# Patient Record
Sex: Female | Born: 1970 | Race: White | Hispanic: No | Marital: Single | Smoking: Former smoker
Health system: Southern US, Community
[De-identification: ages and names within clinical notes are randomized; demographics above are authoritative.]

## PROBLEM LIST (undated history)

## (undated) ENCOUNTER — Ambulatory Visit: Admission: EM | Disposition: A | Discharge status: Home / Self Care | Payer: Medicare Other

## (undated) DIAGNOSIS — R600 Localized edema: Secondary | ICD-10-CM

## (undated) DIAGNOSIS — I499 Cardiac arrhythmia, unspecified: Secondary | ICD-10-CM

## (undated) DIAGNOSIS — F410 Panic disorder [episodic paroxysmal anxiety] without agoraphobia: Secondary | ICD-10-CM

## (undated) DIAGNOSIS — E785 Hyperlipidemia, unspecified: Secondary | ICD-10-CM

## (undated) DIAGNOSIS — M797 Fibromyalgia: Secondary | ICD-10-CM

## (undated) DIAGNOSIS — M791 Myalgia, unspecified site: Secondary | ICD-10-CM

## (undated) DIAGNOSIS — F319 Bipolar disorder, unspecified: Secondary | ICD-10-CM

## (undated) DIAGNOSIS — E039 Hypothyroidism, unspecified: Secondary | ICD-10-CM

## (undated) DIAGNOSIS — N1831 Chronic kidney disease, stage 3a: Secondary | ICD-10-CM

## (undated) DIAGNOSIS — E119 Type 2 diabetes mellitus without complications: Secondary | ICD-10-CM

## (undated) DIAGNOSIS — F4310 Post-traumatic stress disorder, unspecified: Secondary | ICD-10-CM

## (undated) DIAGNOSIS — M5136 Other intervertebral disc degeneration, lumbar region: Secondary | ICD-10-CM

## (undated) DIAGNOSIS — N289 Disorder of kidney and ureter, unspecified: Secondary | ICD-10-CM

## (undated) DIAGNOSIS — N281 Cyst of kidney, acquired: Secondary | ICD-10-CM

## (undated) DIAGNOSIS — M79605 Pain in left leg: Secondary | ICD-10-CM

## (undated) DIAGNOSIS — M810 Age-related osteoporosis without current pathological fracture: Secondary | ICD-10-CM

## (undated) DIAGNOSIS — R51 Headache: Secondary | ICD-10-CM

## (undated) DIAGNOSIS — K859 Acute pancreatitis without necrosis or infection, unspecified: Secondary | ICD-10-CM

## (undated) DIAGNOSIS — D649 Anemia, unspecified: Secondary | ICD-10-CM

## (undated) DIAGNOSIS — M1990 Unspecified osteoarthritis, unspecified site: Secondary | ICD-10-CM

## (undated) DIAGNOSIS — G2581 Restless legs syndrome: Secondary | ICD-10-CM

## (undated) DIAGNOSIS — I2510 Atherosclerotic heart disease of native coronary artery without angina pectoris: Secondary | ICD-10-CM

## (undated) DIAGNOSIS — N3281 Overactive bladder: Secondary | ICD-10-CM

## (undated) DIAGNOSIS — R519 Headache, unspecified: Secondary | ICD-10-CM

## (undated) DIAGNOSIS — I10 Essential (primary) hypertension: Secondary | ICD-10-CM

## (undated) DIAGNOSIS — G4730 Sleep apnea, unspecified: Secondary | ICD-10-CM

## (undated) DIAGNOSIS — G459 Transient cerebral ischemic attack, unspecified: Secondary | ICD-10-CM

## (undated) DIAGNOSIS — E079 Disorder of thyroid, unspecified: Secondary | ICD-10-CM

## (undated) DIAGNOSIS — Z8489 Family history of other specified conditions: Secondary | ICD-10-CM

## (undated) DIAGNOSIS — I639 Cerebral infarction, unspecified: Secondary | ICD-10-CM

## (undated) DIAGNOSIS — I219 Acute myocardial infarction, unspecified: Secondary | ICD-10-CM

## (undated) DIAGNOSIS — F329 Major depressive disorder, single episode, unspecified: Secondary | ICD-10-CM

## (undated) DIAGNOSIS — F419 Anxiety disorder, unspecified: Secondary | ICD-10-CM

## (undated) DIAGNOSIS — G9050 Complex regional pain syndrome I, unspecified: Secondary | ICD-10-CM

## (undated) DIAGNOSIS — G894 Chronic pain syndrome: Secondary | ICD-10-CM

## (undated) DIAGNOSIS — J449 Chronic obstructive pulmonary disease, unspecified: Secondary | ICD-10-CM

## (undated) DIAGNOSIS — I209 Angina pectoris, unspecified: Secondary | ICD-10-CM

## (undated) DIAGNOSIS — T1491XA Suicide attempt, initial encounter: Secondary | ICD-10-CM

## (undated) DIAGNOSIS — F32A Depression, unspecified: Secondary | ICD-10-CM

## (undated) DIAGNOSIS — J45909 Unspecified asthma, uncomplicated: Secondary | ICD-10-CM

## (undated) DIAGNOSIS — M51369 Other intervertebral disc degeneration, lumbar region without mention of lumbar back pain or lower extremity pain: Secondary | ICD-10-CM

## (undated) DIAGNOSIS — T50901A Poisoning by unspecified drugs, medicaments and biological substances, accidental (unintentional), initial encounter: Secondary | ICD-10-CM

## (undated) DIAGNOSIS — I509 Heart failure, unspecified: Secondary | ICD-10-CM

## (undated) DIAGNOSIS — K219 Gastro-esophageal reflux disease without esophagitis: Secondary | ICD-10-CM

## (undated) HISTORY — DX: Cerebral infarction, unspecified: I63.9

## (undated) HISTORY — PX: COLONOSCOPY: SHX174

## (undated) HISTORY — DX: Unspecified asthma, uncomplicated: J45.909

## (undated) HISTORY — DX: Bipolar disorder, unspecified: F31.9

## (undated) HISTORY — PX: CARDIAC CATHETERIZATION: SHX172

## (undated) HISTORY — PX: ESOPHAGOGASTRODUODENOSCOPY: SHX1529

## (undated) HISTORY — DX: Headache, unspecified: R51.9

## (undated) HISTORY — DX: Sleep apnea, unspecified: G47.30

## (undated) HISTORY — DX: Hyperlipidemia, unspecified: E78.5

## (undated) HISTORY — PX: ABDOMINAL HYSTERECTOMY: SHX81

## (undated) HISTORY — DX: Chronic obstructive pulmonary disease, unspecified: J44.9

## (undated) HISTORY — PX: OTHER SURGICAL HISTORY: SHX169

## (undated) HISTORY — DX: Heart failure, unspecified: I50.9

## (undated) HISTORY — DX: Poisoning by unspecified drugs, medicaments and biological substances, accidental (unintentional), initial encounter: T50.901A

## (undated) HISTORY — DX: Complex regional pain syndrome I, unspecified: G90.50

## (undated) HISTORY — DX: Anxiety disorder, unspecified: F41.9

## (undated) HISTORY — DX: Overactive bladder: N32.81

## (undated) HISTORY — PX: ELBOW FRACTURE SURGERY: SHX616

## (undated) HISTORY — DX: Age-related osteoporosis without current pathological fracture: M81.0

## (undated) HISTORY — DX: Gastro-esophageal reflux disease without esophagitis: K21.9

## (undated) HISTORY — DX: Disorder of kidney and ureter, unspecified: N28.9

## (undated) HISTORY — DX: Headache: R51

## (undated) HISTORY — DX: Type 2 diabetes mellitus without complications: E11.9

## (undated) HISTORY — PX: TONSILLECTOMY: SUR1361

## (undated) HISTORY — DX: Anemia, unspecified: D64.9

## (undated) HISTORY — DX: Pain in left leg: M79.605

## (undated) HISTORY — DX: Myalgia, unspecified site: M79.10

## (undated) NOTE — *Deleted (*Deleted)
CARDIOLOGY CONSULT NOTE               Patient ID: Marie Clarke MRN: [id number] DOB/AGE: 01-29-1971 60 y.o.  Admit date: 12/26/2019 Referring Physician *** Primary Physician *** Primary Cardiologist *** Reason for Consultation ***  HPI: ***  Review of systems complete and found to be negative unless listed above     Past Medical History:  Diagnosis Date  . Anemia   . Anginal pain (HCC)   . Anxiety   . Arthritis   . Asthma   . Bilateral lower extremity edema   . Bipolar disorder (HCC)   . CAD (coronary artery disease) unk  . CHF (congestive heart failure) (HCC)   . Chronic pain syndrome   . COPD (chronic obstructive pulmonary disease) (HCC)    emphysema  . DDD (degenerative disc disease), lumbar   . Depression unk  . Diabetes mellitus without complication (HCC)   . Diabetes mellitus, type II (HCC)   . Drug overdose 2015   after dad died  . Dysrhythmia    history of tachy arrythmias  . Fibromyalgia   . GERD (gastroesophageal reflux disease)   . Headache    chronic migraines  . Hyperlipidemia   . Hypertension   . Hypothyroidism   . Left leg pain 04/29/2014  . MI (myocardial infarction) (HCC) 2007  . Muscle ache 09/16/2014  . Osteoporosis   . Overactive bladder   . Pancreatitis unk  . Panic attacks   . PTSD (post-traumatic stress disorder)   . Reflex sympathetic dystrophy   . Renal cyst, left   . Renal insufficiency    ckd stage iii  . Restless legs syndrome   . Sleep apnea 06/2019   uses cpap  . Sleep apnea   . Stroke (HCC) 2010   TIA x 2. no residual deficits  . Suicide attempt [facility])    after father died.  . Thyroid disease    thyroid nodule  . TIA (transient ischemic attack) unk  . TIA (transient ischemic attack)     Past Surgical History:  Procedure Laterality Date  . ABDOMINAL HYSTERECTOMY    . CARDIAC CATHETERIZATION    . HERNIA REPAIR  07/15/2017   [facility]  . lumbar facet     several  . prolapse rectum surgery N/A July 2016  . THORACIC  LAMINECTOMY FOR SPINAL CORD STIMULATOR N/A 07/27/2019   Procedure: THORACIC SPINAL CORD STIMULATOR PLACEMENT WITH RIGHT FLANK PULSE GENERATOR;  Surgeon: Lucy Chris, MD;  Location: [facility] ORS;  Service: Neurosurgery;  Laterality: N/A;  . TONSILLECTOMY      Medications Prior to Admission  Medication Sig Dispense Refill Last Dose  . Albuterol Sulfate (PROAIR RESPICLICK IN) Inhale 1-2 puffs into the lungs every 6 (six) hours as needed (wheezing or shortness of breath).     Marland Kitchen atorvastatin (LIPITOR) 40 MG tablet Take 40 mg by mouth at bedtime.      . B Complex-C (B-COMPLEX WITH VITAMIN C) tablet Take 1 tablet by mouth daily.      . benztropine (COGENTIN) 0.5 MG tablet TAKE 1 TABLET BY MOUTH TWICE A DAY FOR ABNORMAL MOVEMENTS. 60 tablet 1   . busPIRone (BUSPAR) 15 MG tablet Take 1 tablet (15 mg total) by mouth 2 (two) times daily. 180 tablet 0   . Cholecalciferol (VITAMIN D3) 125 MCG (5000 UT) TABS Take 1 tablet by mouth daily.      Marland Kitchen darifenacin (ENABLEX) 7.5 MG 24 hr tablet Take 7.5 mg by mouth at bedtime.  Use as needed for OAB     . desvenlafaxine (PRISTIQ) 50 MG 24 hr tablet Take 1 tablet (50 mg total) by mouth at bedtime. 90 tablet 0   . dicyclomine (BENTYL) 10 MG capsule Take 10 mg by mouth 3 (three) times daily.      . famotidine (PEPCID) 20 MG tablet Take 40 mg by mouth 2 (two) times daily.      . fluticasone (FLONASE) 50 MCG/ACT nasal spray Place 2 sprays into both nostrils daily as needed.      . gabapentin (NEURONTIN) 600 MG tablet Take 1 tablet (600 mg total) by mouth 3 (three) times daily. 90 tablet 0   . gabapentin (NEURONTIN) 800 MG tablet Take 1 tablet (800 mg total) by mouth at bedtime. 40 tablet 0   . glipiZIDE (GLUCOTROL XL) 10 MG 24 hr tablet Take 10 mg by mouth daily with breakfast.      . hydrOXYzine (ATARAX/VISTARIL) 25 MG tablet TAKE 1 TABLET BY MOUTH EVERY 6 HOURS AS NEEDED (Patient taking differently: Take 25 mg by mouth every 6 (six) hours as needed. ) 360 tablet 0   .  levothyroxine (SYNTHROID) 175 MCG tablet Take 175 mcg by mouth daily before breakfast.     . metoprolol succinate (TOPROL-XL) 25 MG 24 hr tablet Take 25 mg by mouth daily. Takes in the evening     . montelukast (SINGULAIR) 10 MG tablet Take 10 mg by mouth daily. Takes in the afternoon     . nitroGLYCERIN (NITROSTAT) 0.4 MG SL tablet Place 0.4 mg under the tongue every 5 (five) minutes as needed for chest pain.      Marland Kitchen omeprazole (PRILOSEC) 40 MG capsule Take 40 mg by mouth daily.      . promethazine (PHENERGAN) 25 MG tablet Take 25 mg by mouth every 6 (six) hours as needed.      Marland Kitchen QUEtiapine (SEROQUEL) 100 MG tablet Take 1 tablet (100 mg total) by mouth at bedtime. To be combined with 25 mg at bedtime 90 tablet 0   . QUEtiapine (SEROQUEL) 25 MG tablet Take 1 tablet (25 mg total) by mouth as directed. Start taking 1 tablet  with 100 mg at bedtime and 1 tablet daily as needed for agitation only 180 tablet 0   . rizatriptan (MAXALT) 10 MG tablet Take 1 tablet (10 mg total) by mouth as needed. 10 tablet 5   . sucralfate (CARAFATE) 1 g tablet Take 1 g by mouth 4 (four) times daily.     Marland Kitchen torsemide (DEMADEX) 20 MG tablet Take 1 tablet (20 mg total) by mouth 2 (two) times daily. 60 tablet 0   . TRELEGY ELLIPTA 100-62.5-25 MCG/INH AEPB Inhale 1 puff into the lungs daily. Takes at night (Patient not taking: Reported on 12/03/2019)     . TRULICITY 1.5 MG/0.5ML SOPN Inject 1.5 mg into the skin once a week. (Patient not taking: Reported on 12/03/2019)     . VENTOLIN HFA 108 (90 Base) MCG/ACT inhaler Inhale 1-2 puffs into the lungs every 6 (six) hours as needed for wheezing or shortness of breath.  (Patient not taking: Reported on 12/03/2019)     . vitamin E 400 UNIT capsule 400 Units daily.       Social History   Socioeconomic History  . Marital status: Single    Spouse name: Not on file  . Number of children: 0  . Years of education: Not on file  . Highest education level: High school graduate  Occupational History    Comment: not employed  Tobacco Use  . Smoking status: Former Smoker    Packs/day: 0.50    Types: Cigarettes    Quit date: 09/01/2018    Years since quitting: 1.3  . Smokeless tobacco: Never Used  . Tobacco comment: Patient quit smoking 09/01/2018  Vaping Use  . Vaping Use: Never used  Substance and Sexual Activity  . Alcohol use: No    Alcohol/week: 0.0 standard drinks  . Drug use: No  . Sexual activity: Not Currently  Other Topics Concern  . Not on file  Social History Narrative   Patient lives with aunt and uncle.   Social Determinants of Health   Financial Resource Strain:   . Difficulty of Paying Living Expenses: Not on file  Food Insecurity:   . Worried About Programme researcher, broadcasting/film/video in the Last Year: Not on file  . Ran Out of Food in the Last Year: Not on file  Transportation Needs:   . Lack of Transportation (Medical): Not on file  . Lack of Transportation (Non-Medical): Not on file  Physical Activity:   . Days of Exercise per Week: Not on file  . Minutes of Exercise per Session: Not on file  Stress:   . Feeling of Stress : Not on file  Social Connections:   . Frequency of Communication with Friends and Family: Not on file  . Frequency of Social Gatherings with Friends and Family: Not on file  . Attends Religious Services: Not on file  . Active Member of Clubs or Organizations: Not on file  . Attends Banker Meetings: Not on file  . Marital Status: Not on file  Intimate Partner Violence:   . Fear of Current or Ex-Partner: Not on file  . Emotionally Abused: Not on file  . Physically Abused: Not on file  . Sexually Abused: Not on file    Family History  Problem Relation Age of Onset  . Diabetes Mellitus II Mother   . CAD Mother   . Sleep apnea Mother   . Osteoarthritis Mother   . Osteoporosis Mother   . Anxiety disorder Mother   . Depression Mother   . Bipolar disorder Mother   . Bipolar disorder Father   . Hypertension  Father   . Depression Father   . Anxiety disorder Father   . Post-traumatic stress disorder Sister       Review of systems complete and found to be negative unless listed above      PHYSICAL EXAM  General: Well developed, well nourished, in no acute distress HEENT:  Normocephalic and atramatic Neck:  No JVD.  Lungs: Clear bilaterally to auscultation and percussion. Heart: HRRR . Normal S1 and S2 without gallops or murmurs.  Abdomen: Bowel sounds are positive, abdomen soft and non-tender  Msk:  Back normal, normal gait. Normal strength and tone for age. Extremities: No clubbing, cyanosis or edema.   Neuro: Alert and oriented X 3. Psych:  Good affect, responds appropriately  Labs:   Lab Results  Component Value Date   WBC 3.3 (L) 12/27/2019   HGB 12.1 12/27/2019   HCT 38.3 12/27/2019   MCV 91.6 12/27/2019   PLT 315 12/27/2019    Recent Labs  Lab 12/26/19 0856 12/26/19 0856 12/27/19 0700  NA 134*   < > 142  K 3.6   < > 3.9  CL 103   < > 107  CO2 19*   < > 22  BUN 12   < >  16  CREATININE 1.12*   < > 0.87  CALCIUM 9.0   < > 8.9  PROT 8.0  --   --   BILITOT 0.6  --   --   ALKPHOS 95  --   --   ALT 14  --   --   AST 13*  --   --   GLUCOSE 141*   < > 89   < > = values in this interval not displayed.   Lab Results  Component Value Date   TROPONINI <0.03 06/08/2018   No results found for: CHOL No results found for: HDL No results found for: LDLCALC No results found for: TRIG No results found for: CHOLHDL No results found for: LDLDIRECT    Radiology: DG Chest 2 View  Result Date: 12/02/2019 CLINICAL DATA:  Tachycardia EXAM: CHEST - 2 VIEW COMPARISON:  06/08/2018 FINDINGS: The lungs are symmetrically expanded. Minimal lingular atelectasis or infiltrate. Previously noted diffuse nodular pulmonary infiltrate has resolved. No pneumothorax or pleural effusion. Cardiac size within normal limits. Pulmonary vascularity is normal. Dorsal column stimulator leads extend  to the level of T7. No acute bone abnormality. IMPRESSION: Minimal lingular atelectasis or infiltrate. Electronically Signed   By: Helyn Numbers MD   On: 12/02/2019 09:24   DG Abd 1 View  Result Date: 12/06/2019 CLINICAL DATA:  Abdominal pain. EXAM: ABDOMEN - 1 VIEW COMPARISON:  December 05, 2019 FINDINGS: There is stable nasogastric tube and spinal stimulator wire positioning. Stable, mildly dilated small bowel loops are seen. No radio-opaque calculi or other significant radiographic abnormality are seen. IMPRESSION: Stable, mildly dilated small bowel loops consistent with a small bowel obstruction versus ileus. Electronically Signed   By: Aram Candela M.D.   On: 12/06/2019 18:33   DG Abd 1 View  Result Date: 12/05/2019 CLINICAL DATA:  Vomiting.  Multiple bowel movements. EXAM: ABDOMEN - 1 VIEW COMPARISON:  12/04/2019 FINDINGS: NG tube in stomach. No dilated large or small bowel. Stimulator device in the central canal of the spine. IMPRESSION: NG tube in stomach.  No bowel obstruction. Electronically Signed   By: Genevive Bi M.D.   On: 12/05/2019 15:43   DG Abd 1 View  Result Date: 12/04/2019 CLINICAL DATA:  Worsening pain and nausea EXAM: ABDOMEN - 1 VIEW COMPARISON:  12/04/2019, 12/02/2019, CT 12/02/2019 FINDINGS: Esophageal tube tip no longer visualized. Ascending thoracic stimulator leads. Persistent gaseous dilatation of small bowel measuring up to 4.7 cm, without significant radiographic change. Pelvic calcifications. IMPRESSION: Persistent gaseous dilatation of small bowel, not significantly changed radiograph performed earlier today. Previously noted esophageal tube is not identified. Electronically Signed   By: Jasmine Pang M.D.   On: 12/04/2019 23:41   DG Abd 1 View  Result Date: 12/04/2019 CLINICAL DATA:  Small bowel obstruction EXAM: ABDOMEN - 1 VIEW COMPARISON:  12/02/2019 FINDINGS: Focal gas-filled moderately distended small bowel in the left upper quadrant. Visualized  small bowel loops demonstrate evidence of wall thickening. Changes consistent with small bowel obstruction. Enteric tube is present in the left upper quadrant consistent with location in the stomach. Spinal stimulator device. Surgical clips in the pelvis. Degenerative changes in the spine and hips. IMPRESSION: Dilated gas-filled small bowel with evidence of wall thickening suggesting small bowel obstruction. Electronically Signed   By: Burman Nieves M.D.   On: 12/04/2019 06:58   CT Chest Wo Contrast  Result Date: 12/02/2019 CLINICAL DATA:  Tachycardia and fever, recent COVID booster shot EXAM: CT CHEST WITHOUT CONTRAST TECHNIQUE: Multidetector CT imaging  of the chest was performed following the standard protocol without IV contrast. COMPARISON:  Plain film from earlier in the same day, CT from 04/23/2015, 10/25/2019 FINDINGS: Cardiovascular: Somewhat limited due to lack of IV contrast. No significant atherosclerotic calcifications are noted. No aneurysmal dilatation is seen. No cardiac enlargement is noted. No coronary calcifications are seen. Mediastinum/Nodes: Thoracic inlet is within normal limits. No sizable hilar or mediastinal adenopathy is noted. Fluid is noted within the esophagus likely related to reflux given the degree of gastric distension. In the left axilla, there is some mild inflammatory change and minimal lymph nodes although not significant by size criteria. This likely represents reaction to the recent COVID booster shot. Correlate with the location of the recent booster. Lungs/Pleura: Lungs are well aerated bilaterally. Lingular scarring is noted stable from 2017. This corresponds to the finding seen on recent chest x-ray. Upper Abdomen: Visualized upper abdomen shows the visceral organs to be within normal limits with the exception of a small left adrenal nodule stable from the recent exam. The stomach is distended with significant amount of fluid and there is a single loop of dilated  small bowel identified centrally on the inferior aspect of the imaging plane. Comparison with recent CT examination from 10/25/2019 shows evidence of anterior abdominal wall hernia with small bowel within. This raises suspicion for possible incarceration. CT of the abdomen and pelvis without contrast is recommended. Musculoskeletal: No chest wall mass or suspicious bone lesions identified. Spinal stimulator is noted. IMPRESSION: Changes on recent chest x-ray are related to scarring unchanged from 2017. Increased inflammatory change in the region of the left axilla. This is likely related to the recent COVID booster shot given the patient's clinical history. Correlate with the injection side on the left. Distension of the stomach with fluid as well as a single loop of dilated small bowel suggestive of obstruction. Previous CT of the abdomen and pelvis demonstrates ventral hernia is and these changes are highly suspicious for small bowel incarceration. CT of the abdomen and pelvis without contrast is recommended for further evaluation. These results were called by telephone at the time of interpretation on 12/02/2019 at 4:14 pm to provider Jene Every , who verbally acknowledged these results. Electronically Signed   By: Alcide Clever M.D.   On: 12/02/2019 16:15   CT Abdomen Pelvis W Contrast  Result Date: 12/26/2019 CLINICAL DATA:  Vomiting and constipation for 24 hours. EXAM: CT ABDOMEN AND PELVIS WITH CONTRAST TECHNIQUE: Multidetector CT imaging of the abdomen and pelvis was performed using the standard protocol following bolus administration of intravenous contrast. CONTRAST:  OMNIPAQUE IOHEXOL 300 MG/ML  SOLN COMPARISON:  Abdominal radiograph 12/08/2019 and CT scan from 12/02/2019 FINDINGS: Body habitus reduces diagnostic sensitivity and specificity. Lower chest: Mild lingular scarring. Hepatobiliary: Unremarkable Pancreas: Unremarkable Spleen: Unremarkable Adrenals/Urinary Tract: 2.2 by 1.7 cm left  adrenal mass with relative washout of 24%, indeterminate. This mass previously measured 2.1 by 1.7 cm on 09/11/2016 and accordingly is roughly stable and likely benign. Mildly distended urinary bladder. 0.5 cm hypodense lesion in the left kidney lower pole on image 30 of series 4 is technically too small to characterize although statistically likely to be a small cyst or similar benign lesion. Stomach/Bowel: The stomach is moderately distended. Small bowel dilatation extending to a complex right paracentral anterior abdominal wall hernia. There 3 components of this hernia. The first is in upper component shown on image 74 of series 7 containing only adipose tissue, and thus not contributing to the  obstruction. Just below this, there is a component with a herniated loop of small bowel which appears mildly dilated. This dilated segment of small bowel extends back into the abdomen, tracks caudad, and extends out through a third right paracentral hernia. The distal portion of the loop then extends back into the abdomen where it assumes a normal caliber. Accordingly the transition appears to be in this bottom most component of the hernia. There is no extraluminal gas, pneumatosis, abscess, or evidence of perforation. Proximal small bowel loops measure up to 4.9 cm in diameter. The distal small bowel loops are of normal caliber. There is formed stool in the colon. Unremarkable appendix. Anastomotic staple line in the sigmoid colon. Vascular/Lymphatic: Unremarkable Reproductive: Uterus absent.  Adnexa unremarkable. Other: Scattered small mesenteric lymph nodes are likely reactive. Musculoskeletal: Bilateral degenerative hip arthropathy. Dorsal column stimulator noted. The leads are at the T7 through T10 levels. Pelvic floor laxity with low position of the anorectal junction. Hemangioma in the L2 vertebral body. IMPRESSION: 1. Small bowel obstruction due to a complex right paracentral anterior abdominal wall hernia  containing 3 components. The top component contains adipose tissue only, the middle component contains a loop of small bowel, and this same loop of small bowel extends back into the abdomen and into the lower hernia component. The transition in caliber appears to be in this bottom most component of the hernia. No extraluminal gas, pneumatosis, or abscess. 2. Pelvic floor laxity with low position of the anorectal junction. 3. Bilateral degenerative hip arthropathy. 4. 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 Electronically Signed   By: Gaylyn Rong M.D.   On: 12/26/2019 12:23   CT ABDOMEN PELVIS W CONTRAST  Result Date: 12/02/2019 CLINICAL DATA:  Nausea, tachycardia, fever, unspecified abdominal pain EXAM: CT ABDOMEN AND PELVIS WITH CONTRAST TECHNIQUE: Multidetector CT imaging of the abdomen and pelvis was performed using the standard protocol following bolus administration of intravenous contrast. CONTRAST:  OMNIPAQUE IOHEXOL 300 MG/ML  SOLN COMPARISON:  10/25/2019 FINDINGS: Lower chest: Discoid atelectasis at the left lung base. The visualized heart and pericardium are unremarkable. The distal esophagus appears fluid-filled suggesting changes of gastroesophageal reflux or esophageal dysmotility. Hepatobiliary: Moderate hepatic steatosis. No focal liver lesion. Gallbladder unremarkable. No intra or extrahepatic biliary ductal dilation. Pancreas: Unremarkable Spleen: Unremarkable Adrenals/Urinary Tract: Slight interval increase in left adrenal nodule, now measuring 22 mm, previously measuring 18 mm on remote prior examination of 04/26/2016. This is indeterminate and differential considerations include adrenal adenoma or pheochromocytoma. Right adrenal gland is unremarkable. Kidneys are unremarkable. Bladder is unremarkable. Stomach/Bowel: A distal small bowel obstruction is present with multiple fluid-filled  dilated loops of small bowel as well as fluid distension of the stomach. As noted previously, a tiny fat containing ventral hernia and slightly inferior parasagittal ventral hernia containing a single sidewall of small bowel are identified. The previously noted spigelian hernia, is no longer visualized suggesting interval repair. The bone transition of the small bowel is inferior to the expected location of the spigelian hernia, best seen on axial image # 61/2 and sagittal image # 55/6 suggesting a a adhesion in this location. All bowel demonstrates normal mural enhancement. No significant mesenteric edema identified. No free intraperitoneal gas or fluid. The ileum is decompressed. Surgical changes of sigmoid colectomy are identified. The large bowel is otherwise unremarkable. Appendix is unremarkable. Vascular/Lymphatic: No significant vascular findings are present. No enlarged abdominal or pelvic lymph nodes.  Reproductive: Status post hysterectomy. No adnexal masses. Other: Rectum unremarkable. Healed laparotomy incision noted. Dorsal column stimulator leads are seen extending to the level of T7 with the battery pack seen within the subcutaneous fat of the right gluteal region. Musculoskeletal: Degenerative changes are seen within the lumbar spine. No lytic or blastic bone lesions are seen. IMPRESSION: Small-bowel obstruction with point of transition in the region of the previously noted spigelian hernia suggesting an adhesion as an underlying cause. Fluid-filled proximal small bowel and stomach with fluid within the esophagus in keeping with gastroesophageal reflux. Electronically Signed   By: Helyn Numbers MD   On: 12/02/2019 17:45   DG Abd Portable 1V-Small Bowel Obstruction Protocol-initial, 8 hr delay  Result Date: 12/27/2019 CLINICAL DATA:  Small-bowel obstruction EXAM: PORTABLE ABDOMEN - 1 VIEW COMPARISON:  CT 12/26/2019 FINDINGS: Multiple gas-filled dilated loops of small bowel are seen within the  mid abdomen. There is gas noted within a nondistended colon. These findings are compatible with a CT examination performed earlier. Administered oral contrast remains largely within the stomach. No gross free intraperitoneal gas. Surgical clips and surgical anastomotic staple line noted within the pelvis. Dorsal column stimulator leads again noted. IMPRESSION: Stable findings of a small-bowel obstruction, similar to prior CT examination. Administered oral contrast remains largely within the stomach. Electronically Signed   By: Helyn Numbers MD   On: 12/27/2019 02:00   DG Abd Portable 1V-Small Bowel Obstruction Protocol-24 hr delay  Result Date: 12/08/2019 CLINICAL DATA:  Small bowel obstruction EXAM: PORTABLE ABDOMEN - 1 VIEW COMPARISON:  December 07, 2019 and December 06, 2019 radiograph; CT abdomen and pelvis December 02, 2019 FINDINGS: Nasogastric tube tip and side port are in the stomach. There is moderate stool in the colon. There is no bowel dilatation or air-fluid level to suggest bowel obstruction. No free air. There are surgical clips in left pelvis. A stimulator overlies the right abdomen with stimulator lead tips in the lower thoracic region. Lung bases clear. IMPRESSION: Nasogastric tube tip and side port in stomach. No findings indicative of bowel obstruction or free air. Thoracic stimulator leads in lower thoracic region. Surgical clips left pelvis. Electronically Signed   By: Bretta Bang III M.D.   On: 12/08/2019 14:08   DG Abd Portable 1V-Small Bowel Obstruction Protocol-initial, 8 hr delay  Result Date: 12/07/2019 CLINICAL DATA:  Delay for small-bowel obstruction EXAM: PORTABLE ABDOMEN - 1 VIEW COMPARISON:  None. FINDINGS: The bowel gas pattern is normal. Contrast is seen throughout the colon to the level of the sigmoid rectal junction. NG tube is seen within the mid body of the stomach. No radio-opaque calculi or other significant radiographic abnormality are seen. IMPRESSION:  Contrast to level of the sigmoid rectal junction. No dilated loops of bowel. Electronically Signed   By: Jonna Clark M.D.   On: 12/07/2019 20:47   DG Abd Portable 1 View  Result Date: 12/02/2019 CLINICAL DATA:  NG tube placement EXAM: PORTABLE ABDOMEN - 1 VIEW COMPARISON:  None. FINDINGS: NG tube tip is within the stomach. IMPRESSION: NG tube in the stomach. Electronically Signed   By: Charlett Nose M.D.   On: 12/02/2019 19:14    EKG: ***  ASSESSMENT AND PLAN:  ***  Signed: Alwyn Pea MD 12/27/2019, 11:44 AM

---

## 2005-02-12 DIAGNOSIS — I219 Acute myocardial infarction, unspecified: Secondary | ICD-10-CM

## 2005-02-12 HISTORY — DX: Acute myocardial infarction, unspecified: I21.9

## 2008-02-13 DIAGNOSIS — I639 Cerebral infarction, unspecified: Secondary | ICD-10-CM

## 2008-02-13 HISTORY — DX: Cerebral infarction, unspecified: I63.9

## 2013-02-12 DIAGNOSIS — T50901A Poisoning by unspecified drugs, medicaments and biological substances, accidental (unintentional), initial encounter: Secondary | ICD-10-CM

## 2013-02-12 HISTORY — DX: Poisoning by unspecified drugs, medicaments and biological substances, accidental (unintentional), initial encounter: T50.901A

## 2013-10-23 ENCOUNTER — Emergency Department: Payer: Self-pay | Admitting: Internal Medicine

## 2013-11-23 ENCOUNTER — Ambulatory Visit: Payer: Self-pay

## 2013-11-24 ENCOUNTER — Emergency Department: Payer: Self-pay | Admitting: Emergency Medicine

## 2013-11-25 ENCOUNTER — Emergency Department: Payer: Self-pay | Admitting: Emergency Medicine

## 2013-11-25 LAB — BASIC METABOLIC PANEL
ANION GAP: 9 (ref 7–16)
BUN: 20 mg/dL — ABNORMAL HIGH (ref 7–18)
CALCIUM: 8.6 mg/dL (ref 8.5–10.1)
CO2: 24 mmol/L (ref 21–32)
Chloride: 109 mmol/L — ABNORMAL HIGH (ref 98–107)
Creatinine: 0.96 mg/dL (ref 0.60–1.30)
EGFR (African American): >60
EGFR (Non-African Amer.): >60
GLUCOSE: 97 mg/dL (ref 65–99)
Osmolality: 286 (ref 275–301)
Potassium: 3.5 mmol/L (ref 3.5–5.1)
Sodium: 142 mmol/L (ref 136–145)

## 2013-11-25 LAB — CBC
HCT: 37.1 % (ref 35.0–47.0)
HGB: 11.7 g/dL — AB (ref 12.0–16.0)
MCH: 28.4 pg (ref 26.0–34.0)
MCHC: 31.4 g/dL — ABNORMAL LOW (ref 32.0–36.0)
MCV: 90 fL (ref 80–100)
PLATELETS: 210 10*3/uL (ref 150–440)
RBC: 4.11 10*6/uL (ref 3.80–5.20)
RDW: 15.4 % — AB (ref 11.5–14.5)
WBC: 6.7 10*3/uL (ref 3.6–11.0)

## 2013-11-25 LAB — TROPONIN I: Troponin-I: <0.02 ng/mL

## 2013-11-25 LAB — D-DIMER(ARMC): D-Dimer: 495 ng/ml

## 2013-12-03 DIAGNOSIS — E119 Type 2 diabetes mellitus without complications: Secondary | ICD-10-CM | POA: Insufficient documentation

## 2013-12-03 DIAGNOSIS — I10 Essential (primary) hypertension: Secondary | ICD-10-CM | POA: Insufficient documentation

## 2013-12-03 DIAGNOSIS — E039 Hypothyroidism, unspecified: Secondary | ICD-10-CM | POA: Insufficient documentation

## 2013-12-14 DIAGNOSIS — N183 Chronic kidney disease, stage 3 unspecified: Secondary | ICD-10-CM | POA: Insufficient documentation

## 2014-01-05 ENCOUNTER — Ambulatory Visit: Payer: Self-pay

## 2014-01-14 ENCOUNTER — Ambulatory Visit: Payer: Self-pay | Admitting: Pain Medicine

## 2014-02-17 ENCOUNTER — Emergency Department: Payer: Self-pay | Admitting: Student

## 2014-02-17 LAB — CBC
HCT: 38.3 % (ref 35.0–47.0)
HGB: 12.4 g/dL (ref 12.0–16.0)
MCH: 29.1 pg (ref 26.0–34.0)
MCHC: 32.4 g/dL (ref 32.0–36.0)
MCV: 90 fL (ref 80–100)
Platelet: 206 10*3/uL (ref 150–440)
RBC: 4.26 10*6/uL (ref 3.80–5.20)
RDW: 15.2 % — ABNORMAL HIGH (ref 11.5–14.5)
WBC: 9 10*3/uL (ref 3.6–11.0)

## 2014-02-17 LAB — BASIC METABOLIC PANEL
Anion Gap: 9 (ref 7–16)
BUN: 21 mg/dL — ABNORMAL HIGH (ref 7–18)
CALCIUM: 8.6 mg/dL (ref 8.5–10.1)
CHLORIDE: 112 mmol/L — AB (ref 98–107)
CO2: 24 mmol/L (ref 21–32)
CREATININE: 1.09 mg/dL (ref 0.60–1.30)
EGFR (African American): >60
EGFR (Non-African Amer.): 59 — ABNORMAL LOW
Glucose: 88 mg/dL (ref 65–99)
Osmolality: 291 (ref 275–301)
Potassium: 3.4 mmol/L — ABNORMAL LOW (ref 3.5–5.1)
SODIUM: 145 mmol/L (ref 136–145)

## 2014-02-17 LAB — TROPONIN I

## 2014-02-21 ENCOUNTER — Emergency Department: Payer: Self-pay | Admitting: Emergency Medicine

## 2014-02-22 ENCOUNTER — Ambulatory Visit: Payer: Self-pay | Admitting: Family Medicine

## 2014-02-22 LAB — BASIC METABOLIC PANEL
ANION GAP: 11 (ref 7–16)
BUN: 25 mg/dL — ABNORMAL HIGH (ref 7–18)
CALCIUM: 8.7 mg/dL (ref 8.5–10.1)
CHLORIDE: 111 mmol/L — AB (ref 98–107)
CREATININE: 1.13 mg/dL (ref 0.60–1.30)
Co2: 19 mmol/L — ABNORMAL LOW (ref 21–32)
EGFR (African American): >60
EGFR (Non-African Amer.): 56 — ABNORMAL LOW
Glucose: 101 mg/dL — ABNORMAL HIGH (ref 65–99)
Osmolality: 286 (ref 275–301)
Potassium: 2.9 mmol/L — ABNORMAL LOW (ref 3.5–5.1)
Sodium: 141 mmol/L (ref 136–145)

## 2014-02-22 LAB — CBC
HCT: 38.1 % (ref 35.0–47.0)
HGB: 12.3 g/dL (ref 12.0–16.0)
MCH: 29 pg (ref 26.0–34.0)
MCHC: 32.4 g/dL (ref 32.0–36.0)
MCV: 90 fL (ref 80–100)
Platelet: 223 10*3/uL (ref 150–440)
RBC: 4.26 10*6/uL (ref 3.80–5.20)
RDW: 14.9 % — AB (ref 11.5–14.5)
WBC: 13.2 10*3/uL — ABNORMAL HIGH (ref 3.6–11.0)

## 2014-02-22 LAB — TROPONIN I: Troponin-I: <0.02 ng/mL

## 2014-02-23 ENCOUNTER — Ambulatory Visit: Payer: Self-pay | Admitting: Family Medicine

## 2014-04-03 ENCOUNTER — Emergency Department: Payer: Self-pay | Admitting: Emergency Medicine

## 2014-04-13 DIAGNOSIS — M8900 Algoneurodystrophy, unspecified site: Secondary | ICD-10-CM | POA: Insufficient documentation

## 2014-04-29 DIAGNOSIS — Z79899 Other long term (current) drug therapy: Secondary | ICD-10-CM | POA: Insufficient documentation

## 2014-04-29 DIAGNOSIS — M79605 Pain in left leg: Secondary | ICD-10-CM

## 2014-04-29 HISTORY — DX: Pain in left leg: M79.605

## 2014-07-19 ENCOUNTER — Emergency Department
Admission: EM | Admit: 2014-07-19 | Discharge: 2014-07-19 | Disposition: A | Discharge status: Home / Self Care | Payer: Medicare Other | Attending: Emergency Medicine | Admitting: Emergency Medicine

## 2014-07-19 ENCOUNTER — Encounter: Payer: Self-pay | Admitting: Emergency Medicine

## 2014-07-19 DIAGNOSIS — E119 Type 2 diabetes mellitus without complications: Secondary | ICD-10-CM | POA: Diagnosis not present

## 2014-07-19 DIAGNOSIS — K623 Rectal prolapse: Secondary | ICD-10-CM

## 2014-07-19 DIAGNOSIS — I10 Essential (primary) hypertension: Secondary | ICD-10-CM | POA: Diagnosis not present

## 2014-07-19 DIAGNOSIS — K625 Hemorrhage of anus and rectum: Secondary | ICD-10-CM | POA: Diagnosis present

## 2014-07-19 DIAGNOSIS — K6289 Other specified diseases of anus and rectum: Secondary | ICD-10-CM

## 2014-07-19 HISTORY — DX: Depression, unspecified: F32.A

## 2014-07-19 HISTORY — DX: Acute pancreatitis without necrosis or infection, unspecified: K85.90

## 2014-07-19 HISTORY — DX: Type 2 diabetes mellitus without complications: E11.9

## 2014-07-19 HISTORY — DX: Atherosclerotic heart disease of native coronary artery without angina pectoris: I25.10

## 2014-07-19 HISTORY — DX: Essential (primary) hypertension: I10

## 2014-07-19 HISTORY — DX: Major depressive disorder, single episode, unspecified: F32.9

## 2014-07-19 HISTORY — DX: Disorder of thyroid, unspecified: E07.9

## 2014-07-19 HISTORY — DX: Transient cerebral ischemic attack, unspecified: G45.9

## 2014-07-19 LAB — URINALYSIS COMPLETE WITH MICROSCOPIC (ARMC ONLY)
BACTERIA UA: NONE SEEN
Bilirubin Urine: NEGATIVE
GLUCOSE, UA: NEGATIVE mg/dL
Hgb urine dipstick: NEGATIVE
KETONES UR: NEGATIVE mg/dL
Leukocytes, UA: NEGATIVE
Nitrite: NEGATIVE
PH: 5 (ref 5.0–8.0)
Protein, ur: NEGATIVE mg/dL
SPECIFIC GRAVITY, URINE: 1.009 (ref 1.005–1.030)

## 2014-07-19 LAB — CBC WITH DIFFERENTIAL/PLATELET
BASOS ABS: 0 10*3/uL (ref 0–0.1)
BASOS PCT: 0 %
Eosinophils Absolute: 0.1 10*3/uL (ref 0–0.7)
Eosinophils Relative: 1 %
HCT: 33.7 % — ABNORMAL LOW (ref 35.0–47.0)
HEMOGLOBIN: 11.1 g/dL — AB (ref 12.0–16.0)
LYMPHS PCT: 30 %
Lymphs Abs: 2.1 10*3/uL (ref 1.0–3.6)
MCH: 29.7 pg (ref 26.0–34.0)
MCHC: 33 g/dL (ref 32.0–36.0)
MCV: 90 fL (ref 80.0–100.0)
Monocytes Absolute: 0.4 10*3/uL (ref 0.2–0.9)
Monocytes Relative: 5 %
Neutro Abs: 4.3 10*3/uL (ref 1.4–6.5)
Neutrophils Relative %: 64 %
PLATELETS: 185 10*3/uL (ref 150–440)
RBC: 3.74 MIL/uL — AB (ref 3.80–5.20)
RDW: 14.4 % (ref 11.5–14.5)
WBC: 6.8 10*3/uL (ref 3.6–11.0)

## 2014-07-19 LAB — COMPREHENSIVE METABOLIC PANEL
ALK PHOS: 87 U/L (ref 38–126)
ALT: 15 U/L (ref 14–54)
ANION GAP: 8 (ref 5–15)
AST: 18 U/L (ref 15–41)
Albumin: 3.7 g/dL (ref 3.5–5.0)
BUN: 15 mg/dL (ref 6–20)
CHLORIDE: 108 mmol/L (ref 101–111)
CO2: 25 mmol/L (ref 22–32)
Calcium: 8.7 mg/dL — ABNORMAL LOW (ref 8.9–10.3)
Creatinine, Ser: 0.99 mg/dL (ref 0.44–1.00)
GFR calc Af Amer: >60 mL/min (ref >60)
Glucose, Bld: 126 mg/dL — ABNORMAL HIGH (ref 65–99)
Potassium: 3.4 mmol/L — ABNORMAL LOW (ref 3.5–5.1)
SODIUM: 141 mmol/L (ref 135–145)
Total Bilirubin: 0.3 mg/dL (ref 0.3–1.2)
Total Protein: 6.8 g/dL (ref 6.5–8.1)

## 2014-07-19 LAB — LIPASE, BLOOD: Lipase: 50 U/L (ref 22–51)

## 2014-07-19 MED ORDER — HYDROCODONE-ACETAMINOPHEN 5-325 MG PO TABS: 1.0000 | ORAL_TABLET | ORAL | Status: DC | PRN

## 2014-07-19 NOTE — ED Notes (Signed)
Pt states that she is having rectal bleeding and "has to push her bowels back up" when she has a bowel movement. States she had surgery for the sam 2 years ago and these symptoms are the same as before her surgery. Pt states she has blood in her stool "like a woman on her cycle." She denies any n/v but states she has had diarrhea for last 2 days as well. Denies abdominal pain but states she is having rectal pain. Pt alert & oriented with warm, dry skin.

## 2014-07-19 NOTE — ED Notes (Signed)
Pt was triaged by L.Cisco Kindt RN

## 2014-07-19 NOTE — Discharge Instructions (Signed)
We agree with you that he most likely have a reoccurrence of your rectal prolapse. Continue to reduce the prolapse as needed. If you are unable to push the rectum back up into its proper position return to the emergency department. Follow-up with Dr. Pat Patrick or his associates for further care. Return to the emergency department if you have increasing bleeding, uncontrolled pain, or other urgent concerns.

## 2014-07-19 NOTE — ED Notes (Signed)
States she noticed some blood in stool 2 days ago.  Noticed again today

## 2014-07-19 NOTE — ED Provider Notes (Signed)
[facility] Emergency Department Provider Note  ____________________________________________  Time seen: 2015  I have reviewed the triage vital signs and the nursing notes.   HISTORY  Chief Complaint Rectal Bleeding  rectal prolapse    HPI Marie Clarke is a 44 y.o. female with a history of prior rectal prolapse and surgical repair. She reports she thinks that the repair has now failed. She began having a bulge and rectal bleeding 2 days ago. This is continued and the area is sore and uncomfortable. She reports the rectum bulges back out after each bowel movement. She is unable to push the rectum back up to its normal position but is getting increasingly uncomfortable. She denies any abdominal pain. She denies any nausea or vomiting.  The patient's prior was done in [place] where she used to live. She moved to this area over one year ago.     Past Medical History  Diagnosis Date  . CAD (coronary artery disease) unk  . Diabetes mellitus without complication   . Hypertension   . Depression unk  . Pancreatitis unk  . Thyroid disease   . TIA (transient ischemic attack) unk    There are no active problems to display for this patient.   Past Surgical History  Procedure Laterality Date  . Prolapse rectum surgery N/A   . Abdominal hysterectomy      Current Outpatient Rx  Name  Route  Sig  Dispense  Refill  . HYDROcodone-acetaminophen (NORCO) 5-325 MG per tablet   Oral   Take 1 tablet by mouth every 4 (four) hours as needed for moderate pain.   10 tablet   0     Allergies Review of patient's allergies indicates not on file.  History reviewed. No pertinent family history.  Social History History  Substance Use Topics  . Smoking status: Former Research scientist (life sciences)  . Smokeless tobacco: Not on file  . Alcohol Use: No    Review of Systems  Constitutional: Negative for fever. ENT: Negative for sore throat. Cardiovascular: Negative for chest  pain. Respiratory: Negative for shortness of breath. Gastrointestinal: Negative for abdominal pain, vomiting and diarrhea. Positive for blood per rectum and with a bulge consistent with rectal prolapse. See history of present illness Genitourinary: Negative for dysuria. Musculoskeletal: Negative for back pain. Skin: Negative for rash. Neurological: Negative for headaches   10-point ROS otherwise negative.  ____________________________________________   PHYSICAL EXAM:  VITAL SIGNS: ED Triage Vitals  Enc Vitals Group     BP 07/19/14 1816 101/61 mmHg     Pulse Rate 07/19/14 1816 87     Resp 07/19/14 1816 18     Temp 07/19/14 1816 97.8 F (36.6 C)     Temp Source 07/19/14 1816 Oral     SpO2 07/19/14 1816 98 %     Weight 07/19/14 1816 225 lb (102.059 kg)     Height 07/19/14 1816 5\' 4"  (1.626 m)     Head Cir --      Peak Flow --      Pain Score 07/19/14 1809 8     Pain Loc --      Pain Edu? --      Excl. in [place]? --     Constitutional: Alert and oriented. Well appearing, nervous but in no distress. ENT   Head: Normocephalic and atraumatic.   Nose: No congestion/rhinnorhea.   Mouth/Throat: Mucous membranes are moist. Cardiovascular: Normal rate, regular rhythm. Respiratory: Normal respiratory effort without tachypnea. Breath sounds are clear and  equal bilaterally. No wheezes/rales/rhonchi. Gastrointestinal: Soft and nontender. No distention.  Rectal: At this time there is no prolapse present. The area is mildly tender and looks slightly red. She does have gross blood present. It is bright. There is not a great deal. There is no melena Musculoskeletal: Nontender with normal range of motion in all extremities.  No noted edema. Neurologic:  Normal speech and language. No gross focal neurologic deficits are appreciated.  Skin:  Skin is warm, dry. No rash noted. Psychiatric: Mood and affect are normal. Speech and behavior are normal.   ____________________________________________    LABS (pertinent positives/negatives)  White blood cell count 6.8, hemoglobin 35.4 Metabolic panel within normal limits except for a slightly elevated glucose at 126 Lipase 50, LFTs within normal limits Urinalysis is negative for blood or infection.  ____________________________________________ ____________________________________________   INITIAL IMPRESSION / ASSESSMENT AND PLAN / ED COURSE  Patient suspects that she has a reoccurrence of her rectal prolapse and the history she provides is consistent with that. She had a bowel movement for she came to the emergency department at that time had pushed the rectum back up into its proper spot. She has not prolapsed at this moment.   I will provide her with a referral for general surgery for follow-up.  With some discomfort that she is having I will prescribe Vicodin, 10 tabs, as requested by the patient.   ____________________________________________   FINAL CLINICAL IMPRESSION(S) / ED DIAGNOSES  Final diagnoses:  Rectal prolapse  Rectal pain      Ahmed Prima, MD 07/19/14 2044

## 2014-07-20 ENCOUNTER — Encounter: Payer: Self-pay | Admitting: *Deleted

## 2014-07-20 ENCOUNTER — Emergency Department
Admission: EM | Admit: 2014-07-20 | Discharge: 2014-07-20 | Disposition: A | Discharge status: Home / Self Care | Payer: Medicare Other | Attending: Emergency Medicine | Admitting: Emergency Medicine

## 2014-07-20 ENCOUNTER — Telehealth: Payer: Self-pay | Admitting: Surgery

## 2014-07-20 DIAGNOSIS — K623 Rectal prolapse: Secondary | ICD-10-CM | POA: Diagnosis not present

## 2014-07-20 DIAGNOSIS — E119 Type 2 diabetes mellitus without complications: Secondary | ICD-10-CM | POA: Diagnosis not present

## 2014-07-20 DIAGNOSIS — Z87891 Personal history of nicotine dependence: Secondary | ICD-10-CM | POA: Diagnosis not present

## 2014-07-20 DIAGNOSIS — F419 Anxiety disorder, unspecified: Secondary | ICD-10-CM | POA: Diagnosis not present

## 2014-07-20 DIAGNOSIS — I10 Essential (primary) hypertension: Secondary | ICD-10-CM | POA: Diagnosis not present

## 2014-07-20 DIAGNOSIS — K6289 Other specified diseases of anus and rectum: Secondary | ICD-10-CM | POA: Diagnosis present

## 2014-07-20 LAB — COMPREHENSIVE METABOLIC PANEL
ALK PHOS: 88 U/L (ref 38–126)
ALT: 15 U/L (ref 14–54)
ANION GAP: 6 (ref 5–15)
AST: 16 U/L (ref 15–41)
Albumin: 3.6 g/dL (ref 3.5–5.0)
BUN: 15 mg/dL (ref 6–20)
CHLORIDE: 114 mmol/L — AB (ref 101–111)
CO2: 23 mmol/L (ref 22–32)
CREATININE: 0.96 mg/dL (ref 0.44–1.00)
Calcium: 8.7 mg/dL — ABNORMAL LOW (ref 8.9–10.3)
GFR calc Af Amer: >60 mL/min (ref >60)
GFR calc non Af Amer: >60 mL/min (ref >60)
GLUCOSE: 114 mg/dL — AB (ref 65–99)
Potassium: 3.7 mmol/L (ref 3.5–5.1)
SODIUM: 143 mmol/L (ref 135–145)
TOTAL PROTEIN: 6.8 g/dL (ref 6.5–8.1)
Total Bilirubin: 0.2 mg/dL — ABNORMAL LOW (ref 0.3–1.2)

## 2014-07-20 LAB — CBC WITH DIFFERENTIAL/PLATELET
Basophils Absolute: 0 10*3/uL (ref 0–0.1)
Basophils Relative: 0 %
EOS ABS: 0.1 10*3/uL (ref 0–0.7)
EOS PCT: 1 %
HCT: 34.1 % — ABNORMAL LOW (ref 35.0–47.0)
Hemoglobin: 11.1 g/dL — ABNORMAL LOW (ref 12.0–16.0)
Lymphocytes Relative: 32 %
Lymphs Abs: 2.1 10*3/uL (ref 1.0–3.6)
MCH: 29.7 pg (ref 26.0–34.0)
MCHC: 32.7 g/dL (ref 32.0–36.0)
MCV: 90.8 fL (ref 80.0–100.0)
MONO ABS: 0.4 10*3/uL (ref 0.2–0.9)
Monocytes Relative: 6 %
NEUTROS PCT: 61 %
Neutro Abs: 3.9 10*3/uL (ref 1.4–6.5)
PLATELETS: 167 10*3/uL (ref 150–440)
RBC: 3.75 MIL/uL — ABNORMAL LOW (ref 3.80–5.20)
RDW: 14.2 % (ref 11.5–14.5)
WBC: 6.5 10*3/uL (ref 3.6–11.0)

## 2014-07-20 MED ORDER — OXYCODONE HCL 5 MG PO TABS: 5.0000 mg | ORAL_TABLET | Freq: Three times a day (TID) | ORAL | Status: DC | PRN

## 2014-07-20 MED ORDER — MORPHINE SULFATE 4 MG/ML IJ SOLN
INTRAMUSCULAR | Status: AC
  Administered 2014-07-20: 4 mg via INTRAVENOUS
  Filled 2014-07-20: qty 1

## 2014-07-20 MED ORDER — ONDANSETRON HCL 4 MG/2ML IJ SOLN
4.0000 mg | Freq: Once | INTRAMUSCULAR | Status: AC
  Administered 2014-07-20: 4 mg via INTRAVENOUS

## 2014-07-20 MED ORDER — POLYETHYLENE GLYCOL 3350 17 G PO PACK: 17.0000 g | PACK | Freq: Every day | ORAL | Status: DC

## 2014-07-20 MED ORDER — ONDANSETRON HCL 4 MG/2ML IJ SOLN
INTRAMUSCULAR | Status: AC
  Administered 2014-07-20: 4 mg via INTRAVENOUS
  Filled 2014-07-20: qty 2

## 2014-07-20 MED ORDER — MORPHINE SULFATE 4 MG/ML IJ SOLN
4.0000 mg | Freq: Once | INTRAMUSCULAR | Status: AC
Start: 2014-07-20 — End: 2014-07-20
  Administered 2014-07-20: 4 mg via INTRAVENOUS

## 2014-07-20 NOTE — Consult Note (Signed)
Patient underwent rectal prolapse repair of some type in [place] 2 years ago and was fine until 3 days ago. Since then she has been experiencing rectal prolapse with bowel movements. She presented to the emergency department but since then has pushed her prolapse back in manually. I explained to her that I did not feel that a non-colorectal surgeon would give her the same quality care as a dedicated colorectal surgeon would. I advised emergency department physician to consider placing her on MiraLAX so that she could continue to have bowel movements regularly until she could be seen by a colorectal surgeon, and he plans to arrange just such follow-up for her.

## 2014-07-20 NOTE — ED Notes (Signed)
Pt brought in via ems from home with a prolapsed bowel   Pt seen in [facility] er yesterday with similar sx.  Pt states the pain is worse with bleeding in rectum.

## 2014-07-20 NOTE — Telephone Encounter (Signed)
Pt scheduled for appt with Dr. Leanora Cover on 07/29/14.

## 2014-07-20 NOTE — ED Notes (Signed)
Dr Leanora Cover in with pt.

## 2014-07-20 NOTE — ED Notes (Signed)
AAOx3 skin warm and dry.  NAD.  Verbalizes understanding of discharge instructions.

## 2014-07-20 NOTE — ED Notes (Signed)
Pt brought in via ems from home with prolapse bowel.  Pt was seen in er yesterday with similar sx, return to today with increased pain and prolapse.  Pt has bleeding from rectum, small amount.  No abd pain.  No n/v.  md at bedside on arrival.

## 2014-07-20 NOTE — ED Provider Notes (Signed)
[facility] Emergency Department Provider Note  ____________________________________________  Time seen: 5:30 PM  I have reviewed the triage vital signs and the nursing notes.   HISTORY  Chief Complaint Rectal Pain    HPI Marie Clarke is a 44 y.o. female presents with rectal prolapse. She reports a history of rectal prolapse which was surgically repaired approximately one year ago but over the last 3 days she has been having prolapse with each bowel movement. She reports she is able to push it back inbut it is becoming increasingly painful. She was seen yesterday in the emergency department and had outpatient follow-up arranged. She reports it is too painful and she cannot wait. She denies fevers chills. No nausea no vomiting. She is unable to tell me the exact procedure she had. She notes the pain is severe and describes it as cramping.     Past Medical History  Diagnosis Date  . CAD (coronary artery disease) unk  . Diabetes mellitus without complication   . Hypertension   . Depression unk  . Pancreatitis unk  . Thyroid disease   . TIA (transient ischemic attack) unk    There are no active problems to display for this patient.   Past Surgical History  Procedure Laterality Date  . Prolapse rectum surgery N/A   . Abdominal hysterectomy      Current Outpatient Rx  Name  Route  Sig  Dispense  Refill  . HYDROcodone-acetaminophen (NORCO) 5-325 MG per tablet   Oral   Take 1 tablet by mouth every 4 (four) hours as needed for moderate pain.   10 tablet   0     Allergies Review of patient's allergies indicates not on file.  No family history on file.  Social History History  Substance Use Topics  . Smoking status: Former Research scientist (life sciences)  . Smokeless tobacco: Not on file  . Alcohol Use: No    Review of Systems  Constitutional: Negative for fever. Eyes: Negative for visual changes. ENT: Negative for sore throat Cardiovascular: Negative for chest  pain. Respiratory: Negative for shortness of breath. Gastrointestinal: Positive for rectal pain, negative for abdominal pain. Genitourinary: Negative for dysuria. Musculoskeletal: Negative for back pain. Skin: Negative for rash. Neurological: Negative for headaches or focal weakness Psychiatric: Anxious  10-point ROS otherwise negative.  ____________________________________________   PHYSICAL EXAM:  VITAL SIGNS: ED Triage Vitals  Enc Vitals Group     BP 07/20/14 1716 100/67 mmHg     Pulse Rate 07/20/14 1716 81     Resp 07/20/14 1716 20     Temp 07/20/14 1716 98.6 F (37 C)     Temp Source 07/20/14 1716 Oral     SpO2 07/20/14 1716 99 %     Weight 07/20/14 1716 225 lb (102.059 kg)     Height 07/20/14 1716 5\' 3"  (1.6 m)     Head Cir --      Peak Flow --      Pain Score 07/20/14 1718 10     Pain Loc --      Pain Edu? --      Excl. in [place]? --      Constitutional: Alert and oriented. Well appearing, but uncomfortable and anxious Eyes: Conjunctivae are normal. PERRL. ENT   Head: Normocephalic and atraumatic.   Nose: No rhinnorhea.   Mouth/Throat: Mucous membranes are moist. Cardiovascular: Normal rate, regular rhythm. Normal and symmetric distal pulses are present in all extremities. No murmurs, rubs, or gallops. Respiratory: Normal respiratory effort  without tachypnea nor retractions. Breath sounds are clear and equal bilaterally.  Gastrointestinal: Soft and non-tender in all quadrants. No distention. There is no CVA tenderness. Patient didn't have a bowel movement and appears to have full-thickness rectal prolapse which she was able to reduce herself Genitourinary: deferred Musculoskeletal: Nontender with normal range of motion in all extremities. No lower extremity tenderness nor edema. Neurologic:  Normal speech and language. No gross focal neurologic deficits are appreciated. Skin:  Skin is warm, dry and intact. No rash noted. Psychiatric: Mood and affect are  normal. Patient exhibits appropriate insight and judgment. The patient is anxious  ____________________________________________    LABS (pertinent positives/negatives)  Labs Reviewed  CBC WITH DIFFERENTIAL/PLATELET - Abnormal; Notable for the following:    RBC 3.75 (*)    Hemoglobin 11.1 (*)    HCT 34.1 (*)    All other components within normal limits  COMPREHENSIVE METABOLIC PANEL - Abnormal; Notable for the following:    Chloride 114 (*)    Glucose, Bld 114 (*)    Calcium 8.7 (*)    Total Bilirubin 0.2 (*)    All other components within normal limits   hemoglobin stable ____________________________________________   EKG  None  ____________________________________________    RADIOLOGY  None  ____________________________________________   PROCEDURES  Procedure(s) performed:none  Critical Care performed:none  ____________________________________________   INITIAL IMPRESSION / ASSESSMENT AND PLAN / ED COURSE  Pertinent labs & imaging results that were available during my care of the patient were reviewed by me and considered in my medical decision making (see chart for details).  Discussed case with Dr. Leanora Cover our general surgeon. He notes the patient will probably best be served a tertiary care center given her young age and history of a prior repair. He will see the patient in the ED   ____________________________________________ ----------------------------------------- 6:46 PM on 07/20/2014 -----------------------------------------  Patient seen by our general surgeon Dr. Leanora Cover feels patient does not require admission and will need close outpatient follow up with a colorectal surgeon as no one in his group performs these surgeries. The closest colorectal surgeon is in [facility], we have arranged for close outpatient follow-up. Have discussed this with patient and family. Our staff will also contact the surgeon tomorrow to be sure  patient has follow-up  FINAL CLINICAL IMPRESSION(S) / ED DIAGNOSES  Final diagnoses:  Rectal prolapse     Lavonia Drafts, MD 07/20/14 Lurline Hare

## 2014-07-20 NOTE — Discharge Instructions (Signed)
Mrs. Petitti please call Dr. Grandville Silos in the morning to arrange outpatient follow up.

## 2014-07-21 ENCOUNTER — Encounter: Payer: Self-pay | Admitting: *Deleted

## 2014-07-21 ENCOUNTER — Emergency Department
Admission: EM | Admit: 2014-07-21 | Discharge: 2014-07-21 | Disposition: A | Discharge status: Home / Self Care | Payer: Medicare Other | Attending: Emergency Medicine | Admitting: Emergency Medicine

## 2014-07-21 DIAGNOSIS — Z79899 Other long term (current) drug therapy: Secondary | ICD-10-CM | POA: Insufficient documentation

## 2014-07-21 DIAGNOSIS — K623 Rectal prolapse: Secondary | ICD-10-CM

## 2014-07-21 DIAGNOSIS — E119 Type 2 diabetes mellitus without complications: Secondary | ICD-10-CM | POA: Insufficient documentation

## 2014-07-21 DIAGNOSIS — I10 Essential (primary) hypertension: Secondary | ICD-10-CM | POA: Insufficient documentation

## 2014-07-21 DIAGNOSIS — K625 Hemorrhage of anus and rectum: Secondary | ICD-10-CM | POA: Diagnosis present

## 2014-07-21 DIAGNOSIS — Z87891 Personal history of nicotine dependence: Secondary | ICD-10-CM | POA: Insufficient documentation

## 2014-07-21 DIAGNOSIS — Z7902 Long term (current) use of antithrombotics/antiplatelets: Secondary | ICD-10-CM | POA: Insufficient documentation

## 2014-07-21 LAB — CBC WITH DIFFERENTIAL/PLATELET
BASOS ABS: 0 10*3/uL (ref 0–0.1)
Basophils Relative: 0 %
Eosinophils Absolute: 0.1 10*3/uL (ref 0–0.7)
Eosinophils Relative: 1 %
HEMATOCRIT: 34.3 % — AB (ref 35.0–47.0)
Hemoglobin: 10.9 g/dL — ABNORMAL LOW (ref 12.0–16.0)
LYMPHS ABS: 1.7 10*3/uL (ref 1.0–3.6)
LYMPHS PCT: 23 %
MCH: 29 pg (ref 26.0–34.0)
MCHC: 31.8 g/dL — ABNORMAL LOW (ref 32.0–36.0)
MCV: 91.1 fL (ref 80.0–100.0)
Monocytes Absolute: 0.4 10*3/uL (ref 0.2–0.9)
Monocytes Relative: 5 %
Neutro Abs: 5.2 10*3/uL (ref 1.4–6.5)
Neutrophils Relative %: 71 %
Platelets: 192 10*3/uL (ref 150–440)
RBC: 3.76 MIL/uL — AB (ref 3.80–5.20)
RDW: 14.5 % (ref 11.5–14.5)
WBC: 7.4 10*3/uL (ref 3.6–11.0)

## 2014-07-21 MED ORDER — TUCKS 50 % EX PADS
1.0000 | MEDICATED_PAD | Freq: Three times a day (TID) | CUTANEOUS | Status: DC
Start: 2014-07-21 — End: 2014-08-10

## 2014-07-21 NOTE — ED Provider Notes (Signed)
[facility] Emergency Department Provider Note  ____________________________________________  Time seen: 5:25 PM  I have reviewed the triage vital signs and the nursing notes.   HISTORY  Chief Complaint Rectal Bleeding    HPI Marie Clarke is a 44 y.o. female who complains of severe rectal pain. She was in the ED yesterday and diagnosed with a rectal prolapse. She follow up in surgery clinic this afternoon and was given Percocet for pain control.She was referred to further follow-up, and then when the patient went home she called EMS. A report from EMS, she was standing in her kitchen eating chicken wings complaining that something needed to be done right away because she doesn't want to bear the pain anymore.  In the treatment room, the patient is shouting and demanding that "something be done "today. She is verbal abusive and rude towards staff. She denies chest pain shortness of breath dizziness lightheadedness or syncope. She reports that she has lost "a lot" of blood but cannot quantify.     Past Medical History  Diagnosis Date  . CAD (coronary artery disease) unk  . Diabetes mellitus without complication   . Hypertension   . Depression unk  . Pancreatitis unk  . Thyroid disease   . TIA (transient ischemic attack) unk    There are no active problems to display for this patient.   Past Surgical History  Procedure Laterality Date  . Prolapse rectum surgery N/A   . Abdominal hysterectomy      Current Outpatient Rx  Name  Route  Sig  Dispense  Refill  . pregabalin (LYRICA) 100 MG capsule   Oral   Take 100 mg by mouth 3 (three) times daily.         Marland Kitchen atorvastatin (LIPITOR) 40 MG tablet   Oral   Take 40 mg by mouth daily.         . benzonatate (TESSALON) 100 MG capsule   Oral   Take 200 mg by mouth 3 (three) times daily.         . clopidogrel (PLAVIX) 75 MG tablet   Oral   Take 75 mg by mouth daily.         Marland Kitchen dicyclomine (BENTYL)  10 MG capsule   Oral   Take 10 mg by mouth 4 (four) times daily -  before meals and at bedtime.         Marland Kitchen glipiZIDE (GLUCOTROL XL) 10 MG 24 hr tablet   Oral   Take 10 mg by mouth daily with breakfast.         . levocetirizine (XYZAL) 5 MG tablet   Oral   Take 5 mg by mouth every evening.         Marland Kitchen levothyroxine (SYNTHROID, LEVOTHROID) 200 MCG tablet   Oral   Take 200 mcg by mouth daily before breakfast.         . metoprolol (LOPRESSOR) 50 MG tablet   Oral   Take 25 mg by mouth 2 (two) times daily.         . montelukast (SINGULAIR) 10 MG tablet   Oral   Take 10 mg by mouth at bedtime.         . Olopatadine HCl 0.2 % SOLN   Ophthalmic   Apply 1 drop to eye daily. Each eye         . ondansetron (ZOFRAN-ODT) 4 MG disintegrating tablet   Oral   Take 4 mg by mouth every 4 (four)  hours as needed for nausea or vomiting.         Marland Kitchen oxybutynin (DITROPAN-XL) 10 MG 24 hr tablet   Oral   Take 10 mg by mouth at bedtime.         Marland Kitchen oxyCODONE (ROXICODONE) 5 MG immediate release tablet   Oral   Take 1 tablet (5 mg total) by mouth every 8 (eight) hours as needed.   20 tablet   0   . Pancrelipase, Lip-Prot-Amyl, 24000 UNITS CPEP   Oral   Take 3 capsules by mouth 3 (three) times daily.         . polyethylene glycol (MIRALAX) packet   Oral   Take 17 g by mouth daily.   14 each   0   . ranitidine (ZANTAC) 300 MG tablet   Oral   Take 300 mg by mouth at bedtime.         . ranolazine (RANEXA) 500 MG 12 hr tablet   Oral   Take 500 mg by mouth 2 (two) times daily.         Marland Kitchen tiZANidine (ZANAFLEX) 4 MG tablet   Oral   Take 4 mg by mouth 3 (three) times daily.         Marland Kitchen topiramate (TOPAMAX) 100 MG tablet   Oral   Take 100 mg by mouth 3 (three) times daily.         Marland Kitchen torsemide (DEMADEX) 20 MG tablet   Oral   Take 20 mg by mouth daily.         Marland Kitchen venlafaxine XR (EFFEXOR-XR) 150 MG 24 hr capsule   Oral   Take 150 mg by mouth daily with breakfast.          . venlafaxine XR (EFFEXOR-XR) 37.5 MG 24 hr capsule   Oral   Take 37.5 mg by mouth daily with breakfast.         . Witch Hazel (TUCKS) 50 % PADS   Apply externally   Apply 1 application topically 3 (three) times daily.   40 each   0     Allergies Flagyl and Sulfa antibiotics  History reviewed. No pertinent family history.  Social History History  Substance Use Topics  . Smoking status: Former Research scientist (life sciences)  . Smokeless tobacco: Not on file  . Alcohol Use: No    Review of Systems  Constitutional: No fever or chills. No weight changes Eyes:No blurry vision or double vision.  ENT: No sore throat. Cardiovascular: No chest pain. Respiratory: No dyspnea or cough. Gastrointestinal: Negative for abdominal pain, vomiting and diarrhea.  No BRBPR or melena. Positive rectal pain and bleeding Genitourinary: Negative for dysuria, urinary retention, bloody urine, or difficulty urinating. Musculoskeletal: Negative for back pain. No joint swelling or pain. Skin: Negative for rash. Neurological: Negative for headaches, focal weakness or numbness. Psychiatric:No anxiety or depression.   Endocrine:No hot/cold intolerance, changes in energy, or sleep difficulty.  10-point ROS otherwise negative.  ____________________________________________   PHYSICAL EXAM:  VITAL SIGNS: ED Triage Vitals  Enc Vitals Group     BP 07/21/14 1736 111/52 mmHg     Pulse Rate 07/21/14 1736 77     Resp 07/21/14 1736 16     Temp 07/21/14 1736 97.9 F (36.6 C)     Temp Source 07/21/14 1736 Oral     SpO2 07/21/14 1736 98 %     Weight 07/21/14 1736 232 lb (105.235 kg)     Height 07/21/14 1736 5\' 3"  (1.6 m)  Head Cir --      Peak Flow --      Pain Score --      Pain Loc --      Pain Edu? --      Excl. in [place]? --      Constitutional: Alert and oriented. In moderate pain but not in distress. Eyes: No scleral icterus. No conjunctival pallor. PERRL. EOMI ENT   Head: Normocephalic and  atraumatic.   Nose: No congestion/rhinnorhea. No septal hematoma   Mouth/Throat: MMM, no pharyngeal erythema. No peritonsillar mass. No uvula shift.   Neck: No stridor. No SubQ emphysema. No meningismus. Hematological/Lymphatic/Immunilogical: No cervical lymphadenopathy. Cardiovascular: RRR. Normal and symmetric distal pulses are present in all extremities. No murmurs, rubs, or gallops. Respiratory: Normal respiratory effort without tachypnea nor retractions. Breath sounds are clear and equal bilaterally. No wheezes/rales/rhonchi. Gastrointestinal: Soft and nontender. No distention.   No rebound, rigidity, or guarding. Rectal exam reveals very slight rectal prolapse with an anal fissure at the 6:00 position. There is no active bleeding. Patient would not allow internal rectal exam. No obvious hemorrhoids. There is a very scant amount of red tinged, pinkish fluid on a panty liner in the patient's clothes. Genitourinary: deferred Musculoskeletal: Nontender with normal range of motion in all extremities. No joint effusions.  No lower extremity tenderness.  No edema. Neurologic:   Normal speech and language.  CN 2-10 normal. Motor grossly intact. No pronator drift.  Normal gait. No gross focal neurologic deficits are appreciated.  Skin:  Skin is warm, dry and intact. No rash noted.  No petechiae, purpura, or bullae. Psychiatric: Confrontational affect, alternating between threatening staff and being effusive and excessively nice to staff.  ____________________________________________    LABS (pertinent positives/negatives) (all labs ordered are listed, but only abnormal results are displayed) Labs Reviewed  CBC WITH DIFFERENTIAL/PLATELET - Abnormal; Notable for the following:    RBC 3.76 (*)    Hemoglobin 10.9 (*)    HCT 34.3 (*)    MCHC 31.8 (*)    All other components within normal limits    ____________________________________________   EKG    ____________________________________________    RADIOLOGY    ____________________________________________   PROCEDURES  ____________________________________________   INITIAL IMPRESSION / ASSESSMENT AND PLAN / ED COURSE  Pertinent labs & imaging results that were available during my care of the patient were reviewed by me and considered in my medical decision making (see chart for details).  Patient presents with rectal pain due to rectal prolapse and anal fissure. Her hemoglobin is stable and by exam and laboratory assessment, she is not having any significant GI bleeding. Is no evidence of ischemia at the rectal prolapse area. She has appropriate follow-up and opioid pain medicine. I'll also prescribe her Tucks as this may help. She is advised to follow up with surgery for ongoing management of her prolapse.  ____________________________________________   FINAL CLINICAL IMPRESSION(S) / ED DIAGNOSES  Final diagnoses:  Rectal prolapse      Carrie Mew, MD 07/21/14 [phone]

## 2014-07-21 NOTE — ED Notes (Signed)
Pt arrived via EMS from home reporting "something is hanging out" of pts rectum. Pt reports bleeding from rectum for the 5 days with increase in blood today. Pt reports having 3-4 bloody pads per hour. Pt reports MD reported previous possibility of prolapsed bowel. Pt reports being seen by MD today and being told to follow up with different MD in another week.

## 2014-07-29 ENCOUNTER — Ambulatory Visit: Payer: Self-pay | Admitting: Surgery

## 2014-08-05 ENCOUNTER — Encounter: Payer: Self-pay | Admitting: Surgery

## 2014-08-05 ENCOUNTER — Ambulatory Visit (INDEPENDENT_AMBULATORY_CARE_PROVIDER_SITE_OTHER): Payer: Medicare Other | Admitting: Surgery

## 2014-08-05 VITALS — BP 100/62 | HR 93 | Temp 98.2°F | Ht 64.0 in | Wt 228.0 lb

## 2014-08-05 DIAGNOSIS — N189 Chronic kidney disease, unspecified: Secondary | ICD-10-CM | POA: Insufficient documentation

## 2014-08-05 DIAGNOSIS — I2510 Atherosclerotic heart disease of native coronary artery without angina pectoris: Secondary | ICD-10-CM | POA: Insufficient documentation

## 2014-08-05 DIAGNOSIS — G939 Disorder of brain, unspecified: Secondary | ICD-10-CM | POA: Insufficient documentation

## 2014-08-05 DIAGNOSIS — K623 Rectal prolapse: Secondary | ICD-10-CM | POA: Diagnosis not present

## 2014-08-05 DIAGNOSIS — I6782 Cerebral ischemia: Secondary | ICD-10-CM | POA: Insufficient documentation

## 2014-08-05 DIAGNOSIS — I509 Heart failure, unspecified: Secondary | ICD-10-CM | POA: Insufficient documentation

## 2014-08-05 DIAGNOSIS — F319 Bipolar disorder, unspecified: Secondary | ICD-10-CM | POA: Insufficient documentation

## 2014-08-05 DIAGNOSIS — G4730 Sleep apnea, unspecified: Secondary | ICD-10-CM | POA: Insufficient documentation

## 2014-08-05 DIAGNOSIS — N3281 Overactive bladder: Secondary | ICD-10-CM | POA: Insufficient documentation

## 2014-08-05 NOTE — Progress Notes (Signed)
Surgical Consultation  08/05/2014  Marie Clarke is an 44 y.o. female.   Chief Complaint  Patient presents with  . Rectal Prolapse     HPI: She is referred from the emergency room for evaluation of rectal prolapse. She was seen approximately 2 weeks ago with a large segment of her rectum prolapsed without obvious incident. The bowel was easily reduced and she is referred to our office to consider further intervention.  Of note, is the fact that she's had a previous sigmoid and rectal excision in [place] 2 years ago for similar symptoms. I do not have the operative note from her description a removed a small segment of her bowel performed a pexy of her remaining distal colon. She's had a previous hysterectomy for fibroid disease.  She has long-standing issues with diarrhea primarily foul-smelling watery diarrhea. She does take medication for that and carries a diagnosis of pancreatitis and irritable bowel syndrome. He has not been treated locally for those problems. She has relocated from [place]. He denies a history of hepatitis yellow jaundice peptic ulcer disease or gallbladder disease.  She does have history of coronary artery disease after multiple catheterizations but without intervention. She has history of diabetes currently on medication and hypertension currently on medication.  Past Medical History  Diagnosis Date  . CAD (coronary artery disease) unk  . Diabetes mellitus without complication   . Hypertension   . Depression unk  . Pancreatitis unk  . Thyroid disease   . TIA (transient ischemic attack) unk    Past Surgical History  Procedure Laterality Date  . Prolapse rectum surgery N/A   . Abdominal hysterectomy      History reviewed. No pertinent family history.  Social History:  reports that she has quit smoking. She has never used smokeless tobacco. She reports that she does not drink alcohol or use illicit drugs.  Allergies:  Allergies  Allergen  Reactions  . Flagyl [Metronidazole] Hives  . Sulfa Antibiotics Hives  . Azithromycin Rash  . Ciprofloxacin Rash  . Levofloxacin Rash  . Vancomycin Rash    Medications reviewed.     Review of Systems  Constitutional: Positive for malaise/fatigue. Negative for fever and weight loss.  HENT: Negative for congestion.   Eyes: Negative for blurred vision and pain.  Respiratory: Negative for cough and shortness of breath.   Cardiovascular: Negative for chest pain, palpitations and orthopnea.  Gastrointestinal: Positive for heartburn, diarrhea and blood in stool. Negative for nausea, vomiting, abdominal pain, constipation and melena.  Genitourinary: Negative for dysuria, urgency and frequency.  Musculoskeletal: Positive for myalgias. Negative for back pain.  Skin: Negative for itching and rash.  Neurological: Negative for dizziness, focal weakness, seizures and headaches.  Endo/Heme/Allergies: Does not bruise/bleed easily.  Psychiatric/Behavioral: Positive for depression. Negative for substance abuse. The patient is not nervous/anxious.        BP 100/62 mmHg  Pulse 93  Temp(Src) 98.2 F (36.8 C) (Oral)  Ht 5\' 4"  (1.626 m)  Wt 228 lb (103.42 kg)  BMI 39.12 kg/m2  Physical Exam  Constitutional: She is oriented to person, place, and time and well-developed, well-nourished, and in no distress. No distress.  HENT:  Head: Normocephalic and atraumatic.  Eyes: Conjunctivae are normal. Pupils are equal, round, and reactive to light.  Neck: Normal range of motion. Neck supple.  Cardiovascular: Normal rate and regular rhythm.   Pulmonary/Chest: Effort normal and breath sounds normal.  Abdominal: Soft. Bowel sounds are normal.  Genitourinary: Rectal exam shows external  hemorrhoid. Rectal exam shows no laceration, no mass and no tenderness.  Musculoskeletal: Normal range of motion. She exhibits edema.  Neurological: She is alert and oriented to person, place, and time.  Skin: Skin is  warm and dry.  Psychiatric: Her mood appears not anxious. Her affect is blunt.      No results found for this or any previous visit (from the past 48 hour(s)). No results found.  Assessment/Plan: She does not have any obvious prolapse on examination even with straining. We did not do an endoluminal evaluation. With a recurrent rectal prolapse and history of multiple other medical problems I would recommend she consider a colorectal evaluation. I do not feel that we are comfortable doing this type of surgery were considering the various options for intervention. I discussed the plan with her in detail and she is in agreement. We'll [facility] She is in agreement.Dia Crawford III dermatitis

## 2014-08-06 ENCOUNTER — Telehealth: Payer: Self-pay | Admitting: Surgery

## 2014-08-06 NOTE — Telephone Encounter (Signed)
Pt called she was seen yesterday for prolapse rectum and yesterday it seemed better but today its bleeding and she has diarrhea today, pt mentioned Dr.Ely was going to send a referral to [facility] for her to be seen.

## 2014-08-06 NOTE — Telephone Encounter (Signed)
Spoke with patient at this time. Explained that she needs to avoid fatty foods and stick to a bland BRAT diet to avoid more diarrhea. Discouraged patient from using Imodium to avoid constipation.  She asked about referral and I explained that we were working on it and if she does not hear from the clinic with an appointment by 08/13/14, she needs to call our office so that we can check the status of the referral.

## 2014-08-09 ENCOUNTER — Encounter: Payer: Self-pay | Admitting: Emergency Medicine

## 2014-08-09 ENCOUNTER — Telehealth: Payer: Self-pay | Admitting: Surgery

## 2014-08-09 ENCOUNTER — Emergency Department: Payer: Medicare Other

## 2014-08-09 ENCOUNTER — Inpatient Hospital Stay
Admission: EM | Admit: 2014-08-09 | Discharge: 2014-08-11 | DRG: 395 | Disposition: A | Discharge status: Home / Self Care | Payer: Medicare Other | Attending: Internal Medicine | Admitting: Internal Medicine

## 2014-08-09 DIAGNOSIS — N183 Chronic kidney disease, stage 3 unspecified: Secondary | ICD-10-CM | POA: Diagnosis present

## 2014-08-09 DIAGNOSIS — E039 Hypothyroidism, unspecified: Secondary | ICD-10-CM | POA: Diagnosis present

## 2014-08-09 DIAGNOSIS — E119 Type 2 diabetes mellitus without complications: Secondary | ICD-10-CM | POA: Diagnosis present

## 2014-08-09 DIAGNOSIS — K922 Gastrointestinal hemorrhage, unspecified: Secondary | ICD-10-CM

## 2014-08-09 DIAGNOSIS — Z66 Do not resuscitate: Secondary | ICD-10-CM | POA: Diagnosis present

## 2014-08-09 DIAGNOSIS — K625 Hemorrhage of anus and rectum: Secondary | ICD-10-CM | POA: Diagnosis present

## 2014-08-09 DIAGNOSIS — F319 Bipolar disorder, unspecified: Secondary | ICD-10-CM | POA: Diagnosis present

## 2014-08-09 DIAGNOSIS — I129 Hypertensive chronic kidney disease with stage 1 through stage 4 chronic kidney disease, or unspecified chronic kidney disease: Secondary | ICD-10-CM | POA: Diagnosis present

## 2014-08-09 DIAGNOSIS — Z882 Allergy status to sulfonamides status: Secondary | ICD-10-CM

## 2014-08-09 DIAGNOSIS — Z7902 Long term (current) use of antithrombotics/antiplatelets: Secondary | ICD-10-CM

## 2014-08-09 DIAGNOSIS — K623 Rectal prolapse: Principal | ICD-10-CM | POA: Diagnosis present

## 2014-08-09 DIAGNOSIS — I2510 Atherosclerotic heart disease of native coronary artery without angina pectoris: Secondary | ICD-10-CM | POA: Diagnosis present

## 2014-08-09 DIAGNOSIS — Z87891 Personal history of nicotine dependence: Secondary | ICD-10-CM | POA: Diagnosis not present

## 2014-08-09 DIAGNOSIS — I10 Essential (primary) hypertension: Secondary | ICD-10-CM | POA: Diagnosis present

## 2014-08-09 LAB — CBC WITH DIFFERENTIAL/PLATELET
BASOS PCT: 0 %
Basophils Absolute: 0 10*3/uL (ref 0–0.1)
EOS ABS: 0.1 10*3/uL (ref 0–0.7)
Eosinophils Relative: 2 %
HCT: 32.2 % — ABNORMAL LOW (ref 35.0–47.0)
HEMOGLOBIN: 10.6 g/dL — AB (ref 12.0–16.0)
Lymphocytes Relative: 36 %
Lymphs Abs: 2.9 10*3/uL (ref 1.0–3.6)
MCH: 29.6 pg (ref 26.0–34.0)
MCHC: 33.1 g/dL (ref 32.0–36.0)
MCV: 89.5 fL (ref 80.0–100.0)
Monocytes Absolute: 0.5 10*3/uL (ref 0.2–0.9)
Monocytes Relative: 6 %
NEUTROS PCT: 56 %
Neutro Abs: 4.5 10*3/uL (ref 1.4–6.5)
Platelets: 210 10*3/uL (ref 150–440)
RBC: 3.6 MIL/uL — ABNORMAL LOW (ref 3.80–5.20)
RDW: 14.5 % (ref 11.5–14.5)
WBC: 8.1 10*3/uL (ref 3.6–11.0)

## 2014-08-09 LAB — COMPREHENSIVE METABOLIC PANEL
ALK PHOS: 85 U/L (ref 38–126)
ALT: 13 U/L — AB (ref 14–54)
ANION GAP: 9 (ref 5–15)
AST: 14 U/L — ABNORMAL LOW (ref 15–41)
Albumin: 3.7 g/dL (ref 3.5–5.0)
BILIRUBIN TOTAL: 0.3 mg/dL (ref 0.3–1.2)
BUN: 26 mg/dL — AB (ref 6–20)
CHLORIDE: 108 mmol/L (ref 101–111)
CO2: 23 mmol/L (ref 22–32)
Calcium: 8.9 mg/dL (ref 8.9–10.3)
Creatinine, Ser: 1.19 mg/dL — ABNORMAL HIGH (ref 0.44–1.00)
GFR calc non Af Amer: 55 mL/min — ABNORMAL LOW (ref >60)
GLUCOSE: 101 mg/dL — AB (ref 65–99)
POTASSIUM: 3.5 mmol/L (ref 3.5–5.1)
SODIUM: 140 mmol/L (ref 135–145)
TOTAL PROTEIN: 6.8 g/dL (ref 6.5–8.1)

## 2014-08-09 LAB — LIPASE, BLOOD: Lipase: 51 U/L (ref 22–51)

## 2014-08-09 LAB — TYPE AND SCREEN
ABO/RH(D): A NEG
Antibody Screen: NEGATIVE

## 2014-08-09 LAB — TROPONIN I

## 2014-08-09 MED ORDER — ONDANSETRON HCL 4 MG/2ML IJ SOLN
4.0000 mg | Freq: Once | INTRAMUSCULAR | Status: AC
  Administered 2014-08-09: 4 mg via INTRAVENOUS

## 2014-08-09 MED ORDER — ONDANSETRON HCL 4 MG/2ML IJ SOLN
INTRAMUSCULAR | Status: AC
  Administered 2014-08-09: 4 mg via INTRAVENOUS
  Filled 2014-08-09: qty 2

## 2014-08-09 MED ORDER — MORPHINE SULFATE 4 MG/ML IJ SOLN
INTRAMUSCULAR | Status: AC
  Administered 2014-08-09: 4 mg via INTRAVENOUS
  Filled 2014-08-09: qty 1

## 2014-08-09 MED ORDER — MORPHINE SULFATE 4 MG/ML IJ SOLN
4.0000 mg | Freq: Once | INTRAMUSCULAR | Status: AC
  Administered 2014-08-09: 4 mg via INTRAVENOUS

## 2014-08-09 MED ORDER — SODIUM CHLORIDE 0.9 % IV BOLUS (SEPSIS)
1000.0000 mL | Freq: Once | INTRAVENOUS | Status: AC
  Administered 2014-08-09: 1000 mL via INTRAVENOUS

## 2014-08-09 NOTE — H&P (Addendum)
[facility] NAME: Marie Clarke    MR#:  [id number]  DATE OF BIRTH:  1970/09/08  DATE OF ADMISSION:  08/09/2014  PRIMARY CARE PHYSICIAN: Sharyne Peach, MD   REQUESTING/REFERRING PHYSICIAN: Clearnce Hasten  CHIEF COMPLAINT:   Chief Complaint  Patient presents with  . Rectal Bleeding    HISTORY OF PRESENT ILLNESS:  Marie Clarke  is a 44 y.o. female who presents with rectal bleeding in the setting of rectal prolapse. Patient has a known history of rectal prolapse, and has had a surgical procedure for this in the past. She is more recently seen Dr. Pat Patrick as she's had some recurrence of this problem, and per her report she was waiting for coordination to get her to [facility] for the colorectal team to evaluate her. The past several weeks she's been having increased rectal bleeding and recurrence of rectal prolapse during bowel movements. This got acutely worse over the past couple days and more specifically today she had a large amount of bleeding from her rectum. She came in for evaluation the ED, and in the ED had a couple more episodes of rectal bleeding, and was noted to have borderline to mildly hypotensive blood pressure. Hemoglobin was not significantly decreased on evaluation, but with recent increased volume of blood loss, and her hypotension, hospitals were called for admission for GI bleed.  PAST MEDICAL HISTORY:   Past Medical History  Diagnosis Date  . CAD (coronary artery disease) unk  . Diabetes mellitus without complication   . Hypertension   . Depression unk  . Pancreatitis unk  . Thyroid disease   . TIA (transient ischemic attack) unk    PAST SURGICAL HISTORY:   Past Surgical History  Procedure Laterality Date  . Prolapse rectum surgery N/A   . Abdominal hysterectomy      SOCIAL HISTORY:   History  Substance Use Topics  . Smoking status: Former Research scientist (life sciences)  . Smokeless tobacco: Never Used  . Alcohol Use: No     FAMILY HISTORY:   Family History  Problem Relation Age of Onset  . Diabetes Mellitus II Mother   . CAD Mother   . Sleep apnea Mother   . Osteoarthritis Mother   . Osteoporosis Mother   . Bipolar disorder Father   . Hypertension Father     DRUG ALLERGIES:   Allergies  Allergen Reactions  . Flagyl [Metronidazole] Hives  . Sulfa Antibiotics Hives  . Amoxicillin Rash  . Azithromycin Rash  . Cephalexin Rash  . Ciprofloxacin Rash  . Levofloxacin Rash  . Vancomycin Rash    MEDICATIONS AT HOME:   Prior to Admission medications   Medication Sig Start Date End Date Taking? Authorizing Provider  albuterol (PROAIR HFA) 108 (90 BASE) MCG/ACT inhaler Inhale into the lungs.    Historical Provider, MD  atorvastatin (LIPITOR) 40 MG tablet Take 40 mg by mouth daily.    Historical Provider, MD  benzonatate (TESSALON) 100 MG capsule Take 200 mg by mouth 3 (three) times daily.    Historical Provider, MD  clopidogrel (PLAVIX) 75 MG tablet Take 75 mg by mouth daily.    Historical Provider, MD  dicyclomine (BENTYL) 10 MG capsule Take 10 mg by mouth 4 (four) times daily -  before meals and at bedtime.    Historical Provider, MD  fluticasone-salmeterol (ADVAIR HFA) 230-21 MCG/ACT inhaler Inhale into the lungs. 05/10/14 05/10/15  Historical Provider, MD  glipiZIDE (GLUCOTROL XL) 10 MG 24 hr tablet  Take 10 mg by mouth daily with breakfast.    Historical Provider, MD  levocetirizine (XYZAL) 5 MG tablet Take 5 mg by mouth every evening.    Historical Provider, MD  levothyroxine (SYNTHROID, LEVOTHROID) 200 MCG tablet Take 200 mcg by mouth daily before breakfast.    Historical Provider, MD  metoprolol (LOPRESSOR) 50 MG tablet Take 25 mg by mouth 2 (two) times daily.    Historical Provider, MD  montelukast (SINGULAIR) 10 MG tablet Take 10 mg by mouth at bedtime.    Historical Provider, MD  Olopatadine HCl 0.2 % SOLN Apply 1 drop to eye daily. Each eye    Historical Provider, MD  ondansetron  (ZOFRAN-ODT) 4 MG disintegrating tablet Take 4 mg by mouth every 4 (four) hours as needed for nausea or vomiting.    Historical Provider, MD  oxybutynin (DITROPAN-XL) 10 MG 24 hr tablet Take 10 mg by mouth at bedtime.    Historical Provider, MD  oxyCODONE (ROXICODONE) 5 MG immediate release tablet Take 1 tablet (5 mg total) by mouth every 8 (eight) hours as needed. Patient not taking: Reported on 08/05/2014 07/20/14 07/20/15  Lavonia Drafts, MD  Pancrelipase, Lip-Prot-Amyl, 24000 UNITS CPEP Take 3 capsules by mouth 3 (three) times daily.    Historical Provider, MD  polyethylene glycol (MIRALAX) packet Take 17 g by mouth daily. Patient not taking: Reported on 08/05/2014 07/20/14   Lavonia Drafts, MD  pregabalin (LYRICA) 100 MG capsule Take 100 mg by mouth 3 (three) times daily.    Historical Provider, MD  ranitidine (ZANTAC) 300 MG tablet Take 300 mg by mouth at bedtime.    Historical Provider, MD  ranolazine (RANEXA) 500 MG 12 hr tablet Take 500 mg by mouth 2 (two) times daily.    Historical Provider, MD  tiZANidine (ZANAFLEX) 4 MG tablet Take 4 mg by mouth 3 (three) times daily.    Historical Provider, MD  topiramate (TOPAMAX) 100 MG tablet Take 100 mg by mouth 3 (three) times daily.    Historical Provider, MD  torsemide (DEMADEX) 20 MG tablet Take 20 mg by mouth daily.    Historical Provider, MD  venlafaxine XR (EFFEXOR-XR) 150 MG 24 hr capsule Take 150 mg by mouth daily with breakfast.    Historical Provider, MD  venlafaxine XR (EFFEXOR-XR) 37.5 MG 24 hr capsule Take 37.5 mg by mouth daily with breakfast.    Historical Provider, MD  Witch Hazel (TUCKS) 50 % PADS Apply 1 application topically 3 (three) times daily. 07/21/14   Carrie Mew, MD    REVIEW OF SYSTEMS:  Review of Systems  Constitutional: Negative for fever, chills, weight loss and malaise/fatigue.  HENT: Negative for ear pain, hearing loss and tinnitus.   Eyes: Negative for blurred vision, double vision, pain and redness.  Respiratory:  Negative for cough, hemoptysis and shortness of breath.   Cardiovascular: Negative for chest pain, palpitations, orthopnea and leg swelling.  Gastrointestinal: Positive for blood in stool. Negative for nausea, vomiting, abdominal pain, diarrhea and constipation.       Rectal pain and prolapse  Genitourinary: Negative for dysuria, frequency and hematuria.  Musculoskeletal: Negative for back pain, joint pain and neck pain.  Skin:       No acne, rash, or lesions  Neurological: Negative for dizziness, tremors, focal weakness and weakness.  Endo/Heme/Allergies: Negative for polydipsia. Does not bruise/bleed easily.  Psychiatric/Behavioral: Negative for depression. The patient is not nervous/anxious and does not have insomnia.      VITAL SIGNS:   Filed Vitals:  08/09/14 2130 08/09/14 2200 08/09/14 2206 08/09/14 2213  BP: 77/42 74/33 106/65   Pulse:    65  Temp:      TempSrc:      Resp: 8 15 16 12   Height:      Weight:      SpO2:    98%   Wt Readings from Last 3 Encounters:  08/09/14 99.791 kg (220 lb)  08/05/14 103.42 kg (228 lb)  07/21/14 105.235 kg (232 lb)    PHYSICAL EXAMINATION:  Physical Exam  Constitutional: She is oriented to person, place, and time. She appears well-developed and well-nourished. No distress.  HENT:  Head: Normocephalic and atraumatic.  Mouth/Throat: Oropharynx is clear and moist.  Eyes: Conjunctivae and EOM are normal. Pupils are equal, round, and reactive to light. No scleral icterus.  Neck: Normal range of motion. Neck supple. No JVD present. No thyromegaly present.  Cardiovascular: Normal rate, regular rhythm and intact distal pulses.  Exam reveals no gallop and no friction rub.   No murmur heard. Respiratory: Effort normal and breath sounds normal. No respiratory distress. She has no wheezes. She has no rales.  GI: Soft. Bowel sounds are normal. She exhibits no distension. There is no tenderness.  Genitourinary:  No prolapse on exam   Musculoskeletal: Normal range of motion. She exhibits no edema.  No arthritis, no gout  Lymphadenopathy:    She has no cervical adenopathy.  Neurological: She is alert and oriented to person, place, and time. No cranial nerve deficit.  No dysarthria, no aphasia  Skin: Skin is warm and dry. No rash noted. No erythema.  Psychiatric: She has a normal mood and affect. Her behavior is normal. Judgment and thought content normal.    LABORATORY PANEL:   CBC  Recent Labs Lab 08/09/14 2014  WBC 8.1  HGB 10.6*  HCT 32.2*  PLT 210   ------------------------------------------------------------------------------------------------------------------  Chemistries   Recent Labs Lab 08/09/14 2014  NA 140  K 3.5  CL 108  CO2 23  GLUCOSE 101*  BUN 26*  CREATININE 1.19*  CALCIUM 8.9  AST 14*  ALT 13*  ALKPHOS 85  BILITOT 0.3   ------------------------------------------------------------------------------------------------------------------  Cardiac Enzymes  Recent Labs Lab 08/09/14 2014  TROPONINI <0.03   ------------------------------------------------------------------------------------------------------------------  RADIOLOGY:  Dg Chest 1 View  08/09/2014   CLINICAL DATA:  Abdominal pain and rectal bleeding.  EXAM: CHEST  1 VIEW  COMPARISON:  02/22/2014  FINDINGS: The cardiomediastinal contours are normal. Pulmonary vasculature is normal for technique. No consolidation, pleural effusion, or pneumothorax. No acute osseous abnormalities are seen.  IMPRESSION: No acute pulmonary process.   Electronically Signed   By: Jeb Levering M.D.   On: 08/09/2014 20:36    EKG:   Orders placed or performed in visit on 02/21/14  . EKG 12-Lead    IMPRESSION AND PLAN:  Principal Problem:   Rectal bleeding - most likely secondary to her recurrent prolapse. We will admit her due to her hypotension after report of large volume rectal bleeding. Trend her hemoglobin, aggressive fluids  for blood pressure support, consult surgery. Active Problems:   Rectal prolapse - see above   Controlled diabetes mellitus type II without complication - patient will be nothing by mouth for now pending question of some potential surgical intervention, she will need a heart healthy/carb modified diet afterwards, we'll keep her on sliding scale insulin and fingerstick glucose checks every 6 hours for now, to be switched to before meals at bedtime when she is eating again.  Essential (primary) hypertension - chronic problem, hold her home antihypertensives as her blood pressures actually borderline to low right now.   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   Chronic kidney disease, stage III (moderate) - monitor, avoid nephrotoxins   Adult hypothyroidism - continue home thyroid replacement  All the records are reviewed and case discussed with ED provider. Management plans discussed with the patient and/or family.  DVT PROPHYLAXIS: mechanical only  ADMISSION STATUS: Inpatient  CODE STATUS: DO NOT RESUSCITATE, though I discussed with the patient in fact that if some surgical intervention is decided on she will likely need to reverse this for the surgical procedure to occur.  TOTAL TIME TAKING CARE OF THIS PATIENT: 50 minutes.    Marie Clarke FIELDING 08/09/2014, 11:03 PM  [facility]  Office  [phone]  CC: Primary care physician; Sharyne Peach, MD

## 2014-08-09 NOTE — Telephone Encounter (Signed)
Angie, please call patient - she is wanting to speak with you about her referral to [facility]. She saw Dr Pat Patrick in office last week... is having problems with her prolapsed bowel. He wants to refer her to [facility]. She has called multiple times today because she is now having complications with her prolapsed bowel and wants to make sure the referral has been sent and to see if we can expedite getting her in to be seen. Please call and advise.

## 2014-08-09 NOTE — ED Notes (Signed)
Patient up to bathroom, rectal prolapse observed and small amount of blood noted on toilet paper.  States she is still having a lot of pain and difficulty urinating at times.  Dr. Clearnce Hasten also observed prolapse.

## 2014-08-09 NOTE — ED Provider Notes (Signed)
[facility] Emergency Department Provider Note  ____________________________________________  Time seen: Seen on arrival to the emergency department  I have reviewed the triage vital signs and the nursing notes.   HISTORY  Chief Complaint Rectal Bleeding    HPI Marie Clarke is a 44 y.o. female with a history of CAD and rectal prolapse presents today with rectal pain and bright red blood per rectum. The patient says that she has had pain and bleeding from her rectum for the past several weeks. However, today she says that she had a larger amount of bleeding than normal and her pain increased. She says that the pain is radiating from her rectum up through to her lower abdomen. She has been seen by Dr. Renda Rolls in the office who has suggested that she report to [facility] for colorectal consult for surgical repair. She has already had a surgical repair several years in the past in [place]. She is on Plavix but has not taken it for the past 5 days secondary to bleeding. She has been taking Tylenol at home for pain control without relief. She denies any chest pain, nausea vomiting. No lightheadedness or near syncopal episodes.   Past Medical History  Diagnosis Date  . CAD (coronary artery disease) unk  . Diabetes mellitus without complication   . Hypertension   . Depression unk  . Pancreatitis unk  . Thyroid disease   . TIA (transient ischemic attack) unk    Patient Active Problem List   Diagnosis Date Noted  . 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 Arteriosclerosis of coronary artery 08/05/2014  . CCF (congestive cardiac failure) 08/05/2014  . Chronic kidney disease 08/05/2014  . 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 Apnea, sleep 08/05/2014  . Temporary cerebral vascular dysfunction 08/05/2014  . Chronic LBP 04/29/2014  . Polypharmacy 04/29/2014  . Left leg pain 04/29/2014  . 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 Chronic kidney disease, stage III (moderate)  12/14/2013  . Controlled diabetes mellitus type II without complication 98/12/9145  . Essential (primary) hypertension 12/03/2013  . Adult hypothyroidism 12/03/2013    Past Surgical History  Procedure Laterality Date  . Prolapse rectum surgery N/A   . Abdominal hysterectomy      Current Outpatient Rx  Name  Route  Sig  Dispense  Refill  . albuterol (PROAIR HFA) 108 (90 BASE) MCG/ACT inhaler   Inhalation   Inhale into the lungs.         Marland Kitchen atorvastatin (LIPITOR) 40 MG tablet   Oral   Take 40 mg by mouth daily.         . benzonatate (TESSALON) 100 MG capsule   Oral   Take 200 mg by mouth 3 (three) times daily.         . clopidogrel (PLAVIX) 75 MG tablet   Oral   Take 75 mg by mouth daily.         Marland Kitchen dicyclomine (BENTYL) 10 MG capsule   Oral   Take 10 mg by mouth 4 (four) times daily -  before meals and at bedtime.         . fluticasone-salmeterol (ADVAIR HFA) 230-21 MCG/ACT inhaler   Inhalation   Inhale into the lungs.         Marland Kitchen glipiZIDE (GLUCOTROL XL) 10 MG 24 hr tablet   Oral   Take 10 mg by mouth daily with breakfast.         . levocetirizine (XYZAL) 5 MG tablet   Oral  Take 5 mg by mouth every evening.         Marland Kitchen levothyroxine (SYNTHROID, LEVOTHROID) 200 MCG tablet   Oral   Take 200 mcg by mouth daily before breakfast.         . metoprolol (LOPRESSOR) 50 MG tablet   Oral   Take 25 mg by mouth 2 (two) times daily.         . montelukast (SINGULAIR) 10 MG tablet   Oral   Take 10 mg by mouth at bedtime.         . Olopatadine HCl 0.2 % SOLN   Ophthalmic   Apply 1 drop to eye daily. Each eye         . ondansetron (ZOFRAN-ODT) 4 MG disintegrating tablet   Oral   Take 4 mg by mouth every 4 (four) hours as needed for nausea or vomiting.         Marland Kitchen oxybutynin (DITROPAN-XL) 10 MG 24 hr tablet   Oral   Take 10 mg by mouth at bedtime.         Marland Kitchen oxyCODONE (ROXICODONE) 5 MG immediate release tablet   Oral   Take 1 tablet (5 mg  total) by mouth every 8 (eight) hours as needed. Patient not taking: Reported on 08/05/2014   20 tablet   0   . Pancrelipase, Lip-Prot-Amyl, 24000 UNITS CPEP   Oral   Take 3 capsules by mouth 3 (three) times daily.         . polyethylene glycol (MIRALAX) packet   Oral   Take 17 g by mouth daily. Patient not taking: Reported on 08/05/2014   14 each   0   . pregabalin (LYRICA) 100 MG capsule   Oral   Take 100 mg by mouth 3 (three) times daily.         . ranitidine (ZANTAC) 300 MG tablet   Oral   Take 300 mg by mouth at bedtime.         . ranolazine (RANEXA) 500 MG 12 hr tablet   Oral   Take 500 mg by mouth 2 (two) times daily.         Marland Kitchen tiZANidine (ZANAFLEX) 4 MG tablet   Oral   Take 4 mg by mouth 3 (three) times daily.         Marland Kitchen topiramate (TOPAMAX) 100 MG tablet   Oral   Take 100 mg by mouth 3 (three) times daily.         Marland Kitchen torsemide (DEMADEX) 20 MG tablet   Oral   Take 20 mg by mouth daily.         Marland Kitchen venlafaxine XR (EFFEXOR-XR) 150 MG 24 hr capsule   Oral   Take 150 mg by mouth daily with breakfast.         . venlafaxine XR (EFFEXOR-XR) 37.5 MG 24 hr capsule   Oral   Take 37.5 mg by mouth daily with breakfast.         . Witch Hazel (TUCKS) 50 % PADS   Apply externally   Apply 1 application topically 3 (three) times daily.   40 each   0     Allergies Flagyl; Sulfa antibiotics; Azithromycin; Ciprofloxacin; Levofloxacin; and Vancomycin  History reviewed. No pertinent family history.  Social History History  Substance Use Topics  . Smoking status: Former Research scientist (life sciences)  . Smokeless tobacco: Never Used  . Alcohol Use: No    Review of Systems Constitutional: No fever/chills Eyes: No visual changes.  ENT: No sore throat. Cardiovascular: Denies chest pain. Respiratory: Denies shortness of breath. Gastrointestinal: Suprapubic pain radiating from rectum. No nausea, no vomiting.  No diarrhea.  No constipation. Genitourinary: Negative for  dysuria. Musculoskeletal: Negative for back pain. Skin: Negative for rash. Neurological: Negative for headaches, focal weakness or numbness.  10-point ROS otherwise negative.  ____________________________________________   PHYSICAL EXAM:  VITAL SIGNS: ED Triage Vitals  Enc Vitals Group     BP 08/09/14 2000 103/70 mmHg     Pulse Rate 08/09/14 2000 78     Resp 08/09/14 2000 22     Temp 08/09/14 2000 98.2 F (36.8 C)     Temp Source 08/09/14 2000 Oral     SpO2 08/09/14 2000 100 %     Weight 08/09/14 2000 220 lb (99.791 kg)     Height 08/09/14 2000 5\' 4"  (1.626 m)     Head Cir --      Peak Flow --      Pain Score 08/09/14 2001 10     Pain Loc --      Pain Edu? --      Excl. in [place]? --     Constitutional: Alert and oriented. Well appearing and in no acute distress. Eyes: Conjunctivae are normal. PERRL. EOMI. Head: Atraumatic. Nose: No congestion/rhinnorhea. Mouth/Throat: Mucous membranes are moist.  Oropharynx non-erythematous. Neck: No stridor.   Cardiovascular: Normal rate, regular rhythm. Grossly normal heart sounds.  Good peripheral circulation. Respiratory: Normal respiratory effort.  No retractions. Lungs CTAB. Gastrointestinal: Soft with mild tenderness to the suprapubic region of the abdomen. No distention. No abdominal bruits. No CVA tenderness. Rectal exam without prolapse upon initial presentation. Small amount of watery blood at the external sphincter. Musculoskeletal: No lower extremity tenderness nor edema.  No joint effusions. Neurologic:  Normal speech and language. No gross focal neurologic deficits are appreciated. Speech is normal. No gait instability. Skin:  Skin is warm, dry and intact. No rash noted. Psychiatric: Mood and affect are normal. Speech and behavior are normal.  ____________________________________________   LABS (all labs ordered are listed, but only abnormal results are displayed)  Labs Reviewed  CBC WITH DIFFERENTIAL/PLATELET -  Abnormal; Notable for the following:    RBC 3.60 (*)    Hemoglobin 10.6 (*)    HCT 32.2 (*)    All other components within normal limits  COMPREHENSIVE METABOLIC PANEL - Abnormal; Notable for the following:    Glucose, Bld 101 (*)    BUN 26 (*)    Creatinine, Ser 1.19 (*)    AST 14 (*)    ALT 13 (*)    GFR calc non Af Amer 55 (*)    All other components within normal limits  LIPASE, BLOOD  TROPONIN I  TYPE AND SCREEN  ABO/RH   ____________________________________________  EKG  ED ECG REPORT I, Doran Stabler, the attending physician, personally viewed and interpreted this ECG.   Date: 08/09/2014  EKG Time: 2002  Rate: 81  Rhythm: normal EKG, normal sinus rhythm  Axis: Normal axis  Intervals:none  ST&T Change: No ST elevations or depressions. No abnormal T-wave inversions.  ____________________________________________  RADIOLOGY  Chest x-ray without any acute pulmonary process. I personally reviewed the images. ____________________________________________   PROCEDURES    ____________________________________________   INITIAL IMPRESSION / ASSESSMENT AND PLAN / ED COURSE  Pertinent labs & imaging results that were available during my care of the patient were reviewed by me and considered in my medical decision making (see chart  for details).  ----------------------------------------- 10:22 PM on 08/09/2014 ----------------------------------------- Patient with reassuring hemoglobin but several episodes of hypotension. Says has had 2 additional episodes of a small amount of bloody bowel movement. We'll admit for GI bleed. Last blood pressure with systolic of 213. Stable at this time.  ____________________________________________   FINAL CLINICAL IMPRESSION(S) / ED DIAGNOSES  Acute on chronic rectal prolapse. Acute lower GI bleeding. Initial visit.    Orbie Pyo, MD 08/09/14 2224

## 2014-08-09 NOTE — Telephone Encounter (Signed)
-----   Message from Madelaine Bhat, RN sent at 08/06/2014  2:06 PM EDT ----- Regarding: Referral to [facility] Patient needs to be referred to [facility] colorectal surgery for prolapsed rectum. Call patient with information.  Thank you

## 2014-08-09 NOTE — Telephone Encounter (Signed)
Referral has been sent to [facility] Colorectal Surgery for her prolapsed rectum. Fax# [phone] Attn to [facility]. Pt has been advised that [facility] will call her with an appt date and time.

## 2014-08-09 NOTE — ED Notes (Signed)
Arrived via EMS from home. Patient states she has had rectal bleeding for the past 3 weeks. She was seen here before.  Has a history of rectal prolapse per Dr. Pat Patrick per patient's report.  She states that over the weekend she has gone through 10 pads and tonight from 658pm to 715pm she soaked through 2 pads losing about 100cc of blood. She was referred to Dr. Pat Patrick who was going to refer her to [facility] for further testing.  Patient c/o extreme pain and tenderness to rectum. Has been on Plavix but stopped taking 5 days ago.

## 2014-08-10 ENCOUNTER — Encounter: Payer: Self-pay | Admitting: *Deleted

## 2014-08-10 LAB — HEMOGLOBIN: Hemoglobin: 10.8 g/dL — ABNORMAL LOW (ref 12.0–16.0)

## 2014-08-10 LAB — CBC
HCT: 33.2 % — ABNORMAL LOW (ref 35.0–47.0)
Hemoglobin: 10.9 g/dL — ABNORMAL LOW (ref 12.0–16.0)
MCH: 29.5 pg (ref 26.0–34.0)
MCHC: 32.7 g/dL (ref 32.0–36.0)
MCV: 90.1 fL (ref 80.0–100.0)
PLATELETS: 198 10*3/uL (ref 150–440)
RBC: 3.69 MIL/uL — ABNORMAL LOW (ref 3.80–5.20)
RDW: 14.5 % (ref 11.5–14.5)
WBC: 8.9 10*3/uL (ref 3.6–11.0)

## 2014-08-10 LAB — BASIC METABOLIC PANEL
Anion gap: 9 (ref 5–15)
BUN: 22 mg/dL — AB (ref 6–20)
CHLORIDE: 110 mmol/L (ref 101–111)
CO2: 24 mmol/L (ref 22–32)
Calcium: 8.9 mg/dL (ref 8.9–10.3)
Creatinine, Ser: 1.02 mg/dL — ABNORMAL HIGH (ref 0.44–1.00)
GFR calc Af Amer: >60 mL/min (ref >60)
Glucose, Bld: 165 mg/dL — ABNORMAL HIGH (ref 65–99)
POTASSIUM: 3.6 mmol/L (ref 3.5–5.1)
SODIUM: 143 mmol/L (ref 135–145)

## 2014-08-10 LAB — GLUCOSE, CAPILLARY
GLUCOSE-CAPILLARY: 164 mg/dL — AB (ref 65–99)
Glucose-Capillary: 181 mg/dL — ABNORMAL HIGH (ref 65–99)
Glucose-Capillary: 104 mg/dL — ABNORMAL HIGH (ref 65–99)
Glucose-Capillary: 43 mg/dL — CL (ref 65–99)
Glucose-Capillary: 69 mg/dL (ref 65–99)
Glucose-Capillary: 99 mg/dL (ref 65–99)

## 2014-08-10 LAB — ABO/RH: ABO/RH(D): A NEG

## 2014-08-10 MED ORDER — OLOPATADINE HCL 0.1 % OP SOLN
1.0000 [drp] | Freq: Two times a day (BID) | OPHTHALMIC | Status: DC
  Administered 2014-08-10: 1 [drp] via OPHTHALMIC
  Filled 2014-08-10: qty 5

## 2014-08-10 MED ORDER — OXYBUTYNIN CHLORIDE ER 10 MG PO TB24
10.0000 mg | ORAL_TABLET | Freq: Every day | ORAL | Status: DC
  Administered 2014-08-10 (×2): 10 mg via ORAL
  Filled 2014-08-10 (×3): qty 1

## 2014-08-10 MED ORDER — LOPERAMIDE HCL 2 MG PO CAPS
2.0000 mg | ORAL_CAPSULE | Freq: Four times a day (QID) | ORAL | Status: DC | PRN
Start: 2014-08-10 — End: 2014-08-11
  Administered 2014-08-10 – 2014-08-11 (×3): 2 mg via ORAL
  Filled 2014-08-10 (×3): qty 1

## 2014-08-10 MED ORDER — DEXTROSE 50 % IV SOLN
1.0000 | Freq: Once | INTRAVENOUS | Status: DC
  Filled 2014-08-10: qty 50

## 2014-08-10 MED ORDER — SODIUM CHLORIDE 0.9 % IV BOLUS (SEPSIS)
1000.0000 mL | Freq: Once | INTRAVENOUS | Status: AC
  Administered 2014-08-10: 1000 mL via INTRAVENOUS

## 2014-08-10 MED ORDER — MORPHINE SULFATE 2 MG/ML IJ SOLN
2.0000 mg | INTRAMUSCULAR | Status: DC | PRN
  Administered 2014-08-10 – 2014-08-11 (×5): 2 mg via INTRAVENOUS
  Filled 2014-08-10 (×5): qty 1

## 2014-08-10 MED ORDER — VENLAFAXINE HCL ER 75 MG PO CP24
150.0000 mg | ORAL_CAPSULE | Freq: Every day | ORAL | Status: DC
  Administered 2014-08-10 – 2014-08-11 (×2): 150 mg via ORAL
  Filled 2014-08-10 (×2): qty 2

## 2014-08-10 MED ORDER — DOCUSATE SODIUM 100 MG PO CAPS
100.0000 mg | ORAL_CAPSULE | Freq: Two times a day (BID) | ORAL | Status: DC
  Filled 2014-08-10 (×2): qty 1

## 2014-08-10 MED ORDER — SODIUM CHLORIDE 0.9 % IJ SOLN
3.0000 mL | Freq: Two times a day (BID) | INTRAMUSCULAR | Status: DC
  Administered 2014-08-10 – 2014-08-11 (×3): 3 mL via INTRAVENOUS

## 2014-08-10 MED ORDER — INSULIN ASPART 100 UNIT/ML ~~LOC~~ SOLN
0.0000 [IU] | Freq: Four times a day (QID) | SUBCUTANEOUS | Status: DC
  Administered 2014-08-10 (×2): 2 [IU] via SUBCUTANEOUS
  Filled 2014-08-10 (×2): qty 2

## 2014-08-10 MED ORDER — TOPIRAMATE 100 MG PO TABS
100.0000 mg | ORAL_TABLET | Freq: Three times a day (TID) | ORAL | Status: DC
  Administered 2014-08-10 – 2014-08-11 (×4): 100 mg via ORAL
  Filled 2014-08-10 (×4): qty 1

## 2014-08-10 MED ORDER — MONTELUKAST SODIUM 10 MG PO TABS
10.0000 mg | ORAL_TABLET | Freq: Every day | ORAL | Status: DC
  Administered 2014-08-10: 10 mg via ORAL
  Filled 2014-08-10: qty 1

## 2014-08-10 MED ORDER — PANTOPRAZOLE SODIUM 40 MG PO TBEC
40.0000 mg | DELAYED_RELEASE_TABLET | Freq: Every day | ORAL | Status: DC
  Administered 2014-08-10 – 2014-08-11 (×2): 40 mg via ORAL
  Filled 2014-08-10 (×2): qty 1

## 2014-08-10 MED ORDER — LEVOTHYROXINE SODIUM 100 MCG PO TABS
200.0000 ug | ORAL_TABLET | Freq: Every day | ORAL | Status: DC
  Administered 2014-08-10 – 2014-08-11 (×2): 200 ug via ORAL
  Filled 2014-08-10 (×2): qty 2

## 2014-08-10 MED ORDER — PANCRELIPASE (LIP-PROT-AMYL) 12000-38000 UNITS PO CPEP
72000.0000 [IU] | ORAL_CAPSULE | Freq: Three times a day (TID) | ORAL | Status: DC
  Administered 2014-08-10 – 2014-08-11 (×4): 72000 [IU] via ORAL
  Filled 2014-08-10 (×4): qty 6

## 2014-08-10 MED ORDER — PREGABALIN 50 MG PO CAPS
100.0000 mg | ORAL_CAPSULE | Freq: Three times a day (TID) | ORAL | Status: DC
  Administered 2014-08-10 – 2014-08-11 (×4): 100 mg via ORAL
  Filled 2014-08-10 (×4): qty 2

## 2014-08-10 MED ORDER — DICYCLOMINE HCL 10 MG PO CAPS
10.0000 mg | ORAL_CAPSULE | Freq: Three times a day (TID) | ORAL | Status: DC
  Administered 2014-08-10 – 2014-08-11 (×3): 10 mg via ORAL
  Filled 2014-08-10 (×3): qty 1

## 2014-08-10 MED ORDER — MOMETASONE FURO-FORMOTEROL FUM 200-5 MCG/ACT IN AERO
2.0000 | INHALATION_SPRAY | Freq: Two times a day (BID) | RESPIRATORY_TRACT | Status: DC
  Administered 2014-08-10 – 2014-08-11 (×4): 2 via RESPIRATORY_TRACT
  Filled 2014-08-10: qty 8.8

## 2014-08-10 MED ORDER — RANOLAZINE ER 500 MG PO TB12
500.0000 mg | ORAL_TABLET | Freq: Two times a day (BID) | ORAL | Status: DC
  Administered 2014-08-10 – 2014-08-11 (×4): 500 mg via ORAL
  Filled 2014-08-10 (×4): qty 1

## 2014-08-10 MED ORDER — OXYCODONE HCL 5 MG PO TABS: 5.0000 mg | ORAL_TABLET | Freq: Three times a day (TID) | ORAL | Status: DC | PRN

## 2014-08-10 MED ORDER — ATORVASTATIN CALCIUM 20 MG PO TABS
40.0000 mg | ORAL_TABLET | Freq: Every day | ORAL | Status: DC
  Administered 2014-08-10 – 2014-08-11 (×2): 40 mg via ORAL
  Filled 2014-08-10 (×2): qty 2

## 2014-08-10 MED ORDER — SODIUM CHLORIDE 0.9 % IV SOLN
INTRAVENOUS | Status: AC
  Administered 2014-08-10: 02:00:00 via INTRAVENOUS

## 2014-08-10 MED ORDER — ACETAMINOPHEN 325 MG PO TABS: 650.0000 mg | ORAL_TABLET | Freq: Four times a day (QID) | ORAL | Status: DC | PRN

## 2014-08-10 MED ORDER — ALBUTEROL SULFATE (2.5 MG/3ML) 0.083% IN NEBU
3.0000 mL | INHALATION_SOLUTION | RESPIRATORY_TRACT | Status: DC | PRN
  Administered 2014-08-10: 3 mL via RESPIRATORY_TRACT
  Filled 2014-08-10: qty 3

## 2014-08-10 NOTE — Progress Notes (Signed)
Pt a&o, VSS, NSR on tele. Pt complained of abdominal and rectal pain in which morphine was given with positive affect. Blood smears still show on chucks under pt, to indicate active bleeding. Pt started on clear liquid diet.However Hgb remains stable. Pts cbg at noon was 164, 2units given and 2hrs later cbg 43. Orange juice given and cbg came up to 69. Amp of D50 ordered but pt insisted on oral intake to raise cbg. Cbg raised to 99 and amp not given. Pt currently awaiting bed at [facility] for GI surg.

## 2014-08-10 NOTE — Progress Notes (Signed)
Skin assessment verified by Luanna Salk. Conley Simmonds, RN

## 2014-08-10 NOTE — Discharge Summary (Addendum)
[place] at [place] NAME: Marie Clarke    MR#:  [id number]  DATE OF BIRTH:  [birth year]  DATE OF ADMISSION:  08/09/2014 ADMITTING PHYSICIAN: Lance Coon, MD  DATE OF DISCHARGE: 08/11/2014  PRIMARY CARE PHYSICIAN: Sharyne Peach, MD    ADMISSION DIAGNOSIS:  Rectal prolapse [K62.3] Lower GI bleeding [K92.2]  DISCHARGE DIAGNOSIS:  Rectal Prolapse with rectal bleed  SECONDARY DIAGNOSIS:   Past Medical History  Diagnosis Date  . CAD (coronary artery disease) unk  . Diabetes mellitus without complication   . Hypertension   . Depression unk  . Pancreatitis unk  . Thyroid disease   . TIA (transient ischemic attack) unk    HOSPITAL COURSE:  Marie Clarke is a 44 y.o. female who presents with rectal bleeding in the setting of rectal prolapse. Patient has a known history of rectal prolapse, and has had a surgical procedure for this in the past.  *Rectal bleeding with h/o Rectal prolapse   -H and h stable -BP stable -spoke with Dr Harlon Ditty and has accepted pt to his service at [facility] surgery) -pt aware and agreeable for transfer  *   diabetes mellitus type II without complication  -CLD -SSI and po glipizide  * Essential (primary) hypertension - chronic problem, hold her home antihypertensives as her blood pressures actually borderline to low right now.  * Affective bipolar disorder - continue home meds  * Arteriosclerosis of coronary artery -cont home meds except for holding her Plavix now due to her GI bleed.  *Chronic kidney disease, stage III (moderate) - monitor, avoid nephrotoxins  * hypothyroidism - continue home thyroid replacement  DISCHARGE CONDITIONS:   fair  CONSULTS OBTAINED:     Gen surgery -Dr Pat Patrick DRUG ALLERGIES:   Allergies  Allergen Reactions  . Flagyl [Metronidazole] Hives  . Sulfa Antibiotics Hives  . Amoxicillin Rash  . Azithromycin Rash  . Cephalexin Rash  . Ciprofloxacin Rash  .  Levofloxacin Rash  . Vancomycin Rash    DISCHARGE MEDICATIONS:   Current Discharge Medication List    CONTINUE these medications which have NOT CHANGED   Details  albuterol (PROAIR HFA) 108 (90 BASE) MCG/ACT inhaler Inhale into the lungs.    atorvastatin (LIPITOR) 40 MG tablet Take 40 mg by mouth daily.    benzonatate (TESSALON) 100 MG capsule Take 200 mg by mouth 3 (three) times daily.    clopidogrel (PLAVIX) 75 MG tablet Take 75 mg by mouth daily.    dicyclomine (BENTYL) 10 MG capsule Take 10 mg by mouth 4 (four) times daily -  before meals and at bedtime.    fluticasone-salmeterol (ADVAIR HFA) 230-21 MCG/ACT inhaler Inhale into the lungs.    glipiZIDE (GLUCOTROL XL) 10 MG 24 hr tablet Take 10 mg by mouth daily with breakfast.    levocetirizine (XYZAL) 5 MG tablet Take 5 mg by mouth every evening.    levothyroxine (SYNTHROID, LEVOTHROID) 200 MCG tablet Take 200 mcg by mouth daily before breakfast.    metoprolol (LOPRESSOR) 50 MG tablet Take 25 mg by mouth 2 (two) times daily.    montelukast (SINGULAIR) 10 MG tablet Take 10 mg by mouth at bedtime.    Olopatadine HCl 0.2 % SOLN Apply 1 drop to eye daily. Each eye    ondansetron (ZOFRAN-ODT) 4 MG disintegrating tablet Take 4 mg by mouth every 4 (four) hours as needed for nausea or vomiting.    oxybutynin (DITROPAN-XL) 10 MG 24 hr tablet  Take 10 mg by mouth at bedtime.    oxyCODONE (ROXICODONE) 5 MG immediate release tablet Take 1 tablet (5 mg total) by mouth every 8 (eight) hours as needed. Qty: 20 tablet, Refills: 0    Pancrelipase, Lip-Prot-Amyl, 24000 UNITS CPEP Take 3 capsules by mouth 3 (three) times daily.    polyethylene glycol (MIRALAX) packet Take 17 g by mouth daily. Qty: 14 each, Refills: 0    pregabalin (LYRICA) 100 MG capsule Take 100 mg by mouth 3 (three) times daily.    ranitidine (ZANTAC) 300 MG tablet Take 300 mg by mouth at bedtime.    ranolazine (RANEXA) 500 MG 12 hr tablet Take 500 mg by mouth 2  (two) times daily.    tiZANidine (ZANAFLEX) 4 MG tablet Take 4 mg by mouth 3 (three) times daily.    topiramate (TOPAMAX) 100 MG tablet Take 100 mg by mouth 3 (three) times daily.    torsemide (DEMADEX) 20 MG tablet Take 20 mg by mouth daily.    !! venlafaxine XR (EFFEXOR-XR) 150 MG 24 hr capsule Take 150 mg by mouth daily with breakfast.    !! venlafaxine XR (EFFEXOR-XR) 37.5 MG 24 hr capsule Take 37.5 mg by mouth daily with breakfast.    Witch Hazel (TUCKS) 50 % PADS Apply 1 application topically 3 (three) times daily. Qty: 40 each, Refills: 0     !! - Potential duplicate medications found. Please discuss with provider.     If you experience worsening of your admission symptoms, develop shortness of breath, life threatening emergency, suicidal or homicidal thoughts you must seek medical attention immediately by calling 911 or calling your MD immediately  if symptoms less severe.  You Must read complete instructions/literature along with all the possible adverse reactions/side effects for all the Medicines you take and that have been prescribed to you. Take any new Medicines after you have completely understood and accept all the possible adverse reactions/side effects.   Please note  You were cared for by a hospitalist during your hospital stay. If you have any questions about your discharge medications or the care you received while you were in the hospital after you are discharged, you can call the unit and asked to speak with the hospitalist on call if the hospitalist that took care of you is not available. Once you are discharged, your primary care physician will handle any further medical issues. Please note that NO REFILLS for any discharge medications will be authorized once you are discharged, as it is imperative that you return to your primary care physician (or establish a relationship with a primary care physician if you do not have one) for your aftercare needs so that they can  reassess your need for medications and monitor your lab values. Today   SUBJECTIVE   Has rectal bleed. Feeling hungry  VITAL SIGNS:  Blood pressure 98/50, pulse 74, temperature 97.7 F (36.5 C), temperature source Oral, resp. rate 17, height 5\' 4"  (1.626 m), weight 99.791 kg (220 lb), SpO2 97 %.  I/O:   Intake/Output Summary (Last 24 hours) at 08/10/14 0923 Last data filed at 08/10/14 0830  Gross per 24 hour  Intake 1288.42 ml  Output    125 ml  Net 1163.42 ml    PHYSICAL EXAMINATION:  GENERAL:  44 y.o.-year-old patient lying in the bed with no acute distress.  EYES: Pupils equal, round, reactive to light and accommodation. No scleral icterus. Extraocular muscles intact.  HEENT: Head atraumatic, normocephalic. Oropharynx and nasopharynx clear.  NECK:  Supple, no jugular venous distention. No thyroid enlargement, no tenderness.  LUNGS: Normal breath sounds bilaterally, no wheezing, rales,rhonchi or crepitation. No use of accessory muscles of respiration.  CARDIOVASCULAR: S1, S2 normal. No murmurs, rubs, or gallops.  ABDOMEN: Soft, non-tender, non-distended. Bowel sounds present. No organomegaly or mass.  EXTREMITIES: No pedal edema, cyanosis, or clubbing.  NEUROLOGIC: Cranial nerves II through XII are intact. Muscle strength 5/5 in all extremities. Sensation intact. Gait not checked.  PSYCHIATRIC: The patient is alert and oriented x 3.  SKIN: No obvious rash, lesion, or ulcer.   DATA REVIEW:   CBC   Recent Labs Lab 08/10/14 0129  WBC 8.9  HGB 10.9*  10.8*  HCT 33.2*  PLT 198    Chemistries   Recent Labs Lab 08/09/14 2014 08/10/14 0129  NA 140 143  K 3.5 3.6  CL 108 110  CO2 23 24  GLUCOSE 101* 165*  BUN 26* 22*  CREATININE 1.19* 1.02*  CALCIUM 8.9 8.9  AST 14*  --   ALT 13*  --   ALKPHOS 85  --   BILITOT 0.3  --     Microbiology Results   No results found for this or any previous visit (from the past 240 hour(s)).  RADIOLOGY:  Dg Chest 1  View  08/09/2014   CLINICAL DATA:  Abdominal pain and rectal bleeding.  EXAM: CHEST  1 VIEW  COMPARISON:  02/22/2014  FINDINGS: The cardiomediastinal contours are normal. Pulmonary vasculature is normal for technique. No consolidation, pleural effusion, or pneumothorax. No acute osseous abnormalities are seen.  IMPRESSION: No acute pulmonary process.   Electronically Signed   By: Jeb Levering M.D.   On: 08/09/2014 20:36     Management plans discussed with the patient, family and they are in agreement.  CODE STATUS:     Code Status Orders        Start     Ordered   08/10/14 0101  Do not attempt resuscitation (DNR)   Continuous    Question Answer Comment  In the event of cardiac or respiratory ARREST Do not call a "code blue"   In the event of cardiac or respiratory ARREST Do not perform Intubation, CPR, defibrillation or ACLS   In the event of cardiac or respiratory ARREST Use medication by any route, position, wound care, and other measures to relive pain and suffering. May use oxygen, suction and manual treatment of airway obstruction as needed for comfort.      08/10/14 0100      TOTAL TIME TAKING CARE OF THIS PATIENT: 40 minutes.    Eiden Bagot M.D on 08/11/2014   Between 7am to 6pm - Pager - [phone] After 6pm go to [email or web address] - password [email or web address] [place] [facility]  Office  [phone]  CC: Primary care physician; Sharyne Peach, MD

## 2014-08-10 NOTE — Care Management (Signed)
Informed patient is for transfer to [facility].  Patient it is anticipated that patient will travel by [facility].  Informed charge nurse that non emergent transfer form completed and in [facility] for printing

## 2014-08-11 LAB — HEMOGLOBIN: HEMOGLOBIN: 10.5 g/dL — AB (ref 12.0–16.0)

## 2014-08-11 LAB — GLUCOSE, CAPILLARY
GLUCOSE-CAPILLARY: 109 mg/dL — AB (ref 65–99)
Glucose-Capillary: 110 mg/dL — ABNORMAL HIGH (ref 65–99)

## 2014-08-11 MED ORDER — TIZANIDINE HCL 4 MG PO TABS
4.0000 mg | ORAL_TABLET | Freq: Three times a day (TID) | ORAL | Status: DC
  Administered 2014-08-11: 4 mg via ORAL
  Filled 2014-08-11: qty 1

## 2014-08-11 NOTE — Progress Notes (Signed)
MD paged, Dr. Verdell Carmine, pt requesting her home medication bentyl and also some imodium, pt states she's having diarrhea and it's making her prolapsed rectum worse. Verbal orders received, to continue home dose bentyl & add 2mg  imodium 4 times a day as needed. Will continue to monitor. Conley Simmonds, RN

## 2014-08-11 NOTE — Progress Notes (Addendum)
[facility] [place] NAME: Marie Clarke    MR#:  [id number]  DATE OF BIRTH:  08-31-1970  SUBJECTIVE:  Mild rectal bleed this am.   REVIEW OF SYSTEMS:   Review of Systems  Constitutional: Negative for fever, chills and weight loss.  HENT: Negative for ear discharge, ear pain and nosebleeds.   Eyes: Negative for blurred vision, pain and discharge.  Respiratory: Negative for sputum production, shortness of breath, wheezing and stridor.   Cardiovascular: Negative for chest pain, palpitations, orthopnea and PND.  Gastrointestinal: Positive for blood in stool. Negative for nausea, vomiting, abdominal pain and diarrhea.  Genitourinary: Negative for urgency and frequency.  Musculoskeletal: Negative for back pain and joint pain.  Neurological: Negative for sensory change, speech change, focal weakness and weakness.  Psychiatric/Behavioral: Negative for depression and hallucinations. The patient is not nervous/anxious.    Tolerating Diet:yes Tolerating PT: not needed  DRUG ALLERGIES:   Allergies  Allergen Reactions  . Azithromycin Hives  . Cephalexin Hives  . Ciprofloxacin Hives  . Flagyl [Metronidazole] Hives  . Levofloxacin Hives  . Sulfa Antibiotics Hives  . Vancomycin Hives    VITALS:  Blood pressure 122/67, pulse 84, temperature 98.3 F (36.8 C), temperature source Oral, resp. rate 20, height 5\' 4"  (1.626 m), weight 99.791 kg (220 lb), SpO2 100 %.  PHYSICAL EXAMINATION:   Physical Exam  GENERAL:  44 y.o.-year-old patient lying in the bed with no acute distress.  EYES: Pupils equal, round, reactive to light and accommodation. No scleral icterus. Extraocular muscles intact.  HEENT: Head atraumatic, normocephalic. Oropharynx and nasopharynx clear.  NECK:  Supple, no jugular venous distention. No thyroid enlargement, no tenderness.  LUNGS: Normal breath sounds bilaterally, no wheezing, rales, rhonchi. No use of accessory  muscles of respiration.  CARDIOVASCULAR: S1, S2 normal. No murmurs, rubs, or gallops.  ABDOMEN: Soft, nontender, nondistended. Bowel sounds present. No organomegaly or mass.  EXTREMITIES: No cyanosis, clubbing or edema b/l.    NEUROLOGIC: Cranial nerves II through XII are intact. No focal Motor or sensory deficits b/l.   PSYCHIATRIC: The patient is alert and oriented x 3.  SKIN: No obvious rash, lesion, or ulcer.    LABORATORY PANEL:   CBC  Recent Labs Lab 08/10/14 0129 08/11/14 0827  WBC 8.9  --   HGB 10.9*  10.8* 10.5*  HCT 33.2*  --   PLT 198  --     Chemistries   Recent Labs Lab 08/09/14 2014 08/10/14 0129  NA 140 143  K 3.5 3.6  CL 108 110  CO2 23 24  GLUCOSE 101* 165*  BUN 26* 22*  CREATININE 1.19* 1.02*  CALCIUM 8.9 8.9  AST 14*  --   ALT 13*  --   ALKPHOS 85  --   BILITOT 0.3  --     Cardiac Enzymes  Recent Labs Lab 08/09/14 2014  TROPONINI <0.03    RADIOLOGY:  Dg Chest 1 View  08/09/2014   CLINICAL DATA:  Abdominal pain and rectal bleeding.  EXAM: CHEST  1 VIEW  COMPARISON:  02/22/2014  FINDINGS: The cardiomediastinal contours are normal. Pulmonary vasculature is normal for technique. No consolidation, pleural effusion, or pneumothorax. No acute osseous abnormalities are seen.  IMPRESSION: No acute pulmonary process.   Electronically Signed   By: Jeb Levering M.D.   On: 08/09/2014 20:36    ASSESSMENT AND PLAN:   Marie Clarke is a 44 y.o. female who presents with rectal bleeding in  the setting of rectal prolapse. Patient has a known history of rectal prolapse, and has had a surgical procedure for this in the past.  *Rectal bleeding with h/o Rectal prolapse  -H and h stable remains 10.5 -small bleeds intermittently -BP stable -spoke with Dr Harlon Ditty and has accepted pt to his service at [facility] surgery) -pt aware and agreeable for transfer -no beds at [facility] yet. Spoke with Financial risk analyst and she has no idea if one will be available  today also. Medical problems stable.   * diabetes mellitus type II without complication  -FLD -SSI and po glipizide  * Essential (primary) hypertension - chronic problem, hold her home antihypertensives as her blood pressures actually borderline to low right now.  * Affective bipolar disorder - continue home meds  * Arteriosclerosis of coronary artery -cont home meds except for holding her Plavix now due to her GI bleed.  *Chronic kidney disease, stage III (moderate)  - avoid nephrotoxins  * hypothyroidism - continue home thyroid replacement  Case discussed with Care Management/Social Worker. Management plans discussed with the patient, family and they are in agreement.  CODE STATUS: full  DVT Prophylaxis: ambulatory, TED  TOTAL TIME TAKING CARE OF THIS PATIENT: 25 minutes.  >50% time spent on counselling and coordination of care   Tyeisha Dinan M.D on 08/10/2014 at 12:51 PM  Between 7am to 6pm - Pager - [phone]  After 6pm go to [email or web address] - password [email or web address] [facility]  Office  [phone]  CC: Primary care physician; Sharyne Peach, MD

## 2014-08-11 NOTE — Discharge Summary (Signed)
Spoke with Dr Launa Flight about Marie Clarke not having any beds for her. Since pt is hemodynamically stable Dr Onalee Hua see her July 1st at 8:30 am

## 2014-08-11 NOTE — Care Management (Signed)
Important Message  Patient Details  Name: Marie Clarke MRN: [id number] Date of Birth: October 01, 1970   Medicare Important Message Given:  Other (see comment) (Patient being tranferred to another hospital- IM is NA)    Katrina Stack, RN 08/11/2014, 8:40 AM

## 2014-08-11 NOTE — Discharge Instructions (Signed)
F/u Dr Launa Flight on Friday July 1st

## 2014-08-11 NOTE — Progress Notes (Signed)
MD consulted with surgeon from [facility] about follow up out patient. Pt to be seen on Friday and discharged to home today. IV and tele removed, discharge instructions given with verbal acknowledgment of understanding and no questions. Pt escorted off unit via wheelchair by nursing.

## 2014-08-13 DIAGNOSIS — E119 Type 2 diabetes mellitus without complications: Secondary | ICD-10-CM | POA: Insufficient documentation

## 2014-08-13 DIAGNOSIS — N3281 Overactive bladder: Secondary | ICD-10-CM | POA: Insufficient documentation

## 2014-08-13 DIAGNOSIS — F319 Bipolar disorder, unspecified: Secondary | ICD-10-CM | POA: Insufficient documentation

## 2014-08-13 DIAGNOSIS — E785 Hyperlipidemia, unspecified: Secondary | ICD-10-CM | POA: Insufficient documentation

## 2014-08-13 DIAGNOSIS — E1169 Type 2 diabetes mellitus with other specified complication: Secondary | ICD-10-CM | POA: Insufficient documentation

## 2014-08-13 HISTORY — PX: OTHER SURGICAL HISTORY: SHX169

## 2014-09-10 ENCOUNTER — Ambulatory Visit (INDEPENDENT_AMBULATORY_CARE_PROVIDER_SITE_OTHER): Payer: Medicare Other | Admitting: Psychiatry

## 2014-09-10 ENCOUNTER — Encounter: Payer: Self-pay | Admitting: Psychiatry

## 2014-09-10 VITALS — BP 112/68 | HR 120 | Temp 98.4°F | Ht 64.0 in | Wt 235.8 lb

## 2014-09-10 DIAGNOSIS — F3170 Bipolar disorder, currently in remission, most recent episode unspecified: Secondary | ICD-10-CM

## 2014-09-10 MED ORDER — ARIPIPRAZOLE 15 MG PO TABS: 15.0000 mg | ORAL_TABLET | ORAL | Status: DC
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
## 2014-09-14 ENCOUNTER — Telehealth: Payer: Self-pay | Admitting: Psychiatry

## 2014-09-14 MED ORDER — LAMOTRIGINE 25 MG PO TABS
ORAL_TABLET | ORAL | Status: DC
Start: 2014-09-14 — End: 2014-11-30

## 2014-09-14 MED ORDER — LAMOTRIGINE 25 MG PO TABS: 25.0000 mg | ORAL_TABLET | Freq: Two times a day (BID) | ORAL | Status: DC

## 2014-09-14 NOTE — Telephone Encounter (Signed)
Patient contacted the clinic stating she is having swelling in her ankles. She also states that within an hour after taking the Abilify she would develop a fever and then it would go away. She is interest in other medication. She has not taken Abilify today and does not have any fever at this time. I have discussed the risk and benefits of Lamictal and patient is able to consent. I sent a prescription in to her pharmacy. AW

## 2014-09-16 ENCOUNTER — Telehealth: Payer: Self-pay | Admitting: Psychiatry

## 2014-09-16 DIAGNOSIS — M791 Myalgia, unspecified site: Secondary | ICD-10-CM

## 2014-09-16 HISTORY — DX: Myalgia, unspecified site: M79.10

## 2014-09-16 NOTE — Telephone Encounter (Signed)
Patient indicates she was seen at pain management and they made a suggestion for her to take Cymbalta. I told patient that might be a reasonable option she states she does have some chronic pain issues. However I did discuss that her main complaint at her last appointment was mood swings and she was more interested and mood stabilizing patient. I stated that we currently have a plan for her to take Lamictal and to have follow-up with me to assess how she does on that medication. I explained that my preference is not to start 2 medications in close proximity. Patient was in agreement with this and stated that she really did not want to take Cymbalta. I stated this might be a reasonable thing for [place] to do but we can decide at her upcoming appointment which is already schedules for 10/14/2014. Patient was agreeable with this plan. AW

## 2014-09-20 DIAGNOSIS — L02211 Cutaneous abscess of abdominal wall: Secondary | ICD-10-CM | POA: Insufficient documentation

## 2014-10-14 ENCOUNTER — Other Ambulatory Visit: Payer: Self-pay | Admitting: Psychiatry

## 2014-10-14 ENCOUNTER — Ambulatory Visit (INDEPENDENT_AMBULATORY_CARE_PROVIDER_SITE_OTHER): Payer: Medicare Other | Admitting: Psychiatry

## 2014-10-14 ENCOUNTER — Encounter: Payer: Self-pay | Admitting: Psychiatry

## 2014-10-14 VITALS — BP 138/82 | HR 96 | Temp 97.9°F | Ht 64.0 in | Wt 224.2 lb

## 2014-10-14 DIAGNOSIS — F4310 Post-traumatic stress disorder, unspecified: Secondary | ICD-10-CM

## 2014-10-14 DIAGNOSIS — F331 Major depressive disorder, recurrent, moderate: Secondary | ICD-10-CM

## 2014-10-14 MED ORDER — ESCITALOPRAM OXALATE 10 MG PO TABS: ORAL_TABLET | ORAL | Status: DC

## 2014-10-14 MED ORDER — HYDROXYZINE PAMOATE 25 MG PO CAPS: 25.0000 mg | ORAL_CAPSULE | Freq: Three times a day (TID) | ORAL | Status: DC | PRN

## 2014-10-14 NOTE — Progress Notes (Signed)
BH MD/PA/NP OP Progress Note  10/14/2014 9:05 AM Marie Clarke  MRN:  [id number]  Subjective:  She returns for follow-up for major depressive disorder, with mixed features. She discontinued the Abilify per our telephone conversation earlier this month. She related she was getting fevers when she took that medicine. At that time we started Lamictal which she states is helping stabilize her mood. However she presents to this appointment stating she is having flashbacks about dead relatives and some sadness. She denies any suicidal ideation. She also requests a referral to an adult day program which I thought was a good idea. She declines engaging in individual therapy in this clinic.   Chief Complaint:  Chief Complaint    Follow-up; Medication Refill; Depression     Visit Diagnosis:  No diagnosis found.  Past Medical History:  Past Medical History  Diagnosis Date  . CAD (coronary artery disease) unk  . Diabetes mellitus without complication   . Hypertension   . Depression unk  . Pancreatitis unk  . Thyroid disease   . TIA (transient ischemic attack) unk  . Anxiety   . COPD (chronic obstructive pulmonary disease)   . Asthma   . Headache   . Diabetes mellitus, type II     Past Surgical History  Procedure Laterality Date  . Prolapse rectum surgery N/A   . Abdominal hysterectomy     Family History:  Family History  Problem Relation Age of Onset  . Diabetes Mellitus II Mother   . CAD Mother   . Sleep apnea Mother   . Osteoarthritis Mother   . Osteoporosis Mother   . Anxiety disorder Mother   . Depression Mother   . Bipolar disorder Mother   . Bipolar disorder Father   . Hypertension Father   . Depression Father   . Anxiety disorder Father   . Post-traumatic stress disorder Sister    Social History:  Social History   Social History  . Marital Status: Single    Spouse Name: N/A  . Number of Children: N/A  . Years of Education: N/A   Social History Main Topics  .  Smoking status: Former Smoker    Types: Cigarettes    Quit date: 09/09/2005  . Smokeless tobacco: Never Used  . Alcohol Use: No  . Drug Use: No  . Sexual Activity: Not Currently   Other Topics Concern  . None   Social History Narrative   Additional History:   Assessment:   Musculoskeletal: Strength & Muscle Tone: within normal limits Gait & Station: Slow and ambulates with a cane Patient leans: N/A  Psychiatric Specialty Exam: HPI  Review of Systems  Psychiatric/Behavioral: Positive for depression (Later thinking about her deceased relatives). Negative for suicidal ideas, hallucinations, memory loss and substance abuse. The patient is nervous/anxious. The patient does not have insomnia.     Blood pressure 138/82, pulse 96, temperature 97.9 F (36.6 C), temperature source Tympanic, height '5\' 4"'  (1.626 m), weight 224 lb 3.2 oz (101.696 kg), SpO2 92 %.Body mass index is 38.46 kg/(m^2).  General Appearance: Well Groomed  Eye Contact:  Good  Speech:  Normal Rate  Volume:  Normal  Mood:  Okay  Affect:  initially tearful when talking about her flashbacks but then became somewhat bright and happy with the plan to address her issues with an antidepressant and referral to adult day  Thought Process:  Linear  Orientation:  Full (Time, Place, and Person)  Thought Content:  Negative  Suicidal Thoughts:  No  Homicidal Thoughts:  No  Memory:  Immediate;   Good Recent;   Good Remote;   Good  Judgement:  Good  Insight:  Fair  Psychomotor Activity:  Negative  Concentration:  Good  Recall:  Good  Fund of Knowledge: Good  Language: Good  Akathisia:  Negative  Handed:  Right unknown   AIMS (if indicated):  N/A  Assets:  Desire for Improvement Social Support  ADL's:  Intact  Cognition: WNL  Sleep:  good   Is the patient at risk to self?  No. Has the patient been a risk to self in the past 6 months?  No. Has the patient been a risk to self within the distant past?  Yes.   Is  the patient a risk to others?  No. Has the patient been a risk to others in the past 6 months?  No. Has the patient been a risk to others within the distant past?  No.  Current Medications: Current Outpatient Prescriptions  Medication Sig Dispense Refill  . ACCU-CHEK AVIVA PLUS test strip TEST 4 TIMES  DAILY USE AS DIRECTED  1  . albuterol (PROVENTIL HFA;VENTOLIN HFA) 108 (90 BASE) MCG/ACT inhaler Inhale 2 puffs into the lungs every 4 (four) hours as needed for wheezing or shortness of breath.    . ARIPiprazole (ABILIFY) 15 MG tablet Take 1 tablet (15 mg total) by mouth every morning. 30 tablet 1  . aspirin EC 81 MG tablet Take by mouth.    Marland Kitchen atorvastatin (LIPITOR) 40 MG tablet Take 40 mg by mouth at bedtime.     Marland Kitchen azelastine (ASTELIN) 0.1 % nasal spray Place into the nose.    . Blood Glucose Monitoring Suppl (ACCU-CHEK AVIVA PLUS) W/DEVICE KIT USE AS DIRECTED DAILY BY THE PRESCRIBER  0  . cyclobenzaprine (FLEXERIL) 5 MG tablet take 1 tablet by mouth three times a day if needed for muscle spasm  0  . ferrous sulfate 325 (65 FE) MG tablet Take by mouth.    . fluticasone-salmeterol (ADVAIR HFA) 230-21 MCG/ACT inhaler Inhale 2 puffs into the lungs 2 (two) times daily.    Marland Kitchen glipiZIDE (GLUCOTROL XL) 10 MG 24 hr tablet Take 10 mg by mouth daily as needed (for sugar greater than or equal to 200.).     Marland Kitchen ibuprofen (ADVIL,MOTRIN) 800 MG tablet take 1 tablet by mouth every 8 hours with food if needed for pain  0  . lamoTRIgine (LAMICTAL) 25 MG tablet Take 1 tablet a day for 7 days and then increase to 2 tablets daily. 60 tablet 0  . levocetirizine (XYZAL) 5 MG tablet Take 5 mg by mouth at bedtime.     Marland Kitchen levothyroxine (SYNTHROID, LEVOTHROID) 200 MCG tablet Take 200 mcg by mouth daily.     . metoprolol (LOPRESSOR) 50 MG tablet Take 25 mg by mouth 2 (two) times daily.    . metoprolol succinate (TOPROL-XL) 25 MG 24 hr tablet Take 12.5 mg by mouth daily.  1  . montelukast (SINGULAIR) 10 MG tablet Take 10 mg  by mouth daily.     . Olopatadine HCl 0.2 % SOLN Apply 1 drop to eye daily as needed (for dry eyes).     . pantoprazole (PROTONIX) 40 MG tablet   0  . pregabalin (LYRICA) 100 MG capsule Take 100 mg by mouth 3 (three) times daily.    . promethazine (PHENERGAN) 25 MG tablet take 1 tablet by mouth every 6 hours if needed for nausea  0  . RA  COL-RITE 100 MG capsule Take 100 mg by mouth 2 (two) times daily.  0  . ranitidine (ZANTAC) 300 MG tablet Take 300 mg by mouth daily.     . ranolazine (RANEXA) 500 MG 12 hr tablet Take 500 mg by mouth 2 (two) times daily.    Marland Kitchen SPIRIVA HANDIHALER 18 MCG inhalation capsule     . topiramate (TOPAMAX) 100 MG tablet Take 100 mg by mouth 3 (three) times daily.    Marland Kitchen torsemide (DEMADEX) 20 MG tablet Take 20 mg by mouth daily.    Marland Kitchen venlafaxine XR (EFFEXOR-XR) 150 MG 24 hr capsule Take 150 mg by mouth every morning.     . venlafaxine XR (EFFEXOR-XR) 37.5 MG 24 hr capsule Take 37.5 mg by mouth daily at 12 noon.     . VESICARE 10 MG tablet Take 10 mg by mouth daily.  1  . escitalopram (LEXAPRO) 10 MG tablet Take one half a tablet in the morning for 7 days then increase to one whole tablet in the morning. 30 tablet 1  . hydrOXYzine (VISTARIL) 25 MG capsule Take 1 capsule (25 mg total) by mouth 3 (three) times daily as needed for anxiety. 90 capsule 1   No current facility-administered medications for this visit.    Medical Decision Making:  Established Problem, Stable/Improving (1), Review of Medication Regimen & Side Effects (2) and Review of New Medication or Change in Dosage (2)  Treatment Plan Summary:Medication management and Plan We'll continue the patient on her Lamictal 50 mg daily. We will start some Lexapro. She is instructed to take 5 mg in the morning for 7 days and then increase to 10 mg. Risk and benefits of been discussing patient's able to consent. We'll start some Vistaril 25 mg 3 times a day as needed for anxiety. Risk and benefits discussed in patient's  able to consent. She's been on this medication, Vistaril before and reports she's had good results. She'll follow up in 1 month. She's been encouraged call any questions or concerns prior to her next appointment.   Faith Rogue 10/14/2014, 9:05 AM

## 2014-10-24 ENCOUNTER — Emergency Department
Admission: EM | Admit: 2014-10-24 | Discharge: 2014-10-24 | Disposition: A | Discharge status: Home / Self Care | Payer: Medicare Other | Attending: Emergency Medicine | Admitting: Emergency Medicine

## 2014-10-24 DIAGNOSIS — T8131XA Disruption of external operation (surgical) wound, not elsewhere classified, initial encounter: Secondary | ICD-10-CM

## 2014-10-24 DIAGNOSIS — Z87891 Personal history of nicotine dependence: Secondary | ICD-10-CM | POA: Diagnosis not present

## 2014-10-24 DIAGNOSIS — E119 Type 2 diabetes mellitus without complications: Secondary | ICD-10-CM | POA: Diagnosis not present

## 2014-10-24 DIAGNOSIS — K9189 Other postprocedural complications and disorders of digestive system: Secondary | ICD-10-CM | POA: Diagnosis present

## 2014-10-24 DIAGNOSIS — Z7982 Long term (current) use of aspirin: Secondary | ICD-10-CM | POA: Insufficient documentation

## 2014-10-24 DIAGNOSIS — Y838 Other surgical procedures as the cause of abnormal reaction of the patient, or of later complication, without mention of misadventure at the time of the procedure: Secondary | ICD-10-CM | POA: Diagnosis not present

## 2014-10-24 DIAGNOSIS — N183 Chronic kidney disease, stage 3 (moderate): Secondary | ICD-10-CM | POA: Diagnosis not present

## 2014-10-24 DIAGNOSIS — I129 Hypertensive chronic kidney disease with stage 1 through stage 4 chronic kidney disease, or unspecified chronic kidney disease: Secondary | ICD-10-CM | POA: Diagnosis not present

## 2014-10-24 DIAGNOSIS — Z79899 Other long term (current) drug therapy: Secondary | ICD-10-CM | POA: Diagnosis not present

## 2014-10-24 DIAGNOSIS — Z7951 Long term (current) use of inhaled steroids: Secondary | ICD-10-CM | POA: Diagnosis not present

## 2014-10-24 LAB — URINALYSIS COMPLETE WITH MICROSCOPIC (ARMC ONLY)
BILIRUBIN URINE: NEGATIVE
Bacteria, UA: NONE SEEN
Glucose, UA: NEGATIVE mg/dL
Hgb urine dipstick: NEGATIVE
KETONES UR: NEGATIVE mg/dL
Leukocytes, UA: NEGATIVE
Nitrite: NEGATIVE
PROTEIN: NEGATIVE mg/dL
Specific Gravity, Urine: 1.02 (ref 1.005–1.030)
pH: 5 (ref 5.0–8.0)

## 2014-10-24 LAB — CBC
HCT: 31.9 % — ABNORMAL LOW (ref 35.0–47.0)
Hemoglobin: 10.4 g/dL — ABNORMAL LOW (ref 12.0–16.0)
MCH: 27.3 pg (ref 26.0–34.0)
MCHC: 32.6 g/dL (ref 32.0–36.0)
MCV: 83.7 fL (ref 80.0–100.0)
Platelets: 231 10*3/uL (ref 150–440)
RBC: 3.81 MIL/uL (ref 3.80–5.20)
RDW: 17.9 % — ABNORMAL HIGH (ref 11.5–14.5)
WBC: 7.3 10*3/uL (ref 3.6–11.0)

## 2014-10-24 LAB — COMPREHENSIVE METABOLIC PANEL
ALBUMIN: 3.9 g/dL (ref 3.5–5.0)
ALK PHOS: 81 U/L (ref 38–126)
ALT: 12 U/L — AB (ref 14–54)
AST: 14 U/L — AB (ref 15–41)
Anion gap: 8 (ref 5–15)
BUN: 25 mg/dL — AB (ref 6–20)
CALCIUM: 8.9 mg/dL (ref 8.9–10.3)
CO2: 25 mmol/L (ref 22–32)
Chloride: 107 mmol/L (ref 101–111)
Creatinine, Ser: 1.1 mg/dL — ABNORMAL HIGH (ref 0.44–1.00)
GFR calc Af Amer: >60 mL/min (ref >60)
GFR calc non Af Amer: >60 mL/min (ref >60)
Glucose, Bld: 131 mg/dL — ABNORMAL HIGH (ref 65–99)
Potassium: 3.4 mmol/L — ABNORMAL LOW (ref 3.5–5.1)
SODIUM: 140 mmol/L (ref 135–145)
Total Bilirubin: 0.3 mg/dL (ref 0.3–1.2)
Total Protein: 7.4 g/dL (ref 6.5–8.1)

## 2014-10-24 MED ORDER — IBUPROFEN 800 MG PO TABS
800.0000 mg | ORAL_TABLET | Freq: Once | ORAL | Status: AC
  Administered 2014-10-24: 800 mg via ORAL

## 2014-10-24 MED ORDER — IBUPROFEN 400 MG PO TABS
ORAL_TABLET | ORAL | Status: AC
  Administered 2014-10-24: 800 mg via ORAL
  Filled 2014-10-24: qty 2

## 2014-10-24 NOTE — ED Provider Notes (Signed)
[facility] [place] Emergency Department Provider Note   ____________________________________________  Time seen: 10 PM I have reviewed the triage vital signs and the triage nursing note.  HISTORY  Chief Complaint Post-op Problem   Historian Patient  HPI Marie Clarke is a 44 y.o. female who lives with her aunt and uncle, who had a surgery in July for prolapsed bowel, complicated by need for JP drain placement in August. She states her midline vertical incision has been taking a long time to heal, however there is been no increased pain or drainage or fever. Today as she was changing her Band-Aid and the incision opened up and a blood clot came out. No additional pain in abdomen now. She does have a JP drain near the site which has been decreasing in output, and she has a follow-up plan with her surgeon on Friday.    Past Medical History  Diagnosis Date  . CAD (coronary artery disease) unk  . Diabetes mellitus without complication   . Hypertension   . Depression unk  . Pancreatitis unk  . Thyroid disease   . TIA (transient ischemic attack) unk  . Anxiety   . COPD (chronic obstructive pulmonary disease)   . Asthma   . Headache   . Diabetes mellitus, type II     Patient Active Problem List   Diagnosis Date Noted  . Abdominal wall abscess 09/20/2014  . Muscle ache 09/16/2014  . 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 Diabetes mellitus, type 2 08/13/2014  . Rectal prolapse 08/09/2014  . Rectal bleeding 08/09/2014  . Rectal bleed 08/09/2014  . 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 Arteriosclerosis of coronary artery 08/05/2014  . CCF (congestive cardiac failure) 08/05/2014  . Chronic kidney disease 08/05/2014  . 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 Apnea, sleep 08/05/2014  . Temporary cerebral vascular dysfunction 08/05/2014  . Chronic LBP 04/29/2014  . Polypharmacy 04/29/2014  . Left leg pain 04/29/2014  . 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 Chronic  kidney disease, stage III (moderate) 12/14/2013  . Controlled diabetes mellitus type II without complication 97/41/6384  . Essential (primary) hypertension 12/03/2013  . Adult hypothyroidism 12/03/2013    Past Surgical History  Procedure Laterality Date  . Prolapse rectum surgery N/A   . Abdominal hysterectomy      Current Outpatient Rx  Name  Route  Sig  Dispense  Refill  . ACCU-CHEK AVIVA PLUS test strip      TEST 4 TIMES  DAILY USE AS DIRECTED      1     Dispense as written.   Marland Kitchen albuterol (PROVENTIL HFA;VENTOLIN HFA) 108 (90 BASE) MCG/ACT inhaler   Inhalation   Inhale 2 puffs into the lungs every 4 (four) hours as needed for wheezing or shortness of breath.         . ARIPiprazole (ABILIFY) 15 MG tablet   Oral   Take 1 tablet (15 mg total) by mouth every morning.   30 tablet   1   . aspirin EC 81 MG tablet   Oral   Take by mouth.         Marland Kitchen atorvastatin (LIPITOR) 40 MG tablet   Oral   Take 40 mg by mouth at bedtime.          Marland Kitchen azelastine (ASTELIN) 0.1 % nasal spray   Nasal   Place into the nose.         . Blood Glucose Monitoring Suppl (ACCU-CHEK AVIVA PLUS) W/DEVICE KIT  USE AS DIRECTED DAILY BY THE PRESCRIBER      0   . cyclobenzaprine (FLEXERIL) 5 MG tablet      take 1 tablet by mouth three times a day if needed for muscle spasm      0   . escitalopram (LEXAPRO) 10 MG tablet      Take one half a tablet in the morning for 7 days then increase to one whole tablet in the morning.   30 tablet   1   . ferrous sulfate 325 (65 FE) MG tablet   Oral   Take by mouth.         . fluticasone-salmeterol (ADVAIR HFA) 230-21 MCG/ACT inhaler   Inhalation   Inhale 2 puffs into the lungs 2 (two) times daily.         Marland Kitchen glipiZIDE (GLUCOTROL XL) 10 MG 24 hr tablet   Oral   Take 10 mg by mouth daily as needed (for sugar greater than or equal to 200.).          Marland Kitchen hydrOXYzine (VISTARIL) 25 MG capsule   Oral   Take 1 capsule (25 mg total) by  mouth 3 (three) times daily as needed for anxiety.   90 capsule   1   . ibuprofen (ADVIL,MOTRIN) 800 MG tablet      take 1 tablet by mouth every 8 hours with food if needed for pain      0   . lamoTRIgine (LAMICTAL) 25 MG tablet      Take 1 tablet a day for 7 days and then increase to 2 tablets daily.   60 tablet   0   . lamoTRIgine (LAMICTAL) 25 MG tablet   Oral   Take 2 tablets (50 mg total) by mouth daily.   60 tablet   1   . levocetirizine (XYZAL) 5 MG tablet   Oral   Take 5 mg by mouth at bedtime.          Marland Kitchen levothyroxine (SYNTHROID, LEVOTHROID) 200 MCG tablet   Oral   Take 200 mcg by mouth daily.          . metoprolol (LOPRESSOR) 50 MG tablet   Oral   Take 25 mg by mouth 2 (two) times daily.         . metoprolol succinate (TOPROL-XL) 25 MG 24 hr tablet   Oral   Take 12.5 mg by mouth daily.      1   . montelukast (SINGULAIR) 10 MG tablet   Oral   Take 10 mg by mouth daily.          . Olopatadine HCl 0.2 % SOLN   Ophthalmic   Apply 1 drop to eye daily as needed (for dry eyes).          . pantoprazole (PROTONIX) 40 MG tablet            0   . pregabalin (LYRICA) 100 MG capsule   Oral   Take 100 mg by mouth 3 (three) times daily.         . promethazine (PHENERGAN) 25 MG tablet      take 1 tablet by mouth every 6 hours if needed for nausea      0   . RA COL-RITE 100 MG capsule   Oral   Take 100 mg by mouth 2 (two) times daily.      0     Dispense as written.   . ranitidine (ZANTAC) 300  MG tablet   Oral   Take 300 mg by mouth daily.          . ranolazine (RANEXA) 500 MG 12 hr tablet   Oral   Take 500 mg by mouth 2 (two) times daily.         Marland Kitchen SPIRIVA HANDIHALER 18 MCG inhalation capsule                 Dispense as written.   . topiramate (TOPAMAX) 100 MG tablet   Oral   Take 100 mg by mouth 3 (three) times daily.         Marland Kitchen torsemide (DEMADEX) 20 MG tablet   Oral   Take 20 mg by mouth daily.         Marland Kitchen  venlafaxine XR (EFFEXOR-XR) 150 MG 24 hr capsule   Oral   Take 150 mg by mouth every morning.          . venlafaxine XR (EFFEXOR-XR) 37.5 MG 24 hr capsule   Oral   Take 37.5 mg by mouth daily at 12 noon.          . VESICARE 10 MG tablet   Oral   Take 10 mg by mouth daily.      1     Dispense as written.     Allergies Sulfa antibiotics; Azithromycin; Cephalexin; Ciprofloxacin; Flagyl; Levofloxacin; and Vancomycin  Family History  Problem Relation Age of Onset  . Diabetes Mellitus II Mother   . CAD Mother   . Sleep apnea Mother   . Osteoarthritis Mother   . Osteoporosis Mother   . Anxiety disorder Mother   . Depression Mother   . Bipolar disorder Mother   . Bipolar disorder Father   . Hypertension Father   . Depression Father   . Anxiety disorder Father   . Post-traumatic stress disorder Sister     Social History Social History  Substance Use Topics  . Smoking status: Former Smoker    Types: Cigarettes    Quit date: 09/09/2005  . Smokeless tobacco: Never Used  . Alcohol Use: No    Review of Systems  Constitutional: Negative for fever. Eyes: Negative for visual changes. ENT: Negative for sore throat. Cardiovascular: Negative for chest pain. Respiratory: Negative for shortness of breath. Gastrointestinal: Negative for abdominal pain, vomiting and diarrhea. Genitourinary: Negative for dysuria. Musculoskeletal: Negative for back pain. Skin: Negative for rash. Neurological: Negative for headache. 10 point Review of Systems otherwise negative ____________________________________________   PHYSICAL EXAM:  VITAL SIGNS: ED Triage Vitals  Enc Vitals Group     BP 10/24/14 1818 119/64 mmHg     Pulse Rate 10/24/14 1818 100     Resp 10/24/14 1818 16     Temp 10/24/14 1818 98 F (36.7 C)     Temp Source 10/24/14 1818 Oral     SpO2 10/24/14 1818 97 %     Weight 10/24/14 1818 225 lb (102.059 kg)     Height 10/24/14 1818 $RemoveBefor'5\' 4"'[id number]$  (1.626 m)     Head Cir --       Peak Flow --      Pain Score 10/24/14 1821 9     Pain Loc --      Pain Edu? --      Excl. in [place]? --      Constitutional: Alert and oriented. Well appearing and in no distress. Eyes: Conjunctivae are normal. PERRL. Normal extraocular movements. ENT   Head: Normocephalic and atraumatic.   Nose:  No congestion/rhinnorhea.   Mouth/Throat: Mucous membranes are moist.   Neck: No stridor. Cardiovascular/Chest: Normal rate, regular rhythm.  No murmurs, rubs, or gallops. Respiratory: Normal respiratory effort without tachypnea nor retractions. Breath sounds are clear and equal bilaterally. No wheezes/rales/rhonchi. Gastrointestinal: Soft. No distention, no guarding, no rebound. Nontender. JP drain at midline right abdomen with serosanguineous fluid in the drain. Midline vertical incision below the belly button is open and superficially deep without any evidence of fistula or communication with the underlying structures.  Genitourinary/rectal:Deferred Musculoskeletal: Nontender with normal range of motion in all extremities. No joint effusions.  No lower extremity tenderness.  No edema. Neurologic:  Normal speech and language. No gross or focal neurologic deficits are appreciated. Skin:  Skin is warm, dry and intact. No rash noted. Psychiatric: Mood and affect are normal. Speech and behavior are normal. Patient exhibits appropriate insight and judgment.  ____________________________________________   EKG I, Lisa Roca, MD, the attending physician have personally viewed and interpreted all ECGs.  No EKG performed ____________________________________________  LABS (pertinent positives/negatives)  White blood count 7.3, hemoglobin 10.4, platelet count 550 Complete metabolic panel significant for BUN 25 cranny 1.10 without other significant abnormality Urinalysis negative  ____________________________________________  RADIOLOGY All Xrays were viewed by me. Imaging  interpreted by Radiologist.  None __________________________________________  PROCEDURES  Procedure(s) performed: None  Critical Care performed: None  ____________________________________________   ED COURSE / ASSESSMENT AND PLAN  CONSULTATIONS: None  Pertinent labs & imaging results that were available during my care of the patient were reviewed by me and considered in my medical decision making (see chart for details).   The area of concern is a small wound dehiscence without any deep communication or concern for fistula. It sounds like there may have been a small seroma that escaped. At this point time healing by secondary intention with Vaseline I think is the best solution. She has follow up on Friday with her surgeon. [facility] also [facility] which is not a bad idea given that she's had a significantly prolonged course in wound healing already.  Patient / Family / Caregiver informed of clinical course, medical decision-making process, and agree with plan.   I discussed return precautions, follow-up instructions, and discharged instructions with patient and/or family.  ___________________________________________   FINAL CLINICAL IMPRESSION(S) / ED DIAGNOSES   Final diagnoses:  Wound dehiscence, initial encounter       Lisa Roca, MD 10/24/14 2243

## 2014-10-24 NOTE — Discharge Instructions (Signed)
You were evaluated after your abdominal wound opened up, however it does appear to be extremely superficial, and it will heal up in this condition. Use over-the-counter neomycin or Vaseline to keep the area moist. Follow up with your surgeon on Friday as scheduled, and I also included the number for the [place] [facility] for close following of this wound.  Return to the emergency department for any worsening condition including redness, fever, drainage from the wound, abdominal pain, or any other symptoms concerning to you.

## 2014-10-24 NOTE — ED Notes (Addendum)
Patient to ER for c/o post-op complications. Patient reports having "prolapsed bowel surgery" in July at [facility]. States she has been being followed and was down to having bandaid on incision with JP drain. Patient states today incision burst open, large blood clot from incision. Patient called [facility], was told to go to nearest hospital. Denies any intestines protruding from incision, but reports having large amount of blood at time of incident.

## 2014-10-24 NOTE — ED Notes (Signed)
CBC reviewed, awaiting rest of lab results. Also awaiting room for MD eval.

## 2014-11-12 ENCOUNTER — Telehealth: Payer: Self-pay

## 2014-11-12 NOTE — Telephone Encounter (Signed)
recieved fax that prior authoriation approved from 02-10-14 to 02-12-15 for hydroxyzine pamoate referral # [id number]

## 2014-11-15 ENCOUNTER — Ambulatory Visit (INDEPENDENT_AMBULATORY_CARE_PROVIDER_SITE_OTHER): Payer: Medicare Other | Admitting: Psychiatry

## 2014-11-15 ENCOUNTER — Encounter: Payer: Self-pay | Admitting: Psychiatry

## 2014-11-15 VITALS — BP 124/64 | HR 110 | Temp 97.8°F | Ht 64.0 in | Wt 228.0 lb

## 2014-11-15 DIAGNOSIS — F4310 Post-traumatic stress disorder, unspecified: Secondary | ICD-10-CM | POA: Diagnosis not present

## 2014-11-15 DIAGNOSIS — F331 Major depressive disorder, recurrent, moderate: Secondary | ICD-10-CM | POA: Diagnosis not present

## 2014-11-15 DIAGNOSIS — I509 Heart failure, unspecified: Secondary | ICD-10-CM | POA: Insufficient documentation

## 2014-11-15 MED ORDER — ESCITALOPRAM OXALATE 20 MG PO TABS: 20.0000 mg | ORAL_TABLET | ORAL | Status: DC

## 2014-11-15 NOTE — Progress Notes (Signed)
BH MD/PA/NP OP Progress Note  11/15/2014 2:32 PM Marie Clarke  MRN:  [id number]  Subjective:  She returns for follow-up for major depressive disorder, with mixed features and PTSD. She indicates that the Lexapro has helped her mood stay more even. She also states the Lamictal has done well in terms of keeping her mood stable. She relates her biggest issue right now continues to be flashbacks of her dead relatives. She states this is not resolved.  She indicates her appetite is good. She states she engages in activities such as coloring or going for a walk. She did make an attempt to get into an adult day program but's she states due to funding they are not able to accept her. She states she is on a waiting list for that. I did discuss with her she wondered engage in therapy and she stated she does not want to do that. He is sleeping well. She denies any side effects from medications. She denies any rash from her Lamictal. She indicates she does not use the Vistaril regularly and only uses it when she has severe anxiety.  Chief Complaint: Flashbacks Chief Complaint    Follow-up; Medication Refill; Other; Fatigue     Visit Diagnosis:     ICD-9-CM ICD-10-CM   1. PTSD (post-traumatic stress disorder) 309.81 F43.10   2. Major depressive disorder, recurrent episode, moderate (HCC) 296.32 F33.1     Past Medical History:  Past Medical History  Diagnosis Date  . CAD (coronary artery disease) unk  . Diabetes mellitus without complication (HCC)   . Hypertension   . Depression unk  . Pancreatitis unk  . Thyroid disease   . TIA (transient ischemic attack) unk  . Anxiety   . COPD (chronic obstructive pulmonary disease) (HCC)   . Asthma   . Headache   . Diabetes mellitus, type II [facility])     Past Surgical History  Procedure Laterality Date  . Prolapse rectum surgery N/A   . Abdominal hysterectomy     Family History:  Family History  Problem Relation Age of Onset  . Diabetes Mellitus II Mother    . CAD Mother   . Sleep apnea Mother   . Osteoarthritis Mother   . Osteoporosis Mother   . Anxiety disorder Mother   . Depression Mother   . Bipolar disorder Mother   . Bipolar disorder Father   . Hypertension Father   . Depression Father   . Anxiety disorder Father   . Post-traumatic stress disorder Sister    Social History:  Social History   Social History  . Marital Status: Single    Spouse Name: N/A  . Number of Children: N/A  . Years of Education: N/A   Social History Main Topics  . Smoking status: Former Smoker    Types: Cigarettes    Quit date: 09/09/2005  . Smokeless tobacco: Never Used  . Alcohol Use: No  . Drug Use: No  . Sexual Activity: Not Currently   Other Topics Concern  . None   Social History Narrative   Additional History:   Assessment:   Musculoskeletal: Strength & Muscle Tone: within normal limits Gait & Station: Slow and ambulates with a cane Patient leans: N/A  Psychiatric Specialty Exam: HPI  Review of Systems  Psychiatric/Behavioral: Negative for depression (Later thinking about her deceased relatives), suicidal ideas, hallucinations, memory loss and substance abuse. The patient is not nervous/anxious and does not have insomnia.   All other systems reviewed and are negative.  Blood pressure 124/64, pulse 110, temperature 97.8 F (36.6 C), temperature source Tympanic, height $RemoveBeforeDE'5\' 4"'[id number]$  (1.626 m), weight 228 lb (103.42 kg), SpO2 96 %.Body mass index is 39.12 kg/(m^2).  General Appearance: Well Groomed  Eye Contact:  Good  Speech:  Normal Rate  Volume:  Normal  Mood:  Good  Affect:  Brighter than at the last visit  Thought Process:  Linear  Orientation:  Full (Time, Place, and Person)  Thought Content:  Negative  Suicidal Thoughts:  No  Homicidal Thoughts:  No  Memory:  Immediate;   Good Recent;   Good Remote;   Good  Judgement:  Good  Insight:  Fair  Psychomotor Activity:  Negative  Concentration:  Good  Recall:  Good  Fund of  Knowledge: Good  Language: Good  Akathisia:  Negative  Handed:  Right unknown   AIMS (if indicated):  N/A  Assets:  Desire for Improvement Social Support  ADL's:  Intact  Cognition: WNL  Sleep:  good   Is the patient at risk to self?  No. Has the patient been a risk to self in the past 6 months?  No. Has the patient been a risk to self within the distant past?  Yes.   Is the patient a risk to others?  No. Has the patient been a risk to others in the past 6 months?  No. Has the patient been a risk to others within the distant past?  No.  Current Medications: Current Outpatient Prescriptions  Medication Sig Dispense Refill  . ACCU-CHEK AVIVA PLUS test strip TEST 4 TIMES  DAILY USE AS DIRECTED  1  . albuterol (PROVENTIL HFA;VENTOLIN HFA) 108 (90 BASE) MCG/ACT inhaler Inhale 2 puffs into the lungs every 4 (four) hours as needed for wheezing or shortness of breath.    Marland Kitchen aspirin EC 81 MG tablet Take by mouth.    Marland Kitchen atorvastatin (LIPITOR) 40 MG tablet Take 40 mg by mouth at bedtime.     Marland Kitchen azelastine (ASTELIN) 0.1 % nasal spray Place into the nose.    . Blood Glucose Monitoring Suppl (ACCU-CHEK AVIVA PLUS) W/DEVICE KIT USE AS DIRECTED DAILY BY THE PRESCRIBER  0  . cyclobenzaprine (FLEXERIL) 5 MG tablet take 1 tablet by mouth three times a day if needed for muscle spasm  0  . escitalopram (LEXAPRO) 20 MG tablet Take 1 tablet (20 mg total) by mouth every morning. 30 tablet 1  . ferrous sulfate 325 (65 FE) MG tablet Take by mouth.    . fluticasone-salmeterol (ADVAIR HFA) 230-21 MCG/ACT inhaler Inhale 2 puffs into the lungs 2 (two) times daily.    Marland Kitchen glipiZIDE (GLUCOTROL XL) 10 MG 24 hr tablet Take 10 mg by mouth daily as needed (for sugar greater than or equal to 200.).     Marland Kitchen hydrOXYzine (VISTARIL) 25 MG capsule Take 1 capsule (25 mg total) by mouth 3 (three) times daily as needed for anxiety. 90 capsule 1  . ibuprofen (ADVIL,MOTRIN) 800 MG tablet take 1 tablet by mouth every 8 hours with food if  needed for pain  0  . lamoTRIgine (LAMICTAL) 25 MG tablet Take 1 tablet a day for 7 days and then increase to 2 tablets daily. 60 tablet 0  . lamoTRIgine (LAMICTAL) 25 MG tablet Take 2 tablets (50 mg total) by mouth daily. 60 tablet 1  . levocetirizine (XYZAL) 5 MG tablet Take 5 mg by mouth at bedtime.     Marland Kitchen levothyroxine (SYNTHROID, LEVOTHROID) 200 MCG tablet Take 200  mcg by mouth daily.     . metoprolol succinate (TOPROL-XL) 25 MG 24 hr tablet Take 12.5 mg by mouth daily.  1  . montelukast (SINGULAIR) 10 MG tablet Take 10 mg by mouth daily.     . Olopatadine HCl 0.2 % SOLN Apply 1 drop to eye daily as needed (for dry eyes).     . pantoprazole (PROTONIX) 40 MG tablet   0  . pregabalin (LYRICA) 100 MG capsule Take 100 mg by mouth 3 (three) times daily.    . promethazine (PHENERGAN) 25 MG tablet take 1 tablet by mouth every 6 hours if needed for nausea  0  . ranitidine (ZANTAC) 300 MG tablet Take 300 mg by mouth daily.     . ranolazine (RANEXA) 500 MG 12 hr tablet Take 500 mg by mouth 2 (two) times daily.    Marland Kitchen SPIRIVA HANDIHALER 18 MCG inhalation capsule     . topiramate (TOPAMAX) 100 MG tablet Take 100 mg by mouth 3 (three) times daily.    Marland Kitchen torsemide (DEMADEX) 20 MG tablet Take 20 mg by mouth daily.    . VESICARE 10 MG tablet Take 10 mg by mouth daily.  1   No current facility-administered medications for this visit.    Medical Decision Making:  Established Problem, Stable/Improving (1), Review of Medication Regimen & Side Effects (2) and Review of New Medication or Change in Dosage (2)  Treatment Plan Summary:Medication management and Plan   Major depressive disorder-We'll continue the patient on her Lamictal 50 mg daily and  Lexapro discussed below.    Posttraumatic stress disorder-continue Vistaril 25 mg 3 times a day as needed for anxiety. We will increase her Lexapro from 10 mg a day to 20 mg a day to target her flashbacks. If this does not improve we will consider prazosin at the  next visit. However given her multiple medications we would like to avoid adding to polypharmacy.   Faith Rogue 11/15/2014, 2:32 PM

## 2014-11-30 ENCOUNTER — Emergency Department: Payer: Medicare Other

## 2014-11-30 ENCOUNTER — Encounter: Payer: Self-pay | Admitting: *Deleted

## 2014-11-30 ENCOUNTER — Emergency Department
Admission: EM | Admit: 2014-11-30 | Discharge: 2014-11-30 | Disposition: A | Discharge status: Home / Self Care | Payer: Medicare Other | Attending: Emergency Medicine | Admitting: Emergency Medicine

## 2014-11-30 DIAGNOSIS — Z79899 Other long term (current) drug therapy: Secondary | ICD-10-CM | POA: Diagnosis not present

## 2014-11-30 DIAGNOSIS — Z7951 Long term (current) use of inhaled steroids: Secondary | ICD-10-CM | POA: Diagnosis not present

## 2014-11-30 DIAGNOSIS — Y783 Surgical instruments, materials and radiological devices (including sutures) associated with adverse incidents: Secondary | ICD-10-CM | POA: Diagnosis not present

## 2014-11-30 DIAGNOSIS — Z7982 Long term (current) use of aspirin: Secondary | ICD-10-CM | POA: Insufficient documentation

## 2014-11-30 DIAGNOSIS — Y838 Other surgical procedures as the cause of abnormal reaction of the patient, or of later complication, without mention of misadventure at the time of the procedure: Secondary | ICD-10-CM | POA: Insufficient documentation

## 2014-11-30 DIAGNOSIS — K2970 Gastritis, unspecified, without bleeding: Secondary | ICD-10-CM | POA: Insufficient documentation

## 2014-11-30 DIAGNOSIS — T8189XD Other complications of procedures, not elsewhere classified, subsequent encounter: Secondary | ICD-10-CM

## 2014-11-30 DIAGNOSIS — E119 Type 2 diabetes mellitus without complications: Secondary | ICD-10-CM | POA: Insufficient documentation

## 2014-11-30 DIAGNOSIS — Z791 Long term (current) use of non-steroidal anti-inflammatories (NSAID): Secondary | ICD-10-CM | POA: Insufficient documentation

## 2014-11-30 DIAGNOSIS — I10 Essential (primary) hypertension: Secondary | ICD-10-CM | POA: Diagnosis not present

## 2014-11-30 DIAGNOSIS — Z87891 Personal history of nicotine dependence: Secondary | ICD-10-CM | POA: Insufficient documentation

## 2014-11-30 MED ORDER — SUCRALFATE 1 G PO TABS: 1.0000 g | ORAL_TABLET | Freq: Four times a day (QID) | ORAL | Status: DC

## 2014-11-30 MED ORDER — RANITIDINE HCL 300 MG PO TABS: 300.0000 mg | ORAL_TABLET | Freq: Two times a day (BID) | ORAL | Status: DC

## 2014-11-30 MED ORDER — GI COCKTAIL ~~LOC~~
30.0000 mL | ORAL | Status: AC
Start: 2014-11-30 — End: 2014-11-30
  Administered 2014-11-30: 30 mL via ORAL
  Filled 2014-11-30: qty 30

## 2014-11-30 NOTE — Discharge Instructions (Signed)
Your surgical wound is healing well and is not infected. Wear loose fitting clothing and avoid any elastic bands applying pressure to it. Your symptoms otherwise are suggestive of gastritis and acid reflux. Increase your Zantac to twice daily. Follow up with your doctor in 2 days as scheduled.   Gastritis, Adult Gastritis is soreness and swelling (inflammation) of the lining of the stomach. Gastritis can develop as a sudden onset (acute) or long-term (chronic) condition. If gastritis is not treated, it can lead to stomach bleeding and ulcers. CAUSES  Gastritis occurs when the stomach lining is weak or damaged. Digestive juices from the stomach then inflame the weakened stomach lining. The stomach lining may be weak or damaged due to viral or bacterial infections. One common bacterial infection is the Helicobacter pylori infection. Gastritis can also result from excessive alcohol consumption, taking certain medicines, or having too much acid in the stomach.  SYMPTOMS  In some cases, there are no symptoms. When symptoms are present, they may include:  Pain or a burning sensation in the upper abdomen.  Nausea.  Vomiting.  An uncomfortable feeling of fullness after eating. DIAGNOSIS  Your caregiver may suspect you have gastritis based on your symptoms and a physical exam. To determine the cause of your gastritis, your caregiver may perform the following:  Blood or stool tests to check for the H pylori bacterium.  Gastroscopy. A thin, flexible tube (endoscope) is passed down the esophagus and into the stomach. The endoscope has a light and camera on the end. Your caregiver uses the endoscope to view the inside of the stomach.  Taking a tissue sample (biopsy) from the stomach to examine under a microscope. TREATMENT  Depending on the cause of your gastritis, medicines may be prescribed. If you have a bacterial infection, such as an H pylori infection, antibiotics may be given. If your gastritis is  caused by too much acid in the stomach, H2 blockers or antacids may be given. Your caregiver may recommend that you stop taking aspirin, ibuprofen, or other nonsteroidal anti-inflammatory drugs (NSAIDs). HOME CARE INSTRUCTIONS  Only take over-the-counter or prescription medicines as directed by your caregiver.  If you were given antibiotic medicines, take them as directed. Finish them even if you start to feel better.  Drink enough fluids to keep your urine clear or pale yellow.  Avoid foods and drinks that make your symptoms worse, such as:  Caffeine or alcoholic drinks.  Chocolate.  Peppermint or mint flavorings.  Garlic and onions.  Spicy foods.  Citrus fruits, such as oranges, lemons, or limes.  Tomato-based foods such as sauce, chili, salsa, and pizza.  Fried and fatty foods.  Eat small, frequent meals instead of large meals. SEEK IMMEDIATE MEDICAL CARE IF:   You have black or dark red stools.  You vomit blood or material that looks like coffee grounds.  You are unable to keep fluids down.  Your abdominal pain gets worse.  You have a fever.  You do not feel better after 1 week.  You have any other questions or concerns. MAKE SURE YOU:  Understand these instructions.  Will watch your condition.  Will get help right away if you are not doing well or get worse.   This information is not intended to replace advice given to you by your health care provider. Make sure you discuss any questions you have with your health care provider.   Document Released: 01/23/2001 Document Revised: 07/31/2011 Document Reviewed: 03/14/2011 Elsevier Interactive Patient Education 2016 Elsevier  Inc. ° °

## 2014-11-30 NOTE — ED Notes (Signed)
Incision site cleansed with normal saline; 2x2 gauze placed.

## 2014-11-30 NOTE — ED Provider Notes (Signed)
[facility] Emergency Department Provider Note  ____________________________________________  Time seen: 3:35 PM  I have reviewed the triage vital signs and the nursing notes.   HISTORY  Chief Complaint Post-op Problem    HPI Marie Clarke is a 44 y.o. female who complains of a bitter taste in her mouth when she wakes up in the morning or last couple of days. She also complains of upper abdominal pain. She takes Protonix and Zantac once daily. She has been compliant with these medications. She denies cough or sore throat, no fever chills.  She reports that she feels like she can't get air in the bottom of the left lung but overall does not have chest pain or shortness of breath. Just an odd sensation of a partial blockage in the lung as she describes it..She also complains that she is having redness and drainage from her ventral abdominal incision where she had a proctopexy done 3 months ago. This has been evaluated multiple times by her surgery team and primary care doctor who have all noted normal healing.     Past Medical History  Diagnosis Date  . CAD (coronary artery disease) unk  . Diabetes mellitus without complication ([place])   . Hypertension   . Depression unk  . Pancreatitis unk  . Thyroid disease   . TIA (transient ischemic attack) unk  . Anxiety   . COPD (chronic obstructive pulmonary disease) ([place])   . Asthma   . Headache   . Diabetes mellitus, type II [facility])      Patient Active Problem List   Diagnosis Date Noted  . Congestive heart failure ([place]) 11/15/2014  . Abdominal wall abscess 09/20/2014  . Muscle ache 09/16/2014  . 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 Diabetes mellitus, type 2 ([place]) 08/13/2014  . Bipolar affective disorder ([place]) 08/13/2014  . Type 2 diabetes mellitus ([place]) 08/13/2014  . Rectal prolapse 08/09/2014  . Rectal bleeding 08/09/2014  . Rectal bleed 08/09/2014  . Affective bipolar disorder ([place]) 08/05/2014  .  Arteriosclerosis of coronary artery 08/05/2014  . CCF (congestive cardiac failure) ([place]) 08/05/2014  . Chronic kidney disease 08/05/2014  . 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 Apnea, sleep 08/05/2014  . Temporary cerebral vascular dysfunction 08/05/2014  . Chronic LBP 04/29/2014  . Polypharmacy 04/29/2014  . Left leg pain 04/29/2014  . Other long term (current) drug therapy 04/29/2014  . 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 Chronic kidney disease, stage III (moderate) 12/14/2013  . Controlled diabetes mellitus type II without complication ([place]) 62/69/4854  . Essential (primary) hypertension 12/03/2013  . Adult hypothyroidism 12/03/2013  . Controlled type 2 diabetes mellitus without complication ([place]) 62/70/3500     Past Surgical History  Procedure Laterality Date  . Prolapse rectum surgery N/A   . Abdominal hysterectomy       Current Outpatient Rx  Name  Route  Sig  Dispense  Refill  . albuterol (PROVENTIL HFA;VENTOLIN HFA) 108 (90 BASE) MCG/ACT inhaler   Inhalation   Inhale 2 puffs into the lungs every 4 (four) hours as needed for wheezing or shortness of breath.         Marland Kitchen aspirin EC 81 MG tablet   Oral   Take 81 mg by mouth daily at 12 noon.         Marland Kitchen atorvastatin (LIPITOR) 40 MG tablet   Oral   Take 40 mg by mouth at bedtime.          Marland Kitchen azelastine (ASTELIN) 0.1 % nasal spray  Each Nare   Place 1 spray into both nostrils daily.         . cyclobenzaprine (FLEXERIL) 5 MG tablet   Oral   Take 5 mg by mouth 3 (three) times daily as needed for muscle spasms.         Marland Kitchen escitalopram (LEXAPRO) 20 MG tablet   Oral   Take 1 tablet (20 mg total) by mouth every morning.   30 tablet   1     Hold until patient calls for refill.   . ferrous sulfate 325 (65 FE) MG tablet   Oral   Take 325 mg by mouth daily.         . fluticasone-salmeterol (ADVAIR HFA) 230-21 MCG/ACT inhaler   Inhalation   Inhale 2 puffs into the lungs 2 (two) times daily.          Marland Kitchen glipiZIDE (GLUCOTROL XL) 10 MG 24 hr tablet   Oral   Take 10 mg by mouth daily as needed (for sugar greater than or equal to 200.).          Marland Kitchen hydrOXYzine (VISTARIL) 25 MG capsule   Oral   Take 1 capsule (25 mg total) by mouth 3 (three) times daily as needed for anxiety.   90 capsule   1   . ibuprofen (ADVIL,MOTRIN) 800 MG tablet   Oral   Take 800 mg by mouth 3 (three) times daily.         Marland Kitchen lamoTRIgine (LAMICTAL) 25 MG tablet   Oral   Take 2 tablets (50 mg total) by mouth daily. Patient taking differently: Take 25 mg by mouth 2 (two) times daily.    60 tablet   1   . levocetirizine (XYZAL) 5 MG tablet   Oral   Take 5 mg by mouth at bedtime.          Marland Kitchen levothyroxine (SYNTHROID, LEVOTHROID) 200 MCG tablet   Oral   Take 200 mcg by mouth daily.          . metoprolol succinate (TOPROL-XL) 25 MG 24 hr tablet   Oral   Take 12.5 mg by mouth at bedtime.         . montelukast (SINGULAIR) 10 MG tablet   Oral   Take 10 mg by mouth daily.          . Multiple Vitamin (MULTIVITAMIN WITH MINERALS) TABS tablet   Oral   Take 1 tablet by mouth daily.         . Olopatadine HCl (PATADAY) 0.2 % SOLN   Ophthalmic   Apply 1 drop to eye as needed (for dry eyes).         . pantoprazole (PROTONIX) 40 MG tablet   Oral   Take 40 mg by mouth 2 (two) times daily.         . pregabalin (LYRICA) 100 MG capsule   Oral   Take 100 mg by mouth 4 (four) times daily.          . ranolazine (RANEXA) 500 MG 12 hr tablet   Oral   Take 500 mg by mouth 2 (two) times daily.         . solifenacin (VESICARE) 10 MG tablet   Oral   Take 10 mg by mouth at bedtime.         Marland Kitchen tiotropium (SPIRIVA) 18 MCG inhalation capsule   Inhalation   Place 18 mcg into inhaler and inhale at bedtime.         Marland Kitchen  topiramate (TOPAMAX) 100 MG tablet   Oral   Take 100 mg by mouth 3 (three) times daily.         Marland Kitchen torsemide (DEMADEX) 20 MG tablet   Oral   Take 20 mg by mouth daily.          . ranitidine (ZANTAC) 300 MG tablet   Oral   Take 1 tablet (300 mg total) by mouth 2 (two) times daily.   60 tablet   0   . sucralfate (CARAFATE) 1 G tablet   Oral   Take 1 tablet (1 g total) by mouth 4 (four) times daily.   120 tablet   1      Allergies Sulfa antibiotics; Azithromycin; Cephalexin; Ciprofloxacin; Flagyl; Levofloxacin; and Vancomycin   Family History  Problem Relation Age of Onset  . Diabetes Mellitus II Mother   . CAD Mother   . Sleep apnea Mother   . Osteoarthritis Mother   . Osteoporosis Mother   . Anxiety disorder Mother   . Depression Mother   . Bipolar disorder Mother   . Bipolar disorder Father   . Hypertension Father   . Depression Father   . Anxiety disorder Father   . Post-traumatic stress disorder Sister     Social History Social History  Substance Use Topics  . Smoking status: Former Smoker    Types: Cigarettes    Quit date: 09/09/2005  . Smokeless tobacco: Never Used  . Alcohol Use: No    Review of Systems  Constitutional:   No fever or chills. No weight changes Eyes:   No blurry vision or double vision.  ENT:   No sore throat. Cardiovascular:   No chest pain. Respiratory:   No dyspnea or cough. Gastrointestinal:   Generalized abdominal pain without vomiting and diarrhea.  No BRBPR or melena. Normal bowel movements Genitourinary:   Negative for dysuria, urinary retention, bloody urine, or difficulty urinating. Musculoskeletal:   Negative for back pain. No joint swelling or pain. Skin:   Negative for rash. Neurological:   Negative for headaches, focal weakness or numbness. Psychiatric:  No anxiety or depression.   Endocrine:  No hot/cold intolerance, changes in energy, or sleep difficulty.  10-point ROS otherwise negative.  ____________________________________________   PHYSICAL EXAM:  VITAL SIGNS: ED Triage Vitals  Enc Vitals Group     BP 11/30/14 1338 104/68 mmHg     Pulse Rate 11/30/14 1338 73     Resp  11/30/14 1338 20     Temp 11/30/14 1338 97.5 F (36.4 C)     Temp Source 11/30/14 1338 Oral     SpO2 11/30/14 1338 100 %     Weight 11/30/14 1338 224 lb (101.606 kg)     Height 11/30/14 1338 5\' 4"  (1.626 m)     Head Cir --      Peak Flow --      Pain Score 11/30/14 1343 9     Pain Loc --      Pain Edu? --      Excl. in [place]? --      Constitutional:   Alert and oriented. Well appearing and in no distress. Eyes:   No scleral icterus. No conjunctival pallor. PERRL. EOMI ENT   Head:   Normocephalic and atraumatic.   Nose:   No congestion/rhinnorhea. No septal hematoma   Mouth/Throat:   MMM, mild pharyngeal erythema. No peritonsillar mass. No uvula shift.   Neck:   No stridor. No SubQ emphysema. No meningismus. Hematological/Lymphatic/Immunilogical:  No cervical lymphadenopathy. Cardiovascular:   RRR. Normal and symmetric distal pulses are present in all extremities. No murmurs, rubs, or gallops. Respiratory:   Normal respiratory effort without tachypnea nor retractions. Breath sounds are clear and equal bilaterally. No wheezes/rales/rhonchi. Gastrointestinal:   Soft with left upper quadrant tenderness and mild discomfort elsewhere. Exam nonfocal. No distention. There is no CVA tenderness.  No rebound, rigidity, or guarding. The ventral abdominal wall does show an infraumbilical midline vertical incision that is well-healed. The dermis is approximated and healed. There is only a slight defect in the epidermis along the length of the incision and there is no drainage or inflammation. The patient is wearing underwear with the elastic band pressing directly on the area of the incision that has not yet healed. Genitourinary:   deferred Musculoskeletal:   Nontender with normal range of motion in all extremities. No joint effusions.  No lower extremity tenderness.  No edema. Neurologic:   Normal speech and language.  CN 2-10 normal. Motor grossly intact. No pronator drift.  Normal  gait. No gross focal neurologic deficits are appreciated.  Skin:    Skin is warm, dry and intact. No rash noted.  No petechiae, purpura, or bullae. Psychiatric:   Mood and affect are normal. Speech and behavior are normal. Patient exhibits appropriate insight and judgment.  ____________________________________________    LABS (pertinent positives/negatives) (all labs ordered are listed, but only abnormal results are displayed) Labs Reviewed - No data to display ____________________________________________   EKG  Interpreted by me Normal sinus rhythm rate of 76, axis and intervals. Normal QRS and ST segments and T waves.  ____________________________________________    RADIOLOGY  Chest x-ray unremarkable  ____________________________________________   PROCEDURES   ____________________________________________   INITIAL IMPRESSION / ASSESSMENT AND PLAN / ED COURSE  Pertinent labs & imaging results that were available during my care of the patient were reviewed by me and considered in my medical decision making (see chart for details).  Patient presents with several symptoms, the first of which appears to be due to gastritis and acid reflux. We'll start her on Carafate and have her increase her Zantac to twice daily. Additionally her concerns about the abdominal wall appeared to be inconsequential at this time. The wound appears to be healing well and is not inflamed or infected on my exam. She is scheduled to follow-up with her primary care doctor in 2 days which I encouraged her to keep that appointment. Cardiopulmonary exam is unremarkable and reassuring. Chest x-ray and EKG are unremarkable. Low suspicion for ACS PE TAD pneumothorax carditis mediastinitis pneumonia or sepsis. I suspect that this odd sensation she is experiencing is related to the gastritis. No evidence of bowel obstruction or perforation.   ----------------------------------------- 4:53 PM on  11/30/2014 -----------------------------------------  When the nurse went to discuss discharge instructions with the patient, she noted that the abdominal wound was now slightly bleeding a little bit. I suspect the patient's been picking at it and has caused the wound to start bleeding. We will apply a cold clean dry gauze and Band-Aid. It is not hemorrhaging.  ____________________________________________   FINAL CLINICAL IMPRESSION(S) / ED DIAGNOSES  Final diagnoses:  Delayed surgical wound healing, subsequent encounter  Gastritis      Carrie Mew, MD 11/30/14 [phone]

## 2014-11-30 NOTE — ED Notes (Signed)
Pt present today with multiple complaints. Pt reports had surgery in July for bowel prolapse. Reports blood taste in mouth. States swelling at incision site. Slight redness noted to incision site. Pt states drainage from it last week. Also states unable to get oxygen in left lower lung. NAD noted, speaking in complete sentences, O2 100%.

## 2014-12-15 ENCOUNTER — Ambulatory Visit: Payer: Medicare Other | Admitting: Psychiatry

## 2014-12-15 DIAGNOSIS — R269 Unspecified abnormalities of gait and mobility: Secondary | ICD-10-CM | POA: Insufficient documentation

## 2014-12-17 ENCOUNTER — Other Ambulatory Visit: Payer: Self-pay | Admitting: Psychiatry

## 2014-12-17 NOTE — Telephone Encounter (Signed)
pt states she is out today on her lamictal. pt states she has an appt on 12-20-14 but she will not have enought to last until she is seen.

## 2014-12-20 ENCOUNTER — Encounter: Payer: Self-pay | Admitting: Psychiatry

## 2014-12-20 ENCOUNTER — Ambulatory Visit (INDEPENDENT_AMBULATORY_CARE_PROVIDER_SITE_OTHER): Payer: Medicare Other | Admitting: Psychiatry

## 2014-12-20 VITALS — BP 122/78 | HR 106 | Temp 98.2°F | Ht 64.8 in | Wt 235.2 lb

## 2014-12-20 DIAGNOSIS — F3170 Bipolar disorder, currently in remission, most recent episode unspecified: Secondary | ICD-10-CM | POA: Diagnosis not present

## 2014-12-20 DIAGNOSIS — F331 Major depressive disorder, recurrent, moderate: Secondary | ICD-10-CM

## 2014-12-20 DIAGNOSIS — F4310 Post-traumatic stress disorder, unspecified: Secondary | ICD-10-CM | POA: Diagnosis not present

## 2014-12-20 MED ORDER — ESCITALOPRAM OXALATE 20 MG PO TABS: 20.0000 mg | ORAL_TABLET | ORAL | Status: DC

## 2014-12-20 MED ORDER — HYDROXYZINE PAMOATE 50 MG PO CAPS: 50.0000 mg | ORAL_CAPSULE | Freq: Every evening | ORAL | Status: DC | PRN

## 2014-12-20 MED ORDER — LAMOTRIGINE 25 MG PO TABS: 50.0000 mg | ORAL_TABLET | Freq: Every day | ORAL | Status: DC

## 2014-12-20 NOTE — Progress Notes (Signed)
BH MD/PA/NP OP Progress Note  12/20/2014 12:15 PM Marie Clarke  MRN:  [id number]  Subjective:  She returns for follow-up for major depressive disorder, with mixed features and PTSD. Patient states she feels like her mood is under good control. She states she is engaged in things and she is excited because she got accepted to do one today in a day treatment program, [facility]. He states that she's sleeping well and her appetite is good. He is that she does spend her time going on walks. She at the last visit she discussed having some flashbacks regarding deceased relatives. However she states since the last visit and the medication increases this has not in a problem.  Chief Complaint: doing well with my meds Chief Complaint    Follow-up; Medication Refill     Visit Diagnosis:     ICD-9-CM ICD-10-CM   1. PTSD (post-traumatic stress disorder) 309.81 F43.10   2. Major depressive disorder, recurrent episode, moderate (HCC) 296.32 F33.1   3. Bipolar disorder in full remission, most recent episode unspecified type ([place]) 296.80 F31.70     Past Medical History:  Past Medical History  Diagnosis Date  . CAD (coronary artery disease) unk  . Diabetes mellitus without complication ([place])   . Hypertension   . Depression unk  . Pancreatitis unk  . Thyroid disease   . TIA (transient ischemic attack) unk  . Anxiety   . COPD (chronic obstructive pulmonary disease) ([place])   . Asthma   . Headache   . Diabetes mellitus, type II [facility])     Past Surgical History  Procedure Laterality Date  . Prolapse rectum surgery N/A   . Abdominal hysterectomy     Family History:  Family History  Problem Relation Age of Onset  . Diabetes Mellitus II Mother   . CAD Mother   . Sleep apnea Mother   . Osteoarthritis Mother   . Osteoporosis Mother   . Anxiety disorder Mother   . Depression Mother   . Bipolar disorder Mother   . Bipolar disorder Father   . Hypertension Father   . Depression Father   .  Anxiety disorder Father   . Post-traumatic stress disorder Sister    Social History:  Social History   Social History  . Marital Status: Single    Spouse Name: N/A  . Number of Children: N/A  . Years of Education: N/A   Social History Main Topics  . Smoking status: Former Smoker    Types: Cigarettes    Quit date: 09/09/2005  . Smokeless tobacco: Never Used  . Alcohol Use: No  . Drug Use: No  . Sexual Activity: Not Currently   Other Topics Concern  . None   Social History Narrative   Additional History:   Assessment:   Musculoskeletal: Strength & Muscle Tone: within normal limits Gait & Station: Slow and ambulates with a cane Patient leans: N/A  Psychiatric Specialty Exam: HPI  Review of Systems  Psychiatric/Behavioral: Negative for depression (Later thinking about her deceased relatives), suicidal ideas, hallucinations, memory loss and substance abuse. The patient is not nervous/anxious and does not have insomnia.   All other systems reviewed and are negative.   Blood pressure 122/78, pulse 106, temperature 98.2 F (36.8 C), temperature source Tympanic, height 5' 4.8" (1.646 m), weight 235 lb 3.2 oz (106.686 kg), SpO2 94 %.Body mass index is 39.38 kg/(m^2).  General Appearance: Well Groomed  Eye Contact:  Good  Speech:  Normal Rate  Volume:  Normal  Mood:  Good  Affect:  Congruent  Thought Process:  Linear  Orientation:  Full (Time, Place, and Person)  Thought Content:  Negative  Suicidal Thoughts:  No  Homicidal Thoughts:  No  Memory:  Immediate;   Good Recent;   Good Remote;   Good  Judgement:  Good  Insight:  Fair  Psychomotor Activity:  Negative  Concentration:  Good  Recall:  Good  Fund of Knowledge: Good  Language: Good  Akathisia:  Negative  Handed:  Right unknown   AIMS (if indicated):  N/A  Assets:  Desire for Improvement Social Support  ADL's:  Intact  Cognition: WNL  Sleep:  good   Is the patient at risk to self?  No. Has the  patient been a risk to self in the past 6 months?  No. Has the patient been a risk to self within the distant past?  Yes.   Is the patient a risk to others?  No. Has the patient been a risk to others in the past 6 months?  No. Has the patient been a risk to others within the distant past?  No.  Current Medications: Current Outpatient Prescriptions  Medication Sig Dispense Refill  . albuterol (PROVENTIL HFA;VENTOLIN HFA) 108 (90 BASE) MCG/ACT inhaler Inhale 2 puffs into the lungs every 4 (four) hours as needed for wheezing or shortness of breath.    Marland Kitchen aspirin EC 81 MG tablet Take 81 mg by mouth daily at 12 noon.    Marland Kitchen atorvastatin (LIPITOR) 40 MG tablet Take 40 mg by mouth at bedtime.     Marland Kitchen azelastine (ASTELIN) 0.1 % nasal spray Place 1 spray into both nostrils daily.    . cyclobenzaprine (FLEXERIL) 5 MG tablet Take 5 mg by mouth 3 (three) times daily as needed for muscle spasms.    Marland Kitchen escitalopram (LEXAPRO) 20 MG tablet Take 1 tablet (20 mg total) by mouth every morning. 30 tablet 2  . fluticasone-salmeterol (ADVAIR HFA) 230-21 MCG/ACT inhaler Inhale 2 puffs into the lungs 2 (two) times daily.    Marland Kitchen glipiZIDE (GLUCOTROL XL) 10 MG 24 hr tablet Take 10 mg by mouth daily as needed (for sugar greater than or equal to 200.).     Marland Kitchen hydrOXYzine (VISTARIL) 50 MG capsule Take 1 capsule (50 mg total) by mouth at bedtime as needed for anxiety. 30 capsule 2  . lamoTRIgine (LAMICTAL) 25 MG tablet Take 2 tablets (50 mg total) by mouth daily. 60 tablet 2  . levocetirizine (XYZAL) 5 MG tablet Take 5 mg by mouth at bedtime.     Marland Kitchen levothyroxine (SYNTHROID, LEVOTHROID) 200 MCG tablet Take 200 mcg by mouth daily.     Marland Kitchen LYRICA 200 MG capsule     . metoprolol succinate (TOPROL-XL) 25 MG 24 hr tablet Take 12.5 mg by mouth at bedtime.    . montelukast (SINGULAIR) 10 MG tablet Take 10 mg by mouth daily.     . Multiple Vitamin (MULTIVITAMIN WITH MINERALS) TABS tablet Take 1 tablet by mouth daily.    . Olopatadine HCl  (PATADAY) 0.2 % SOLN Apply 1 drop to eye as needed (for dry eyes).    . pantoprazole (PROTONIX) 40 MG tablet Take 40 mg by mouth 2 (two) times daily.    . pregabalin (LYRICA) 200 MG capsule Take 200 mg by mouth 3 (three) times daily.    . ranitidine (ZANTAC) 300 MG tablet Take 1 tablet (300 mg total) by mouth 2 (two) times daily. 60 tablet 0  .  ranolazine (RANEXA) 500 MG 12 hr tablet Take 500 mg by mouth 2 (two) times daily.    . solifenacin (VESICARE) 10 MG tablet Take 10 mg by mouth at bedtime.    . sucralfate (CARAFATE) 1 G tablet Take 1 tablet (1 g total) by mouth 4 (four) times daily. 120 tablet 1  . tiotropium (SPIRIVA) 18 MCG inhalation capsule Place 18 mcg into inhaler and inhale at bedtime.    . topiramate (TOPAMAX) 100 MG tablet Take 100 mg by mouth 3 (three) times daily.    Marland Kitchen torsemide (DEMADEX) 20 MG tablet Take 20 mg by mouth daily.    . ferrous sulfate 325 (65 FE) MG tablet Take 325 mg by mouth daily.    Marland Kitchen ibuprofen (ADVIL,MOTRIN) 800 MG tablet Take 800 mg by mouth 3 (three) times daily.    . pregabalin (LYRICA) 100 MG capsule Take 100 mg by mouth 4 (four) times daily.      No current facility-administered medications for this visit.    Medical Decision Making:  Established Problem, Stable/Improving (1), Review of Medication Regimen & Side Effects (2) and Review of New Medication or Change in Dosage (2)  Treatment Plan Summary:Medication management and Plan   Major depressive disorder-We'll continue the patient on her Lamictal 50 mg daily and  Lexapro discussed below.    Posttraumatic stress disorder- Continue Lexapro 20 mg. patient states she is not using the Vistaril during the day but rather taking 50 mg at bedtime and she finds this is been helpful for sleep. She is aware she can take 2 of her 25 mg capsules however her new prescription will be for 50 mg capsules and she is aware she needs to just take one of those when she gets her new bottle.  Patient will follow up in  2 months. She's been encouraged call any questions or concerns prior to her next appointment.  Faith Rogue 12/20/2014, 12:15 PM

## 2015-01-13 ENCOUNTER — Telehealth: Payer: Self-pay

## 2015-01-13 DIAGNOSIS — F4310 Post-traumatic stress disorder, unspecified: Secondary | ICD-10-CM | POA: Insufficient documentation

## 2015-01-13 NOTE — Telephone Encounter (Signed)
pt called states that she wonder if she has bipolar and ptsd. pt states that people told her that she makes noises when she eats, and pt states that her mood swings are really high,  if she happy she really really happy and when she down she down.  pt states that she is going to take a hyrdroxyzine and see if that helps but that she is worried that her medications are not right.  Pt does not want to come back in because she has so many appt now because of her surgery.

## 2015-01-13 NOTE — Telephone Encounter (Signed)
called pt back. pt states that she really did not want to come in, that she has so many appt now she states that she will wait until her next appt.  pt states that if she gets worse she will call and come in early.

## 2015-02-08 NOTE — Progress Notes (Signed)
Pharmacy notified.

## 2015-02-16 ENCOUNTER — Ambulatory Visit (INDEPENDENT_AMBULATORY_CARE_PROVIDER_SITE_OTHER): Payer: Medicare HMO | Admitting: Psychiatry

## 2015-02-16 ENCOUNTER — Telehealth: Payer: Self-pay | Admitting: Psychiatry

## 2015-02-16 ENCOUNTER — Encounter: Payer: Self-pay | Admitting: Psychiatry

## 2015-02-16 VITALS — BP 128/78 | HR 122 | Temp 97.9°F | Ht 64.8 in | Wt 242.0 lb

## 2015-02-16 DIAGNOSIS — F331 Major depressive disorder, recurrent, moderate: Secondary | ICD-10-CM

## 2015-02-16 DIAGNOSIS — F4310 Post-traumatic stress disorder, unspecified: Secondary | ICD-10-CM

## 2015-02-16 MED ORDER — ESCITALOPRAM OXALATE 20 MG PO TABS: 20.0000 mg | ORAL_TABLET | ORAL | Status: DC

## 2015-02-16 MED ORDER — HYDROXYZINE PAMOATE 50 MG PO CAPS: 50.0000 mg | ORAL_CAPSULE | Freq: Three times a day (TID) | ORAL | Status: DC | PRN

## 2015-02-16 MED ORDER — LAMOTRIGINE 25 MG PO TABS: ORAL_TABLET | ORAL | Status: DC

## 2015-02-16 NOTE — Progress Notes (Signed)
BH MD/PA/NP OP Progress Note  02/16/2015 12:31 PM Marie Clarke  MRN:  [id number]  Subjective:  She returns for follow-up for major depressive disorder, with mixed features and PTSD. Patient indicates that she feels like she's had more mood swings since the holidays. She states that she feels like her mood can swing to being highlighted very low. She indicates that she is able to enjoy her day treatment program and is excited because it is been extended high days a week.  Chief Complaint: doing well with my meds Chief Complaint    Follow-up; Medication Refill     Visit Diagnosis:   No diagnosis found.  Past Medical History:  Past Medical History  Diagnosis Date  . CAD (coronary artery disease) unk  . Diabetes mellitus without complication ([place])   . Hypertension   . Depression unk  . Pancreatitis unk  . Thyroid disease   . TIA (transient ischemic attack) unk  . Anxiety   . COPD (chronic obstructive pulmonary disease) ([place])   . Asthma   . Headache   . Diabetes mellitus, type II [facility])     Past Surgical History  Procedure Laterality Date  . Prolapse rectum surgery N/A   . Abdominal hysterectomy     Family History:  Family History  Problem Relation Age of Onset  . Diabetes Mellitus II Mother   . CAD Mother   . Sleep apnea Mother   . Osteoarthritis Mother   . Osteoporosis Mother   . Anxiety disorder Mother   . Depression Mother   . Bipolar disorder Mother   . Bipolar disorder Father   . Hypertension Father   . Depression Father   . Anxiety disorder Father   . Post-traumatic stress disorder Sister    Social History:  Social History   Social History  . Marital Status: Single    Spouse Name: N/A  . Number of Children: N/A  . Years of Education: N/A   Social History Main Topics  . Smoking status: Former Smoker    Types: Cigarettes    Quit date: 09/09/2005  . Smokeless tobacco: Never Used  . Alcohol Use: No  . Drug Use: No  . Sexual Activity: Not Currently    Other Topics Concern  . None   Social History Narrative   Additional History:   Assessment:   Musculoskeletal: Strength & Muscle Tone: within normal limits Gait & Station: Slow and ambulates with a cane Patient leans: N/A  Psychiatric Specialty Exam: HPI  Review of Systems  Psychiatric/Behavioral: Positive for depression (she's reporting some day-to-day fluctuation in her mood from depressed to being upbeat). Negative for suicidal ideas, hallucinations, memory loss and substance abuse. The patient is not nervous/anxious and does not have insomnia.   All other systems reviewed and are negative.   Blood pressure 128/78, pulse 122, temperature 97.9 F (36.6 C), temperature source Tympanic, height 5' 4.8" (1.646 m), weight 242 lb (109.77 kg), SpO2 92 %.Body mass index is 40.52 kg/(m^2).  General Appearance: Well Groomed  Eye Contact:  Good  Speech:  Normal Rate  Volume:  Normal  Mood:  Good  Affect:  Congruent  Thought Process:  Linear  Orientation:  Full (Time, Place, and Person)  Thought Content:  Negative  Suicidal Thoughts:  No  Homicidal Thoughts:  No  Memory:  Immediate;   Good Recent;   Good Remote;   Good  Judgement:  Good  Insight:  Fair  Psychomotor Activity:  Negative  Concentration:  Good  Recall:  Good  Fund of Knowledge: Good  Language: Good  Akathisia:  Negative  Handed:  Right unknown   AIMS (if indicated):  N/A  Assets:  Desire for Improvement Social Support  ADL's:  Intact  Cognition: WNL  Sleep:  good   Is the patient at risk to self?  No. Has the patient been a risk to self in the past 6 months?  No. Has the patient been a risk to self within the distant past?  Yes.   Is the patient a risk to others?  No. Has the patient been a risk to others in the past 6 months?  No. Has the patient been a risk to others within the distant past?  No.  Current Medications: Current Outpatient Prescriptions  Medication Sig Dispense Refill  . albuterol  (PROVENTIL HFA;VENTOLIN HFA) 108 (90 BASE) MCG/ACT inhaler Inhale 2 puffs into the lungs every 4 (four) hours as needed for wheezing or shortness of breath.    Marland Kitchen amoxicillin (AMOXIL) 400 MG/5ML suspension give 6.3 milliliters by mouth twice a day for 10 days  0  . aspirin EC 81 MG tablet Take 81 mg by mouth daily at 12 noon.    Marland Kitchen atorvastatin (LIPITOR) 40 MG tablet Take 40 mg by mouth at bedtime.     Marland Kitchen azelastine (ASTELIN) 0.1 % nasal spray Place 1 spray into both nostrils daily.    . cyclobenzaprine (FLEXERIL) 5 MG tablet Take 5 mg by mouth 3 (three) times daily as needed for muscle spasms.    Marland Kitchen escitalopram (LEXAPRO) 20 MG tablet Take 1 tablet (20 mg total) by mouth every morning. 30 tablet 4  . fluticasone-salmeterol (ADVAIR HFA) 230-21 MCG/ACT inhaler Inhale 2 puffs into the lungs 2 (two) times daily.    Marland Kitchen glipiZIDE (GLUCOTROL XL) 10 MG 24 hr tablet Take 10 mg by mouth daily as needed (for sugar greater than or equal to 200.).     Marland Kitchen hydrOXYzine (VISTARIL) 50 MG capsule Take 1 capsule (50 mg total) by mouth 3 (three) times daily as needed for anxiety. 90 capsule 4  . ibuprofen (ADVIL,MOTRIN) 800 MG tablet Take 800 mg by mouth 3 (three) times daily.    Marland Kitchen lamoTRIgine (LAMICTAL) 25 MG tablet Take two tablets in the morning and tablet at bedtime for one week, the increase to two tablets twice daily. 120 tablet 4  . levocetirizine (XYZAL) 5 MG tablet Take 5 mg by mouth at bedtime.     Marland Kitchen levothyroxine (SYNTHROID, LEVOTHROID) 200 MCG tablet Take 200 mcg by mouth daily.     Marland Kitchen LYRICA 200 MG capsule     . metoprolol succinate (TOPROL-XL) 25 MG 24 hr tablet Take 12.5 mg by mouth at bedtime.    . montelukast (SINGULAIR) 10 MG tablet Take 10 mg by mouth daily.     . Multiple Vitamin (MULTIVITAMIN WITH MINERALS) TABS tablet Take 1 tablet by mouth daily.    . Olopatadine HCl (PATADAY) 0.2 % SOLN Apply 1 drop to eye as needed (for dry eyes).    . pantoprazole (PROTONIX) 40 MG tablet Take 40 mg by mouth 2 (two)  times daily.    . pregabalin (LYRICA) 200 MG capsule Take 200 mg by mouth 3 (three) times daily.    . ranitidine (ZANTAC) 300 MG tablet Take 1 tablet (300 mg total) by mouth 2 (two) times daily. 60 tablet 0  . ranolazine (RANEXA) 500 MG 12 hr tablet Take 500 mg by mouth 2 (two) times daily.    Marland Kitchen  solifenacin (VESICARE) 10 MG tablet Take 10 mg by mouth at bedtime.    . sucralfate (CARAFATE) 1 G tablet Take 1 tablet (1 g total) by mouth 4 (four) times daily. 120 tablet 1  . tiotropium (SPIRIVA) 18 MCG inhalation capsule Place 18 mcg into inhaler and inhale at bedtime.    . topiramate (TOPAMAX) 100 MG tablet Take 100 mg by mouth 3 (three) times daily.    Marland Kitchen torsemide (DEMADEX) 20 MG tablet Take 20 mg by mouth daily.     No current facility-administered medications for this visit.    Medical Decision Making:  Established Problem, Stable/Improving (1), Review of Medication Regimen & Side Effects (2) and Review of New Medication or Change in Dosage (2)  Treatment Plan Summary:Medication management and Plan   Major depressive disorder-Continue Lexapro 20 mg daily. We are going to increase her limit told to [place] depression as well as mood stability. Thus she'll go from 25 mg twice daily to 50 mg in the morning and 25 mg at bedtime for 1 week and then she will go to 50 mg twice daily   Posttraumatic stress disorder- Continue Lexapro 20 mg. he will increase her Vistaril to 50 mg 3 times a day as needed. Patient reports benefit from it and denies any sedation from the Vistaril .  Patient will follow up in 1 months. He is aware of my departure in February but will see me in one month follow-up prior to my departure. She has been aware she'll be followed with another provider within this clinic. She's been encouraged call any questions or concerns prior to her next appointment.  Faith Rogue 02/16/2015, 12:31 PM

## 2015-02-16 NOTE — Progress Notes (Signed)
Refilled - reordered

## 2015-02-17 NOTE — Telephone Encounter (Signed)
pt wants letter mailed out - letter put in mail

## 2015-02-24 ENCOUNTER — Emergency Department
Admission: EM | Admit: 2015-02-24 | Discharge: 2015-02-24 | Disposition: A | Discharge status: Home / Self Care | Payer: Medicare HMO | Attending: Emergency Medicine | Admitting: Emergency Medicine

## 2015-02-24 ENCOUNTER — Emergency Department: Payer: Medicare HMO

## 2015-02-24 ENCOUNTER — Encounter: Payer: Self-pay | Admitting: *Deleted

## 2015-02-24 DIAGNOSIS — Z87891 Personal history of nicotine dependence: Secondary | ICD-10-CM | POA: Insufficient documentation

## 2015-02-24 DIAGNOSIS — I129 Hypertensive chronic kidney disease with stage 1 through stage 4 chronic kidney disease, or unspecified chronic kidney disease: Secondary | ICD-10-CM | POA: Insufficient documentation

## 2015-02-24 DIAGNOSIS — Z7982 Long term (current) use of aspirin: Secondary | ICD-10-CM | POA: Diagnosis not present

## 2015-02-24 DIAGNOSIS — Z7951 Long term (current) use of inhaled steroids: Secondary | ICD-10-CM | POA: Diagnosis not present

## 2015-02-24 DIAGNOSIS — Z792 Long term (current) use of antibiotics: Secondary | ICD-10-CM | POA: Diagnosis not present

## 2015-02-24 DIAGNOSIS — R29810 Facial weakness: Secondary | ICD-10-CM | POA: Insufficient documentation

## 2015-02-24 DIAGNOSIS — N189 Chronic kidney disease, unspecified: Secondary | ICD-10-CM | POA: Diagnosis not present

## 2015-02-24 DIAGNOSIS — R040 Epistaxis: Secondary | ICD-10-CM

## 2015-02-24 DIAGNOSIS — E119 Type 2 diabetes mellitus without complications: Secondary | ICD-10-CM | POA: Insufficient documentation

## 2015-02-24 DIAGNOSIS — Z79899 Other long term (current) drug therapy: Secondary | ICD-10-CM | POA: Diagnosis not present

## 2015-02-24 DIAGNOSIS — R51 Headache: Secondary | ICD-10-CM | POA: Diagnosis not present

## 2015-02-24 HISTORY — DX: Acute myocardial infarction, unspecified: I21.9

## 2015-02-24 LAB — CBC
HEMATOCRIT: 34.9 % — AB (ref 35.0–47.0)
HEMOGLOBIN: 11.3 g/dL — AB (ref 12.0–16.0)
MCH: 26.8 pg (ref 26.0–34.0)
MCHC: 32.3 g/dL (ref 32.0–36.0)
MCV: 82.9 fL (ref 80.0–100.0)
Platelets: 232 10*3/uL (ref 150–440)
RBC: 4.21 MIL/uL (ref 3.80–5.20)
RDW: 17.1 % — AB (ref 11.5–14.5)
WBC: 6.6 10*3/uL (ref 3.6–11.0)

## 2015-02-24 MED ORDER — OXYMETAZOLINE HCL 0.05 % NA SOLN: 2.0000 | NASAL | Status: DC | PRN

## 2015-02-24 NOTE — ED Notes (Signed)
States her hands were jerking and a headache began and then nosebleed started, pt on daily ASA, clamp on nose during triage

## 2015-02-24 NOTE — ED Notes (Signed)
States went to bathroom and felt like she was jerking, had headache and then she blew her nose, symptoms went away and the her nosr started to bleed, nasal clamp removed, no further bleeding, states she has theses same symptoms when she had her stroke

## 2015-02-24 NOTE — ED Provider Notes (Signed)
[facility] Emergency Department Provider Note  ____________________________________________  Time seen: Approximately 2:05 PM  I have reviewed the triage vital signs and the nursing notes.   HISTORY  Chief Complaint Epistaxis    HPI Marie Clarke is a 45 y.o. female who presents to the emergency department complaining of a nosebleed. Per the patient she will had a "shaking episode" while she was working at her computer and she went to the bathroom and had a headache and a nosebleed started. Patient states that she has a history of TIA versus CVA in the past and states that the last time she had a CVA symptoms noted in this manner. Per the patient she has had significant epistaxis but has finally been able to control bleeding. Patient does endorse a headache. She denies any neck pain, chest pain, shortness breath, nausea or vomiting, diarrhea or constipation.   Past Medical History  Diagnosis Date  . CAD (coronary artery disease) unk  . Diabetes mellitus without complication ([place])   . Hypertension   . Depression unk  . Pancreatitis unk  . Thyroid disease   . TIA (transient ischemic attack) unk  . Anxiety   . COPD (chronic obstructive pulmonary disease) ([place])   . Asthma   . Headache   . Diabetes mellitus, type II ([place])   . MI (myocardial infarction) [facility])     Patient Active Problem List   Diagnosis Date Noted  . 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 Abnormal gait 12/15/2014  . Congestive heart failure ([place]) 11/15/2014  . Abdominal wall abscess 09/20/2014  . Muscle ache 09/16/2014  . 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 Diabetes mellitus, type 2 ([place]) 08/13/2014  . Bipolar affective disorder ([place]) 08/13/2014  . Type 2 diabetes mellitus ([place]) 08/13/2014  . Rectal prolapse 08/09/2014  . Rectal bleeding 08/09/2014  . Rectal bleed 08/09/2014  . Affective bipolar disorder ([place]) 08/05/2014  . Arteriosclerosis of coronary artery 08/05/2014  . [facility]  (congestive cardiac failure) ([place]) 08/05/2014  . Chronic kidney disease 08/05/2014  . 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 Apnea, sleep 08/05/2014  . Temporary cerebral vascular dysfunction 08/05/2014  . Chronic LBP 04/29/2014  . Polypharmacy 04/29/2014  . Left leg pain 04/29/2014  . Other long term (current) drug therapy 04/29/2014  . 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 Chronic kidney disease, stage III (moderate) 12/14/2013  . Controlled diabetes mellitus type II without complication ([place]) 99991111  . Essential (primary) hypertension 12/03/2013  . Adult hypothyroidism 12/03/2013  . Controlled type 2 diabetes mellitus without complication ([place]) 99991111    Past Surgical History  Procedure Laterality Date  . Prolapse rectum surgery N/A   . Abdominal hysterectomy      Current Outpatient Rx  Name  Route  Sig  Dispense  Refill  . albuterol (PROVENTIL HFA;VENTOLIN HFA) 108 (90 BASE) MCG/ACT inhaler   Inhalation   Inhale 2 puffs into the lungs every 4 (four) hours as needed for wheezing or shortness of breath.         Marland Kitchen amoxicillin (AMOXIL) 400 MG/5ML suspension      give 6.3 milliliters by mouth twice a day for 10 days      0   . aspirin EC 81 MG tablet   Oral   Take 81 mg by mouth daily at 12 noon.         Marland Kitchen atorvastatin (LIPITOR) 40 MG tablet   Oral   Take 40 mg by mouth at bedtime.          Marland Kitchen  azelastine (ASTELIN) 0.1 % nasal spray   Each Nare   Place 1 spray into both nostrils daily.         . cyclobenzaprine (FLEXERIL) 5 MG tablet   Oral   Take 5 mg by mouth 3 (three) times daily as needed for muscle spasms.         Marland Kitchen escitalopram (LEXAPRO) 20 MG tablet   Oral   Take 1 tablet (20 mg total) by mouth every morning.   30 tablet   4   . fluticasone-salmeterol (ADVAIR HFA) 230-21 MCG/ACT inhaler   Inhalation   Inhale 2 puffs into the lungs 2 (two) times daily.         Marland Kitchen glipiZIDE (GLUCOTROL XL) 10 MG 24 hr tablet   Oral   Take 10 mg by  mouth daily as needed (for sugar greater than or equal to 200.).          Marland Kitchen hydrOXYzine (VISTARIL) 50 MG capsule   Oral   Take 1 capsule (50 mg total) by mouth 3 (three) times daily as needed for anxiety.   90 capsule   4   . ibuprofen (ADVIL,MOTRIN) 800 MG tablet   Oral   Take 800 mg by mouth 3 (three) times daily.         Marland Kitchen lamoTRIgine (LAMICTAL) 25 MG tablet      Take two tablets in the morning and tablet at bedtime for one week, the increase to two tablets twice daily.   120 tablet   4   . levocetirizine (XYZAL) 5 MG tablet   Oral   Take 5 mg by mouth at bedtime.          Marland Kitchen levothyroxine (SYNTHROID, LEVOTHROID) 200 MCG tablet   Oral   Take 200 mcg by mouth daily.          Marland Kitchen LYRICA 200 MG capsule                 Dispense as written.   . metoprolol succinate (TOPROL-XL) 25 MG 24 hr tablet   Oral   Take 12.5 mg by mouth at bedtime.         . montelukast (SINGULAIR) 10 MG tablet   Oral   Take 10 mg by mouth daily.          . Multiple Vitamin (MULTIVITAMIN WITH MINERALS) TABS tablet   Oral   Take 1 tablet by mouth daily.         . Olopatadine HCl (PATADAY) 0.2 % SOLN   Ophthalmic   Apply 1 drop to eye as needed (for dry eyes).         Marland Kitchen oxymetazoline (AFRIN) 0.05 % nasal spray   Each Nare   Place 2 sprays into both nostrils as needed (Nosebleed).   15 mL   2   . pantoprazole (PROTONIX) 40 MG tablet   Oral   Take 40 mg by mouth 2 (two) times daily.         . pregabalin (LYRICA) 200 MG capsule   Oral   Take 200 mg by mouth 3 (three) times daily.         . ranitidine (ZANTAC) 300 MG tablet   Oral   Take 1 tablet (300 mg total) by mouth 2 (two) times daily.   60 tablet   0   . ranolazine (RANEXA) 500 MG 12 hr tablet   Oral   Take 500 mg by mouth 2 (two) times daily.         Marland Kitchen  solifenacin (VESICARE) 10 MG tablet   Oral   Take 10 mg by mouth at bedtime.         . sucralfate (CARAFATE) 1 G tablet   Oral   Take 1 tablet (1  g total) by mouth 4 (four) times daily.   120 tablet   1   . tiotropium (SPIRIVA) 18 MCG inhalation capsule   Inhalation   Place 18 mcg into inhaler and inhale at bedtime.         . topiramate (TOPAMAX) 100 MG tablet   Oral   Take 100 mg by mouth 3 (three) times daily.         Marland Kitchen torsemide (DEMADEX) 20 MG tablet   Oral   Take 20 mg by mouth daily.           Allergies Sulfa antibiotics; Azithromycin; Cephalexin; Ciprofloxacin; Flagyl; Levofloxacin; Vancomycin; and Pregabalin  Family History  Problem Relation Age of Onset  . Diabetes Mellitus II Mother   . CAD Mother   . Sleep apnea Mother   . Osteoarthritis Mother   . Osteoporosis Mother   . Anxiety disorder Mother   . Depression Mother   . Bipolar disorder Mother   . Bipolar disorder Father   . Hypertension Father   . Depression Father   . Anxiety disorder Father   . Post-traumatic stress disorder Sister     Social History Social History  Substance Use Topics  . Smoking status: Former Smoker    Types: Cigarettes    Quit date: 09/09/2005  . Smokeless tobacco: Never Used  . Alcohol Use: No     Review of Systems  Constitutional: No fever/chills Eyes: No visual changes. No discharge ENT: No sore throat. Positive for epistaxis. Cardiovascular: no chest pain. Respiratory: no cough. No SOB. Gastrointestinal: No abdominal pain.  No nausea, no vomiting.  No diarrhea.  No constipation. Genitourinary: Negative for dysuria. No hematuria Musculoskeletal: Negative for back pain. Skin: Negative for rash. Neurological: Positive for headaches, endorses right-sided weakness. 10-point ROS otherwise negative.  ____________________________________________   PHYSICAL EXAM:  VITAL SIGNS: ED Triage Vitals  Enc Vitals Group     BP 02/24/15 1140 114/68 mmHg     Pulse Rate 02/24/15 1140 97     Resp 02/24/15 1140 18     Temp 02/24/15 1140 97.7 F (36.5 C)     Temp Source 02/24/15 1140 Oral     SpO2 02/24/15 1140  95 %     Weight 02/24/15 1140 235 lb (106.595 kg)     Height 02/24/15 1140 5\' 4"  (1.626 m)     Head Cir --      Peak Flow --      Pain Score 02/24/15 1141 7     Pain Loc --      Pain Edu? --      Excl. in [place]? --      Constitutional: Alert and oriented. Well appearing and in no acute distress. Eyes: Conjunctivae are normal. PERRL. EOMI. Head: Atraumatic. ENT:      Ears: EACs and TMs are unremarkable bilaterally.      Nose: No congestion/rhinnorhea. Mild blood visualized distal portion nose. Scabbing of the Kiesselbach plexus.      Mouth/Throat: Mucous membranes are moist.  Neck: No stridor.   Hematological/Lymphatic/Immunilogical: No cervical lymphadenopathy. Cardiovascular: Normal rate, regular rhythm. Normal S1 and S2.  Good peripheral circulation. Respiratory: Normal respiratory effort without tachypnea or retractions. Lungs CTAB. Gastrointestinal: Soft and nontender. No distention. No CVA tenderness.  Musculoskeletal: No lower extremity tenderness nor edema.  No joint effusions. Neurologic:  Normal speech and language. Cranial nerves II through XII were intact. Some facial droop on right side is identified. Romberg is positive for left-sided drift. Grip strength decreased on right. Sensation slightly decreased on right versus left. Skin:  Skin is warm, dry and intact. No rash noted. Psychiatric: Mood and affect are normal. Speech and behavior are normal. Patient exhibits appropriate insight and judgement.   ____________________________________________   LABS (all labs ordered are listed, but only abnormal results are displayed)  Labs Reviewed  CBC - Abnormal; Notable for the following:    Hemoglobin 11.3 (*)    HCT 34.9 (*)    RDW 17.1 (*)    All other components within normal limits   ____________________________________________  EKG   ____________________________________________  RADIOLOGY Diamantina [facility] Genia Perin, personally viewed and evaluated these images  (plain radiographs) as part of my medical decision making, as well as reviewing the written report by the radiologist.  Ct Head Wo Contrast  02/24/2015  CLINICAL DATA:  Headache following seizure.  Facial droop EXAM: CT HEAD WITHOUT CONTRAST TECHNIQUE: Contiguous axial images were obtained from the base of the skull through the vertex without intravenous contrast. COMPARISON:  None. FINDINGS: The ventricles are normal in size and configuration. There is no intracranial mass, hemorrhage, extra-axial fluid collection, or midline shift. The gray-white compartments appear normal. No acute infarct is evident. The bony calvarium appears intact. The visualized mastoid air cells are clear. No intraorbital lesions are identified visualized orbital regions. IMPRESSION: No intracranial mass, hemorrhage, or focal gray -white compartment lesions/acute appearing infarct. Electronically Signed   By: Lowella Grip III M.D.   On: 02/24/2015 14:33    ____________________________________________    PROCEDURES  Procedure(s) performed:       Medications - No data to display   ____________________________________________   INITIAL IMPRESSION / ASSESSMENT AND PLAN / ED COURSE  Pertinent labs & imaging results that were available during my care of the patient were reviewed by me and considered in my medical decision making (see chart for details).  Patient's diagnosis is consistent with epistaxis. Patient presented with some neurological symptoms. He was unable to be determined whether these were new or chronic. Due to uncertainty CT head was performed with no acute abnormalities. Patient is informed of her diagnosis and her clear CT scan. Patient verbalizes understanding of her diagnosis and treatment plan.. Patient will be discharged home with prescriptions for Afrin nasal spray should epistaxis return. Patient is to follow up with primary care provider if symptoms persist past this treatment course.  Patient is given ED precautions to return to the ED for any worsening or new symptoms.     ____________________________________________  FINAL CLINICAL IMPRESSION(S) / ED DIAGNOSES  Final diagnoses:  Epistaxis      NEW MEDICATIONS STARTED DURING THIS VISIT:  Discharge Medication List as of 02/24/2015  4:05 PM    START taking these medications   Details  oxymetazoline (AFRIN) 0.05 % nasal spray Place 2 sprays into both nostrils as needed (Nosebleed)., Starting 02/24/2015, Until Discontinued, [place], PA-C 02/24/15 1912  Carrie Mew, MD 02/26/15 1325

## 2015-02-24 NOTE — Discharge Instructions (Signed)

## 2015-02-24 NOTE — ED Notes (Signed)
Per Dr. Cinda Quest no need for head CT

## 2015-03-03 ENCOUNTER — Emergency Department
Admission: EM | Admit: 2015-03-03 | Discharge: 2015-03-03 | Disposition: A | Discharge status: Home / Self Care | Payer: Medicare HMO | Attending: Emergency Medicine | Admitting: Emergency Medicine

## 2015-03-03 ENCOUNTER — Encounter: Payer: Self-pay | Admitting: Emergency Medicine

## 2015-03-03 ENCOUNTER — Emergency Department: Payer: Medicare HMO

## 2015-03-03 DIAGNOSIS — E119 Type 2 diabetes mellitus without complications: Secondary | ICD-10-CM | POA: Diagnosis not present

## 2015-03-03 DIAGNOSIS — G8929 Other chronic pain: Secondary | ICD-10-CM | POA: Diagnosis not present

## 2015-03-03 DIAGNOSIS — Z87891 Personal history of nicotine dependence: Secondary | ICD-10-CM | POA: Insufficient documentation

## 2015-03-03 DIAGNOSIS — Z7951 Long term (current) use of inhaled steroids: Secondary | ICD-10-CM | POA: Diagnosis not present

## 2015-03-03 DIAGNOSIS — M545 Low back pain: Secondary | ICD-10-CM

## 2015-03-03 DIAGNOSIS — N189 Chronic kidney disease, unspecified: Secondary | ICD-10-CM | POA: Diagnosis not present

## 2015-03-03 DIAGNOSIS — Z7982 Long term (current) use of aspirin: Secondary | ICD-10-CM | POA: Insufficient documentation

## 2015-03-03 DIAGNOSIS — Z791 Long term (current) use of non-steroidal anti-inflammatories (NSAID): Secondary | ICD-10-CM | POA: Insufficient documentation

## 2015-03-03 DIAGNOSIS — I129 Hypertensive chronic kidney disease with stage 1 through stage 4 chronic kidney disease, or unspecified chronic kidney disease: Secondary | ICD-10-CM | POA: Insufficient documentation

## 2015-03-03 DIAGNOSIS — Z79899 Other long term (current) drug therapy: Secondary | ICD-10-CM | POA: Diagnosis not present

## 2015-03-03 MED ORDER — KETOROLAC TROMETHAMINE 60 MG/2ML IM SOLN
60.0000 mg | Freq: Once | INTRAMUSCULAR | Status: AC
  Administered 2015-03-03: 60 mg via INTRAMUSCULAR
  Filled 2015-03-03: qty 2

## 2015-03-03 MED ORDER — DICLOFENAC SODIUM 75 MG PO TBEC: 75.0000 mg | DELAYED_RELEASE_TABLET | Freq: Two times a day (BID) | ORAL | Status: DC

## 2015-03-03 MED ORDER — BACLOFEN 10 MG PO TABS: 10.0000 mg | ORAL_TABLET | Freq: Three times a day (TID) | ORAL | Status: DC

## 2015-03-03 NOTE — ED Notes (Signed)
Pt via ems from home with chronic back pain; states that she has been hurting for several days but that it is worse today.

## 2015-03-03 NOTE — ED Notes (Signed)
Pt presents with chronic lower back pain; states she has not seen pain management clinic since September because they have not helped her. She states she has not had a ct or mri or any other diagnostics to determine what is wrong with her back. Pt states she takes lyrica without relief and that she has pain 10/10 "or higher." pt tearful during assessment. This nurse discussed chronic pain with pt and informed that we in the ED are not equipped to treat chronic pain and that she needs to see her pcp for management of this problem. Pt states "please help me."

## 2015-03-03 NOTE — Discharge Instructions (Signed)
Back Pain, Adult Back pain is very common in adults.The cause of back pain is rarely dangerous and the pain often gets better over time.The cause of your back pain may not be known. Some common causes of back pain include:  Strain of the muscles or ligaments supporting the spine.  Wear and tear (degeneration) of the spinal disks.  Arthritis.  Direct injury to the back. For many people, back pain may return. Since back pain is rarely dangerous, most people can learn to manage this condition on their own. HOME CARE INSTRUCTIONS Watch your back pain for any changes. The following actions may help to lessen any discomfort you are feeling:  Remain active. It is stressful on your back to sit or stand in one place for long periods of time. Do not sit, drive, or stand in one place for more than 30 minutes at a time. Take short walks on even surfaces as soon as you are able.Try to increase the length of time you walk each day.  Exercise regularly as directed by your health care provider. Exercise helps your back heal faster. It also helps avoid future injury by keeping your muscles strong and flexible.  Do not stay in bed.Resting more than 1-2 days can delay your recovery.  Pay attention to your body when you bend and lift. The most comfortable positions are those that put less stress on your recovering back. Always use proper lifting techniques, including:  Bending your knees.  Keeping the load close to your body.  Avoiding twisting.  Find a comfortable position to sleep. Use a firm mattress and lie on your side with your knees slightly bent. If you lie on your back, put a pillow under your knees.  Avoid feeling anxious or stressed.Stress increases muscle tension and can worsen back pain.It is important to recognize when you are anxious or stressed and learn ways to manage it, such as with exercise.  Take medicines only as directed by your health care provider. Over-the-counter  medicines to reduce pain and inflammation are often the most helpful.Your health care provider may prescribe muscle relaxant drugs.These medicines help dull your pain so you can more quickly return to your normal activities and healthy exercise.  Apply ice to the injured area:  Put ice in a plastic bag.  Place a towel between your skin and the bag.  Leave the ice on for 20 minutes, 2-3 times a day for the first 2-3 days. After that, ice and heat may be alternated to reduce pain and spasms.  Maintain a healthy weight. Excess weight puts extra stress on your back and makes it difficult to maintain good posture.  WEAR BACK BRACE AS NEEDED FOR COMFORT! SEEK MEDICAL CARE IF:  You have pain that is not relieved with rest or medicine.  You have increasing pain going down into the legs or buttocks.  You have pain that does not improve in one week.  You have night pain.  You lose weight.  You have a fever or chills. SEEK IMMEDIATE MEDICAL CARE IF:   You develop new bowel or bladder control problems.  You have unusual weakness or numbness in your arms or legs.  You develop nausea or vomiting.  You develop abdominal pain.  You feel faint.   This information is not intended to replace advice given to you by your health care provider. Make sure you discuss any questions you have with your health care provider.   Document Released: 01/29/2005 Document Revised: 02/19/2014 Document  Reviewed: 06/02/2013 Elsevier Interactive Patient Education [employer].

## 2015-03-03 NOTE — ED Provider Notes (Signed)
[facility] Emergency Department Provider Note  ____________________________________________  Time seen: Approximately 9:27 AM  I have reviewed the triage vital signs and the nursing notes.   HISTORY  Chief Complaint Back Pain    HPI Marie Clarke is a 45 y.o. female patient presents via EMS with a history of chronic low back pain, hypertension, depression, diabetes. Patient currently followed by pain management in the room for chronic neck pain. Patient states that she is unsatisfied with that doctor and wants to transfer to local pain management. He states that symptoms develop with no known trauma and unable to find relief with any prescription or over-the-counter medication. Reviewed the [place] controlled substance list shows that she currently takes Lyrica 90 tablets monthly. Rates pain as 10 over 10.   Past Medical History  Diagnosis Date  . CAD (coronary artery disease) unk  . Diabetes mellitus without complication ([place])   . Hypertension   . Depression unk  . Pancreatitis unk  . Thyroid disease   . TIA (transient ischemic attack) unk  . Anxiety   . COPD (chronic obstructive pulmonary disease) ([place])   . Asthma   . Headache   . Diabetes mellitus, type II ([place])   . MI (myocardial infarction) [facility])     Patient Active Problem List   Diagnosis Date Noted  . 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 Abnormal gait 12/15/2014  . Congestive heart failure ([place]) 11/15/2014  . Abdominal wall abscess 09/20/2014  . Muscle ache 09/16/2014  . 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 Diabetes mellitus, type 2 ([place]) 08/13/2014  . Bipolar affective disorder ([place]) 08/13/2014  . Type 2 diabetes mellitus ([place]) 08/13/2014  . Rectal prolapse 08/09/2014  . Rectal bleeding 08/09/2014  . Rectal bleed 08/09/2014  . Affective bipolar disorder ([place]) 08/05/2014  . Arteriosclerosis of coronary artery 08/05/2014  . CCF (congestive cardiac failure) ([place]) 08/05/2014  .  Chronic kidney disease 08/05/2014  . 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 Apnea, sleep 08/05/2014  . Temporary cerebral vascular dysfunction 08/05/2014  . Chronic LBP 04/29/2014  . Polypharmacy 04/29/2014  . Left leg pain 04/29/2014  . Other long term (current) drug therapy 04/29/2014  . 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 Chronic kidney disease, stage III (moderate) 12/14/2013  . Controlled diabetes mellitus type II without complication ([place]) 99991111  . Essential (primary) hypertension 12/03/2013  . Adult hypothyroidism 12/03/2013  . Controlled type 2 diabetes mellitus without complication ([place]) 99991111    Past Surgical History  Procedure Laterality Date  . Prolapse rectum surgery N/A   . Abdominal hysterectomy      Current Outpatient Rx  Name  Route  Sig  Dispense  Refill  . albuterol (PROVENTIL HFA;VENTOLIN HFA) 108 (90 BASE) MCG/ACT inhaler   Inhalation   Inhale 2 puffs into the lungs every 4 (four) hours as needed for wheezing or shortness of breath.         Marland Kitchen aspirin EC 81 MG tablet   Oral   Take 81 mg by mouth daily at 12 noon.         Marland Kitchen atorvastatin (LIPITOR) 40 MG tablet   Oral   Take 40 mg by mouth at bedtime.          Marland Kitchen azelastine (ASTELIN) 0.1 % nasal spray   Each Nare   Place 1 spray into both nostrils daily.         . baclofen (LIORESAL) 10 MG tablet   Oral   Take 1 tablet (10 mg total) by  mouth 3 (three) times daily.   90 tablet   1   . diclofenac (VOLTAREN) 75 MG EC tablet   Oral   Take 1 tablet (75 mg total) by mouth 2 (two) times daily.   60 tablet   0   . escitalopram (LEXAPRO) 20 MG tablet   Oral   Take 1 tablet (20 mg total) by mouth every morning.   30 tablet   4   . fluticasone-salmeterol (ADVAIR HFA) 230-21 MCG/ACT inhaler   Inhalation   Inhale 2 puffs into the lungs 2 (two) times daily.         Marland Kitchen glipiZIDE (GLUCOTROL XL) 10 MG 24 hr tablet   Oral   Take 10 mg by mouth daily as needed (for sugar greater than  or equal to 200.).          Marland Kitchen hydrOXYzine (VISTARIL) 50 MG capsule   Oral   Take 1 capsule (50 mg total) by mouth 3 (three) times daily as needed for anxiety.   90 capsule   4   . lamoTRIgine (LAMICTAL) 25 MG tablet      Take two tablets in the morning and tablet at bedtime for one week, the increase to two tablets twice daily.   120 tablet   4   . levocetirizine (XYZAL) 5 MG tablet   Oral   Take 5 mg by mouth at bedtime.          Marland Kitchen levothyroxine (SYNTHROID, LEVOTHROID) 200 MCG tablet   Oral   Take 200 mcg by mouth daily.          Marland Kitchen LYRICA 200 MG capsule                 Dispense as written.   . metoprolol succinate (TOPROL-XL) 25 MG 24 hr tablet   Oral   Take 12.5 mg by mouth at bedtime.         . montelukast (SINGULAIR) 10 MG tablet   Oral   Take 10 mg by mouth daily.          . Multiple Vitamin (MULTIVITAMIN WITH MINERALS) TABS tablet   Oral   Take 1 tablet by mouth daily.         . Olopatadine HCl (PATADAY) 0.2 % SOLN   Ophthalmic   Apply 1 drop to eye as needed (for dry eyes).         Marland Kitchen oxymetazoline (AFRIN) 0.05 % nasal spray   Each Nare   Place 2 sprays into both nostrils as needed (Nosebleed).   15 mL   2   . pantoprazole (PROTONIX) 40 MG tablet   Oral   Take 40 mg by mouth 2 (two) times daily.         . pregabalin (LYRICA) 200 MG capsule   Oral   Take 200 mg by mouth 3 (three) times daily.         . ranitidine (ZANTAC) 300 MG tablet   Oral   Take 1 tablet (300 mg total) by mouth 2 (two) times daily.   60 tablet   0   . ranolazine (RANEXA) 500 MG 12 hr tablet   Oral   Take 500 mg by mouth 2 (two) times daily.         . solifenacin (VESICARE) 10 MG tablet   Oral   Take 10 mg by mouth at bedtime.         . sucralfate (CARAFATE) 1 G tablet   Oral  Take 1 tablet (1 g total) by mouth 4 (four) times daily.   120 tablet   1   . tiotropium (SPIRIVA) 18 MCG inhalation capsule   Inhalation   Place 18 mcg into  inhaler and inhale at bedtime.         . topiramate (TOPAMAX) 100 MG tablet   Oral   Take 100 mg by mouth 3 (three) times daily.         Marland Kitchen torsemide (DEMADEX) 20 MG tablet   Oral   Take 20 mg by mouth daily.           Allergies Sulfa antibiotics; Azithromycin; Cephalexin; Ciprofloxacin; Flagyl; Levofloxacin; Vancomycin; and Pregabalin  Family History  Problem Relation Age of Onset  . Diabetes Mellitus II Mother   . CAD Mother   . Sleep apnea Mother   . Osteoarthritis Mother   . Osteoporosis Mother   . Anxiety disorder Mother   . Depression Mother   . Bipolar disorder Mother   . Bipolar disorder Father   . Hypertension Father   . Depression Father   . Anxiety disorder Father   . Post-traumatic stress disorder Sister     Social History Social History  Substance Use Topics  . Smoking status: Former Smoker    Types: Cigarettes    Quit date: 09/09/2005  . Smokeless tobacco: Never Used  . Alcohol Use: No    Review of Systems Constitutional: No fever/chills Eyes: No visual changes. ENT: No sore throat. Cardiovascular: Denies chest pain. Respiratory: Denies shortness of breath. Gastrointestinal: No abdominal pain.  No nausea, no vomiting.  No diarrhea.  No constipation. Genitourinary: Negative for dysuria. Musculoskeletal: Positive for severe low back pain. Skin: Negative for rash. Neurological: Negative for headaches, focal weakness or numbness.  10-point ROS otherwise negative.  ____________________________________________   PHYSICAL EXAM:  VITAL SIGNS: ED Triage Vitals  Enc Vitals Group     BP 03/03/15 0908 118/59 mmHg     Pulse Rate 03/03/15 0908 86     Resp 03/03/15 0908 18     Temp 03/03/15 0908 98.1 F (36.7 C)     Temp Source 03/03/15 0908 Oral     SpO2 03/03/15 0908 96 %     Weight 03/03/15 0912 243 lb (110.224 kg)     Height 03/03/15 0912 5\' 4"  (1.626 m)     Head Cir --      Peak Flow --      Pain Score 03/03/15 0913 10     Pain Loc  --      Pain Edu? --      Excl. in [place]? --     Constitutional: Alert and oriented. Well appearing and in moderately acute distress. Moans and groans with every movement. Eyes: Conjunctivae are normal. PERRL. EOMI. Head: Atraumatic. Nose: No congestion/rhinnorhea. Mouth/Throat: Mucous membranes are moist.  Oropharynx non-erythematous. Neck: No stridor.   Cardiovascular: Normal rate, regular rhythm. Grossly normal heart sounds.  Good peripheral circulation. Respiratory: Normal respiratory effort.  No retractions. Lungs CTAB. Gastrointestinal: Soft and nontender. No distention. No abdominal bruits. No CVA tenderness. Musculoskeletal: Point tenderness lumbar spine. Straight leg raise positive bilaterally at 10. Neurologic:  Normal speech and language. No gross focal neurologic deficits are appreciated. No gait instability. Skin:  Skin is warm, dry and intact. No rash noted. Psychiatric: Mood and affect are normal. Speech and behavior are normal.  ____________________________________________   LABS (all labs ordered are listed, but only abnormal results are displayed)  Labs Reviewed -  No data to display ____________________________________________  RADIOLOGY  FINDINGS: No malalignment is identified. There is no significant canal narrowing. No fractures are seen. Small posterior disc bulges are seen at L4-5 and L5-S1. Degenerative changes are seen in the L4-5 facets. No other significant degenerative changes are identified.  There is mild narrowing of the bilateral L5-S1 neural foramina.  IMPRESSION: 1. Lower lumbar facet degenerative changes primarily at L4. Disc bulges at L4-5 and L5-S1. Mild narrowing of the bilateral L5-S1 neural foramina. ____________________________________________   PROCEDURES  Procedure(s) performed: None  Critical Care performed: No  ____________________________________________   INITIAL IMPRESSION / ASSESSMENT AND PLAN / ED  COURSE  Pertinent labs & imaging results that were available during my care of the patient were reviewed by me and considered in my medical decision making (see chart for details).  Recurrent and ongoing low back pain. Rx given for Voltaren-XR 75 mg twice a day, baclofen 10 mg 3 times a day. Encourage patient to keep her pain management appointments to continue her Lyrica for pain and to reestablish PCP as needed. Referral was given to pain management locally. Patient discharged to home and voices no other emergency medical complaints at this time. ____________________________________________   FINAL CLINICAL IMPRESSION(S) / ED DIAGNOSES  Final diagnoses:  Low back pain without sciatica, unspecified back pain laterality      Arlyss Repress, PA-C 03/03/15 [place] Yao, MD 03/03/15 1521

## 2015-03-14 ENCOUNTER — Telehealth: Payer: Self-pay | Admitting: Psychiatry

## 2015-03-15 NOTE — Telephone Encounter (Signed)
pt was told that letter was reprinted and asked is fhe wanted it mailed to her or if she wanted to pick it up.  pt  states that she would like the letter mailed.

## 2015-03-18 ENCOUNTER — Ambulatory Visit: Payer: Medicare Other | Admitting: Psychiatry

## 2015-03-21 ENCOUNTER — Encounter: Payer: Self-pay | Admitting: Physical Therapy

## 2015-03-21 ENCOUNTER — Emergency Department
Admission: EM | Admit: 2015-03-21 | Discharge: 2015-03-21 | Disposition: A | Discharge status: Home / Self Care | Payer: Medicare HMO | Attending: Emergency Medicine | Admitting: Emergency Medicine

## 2015-03-21 ENCOUNTER — Ambulatory Visit: Payer: Medicare HMO | Attending: Gastroenterology | Admitting: Physical Therapy

## 2015-03-21 VITALS — BP 95/59

## 2015-03-21 DIAGNOSIS — K625 Hemorrhage of anus and rectum: Secondary | ICD-10-CM | POA: Diagnosis present

## 2015-03-21 DIAGNOSIS — Z79899 Other long term (current) drug therapy: Secondary | ICD-10-CM | POA: Diagnosis not present

## 2015-03-21 DIAGNOSIS — Z87891 Personal history of nicotine dependence: Secondary | ICD-10-CM | POA: Insufficient documentation

## 2015-03-21 DIAGNOSIS — K644 Residual hemorrhoidal skin tags: Secondary | ICD-10-CM | POA: Insufficient documentation

## 2015-03-21 DIAGNOSIS — E119 Type 2 diabetes mellitus without complications: Secondary | ICD-10-CM | POA: Insufficient documentation

## 2015-03-21 DIAGNOSIS — N183 Chronic kidney disease, stage 3 (moderate): Secondary | ICD-10-CM | POA: Diagnosis not present

## 2015-03-21 DIAGNOSIS — Z792 Long term (current) use of antibiotics: Secondary | ICD-10-CM | POA: Diagnosis not present

## 2015-03-21 DIAGNOSIS — Z7951 Long term (current) use of inhaled steroids: Secondary | ICD-10-CM | POA: Insufficient documentation

## 2015-03-21 DIAGNOSIS — K6289 Other specified diseases of anus and rectum: Secondary | ICD-10-CM

## 2015-03-21 DIAGNOSIS — N8184 Pelvic muscle wasting: Secondary | ICD-10-CM | POA: Insufficient documentation

## 2015-03-21 DIAGNOSIS — M6289 Other specified disorders of muscle: Secondary | ICD-10-CM

## 2015-03-21 DIAGNOSIS — R279 Unspecified lack of coordination: Secondary | ICD-10-CM | POA: Insufficient documentation

## 2015-03-21 DIAGNOSIS — Z7982 Long term (current) use of aspirin: Secondary | ICD-10-CM | POA: Insufficient documentation

## 2015-03-21 DIAGNOSIS — Z7984 Long term (current) use of oral hypoglycemic drugs: Secondary | ICD-10-CM | POA: Insufficient documentation

## 2015-03-21 DIAGNOSIS — I129 Hypertensive chronic kidney disease with stage 1 through stage 4 chronic kidney disease, or unspecified chronic kidney disease: Secondary | ICD-10-CM | POA: Diagnosis not present

## 2015-03-21 LAB — BASIC METABOLIC PANEL
ANION GAP: 7 (ref 5–15)
BUN: 19 mg/dL (ref 6–20)
CHLORIDE: 113 mmol/L — AB (ref 101–111)
CO2: 22 mmol/L (ref 22–32)
Calcium: 9 mg/dL (ref 8.9–10.3)
Creatinine, Ser: 0.98 mg/dL (ref 0.44–1.00)
GFR calc non Af Amer: >60 mL/min (ref >60)
GLUCOSE: 126 mg/dL — AB (ref 65–99)
POTASSIUM: 3.8 mmol/L (ref 3.5–5.1)
Sodium: 142 mmol/L (ref 135–145)

## 2015-03-21 LAB — CBC WITH DIFFERENTIAL/PLATELET
BASOS ABS: 0 10*3/uL (ref 0–0.1)
BASOS PCT: 0 %
Eosinophils Absolute: 0.3 10*3/uL (ref 0–0.7)
Eosinophils Relative: 5 %
HEMATOCRIT: 31.6 % — AB (ref 35.0–47.0)
HEMOGLOBIN: 10.3 g/dL — AB (ref 12.0–16.0)
LYMPHS PCT: 28 %
Lymphs Abs: 1.9 10*3/uL (ref 1.0–3.6)
MCH: 27.6 pg (ref 26.0–34.0)
MCHC: 32.5 g/dL (ref 32.0–36.0)
MCV: 84.9 fL (ref 80.0–100.0)
MONOS PCT: 5 %
Monocytes Absolute: 0.4 10*3/uL (ref 0.2–0.9)
NEUTROS ABS: 4.3 10*3/uL (ref 1.4–6.5)
NEUTROS PCT: 62 %
Platelets: 229 10*3/uL (ref 150–440)
RBC: 3.72 MIL/uL — ABNORMAL LOW (ref 3.80–5.20)
RDW: 17.5 % — ABNORMAL HIGH (ref 11.5–14.5)
WBC: 7 10*3/uL (ref 3.6–11.0)

## 2015-03-21 MED ORDER — PRAMOXINE HCL 1 % RE FOAM: 1.0000 "application " | Freq: Three times a day (TID) | RECTAL | Status: DC | PRN

## 2015-03-21 NOTE — ED Notes (Signed)
Pt was seen at PT today and sent to the ED for hypotension and rectal bleeding. Pt had a systolic in the [id number]. Pt normal BP one teens systolic. Pt reports rectal bleeding since colonoscopy on 1/20. Pt also reports weakness.

## 2015-03-21 NOTE — Discharge Instructions (Signed)
Gastrointestinal Bleeding Gastrointestinal (GI) bleeding means there is bleeding somewhere along the digestive tract, between the mouth and anus. CAUSES  There are many different problems that can cause GI bleeding. Possible causes include:  Esophagitis. This is inflammation, irritation, or swelling of the esophagus.  Hemorrhoids.These are veins that are full of blood (engorged) in the rectum. They cause pain, inflammation, and may bleed.  Anal fissures.These are areas of painful tearing which may bleed. They are often caused by passing hard stool.  Diverticulosis.These are pouches that form on the colon over time, with age, and may bleed significantly.  Diverticulitis.This is inflammation in areas with diverticulosis. It can cause pain, fever, and bloody stools, although bleeding is rare.  Polyps and cancer. Colon cancer often starts out as precancerous polyps.  Gastritis and ulcers.Bleeding from the upper gastrointestinal tract (near the stomach) may travel through the intestines and produce black, sometimes tarry, often bad smelling stools. In certain cases, if the bleeding is fast enough, the stools may not be black, but red. This condition may be life-threatening. SYMPTOMS   Vomiting bright red blood or material that looks like coffee grounds.  Bloody, black, or tarry stools. DIAGNOSIS  Your caregiver may diagnose your condition by taking your history and performing a physical exam. More tests may be needed, including:  X-rays and other imaging tests.  Esophagogastroduodenoscopy (EGD). This test uses a flexible, lighted tube to look at your esophagus, stomach, and small intestine.  Colonoscopy. This test uses a flexible, lighted tube to look at your colon. TREATMENT  Treatment depends on the cause of your bleeding.   For bleeding from the esophagus, stomach, small intestine, or colon, the caregiver doing your EGD or colonoscopy may be able to stop the bleeding as part of  the procedure.  Inflammation or infection of the colon can be treated with medicines.  Many rectal problems can be treated with creams, suppositories, or warm baths.  Surgery is sometimes needed.  Blood transfusions are sometimes needed if you have lost a lot of blood. If bleeding is slow, you may be allowed to go home. If there is a lot of bleeding, you will need to stay in the hospital for observation. HOME CARE INSTRUCTIONS   Take any medicines exactly as prescribed.  Keep your stools soft by eating foods that are high in fiber. These foods include whole grains, legumes, fruits, and vegetables. Prunes (1 to 3 a day) work well for many people.  Drink enough fluids to keep your urine clear or pale yellow. SEEK IMMEDIATE MEDICAL CARE IF:   Your bleeding increases.  You feel lightheaded, weak, or you faint.  You have severe cramps in your back or abdomen.  You pass large blood clots in your stool.  Your problems are getting worse. MAKE SURE YOU:   Understand these instructions.  Will watch your condition.  Will get help right away if you are not doing well or get worse.   This information is not intended to replace advice given to you by your health care provider. Make sure you discuss any questions you have with your health care provider.   Document Released: 01/27/2000 Document Revised: 01/16/2012 Document Reviewed: 07/19/2014 Elsevier Interactive Patient Education 2016 [employer].  Hemorrhoids Hemorrhoids are swollen veins around the rectum or anus. There are two types of hemorrhoids:   Internal hemorrhoids. These occur in the veins just inside the rectum. They may poke through to the outside and become irritated and painful.  External hemorrhoids. These occur  in the veins outside the anus and can be felt as a painful swelling or hard lump near the anus. CAUSES  Pregnancy.   Obesity.   Constipation or diarrhea.   Straining to have a bowel movement.    Sitting for long periods on the toilet.  Heavy lifting or other activity that caused you to strain.  Anal intercourse. SYMPTOMS   Pain.   Anal itching or irritation.   Rectal bleeding.   Fecal leakage.   Anal swelling.   One or more lumps around the anus.  DIAGNOSIS  Your caregiver may be able to diagnose hemorrhoids by visual examination. Other examinations or tests that may be performed include:   Examination of the rectal area with a gloved hand (digital rectal exam).   Examination of anal canal using a small tube (scope).   A blood test if you have lost a significant amount of blood.  A test to look inside the colon (sigmoidoscopy or colonoscopy). TREATMENT Most hemorrhoids can be treated at home. However, if symptoms do not seem to be getting better or if you have a lot of rectal bleeding, your caregiver may perform a procedure to help make the hemorrhoids get smaller or remove them completely. Possible treatments include:   Placing a rubber band at the base of the hemorrhoid to cut off the circulation (rubber band ligation).   Injecting a chemical to shrink the hemorrhoid (sclerotherapy).   Using a tool to burn the hemorrhoid (infrared light therapy).   Surgically removing the hemorrhoid (hemorrhoidectomy).   Stapling the hemorrhoid to block blood flow to the tissue (hemorrhoid stapling).  HOME CARE INSTRUCTIONS   Eat foods with fiber, such as whole grains, beans, nuts, fruits, and vegetables. Ask your doctor about taking products with added fiber in them (fibersupplements).  Increase fluid intake. Drink enough water and fluids to keep your urine clear or pale yellow.   Exercise regularly.   Go to the bathroom when you have the urge to have a bowel movement. Do not wait.   Avoid straining to have bowel movements.   Keep the anal area dry and clean. Use wet toilet paper or moist towelettes after a bowel movement.   Medicated creams  and suppositories may be used or applied as directed.   Only take over-the-counter or prescription medicines as directed by your caregiver.   Take warm sitz baths for 15-20 minutes, 3-4 times a day to ease pain and discomfort.   Place ice packs on the hemorrhoids if they are tender and swollen. Using ice packs between sitz baths may be helpful.   Put ice in a plastic bag.   Place a towel between your skin and the bag.   Leave the ice on for 15-20 minutes, 3-4 times a day.   Do not use a donut-shaped pillow or sit on the toilet for long periods. This increases blood pooling and pain.  SEEK MEDICAL CARE IF:  You have increasing pain and swelling that is not controlled by treatment or medicine.  You have uncontrolled bleeding.  You have difficulty or you are unable to have a bowel movement.  You have pain or inflammation outside the area of the hemorrhoids. MAKE SURE YOU:  Understand these instructions.  Will watch your condition.  Will get help right away if you are not doing well or get worse.   This information is not intended to replace advice given to you by your health care provider. Make sure you discuss any questions you  have with your health care provider.   Document Released: 01/27/2000 Document Revised: 01/16/2012 Document Reviewed: 12/04/2011 Elsevier Interactive Patient Education [employer].

## 2015-03-21 NOTE — ED Provider Notes (Signed)
[facility] Emergency Department Provider Note  ____________________________________________  Time seen: 1:55 PM  I have reviewed the triage vital signs and the nursing notes.   HISTORY  Chief Complaint Rectal Bleeding    HPI Marie Clarke is a 45 y.o. female is brought to the ED today complaining of rectal bleeding. There is a port from physical therapy of hypotension as well.  Patient denies chest pain shortness of breath dizziness syncope. She reports a history of rectal prolapse that was surgically repaired by general surgery at [facility]. She has follow-up with them this month and is scheduled for repeat procedure on February 22. The bleeding has been for the past 3 weeks that started after a colonoscopy, with bright red blood after bowel movements. Specifically there is red blood on the toilet paper when she wipes but not a lot of blood in the water itself.     Past Medical History  Diagnosis Date  . CAD (coronary artery disease) unk  . Diabetes mellitus without complication ([place])   . Hypertension   . Depression unk  . Pancreatitis unk  . Thyroid disease   . TIA (transient ischemic attack) unk  . Anxiety   . COPD (chronic obstructive pulmonary disease) ([place])   . Asthma   . Headache   . Diabetes mellitus, type II ([place])   . MI (myocardial infarction) ([place])   . Sleep apnea     pt reported on 2/6/7 she currently is not using CPAP     Patient Active Problem List   Diagnosis Date Noted  . 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 Abnormal gait 12/15/2014  . Congestive heart failure ([place]) 11/15/2014  . Abdominal wall abscess 09/20/2014  . Muscle ache 09/16/2014  . 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 Diabetes mellitus, type 2 ([place]) 08/13/2014  . Bipolar affective disorder ([place]) 08/13/2014  . Type 2 diabetes mellitus ([place]) 08/13/2014  . Rectal prolapse 08/09/2014  . Rectal bleeding 08/09/2014  . Rectal bleed 08/09/2014  . Affective bipolar disorder ([place])  08/05/2014  . Arteriosclerosis of coronary artery 08/05/2014  . CCF (congestive cardiac failure) ([place]) 08/05/2014  . Chronic kidney disease 08/05/2014  . 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 Apnea, sleep 08/05/2014  . Temporary cerebral vascular dysfunction 08/05/2014  . Chronic LBP 04/29/2014  . Polypharmacy 04/29/2014  . Left leg pain 04/29/2014  . Other long term (current) drug therapy 04/29/2014  . 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 Chronic kidney disease, stage III (moderate) 12/14/2013  . Controlled diabetes mellitus type II without complication ([place]) 99991111  . Essential (primary) hypertension 12/03/2013  . Adult hypothyroidism 12/03/2013  . Controlled type 2 diabetes mellitus without complication ([place]) 99991111     Past Surgical History  Procedure Laterality Date  . Prolapse rectum surgery N/A   . Abdominal hysterectomy       Current Outpatient Rx  Name  Route  Sig  Dispense  Refill  . albuterol (PROVENTIL HFA;VENTOLIN HFA) 108 (90 BASE) MCG/ACT inhaler   Inhalation   Inhale 2 puffs into the lungs every 4 (four) hours as needed for wheezing or shortness of breath.         Marland Kitchen aspirin EC 81 MG tablet   Oral   Take 81 mg by mouth daily at 12 noon.         Marland Kitchen atorvastatin (LIPITOR) 40 MG tablet   Oral   Take 40 mg by mouth at bedtime.          Marland Kitchen azelastine (ASTELIN) 0.1 %  nasal spray   Each Nare   Place 1 spray into both nostrils daily.         . baclofen (LIORESAL) 10 MG tablet   Oral   Take 1 tablet (10 mg total) by mouth 3 (three) times daily.   90 tablet   1   . diclofenac (VOLTAREN) 75 MG EC tablet   Oral   Take 1 tablet (75 mg total) by mouth 2 (two) times daily.   60 tablet   0   . escitalopram (LEXAPRO) 20 MG tablet   Oral   Take 1 tablet (20 mg total) by mouth every morning.   30 tablet   4   . fluticasone-salmeterol (ADVAIR HFA) 230-21 MCG/ACT inhaler   Inhalation   Inhale 2 puffs into the lungs 2 (two) times daily.          Marland Kitchen glipiZIDE (GLUCOTROL XL) 10 MG 24 hr tablet   Oral   Take 10 mg by mouth daily as needed (for sugar greater than or equal to 200.).          Marland Kitchen hydrOXYzine (VISTARIL) 50 MG capsule   Oral   Take 1 capsule (50 mg total) by mouth 3 (three) times daily as needed for anxiety.   90 capsule   4   . lamoTRIgine (LAMICTAL) 25 MG tablet      Take two tablets in the morning and tablet at bedtime for one week, the increase to two tablets twice daily.   120 tablet   4   . levocetirizine (XYZAL) 5 MG tablet   Oral   Take 5 mg by mouth at bedtime.          Marland Kitchen levothyroxine (SYNTHROID, LEVOTHROID) 200 MCG tablet   Oral   Take 200 mcg by mouth daily.          Marland Kitchen LYRICA 200 MG capsule                 Dispense as written.   . metoprolol succinate (TOPROL-XL) 25 MG 24 hr tablet   Oral   Take 12.5 mg by mouth at bedtime.         . montelukast (SINGULAIR) 10 MG tablet   Oral   Take 10 mg by mouth daily.          . Multiple Vitamin (MULTIVITAMIN WITH MINERALS) TABS tablet   Oral   Take 1 tablet by mouth daily.         . Olopatadine HCl (PATADAY) 0.2 % SOLN   Ophthalmic   Apply 1 drop to eye as needed (for dry eyes).         Marland Kitchen oxymetazoline (AFRIN) 0.05 % nasal spray   Each Nare   Place 2 sprays into both nostrils as needed (Nosebleed).   15 mL   2   . pantoprazole (PROTONIX) 40 MG tablet   Oral   Take 40 mg by mouth 2 (two) times daily.         . pramoxine (PROCTOFOAM) 1 % foam   Rectal   Place 1 application rectally 3 (three) times daily as needed for irritation or hemorrhoids.   15 g   0   . pregabalin (LYRICA) 200 MG capsule   Oral   Take 200 mg by mouth 3 (three) times daily.         . ranitidine (ZANTAC) 300 MG tablet   Oral   Take 1 tablet (300 mg total) by mouth 2 (two) times daily.  60 tablet   0   . ranolazine (RANEXA) 500 MG 12 hr tablet   Oral   Take 500 mg by mouth 2 (two) times daily.         . solifenacin (VESICARE) 10  MG tablet   Oral   Take 10 mg by mouth at bedtime.         . sucralfate (CARAFATE) 1 G tablet   Oral   Take 1 tablet (1 g total) by mouth 4 (four) times daily.   120 tablet   1   . tiotropium (SPIRIVA) 18 MCG inhalation capsule   Inhalation   Place 18 mcg into inhaler and inhale at bedtime.         . topiramate (TOPAMAX) 100 MG tablet   Oral   Take 100 mg by mouth 3 (three) times daily.         Marland Kitchen torsemide (DEMADEX) 20 MG tablet   Oral   Take 20 mg by mouth daily.            Allergies Sulfa antibiotics; Azithromycin; Cephalexin; Ciprofloxacin; Flagyl; Levofloxacin; Vancomycin; and Pregabalin   Family History  Problem Relation Age of Onset  . Diabetes Mellitus II Mother   . CAD Mother   . Sleep apnea Mother   . Osteoarthritis Mother   . Osteoporosis Mother   . Anxiety disorder Mother   . Depression Mother   . Bipolar disorder Mother   . Bipolar disorder Father   . Hypertension Father   . Depression Father   . Anxiety disorder Father   . Post-traumatic stress disorder Sister     Social History Social History  Substance Use Topics  . Smoking status: Former Smoker    Types: Cigarettes    Quit date: 09/09/2005  . Smokeless tobacco: Never Used  . Alcohol Use: No    Review of Systems  Constitutional:   No fever or chills. No weight changes Eyes:   No blurry vision or double vision.  ENT:   No sore throat. Cardiovascular:   No chest pain. Respiratory:   No dyspnea or cough. Gastrointestinal:   Negative for abdominal pain, vomiting and diarrhea.  Positive rectal bleeding, positive rectal pain. Genitourinary:   Negative for dysuria, urinary retention, bloody urine, or difficulty urinating. Musculoskeletal:   Negative for back pain. No joint swelling or pain. Skin:   Negative for rash. Neurological:   Negative for headaches, focal weakness or numbness. Psychiatric:  No anxiety or depression.   Endocrine:  No hot/cold intolerance, changes in energy,  or sleep difficulty.  10-point ROS otherwise negative.  ____________________________________________   PHYSICAL EXAM:  VITAL SIGNS: ED Triage Vitals  Enc Vitals Group     BP 03/21/15 1101 106/63 mmHg     Pulse Rate 03/21/15 1101 103     Resp 03/21/15 1101 18     Temp 03/21/15 1101 97.9 F (36.6 C)     Temp src --      SpO2 03/21/15 1101 95 %     Weight 03/21/15 1101 235 lb (106.595 kg)     Height 03/21/15 1101 5\' 4"  (1.626 m)     Head Cir --      Peak Flow --      Pain Score --      Pain Loc --      Pain Edu? --      Excl. in [place]? --     Vital signs reviewed, nursing assessments reviewed.   Constitutional:   Alert  and oriented. Well appearing and in no distress. Eyes:   No scleral icterus. No conjunctival pallor. PERRL. EOMI ENT   Head:   Normocephalic and atraumatic.   Nose:   No congestion/rhinnorhea. No septal hematoma   Mouth/Throat:   MMM, no pharyngeal erythema. No peritonsillar mass. No uvula shift.   Neck:   No stridor. No SubQ emphysema. No meningismus. Hematological/Lymphatic/Immunilogical:   No cervical lymphadenopathy. Cardiovascular:   RRR. Normal and symmetric distal pulses are present in all extremities. No murmurs, rubs, or gallops. Respiratory:   Normal respiratory effort without tachypnea nor retractions. Breath sounds are clear and equal bilaterally. No wheezes/rales/rhonchi. Gastrointestinal:   Soft and nontender. No distention. There is no CVA tenderness.  No rebound, rigidity, or guarding. Rectal exam performed with nurse Anda Kraft the bedside, shows small amount of external hemorrhoids on external exam. Internally there is some bogginess and external hemorrhoids. No melanotic stool, no gross red blood, secretions are faintly Hemoccult positive. Genitourinary:   deferred Musculoskeletal:   Nontender with normal range of motion in all extremities. No joint effusions.  No lower extremity tenderness.  No edema. Neurologic:   Normal speech and  language.  CN 2-10 normal. Motor grossly intact. No pronator drift.  Normal gait. No gross focal neurologic deficits are appreciated.  Skin:    Skin is warm, dry and intact. No rash noted.  No petechiae, purpura, or bullae. Psychiatric:   Mood and affect are normal. Speech and behavior are normal. Patient exhibits appropriate insight and judgment.  ____________________________________________    LABS (pertinent positives/negatives) (all labs ordered are listed, but only abnormal results are displayed) Labs Reviewed  CBC WITH DIFFERENTIAL/PLATELET - Abnormal; Notable for the following:    RBC 3.72 (*)    Hemoglobin 10.3 (*)    HCT 31.6 (*)    RDW 17.5 (*)    All other components within normal limits  BASIC METABOLIC PANEL - Abnormal; Notable for the following:    Chloride 113 (*)    Glucose, Bld 126 (*)    All other components within normal limits   ____________________________________________   EKG    ____________________________________________    RADIOLOGY    ____________________________________________   PROCEDURES   ____________________________________________   INITIAL IMPRESSION / ASSESSMENT AND PLAN / ED COURSE  Pertinent labs & imaging results that were available during my care of the patient were reviewed by me and considered in my medical decision making (see chart for details).  Patient presents with rectal pain and complained of rectal bleeding which is essentially chronic. She is closely being followed by GI and general surgery at [facility]. Lowest blood pressure here is 95/59 which is adequate, and subsequent blood pressures stable and normal. On my exam heart rate is in the 70s. Have low suspicion for hypovolemia or shock or significant volume loss or hemorrhage.Considering the patient's symptoms, medical history, and physical examination today, I have low suspicion for cholecystitis or biliary pathology, pancreatitis, perforation or bowel obstruction,  hernia, intra-abdominal abscess, AAA or dissection, volvulus or intussusception, or appendicitis.  She is very well-appearing, no acute distress, exam is reassuring as are her labs which are stable. Discharged to follow up closely with her doctors. she does not appear to warrant inpatient treatment at this time. She has witch hazel pads at home, will add Proctofoam for symptom relief.     ____________________________________________   FINAL CLINICAL IMPRESSION(S) / ED DIAGNOSES  Final diagnoses:  Rectal pain  External hemorrhoid      Carrie Mew, MD  03/21/15 1458 

## 2015-03-22 ENCOUNTER — Telehealth: Payer: Self-pay | Admitting: Physical Therapy

## 2015-03-22 NOTE — Telephone Encounter (Signed)
PT returned pt's call. Pt reported she was seen at the ED yesterday and ER MD told her she had hemorrhoids and she was discharged home. Pt reported she had a fall before due to her ankle "giving out" on her and feeling weak. Pt reported she fell on her face and her incision between her belly button and vaginal area "busted open". Currently, she has bandaged it and the bleeding as slowed down. PT explained she needed to seek medical attention in order avoid infections and notify her providers [place]o and [place]) re: wound and weakness.  Pt voiced understanding. PT called [facility] after hours line and waited 45 min until a physician on-call (Dr. Manuella Ghazi)  was available. MD was provided pt's number and update. MD reported they will  f/u w/ pt and will leave a msg w/ Dr. Erline Levine RN.

## 2015-03-22 NOTE — Therapy (Signed)
[place] MAIN [facility] SERVICES [address] [place], [place], [address] Phone: [phone]   Fax:  [phone]  Patient Details  Name: Marie Clarke MRN: [id number] Date of Birth: 1970-10-19 Referring Provider:  Kathreen Cosier, MD  Encounter Date: 03/21/2015   Jerl Mina 03/22/2015, 11:35 AM  [place] [address] [place], [place], [address] Phone: [phone]   Fax:  [phone]   Pt was referred to [facility] for fecal incontinence. Pt arrived with a need to use the bathroom before the evaluation. Pt notified PT re: the blood that occurred with her bowel movements. PT was able to see blood in the toilet as pt flushed.   Subjective was taken:  Pt reported she has been having increased bleeding with bowel movements since Jan 20th. Pt states she has been feeling weaker and weaker but denied blacking out nor falls. Pt reported nightsweats, and chills the past two days. Pt reported she will be undergoing rectal prolapse surgery on 04/07/15 which will be her 3rd time in the last two years. Pt  reports diarrhea for the past week. Pt states pain 10/10 at lower abdomen and rectum. Her usual pain is a 5-6/10. Pt states it is hurting her to sit right now.    Assessment: BP taken: 95/50mm Hg.   Plan: Due to pt's low BP and recent report of night sweats, chills and increased weakness,PT deferred evaluation today. Pt was escorted to ED by PT w/ hospital volunteer pushing pt in [facility]. Pt was left with ED staff around 10:45am 03/21/15.  PT called [facility] and left a message for referring MD re: pt's status and to withhold PT until pt has been  medically cleared.   Jerl Mina, ,PT, DPT, E-RYT [phone]

## 2015-03-28 ENCOUNTER — Encounter: Payer: Self-pay | Admitting: Pain Medicine

## 2015-03-28 ENCOUNTER — Other Ambulatory Visit: Payer: Self-pay | Admitting: Pain Medicine

## 2015-03-28 ENCOUNTER — Ambulatory Visit: Payer: Medicare HMO | Attending: Pain Medicine | Admitting: Pain Medicine

## 2015-03-28 VITALS — BP 113/76 | HR 77 | Temp 98.2°F | Resp 16 | Ht 64.0 in | Wt 230.0 lb

## 2015-03-28 DIAGNOSIS — Z9189 Other specified personal risk factors, not elsewhere classified: Secondary | ICD-10-CM

## 2015-03-28 DIAGNOSIS — F319 Bipolar disorder, unspecified: Secondary | ICD-10-CM | POA: Insufficient documentation

## 2015-03-28 DIAGNOSIS — R937 Abnormal findings on diagnostic imaging of other parts of musculoskeletal system: Secondary | ICD-10-CM

## 2015-03-28 DIAGNOSIS — I252 Old myocardial infarction: Secondary | ICD-10-CM | POA: Diagnosis not present

## 2015-03-28 DIAGNOSIS — K589 Irritable bowel syndrome without diarrhea: Secondary | ICD-10-CM | POA: Diagnosis not present

## 2015-03-28 DIAGNOSIS — E1122 Type 2 diabetes mellitus with diabetic chronic kidney disease: Secondary | ICD-10-CM | POA: Diagnosis not present

## 2015-03-28 DIAGNOSIS — M79605 Pain in left leg: Secondary | ICD-10-CM | POA: Diagnosis not present

## 2015-03-28 DIAGNOSIS — G90529 Complex regional pain syndrome I of unspecified lower limb: Secondary | ICD-10-CM | POA: Diagnosis not present

## 2015-03-28 DIAGNOSIS — G4730 Sleep apnea, unspecified: Secondary | ICD-10-CM | POA: Insufficient documentation

## 2015-03-28 DIAGNOSIS — T8130XA Disruption of wound, unspecified, initial encounter: Secondary | ICD-10-CM | POA: Diagnosis not present

## 2015-03-28 DIAGNOSIS — M545 Low back pain, unspecified: Secondary | ICD-10-CM

## 2015-03-28 DIAGNOSIS — E785 Hyperlipidemia, unspecified: Secondary | ICD-10-CM | POA: Diagnosis not present

## 2015-03-28 DIAGNOSIS — M25572 Pain in left ankle and joints of left foot: Secondary | ICD-10-CM

## 2015-03-28 DIAGNOSIS — I10 Essential (primary) hypertension: Secondary | ICD-10-CM | POA: Diagnosis not present

## 2015-03-28 DIAGNOSIS — J45909 Unspecified asthma, uncomplicated: Secondary | ICD-10-CM | POA: Insufficient documentation

## 2015-03-28 DIAGNOSIS — G90522 Complex regional pain syndrome I of left lower limb: Secondary | ICD-10-CM | POA: Diagnosis not present

## 2015-03-28 DIAGNOSIS — I509 Heart failure, unspecified: Secondary | ICD-10-CM | POA: Diagnosis not present

## 2015-03-28 DIAGNOSIS — Z915 Personal history of self-harm: Secondary | ICD-10-CM

## 2015-03-28 DIAGNOSIS — F17210 Nicotine dependence, cigarettes, uncomplicated: Secondary | ICD-10-CM | POA: Diagnosis not present

## 2015-03-28 DIAGNOSIS — I129 Hypertensive chronic kidney disease with stage 1 through stage 4 chronic kidney disease, or unspecified chronic kidney disease: Secondary | ICD-10-CM | POA: Insufficient documentation

## 2015-03-28 DIAGNOSIS — T81321A Disruption or dehiscence of closure of internal operation (surgical) wound of abdominal wall muscle or fascia, initial encounter: Secondary | ICD-10-CM | POA: Insufficient documentation

## 2015-03-28 DIAGNOSIS — I2510 Atherosclerotic heart disease of native coronary artery without angina pectoris: Secondary | ICD-10-CM | POA: Insufficient documentation

## 2015-03-28 DIAGNOSIS — M549 Dorsalgia, unspecified: Secondary | ICD-10-CM | POA: Diagnosis present

## 2015-03-28 DIAGNOSIS — Z0189 Encounter for other specified special examinations: Secondary | ICD-10-CM | POA: Insufficient documentation

## 2015-03-28 DIAGNOSIS — E039 Hypothyroidism, unspecified: Secondary | ICD-10-CM | POA: Insufficient documentation

## 2015-03-28 DIAGNOSIS — Z9151 Personal history of suicidal behavior: Secondary | ICD-10-CM

## 2015-03-28 DIAGNOSIS — Z79899 Other long term (current) drug therapy: Secondary | ICD-10-CM

## 2015-03-28 DIAGNOSIS — M791 Myalgia: Secondary | ICD-10-CM

## 2015-03-28 DIAGNOSIS — K219 Gastro-esophageal reflux disease without esophagitis: Secondary | ICD-10-CM | POA: Insufficient documentation

## 2015-03-28 DIAGNOSIS — X58XXXA Exposure to other specified factors, initial encounter: Secondary | ICD-10-CM | POA: Insufficient documentation

## 2015-03-28 DIAGNOSIS — M7918 Myalgia, other site: Secondary | ICD-10-CM

## 2015-03-28 DIAGNOSIS — M47896 Other spondylosis, lumbar region: Secondary | ICD-10-CM

## 2015-03-28 DIAGNOSIS — G8929 Other chronic pain: Secondary | ICD-10-CM | POA: Insufficient documentation

## 2015-03-28 DIAGNOSIS — M48061 Spinal stenosis, lumbar region without neurogenic claudication: Secondary | ICD-10-CM

## 2015-03-28 DIAGNOSIS — M792 Neuralgia and neuritis, unspecified: Secondary | ICD-10-CM

## 2015-03-28 DIAGNOSIS — M47816 Spondylosis without myelopathy or radiculopathy, lumbar region: Secondary | ICD-10-CM

## 2015-03-28 DIAGNOSIS — M4806 Spinal stenosis, lumbar region: Secondary | ICD-10-CM | POA: Diagnosis not present

## 2015-03-28 NOTE — Progress Notes (Signed)
Patient's Name: Marie Clarke MRN: [id number] DOB: 14-Jan-1971 DOS: 03/28/2015  Primary Reason(s) for Visit: Initial Patient Evaluation CC: Back Pain   HPI  Marie Clarke is a 45 y.o. year old, female patient, who comes today for an initial evaluation. She has Affective bipolar disorder (Marie Clarke); Arteriosclerosis of coronary artery; CCF (congestive cardiac failure) (Marie Clarke); Chronic kidney disease, stage III (moderate); Chronic kidney disease; Controlled diabetes mellitus type II without complication (Marie Clarke); Essential (primary) hypertension; Polypharmacy; Adult hypothyroidism; Detrusor muscle hypertonia; Algodystrophic syndrome; Apnea, sleep; Temporary cerebral vascular dysfunction; Rectal prolapse; Rectal bleeding; Rectal bleed; Detrusor dyssynergia; Diabetes mellitus, type 2 (Marie Clarke); Abdominal wall abscess; Bipolar affective disorder (Marie Clarke); Congestive heart failure (Marie Clarke); Controlled type 2 diabetes mellitus without complication (Marie Clarke); Other long term (current) drug therapy; Type 2 diabetes mellitus (Marie Clarke); Abnormal gait; Neurosis, posttraumatic; Chronic pain; Chronic low back pain (Location of Primary Source of Pain) (Bilateral) (L>R); Chronic lower extremity pain (Left); Abdominal wound dehiscence; Encounter for pain management planning; Morbid obesity (Marie Clarke); Abnormal CT scan, lumbar spine; Lumbar facet hypertrophy; Lumbar facet syndrome (Location of Primary Source of Pain) (Bilateral) (L>R); Lumbar foraminal stenosis (Bilateral) (L5-S1); Chronic ankle pain (Left); CRPS (complex regional pain syndrome) type I of lower limb (left ankle); Neurogenic pain; Neuropathic pain; Myofascial pain; and History of suicide attempt on her problem list.. Her primarily concern today is the Back Pain   The patient comes in today clinics today for the first time due to a primary pain in the lower back. This low back pain is bilateral with the left side being worst on the right. In addition, the patient has left lower extremity pain as her  secondary pain. This pain goes all the way down to her ankle and the ankle gives out frequently. The patient has a history of a CRPS of her left ankle diagnosed in [place] in 2005. She describes that the ankle symptoms as consisting of swelling, color changes words primarily bluish, and being cold to touch. She also refers having numbness of her foot. I asked her if she had a nerve conduction test done and she was not sure but indicated that if she did he had been done in [place] around 2005.  Of significance in her case is a long-standing psychiatric history which includes a suicide attempt using medications. She also has a history positive for morbid obesity, hypertension, non-insulin-dependent diabetes mellitus, and a recent surgery for a prolapsed bowel. The patient indicates recently having fallen and opening of her abdominal wound. Because of her history of diabetes, she is at high risk of infection and she says that she has not had it evaluated by anybody. We have instructed the patient to immediately go to her surgeon to have this taken care of. We have made all the necessary arrangements for her to be transported for this.   Today's Pain Score: 9 , clinically she looks like a 2-3/10. Reported level of pain is incompatible with clinical obrservations. This may be secondary to a possible lack of understanding on how the pain scale works. Pain Type: Chronic pain Pain Location: Back Pain Orientation: Mid Pain Descriptors / Indicators: Aching, Burning, Constant, Heaviness, Nagging, Pressure, Shooting, Stabbing, Throbbing, Tingling Pain Frequency: Constant  Onset and Duration: Sudden and Present longer than 3 months Cause of pain: Unknown Severity: Getting worse, NAS-11 at its worse: 8/10, NAS-11 now: 6/10 and NAS-11 on the average: 5/10 Timing: Morning, Afternoon, Night and During activity or exercise Aggravating Factors: Bending, Climbing, Kneeling, Lifiting, Motion, Prolonged  sitting, Prolonged standing,  Squatting, Stooping , Walking, Walking uphill, Walking downhill and Working Alleviating Factors: Medications, Resting and Using a brace Associated Problems: Depression, Nausea, Numbness, Spasms, Tingling, Weakness, Pain that wakes patient up and Pain that does not allow patient to sleep Quality of Pain: Aching, Agonizing, Annoying, Burning, Cruel, Deep, Distressing, Dreadful, Exhausting, Fearful, Feeling of weight, Getting longer, Heavy, Horrible, Nagging, Pressure-like, Pulsating, Shooting, Sickening, Stabbing, Superficial, Throbbing, Tingling, Tiring, Toothache-like and Uncomfortable Previous Examinations or Tests: CT scan Previous Treatments: Chiropractic manipulations, Epidural steroid injections, Narcotic medications, Physical Therapy and TENS  Pharmacotherapy Review  Previously Prescribed Opioids: No opioids.  Allergies  Marie Clarke is allergic to diazepam; sulfa antibiotics; azithromycin; cephalexin; ciprofloxacin; flagyl; levofloxacin; vancomycin; penicillins; and pregabalin.  Meds  The patient has a current medication list which includes the following prescription(s): albuterol, aspirin ec, atorvastatin, azelastine, buspirone, cyclobenzaprine, desipramine, escitalopram, fluticasone-salmeterol, glipizide, lamotrigine, levocetirizine, levothyroxine, lyrica, metoprolol succinate, montelukast, multivitamin with minerals, olopatadine hcl, pantoprazole, pregabalin, ranitidine, ranolazine, solifenacin, sucralfate, tiotropium, topiramate, and torsemide. Requested Prescriptions    No prescriptions requested or ordered in this encounter    ROS  Cardiovascular History: Heart trouble, Hypertension, Heart attack ( Date: 2015), Congestive heart failure and Heart catheterization Pulmonary or Respiratory History: Asthma, Bronchitis and Sleep apnea Neurological History: Scoliosis Psychological-Psychiatric History: Psychiatric disorder, Anxiety, Depression, Suicidal ideations  and Attempted suicide Gastrointestinal History: Reflux or heatburn, Irritable Bowel Syndrome (IBS) and Constipation Genitourinary History: Negative for nephrolithiasis, hematuria, renal failure or chronic kidney disease Hematological History: Anemia Endocrine History: Non-insulin-dependent diabetes mellitus and Hypothyroidism Rheumatologic History: Negative for lupus, osteoarthritis, rheumatoid arthritis, myositis, polymyositis or fibromyagia Musculoskeletal History: Negative for myasthenia gravis, muscular dystrophy, multiple sclerosis or malignant hyperthermia Work History: Legally disabled  [place]  Medical:  Ms. Grinde  has a past medical history of CAD (coronary artery disease) ([place]); Diabetes mellitus without complication ([place]); Hypertension; Depression ([place]); Pancreatitis ([place]); Thyroid disease; TIA (transient ischemic attack) ([place]); Anxiety; COPD (chronic obstructive pulmonary disease) ([place]); Asthma; Headache; Diabetes mellitus, type II ([place]); MI (myocardial infarction) ([place]); Sleep apnea; Drug overdose; Anemia; Bipolar disorder ([place]); CHF (congestive heart failure) ([place]); Renal insufficiency; GERD (gastroesophageal reflux disease); Hyperlipidemia; Osteoporosis; Overactive bladder; Reflex sympathetic dystrophy; Sleep apnea; Stroke [facility]); TIA (transient ischemic attack); Muscle ache (09/16/2014); and Left leg pain (04/29/2014). Family: family history includes Anxiety disorder in her father and mother; Bipolar disorder in her father and mother; CAD in her mother; Depression in her father and mother; Diabetes Mellitus II in her mother; Hypertension in her father; Osteoarthritis in her mother; Osteoporosis in her mother; Post-traumatic stress disorder in her sister; Sleep apnea in her mother. Surgical:  has past surgical history that includes prolapse rectum surgery (N/A, July 2016); Abdominal hysterectomy; and Tonsillectomy. Tobacco:  reports that she quit smoking about 9 years ago. Her smoking use  included Cigarettes. She has never used smokeless tobacco. Alcohol:  reports that she does not drink alcohol. Drug:  reports that she does not use illicit drugs.  Physical Exam  Vitals:  Today's Vitals   03/28/15 1118  BP: 113/76  Pulse: 77  Temp: 98.2 F (36.8 C)  TempSrc: Oral  Resp: 16  Height: 5\' 4"  (1.626 m)  Weight: 230 lb (104.327 kg)  SpO2: 100%  PainSc: 9   PainLoc: Back    Calculated BMI: Body mass index is 39.46 kg/(m^2).  General appearance: alert, cooperative, appears stated age, no distress and morbidly obese Eyes: PERLA Respiratory: No evidence respiratory distress, no audible rales or ronchi and no use of accessory muscles of respiration  Cervical  Spine Inspection: Normal anatomy Alignment: Symetrical ROM: Adequate  Upper Extremities Inspection: No gross anomalies detected ROM: Adequate Sensory: Normal Motor: Unremarkable  Thoracic Spine Inspection: No gross anomalies detected Alignment: Symetrical ROM: Adequate Palpation: WNL  Lumbar Spine Inspection: No gross anomalies detected Alignment: Symetrical ROM: Decreased Palpation: Tender Provocative Tests: Lumbar Hyperextension and rotation test: deferred Patrick's Maneuver: deferred Gait: WNL  Lower Extremities Inspection: No gross anomalies detected ROM: Adequate Sensory: Normal Motor: Unremarkable  Toe walk (S1): WNL  Heal walk (L5): WNL   Assessment  Primary Diagnosis & Pertinent Problem List: The primary encounter diagnosis was Chronic pain. Diagnoses of Chronic low back pain (Location of Primary Source of Pain) (Bilateral) (L>R), Chronic lower extremity pain (Left), Abdominal wound dehiscence, initial encounter, Other long term (current) drug therapy, Encounter for pain management planning, Morbid obesity due to excess calories ([place]), Abnormal CT scan, lumbar spine, Lumbar facet hypertrophy, Lumbar facet syndrome (Location of Primary Source of Pain) (Bilateral) (L>R), Lumbar foraminal  stenosis (Bilateral) (L5-S1), Chronic ankle pain (Left), CRPS (complex regional pain syndrome) type I of lower limb, left, Neurogenic pain, Neuropathic pain, Myofascial pain, and History of suicide attempt were also pertinent to this visit.  Visit Diagnosis: 1. Chronic pain   2. Chronic low back pain (Location of Primary Source of Pain) (Bilateral) (L>R)   3. Chronic lower extremity pain (Left)   4. Abdominal wound dehiscence, initial encounter   5. Other long term (current) drug therapy   6. Encounter for pain management planning   7. Morbid obesity due to excess calories ([place])   8. Abnormal CT scan, lumbar spine   9. Lumbar facet hypertrophy   10. Lumbar facet syndrome (Location of Primary Source of Pain) (Bilateral) (L>R)   11. Lumbar foraminal stenosis (Bilateral) (L5-S1)   12. Chronic ankle pain (Left)   13. CRPS (complex regional pain syndrome) type I of lower limb, left   14. Neurogenic pain   15. Neuropathic pain   16. Myofascial pain   17. History of suicide attempt     Assessment: No problem-specific assessment & plan notes found for this encounter.   Plan of Care  Note: As per protocol, today's visit has been an evaluation only. We have not taken over the patient's controlled substance management.  Pharmacotherapy (Medications Ordered): No orders of the defined types were placed in this encounter.    Lab-work & Procedure Ordered: Orders Placed This Encounter  Procedures  . Drugs of abuse screen w/o alc, rtn urine-sln    Volume: 30 ml(s). Minimum 3 ml of urine is needed. Document temperature of fresh sample. Indications: Long term (current) use of opiate analgesic (Z79.891) Test#: [id number] (Comprehensive Profile)  . Comprehensive metabolic panel    Order Specific Question:  Has the patient fasted?    Answer:  No  . C-reactive protein  . Magnesium  . Sedimentation rate  . Vitamin B12    Indication: Bone Pain (M89.9)  . Vitamin D pnl(25-hydrxy+1,25-dihy)-bld  .  Ambulatory referral to Psychology    Referral Priority:  Routine    Referral Type:  Psychiatric    Referral Reason:  Specialty Services Required    Referred to Provider:  Beckey Rutter, PHD    Requested Specialty:  Psychology    Number of Visits Requested:  1  . Nerve conduction test    Standing Status: Future     Number of Occurrences:      Standing Expiration Date: 03/27/2016    Scheduling Instructions:     Referral  to [facility] for testing.    Order Specific Question:  Where should this test be performed?    Answer:  Other    Imaging Ordered: AMB REFERRAL TO PSYCHOLOGY  Interventional Therapies: Scheduled: None at this time. PRN Procedures: Bilateral diagnostic lumbar facet block under fluoroscopic guidance and IV sedation.    Referral(s) or Consult(s): Medical psychology evaluation for substance use disorder. Referral back to her surgeon for wound dehisence.  Medications administered during this visit: Ms. Fizer had no medications administered during this visit.  Future Appointments Date Time Provider [place]  04/01/2015 10:00 AM Shin-Yiing Aurea Graff, PT [facility] None    Primary Care Physician: Sharyne Peach, MD Location: [facility] Note by: Kathlen Brunswick. Dossie Arbour, M.D, DABA, DABAPM, DABPM, DABIPP, FIPP

## 2015-03-28 NOTE — Progress Notes (Signed)
Per patient: seen by Dr. Primus Bravo 01/2014, recommended patient go to [facility] due to overdosing on medication (father passed away); was started on Lyrica. Transferring to this pain clinic because it is closer to home.

## 2015-03-28 NOTE — Patient Instructions (Addendum)
Call [facility] today and describe drainage and incision opening. If they feel the need to see you have a driver take you there today. Patient verbally understands and agrees with above plan.   Please get lab work done as soon as possible.  Once you have seen the psychologist, call this office to schedule your next appointment.

## 2015-04-01 ENCOUNTER — Ambulatory Visit: Payer: Medicare HMO | Admitting: Physical Therapy

## 2015-04-01 LAB — TOXASSURE SELECT 13 (MW), URINE: PDF: 0

## 2015-04-23 ENCOUNTER — Emergency Department
Admission: EM | Admit: 2015-04-23 | Discharge: 2015-04-24 | Disposition: A | Discharge status: Home / Self Care | Payer: Medicare HMO | Attending: Emergency Medicine | Admitting: Emergency Medicine

## 2015-04-23 ENCOUNTER — Encounter: Payer: Self-pay | Admitting: Radiology

## 2015-04-23 ENCOUNTER — Emergency Department: Payer: Medicare HMO

## 2015-04-23 DIAGNOSIS — E119 Type 2 diabetes mellitus without complications: Secondary | ICD-10-CM | POA: Insufficient documentation

## 2015-04-23 DIAGNOSIS — Z880 Allergy status to penicillin: Secondary | ICD-10-CM | POA: Insufficient documentation

## 2015-04-23 DIAGNOSIS — Z881 Allergy status to other antibiotic agents status: Secondary | ICD-10-CM | POA: Diagnosis not present

## 2015-04-23 DIAGNOSIS — Z90710 Acquired absence of both cervix and uterus: Secondary | ICD-10-CM | POA: Insufficient documentation

## 2015-04-23 DIAGNOSIS — Z7984 Long term (current) use of oral hypoglycemic drugs: Secondary | ICD-10-CM | POA: Diagnosis not present

## 2015-04-23 DIAGNOSIS — F329 Major depressive disorder, single episode, unspecified: Secondary | ICD-10-CM | POA: Insufficient documentation

## 2015-04-23 DIAGNOSIS — J45909 Unspecified asthma, uncomplicated: Secondary | ICD-10-CM | POA: Diagnosis not present

## 2015-04-23 DIAGNOSIS — Z79899 Other long term (current) drug therapy: Secondary | ICD-10-CM | POA: Diagnosis not present

## 2015-04-23 DIAGNOSIS — I10 Essential (primary) hypertension: Secondary | ICD-10-CM | POA: Diagnosis not present

## 2015-04-23 DIAGNOSIS — R079 Chest pain, unspecified: Secondary | ICD-10-CM

## 2015-04-23 DIAGNOSIS — J449 Chronic obstructive pulmonary disease, unspecified: Secondary | ICD-10-CM | POA: Insufficient documentation

## 2015-04-23 DIAGNOSIS — Z87891 Personal history of nicotine dependence: Secondary | ICD-10-CM | POA: Insufficient documentation

## 2015-04-23 DIAGNOSIS — E785 Hyperlipidemia, unspecified: Secondary | ICD-10-CM | POA: Insufficient documentation

## 2015-04-23 DIAGNOSIS — Z8673 Personal history of transient ischemic attack (TIA), and cerebral infarction without residual deficits: Secondary | ICD-10-CM | POA: Insufficient documentation

## 2015-04-23 DIAGNOSIS — I2510 Atherosclerotic heart disease of native coronary artery without angina pectoris: Secondary | ICD-10-CM | POA: Diagnosis not present

## 2015-04-23 DIAGNOSIS — I252 Old myocardial infarction: Secondary | ICD-10-CM | POA: Diagnosis not present

## 2015-04-23 DIAGNOSIS — Z888 Allergy status to other drugs, medicaments and biological substances status: Secondary | ICD-10-CM | POA: Insufficient documentation

## 2015-04-23 DIAGNOSIS — R0789 Other chest pain: Secondary | ICD-10-CM

## 2015-04-23 LAB — CBC
HCT: 31.2 % — ABNORMAL LOW (ref 35.0–47.0)
HEMOGLOBIN: 10.3 g/dL — AB (ref 12.0–16.0)
MCH: 27.7 pg (ref 26.0–34.0)
MCHC: 32.8 g/dL (ref 32.0–36.0)
MCV: 84.2 fL (ref 80.0–100.0)
PLATELETS: 305 10*3/uL (ref 150–440)
RBC: 3.71 MIL/uL — ABNORMAL LOW (ref 3.80–5.20)
RDW: 16.5 % — ABNORMAL HIGH (ref 11.5–14.5)
WBC: 7.3 10*3/uL (ref 3.6–11.0)

## 2015-04-23 LAB — BASIC METABOLIC PANEL
ANION GAP: 5 (ref 5–15)
BUN: 21 mg/dL — ABNORMAL HIGH (ref 6–20)
CALCIUM: 8.9 mg/dL (ref 8.9–10.3)
CO2: 22 mmol/L (ref 22–32)
CREATININE: 1.16 mg/dL — AB (ref 0.44–1.00)
Chloride: 108 mmol/L (ref 101–111)
GFR, EST NON AFRICAN AMERICAN: 57 mL/min — AB (ref >60)
GLUCOSE: 126 mg/dL — AB (ref 65–99)
Potassium: 3.7 mmol/L (ref 3.5–5.1)
Sodium: 135 mmol/L (ref 135–145)

## 2015-04-23 LAB — TROPONIN I

## 2015-04-23 LAB — BRAIN NATRIURETIC PEPTIDE: B Natriuretic Peptide: 26 pg/mL (ref 0.0–100.0)

## 2015-04-23 MED ORDER — ONDANSETRON 4 MG PO TBDP
4.0000 mg | ORAL_TABLET | Freq: Once | ORAL | Status: AC
  Administered 2015-04-23: 4 mg via ORAL
  Filled 2015-04-23: qty 1

## 2015-04-23 MED ORDER — IOHEXOL 350 MG/ML SOLN
75.0000 mL | Freq: Once | INTRAVENOUS | Status: AC | PRN
  Administered 2015-04-23: 75 mL via INTRAVENOUS

## 2015-04-23 MED ORDER — OXYCODONE-ACETAMINOPHEN 5-325 MG PO TABS
1.0000 | ORAL_TABLET | Freq: Once | ORAL | Status: AC
  Administered 2015-04-23: 1 via ORAL
  Filled 2015-04-23: qty 1

## 2015-04-23 MED ORDER — ASPIRIN 81 MG PO CHEW
324.0000 mg | CHEWABLE_TABLET | Freq: Once | ORAL | Status: AC
  Administered 2015-04-23: 324 mg via ORAL
  Filled 2015-04-23: qty 4

## 2015-04-23 NOTE — ED Notes (Signed)
Chest pain for past 2 days comes and goes reports nausea and SOB.

## 2015-04-23 NOTE — Discharge Instructions (Signed)
You have been seen in the Emergency Department (ED) today for chest pain.  As we have discussed todays test results do not indicate a "heart attack" presently, but you require further testing.  Please follow up with your doctor as instructed above in these documents regarding todays emergent visit and your recent symptoms to discuss further management.  Continue to take your regular medications. If you are not doing so already, please also take a daily baby aspirin (81 mg), at least until you follow up with your doctor.  Return to the Emergency Department (ED) if you experience any further chest pain/pressure/tightness, difficulty breathing, have a fever, abdominal pain, or sudden sweating, or other symptoms that concern you.   Chest Pain Observation It is often hard to give a specific diagnosis for the cause of chest pain. Among other possibilities your symptoms might be caused by inadequate oxygen delivery to your heart (angina). Angina that is not treated or evaluated can lead to a heart attack (myocardial infarction) or death. Blood tests, electrocardiograms, and X-rays may have been done to help determine a possible cause of your chest pain. After evaluation and observation, your health care provider has determined that it is unlikely your pain was caused by an unstable condition that requires hospitalization. However, a full evaluation of your pain may need to be completed, with additional diagnostic testing as directed. It is very important to keep your follow-up appointments. Not keeping your follow-up appointments could result in permanent heart damage, disability, or death. If there is any problem keeping your follow-up appointments, you must call your health care provider. HOME CARE INSTRUCTIONS  Due to the slight chance that your pain could be angina, it is important to follow your health care provider's treatment plan and also maintain a healthy lifestyle:  Maintain or work toward  achieving a healthy weight.  Stay physically active and exercise regularly.  Decrease your salt intake.  Eat a balanced, healthy diet. Talk to a dietitian to learn about heart-healthy foods.  Increase your fiber intake by including whole grains, vegetables, fruits, and nuts in your diet.  Avoid situations that cause stress, anger, or depression.  Take medicines as advised by your health care provider. Report any side effects to your health care provider. Do not stop medicines or adjust the dosages on your own.  Quit smoking. Do not use nicotine patches or gum until you check with your health care provider.  Keep your blood pressure, blood sugar, and cholesterol levels within normal limits.  Limit alcohol intake to no more than 1 drink per day for women who are not pregnant and 2 drinks per day for men.  Do not abuse drugs. SEEK IMMEDIATE MEDICAL CARE IF: You have severe chest pain or pressure which may include symptoms such as:  You feel pain or pressure in your arms, neck, jaw, or back.  You have severe back or abdominal pain, feel sick to your stomach (nauseous), or throw up (vomit).  You are sweating profusely.  You are having a fast or irregular heartbeat.  You feel short of breath while at rest.  You notice increasing shortness of breath during rest, sleep, or with activity.  You have chest pain that does not get better after rest or after taking your usual medicine.  You wake from sleep with chest pain.  You are unable to sleep because you cannot breathe.  You develop a frequent cough or you are coughing up blood.  You feel dizzy, faint, or experience extreme  fatigue.  You develop severe weakness, dizziness, fainting, or chills. Any of these symptoms may represent a serious problem that is an emergency. Do not wait to see if the symptoms will go away. Call your local emergency services (911 in the [place] Do not drive yourself to the hospital. MAKE SURE  YOU:  Understand these instructions.  Will watch your condition.  Will get help right away if you are not doing well or get worse.   This information is not intended to replace advice given to you by your health care provider. Make sure you discuss any questions you have with your health care provider.   Document Released: 03/03/2010 Document Revised: 02/03/2013 Document Reviewed: 07/31/2012 Elsevier Interactive Patient Education [employer].

## 2015-04-23 NOTE — ED Notes (Signed)
Lab called to notify of add on orders; spoke to yakana; they have a new green top for troponin; she says BNP can be run off purple top sent earlier

## 2015-04-23 NOTE — ED Provider Notes (Signed)
[facility] Emergency Department Provider Note ____________________________________________  Time seen: Approximately 10:22 PM  I have reviewed the triage vital signs and the nursing notes.   HISTORY  Chief Complaint Chest Pain; Leg Swelling; and Shortness of Breath    HPI Marie Clarke is a 45 y.o. female history of coronary disease, diabetes, hypertension, COPD.  Patient also has a history of medication noncompliance telling me that she is stop most all of her medicines except for Ranexa. Date she stop most of her medications about a year and a half ago.  The patient reports she's had some mild shortness of breath, nausea and left-sided chest discomfort for about the last 3-4 days. States it's a slight hard to describe sensation that does not worsen with deep inspiration, walking, or exertion. She states she's had some mild nausea but no vomiting. No abdominal pain.  She does have mild swelling in both legs however this is improved. She did have a recent surgery and has been recovering well from it, but did have a fairly large procedure done at [facility] about 3-4 weeks ago on her abdomen. Denies abdominal pain. No fevers or chills.   Past Medical History  Diagnosis Date  . CAD (coronary artery disease) unk  . Diabetes mellitus without complication ([place])   . Hypertension   . Depression unk  . Pancreatitis unk  . Thyroid disease   . TIA (transient ischemic attack) unk  . Anxiety   . COPD (chronic obstructive pulmonary disease) ([place])   . Asthma   . Headache   . Diabetes mellitus, type II ([place])   . MI (myocardial infarction) ([place])   . Sleep apnea     pt reported on 2/6/7 she currently is not using CPAP  . Drug overdose   . Anemia   . Bipolar disorder ([place])   . CHF (congestive heart failure) ([place])   . Renal insufficiency   . GERD (gastroesophageal reflux disease)   . Hyperlipidemia   . Osteoporosis   . Overactive bladder   . Reflex sympathetic dystrophy    . Sleep apnea   . Stroke ([place])   . TIA (transient ischemic attack)   . Muscle ache 09/16/2014  . Left leg pain 04/29/2014    Patient Active Problem List   Diagnosis Date Noted  . Chronic pain 03/28/2015  . Chronic low back pain (Location of Primary Source of Pain) (Bilateral) (L>R) 03/28/2015  . Chronic lower extremity pain (Left) 03/28/2015  . Abdominal wound dehiscence 03/28/2015  . Encounter for pain management planning 03/28/2015  . Morbid obesity ([place]) 03/28/2015  . Abnormal CT scan, lumbar spine 03/28/2015  . Lumbar facet hypertrophy 03/28/2015  . Lumbar facet syndrome (Location of Primary Source of Pain) (Bilateral) (L>R) 03/28/2015  . Lumbar foraminal stenosis (Bilateral) (L5-S1) 03/28/2015  . Chronic ankle pain (Left) 03/28/2015  . CRPS (complex regional pain syndrome) type I of lower limb (left ankle) 03/28/2015  . Neurogenic pain 03/28/2015  . Neuropathic pain 03/28/2015  . Myofascial pain 03/28/2015  . History of suicide attempt 03/28/2015  . 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 Abnormal gait 12/15/2014  . Congestive heart failure ([place]) 11/15/2014  . Abdominal wall abscess 09/20/2014  . 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 Diabetes mellitus, type 2 ([place]) 08/13/2014  . Bipolar affective disorder ([place]) 08/13/2014  . Type 2 diabetes mellitus ([place]) 08/13/2014  . Rectal prolapse 08/09/2014  . Rectal bleeding 08/09/2014  . Rectal bleed 08/09/2014  . Affective bipolar disorder ([place]) 08/05/2014  . Arteriosclerosis of  coronary artery 08/05/2014  . CCF (congestive cardiac failure) ([place]) 08/05/2014  . Chronic kidney disease 08/05/2014  . 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 Apnea, sleep 08/05/2014  . Temporary cerebral vascular dysfunction 08/05/2014  . Polypharmacy 04/29/2014  . Other long term (current) drug therapy 04/29/2014  . 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 Chronic kidney disease, stage III (moderate) 12/14/2013  . Controlled diabetes mellitus type II  without complication ([place]) 99991111  . Essential (primary) hypertension 12/03/2013  . Adult hypothyroidism 12/03/2013  . Controlled type 2 diabetes mellitus without complication ([place]) 99991111    Past Surgical History  Procedure Laterality Date  . Prolapse rectum surgery N/A July 2016  . Abdominal hysterectomy    . Tonsillectomy      Current Outpatient Rx  Name  Route  Sig  Dispense  Refill  . albuterol (PROVENTIL HFA;VENTOLIN HFA) 108 (90 BASE) MCG/ACT inhaler   Inhalation   Inhale 2 puffs into the lungs every 4 (four) hours as needed for wheezing or shortness of breath.         Marland Kitchen aspirin EC 81 MG tablet   Oral   Take 81 mg by mouth daily at 12 noon.         Marland Kitchen atorvastatin (LIPITOR) 40 MG tablet   Oral   Take 40 mg by mouth at bedtime.          Marland Kitchen azelastine (ASTELIN) 0.1 % nasal spray   Each Nare   Place 1 spray into both nostrils daily.         . busPIRone (BUSPAR) 5 MG tablet   Oral   Take 5 mg by mouth 3 (three) times daily.         . cyclobenzaprine (FLEXERIL) 5 MG tablet   Oral   Take 5 mg by mouth 3 (three) times daily as needed for muscle spasms.         Marland Kitchen desipramine (NORPRAMIN) 25 MG tablet   Oral   Take 25 mg by mouth at bedtime.         Marland Kitchen escitalopram (LEXAPRO) 20 MG tablet   Oral   Take 1 tablet (20 mg total) by mouth every morning.   30 tablet   4   . fluticasone-salmeterol (ADVAIR HFA) 230-21 MCG/ACT inhaler   Inhalation   Inhale 2 puffs into the lungs 2 (two) times daily.         Marland Kitchen glipiZIDE (GLUCOTROL XL) 10 MG 24 hr tablet   Oral   Take 10 mg by mouth daily as needed (for sugar greater than or equal to 200.).          Marland Kitchen lamoTRIgine (LAMICTAL) 150 MG tablet   Oral   Take 150 mg by mouth 2 (two) times daily.         Marland Kitchen levocetirizine (XYZAL) 5 MG tablet   Oral   Take 5 mg by mouth at bedtime.          Marland Kitchen levothyroxine (SYNTHROID, LEVOTHROID) 200 MCG tablet   Oral   Take 200 mcg by mouth daily.          Marland Kitchen  LYRICA 200 MG capsule      200 mg 2 (two) times daily.            Dispense as written.   . metoprolol succinate (TOPROL-XL) 25 MG 24 hr tablet   Oral   Take 12.5 mg by mouth at bedtime.         . montelukast (SINGULAIR) 10  MG tablet   Oral   Take 10 mg by mouth daily.          . Multiple Vitamin (MULTIVITAMIN WITH MINERALS) TABS tablet   Oral   Take 1 tablet by mouth daily.         . Olopatadine HCl (PATADAY) 0.2 % SOLN   Ophthalmic   Apply 1 drop to eye as needed (for dry eyes).         . pantoprazole (PROTONIX) 40 MG tablet   Oral   Take 40 mg by mouth 2 (two) times daily.         . pregabalin (LYRICA) 200 MG capsule   Oral   Take 200 mg by mouth 3 (three) times daily.         . ranitidine (ZANTAC) 300 MG tablet   Oral   Take 1 tablet (300 mg total) by mouth 2 (two) times daily. Patient taking differently: Take 300 mg by mouth daily.    60 tablet   0   . ranolazine (RANEXA) 500 MG 12 hr tablet   Oral   Take 500 mg by mouth 2 (two) times daily.         . solifenacin (VESICARE) 10 MG tablet   Oral   Take 10 mg by mouth at bedtime.         . sucralfate (CARAFATE) 1 G tablet   Oral   Take 1 tablet (1 g total) by mouth 4 (four) times daily.   120 tablet   1   . tiotropium (SPIRIVA) 18 MCG inhalation capsule   Inhalation   Place 18 mcg into inhaler and inhale at bedtime.         . topiramate (TOPAMAX) 100 MG tablet   Oral   Take 100 mg by mouth 3 (three) times daily.         Marland Kitchen torsemide (DEMADEX) 20 MG tablet   Oral   Take 20 mg by mouth daily.           Allergies Diazepam; Sulfa antibiotics; Azithromycin; Cephalexin; Ciprofloxacin; Flagyl; Levofloxacin; Vancomycin; Penicillins; and Pregabalin  Family History  Problem Relation Age of Onset  . Diabetes Mellitus II Mother   . CAD Mother   . Sleep apnea Mother   . Osteoarthritis Mother   . Osteoporosis Mother   . Anxiety disorder Mother   . Depression Mother   . Bipolar  disorder Mother   . Bipolar disorder Father   . Hypertension Father   . Depression Father   . Anxiety disorder Father   . Post-traumatic stress disorder Sister     Social History Social History  Substance Use Topics  . Smoking status: Former Smoker    Types: Cigarettes    Quit date: 09/09/2005  . Smokeless tobacco: Never Used  . Alcohol Use: No    Review of Systems Constitutional: No fever/chills Eyes: No visual changes. ENT: No sore throat. Cardiovascular: See history of present illness Respiratory: Minimal feeling of shortness of breath, denies wheezing. Currently reports feeling okay Gastrointestinal: No abdominal pain.  No nausea.  No diarrhea.  No constipation. Genitourinary: Negative for dysuria. Musculoskeletal: Negative for back pain. Skin: Negative for rash. Neurological: Negative for headaches, focal weakness or numbness.  10-point ROS otherwise negative.  ____________________________________________   PHYSICAL EXAM:  VITAL SIGNS: ED Triage Vitals  Enc Vitals Group     BP 04/23/15 1456 118/70 mmHg     Pulse Rate 04/23/15 1456 111     Resp 04/23/15  1456 20     Temp 04/23/15 1456 98 F (36.7 C)     Temp Source 04/23/15 1456 Oral     SpO2 04/23/15 1456 96 %     Weight 04/23/15 1456 235 lb (106.595 kg)     Height 04/23/15 1456 5\' 4"  (1.626 m)     Head Cir --      Peak Flow --      Pain Score 04/23/15 1507 9     Pain Loc --      Pain Edu? --      Excl. in [place]? --    Constitutional: Alert and oriented. Well appearing and in no acute distress. Eyes: Conjunctivae are normal. PERRL. EOMI. Head: Atraumatic. Nose: No congestion/rhinnorhea. Mouth/Throat: Mucous membranes are moist.  Oropharynx non-erythematous. Neck: No stridor.   Cardiovascular: Normal rate, regular rhythm. Grossly normal heart sounds.  Good peripheral circulation. Respiratory: Normal respiratory effort.  No retractions. Lungs CTAB.Speaks in full and clear sentences. Gastrointestinal:  Soft and nontender. Lower midline ventral incision clean dry intact. No distention. No abdominal bruits. No CVA tenderness. Musculoskeletal: No lower extremity tenderness with 1+ bilateral lower extremity edema.  No joint effusions. Neurologic:  Normal speech and language. No gross focal neurologic deficits are appreciated. Skin:  Skin is warm, dry and intact. No rash noted. Psychiatric: Mood and affect are normal. Speech and behavior are normal.  ____________________________________________   LABS (all labs ordered are listed, but only abnormal results are displayed)  Labs Reviewed  BASIC METABOLIC PANEL - Abnormal; Notable for the following:    Glucose, Bld 126 (*)    BUN 21 (*)    Creatinine, Ser 1.16 (*)    GFR calc non Af Amer 57 (*)    All other components within normal limits  CBC - Abnormal; Notable for the following:    RBC 3.71 (*)    Hemoglobin 10.3 (*)    HCT 31.2 (*)    RDW 16.5 (*)    All other components within normal limits  TROPONIN I  TROPONIN I  BRAIN NATRIURETIC PEPTIDE   ____________________________________________  EKG  ED ECG REPORT I, QUALE, MARK, the attending physician, personally viewed and interpreted this ECG.  Date: 04/23/2015 EKG Time: 1505 Rate: 70 Rhythm: normal sinus rhythm QRS Axis: normal Intervals: normal ST/T Wave abnormalities: normal Conduction Disturbances: none Narrative Interpretation: unremarkable though some slight artifact is noted  ____________________________________________  RADIOLOGY  CT Angio Chest PE W/Cm &/Or Wo Cm (Final result) Result time: 04/23/15 22:33:48   Final result by Rad Results In Interface (04/23/15 22:33:48)   Narrative:   CLINICAL DATA: Chest pain on the left for a few days.  EXAM: CT ANGIOGRAPHY CHEST WITH CONTRAST  TECHNIQUE: Multidetector CT imaging of the chest was performed using the standard protocol during bolus administration of intravenous contrast. Multiplanar CT image  reconstructions and MIPs were obtained to evaluate the vascular anatomy.  CONTRAST: 57mL OMNIPAQUE IOHEXOL 350 MG/ML SOLN  COMPARISON: None.  FINDINGS: THORACIC INLET/BODY WALL:  No acute abnormality.  MEDIASTINUM:  Normal heart size. No pericardial effusion. Suboptimal pulmonary artery contrast timing. The upper lobes are well opacified and there is no evidence of pulmonary embolism. In the basilar lungs there is poor opacification beyond the proximal segmental level, with no pulmonary embolism seen.  Negative thoracic aorta.  LUNG WINDOWS:  Patchy perihilar ground-glass opacity. Linear atelectasis or scarring in the lingula.  4 mm left lower lobe pulmonary nodule, which may be inflammatory  UPPER ABDOMEN:  Partly visible  upper pole the left kidney with probable sub cm cyst, partly visualized.  OSSEOUS:  No acute fracture. No suspicious lytic or blastic lesions.  These results were called by telephone at the time of interpretation on 04/23/2015 at 10:29 pm to Dr. Delman Kitten , who verbally acknowledged these results.  Review of the MIP images confirms the above findings.  IMPRESSION: 1. No evidence of pulmonary embolism, but limited beyond the proximal segmental level and at the bases due to contrast timing. 2. Patchy atelectasis or pneumonitis in the bilateral lungs. 3. 4 mm left lower lobe nodule. Given risk factors for bronchogenic carcinoma, follow-up chest CT at 1 year is recommended. This recommendation follows the consensus statement: Guidelines for Management of Small Pulmonary Nodules Detected on CT Scans: A Statement from the [employer] as published in Radiology 2005; 237:395-400.   Electronically Signed By: Monte Fantasia M.D. On: 04/23/2015 22:33          DG Chest 2 View (Final result) Result time: 04/23/15 17:23:50   Final result by Rad Results In Interface (04/23/15 17:23:50)   Narrative:   CLINICAL DATA: Chest pain  centrally for 2-3 days.  EXAM: CHEST 2 VIEW  COMPARISON: 11/30/2014  FINDINGS: The heart size and mediastinal contours are within normal limits. Both lungs are clear. The visualized skeletal structures are unremarkable.  IMPRESSION: No active cardiopulmonary disease.   Electronically Signed By: Rolm Baptise M.D. On: 04/23/2015 17:23       ____________________________________________   PROCEDURES  Procedure(s) performed: None  Critical Care performed: No  ____________________________________________   INITIAL IMPRESSION / ASSESSMENT AND PLAN / ED COURSE  Pertinent labs & imaging results that were available during my care of the patient were reviewed by me and considered in my medical decision making (see chart for details).  Patient presents for moderate discomfort in the left chest and some mild nausea for about the last 3-4 days. EKG reassuring, 2 troponins normal and her symptoms atypical with no significant exertional component, diaphoresis. She does report some mild shortness of breath that does not seem to be associated to her chest pain, and on further discussion with the patient she reports she has mild shortness of breath on a regular basis with lower leg swelling which is not unusual. She is not hypoxic here. Given her recent surgery I did pursue CT angiography to evaluate for pulmonary emboli for which none is seen. In addition as her was some slight limitation in her angiography I did obtain Dopplers of the lower extremities to rule out DVT. BNP is normal against acute volume overload and no pulmonary vascular congestion is seen on chest x-ray. Lungs are clear. No hypoxia.  Patient abdominal incision is healing well and she has no acute abdominal complaints or pain at this time. No infectious symptoms.  Overall the patient's symptoms seem very atypical, her exam very reassuring. There is probably atelectasis on CT as the patient denies cough, fevers, has  no leukocytosis and I find no evidence to clearly suggest pneumonia. She has clear lungs on exam. Discussed with the patient, and she  reports that she's feeling well at this time (11pm).  If Doppler is negative for DVT Will plan to discharge her to home to follow up with her surgeons as well as primary care doctor. Ongoing care and follow-up on Doppler assigned to Dr. Madaline Savage. ____________________________________________   FINAL CLINICAL IMPRESSION(S) / ED DIAGNOSES  Final diagnoses:  Chest pain  Atypical chest pain      Delman Kitten,  MD 04/23/15 2352

## 2015-04-23 NOTE — ED Notes (Signed)
Pt to ED tonight with c/o pain to upper left chest for a few days; pt was on medication for angina but pt quit taking on her own and threw them away 2 years ago; pt also c/o swelling to her lower extremities for 4 days; short of breath with exertion; chest tightness when she ambulates

## 2015-04-24 NOTE — ED Provider Notes (Signed)
-----------------------------------------   12:36 AM on 04/24/2015 -----------------------------------------  Care was assumed from Dr. Jacqualine Code approximately 11:45 PM pending the results of Doppler ultrasound. Venous Doppler ultrasound of bilateral lower extremity is negative for DVT. I discussed return precautions with the patient, need for close PCP follow-up and she is comfortable with the discharge plan and she reports that she feels well at this time. She appears well and is in no distress.   Doppler ultrasound IMPRESSION: No evidence of deep venous thrombosis in either lower extremity.  Joanne Gavel, MD 04/24/15 [phone]

## 2015-04-28 DIAGNOSIS — R911 Solitary pulmonary nodule: Secondary | ICD-10-CM | POA: Insufficient documentation

## 2015-06-28 ENCOUNTER — Encounter: Payer: Self-pay | Admitting: Psychiatry

## 2015-06-28 ENCOUNTER — Ambulatory Visit (INDEPENDENT_AMBULATORY_CARE_PROVIDER_SITE_OTHER): Payer: Medicare HMO | Admitting: Psychiatry

## 2015-06-28 DIAGNOSIS — F3160 Bipolar disorder, current episode mixed, unspecified: Secondary | ICD-10-CM | POA: Diagnosis not present

## 2015-06-28 MED ORDER — GABAPENTIN 300 MG PO CAPS: ORAL_CAPSULE | ORAL | Status: DC

## 2015-06-28 MED ORDER — BUSPIRONE HCL 15 MG PO TABS: 15.0000 mg | ORAL_TABLET | Freq: Three times a day (TID) | ORAL | Status: DC

## 2015-06-28 MED ORDER — ESCITALOPRAM OXALATE 5 MG PO TABS: 5.0000 mg | ORAL_TABLET | ORAL | Status: DC

## 2015-06-28 MED ORDER — QUETIAPINE FUMARATE 50 MG PO TABS: 50.0000 mg | ORAL_TABLET | Freq: Every day | ORAL | Status: DC

## 2015-06-28 MED ORDER — LAMOTRIGINE 200 MG PO TABS: 200.0000 mg | ORAL_TABLET | Freq: Every day | ORAL | Status: DC

## 2015-06-28 MED ORDER — TOPIRAMATE 100 MG PO TABS: 100.0000 mg | ORAL_TABLET | Freq: Two times a day (BID) | ORAL | Status: DC

## 2015-06-28 NOTE — Progress Notes (Signed)
BH MD/PA/NP OP Progress Note  06/28/2015 12:27 PM Marie Clarke  MRN:  [id number]  Subjective:  Patient is a 45 year old female with history of bipolar disorder who presented for follow-up appointment. She was following Dr. Jimmye Norman in the past. She reported that she has been having mood swings recently and her symptoms are getting worse. She went to see her primary care physician at [facility] and they have also suggested that her symptoms are not stable. She reported that she has been taking high doses of Topamax and lamotrigine. The medications are not helping. She reported that she is allergic to several medications in the past. She has gained a lot of weight in several medications in the past. She reported that she cannot tolerate the Abilify and Latuda. She is willing to have her medications adjusted. She reported that BuSpar is helping her and she wants to go higher on the dose of the medications. She currently denied having any suicidal ideations or plans. She wants to become her own payee at this time. She currently lives with her and that uncle. She was diagnosed as PTSD by Dr. Jimmye Norman due to the death of her parents. She appeared anxious and apprehensive during the interview.   Chief Complaint:   Visit Diagnosis:     ICD-9-CM ICD-10-CM   1. Bipolar I disorder, most recent episode mixed ([place]) 296.60 F31.60     Past Medical History:  Past Medical History  Diagnosis Date  . CAD (coronary artery disease) unk  . Diabetes mellitus without complication ([place])   . Hypertension   . Depression unk  . Pancreatitis unk  . Thyroid disease   . TIA (transient ischemic attack) unk  . Anxiety   . COPD (chronic obstructive pulmonary disease) ([place])   . Asthma   . Headache   . Diabetes mellitus, type II ([place])   . MI (myocardial infarction) ([place])   . Sleep apnea     pt reported on 2/6/7 she currently is not using CPAP  . Drug overdose   . Anemia   . Bipolar disorder ([place])   . CHF (congestive heart  failure) ([place])   . Renal insufficiency   . GERD (gastroesophageal reflux disease)   . Hyperlipidemia   . Osteoporosis   . Overactive bladder   . Reflex sympathetic dystrophy   . Sleep apnea   . Stroke ([place])   . TIA (transient ischemic attack)   . Muscle ache 09/16/2014  . Left leg pain 04/29/2014    Past Surgical History  Procedure Laterality Date  . Prolapse rectum surgery N/A July 2016  . Abdominal hysterectomy    . Tonsillectomy     Family History:  Family History  Problem Relation Age of Onset  . Diabetes Mellitus II Mother   . CAD Mother   . Sleep apnea Mother   . Osteoarthritis Mother   . Osteoporosis Mother   . Anxiety disorder Mother   . Depression Mother   . Bipolar disorder Mother   . Bipolar disorder Father   . Hypertension Father   . Depression Father   . Anxiety disorder Father   . Post-traumatic stress disorder Sister    Social History:  Social History   Social History  . Marital Status: Single    Spouse Name: N/A  . Number of Children: N/A  . Years of Education: N/A   Social History Main Topics  . Smoking status: Former Smoker    Types: Cigarettes    Quit date: 09/09/2005  .  Smokeless tobacco: Never Used  . Alcohol Use: No  . Drug Use: No  . Sexual Activity: Not Currently   Other Topics Concern  . Not on file   Social History Narrative   Additional History:   Assessment:   Musculoskeletal: Strength & Muscle Tone: within normal limits Gait & Station: Slow and ambulates with a cane Patient leans: N/A  Psychiatric Specialty Exam: HPI  Review of Systems  Psychiatric/Behavioral: Positive for depression (she's reporting some day-to-day fluctuation in her mood from depressed to being upbeat). Negative for suicidal ideas, hallucinations, memory loss and substance abuse. The patient is not nervous/anxious and does not have insomnia.   All other systems reviewed and are negative.   There were no vitals taken for this visit.There is no weight  on file to calculate BMI.  General Appearance: Well Groomed  Eye Contact:  Good  Speech:  Normal Rate  Volume:  Normal  Mood:  Good  Affect:  Congruent  Thought Process:  Circumstantial  Orientation:  Full (Time, Place, and Person)  Thought Content:  Negative  Suicidal Thoughts:  No  Homicidal Thoughts:  No  Memory:  Immediate;   Good Recent;   Good Remote;   Good  Judgement:  Good  Insight:  Fair  Psychomotor Activity:  Negative  Concentration:  Good  Recall:  Good  Fund of Knowledge: Good  Language: Good  Akathisia:  Negative  Handed:  Right unknown   AIMS (if indicated):  N/A  Assets:  Desire for Improvement Social Support  ADL's:  Intact  Cognition: WNL  Sleep:  good   Is the patient at risk to self?  No. Has the patient been a risk to self in the past 6 months?  No. Has the patient been a risk to self within the distant past?  Yes.   Is the patient a risk to others?  No. Has the patient been a risk to others in the past 6 months?  No. Has the patient been a risk to others within the distant past?  No.  Current Medications: Current Outpatient Prescriptions  Medication Sig Dispense Refill  . albuterol (PROVENTIL HFA;VENTOLIN HFA) 108 (90 BASE) MCG/ACT inhaler Inhale 2 puffs into the lungs every 4 (four) hours as needed for wheezing or shortness of breath.    Marland Kitchen aspirin EC 81 MG tablet Take 81 mg by mouth daily at 12 noon.    Marland Kitchen atorvastatin (LIPITOR) 40 MG tablet Take 40 mg by mouth at bedtime.     Marland Kitchen azelastine (ASTELIN) 0.1 % nasal spray Place 1 spray into both nostrils daily.    . busPIRone (BUSPAR) 5 MG tablet Take 5 mg by mouth 3 (three) times daily.    . cyclobenzaprine (FLEXERIL) 5 MG tablet Take 5 mg by mouth 3 (three) times daily as needed for muscle spasms.    Marland Kitchen desipramine (NORPRAMIN) 25 MG tablet Take 25 mg by mouth at bedtime.    Marland Kitchen escitalopram (LEXAPRO) 20 MG tablet Take 1 tablet (20 mg total) by mouth every morning. 30 tablet 4  . fluticasone-salmeterol  (ADVAIR HFA) 230-21 MCG/ACT inhaler Inhale 2 puffs into the lungs 2 (two) times daily.    Marland Kitchen glipiZIDE (GLUCOTROL XL) 10 MG 24 hr tablet Take 10 mg by mouth daily as needed (for sugar greater than or equal to 200.).     Marland Kitchen lamoTRIgine (LAMICTAL) 150 MG tablet Take 150 mg by mouth 2 (two) times daily.    Marland Kitchen levocetirizine (XYZAL) 5 MG tablet Take  5 mg by mouth at bedtime.     Marland Kitchen levothyroxine (SYNTHROID, LEVOTHROID) 200 MCG tablet Take 200 mcg by mouth daily.     Marland Kitchen LYRICA 200 MG capsule 200 mg 2 (two) times daily.     . metoprolol succinate (TOPROL-XL) 25 MG 24 hr tablet Take 12.5 mg by mouth at bedtime.    . montelukast (SINGULAIR) 10 MG tablet Take 10 mg by mouth daily.     . Multiple Vitamin (MULTIVITAMIN WITH MINERALS) TABS tablet Take 1 tablet by mouth daily.    . Olopatadine HCl (PATADAY) 0.2 % SOLN Apply 1 drop to eye as needed (for dry eyes).    . pantoprazole (PROTONIX) 40 MG tablet Take 40 mg by mouth 2 (two) times daily.    . pregabalin (LYRICA) 200 MG capsule Take 200 mg by mouth 3 (three) times daily.    . ranitidine (ZANTAC) 300 MG tablet Take 1 tablet (300 mg total) by mouth 2 (two) times daily. (Patient taking differently: Take 300 mg by mouth daily. ) 60 tablet 0  . ranolazine (RANEXA) 500 MG 12 hr tablet Take 500 mg by mouth 2 (two) times daily.    . solifenacin (VESICARE) 10 MG tablet Take 10 mg by mouth at bedtime.    . sucralfate (CARAFATE) 1 G tablet Take 1 tablet (1 g total) by mouth 4 (four) times daily. 120 tablet 1  . tiotropium (SPIRIVA) 18 MCG inhalation capsule Place 18 mcg into inhaler and inhale at bedtime.    . topiramate (TOPAMAX) 100 MG tablet Take 100 mg by mouth 3 (three) times daily.    Marland Kitchen torsemide (DEMADEX) 20 MG tablet Take 20 mg by mouth daily.     No current facility-administered medications for this visit.    Medical Decision Making:  Established Problem, Stable/Improving (1), Review of Medication Regimen & Side Effects (2) and Review of New Medication or  Change in Dosage (2)  Treatment Plan Summary:Medication management and Plan   Discussed with patient about her medications. I will adjust her medications as follows Lamotrigine 200 mg daily BuSpar 15 mg by mouth 3 times a day Lexapro 5 mg in the morning Seroquel 50 minute grams by mouth daily at bedtime Topamax 100 mg by mouth twice a day She will follow-up in one month or earlier depending on her symptoms She agreed with the plan about the medication adjustment and we discussed about the side effects in detail and she demonstrated understanding.   More than 50% of the time spent in psychoeducation, counseling and coordination of care.    This note was generated in part or whole with voice recognition software. Voice regonition is usually quite accurate but there are transcription errors that can and very often do occur. I apologize for any typographical errors that were not detected and corrected. Rainey Pines, MD  06/28/2015, 12:27 PM

## 2015-07-01 ENCOUNTER — Emergency Department: Payer: Medicare HMO

## 2015-07-01 ENCOUNTER — Emergency Department
Admission: EM | Admit: 2015-07-01 | Discharge: 2015-07-01 | Disposition: A | Discharge status: Home / Self Care | Payer: Medicare HMO | Attending: Emergency Medicine | Admitting: Emergency Medicine

## 2015-07-01 DIAGNOSIS — I130 Hypertensive heart and chronic kidney disease with heart failure and stage 1 through stage 4 chronic kidney disease, or unspecified chronic kidney disease: Secondary | ICD-10-CM | POA: Diagnosis not present

## 2015-07-01 DIAGNOSIS — Z7951 Long term (current) use of inhaled steroids: Secondary | ICD-10-CM | POA: Diagnosis not present

## 2015-07-01 DIAGNOSIS — J449 Chronic obstructive pulmonary disease, unspecified: Secondary | ICD-10-CM | POA: Insufficient documentation

## 2015-07-01 DIAGNOSIS — Z7984 Long term (current) use of oral hypoglycemic drugs: Secondary | ICD-10-CM | POA: Insufficient documentation

## 2015-07-01 DIAGNOSIS — I2510 Atherosclerotic heart disease of native coronary artery without angina pectoris: Secondary | ICD-10-CM | POA: Insufficient documentation

## 2015-07-01 DIAGNOSIS — J45909 Unspecified asthma, uncomplicated: Secondary | ICD-10-CM | POA: Diagnosis not present

## 2015-07-01 DIAGNOSIS — E1122 Type 2 diabetes mellitus with diabetic chronic kidney disease: Secondary | ICD-10-CM | POA: Diagnosis not present

## 2015-07-01 DIAGNOSIS — Z79899 Other long term (current) drug therapy: Secondary | ICD-10-CM | POA: Insufficient documentation

## 2015-07-01 DIAGNOSIS — N183 Chronic kidney disease, stage 3 (moderate): Secondary | ICD-10-CM | POA: Insufficient documentation

## 2015-07-01 DIAGNOSIS — Z87891 Personal history of nicotine dependence: Secondary | ICD-10-CM | POA: Diagnosis not present

## 2015-07-01 DIAGNOSIS — I509 Heart failure, unspecified: Secondary | ICD-10-CM | POA: Diagnosis not present

## 2015-07-01 DIAGNOSIS — Z8673 Personal history of transient ischemic attack (TIA), and cerebral infarction without residual deficits: Secondary | ICD-10-CM | POA: Insufficient documentation

## 2015-07-01 DIAGNOSIS — R079 Chest pain, unspecified: Secondary | ICD-10-CM

## 2015-07-01 DIAGNOSIS — E785 Hyperlipidemia, unspecified: Secondary | ICD-10-CM | POA: Insufficient documentation

## 2015-07-01 DIAGNOSIS — F329 Major depressive disorder, single episode, unspecified: Secondary | ICD-10-CM | POA: Diagnosis not present

## 2015-07-01 DIAGNOSIS — I252 Old myocardial infarction: Secondary | ICD-10-CM | POA: Insufficient documentation

## 2015-07-01 DIAGNOSIS — Z7982 Long term (current) use of aspirin: Secondary | ICD-10-CM | POA: Insufficient documentation

## 2015-07-01 DIAGNOSIS — M810 Age-related osteoporosis without current pathological fracture: Secondary | ICD-10-CM | POA: Diagnosis not present

## 2015-07-01 DIAGNOSIS — R0789 Other chest pain: Secondary | ICD-10-CM | POA: Diagnosis present

## 2015-07-01 LAB — COMPREHENSIVE METABOLIC PANEL
ALBUMIN: 3.9 g/dL (ref 3.5–5.0)
ALT: 13 U/L — AB (ref 14–54)
AST: 18 U/L (ref 15–41)
Alkaline Phosphatase: 93 U/L (ref 38–126)
Anion gap: 7 (ref 5–15)
BUN: 16 mg/dL (ref 6–20)
CHLORIDE: 112 mmol/L — AB (ref 101–111)
CO2: 23 mmol/L (ref 22–32)
CREATININE: 1.11 mg/dL — AB (ref 0.44–1.00)
Calcium: 9 mg/dL (ref 8.9–10.3)
GFR calc Af Amer: >60 mL/min (ref >60)
GFR, EST NON AFRICAN AMERICAN: 60 mL/min — AB (ref >60)
GLUCOSE: 106 mg/dL — AB (ref 65–99)
Potassium: 3.7 mmol/L (ref 3.5–5.1)
Sodium: 142 mmol/L (ref 135–145)
Total Bilirubin: 0.2 mg/dL — ABNORMAL LOW (ref 0.3–1.2)
Total Protein: 7.5 g/dL (ref 6.5–8.1)

## 2015-07-01 LAB — CBC
HEMATOCRIT: 33 % — AB (ref 35.0–47.0)
Hemoglobin: 10.6 g/dL — ABNORMAL LOW (ref 12.0–16.0)
MCH: 27.5 pg (ref 26.0–34.0)
MCHC: 32 g/dL (ref 32.0–36.0)
MCV: 85.9 fL (ref 80.0–100.0)
PLATELETS: 227 10*3/uL (ref 150–440)
RBC: 3.84 MIL/uL (ref 3.80–5.20)
RDW: 18.7 % — ABNORMAL HIGH (ref 11.5–14.5)
WBC: 10.6 10*3/uL (ref 3.6–11.0)

## 2015-07-01 LAB — TROPONIN I: Troponin I: <0.03 ng/mL (ref <0.031)

## 2015-07-01 MED ORDER — FUROSEMIDE 40 MG PO TABS
20.0000 mg | ORAL_TABLET | Freq: Once | ORAL | Status: AC
  Administered 2015-07-01: 20 mg via ORAL
  Filled 2015-07-01: qty 1

## 2015-07-01 MED ORDER — ALBUTEROL SULFATE (2.5 MG/3ML) 0.083% IN NEBU
5.0000 mg | INHALATION_SOLUTION | Freq: Once | RESPIRATORY_TRACT | Status: AC
  Administered 2015-07-01: 5 mg via RESPIRATORY_TRACT
  Filled 2015-07-01: qty 6

## 2015-07-01 MED ORDER — FUROSEMIDE 20 MG PO TABS: 20.0000 mg | ORAL_TABLET | Freq: Every day | ORAL | Status: DC

## 2015-07-01 NOTE — ED Notes (Signed)
Pt states for the past 3 days she has had to use her nitro pills three times, pt states that she used it today but cont to have left sided chest pain as well as shortness of breath, pt states that she feels like she is getting enough air

## 2015-07-01 NOTE — ED Notes (Signed)
Pt updated on turn around time for repeat troponin.

## 2015-07-01 NOTE — ED Notes (Signed)
Pt up to restroom without difficulty. Pt updated on wait time for repeat troponin results.

## 2015-07-01 NOTE — ED Notes (Signed)
Patient sleeping on this rn arrival to room. Pt awoken by this rn to introduce self to patient and assess resp status and pain. Pt states "i've been back here for an hour and i want to know why." pt informed will check back with her after this rn receives report to inform pt regarding wait time. Pt immediately calling out after this rn left room, states to ed tech beth, "i'm mad at that Chinmay Squier i want to talk to the charge nurse, i've been here too long." will notify charge rn.

## 2015-07-01 NOTE — ED Notes (Signed)
Pt updated on wait time and lab results. Pt states "i'm sorry i got you confused with the other nurse who just left me here in this room." pt updated on possible wait time for md evaluation. Pt verbalizes understanding. All bell at side. Pt repositioned in bed for comfort.

## 2015-07-01 NOTE — ED Notes (Signed)
Report from felicia, rn.  

## 2015-07-01 NOTE — ED Notes (Signed)
Dr. Clearnce Hasten in to speak with pt.

## 2015-07-01 NOTE — ED Provider Notes (Signed)
[facility] Emergency Department Provider Note   ____________________________________________  Time seen: Approximately 7:30 PM  I have reviewed the triage vital signs and the nursing notes.   HISTORY  Chief Complaint Shortness of Breath   HPI Marie Clarke is a 45 y.o. female with a history of heart failure as well as CAD who is presenting with shortness of breath and chest pain over the past 3 days. Says the pain is been gradually increasing and is constant. Says it is central to her chest and feels like a pressure pain which radiates through to the back. She says that when she has been in congestive heart failure in the past she has felt similarly. She says she's been taking nitroglycerin at home without relief. Says she is also out of her Lasix which she is supposed to be taking 20 mg per day. Says she ran out about one week ago.Sees Dr. Clayborn Bigness for cardiology at the [facility] clinic. Says that since being sat up in the emergency department here her breathing has eased and her pain is decreased onto a 6 out of 10. Shortness of breath worsens with exertion.   Past Medical History  Diagnosis Date  . CAD (coronary artery disease) unk  . Diabetes mellitus without complication ([place])   . Hypertension   . Depression unk  . Pancreatitis unk  . Thyroid disease   . TIA (transient ischemic attack) unk  . Anxiety   . COPD (chronic obstructive pulmonary disease) ([place])   . Asthma   . Headache   . Diabetes mellitus, type II ([place])   . MI (myocardial infarction) ([place])   . Sleep apnea     pt reported on 2/6/7 she currently is not using CPAP  . Drug overdose   . Anemia   . Bipolar disorder ([place])   . CHF (congestive heart failure) ([place])   . Renal insufficiency   . GERD (gastroesophageal reflux disease)   . Hyperlipidemia   . Osteoporosis   . Overactive bladder   . Reflex sympathetic dystrophy   . Sleep apnea   . Stroke ([place])   . TIA (transient ischemic attack)    . Muscle ache 09/16/2014  . Left leg pain 04/29/2014    Patient Active Problem List   Diagnosis Date Noted  . Chronic pain 03/28/2015  . Chronic low back pain (Location of Primary Source of Pain) (Bilateral) (L>R) 03/28/2015  . Chronic lower extremity pain (Left) 03/28/2015  . Abdominal wound dehiscence 03/28/2015  . Encounter for pain management planning 03/28/2015  . Morbid obesity ([place]) 03/28/2015  . Abnormal CT scan, lumbar spine 03/28/2015  . Lumbar facet hypertrophy 03/28/2015  . Lumbar facet syndrome (Location of Primary Source of Pain) (Bilateral) (L>R) 03/28/2015  . Lumbar foraminal stenosis (Bilateral) (L5-S1) 03/28/2015  . Chronic ankle pain (Left) 03/28/2015  . CRPS (complex regional pain syndrome) type I of lower limb (left ankle) 03/28/2015  . Neurogenic pain 03/28/2015  . Neuropathic pain 03/28/2015  . Myofascial pain 03/28/2015  . History of suicide attempt 03/28/2015  . 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 Abnormal gait 12/15/2014  . Congestive heart failure ([place]) 11/15/2014  . Abdominal wall abscess 09/20/2014  . 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 Diabetes mellitus, type 2 ([place]) 08/13/2014  . Bipolar affective disorder ([place]) 08/13/2014  . Type 2 diabetes mellitus ([place]) 08/13/2014  . Rectal prolapse 08/09/2014  . Rectal bleeding 08/09/2014  . Rectal bleed 08/09/2014  . Affective bipolar disorder ([place]) 08/05/2014  . Arteriosclerosis of coronary  artery 08/05/2014  . CCF (congestive cardiac failure) ([place]) 08/05/2014  . Chronic kidney disease 08/05/2014  . 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 Apnea, sleep 08/05/2014  . Temporary cerebral vascular dysfunction 08/05/2014  . Polypharmacy 04/29/2014  . Other long term (current) drug therapy 04/29/2014  . 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 Chronic kidney disease, stage III (moderate) 12/14/2013  . Controlled diabetes mellitus type II without complication ([place]) 99991111  . Essential (primary) hypertension  12/03/2013  . Adult hypothyroidism 12/03/2013  . Controlled type 2 diabetes mellitus without complication ([place]) 99991111    Past Surgical History  Procedure Laterality Date  . Prolapse rectum surgery N/A July 2016  . Abdominal hysterectomy    . Tonsillectomy      Current Outpatient Rx  Name  Route  Sig  Dispense  Refill  . albuterol (PROVENTIL HFA;VENTOLIN HFA) 108 (90 BASE) MCG/ACT inhaler   Inhalation   Inhale 2 puffs into the lungs every 4 (four) hours as needed for wheezing or shortness of breath.         Marland Kitchen aspirin EC 81 MG tablet   Oral   Take 81 mg by mouth daily at 12 noon.         Marland Kitchen atorvastatin (LIPITOR) 40 MG tablet   Oral   Take 40 mg by mouth at bedtime.          Marland Kitchen azelastine (ASTELIN) 0.1 % nasal spray   Each Nare   Place 1 spray into both nostrils daily.         . busPIRone (BUSPAR) 15 MG tablet   Oral   Take 1 tablet (15 mg total) by mouth 3 (three) times daily.   90 tablet   0   . cyclobenzaprine (FLEXERIL) 5 MG tablet   Oral   Take 5 mg by mouth 3 (three) times daily as needed for muscle spasms.         Marland Kitchen escitalopram (LEXAPRO) 5 MG tablet   Oral   Take 1 tablet (5 mg total) by mouth every morning.   30 tablet   0   . fluticasone-salmeterol (ADVAIR HFA) 230-21 MCG/ACT inhaler   Inhalation   Inhale 2 puffs into the lungs 2 (two) times daily.         Marland Kitchen gabapentin (NEURONTIN) 300 MG capsule      QID   120 capsule   0   . glipiZIDE (GLUCOTROL XL) 10 MG 24 hr tablet   Oral   Take 10 mg by mouth daily as needed (for sugar greater than or equal to 200.).          Marland Kitchen lamoTRIgine (LAMICTAL) 200 MG tablet   Oral   Take 1 tablet (200 mg total) by mouth daily.   30 tablet   0   . levocetirizine (XYZAL) 5 MG tablet   Oral   Take 5 mg by mouth at bedtime.          Marland Kitchen levothyroxine (SYNTHROID, LEVOTHROID) 200 MCG tablet   Oral   Take 200 mcg by mouth daily.          . metoprolol succinate (TOPROL-XL) 25 MG 24 hr tablet    Oral   Take 12.5 mg by mouth at bedtime.         . montelukast (SINGULAIR) 10 MG tablet   Oral   Take 10 mg by mouth daily.          . Multiple Vitamin (MULTIVITAMIN WITH MINERALS) TABS tablet   Oral  Take 1 tablet by mouth daily.         . Olopatadine HCl (PATADAY) 0.2 % SOLN   Ophthalmic   Apply 1 drop to eye as needed (for dry eyes).         . QUEtiapine (SEROQUEL) 50 MG tablet   Oral   Take 1 tablet (50 mg total) by mouth at bedtime.   30 tablet   0   . ranitidine (ZANTAC) 300 MG tablet   Oral   Take 1 tablet (300 mg total) by mouth 2 (two) times daily. Patient taking differently: Take 300 mg by mouth daily.    60 tablet   0   . ranolazine (RANEXA) 500 MG 12 hr tablet   Oral   Take 500 mg by mouth 2 (two) times daily.         . solifenacin (VESICARE) 10 MG tablet   Oral   Take 10 mg by mouth at bedtime.         Marland Kitchen tiotropium (SPIRIVA) 18 MCG inhalation capsule   Inhalation   Place 18 mcg into inhaler and inhale at bedtime.         . topiramate (TOPAMAX) 100 MG tablet   Oral   Take 1 tablet (100 mg total) by mouth 2 (two) times daily.   60 tablet   0   . torsemide (DEMADEX) 20 MG tablet   Oral   Take 20 mg by mouth daily.           Allergies Diazepam; Sulfa antibiotics; Cephalexin; Ciprofloxacin; Flagyl; Levofloxacin; Vancomycin; Penicillins; and Pregabalin  Family History  Problem Relation Age of Onset  . Diabetes Mellitus II Mother   . CAD Mother   . Sleep apnea Mother   . Osteoarthritis Mother   . Osteoporosis Mother   . Anxiety disorder Mother   . Depression Mother   . Bipolar disorder Mother   . Bipolar disorder Father   . Hypertension Father   . Depression Father   . Anxiety disorder Father   . Post-traumatic stress disorder Sister     Social History Social History  Substance Use Topics  . Smoking status: Former Smoker    Types: Cigarettes    Quit date: 09/09/2005  . Smokeless tobacco: Never Used  . Alcohol Use:  No    Review of Systems Constitutional: No fever/chills Eyes: No visual changes. ENT: No sore throat. Cardiovascular:As above Respiratory: As above Gastrointestinal: No abdominal pain.  No nausea, no vomiting.  No diarrhea.  No constipation. Genitourinary: Negative for dysuria. Musculoskeletal: Negative for back pain. Skin: Negative for rash. Neurological: Negative for headaches, focal weakness or numbness.  10-point ROS otherwise negative.  ____________________________________________   PHYSICAL EXAM:  VITAL SIGNS: ED Triage Vitals  Enc Vitals Group     BP 07/01/15 1706 96/63 mmHg     Pulse Rate 07/01/15 1706 99     Resp 07/01/15 1706 20     Temp 07/01/15 1706 98.4 F (36.9 C)     Temp Source 07/01/15 1706 Oral     SpO2 07/01/15 1706 97 %     Weight 07/01/15 1706 235 lb (106.595 kg)     Height 07/01/15 1706 5\' 4"  (1.626 m)     Head Cir --      Peak Flow --      Pain Score 07/01/15 1707 10     Pain Loc --      Pain Edu? --      Excl. in [place]? --  Constitutional: Alert and oriented. Well appearing and in no acute distress. Eyes: Conjunctivae are normal. PERRL. EOMI. Head: Atraumatic. Nose: No congestion/rhinnorhea. Mouth/Throat: Mucous membranes are moist.  Neck: No stridor.   Cardiovascular: Normal rate, regular rhythm. Grossly normal heart sounds. Respiratory: Normal respiratory effort.  No retractions. Lungs CTAB. Gastrointestinal: Soft and nontender. No distention.  Musculoskeletal: No lower extremity tenderness nor edema.  No joint effusions. Neurologic:  Normal speech and language. No gross focal neurologic deficits are appreciated.  Skin:  Skin is warm, dry and intact. No rash noted. Psychiatric: Mood and affect are normal. Speech and behavior are normal.  ____________________________________________   LABS (all labs ordered are listed, but only abnormal results are displayed)  Labs Reviewed  CBC - Abnormal; Notable for the following:     Hemoglobin 10.6 (*)    HCT 33.0 (*)    RDW 18.7 (*)    All other components within normal limits  COMPREHENSIVE METABOLIC PANEL - Abnormal; Notable for the following:    Chloride 112 (*)    Glucose, Bld 106 (*)    Creatinine, Ser 1.11 (*)    ALT 13 (*)    Total Bilirubin 0.2 (*)    GFR calc non Af Amer 60 (*)    All other components within normal limits  TROPONIN I  TROPONIN I   ____________________________________________  EKG  ED ECG REPORT I, Doran Stabler, the attending physician, personally viewed and interpreted this ECG.   Date: 07/01/2015  EKG Time: 1702  Rate: 96  Rhythm: Normal sinus  Axis: Normal axis  Intervals:none  ST&T Change: No ST segment elevation or depression. No abnormal T-wave inversion.  ____________________________________________  [id number]   DG Chest 2 View (Final result) Result time: 07/01/15 17:57:31   Final result by Rad Results In Interface (07/01/15 17:57:31)   Narrative:   CLINICAL DATA: Shortness of breath and chest pain  EXAM: CHEST 2 VIEW  COMPARISON: 04/23/2015 chest radiograph.  FINDINGS: Stable cardiomediastinal silhouette with normal heart size. No pneumothorax. No pleural effusion. Diffuse mild prominence of the central interstitial markings. No acute consolidative airspace disease.  IMPRESSION: Diffuse mild prominence of the central interstitial markings, suggesting mild pulmonary edema.   Electronically Signed By: Ilona Sorrel M.D. On: 07/01/2015 17:57    ____________________________________________   PROCEDURES   ____________________________________________   INITIAL IMPRESSION / ASSESSMENT AND PLAN / ED COURSE  Pertinent labs & imaging results that were available during my care of the patient were reviewed by me and considered in my medical decision making (see chart for details).  Patient noncompliant with her Lasix and likely an early congestive heart failure. Says this feels  similarly to her previous episodes of congestive heart failure. We will give Lasix here in the emergency department and discharged home with new Lasix prescription. We'll repeat troponin. Patient knows to follow Dr. Clayborn Bigness this coming Monday.  ----------------------------------------- 9:47 PM on 07/01/2015 -----------------------------------------  Patient with relief of chest pain and no longer short of breath after 20 of Lasix. Heart rate 99 in the room. Patient recently scanned for PE this past March for similar presentation and the CAT scan was negative for PE. Patient says this feels exactly like previous exacerbations of her CHF. We'll discharge with refill of Lasix prescription and she knows to follow up on Monday with her cardiologist. Will be discharged home. She understands the plan and is willing to comply. We also discussed the lab findings as well as diagnosis. ______ ______________________________________   FINAL CLINICAL  IMPRESSION(S) / ED DIAGNOSES  Chest pain. CHF.    NEW MEDICATIONS STARTED DURING THIS VISIT:  New Prescriptions   No medications on file     Note:  This document was prepared using Dragon voice recognition software and may include unintentional dictation errors.    Orbie Pyo, MD 07/01/15 [phone]

## 2015-07-14 ENCOUNTER — Encounter: Payer: Self-pay | Admitting: Podiatry

## 2015-07-14 ENCOUNTER — Ambulatory Visit (INDEPENDENT_AMBULATORY_CARE_PROVIDER_SITE_OTHER): Payer: Medicare HMO | Admitting: Podiatry

## 2015-07-14 ENCOUNTER — Ambulatory Visit (INDEPENDENT_AMBULATORY_CARE_PROVIDER_SITE_OTHER): Payer: Medicare HMO

## 2015-07-14 VITALS — BP 115/54 | HR 90 | Resp 18

## 2015-07-14 DIAGNOSIS — M25572 Pain in left ankle and joints of left foot: Secondary | ICD-10-CM

## 2015-07-14 DIAGNOSIS — M25372 Other instability, left ankle: Secondary | ICD-10-CM

## 2015-07-14 DIAGNOSIS — R52 Pain, unspecified: Secondary | ICD-10-CM | POA: Diagnosis not present

## 2015-07-14 NOTE — Progress Notes (Signed)
   Subjective:    Patient ID: Marie Clarke, female    DOB: 1970-10-12, 45 y.o.   MRN: [id number]  HPI  45 year old female presents the office today for concerns of left ankle pain which is been ongoing since 2005 after a fall. She states her ankle constantly gives out on her. She has not had any tensor brace recently. She does take ibuprofen if needed. She denies any recent injury. No redness or warmth to the area. Rheumatoid pain is that she does a lot of walking or standing. No other complaints.   Review of Systems  All other systems reviewed and are negative.      Objective:   Physical Exam General: AAO x3, NAD  Dermatological: Skin is warm, dry and supple bilateral. Nails x 10 are well manicured; remaining integument appears unremarkable at this time. There are no open sores, no preulcerative lesions, no rash or signs of infection present.  Vascular: Dorsalis Pedis artery and Posterior Tibial artery pedal pulses are 2/4 bilateral with immedate capillary fill time. Pedal hair growth present. No varicosities and no lower extremity edema present bilateral. There is no pain with calf compression, swelling, warmth, erythema.   Neruologic: Grossly intact via light touch bilateral. Vibratory intact via tuning fork bilateral. Protective threshold with Semmes Wienstein monofilament intact to all pedal sites bilateral. Patellar and Achilles deep tendon reflexes 2+ bilateral. No Babinski or clonus noted bilateral.   Musculoskeletal: There is mild swelling to the left ankle. There is mild discomfort on the course the ATFL and the CFL on the left side however there is no gross instability noted. There is no area pinpoint bony tenderness or pain the vibratory sensation. She states that her symptoms are mostly when she walks she feels that she rolls her ankle inwords. MMT 5/5.  Gait: Unassisted, Nonantalgic.      Assessment & Plan:  45 year old female with left ankle instability, chronic -Treatment  options discussed including all alternatives, risks, and complications -X-rays were obtained and reviewed with the patient. No evidence of acute fracture or stress fracture. -Prescribed compound cream to help with the pain. She pedis as well as pain management. -We have, stretching, stretching exercises discussed today. -I discussed conservative and surgical treatment options. I would recommend to conservative treatment. She'll likely benefit from a custom brace. I will have her follow up with [facility] for possible brace. -Follow-up me after brace.   Celesta Gentile, DPM

## 2015-07-19 ENCOUNTER — Emergency Department: Payer: Medicare HMO

## 2015-07-19 ENCOUNTER — Emergency Department
Admission: EM | Admit: 2015-07-19 | Discharge: 2015-07-19 | Disposition: A | Discharge status: Home / Self Care | Payer: Medicare HMO | Attending: Emergency Medicine | Admitting: Emergency Medicine

## 2015-07-19 ENCOUNTER — Encounter: Payer: Self-pay | Admitting: Emergency Medicine

## 2015-07-19 DIAGNOSIS — S99912A Unspecified injury of left ankle, initial encounter: Secondary | ICD-10-CM | POA: Diagnosis present

## 2015-07-19 DIAGNOSIS — Z87891 Personal history of nicotine dependence: Secondary | ICD-10-CM | POA: Diagnosis not present

## 2015-07-19 DIAGNOSIS — Y999 Unspecified external cause status: Secondary | ICD-10-CM | POA: Insufficient documentation

## 2015-07-19 DIAGNOSIS — S93402A Sprain of unspecified ligament of left ankle, initial encounter: Secondary | ICD-10-CM | POA: Insufficient documentation

## 2015-07-19 DIAGNOSIS — E039 Hypothyroidism, unspecified: Secondary | ICD-10-CM | POA: Diagnosis not present

## 2015-07-19 DIAGNOSIS — I130 Hypertensive heart and chronic kidney disease with heart failure and stage 1 through stage 4 chronic kidney disease, or unspecified chronic kidney disease: Secondary | ICD-10-CM | POA: Insufficient documentation

## 2015-07-19 DIAGNOSIS — M810 Age-related osteoporosis without current pathological fracture: Secondary | ICD-10-CM | POA: Diagnosis not present

## 2015-07-19 DIAGNOSIS — Z7982 Long term (current) use of aspirin: Secondary | ICD-10-CM | POA: Insufficient documentation

## 2015-07-19 DIAGNOSIS — I252 Old myocardial infarction: Secondary | ICD-10-CM | POA: Diagnosis not present

## 2015-07-19 DIAGNOSIS — S8002XA Contusion of left knee, initial encounter: Secondary | ICD-10-CM | POA: Diagnosis not present

## 2015-07-19 DIAGNOSIS — I509 Heart failure, unspecified: Secondary | ICD-10-CM | POA: Diagnosis not present

## 2015-07-19 DIAGNOSIS — F329 Major depressive disorder, single episode, unspecified: Secondary | ICD-10-CM | POA: Diagnosis not present

## 2015-07-19 DIAGNOSIS — Z7984 Long term (current) use of oral hypoglycemic drugs: Secondary | ICD-10-CM | POA: Diagnosis not present

## 2015-07-19 DIAGNOSIS — W108XXA Fall (on) (from) other stairs and steps, initial encounter: Secondary | ICD-10-CM | POA: Diagnosis not present

## 2015-07-19 DIAGNOSIS — E119 Type 2 diabetes mellitus without complications: Secondary | ICD-10-CM | POA: Diagnosis not present

## 2015-07-19 DIAGNOSIS — J449 Chronic obstructive pulmonary disease, unspecified: Secondary | ICD-10-CM | POA: Insufficient documentation

## 2015-07-19 DIAGNOSIS — Y9301 Activity, walking, marching and hiking: Secondary | ICD-10-CM | POA: Insufficient documentation

## 2015-07-19 DIAGNOSIS — Y929 Unspecified place or not applicable: Secondary | ICD-10-CM | POA: Insufficient documentation

## 2015-07-19 DIAGNOSIS — I2510 Atherosclerotic heart disease of native coronary artery without angina pectoris: Secondary | ICD-10-CM | POA: Insufficient documentation

## 2015-07-19 DIAGNOSIS — N183 Chronic kidney disease, stage 3 (moderate): Secondary | ICD-10-CM | POA: Insufficient documentation

## 2015-07-19 DIAGNOSIS — Z8673 Personal history of transient ischemic attack (TIA), and cerebral infarction without residual deficits: Secondary | ICD-10-CM | POA: Insufficient documentation

## 2015-07-19 DIAGNOSIS — Z7951 Long term (current) use of inhaled steroids: Secondary | ICD-10-CM | POA: Insufficient documentation

## 2015-07-19 DIAGNOSIS — Z79899 Other long term (current) drug therapy: Secondary | ICD-10-CM | POA: Diagnosis not present

## 2015-07-19 DIAGNOSIS — J45909 Unspecified asthma, uncomplicated: Secondary | ICD-10-CM | POA: Insufficient documentation

## 2015-07-19 DIAGNOSIS — E785 Hyperlipidemia, unspecified: Secondary | ICD-10-CM | POA: Insufficient documentation

## 2015-07-19 MED ORDER — OXYCODONE-ACETAMINOPHEN 5-325 MG PO TABS: 1.0000 | ORAL_TABLET | ORAL | Status: DC | PRN

## 2015-07-19 MED ORDER — OXYCODONE-ACETAMINOPHEN 5-325 MG PO TABS
1.0000 | ORAL_TABLET | Freq: Once | ORAL | Status: AC
  Administered 2015-07-19: 1 via ORAL
  Filled 2015-07-19: qty 1

## 2015-07-19 NOTE — Discharge Instructions (Signed)
Ankle Sprain An ankle sprain is an injury to the strong, fibrous tissues (ligaments) that hold your ankle bones together.  HOME CARE   Put ice on your ankle for 1-2 days or as told by your doctor.  Put ice in a plastic bag.  Place a towel between your skin and the bag.  Leave the ice on for 15-20 minutes at a time, every 2 hours while you are awake.  Only take medicine as told by your doctor.  Raise (elevate) your injured ankle above the level of your heart as much as possible for 2-3 days.  Use crutches if your doctor tells you to. Slowly put your own weight on the affected ankle. Use the crutches until you can walk without pain.  If you have a plaster splint:  Do not rest it on anything harder than a pillow for 24 hours.  Do not put weight on it.  Do not get it wet.  Take it off to shower or bathe.  If given, use an elastic wrap or support stocking for support. Take the wrap off if your toes lose feeling (numb), tingle, or turn cold or blue.  If you have an air splint:  Add or let out air to make it comfortable.  Take it off at night and to shower and bathe.  Wiggle your toes and move your ankle up and down often while you are wearing it. GET HELP IF:  You have rapidly increasing bruising or puffiness (swelling).  Your toes feel very cold.  You lose feeling in your foot.  Your medicine does not help your pain. GET HELP RIGHT AWAY IF:   Your toes lose feeling (numb) or turn blue.  You have severe pain that is increasing. MAKE SURE YOU:   Understand these instructions.  Will watch your condition.  Will get help right away if you are not doing well or get worse.   This information is not intended to replace advice given to you by your health care provider. Make sure you discuss any questions you have with your health care provider.   Document Released: 07/18/2007 Document Revised: 02/19/2014 Document Reviewed: 08/13/2011 Elsevier Interactive Patient  Education 2016 [place] A contusion is a deep bruise. Contusions happen when an injury causes bleeding under the skin. Symptoms of bruising include pain, swelling, and discolored skin. The skin may turn blue, purple, or yellow. HOME CARE   Rest the injured area.  If told, put ice on the injured area.  Put ice in a plastic bag.  Place a towel between your skin and the bag.  Leave the ice on for 20 minutes, 2-3 times per day.  If told, put light pressure (compression) on the injured area using an elastic bandage. Make sure the bandage is not too tight. Remove it and put it back on as told by your doctor.  If possible, raise (elevate) the injured area above the level of your heart while you are sitting or lying down.  Take over-the-counter and prescription medicines only as told by your doctor. GET HELP IF:  Your symptoms do not get better after several days of treatment.  Your symptoms get worse.  You have trouble moving the injured area. GET HELP RIGHT AWAY IF:   You have very bad pain.  You have a loss of feeling (numbness) in a hand or foot.  Your hand or foot turns pale or cold.   This information is not intended to replace advice given  to you by your health care provider. Make sure you discuss any questions you have with your health care provider.   Document Released: 07/18/2007 Document Revised: 10/20/2014 Document Reviewed: 06/16/2014 Elsevier Interactive Patient Education 2016 [place] and elevate as needed for swelling and pain. Wear knee immobilizer for extra support. Use your walker at home for added support. Keep your Appointment with Dr. Iona Beard. Percocet as needed for pain. Take only as directed. Follow-up with Dr. Mack Guise if any worsening of your knee pain.

## 2015-07-19 NOTE — ED Notes (Signed)
Pt to ed with c/o fall today,  States left ankle "gave away" and caused her to fall while walking on steps.  Pt reports fell onto left knee and since has had swelling and pain.

## 2015-07-19 NOTE — ED Provider Notes (Signed)
[facility] Emergency Department Provider Note  ___________________________________________  Time seen: Approximately 12:58 PM  I have reviewed the triage vital signs and the nursing notes.   HISTORY  Chief Complaint Knee Pain    HPI Marie Clarke is a 45 y.o. female is here after her left ankle gave way 2 hours ago causing her to fall onto the steps. Patient landed on her left knee which has become very painful and swollen. Currently she is walking with the assistance of a cane which she generally uses every day. She denies any head injury or loss of consciousness. 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 Patient has not taken any medication for pain prior to arrival in the emergency room. Patient states she does not drive and was brought to the emergency room by private vehicle. She rates her pain as a 10 over 10.   Past Medical History  Diagnosis Date  . CAD (coronary artery disease) unk  . Diabetes mellitus without complication ([place])   . Hypertension   . Depression unk  . Pancreatitis unk  . Thyroid disease   . TIA (transient ischemic attack) unk  . Anxiety   . COPD (chronic obstructive pulmonary disease) ([place])   . Asthma   . Headache   . Diabetes mellitus, type II ([place])   . MI (myocardial infarction) ([place])   . Sleep apnea     pt reported on 2/6/7 she currently is not using CPAP  . Drug overdose   . Anemia   . Bipolar disorder ([place])   . CHF (congestive heart failure) ([place])   . Renal insufficiency   . GERD (gastroesophageal reflux disease)   . Hyperlipidemia   . Osteoporosis   . Overactive bladder   . Reflex sympathetic dystrophy   . Sleep apnea   . Stroke ([place])   . TIA (transient ischemic attack)   . Muscle ache 09/16/2014  . Left leg pain 04/29/2014    Patient Active Problem List   Diagnosis Date Noted  . Chronic pain 03/28/2015  . Chronic low back pain (Location of  Primary Source of Pain) (Bilateral) (L>R) 03/28/2015  . Chronic lower extremity pain (Left) 03/28/2015  . Abdominal wound dehiscence 03/28/2015  . Encounter for pain management planning 03/28/2015  . Morbid obesity ([place]) 03/28/2015  . Abnormal CT scan, lumbar spine 03/28/2015  . Lumbar facet hypertrophy 03/28/2015  . Lumbar facet syndrome (Location of Primary Source of Pain) (Bilateral) (L>R) 03/28/2015  . Lumbar foraminal stenosis (Bilateral) (L5-S1) 03/28/2015  . Chronic ankle pain (Left) 03/28/2015  . CRPS (complex regional pain syndrome) type I of lower limb (left ankle) 03/28/2015  . Neurogenic pain 03/28/2015  . Neuropathic pain 03/28/2015  . Myofascial pain 03/28/2015  . History of suicide attempt 03/28/2015  . 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 Abnormal gait 12/15/2014  . Congestive heart failure ([place]) 11/15/2014  . Abdominal wall abscess 09/20/2014  . 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 Diabetes mellitus, type 2 ([place]) 08/13/2014  . Bipolar affective disorder ([place]) 08/13/2014  . Type 2 diabetes mellitus ([place]) 08/13/2014  . Rectal prolapse 08/09/2014  . Rectal bleeding 08/09/2014  . Rectal bleed 08/09/2014  . Affective bipolar disorder ([place]) 08/05/2014  . Arteriosclerosis of coronary artery 08/05/2014  . CCF (congestive cardiac failure) ([place]) 08/05/2014  . Chronic kidney disease 08/05/2014  . 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 Apnea, sleep 08/05/2014  . Temporary cerebral vascular dysfunction 08/05/2014  .  Polypharmacy 04/29/2014  . Other long term (current) drug therapy 04/29/2014  . 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 Chronic kidney disease, stage III (moderate) 12/14/2013  . Controlled diabetes mellitus type II without complication ([place]) 99991111  . Essential (primary) hypertension 12/03/2013  . Adult hypothyroidism 12/03/2013  . Controlled type 2 diabetes mellitus without complication ([place]) 99991111    Past Surgical History  Procedure Laterality  Date  . Prolapse rectum surgery N/A July 2016  . Abdominal hysterectomy    . Tonsillectomy      Current Outpatient Rx  Name  Route  Sig  Dispense  Refill  . albuterol (PROVENTIL HFA;VENTOLIN HFA) 108 (90 BASE) MCG/ACT inhaler   Inhalation   Inhale 2 puffs into the lungs every 4 (four) hours as needed for wheezing or shortness of breath.         Marland Kitchen aspirin EC 81 MG tablet   Oral   Take 81 mg by mouth daily at 12 noon.         Marland Kitchen atorvastatin (LIPITOR) 40 MG tablet   Oral   Take 40 mg by mouth at bedtime.          Marland Kitchen azelastine (ASTELIN) 0.1 % nasal spray   Each Nare   Place 1 spray into both nostrils daily.         . busPIRone (BUSPAR) 15 MG tablet   Oral   Take 1 tablet (15 mg total) by mouth 3 (three) times daily.   90 tablet   0   . cyclobenzaprine (FLEXERIL) 5 MG tablet   Oral   Take 5 mg by mouth 3 (three) times daily as needed for muscle spasms.         Marland Kitchen escitalopram (LEXAPRO) 5 MG tablet   Oral   Take 1 tablet (5 mg total) by mouth every morning.   30 tablet   0   . fluticasone-salmeterol (ADVAIR HFA) 230-21 MCG/ACT inhaler   Inhalation   Inhale 2 puffs into the lungs 2 (two) times daily.         . furosemide (LASIX) 20 MG tablet   Oral   Take 1 tablet (20 mg total) by mouth daily.   15 tablet   0   . gabapentin (NEURONTIN) 300 MG capsule      QID   120 capsule   0   . glipiZIDE (GLUCOTROL XL) 10 MG 24 hr tablet   Oral   Take 10 mg by mouth daily as needed (for sugar greater than or equal to 200.).          Marland Kitchen lamoTRIgine (LAMICTAL) 200 MG tablet   Oral   Take 1 tablet (200 mg total) by mouth daily.   30 tablet   0   . levocetirizine (XYZAL) 5 MG tablet   Oral   Take 5 mg by mouth at bedtime.          Marland Kitchen levothyroxine (SYNTHROID, LEVOTHROID) 200 MCG tablet   Oral   Take 200 mcg by mouth daily.          . metoprolol succinate (TOPROL-XL) 25 MG 24 hr tablet   Oral   Take 12.5 mg by mouth at bedtime.         .  montelukast (SINGULAIR) 10 MG tablet   Oral   Take 10 mg by mouth daily.          . Multiple Vitamin (MULTIVITAMIN WITH MINERALS) TABS tablet   Oral   Take 1 tablet by mouth  daily.         . Olopatadine HCl (PATADAY) 0.2 % SOLN   Ophthalmic   Apply 1 drop to eye as needed (for dry eyes).         Marland Kitchen oxyCODONE-acetaminophen (PERCOCET) 5-325 MG tablet   Oral   Take 1 tablet by mouth every 4 (four) hours as needed for severe pain.   20 tablet   0   . QUEtiapine (SEROQUEL) 50 MG tablet   Oral   Take 1 tablet (50 mg total) by mouth at bedtime.   30 tablet   0   . ranitidine (ZANTAC) 300 MG tablet   Oral   Take 1 tablet (300 mg total) by mouth 2 (two) times daily. Patient taking differently: Take 300 mg by mouth daily.    60 tablet   0   . ranolazine (RANEXA) 500 MG 12 hr tablet   Oral   Take 500 mg by mouth 2 (two) times daily.         . solifenacin (VESICARE) 10 MG tablet   Oral   Take 10 mg by mouth at bedtime.         Marland Kitchen tiotropium (SPIRIVA) 18 MCG inhalation capsule   Inhalation   Place 18 mcg into inhaler and inhale at bedtime.         . topiramate (TOPAMAX) 100 MG tablet   Oral   Take 1 tablet (100 mg total) by mouth 2 (two) times daily.   60 tablet   0   . torsemide (DEMADEX) 20 MG tablet   Oral   Take 20 mg by mouth daily.           Allergies Diazepam; Sulfa antibiotics; Cephalexin; Ciprofloxacin; Flagyl; Levofloxacin; Vancomycin; Penicillins; and Pregabalin  Family History  Problem Relation Age of Onset  . Diabetes Mellitus II Mother   . CAD Mother   . Sleep apnea Mother   . Osteoarthritis Mother   . Osteoporosis Mother   . Anxiety disorder Mother   . Depression Mother   . Bipolar disorder Mother   . Bipolar disorder Father   . Hypertension Father   . Depression Father   . Anxiety disorder Father   . Post-traumatic stress disorder Sister     Social History Social History  Substance Use Topics  . Smoking status: Former Smoker     Types: Cigarettes    Quit date: 09/09/2005  . Smokeless tobacco: Never Used  . Alcohol Use: No    Review of Systems Constitutional: No fever/chills Eyes: No visual changes. ENT: No sore throat. Cardiovascular: Denies chest pain.History of congestive heart failure. Respiratory: Denies shortness of breath. Gastrointestinal: No abdominal pain.  No nausea, no vomiting.  Musculoskeletal: Positive left ankle pain. Positive left knee pain. Skin: Negative for rash. Neurological: Negative for headaches, focal weakness or numbness.  10-point ROS otherwise negative.  ____________________________________________   PHYSICAL EXAM:  VITAL SIGNS: ED Triage Vitals  Enc Vitals Group     BP 07/19/15 1242 100/68 mmHg     Pulse Rate 07/19/15 1242 99     Resp 07/19/15 1242 18     Temp 07/19/15 1242 98.2 F (36.8 C)     Temp Source 07/19/15 1242 Oral     SpO2 07/19/15 1242 97 %     Weight 07/19/15 1242 235 lb (106.595 kg)     Height 07/19/15 1242 5\' 4"  (1.626 m)     Head Cir --      Peak Flow --  Pain Score 07/19/15 1243 10     Pain Loc --      Pain Edu? --      Excl. in [place]? --     Constitutional: Alert and oriented. Well appearing and in no acute distress. Eyes: Conjunctivae are normal. PERRL. EOMI. Head: Atraumatic. Nose: No congestion/rhinnorhea. Neck: No stridor.   Cardiovascular: Normal rate, regular rhythm. Grossly normal heart sounds.  Good peripheral circulation. Respiratory: Normal respiratory effort.  No retractions. Lungs CTAB. Gastrointestinal: Soft and nontender. No distention.  Musculoskeletal: Examination of the left ankle there is soft tissue edema which appears to be chronic rather than acute. There is some tenderness on palpation. Range of motion is slightly restricted secondary to discomfort. No gross deformity was noted. Examination of the left knee there is moderate tenderness on palpation of the anterior portion with soft tissue swelling noted. No effusion  is appreciated. Range of motion is grossly restricted secondary to patient's pain. Stability of the ligaments is unobtainable secondary to patient's tolerance for movement. Neurologic:  Normal speech and language. No gross focal neurologic deficits are appreciated. No gait instability. Skin:  Skin is warm, dry and intact. No rash noted. No abrasions, ecchymosis or erythema was noted Psychiatric: Mood and affect are normal. Speech and behavior are normal.  ____________________________________________   LABS (all labs ordered are listed, but only abnormal results are displayed)  Labs Reviewed - No data to display  RADIOLOGY  Left knee x-ray per radiologist is negative for fracture, dislocation or effusion. Left ankle x-ray per radiologist no fracture dislocation. I, [name], personally viewed and evaluated these images (plain radiographs) as part of my medical decision making, as well as reviewing the written report by the radiologist.   [facility] / ED COURSE  Pertinent labs & imaging results that were available during my care of the patient were reviewed by me and considered in my medical decision making (see chart for details).  Patient was made aware that she does not have a fracture. Knee immobilizer was placed and patient was encouraged to use her walker rather than using her cane that she has with her today. She is given a prescription for Percocet as she does not have any pain medication at home. She does have an appointment with her primary care doctor on Friday. She is encouraged to use ice and elevation for any swelling. She is also given the name of Dr. Mack Guise if any orthopedic appointment as needed. ____________________________________________   FINAL CLINICAL IMPRESSION(S) / ED DIAGNOSES  Final diagnoses:  Contusion, knee, left, initial encounter  Sprain of left ankle, initial encounter      NEW MEDICATIONS STARTED DURING THIS  VISIT:  New Prescriptions   OXYCODONE-ACETAMINOPHEN (PERCOCET) 5-325 MG TABLET    Take 1 tablet by mouth every 4 (four) hours as needed for severe pain.     Note:  This document was prepared using Dragon voice recognition software and may include unintentional dictation errors.  cedi     [name], PA-C 07/19/15 1451  Lisa Roca, MD 07/19/15 [phone]

## 2015-07-20 ENCOUNTER — Ambulatory Visit: Payer: Medicare HMO

## 2015-07-25 ENCOUNTER — Encounter: Payer: Self-pay | Admitting: Psychiatry

## 2015-07-25 ENCOUNTER — Ambulatory Visit (INDEPENDENT_AMBULATORY_CARE_PROVIDER_SITE_OTHER): Payer: Medicare HMO | Admitting: Psychiatry

## 2015-07-25 VITALS — BP 109/69 | HR 98 | Temp 97.2°F | Resp 106 | Ht 64.0 in | Wt 232.8 lb

## 2015-07-25 DIAGNOSIS — F4310 Post-traumatic stress disorder, unspecified: Secondary | ICD-10-CM

## 2015-07-25 DIAGNOSIS — F3160 Bipolar disorder, current episode mixed, unspecified: Secondary | ICD-10-CM

## 2015-07-25 MED ORDER — BUSPIRONE HCL 15 MG PO TABS: 15.0000 mg | ORAL_TABLET | Freq: Three times a day (TID) | ORAL | Status: DC

## 2015-07-25 MED ORDER — ESCITALOPRAM OXALATE 5 MG PO TABS: 5.0000 mg | ORAL_TABLET | ORAL | Status: DC

## 2015-07-25 MED ORDER — LAMOTRIGINE 200 MG PO TABS: 200.0000 mg | ORAL_TABLET | Freq: Every day | ORAL | Status: DC

## 2015-07-25 MED ORDER — QUETIAPINE FUMARATE 50 MG PO TABS: 50.0000 mg | ORAL_TABLET | Freq: Every day | ORAL | Status: DC

## 2015-07-25 MED ORDER — TOPIRAMATE 100 MG PO TABS: 100.0000 mg | ORAL_TABLET | Freq: Two times a day (BID) | ORAL | Status: DC
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
## 2015-08-02 ENCOUNTER — Emergency Department
Admission: EM | Admit: 2015-08-02 | Discharge: 2015-08-02 | Disposition: A | Discharge status: Home / Self Care | Payer: Medicare HMO | Attending: Emergency Medicine | Admitting: Emergency Medicine

## 2015-08-02 ENCOUNTER — Emergency Department: Payer: Medicare HMO

## 2015-08-02 DIAGNOSIS — S300XXA Contusion of lower back and pelvis, initial encounter: Secondary | ICD-10-CM | POA: Insufficient documentation

## 2015-08-02 DIAGNOSIS — F319 Bipolar disorder, unspecified: Secondary | ICD-10-CM | POA: Diagnosis not present

## 2015-08-02 DIAGNOSIS — M545 Low back pain: Secondary | ICD-10-CM | POA: Diagnosis present

## 2015-08-02 DIAGNOSIS — J45909 Unspecified asthma, uncomplicated: Secondary | ICD-10-CM | POA: Insufficient documentation

## 2015-08-02 DIAGNOSIS — I509 Heart failure, unspecified: Secondary | ICD-10-CM | POA: Insufficient documentation

## 2015-08-02 DIAGNOSIS — I130 Hypertensive heart and chronic kidney disease with heart failure and stage 1 through stage 4 chronic kidney disease, or unspecified chronic kidney disease: Secondary | ICD-10-CM | POA: Diagnosis not present

## 2015-08-02 DIAGNOSIS — N183 Chronic kidney disease, stage 3 (moderate): Secondary | ICD-10-CM | POA: Diagnosis not present

## 2015-08-02 DIAGNOSIS — W07XXXA Fall from chair, initial encounter: Secondary | ICD-10-CM | POA: Insufficient documentation

## 2015-08-02 DIAGNOSIS — Y9389 Activity, other specified: Secondary | ICD-10-CM | POA: Insufficient documentation

## 2015-08-02 DIAGNOSIS — Y929 Unspecified place or not applicable: Secondary | ICD-10-CM | POA: Insufficient documentation

## 2015-08-02 DIAGNOSIS — E1122 Type 2 diabetes mellitus with diabetic chronic kidney disease: Secondary | ICD-10-CM | POA: Insufficient documentation

## 2015-08-02 DIAGNOSIS — F17210 Nicotine dependence, cigarettes, uncomplicated: Secondary | ICD-10-CM | POA: Diagnosis not present

## 2015-08-02 DIAGNOSIS — Z79899 Other long term (current) drug therapy: Secondary | ICD-10-CM | POA: Insufficient documentation

## 2015-08-02 DIAGNOSIS — I2510 Atherosclerotic heart disease of native coronary artery without angina pectoris: Secondary | ICD-10-CM | POA: Insufficient documentation

## 2015-08-02 DIAGNOSIS — Z7984 Long term (current) use of oral hypoglycemic drugs: Secondary | ICD-10-CM | POA: Diagnosis not present

## 2015-08-02 DIAGNOSIS — Z8673 Personal history of transient ischemic attack (TIA), and cerebral infarction without residual deficits: Secondary | ICD-10-CM | POA: Insufficient documentation

## 2015-08-02 DIAGNOSIS — W19XXXA Unspecified fall, initial encounter: Secondary | ICD-10-CM

## 2015-08-02 DIAGNOSIS — Z7982 Long term (current) use of aspirin: Secondary | ICD-10-CM | POA: Diagnosis not present

## 2015-08-02 DIAGNOSIS — I252 Old myocardial infarction: Secondary | ICD-10-CM | POA: Insufficient documentation

## 2015-08-02 DIAGNOSIS — Y999 Unspecified external cause status: Secondary | ICD-10-CM | POA: Diagnosis not present

## 2015-08-02 DIAGNOSIS — J449 Chronic obstructive pulmonary disease, unspecified: Secondary | ICD-10-CM | POA: Diagnosis not present

## 2015-08-02 DIAGNOSIS — E039 Hypothyroidism, unspecified: Secondary | ICD-10-CM | POA: Insufficient documentation

## 2015-08-02 DIAGNOSIS — E785 Hyperlipidemia, unspecified: Secondary | ICD-10-CM | POA: Diagnosis not present

## 2015-08-02 DIAGNOSIS — S20229A Contusion of unspecified back wall of thorax, initial encounter: Secondary | ICD-10-CM

## 2015-08-02 MED ORDER — NAPROXEN 500 MG PO TABS: 500.0000 mg | ORAL_TABLET | Freq: Two times a day (BID) | ORAL | Status: DC

## 2015-08-02 MED ORDER — KETOROLAC TROMETHAMINE 30 MG/ML IJ SOLN
30.0000 mg | Freq: Once | INTRAMUSCULAR | Status: AC
  Administered 2015-08-02: 30 mg via INTRAMUSCULAR
  Filled 2015-08-02: qty 1

## 2015-08-02 MED ORDER — CYCLOBENZAPRINE HCL 5 MG PO TABS: 5.0000 mg | ORAL_TABLET | Freq: Three times a day (TID) | ORAL | Status: DC | PRN

## 2015-08-02 NOTE — ED Notes (Signed)
Pt fell out of chair injuring back.

## 2015-08-02 NOTE — Discharge Instructions (Signed)
Contusion °A contusion is a deep bruise. Contusions happen when an injury causes bleeding under the skin. Symptoms of bruising include pain, swelling, and discolored skin. The skin may turn blue, purple, or yellow. °HOME CARE  °· Rest the injured area. °· If told, put ice on the injured area. °· Put ice in a plastic bag. °· Place a towel between your skin and the bag. °· Leave the ice on for 20 minutes, 2-3 times per day. °· If told, put light pressure (compression) on the injured area using an elastic bandage. Make sure the bandage is not too tight. Remove it and put it back on as told by your doctor. °· If possible, raise (elevate) the injured area above the level of your heart while you are sitting or lying down. °· Take over-the-counter and prescription medicines only as told by your doctor. °GET HELP IF: °· Your symptoms do not get better after several days of treatment. °· Your symptoms get worse. °· You have trouble moving the injured area. °GET HELP RIGHT AWAY IF:  °· You have very bad pain. °· You have a loss of feeling (numbness) in a hand or foot. °· Your hand or foot turns pale or cold. °  °This information is not intended to replace advice given to you by your health care provider. Make sure you discuss any questions you have with your health care provider. °  °Document Released: 07/18/2007 Document Revised: 10/20/2014 Document Reviewed: 06/16/2014 °Elsevier Interactive Patient Education ©2016 [employer] ° °Cryotherapy °Cryotherapy is when you put ice on your injury. Ice helps lessen pain and puffiness (swelling) after an injury. Ice works the best when you start using it in the first 24 to 48 hours after an injury. °HOME CARE °· Put a dry or damp towel between the ice pack and your skin. °· You may press gently on the ice pack. °· Leave the ice on for no more than 10 to 20 minutes at a time. °· Check your skin after 5 minutes to make sure your skin is okay. °· Rest at least 20 minutes between ice  pack uses. °· Stop using ice when your skin loses feeling (numbness). °· Do not use ice on someone who cannot tell you when it hurts. This includes small children and people with memory problems (dementia). °GET HELP RIGHT AWAY IF: °· You have white spots on your skin. °· Your skin turns blue or pale. °· Your skin feels waxy or hard. °· Your puffiness gets worse. °MAKE SURE YOU:  °· Understand these instructions. °· Will watch your condition. °· Will get help right away if you are not doing well or get worse. °  °This information is not intended to replace advice given to you by your health care provider. Make sure you discuss any questions you have with your health care provider. °  °Document Released: 07/18/2007 Document Revised: 04/23/2011 Document Reviewed: 09/21/2010 °Elsevier Interactive Patient Education ©2016 [employer] ° °

## 2015-08-02 NOTE — ED Provider Notes (Signed)
[facility] Emergency Department Provider Note  ____________________________________________  Time seen: Approximately 1:13 PM  I have reviewed the triage vital signs and the nursing notes.   HISTORY  Chief Complaint Back Pain    HPI Marie Clarke is a 45 y.o. female , NAD, presents to the emergency department with diffuse back pain after a fall. States she was sitting in a chair on her porch and slipped when she stood up from sitting. States of the ports was wet and the fall was mechanical. States she fell flat to her back and has had pain from the upper back to the lower back and into the buttock since that time. Denies any numbness, weakness, tingling. Has not had any saddle paresthesias nor loss of bowel or bladder control. Has not noted any bleeding or open wounds. Denies head injury, LOC, dizziness, lightheadedness. Has not taken anything for her pain.    Past Medical History  Diagnosis Date  . CAD (coronary artery disease) unk  . Diabetes mellitus without complication ([place])   . Hypertension   . Depression unk  . Pancreatitis unk  . Thyroid disease   . TIA (transient ischemic attack) unk  . Anxiety   . COPD (chronic obstructive pulmonary disease) ([place])   . Asthma   . Headache   . Diabetes mellitus, type II ([place])   . MI (myocardial infarction) ([place])   . Sleep apnea     pt reported on 2/6/7 she currently is not using CPAP  . Drug overdose   . Anemia   . Bipolar disorder ([place])   . CHF (congestive heart failure) ([place])   . Renal insufficiency   . GERD (gastroesophageal reflux disease)   . Hyperlipidemia   . Osteoporosis   . Overactive bladder   . Reflex sympathetic dystrophy   . Sleep apnea   . Stroke ([place])   . TIA (transient ischemic attack)   . Muscle ache 09/16/2014  . Left leg pain 04/29/2014    Patient Active Problem List   Diagnosis Date Noted  . Ankle instability 07/20/2015  . Chronic pain 03/28/2015  . Chronic low back pain (Location  of Primary Source of Pain) (Bilateral) (L>R) 03/28/2015  . Chronic lower extremity pain (Left) 03/28/2015  . Abdominal wound dehiscence 03/28/2015  . Encounter for pain management planning 03/28/2015  . Morbid obesity ([place]) 03/28/2015  . Abnormal CT scan, lumbar spine 03/28/2015  . Lumbar facet hypertrophy 03/28/2015  . Lumbar facet syndrome (Location of Primary Source of Pain) (Bilateral) (L>R) 03/28/2015  . Lumbar foraminal stenosis (Bilateral) (L5-S1) 03/28/2015  . Chronic ankle pain (Left) 03/28/2015  . CRPS (complex regional pain syndrome) type I of lower limb (left ankle) 03/28/2015  . Neurogenic pain 03/28/2015  . Neuropathic pain 03/28/2015  . Myofascial pain 03/28/2015  . History of suicide attempt 03/28/2015  . 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 Abnormal gait 12/15/2014  . Congestive heart failure ([place]) 11/15/2014  . Abdominal wall abscess 09/20/2014  . 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 Diabetes mellitus, type 2 ([place]) 08/13/2014  . Bipolar affective disorder ([place]) 08/13/2014  . Type 2 diabetes mellitus ([place]) 08/13/2014  . Rectal prolapse 08/09/2014  . Rectal bleeding 08/09/2014  . Rectal bleed 08/09/2014  . Affective bipolar disorder ([place]) 08/05/2014  . Arteriosclerosis of coronary artery 08/05/2014  . CCF (congestive cardiac failure) ([place]) 08/05/2014  . Chronic kidney disease 08/05/2014  . 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 Apnea, sleep 08/05/2014  . Temporary cerebral vascular dysfunction 08/05/2014  . Polypharmacy  04/29/2014  . Other long term (current) drug therapy 04/29/2014  . 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 Chronic kidney disease, stage III (moderate) 12/14/2013  . Controlled diabetes mellitus type II without complication ([place]) 99991111  . Essential (primary) hypertension 12/03/2013  . Adult hypothyroidism 12/03/2013  . Controlled type 2 diabetes mellitus without complication ([place]) 99991111    Past Surgical History  Procedure Laterality  Date  . Prolapse rectum surgery N/A July 2016  . Abdominal hysterectomy    . Tonsillectomy      Current Outpatient Rx  Name  Route  Sig  Dispense  Refill  . albuterol (PROVENTIL HFA;VENTOLIN HFA) 108 (90 BASE) MCG/ACT inhaler   Inhalation   Inhale 2 puffs into the lungs every 4 (four) hours as needed for wheezing or shortness of breath.         Marland Kitchen aspirin EC 81 MG tablet   Oral   Take 81 mg by mouth daily at 12 noon.         Marland Kitchen atorvastatin (LIPITOR) 40 MG tablet   Oral   Take 40 mg by mouth at bedtime.          Marland Kitchen azelastine (ASTELIN) 0.1 % nasal spray   Each Nare   Place 1 spray into both nostrils daily.         . busPIRone (BUSPAR) 15 MG tablet   Oral   Take 1 tablet (15 mg total) by mouth 3 (three) times daily.   90 tablet   0   . cyclobenzaprine (FLEXERIL) 5 MG tablet   Oral   Take 1 tablet (5 mg total) by mouth every 8 (eight) hours as needed for muscle spasms.   21 tablet   0   . escitalopram (LEXAPRO) 5 MG tablet   Oral   Take 1 tablet (5 mg total) by mouth every morning.   30 tablet   0   . fluticasone-salmeterol (ADVAIR HFA) 230-21 MCG/ACT inhaler   Inhalation   Inhale 2 puffs into the lungs 2 (two) times daily.         . furosemide (LASIX) 20 MG tablet   Oral   Take 1 tablet (20 mg total) by mouth daily.   15 tablet   0   . gabapentin (NEURONTIN) 300 MG capsule      QID   120 capsule   0   . glipiZIDE (GLUCOTROL XL) 10 MG 24 hr tablet   Oral   Take 10 mg by mouth daily as needed (for sugar greater than or equal to 200.).          Marland Kitchen lamoTRIgine (LAMICTAL) 200 MG tablet   Oral   Take 1 tablet (200 mg total) by mouth daily.   30 tablet   0   . levocetirizine (XYZAL) 5 MG tablet   Oral   Take 5 mg by mouth at bedtime.          Marland Kitchen levothyroxine (SYNTHROID, LEVOTHROID) 200 MCG tablet   Oral   Take 200 mcg by mouth daily.          . metoprolol succinate (TOPROL-XL) 25 MG 24 hr tablet   Oral   Take 12.5 mg by mouth at  bedtime.         . montelukast (SINGULAIR) 10 MG tablet   Oral   Take 10 mg by mouth daily.          . Multiple Vitamin (MULTIVITAMIN WITH MINERALS) TABS tablet   Oral   Take 1  tablet by mouth daily.         . naproxen (NAPROSYN) 500 MG tablet   Oral   Take 1 tablet (500 mg total) by mouth 2 (two) times daily with a meal.   14 tablet   0   . Olopatadine HCl (PATADAY) 0.2 % SOLN   Ophthalmic   Apply 1 drop to eye as needed (for dry eyes).         Marland Kitchen oxyCODONE-acetaminophen (PERCOCET) 5-325 MG tablet   Oral   Take 1 tablet by mouth every 4 (four) hours as needed for severe pain.   20 tablet   0   . QUEtiapine (SEROQUEL) 50 MG tablet   Oral   Take 1 tablet (50 mg total) by mouth at bedtime.   30 tablet   0   . ranitidine (ZANTAC) 300 MG tablet   Oral   Take 1 tablet (300 mg total) by mouth 2 (two) times daily. Patient taking differently: Take 300 mg by mouth daily.    60 tablet   0   . ranolazine (RANEXA) 500 MG 12 hr tablet   Oral   Take 500 mg by mouth 2 (two) times daily.         . solifenacin (VESICARE) 10 MG tablet   Oral   Take 10 mg by mouth at bedtime.         Marland Kitchen tiotropium (SPIRIVA) 18 MCG inhalation capsule   Inhalation   Place 18 mcg into inhaler and inhale at bedtime.         . topiramate (TOPAMAX) 100 MG tablet   Oral   Take 1 tablet (100 mg total) by mouth 2 (two) times daily.   60 tablet   0   . torsemide (DEMADEX) 20 MG tablet   Oral   Take 20 mg by mouth daily.           Allergies Diazepam; Sulfa antibiotics; Cephalexin; Ciprofloxacin; Flagyl; Levofloxacin; Vancomycin; Penicillins; and Pregabalin  Family History  Problem Relation Age of Onset  . Diabetes Mellitus II Mother   . CAD Mother   . Sleep apnea Mother   . Osteoarthritis Mother   . Osteoporosis Mother   . Anxiety disorder Mother   . Depression Mother   . Bipolar disorder Mother   . Bipolar disorder Father   . Hypertension Father   . Depression Father    . Anxiety disorder Father   . Post-traumatic stress disorder Sister     Social History Social History  Substance Use Topics  . Smoking status: Current Every Day Smoker -- 0.50 packs/day    Types: Cigarettes    Last Attempt to Quit: 09/09/2005  . Smokeless tobacco: Never Used  . Alcohol Use: No     Review of Systems  Constitutional: No fever/chills Eyes: No visual changes.  Cardiovascular: No chest pain. Respiratory: No shortness of breath. No wheezing.  Gastrointestinal: No abdominal pain.  No nausea, vomiting.   Musculoskeletal: Positive for back pain.  Skin: Negative for rash, open wounds, lacerations, bruising. Neurological: Negative for headaches, focal weakness or numbness. No LOC, dizziness, saddle paresthesias, loss of bowel or bladder control 10-point ROS otherwise negative.  ____________________________________________   PHYSICAL EXAM:  VITAL SIGNS: ED Triage Vitals  Enc Vitals Group     BP 08/02/15 1221 111/89 mmHg     Pulse Rate 08/02/15 1221 86     Resp 08/02/15 1221 18     Temp 08/02/15 1221 97.6 F (36.4 C)  Temp Source 08/02/15 1221 Oral     SpO2 08/02/15 1221 96 %     Weight --      Height --      Head Cir --      Peak Flow --      Pain Score 08/02/15 1221 10     Pain Loc --      Pain Edu? --      Excl. in [place]? --      Constitutional: Alert and oriented. Well appearing and in no acute distress.  Patient was sitting upright, smiling and laughing having a conversation on her cell phone as I enter the room. Eyes: Conjunctivae are normal.  Head: Atraumatic. Neck: No cervical spine tenderness to palpation. Supple with full range of motion. Hematological/Lymphatic/Immunilogical: No cervical lymphadenopathy. Cardiovascular: Normal rate, regular rhythm. Normal S1 and S2.  Good peripheral circulation with 2+ pulses noted in the upper and lower extremities. Respiratory: Normal respiratory effort without tachypnea or retractions. Lungs CTAB with  breath sounds noted in all lung fields. Musculoskeletal: Full range of motion of the lower back with minimal pain. Patient reports tenderness to palpation about any portion of her back that I palpate. No redness, swelling, bruising, abrasions or lacerations are noted about any portion of her back or torso. Patient is wearing a brace about her right knee and ankle. Patient has full range of motion of her bilateral hips. Full range of motion of the left knee and ankle are noted without pain. No lower extremity tenderness nor edema.  No joint effusions. Neurologic:  Normal speech and language. No gross focal neurologic deficits are appreciated.  Skin:  Skin is warm, dry and intact. No rash noted. Psychiatric: Mood and affect are normal. Speech and behavior are normal. Patient exhibits appropriate insight and judgement.   ____________________________________________   LABS (all labs ordered are listed, but only abnormal results are displayed)  Labs Reviewed - No data to display ____________________________________________  EKG  None ____________________________________________  RADIOLOGY I have personally viewed and evaluated these images (plain radiographs) as part of my medical decision making, as well as reviewing the written report by the radiologist.  Dg Thoracic Spine 2 View  08/02/2015  CLINICAL DATA:  Status post fall from a porch this morning. Thoracic spine pain. Initial encounter. EXAM: THORACIC SPINE 2 VIEWS COMPARISON:  CT chest 04/23/2015.  PA and lateral chest 07/01/2015. FINDINGS: There is no evidence of thoracic spine fracture. Alignment is normal. No other significant bone abnormalities are identified. IMPRESSION: Negative exam. Electronically Signed   By: Inge Rise M.D.   On: 08/02/2015 14:01   Dg Lumbar Spine 2-3 Views  08/02/2015  CLINICAL DATA:  Status post fall off a porch this morning. The patient landed on her back. Pain. Initial encounter. EXAM: LUMBAR SPINE -  2-3 VIEW COMPARISON:  CT lumbar spine 03/03/2015. FINDINGS: There is no evidence of lumbar spine fracture. Alignment is normal. Intervertebral disc spaces are maintained. Surgical clips in the pelvis are incidentally noted IMPRESSION: Negative exam. Electronically Signed   By: Inge Rise M.D.   On: 08/02/2015 13:59   Dg Sacrum/coccyx  08/02/2015  CLINICAL DATA:  Status post fall from a horse this morning. Sacral pain. Initial encounter. EXAM: SACRUM AND COCCYX - 2+ VIEW COMPARISON:  None. FINDINGS: There is no evidence of fracture or other focal bone lesions. IMPRESSION: Negative exam. Electronically Signed   By: Inge Rise M.D.   On: 08/02/2015 14:01    ____________________________________________    PROCEDURES  Procedure(s) performed: None   Medications  ketorolac (TORADOL) 30 MG/ML injection 30 mg (30 mg Intramuscular Given 08/02/15 1322)     ____________________________________________   INITIAL IMPRESSION / ASSESSMENT AND PLAN / ED COURSE  Pertinent imaging results that were available during my care of the patient were reviewed by me and considered in my medical decision making (see chart for details).  Patient's diagnosis is consistent with Contusion of back after fall. Patient will be discharged home with prescriptions for naproxen and Flexeril to take as directed. Patient advised to only use Flexeril at night as the medication is sedating. Patient has an appointment with her orthopedic provider tomorrow for follow-up of her knee. She may follow up with them in regards to her back as needed or may call Dr. Nicholaus Bloom office to schedule an appointment. At the time of discharge patient was sitting in the bed eating peanut butter and crackers without any pain. She states her pain had decreased after the administration of Toradol.  Patient is given ED precautions to return to the ED for any worsening or new symptoms.    ____________________________________________  FINAL  CLINICAL IMPRESSION(S) / ED DIAGNOSES  Final diagnoses:  Contusion, back, unspecified laterality, initial encounter  Fall, initial encounter      NEW MEDICATIONS STARTED DURING THIS VISIT:  Discharge Medication List as of 08/02/2015  2:23 PM    START taking these medications   Details  naproxen (NAPROSYN) 500 MG tablet Take 1 tablet (500 mg total) by mouth 2 (two) times daily with a meal., Starting 08/02/2015, Until Discontinued, [place], PA-C 08/02/15 1454  Wandra Arthurs, MD 08/02/15 1714

## 2015-08-10 ENCOUNTER — Ambulatory Visit: Payer: Medicare HMO | Admitting: Licensed Clinical Social Worker

## 2015-08-24 ENCOUNTER — Ambulatory Visit (INDEPENDENT_AMBULATORY_CARE_PROVIDER_SITE_OTHER): Payer: Medicare HMO | Admitting: Psychiatry

## 2015-08-24 DIAGNOSIS — F4310 Post-traumatic stress disorder, unspecified: Secondary | ICD-10-CM

## 2015-08-24 DIAGNOSIS — F3160 Bipolar disorder, current episode mixed, unspecified: Secondary | ICD-10-CM | POA: Diagnosis not present

## 2015-08-24 MED ORDER — BUSPIRONE HCL 15 MG PO TABS: 15.0000 mg | ORAL_TABLET | Freq: Three times a day (TID) | ORAL | Status: DC

## 2015-08-24 MED ORDER — GABAPENTIN 300 MG PO CAPS: ORAL_CAPSULE | ORAL | Status: DC

## 2015-08-24 MED ORDER — LAMOTRIGINE 200 MG PO TABS: 200.0000 mg | ORAL_TABLET | Freq: Every day | ORAL | Status: DC

## 2015-08-24 MED ORDER — TOPIRAMATE 100 MG PO TABS: 100.0000 mg | ORAL_TABLET | Freq: Two times a day (BID) | ORAL | Status: DC

## 2015-08-24 MED ORDER — ESCITALOPRAM OXALATE 5 MG PO TABS: 5.0000 mg | ORAL_TABLET | ORAL | Status: DC

## 2015-08-24 NOTE — Progress Notes (Signed)
BH MD/PA/NP OP Progress Note  08/24/2015 2:36 PM Marie Clarke  MRN:  [id number]  Subjective:  Patient is a 45 year old female with history of bipolar disorder who presented for follow-up appointment.  She reported that she tripped and fell and injured her knee in June. She was wearing braces and walking with the help of the walker. Patient reported that she continues to feel depressed and anxious as she is living with her uncle and aunt and and has history of dementia. She reported that she becomes agitated with her and has been losing her temper quickly. Patient reported that she does not want to continue taking the Seroquel at night. She currently denied having any suicidal ideations or plans. She reported that she wants to have her medications adjusted. She reported that she has been feeling better on her medications. She stated that she feels anxious repeatedly and the BuSpar has been helping her. She also wants to have his check in her name and was focused on the same. She reported that the uncle has been losing temper with the aunt and she wants to have her check coming in the name and wants to  contact the Social Security about the same.  Patient currently denied having any suicidal ideations or plans.    Chief Complaint:   Visit Diagnosis:     ICD-9-CM ICD-10-CM   1. Bipolar I disorder, most recent episode mixed ([place]) 296.60 F31.60   2. PTSD (post-traumatic stress disorder) 309.81 F43.10     Past Medical History:  Past Medical History  Diagnosis Date  . CAD (coronary artery disease) unk  . Diabetes mellitus without complication ([place])   . Hypertension   . Depression unk  . Pancreatitis unk  . Thyroid disease   . TIA (transient ischemic attack) unk  . Anxiety   . COPD (chronic obstructive pulmonary disease) ([place])   . Asthma   . Headache   . Diabetes mellitus, type II ([place])   . MI (myocardial infarction) ([place])   . Sleep apnea     pt reported on 2/6/7 she currently is not using CPAP   . Drug overdose   . Anemia   . Bipolar disorder ([place])   . CHF (congestive heart failure) ([place])   . Renal insufficiency   . GERD (gastroesophageal reflux disease)   . Hyperlipidemia   . Osteoporosis   . Overactive bladder   . Reflex sympathetic dystrophy   . Sleep apnea   . Stroke ([place])   . TIA (transient ischemic attack)   . Muscle ache 09/16/2014  . Left leg pain 04/29/2014    Past Surgical History  Procedure Laterality Date  . Prolapse rectum surgery N/A July 2016  . Abdominal hysterectomy    . Tonsillectomy     Family History:  Family History  Problem Relation Age of Onset  . Diabetes Mellitus II Mother   . CAD Mother   . Sleep apnea Mother   . Osteoarthritis Mother   . Osteoporosis Mother   . Anxiety disorder Mother   . Depression Mother   . Bipolar disorder Mother   . Bipolar disorder Father   . Hypertension Father   . Depression Father   . Anxiety disorder Father   . Post-traumatic stress disorder Sister    Social History:  Social History   Social History  . Marital Status: Single    Spouse Name: N/A  . Number of Children: N/A  . Years of Education: N/A   Social History Main Topics  .  Smoking status: Current Every Day Smoker -- 0.50 packs/day    Types: Cigarettes    Last Attempt to Quit: 09/09/2005  . Smokeless tobacco: Never Used  . Alcohol Use: No  . Drug Use: No  . Sexual Activity: Not Currently   Other Topics Concern  . Not on file   Social History Narrative   Additional History:   Assessment:   Musculoskeletal: Strength & Muscle Tone: within normal limits Gait & Station: Slow and ambulates with a cane Patient leans: N/A  Psychiatric Specialty Exam: HPI  Review of Systems  Musculoskeletal: Positive for back pain and joint pain.  Psychiatric/Behavioral: Positive for depression (she's reporting some day-to-day fluctuation in her mood from depressed to being upbeat). Negative for suicidal ideas, hallucinations, memory loss and substance  abuse. The patient is nervous/anxious and has insomnia.   All other systems reviewed and are negative.   There were no vitals taken for this visit.There is no weight on file to calculate BMI.  General Appearance: Well Groomed  Eye Contact:  Good  Speech:  Slow  Volume:  Normal  Mood:  Anxious and Depressed  Affect:  Congruent  Thought Process:  Goal Directed  Orientation:  Full (Time, Place, and Person)  Thought Content:  Negative  Suicidal Thoughts:  No  Homicidal Thoughts:  No  Memory:  Immediate;   Good Recent;   Good Remote;   Good  Judgement:  Good  Insight:  Fair  Psychomotor Activity:  Normal  Concentration:  Good  Recall:  Good  Fund of Knowledge: Good  Language: Good  Akathisia:  Negative  Handed:  Right unknown   AIMS (if indicated):  N/A  Assets:  Desire for Improvement Social Support  ADL's:  Intact  Cognition: WNL  Sleep:  good   Is the patient at risk to self?  No. Has the patient been a risk to self in the past 6 months?  No. Has the patient been a risk to self within the distant past?  Yes.   Is the patient a risk to others?  No. Has the patient been a risk to others in the past 6 months?  No. Has the patient been a risk to others within the distant past?  No.  Current Medications: Current Outpatient Prescriptions  Medication Sig Dispense Refill  . albuterol (PROVENTIL HFA;VENTOLIN HFA) 108 (90 BASE) MCG/ACT inhaler Inhale 2 puffs into the lungs every 4 (four) hours as needed for wheezing or shortness of breath.    Marland Kitchen aspirin EC 81 MG tablet Take 81 mg by mouth daily at 12 noon.    Marland Kitchen atorvastatin (LIPITOR) 40 MG tablet Take 40 mg by mouth at bedtime.     Marland Kitchen azelastine (ASTELIN) 0.1 % nasal spray Place 1 spray into both nostrils daily.    . busPIRone (BUSPAR) 15 MG tablet Take 1 tablet (15 mg total) by mouth 3 (three) times daily. 90 tablet 0  . cyclobenzaprine (FLEXERIL) 5 MG tablet Take 1 tablet (5 mg total) by mouth every 8 (eight) hours as needed for  muscle spasms. 21 tablet 0  . escitalopram (LEXAPRO) 5 MG tablet Take 1 tablet (5 mg total) by mouth every morning. 30 tablet 0  . fluticasone-salmeterol (ADVAIR HFA) 230-21 MCG/ACT inhaler Inhale 2 puffs into the lungs 2 (two) times daily.    . furosemide (LASIX) 20 MG tablet Take 1 tablet (20 mg total) by mouth daily. 15 tablet 0  . gabapentin (NEURONTIN) 300 MG capsule QID 120 capsule 0  .  glipiZIDE (GLUCOTROL XL) 10 MG 24 hr tablet Take 10 mg by mouth daily as needed (for sugar greater than or equal to 200.).     Marland Kitchen lamoTRIgine (LAMICTAL) 200 MG tablet Take 1 tablet (200 mg total) by mouth daily. 30 tablet 0  . levocetirizine (XYZAL) 5 MG tablet Take 5 mg by mouth at bedtime.     Marland Kitchen levothyroxine (SYNTHROID, LEVOTHROID) 200 MCG tablet Take 200 mcg by mouth daily.     . metoprolol succinate (TOPROL-XL) 25 MG 24 hr tablet Take 12.5 mg by mouth at bedtime.    . montelukast (SINGULAIR) 10 MG tablet Take 10 mg by mouth daily.     . Multiple Vitamin (MULTIVITAMIN WITH MINERALS) TABS tablet Take 1 tablet by mouth daily.    . naproxen (NAPROSYN) 500 MG tablet Take 1 tablet (500 mg total) by mouth 2 (two) times daily with a meal. 14 tablet 0  . Olopatadine HCl (PATADAY) 0.2 % SOLN Apply 1 drop to eye as needed (for dry eyes).    Marland Kitchen oxyCODONE-acetaminophen (PERCOCET) 5-325 MG tablet Take 1 tablet by mouth every 4 (four) hours as needed for severe pain. 20 tablet 0  . QUEtiapine (SEROQUEL) 50 MG tablet Take 1 tablet (50 mg total) by mouth at bedtime. 30 tablet 0  . ranitidine (ZANTAC) 300 MG tablet Take 1 tablet (300 mg total) by mouth 2 (two) times daily. (Patient taking differently: Take 300 mg by mouth daily. ) 60 tablet 0  . ranolazine (RANEXA) 500 MG 12 hr tablet Take 500 mg by mouth 2 (two) times daily.    . solifenacin (VESICARE) 10 MG tablet Take 10 mg by mouth at bedtime.    Marland Kitchen tiotropium (SPIRIVA) 18 MCG inhalation capsule Place 18 mcg into inhaler and inhale at bedtime.    . topiramate (TOPAMAX)  100 MG tablet Take 1 tablet (100 mg total) by mouth 2 (two) times daily. 60 tablet 0  . torsemide (DEMADEX) 20 MG tablet Take 20 mg by mouth daily.     No current facility-administered medications for this visit.    Medical Decision Making:  Established Problem, Stable/Improving (1), Review of Medication Regimen & Side Effects (2) and Review of New Medication or Change in Dosage (2)  Treatment Plan Summary:Medication management and Plan   Discussed with patient about her medications. I will adjust her medications as follows Lamotrigine 200 mg daily BuSpar 15 mg by mouth 3 times a day Lexapro 5 mg in the morning D/c Seroquel  Topamax 100 mg by mouth twice a day Continue Neurontin 4 times daily as prescribed. She will follow-up in one month or earlier depending on her symptoms She agreed with the plan about the medication adjustment and we discussed about the side effects in detail and she demonstrated understanding.   More than 50% of the time spent in psychoeducation, counseling and coordination of care.    This note was generated in part or whole with voice recognition software. Voice regonition is usually quite accurate but there are transcription errors that can and very often do occur. I apologize for any typographical errors that were not detected and corrected. Rainey Pines, MD  08/24/2015, 2:36 PM

## 2015-09-13 ENCOUNTER — Other Ambulatory Visit: Payer: Self-pay | Admitting: Specialist

## 2015-09-13 DIAGNOSIS — M1712 Unilateral primary osteoarthritis, left knee: Secondary | ICD-10-CM

## 2015-09-21 ENCOUNTER — Encounter: Payer: Self-pay | Admitting: Psychiatry

## 2015-09-21 ENCOUNTER — Ambulatory Visit (INDEPENDENT_AMBULATORY_CARE_PROVIDER_SITE_OTHER): Payer: Medicare HMO | Admitting: Psychiatry

## 2015-09-21 VITALS — BP 116/81 | HR 105 | Temp 97.9°F | Ht 64.0 in | Wt 231.4 lb

## 2015-09-21 DIAGNOSIS — F3160 Bipolar disorder, current episode mixed, unspecified: Secondary | ICD-10-CM

## 2015-09-21 DIAGNOSIS — F4310 Post-traumatic stress disorder, unspecified: Secondary | ICD-10-CM | POA: Diagnosis not present

## 2015-09-21 MED ORDER — GABAPENTIN 300 MG PO CAPS: ORAL_CAPSULE | ORAL | 0 refills | Status: DC

## 2015-09-21 MED ORDER — TOPIRAMATE 100 MG PO TABS: 100.0000 mg | ORAL_TABLET | Freq: Two times a day (BID) | ORAL | 0 refills | Status: DC

## 2015-09-21 MED ORDER — LAMOTRIGINE 200 MG PO TABS: 200.0000 mg | ORAL_TABLET | Freq: Every day | ORAL | 1 refills | Status: DC

## 2015-09-21 NOTE — Progress Notes (Signed)
BH MD/PA/NP OP Progress Note  09/21/2015 2:37 PM Marie Clarke  MRN:  [id number]  Subjective:  Patient is a 45 year old female with history of bipolar disorder who presented for follow-up appointment.  She reported that she is still recuperating from her knee injury. She reported that she has appointment on Friday for an MRI. She stated that she is currently living with her uncle and aunt and she wants to become her own payee. She is managing her own finances. She is does not have any acute issues with the same. She stated that she is able to manage her finances and will be able to manage the money. She denied having any more swings anger anxiety or paranoia. She reported that her uncle and aunt are very supportive. She reported that she has been doing well on her current medications. She currently denied having any suicidal homicidal ideations or plans. She wants her medications to be adjusted at this time. We discussed about stopping the Lexapro and she is agreeable to the plan. She is not feeling anxious at this time.    Chief Complaint:  Chief Complaint    Follow-up; Medication Refill     Visit Diagnosis:     ICD-9-CM ICD-10-CM   1. Bipolar I disorder, most recent episode mixed ([place]) 296.60 F31.60   2. PTSD (post-traumatic stress disorder) 309.81 F43.10     Past Medical History:  Past Medical History:  Diagnosis Date  . Anemia   . Anxiety   . Asthma   . Bipolar disorder ([place])   . CAD (coronary artery disease) unk  . CHF (congestive heart failure) ([place])   . COPD (chronic obstructive pulmonary disease) ([place])   . Depression unk  . Diabetes mellitus without complication ([place])   . Diabetes mellitus, type II ([place])   . Drug overdose   . GERD (gastroesophageal reflux disease)   . Headache   . Hyperlipidemia   . Hypertension   . Left leg pain 04/29/2014  . MI (myocardial infarction) ([place])   . Muscle ache 09/16/2014  . Osteoporosis   . Overactive bladder   . Pancreatitis unk  . Reflex  sympathetic dystrophy   . Renal insufficiency   . Sleep apnea    pt reported on 2/6/7 she currently is not using CPAP  . Sleep apnea   . Stroke ([place])   . Thyroid disease   . TIA (transient ischemic attack) unk  . TIA (transient ischemic attack)     Past Surgical History:  Procedure Laterality Date  . ABDOMINAL HYSTERECTOMY    . prolapse rectum surgery N/A July 2016  . TONSILLECTOMY     Family History:  Family History  Problem Relation Age of Onset  . Diabetes Mellitus II Mother   . CAD Mother   . Sleep apnea Mother   . Osteoarthritis Mother   . Osteoporosis Mother   . Anxiety disorder Mother   . Depression Mother   . Bipolar disorder Mother   . Bipolar disorder Father   . Hypertension Father   . Depression Father   . Anxiety disorder Father   . Post-traumatic stress disorder Sister    Social History:  Social History   Social History  . Marital status: Single    Spouse name: N/A  . Number of children: N/A  . Years of education: N/A   Social History Main Topics  . Smoking status: Current Every Day Smoker    Packs/day: 0.50    Types: Cigarettes    Last attempt  to quit: 09/09/2005  . Smokeless tobacco: Never Used  . Alcohol use No  . Drug use: No  . Sexual activity: Not Currently   Other Topics Concern  . None   Social History Narrative  . None   Additional History:   Assessment:   Musculoskeletal: Strength & Muscle Tone: within normal limits Gait & Station: Slow and ambulates with a cane Patient leans: N/A  Psychiatric Specialty Exam: HPI  Review of Systems  Musculoskeletal: Positive for back pain and joint pain.  Psychiatric/Behavioral: Negative for hallucinations, memory loss, substance abuse and suicidal ideas. Depression: she's reporting some day-to-day fluctuation in her mood from depressed to being upbeat. The patient has insomnia.   All other systems reviewed and are negative.   Blood pressure 116/81, pulse (!) 105, temperature 97.9 F  (36.6 C), temperature source Oral, height 5\' 4"  (1.626 m), weight 231 lb 6.4 oz (105 kg).Body mass index is 39.72 kg/m.  General Appearance: Well Groomed  Eye Contact:  Good  Speech:  Slow  Volume:  Normal  Mood:  Anxious and Depressed  Affect:  Congruent  Thought Process:  Goal Directed  Orientation:  Full (Time, Place, and Person)  Thought Content:  Negative  Suicidal Thoughts:  No  Homicidal Thoughts:  No  Memory:  Immediate;   Good Recent;   Good Remote;   Good  Judgement:  Good  Insight:  Fair  Psychomotor Activity:  Normal  Concentration:  Good  Recall:  Good  Fund of Knowledge: Good  Language: Good  Akathisia:  Negative  Handed:  Right unknown   AIMS (if indicated):  N/A  Assets:  Desire for Improvement Social Support  ADL's:  Intact  Cognition: WNL  Sleep:  good   Is the patient at risk to self?  No. Has the patient been a risk to self in the past 6 months?  No. Has the patient been a risk to self within the distant past?  Yes.   Is the patient a risk to others?  No. Has the patient been a risk to others in the past 6 months?  No. Has the patient been a risk to others within the distant past?  No.  Current Medications: Current Outpatient Prescriptions  Medication Sig Dispense Refill  . albuterol (PROVENTIL HFA;VENTOLIN HFA) 108 (90 BASE) MCG/ACT inhaler Inhale 2 puffs into the lungs every 4 (four) hours as needed for wheezing or shortness of breath.    Marland Kitchen aspirin EC 81 MG tablet Take 81 mg by mouth daily at 12 noon.    Marland Kitchen atorvastatin (LIPITOR) 40 MG tablet Take 40 mg by mouth at bedtime.     Marland Kitchen azelastine (ASTELIN) 0.1 % nasal spray Place 1 spray into both nostrils daily.    . busPIRone (BUSPAR) 15 MG tablet Take 1 tablet (15 mg total) by mouth 3 (three) times daily. 90 tablet 0  . cyclobenzaprine (FLEXERIL) 5 MG tablet Take 1 tablet (5 mg total) by mouth every 8 (eight) hours as needed for muscle spasms. 21 tablet 0  . escitalopram (LEXAPRO) 5 MG tablet Take 1  tablet (5 mg total) by mouth every morning. 30 tablet 0  . fluticasone-salmeterol (ADVAIR HFA) 230-21 MCG/ACT inhaler Inhale 2 puffs into the lungs 2 (two) times daily.    . fluticasone-salmeterol (ADVAIR HFA) 230-21 MCG/ACT inhaler Inhale into the lungs.    . furosemide (LASIX) 20 MG tablet Take 1 tablet (20 mg total) by mouth daily. 15 tablet 0  . gabapentin (NEURONTIN) 300 MG  capsule QID 120 capsule 0  . gabapentin (NEURONTIN) 300 MG capsule QID 120 capsule 0  . glipiZIDE (GLUCOTROL XL) 10 MG 24 hr tablet Take 10 mg by mouth daily as needed (for sugar greater than or equal to 200.).     Marland Kitchen lamoTRIgine (LAMICTAL) 200 MG tablet Take 1 tablet (200 mg total) by mouth daily. 30 tablet 0  . levocetirizine (XYZAL) 5 MG tablet Take 5 mg by mouth at bedtime.     Marland Kitchen levothyroxine (SYNTHROID, LEVOTHROID) 200 MCG tablet Take 200 mcg by mouth daily.     . metoprolol succinate (TOPROL-XL) 25 MG 24 hr tablet Take 12.5 mg by mouth at bedtime.    . montelukast (SINGULAIR) 10 MG tablet Take 10 mg by mouth daily.     . Multiple Vitamin (MULTIVITAMIN WITH MINERALS) TABS tablet Take 1 tablet by mouth daily.    . naproxen (NAPROSYN) 500 MG tablet Take 1 tablet (500 mg total) by mouth 2 (two) times daily with a meal. 14 tablet 0  . Olopatadine HCl (PATADAY) 0.2 % SOLN Apply 1 drop to eye as needed (for dry eyes).    Marland Kitchen oxyCODONE-acetaminophen (PERCOCET) 5-325 MG tablet Take 1 tablet by mouth every 4 (four) hours as needed for severe pain. 20 tablet 0  . promethazine (PHENERGAN) 25 MG tablet Take 25 mg by mouth.    . ranitidine (ZANTAC) 300 MG tablet Take 1 tablet (300 mg total) by mouth 2 (two) times daily. (Patient taking differently: Take 300 mg by mouth daily. ) 60 tablet 0  . ranolazine (RANEXA) 500 MG 12 hr tablet Take 500 mg by mouth 2 (two) times daily.    . solifenacin (VESICARE) 10 MG tablet Take 10 mg by mouth at bedtime.    Marland Kitchen tiotropium (SPIRIVA) 18 MCG inhalation capsule Place 18 mcg into inhaler and inhale  at bedtime.    . topiramate (TOPAMAX) 100 MG tablet Take 1 tablet (100 mg total) by mouth 2 (two) times daily. 60 tablet 0  . torsemide (DEMADEX) 20 MG tablet Take 20 mg by mouth daily.     No current facility-administered medications for this visit.     Medical Decision Making:  Established Problem, Stable/Improving (1), Review of Medication Regimen & Side Effects (2) and Review of New Medication or Change in Dosage (2)  Treatment Plan Summary:Medication management and Plan   Discussed with patient about her medications. I will adjust her medications as follows Lamotrigine 200 mg daily BuSpar 15 mg by mouth 3 times a day Discontinue Lexapro Topamax 100 mg by mouth twice a day Continue Neurontin 4 times daily as prescribed. She will follow-up in one month or earlier depending on her symptoms She agreed with the plan about the medication adjustment and we discussed about the side effects in detail and she demonstrated understanding.   More than 50% of the time spent in psychoeducation, counseling and coordination of care.    This note was generated in part or whole with voice recognition software. Voice regonition is usually quite accurate but there are transcription errors that can and very often do occur. I apologize for any typographical errors that were not detected and corrected. Rainey Pines, MD  09/21/2015, 2:37 PM

## 2015-09-22 ENCOUNTER — Telehealth: Payer: Self-pay | Admitting: Psychiatry

## 2015-09-22 ENCOUNTER — Other Ambulatory Visit: Payer: Self-pay | Admitting: Psychiatry

## 2015-09-22 ENCOUNTER — Telehealth: Payer: Self-pay

## 2015-09-22 NOTE — Telephone Encounter (Signed)
the letter is ready for you to sign.  please check you basket.

## 2015-09-22 NOTE — Telephone Encounter (Signed)
Will send a letter. Thanks

## 2015-09-22 NOTE — Telephone Encounter (Signed)
pt called states she was seen yesterday and that dr. Gretel Acre forgot to send in the duspar.  asked if s 90 day supply could be sent in.

## 2015-09-22 NOTE — Telephone Encounter (Signed)
called in rx buspar 15mg  take 1 tablet 3 times daily #270 with no refills.

## 2015-09-23 ENCOUNTER — Ambulatory Visit
Admission: RE | Admit: 2015-09-23 | Discharge: 2015-09-23 | Disposition: A | Discharge status: Home / Self Care | Payer: Medicare HMO | Source: Ambulatory Visit | Attending: Specialist | Admitting: Specialist

## 2015-09-23 DIAGNOSIS — M1712 Unilateral primary osteoarthritis, left knee: Secondary | ICD-10-CM | POA: Diagnosis present

## 2015-09-23 DIAGNOSIS — R938 Abnormal findings on diagnostic imaging of other specified body structures: Secondary | ICD-10-CM | POA: Insufficient documentation

## 2015-09-23 DIAGNOSIS — M23301 Other meniscus derangements, unspecified lateral meniscus, left knee: Secondary | ICD-10-CM | POA: Insufficient documentation

## 2015-09-23 NOTE — Telephone Encounter (Signed)
spoke with patient , pt wants letter mailed. letter was put in the mail today

## 2015-09-26 ENCOUNTER — Other Ambulatory Visit: Payer: Self-pay

## 2015-09-26 NOTE — Telephone Encounter (Signed)
received a fax from [place] requesting a 90 day supply for patient medication. pt was seen on  09-21-15 next appt  11-02-15. Need 90 day supplies for buspirone , topiramate, lamotrigine and gabapentin.

## 2015-10-12 DIAGNOSIS — S86912A Strain of unspecified muscle(s) and tendon(s) at lower leg level, left leg, initial encounter: Secondary | ICD-10-CM | POA: Insufficient documentation

## 2015-10-12 DIAGNOSIS — S8002XA Contusion of left knee, initial encounter: Secondary | ICD-10-CM | POA: Insufficient documentation

## 2015-10-20 NOTE — Telephone Encounter (Signed)
called pt pharmacy the only medication that did not get done was the neurontin so I oked a 90 day supply with no refills

## 2015-11-02 ENCOUNTER — Ambulatory Visit (INDEPENDENT_AMBULATORY_CARE_PROVIDER_SITE_OTHER): Payer: Medicare HMO | Admitting: Psychiatry

## 2015-11-02 ENCOUNTER — Encounter: Payer: Self-pay | Admitting: Psychiatry

## 2015-11-02 VITALS — BP 112/77 | HR 109 | Temp 98.4°F | Ht 64.0 in | Wt 228.0 lb

## 2015-11-02 DIAGNOSIS — F4310 Post-traumatic stress disorder, unspecified: Secondary | ICD-10-CM

## 2015-11-02 DIAGNOSIS — F3160 Bipolar disorder, current episode mixed, unspecified: Secondary | ICD-10-CM

## 2015-11-02 MED ORDER — TOPIRAMATE 100 MG PO TABS: 100.0000 mg | ORAL_TABLET | Freq: Two times a day (BID) | ORAL | 1 refills | Status: DC

## 2015-11-02 MED ORDER — BUSPIRONE HCL 15 MG PO TABS: 15.0000 mg | ORAL_TABLET | Freq: Three times a day (TID) | ORAL | 0 refills | Status: DC

## 2015-11-02 MED ORDER — GABAPENTIN 300 MG PO CAPS: ORAL_CAPSULE | ORAL | 0 refills | Status: DC

## 2015-11-02 MED ORDER — LAMOTRIGINE 200 MG PO TABS: 200.0000 mg | ORAL_TABLET | Freq: Every day | ORAL | 1 refills | Status: DC

## 2015-11-02 NOTE — Progress Notes (Signed)
BH MD/PA/NP OP Progress Note  11/02/2015 2:02 PM Marie Clarke  MRN:  [id number]  Subjective:  Patient is a 45 year old female with history of bipolar disorder who presented for follow-up appointment.  She was walking with a walker as she is still recuperating from her knee injury. Patient reported that she has an appointment at [facility] on Monday. She reported that she is not taking any medications at this time except for the ibuprofen. It is not strong enough. She is very happy as she is become her own payee and is able to manage her finances. Patient reported that she is helping her uncle and aunt at this time. She appeared calm and collected during the interview. She was discussing about the medications for her neuropathy as she feels that the gabapentin is not helping. She appears alert during the interview. She denied having any mood symptoms anger anxiety or paranoia. She denied having any social cycle ideations or plans.       Chief Complaint:  Chief Complaint    Follow-up; Medication Refill     Visit Diagnosis:   No diagnosis found.  Past Medical History:  Past Medical History:  Diagnosis Date  . Anemia   . Anxiety   . Asthma   . Bipolar disorder ([place])   . CAD (coronary artery disease) unk  . CHF (congestive heart failure) ([place])   . COPD (chronic obstructive pulmonary disease) ([place])   . Depression unk  . Diabetes mellitus without complication ([place])   . Diabetes mellitus, type II ([place])   . Drug overdose   . GERD (gastroesophageal reflux disease)   . Headache   . Hyperlipidemia   . Hypertension   . Left leg pain 04/29/2014  . MI (myocardial infarction) ([place])   . Muscle ache 09/16/2014  . Osteoporosis   . Overactive bladder   . Pancreatitis unk  . Reflex sympathetic dystrophy   . Renal insufficiency   . Sleep apnea    pt reported on 2/6/7 she currently is not using CPAP  . Sleep apnea   . Stroke ([place])   . Thyroid disease   . TIA (transient ischemic attack) unk  .  TIA (transient ischemic attack)     Past Surgical History:  Procedure Laterality Date  . ABDOMINAL HYSTERECTOMY    . prolapse rectum surgery N/A July 2016  . TONSILLECTOMY     Family History:  Family History  Problem Relation Age of Onset  . Diabetes Mellitus II Mother   . CAD Mother   . Sleep apnea Mother   . Osteoarthritis Mother   . Osteoporosis Mother   . Anxiety disorder Mother   . Depression Mother   . Bipolar disorder Mother   . Bipolar disorder Father   . Hypertension Father   . Depression Father   . Anxiety disorder Father   . Post-traumatic stress disorder Sister    Social History:  Social History   Social History  . Marital status: Single    Spouse name: N/A  . Number of children: N/A  . Years of education: N/A   Social History Main Topics  . Smoking status: Current Every Day Smoker    Packs/day: 0.50    Types: Cigarettes    Last attempt to quit: 09/09/2005  . Smokeless tobacco: Never Used  . Alcohol use No  . Drug use: No  . Sexual activity: Not Currently   Other Topics Concern  . None   Social History Narrative  . None  Additional History:   Assessment:   Musculoskeletal: Strength & Muscle Tone: within normal limits Gait & Station: Slow and ambulates with a cane Patient leans: N/A  Psychiatric Specialty Exam: Medication Refill     Review of Systems  Musculoskeletal: Positive for back pain and joint pain.  Psychiatric/Behavioral: Negative for hallucinations, memory loss, substance abuse and suicidal ideas. Depression: she's reporting some day-to-day fluctuation in her mood from depressed to being upbeat. The patient has insomnia.   All other systems reviewed and are negative.   Blood pressure 112/77, pulse (!) 109, temperature 98.4 F (36.9 C), temperature source Oral, height 5\' 4"  (1.626 m), weight 228 lb (103.4 kg).Body mass index is 39.14 kg/m.  General Appearance: Well Groomed  Eye Contact:  Good  Speech:  Slow  Volume:  Normal   Mood:  Anxious and Depressed  Affect:  Congruent  Thought Process:  Goal Directed  Orientation:  Full (Time, Place, and Person)  Thought Content:  Negative  Suicidal Thoughts:  No  Homicidal Thoughts:  No  Memory:  Immediate;   Good Recent;   Good Remote;   Good  Judgement:  Good  Insight:  Fair  Psychomotor Activity:  Normal  Concentration:  Good  Recall:  Good  Fund of Knowledge: Good  Language: Good  Akathisia:  Negative  Handed:  Right unknown   AIMS (if indicated):  N/A  Assets:  Desire for Improvement Social Support  ADL's:  Intact  Cognition: WNL  Sleep:  good   Is the patient at risk to self?  No. Has the patient been a risk to self in the past 6 months?  No. Has the patient been a risk to self within the distant past?  Yes.   Is the patient a risk to others?  No. Has the patient been a risk to others in the past 6 months?  No. Has the patient been a risk to others within the distant past?  No.  Current Medications: Current Outpatient Prescriptions  Medication Sig Dispense Refill  . albuterol (PROVENTIL HFA;VENTOLIN HFA) 108 (90 BASE) MCG/ACT inhaler Inhale 2 puffs into the lungs every 4 (four) hours as needed for wheezing or shortness of breath.    Marland Kitchen aspirin EC 81 MG tablet Take 81 mg by mouth daily at 12 noon.    Marland Kitchen atorvastatin (LIPITOR) 40 MG tablet Take 40 mg by mouth at bedtime.     Marland Kitchen atorvastatin (LIPITOR) 40 MG tablet Take by mouth.    Marland Kitchen azelastine (ASTELIN) 0.1 % nasal spray Place 1 spray into both nostrils daily.    . busPIRone (BUSPAR) 15 MG tablet Take 1 tablet (15 mg total) by mouth 3 (three) times daily. 90 tablet 0  . cyclobenzaprine (FLEXERIL) 5 MG tablet Take 1 tablet (5 mg total) by mouth every 8 (eight) hours as needed for muscle spasms. 21 tablet 0  . cyclobenzaprine (FLEXERIL) 5 MG tablet Take by mouth.    . fluticasone-salmeterol (ADVAIR HFA) 230-21 MCG/ACT inhaler Inhale 2 puffs into the lungs 2 (two) times daily.    .  fluticasone-salmeterol (ADVAIR HFA) 230-21 MCG/ACT inhaler Inhale into the lungs.    . furosemide (LASIX) 40 MG tablet Take by mouth.    . gabapentin (NEURONTIN) 300 MG capsule QID 120 capsule 0  . glipiZIDE (GLUCOTROL XL) 5 MG 24 hr tablet Take by mouth.    Marland Kitchen KLOR-CON M20 20 MEQ tablet     . lamoTRIgine (LAMICTAL) 200 MG tablet Take 1 tablet (200 mg total) by  mouth daily. 90 tablet 1  . levocetirizine (XYZAL) 5 MG tablet Take 5 mg by mouth at bedtime.     Marland Kitchen levocetirizine (XYZAL) 5 MG tablet Take by mouth.    . levothyroxine (SYNTHROID, LEVOTHROID) 200 MCG tablet Take 200 mcg by mouth daily.     . meloxicam (MOBIC) 15 MG tablet     . metoprolol succinate (TOPROL-XL) 25 MG 24 hr tablet Take by mouth.    . montelukast (SINGULAIR) 10 MG tablet Take 10 mg by mouth daily.     . Multiple Vitamin (MULTIVITAMIN WITH MINERALS) TABS tablet Take 1 tablet by mouth daily.    . naproxen (NAPROSYN) 500 MG tablet Take 1 tablet (500 mg total) by mouth 2 (two) times daily with a meal. 14 tablet 0  . Olopatadine HCl (PATADAY) 0.2 % SOLN Apply 1 drop to eye as needed (for dry eyes).    Marland Kitchen oxyCODONE-acetaminophen (PERCOCET) 5-325 MG tablet Take 1 tablet by mouth every 4 (four) hours as needed for severe pain. 20 tablet 0  . promethazine (PHENERGAN) 25 MG tablet Take 25 mg by mouth.    . promethazine (PHENERGAN) 25 MG tablet Take by mouth.    . ranitidine (ZANTAC) 300 MG tablet Take 1 tablet (300 mg total) by mouth 2 (two) times daily. (Patient taking differently: Take 300 mg by mouth daily. ) 60 tablet 0  . ranolazine (RANEXA) 500 MG 12 hr tablet Take 500 mg by mouth 2 (two) times daily.    . solifenacin (VESICARE) 10 MG tablet Take 10 mg by mouth at bedtime.    Marland Kitchen tiotropium (SPIRIVA) 18 MCG inhalation capsule Place 18 mcg into inhaler and inhale at bedtime.    . topiramate (TOPAMAX) 100 MG tablet Take 1 tablet (100 mg total) by mouth 2 (two) times daily. 180 tablet 0  . torsemide (DEMADEX) 20 MG tablet Take 20 mg  by mouth daily.     No current facility-administered medications for this visit.     Medical Decision Making:  Established Problem, Stable/Improving (1), Review of Medication Regimen & Side Effects (2) and Review of New Medication or Change in Dosage (2)  Treatment Plan Summary:Medication management and Plan   Discussed with patient about her medications. I will adjust her medications as follows Lamotrigine 200 mg daily BuSpar 15 mg by mouth 3 times a day Topamax 100 mg by mouth twice a day Continue Neurontin 4 times daily as prescribed. Medications sent to the [facility]  home delivery pharmacy She will follow-up in two month or earlier depending on her symptoms She agreed with the plan about the medication adjustment and we discussed about the side effects in detail and she demonstrated understanding.   More than 50% of the time spent in psychoeducation, counseling and coordination of care.    This note was generated in part or whole with voice recognition software. Voice regonition is usually quite accurate but there are transcription errors that can and very often do occur. I apologize for any typographical errors that were not detected and corrected. Rainey Pines, MD  11/02/2015, 2:02 PM

## 2015-12-06 ENCOUNTER — Other Ambulatory Visit: Payer: Self-pay | Admitting: Family Medicine

## 2015-12-06 DIAGNOSIS — Z1231 Encounter for screening mammogram for malignant neoplasm of breast: Secondary | ICD-10-CM

## 2015-12-14 DIAGNOSIS — I2510 Atherosclerotic heart disease of native coronary artery without angina pectoris: Secondary | ICD-10-CM | POA: Diagnosis not present

## 2015-12-15 DIAGNOSIS — I2510 Atherosclerotic heart disease of native coronary artery without angina pectoris: Secondary | ICD-10-CM | POA: Diagnosis not present

## 2015-12-16 DIAGNOSIS — I2510 Atherosclerotic heart disease of native coronary artery without angina pectoris: Secondary | ICD-10-CM | POA: Diagnosis not present

## 2015-12-19 DIAGNOSIS — I2510 Atherosclerotic heart disease of native coronary artery without angina pectoris: Secondary | ICD-10-CM | POA: Diagnosis not present

## 2015-12-20 DIAGNOSIS — I2510 Atherosclerotic heart disease of native coronary artery without angina pectoris: Secondary | ICD-10-CM | POA: Diagnosis not present

## 2015-12-21 DIAGNOSIS — R05 Cough: Secondary | ICD-10-CM | POA: Diagnosis not present

## 2015-12-21 DIAGNOSIS — J441 Chronic obstructive pulmonary disease with (acute) exacerbation: Secondary | ICD-10-CM | POA: Diagnosis not present

## 2015-12-21 DIAGNOSIS — I2510 Atherosclerotic heart disease of native coronary artery without angina pectoris: Secondary | ICD-10-CM | POA: Diagnosis not present

## 2015-12-21 DIAGNOSIS — F319 Bipolar disorder, unspecified: Secondary | ICD-10-CM | POA: Diagnosis not present

## 2015-12-21 DIAGNOSIS — E1121 Type 2 diabetes mellitus with diabetic nephropathy: Secondary | ICD-10-CM | POA: Diagnosis not present

## 2015-12-22 DIAGNOSIS — I2510 Atherosclerotic heart disease of native coronary artery without angina pectoris: Secondary | ICD-10-CM | POA: Diagnosis not present

## 2015-12-23 DIAGNOSIS — I2510 Atherosclerotic heart disease of native coronary artery without angina pectoris: Secondary | ICD-10-CM | POA: Diagnosis not present

## 2015-12-23 DIAGNOSIS — R05 Cough: Secondary | ICD-10-CM | POA: Diagnosis not present

## 2015-12-24 DIAGNOSIS — I2510 Atherosclerotic heart disease of native coronary artery without angina pectoris: Secondary | ICD-10-CM | POA: Diagnosis not present

## 2015-12-25 DIAGNOSIS — I2510 Atherosclerotic heart disease of native coronary artery without angina pectoris: Secondary | ICD-10-CM | POA: Diagnosis not present

## 2015-12-26 DIAGNOSIS — I2510 Atherosclerotic heart disease of native coronary artery without angina pectoris: Secondary | ICD-10-CM | POA: Diagnosis not present

## 2015-12-27 DIAGNOSIS — I2510 Atherosclerotic heart disease of native coronary artery without angina pectoris: Secondary | ICD-10-CM | POA: Diagnosis not present

## 2015-12-28 ENCOUNTER — Telehealth: Payer: Self-pay

## 2015-12-28 ENCOUNTER — Ambulatory Visit: Payer: Medicare HMO | Admitting: Pain Medicine

## 2015-12-28 DIAGNOSIS — I2510 Atherosclerotic heart disease of native coronary artery without angina pectoris: Secondary | ICD-10-CM | POA: Diagnosis not present

## 2015-12-28 DIAGNOSIS — F3160 Bipolar disorder, current episode mixed, unspecified: Secondary | ICD-10-CM

## 2015-12-28 MED ORDER — LAMOTRIGINE 200 MG PO TABS: 200.0000 mg | ORAL_TABLET | Freq: Every day | ORAL | 0 refills | Status: DC

## 2015-12-28 MED ORDER — TOPIRAMATE 100 MG PO TABS: 100.0000 mg | ORAL_TABLET | Freq: Two times a day (BID) | ORAL | 0 refills | Status: DC

## 2015-12-28 MED ORDER — GABAPENTIN 300 MG PO CAPS: 300.0000 mg | ORAL_CAPSULE | Freq: Four times a day (QID) | ORAL | 0 refills | Status: DC

## 2015-12-28 MED ORDER — BUSPIRONE HCL 15 MG PO TABS: 15.0000 mg | ORAL_TABLET | Freq: Three times a day (TID) | ORAL | 0 refills | Status: DC

## 2015-12-28 NOTE — Telephone Encounter (Signed)
Medication management - Left patient a message with request she call our office back so could question if previous prescriptions sent to the correct mailing pharmacy as our office keeps receiving refill requests from [facility] and orders went to [facility].  Patient called back and stated she now gets her medication from [facility] and requests recent orders from Dr. Gretel Acre for Lamictal and Buspar from 11/02/15 be cancelled from [facility] and reordered with [facility].  Informed patient this nurse would request from Dr. Gretel Acre and will contact her back once corrected.

## 2015-12-28 NOTE — Telephone Encounter (Signed)
Okay. To send to [facility]

## 2015-12-28 NOTE — Telephone Encounter (Signed)
Medication management - Telephone call back with patient to question if all medication orders from Dr. Gretel Acre needed to be resent to her new [place] and patient stated yes.  Dr. Gretel Acre approved to send all these and to cancel from [facility].  New 90 day orders for patient's Gabapentin, Lamictal, Buspar and Topamax e-scribed to [facility] as requested by patient and contacted Lattie Haw, pharmacist at [facility] to cancel orders e-scribed 11/02/15 by Dr. Gretel Acre.  Pharmacist stated patient was no longer eligible to get medication from them, that the orders were not sent and canceled orders as requested.

## 2015-12-29 DIAGNOSIS — I2510 Atherosclerotic heart disease of native coronary artery without angina pectoris: Secondary | ICD-10-CM | POA: Diagnosis not present

## 2015-12-30 DIAGNOSIS — I2510 Atherosclerotic heart disease of native coronary artery without angina pectoris: Secondary | ICD-10-CM | POA: Diagnosis not present

## 2015-12-31 DIAGNOSIS — I2510 Atherosclerotic heart disease of native coronary artery without angina pectoris: Secondary | ICD-10-CM | POA: Diagnosis not present

## 2016-01-01 DIAGNOSIS — I2510 Atherosclerotic heart disease of native coronary artery without angina pectoris: Secondary | ICD-10-CM | POA: Diagnosis not present

## 2016-01-02 ENCOUNTER — Encounter: Payer: Self-pay | Admitting: Psychiatry

## 2016-01-02 ENCOUNTER — Ambulatory Visit (INDEPENDENT_AMBULATORY_CARE_PROVIDER_SITE_OTHER): Payer: Commercial Managed Care - HMO | Admitting: Psychiatry

## 2016-01-02 VITALS — BP 114/76 | HR 111 | Ht 64.0 in | Wt 224.6 lb

## 2016-01-02 DIAGNOSIS — F3160 Bipolar disorder, current episode mixed, unspecified: Secondary | ICD-10-CM | POA: Diagnosis not present

## 2016-01-02 DIAGNOSIS — I2510 Atherosclerotic heart disease of native coronary artery without angina pectoris: Secondary | ICD-10-CM | POA: Diagnosis not present

## 2016-01-02 DIAGNOSIS — F4310 Post-traumatic stress disorder, unspecified: Secondary | ICD-10-CM | POA: Diagnosis not present

## 2016-01-02 MED ORDER — TRAZODONE HCL 50 MG PO TABS: 50.0000 mg | ORAL_TABLET | Freq: Every day | ORAL | 0 refills | Status: DC

## 2016-01-02 NOTE — Progress Notes (Signed)
BH MD/PA/NP OP Progress Note  01/02/2016 1:46 PM Marie Clarke  MRN:  [id number]  Subjective:  Patient is a 45 year old female with history of bipolar disorder who presented for follow-up appointment.  She was walking with a walker as she is still recuperating from her knee injury. Patient reported that she owing for vocational rehabilitation and is trying to find a job. Patient reported that she also wants to get her license back which she has given up in 2005. Patient reported that she is planning to talk to her primary care physician. I advised her that she should also talked with a neurologist as she has history of a stroke in the past and she agreed with the plan. Patient reported that she has been compliant with her medication. She lives with her aunt and her disabled daughter. She is spending time with them. Patient reported that she has been helping her family members. She appeared calm and alert during the interview. She denied having any side effects of the medication. Patient denied having any perceptual disturbances.   She reported that she is trying to take care of her health and has been having  sleep issues. We discussed about starting her on trazodone and she agreed with the plan. She reported that she has taken it in the past. She will try half to 1 pill at bedtime.   Chief Complaint:  Chief Complaint    Follow-up     Visit Diagnosis:   No diagnosis found.  Past Medical History:  Past Medical History:  Diagnosis Date  . Anemia   . Anxiety   . Asthma   . Bipolar disorder ([place])   . CAD (coronary artery disease) unk  . CHF (congestive heart failure) ([place])   . COPD (chronic obstructive pulmonary disease) ([place])   . Depression unk  . Diabetes mellitus without complication ([place])   . Diabetes mellitus, type II ([place])   . Drug overdose   . GERD (gastroesophageal reflux disease)   . Headache   . Hyperlipidemia   . Hypertension   . Left leg pain 04/29/2014  . MI (myocardial  infarction)   . Muscle ache 09/16/2014  . Osteoporosis   . Overactive bladder   . Pancreatitis unk  . Reflex sympathetic dystrophy   . Renal insufficiency   . Sleep apnea    pt reported on 2/6/7 she currently is not using CPAP  . Sleep apnea   . Stroke ([place])   . Thyroid disease   . TIA (transient ischemic attack) unk  . TIA (transient ischemic attack)     Past Surgical History:  Procedure Laterality Date  . ABDOMINAL HYSTERECTOMY    . prolapse rectum surgery N/A July 2016  . TONSILLECTOMY     Family History:  Family History  Problem Relation Age of Onset  . Diabetes Mellitus II Mother   . CAD Mother   . Sleep apnea Mother   . Osteoarthritis Mother   . Osteoporosis Mother   . Anxiety disorder Mother   . Depression Mother   . Bipolar disorder Mother   . Bipolar disorder Father   . Hypertension Father   . Depression Father   . Anxiety disorder Father   . Post-traumatic stress disorder Sister    Social History:  Social History   Social History  . Marital status: Single    Spouse name: N/A  . Number of children: N/A  . Years of education: N/A   Social History Main Topics  . Smoking status:  Current Every Day Smoker    Packs/day: 1.00    Types: Cigarettes    Last attempt to quit: 09/09/2005  . Smokeless tobacco: Never Used  . Alcohol use No  . Drug use: No  . Sexual activity: Not Currently   Other Topics Concern  . None   Social History Narrative  . None   Additional History:   Assessment:   Musculoskeletal: Strength & Muscle Tone: within normal limits Gait & Station: Slow and ambulates with a cane Patient leans: N/A  Psychiatric Specialty Exam: Medication Refill     Review of Systems  Musculoskeletal: Positive for back pain and joint pain.  Psychiatric/Behavioral: Negative for hallucinations, memory loss, substance abuse and suicidal ideas. Depression: she's reporting some day-to-day fluctuation in her mood from depressed to being upbeat. The  patient has insomnia.   All other systems reviewed and are negative.   Blood pressure 114/76, pulse (!) 111, height 5\' 4"  (1.626 m), weight 224 lb 9.6 oz (101.9 kg).Body mass index is 38.55 kg/m.  General Appearance: Well Groomed  Eye Contact:  Good  Speech:  Slow  Volume:  Normal  Mood:  Anxious and Depressed  Affect:  Congruent  Thought Process:  Goal Directed  Orientation:  Full (Time, Place, and Person)  Thought Content:  Negative  Suicidal Thoughts:  No  Homicidal Thoughts:  No  Memory:  Immediate;   Good Recent;   Good Remote;   Good  Judgement:  Good  Insight:  Fair  Psychomotor Activity:  Normal  Concentration:  Good  Recall:  Good  Fund of Knowledge: Good  Language: Good  Akathisia:  Negative  Handed:  Right unknown   AIMS (if indicated):  N/A  Assets:  Desire for Improvement Social Support  ADL's:  Intact  Cognition: WNL  Sleep:  good   Is the patient at risk to self?  No. Has the patient been a risk to self in the past 6 months?  No. Has the patient been a risk to self within the distant past?  Yes.   Is the patient a risk to others?  No. Has the patient been a risk to others in the past 6 months?  No. Has the patient been a risk to others within the distant past?  No.  Current Medications: Current Outpatient Prescriptions  Medication Sig Dispense Refill  . albuterol (PROVENTIL HFA;VENTOLIN HFA) 108 (90 BASE) MCG/ACT inhaler Inhale 2 puffs into the lungs every 4 (four) hours as needed for wheezing or shortness of breath.    Marland Kitchen aspirin EC 81 MG tablet Take 81 mg by mouth daily at 12 noon.    Marland Kitchen atorvastatin (LIPITOR) 40 MG tablet Take 40 mg by mouth at bedtime.     Marland Kitchen atorvastatin (LIPITOR) 40 MG tablet Take by mouth.    Marland Kitchen azelastine (ASTELIN) 0.1 % nasal spray Place 1 spray into both nostrils daily.    . busPIRone (BUSPAR) 15 MG tablet Take 1 tablet (15 mg total) by mouth 3 (three) times daily. 270 tablet 0  . cyclobenzaprine (FLEXERIL) 5 MG tablet Take 1  tablet (5 mg total) by mouth every 8 (eight) hours as needed for muscle spasms. 21 tablet 0  . cyclobenzaprine (FLEXERIL) 5 MG tablet Take by mouth.    . fluticasone-salmeterol (ADVAIR HFA) 230-21 MCG/ACT inhaler Inhale 2 puffs into the lungs 2 (two) times daily.    . fluticasone-salmeterol (ADVAIR HFA) 230-21 MCG/ACT inhaler Inhale into the lungs.    . furosemide (LASIX) 40 MG  tablet Take by mouth.    . gabapentin (NEURONTIN) 300 MG capsule Take 1 capsule (300 mg total) by mouth 4 (four) times daily. 360 capsule 0  . glipiZIDE (GLUCOTROL XL) 5 MG 24 hr tablet Take by mouth.    Marland Kitchen KLOR-CON M20 20 MEQ tablet     . lamoTRIgine (LAMICTAL) 200 MG tablet Take 1 tablet (200 mg total) by mouth daily. 90 tablet 0  . levocetirizine (XYZAL) 5 MG tablet Take 5 mg by mouth at bedtime.     Marland Kitchen levocetirizine (XYZAL) 5 MG tablet Take by mouth.    . levothyroxine (SYNTHROID, LEVOTHROID) 200 MCG tablet Take 200 mcg by mouth daily.     . meloxicam (MOBIC) 15 MG tablet     . metoprolol succinate (TOPROL-XL) 25 MG 24 hr tablet Take by mouth.    . montelukast (SINGULAIR) 10 MG tablet Take 10 mg by mouth daily.     . Multiple Vitamin (MULTIVITAMIN WITH MINERALS) TABS tablet Take 1 tablet by mouth daily.    . naproxen (NAPROSYN) 500 MG tablet Take 1 tablet (500 mg total) by mouth 2 (two) times daily with a meal. 14 tablet 0  . Olopatadine HCl (PATADAY) 0.2 % SOLN Apply 1 drop to eye as needed (for dry eyes).    Marland Kitchen oxyCODONE-acetaminophen (PERCOCET) 5-325 MG tablet Take 1 tablet by mouth every 4 (four) hours as needed for severe pain. 20 tablet 0  . promethazine (PHENERGAN) 25 MG tablet Take 25 mg by mouth.    . promethazine (PHENERGAN) 25 MG tablet Take by mouth.    . ranitidine (ZANTAC) 300 MG tablet Take 1 tablet (300 mg total) by mouth 2 (two) times daily. (Patient taking differently: Take 300 mg by mouth daily. ) 60 tablet 0  . ranolazine (RANEXA) 500 MG 12 hr tablet Take 500 mg by mouth 2 (two) times daily.    .  solifenacin (VESICARE) 10 MG tablet Take 10 mg by mouth at bedtime.    Marland Kitchen tiotropium (SPIRIVA) 18 MCG inhalation capsule Place 18 mcg into inhaler and inhale at bedtime.    . topiramate (TOPAMAX) 100 MG tablet Take 1 tablet (100 mg total) by mouth 2 (two) times daily. 180 tablet 0  . torsemide (DEMADEX) 20 MG tablet Take 20 mg by mouth daily.     No current facility-administered medications for this visit.     Medical Decision Making:  Established Problem, Stable/Improving (1), Review of Medication Regimen & Side Effects (2) and Review of New Medication or Change in Dosage (2)  Treatment Plan Summary:Medication management and Plan   Discussed with patient about her medications. I will adjust her medications as follows Lamotrigine 200 mg daily BuSpar 15 mg by mouth 3 times a day Topamax 100 mg by mouth twice a day Continue Neurontin 3  times daily as prescribed. Medications sent to the mail order home- patient has supply at this time.  I will start her on trazodone 50 mg by mouth daily at bedtime when necessary for insomnia. It was sent to the [facility].  Patient will follow up in 1 month or earlier depending on her symptoms    More than 50% of the time spent in psychoeducation, counseling and coordination of care.    This note was generated in part or whole with voice recognition software. Voice regonition is usually quite accurate but there are transcription errors that can and very often do occur. I apologize for any typographical errors that were not detected and corrected. Marland Kitchen  Rainey Pines, MD  01/02/2016, 1:46 PM

## 2016-01-03 ENCOUNTER — Ambulatory Visit: Payer: Medicare HMO | Admitting: Pain Medicine

## 2016-01-03 DIAGNOSIS — I2510 Atherosclerotic heart disease of native coronary artery without angina pectoris: Secondary | ICD-10-CM | POA: Diagnosis not present

## 2016-01-04 ENCOUNTER — Ambulatory Visit: Payer: Medicare HMO

## 2016-01-04 DIAGNOSIS — J020 Streptococcal pharyngitis: Secondary | ICD-10-CM | POA: Diagnosis not present

## 2016-01-04 DIAGNOSIS — H6091 Unspecified otitis externa, right ear: Secondary | ICD-10-CM | POA: Diagnosis not present

## 2016-01-04 DIAGNOSIS — I2510 Atherosclerotic heart disease of native coronary artery without angina pectoris: Secondary | ICD-10-CM | POA: Diagnosis not present

## 2016-01-05 DIAGNOSIS — I2510 Atherosclerotic heart disease of native coronary artery without angina pectoris: Secondary | ICD-10-CM | POA: Diagnosis not present

## 2016-01-06 DIAGNOSIS — I2510 Atherosclerotic heart disease of native coronary artery without angina pectoris: Secondary | ICD-10-CM | POA: Diagnosis not present

## 2016-01-07 DIAGNOSIS — I2510 Atherosclerotic heart disease of native coronary artery without angina pectoris: Secondary | ICD-10-CM | POA: Diagnosis not present

## 2016-01-08 DIAGNOSIS — I2510 Atherosclerotic heart disease of native coronary artery without angina pectoris: Secondary | ICD-10-CM | POA: Diagnosis not present

## 2016-01-09 DIAGNOSIS — I2510 Atherosclerotic heart disease of native coronary artery without angina pectoris: Secondary | ICD-10-CM | POA: Diagnosis not present

## 2016-01-10 DIAGNOSIS — I2510 Atherosclerotic heart disease of native coronary artery without angina pectoris: Secondary | ICD-10-CM | POA: Diagnosis not present

## 2016-01-11 DIAGNOSIS — I2510 Atherosclerotic heart disease of native coronary artery without angina pectoris: Secondary | ICD-10-CM | POA: Diagnosis not present

## 2016-01-12 DIAGNOSIS — I2510 Atherosclerotic heart disease of native coronary artery without angina pectoris: Secondary | ICD-10-CM | POA: Diagnosis not present

## 2016-01-13 ENCOUNTER — Other Ambulatory Visit: Payer: Self-pay | Admitting: Psychiatry

## 2016-01-13 DIAGNOSIS — F3160 Bipolar disorder, current episode mixed, unspecified: Secondary | ICD-10-CM

## 2016-01-16 DIAGNOSIS — I2510 Atherosclerotic heart disease of native coronary artery without angina pectoris: Secondary | ICD-10-CM | POA: Diagnosis not present

## 2016-01-17 DIAGNOSIS — I2510 Atherosclerotic heart disease of native coronary artery without angina pectoris: Secondary | ICD-10-CM | POA: Diagnosis not present

## 2016-01-18 DIAGNOSIS — I2510 Atherosclerotic heart disease of native coronary artery without angina pectoris: Secondary | ICD-10-CM | POA: Diagnosis not present

## 2016-01-19 DIAGNOSIS — R000 Tachycardia, unspecified: Secondary | ICD-10-CM | POA: Diagnosis not present

## 2016-01-19 DIAGNOSIS — J040 Acute laryngitis: Secondary | ICD-10-CM | POA: Diagnosis not present

## 2016-01-19 DIAGNOSIS — R600 Localized edema: Secondary | ICD-10-CM | POA: Diagnosis not present

## 2016-01-19 DIAGNOSIS — I2510 Atherosclerotic heart disease of native coronary artery without angina pectoris: Secondary | ICD-10-CM | POA: Diagnosis not present

## 2016-01-20 DIAGNOSIS — I2510 Atherosclerotic heart disease of native coronary artery without angina pectoris: Secondary | ICD-10-CM | POA: Diagnosis not present

## 2016-01-23 ENCOUNTER — Other Ambulatory Visit: Payer: Self-pay

## 2016-01-23 DIAGNOSIS — I2510 Atherosclerotic heart disease of native coronary artery without angina pectoris: Secondary | ICD-10-CM | POA: Diagnosis not present

## 2016-01-23 MED ORDER — TRAZODONE HCL 50 MG PO TABS: 50.0000 mg | ORAL_TABLET | Freq: Every day | ORAL | 0 refills | Status: DC

## 2016-01-23 NOTE — Telephone Encounter (Signed)
received a fax requesting a 90 day supply of the trazodone 50mg 

## 2016-01-23 NOTE — Telephone Encounter (Signed)
pt was last seen on  11-20 next appt 02-29-16 pt wants a 90 day supply sent to [facility]

## 2016-01-23 NOTE — Telephone Encounter (Signed)
Med refilled.

## 2016-01-24 DIAGNOSIS — I2510 Atherosclerotic heart disease of native coronary artery without angina pectoris: Secondary | ICD-10-CM | POA: Diagnosis not present

## 2016-01-25 DIAGNOSIS — I2510 Atherosclerotic heart disease of native coronary artery without angina pectoris: Secondary | ICD-10-CM | POA: Diagnosis not present

## 2016-01-26 DIAGNOSIS — E039 Hypothyroidism, unspecified: Secondary | ICD-10-CM | POA: Diagnosis not present

## 2016-01-26 DIAGNOSIS — R32 Unspecified urinary incontinence: Secondary | ICD-10-CM | POA: Diagnosis not present

## 2016-01-26 DIAGNOSIS — I2510 Atherosclerotic heart disease of native coronary artery without angina pectoris: Secondary | ICD-10-CM | POA: Diagnosis not present

## 2016-01-27 DIAGNOSIS — I2510 Atherosclerotic heart disease of native coronary artery without angina pectoris: Secondary | ICD-10-CM | POA: Diagnosis not present

## 2016-01-28 DIAGNOSIS — I2510 Atherosclerotic heart disease of native coronary artery without angina pectoris: Secondary | ICD-10-CM | POA: Diagnosis not present

## 2016-01-29 DIAGNOSIS — I2510 Atherosclerotic heart disease of native coronary artery without angina pectoris: Secondary | ICD-10-CM | POA: Diagnosis not present

## 2016-01-30 DIAGNOSIS — I2510 Atherosclerotic heart disease of native coronary artery without angina pectoris: Secondary | ICD-10-CM | POA: Diagnosis not present

## 2016-01-31 DIAGNOSIS — I2510 Atherosclerotic heart disease of native coronary artery without angina pectoris: Secondary | ICD-10-CM | POA: Diagnosis not present

## 2016-02-01 DIAGNOSIS — I2510 Atherosclerotic heart disease of native coronary artery without angina pectoris: Secondary | ICD-10-CM | POA: Diagnosis not present

## 2016-02-02 DIAGNOSIS — I2510 Atherosclerotic heart disease of native coronary artery without angina pectoris: Secondary | ICD-10-CM | POA: Diagnosis not present

## 2016-02-03 DIAGNOSIS — I2510 Atherosclerotic heart disease of native coronary artery without angina pectoris: Secondary | ICD-10-CM | POA: Diagnosis not present

## 2016-02-04 DIAGNOSIS — I2510 Atherosclerotic heart disease of native coronary artery without angina pectoris: Secondary | ICD-10-CM | POA: Diagnosis not present

## 2016-02-05 DIAGNOSIS — I2510 Atherosclerotic heart disease of native coronary artery without angina pectoris: Secondary | ICD-10-CM | POA: Diagnosis not present

## 2016-02-07 DIAGNOSIS — I2510 Atherosclerotic heart disease of native coronary artery without angina pectoris: Secondary | ICD-10-CM | POA: Diagnosis not present

## 2016-02-08 DIAGNOSIS — I2510 Atherosclerotic heart disease of native coronary artery without angina pectoris: Secondary | ICD-10-CM | POA: Diagnosis not present

## 2016-02-09 DIAGNOSIS — B349 Viral infection, unspecified: Secondary | ICD-10-CM | POA: Diagnosis not present

## 2016-02-09 DIAGNOSIS — I2510 Atherosclerotic heart disease of native coronary artery without angina pectoris: Secondary | ICD-10-CM | POA: Diagnosis not present

## 2016-02-10 ENCOUNTER — Ambulatory Visit: Payer: Medicare HMO

## 2016-02-10 DIAGNOSIS — I2510 Atherosclerotic heart disease of native coronary artery without angina pectoris: Secondary | ICD-10-CM | POA: Diagnosis not present

## 2016-02-10 DIAGNOSIS — M1990 Unspecified osteoarthritis, unspecified site: Secondary | ICD-10-CM | POA: Diagnosis not present

## 2016-02-11 DIAGNOSIS — I2510 Atherosclerotic heart disease of native coronary artery without angina pectoris: Secondary | ICD-10-CM | POA: Diagnosis not present

## 2016-02-12 DIAGNOSIS — I2510 Atherosclerotic heart disease of native coronary artery without angina pectoris: Secondary | ICD-10-CM | POA: Diagnosis not present

## 2016-02-13 DIAGNOSIS — I2510 Atherosclerotic heart disease of native coronary artery without angina pectoris: Secondary | ICD-10-CM | POA: Diagnosis not present

## 2016-02-14 DIAGNOSIS — I2510 Atherosclerotic heart disease of native coronary artery without angina pectoris: Secondary | ICD-10-CM | POA: Diagnosis not present

## 2016-02-15 ENCOUNTER — Telehealth: Payer: Self-pay

## 2016-02-15 DIAGNOSIS — I2510 Atherosclerotic heart disease of native coronary artery without angina pectoris: Secondary | ICD-10-CM | POA: Diagnosis not present

## 2016-02-15 NOTE — Telephone Encounter (Signed)
pt called states that she has [place]. pt states that her insurance will not cover topamax anymore. pt states that something else needs to be given in it place.  pt states that they would not tell her what could be taken to replace it. states that she needed to contact her doctors' office.

## 2016-02-15 NOTE — Telephone Encounter (Signed)
Ask her to contact insurance and get list of medications in place of topamax/formulary.

## 2016-02-16 DIAGNOSIS — I2510 Atherosclerotic heart disease of native coronary artery without angina pectoris: Secondary | ICD-10-CM | POA: Diagnosis not present

## 2016-02-17 DIAGNOSIS — M21372 Foot drop, left foot: Secondary | ICD-10-CM | POA: Diagnosis not present

## 2016-02-17 DIAGNOSIS — I2510 Atherosclerotic heart disease of native coronary artery without angina pectoris: Secondary | ICD-10-CM | POA: Diagnosis not present

## 2016-02-18 DIAGNOSIS — I2510 Atherosclerotic heart disease of native coronary artery without angina pectoris: Secondary | ICD-10-CM | POA: Diagnosis not present

## 2016-02-19 DIAGNOSIS — I2510 Atherosclerotic heart disease of native coronary artery without angina pectoris: Secondary | ICD-10-CM | POA: Diagnosis not present

## 2016-02-20 DIAGNOSIS — I2510 Atherosclerotic heart disease of native coronary artery without angina pectoris: Secondary | ICD-10-CM | POA: Diagnosis not present

## 2016-02-21 DIAGNOSIS — I208 Other forms of angina pectoris: Secondary | ICD-10-CM | POA: Diagnosis not present

## 2016-02-21 DIAGNOSIS — E119 Type 2 diabetes mellitus without complications: Secondary | ICD-10-CM | POA: Diagnosis not present

## 2016-02-21 DIAGNOSIS — F17200 Nicotine dependence, unspecified, uncomplicated: Secondary | ICD-10-CM | POA: Diagnosis not present

## 2016-02-21 DIAGNOSIS — R011 Cardiac murmur, unspecified: Secondary | ICD-10-CM | POA: Diagnosis not present

## 2016-02-21 DIAGNOSIS — E669 Obesity, unspecified: Secondary | ICD-10-CM | POA: Diagnosis not present

## 2016-02-21 DIAGNOSIS — I2510 Atherosclerotic heart disease of native coronary artery without angina pectoris: Secondary | ICD-10-CM | POA: Diagnosis not present

## 2016-02-21 DIAGNOSIS — R0602 Shortness of breath: Secondary | ICD-10-CM | POA: Diagnosis not present

## 2016-02-21 DIAGNOSIS — R030 Elevated blood-pressure reading, without diagnosis of hypertension: Secondary | ICD-10-CM | POA: Diagnosis not present

## 2016-02-21 DIAGNOSIS — N182 Chronic kidney disease, stage 2 (mild): Secondary | ICD-10-CM | POA: Diagnosis not present

## 2016-02-22 DIAGNOSIS — I2510 Atherosclerotic heart disease of native coronary artery without angina pectoris: Secondary | ICD-10-CM | POA: Diagnosis not present

## 2016-02-23 DIAGNOSIS — I2510 Atherosclerotic heart disease of native coronary artery without angina pectoris: Secondary | ICD-10-CM | POA: Diagnosis not present

## 2016-02-24 DIAGNOSIS — I2510 Atherosclerotic heart disease of native coronary artery without angina pectoris: Secondary | ICD-10-CM | POA: Diagnosis not present

## 2016-02-25 ENCOUNTER — Emergency Department
Admission: EM | Admit: 2016-02-25 | Discharge: 2016-02-25 | Disposition: A | Discharge status: Home / Self Care | Payer: Commercial Managed Care - HMO | Attending: Emergency Medicine | Admitting: Emergency Medicine

## 2016-02-25 ENCOUNTER — Encounter: Payer: Self-pay | Admitting: Urgent Care

## 2016-02-25 DIAGNOSIS — R509 Fever, unspecified: Secondary | ICD-10-CM | POA: Insufficient documentation

## 2016-02-25 DIAGNOSIS — I509 Heart failure, unspecified: Secondary | ICD-10-CM | POA: Insufficient documentation

## 2016-02-25 DIAGNOSIS — I2510 Atherosclerotic heart disease of native coronary artery without angina pectoris: Secondary | ICD-10-CM | POA: Diagnosis not present

## 2016-02-25 DIAGNOSIS — J111 Influenza due to unidentified influenza virus with other respiratory manifestations: Secondary | ICD-10-CM

## 2016-02-25 DIAGNOSIS — E119 Type 2 diabetes mellitus without complications: Secondary | ICD-10-CM | POA: Diagnosis not present

## 2016-02-25 DIAGNOSIS — J45909 Unspecified asthma, uncomplicated: Secondary | ICD-10-CM | POA: Diagnosis not present

## 2016-02-25 DIAGNOSIS — Z7984 Long term (current) use of oral hypoglycemic drugs: Secondary | ICD-10-CM | POA: Insufficient documentation

## 2016-02-25 DIAGNOSIS — Z79899 Other long term (current) drug therapy: Secondary | ICD-10-CM | POA: Insufficient documentation

## 2016-02-25 DIAGNOSIS — Z7982 Long term (current) use of aspirin: Secondary | ICD-10-CM | POA: Insufficient documentation

## 2016-02-25 DIAGNOSIS — F17210 Nicotine dependence, cigarettes, uncomplicated: Secondary | ICD-10-CM | POA: Insufficient documentation

## 2016-02-25 DIAGNOSIS — J449 Chronic obstructive pulmonary disease, unspecified: Secondary | ICD-10-CM | POA: Insufficient documentation

## 2016-02-25 DIAGNOSIS — I110 Hypertensive heart disease with heart failure: Secondary | ICD-10-CM | POA: Diagnosis not present

## 2016-02-25 LAB — INFLUENZA PANEL BY PCR (TYPE A & B)
INFLAPCR: NEGATIVE
INFLBPCR: NEGATIVE

## 2016-02-25 MED ORDER — ONDANSETRON 4 MG PO TBDP
4.0000 mg | ORAL_TABLET | Freq: Once | ORAL | Status: AC
  Administered 2016-02-25: 4 mg via ORAL

## 2016-02-25 MED ORDER — ONDANSETRON 4 MG PO TBDP
4.0000 mg | ORAL_TABLET | Freq: Once | ORAL | Status: AC
  Administered 2016-02-25: 4 mg via ORAL
  Filled 2016-02-25: qty 1

## 2016-02-25 MED ORDER — HYDROCOD POLST-CPM POLST ER 10-8 MG/5ML PO SUER
5.0000 mL | Freq: Once | ORAL | Status: AC
  Administered 2016-02-25: 5 mL via ORAL
  Filled 2016-02-25: qty 5

## 2016-02-25 MED ORDER — ONDANSETRON 4 MG PO TBDP
ORAL_TABLET | ORAL | Status: AC
  Administered 2016-02-25: 4 mg via ORAL
  Filled 2016-02-25: qty 1

## 2016-02-25 MED ORDER — HYDROCOD POLST-CPM POLST ER 10-8 MG/5ML PO SUER: 5.0000 mL | Freq: Two times a day (BID) | ORAL | 0 refills | Status: DC

## 2016-02-25 MED ORDER — IBUPROFEN 800 MG PO TABS
800.0000 mg | ORAL_TABLET | Freq: Once | ORAL | Status: AC
  Administered 2016-02-25: 800 mg via ORAL
  Filled 2016-02-25: qty 1

## 2016-02-25 MED ORDER — ONDANSETRON 4 MG PO TBDP: 4.0000 mg | ORAL_TABLET | Freq: Three times a day (TID) | ORAL | 0 refills | Status: DC | PRN

## 2016-02-25 NOTE — ED Notes (Signed)
Pt has been experiencing body chills, body aches, ear ache, n/v/d, sore throat and coughing. Pt denying coughing up anything. Pt stating her last emesis "was a little bit ago." Pt stating that she vomited twice today. Pt is unsure of how high her fever was because she has not checked it.

## 2016-02-25 NOTE — ED Triage Notes (Addendum)
Patient presents with c/o N/V/D, chills, NP cough, and generalized myalgias that began yesterday. Patient reports that she has been unable to keep anything down except some fluids. Reported emesis episodes x 5; diarrhea episodes x 7 since yesterday.

## 2016-02-25 NOTE — ED Provider Notes (Signed)
[facility] Emergency Department Provider Note        Time seen: ----------------------------------------- 9:34 PM on 02/25/2016 -----------------------------------------    I have reviewed the triage vital signs and the nursing notes.   HISTORY  Chief Complaint Influenza; Nausea; Emesis; and Diarrhea    HPI Marie Clarke is a 46 y.o. female who presents to the ER with fevers, chills, aches, cough, nausea, vomiting and diarrhea. Symptom onset was yesterday. Patient reports she is unable to keep anything down other than some fluids. She reports vomiting 5 times having 7 diarrheal episodes. She denies any history of these complaints.   Past Medical History:  Diagnosis Date  . Anemia   . Anxiety   . Asthma   . Bipolar disorder ([place])   . CAD (coronary artery disease) unk  . CHF (congestive heart failure) ([place])   . COPD (chronic obstructive pulmonary disease) ([place])   . Depression unk  . Diabetes mellitus without complication ([place])   . Diabetes mellitus, type II ([place])   . Drug overdose   . GERD (gastroesophageal reflux disease)   . Headache   . Hyperlipidemia   . Hypertension   . Left leg pain 04/29/2014  . MI (myocardial infarction)   . Muscle ache 09/16/2014  . Osteoporosis   . Overactive bladder   . Pancreatitis unk  . Reflex sympathetic dystrophy   . Renal insufficiency   . Sleep apnea    pt reported on 2/6/7 she currently is not using CPAP  . Sleep apnea   . Stroke ([place])   . Thyroid disease   . TIA (transient ischemic attack) unk  . TIA (transient ischemic attack)     Patient Active Problem List   Diagnosis Date Noted  . Contusion of knee, left 10/12/2015  . Sprain of left ankle 10/12/2015  . Strain of left knee 10/12/2015  . Ankle instability 07/20/2015  . Incidental lung nodule 04/28/2015  . Chronic pain 03/28/2015  . Chronic low back pain (Location of Primary Source of Pain) (Bilateral) (L>R) 03/28/2015  . Chronic lower extremity  pain (Left) 03/28/2015  . Abdominal wound dehiscence 03/28/2015  . Encounter for pain management planning 03/28/2015  . Morbid obesity ([place]) 03/28/2015  . Abnormal CT scan, lumbar spine 03/28/2015  . Lumbar facet hypertrophy 03/28/2015  . Lumbar facet syndrome (Location of Primary Source of Pain) (Bilateral) (L>R) 03/28/2015  . Lumbar foraminal stenosis (Bilateral) (L5-S1) 03/28/2015  . Chronic ankle pain (Left) 03/28/2015  . CRPS (complex regional pain syndrome) type I of lower limb (left ankle) 03/28/2015  . Neurogenic pain 03/28/2015  . Neuropathic pain 03/28/2015  . Myofascial pain 03/28/2015  . History of suicide attempt 03/28/2015  . 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 Abnormal gait 12/15/2014  . Congestive heart failure ([place]) 11/15/2014  . Abdominal wall abscess 09/20/2014  . 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 Diabetes mellitus, type 2 ([place]) 08/13/2014  . Bipolar affective disorder ([place]) 08/13/2014  . Type 2 diabetes mellitus ([place]) 08/13/2014  . Rectal prolapse 08/09/2014  . Rectal bleeding 08/09/2014  . Rectal bleed 08/09/2014  . Affective bipolar disorder ([place]) 08/05/2014  . Arteriosclerosis of coronary artery 08/05/2014  . CCF (congestive cardiac failure) ([place]) 08/05/2014  . Chronic kidney disease 08/05/2014  . 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 Apnea, sleep 08/05/2014  . Temporary cerebral vascular dysfunction 08/05/2014  . Polypharmacy 04/29/2014  . Other long term (current) drug therapy 04/29/2014  . 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 Chronic kidney disease, stage III (moderate) 12/14/2013  .  Controlled diabetes mellitus type II without complication ([place]) 99991111  . Essential (primary) hypertension 12/03/2013  . Adult hypothyroidism 12/03/2013  . Controlled type 2 diabetes mellitus without complication ([place]) 99991111    Past Surgical History:  Procedure Laterality Date  . ABDOMINAL HYSTERECTOMY    . prolapse rectum surgery N/A July 2016  .  TONSILLECTOMY      Allergies Diazepam; Sulfa antibiotics; Cephalexin; Ciprofloxacin; Flagyl [metronidazole]; Levofloxacin; Vancomycin; Penicillins; and Pregabalin  Social History Social History  Substance Use Topics  . Smoking status: Current Every Day Smoker    Packs/day: 1.00    Types: Cigarettes    Last attempt to quit: 09/09/2005  . Smokeless tobacco: Never Used  . Alcohol use No    Review of Systems Constitutional: Positive fevers, chills, aches Cardiovascular: Negative for chest pain. Respiratory: Negative for shortness of breath. Positive for cough Gastrointestinal: Negative for abdominal pain, positive for vomiting and diarrhea Genitourinary: Negative for dysuria. Musculoskeletal: Negative for back pain. Skin: Negative for rash. Neurological: Negative for headaches, focal weakness or numbness.  10-point ROS otherwise negative.  ____________________________________________   PHYSICAL EXAM:  VITAL SIGNS: ED Triage Vitals  Enc Vitals Group     BP 02/25/16 2031 114/79     Pulse Rate 02/25/16 2031 98     Resp 02/25/16 2031 18     Temp 02/25/16 2031 97.7 F (36.5 C)     Temp Source 02/25/16 2031 Oral     SpO2 02/25/16 2031 98 %     Weight 02/25/16 2032 225 lb (102.1 kg)     Height 02/25/16 2032 5\' 4"  (1.626 m)     Head Circumference --      Peak Flow --      Pain Score 02/25/16 2032 0     Pain Loc --      Pain Edu? --      Excl. in [place]? --     Constitutional: Alert and oriented. Well appearing and in no distress. Eyes: Conjunctivae are normal. PERRL. Normal extraocular movements. ENT   Head: Normocephalic and atraumatic.   Nose: No congestion/rhinnorhea.   Mouth/Throat: Mucous membranes are moist.   Neck: No stridor. Cardiovascular: Normal rate, regular rhythm. No murmurs, rubs, or gallops. Respiratory: Normal respiratory effort without tachypnea nor retractions. Breath sounds are clear and equal bilaterally. No  wheezes/rales/rhonchi. Gastrointestinal: Soft and nontender. Normal bowel sounds Musculoskeletal: Nontender with normal range of motion in all extremities. No lower extremity tenderness nor edema. Neurologic:  Normal speech and language. No gross focal neurologic deficits are appreciated.  Skin:  Skin is warm, dry and intact. No rash noted. Psychiatric: Mood and affect are normal. Speech and behavior are normal.  ____________________________________________  ED COURSE:  Pertinent labs & imaging results that were available during my care of the patient were reviewed by me and considered in my medical decision making (see chart for details). Clinical Course   Patient is in no distress, likely with influenza, we will treat symptomatically for same.  Procedures ____________________________________________   LABS (pertinent positives/negatives)  Labs Reviewed  INFLUENZA PANEL BY PCR (TYPE A & B, H1N1)   ____________________________________________  FINAL ASSESSMENT AND PLAN  Influenza  Plan: Patient with influenza clinically, we will discharge with symptomatic treatment. She is stable for outpatient follow-up with her doctor.   Earleen Newport, MD   Note: This dictation was prepared with Dragon dictation. Any transcriptional errors that result from this process are unintentional    Earleen Newport, MD 02/25/16 2135

## 2016-02-26 DIAGNOSIS — I2510 Atherosclerotic heart disease of native coronary artery without angina pectoris: Secondary | ICD-10-CM | POA: Diagnosis not present

## 2016-02-27 DIAGNOSIS — I2510 Atherosclerotic heart disease of native coronary artery without angina pectoris: Secondary | ICD-10-CM | POA: Diagnosis not present

## 2016-02-28 DIAGNOSIS — I2510 Atherosclerotic heart disease of native coronary artery without angina pectoris: Secondary | ICD-10-CM | POA: Diagnosis not present

## 2016-02-29 ENCOUNTER — Ambulatory Visit: Payer: Commercial Managed Care - HMO | Admitting: Psychiatry

## 2016-03-03 DIAGNOSIS — I2510 Atherosclerotic heart disease of native coronary artery without angina pectoris: Secondary | ICD-10-CM | POA: Diagnosis not present

## 2016-03-04 DIAGNOSIS — I2510 Atherosclerotic heart disease of native coronary artery without angina pectoris: Secondary | ICD-10-CM | POA: Diagnosis not present

## 2016-03-05 DIAGNOSIS — I2510 Atherosclerotic heart disease of native coronary artery without angina pectoris: Secondary | ICD-10-CM | POA: Diagnosis not present

## 2016-03-06 DIAGNOSIS — I2510 Atherosclerotic heart disease of native coronary artery without angina pectoris: Secondary | ICD-10-CM | POA: Diagnosis not present

## 2016-03-07 DIAGNOSIS — I2510 Atherosclerotic heart disease of native coronary artery without angina pectoris: Secondary | ICD-10-CM | POA: Diagnosis not present

## 2016-03-08 DIAGNOSIS — I2510 Atherosclerotic heart disease of native coronary artery without angina pectoris: Secondary | ICD-10-CM | POA: Diagnosis not present

## 2016-03-09 DIAGNOSIS — I2510 Atherosclerotic heart disease of native coronary artery without angina pectoris: Secondary | ICD-10-CM | POA: Diagnosis not present

## 2016-03-10 DIAGNOSIS — I2510 Atherosclerotic heart disease of native coronary artery without angina pectoris: Secondary | ICD-10-CM | POA: Diagnosis not present

## 2016-03-11 DIAGNOSIS — I2510 Atherosclerotic heart disease of native coronary artery without angina pectoris: Secondary | ICD-10-CM | POA: Diagnosis not present

## 2016-03-12 DIAGNOSIS — I2510 Atherosclerotic heart disease of native coronary artery without angina pectoris: Secondary | ICD-10-CM | POA: Diagnosis not present

## 2016-03-13 DIAGNOSIS — I2510 Atherosclerotic heart disease of native coronary artery without angina pectoris: Secondary | ICD-10-CM | POA: Diagnosis not present

## 2016-03-14 DIAGNOSIS — I2510 Atherosclerotic heart disease of native coronary artery without angina pectoris: Secondary | ICD-10-CM | POA: Diagnosis not present

## 2016-03-20 ENCOUNTER — Other Ambulatory Visit: Payer: Self-pay | Admitting: Psychiatry

## 2016-03-22 ENCOUNTER — Telehealth: Payer: Self-pay

## 2016-03-22 NOTE — Telephone Encounter (Signed)
pt called needs refill on trazodone. [pt has appt on  05-09-16

## 2016-03-22 NOTE — Telephone Encounter (Signed)
called icalled in ok for refill for trazodone with no reftills. left message on doctor's linen ok for refill for trazodone with no reftills. left message on doctor's line

## 2016-03-22 NOTE — Telephone Encounter (Signed)
pt called about getting refill on trazodone

## 2016-03-26 NOTE — Telephone Encounter (Signed)
noted 

## 2016-03-29 ENCOUNTER — Other Ambulatory Visit: Payer: Self-pay | Admitting: Family Medicine

## 2016-03-29 DIAGNOSIS — Z1239 Encounter for other screening for malignant neoplasm of breast: Secondary | ICD-10-CM

## 2016-04-11 ENCOUNTER — Ambulatory Visit: Payer: Self-pay | Admitting: Psychiatry

## 2016-04-16 ENCOUNTER — Telehealth: Payer: Self-pay

## 2016-04-16 NOTE — Telephone Encounter (Signed)
pt wants to know if you can increase her lamictal  she has a part time job and she getting a little stressful  and she was just wanting to see if by increase if that would help.    Pt wanted to know by 12:00 today.

## 2016-04-26 ENCOUNTER — Encounter: Payer: Self-pay | Admitting: *Deleted

## 2016-04-26 ENCOUNTER — Emergency Department: Payer: Medicare HMO

## 2016-04-26 ENCOUNTER — Emergency Department
Admission: EM | Admit: 2016-04-26 | Discharge: 2016-04-26 | Disposition: A | Discharge status: Home / Self Care | Payer: Medicare HMO | Attending: Emergency Medicine | Admitting: Emergency Medicine

## 2016-04-26 DIAGNOSIS — J45909 Unspecified asthma, uncomplicated: Secondary | ICD-10-CM | POA: Insufficient documentation

## 2016-04-26 DIAGNOSIS — E119 Type 2 diabetes mellitus without complications: Secondary | ICD-10-CM | POA: Diagnosis not present

## 2016-04-26 DIAGNOSIS — I110 Hypertensive heart disease with heart failure: Secondary | ICD-10-CM | POA: Insufficient documentation

## 2016-04-26 DIAGNOSIS — Z7982 Long term (current) use of aspirin: Secondary | ICD-10-CM | POA: Insufficient documentation

## 2016-04-26 DIAGNOSIS — F17210 Nicotine dependence, cigarettes, uncomplicated: Secondary | ICD-10-CM | POA: Insufficient documentation

## 2016-04-26 DIAGNOSIS — I509 Heart failure, unspecified: Secondary | ICD-10-CM | POA: Diagnosis not present

## 2016-04-26 DIAGNOSIS — Z79899 Other long term (current) drug therapy: Secondary | ICD-10-CM | POA: Diagnosis not present

## 2016-04-26 DIAGNOSIS — R1031 Right lower quadrant pain: Secondary | ICD-10-CM | POA: Insufficient documentation

## 2016-04-26 DIAGNOSIS — J449 Chronic obstructive pulmonary disease, unspecified: Secondary | ICD-10-CM | POA: Diagnosis not present

## 2016-04-26 DIAGNOSIS — R1030 Lower abdominal pain, unspecified: Secondary | ICD-10-CM

## 2016-04-26 DIAGNOSIS — I2510 Atherosclerotic heart disease of native coronary artery without angina pectoris: Secondary | ICD-10-CM | POA: Diagnosis not present

## 2016-04-26 LAB — COMPREHENSIVE METABOLIC PANEL
ALBUMIN: 4.1 g/dL (ref 3.5–5.0)
ALT: 13 U/L — ABNORMAL LOW (ref 14–54)
ANION GAP: 8 (ref 5–15)
AST: 15 U/L (ref 15–41)
Alkaline Phosphatase: 91 U/L (ref 38–126)
BUN: 17 mg/dL (ref 6–20)
CHLORIDE: 105 mmol/L (ref 101–111)
CO2: 24 mmol/L (ref 22–32)
Calcium: 9.1 mg/dL (ref 8.9–10.3)
Creatinine, Ser: 0.69 mg/dL (ref 0.44–1.00)
GFR calc Af Amer: >60 mL/min (ref >60)
GFR calc non Af Amer: >60 mL/min (ref >60)
GLUCOSE: 100 mg/dL — AB (ref 65–99)
POTASSIUM: 3.5 mmol/L (ref 3.5–5.1)
SODIUM: 137 mmol/L (ref 135–145)
Total Bilirubin: 0.4 mg/dL (ref 0.3–1.2)
Total Protein: 7.8 g/dL (ref 6.5–8.1)

## 2016-04-26 LAB — LIPASE, BLOOD: LIPASE: 23 U/L (ref 11–51)

## 2016-04-26 LAB — URINALYSIS, COMPLETE (UACMP) WITH MICROSCOPIC
Bacteria, UA: NONE SEEN
Bilirubin Urine: NEGATIVE
GLUCOSE, UA: NEGATIVE mg/dL
Hgb urine dipstick: NEGATIVE
Ketones, ur: NEGATIVE mg/dL
Leukocytes, UA: NEGATIVE
Nitrite: NEGATIVE
PH: 5 (ref 5.0–8.0)
Protein, ur: NEGATIVE mg/dL
RBC / HPF: NONE SEEN RBC/hpf (ref 0–5)
SPECIFIC GRAVITY, URINE: 1.011 (ref 1.005–1.030)
WBC, UA: NONE SEEN WBC/hpf (ref 0–5)

## 2016-04-26 LAB — CBC
HEMATOCRIT: 37.6 % (ref 35.0–47.0)
HEMOGLOBIN: 13 g/dL (ref 12.0–16.0)
MCH: 33.3 pg (ref 26.0–34.0)
MCHC: 34.7 g/dL (ref 32.0–36.0)
MCV: 96.1 fL (ref 80.0–100.0)
PLATELETS: 235 10*3/uL (ref 150–440)
RBC: 3.91 MIL/uL (ref 3.80–5.20)
RDW: 13.7 % (ref 11.5–14.5)
WBC: 10.7 10*3/uL (ref 3.6–11.0)

## 2016-04-26 MED ORDER — ONDANSETRON HCL 4 MG/2ML IJ SOLN
4.0000 mg | Freq: Once | INTRAMUSCULAR | Status: AC
  Administered 2016-04-26: 4 mg via INTRAVENOUS
  Filled 2016-04-26: qty 2

## 2016-04-26 MED ORDER — MORPHINE SULFATE (PF) 4 MG/ML IV SOLN
4.0000 mg | Freq: Once | INTRAVENOUS | Status: AC
  Administered 2016-04-26: 4 mg via INTRAVENOUS
  Filled 2016-04-26: qty 1

## 2016-04-26 MED ORDER — TRAMADOL HCL 50 MG PO TABS: 50.0000 mg | ORAL_TABLET | Freq: Four times a day (QID) | ORAL | 0 refills | Status: DC | PRN

## 2016-04-26 MED ORDER — IOPAMIDOL (ISOVUE-300) INJECTION 61%
100.0000 mL | Freq: Once | INTRAVENOUS | Status: AC | PRN
  Administered 2016-04-26: 100 mL via INTRAVENOUS

## 2016-04-26 MED ORDER — IOPAMIDOL (ISOVUE-300) INJECTION 61%
30.0000 mL | Freq: Once | INTRAVENOUS | Status: AC | PRN
  Administered 2016-04-26: 30 mL via ORAL

## 2016-04-26 NOTE — ED Provider Notes (Signed)
[facility] Emergency Department Provider Note   ____________________________________________    I have reviewed the triage vital signs and the nursing notes.   HISTORY  Chief Complaint Abdominal Pain     HPI Marie Clarke is a 46 y.o. female who presents with complaints of abdominal pain. Patient reports one day of right lower quadrant discomfort and feels like "there is something in there". She denies nausea or vomiting. She does report a history of abdominal surgery in the past. No fevers or chills. She has never had this before. Pain is crampy in nature and mild to moderate. normal bowel movements   Past Medical History:  Diagnosis Date  . Anemia   . Anxiety   . Asthma   . Bipolar disorder ([place])   . CAD (coronary artery disease) unk  . CHF (congestive heart failure) ([place])   . COPD (chronic obstructive pulmonary disease) ([place])   . Depression unk  . Diabetes mellitus without complication ([place])   . Diabetes mellitus, type II ([place])   . Drug overdose   . GERD (gastroesophageal reflux disease)   . Headache   . Hyperlipidemia   . Hypertension   . Left leg pain 04/29/2014  . MI (myocardial infarction)   . Muscle ache 09/16/2014  . Osteoporosis   . Overactive bladder   . Pancreatitis unk  . Reflex sympathetic dystrophy   . Renal insufficiency   . Sleep apnea    pt reported on 2/6/7 she currently is not using CPAP  . Sleep apnea   . Stroke ([place])   . Thyroid disease   . TIA (transient ischemic attack) unk  . TIA (transient ischemic attack)     Patient Active Problem List   Diagnosis Date Noted  . Contusion of knee, left 10/12/2015  . Sprain of left ankle 10/12/2015  . Strain of left knee 10/12/2015  . Ankle instability 07/20/2015  . Incidental lung nodule 04/28/2015  . Chronic pain 03/28/2015  . Chronic low back pain (Location of Primary Source of Pain) (Bilateral) (L>R) 03/28/2015  . Chronic lower extremity pain (Left) 03/28/2015  .  Abdominal wound dehiscence 03/28/2015  . Encounter for pain management planning 03/28/2015  . Morbid obesity ([place]) 03/28/2015  . Abnormal CT scan, lumbar spine 03/28/2015  . Lumbar facet hypertrophy 03/28/2015  . Lumbar facet syndrome (Location of Primary Source of Pain) (Bilateral) (L>R) 03/28/2015  . Lumbar foraminal stenosis (Bilateral) (L5-S1) 03/28/2015  . Chronic ankle pain (Left) 03/28/2015  . CRPS (complex regional pain syndrome) type I of lower limb (left ankle) 03/28/2015  . Neurogenic pain 03/28/2015  . Neuropathic pain 03/28/2015  . Myofascial pain 03/28/2015  . History of suicide attempt 03/28/2015  . 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 Abnormal gait 12/15/2014  . Congestive heart failure ([place]) 11/15/2014  . Abdominal wall abscess 09/20/2014  . 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 Diabetes mellitus, type 2 ([place]) 08/13/2014  . Bipolar affective disorder ([place]) 08/13/2014  . Type 2 diabetes mellitus ([place]) 08/13/2014  . Rectal prolapse 08/09/2014  . Rectal bleeding 08/09/2014  . Rectal bleed 08/09/2014  . Affective bipolar disorder ([place]) 08/05/2014  . Arteriosclerosis of coronary artery 08/05/2014  . CCF (congestive cardiac failure) ([place]) 08/05/2014  . Chronic kidney disease 08/05/2014  . 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 Apnea, sleep 08/05/2014  . Temporary cerebral vascular dysfunction 08/05/2014  . Polypharmacy 04/29/2014  . Other long term (current) drug therapy 04/29/2014  . 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 Chronic kidney disease, stage III (  moderate) 12/14/2013  . Controlled diabetes mellitus type II without complication ([place]) 41/93/7902  . Essential (primary) hypertension 12/03/2013  . Adult hypothyroidism 12/03/2013  . Controlled type 2 diabetes mellitus without complication ([place]) 40/97/3532    Past Surgical History:  Procedure Laterality Date  . ABDOMINAL HYSTERECTOMY    . prolapse rectum surgery N/A July 2016  . TONSILLECTOMY      Prior  to Admission medications   Medication Sig Start Date End Date Taking? Authorizing Provider  albuterol (PROVENTIL HFA;VENTOLIN HFA) 108 (90 BASE) MCG/ACT inhaler Inhale 2 puffs into the lungs every 4 (four) hours as needed for wheezing or shortness of breath.    Historical Provider, MD  aspirin EC 81 MG tablet Take 81 mg by mouth daily at 12 noon.    Historical Provider, MD  atorvastatin (LIPITOR) 40 MG tablet Take 40 mg by mouth at bedtime.     Historical Provider, MD  atorvastatin (LIPITOR) 40 MG tablet Take by mouth. 09/27/15 08/04/16  Historical Provider, MD  azelastine (ASTELIN) 0.1 % nasal spray Place 1 spray into both nostrils daily.    Historical Provider, MD  busPIRone (BUSPAR) 15 MG tablet TAKE 1 TABLET THREE TIMES DAILY 01/19/16   Rainey Pines, MD  chlorpheniramine-HYDROcodone (TUSSIONEX PENNKINETIC ER) 10-8 MG/5ML SUER Take 5 mLs by mouth 2 (two) times daily. 02/25/16   Earleen Newport, MD  cyclobenzaprine (FLEXERIL) 5 MG tablet Take 1 tablet (5 mg total) by mouth every 8 (eight) hours as needed for muscle spasms. 08/02/15   Jami L Hagler, PA-C  cyclobenzaprine (FLEXERIL) 5 MG tablet Take by mouth. 09/27/15 08/04/16  Historical Provider, MD  fluticasone-salmeterol (ADVAIR HFA) 230-21 MCG/ACT inhaler Inhale 2 puffs into the lungs 2 (two) times daily.    Historical Provider, MD  fluticasone-salmeterol (ADVAIR HFA) 230-21 MCG/ACT inhaler Inhale into the lungs. 04/28/15 04/27/16  Historical Provider, MD  furosemide (LASIX) 40 MG tablet Take by mouth. 09/27/15 08/04/16  Historical Provider, MD  gabapentin (NEURONTIN) 300 MG capsule TAKE 1 CAPSULE FOUR TIMES DAILY 01/19/16   Rainey Pines, MD  glipiZIDE (GLUCOTROL XL) 5 MG 24 hr tablet Take by mouth. 09/27/15 08/04/16  Historical Provider, MD  KLOR-CON M20 20 MEQ tablet  10/13/15   Historical Provider, MD  lamoTRIgine (LAMICTAL) 200 MG tablet TAKE 1 TABLET EVERY DAY 01/19/16   Rainey Pines, MD  levocetirizine (XYZAL) 5 MG tablet Take 5 mg by mouth at bedtime.      Historical Provider, MD  levocetirizine Harlow Ohms) 5 MG tablet Take by mouth. 10/04/15 10/03/16  Historical Provider, MD  levothyroxine (SYNTHROID, LEVOTHROID) 200 MCG tablet Take 200 mcg by mouth daily.     Historical Provider, MD  meloxicam (MOBIC) 15 MG tablet  09/28/15   Historical Provider, MD  metoprolol succinate (TOPROL-XL) 25 MG 24 hr tablet Take by mouth. 09/27/15 08/04/16  Historical Provider, MD  montelukast (SINGULAIR) 10 MG tablet Take 10 mg by mouth daily.     Historical Provider, MD  Multiple Vitamin (MULTIVITAMIN WITH MINERALS) TABS tablet Take 1 tablet by mouth daily.    Historical Provider, MD  naproxen (NAPROSYN) 500 MG tablet Take 1 tablet (500 mg total) by mouth 2 (two) times daily with a meal. 08/02/15   Jami L Hagler, PA-C  Olopatadine HCl (PATADAY) 0.2 % SOLN Apply 1 drop to eye as needed (for dry eyes).    Historical Provider, MD  ondansetron (ZOFRAN ODT) 4 MG disintegrating tablet Take 1 tablet (4 mg total) by mouth every 8 (eight) hours as needed  for nausea or vomiting. 02/25/16   Earleen Newport, MD  oxyCODONE-acetaminophen (PERCOCET) 5-325 MG tablet Take 1 tablet by mouth every 4 (four) hours as needed for severe pain. 07/19/15   [name], PA-C  promethazine (PHENERGAN) 25 MG tablet Take 25 mg by mouth. 03/16/15   Historical Provider, MD  promethazine (PHENERGAN) 25 MG tablet Take by mouth. 09/27/15 08/04/16  Historical Provider, MD  ranitidine (ZANTAC) 300 MG tablet Take 1 tablet (300 mg total) by mouth 2 (two) times daily. Patient taking differently: Take 300 mg by mouth daily.  11/30/14   Carrie Mew, MD  ranolazine (RANEXA) 500 MG 12 hr tablet Take 500 mg by mouth 2 (two) times daily.    Historical Provider, MD  solifenacin (VESICARE) 10 MG tablet Take 10 mg by mouth at bedtime.    Historical Provider, MD  tiotropium (SPIRIVA) 18 MCG inhalation capsule Place 18 mcg into inhaler and inhale at bedtime.    Historical Provider, MD  topiramate (TOPAMAX) 100 MG  tablet TAKE 1 TABLET TWICE DAILY 01/19/16   Rainey Pines, MD  torsemide (DEMADEX) 20 MG tablet Take 20 mg by mouth daily.    Historical Provider, MD  traMADol (ULTRAM) 50 MG tablet Take 1 tablet (50 mg total) by mouth every 6 (six) hours as needed. 04/26/16 04/26/17  Lavonia Drafts, MD  traZODone (DESYREL) 50 MG tablet Take 1 tablet (50 mg total) by mouth at bedtime. 01/23/16   Rainey Pines, MD  traZODone (DESYREL) 50 MG tablet TAKE 1 TABLET BY MOUTH DAILY AT BEDTIME 03/22/16   Rainey Pines, MD     Allergies Diazepam; Sulfa antibiotics; Cephalexin; Ciprofloxacin; Flagyl [metronidazole]; Levofloxacin; Vancomycin; Penicillins; and Pregabalin  Family History  Problem Relation Age of Onset  . Diabetes Mellitus II Mother   . CAD Mother   . Sleep apnea Mother   . Osteoarthritis Mother   . Osteoporosis Mother   . Anxiety disorder Mother   . Depression Mother   . Bipolar disorder Mother   . Bipolar disorder Father   . Hypertension Father   . Depression Father   . Anxiety disorder Father   . Post-traumatic stress disorder Sister     Social History Social History  Substance Use Topics  . Smoking status: Current Every Day Smoker    Packs/day: 1.00    Types: Cigarettes    Last attempt to quit: 09/09/2005  . Smokeless tobacco: Never Used  . Alcohol use No    Review of Systems  Constitutional: No fever/chills Eyes: No visual changes.   Cardiovascular: Denies chest pain. Respiratory: Denies shortness of breath. Gastrointestinal: As above   Genitourinary: Negative for dysuria. Musculoskeletal: Negative for back pain. Skin: Negative for rash. Neurological: Negative for headaches  10-point ROS otherwise negative.  ____________________________________________   PHYSICAL EXAM:  VITAL SIGNS: ED Triage Vitals  Enc Vitals Group     BP 04/26/16 1749 114/70     Pulse Rate 04/26/16 1749 99     Resp 04/26/16 1749 20     Temp 04/26/16 1749 98.3 F (36.8 C)     Temp Source 04/26/16 1749  Oral     SpO2 04/26/16 1749 97 %     Weight 04/26/16 1751 223 lb (101.2 kg)     Height 04/26/16 1751 5\' 4"  (1.626 m)     Head Circumference --      Peak Flow --      Pain Score 04/26/16 1751 10     Pain Loc --  Pain Edu? --      Excl. in [place]? --     Constitutional: Alert and oriented. No acute distress. Pleasant and interactive Eyes: Conjunctivae are normal.  Head: Atraumatic. Nose: No congestion/rhinnorhea. Mouth/Throat: Mucous membranes are moist.    Cardiovascular: Normal rate, regular rhythm. Grossly normal heart sounds.  Good peripheral circulation. Respiratory: Normal respiratory effort.  No retractions. Lungs CTAB. Gastrointestinal:Mild to palpation right lower quadrant. No distention.  No CVA tenderness. Genitourinary: deferred Musculoskeletal: No lower extremity tenderness nor edema.  Warm and well perfused Neurologic:  Normal speech and language. No gross focal neurologic deficits are appreciated.  Skin:  Skin is warm, dry and intact. No rash noted. Psychiatric: Mood and affect are normal. Speech and behavior are normal.  ____________________________________________   LABS (all labs ordered are listed, but only abnormal results are displayed)  Labs Reviewed  COMPREHENSIVE METABOLIC PANEL - Abnormal; Notable for the following:       Result Value   Glucose, Bld 100 (*)    ALT 13 (*)    All other components within normal limits  URINALYSIS, COMPLETE (UACMP) WITH MICROSCOPIC - Abnormal; Notable for the following:    Color, Urine YELLOW (*)    APPearance CLEAR (*)    Squamous Epithelial / LPF 0-5 (*)    All other components within normal limits  LIPASE, BLOOD  CBC   ____________________________________________  EKG  None ____________________________________________  RADIOLOGY  CT abdomen and pelvis negative ____________________________________________   PROCEDURES  Procedure(s) performed: No    Critical Care performed:  No ____________________________________________   INITIAL IMPRESSION / ASSESSMENT AND PLAN / ED COURSE  Pertinent labs & imaging results that were available during my care of the patient were reviewed by me and considered in my medical decision making (see chart for details).  Patient well-appearing and in no acute distress. She does have mild tenderness in the right lower quadrant, CT scan unremarkable. Labs benign. Patient treated with IV analgesics and Zofran and reports feeling better. Recommend follow-up with her PCP. Return precautions discussed    ____________________________________________   FINAL CLINICAL IMPRESSION(S) / ED DIAGNOSES  Final diagnoses:  Lower abdominal pain      NEW MEDICATIONS STARTED DURING THIS VISIT:  New Prescriptions   TRAMADOL (ULTRAM) 50 MG TABLET    Take 1 tablet (50 mg total) by mouth every 6 (six) hours as needed.     Note:  This document was prepared using Dragon voice recognition software and may include unintentional dictation errors.    Lavonia Drafts, MD 04/26/16 2023

## 2016-04-26 NOTE — ED Triage Notes (Signed)
Pt has right lower abd pain.  Pt has nausea.  Diarrhea x 1.  No back pain.  Pt alert.

## 2016-04-27 ENCOUNTER — Ambulatory Visit: Payer: Commercial Managed Care - HMO

## 2016-05-09 ENCOUNTER — Ambulatory Visit (INDEPENDENT_AMBULATORY_CARE_PROVIDER_SITE_OTHER): Payer: Medicare HMO | Admitting: Psychiatry

## 2016-05-09 ENCOUNTER — Encounter: Payer: Self-pay | Admitting: Psychiatry

## 2016-05-09 VITALS — BP 135/88 | HR 130 | Temp 98.6°F | Wt 228.8 lb

## 2016-05-09 DIAGNOSIS — F3160 Bipolar disorder, current episode mixed, unspecified: Secondary | ICD-10-CM | POA: Diagnosis not present

## 2016-05-09 DIAGNOSIS — F4310 Post-traumatic stress disorder, unspecified: Secondary | ICD-10-CM

## 2016-05-09 MED ORDER — BUSPIRONE HCL 15 MG PO TABS: 15.0000 mg | ORAL_TABLET | Freq: Three times a day (TID) | ORAL | 0 refills | Status: DC

## 2016-05-09 MED ORDER — ESCITALOPRAM OXALATE 10 MG PO TABS: ORAL_TABLET | ORAL | 1 refills | Status: DC

## 2016-05-09 MED ORDER — LAMOTRIGINE 200 MG PO TABS: 200.0000 mg | ORAL_TABLET | Freq: Every day | ORAL | 0 refills | Status: DC

## 2016-05-09 MED ORDER — TOPIRAMATE 100 MG PO TABS
100.0000 mg | ORAL_TABLET | Freq: Two times a day (BID) | ORAL | 0 refills | Status: DC
Start: 2016-05-09 — End: 2016-07-04

## 2016-05-09 NOTE — Progress Notes (Signed)
[facility] MD/PA/NP OP Progress Note  05/09/2016 2:42 PM Marie Clarke  MRN:  [id number]  Subjective:  Patient is a 46 year old female with history of bipolar disorder who presented for follow-up appointment.  She reported that she has been becoming more agitated recently and wants her medications to be adjusted. We discussed about her medications. She reported that she does not want to take any mood stabilizer as she has taking them in the past and they were not helpful. She is currently on the lamotrigine 200 mg. She reported that she has been compliant with her medications. Patient reported that she feels angry quickly and has been feeling depressed. She is currently her own payee. She appeared calm and alert during the interview. She denied having any suicidal homicidal ideations or plans. Her pulse is high at this time. We will recheck her blood pressure before he leaves the clinic today.    Chief Complaint:  Chief Complaint    Follow-up; Medication Refill     Visit Diagnosis:     ICD-9-CM ICD-10-CM   1. Bipolar I disorder, most recent episode mixed ([place]) 296.60 F31.60   2. PTSD (post-traumatic stress disorder) 309.81 F43.10     Past Medical History:  Past Medical History:  Diagnosis Date  . Anemia   . Anxiety   . Asthma   . Bipolar disorder ([place])   . CAD (coronary artery disease) unk  . CHF (congestive heart failure) ([place])   . COPD (chronic obstructive pulmonary disease) ([place])   . Depression unk  . Diabetes mellitus without complication ([place])   . Diabetes mellitus, type II ([place])   . Drug overdose   . GERD (gastroesophageal reflux disease)   . Headache   . Hyperlipidemia   . Hypertension   . Left leg pain 04/29/2014  . MI (myocardial infarction)   . Muscle ache 09/16/2014  . Osteoporosis   . Overactive bladder   . Pancreatitis unk  . Reflex sympathetic dystrophy   . Renal insufficiency   . Sleep apnea    pt reported on 2/6/7 she currently is not using CPAP  . Sleep apnea   .  Stroke ([place])   . Thyroid disease   . TIA (transient ischemic attack) unk  . TIA (transient ischemic attack)     Past Surgical History:  Procedure Laterality Date  . ABDOMINAL HYSTERECTOMY    . prolapse rectum surgery N/A July 2016  . TONSILLECTOMY     Family History:  Family History  Problem Relation Age of Onset  . Diabetes Mellitus II Mother   . CAD Mother   . Sleep apnea Mother   . Osteoarthritis Mother   . Osteoporosis Mother   . Anxiety disorder Mother   . Depression Mother   . Bipolar disorder Mother   . Bipolar disorder Father   . Hypertension Father   . Depression Father   . Anxiety disorder Father   . Post-traumatic stress disorder Sister    Social History:  Social History   Social History  . Marital status: Single    Spouse name: N/A  . Number of children: N/A  . Years of education: N/A   Social History Main Topics  . Smoking status: Current Every Day Smoker    Packs/day: 1.00    Types: Cigarettes    Last attempt to quit: 09/09/2005  . Smokeless tobacco: Never Used  . Alcohol use No  . Drug use: No  . Sexual activity: Not Currently   Other Topics Concern  .  None   Social History Narrative  . None   Additional History:   Assessment:   Musculoskeletal: Strength & Muscle Tone: within normal limits Gait & Station: Slow and ambulates with a cane Patient leans: N/A  Psychiatric Specialty Exam: Medication Refill     Review of Systems  Musculoskeletal: Positive for back pain and joint pain.  Psychiatric/Behavioral: Negative for hallucinations, memory loss, substance abuse and suicidal ideas. Depression: she's reporting some day-to-day fluctuation in her mood from depressed to being upbeat. The patient has insomnia.   All other systems reviewed and are negative.   Blood pressure 138/81, pulse (!) 130, temperature 98.6 F (37 C), temperature source Oral, weight 228 lb 12.8 oz (103.8 kg).Body mass index is 39.27 kg/m.  General Appearance: Well  Groomed  Eye Contact:  Good  Speech:  Slow  Volume:  Normal  Mood:  Anxious and Depressed  Affect:  Congruent  Thought Process:  Goal Directed  Orientation:  Full (Time, Place, and Person)  Thought Content:  Negative  Suicidal Thoughts:  No  Homicidal Thoughts:  No  Memory:  Immediate;   Good Recent;   Good Remote;   Good  Judgement:  Good  Insight:  Fair  Psychomotor Activity:  Normal  Concentration:  Good  Recall:  Good  Fund of Knowledge: Good  Language: Good  Akathisia:  Negative  Handed:  Right unknown   AIMS (if indicated):  N/A  Assets:  Desire for Improvement Social Support  ADL's:  Intact  Cognition: WNL  Sleep:  good   Is the patient at risk to self?  No. Has the patient been a risk to self in the past 6 months?  No. Has the patient been a risk to self within the distant past?  Yes.   Is the patient a risk to others?  No. Has the patient been a risk to others in the past 6 months?  No. Has the patient been a risk to others within the distant past?  No.  Current Medications: Current Outpatient Prescriptions  Medication Sig Dispense Refill  . albuterol (PROVENTIL HFA;VENTOLIN HFA) 108 (90 BASE) MCG/ACT inhaler Inhale 2 puffs into the lungs every 4 (four) hours as needed for wheezing or shortness of breath.    Marland Kitchen aspirin EC 81 MG tablet Take 81 mg by mouth daily at 12 noon.    Marland Kitchen atorvastatin (LIPITOR) 40 MG tablet Take 40 mg by mouth at bedtime.     Marland Kitchen atorvastatin (LIPITOR) 40 MG tablet Take by mouth.    Marland Kitchen azelastine (ASTELIN) 0.1 % nasal spray Place 1 spray into both nostrils daily.    . busPIRone (BUSPAR) 15 MG tablet TAKE 1 TABLET THREE TIMES DAILY 270 tablet 0  . chlorpheniramine-HYDROcodone (TUSSIONEX PENNKINETIC ER) 10-8 MG/5ML SUER Take 5 mLs by mouth 2 (two) times daily. 140 mL 0  . cyclobenzaprine (FLEXERIL) 5 MG tablet Take 1 tablet (5 mg total) by mouth every 8 (eight) hours as needed for muscle spasms. 21 tablet 0  . cyclobenzaprine (FLEXERIL) 5 MG  tablet Take by mouth.    . fluticasone-salmeterol (ADVAIR HFA) 230-21 MCG/ACT inhaler Inhale 2 puffs into the lungs 2 (two) times daily.    . furosemide (LASIX) 40 MG tablet Take by mouth.    . gabapentin (NEURONTIN) 300 MG capsule TAKE 1 CAPSULE FOUR TIMES DAILY 360 capsule 0  . glipiZIDE (GLUCOTROL XL) 5 MG 24 hr tablet Take by mouth.    Marland Kitchen KLOR-CON M20 20 MEQ tablet     .  lamoTRIgine (LAMICTAL) 200 MG tablet TAKE 1 TABLET EVERY DAY 90 tablet 0  . levocetirizine (XYZAL) 5 MG tablet Take 5 mg by mouth at bedtime.     Marland Kitchen levocetirizine (XYZAL) 5 MG tablet Take by mouth.    . levothyroxine (SYNTHROID, LEVOTHROID) 200 MCG tablet Take 200 mcg by mouth daily.     . meloxicam (MOBIC) 15 MG tablet     . metoprolol succinate (TOPROL-XL) 25 MG 24 hr tablet Take by mouth.    . montelukast (SINGULAIR) 10 MG tablet Take 10 mg by mouth daily.     . Multiple Vitamin (MULTIVITAMIN WITH MINERALS) TABS tablet Take 1 tablet by mouth daily.    . naproxen (NAPROSYN) 500 MG tablet Take 1 tablet (500 mg total) by mouth 2 (two) times daily with a meal. 14 tablet 0  . Olopatadine HCl (PATADAY) 0.2 % SOLN Apply 1 drop to eye as needed (for dry eyes).    . ondansetron (ZOFRAN ODT) 4 MG disintegrating tablet Take 1 tablet (4 mg total) by mouth every 8 (eight) hours as needed for nausea or vomiting. 20 tablet 0  . oxyCODONE-acetaminophen (PERCOCET) 5-325 MG tablet Take 1 tablet by mouth every 4 (four) hours as needed for severe pain. 20 tablet 0  . promethazine (PHENERGAN) 25 MG tablet Take 25 mg by mouth.    . promethazine (PHENERGAN) 25 MG tablet Take by mouth.    . ranitidine (ZANTAC) 300 MG tablet Take 1 tablet (300 mg total) by mouth 2 (two) times daily. (Patient taking differently: Take 300 mg by mouth daily. ) 60 tablet 0  . ranolazine (RANEXA) 500 MG 12 hr tablet Take 500 mg by mouth 2 (two) times daily.    . solifenacin (VESICARE) 10 MG tablet Take 10 mg by mouth at bedtime.    Marland Kitchen tiotropium (SPIRIVA) 18 MCG  inhalation capsule Place 18 mcg into inhaler and inhale at bedtime.    . topiramate (TOPAMAX) 100 MG tablet TAKE 1 TABLET TWICE DAILY 180 tablet 0  . torsemide (DEMADEX) 20 MG tablet Take 20 mg by mouth daily.    . traMADol (ULTRAM) 50 MG tablet Take 1 tablet (50 mg total) by mouth every 6 (six) hours as needed. 20 tablet 0  . traZODone (DESYREL) 50 MG tablet Take 1 tablet (50 mg total) by mouth at bedtime. 90 tablet 0  . traZODone (DESYREL) 50 MG tablet TAKE 1 TABLET BY MOUTH DAILY AT BEDTIME 30 tablet 0  . fluticasone-salmeterol (ADVAIR HFA) 230-21 MCG/ACT inhaler Inhale into the lungs.     No current facility-administered medications for this visit.     Medical Decision Making:  Established Problem, Stable/Improving (1), Review of Medication Regimen & Side Effects (2) and Review of New Medication or Change in Dosage (2)  Treatment Plan Summary:Medication management and Plan   Discussed with patient about her medications. I will adjust her medications as follows Lamotrigine 200 mg daily I will start her on Lexapro 5 mg daily and then she will titrate the dose to 10 mg after 2 weeks. BuSpar 15 mg by mouth 3 times a day Topamax 100 mg by mouth twice a day Neurontin dose has been adjusted by her pain management and will be prescribed by them.   Patient will follow up in 2  month or earlier depending on her symptoms    More than 50% of the time spent in psychoeducation, counseling and coordination of care.    This note was generated in part or whole with  voice recognition software. Voice regonition is usually quite accurate but there are transcription errors that can and very often do occur. I apologize for any typographical errors that were not detected and corrected. Rainey Pines, MD  05/09/2016, 2:42 PM

## 2016-06-20 ENCOUNTER — Telehealth: Payer: Self-pay

## 2016-06-20 NOTE — Telephone Encounter (Signed)
received a fax requesting a 90 day supply of the escitalopram 10mg  .  pt was last seen 05-09-16 next appt  07-04-16

## 2016-06-24 ENCOUNTER — Emergency Department
Admission: EM | Admit: 2016-06-24 | Discharge: 2016-06-24 | Disposition: A | Discharge status: Home / Self Care | Payer: Medicare HMO | Source: Home / Self Care | Attending: Emergency Medicine | Admitting: Emergency Medicine

## 2016-06-24 ENCOUNTER — Encounter: Payer: Self-pay | Admitting: Intensive Care

## 2016-06-24 DIAGNOSIS — N183 Chronic kidney disease, stage 3 (moderate): Secondary | ICD-10-CM | POA: Insufficient documentation

## 2016-06-24 DIAGNOSIS — I130 Hypertensive heart and chronic kidney disease with heart failure and stage 1 through stage 4 chronic kidney disease, or unspecified chronic kidney disease: Secondary | ICD-10-CM

## 2016-06-24 DIAGNOSIS — J449 Chronic obstructive pulmonary disease, unspecified: Secondary | ICD-10-CM

## 2016-06-24 DIAGNOSIS — R531 Weakness: Secondary | ICD-10-CM | POA: Diagnosis not present

## 2016-06-24 DIAGNOSIS — E039 Hypothyroidism, unspecified: Secondary | ICD-10-CM

## 2016-06-24 DIAGNOSIS — I2510 Atherosclerotic heart disease of native coronary artery without angina pectoris: Secondary | ICD-10-CM | POA: Insufficient documentation

## 2016-06-24 DIAGNOSIS — R42 Dizziness and giddiness: Secondary | ICD-10-CM

## 2016-06-24 DIAGNOSIS — I509 Heart failure, unspecified: Secondary | ICD-10-CM | POA: Insufficient documentation

## 2016-06-24 DIAGNOSIS — E1165 Type 2 diabetes mellitus with hyperglycemia: Secondary | ICD-10-CM | POA: Insufficient documentation

## 2016-06-24 DIAGNOSIS — R51 Headache: Secondary | ICD-10-CM | POA: Insufficient documentation

## 2016-06-24 DIAGNOSIS — Z7984 Long term (current) use of oral hypoglycemic drugs: Secondary | ICD-10-CM | POA: Insufficient documentation

## 2016-06-24 DIAGNOSIS — E1122 Type 2 diabetes mellitus with diabetic chronic kidney disease: Secondary | ICD-10-CM | POA: Insufficient documentation

## 2016-06-24 DIAGNOSIS — Z8673 Personal history of transient ischemic attack (TIA), and cerebral infarction without residual deficits: Secondary | ICD-10-CM | POA: Insufficient documentation

## 2016-06-24 DIAGNOSIS — N189 Chronic kidney disease, unspecified: Secondary | ICD-10-CM | POA: Diagnosis not present

## 2016-06-24 DIAGNOSIS — J45909 Unspecified asthma, uncomplicated: Secondary | ICD-10-CM | POA: Diagnosis not present

## 2016-06-24 DIAGNOSIS — Z7982 Long term (current) use of aspirin: Secondary | ICD-10-CM | POA: Insufficient documentation

## 2016-06-24 DIAGNOSIS — R739 Hyperglycemia, unspecified: Secondary | ICD-10-CM

## 2016-06-24 DIAGNOSIS — Z79899 Other long term (current) drug therapy: Secondary | ICD-10-CM

## 2016-06-24 DIAGNOSIS — R079 Chest pain, unspecified: Secondary | ICD-10-CM | POA: Insufficient documentation

## 2016-06-24 DIAGNOSIS — I252 Old myocardial infarction: Secondary | ICD-10-CM | POA: Insufficient documentation

## 2016-06-24 DIAGNOSIS — Z87891 Personal history of nicotine dependence: Secondary | ICD-10-CM

## 2016-06-24 LAB — BASIC METABOLIC PANEL
ANION GAP: 11 (ref 5–15)
BUN: 12 mg/dL (ref 6–20)
CALCIUM: 9 mg/dL (ref 8.9–10.3)
CHLORIDE: 102 mmol/L (ref 101–111)
CO2: 26 mmol/L (ref 22–32)
CREATININE: 1.04 mg/dL — AB (ref 0.44–1.00)
GFR calc non Af Amer: >60 mL/min (ref >60)
Glucose, Bld: 208 mg/dL — ABNORMAL HIGH (ref 65–99)
Potassium: 3 mmol/L — ABNORMAL LOW (ref 3.5–5.1)
SODIUM: 139 mmol/L (ref 135–145)

## 2016-06-24 LAB — CBC WITH DIFFERENTIAL/PLATELET
BASOS ABS: 0 10*3/uL (ref 0–0.1)
BASOS PCT: 0 %
EOS ABS: 0.2 10*3/uL (ref 0–0.7)
Eosinophils Relative: 2 %
HEMATOCRIT: 36 % (ref 35.0–47.0)
HEMOGLOBIN: 12.5 g/dL (ref 12.0–16.0)
Lymphocytes Relative: 28 %
Lymphs Abs: 2.5 10*3/uL (ref 1.0–3.6)
MCH: 32.7 pg (ref 26.0–34.0)
MCHC: 34.8 g/dL (ref 32.0–36.0)
MCV: 93.8 fL (ref 80.0–100.0)
MONO ABS: 0.5 10*3/uL (ref 0.2–0.9)
MONOS PCT: 6 %
NEUTROS ABS: 5.5 10*3/uL (ref 1.4–6.5)
NEUTROS PCT: 64 %
Platelets: 207 10*3/uL (ref 150–440)
RBC: 3.84 MIL/uL (ref 3.80–5.20)
RDW: 14.3 % (ref 11.5–14.5)
WBC: 8.7 10*3/uL (ref 3.6–11.0)

## 2016-06-24 LAB — GLUCOSE, CAPILLARY
GLUCOSE-CAPILLARY: 176 mg/dL — AB (ref 65–99)
GLUCOSE-CAPILLARY: 202 mg/dL — AB (ref 65–99)
Glucose-Capillary: 196 mg/dL — ABNORMAL HIGH (ref 65–99)

## 2016-06-24 LAB — URINALYSIS, COMPLETE (UACMP) WITH MICROSCOPIC
BACTERIA UA: NONE SEEN
BILIRUBIN URINE: NEGATIVE
Bacteria, UA: NONE SEEN
Bilirubin Urine: NEGATIVE
GLUCOSE, UA: NEGATIVE mg/dL
Glucose, UA: NEGATIVE mg/dL
HGB URINE DIPSTICK: NEGATIVE
HGB URINE DIPSTICK: NEGATIVE
KETONES UR: NEGATIVE mg/dL
KETONES UR: NEGATIVE mg/dL
LEUKOCYTES UA: NEGATIVE
LEUKOCYTES UA: NEGATIVE
NITRITE: NEGATIVE
Nitrite: NEGATIVE
PH: 6 (ref 5.0–8.0)
PROTEIN: NEGATIVE mg/dL
Protein, ur: NEGATIVE mg/dL
RBC / HPF: NONE SEEN RBC/hpf (ref 0–5)
RBC / HPF: NONE SEEN RBC/hpf (ref 0–5)
SPECIFIC GRAVITY, URINE: 1.003 — AB (ref 1.005–1.030)
Specific Gravity, Urine: 1.006 (ref 1.005–1.030)
WBC, UA: NONE SEEN WBC/hpf (ref 0–5)
pH: 5 (ref 5.0–8.0)

## 2016-06-24 LAB — CBC
HCT: 36.7 % (ref 35.0–47.0)
Hemoglobin: 12.4 g/dL (ref 12.0–16.0)
MCH: 31.5 pg (ref 26.0–34.0)
MCHC: 33.8 g/dL (ref 32.0–36.0)
MCV: 93.1 fL (ref 80.0–100.0)
PLATELETS: 219 10*3/uL (ref 150–440)
RBC: 3.94 MIL/uL (ref 3.80–5.20)
RDW: 14.3 % (ref 11.5–14.5)
WBC: 7.7 10*3/uL (ref 3.6–11.0)

## 2016-06-24 MED ORDER — SODIUM CHLORIDE 0.9 % IV BOLUS (SEPSIS)
1000.0000 mL | Freq: Once | INTRAVENOUS | Status: AC
  Administered 2016-06-24: 1000 mL via INTRAVENOUS

## 2016-06-24 MED ORDER — PROCHLORPERAZINE EDISYLATE 5 MG/ML IJ SOLN
10.0000 mg | Freq: Once | INTRAMUSCULAR | Status: AC
  Administered 2016-06-24: 10 mg via INTRAVENOUS
  Filled 2016-06-24 (×2): qty 2

## 2016-06-24 NOTE — ED Triage Notes (Addendum)
Pt reports arrival via ems, pt very sleepy, slurring her speech during triage and falling asleep during triage and in the middle of answering my questions, pt does state that she took her night time medications that do maker her sleepy. pt states that she was seen earlier for the same symptoms and diagnosed with dehydration pt reports dizziness, weakness, and lower back pain.

## 2016-06-24 NOTE — ED Notes (Signed)
Pt presents with dizziness x 3 weeks; states she has had migraines for years and takes topamax; they also have been worse x 3 weeks. Pt states her blood glucose has been fluctuating. She states it was 54 2 weeks ago so she ate 2 reese cups. She states she has never been told what she should or should not ear. She states her diet today has consisted of a cup of pudding, some chicken, okra, a roll. Pt states that her heart is "fluttering", which has been happening for 3 weeks as well. Pt alert & oriented with NAD noted.

## 2016-06-24 NOTE — ED Triage Notes (Signed)
Patient presents to ER today with dizziness that has been going on X3 weeks that has gradually gotten worse. Patient also c/o headaches recently and blurred vision on and off and fluctuating blood sugar. Patient states she fell and hit her head a couple months ago but never told anyone and was not checked out. A&O X4 in triage. No distress noted. Blood Sugar 202 in triage

## 2016-06-24 NOTE — ED Provider Notes (Signed)
[facility] Emergency Department Provider Note   ____________________________________________   I have reviewed the triage vital signs and the nursing notes.   HISTORY  Chief Complaint Dizziness   History limited by: Not Limited   HPI Marie Clarke is a 46 y.o. female who presents to the emergency department today because of concern for dizziness. The patient states this is been going on for the past 3 weeks. She thinks it might be related to her blood sugars which she said have been running both high and low. She states it got a size of the 300s. The dizziness is accompanied by headache. The patient does have some concerns that she might be dehydrated.   Past Medical History:  Diagnosis Date  . Anemia   . Anxiety   . Asthma   . Bipolar disorder ([place])   . CAD (coronary artery disease) unk  . CHF (congestive heart failure) ([place])   . COPD (chronic obstructive pulmonary disease) ([place])   . Depression unk  . Diabetes mellitus without complication ([place])   . Diabetes mellitus, type II ([place])   . Drug overdose   . GERD (gastroesophageal reflux disease)   . Headache   . Hyperlipidemia   . Hypertension   . Left leg pain 04/29/2014  . MI (myocardial infarction) ([place])   . Muscle ache 09/16/2014  . Osteoporosis   . Overactive bladder   . Pancreatitis unk  . Reflex sympathetic dystrophy   . Renal insufficiency   . Sleep apnea    pt reported on 2/6/7 she currently is not using CPAP  . Sleep apnea   . Stroke ([place])   . Thyroid disease   . TIA (transient ischemic attack) unk  . TIA (transient ischemic attack)     Patient Active Problem List   Diagnosis Date Noted  . Contusion of knee, left 10/12/2015  . Sprain of left ankle 10/12/2015  . Strain of left knee 10/12/2015  . Ankle instability 07/20/2015  . Incidental lung nodule 04/28/2015  . Chronic pain 03/28/2015  . Chronic low back pain (Location of Primary Source of Pain) (Bilateral) (L>R) 03/28/2015  .  Chronic lower extremity pain (Left) 03/28/2015  . Abdominal wound dehiscence 03/28/2015  . Encounter for pain management planning 03/28/2015  . Morbid obesity ([place]) 03/28/2015  . Abnormal CT scan, lumbar spine 03/28/2015  . Lumbar facet hypertrophy 03/28/2015  . Lumbar facet syndrome (Location of Primary Source of Pain) (Bilateral) (L>R) 03/28/2015  . Lumbar foraminal stenosis (Bilateral) (L5-S1) 03/28/2015  . Chronic ankle pain (Left) 03/28/2015  . CRPS (complex regional pain syndrome) type I of lower limb (left ankle) 03/28/2015  . Neurogenic pain 03/28/2015  . Neuropathic pain 03/28/2015  . Myofascial pain 03/28/2015  . History of suicide attempt 03/28/2015  . 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 Abnormal gait 12/15/2014  . Congestive heart failure ([place]) 11/15/2014  . Abdominal wall abscess 09/20/2014  . 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 Diabetes mellitus, type 2 ([place]) 08/13/2014  . Bipolar affective disorder ([place]) 08/13/2014  . Type 2 diabetes mellitus ([place]) 08/13/2014  . Rectal prolapse 08/09/2014  . Rectal bleeding 08/09/2014  . Rectal bleed 08/09/2014  . Affective bipolar disorder ([place]) 08/05/2014  . Arteriosclerosis of coronary artery 08/05/2014  . CCF (congestive cardiac failure) ([place]) 08/05/2014  . Chronic kidney disease 08/05/2014  . 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 Apnea, sleep 08/05/2014  . Temporary cerebral vascular dysfunction 08/05/2014  . Polypharmacy 04/29/2014  . Other long term (current) drug therapy 04/29/2014  .  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 Chronic kidney disease, stage III (moderate) 12/14/2013  . Controlled diabetes mellitus type II without complication ([place]) 05/03/2246  . Essential (primary) hypertension 12/03/2013  . Adult hypothyroidism 12/03/2013  . Controlled type 2 diabetes mellitus without complication ([place]) 25/00/3704    Past Surgical History:  Procedure Laterality Date  . ABDOMINAL HYSTERECTOMY    . prolapse rectum  surgery N/A July 2016  . TONSILLECTOMY      Prior to Admission medications   Medication Sig Start Date End Date Taking? Authorizing Provider  albuterol (PROVENTIL HFA;VENTOLIN HFA) 108 (90 BASE) MCG/ACT inhaler Inhale 2 puffs into the lungs every 4 (four) hours as needed for wheezing or shortness of breath.    [provider]  aspirin EC 81 MG tablet Take 81 mg by mouth daily at 12 noon.    [provider]  atorvastatin (LIPITOR) 40 MG tablet Take 40 mg by mouth at bedtime.     [provider]  atorvastatin (LIPITOR) 40 MG tablet Take by mouth. 09/27/15 08/04/16  [provider]  azelastine (ASTELIN) 0.1 % nasal spray Place 1 spray into both nostrils daily.    [provider]  busPIRone (BUSPAR) 15 MG tablet Take 1 tablet (15 mg total) by mouth 3 (three) times daily. 05/09/16   Rainey Pines, MD  chlorpheniramine-HYDROcodone (TUSSIONEX PENNKINETIC ER) 10-8 MG/5ML SUER Take 5 mLs by mouth 2 (two) times daily. 02/25/16   Earleen Newport, MD  cyclobenzaprine (FLEXERIL) 5 MG tablet Take 1 tablet (5 mg total) by mouth every 8 (eight) hours as needed for muscle spasms. 08/02/15   Hagler, Jami L, PA-C  cyclobenzaprine (FLEXERIL) 5 MG tablet Take by mouth. 09/27/15 08/04/16  [provider]  escitalopram (LEXAPRO) 10 MG tablet 1/2 pill daily x 2 weeks then 1 pill daily 05/09/16   Rainey Pines, MD  fluticasone-salmeterol (ADVAIR HFA) 230-21 MCG/ACT inhaler Inhale 2 puffs into the lungs 2 (two) times daily.    [provider]  fluticasone-salmeterol (ADVAIR HFA) 230-21 MCG/ACT inhaler Inhale into the lungs. 04/28/15 04/27/16  [provider]  furosemide (LASIX) 40 MG tablet Take by mouth. 09/27/15 08/04/16  [provider]  gabapentin (NEURONTIN) 600 MG tablet Take by mouth. 04/19/16   [provider]  glipiZIDE (GLUCOTROL XL) 5 MG 24 hr tablet Take by mouth. 09/27/15 08/04/16  [provider]  glipiZIDE (GLUCOTROL XL)  5 MG 24 hr tablet Take by mouth. 03/20/16 01/26/17  [provider]  KLOR-CON M20 20 MEQ tablet  10/13/15   [provider]  lamoTRIgine (LAMICTAL) 200 MG tablet Take 1 tablet (200 mg total) by mouth daily. 05/09/16   Rainey Pines, MD  levocetirizine (XYZAL) 5 MG tablet Take 5 mg by mouth at bedtime.     [provider]  levocetirizine Harlow Ohms) 5 MG tablet Take by mouth. 10/04/15 10/03/16  [provider]  levothyroxine (SYNTHROID, LEVOTHROID) 200 MCG tablet Take 200 mcg by mouth daily.     [provider]  meloxicam (MOBIC) 15 MG tablet  09/28/15   [provider]  metoprolol succinate (TOPROL-XL) 25 MG 24 hr tablet Take by mouth. 09/27/15 08/04/16  [provider]  montelukast (SINGULAIR) 10 MG tablet Take 10 mg by mouth daily.     [provider]  Multiple Vitamin (MULTIVITAMIN WITH MINERALS) TABS tablet Take 1 tablet by mouth daily.    [provider]  naproxen (NAPROSYN) 500 MG tablet Take 1 tablet (500 mg total) by mouth 2 (two)  times daily with a meal. 08/02/15   Hagler, Jami L, PA-C  Olopatadine HCl (PATADAY) 0.2 % SOLN Apply 1 drop to eye as needed (for dry eyes).    [provider]  ondansetron (ZOFRAN ODT) 4 MG disintegrating tablet Take 1 tablet (4 mg total) by mouth every 8 (eight) hours as needed for nausea or vomiting. 02/25/16   Earleen Newport, MD  oxyCODONE-acetaminophen (PERCOCET) 5-325 MG tablet Take 1 tablet by mouth every 4 (four) hours as needed for severe pain. 07/19/15   [name], PA-C  promethazine (PHENERGAN) 25 MG tablet Take 25 mg by mouth. 03/16/15   [provider]  promethazine (PHENERGAN) 25 MG tablet Take by mouth. 09/27/15 08/04/16  [provider]  ranitidine (ZANTAC) 300 MG tablet Take 1 tablet (300 mg total) by mouth 2 (two) times daily. Patient taking differently: Take 300 mg by mouth daily.  11/30/14   Carrie Mew, MD  ranolazine (RANEXA) 500 MG 12 hr  tablet Take 500 mg by mouth 2 (two) times daily.    [provider]  tiotropium (SPIRIVA) 18 MCG inhalation capsule Place 18 mcg into inhaler and inhale at bedtime.    [provider]  topiramate (TOPAMAX) 100 MG tablet Take 1 tablet (100 mg total) by mouth 2 (two) times daily. 05/09/16   Rainey Pines, MD  torsemide (DEMADEX) 20 MG tablet Take 20 mg by mouth daily.    [provider]    Allergies Diazepam; Sulfa antibiotics; Cephalexin; Ciprofloxacin; Flagyl [metronidazole]; Levofloxacin; Vancomycin; Penicillins; and Pregabalin  Family History  Problem Relation Age of Onset  . Diabetes Mellitus II Mother   . CAD Mother   . Sleep apnea Mother   . Osteoarthritis Mother   . Osteoporosis Mother   . Anxiety disorder Mother   . Depression Mother   . Bipolar disorder Mother   . Bipolar disorder Father   . Hypertension Father   . Depression Father   . Anxiety disorder Father   . Post-traumatic stress disorder Sister     Social History Social History  Substance Use Topics  . Smoking status: Former Smoker    Packs/day: 1.00    Types: Cigarettes    Quit date: 06/12/2016  . Smokeless tobacco: Never Used  . Alcohol use No    Review of Systems Constitutional: No fever/chills Eyes: Positive for blurry vision. ENT: Positive for ear ringing. Cardiovascular: Denies chest pain. Respiratory: Denies shortness of breath. Gastrointestinal: Positive for abdominal pain. Genitourinary: Negative for dysuria. Musculoskeletal: Negative for back pain. Skin: Negative for rash. Neurological: Positive for headache.   ____________________________________________   PHYSICAL EXAM:  VITAL SIGNS: ED Triage Vitals  Enc Vitals Group     BP 06/24/16 1349 (!) 116/56     Pulse Rate 06/24/16 1349 (!) 105     Resp 06/24/16 1349 16     Temp 06/24/16 1349 98.8 F (37.1 C)     Temp Source 06/24/16 1349 Oral     SpO2 06/24/16 1349 94 %     Weight 06/24/16 1350 225 lb (102.1 kg)      Height 06/24/16 1350 5\' 4"  (1.626 m)     Head Circumference --      Peak Flow --      Pain Score 06/24/16 1401 9     Pain Loc --      Pain Edu? --      Excl. in [place]? --      Constitutional: Alert and oriented. Well appearing and in  no distress. Eyes: Conjunctivae are normal. Normal extraocular movements. ENT   Head: Normocephalic and atraumatic.   Nose: No congestion/rhinnorhea.   Mouth/Throat: Mucous membranes are moist.   Neck: No stridor. Hematological/Lymphatic/Immunilogical: No cervical lymphadenopathy. Cardiovascular: Normal rate, regular rhythm.  No murmurs, rubs, or gallops.  Respiratory: Normal respiratory effort without tachypnea nor retractions. Breath sounds are clear and equal bilaterally. No wheezes/rales/rhonchi. Gastrointestinal: Soft and non tender. No rebound. No guarding.  Genitourinary: Deferred Musculoskeletal: Normal range of motion in all extremities. Trace bilateral lower extremity edema.  Neurologic:  Normal speech and language. No gross focal neurologic deficits are appreciated.  Skin:  Skin is warm, dry and intact. No rash noted. Psychiatric: Mood and affect are normal. Speech and behavior are normal. Patient exhibits appropriate insight and judgment.  ____________________________________________    LABS (pertinent positives/negatives)  Labs Reviewed  BASIC METABOLIC PANEL - Abnormal; Notable for the following:       Result Value   Potassium 3.0 (*)    Glucose, Bld 208 (*)    Creatinine, Ser 1.04 (*)    All other components within normal limits  URINALYSIS, COMPLETE (UACMP) WITH MICROSCOPIC - Abnormal; Notable for the following:    Color, Urine STRAW (*)    APPearance CLEAR (*)    Squamous Epithelial / LPF 0-5 (*)    All other components within normal limits  GLUCOSE, CAPILLARY - Abnormal; Notable for the following:    Glucose-Capillary 202 (*)    All other components within normal limits  GLUCOSE, CAPILLARY - Abnormal; Notable  for the following:    Glucose-Capillary 196 (*)    All other components within normal limits  CBC  CBG MONITORING, ED     ____________________________________________   EKG  I, Nance Pear, attending physician, personally viewed and interpreted this EKG  EKG Time: 1343 Rate: 101 Rhythm: sinus tachycardia Axis: normal Intervals: qtc 451 QRS: narrow ST changes: no st elevation Impression: sinus tachycardia, otherwise normal  ____________________________________________    RADIOLOGY  None  ____________________________________________   PROCEDURES  Procedures  ____________________________________________   INITIAL IMPRESSION / ASSESSMENT AND PLAN / ED COURSE  Pertinent labs & imaging results that were available during my care of the patient were reviewed by me and considered in my medical decision making (see chart for details).  Patient's blood work was concerning for some mild dehydration. Patient was given IV fluids as well as Compazine. Patient stated that it did improve her headache. She did feel improved. This point think patient is safe for discharge. Will outpatient follow-up with primary care.  ____________________________________________   FINAL CLINICAL IMPRESSION(S) / ED DIAGNOSES  Final diagnoses:  Dizziness  Hyperglycemia     Note: This dictation was prepared with Dragon dictation. Any transcriptional errors that result from this process are unintentional     Nance Pear, MD 06/24/16 [phone]

## 2016-06-24 NOTE — Discharge Instructions (Signed)
Please seek medical attention for any high fevers, chest pain, shortness of breath, change in behavior, persistent vomiting, bloody stool or any other new or concerning symptoms.  

## 2016-06-24 NOTE — ED Notes (Signed)
Patient was given a specimen cup but said that she did not have to use the restroom at this time.

## 2016-06-25 ENCOUNTER — Emergency Department: Payer: Medicare HMO

## 2016-06-25 ENCOUNTER — Encounter: Payer: Self-pay | Admitting: Radiology

## 2016-06-25 ENCOUNTER — Emergency Department
Admission: EM | Admit: 2016-06-25 | Discharge: 2016-06-25 | Disposition: A | Discharge status: Home / Self Care | Payer: Medicare HMO | Attending: Emergency Medicine | Admitting: Emergency Medicine

## 2016-06-25 DIAGNOSIS — R42 Dizziness and giddiness: Secondary | ICD-10-CM

## 2016-06-25 DIAGNOSIS — R079 Chest pain, unspecified: Secondary | ICD-10-CM

## 2016-06-25 DIAGNOSIS — R531 Weakness: Secondary | ICD-10-CM

## 2016-06-25 LAB — COMPREHENSIVE METABOLIC PANEL
ALBUMIN: 3.9 g/dL (ref 3.5–5.0)
ALT: 17 U/L (ref 14–54)
ANION GAP: 8 (ref 5–15)
AST: 18 U/L (ref 15–41)
Alkaline Phosphatase: 82 U/L (ref 38–126)
BILIRUBIN TOTAL: 0.3 mg/dL (ref 0.3–1.2)
BUN: 10 mg/dL (ref 6–20)
CO2: 26 mmol/L (ref 22–32)
Calcium: 9 mg/dL (ref 8.9–10.3)
Chloride: 104 mmol/L (ref 101–111)
Creatinine, Ser: 0.9 mg/dL (ref 0.44–1.00)
GFR calc non Af Amer: >60 mL/min (ref >60)
GLUCOSE: 203 mg/dL — AB (ref 65–99)
POTASSIUM: 3.1 mmol/L — AB (ref 3.5–5.1)
Sodium: 138 mmol/L (ref 135–145)
TOTAL PROTEIN: 7.3 g/dL (ref 6.5–8.1)

## 2016-06-25 LAB — TROPONIN I

## 2016-06-25 LAB — URINE DRUG SCREEN, QUALITATIVE (ARMC ONLY)
Amphetamines, Ur Screen: NOT DETECTED
BARBITURATES, UR SCREEN: NOT DETECTED
Benzodiazepine, Ur Scrn: NOT DETECTED
CANNABINOID 50 NG, UR ~~LOC~~: NOT DETECTED
COCAINE METABOLITE, UR ~~LOC~~: NOT DETECTED
MDMA (Ecstasy)Ur Screen: NOT DETECTED
Methadone Scn, Ur: NOT DETECTED
Opiate, Ur Screen: NOT DETECTED
Phencyclidine (PCP) Ur S: NOT DETECTED
TRICYCLIC, UR SCREEN: NOT DETECTED

## 2016-06-25 LAB — ETHANOL

## 2016-06-25 MED ORDER — IOPAMIDOL (ISOVUE-370) INJECTION 76%
75.0000 mL | Freq: Once | INTRAVENOUS | Status: AC | PRN
  Administered 2016-06-25: 75 mL via INTRAVENOUS

## 2016-06-25 NOTE — ED Notes (Signed)
MD in room to give update on results and plan of care at this time.  Will continue to monitor.

## 2016-06-25 NOTE — Discharge Instructions (Signed)
Please follow up with your primary care physician as discussed.

## 2016-06-25 NOTE — ED Provider Notes (Signed)
[facility] Emergency Department Provider Note   ____________________________________________   First MD Initiated Contact with Patient 06/25/16 [phone]     (approximate)  I have reviewed the triage vital signs and the nursing notes.   HISTORY  Chief Complaint Dizziness    HPI Marie Clarke is a 46 y.o. female who comes into the hospital today with some chest pain, lightheadedness and dizziness. The patient reports that her sugars have been running high and low. She reports that she feels weakness ever had this before. She has left lateral chest pain that she rates a 10 out of 10 in intensity. The patient was here earlier this afternoon and was told that she was dehydrated. She was given fluids and sent home but the symptoms started again. The patient states that she drank some fluids at home. She has not been a steady on her feet. She reports she's had some shortness of breath but denies any nausea or vomiting. She has a funny taste in her mouth. The patient took some Compazine at home. She denies any numbness or tingling but reports that she feels weak on the right side especially since she's had a mini stroke in the past. The patient decided to come back into the hospital for further evaluation.   Past Medical History:  Diagnosis Date  . Anemia   . Anxiety   . Asthma   . Bipolar disorder ([place])   . CAD (coronary artery disease) unk  . CHF (congestive heart failure) ([place])   . COPD (chronic obstructive pulmonary disease) ([place])   . Depression unk  . Diabetes mellitus without complication ([place])   . Diabetes mellitus, type II ([place])   . Drug overdose   . GERD (gastroesophageal reflux disease)   . Headache   . Hyperlipidemia   . Hypertension   . Left leg pain 04/29/2014  . MI (myocardial infarction) ([place])   . Muscle ache 09/16/2014  . Osteoporosis   . Overactive bladder   . Pancreatitis unk  . Reflex sympathetic dystrophy   . Renal insufficiency   . Sleep  apnea    pt reported on 2/6/7 she currently is not using CPAP  . Sleep apnea   . Stroke ([place])   . Thyroid disease   . TIA (transient ischemic attack) unk  . TIA (transient ischemic attack)     Patient Active Problem List   Diagnosis Date Noted  . Contusion of knee, left 10/12/2015  . Sprain of left ankle 10/12/2015  . Strain of left knee 10/12/2015  . Ankle instability 07/20/2015  . Incidental lung nodule 04/28/2015  . Chronic pain 03/28/2015  . Chronic low back pain (Location of Primary Source of Pain) (Bilateral) (L>R) 03/28/2015  . Chronic lower extremity pain (Left) 03/28/2015  . Abdominal wound dehiscence 03/28/2015  . Encounter for pain management planning 03/28/2015  . Morbid obesity ([place]) 03/28/2015  . Abnormal CT scan, lumbar spine 03/28/2015  . Lumbar facet hypertrophy 03/28/2015  . Lumbar facet syndrome (Location of Primary Source of Pain) (Bilateral) (L>R) 03/28/2015  . Lumbar foraminal stenosis (Bilateral) (L5-S1) 03/28/2015  . Chronic ankle pain (Left) 03/28/2015  . CRPS (complex regional pain syndrome) type I of lower limb (left ankle) 03/28/2015  . Neurogenic pain 03/28/2015  . Neuropathic pain 03/28/2015  . Myofascial pain 03/28/2015  . History of suicide attempt 03/28/2015  . 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 Abnormal gait 12/15/2014  . Congestive heart failure ([place]) 11/15/2014  . Abdominal wall abscess 09/20/2014  .  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 Diabetes mellitus, type 2 ([place]) 08/13/2014  . Bipolar affective disorder ([place]) 08/13/2014  . Type 2 diabetes mellitus ([place]) 08/13/2014  . Rectal prolapse 08/09/2014  . Rectal bleeding 08/09/2014  . Rectal bleed 08/09/2014  . Affective bipolar disorder ([place]) 08/05/2014  . Arteriosclerosis of coronary artery 08/05/2014  . CCF (congestive cardiac failure) ([place]) 08/05/2014  . Chronic kidney disease 08/05/2014  . 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 Apnea, sleep 08/05/2014  . Temporary cerebral  vascular dysfunction 08/05/2014  . Polypharmacy 04/29/2014  . Other long term (current) drug therapy 04/29/2014  . 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 Chronic kidney disease, stage III (moderate) 12/14/2013  . Controlled diabetes mellitus type II without complication ([place]) 37/90/2409  . Essential (primary) hypertension 12/03/2013  . Adult hypothyroidism 12/03/2013  . Controlled type 2 diabetes mellitus without complication ([place]) 73/53/2992    Past Surgical History:  Procedure Laterality Date  . ABDOMINAL HYSTERECTOMY    . prolapse rectum surgery N/A July 2016  . TONSILLECTOMY      Prior to Admission medications   Medication Sig Start Date End Date Taking? Authorizing Provider  albuterol (PROVENTIL HFA;VENTOLIN HFA) 108 (90 BASE) MCG/ACT inhaler Inhale 2 puffs into the lungs every 4 (four) hours as needed for wheezing or shortness of breath.    [provider]  aspirin EC 81 MG tablet Take 81 mg by mouth daily at 12 noon.    [provider]  atorvastatin (LIPITOR) 40 MG tablet Take 40 mg by mouth at bedtime.     [provider]  atorvastatin (LIPITOR) 40 MG tablet Take by mouth. 09/27/15 08/04/16  [provider]  azelastine (ASTELIN) 0.1 % nasal spray Place 1 spray into both nostrils daily.    [provider]  busPIRone (BUSPAR) 15 MG tablet Take 1 tablet (15 mg total) by mouth 3 (three) times daily. 05/09/16   Rainey Pines, MD  chlorpheniramine-HYDROcodone (TUSSIONEX PENNKINETIC ER) 10-8 MG/5ML SUER Take 5 mLs by mouth 2 (two) times daily. 02/25/16   Earleen Newport, MD  cyclobenzaprine (FLEXERIL) 5 MG tablet Take 1 tablet (5 mg total) by mouth every 8 (eight) hours as needed for muscle spasms. 08/02/15   Hagler, Jami L, PA-C  cyclobenzaprine (FLEXERIL) 5 MG tablet Take by mouth. 09/27/15 08/04/16  [provider]  escitalopram (LEXAPRO) 10 MG tablet 1/2 pill daily x 2 weeks then 1 pill daily 05/09/16   Rainey Pines, MD    fluticasone-salmeterol (ADVAIR HFA) 230-21 MCG/ACT inhaler Inhale 2 puffs into the lungs 2 (two) times daily.    [provider]  fluticasone-salmeterol (ADVAIR HFA) 230-21 MCG/ACT inhaler Inhale into the lungs. 04/28/15 04/27/16  [provider]  furosemide (LASIX) 40 MG tablet Take by mouth. 09/27/15 08/04/16  [provider]  gabapentin (NEURONTIN) 600 MG tablet Take by mouth. 04/19/16   [provider]  glipiZIDE (GLUCOTROL XL) 5 MG 24 hr tablet Take by mouth. 09/27/15 08/04/16  [provider]  glipiZIDE (GLUCOTROL XL) 5 MG 24 hr tablet Take by mouth. 03/20/16 01/26/17  [provider]  KLOR-CON M20 20 MEQ tablet  10/13/15   [provider]  lamoTRIgine (LAMICTAL) 200 MG tablet Take 1 tablet (200 mg total) by mouth daily. 05/09/16   Rainey Pines, MD  levocetirizine (XYZAL) 5 MG tablet Take 5 mg by mouth at bedtime.     [provider]  levocetirizine Harlow Ohms) 5 MG tablet Take by mouth. 10/04/15 10/03/16  [provider]  levothyroxine (SYNTHROID,  LEVOTHROID) 200 MCG tablet Take 200 mcg by mouth daily.     [provider]  meloxicam (MOBIC) 15 MG tablet  09/28/15   [provider]  metoprolol succinate (TOPROL-XL) 25 MG 24 hr tablet Take by mouth. 09/27/15 08/04/16  [provider]  montelukast (SINGULAIR) 10 MG tablet Take 10 mg by mouth daily.     [provider]  Multiple Vitamin (MULTIVITAMIN WITH MINERALS) TABS tablet Take 1 tablet by mouth daily.    [provider]  naproxen (NAPROSYN) 500 MG tablet Take 1 tablet (500 mg total) by mouth 2 (two) times daily with a meal. 08/02/15   Hagler, Jami L, PA-C  Olopatadine HCl (PATADAY) 0.2 % SOLN Apply 1 drop to eye as needed (for dry eyes).    [provider]  ondansetron (ZOFRAN ODT) 4 MG disintegrating tablet Take 1 tablet (4 mg total) by mouth every 8 (eight) hours as needed for nausea or vomiting. 02/25/16   Earleen Newport, MD  oxyCODONE-acetaminophen (PERCOCET) 5-325 MG tablet Take 1 tablet by mouth every 4 (four) hours as needed for severe pain. 07/19/15   [name], PA-C  promethazine (PHENERGAN) 25 MG tablet Take 25 mg by mouth. 03/16/15   [provider]  promethazine (PHENERGAN) 25 MG tablet Take by mouth. 09/27/15 08/04/16  [provider]  ranitidine (ZANTAC) 300 MG tablet Take 1 tablet (300 mg total) by mouth 2 (two) times daily. Patient taking differently: Take 300 mg by mouth daily.  11/30/14   Carrie Mew, MD  ranolazine (RANEXA) 500 MG 12 hr tablet Take 500 mg by mouth 2 (two) times daily.    [provider]  tiotropium (SPIRIVA) 18 MCG inhalation capsule Place 18 mcg into inhaler and inhale at bedtime.    [provider]  topiramate (TOPAMAX) 100 MG tablet Take 1 tablet (100 mg total) by mouth 2 (two) times daily. 05/09/16   Rainey Pines, MD  torsemide (DEMADEX) 20 MG tablet Take 20 mg by mouth daily.    [provider]    Allergies Diazepam; Sulfa antibiotics; Cephalexin; Ciprofloxacin; Flagyl [metronidazole]; Levofloxacin; Vancomycin; Penicillins; and Pregabalin  Family History  Problem Relation Age of Onset  . Diabetes Mellitus II Mother   . CAD Mother   . Sleep apnea Mother   . Osteoarthritis Mother   . Osteoporosis Mother   . Anxiety disorder Mother   . Depression Mother   . Bipolar disorder Mother   . Bipolar disorder Father   . Hypertension Father   . Depression Father   . Anxiety disorder Father   . Post-traumatic stress disorder Sister     Social History Social History  Substance Use Topics  . Smoking status: Former Smoker    Packs/day: 1.00    Types: Cigarettes    Quit date: 06/12/2016  . Smokeless tobacco: Never Used  . Alcohol use No    Review of Systems  Constitutional: No fever/chills Eyes: No visual changes. ENT: No sore throat. Cardiovascular: Denies chest pain. Respiratory: Denies shortness of  breath. Gastrointestinal: No abdominal pain.  No nausea, no vomiting.  No diarrhea.  No constipation. Genitourinary: Negative for dysuria. Musculoskeletal: Negative for back pain. Skin: Negative for rash. Neurological: Dizziness and right-sided weakness.   ____________________________________________   PHYSICAL EXAM:  VITAL SIGNS: ED Triage Vitals [06/24/16 2246]  Enc Vitals Group     BP (!) 98/55     Pulse Rate 92     Resp 18     Temp  98.8 F (37.1 C)     Temp Source Oral     SpO2 96 %     Weight 225 lb (102.1 kg)     Height 5\' 4"  (1.626 m)     Head Circumference      Peak Flow      Pain Score 10     Pain Loc      Pain Edu?      Excl. in [place]?     Constitutional: Alert and oriented. Well appearing and in Moderate distress. Eyes: Conjunctivae are normal. PERRL. EOMI. Head: Atraumatic. Nose: No congestion/rhinnorhea. Mouth/Throat: Mucous membranes are moist.  Oropharynx non-erythematous. Cardiovascular: Normal rate, regular rhythm. Grossly normal heart sounds.  Good peripheral circulation. Respiratory: Normal respiratory effort.  No retractions. Lungs CTAB. Gastrointestinal: Soft and nontender. No distention. Positive bowel sounds Musculoskeletal: No lower extremity tenderness nor edema.   Neurologic:  Normal speech and language. Cranial nerves II through XII are grossly intact with no focal motor or neuro deficits Skin:  Skin is warm, dry and intact. Marland Kitchen Psychiatric: Mood and affect are normal.   ____________________________________________   LABS (all labs ordered are listed, but only abnormal results are displayed)  Labs Reviewed  URINALYSIS, COMPLETE (UACMP) WITH MICROSCOPIC - Abnormal; Notable for the following:       Result Value   Color, Urine STRAW (*)    APPearance CLEAR (*)    Specific Gravity, Urine 1.003 (*)    Squamous Epithelial / LPF 0-5 (*)    All other components within normal limits  COMPREHENSIVE METABOLIC PANEL - Abnormal; Notable for the  following:    Potassium 3.1 (*)    Glucose, Bld 203 (*)    All other components within normal limits  GLUCOSE, CAPILLARY - Abnormal; Notable for the following:    Glucose-Capillary 176 (*)    All other components within normal limits  CBC WITH DIFFERENTIAL/PLATELET  TROPONIN I  URINE DRUG SCREEN, QUALITATIVE ([facility] ONLY)  ETHANOL  TROPONIN I  CBG MONITORING, ED   ____________________________________________  EKG  none ____________________________________________  RADIOLOGY  CT angio head and neck ____________________________________________   PROCEDURES  Procedure(s) performed: None  Procedures  Critical Care performed: No  ____________________________________________   INITIAL IMPRESSION / ASSESSMENT AND PLAN / ED COURSE  Pertinent labs & imaging results that were available during my care of the patient were reviewed by me and considered in my medical decision making (see chart for details).  This is a 46 year old female who comes into the hospital today with some chest pain and lightheadedness and dizziness. I will send the patient for a CT angiogram of her head and neck. I will also perform a troponin on the patient. She is sleeping at this time and did have a hard time staying awake. She told triage that she had taken some sleeping medicine before coming into the hospital. I will then reassess the patient.  Clinical Course as of Jun 25 724  Mon Jun 25, 2016  7425 Improved interstitial thickening from prior exam, likely improved edema. No new abnormality is seen.   DG Chest Portable 1 View [AW]  9563 1. No acute intracranial abnormality identified. No abnormal enhancement of the brain. 2. Patent carotid and vertebral arteries of the neck. No large vessel occlusion, aneurysm, or significant stenosis is identified. 3. Patent circle of Willis. No large vessel occlusion, aneurysm, or significant stenosis is identified. 4. Multinodular thyroid with nodule in the  left lobe measuring up to 22 mm. Further characterization with  thyroid ultrasound is recommended on a nonemergent basis.   CT Angio Head W or Wo Contrast [AW]    Clinical Course User Index [AW] Dahlia Client Eduard Roux, MD   The patient slept throughout her time in the emergency department she reports that it was past her bedtime. She then woke up complaining that she hadn't seen an nurse or a physician. I did go into the room and discussed the patient's results. Her CT angios was unremarkable with patent vessels. I feel that the patient may have some dehydration contributing to her dizziness. The patient does have a history of CHF and does take Lasix 40 mg twice a day. I instructed the patient that she does need to be cautious of her fluid status especially given the high temperatures that has been around. The patient reports that she has an appointment with her primary care physician tomorrow. I discussed contacting her cardiologist Dr. Clayborn Bigness and having him discuss the amount of fluid the patient should be taking and each day. Otherwise the patient has no further chest pain and her dizziness is improved. She'll be discharged home.  ____________________________________________   FINAL CLINICAL IMPRESSION(S) / ED DIAGNOSES  Final diagnoses:  Dizziness  Weakness  Chest pain, unspecified type      NEW MEDICATIONS STARTED DURING THIS VISIT:  New Prescriptions   No medications on file     Note:  This document was prepared using Dragon voice recognition software and may include unintentional dictation errors.    Loney Hering, MD 06/25/16 [phone]

## 2016-07-03 ENCOUNTER — Emergency Department
Admission: EM | Admit: 2016-07-03 | Discharge: 2016-07-04 | Disposition: A | Discharge status: Home / Self Care | Payer: Medicare HMO | Attending: Emergency Medicine | Admitting: Emergency Medicine

## 2016-07-03 DIAGNOSIS — N183 Chronic kidney disease, stage 3 (moderate): Secondary | ICD-10-CM | POA: Diagnosis not present

## 2016-07-03 DIAGNOSIS — I509 Heart failure, unspecified: Secondary | ICD-10-CM | POA: Insufficient documentation

## 2016-07-03 DIAGNOSIS — I130 Hypertensive heart and chronic kidney disease with heart failure and stage 1 through stage 4 chronic kidney disease, or unspecified chronic kidney disease: Secondary | ICD-10-CM | POA: Diagnosis not present

## 2016-07-03 DIAGNOSIS — Z87891 Personal history of nicotine dependence: Secondary | ICD-10-CM | POA: Insufficient documentation

## 2016-07-03 DIAGNOSIS — J45909 Unspecified asthma, uncomplicated: Secondary | ICD-10-CM | POA: Insufficient documentation

## 2016-07-03 DIAGNOSIS — R0789 Other chest pain: Secondary | ICD-10-CM | POA: Diagnosis not present

## 2016-07-03 DIAGNOSIS — Z7984 Long term (current) use of oral hypoglycemic drugs: Secondary | ICD-10-CM | POA: Diagnosis not present

## 2016-07-03 DIAGNOSIS — R0602 Shortness of breath: Secondary | ICD-10-CM | POA: Insufficient documentation

## 2016-07-03 DIAGNOSIS — E1122 Type 2 diabetes mellitus with diabetic chronic kidney disease: Secondary | ICD-10-CM | POA: Insufficient documentation

## 2016-07-03 DIAGNOSIS — I2510 Atherosclerotic heart disease of native coronary artery without angina pectoris: Secondary | ICD-10-CM | POA: Insufficient documentation

## 2016-07-03 DIAGNOSIS — J449 Chronic obstructive pulmonary disease, unspecified: Secondary | ICD-10-CM | POA: Diagnosis not present

## 2016-07-03 DIAGNOSIS — Z7982 Long term (current) use of aspirin: Secondary | ICD-10-CM | POA: Insufficient documentation

## 2016-07-03 DIAGNOSIS — R600 Localized edema: Secondary | ICD-10-CM | POA: Insufficient documentation

## 2016-07-03 DIAGNOSIS — Z79899 Other long term (current) drug therapy: Secondary | ICD-10-CM | POA: Insufficient documentation

## 2016-07-03 DIAGNOSIS — R0600 Dyspnea, unspecified: Secondary | ICD-10-CM

## 2016-07-04 ENCOUNTER — Other Ambulatory Visit: Payer: Self-pay | Admitting: Family Medicine

## 2016-07-04 ENCOUNTER — Emergency Department: Payer: Medicare HMO

## 2016-07-04 ENCOUNTER — Ambulatory Visit (INDEPENDENT_AMBULATORY_CARE_PROVIDER_SITE_OTHER): Payer: Medicare HMO | Admitting: Psychiatry

## 2016-07-04 ENCOUNTER — Encounter: Payer: Self-pay | Admitting: Emergency Medicine

## 2016-07-04 ENCOUNTER — Encounter: Payer: Self-pay | Admitting: Psychiatry

## 2016-07-04 VITALS — BP 114/77 | HR 106 | Temp 98.6°F | Wt 230.6 lb

## 2016-07-04 DIAGNOSIS — F4310 Post-traumatic stress disorder, unspecified: Secondary | ICD-10-CM | POA: Diagnosis not present

## 2016-07-04 DIAGNOSIS — F3160 Bipolar disorder, current episode mixed, unspecified: Secondary | ICD-10-CM

## 2016-07-04 DIAGNOSIS — R0602 Shortness of breath: Secondary | ICD-10-CM | POA: Diagnosis not present

## 2016-07-04 DIAGNOSIS — E041 Nontoxic single thyroid nodule: Secondary | ICD-10-CM

## 2016-07-04 LAB — COMPREHENSIVE METABOLIC PANEL
ALBUMIN: 3.7 g/dL (ref 3.5–5.0)
ALK PHOS: 82 U/L (ref 38–126)
ALT: 14 U/L (ref 14–54)
AST: 22 U/L (ref 15–41)
Anion gap: 7 (ref 5–15)
BILIRUBIN TOTAL: 0.3 mg/dL (ref 0.3–1.2)
BUN: 10 mg/dL (ref 6–20)
CALCIUM: 9.1 mg/dL (ref 8.9–10.3)
CO2: 23 mmol/L (ref 22–32)
Chloride: 112 mmol/L — ABNORMAL HIGH (ref 101–111)
Creatinine, Ser: 0.83 mg/dL (ref 0.44–1.00)
GFR calc Af Amer: >60 mL/min (ref >60)
GFR calc non Af Amer: >60 mL/min (ref >60)
GLUCOSE: 173 mg/dL — AB (ref 65–99)
Potassium: 4 mmol/L (ref 3.5–5.1)
Sodium: 142 mmol/L (ref 135–145)
TOTAL PROTEIN: 6.6 g/dL (ref 6.5–8.1)

## 2016-07-04 LAB — CBC
HEMATOCRIT: 35.8 % (ref 35.0–47.0)
HEMOGLOBIN: 12.1 g/dL (ref 12.0–16.0)
MCH: 32 pg (ref 26.0–34.0)
MCHC: 33.9 g/dL (ref 32.0–36.0)
MCV: 94.5 fL (ref 80.0–100.0)
Platelets: 244 10*3/uL (ref 150–440)
RBC: 3.79 MIL/uL — AB (ref 3.80–5.20)
RDW: 15.1 % — ABNORMAL HIGH (ref 11.5–14.5)
WBC: 8.1 10*3/uL (ref 3.6–11.0)

## 2016-07-04 LAB — TROPONIN I: Troponin I: <0.03 ng/mL (ref <0.03)

## 2016-07-04 LAB — BRAIN NATRIURETIC PEPTIDE: B Natriuretic Peptide: 24 pg/mL (ref 0.0–100.0)

## 2016-07-04 MED ORDER — BUSPIRONE HCL 15 MG PO TABS: 15.0000 mg | ORAL_TABLET | Freq: Three times a day (TID) | ORAL | 0 refills | Status: DC

## 2016-07-04 MED ORDER — FUROSEMIDE 10 MG/ML PO SOLN
40.0000 mg | Freq: Once | ORAL | Status: AC
  Administered 2016-07-04: 40 mg via ORAL
  Filled 2016-07-04: qty 4

## 2016-07-04 MED ORDER — TOPIRAMATE 100 MG PO TABS: 100.0000 mg | ORAL_TABLET | Freq: Two times a day (BID) | ORAL | 0 refills | Status: DC

## 2016-07-04 MED ORDER — LAMOTRIGINE 200 MG PO TABS: 200.0000 mg | ORAL_TABLET | Freq: Every day | ORAL | 0 refills | Status: DC

## 2016-07-04 MED ORDER — FUROSEMIDE 8 MG/ML PO SOLN
40.0000 mg | Freq: Once | ORAL | Status: DC
  Filled 2016-07-04 (×2): qty 5

## 2016-07-04 NOTE — Telephone Encounter (Signed)
Pt was seen on  04-11-16 and again on  05-09-16

## 2016-07-04 NOTE — ED Notes (Signed)
Pt returned from xray

## 2016-07-04 NOTE — Progress Notes (Signed)
BH MD/PA/NP OP Progress Note  07/04/2016 3:05 PM Marie Clarke  MRN:  [id number]  Subjective:  Patient is a 46 year old female with history of bipolar disorder who presented for follow-up appointment.  She reported that she has recently got her primary glasses after 15 years and has started driving. She was very happy about the same. She was also discussing about her medications in detail. Patient reported that she wants to stop taking the Topamax as she is taking  too much medications. We discussed about her medications. She reported that she feels that the current medications are helping her. She feels calm and alert. She currently denied having any suicidal homicidal ideations or plans. She reported that she has been helping her aunt  take her to the work as she  has lost her driver's license due to DUI. Patient is sleeping well with the current combination the medications. She denied having any perceptual disturbances. She denied having any suicidal homicidal ideations or plans.     Chief Complaint:  Chief Complaint    Follow-up; Medication Refill     Visit Diagnosis:     ICD-9-CM ICD-10-CM   1. Bipolar I disorder, most recent episode mixed (HCC) 296.60 F31.60 busPIRone (BUSPAR) 15 MG tablet     lamoTRIgine (LAMICTAL) 200 MG tablet     topiramate (TOPAMAX) 100 MG tablet  2. PTSD (post-traumatic stress disorder) 309.81 F43.10     Past Medical History:  Past Medical History:  Diagnosis Date  . Anemia   . Anxiety   . Asthma   . Bipolar disorder ([place])   . CAD (coronary artery disease) unk  . CHF (congestive heart failure) ([place])   . COPD (chronic obstructive pulmonary disease) ([place])   . Depression unk  . Diabetes mellitus without complication ([place])   . Diabetes mellitus, type II ([place])   . Drug overdose   . GERD (gastroesophageal reflux disease)   . Headache   . Hyperlipidemia   . Hypertension   . Left leg pain 04/29/2014  . MI (myocardial infarction) ([place])   . Muscle ache 09/16/2014   . Osteoporosis   . Overactive bladder   . Pancreatitis unk  . Reflex sympathetic dystrophy   . Renal insufficiency   . Sleep apnea    pt reported on 2/6/7 she currently is not using CPAP  . Sleep apnea   . Stroke ([place])   . Thyroid disease   . TIA (transient ischemic attack) unk  . TIA (transient ischemic attack)     Past Surgical History:  Procedure Laterality Date  . ABDOMINAL HYSTERECTOMY    . prolapse rectum surgery N/A July 2016  . TONSILLECTOMY     Family History:  Family History  Problem Relation Age of Onset  . Diabetes Mellitus II Mother   . CAD Mother   . Sleep apnea Mother   . Osteoarthritis Mother   . Osteoporosis Mother   . Anxiety disorder Mother   . Depression Mother   . Bipolar disorder Mother   . Bipolar disorder Father   . Hypertension Father   . Depression Father   . Anxiety disorder Father   . Post-traumatic stress disorder Sister    Social History:  Social History   Social History  . Marital status: Single    Spouse name: N/A  . Number of children: N/A  . Years of education: N/A   Social History Main Topics  . Smoking status: Former Smoker    Packs/day: 1.00    Types: Cigarettes  Quit date: 06/12/2016  . Smokeless tobacco: Never Used  . Alcohol use No  . Drug use: No  . Sexual activity: Not Currently   Other Topics Concern  . None   Social History Narrative  . None   Additional History:   Assessment:   Musculoskeletal: Strength & Muscle Tone: within normal limits Gait & Station: Slow and ambulates with a cane Patient leans: N/A  Psychiatric Specialty Exam: Medication Refill     Review of Systems  Musculoskeletal: Positive for back pain and joint pain.  Psychiatric/Behavioral: Negative for hallucinations, memory loss, substance abuse and suicidal ideas. Depression: she's reporting some day-to-day fluctuation in her mood from depressed to being upbeat. The patient has insomnia.   All other systems reviewed and are  negative.   Blood pressure 114/77, pulse (!) 106, temperature 98.6 F (37 C), temperature source Oral, weight 230 lb 9.6 oz (104.6 kg).Body mass index is 39.58 kg/m.  General Appearance: Well Groomed  Eye Contact:  Good  Speech:  Slow  Volume:  Normal  Mood:  Anxious and Depressed  Affect:  Congruent  Thought Process:  Goal Directed  Orientation:  Full (Time, Place, and Person)  Thought Content:  Negative  Suicidal Thoughts:  No  Homicidal Thoughts:  No  Memory:  Immediate;   Good Recent;   Good Remote;   Good  Judgement:  Good  Insight:  Fair  Psychomotor Activity:  Normal  Concentration:  Good  Recall:  Good  Fund of Knowledge: Good  Language: Good  Akathisia:  Negative  Handed:  Right unknown   AIMS (if indicated):  N/A  Assets:  Desire for Improvement Social Support  ADL's:  Intact  Cognition: WNL  Sleep:  good   Is the patient at risk to self?  No. Has the patient been a risk to self in the past 6 months?  No. Has the patient been a risk to self within the distant past?  Yes.   Is the patient a risk to others?  No. Has the patient been a risk to others in the past 6 months?  No. Has the patient been a risk to others within the distant past?  No.  Current Medications: Current Outpatient Prescriptions  Medication Sig Dispense Refill  . albuterol (PROVENTIL HFA;VENTOLIN HFA) 108 (90 BASE) MCG/ACT inhaler Inhale 2 puffs into the lungs every 4 (four) hours as needed for wheezing or shortness of breath.    Marland Kitchen aspirin EC 81 MG tablet Take 81 mg by mouth daily at 12 noon.    Marland Kitchen atorvastatin (LIPITOR) 40 MG tablet Take 40 mg by mouth at bedtime.     Marland Kitchen atorvastatin (LIPITOR) 40 MG tablet Take by mouth.    Marland Kitchen azelastine (ASTELIN) 0.1 % nasal spray Place 1 spray into both nostrils daily.    . busPIRone (BUSPAR) 15 MG tablet Take 1 tablet (15 mg total) by mouth 3 (three) times daily. 270 tablet 0  . chlorpheniramine-HYDROcodone (TUSSIONEX PENNKINETIC ER) 10-8 MG/5ML SUER Take  5 mLs by mouth 2 (two) times daily. 140 mL 0  . cyclobenzaprine (FLEXERIL) 5 MG tablet Take 1 tablet (5 mg total) by mouth every 8 (eight) hours as needed for muscle spasms. 21 tablet 0  . cyclobenzaprine (FLEXERIL) 5 MG tablet Take by mouth.    . fluticasone-salmeterol (ADVAIR HFA) 230-21 MCG/ACT inhaler Inhale 2 puffs into the lungs 2 (two) times daily.    . furosemide (LASIX) 40 MG tablet Take by mouth.    . gabapentin (  NEURONTIN) 600 MG tablet Take by mouth.    Marland Kitchen glipiZIDE (GLUCOTROL XL) 5 MG 24 hr tablet Take by mouth.    Marland Kitchen glipiZIDE (GLUCOTROL XL) 5 MG 24 hr tablet Take by mouth.    Marland Kitchen KLOR-CON M20 20 MEQ tablet     . lamoTRIgine (LAMICTAL) 200 MG tablet Take 1 tablet (200 mg total) by mouth daily. 90 tablet 0  . levocetirizine (XYZAL) 5 MG tablet Take 5 mg by mouth at bedtime.     Marland Kitchen levocetirizine (XYZAL) 5 MG tablet Take by mouth.    . levothyroxine (SYNTHROID, LEVOTHROID) 200 MCG tablet Take 200 mcg by mouth daily.     . meloxicam (MOBIC) 15 MG tablet     . metoprolol succinate (TOPROL-XL) 25 MG 24 hr tablet Take by mouth.    . montelukast (SINGULAIR) 10 MG tablet Take 10 mg by mouth daily.     . Multiple Vitamin (MULTIVITAMIN WITH MINERALS) TABS tablet Take 1 tablet by mouth daily.    . naproxen (NAPROSYN) 500 MG tablet Take 1 tablet (500 mg total) by mouth 2 (two) times daily with a meal. 14 tablet 0  . Olopatadine HCl (PATADAY) 0.2 % SOLN Apply 1 drop to eye as needed (for dry eyes).    . ondansetron (ZOFRAN ODT) 4 MG disintegrating tablet Take 1 tablet (4 mg total) by mouth every 8 (eight) hours as needed for nausea or vomiting. 20 tablet 0  . oxyCODONE-acetaminophen (PERCOCET) 5-325 MG tablet Take 1 tablet by mouth every 4 (four) hours as needed for severe pain. 20 tablet 0  . promethazine (PHENERGAN) 25 MG tablet Take 25 mg by mouth.    . promethazine (PHENERGAN) 25 MG tablet Take by mouth.    . ranitidine (ZANTAC) 300 MG tablet Take 1 tablet (300 mg total) by mouth 2 (two) times  daily. (Patient taking differently: Take 300 mg by mouth daily. ) 60 tablet 0  . ranolazine (RANEXA) 500 MG 12 hr tablet Take 500 mg by mouth 2 (two) times daily.    Marland Kitchen tiotropium (SPIRIVA) 18 MCG inhalation capsule Place 18 mcg into inhaler and inhale at bedtime.    . topiramate (TOPAMAX) 100 MG tablet Take 1 tablet (100 mg total) by mouth 2 (two) times daily. 180 tablet 0  . torsemide (DEMADEX) 20 MG tablet Take 20 mg by mouth daily.    . fluticasone-salmeterol (ADVAIR HFA) 230-21 MCG/ACT inhaler Inhale into the lungs.     No current facility-administered medications for this visit.     Medical Decision Making:  Established Problem, Stable/Improving (1), Review of Medication Regimen & Side Effects (2) and Review of New Medication or Change in Dosage (2)  Treatment Plan Summary:Medication management and Plan   Discussed with patient about her medications. I will adjust her medications as follows Lamotrigine 200 mg daily BuSpar 15 mg by mouth 3 times a day Topamax 100 mg by mouth twice a day Neurontin dose has been adjusted by her pain management and will be prescribed by them.   Patient will follow up in 2  month or earlier depending on her symptoms    More than 50% of the time spent in psychoeducation, counseling and coordination of care.    This note was generated in part or whole with voice recognition software. Voice regonition is usually quite accurate but there are transcription errors that can and very often do occur. I apologize for any typographical errors that were not detected and corrected. Rainey Pines, MD  07/04/2016, 3:05 PM

## 2016-07-04 NOTE — ED Triage Notes (Signed)
Pt arrived via EMS with c/o sob. Pt has hx of sleep apnea and woke up feeling like she was drowning. Pt states she is supposed to wear a c-pap machine but does not have access to one anymore. Pt currently has no increased work of breathing noted at this time.

## 2016-07-04 NOTE — ED Provider Notes (Signed)
[facility] Emergency Department Provider Note  Time seen: 12:05 AM  I have reviewed the triage vital signs and the nursing notes.   HISTORY  Chief Complaint No chief complaint on file.    HPI Marie Clarke is a 46 y.o. female with a past medical history of anxiety, anemia, bipolar, COPD, CHF, diabetes, hypertension, hyperlipidemia, MI, CVA, presents to the emergency department for shortness of breath and increased edema. According to the patient the past for 5 days she has noticed significant increase in peripheral edema. She states over the same time. She is also noted some shortness of breath and chest discomfort which she describes as tightness in the left chest. Denies any nausea, diaphoresis. Denies any fever, cough or congestion. Otherwise largely negative review of systems.  Past Medical History:  Diagnosis Date  . Anemia   . Anxiety   . Asthma   . Bipolar disorder ([place])   . CAD (coronary artery disease) unk  . CHF (congestive heart failure) ([place])   . COPD (chronic obstructive pulmonary disease) ([place])   . Depression unk  . Diabetes mellitus without complication ([place])   . Diabetes mellitus, type II ([place])   . Drug overdose   . GERD (gastroesophageal reflux disease)   . Headache   . Hyperlipidemia   . Hypertension   . Left leg pain 04/29/2014  . MI (myocardial infarction) ([place])   . Muscle ache 09/16/2014  . Osteoporosis   . Overactive bladder   . Pancreatitis unk  . Reflex sympathetic dystrophy   . Renal insufficiency   . Sleep apnea    pt reported on 2/6/7 she currently is not using CPAP  . Sleep apnea   . Stroke ([place])   . Thyroid disease   . TIA (transient ischemic attack) unk  . TIA (transient ischemic attack)     Patient Active Problem List   Diagnosis Date Noted  . Contusion of knee, left 10/12/2015  . Sprain of left ankle 10/12/2015  . Strain of left knee 10/12/2015  . Ankle instability 07/20/2015  . Incidental lung nodule 04/28/2015   . Chronic pain 03/28/2015  . Chronic low back pain (Location of Primary Source of Pain) (Bilateral) (L>R) 03/28/2015  . Chronic lower extremity pain (Left) 03/28/2015  . Abdominal wound dehiscence 03/28/2015  . Encounter for pain management planning 03/28/2015  . Morbid obesity ([place]) 03/28/2015  . Abnormal CT scan, lumbar spine 03/28/2015  . Lumbar facet hypertrophy 03/28/2015  . Lumbar facet syndrome (Location of Primary Source of Pain) (Bilateral) (L>R) 03/28/2015  . Lumbar foraminal stenosis (Bilateral) (L5-S1) 03/28/2015  . Chronic ankle pain (Left) 03/28/2015  . CRPS (complex regional pain syndrome) type I of lower limb (left ankle) 03/28/2015  . Neurogenic pain 03/28/2015  . Neuropathic pain 03/28/2015  . Myofascial pain 03/28/2015  . History of suicide attempt 03/28/2015  . 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 Abnormal gait 12/15/2014  . Congestive heart failure ([place]) 11/15/2014  . Abdominal wall abscess 09/20/2014  . 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 Diabetes mellitus, type 2 ([place]) 08/13/2014  . Bipolar affective disorder ([place]) 08/13/2014  . Type 2 diabetes mellitus ([place]) 08/13/2014  . Rectal prolapse 08/09/2014  . Rectal bleeding 08/09/2014  . Rectal bleed 08/09/2014  . Affective bipolar disorder ([place]) 08/05/2014  . Arteriosclerosis of coronary artery 08/05/2014  . CCF (congestive cardiac failure) ([place]) 08/05/2014  . Chronic kidney disease 08/05/2014  . 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 Apnea, sleep 08/05/2014  . Temporary cerebral vascular dysfunction 08/05/2014  .  Polypharmacy 04/29/2014  . Other long term (current) drug therapy 04/29/2014  . 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 Chronic kidney disease, stage III (moderate) 12/14/2013  . Controlled diabetes mellitus type II without complication ([place]) 72/53/6644  . Essential (primary) hypertension 12/03/2013  . Adult hypothyroidism 12/03/2013  . Controlled type 2 diabetes mellitus without complication  ([place]) 03/47/4259    Past Surgical History:  Procedure Laterality Date  . ABDOMINAL HYSTERECTOMY    . prolapse rectum surgery N/A July 2016  . TONSILLECTOMY      Prior to Admission medications   Medication Sig Start Date End Date Taking? Authorizing Provider  albuterol (PROVENTIL HFA;VENTOLIN HFA) 108 (90 BASE) MCG/ACT inhaler Inhale 2 puffs into the lungs every 4 (four) hours as needed for wheezing or shortness of breath.    [provider]  aspirin EC 81 MG tablet Take 81 mg by mouth daily at 12 noon.    [provider]  atorvastatin (LIPITOR) 40 MG tablet Take 40 mg by mouth at bedtime.     [provider]  atorvastatin (LIPITOR) 40 MG tablet Take by mouth. 09/27/15 08/04/16  [provider]  azelastine (ASTELIN) 0.1 % nasal spray Place 1 spray into both nostrils daily.    [provider]  busPIRone (BUSPAR) 15 MG tablet Take 1 tablet (15 mg total) by mouth 3 (three) times daily. 05/09/16   Rainey Pines, MD  chlorpheniramine-HYDROcodone (TUSSIONEX PENNKINETIC ER) 10-8 MG/5ML SUER Take 5 mLs by mouth 2 (two) times daily. 02/25/16   Earleen Newport, MD  cyclobenzaprine (FLEXERIL) 5 MG tablet Take 1 tablet (5 mg total) by mouth every 8 (eight) hours as needed for muscle spasms. 08/02/15   Hagler, Jami L, PA-C  cyclobenzaprine (FLEXERIL) 5 MG tablet Take by mouth. 09/27/15 08/04/16  [provider]  escitalopram (LEXAPRO) 10 MG tablet 1/2 pill daily x 2 weeks then 1 pill daily 05/09/16   Rainey Pines, MD  fluticasone-salmeterol (ADVAIR HFA) 230-21 MCG/ACT inhaler Inhale 2 puffs into the lungs 2 (two) times daily.    [provider]  fluticasone-salmeterol (ADVAIR HFA) 230-21 MCG/ACT inhaler Inhale into the lungs. 04/28/15 04/27/16  [provider]  furosemide (LASIX) 40 MG tablet Take by mouth. 09/27/15 08/04/16  [provider]  gabapentin (NEURONTIN) 600 MG tablet Take by mouth. 04/19/16   [provider]   glipiZIDE (GLUCOTROL XL) 5 MG 24 hr tablet Take by mouth. 09/27/15 08/04/16  [provider]  glipiZIDE (GLUCOTROL XL) 5 MG 24 hr tablet Take by mouth. 03/20/16 01/26/17  [provider]  KLOR-CON M20 20 MEQ tablet  10/13/15   [provider]  lamoTRIgine (LAMICTAL) 200 MG tablet Take 1 tablet (200 mg total) by mouth daily. 05/09/16   Rainey Pines, MD  levocetirizine (XYZAL) 5 MG tablet Take 5 mg by mouth at bedtime.     [provider]  levocetirizine Harlow Ohms) 5 MG tablet Take by mouth. 10/04/15 10/03/16  [provider]  levothyroxine (SYNTHROID, LEVOTHROID) 200 MCG tablet Take 200 mcg by mouth daily.     [provider]  meloxicam (MOBIC) 15 MG tablet  09/28/15   [provider]  metoprolol succinate (TOPROL-XL) 25 MG 24 hr tablet Take by mouth. 09/27/15 08/04/16  [provider]  montelukast (SINGULAIR) 10 MG tablet Take 10 mg by mouth daily.     [provider]  Multiple Vitamin (MULTIVITAMIN WITH MINERALS) TABS tablet Take 1 tablet by mouth daily.    [provider]  naproxen (NAPROSYN)  500 MG tablet Take 1 tablet (500 mg total) by mouth 2 (two) times daily with a meal. 08/02/15   Hagler, Jami L, PA-C  Olopatadine HCl (PATADAY) 0.2 % SOLN Apply 1 drop to eye as needed (for dry eyes).    [provider]  ondansetron (ZOFRAN ODT) 4 MG disintegrating tablet Take 1 tablet (4 mg total) by mouth every 8 (eight) hours as needed for nausea or vomiting. 02/25/16   Earleen Newport, MD  oxyCODONE-acetaminophen (PERCOCET) 5-325 MG tablet Take 1 tablet by mouth every 4 (four) hours as needed for severe pain. 07/19/15   [name], PA-C  promethazine (PHENERGAN) 25 MG tablet Take 25 mg by mouth. 03/16/15   [provider]  promethazine (PHENERGAN) 25 MG tablet Take by mouth. 09/27/15 08/04/16  [provider]  ranitidine (ZANTAC) 300 MG tablet Take 1 tablet (300 mg total) by mouth 2 (two) times  daily. Patient taking differently: Take 300 mg by mouth daily.  11/30/14   Carrie Mew, MD  ranolazine (RANEXA) 500 MG 12 hr tablet Take 500 mg by mouth 2 (two) times daily.    [provider]  tiotropium (SPIRIVA) 18 MCG inhalation capsule Place 18 mcg into inhaler and inhale at bedtime.    [provider]  topiramate (TOPAMAX) 100 MG tablet Take 1 tablet (100 mg total) by mouth 2 (two) times daily. 05/09/16   Rainey Pines, MD  torsemide (DEMADEX) 20 MG tablet Take 20 mg by mouth daily.    [provider]    Allergies  Allergen Reactions  . Diazepam Hives and Nausea And Vomiting  . Sulfa Antibiotics Hives  . Cephalexin Hives  . Ciprofloxacin Hives  . Flagyl [Metronidazole] Hives  . Levofloxacin Hives  . Vancomycin Hives  . Penicillins Nausea And Vomiting  . Pregabalin Hives and Rash    Family History  Problem Relation Age of Onset  . Diabetes Mellitus II Mother   . CAD Mother   . Sleep apnea Mother   . Osteoarthritis Mother   . Osteoporosis Mother   . Anxiety disorder Mother   . Depression Mother   . Bipolar disorder Mother   . Bipolar disorder Father   . Hypertension Father   . Depression Father   . Anxiety disorder Father   . Post-traumatic stress disorder Sister     Social History Social History  Substance Use Topics  . Smoking status: Former Smoker    Packs/day: 1.00    Types: Cigarettes    Quit date: 06/12/2016  . Smokeless tobacco: Never Used  . Alcohol use No    Review of Systems Constitutional: Negative for fever. Eyes: Negative for visual changes. ENT: Negative for congestion Cardiovascular: Mild left-sided chest discomfort which she describes as a tightness sensation for the past for 5 days Respiratory: Positive shortness of breath Gastrointestinal: Negative for abdominal pain Musculoskeletal: States hand and foot swelling Skin: Negative for rash. Neurological: Negative for headache All other ROS  negative  ____________________________________________   PHYSICAL EXAM:  Constitutional: Alert and oriented. Well appearing and in no distress. Eyes: Normal exam ENT   Head: Normocephalic and atraumatic.   Mouth/Throat: Mucous membranes are moist. Cardiovascular: Normal rate, regular rhythm. No murmur Respiratory: Normal respiratory effort without tachypnea nor retractions. Breath sounds are clear. No wheezes rales or rhonchi on exam. Gastrointestinal: Soft and nontender. No distention.   Musculoskeletal: Nontender with normal range of motion in all extremities. Mild to moderate lower extremity edema, equal bilaterally but does not appear  to be pitting. Neurologic:  Normal speech and language. No gross focal neurologic deficits  Skin:  Skin is warm, dry and intact.  Psychiatric: Mood and affect are normal.  ____________________________________________    EKG  EKG reviewed and interpreted by myself shows normal sinus rhythm at 87 bpm, narrow QRS, normal axis, normal intervals, no concerning ST changes.  ____________________________________________    RADIOLOGY  Chest x-ray is most consistent with bronchitis.  ____________________________________________   INITIAL IMPRESSION / ASSESSMENT AND PLAN / ED COURSE  Pertinent labs & imaging results that were available during my care of the patient were reviewed by me and considered in my medical decision making (see chart for details).  Patient presents to the emergency department for shortness of breath and increased peripheral edema over the past for 5 days. Overall the patient appears well exam, no acute distress. Patient has clear breath sounds. We will check labs, chest x-ray, and closely monitor. We will dose IV Lasix in the emergency department while awaiting results.  Patient's workup is largely within normal limits. Troponin negative. BNP normal. Chest x-ray shows chronic bronchitis and no edema or consolidation.  Overall the patient appears well, normal vitals 99% room air saturation. I discussed results with the patient and the need to follow up with her primary care doctor. Patient is agreeable to plan.  ____________________________________________   FINAL CLINICAL IMPRESSION(S) / ED DIAGNOSES  Shortness of breath Peripheral edema    Harvest Dark, MD 07/04/16 0145

## 2016-07-12 ENCOUNTER — Ambulatory Visit: Payer: Medicare HMO

## 2016-07-19 ENCOUNTER — Ambulatory Visit
Admission: RE | Admit: 2016-07-19 | Discharge: 2016-07-19 | Disposition: A | Discharge status: Home / Self Care | Payer: Medicare HMO | Source: Ambulatory Visit | Attending: Family Medicine | Admitting: Family Medicine

## 2016-07-19 DIAGNOSIS — E041 Nontoxic single thyroid nodule: Secondary | ICD-10-CM | POA: Diagnosis present

## 2016-07-27 ENCOUNTER — Ambulatory Visit: Payer: Medicare HMO | Admitting: *Deleted

## 2016-08-12 ENCOUNTER — Emergency Department
Admission: EM | Admit: 2016-08-12 | Discharge: 2016-08-12 | Disposition: A | Discharge status: Home / Self Care | Payer: Medicare HMO | Attending: Emergency Medicine | Admitting: Emergency Medicine

## 2016-08-12 ENCOUNTER — Encounter: Payer: Self-pay | Admitting: Emergency Medicine

## 2016-08-12 ENCOUNTER — Emergency Department: Payer: Medicare HMO

## 2016-08-12 DIAGNOSIS — Y939 Activity, unspecified: Secondary | ICD-10-CM | POA: Diagnosis not present

## 2016-08-12 DIAGNOSIS — Z87891 Personal history of nicotine dependence: Secondary | ICD-10-CM | POA: Insufficient documentation

## 2016-08-12 DIAGNOSIS — W1849XA Other slipping, tripping and stumbling without falling, initial encounter: Secondary | ICD-10-CM | POA: Diagnosis not present

## 2016-08-12 DIAGNOSIS — N183 Chronic kidney disease, stage 3 (moderate): Secondary | ICD-10-CM | POA: Insufficient documentation

## 2016-08-12 DIAGNOSIS — J45909 Unspecified asthma, uncomplicated: Secondary | ICD-10-CM | POA: Insufficient documentation

## 2016-08-12 DIAGNOSIS — E1122 Type 2 diabetes mellitus with diabetic chronic kidney disease: Secondary | ICD-10-CM | POA: Diagnosis not present

## 2016-08-12 DIAGNOSIS — Y929 Unspecified place or not applicable: Secondary | ICD-10-CM | POA: Diagnosis not present

## 2016-08-12 DIAGNOSIS — Y998 Other external cause status: Secondary | ICD-10-CM | POA: Insufficient documentation

## 2016-08-12 DIAGNOSIS — J449 Chronic obstructive pulmonary disease, unspecified: Secondary | ICD-10-CM | POA: Insufficient documentation

## 2016-08-12 DIAGNOSIS — I509 Heart failure, unspecified: Secondary | ICD-10-CM | POA: Diagnosis not present

## 2016-08-12 DIAGNOSIS — S93492A Sprain of other ligament of left ankle, initial encounter: Secondary | ICD-10-CM

## 2016-08-12 DIAGNOSIS — I252 Old myocardial infarction: Secondary | ICD-10-CM | POA: Diagnosis not present

## 2016-08-12 DIAGNOSIS — I129 Hypertensive chronic kidney disease with stage 1 through stage 4 chronic kidney disease, or unspecified chronic kidney disease: Secondary | ICD-10-CM | POA: Diagnosis not present

## 2016-08-12 DIAGNOSIS — S99912A Unspecified injury of left ankle, initial encounter: Secondary | ICD-10-CM | POA: Diagnosis present

## 2016-08-12 MED ORDER — TRAMADOL HCL 50 MG PO TABS: 50.0000 mg | ORAL_TABLET | Freq: Four times a day (QID) | ORAL | 0 refills | Status: DC | PRN

## 2016-08-12 NOTE — ED Notes (Signed)
Pt reports having problems with left ankle giving out, states it gave out 3 days ago and states the pain is gradually worsening.  Pt states it is painful to bear weight on left ankle. Swelling noted.

## 2016-08-12 NOTE — Discharge Instructions (Signed)
Follow-up with the podiatrist for symptoms that are not improving over the week. Return to the emergency department for symptoms that change or worsen if you're unable to schedule an appointment with her primary care provider or the specialist.

## 2016-08-12 NOTE — ED Provider Notes (Signed)
[facility] Emergency Department Provider Note ____________________________________________  Time seen: Approximately 5:19 PM  I have reviewed the triage vital signs and the nursing notes.   HISTORY  Chief Complaint Ankle Pain    HPI Marie Clarke is a 46 y.o. female who presents to the emergency department for evaluation of left ankle pain. She states that she twisted the ankle approximately 3 days ago and the pain is gradually worsening. Pain increases with attempts to bear full weight. She has taken naproxen without relief.  Past Medical History:  Diagnosis Date  . Anemia   . Anxiety   . Asthma   . Bipolar disorder ([place])   . CAD (coronary artery disease) unk  . CHF (congestive heart failure) ([place])   . COPD (chronic obstructive pulmonary disease) ([place])   . Depression unk  . Diabetes mellitus without complication ([place])   . Diabetes mellitus, type II ([place])   . Drug overdose   . GERD (gastroesophageal reflux disease)   . Headache   . Hyperlipidemia   . Hypertension   . Left leg pain 04/29/2014  . MI (myocardial infarction) ([place])   . Muscle ache 09/16/2014  . Osteoporosis   . Overactive bladder   . Pancreatitis unk  . Reflex sympathetic dystrophy   . Renal insufficiency   . Sleep apnea    pt reported on 2/6/7 she currently is not using CPAP  . Sleep apnea   . Stroke ([place])   . Thyroid disease   . TIA (transient ischemic attack) unk  . TIA (transient ischemic attack)     Patient Active Problem List   Diagnosis Date Noted  . Contusion of knee, left 10/12/2015  . Sprain of left ankle 10/12/2015  . Strain of left knee 10/12/2015  . Ankle instability 07/20/2015  . Incidental lung nodule 04/28/2015  . Chronic pain 03/28/2015  . Chronic low back pain (Location of Primary Source of Pain) (Bilateral) (L>R) 03/28/2015  . Chronic lower extremity pain (Left) 03/28/2015  . Abdominal wound dehiscence 03/28/2015  . Encounter for pain management planning  03/28/2015  . Morbid obesity ([place]) 03/28/2015  . Abnormal CT scan, lumbar spine 03/28/2015  . Lumbar facet hypertrophy 03/28/2015  . Lumbar facet syndrome (Location of Primary Source of Pain) (Bilateral) (L>R) 03/28/2015  . Lumbar foraminal stenosis (Bilateral) (L5-S1) 03/28/2015  . Chronic ankle pain (Left) 03/28/2015  . CRPS (complex regional pain syndrome) type I of lower limb (left ankle) 03/28/2015  . Neurogenic pain 03/28/2015  . Neuropathic pain 03/28/2015  . Myofascial pain 03/28/2015  . History of suicide attempt 03/28/2015  . 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 Abnormal gait 12/15/2014  . Congestive heart failure ([place]) 11/15/2014  . Abdominal wall abscess 09/20/2014  . 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 Diabetes mellitus, type 2 ([place]) 08/13/2014  . Bipolar affective disorder ([place]) 08/13/2014  . Type 2 diabetes mellitus ([place]) 08/13/2014  . Rectal prolapse 08/09/2014  . Rectal bleeding 08/09/2014  . Rectal bleed 08/09/2014  . Affective bipolar disorder ([place]) 08/05/2014  . Arteriosclerosis of coronary artery 08/05/2014  . CCF (congestive cardiac failure) ([place]) 08/05/2014  . Chronic kidney disease 08/05/2014  . 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 Apnea, sleep 08/05/2014  . Temporary cerebral vascular dysfunction 08/05/2014  . Polypharmacy 04/29/2014  . Other long term (current) drug therapy 04/29/2014  . 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 Chronic kidney disease, stage III (moderate) 12/14/2013  . Controlled diabetes mellitus type II without complication ([place]) 28/31/5176  . Essential (primary) hypertension 12/03/2013  .  Adult hypothyroidism 12/03/2013  . Controlled type 2 diabetes mellitus without complication ([place]) 88/41/6606    Past Surgical History:  Procedure Laterality Date  . ABDOMINAL HYSTERECTOMY    . prolapse rectum surgery N/A July 2016  . TONSILLECTOMY      Prior to Admission medications   Medication Sig Start Date End Date Taking? Authorizing  Provider  albuterol (PROVENTIL HFA;VENTOLIN HFA) 108 (90 BASE) MCG/ACT inhaler Inhale 2 puffs into the lungs every 4 (four) hours as needed for wheezing or shortness of breath.    [provider]  aspirin EC 81 MG tablet Take 81 mg by mouth daily at 12 noon.    [provider]  atorvastatin (LIPITOR) 40 MG tablet Take 40 mg by mouth at bedtime.     [provider]  atorvastatin (LIPITOR) 40 MG tablet Take by mouth. 09/27/15 08/04/16  [provider]  azelastine (ASTELIN) 0.1 % nasal spray Place 1 spray into both nostrils daily.    [provider]  busPIRone (BUSPAR) 15 MG tablet Take 1 tablet (15 mg total) by mouth 3 (three) times daily. 07/04/16   Rainey Pines, MD  chlorpheniramine-HYDROcodone (TUSSIONEX PENNKINETIC ER) 10-8 MG/5ML SUER Take 5 mLs by mouth 2 (two) times daily. 02/25/16   Earleen Newport, MD  cyclobenzaprine (FLEXERIL) 5 MG tablet Take 1 tablet (5 mg total) by mouth every 8 (eight) hours as needed for muscle spasms. 08/02/15   Hagler, Jami L, PA-C  fluticasone-salmeterol (ADVAIR HFA) 230-21 MCG/ACT inhaler Inhale 2 puffs into the lungs 2 (two) times daily.    [provider]  fluticasone-salmeterol (ADVAIR HFA) 230-21 MCG/ACT inhaler Inhale into the lungs. 04/28/15 04/27/16  [provider]  furosemide (LASIX) 40 MG tablet Take by mouth. 09/27/15 08/04/16  [provider]  gabapentin (NEURONTIN) 600 MG tablet Take by mouth. 04/19/16   [provider]  glipiZIDE (GLUCOTROL XL) 5 MG 24 hr tablet Take by mouth. 09/27/15 08/04/16  [provider]  glipiZIDE (GLUCOTROL XL) 5 MG 24 hr tablet Take by mouth. 03/20/16 01/26/17  [provider]  KLOR-CON M20 20 MEQ tablet  10/13/15   [provider]  lamoTRIgine (LAMICTAL) 200 MG tablet Take 1 tablet (200 mg total) by mouth daily. 07/04/16   Rainey Pines, MD  levocetirizine (XYZAL) 5 MG tablet Take 5 mg by mouth at bedtime.     [provider]  levocetirizine Harlow Ohms) 5 MG tablet Take by mouth. 10/04/15 10/03/16  [provider]  levothyroxine (SYNTHROID, LEVOTHROID) 200 MCG tablet Take 200 mcg by mouth daily.     [provider]  meloxicam (MOBIC) 15 MG tablet  09/28/15   [provider]  montelukast (SINGULAIR) 10 MG tablet Take 10 mg by mouth daily.     [provider]  Multiple Vitamin (MULTIVITAMIN WITH MINERALS) TABS tablet Take 1 tablet by mouth daily.    [provider]  naproxen (NAPROSYN) 500 MG tablet Take 1 tablet (500 mg total) by mouth 2 (two) times daily with a meal. 08/02/15   Hagler, Jami L, PA-C  Olopatadine HCl (PATADAY) 0.2 % SOLN Apply 1 drop to eye as needed (for dry eyes).    [provider]  ondansetron (ZOFRAN ODT) 4 MG disintegrating tablet Take 1 tablet (4 mg total) by mouth every 8 (eight) hours as needed for nausea or vomiting. 02/25/16   Earleen Newport, MD  oxyCODONE-acetaminophen (PERCOCET) 5-325 MG tablet Take 1 tablet by mouth every 4 (four) hours as needed for  severe pain. 07/19/15   [name], PA-C  promethazine (PHENERGAN) 25 MG tablet Take 25 mg by mouth. 03/16/15   [provider]  ranitidine (ZANTAC) 300 MG tablet Take 1 tablet (300 mg total) by mouth 2 (two) times daily. Patient taking differently: Take 300 mg by mouth daily.  11/30/14   Carrie Mew, MD  ranolazine (RANEXA) 500 MG 12 hr tablet Take 500 mg by mouth 2 (two) times daily.    [provider]  tiotropium (SPIRIVA) 18 MCG inhalation capsule Place 18 mcg into inhaler and inhale at bedtime.    [provider]  topiramate (TOPAMAX) 100 MG tablet Take 1 tablet (100 mg total) by mouth 2 (two) times daily. 07/04/16   Rainey Pines, MD  torsemide (DEMADEX) 20 MG tablet Take 20 mg by mouth daily.    [provider]  traMADol (ULTRAM) 50 MG tablet Take 1 tablet (50 mg total) by mouth every 6 (six) hours as needed. 08/12/16   Librado Guandique, Johnette Abraham  B, FNP    Allergies Diazepam; Sulfa antibiotics; Cephalexin; Ciprofloxacin; Flagyl [metronidazole]; Levofloxacin; Vancomycin; Penicillins; and Pregabalin  Family History  Problem Relation Age of Onset  . Diabetes Mellitus II Mother   . CAD Mother   . Sleep apnea Mother   . Osteoarthritis Mother   . Osteoporosis Mother   . Anxiety disorder Mother   . Depression Mother   . Bipolar disorder Mother   . Bipolar disorder Father   . Hypertension Father   . Depression Father   . Anxiety disorder Father   . Post-traumatic stress disorder Sister     Social History Social History  Substance Use Topics  . Smoking status: Former Smoker    Packs/day: 1.00    Types: Cigarettes    Quit date: 06/12/2016  . Smokeless tobacco: Never Used  . Alcohol use No    Review of Systems Constitutional: Negative for recent illness. Cardiovascular: Negative for chest pain. Negative for active bleeding. Respiratory: Negative for cough or shortness of breath. Musculoskeletal: Positive for left ankle pain and swelling. Skin: Positive for swelling of the left ankle. Negative for lesion or wound.  Neurological: Negative for paresthesias or radicular symptoms.  ____________________________________________   PHYSICAL EXAM:  VITAL SIGNS: ED Triage Vitals [08/12/16 1706]  Enc Vitals Group     BP (!) 105/54     Pulse Rate 80     Resp      Temp 98.4 F (36.9 C)     Temp Source Oral     SpO2 95 %     Weight 217 lb (98.4 kg)     Height 5\' 4"  (1.626 m)     Head Circumference      Peak Flow      Pain Score 10     Pain Loc      Pain Edu?      Excl. in [place]?     Constitutional: Alert and oriented. Well appearing and in no acute distress. Eyes: Conjunctivae are normal without erythema or drainage.  Head: Atraumatic. Neck: Active, full range of motion throughout Respiratory: Respirations are even and unlabored. Musculoskeletal: Left ankle tenderness demonstrated over the ATFL pattern. No tenderness  over the proximal tibia or fibula. Neurologic: Normal motor and sensation throughout.  Skin: Mild swelling over the left ankle without ecchymosis or erythema.  Psychiatric: Behavior appropriate.  ____________________________________________   LABS (all labs ordered are listed, but only abnormal results are displayed)  Labs Reviewed - No data to display ____________________________________________  RADIOLOGY  Left ankle negative for acute bony abnormality per radiology. ____________________________________________   PROCEDURES  Procedure(s) performed: Ankle stirrup splint applied to the left ankle by ER tech. Patient neurovascularly intact post-application.  ____________________________________________   INITIAL IMPRESSION / ASSESSMENT AND PLAN / ED COURSE  Marie Clarke is a 46 y.o. female who presents to the emergency department for evaluation of left ankle pain after a twisting injury 3 days ago. X-ray and exam consistent with sprain. She will was placed in an ankle stirrup splint and given a prescription for tramadol. She was advised to follow-up with the podiatrist for symptoms that are not improving over the week. She was advised to rest, ice, and elevate the extremity. She was encouraged to return to the emergency department for symptoms that change or worsen if she is unable schedule an appointment with her primary care provider or the specialist.  Pertinent labs & imaging results that were available during my care of the patient were reviewed by me and considered in my medical decision making (see chart for details).  _________________________________________   FINAL CLINICAL IMPRESSION(S) / ED DIAGNOSES  Final diagnoses:  Sprain of anterior talofibular ligament of left ankle, initial encounter    New Prescriptions   TRAMADOL (ULTRAM) 50 MG TABLET    Take 1 tablet (50 mg total) by mouth every 6 (six) hours as needed.    If controlled substance prescribed during this  visit, 12 month history viewed on the [facility] prior to issuing an initial prescription for Schedule II or III opiod.    Victorino Dike, FNP 08/12/16 1801    Earleen Newport, MD 08/12/16 [phone]

## 2016-08-12 NOTE — ED Triage Notes (Signed)
C/O twisting left ankle 3-4 days ago.  C/O left ankle pain and swelling.

## 2016-09-11 ENCOUNTER — Other Ambulatory Visit: Payer: Self-pay | Admitting: Family Medicine

## 2016-09-11 ENCOUNTER — Ambulatory Visit
Admission: RE | Admit: 2016-09-11 | Discharge: 2016-09-11 | Disposition: A | Discharge status: Home / Self Care | Payer: Medicare HMO | Source: Ambulatory Visit | Attending: Family Medicine | Admitting: Family Medicine

## 2016-09-11 DIAGNOSIS — R1031 Right lower quadrant pain: Secondary | ICD-10-CM | POA: Diagnosis present

## 2016-09-11 DIAGNOSIS — K436 Other and unspecified ventral hernia with obstruction, without gangrene: Secondary | ICD-10-CM | POA: Diagnosis not present

## 2016-09-11 DIAGNOSIS — K5792 Diverticulitis of intestine, part unspecified, without perforation or abscess without bleeding: Secondary | ICD-10-CM

## 2016-09-11 DIAGNOSIS — R1084 Generalized abdominal pain: Secondary | ICD-10-CM

## 2016-09-11 LAB — POCT I-STAT CREATININE: CREATININE: 0.9 mg/dL (ref 0.44–1.00)

## 2016-09-11 MED ORDER — IOPAMIDOL (ISOVUE-300) INJECTION 61%
125.0000 mL | Freq: Once | INTRAVENOUS | Status: AC | PRN
  Administered 2016-09-11: 100 mL via INTRAVENOUS

## 2016-09-17 ENCOUNTER — Emergency Department: Payer: Medicare Other

## 2016-09-17 ENCOUNTER — Observation Stay
Admission: EM | Admit: 2016-09-17 | Discharge: 2016-09-18 | Disposition: A | Discharge status: Home / Self Care | Payer: Medicare Other | Attending: Internal Medicine | Admitting: Internal Medicine

## 2016-09-17 ENCOUNTER — Encounter: Payer: Self-pay | Admitting: Emergency Medicine

## 2016-09-17 DIAGNOSIS — F3160 Bipolar disorder, current episode mixed, unspecified: Secondary | ICD-10-CM | POA: Diagnosis not present

## 2016-09-17 DIAGNOSIS — Z79899 Other long term (current) drug therapy: Secondary | ICD-10-CM | POA: Diagnosis not present

## 2016-09-17 DIAGNOSIS — E785 Hyperlipidemia, unspecified: Secondary | ICD-10-CM | POA: Diagnosis not present

## 2016-09-17 DIAGNOSIS — Z7982 Long term (current) use of aspirin: Secondary | ICD-10-CM | POA: Diagnosis not present

## 2016-09-17 DIAGNOSIS — J449 Chronic obstructive pulmonary disease, unspecified: Secondary | ICD-10-CM | POA: Insufficient documentation

## 2016-09-17 DIAGNOSIS — R197 Diarrhea, unspecified: Secondary | ICD-10-CM

## 2016-09-17 DIAGNOSIS — K219 Gastro-esophageal reflux disease without esophagitis: Secondary | ICD-10-CM | POA: Insufficient documentation

## 2016-09-17 DIAGNOSIS — N3281 Overactive bladder: Secondary | ICD-10-CM | POA: Insufficient documentation

## 2016-09-17 DIAGNOSIS — I2510 Atherosclerotic heart disease of native coronary artery without angina pectoris: Secondary | ICD-10-CM | POA: Insufficient documentation

## 2016-09-17 DIAGNOSIS — Z881 Allergy status to other antibiotic agents status: Secondary | ICD-10-CM | POA: Diagnosis not present

## 2016-09-17 DIAGNOSIS — Z880 Allergy status to penicillin: Secondary | ICD-10-CM | POA: Insufficient documentation

## 2016-09-17 DIAGNOSIS — I509 Heart failure, unspecified: Secondary | ICD-10-CM | POA: Diagnosis not present

## 2016-09-17 DIAGNOSIS — K529 Noninfective gastroenteritis and colitis, unspecified: Secondary | ICD-10-CM | POA: Diagnosis not present

## 2016-09-17 DIAGNOSIS — I110 Hypertensive heart disease with heart failure: Secondary | ICD-10-CM | POA: Diagnosis not present

## 2016-09-17 DIAGNOSIS — F17210 Nicotine dependence, cigarettes, uncomplicated: Secondary | ICD-10-CM | POA: Insufficient documentation

## 2016-09-17 DIAGNOSIS — Z7984 Long term (current) use of oral hypoglycemic drugs: Secondary | ICD-10-CM | POA: Diagnosis not present

## 2016-09-17 DIAGNOSIS — G4730 Sleep apnea, unspecified: Secondary | ICD-10-CM | POA: Insufficient documentation

## 2016-09-17 DIAGNOSIS — F419 Anxiety disorder, unspecified: Secondary | ICD-10-CM | POA: Diagnosis not present

## 2016-09-17 DIAGNOSIS — K623 Rectal prolapse: Secondary | ICD-10-CM | POA: Diagnosis not present

## 2016-09-17 DIAGNOSIS — I959 Hypotension, unspecified: Secondary | ICD-10-CM | POA: Diagnosis not present

## 2016-09-17 DIAGNOSIS — Z8673 Personal history of transient ischemic attack (TIA), and cerebral infarction without residual deficits: Secondary | ICD-10-CM | POA: Insufficient documentation

## 2016-09-17 DIAGNOSIS — E039 Hypothyroidism, unspecified: Secondary | ICD-10-CM | POA: Insufficient documentation

## 2016-09-17 DIAGNOSIS — I130 Hypertensive heart and chronic kidney disease with heart failure and stage 1 through stage 4 chronic kidney disease, or unspecified chronic kidney disease: Secondary | ICD-10-CM | POA: Insufficient documentation

## 2016-09-17 DIAGNOSIS — E1122 Type 2 diabetes mellitus with diabetic chronic kidney disease: Secondary | ICD-10-CM | POA: Diagnosis not present

## 2016-09-17 LAB — URINALYSIS, COMPLETE (UACMP) WITH MICROSCOPIC
BACTERIA UA: NONE SEEN
Bilirubin Urine: NEGATIVE
Glucose, UA: NEGATIVE mg/dL
Hgb urine dipstick: NEGATIVE
KETONES UR: NEGATIVE mg/dL
LEUKOCYTES UA: NEGATIVE
Nitrite: NEGATIVE
PROTEIN: NEGATIVE mg/dL
SQUAMOUS EPITHELIAL / LPF: NONE SEEN
Specific Gravity, Urine: 1.011 (ref 1.005–1.030)
pH: 7 (ref 5.0–8.0)

## 2016-09-17 LAB — CBC WITH DIFFERENTIAL/PLATELET
BASOS PCT: 0 %
Basophils Absolute: 0 10*3/uL (ref 0–0.1)
EOS ABS: 0.1 10*3/uL (ref 0–0.7)
EOS PCT: 1 %
HCT: 33.6 % — ABNORMAL LOW (ref 35.0–47.0)
Hemoglobin: 11.2 g/dL — ABNORMAL LOW (ref 12.0–16.0)
LYMPHS ABS: 1.9 10*3/uL (ref 1.0–3.6)
Lymphocytes Relative: 26 %
MCH: 31.7 pg (ref 26.0–34.0)
MCHC: 33.4 g/dL (ref 32.0–36.0)
MCV: 94.9 fL (ref 80.0–100.0)
MONOS PCT: 6 %
Monocytes Absolute: 0.4 10*3/uL (ref 0.2–0.9)
Neutro Abs: 4.9 10*3/uL (ref 1.4–6.5)
Neutrophils Relative %: 67 %
PLATELETS: 209 10*3/uL (ref 150–440)
RBC: 3.54 MIL/uL — ABNORMAL LOW (ref 3.80–5.20)
RDW: 15.4 % — ABNORMAL HIGH (ref 11.5–14.5)
WBC: 7.4 10*3/uL (ref 3.6–11.0)

## 2016-09-17 LAB — COMPREHENSIVE METABOLIC PANEL
ALK PHOS: 83 U/L (ref 38–126)
ALT: 10 U/L — AB (ref 14–54)
AST: 14 U/L — AB (ref 15–41)
Albumin: 3.7 g/dL (ref 3.5–5.0)
Anion gap: 8 (ref 5–15)
BUN: 16 mg/dL (ref 6–20)
CALCIUM: 9.1 mg/dL (ref 8.9–10.3)
CHLORIDE: 108 mmol/L (ref 101–111)
CO2: 25 mmol/L (ref 22–32)
CREATININE: 0.91 mg/dL (ref 0.44–1.00)
Glucose, Bld: 95 mg/dL (ref 65–99)
Potassium: 3.7 mmol/L (ref 3.5–5.1)
SODIUM: 141 mmol/L (ref 135–145)
Total Bilirubin: 0.6 mg/dL (ref 0.3–1.2)
Total Protein: 6.7 g/dL (ref 6.5–8.1)

## 2016-09-17 LAB — URINE DRUG SCREEN, QUALITATIVE (ARMC ONLY)
AMPHETAMINES, UR SCREEN: NOT DETECTED
BARBITURATES, UR SCREEN: NOT DETECTED
Benzodiazepine, Ur Scrn: NOT DETECTED
COCAINE METABOLITE, UR ~~LOC~~: NOT DETECTED
Cannabinoid 50 Ng, Ur ~~LOC~~: NOT DETECTED
MDMA (Ecstasy)Ur Screen: NOT DETECTED
METHADONE SCREEN, URINE: NOT DETECTED
OPIATE, UR SCREEN: NOT DETECTED
PHENCYCLIDINE (PCP) UR S: NOT DETECTED
Tricyclic, Ur Screen: NOT DETECTED

## 2016-09-17 LAB — GASTROINTESTINAL PANEL BY PCR, STOOL (REPLACES STOOL CULTURE)

## 2016-09-17 LAB — LIPASE, BLOOD: Lipase: 38 U/L (ref 11–51)

## 2016-09-17 LAB — C DIFFICILE QUICK SCREEN W PCR REFLEX
C DIFFICILE (CDIFF) INTERP: NOT DETECTED
C DIFFICILE (CDIFF) TOXIN: NEGATIVE
C DIFFICLE (CDIFF) ANTIGEN: NEGATIVE

## 2016-09-17 LAB — MAGNESIUM: Magnesium: 1.9 mg/dL (ref 1.7–2.4)

## 2016-09-17 LAB — GLUCOSE, CAPILLARY: GLUCOSE-CAPILLARY: 141 mg/dL — AB (ref 65–99)

## 2016-09-17 MED ORDER — HYOSCYAMINE SULFATE ER 0.375 MG PO TB12
0.3750 mg | ORAL_TABLET | Freq: Two times a day (BID) | ORAL | Status: DC | PRN
  Administered 2016-09-17: 0.375 mg via ORAL
  Filled 2016-09-17 (×2): qty 1

## 2016-09-17 MED ORDER — ATORVASTATIN CALCIUM 20 MG PO TABS
40.0000 mg | ORAL_TABLET | Freq: Every day | ORAL | Status: DC
  Administered 2016-09-18: 40 mg via ORAL
  Filled 2016-09-17: qty 2

## 2016-09-17 MED ORDER — LAMOTRIGINE 25 MG PO TABS
200.0000 mg | ORAL_TABLET | Freq: Every day | ORAL | Status: DC
  Administered 2016-09-18: 200 mg via ORAL
  Filled 2016-09-17: qty 8

## 2016-09-17 MED ORDER — IOPAMIDOL (ISOVUE-300) INJECTION 61%
30.0000 mL | Freq: Once | INTRAVENOUS | Status: AC | PRN
  Administered 2016-09-17: 30 mL via ORAL

## 2016-09-17 MED ORDER — BUSPIRONE HCL 5 MG PO TABS
15.0000 mg | ORAL_TABLET | Freq: Three times a day (TID) | ORAL | Status: DC
  Administered 2016-09-17 – 2016-09-18 (×2): 15 mg via ORAL
  Filled 2016-09-17 (×4): qty 3

## 2016-09-17 MED ORDER — SODIUM CHLORIDE 0.9 % IV BOLUS (SEPSIS)
1000.0000 mL | Freq: Once | INTRAVENOUS | Status: AC
  Administered 2016-09-17: 1000 mL via INTRAVENOUS

## 2016-09-17 MED ORDER — LOPERAMIDE HCL 2 MG PO CAPS
2.0000 mg | ORAL_CAPSULE | ORAL | Status: DC | PRN
  Administered 2016-09-17 – 2016-09-18 (×2): 2 mg via ORAL
  Filled 2016-09-17 (×2): qty 1

## 2016-09-17 MED ORDER — TOPIRAMATE 100 MG PO TABS
100.0000 mg | ORAL_TABLET | Freq: Two times a day (BID) | ORAL | Status: DC
  Administered 2016-09-17 – 2016-09-18 (×2): 100 mg via ORAL
  Filled 2016-09-17: qty 4
  Filled 2016-09-17 (×2): qty 1

## 2016-09-17 MED ORDER — FAMOTIDINE 20 MG PO TABS
10.0000 mg | ORAL_TABLET | Freq: Every day | ORAL | Status: DC
  Administered 2016-09-18: 10 mg via ORAL
  Filled 2016-09-17: qty 1

## 2016-09-17 MED ORDER — GABAPENTIN 400 MG PO CAPS
800.0000 mg | ORAL_CAPSULE | Freq: Three times a day (TID) | ORAL | Status: DC
  Administered 2016-09-17 – 2016-09-18 (×2): 800 mg via ORAL
  Filled 2016-09-17 (×2): qty 2

## 2016-09-17 MED ORDER — ENOXAPARIN SODIUM 40 MG/0.4ML ~~LOC~~ SOLN
40.0000 mg | SUBCUTANEOUS | Status: DC
  Administered 2016-09-17: 40 mg via SUBCUTANEOUS
  Filled 2016-09-17: qty 0.4

## 2016-09-17 MED ORDER — ACETAMINOPHEN 325 MG PO TABS: 650.0000 mg | ORAL_TABLET | Freq: Four times a day (QID) | ORAL | Status: DC | PRN

## 2016-09-17 MED ORDER — ONDANSETRON 4 MG PO TBDP: 4.0000 mg | ORAL_TABLET | Freq: Three times a day (TID) | ORAL | Status: DC | PRN

## 2016-09-17 MED ORDER — ONDANSETRON HCL 4 MG PO TABS: 4.0000 mg | ORAL_TABLET | Freq: Four times a day (QID) | ORAL | Status: DC | PRN

## 2016-09-17 MED ORDER — MONTELUKAST SODIUM 10 MG PO TABS
10.0000 mg | ORAL_TABLET | Freq: Every day | ORAL | Status: DC
  Filled 2016-09-17: qty 1

## 2016-09-17 MED ORDER — ALBUTEROL SULFATE (2.5 MG/3ML) 0.083% IN NEBU: 3.0000 mL | INHALATION_SOLUTION | RESPIRATORY_TRACT | Status: DC | PRN

## 2016-09-17 MED ORDER — TIOTROPIUM BROMIDE MONOHYDRATE 18 MCG IN CAPS
18.0000 ug | ORAL_CAPSULE | Freq: Every day | RESPIRATORY_TRACT | Status: DC
  Filled 2016-09-17: qty 5

## 2016-09-17 MED ORDER — AZELASTINE HCL 0.1 % NA SOLN
1.0000 | Freq: Every day | NASAL | Status: DC | PRN
  Filled 2016-09-17: qty 30

## 2016-09-17 MED ORDER — SODIUM CHLORIDE 0.9 % IV SOLN
INTRAVENOUS | Status: DC
  Administered 2016-09-17: 19:00:00 via INTRAVENOUS
  Administered 2016-09-18: 100 mL/h via INTRAVENOUS

## 2016-09-17 MED ORDER — IOPAMIDOL (ISOVUE-300) INJECTION 61%
100.0000 mL | Freq: Once | INTRAVENOUS | Status: AC | PRN
  Administered 2016-09-17: 100 mL via INTRAVENOUS

## 2016-09-17 MED ORDER — LEVOTHYROXINE SODIUM 100 MCG PO TABS
200.0000 ug | ORAL_TABLET | Freq: Every day | ORAL | Status: DC
  Administered 2016-09-18: 200 ug via ORAL
  Filled 2016-09-17: qty 2

## 2016-09-17 MED ORDER — LEVOTHYROXINE SODIUM 25 MCG PO TABS
25.0000 ug | ORAL_TABLET | Freq: Every day | ORAL | Status: DC
  Administered 2016-09-18: 25 ug via ORAL
  Filled 2016-09-17: qty 1

## 2016-09-17 MED ORDER — ONDANSETRON HCL 4 MG/2ML IJ SOLN
4.0000 mg | Freq: Four times a day (QID) | INTRAMUSCULAR | Status: DC | PRN
  Administered 2016-09-18: 4 mg via INTRAVENOUS
  Filled 2016-09-17: qty 2

## 2016-09-17 MED ORDER — HYDROCODONE-ACETAMINOPHEN 5-325 MG PO TABS
1.0000 | ORAL_TABLET | ORAL | Status: DC | PRN
  Administered 2016-09-17 – 2016-09-18 (×2): 1 via ORAL
  Filled 2016-09-17 (×2): qty 1

## 2016-09-17 MED ORDER — ASPIRIN EC 81 MG PO TBEC
81.0000 mg | DELAYED_RELEASE_TABLET | Freq: Every day | ORAL | Status: DC
  Administered 2016-09-18: 81 mg via ORAL
  Filled 2016-09-17: qty 1

## 2016-09-17 MED ORDER — RANOLAZINE ER 500 MG PO TB12
500.0000 mg | ORAL_TABLET | Freq: Two times a day (BID) | ORAL | Status: DC
  Administered 2016-09-17: 500 mg via ORAL
  Filled 2016-09-17 (×3): qty 1

## 2016-09-17 MED ORDER — ACETAMINOPHEN 650 MG RE SUPP: 650.0000 mg | Freq: Four times a day (QID) | RECTAL | Status: DC | PRN

## 2016-09-17 MED ORDER — INSULIN ASPART 100 UNIT/ML ~~LOC~~ SOLN
0.0000 [IU] | Freq: Three times a day (TID) | SUBCUTANEOUS | Status: DC
  Administered 2016-09-18 (×2): 2 [IU] via SUBCUTANEOUS
  Filled 2016-09-17 (×2): qty 1

## 2016-09-17 NOTE — H&P (Signed)
Marie Clarke    MR#:  [id number]  DATE OF BIRTH:  01/21/1971  DATE OF ADMISSION:  09/17/2016  PRIMARY CARE PHYSICIAN: Sharyne Peach, MD   REQUESTING/REFERRING PHYSICIAN: Dr. Burlene Arnt  CHIEF COMPLAINT: Nausea, vomiting, diarrhea going on for 3 days.    Chief Complaint  Patient presents with  . Diarrhea    HISTORY OF PRESENT ILLNESS:  Marie Clarke  is a 46 y.o. female with a known history of Hypertension, diabetes mellitus type 2, sleep apnea, obesity comes in with nausea, vomiting, diarrhea for the past 3-4 days.  also had low-grade temperature at home. Complains of right-sided abdominal pain. She denies any problems with her appetite/she states that after she she had to go to bathroom.for  BM. Did not have any stool for C. difficile workup in the emergency room. Patient received a liter of fluid and ER physician asked me to admit patient  because of persistent hypotension. She says has multiple episodes of  Foul smelling loose stools  PAST MEDICAL HISTORY:   Past Medical History:  Diagnosis Date  . Anemia   . Anxiety   . Asthma   . Bipolar disorder ([place])   . CAD (coronary artery disease) unk  . CHF (congestive heart failure) ([place])   . COPD (chronic obstructive pulmonary disease) ([place])   . Depression unk  . Diabetes mellitus without complication ([place])   . Diabetes mellitus, type II ([place])   . Drug overdose   . GERD (gastroesophageal reflux disease)   . Headache   . Hyperlipidemia   . Hypertension   . Left leg pain 04/29/2014  . MI (myocardial infarction) ([place])   . Muscle ache 09/16/2014  . Osteoporosis   . Overactive bladder   . Pancreatitis unk  . Reflex sympathetic dystrophy   . Renal insufficiency   . Sleep apnea    pt reported on 2/6/7 she currently is not using CPAP  . Sleep apnea   . Stroke ([place])   . Thyroid disease   . TIA (transient ischemic attack) unk  . TIA (transient ischemic attack)      PAST SURGICAL HISTOIRY:   Past Surgical History:  Procedure Laterality Date  . ABDOMINAL HYSTERECTOMY    . prolapse rectum surgery N/A July 2016  . TONSILLECTOMY      SOCIAL HISTORY:   Social History  Substance Use Topics  . Smoking status: Former Smoker    Packs/day: 1.00    Types: Cigarettes    Quit date: 06/12/2016  . Smokeless tobacco: Never Used  . Alcohol use No    FAMILY HISTORY:   Family History  Problem Relation Age of Onset  . Diabetes Mellitus II Mother   . CAD Mother   . Sleep apnea Mother   . Osteoarthritis Mother   . Osteoporosis Mother   . Anxiety disorder Mother   . Depression Mother   . Bipolar disorder Mother   . Bipolar disorder Father   . Hypertension Father   . Depression Father   . Anxiety disorder Father   . Post-traumatic stress disorder Sister     DRUG ALLERGIES:   Allergies  Allergen Reactions  . Diazepam Hives and Nausea And Vomiting  . Sulfa Antibiotics Hives  . Cephalexin Hives  . Ciprofloxacin Hives  . Flagyl [Metronidazole] Hives  . Levofloxacin Hives  . Vancomycin Hives  . Penicillins Nausea And Vomiting    Has patient had a PCN reaction  causing immediate rash, facial/tongue/throat swelling, SOB or lightheadedness with hypotension: No Has patient had a PCN reaction causing severe rash involving mucus membranes or skin necrosis: No Has patient had a PCN reaction that required hospitalization: No Has patient had a PCN reaction occurring within the last 10 years: No If all of the above answers are "NO", then may proceed with Cephalosporin use.   . Pregabalin Hives and Rash    REVIEW OF SYSTEMS:  CONSTITUTIONAL: No fever, fatigue or weakness.  EYES: No blurred or double vision.  EARS, NOSE, AND THROAT: No tinnitus or ear pain.  RESPIRATORY: No cough, shortness of breath, wheezing or hemoptysis.  CARDIOVASCULAR: No chest pain, orthopnea, edema.  GASTROINTESTINAL:nausea, vomiting, diarrhea.  GENITOURINARY: No dysuria,  hematuria.  ENDOCRINE: No polyuria, nocturia,  HEMATOLOGY: No anemia, easy bruising or bleeding SKIN: No rash or lesion. MUSCULOSKELETAL: No joint pain or arthritis.   NEUROLOGIC: No tingling, numbness, weakness.  PSYCHIATRY: No anxiety or depression.   MEDICATIONS AT HOME:   Prior to Admission medications   Medication Sig Start Date End Date Taking? Authorizing Provider  aspirin EC 81 MG tablet Take 81 mg by mouth daily at 12 noon.   Yes [provider]  atorvastatin (LIPITOR) 40 MG tablet Take 40 mg by mouth at bedtime.    Yes [provider]  bisoprolol (ZEBETA) 10 MG tablet Take by mouth. 09/04/16 09/04/17 Yes [provider]  bumetanide (BUMEX) 2 MG tablet Take by mouth. 09/04/16 09/04/17 Yes [provider]  busPIRone (BUSPAR) 15 MG tablet Take 1 tablet (15 mg total) by mouth 3 (three) times daily. 07/04/16  Yes Rainey Pines, MD  gabapentin (NEURONTIN) 400 MG capsule Take 800 mg by mouth 3 (three) times daily.  04/19/16  Yes [provider]  glipiZIDE (GLUCOTROL XL) 5 MG 24 hr tablet Take by mouth. 03/20/16 01/26/17 Yes [provider]  KLOR-CON M20 20 MEQ tablet Take 20 mEq by mouth once.  10/13/15  Yes [provider]  lamoTRIgine (LAMICTAL) 200 MG tablet Take 1 tablet (200 mg total) by mouth daily. 07/04/16  Yes Rainey Pines, MD  levothyroxine (SYNTHROID, LEVOTHROID) 200 MCG tablet Take 200 mcg by mouth daily.    Yes [provider]  levothyroxine (SYNTHROID, LEVOTHROID) 25 MCG tablet Take by mouth. 06/13/16 05/23/17 Yes [provider]  montelukast (SINGULAIR) 10 MG tablet Take 10 mg by mouth daily.    Yes [provider]  naproxen (NAPROSYN) 500 MG tablet Take 1 tablet (500 mg total) by mouth 2 (two) times daily with a meal. 08/02/15  Yes Hagler, Jami L, PA-C  ranitidine (ZANTAC) 300 MG tablet Take 1 tablet (300 mg total) by mouth 2 (two) times daily. Patient taking differently: Take 300 mg by mouth daily.   11/30/14  Yes Carrie Mew, MD  tiotropium (SPIRIVA) 18 MCG inhalation capsule Place 18 mcg into inhaler and inhale at bedtime.   Yes [provider]  topiramate (TOPAMAX) 100 MG tablet Take 1 tablet (100 mg total) by mouth 2 (two) times daily. 07/04/16  Yes Rainey Pines, MD  albuterol (PROVENTIL HFA;VENTOLIN HFA) 108 (90 BASE) MCG/ACT inhaler Inhale 2 puffs into the lungs every 4 (four) hours as needed for wheezing or shortness of breath.    [provider]  atorvastatin (LIPITOR) 40 MG tablet Take by mouth. 09/27/15 08/04/16  [provider]  azelastine (ASTELIN) 0.1 % nasal spray Place 1 spray into both nostrils daily.    [provider]  chlorpheniramine-HYDROcodone [facility])  10-8 MG/5ML SUER Take 5 mLs by mouth 2 (two) times daily. Patient not taking: Reported on 09/17/2016 02/25/16   Earleen Newport, MD  cyclobenzaprine (FLEXERIL) 5 MG tablet Take 1 tablet (5 mg total) by mouth every 8 (eight) hours as needed for muscle spasms. Patient not taking: Reported on 09/17/2016 08/02/15   Hagler, Jami L, PA-C  glipiZIDE (GLUCOTROL XL) 5 MG 24 hr tablet Take by mouth. 09/27/15 08/04/16  [provider]  ondansetron (ZOFRAN ODT) 4 MG disintegrating tablet Take 1 tablet (4 mg total) by mouth every 8 (eight) hours as needed for nausea or vomiting. 02/25/16   Earleen Newport, MD  oxyCODONE-acetaminophen (PERCOCET) 5-325 MG tablet Take 1 tablet by mouth every 4 (four) hours as needed for severe pain. Patient not taking: Reported on 09/17/2016 07/19/15   [name], PA-C  ranolazine (RANEXA) 500 MG 12 hr tablet Take 500 mg by mouth 2 (two) times daily.    [provider]  traMADol (ULTRAM) 50 MG tablet Take 1 tablet (50 mg total) by mouth every 6 (six) hours as needed. Patient not taking: Reported on 09/17/2016 08/12/16   Sherrie George B, FNP      VITAL SIGNS:  Blood pressure (!) 90/56, pulse 76, temperature 98.4 F (36.9 C),  temperature source Oral, resp. rate 18, height 5\' 4"  (1.626 m), weight 97.1 kg (214 lb), SpO2 94 %.  PHYSICAL EXAMINATION:  GENERAL:  46 y.o.-year-old patient lying in the bed with no acute distress.  EYES: Pupils equal, round, reactive to light and accommodation. No scleral icterus. Extraocular muscles intact.  HEENT: Head atraumatic, normocephalic. Oropharynx and nasopharynx clear.  NECK:  Supple, no jugular venous distention. No thyroid enlargement, no tenderness.  LUNGS: Normal breath sounds bilaterally, no wheezing, rales,rhonchi or crepitation. No use of accessory muscles of respiration.  CARDIOVASCULAR: S1, S2 normal. No murmurs, rubs, or gallops.  ABDOMEN: Slight right-sided tenderness mainly in the upper and lower quadrants  No rebound tenderness. Bowel sounds present. No organomegaly or mass.  EXTREMITIES: No pedal edema, cyanosis, or clubbing.  NEUROLOGIC: Cranial nerves II through XII are intact. Muscle strength 5/5 in all extremities. Sensation intact. Gait not checked.  PSYCHIATRIC: The patient is alert and oriented x 3.  SKIN: No obvious rash, lesion, or ulcer.   LABORATORY PANEL:   CBC  Recent Labs Lab 09/17/16 1218  WBC 7.4  HGB 11.2*  HCT 33.6*  PLT 209   ------------------------------------------------------------------------------------------------------------------  Chemistries   Recent Labs Lab 09/17/16 1218  NA 141  K 3.7  CL 108  CO2 25  GLUCOSE 95  BUN 16  CREATININE 0.91  CALCIUM 9.1  MG 1.9  AST 14*  ALT 10*  ALKPHOS 83  BILITOT 0.6   ------------------------------------------------------------------------------------------------------------------  Cardiac Enzymes No results for input(s): TROPONINI in the last 168 hours. ------------------------------------------------------------------------------------------------------------------  RADIOLOGY:  Ct Abdomen Pelvis W Contrast  Addendum Date: 09/17/2016   ADDENDUM REPORT: 09/17/2016  15:57 ADDENDUM: Patient was sent back to the CT scanner for additional imaging through the lower rectum and anus. While the prior images raise the question of circumferential wall thickening in the distal rectum and anus, these repeat images do not confirm that finding with normal appearance of the distal rectum and anus. Now that the ischiorectal and ischial anal tissues are visualized, there is no finding to suggest the presence of a perirectal abscess or fistula. Of note, CT can be insensitive for tiny perirectal abscess or fistula. Electronically Signed   By: Misty Stanley  M.D.   On: 09/17/2016 15:57   Result Date: 09/17/2016 CLINICAL DATA:  Abdominal pain with diarrhea and rectal pain. EXAM: CT ABDOMEN AND PELVIS WITH CONTRAST TECHNIQUE: Multidetector CT imaging of the abdomen and pelvis was performed using the standard protocol following bolus administration of intravenous contrast. CONTRAST:  155mL ISOVUE-300 IOPAMIDOL (ISOVUE-300) INJECTION 61% COMPARISON:  09/11/2016 FINDINGS: Lower chest:  Compressive atelectasis noted lower lobes. Hepatobiliary: Small area of low attenuation in the anterior liver, adjacent to the falciform ligament, is in a characteristic location for focal fatty deposition. Gallbladder decompressed. No intrahepatic or extrahepatic biliary dilation. Pancreas: No focal mass lesion. No dilatation of the main duct. No intraparenchymal cyst. No peripancreatic edema. Spleen: No splenomegaly. No focal mass lesion. Adrenals/Urinary Tract: Right adrenal gland normal. 18 mm left adrenal nodule is stable comparing back to lumbar spine CT from January of 2017, suggesting benign adrenal adenoma. Right kidney unremarkable. 12 mm exophytic low-density lesion upper pole left kidney (image 27 series 2) has increased in size from 9 mm on the study of 04/26/2016. No hydronephrosis. No evidence for hydroureter. The urinary bladder appears normal for the degree of distention. Stomach/Bowel: Stomach is  nondistended. No gastric wall thickening. No evidence of outlet obstruction. Duodenum is normally positioned as is the ligament of Treitz. No small bowel wall thickening. No small bowel dilatation. The terminal ileum is normal. The appendix is normal.Suture line noted mid to distal sigmoid colon. Prominent perirectal fat is similar to prior. Question mild circumferential wall thickening in the distal rectum/anus. CT imaging did not include the entire anal region on this study. Vascular/Lymphatic: No abdominal aortic aneurysm. No abdominal aortic atherosclerotic calcification. There is no gastrohepatic or hepatoduodenal ligament lymphadenopathy. No intraperitoneal or retroperitoneal lymphadenopathy. No pelvic sidewall lymphadenopathy. Reproductive: Uterus surgically absent.  There is no adnexal mass. Other: No intraperitoneal free fluid. Musculoskeletal: Bone windows reveal no worrisome lytic or sclerotic osseous lesions. IMPRESSION: 1. No acute findings in the abdomen or pelvis. 2. Prominent perirectal fat as before with question circumferential wall thickening distal rectum/ anus. Of note, CT imaging did not include the entire anus and perineal region. 3. Interval progression of 12 mm exophytic low-density lesion upper pole left kidney in the 5 month interval since prior study. While likely an enlarging cysts, follow-up recommended. Consider MRI without and with contrast and 3-6 months to re-evaluate. 4. 18 mm left adrenal nodule stable since January 2017, likely adenoma. Electronically Signed: By: Misty Stanley M.D. On: 09/17/2016 14:44    EKG:   Orders placed or performed during the hospital encounter of 07/03/16  . ED EKG  . ED EKG    IMPRESSION AND PLAN:  #46 year old female patient with nausea, vomiting, diarrhea going on for the past 3-4 days and now has hypotension despite IV hydration in the emergency room. Admitted to hospitalist service under observation, continue IV hydration, check stool for  C. difficile. If bp is stable ,discharged home am.  And patient can have follow-up with GI as an outpatient for malabsorption syndrome workup. Abdomen  CAT scan is unremarkable. #2./ hypotension due to diarrhea;hold  blood pressure medications including diuretics.continue iv hydration.  #3. COPD, tobacco abuse: Patient still smokes half pack of cigarettes per day: Advised to quit smoking, offered assistance, counseled for about 57minutes. 4. CPAP name: Continue CPAP at night #5 history of hypothyroidism: Continue Synthroid at 225 mcgBy mouth daily. #6 history of rectal prolapse: Patient is in the process of seeing a surgeon as an outpatient.  All the records are  reviewed and case discussed with ED provider. Management plans discussed with the patient, family and they are in agreement.  CODE STATUS:full  TOTAL TIME TAKING CARE OF THIS PATIENT:55 minutes.    Epifanio Lesches M.D on 09/17/2016 at 4:28 PM  Between 7am to 6pm - Pager - [phone]  After 6pm go to [email or web address] - password [email or web address] [facility]  Office  [phone]  CC: Primary care physician; Sharyne Peach, MD  Note: This dictation was prepared with Dragon dictation along with smaller phrase technology. Any transcriptional errors that result from this process are unintentional.

## 2016-09-17 NOTE — ED Triage Notes (Addendum)
Pt to ed with c/o abd pain and diarrhea and rectal pain.  Pt reports hx of rectal/intestinal prolapse.  Pt states she has had surgery for same problem x 2.  Pt reports increased pain with BMs and reports she feels a "bulge" at rectum after having diarrhea.

## 2016-09-17 NOTE — Progress Notes (Signed)
Pt. c/o abdominal cramping after having a bowel movement. PRN Tylenol PO offered but pt refused and stated "It will not do anything." On call Dr. Ether Griffins paged and ordered for Hyoscyamine 0.375mg  PO every 12hrs PRN. Will administer as ordered.

## 2016-09-17 NOTE — ED Provider Notes (Addendum)
[facility] Emergency Department Provider Note  ____________________________________________   I have reviewed the triage vital signs and the nursing notes.   HISTORY  Chief Complaint Diarrhea    HPI Marie Clarke is a 46 y.o. female who states that she has had multiple rectal surgeries for prolapse presents today with diarrhea and vomiting and feeling generally weak. She states her rectum hurts when she has diarrhea. Patient just had a CT scan of her abdomen pelvis one week ago for abdominal pain. She is actually had multiple different negative CT scans in the last few months. This includes CT angiogram of the head and neck, another CT of the abdomen and pelvis in March, the patient states that she has been having nonstop vomiting and diarrhea for 3 days. This feels different from the pain that she had a CT scan for last week. She denies fever. 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 Patient has an extensive medical history, see below the subsequent chronic pain multiple different prescriptions for narcotic pain medication last year in the last year, etc..   Past Medical History:  Diagnosis Date  . Anemia   . Anxiety   . Asthma   . Bipolar disorder ([place])   . CAD (coronary artery disease) unk  . CHF (congestive heart failure) ([place])   . COPD (chronic obstructive pulmonary disease) ([place])   . Depression unk  . Diabetes mellitus without complication ([place])   . Diabetes mellitus, type II ([place])   . Drug overdose   . GERD (gastroesophageal reflux disease)   . Headache   . Hyperlipidemia   . Hypertension   . Left leg pain 04/29/2014  . MI (myocardial infarction) ([place])   . Muscle ache 09/16/2014  . Osteoporosis   . Overactive bladder   . Pancreatitis unk  . Reflex sympathetic dystrophy   . Renal insufficiency   . Sleep apnea    pt reported on 2/6/7 she currently is not using CPAP  . Sleep apnea   . Stroke ([place])   .  Thyroid disease   . TIA (transient ischemic attack) unk  . TIA (transient ischemic attack)     Patient Active Problem List   Diagnosis Date Noted  . Contusion of knee, left 10/12/2015  . Sprain of left ankle 10/12/2015  . Strain of left knee 10/12/2015  . Ankle instability 07/20/2015  . Incidental lung nodule 04/28/2015  . Chronic pain 03/28/2015  . Chronic low back pain (Location of Primary Source of Pain) (Bilateral) (L>R) 03/28/2015  . Chronic lower extremity pain (Left) 03/28/2015  . Abdominal wound dehiscence 03/28/2015  . Encounter for pain management planning 03/28/2015  . Morbid obesity ([place]) 03/28/2015  . Abnormal CT scan, lumbar spine 03/28/2015  . Lumbar facet hypertrophy 03/28/2015  . Lumbar facet syndrome (Location of Primary Source of Pain) (Bilateral) (L>R) 03/28/2015  . Lumbar foraminal stenosis (Bilateral) (L5-S1) 03/28/2015  . Chronic ankle pain (Left) 03/28/2015  . CRPS (complex regional pain syndrome) type I of lower limb (left ankle) 03/28/2015  . Neurogenic pain 03/28/2015  . Neuropathic pain 03/28/2015  . Myofascial pain 03/28/2015  . History of suicide attempt 03/28/2015  . 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 Abnormal gait 12/15/2014  . Congestive heart failure ([place]) 11/15/2014  . Abdominal wall abscess 09/20/2014  . 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 Diabetes mellitus, type 2 ([place]) 08/13/2014  . Bipolar affective disorder ([place]) 08/13/2014  . Type 2 diabetes mellitus ([place]) 08/13/2014  .  Rectal prolapse 08/09/2014  . Rectal bleeding 08/09/2014  . Rectal bleed 08/09/2014  . Affective bipolar disorder ([place]) 08/05/2014  . Arteriosclerosis of coronary artery 08/05/2014  . CCF (congestive cardiac failure) ([place]) 08/05/2014  . Chronic kidney disease 08/05/2014  . 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 Apnea, sleep 08/05/2014  . Temporary cerebral vascular dysfunction 08/05/2014  . Polypharmacy 04/29/2014  . Other long term (current) drug therapy  04/29/2014  . 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 Chronic kidney disease, stage III (moderate) 12/14/2013  . Controlled diabetes mellitus type II without complication ([place]) 75/11/2583  . Essential (primary) hypertension 12/03/2013  . Adult hypothyroidism 12/03/2013  . Controlled type 2 diabetes mellitus without complication ([place]) 27/78/2423    Past Surgical History:  Procedure Laterality Date  . ABDOMINAL HYSTERECTOMY    . prolapse rectum surgery N/A July 2016  . TONSILLECTOMY      Prior to Admission medications   Medication Sig Start Date End Date Taking? Authorizing Provider  aspirin EC 81 MG tablet Take 81 mg by mouth daily at 12 noon.   Yes [provider]  atorvastatin (LIPITOR) 40 MG tablet Take 40 mg by mouth at bedtime.    Yes [provider]  bisoprolol (ZEBETA) 10 MG tablet Take by mouth. 09/04/16 09/04/17 Yes [provider]  bumetanide (BUMEX) 2 MG tablet Take by mouth. 09/04/16 09/04/17 Yes [provider]  busPIRone (BUSPAR) 15 MG tablet Take 1 tablet (15 mg total) by mouth 3 (three) times daily. 07/04/16  Yes Rainey Pines, MD  gabapentin (NEURONTIN) 400 MG capsule Take 800 mg by mouth 3 (three) times daily.  04/19/16  Yes [provider]  glipiZIDE (GLUCOTROL XL) 5 MG 24 hr tablet Take by mouth. 03/20/16 01/26/17 Yes [provider]  KLOR-CON M20 20 MEQ tablet Take 20 mEq by mouth once.  10/13/15  Yes [provider]  lamoTRIgine (LAMICTAL) 200 MG tablet Take 1 tablet (200 mg total) by mouth daily. 07/04/16  Yes Rainey Pines, MD  levothyroxine (SYNTHROID, LEVOTHROID) 200 MCG tablet Take 200 mcg by mouth daily.    Yes [provider]  levothyroxine (SYNTHROID, LEVOTHROID) 25 MCG tablet Take by mouth. 06/13/16 05/23/17 Yes [provider]  montelukast (SINGULAIR) 10 MG tablet Take 10 mg by mouth daily.    Yes [provider]  naproxen (NAPROSYN) 500 MG tablet Take 1 tablet (500 mg total) by mouth  2 (two) times daily with a meal. 08/02/15  Yes Hagler, Jami L, PA-C  ranitidine (ZANTAC) 300 MG tablet Take 1 tablet (300 mg total) by mouth 2 (two) times daily. Patient taking differently: Take 300 mg by mouth daily.  11/30/14  Yes Carrie Mew, MD  tiotropium (SPIRIVA) 18 MCG inhalation capsule Place 18 mcg into inhaler and inhale at bedtime.   Yes [provider]  topiramate (TOPAMAX) 100 MG tablet Take 1 tablet (100 mg total) by mouth 2 (two) times daily. 07/04/16  Yes Rainey Pines, MD  albuterol (PROVENTIL HFA;VENTOLIN HFA) 108 (90 BASE) MCG/ACT inhaler Inhale 2 puffs into the lungs every 4 (four) hours as needed for wheezing or shortness of breath.    [provider]  atorvastatin (LIPITOR) 40 MG tablet Take by mouth. 09/27/15 08/04/16  [provider]  azelastine (ASTELIN) 0.1 % nasal spray Place 1 spray into both nostrils daily.    [provider]  chlorpheniramine-HYDROcodone (TUSSIONEX PENNKINETIC ER) 10-8 MG/5ML SUER Take 5 mLs by mouth 2 (two) times daily. Patient not taking: Reported on 09/17/2016 02/25/16  Earleen Newport, MD  cyclobenzaprine (FLEXERIL) 5 MG tablet Take 1 tablet (5 mg total) by mouth every 8 (eight) hours as needed for muscle spasms. Patient not taking: Reported on 09/17/2016 08/02/15   Hagler, Jami L, PA-C  glipiZIDE (GLUCOTROL XL) 5 MG 24 hr tablet Take by mouth. 09/27/15 08/04/16  [provider]  ondansetron (ZOFRAN ODT) 4 MG disintegrating tablet Take 1 tablet (4 mg total) by mouth every 8 (eight) hours as needed for nausea or vomiting. 02/25/16   Earleen Newport, MD  oxyCODONE-acetaminophen (PERCOCET) 5-325 MG tablet Take 1 tablet by mouth every 4 (four) hours as needed for severe pain. Patient not taking: Reported on 09/17/2016 07/19/15   [name], PA-C  ranolazine (RANEXA) 500 MG 12 hr tablet Take 500 mg by mouth 2 (two) times daily.    [provider]  traMADol (ULTRAM) 50 MG tablet Take 1 tablet  (50 mg total) by mouth every 6 (six) hours as needed. Patient not taking: Reported on 09/17/2016 08/12/16   Sherrie George B, FNP    Allergies Diazepam; Sulfa antibiotics; Cephalexin; Ciprofloxacin; Flagyl [metronidazole]; Levofloxacin; Vancomycin; Penicillins; and Pregabalin  Family History  Problem Relation Age of Onset  . Diabetes Mellitus II Mother   . CAD Mother   . Sleep apnea Mother   . Osteoarthritis Mother   . Osteoporosis Mother   . Anxiety disorder Mother   . Depression Mother   . Bipolar disorder Mother   . Bipolar disorder Father   . Hypertension Father   . Depression Father   . Anxiety disorder Father   . Post-traumatic stress disorder Sister     Social History Social History  Substance Use Topics  . Smoking status: Former Smoker    Packs/day: 1.00    Types: Cigarettes    Quit date: 06/12/2016  . Smokeless tobacco: Never Used  . Alcohol use No    Review of Systems Constitutional: No fever/chills Eyes: No visual changes. ENT: No sore throat. No stiff neck no neck pain Cardiovascular: Denies chest pain. Respiratory: Denies shortness of breath. Gastrointestinal:   no vomiting.  No diarrhea.  No constipation. Genitourinary: Negative for dysuria. Musculoskeletal: Negative lower extremity swelling Skin: Negative for rash. Neurological: Negative for severe headaches, focal weakness or numbness.   ____________________________________________   PHYSICAL EXAM:  VITAL SIGNS: ED Triage Vitals  Enc Vitals Group     BP 09/17/16 1230 (!) 84/51     Pulse Rate 09/17/16 1156 72     Resp 09/17/16 1156 18     Temp 09/17/16 1156 98.4 F (36.9 C)     Temp Source 09/17/16 1156 Oral     SpO2 09/17/16 1156 96 %     Weight 09/17/16 1157 214 lb (97.1 kg)     Height 09/17/16 1157 5\' 4"  (1.626 m)     Head Circumference --      Peak Flow --      Pain Score 09/17/16 1156 10     Pain Loc --      Pain Edu? --      Excl. in [place]? --     Constitutional: Patient sleeping  when I enter the room wakes up easily to verbal stimuli, Eyes: Conjunctivae are normal Head: Atraumatic HEENT: No congestion/rhinnorhea. Mucous membranes are moist.  Oropharynx non-erythematous Neck:   Nontender with no meningismus, no masses, no stridor Cardiovascular: Normal rate, regular rhythm. Grossly normal heart sounds.  Good peripheral circulation. Respiratory: Normal respiratory effort.  No retractions. Lungs CTAB.  Abdominal: Soft and nontender. No distention. No guarding no rebound Back:  There is no focal tenderness or step off.  there is no midline tenderness there are no lesions noted. there is no CVA tenderness Rectal exam: Female chaperone present, no prolapse noted, mild tenderness. No active bleeding. Declines internal exam Musculoskeletal: No lower extremity tenderness, no upper extremity tenderness. No joint effusions, no DVT signs strong distal pulses no edema Neurologic:  Normal speech and language. No gross focal neurologic deficits are appreciated.  Skin:  Skin is warm, dry and intact. No rash noted. Psychiatric: Mood and affect are normal. Speech and behavior are normal.  ____________________________________________   LABS (all labs ordered are listed, but only abnormal results are displayed)  Labs Reviewed  COMPREHENSIVE METABOLIC PANEL - Abnormal; Notable for the following:       Result Value   AST 14 (*)    ALT 10 (*)    All other components within normal limits  CBC WITH DIFFERENTIAL/PLATELET - Abnormal; Notable for the following:    RBC 3.54 (*)    Hemoglobin 11.2 (*)    HCT 33.6 (*)    RDW 15.4 (*)    All other components within normal limits  URINALYSIS, COMPLETE (UACMP) WITH MICROSCOPIC - Abnormal; Notable for the following:    Color, Urine COLORLESS (*)    APPearance CLEAR (*)    All other components within normal limits  LIPASE, BLOOD  MAGNESIUM  URINE DRUG SCREEN, QUALITATIVE ([facility] ONLY)    ____________________________________________  EKG  I personally interpreted any EKGs ordered by me or triage  ____________________________________________  RADIOLOGY  I reviewed any imaging ordered by me or triage that were performed during my shift and, if possible, patient and/or family made aware of any abnormal findings. ____________________________________________   PROCEDURES  Procedure(s) performed: None  Procedures  Critical Care performed: None  ____________________________________________   INITIAL IMPRESSION / ASSESSMENT AND PLAN / ED COURSE  Pertinent labs & imaging results that were available during my care of the patient were reviewed by me and considered in my medical decision making (see chart for details).  Patient here with proctalgia which is chronic for her as well as vomiting and diarrhea. No evidence of rectal prolapse. Patient had a recent CT scan of her abdomen and pelvis for abdominal pain and a week ago which did not show any significant pathology. However, she states this is different and worse. Her blood pressure is low, she does run low in the low 100s but this is even lower than that. We are giving her IV fluid, her abdomen is benign. With some reluctance I did do a CT scan which showed no acute pathology however, they were unable to fully visualize the rectum, we have sent her back for repeat imaging. Patient does not have a white count she doesn't have any evidence of renal issues, there is no ketosis, very reassuring blood work and exam however her pressures have remained somewhat low. Urine drug screen is negative. Could be a medication reaction of some variety, very low suspicion for significant abscess or abdominal pathology but we are waiting CT findings.    ____________________________________________   FINAL CLINICAL IMPRESSION(S) / ED DIAGNOSES  Final diagnoses:  None      This chart was dictated using voice recognition software.   Despite best efforts to proofread,  errors can occur which can change meaning.      Schuyler Amor, MD 09/17/16 1547    Schuyler Amor, MD 09/17/16  1603  

## 2016-09-17 NOTE — ED Notes (Signed)
Pt c/o having diarrhea for the past 2 days and today having vomiting with it. Pt states she has a hx of rectal prolapse and feels like it is happening again since having the diarrhea.. Pt c/o a lot of rectal pain.

## 2016-09-17 NOTE — ED Notes (Signed)
Pt is requesting her results. EDP is aware. Pt informed we need a stool specimen but states she just had a BM Prior to orders.

## 2016-09-17 NOTE — ED Notes (Signed)
Pt up to the toilet without any difficulty.

## 2016-09-18 DIAGNOSIS — I959 Hypotension, unspecified: Secondary | ICD-10-CM | POA: Diagnosis not present

## 2016-09-18 LAB — CBC
HEMATOCRIT: 31.8 % — AB (ref 35.0–47.0)
HEMOGLOBIN: 10.5 g/dL — AB (ref 12.0–16.0)
MCH: 31.8 pg (ref 26.0–34.0)
MCHC: 33.1 g/dL (ref 32.0–36.0)
MCV: 96.1 fL (ref 80.0–100.0)
Platelets: 175 10*3/uL (ref 150–440)
RBC: 3.31 MIL/uL — AB (ref 3.80–5.20)
RDW: 15.1 % — ABNORMAL HIGH (ref 11.5–14.5)
WBC: 5.7 10*3/uL (ref 3.6–11.0)

## 2016-09-18 LAB — GLUCOSE, CAPILLARY
Glucose-Capillary: 142 mg/dL — ABNORMAL HIGH (ref 65–99)
Glucose-Capillary: 134 mg/dL — ABNORMAL HIGH (ref 65–99)

## 2016-09-18 LAB — BASIC METABOLIC PANEL
ANION GAP: 4 — AB (ref 5–15)
BUN: 13 mg/dL (ref 6–20)
CHLORIDE: 119 mmol/L — AB (ref 101–111)
CO2: 22 mmol/L (ref 22–32)
Calcium: 8.5 mg/dL — ABNORMAL LOW (ref 8.9–10.3)
Creatinine, Ser: 0.76 mg/dL (ref 0.44–1.00)
GFR calc non Af Amer: >60 mL/min (ref >60)
Glucose, Bld: 132 mg/dL — ABNORMAL HIGH (ref 65–99)
POTASSIUM: 4.2 mmol/L (ref 3.5–5.1)
Sodium: 145 mmol/L (ref 135–145)

## 2016-09-18 MED ORDER — ACETAMINOPHEN 325 MG PO TABS: 325.0000 mg | ORAL_TABLET | Freq: Four times a day (QID) | ORAL | Status: DC | PRN

## 2016-09-18 MED ORDER — BISOPROLOL FUMARATE 10 MG PO TABS: 10.0000 mg | ORAL_TABLET | Freq: Two times a day (BID) | ORAL | 11 refills | Status: DC

## 2016-09-18 MED ORDER — HYDROCODONE-ACETAMINOPHEN 5-325 MG PO TABS: 1.0000 | ORAL_TABLET | Freq: Three times a day (TID) | ORAL | 0 refills | Status: DC | PRN

## 2016-09-18 MED ORDER — BUMETANIDE 2 MG PO TABS: 2.0000 mg | ORAL_TABLET | Freq: Two times a day (BID) | ORAL | 11 refills | Status: DC

## 2016-09-18 MED ORDER — RANOLAZINE ER 500 MG PO TB12: 500.0000 mg | ORAL_TABLET | Freq: Two times a day (BID) | ORAL | 0 refills | Status: DC

## 2016-09-18 NOTE — Discharge Summary (Signed)
[facility] NAME: Marie Clarke    MR#:  [id number]  DATE OF BIRTH:  08/25/1970  DATE OF ADMISSION:  09/17/2016 ADMITTING PHYSICIAN: Epifanio Lesches, MD  DATE OF DISCHARGE:  09/18/16  PRIMARY CARE PHYSICIAN: Sharyne Peach, MD    ADMISSION DIAGNOSIS:  Hypotension, unspecified hypotension type [I95.9] Diarrhea, unspecified type [R19.7]  DISCHARGE DIAGNOSIS:  Active Problems:   Hypotension  Acute gastroenteritis-viral Tobacco abuse disorder SECONDARY DIAGNOSIS:   Past Medical History:  Diagnosis Date  . Anemia   . Anxiety   . Asthma   . Bipolar disorder ([place])   . CAD (coronary artery disease) unk  . CHF (congestive heart failure) ([place])   . COPD (chronic obstructive pulmonary disease) ([place])   . Depression unk  . Diabetes mellitus without complication ([place])   . Diabetes mellitus, type II ([place])   . Drug overdose   . GERD (gastroesophageal reflux disease)   . Headache   . Hyperlipidemia   . Hypertension   . Left leg pain 04/29/2014  . MI (myocardial infarction) ([place])   . Muscle ache 09/16/2014  . Osteoporosis   . Overactive bladder   . Pancreatitis unk  . Reflex sympathetic dystrophy   . Renal insufficiency   . Sleep apnea    pt reported on 2/6/7 she currently is not using CPAP  . Sleep apnea   . Stroke ([place])   . Thyroid disease   . TIA (transient ischemic attack) unk  . TIA (transient ischemic attack)     HOSPITAL COURSE:  HPI Donovan Gatchel  is a 46 y.o. female with a known history of Hypertension, diabetes mellitus type 2, sleep apnea, obesity comes in with nausea, vomiting, diarrhea for the past 3-4 days.  also had low-grade temperature at home. Complains of right-sided abdominal pain. She denies any problems with her appetite/she states that after she she had to go to bathroom.for  BM. Did not have any stool for C. difficile workup in the emergency room. Patient received a liter of fluid and ER physician  asked me to admit patient  because of persistent hypotension. She says has multiple episodes of  Foul smelling loose stools  #1 acute gastroenteritis -viral    clinically improved with supportive treatment  Hypotension resolved with IV fluids  Tolerating diet  Nausea vomiting and diarrhea improved. Last bowel movement last night  Stool for C. difficile toxin and GI panel are negative   CAT scan is unremarkable. Discharge patient home WITH Percocet 5/325 5 tablets with no refills  #2./ hypotension due to diarrhea;hold  blood pressure medications including diuretics. Better with IV fluids  #3. COPD, tobacco abuse: Patient still smokes half pack of cigarettes per day: Advised to quit smoking, offered assistance, counseled for about 86minutes. Patient is leaving the floor to smoke    4. CPAP name: Continue CPAP at night  #5 history of hypothyroidism: Continue Synthroid at 225 mcgBy mouth daily. #6 history of rectal prolapse: Patient is in the process of seeing a surgeon as an outpatient.  Discharge patient home, clinically stable and  DISCHARGE CONDITIONS:   FAIR  CONSULTS OBTAINED:     PROCEDURES   NONE  DRUG ALLERGIES:   Allergies  Allergen Reactions  . Diazepam Hives and Nausea And Vomiting  . Sulfa Antibiotics Hives  . Cephalexin Hives  . Ciprofloxacin Hives  . Flagyl [Metronidazole] Hives  . Levofloxacin Hives  . Vancomycin Hives  . Penicillins Nausea And Vomiting  Has patient had a PCN reaction causing immediate rash, facial/tongue/throat swelling, SOB or lightheadedness with hypotension: No Has patient had a PCN reaction causing severe rash involving mucus membranes or skin necrosis: No Has patient had a PCN reaction that required hospitalization: No Has patient had a PCN reaction occurring within the last 10 years: No If all of the above answers are "NO", then may proceed with Cephalosporin use.   . Pregabalin Hives and Rash    DISCHARGE MEDICATIONS:    Current Discharge Medication List    START taking these medications   Details  acetaminophen (TYLENOL) 325 MG tablet Take 1 tablet (325 mg total) by mouth every 6 (six) hours as needed for mild pain (or Fever >/= 101).    HYDROcodone-acetaminophen (NORCO/VICODIN) 5-325 MG tablet Take 1 tablet by mouth every 8 (eight) hours as needed for moderate pain or severe pain (abdominal pain and cramping). Qty: 5 tablet, Refills: 0      CONTINUE these medications which have CHANGED   Details  bisoprolol (ZEBETA) 10 MG tablet Take 1 tablet (10 mg total) by mouth 2 (two) times daily. Qty: 60 tablet, Refills: 11    bumetanide (BUMEX) 2 MG tablet Take 1 tablet (2 mg total) by mouth 2 (two) times daily. Qty: 60 tablet, Refills: 11    ranolazine (RANEXA) 500 MG 12 hr tablet Take 1 tablet (500 mg total) by mouth 2 (two) times daily. Qty: 60 tablet, Refills: 0      CONTINUE these medications which have NOT CHANGED   Details  aspirin EC 81 MG tablet Take 81 mg by mouth daily at 12 noon.    atorvastatin (LIPITOR) 40 MG tablet Take 40 mg by mouth at bedtime.     busPIRone (BUSPAR) 15 MG tablet Take 1 tablet (15 mg total) by mouth 3 (three) times daily. Qty: 270 tablet, Refills: 0   Associated Diagnoses: Bipolar I disorder, most recent episode mixed (HCC)    gabapentin (NEURONTIN) 400 MG capsule Take 800 mg by mouth 3 (three) times daily.     glipiZIDE (GLUCOTROL XL) 10 MG 24 hr tablet Take 10 mg by mouth.     KLOR-CON M20 20 MEQ tablet Take 20 mEq by mouth once.     lamoTRIgine (LAMICTAL) 200 MG tablet Take 1 tablet (200 mg total) by mouth daily. Qty: 90 tablet, Refills: 0   Associated Diagnoses: Bipolar I disorder, most recent episode mixed ([place])    !! levothyroxine (SYNTHROID, LEVOTHROID) 200 MCG tablet Take 200 mcg by mouth daily.     !! levothyroxine (SYNTHROID, LEVOTHROID) 25 MCG tablet Take 25 mcg by mouth daily before breakfast.     montelukast (SINGULAIR) 10 MG tablet Take 10 mg  by mouth daily.     ranitidine (ZANTAC) 300 MG tablet Take 1 tablet (300 mg total) by mouth 2 (two) times daily. Qty: 60 tablet, Refills: 0    tiotropium (SPIRIVA) 18 MCG inhalation capsule Place 18 mcg into inhaler and inhale at bedtime.    topiramate (TOPAMAX) 100 MG tablet Take 1 tablet (100 mg total) by mouth 2 (two) times daily. Qty: 180 tablet, Refills: 0   Associated Diagnoses: Bipolar I disorder, most recent episode mixed (HCC)    albuterol (PROVENTIL HFA;VENTOLIN HFA) 108 (90 BASE) MCG/ACT inhaler Inhale 2 puffs into the lungs every 4 (four) hours as needed for wheezing or shortness of breath.    azelastine (ASTELIN) 0.1 % nasal spray Place 1 spray into both nostrils daily.    chlorpheniramine-HYDROcodone [facility]  ER) 10-8 MG/5ML SUER Take 5 mLs by mouth 2 (two) times daily. Qty: 140 mL, Refills: 0    ondansetron (ZOFRAN ODT) 4 MG disintegrating tablet Take 1 tablet (4 mg total) by mouth every 8 (eight) hours as needed for nausea or vomiting. Qty: 20 tablet, Refills: 0     !! - Potential duplicate medications found. Please discuss with provider.    STOP taking these medications     naproxen (NAPROSYN) 500 MG tablet      cyclobenzaprine (FLEXERIL) 5 MG tablet      oxyCODONE-acetaminophen (PERCOCET) 5-325 MG tablet      traMADol (ULTRAM) 50 MG tablet          DISCHARGE INSTRUCTIONS:   Follow-up with primary care physician in a week Stop smoking   DIET:  Cardiac diet and Diabetic diet  DISCHARGE CONDITION:  STABLE  ACTIVITY:  Activity as tolerated  OXYGEN:  Home Oxygen: No.   Oxygen Delivery: room air  DISCHARGE LOCATION:  home   If you experience worsening of your admission symptoms, develop shortness of breath, life threatening emergency, suicidal or homicidal thoughts you must seek medical attention immediately by calling 911 or calling your MD immediately  if symptoms less severe.  You Must read complete instructions/literature  along with all the possible adverse reactions/side effects for all the Medicines you take and that have been prescribed to you. Take any new Medicines after you have completely understood and accpet all the possible adverse reactions/side effects.   Please note  You were cared for by a hospitalist during your hospital stay. If you have any questions about your discharge medications or the care you received while you were in the hospital after you are discharged, you can call the unit and asked to speak with the hospitalist on call if the hospitalist that took care of you is not available. Once you are discharged, your primary care physician will handle any further medical issues. Please note that NO REFILLS for any discharge medications will be authorized once you are discharged, as it is imperative that you return to your primary care physician (or establish a relationship with a primary care physician if you do not have one) for your aftercare needs so that they can reassess your need for medications and monitor your lab values.     Today  Chief Complaint  Patient presents with  . Diarrhea   Nausea vomiting and diarrhea resolved, tolerating regular diet, wants to go home Leaving the floor to smoke  ROS:  CONSTITUTIONAL: Denies fevers, chills. Denies any fatigue, weakness.  EYES: Denies blurry vision, double vision, eye pain. EARS, NOSE, THROAT: Denies tinnitus, ear pain, hearing loss. RESPIRATORY: Denies cough, wheeze, shortness of breath.  CARDIOVASCULAR: Denies chest pain, palpitations, edema.  GASTROINTESTINAL: Denies nausea, vomiting, diarrhea, abdominal pain. Denies bright red blood per rectum. GENITOURINARY: Denies dysuria, hematuria. ENDOCRINE: Denies nocturia or thyroid problems. HEMATOLOGIC AND LYMPHATIC: Denies easy bruising or bleeding. SKIN: Denies rash or lesion. MUSCULOSKELETAL: Denies pain in neck, back, shoulder, knees, hips or arthritic symptoms.  NEUROLOGIC: Denies  paralysis, paresthesias.  PSYCHIATRIC: Denies anxiety or depressive symptoms.   VITAL SIGNS:  Blood pressure 107/65, pulse (!) 59, temperature 98.1 F (36.7 C), temperature source Oral, resp. rate 18, height 5\' 4"  (1.626 m), weight 97.9 kg (215 lb 12.8 oz), SpO2 97 %.  I/O:    Intake/Output Summary (Last 24 hours) at 09/18/16 1428 Last data filed at 09/18/16 1300  Gross per 24 hour  Intake  2711.67 ml  Output                0 ml  Net          2711.67 ml    PHYSICAL EXAMINATION:  GENERAL:  46 y.o.-year-old patient lying in the bed with no acute distress.  EYES: Pupils equal, round, reactive to light and accommodation. No scleral icterus. Extraocular muscles intact.  HEENT: Head atraumatic, normocephalic. Oropharynx and nasopharynx clear.  NECK:  Supple, no jugular venous distention. No thyroid enlargement, no tenderness.  LUNGS: Normal breath sounds bilaterally, no wheezing, rales,rhonchi or crepitation. No use of accessory muscles of respiration.  CARDIOVASCULAR: S1, S2 normal. No murmurs, rubs, or gallops.  ABDOMEN: Soft, non-tender, non-distended. Bowel sounds present. No organomegaly or mass.  EXTREMITIES: No pedal edema, cyanosis, or clubbing.  NEUROLOGIC: Cranial nerves II through XII are intact. Muscle strength 5/5 in all extremities. Sensation intact. Gait not checked.  PSYCHIATRIC: The patient is alert and oriented x 3.  SKIN: No obvious rash, lesion, or ulcer.   DATA REVIEW:   CBC  Recent Labs Lab 09/18/16 0519  WBC 5.7  HGB 10.5*  HCT 31.8*  PLT 175    Chemistries   Recent Labs Lab 09/17/16 1218 09/18/16 0519  NA 141 145  K 3.7 4.2  CL 108 119*  CO2 25 22  GLUCOSE 95 132*  BUN 16 13  CREATININE 0.91 0.76  CALCIUM 9.1 8.5*  MG 1.9  --   AST 14*  --   ALT 10*  --   ALKPHOS 83  --   BILITOT 0.6  --     Cardiac Enzymes No results for input(s): TROPONINI in the last 168 hours.  Microbiology Results  Results for orders placed or  performed during the hospital encounter of 09/17/16  C difficile quick scan w PCR reflex     Status: None   Collection Time: 09/17/16  5:12 PM  Result Value Ref Range Status   C Diff antigen NEGATIVE NEGATIVE Final   C Diff toxin NEGATIVE NEGATIVE Final   C Diff interpretation No C. difficile detected.  Final  Gastrointestinal Panel by PCR , Stool     Status: None   Collection Time: 09/17/16  5:12 PM  Result Value Ref Range Status   Campylobacter species NOT DETECTED NOT DETECTED Final   Plesimonas shigelloides NOT DETECTED NOT DETECTED Final   Salmonella species NOT DETECTED NOT DETECTED Final   Yersinia enterocolitica NOT DETECTED NOT DETECTED Final   Vibrio species NOT DETECTED NOT DETECTED Final   Vibrio cholerae NOT DETECTED NOT DETECTED Final   Enteroaggregative E coli (EAEC) NOT DETECTED NOT DETECTED Final   Enteropathogenic E coli (EPEC) NOT DETECTED NOT DETECTED Final   Enterotoxigenic E coli (ETEC) NOT DETECTED NOT DETECTED Final   Shiga like toxin producing E coli (STEC) NOT DETECTED NOT DETECTED Final   Shigella/Enteroinvasive E coli (EIEC) NOT DETECTED NOT DETECTED Final   Cryptosporidium NOT DETECTED NOT DETECTED Final   Cyclospora cayetanensis NOT DETECTED NOT DETECTED Final   Entamoeba histolytica NOT DETECTED NOT DETECTED Final   Giardia lamblia NOT DETECTED NOT DETECTED Final   Adenovirus F40/41 NOT DETECTED NOT DETECTED Final   Astrovirus NOT DETECTED NOT DETECTED Final   Norovirus GI/GII NOT DETECTED NOT DETECTED Final   Rotavirus A NOT DETECTED NOT DETECTED Final   Sapovirus (I, II, IV, and V) NOT DETECTED NOT DETECTED Final    RADIOLOGY:  Ct Abdomen Pelvis W Contrast  Addendum Date: 09/17/2016  ADDENDUM REPORT: 09/17/2016 15:57 ADDENDUM: Patient was sent back to the CT scanner for additional imaging through the lower rectum and anus. While the prior images raise the question of circumferential wall thickening in the distal rectum and anus, these repeat images  do not confirm that finding with normal appearance of the distal rectum and anus. Now that the ischiorectal and ischial anal tissues are visualized, there is no finding to suggest the presence of a perirectal abscess or fistula. Of note, CT can be insensitive for tiny perirectal abscess or fistula. Electronically Signed   By: Misty Stanley M.D.   On: 09/17/2016 15:57   Result Date: 09/17/2016 CLINICAL DATA:  Abdominal pain with diarrhea and rectal pain. EXAM: CT ABDOMEN AND PELVIS WITH CONTRAST TECHNIQUE: Multidetector CT imaging of the abdomen and pelvis was performed using the standard protocol following bolus administration of intravenous contrast. CONTRAST:  125mL ISOVUE-300 IOPAMIDOL (ISOVUE-300) INJECTION 61% COMPARISON:  09/11/2016 FINDINGS: Lower chest:  Compressive atelectasis noted lower lobes. Hepatobiliary: Small area of low attenuation in the anterior liver, adjacent to the falciform ligament, is in a characteristic location for focal fatty deposition. Gallbladder decompressed. No intrahepatic or extrahepatic biliary dilation. Pancreas: No focal mass lesion. No dilatation of the main duct. No intraparenchymal cyst. No peripancreatic edema. Spleen: No splenomegaly. No focal mass lesion. Adrenals/Urinary Tract: Right adrenal gland normal. 18 mm left adrenal nodule is stable comparing back to lumbar spine CT from January of 2017, suggesting benign adrenal adenoma. Right kidney unremarkable. 12 mm exophytic low-density lesion upper pole left kidney (image 27 series 2) has increased in size from 9 mm on the study of 04/26/2016. No hydronephrosis. No evidence for hydroureter. The urinary bladder appears normal for the degree of distention. Stomach/Bowel: Stomach is nondistended. No gastric wall thickening. No evidence of outlet obstruction. Duodenum is normally positioned as is the ligament of Treitz. No small bowel wall thickening. No small bowel dilatation. The terminal ileum is normal. The appendix is  normal.Suture line noted mid to distal sigmoid colon. Prominent perirectal fat is similar to prior. Question mild circumferential wall thickening in the distal rectum/anus. CT imaging did not include the entire anal region on this study. Vascular/Lymphatic: No abdominal aortic aneurysm. No abdominal aortic atherosclerotic calcification. There is no gastrohepatic or hepatoduodenal ligament lymphadenopathy. No intraperitoneal or retroperitoneal lymphadenopathy. No pelvic sidewall lymphadenopathy. Reproductive: Uterus surgically absent.  There is no adnexal mass. Other: No intraperitoneal free fluid. Musculoskeletal: Bone windows reveal no worrisome lytic or sclerotic osseous lesions. IMPRESSION: 1. No acute findings in the abdomen or pelvis. 2. Prominent perirectal fat as before with question circumferential wall thickening distal rectum/ anus. Of note, CT imaging did not include the entire anus and perineal region. 3. Interval progression of 12 mm exophytic low-density lesion upper pole left kidney in the 5 month interval since prior study. While likely an enlarging cysts, follow-up recommended. Consider MRI without and with contrast and 3-6 months to re-evaluate. 4. 18 mm left adrenal nodule stable since January 2017, likely adenoma. Electronically Signed: By: Misty Stanley M.D. On: 09/17/2016 14:44    EKG:   Orders placed or performed during the hospital encounter of 07/03/16  . ED EKG  . ED EKG      Management plans discussed with the patient, family and they are in agreement.  CODE STATUS:     Code Status Orders        Start     Ordered   09/17/16 1614  Full code  Continuous  09/17/16 1615    Code Status History    Date Active Date Inactive Code Status Order ID Comments User Context   08/10/2014  1:00 AM 08/11/2014  5:07 PM DNR [id number]  Lance Coon, MD Inpatient    Advance Directive Documentation     Most Recent Value  Type of Advance Directive  Healthcare Power of Attorney   Pre-existing out of facility DNR order (yellow form or pink MOST form)  -  "MOST" Form in Place?  -      TOTAL TIME TAKING CARE OF THIS PATIENT: 45 minutes.   Note: This dictation was prepared with Dragon dictation along with smaller phrase technology. Any transcriptional errors that result from this process are unintentional.   @[facility] @  on 09/18/2016 at 2:28 PM  Between 7am to 6pm - Pager - [phone]  After 6pm go to [email or web address] - password [email or web address] [facility]  Office  [phone]  CC: Primary care physician; Sharyne Peach, MD

## 2016-09-18 NOTE — Discharge Instructions (Signed)
Follow-up with primary care physician in a week Stop smoking

## 2016-09-18 NOTE — Care Management Obs Status (Signed)
[place] NOTIFICATION   Patient Details  Name: Marie Clarke MRN: [id number] Date of Birth: [birth year]   Medicare Observation Status Notification Given:  Yes    Jolly Mango, RN 09/18/2016, 8:39 AM

## 2016-09-18 NOTE — Progress Notes (Signed)
Pt had a bowel movement, mushy to watery stools noted and complaining of an intermittent cramping abdominal pain. Pt has been given PRN Hyoscyamine as ordered by Dr. Ether Griffins with no relief. Paged Dr. Ether Griffins again and ordered for Norco 1 tablet every 4hrs PRN and Immodium 2mg  every 4hrs PRN. Order placed. Will administer and continue to monitor.

## 2016-09-18 NOTE — Progress Notes (Signed)
Patient discharged home. Walked to visitors entrance. Instructions and prescriptions given to patient. Verbalized understanding.

## 2016-09-18 NOTE — Progress Notes (Signed)
Patient demanding papers from doctor to be discharged. I explained to patient that the doctor is working on it. Patient states that when the two volunteers rounded on her, that I sent them in there to take up time. I explained to patient that I personally did not send volunteers in her room to take up time. Patient then states she wants to walk around. I educated patient that if she were to walk off the unit, that she would need to sign out. Patient agreed.

## 2016-09-19 LAB — HIV ANTIBODY (ROUTINE TESTING W REFLEX): HIV SCREEN 4TH GENERATION: NONREACTIVE

## 2016-09-20 ENCOUNTER — Emergency Department: Payer: Medicare Other

## 2016-09-20 ENCOUNTER — Emergency Department
Admission: EM | Admit: 2016-09-20 | Discharge: 2016-09-20 | Disposition: A | Discharge status: Home / Self Care | Payer: Medicare Other | Attending: Emergency Medicine | Admitting: Emergency Medicine

## 2016-09-20 ENCOUNTER — Other Ambulatory Visit: Payer: Self-pay

## 2016-09-20 DIAGNOSIS — R202 Paresthesia of skin: Secondary | ICD-10-CM | POA: Diagnosis not present

## 2016-09-20 DIAGNOSIS — Z79899 Other long term (current) drug therapy: Secondary | ICD-10-CM | POA: Insufficient documentation

## 2016-09-20 DIAGNOSIS — Z7902 Long term (current) use of antithrombotics/antiplatelets: Secondary | ICD-10-CM | POA: Diagnosis not present

## 2016-09-20 DIAGNOSIS — E119 Type 2 diabetes mellitus without complications: Secondary | ICD-10-CM | POA: Insufficient documentation

## 2016-09-20 DIAGNOSIS — R200 Anesthesia of skin: Secondary | ICD-10-CM | POA: Insufficient documentation

## 2016-09-20 DIAGNOSIS — I509 Heart failure, unspecified: Secondary | ICD-10-CM | POA: Diagnosis not present

## 2016-09-20 DIAGNOSIS — Z7982 Long term (current) use of aspirin: Secondary | ICD-10-CM | POA: Diagnosis not present

## 2016-09-20 DIAGNOSIS — I129 Hypertensive chronic kidney disease with stage 1 through stage 4 chronic kidney disease, or unspecified chronic kidney disease: Secondary | ICD-10-CM | POA: Diagnosis not present

## 2016-09-20 DIAGNOSIS — J45909 Unspecified asthma, uncomplicated: Secondary | ICD-10-CM | POA: Insufficient documentation

## 2016-09-20 DIAGNOSIS — E039 Hypothyroidism, unspecified: Secondary | ICD-10-CM | POA: Diagnosis not present

## 2016-09-20 DIAGNOSIS — I2510 Atherosclerotic heart disease of native coronary artery without angina pectoris: Secondary | ICD-10-CM | POA: Insufficient documentation

## 2016-09-20 DIAGNOSIS — F319 Bipolar disorder, unspecified: Secondary | ICD-10-CM | POA: Diagnosis not present

## 2016-09-20 DIAGNOSIS — Z87891 Personal history of nicotine dependence: Secondary | ICD-10-CM | POA: Insufficient documentation

## 2016-09-20 DIAGNOSIS — N183 Chronic kidney disease, stage 3 (moderate): Secondary | ICD-10-CM | POA: Diagnosis not present

## 2016-09-20 DIAGNOSIS — R29810 Facial weakness: Secondary | ICD-10-CM | POA: Diagnosis present

## 2016-09-20 DIAGNOSIS — Z7984 Long term (current) use of oral hypoglycemic drugs: Secondary | ICD-10-CM | POA: Insufficient documentation

## 2016-09-20 LAB — COMPREHENSIVE METABOLIC PANEL
ALK PHOS: 83 U/L (ref 38–126)
ALT: 12 U/L — AB (ref 14–54)
AST: 18 U/L (ref 15–41)
Albumin: 3.9 g/dL (ref 3.5–5.0)
Anion gap: 9 (ref 5–15)
BILIRUBIN TOTAL: 0.3 mg/dL (ref 0.3–1.2)
BUN: 15 mg/dL (ref 6–20)
CO2: 21 mmol/L — ABNORMAL LOW (ref 22–32)
Calcium: 9.1 mg/dL (ref 8.9–10.3)
Chloride: 111 mmol/L (ref 101–111)
Creatinine, Ser: 0.98 mg/dL (ref 0.44–1.00)
GFR calc Af Amer: >60 mL/min (ref >60)
GFR calc non Af Amer: >60 mL/min (ref >60)
GLUCOSE: 119 mg/dL — AB (ref 65–99)
POTASSIUM: 3.8 mmol/L (ref 3.5–5.1)
Sodium: 141 mmol/L (ref 135–145)
TOTAL PROTEIN: 7.3 g/dL (ref 6.5–8.1)

## 2016-09-20 LAB — DIFFERENTIAL
BASOS ABS: 0 10*3/uL (ref 0–0.1)
Basophils Relative: 0 %
Eosinophils Absolute: 0.1 10*3/uL (ref 0–0.7)
Eosinophils Relative: 1 %
LYMPHS ABS: 2 10*3/uL (ref 1.0–3.6)
Lymphocytes Relative: 22 %
MONOS PCT: 5 %
Monocytes Absolute: 0.5 10*3/uL (ref 0.2–0.9)
NEUTROS ABS: 6.6 10*3/uL — AB (ref 1.4–6.5)
Neutrophils Relative %: 72 %

## 2016-09-20 LAB — CBC
HEMATOCRIT: 35 % (ref 35.0–47.0)
HEMOGLOBIN: 11.8 g/dL — AB (ref 12.0–16.0)
MCH: 32.2 pg (ref 26.0–34.0)
MCHC: 33.8 g/dL (ref 32.0–36.0)
MCV: 95.4 fL (ref 80.0–100.0)
Platelets: 209 10*3/uL (ref 150–440)
RBC: 3.67 MIL/uL — ABNORMAL LOW (ref 3.80–5.20)
RDW: 15.6 % — ABNORMAL HIGH (ref 11.5–14.5)
WBC: 9.2 10*3/uL (ref 3.6–11.0)

## 2016-09-20 LAB — PROTIME-INR
INR: 0.92
Prothrombin Time: 12.3 seconds (ref 11.4–15.2)

## 2016-09-20 LAB — TROPONIN I: Troponin I: <0.03 ng/mL (ref <0.03)

## 2016-09-20 LAB — GLUCOSE, CAPILLARY
Glucose-Capillary: 126 mg/dL — ABNORMAL HIGH (ref 65–99)
Glucose-Capillary: 153 mg/dL — ABNORMAL HIGH (ref 65–99)

## 2016-09-20 LAB — APTT

## 2016-09-20 MED ORDER — CLOPIDOGREL BISULFATE 75 MG PO TABS: 75.0000 mg | ORAL_TABLET | Freq: Every day | ORAL | 0 refills | Status: DC

## 2016-09-20 NOTE — Progress Notes (Signed)
[facility] responded to a code stroke page for pt in ED03. When [facility] arrived, the medical team was evaluating the pt. Pt was alert and oriented. Pt stated that a family member was on the way to hospital. Upon requested, [place] offered prayers for pt and for the medical team. [place] to follow up with pt to provided pastoral care and support as needed.    09/20/16 1600  Clinical Encounter Type  Visited With Patient;Health care provider  Visit Type Initial;Other (Comment)  Referral From Nurse  Consult/Referral To Chaplain  Spiritual Encounters  Spiritual Needs Prayer;Emotional;Other (Comment)

## 2016-09-20 NOTE — Discharge Instructions (Signed)
Your tests today were unremarkable.  It does not appear that you have had an actual stroke.  You should take plavix every day in addition to your aspirin to protect you from strokes or heart attacks due to your high risk for these problems. Follow up with your primary care doctor and cardiologist as soon as possible.  Results for orders placed or performed during the hospital encounter of 09/20/16  Protime-INR  Result Value Ref Range   Prothrombin Time 12.3 11.4 - 15.2 seconds   INR 0.92   APTT  Result Value Ref Range   aPTT <24 (L) 24 - 36 seconds  CBC  Result Value Ref Range   WBC 9.2 3.6 - 11.0 K/uL   RBC 3.67 (L) 3.80 - 5.20 MIL/uL   Hemoglobin 11.8 (L) 12.0 - 16.0 g/dL   HCT 35.0 35.0 - 47.0 %   MCV 95.4 80.0 - 100.0 fL   MCH 32.2 26.0 - 34.0 pg   MCHC 33.8 32.0 - 36.0 g/dL   RDW 15.6 (H) 11.5 - 14.5 %   Platelets 209 150 - 440 K/uL  Differential  Result Value Ref Range   Neutrophils Relative % 72 %   Neutro Abs 6.6 (H) 1.4 - 6.5 K/uL   Lymphocytes Relative 22 %   Lymphs Abs 2.0 1.0 - 3.6 K/uL   Monocytes Relative 5 %   Monocytes Absolute 0.5 0.2 - 0.9 K/uL   Eosinophils Relative 1 %   Eosinophils Absolute 0.1 0 - 0.7 K/uL   Basophils Relative 0 %   Basophils Absolute 0.0 0 - 0.1 K/uL  Comprehensive metabolic panel  Result Value Ref Range   Sodium 141 135 - 145 mmol/L   Potassium 3.8 3.5 - 5.1 mmol/L   Chloride 111 101 - 111 mmol/L   CO2 21 (L) 22 - 32 mmol/L   Glucose, Bld 119 (H) 65 - 99 mg/dL   BUN 15 6 - 20 mg/dL   Creatinine, Ser 0.98 0.44 - 1.00 mg/dL   Calcium 9.1 8.9 - 10.3 mg/dL   Total Protein 7.3 6.5 - 8.1 g/dL   Albumin 3.9 3.5 - 5.0 g/dL   AST 18 15 - 41 U/L   ALT 12 (L) 14 - 54 U/L   Alkaline Phosphatase 83 38 - 126 U/L   Total Bilirubin 0.3 0.3 - 1.2 mg/dL   GFR calc non Af Amer >60 >60 mL/min   GFR calc Af Amer >60 >60 mL/min   Anion gap 9 5 - 15  Troponin I  Result Value Ref Range   Troponin I <0.03 <0.03 ng/mL  Glucose, capillary  Result  Value Ref Range   Glucose-Capillary 153 (H) 65 - 99 mg/dL  Glucose, capillary  Result Value Ref Range   Glucose-Capillary 126 (H) 65 - 99 mg/dL   Comment 1 Notify RN    Mr Brain Wo Contrast  Result Date: 09/20/2016 CLINICAL DATA:  Initial evaluation for acute right-sided facial droop. EXAM: MRI HEAD WITHOUT CONTRAST TECHNIQUE: Multiplanar, multiecho pulse sequences of the brain and surrounding structures were obtained without intravenous contrast. COMPARISON:  Prior CT from earlier the same day. FINDINGS: Brain: Study degraded by motion artifact. Cerebral volume within normal limits for age. No significant cerebral white matter disease for age. No abnormal foci of restricted diffusion to suggest acute or subacute ischemia. Gray-white matter differentiation maintained. No encephalomalacia to suggest chronic infarction. No evidence for acute or chronic intracranial hemorrhage. No mass lesion, midline shift or mass effect. Ventricles normal in  size without evidence for hydrocephalus. No extra-axial fluid collection. Major dural sinuses are grossly patent. Pituitary gland suprasellar region within normal limits. Vascular: Major intracranial vascular flow voids are maintained. 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 Craniocervical junction otherwise normal. Visualized upper cervical spine within normal limits. Bone marrow signal intensity diffusely decreased on T1 weighted imaging, most commonly related to anemia, smoking, or obesity. No discrete osseous lesion. No scalp soft tissue abnormality. Sinuses/Orbits: Globes and orbital soft tissues within normal limits. Paranasal sinuses are clear. No mastoid effusion. Inner ear structures normal. IMPRESSION: Normal MRI of the brain.  No acute intracranial process identified. Electronically Signed   By: Jeannine Boga M.D.   On: 09/20/2016 17:15   Ct Abdomen Pelvis W  Contrast  Addendum Date: 09/17/2016   ADDENDUM REPORT: 09/17/2016 15:57 ADDENDUM: Patient was sent back to the CT scanner for additional imaging through the lower rectum and anus. While the prior images raise the question of circumferential wall thickening in the distal rectum and anus, these repeat images do not confirm that finding with normal appearance of the distal rectum and anus. Now that the ischiorectal and ischial anal tissues are visualized, there is no finding to suggest the presence of a perirectal abscess or fistula. Of note, CT can be insensitive for tiny perirectal abscess or fistula. Electronically Signed   By: Misty Stanley M.D.   On: 09/17/2016 15:57   Result Date: 09/17/2016 CLINICAL DATA:  Abdominal pain with diarrhea and rectal pain. EXAM: CT ABDOMEN AND PELVIS WITH CONTRAST TECHNIQUE: Multidetector CT imaging of the abdomen and pelvis was performed using the standard protocol following bolus administration of intravenous contrast. CONTRAST:  111mL ISOVUE-300 IOPAMIDOL (ISOVUE-300) INJECTION 61% COMPARISON:  09/11/2016 FINDINGS: Lower chest:  Compressive atelectasis noted lower lobes. Hepatobiliary: Small area of low attenuation in the anterior liver, adjacent to the falciform ligament, is in a characteristic location for focal fatty deposition. Gallbladder decompressed. No intrahepatic or extrahepatic biliary dilation. Pancreas: No focal mass lesion. No dilatation of the main duct. No intraparenchymal cyst. No peripancreatic edema. Spleen: No splenomegaly. No focal mass lesion. Adrenals/Urinary Tract: Right adrenal gland normal. 18 mm left adrenal nodule is stable comparing back to lumbar spine CT from January of 2017, suggesting benign adrenal adenoma. Right kidney unremarkable. 12 mm exophytic low-density lesion upper pole left kidney (image 27 series 2) has increased in size from 9 mm on the study of 04/26/2016. No hydronephrosis. No evidence for hydroureter. The urinary bladder appears  normal for the degree of distention. Stomach/Bowel: Stomach is nondistended. No gastric wall thickening. No evidence of outlet obstruction. Duodenum is normally positioned as is the ligament of Treitz. No small bowel wall thickening. No small bowel dilatation. The terminal ileum is normal. The appendix is normal.Suture line noted mid to distal sigmoid colon. Prominent perirectal fat is similar to prior. Question mild circumferential wall thickening in the distal rectum/anus. CT imaging did not include the entire anal region on this study. Vascular/Lymphatic: No abdominal aortic aneurysm. No abdominal aortic atherosclerotic calcification. There is no gastrohepatic or hepatoduodenal ligament lymphadenopathy. No intraperitoneal or retroperitoneal lymphadenopathy. No pelvic sidewall lymphadenopathy. Reproductive: Uterus surgically absent.  There is no adnexal mass. Other: No intraperitoneal free fluid. Musculoskeletal: Bone windows reveal no worrisome lytic or sclerotic osseous lesions. IMPRESSION: 1. No acute findings in the abdomen or pelvis. 2. Prominent perirectal fat as before with question circumferential wall thickening distal rectum/ anus. Of note, CT imaging did not  include the entire anus and perineal region. 3. Interval progression of 12 mm exophytic low-density lesion upper pole left kidney in the 5 month interval since prior study. While likely an enlarging cysts, follow-up recommended. Consider MRI without and with contrast and 3-6 months to re-evaluate. 4. 18 mm left adrenal nodule stable since January 2017, likely adenoma. Electronically Signed: By: Misty Stanley M.D. On: 09/17/2016 14:44   Ct Abdomen Pelvis W Contrast  Result Date: 09/11/2016 CLINICAL DATA:  46 year old female with right lower quadrant abdominal pain for several days. Prior surgery, ventral abdominal hernias. EXAM: CT ABDOMEN AND PELVIS WITH CONTRAST TECHNIQUE: Multidetector CT imaging of the abdomen and pelvis was performed using  the standard protocol following bolus administration of intravenous contrast. CONTRAST:  160mL ISOVUE-300 IOPAMIDOL (ISOVUE-300) INJECTION 61% COMPARISON:  CT Abdomen and Pelvis 04/26/2016 and earlier. FINDINGS: Lower chest: Stable mosaic attenuation at both lung bases. No pericardial or pleural effusion. Cardiac size remains within normal limits. Hepatobiliary: Negative liver and gallbladder. Pancreas: Negative. Spleen: Negative. Adrenals/Urinary Tract: Probable benign left adrenal adenoma appears stable since a lumbar spine CT on 03/03/2015. Normal right adrenal gland. Bilateral renal enhancement and contrast excretion is symmetric and within normal limits. No perinephric stranding. No hydroureter. No urologic calculus identified. Negative course of both ureters. Unremarkable urinary bladder. Stomach/Bowel: Perirectal lipomatosis, otherwise negative rectum. Anastomosis re- identified in the sigmoid colon with no adverse features (series 2, image 68). Surrounding pelvic surgical clips are stable. Negative left colon aside from low-density retained stool which is also present in the transverse and right colon. Oral contrast has just reached the cecum. Negative appendix. Negative terminal ileum. No dilated small bowel loops, but the chronic right paraumbilical ventral abdominal hernia now marginally involves small bowel as seen on series 2, image 54 and sagittal image 47 today. The affected loop is non incarcerated. Stable nearby multifocal mesenteric fat containing hernia (series 2, images 40-44) which do not appear incarcerated. No abdominal free fluid.  Negative stomach and proximal small bowel. Vascular/Lymphatic: Major arterial structures in the abdomen and pelvis are patent and appear normal. Portal venous system is patent. No lymphadenopathy. Reproductive: Surgically absent. Other: Similar appearance of pelvic lipomatosis. No pelvic free fluid. Postoperative changes to the ventral abdominal wall.  Musculoskeletal: No acute osseous abnormality identified. IMPRESSION: 1. Chronic right paramedian ventral abdominal hernias, now partially containing a small bowel loop, but with no evidence of incarceration or bowel obstruction. 2. Otherwise stable CT appearance of the abdomen and pelvis since March. Normal appendix. Sigmoid colon surgical anastomosis with no adverse features. Electronically Signed   By: Genevie Ann M.D.   On: 09/11/2016 18:56   Ct Head Code Stroke Wo Contrast  Addendum Date: 09/20/2016   ADDENDUM REPORT: 09/20/2016 15:31 ADDENDUM: Study discussed by telephone with Dr. Joni Fears in the ED on 09/20/2016 at 1519 hours. Electronically Signed   By: Genevie Ann M.D.   On: 09/20/2016 15:31   Result Date: 09/20/2016 CLINICAL DATA:  Code stroke. 46 year old female with right facial droop since 1440 hours. EXAM: CT HEAD WITHOUT CONTRAST TECHNIQUE: Contiguous axial images were obtained from the base of the skull through the vertex without intravenous contrast. COMPARISON:  CTA head and neck 06/25/2016. FINDINGS: Brain: Normal cerebral volume. No midline shift, ventriculomegaly, mass effect, evidence of mass lesion, intracranial hemorrhage or evidence of cortically based acute infarction. Gray-white matter differentiation is within normal limits throughout the brain. Vascular: No suspicious intracranial vascular hyperdensity. Mild Calcified atherosclerosis at the skull base. Skull: Stable and negative. Sinuses/Orbits:  Visualized paranasal sinuses and mastoids are stable and well pneumatized. Other: Visualized orbits and scalp soft tissues are within normal limits. ASPECTS ([place] Stroke Program Early CT Score) - Ganglionic level infarction (caudate, lentiform nuclei, internal capsule, insula, M1-M3 cortex): 7 - Supraganglionic infarction (M4-M6 cortex): 3 Total score (0-10 with 10 being normal): 10 IMPRESSION: 1. Stable and normal noncontrast CT appearance of the brain. 2. ASPECTS is 10. Electronically Signed: By:  Genevie Ann M.D. On: 09/20/2016 15:17

## 2016-09-20 NOTE — ED Notes (Signed)
Patient transported to MRI 

## 2016-09-20 NOTE — ED Triage Notes (Signed)
Pt from Dr Marianna Payment office with reports that pt was there for routine follow up and at 1440 pt began having rt sided facial droop with rt sided weakness and numbness.

## 2016-09-20 NOTE — ED Provider Notes (Signed)
[facility] Emergency Department Provider Note  ____________________________________________  Time seen: Approximately 3:32 PM  I have reviewed the triage vital signs and the nursing notes.   HISTORY  Chief Complaint Numbness and Facial Droop    HPI Marie Clarke is a 46 y.o. female who reports she was in her usual state of health when she went to her cardiologist clinic for a routine outpatient visit today, and while she was there getting checked in the nurse noticed that the patient seemed to be having difficulty speaking and facial palsy. This was at 2:40 PM. Since then the patient reports onset of right sided numbness and tingling and weakness. No recent head trauma. No vision changes or headache. She rates the symptoms as moderate and without aggravating or alleviating factors.  The patient used to be on aspirin and Plavix but she discontinue the Plavix on her own a few months ago.   Past Medical History:  Diagnosis Date  . Anemia   . Anxiety   . Asthma   . Bipolar disorder ([place])   . CAD (coronary artery disease) unk  . CHF (congestive heart failure) ([place])   . COPD (chronic obstructive pulmonary disease) ([place])   . Depression unk  . Diabetes mellitus without complication ([place])   . Diabetes mellitus, type II ([place])   . Drug overdose   . GERD (gastroesophageal reflux disease)   . Headache   . Hyperlipidemia   . Hypertension   . Left leg pain 04/29/2014  . MI (myocardial infarction) ([place])   . Muscle ache 09/16/2014  . Osteoporosis   . Overactive bladder   . Pancreatitis unk  . Reflex sympathetic dystrophy   . Renal insufficiency   . Sleep apnea    pt reported on 2/6/7 she currently is not using CPAP  . Sleep apnea   . Stroke ([place])   . Thyroid disease   . TIA (transient ischemic attack) unk  . TIA (transient ischemic attack)      Patient Active Problem List   Diagnosis Date Noted  . Hypotension 09/17/2016  . Contusion of knee, left 10/12/2015   . Sprain of left ankle 10/12/2015  . Strain of left knee 10/12/2015  . Ankle instability 07/20/2015  . Incidental lung nodule 04/28/2015  . Chronic pain 03/28/2015  . Chronic low back pain (Location of Primary Source of Pain) (Bilateral) (L>R) 03/28/2015  . Chronic lower extremity pain (Left) 03/28/2015  . Abdominal wound dehiscence 03/28/2015  . Encounter for pain management planning 03/28/2015  . Morbid obesity ([place]) 03/28/2015  . Abnormal CT scan, lumbar spine 03/28/2015  . Lumbar facet hypertrophy 03/28/2015  . Lumbar facet syndrome (Location of Primary Source of Pain) (Bilateral) (L>R) 03/28/2015  . Lumbar foraminal stenosis (Bilateral) (L5-S1) 03/28/2015  . Chronic ankle pain (Left) 03/28/2015  . CRPS (complex regional pain syndrome) type I of lower limb (left ankle) 03/28/2015  . Neurogenic pain 03/28/2015  . Neuropathic pain 03/28/2015  . Myofascial pain 03/28/2015  . History of suicide attempt 03/28/2015  . 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 Abnormal gait 12/15/2014  . Congestive heart failure ([place]) 11/15/2014  . Abdominal wall abscess 09/20/2014  . 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 Diabetes mellitus, type 2 ([place]) 08/13/2014  . Bipolar affective disorder ([place]) 08/13/2014  . Type 2 diabetes mellitus ([place]) 08/13/2014  . Rectal prolapse 08/09/2014  . Rectal bleeding 08/09/2014  . Rectal bleed 08/09/2014  . Affective bipolar disorder ([place]) 08/05/2014  . Arteriosclerosis of coronary artery 08/05/2014  . CCF (  congestive cardiac failure) ([place]) 08/05/2014  . Chronic kidney disease 08/05/2014  . 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 Apnea, sleep 08/05/2014  . Temporary cerebral vascular dysfunction 08/05/2014  . Polypharmacy 04/29/2014  . Other long term (current) drug therapy 04/29/2014  . 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 Chronic kidney disease, stage III (moderate) 12/14/2013  . Controlled diabetes mellitus type II without complication ([place]) 16/02/930  .  Essential (primary) hypertension 12/03/2013  . Adult hypothyroidism 12/03/2013  . Controlled type 2 diabetes mellitus without complication ([place]) 35/57/3220     Past Surgical History:  Procedure Laterality Date  . ABDOMINAL HYSTERECTOMY    . prolapse rectum surgery N/A July 2016  . TONSILLECTOMY       Prior to Admission medications   Medication Sig Start Date End Date Taking? Authorizing Provider  acetaminophen (TYLENOL) 325 MG tablet Take 1 tablet (325 mg total) by mouth every 6 (six) hours as needed for mild pain (or Fever >/= 101). 09/18/16  Yes Gouru, Aruna, MD  albuterol (PROVENTIL HFA;VENTOLIN HFA) 108 (90 BASE) MCG/ACT inhaler Inhale 2 puffs into the lungs every 4 (four) hours as needed for wheezing or shortness of breath.   Yes [provider]  aspirin EC 81 MG tablet Take 81 mg by mouth daily at 12 noon.   Yes [provider]  atorvastatin (LIPITOR) 40 MG tablet Take 40 mg by mouth at bedtime.    Yes [provider]  azelastine (ASTELIN) 0.1 % nasal spray Place 1 spray into both nostrils daily.   Yes [provider]  bisoprolol (ZEBETA) 10 MG tablet Take 1 tablet (10 mg total) by mouth 2 (two) times daily. 09/19/16 09/19/17 Yes Gouru, Aruna, MD  bumetanide (BUMEX) 2 MG tablet Take 1 tablet (2 mg total) by mouth 2 (two) times daily. 09/20/16 09/20/17 Yes Gouru, Aruna, MD  busPIRone (BUSPAR) 15 MG tablet Take 1 tablet (15 mg total) by mouth 3 (three) times daily. 07/04/16  Yes Rainey Pines, MD  chlorpheniramine-HYDROcodone (TUSSIONEX PENNKINETIC ER) 10-8 MG/5ML SUER Take 5 mLs by mouth 2 (two) times daily. 02/25/16  Yes Earleen Newport, MD  gabapentin (NEURONTIN) 400 MG capsule Take 800 mg by mouth 3 (three) times daily.  04/19/16  Yes [provider]  glipiZIDE (GLUCOTROL XL) 10 MG 24 hr tablet Take 10 mg by mouth.  03/20/16 01/26/17 Yes [provider]  HYDROcodone-acetaminophen (NORCO/VICODIN) 5-325 MG tablet Take 1 tablet by mouth every  8 (eight) hours as needed for moderate pain or severe pain (abdominal pain and cramping). 09/18/16  Yes Gouru, Aruna, MD  KLOR-CON M20 20 MEQ tablet Take 20 mEq by mouth once.  10/13/15  Yes [provider]  lamoTRIgine (LAMICTAL) 200 MG tablet Take 1 tablet (200 mg total) by mouth daily. 07/04/16  Yes Rainey Pines, MD  levothyroxine (SYNTHROID, LEVOTHROID) 200 MCG tablet Take 200 mcg by mouth daily.    Yes [provider]  levothyroxine (SYNTHROID, LEVOTHROID) 25 MCG tablet Take 25 mcg by mouth daily before breakfast.  06/13/16 05/23/17 Yes [provider]  montelukast (SINGULAIR) 10 MG tablet Take 10 mg by mouth daily.    Yes [provider]  ondansetron (ZOFRAN ODT) 4 MG disintegrating tablet Take 1 tablet (4 mg total) by mouth every 8 (eight) hours as needed for nausea or vomiting. 02/25/16  Yes Earleen Newport, MD  ranitidine (ZANTAC) 300 MG tablet Take 1 tablet (300 mg total) by mouth 2 (two) times daily. Patient taking differently: Take 300 mg by mouth  daily.  11/30/14  Yes Carrie Mew, MD  ranolazine (RANEXA) 500 MG 12 hr tablet Take 1 tablet (500 mg total) by mouth 2 (two) times daily. 09/18/16  Yes Gouru, Illene Silver, MD  tiotropium (SPIRIVA) 18 MCG inhalation capsule Place 18 mcg into inhaler and inhale at bedtime.   Yes [provider]  topiramate (TOPAMAX) 100 MG tablet Take 1 tablet (100 mg total) by mouth 2 (two) times daily. 07/04/16  Yes Rainey Pines, MD  clopidogrel (PLAVIX) 75 MG tablet Take 1 tablet (75 mg total) by mouth daily. 09/20/16 09/20/17  Carrie Mew, MD     Allergies Diazepam; Sulfa antibiotics; Cephalexin; Ciprofloxacin; Flagyl [metronidazole]; Levofloxacin; Vancomycin; Penicillins; and Pregabalin   Family History  Problem Relation Age of Onset  . Diabetes Mellitus II Mother   . CAD Mother   . Sleep apnea Mother   . Osteoarthritis Mother   . Osteoporosis Mother   . Anxiety disorder Mother   . Depression Mother   .  Bipolar disorder Mother   . Bipolar disorder Father   . Hypertension Father   . Depression Father   . Anxiety disorder Father   . Post-traumatic stress disorder Sister     Social History Social History  Substance Use Topics  . Smoking status: Former Smoker    Packs/day: 1.00    Types: Cigarettes    Quit date: 06/12/2016  . Smokeless tobacco: Never Used  . Alcohol use No    Review of Systems  Constitutional:   No fever or chills.  ENT:   No sore throat. No rhinorrhea. Cardiovascular:   No chest pain or syncope. Respiratory:   No dyspnea or cough. Gastrointestinal:   Negative for abdominal pain, vomiting and diarrhea.  Musculoskeletal:   Negative for focal pain or swelling All other systems reviewed and are negative except as documented above in ROS and HPI.  ____________________________________________   PHYSICAL EXAM:  VITAL SIGNS: ED Triage Vitals [09/20/16 1502]  Enc Vitals Group     BP (!) 110/55     Pulse Rate 65     Resp 18     Temp 98.1 F (36.7 C)     Temp Source Oral     SpO2 96 %     Weight 215 lb (97.5 kg)     Height 5\' 4"  (1.626 m)     Head Circumference      Peak Flow      Pain Score 0     Pain Loc      Pain Edu?      Excl. in [place]?     Vital signs reviewed, nursing assessments reviewed.   Constitutional:   Alert and oriented. Not in distress. Eyes:   No scleral icterus.  EOMI. No nystagmus. No conjunctival pallor. PERRL. ENT   Head:   Normocephalic and atraumatic.   Nose:   No congestion/rhinnorhea.    Mouth/Throat:   MMM, no pharyngeal erythema. No peritonsillar mass.    Neck:   No meningismus. Full ROM Hematological/Lymphatic/Immunilogical:   No cervical lymphadenopathy. Cardiovascular:   RRR. Symmetric bilateral radial and DP pulses.  No murmurs.  Respiratory:   Normal respiratory effort without tachypnea/retractions. Breath sounds are clear and equal bilaterally. No wheezes/rales/rhonchi. Gastrointestinal:   Soft and  nontender. Non distended. There is no CVA tenderness.  No rebound, rigidity, or guarding. Genitourinary:   deferred Musculoskeletal:   Normal range of motion in all extremities. No joint effusions.  No lower extremity tenderness.  No edema. Neurologic:  Normal speech. Normal language. 5 out of 5 strength in all extremities.  Effort limited on right, but full strength with encouragement. Intact sensation to sharp stimulus No pronator drift, nl finger-nose CN 2-12 intact NIH stroke scale 0 No gross focal neurologic deficits are appreciated.  Skin:    Skin is warm, dry and intact. No rash noted.  No petechiae, purpura, or bullae.  ____________________________________________    LABS (pertinent positives/negatives) (all labs ordered are listed, but only abnormal results are displayed) Labs Reviewed  APTT - Abnormal; Notable for the following:       Result Value   aPTT <24 (*)    All other components within normal limits  CBC - Abnormal; Notable for the following:    RBC 3.67 (*)    Hemoglobin 11.8 (*)    RDW 15.6 (*)    All other components within normal limits  DIFFERENTIAL - Abnormal; Notable for the following:    Neutro Abs 6.6 (*)    All other components within normal limits  COMPREHENSIVE METABOLIC PANEL - Abnormal; Notable for the following:    CO2 21 (*)    Glucose, Bld 119 (*)    ALT 12 (*)    All other components within normal limits  GLUCOSE, CAPILLARY - Abnormal; Notable for the following:    Glucose-Capillary 153 (*)    All other components within normal limits  GLUCOSE, CAPILLARY - Abnormal; Notable for the following:    Glucose-Capillary 126 (*)    All other components within normal limits  PROTIME-INR  TROPONIN I  CBG MONITORING, ED   ____________________________________________   EKG  Interpreted by me Sinus rhythm rate of 71, normal axis intervals QRS ST segments and T waves  ____________________________________________    RADIOLOGY  Mr Brain  Wo Contrast  Result Date: 09/20/2016 CLINICAL DATA:  Initial evaluation for acute right-sided facial droop. EXAM: MRI HEAD WITHOUT CONTRAST TECHNIQUE: Multiplanar, multiecho pulse sequences of the brain and surrounding structures were obtained without intravenous contrast. COMPARISON:  Prior CT from earlier the same day. FINDINGS: Brain: Study degraded by motion artifact. Cerebral volume within normal limits for age. No significant cerebral white matter disease for age. No abnormal foci of restricted diffusion to suggest acute or subacute ischemia. Gray-white matter differentiation maintained. No encephalomalacia to suggest chronic infarction. No evidence for acute or chronic intracranial hemorrhage. No mass lesion, midline shift or mass effect. Ventricles normal in size without evidence for hydrocephalus. No extra-axial fluid collection. Major dural sinuses are grossly patent. Pituitary gland suprasellar region within normal limits. Vascular: Major intracranial vascular flow voids are maintained. 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 Craniocervical junction otherwise normal. Visualized upper cervical spine within normal limits. Bone marrow signal intensity diffusely decreased on T1 weighted imaging, most commonly related to anemia, smoking, or obesity. No discrete osseous lesion. No scalp soft tissue abnormality. Sinuses/Orbits: Globes and orbital soft tissues within normal limits. Paranasal sinuses are clear. No mastoid effusion. Inner ear structures normal. IMPRESSION: Normal MRI of the brain.  No acute intracranial process identified. Electronically Signed   By: Jeannine Boga M.D.   On: 09/20/2016 17:15   Ct Head Code Stroke Wo Contrast  Addendum Date: 09/20/2016   ADDENDUM REPORT: 09/20/2016 15:31 ADDENDUM: Study discussed by telephone with Dr. Joni Fears in the ED on 09/20/2016 at 1519 hours. Electronically Signed   By: Genevie Ann M.D.   On: 09/20/2016 15:31   Result Date: 09/20/2016 CLINICAL DATA:  Code stroke. 46 year old female with right facial droop since 1440 hours. EXAM: CT HEAD WITHOUT CONTRAST TECHNIQUE: Contiguous axial images were obtained from the base of the skull through the vertex without intravenous contrast. COMPARISON:  CTA head and neck 06/25/2016. FINDINGS: Brain: Normal cerebral volume. No midline shift, ventriculomegaly, mass effect, evidence of mass lesion, intracranial hemorrhage or evidence of cortically based acute infarction. Gray-white matter differentiation is within normal limits throughout the brain. Vascular: No suspicious intracranial vascular hyperdensity. Mild Calcified atherosclerosis at the skull base. Skull: Stable and negative. Sinuses/Orbits: Visualized paranasal sinuses and mastoids are stable and well pneumatized. Other: Visualized orbits and scalp soft tissues are within normal limits. ASPECTS ([place] Stroke Program Early CT Score) - Ganglionic level infarction (caudate, lentiform nuclei, internal capsule, insula, M1-M3 cortex): 7 - Supraganglionic infarction (M4-M6 cortex): 3 Total score (0-10 with 10 being normal): 10 IMPRESSION: 1. Stable and normal noncontrast CT appearance of the brain. 2. ASPECTS is 10. Electronically Signed: By: Genevie Ann M.D. On: 09/20/2016 15:17    ____________________________________________   PROCEDURES Procedures  ____________________________________________   INITIAL IMPRESSION / ASSESSMENT AND PLAN / ED COURSE  Pertinent labs & imaging results that were available during my care of the patient were reviewed by me and considered in my medical decision making (see chart for details).    Clinical Course as of Sep 21 1735  Thu Sep 20, 2016  1527 Not in distress, currently speaking with [facility]. Head CT d/w rads, neg.   [PS]  1541 Discussed with neurologist Dr. Nicole Kindred who reports that he feels that all of these reported symptoms are functional. He  notes that the purported deficits are anatomically inconsistent in that she has right-sided weakness and facial deviation to the right which would imply left-sided weakness. She also notes that the deficits are inconsistent and vanish with distraction. Cranial nerves intact on testing. NIH stroke scale is 0. Does not think she needs any further neurologic workup, except that with her extensive risk factors for stroke and the fact that she self discontinued Plavix, he would recommend an MRI brain today. If negative patient is suitable for outpatient follow-up. I agree and have ordered the MRI. She is otherwise well-appearing without acute complaints. This is also consistent with today's events in that the patient had not noticed anything wrong until she was told by the clinic nurse that they were concerned.  [PS]  3267 Mri neg. F/u pcp/cards. Resume plavix.   [PS]    Clinical Course User Index [PS] Carrie Mew, MD     ____________________________________________   FINAL CLINICAL IMPRESSION(S) / ED DIAGNOSES  Final diagnoses:  Paresthesia      New Prescriptions   CLOPIDOGREL (PLAVIX) 75 MG TABLET    Take 1 tablet (75 mg total) by mouth daily.     Portions of this note were generated with dragon dictation software. Dictation errors may occur despite best attempts at proofreading.    Carrie Mew, MD 09/20/16 [phone]

## 2016-10-01 ENCOUNTER — Ambulatory Visit (INDEPENDENT_AMBULATORY_CARE_PROVIDER_SITE_OTHER): Payer: 59 | Admitting: Psychiatry

## 2016-10-01 DIAGNOSIS — F3160 Bipolar disorder, current episode mixed, unspecified: Secondary | ICD-10-CM

## 2016-10-01 DIAGNOSIS — F4310 Post-traumatic stress disorder, unspecified: Secondary | ICD-10-CM

## 2016-10-01 MED ORDER — LAMOTRIGINE 200 MG PO TABS: 200.0000 mg | ORAL_TABLET | Freq: Every day | ORAL | 0 refills | Status: DC

## 2016-10-01 MED ORDER — TOPIRAMATE 100 MG PO TABS: 100.0000 mg | ORAL_TABLET | Freq: Two times a day (BID) | ORAL | 0 refills | Status: DC

## 2016-10-01 MED ORDER — BUSPIRONE HCL 15 MG PO TABS: 15.0000 mg | ORAL_TABLET | Freq: Three times a day (TID) | ORAL | 0 refills | Status: DC

## 2016-10-01 NOTE — Progress Notes (Signed)
BH MD/PA/NP OP Progress Note  10/01/2016 2:29 PM Marie Clarke  MRN:  [id number]  Subjective:  Patient is a 46 year old female with history of bipolar disorder who presented for follow-up appointment.  She reported that she has felt recently and injured her leg. She stated that she was not wearing her brace. Patient reported now she is going to checked out due to her diabetes as she is concerned about the infection. Reported that she is trying to start smoking cessation as she has been smoking a pack of cigarettes per day. We discussed about different means and I provided her information about her weight now. Patient reported that she has been compliant with her medications. She stated that she has been driving around in the [place] the area. She has not gone too far including [facility]. She stated that the medications has been helpful. She is compliant with them.   She sleeps well at night. She denied having any suicidal homicidal ideations or plans.      Chief Complaint:   Visit Diagnosis:     ICD-10-CM   1. Bipolar I disorder, most recent episode mixed (HCC) F31.60 busPIRone (BUSPAR) 15 MG tablet    lamoTRIgine (LAMICTAL) 200 MG tablet    lamoTRIgine (LAMICTAL) 200 MG tablet    topiramate (TOPAMAX) 100 MG tablet    DISCONTINUED: topiramate (TOPAMAX) 100 MG tablet  2. PTSD (post-traumatic stress disorder) F43.10     Past Medical History:  Past Medical History:  Diagnosis Date  . Anemia   . Anxiety   . Asthma   . Bipolar disorder ([place])   . CAD (coronary artery disease) unk  . CHF (congestive heart failure) ([place])   . COPD (chronic obstructive pulmonary disease) ([place])   . Depression unk  . Diabetes mellitus without complication ([place])   . Diabetes mellitus, type II ([place])   . Drug overdose   . GERD (gastroesophageal reflux disease)   . Headache   . Hyperlipidemia   . Hypertension   . Left leg pain 04/29/2014  . MI (myocardial infarction) ([place])   . Muscle ache  09/16/2014  . Osteoporosis   . Overactive bladder   . Pancreatitis unk  . Reflex sympathetic dystrophy   . Renal insufficiency   . Sleep apnea    pt reported on 2/6/7 she currently is not using CPAP  . Sleep apnea   . Stroke ([place])   . Thyroid disease   . TIA (transient ischemic attack) unk  . TIA (transient ischemic attack)     Past Surgical History:  Procedure Laterality Date  . ABDOMINAL HYSTERECTOMY    . prolapse rectum surgery N/A July 2016  . TONSILLECTOMY     Family History:  Family History  Problem Relation Age of Onset  . Diabetes Mellitus II Mother   . CAD Mother   . Sleep apnea Mother   . Osteoarthritis Mother   . Osteoporosis Mother   . Anxiety disorder Mother   . Depression Mother   . Bipolar disorder Mother   . Bipolar disorder Father   . Hypertension Father   . Depression Father   . Anxiety disorder Father   . Post-traumatic stress disorder Sister    Social History:  Social History   Social History  . Marital status: Single    Spouse name: N/A  . Number of children: N/A  . Years of education: N/A   Social History Main Topics  . Smoking status: Former Smoker    Packs/day: 1.00  Types: Cigarettes    Quit date: 06/12/2016  . Smokeless tobacco: Never Used  . Alcohol use No  . Drug use: No  . Sexual activity: Not Currently   Other Topics Concern  . Not on file   Social History Narrative  . No narrative on file   Additional History:   Assessment:   Musculoskeletal: Strength & Muscle Tone: within normal limits Gait & Station: Slow and ambulates with a cane Patient leans: N/A  Psychiatric Specialty Exam: Medication Refill     Review of Systems  Musculoskeletal: Positive for back pain and joint pain.  Psychiatric/Behavioral: Negative for hallucinations, memory loss, substance abuse and suicidal ideas. Depression: she's reporting some day-to-day fluctuation in her mood from depressed to being upbeat. The patient has insomnia.   All other  systems reviewed and are negative.   There were no vitals taken for this visit.There is no height or weight on file to calculate BMI.  General Appearance: Well Groomed  Eye Contact:  Good  Speech:  Slow  Volume:  Normal  Mood:  Anxious and Depressed  Affect:  Congruent  Thought Process:  Goal Directed  Orientation:  Full (Time, Place, and Person)  Thought Content:  Negative  Suicidal Thoughts:  No  Homicidal Thoughts:  No  Memory:  Immediate;   Good Recent;   Good Remote;   Good  Judgement:  Good  Insight:  Fair  Psychomotor Activity:  Normal  Concentration:  Good  Recall:  Good  Fund of Knowledge: Good  Language: Good  Akathisia:  Negative  Handed:  Right unknown   AIMS (if indicated):  N/A  Assets:  Desire for Improvement Social Support  ADL's:  Intact  Cognition: WNL  Sleep:  good   Is the patient at risk to self?  No. Has the patient been a risk to self in the past 6 months?  No. Has the patient been a risk to self within the distant past?  Yes.   Is the patient a risk to others?  No. Has the patient been a risk to others in the past 6 months?  No. Has the patient been a risk to others within the distant past?  No.  Current Medications: Current Outpatient Prescriptions  Medication Sig Dispense Refill  . acetaminophen (TYLENOL) 325 MG tablet Take 1 tablet (325 mg total) by mouth every 6 (six) hours as needed for mild pain (or Fever >/= 101).    Marland Kitchen albuterol (PROVENTIL HFA;VENTOLIN HFA) 108 (90 BASE) MCG/ACT inhaler Inhale 2 puffs into the lungs every 4 (four) hours as needed for wheezing or shortness of breath.    Marland Kitchen aspirin EC 81 MG tablet Take 81 mg by mouth daily at 12 noon.    Marland Kitchen atorvastatin (LIPITOR) 40 MG tablet Take 40 mg by mouth at bedtime.     Marland Kitchen azelastine (ASTELIN) 0.1 % nasal spray Place 1 spray into both nostrils daily.    . bisoprolol (ZEBETA) 10 MG tablet Take 1 tablet (10 mg total) by mouth 2 (two) times daily. 60 tablet 11  . bumetanide (BUMEX) 2 MG  tablet Take 1 tablet (2 mg total) by mouth 2 (two) times daily. 60 tablet 11  . busPIRone (BUSPAR) 15 MG tablet Take 1 tablet (15 mg total) by mouth 3 (three) times daily. 270 tablet 0  . chlorpheniramine-HYDROcodone (TUSSIONEX PENNKINETIC ER) 10-8 MG/5ML SUER Take 5 mLs by mouth 2 (two) times daily. 140 mL 0  . clopidogrel (PLAVIX) 75 MG tablet Take 1 tablet (  75 mg total) by mouth daily. 30 tablet 0  . gabapentin (NEURONTIN) 400 MG capsule Take 800 mg by mouth 3 (three) times daily.     Marland Kitchen glipiZIDE (GLUCOTROL XL) 10 MG 24 hr tablet Take 10 mg by mouth.     Marland Kitchen HYDROcodone-acetaminophen (NORCO/VICODIN) 5-325 MG tablet Take 1 tablet by mouth every 8 (eight) hours as needed for moderate pain or severe pain (abdominal pain and cramping). 5 tablet 0  . KLOR-CON M20 20 MEQ tablet Take 20 mEq by mouth once.     . lamoTRIgine (LAMICTAL) 200 MG tablet Take 1 tablet (200 mg total) by mouth daily. 90 tablet 0  . lamoTRIgine (LAMICTAL) 200 MG tablet Take 1 tablet (200 mg total) by mouth daily. 90 tablet 0  . levothyroxine (SYNTHROID, LEVOTHROID) 200 MCG tablet Take 200 mcg by mouth daily.     Marland Kitchen levothyroxine (SYNTHROID, LEVOTHROID) 25 MCG tablet Take 25 mcg by mouth daily before breakfast.     . montelukast (SINGULAIR) 10 MG tablet Take 10 mg by mouth daily.     . ondansetron (ZOFRAN ODT) 4 MG disintegrating tablet Take 1 tablet (4 mg total) by mouth every 8 (eight) hours as needed for nausea or vomiting. 20 tablet 0  . ranitidine (ZANTAC) 300 MG tablet Take 1 tablet (300 mg total) by mouth 2 (two) times daily. (Patient taking differently: Take 300 mg by mouth daily. ) 60 tablet 0  . ranolazine (RANEXA) 500 MG 12 hr tablet Take 1 tablet (500 mg total) by mouth 2 (two) times daily. 60 tablet 0  . tiotropium (SPIRIVA) 18 MCG inhalation capsule Place 18 mcg into inhaler and inhale at bedtime.    . topiramate (TOPAMAX) 100 MG tablet Take 1 tablet (100 mg total) by mouth 2 (two) times daily. 180 tablet 0   No  current facility-administered medications for this visit.     Medical Decision Making:  Established Problem, Stable/Improving (1), Review of Medication Regimen & Side Effects (2) and Review of New Medication or Change in Dosage (2)  Treatment Plan Summary:Medication management and Plan   Discussed with patient about her medications. I will adjust her medications as follows Lamotrigine 200 mg daily BuSpar 15 mg by mouth 3 times a day Topamax 100 mg by mouth twice a day Neurontin dose has been adjusted by her pain management and will be prescribed by them.   Patient will follow up in 2  month or earlier depending on her symptoms    More than 50% of the time spent in psychoeducation, counseling and coordination of care.    This note was generated in part or whole with voice recognition software. Voice regonition is usually quite accurate but there are transcription errors that can and very often do occur. I apologize for any typographical errors that were not detected and corrected. Rainey Pines, MD  10/01/2016, 2:29 PM

## 2016-11-26 ENCOUNTER — Ambulatory Visit: Payer: Medicare Other | Admitting: Psychiatry

## 2016-11-26 ENCOUNTER — Emergency Department
Admission: EM | Admit: 2016-11-26 | Discharge: 2016-11-26 | Disposition: A | Discharge status: Home / Self Care | Payer: 59 | Attending: Emergency Medicine | Admitting: Emergency Medicine

## 2016-11-26 ENCOUNTER — Emergency Department: Payer: 59

## 2016-11-26 ENCOUNTER — Encounter: Payer: Self-pay | Admitting: Emergency Medicine

## 2016-11-26 DIAGNOSIS — E039 Hypothyroidism, unspecified: Secondary | ICD-10-CM | POA: Insufficient documentation

## 2016-11-26 DIAGNOSIS — Z87891 Personal history of nicotine dependence: Secondary | ICD-10-CM | POA: Diagnosis not present

## 2016-11-26 DIAGNOSIS — Z7982 Long term (current) use of aspirin: Secondary | ICD-10-CM | POA: Diagnosis not present

## 2016-11-26 DIAGNOSIS — J45909 Unspecified asthma, uncomplicated: Secondary | ICD-10-CM | POA: Insufficient documentation

## 2016-11-26 DIAGNOSIS — I252 Old myocardial infarction: Secondary | ICD-10-CM | POA: Insufficient documentation

## 2016-11-26 DIAGNOSIS — I2510 Atherosclerotic heart disease of native coronary artery without angina pectoris: Secondary | ICD-10-CM | POA: Insufficient documentation

## 2016-11-26 DIAGNOSIS — Z8673 Personal history of transient ischemic attack (TIA), and cerebral infarction without residual deficits: Secondary | ICD-10-CM | POA: Insufficient documentation

## 2016-11-26 DIAGNOSIS — J449 Chronic obstructive pulmonary disease, unspecified: Secondary | ICD-10-CM | POA: Insufficient documentation

## 2016-11-26 DIAGNOSIS — Z7902 Long term (current) use of antithrombotics/antiplatelets: Secondary | ICD-10-CM | POA: Diagnosis not present

## 2016-11-26 DIAGNOSIS — M25562 Pain in left knee: Secondary | ICD-10-CM | POA: Insufficient documentation

## 2016-11-26 DIAGNOSIS — I110 Hypertensive heart disease with heart failure: Secondary | ICD-10-CM | POA: Insufficient documentation

## 2016-11-26 DIAGNOSIS — E119 Type 2 diabetes mellitus without complications: Secondary | ICD-10-CM | POA: Insufficient documentation

## 2016-11-26 DIAGNOSIS — I509 Heart failure, unspecified: Secondary | ICD-10-CM | POA: Diagnosis not present

## 2016-11-26 DIAGNOSIS — Z7984 Long term (current) use of oral hypoglycemic drugs: Secondary | ICD-10-CM | POA: Insufficient documentation

## 2016-11-26 MED ORDER — NAPROXEN 500 MG PO TABS: 500.0000 mg | ORAL_TABLET | Freq: Two times a day (BID) | ORAL | 0 refills | Status: DC | PRN

## 2016-11-26 NOTE — ED Notes (Signed)
Patient transported to US 

## 2016-11-26 NOTE — ED Provider Notes (Signed)
[facility] Emergency Department Provider Note  ____________________________________________   First MD Initiated Contact with Patient 11/26/16 2211     (approximate)  I have reviewed the triage vital signs and the nursing notes.   HISTORY  Chief Complaint Leg Pain   HPI Marie Clarke is a 46 y.o. female with a history of bipolar disorder as well as complex regional pain syndrome who is presenting emergency Department left knee and lower extremity pain. Said the 20th weeks ago she does spasm in her left lower extremity and fell. She has had left knee pain ever since but is been able to ambulate. However, she says the pain is been persistent and she now notes swelling to the left lower extremity. She says the pain is over the anterior the knee but is extending over the posterior of the knee and down her left leg.   Past Medical History:  Diagnosis Date  . Anemia   . Anxiety   . Asthma   . Bipolar disorder ([place])   . CAD (coronary artery disease) unk  . CHF (congestive heart failure) ([place])   . COPD (chronic obstructive pulmonary disease) ([place])   . Depression unk  . Diabetes mellitus without complication ([place])   . Diabetes mellitus, type II ([place])   . Drug overdose   . GERD (gastroesophageal reflux disease)   . Headache   . Hyperlipidemia   . Hypertension   . Left leg pain 04/29/2014  . MI (myocardial infarction) ([place])   . Muscle ache 09/16/2014  . Osteoporosis   . Overactive bladder   . Pancreatitis unk  . Reflex sympathetic dystrophy   . Renal insufficiency   . Sleep apnea    pt reported on 2/6/7 she currently is not using CPAP  . Sleep apnea   . Stroke ([place])   . Thyroid disease   . TIA (transient ischemic attack) unk  . TIA (transient ischemic attack)     Patient Active Problem List   Diagnosis Date Noted  . Hypotension 09/17/2016  . Contusion of knee, left 10/12/2015  . Sprain of left ankle 10/12/2015  . Strain of left knee 10/12/2015  .  Ankle instability 07/20/2015  . Incidental lung nodule 04/28/2015  . Chronic pain 03/28/2015  . Chronic low back pain (Location of Primary Source of Pain) (Bilateral) (L>R) 03/28/2015  . Chronic lower extremity pain (Left) 03/28/2015  . Abdominal wound dehiscence 03/28/2015  . Encounter for pain management planning 03/28/2015  . Morbid obesity ([place]) 03/28/2015  . Abnormal CT scan, lumbar spine 03/28/2015  . Lumbar facet hypertrophy 03/28/2015  . Lumbar facet syndrome (Location of Primary Source of Pain) (Bilateral) (L>R) 03/28/2015  . Lumbar foraminal stenosis (Bilateral) (L5-S1) 03/28/2015  . Chronic ankle pain (Left) 03/28/2015  . CRPS (complex regional pain syndrome) type I of lower limb (left ankle) 03/28/2015  . Neurogenic pain 03/28/2015  . Neuropathic pain 03/28/2015  . Myofascial pain 03/28/2015  . History of suicide attempt 03/28/2015  . 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 Abnormal gait 12/15/2014  . Congestive heart failure ([place]) 11/15/2014  . Abdominal wall abscess 09/20/2014  . 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 Diabetes mellitus, type 2 ([place]) 08/13/2014  . Bipolar affective disorder ([place]) 08/13/2014  . Type 2 diabetes mellitus ([place]) 08/13/2014  . Rectal prolapse 08/09/2014  . Rectal bleeding 08/09/2014  . Rectal bleed 08/09/2014  . Affective bipolar disorder ([place]) 08/05/2014  . Arteriosclerosis of coronary artery 08/05/2014  . CCF (congestive cardiac failure) ([place]) 08/05/2014  .  Chronic kidney disease 08/05/2014  . 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 Apnea, sleep 08/05/2014  . Temporary cerebral vascular dysfunction 08/05/2014  . Polypharmacy 04/29/2014  . Other long term (current) drug therapy 04/29/2014  . 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 Chronic kidney disease, stage III (moderate) ([place]) 12/14/2013  . Controlled diabetes mellitus type II without complication ([place]) 62/83/1517  . Essential (primary) hypertension 12/03/2013  . Adult hypothyroidism  12/03/2013  . Controlled type 2 diabetes mellitus without complication ([place]) 61/60/7371    Past Surgical History:  Procedure Laterality Date  . ABDOMINAL HYSTERECTOMY    . prolapse rectum surgery N/A July 2016  . TONSILLECTOMY      Prior to Admission medications   Medication Sig Start Date End Date Taking? Authorizing Provider  acetaminophen (TYLENOL) 325 MG tablet Take 1 tablet (325 mg total) by mouth every 6 (six) hours as needed for mild pain (or Fever >/= 101). 09/18/16   Gouru, Aruna, MD  albuterol (PROVENTIL HFA;VENTOLIN HFA) 108 (90 BASE) MCG/ACT inhaler Inhale 2 puffs into the lungs every 4 (four) hours as needed for wheezing or shortness of breath.    [provider]  aspirin EC 81 MG tablet Take 81 mg by mouth daily at 12 noon.    [provider]  atorvastatin (LIPITOR) 40 MG tablet Take 40 mg by mouth at bedtime.     [provider]  azelastine (ASTELIN) 0.1 % nasal spray Place 1 spray into both nostrils daily.    [provider]  bisoprolol (ZEBETA) 10 MG tablet Take 1 tablet (10 mg total) by mouth 2 (two) times daily. 09/19/16 09/19/17  Nicholes Mango, MD  bumetanide (BUMEX) 2 MG tablet Take 1 tablet (2 mg total) by mouth 2 (two) times daily. 09/20/16 09/20/17  Nicholes Mango, MD  busPIRone (BUSPAR) 15 MG tablet Take 1 tablet (15 mg total) by mouth 3 (three) times daily. 10/01/16   Rainey Pines, MD  chlorpheniramine-HYDROcodone (TUSSIONEX PENNKINETIC ER) 10-8 MG/5ML SUER Take 5 mLs by mouth 2 (two) times daily. 02/25/16   Earleen Newport, MD  clopidogrel (PLAVIX) 75 MG tablet Take 1 tablet (75 mg total) by mouth daily. 09/20/16 09/20/17  Carrie Mew, MD  gabapentin (NEURONTIN) 400 MG capsule Take 800 mg by mouth 3 (three) times daily.  04/19/16   [provider]  glipiZIDE (GLUCOTROL XL) 10 MG 24 hr tablet Take 10 mg by mouth.  03/20/16 01/26/17  [provider]  HYDROcodone-acetaminophen (NORCO/VICODIN) 5-325 MG tablet Take 1 tablet by  mouth every 8 (eight) hours as needed for moderate pain or severe pain (abdominal pain and cramping). 09/18/16   Gouru, Illene Silver, MD  KLOR-CON M20 20 MEQ tablet Take 20 mEq by mouth once.  10/13/15   [provider]  lamoTRIgine (LAMICTAL) 200 MG tablet Take 1 tablet (200 mg total) by mouth daily. 10/01/16   Rainey Pines, MD  lamoTRIgine (LAMICTAL) 200 MG tablet Take 1 tablet (200 mg total) by mouth daily. 10/01/16   Rainey Pines, MD  levothyroxine (SYNTHROID, LEVOTHROID) 200 MCG tablet Take 200 mcg by mouth daily.     [provider]  levothyroxine (SYNTHROID, LEVOTHROID) 25 MCG tablet Take 25 mcg by mouth daily before breakfast.  06/13/16 05/23/17  [provider]  montelukast (SINGULAIR) 10 MG tablet Take 10 mg by mouth daily.     [provider]  ondansetron (ZOFRAN ODT) 4 MG disintegrating tablet Take 1 tablet (4 mg total) by mouth every 8 (eight) hours as needed for nausea or  vomiting. 02/25/16   Earleen Newport, MD  ranitidine (ZANTAC) 300 MG tablet Take 1 tablet (300 mg total) by mouth 2 (two) times daily. Patient taking differently: Take 300 mg by mouth daily.  11/30/14   Carrie Mew, MD  ranolazine (RANEXA) 500 MG 12 hr tablet Take 1 tablet (500 mg total) by mouth 2 (two) times daily. 09/18/16   Nicholes Mango, MD  tiotropium (SPIRIVA) 18 MCG inhalation capsule Place 18 mcg into inhaler and inhale at bedtime.    [provider]  topiramate (TOPAMAX) 100 MG tablet Take 1 tablet (100 mg total) by mouth 2 (two) times daily. 10/01/16   Rainey Pines, MD    Allergies Diazepam; Sulfa antibiotics; Cephalexin; Ciprofloxacin; Flagyl [metronidazole]; Levofloxacin; Vancomycin; Penicillins; and Pregabalin  Family History  Problem Relation Age of Onset  . Diabetes Mellitus II Mother   . CAD Mother   . Sleep apnea Mother   . Osteoarthritis Mother   . Osteoporosis Mother   . Anxiety disorder Mother   . Depression Mother   . Bipolar disorder Mother   .  Bipolar disorder Father   . Hypertension Father   . Depression Father   . Anxiety disorder Father   . Post-traumatic stress disorder Sister     Social History Social History  Substance Use Topics  . Smoking status: Former Smoker    Packs/day: 1.00    Types: Cigarettes    Quit date: 06/12/2016  . Smokeless tobacco: Never Used  . Alcohol use No    Review of Systems  Constitutional: No fever/chills Eyes: No visual changes. ENT: No sore throat. Cardiovascular: Denies chest pain. Respiratory: Denies shortness of breath. Gastrointestinal: No abdominal pain.  No nausea, no vomiting.  No diarrhea.  No constipation. Genitourinary: Negative for dysuria. Musculoskeletal: Negative for back pain. Skin: Negative for rash. Neurological: Negative for headaches, focal weakness or numbness.   ____________________________________________   PHYSICAL EXAM:  VITAL SIGNS: ED Triage Vitals  Enc Vitals Group     BP --      Pulse Rate 11/26/16 2140 92     Resp 11/26/16 2140 18     Temp 11/26/16 2140 98.6 F (37 C)     Temp Source 11/26/16 2140 Oral     SpO2 11/26/16 2140 97 %     Weight 11/26/16 2141 235 lb (106.6 kg)     Height 11/26/16 2141 5\' 4"  (1.626 m)     Head Circumference --      Peak Flow --      Pain Score 11/26/16 2140 10     Pain Loc --      Pain Edu? --      Excl. in [place]? --    Constitutional: Alert and oriented. Well appearing and in no acute distress. Eyes: Conjunctivae are normal.  Head: Atraumatic. Nose: No congestion/rhinnorhea. Mouth/Throat: Mucous membranes are moist.  Neck: No stridor.   Cardiovascular: Normal rate, regular rhythm. Grossly normal heart sounds.  Good peripheral circulation. Respiratory: Normal respiratory effort.  No retractions. Lungs CTAB. Gastrointestinal: Soft and nontender. No distention. No CVA tenderness. Musculoskeletal: left lower extremity edema extending from the left knee down to the left foot. There is a dorsalis pedis pulse which  is present to the left foot. Mild tenderness to palpation over the left medial popliteal region without any ropelike structure palpated. There is no erythema or warmth. Neurologic:  Normal speech and language. No gross focal neurologic deficits are appreciated. Skin:  Skin is warm, dry and intact. No  rash noted. Psychiatric: Mood and affect are normal. Speech and behavior are normal.  ____________________________________________   LABS (all labs ordered are listed, but only abnormal results are displayed)  Labs Reviewed - No data to display ____________________________________________  EKG   ____________________________________________  RADIOLOGY  negative knee x-ray.  ultrasound without evidence of DVT. ____________________________________________   PROCEDURES  Procedure(s) performed:   Procedures  Critical Care performed:   ____________________________________________   INITIAL IMPRESSION / ASSESSMENT AND PLAN / ED COURSE  Pertinent labs & imaging results that were available during my care of the patient were reviewed by me and considered in my medical decision making (see chart for details).  DDX: Cartilaginous injury such as to meniscus, ligamentous injury, contusion, DVT, peripheral edema.  As part of my medical decision making, I reviewed the following data within the electronic MEDICAL RECORD NUMBER Notes from prior ED visits  Patient pending ultrasound venous study of the lower extremity.     ----------------------------------------- 11:37 PM on 11/26/2016 -----------------------------------------  Patient with negative x-ray and ultrasound. Plan to Ace wrap the knee and refer the patient to orthopedics for further evaluation and likely MRI. Patient will be given several days of Naprosyn 500 mg. Discussed with the patient elevating the knee as well as rest. She is understandable and to comply. ____________________________________________   FINAL CLINICAL  IMPRESSION(S) / ED DIAGNOSES  knee pain.    NEW MEDICATIONS STARTED DURING THIS VISIT:  New Prescriptions   No medications on file     Note:  This document was prepared using Dragon voice recognition software and may include unintentional dictation errors.     Orbie Pyo, MD 11/26/16 2337

## 2016-11-26 NOTE — ED Triage Notes (Signed)
Pt arrived to the ED for complaints of left knee pain secondary to falling on the left knee. Pt states that she has been taking over the counter medication for it and it has not helped. No swelling noticed in triage, normal range of motion sensation and pulses are intact. Pt is AOx4 in no apparent distress.

## 2016-12-31 ENCOUNTER — Encounter: Payer: Self-pay | Admitting: Psychiatry

## 2016-12-31 ENCOUNTER — Other Ambulatory Visit: Payer: Self-pay

## 2016-12-31 ENCOUNTER — Ambulatory Visit (INDEPENDENT_AMBULATORY_CARE_PROVIDER_SITE_OTHER): Payer: Medicaid Other | Admitting: Psychiatry

## 2016-12-31 VITALS — BP 117/75 | HR 111 | Temp 98.8°F

## 2016-12-31 DIAGNOSIS — F3160 Bipolar disorder, current episode mixed, unspecified: Secondary | ICD-10-CM | POA: Diagnosis not present

## 2016-12-31 MED ORDER — BUSPIRONE HCL 15 MG PO TABS: 15.0000 mg | ORAL_TABLET | Freq: Three times a day (TID) | ORAL | 1 refills | Status: DC

## 2016-12-31 MED ORDER — QUETIAPINE FUMARATE 25 MG PO TABS: 25.0000 mg | ORAL_TABLET | Freq: Two times a day (BID) | ORAL | 2 refills | Status: DC

## 2016-12-31 MED ORDER — LAMOTRIGINE 200 MG PO TABS: 200.0000 mg | ORAL_TABLET | Freq: Every day | ORAL | 1 refills | Status: DC

## 2016-12-31 MED ORDER — TOPIRAMATE 100 MG PO TABS: 100.0000 mg | ORAL_TABLET | Freq: Two times a day (BID) | ORAL | 1 refills | Status: DC

## 2016-12-31 NOTE — Progress Notes (Signed)
BH MD/PA/NP OP Progress Note  12/31/2016 3:09 PM Marie Clarke  MRN:  [id number]  Subjective:  Patient is a 46 year old female with history of bipolar disorder who presented for follow-up appointment.  She reported that she has noticed worsening of her mood symptoms and she has becoming more angry and agitated. Patient reported that she wants to restart the Seroquel which she has taken in the past. She is not sleeping well at night. Patient reported that she is also having problems in her leg and she has an appointment with Dr. Melrose Nakayama tomorrow as they think that the pain medications are not helping her. She is planning to follow-up with a neurologist due to neuropathy and numbness in her legs. Patient stated that she has been compliant with her medications and has also been taking gabapentin on a regular basis. She currently denied having any suicidal homicidal ideations or plans. She appeared calm and alert during the interview. She does not use any drugs or alcohol at this time.        Chief Complaint:  Chief Complaint    Follow-up; Medication Refill     Visit Diagnosis:     ICD-10-CM   1. Bipolar I disorder, most recent episode mixed (HCC) F31.60 topiramate (TOPAMAX) 100 MG tablet    lamoTRIgine (LAMICTAL) 200 MG tablet    busPIRone (BUSPAR) 15 MG tablet    Past Medical History:  Past Medical History:  Diagnosis Date  . Anemia   . Anxiety   . Asthma   . Bipolar disorder ([place])   . CAD (coronary artery disease) unk  . CHF (congestive heart failure) ([place])   . COPD (chronic obstructive pulmonary disease) ([place])   . Depression unk  . Diabetes mellitus without complication ([place])   . Diabetes mellitus, type II ([place])   . Drug overdose   . GERD (gastroesophageal reflux disease)   . Headache   . Hyperlipidemia   . Hypertension   . Left leg pain 04/29/2014  . MI (myocardial infarction) ([place])   . Muscle ache 09/16/2014  . Osteoporosis   . Overactive bladder   . Pancreatitis unk  .  Reflex sympathetic dystrophy   . Renal insufficiency   . Sleep apnea    pt reported on 2/6/7 she currently is not using CPAP  . Sleep apnea   . Stroke ([place])   . Thyroid disease   . TIA (transient ischemic attack) unk  . TIA (transient ischemic attack)     Past Surgical History:  Procedure Laterality Date  . ABDOMINAL HYSTERECTOMY    . prolapse rectum surgery N/A July 2016  . TONSILLECTOMY     Family History:  Family History  Problem Relation Age of Onset  . Diabetes Mellitus II Mother   . CAD Mother   . Sleep apnea Mother   . Osteoarthritis Mother   . Osteoporosis Mother   . Anxiety disorder Mother   . Depression Mother   . Bipolar disorder Mother   . Bipolar disorder Father   . Hypertension Father   . Depression Father   . Anxiety disorder Father   . Post-traumatic stress disorder Sister    Social History:  Social History   Socioeconomic History  . Marital status: Single    Spouse name: None  . Number of children: 0  . Years of education: None  . Highest education level: High school graduate  Social Needs  . Financial resource strain: Not hard at all  . Food insecurity - worry: Never true  .  Food insecurity - inability: Never true  . Transportation needs - medical: Yes  . Transportation needs - non-medical: Yes  Occupational History    Comment: not employed  Tobacco Use  . Smoking status: Former Smoker    Packs/day: 1.00    Types: Cigarettes    Last attempt to quit: 06/12/2016    Years since quitting: 0.5  . Smokeless tobacco: Never Used  Substance and Sexual Activity  . Alcohol use: No    Alcohol/week: 0.0 oz  . Drug use: No  . Sexual activity: Not Currently  Other Topics Concern  . None  Social History Narrative  . None   Additional History:   Assessment:   Musculoskeletal: Strength & Muscle Tone: within normal limits Gait & Station: Slow and ambulates with a cane Patient leans: N/A  Psychiatric Specialty Exam: Medication Refill      Review of Systems  Musculoskeletal: Positive for back pain and joint pain.  Psychiatric/Behavioral: Negative for hallucinations, memory loss, substance abuse and suicidal ideas. Depression: she's reporting some day-to-day fluctuation in her mood from depressed to being upbeat. The patient has insomnia.   All other systems reviewed and are negative.   Blood pressure 117/75, pulse (!) 111, temperature 98.8 F (37.1 C), temperature source Oral.There is no height or weight on file to calculate BMI.  General Appearance: Well Groomed  Eye Contact:  Good  Speech:  Slow  Volume:  Normal  Mood:  Anxious and Depressed  Affect:  Congruent  Thought Process:  Goal Directed  Orientation:  Full (Time, Place, and Person)  Thought Content:  Negative  Suicidal Thoughts:  No  Homicidal Thoughts:  No  Memory:  Immediate;   Good Recent;   Good Remote;   Good  Judgement:  Good  Insight:  Fair  Psychomotor Activity:  Normal  Concentration:  Good  Recall:  Good  Fund of Knowledge: Good  Language: Good  Akathisia:  Negative  Handed:  Right unknown   AIMS (if indicated):  N/A  Assets:  Desire for Improvement Social Support  ADL's:  Intact  Cognition: WNL  Sleep:  good   Is the patient at risk to self?  No. Has the patient been a risk to self in the past 6 months?  No. Has the patient been a risk to self within the distant past?  Yes.   Is the patient a risk to others?  No. Has the patient been a risk to others in the past 6 months?  No. Has the patient been a risk to others within the distant past?  No.  Current Medications: Current Outpatient Medications  Medication Sig Dispense Refill  . acetaminophen (TYLENOL) 325 MG tablet Take 1 tablet (325 mg total) by mouth every 6 (six) hours as needed for mild pain (or Fever >/= 101).    Marland Kitchen albuterol (PROVENTIL HFA;VENTOLIN HFA) 108 (90 BASE) MCG/ACT inhaler Inhale 2 puffs into the lungs every 4 (four) hours as needed for wheezing or shortness of  breath.    Marland Kitchen aspirin EC 81 MG tablet Take 81 mg by mouth daily at 12 noon.    Marland Kitchen atorvastatin (LIPITOR) 40 MG tablet Take 40 mg by mouth at bedtime.     Marland Kitchen azelastine (ASTELIN) 0.1 % nasal spray Place 1 spray into both nostrils daily.    . bisoprolol (ZEBETA) 10 MG tablet Take 1 tablet (10 mg total) by mouth 2 (two) times daily. 60 tablet 11  . chlorpheniramine-HYDROcodone (TUSSIONEX PENNKINETIC ER) 10-8 MG/5ML SUER Take  5 mLs by mouth 2 (two) times daily. 140 mL 0  . clopidogrel (PLAVIX) 75 MG tablet Take 1 tablet (75 mg total) by mouth daily. 30 tablet 0  . gabapentin (NEURONTIN) 400 MG capsule Take 800 mg by mouth 3 (three) times daily.     Marland Kitchen glipiZIDE (GLUCOTROL XL) 10 MG 24 hr tablet Take 10 mg by mouth.     Marland Kitchen KLOR-CON M20 20 MEQ tablet Take 20 mEq by mouth once.     . lamoTRIgine (LAMICTAL) 200 MG tablet Take 1 tablet (200 mg total) daily by mouth. 90 tablet 1  . levothyroxine (SYNTHROID, LEVOTHROID) 200 MCG tablet Take 200 mcg by mouth daily.     Marland Kitchen levothyroxine (SYNTHROID, LEVOTHROID) 25 MCG tablet Take 25 mcg by mouth daily before breakfast.     . montelukast (SINGULAIR) 10 MG tablet Take 10 mg by mouth daily.     . naproxen (NAPROSYN) 500 MG tablet Take 1 tablet (500 mg total) by mouth 2 (two) times daily as needed for mild pain or moderate pain. 10 tablet 0  . ondansetron (ZOFRAN ODT) 4 MG disintegrating tablet Take 1 tablet (4 mg total) by mouth every 8 (eight) hours as needed for nausea or vomiting. 20 tablet 0  . QUEtiapine (SEROQUEL) 25 MG tablet Take 1 tablet (25 mg total) 2 (two) times daily by mouth. 60 tablet 2  . ranitidine (ZANTAC) 300 MG tablet Take 1 tablet (300 mg total) by mouth 2 (two) times daily. (Patient taking differently: Take 300 mg by mouth daily. ) 60 tablet 0  . ranolazine (RANEXA) 500 MG 12 hr tablet Take 1 tablet (500 mg total) by mouth 2 (two) times daily. 60 tablet 0  . tiotropium (SPIRIVA) 18 MCG inhalation capsule Place 18 mcg into inhaler and inhale at  bedtime.    . topiramate (TOPAMAX) 100 MG tablet Take 1 tablet (100 mg total) 2 (two) times daily by mouth. 180 tablet 1  . bumetanide (BUMEX) 2 MG tablet Take 1 tablet (2 mg total) by mouth 2 (two) times daily. 60 tablet 11  . busPIRone (BUSPAR) 15 MG tablet Take 1 tablet (15 mg total) 3 (three) times daily by mouth. 270 tablet 1   No current facility-administered medications for this visit.     Medical Decision Making:  Established Problem, Stable/Improving (1), Review of Medication Regimen & Side Effects (2) and Review of New Medication or Change in Dosage (2)  Treatment Plan Summary:Medication management and Plan   Discussed with patient about her medications. Continue medications as follows Lamotrigine 200 mg daily BuSpar 15 mg by mouth 3 times a day Topamax 100 mg by mouth twice a day I will start her on Seroquel 25 mg by mouth twice a day. Advised patient to take melatonin when necessary to help her with sleep and she agreed with the plan    Patient will follow up in 2  month or earlier depending on her symptoms    More than 50% of the time spent in psychoeducation, counseling and coordination of care.    This note was generated in part or whole with voice recognition software. Voice regonition is usually quite accurate but there are transcription errors that can and very often do occur. I apologize for any typographical errors that were not detected and corrected. Rainey Pines, MD  12/31/2016, 3:09 PM

## 2017-01-10 ENCOUNTER — Other Ambulatory Visit: Payer: Self-pay | Admitting: Neurology

## 2017-01-10 DIAGNOSIS — M5416 Radiculopathy, lumbar region: Secondary | ICD-10-CM

## 2017-01-18 ENCOUNTER — Ambulatory Visit
Admission: RE | Admit: 2017-01-18 | Discharge: 2017-01-18 | Disposition: A | Discharge status: Home / Self Care | Payer: 59 | Source: Ambulatory Visit | Attending: Neurology | Admitting: Neurology

## 2017-01-18 ENCOUNTER — Ambulatory Visit: Payer: 59

## 2017-01-18 DIAGNOSIS — M4807 Spinal stenosis, lumbosacral region: Secondary | ICD-10-CM | POA: Diagnosis not present

## 2017-01-18 DIAGNOSIS — M4726 Other spondylosis with radiculopathy, lumbar region: Secondary | ICD-10-CM | POA: Diagnosis not present

## 2017-01-18 DIAGNOSIS — M5410 Radiculopathy, site unspecified: Secondary | ICD-10-CM | POA: Diagnosis present

## 2017-01-18 DIAGNOSIS — M5416 Radiculopathy, lumbar region: Secondary | ICD-10-CM

## 2017-01-18 DIAGNOSIS — M5127 Other intervertebral disc displacement, lumbosacral region: Secondary | ICD-10-CM | POA: Diagnosis not present

## 2017-02-12 DIAGNOSIS — G4733 Obstructive sleep apnea (adult) (pediatric): Secondary | ICD-10-CM | POA: Diagnosis not present

## 2017-02-12 DIAGNOSIS — I2510 Atherosclerotic heart disease of native coronary artery without angina pectoris: Secondary | ICD-10-CM | POA: Diagnosis not present

## 2017-02-13 DIAGNOSIS — I2510 Atherosclerotic heart disease of native coronary artery without angina pectoris: Secondary | ICD-10-CM | POA: Diagnosis not present

## 2017-02-14 DIAGNOSIS — I2510 Atherosclerotic heart disease of native coronary artery without angina pectoris: Secondary | ICD-10-CM | POA: Diagnosis not present

## 2017-02-15 DIAGNOSIS — I2510 Atherosclerotic heart disease of native coronary artery without angina pectoris: Secondary | ICD-10-CM | POA: Diagnosis not present

## 2017-02-16 DIAGNOSIS — I2510 Atherosclerotic heart disease of native coronary artery without angina pectoris: Secondary | ICD-10-CM | POA: Diagnosis not present

## 2017-02-17 DIAGNOSIS — I2510 Atherosclerotic heart disease of native coronary artery without angina pectoris: Secondary | ICD-10-CM | POA: Diagnosis not present

## 2017-02-18 DIAGNOSIS — I2510 Atherosclerotic heart disease of native coronary artery without angina pectoris: Secondary | ICD-10-CM | POA: Diagnosis not present

## 2017-02-19 DIAGNOSIS — Z23 Encounter for immunization: Secondary | ICD-10-CM | POA: Diagnosis not present

## 2017-02-19 DIAGNOSIS — E1122 Type 2 diabetes mellitus with diabetic chronic kidney disease: Secondary | ICD-10-CM | POA: Diagnosis not present

## 2017-02-19 DIAGNOSIS — Z1389 Encounter for screening for other disorder: Secondary | ICD-10-CM | POA: Diagnosis not present

## 2017-02-19 DIAGNOSIS — E039 Hypothyroidism, unspecified: Secondary | ICD-10-CM | POA: Diagnosis not present

## 2017-02-19 DIAGNOSIS — I2510 Atherosclerotic heart disease of native coronary artery without angina pectoris: Secondary | ICD-10-CM | POA: Diagnosis not present

## 2017-02-19 DIAGNOSIS — E785 Hyperlipidemia, unspecified: Secondary | ICD-10-CM | POA: Diagnosis not present

## 2017-02-19 DIAGNOSIS — Z1329 Encounter for screening for other suspected endocrine disorder: Secondary | ICD-10-CM | POA: Diagnosis not present

## 2017-02-20 DIAGNOSIS — I2510 Atherosclerotic heart disease of native coronary artery without angina pectoris: Secondary | ICD-10-CM | POA: Diagnosis not present

## 2017-02-21 DIAGNOSIS — I2510 Atherosclerotic heart disease of native coronary artery without angina pectoris: Secondary | ICD-10-CM | POA: Diagnosis not present

## 2017-02-22 DIAGNOSIS — I2510 Atherosclerotic heart disease of native coronary artery without angina pectoris: Secondary | ICD-10-CM | POA: Diagnosis not present

## 2017-02-23 DIAGNOSIS — I2510 Atherosclerotic heart disease of native coronary artery without angina pectoris: Secondary | ICD-10-CM | POA: Diagnosis not present

## 2017-02-24 DIAGNOSIS — I2510 Atherosclerotic heart disease of native coronary artery without angina pectoris: Secondary | ICD-10-CM | POA: Diagnosis not present

## 2017-02-25 DIAGNOSIS — I2510 Atherosclerotic heart disease of native coronary artery without angina pectoris: Secondary | ICD-10-CM | POA: Diagnosis not present

## 2017-02-26 ENCOUNTER — Other Ambulatory Visit: Payer: Self-pay

## 2017-02-26 ENCOUNTER — Emergency Department: Payer: Medicare HMO

## 2017-02-26 DIAGNOSIS — J449 Chronic obstructive pulmonary disease, unspecified: Secondary | ICD-10-CM | POA: Insufficient documentation

## 2017-02-26 DIAGNOSIS — E1122 Type 2 diabetes mellitus with diabetic chronic kidney disease: Secondary | ICD-10-CM | POA: Insufficient documentation

## 2017-02-26 DIAGNOSIS — Z7984 Long term (current) use of oral hypoglycemic drugs: Secondary | ICD-10-CM | POA: Diagnosis not present

## 2017-02-26 DIAGNOSIS — J45909 Unspecified asthma, uncomplicated: Secondary | ICD-10-CM | POA: Insufficient documentation

## 2017-02-26 DIAGNOSIS — Z7902 Long term (current) use of antithrombotics/antiplatelets: Secondary | ICD-10-CM | POA: Diagnosis not present

## 2017-02-26 DIAGNOSIS — R109 Unspecified abdominal pain: Secondary | ICD-10-CM | POA: Diagnosis not present

## 2017-02-26 DIAGNOSIS — Z8673 Personal history of transient ischemic attack (TIA), and cerebral infarction without residual deficits: Secondary | ICD-10-CM | POA: Diagnosis not present

## 2017-02-26 DIAGNOSIS — Z7982 Long term (current) use of aspirin: Secondary | ICD-10-CM | POA: Diagnosis not present

## 2017-02-26 DIAGNOSIS — Z87891 Personal history of nicotine dependence: Secondary | ICD-10-CM | POA: Diagnosis not present

## 2017-02-26 DIAGNOSIS — N183 Chronic kidney disease, stage 3 (moderate): Secondary | ICD-10-CM | POA: Insufficient documentation

## 2017-02-26 DIAGNOSIS — R1031 Right lower quadrant pain: Secondary | ICD-10-CM | POA: Diagnosis not present

## 2017-02-26 DIAGNOSIS — K5900 Constipation, unspecified: Secondary | ICD-10-CM | POA: Diagnosis not present

## 2017-02-26 DIAGNOSIS — R0602 Shortness of breath: Secondary | ICD-10-CM | POA: Diagnosis not present

## 2017-02-26 DIAGNOSIS — I130 Hypertensive heart and chronic kidney disease with heart failure and stage 1 through stage 4 chronic kidney disease, or unspecified chronic kidney disease: Secondary | ICD-10-CM | POA: Diagnosis not present

## 2017-02-26 DIAGNOSIS — R079 Chest pain, unspecified: Secondary | ICD-10-CM | POA: Diagnosis not present

## 2017-02-26 DIAGNOSIS — R1033 Periumbilical pain: Secondary | ICD-10-CM | POA: Diagnosis present

## 2017-02-26 DIAGNOSIS — R0600 Dyspnea, unspecified: Secondary | ICD-10-CM | POA: Diagnosis not present

## 2017-02-26 DIAGNOSIS — I2510 Atherosclerotic heart disease of native coronary artery without angina pectoris: Secondary | ICD-10-CM | POA: Diagnosis not present

## 2017-02-26 DIAGNOSIS — I509 Heart failure, unspecified: Secondary | ICD-10-CM | POA: Insufficient documentation

## 2017-02-26 LAB — CBC
HEMATOCRIT: 38.2 % (ref 35.0–47.0)
HEMOGLOBIN: 12.7 g/dL (ref 12.0–16.0)
MCH: 30.5 pg (ref 26.0–34.0)
MCHC: 33.3 g/dL (ref 32.0–36.0)
MCV: 91.5 fL (ref 80.0–100.0)
Platelets: 241 10*3/uL (ref 150–440)
RBC: 4.17 MIL/uL (ref 3.80–5.20)
RDW: 14.5 % (ref 11.5–14.5)
WBC: 9.3 10*3/uL (ref 3.6–11.0)

## 2017-02-26 LAB — COMPREHENSIVE METABOLIC PANEL
ALK PHOS: 105 U/L (ref 38–126)
ALT: 15 U/L (ref 14–54)
AST: 20 U/L (ref 15–41)
Albumin: 4.1 g/dL (ref 3.5–5.0)
Anion gap: 12 (ref 5–15)
BILIRUBIN TOTAL: 0.7 mg/dL (ref 0.3–1.2)
BUN: 23 mg/dL — AB (ref 6–20)
CO2: 25 mmol/L (ref 22–32)
Calcium: 9.2 mg/dL (ref 8.9–10.3)
Chloride: 102 mmol/L (ref 101–111)
Creatinine, Ser: 1.14 mg/dL — ABNORMAL HIGH (ref 0.44–1.00)
GFR calc Af Amer: >60 mL/min (ref >60)
GFR, EST NON AFRICAN AMERICAN: 57 mL/min — AB (ref >60)
Glucose, Bld: 189 mg/dL — ABNORMAL HIGH (ref 65–99)
Potassium: 3.5 mmol/L (ref 3.5–5.1)
Sodium: 139 mmol/L (ref 135–145)
TOTAL PROTEIN: 7.7 g/dL (ref 6.5–8.1)

## 2017-02-26 LAB — TROPONIN I

## 2017-02-26 LAB — LIPASE, BLOOD: Lipase: 57 U/L — ABNORMAL HIGH (ref 11–51)

## 2017-02-26 NOTE — ED Triage Notes (Signed)
Pt in with co mid abd pain and left sided chest pain that started 1 hr pta. Pt states does feel some shob, pt has hx of chf and cad. Pt states pain is worse on inspiration and movement. Pt doing shallow breathing, no injury noted.

## 2017-02-27 ENCOUNTER — Emergency Department
Admission: EM | Admit: 2017-02-27 | Discharge: 2017-02-27 | Disposition: A | Discharge status: Home / Self Care | Payer: Medicare HMO | Attending: Emergency Medicine | Admitting: Emergency Medicine

## 2017-02-27 ENCOUNTER — Emergency Department: Payer: Medicare HMO

## 2017-02-27 DIAGNOSIS — K5900 Constipation, unspecified: Secondary | ICD-10-CM | POA: Diagnosis not present

## 2017-02-27 DIAGNOSIS — I2510 Atherosclerotic heart disease of native coronary artery without angina pectoris: Secondary | ICD-10-CM | POA: Diagnosis not present

## 2017-02-27 DIAGNOSIS — R109 Unspecified abdominal pain: Secondary | ICD-10-CM | POA: Diagnosis not present

## 2017-02-27 MED ORDER — KETOROLAC TROMETHAMINE 30 MG/ML IJ SOLN
30.0000 mg | Freq: Once | INTRAMUSCULAR | Status: AC
  Administered 2017-02-27: 30 mg via INTRAVENOUS
  Filled 2017-02-27: qty 1

## 2017-02-27 MED ORDER — ONDANSETRON HCL 4 MG/2ML IJ SOLN
4.0000 mg | Freq: Once | INTRAMUSCULAR | Status: AC
  Administered 2017-02-27: 4 mg via INTRAVENOUS
  Filled 2017-02-27: qty 2

## 2017-02-27 MED ORDER — POLYETHYLENE GLYCOL 3350 17 G PO PACK: 17.0000 g | PACK | Freq: Every day | ORAL | 0 refills | Status: DC | PRN

## 2017-02-27 MED ORDER — IOPAMIDOL (ISOVUE-300) INJECTION 61%
75.0000 mL | Freq: Once | INTRAVENOUS | Status: AC | PRN
  Administered 2017-02-27: 75 mL via INTRAVENOUS

## 2017-02-27 NOTE — ED Notes (Signed)
Pt states she keeps loose stool due to rectal prolapse surgery

## 2017-02-27 NOTE — ED Provider Notes (Signed)
[facility] Emergency Department Provider Note   First MD Initiated Contact with Patient 02/27/17 0131     (approximate)  I have reviewed the triage vital signs and the nursing notes.   HISTORY  Chief Complaint Chest Pain   HPI Marie Clarke is a 47 y.o. female with below list of chronic medical conditions presents to the emergency department with 10 out of 10 periumbilical abdominal pain which patient states has been occurring for the past hour.  Patient describes the pain is aching and sharp.  Patient denies any chest pain while she did state that in triage in addition patient denies any dyspnea.  Patient denies any lower extremity pain or swelling.  Patient denies any nausea or vomiting.  Patient denies any diarrhea or fever   Past Medical History:  Diagnosis Date  . Anemia   . Anxiety   . Asthma   . Bipolar disorder ([place])   . CAD (coronary artery disease) unk  . CHF (congestive heart failure) ([place])   . COPD (chronic obstructive pulmonary disease) ([place])   . Depression unk  . Diabetes mellitus without complication ([place])   . Diabetes mellitus, type II ([place])   . Drug overdose   . GERD (gastroesophageal reflux disease)   . Headache   . Hyperlipidemia   . Hypertension   . Left leg pain 04/29/2014  . MI (myocardial infarction) ([place])   . Muscle ache 09/16/2014  . Osteoporosis   . Overactive bladder   . Pancreatitis unk  . Reflex sympathetic dystrophy   . Renal insufficiency   . Sleep apnea    pt reported on 2/6/7 she currently is not using CPAP  . Sleep apnea   . Stroke ([place])   . Thyroid disease   . TIA (transient ischemic attack) unk  . TIA (transient ischemic attack)     Patient Active Problem List   Diagnosis Date Noted  . Hypotension 09/17/2016  . Contusion of knee, left 10/12/2015  . Sprain of left ankle 10/12/2015  . Strain of left knee 10/12/2015  . Ankle instability 07/20/2015  . Incidental lung nodule 04/28/2015  . Chronic pain  03/28/2015  . Chronic low back pain (Location of Primary Source of Pain) (Bilateral) (L>R) 03/28/2015  . Chronic lower extremity pain (Left) 03/28/2015  . Abdominal wound dehiscence 03/28/2015  . Encounter for pain management planning 03/28/2015  . Morbid obesity ([place]) 03/28/2015  . Abnormal CT scan, lumbar spine 03/28/2015  . Lumbar facet hypertrophy 03/28/2015  . Lumbar facet syndrome (Location of Primary Source of Pain) (Bilateral) (L>R) 03/28/2015  . Lumbar foraminal stenosis (Bilateral) (L5-S1) 03/28/2015  . Chronic ankle pain (Left) 03/28/2015  . CRPS (complex regional pain syndrome) type I of lower limb (left ankle) 03/28/2015  . Neurogenic pain 03/28/2015  . Neuropathic pain 03/28/2015  . Myofascial pain 03/28/2015  . History of suicide attempt 03/28/2015  . 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 Abnormal gait 12/15/2014  . Congestive heart failure ([place]) 11/15/2014  . Abdominal wall abscess 09/20/2014  . 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 Diabetes mellitus, type 2 ([place]) 08/13/2014  . Bipolar affective disorder ([place]) 08/13/2014  . Type 2 diabetes mellitus ([place]) 08/13/2014  . Rectal prolapse 08/09/2014  . Rectal bleeding 08/09/2014  . Rectal bleed 08/09/2014  . Affective bipolar disorder ([place]) 08/05/2014  . Arteriosclerosis of coronary artery 08/05/2014  . CCF (congestive cardiac failure) ([place]) 08/05/2014  . Chronic kidney disease 08/05/2014  . 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 Apnea, sleep 08/05/2014  .  Temporary cerebral vascular dysfunction 08/05/2014  . Polypharmacy 04/29/2014  . Other long term (current) drug therapy 04/29/2014  . 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 Chronic kidney disease, stage III (moderate) ([place]) 12/14/2013  . Controlled diabetes mellitus type II without complication ([place]) 33/29/5188  . Essential (primary) hypertension 12/03/2013  . Adult hypothyroidism 12/03/2013  . Controlled type 2 diabetes mellitus without complication ([place])  41/66/0630    Past Surgical History:  Procedure Laterality Date  . ABDOMINAL HYSTERECTOMY    . prolapse rectum surgery N/A July 2016  . TONSILLECTOMY      Prior to Admission medications   Medication Sig Start Date End Date Taking? Authorizing Provider  acetaminophen (TYLENOL) 325 MG tablet Take 1 tablet (325 mg total) by mouth every 6 (six) hours as needed for mild pain (or Fever >/= 101). 09/18/16   Gouru, Aruna, MD  albuterol (PROVENTIL HFA;VENTOLIN HFA) 108 (90 BASE) MCG/ACT inhaler Inhale 2 puffs into the lungs every 4 (four) hours as needed for wheezing or shortness of breath.    [provider]  aspirin EC 81 MG tablet Take 81 mg by mouth daily at 12 noon.    [provider]  atorvastatin (LIPITOR) 40 MG tablet Take 40 mg by mouth at bedtime.     [provider]  azelastine (ASTELIN) 0.1 % nasal spray Place 1 spray into both nostrils daily.    [provider]  bisoprolol (ZEBETA) 10 MG tablet Take 1 tablet (10 mg total) by mouth 2 (two) times daily. 09/19/16 09/19/17  Nicholes Mango, MD  bumetanide (BUMEX) 2 MG tablet Take 1 tablet (2 mg total) by mouth 2 (two) times daily. 09/20/16 09/20/17  Nicholes Mango, MD  busPIRone (BUSPAR) 15 MG tablet Take 1 tablet (15 mg total) 3 (three) times daily by mouth. 12/31/16   Rainey Pines, MD  chlorpheniramine-HYDROcodone (TUSSIONEX PENNKINETIC ER) 10-8 MG/5ML SUER Take 5 mLs by mouth 2 (two) times daily. 02/25/16   Earleen Newport, MD  clopidogrel (PLAVIX) 75 MG tablet Take 1 tablet (75 mg total) by mouth daily. 09/20/16 09/20/17  Carrie Mew, MD  gabapentin (NEURONTIN) 400 MG capsule Take 800 mg by mouth 3 (three) times daily.  04/19/16   [provider]  glipiZIDE (GLUCOTROL XL) 10 MG 24 hr tablet Take 10 mg by mouth.  03/20/16 01/26/17  [provider]  KLOR-CON M20 20 MEQ tablet Take 20 mEq by mouth once.  10/13/15   [provider]  lamoTRIgine (LAMICTAL) 200 MG tablet Take 1 tablet (200 mg  total) daily by mouth. 12/31/16   Rainey Pines, MD  levothyroxine (SYNTHROID, LEVOTHROID) 200 MCG tablet Take 200 mcg by mouth daily.     [provider]  levothyroxine (SYNTHROID, LEVOTHROID) 25 MCG tablet Take 25 mcg by mouth daily before breakfast.  06/13/16 05/23/17  [provider]  montelukast (SINGULAIR) 10 MG tablet Take 10 mg by mouth daily.     [provider]  naproxen (NAPROSYN) 500 MG tablet Take 1 tablet (500 mg total) by mouth 2 (two) times daily as needed for mild pain or moderate pain. 11/26/16 11/26/17  Orbie Pyo, MD  ondansetron (ZOFRAN ODT) 4 MG disintegrating tablet Take 1 tablet (4 mg total) by mouth every 8 (eight) hours as needed for nausea or vomiting. 02/25/16   Earleen Newport, MD  polyethylene glycol [facility]) packet Take 17 g by mouth daily as needed. 02/27/17   Gregor Hams, MD  QUEtiapine (SEROQUEL) 25 MG tablet Take 1 tablet (25  mg total) 2 (two) times daily by mouth. 12/31/16   Rainey Pines, MD  ranitidine (ZANTAC) 300 MG tablet Take 1 tablet (300 mg total) by mouth 2 (two) times daily. Patient taking differently: Take 300 mg by mouth daily.  11/30/14   Carrie Mew, MD  ranolazine (RANEXA) 500 MG 12 hr tablet Take 1 tablet (500 mg total) by mouth 2 (two) times daily. 09/18/16   Nicholes Mango, MD  tiotropium (SPIRIVA) 18 MCG inhalation capsule Place 18 mcg into inhaler and inhale at bedtime.    [provider]  topiramate (TOPAMAX) 100 MG tablet Take 1 tablet (100 mg total) 2 (two) times daily by mouth. 12/31/16   Rainey Pines, MD    Allergies Diazepam; Sulfa antibiotics; Cephalexin; Ciprofloxacin; Flagyl [metronidazole]; Levofloxacin; Vancomycin; Penicillins; and Pregabalin  Family History  Problem Relation Age of Onset  . Diabetes Mellitus II Mother   . CAD Mother   . Sleep apnea Mother   . Osteoarthritis Mother   . Osteoporosis Mother   . Anxiety disorder Mother   . Depression Mother   . Bipolar  disorder Mother   . Bipolar disorder Father   . Hypertension Father   . Depression Father   . Anxiety disorder Father   . Post-traumatic stress disorder Sister     Social History Social History   Tobacco Use  . Smoking status: Former Smoker    Packs/day: 1.00    Types: Cigarettes    Last attempt to quit: 06/12/2016    Years since quitting: 0.7  . Smokeless tobacco: Never Used  Substance Use Topics  . Alcohol use: No    Alcohol/week: 0.0 oz  . Drug use: No    Review of Systems Constitutional: No fever/chills Eyes: No visual changes. ENT: No sore throat. Cardiovascular: Denies chest pain. Respiratory: Denies shortness of breath. Gastrointestinal: Positive for abdominal pain.  No nausea, no vomiting.  No diarrhea.  No constipation. Genitourinary: Negative for dysuria. Musculoskeletal: Negative for neck pain.  Negative for back pain. Integumentary: Negative for rash. Neurological: Negative for headaches, focal weakness or numbness.   ____________________________________________   PHYSICAL EXAM:  VITAL SIGNS: ED Triage Vitals  Enc Vitals Group     BP 02/26/17 2111 96/84     Pulse Rate 02/26/17 2111 84     Resp 02/26/17 2111 20     Temp 02/26/17 2111 98.2 F (36.8 C)     Temp Source 02/26/17 2111 Oral     SpO2 02/26/17 2111 97 %     Weight 02/26/17 2109 93 kg (205 lb)     Height 02/26/17 2109 1.626 m (5\' 4" )     Head Circumference --      Peak Flow --      Pain Score 02/26/17 2109 10     Pain Loc --      Pain Edu? --      Excl. in [place]? --     Constitutional: Alert and oriented. Well appearing and in no acute distress. Eyes: Conjunctivae are normal. Head: Atraumatic. Mouth/Throat: Mucous membranes are moist. Oropharynx non-erythematous. Neck: No stridor.   Cardiovascular: Normal rate, regular rhythm. Good peripheral circulation. Grossly normal heart sounds. Respiratory: Normal respiratory effort.  No retractions. Lungs CTAB. Gastrointestinal: Right lower  quadrant/paraumbilical discomfort. No distention.  Musculoskeletal: No lower extremity tenderness nor edema. No gross deformities of extremities. Neurologic:  Normal speech and language. No gross focal neurologic deficits are appreciated.  Skin:  Skin is warm, dry and intact. No rash noted. Psychiatric: Mood  and affect are normal. Speech and behavior are normal.  ____________________________________________   LABS (all labs ordered are listed, but only abnormal results are displayed)  Labs Reviewed  LIPASE, BLOOD - Abnormal; Notable for the following components:      Result Value   Lipase 57 (*)    All other components within normal limits  COMPREHENSIVE METABOLIC PANEL - Abnormal; Notable for the following components:   Glucose, Bld 189 (*)    BUN 23 (*)    Creatinine, Ser 1.14 (*)    GFR calc non Af Amer 57 (*)    All other components within normal limits  CBC  TROPONIN I  URINALYSIS, COMPLETE (UACMP) WITH MICROSCOPIC   ____________________________________________  EKG  ED ECG REPORT I, Harlan N Layan Zalenski, the attending physician, personally viewed and interpreted this ECG.   Date: 02/27/2017  EKG Time: 9:14 PM  Rate: 109  Rhythm: Sinus tachycardia  Axis: Normal  Intervals: Normal  ST&T Change: None  ____________________________________________  RADIOLOGY I, [place] N Ronav Furney, personally viewed and evaluated these images (plain radiographs) as part of my medical decision making, as well as reviewing the written report by the radiologist.  Dg Chest 2 View  Result Date: 02/26/2017 CLINICAL DATA:  47 year old female with shortness of breath. EXAM: CHEST  2 VIEW COMPARISON:  Chest radiograph dated 07/04/2016 FINDINGS: Mild diffuse interstitial coarsening and chronic bronchitic changes. No focal consolidation, pleural effusion, or pneumothorax. The cardiac silhouette is within normal limits. No acute osseous pathology. IMPRESSION: 1. No acute cardiopulmonary process. 2.  Chronic bronchitic changes. Electronically Signed   By: Anner Crete M.D.   On: 02/26/2017 21:34   Ct Abdomen Pelvis W Contrast  Result Date: 02/27/2017 CLINICAL DATA:  47 year old female with abdominal pain. EXAM: CT ABDOMEN AND PELVIS WITH CONTRAST TECHNIQUE: Multidetector CT imaging of the abdomen and pelvis was performed using the standard protocol following bolus administration of intravenous contrast. CONTRAST:  58mL ISOVUE-300 IOPAMIDOL (ISOVUE-300) INJECTION 61% COMPARISON:  CT of the abdomen pelvis dated 09/17/2016 FINDINGS: Lower chest: Mild bibasilar interstitial prominence and diffuse hazy airspace densities may represent atelectatic changes or mild edema. Pneumonia is not excluded. Clinical correlation is recommended. No intra-abdominal free air or free fluid. Hepatobiliary: Apparent mild fatty infiltration of the liver. No intrahepatic biliary ductal dilatation. The gallbladder is unremarkable. Pancreas: Unremarkable. No pancreatic ductal dilatation or surrounding inflammatory changes. Spleen: Normal in size without focal abnormality. Adrenals/Urinary Tract: There is a 2 cm hypodense lesion in the lateral limb of the left adrenal gland which is not well characterized on the CT but similar to prior studies and likely an adenoma. The right adrenal gland is unremarkable. A 12 mm exophytic left renal upper pole hypodense lesion again noted which is similar to prior CT most likely a cyst. Further evaluation as previously recommended. There is no hydronephrosis on either side. There is symmetric enhancement and excretion of contrast by both kidneys. The visualized ureters and urinary bladder appear unremarkable. Stomach/Bowel: Mild circumferential thickening of the rectum as seen previously. The rectum is only partially included in this study. No bowel obstruction or active inflammation. Moderate colonic stool burden. There is postsurgical changes of partial sigmoid resection with anastomotic suture.  Normal caliber fecalized loops of small bowel may represent increased transit time or small intestine bacterial overgrowth. Normal appendix. Vascular/Lymphatic: No significant vascular findings are present. No enlarged abdominal or pelvic lymph nodes. Reproductive: Hysterectomy.  No pelvic mass. Other: Midline vertical anterior pelvic wall incisional scar. Small fat containing ventral hernia  to the right of the midline. No fluid collection or inflammatory changes. Musculoskeletal: Osteopenia.  No acute osseous pathology. IMPRESSION: 1. No acute intra-abdominal or pelvic pathology. Mild partially visualized cecum facial thickening of the rectal wall as seen on the prior CT. Moderate colonic stool burden. No bowel obstruction or active inflammation. 2. Left adrenal nodule and a small exophytic low attenuating lesion from the superior pole of the left kidney similar to prior CT. 3. Mild interstitial prominence and airspace hazy densities of the lung bases may represent atelectatic changes or mild edema. Clinical correlation is recommended Electronically Signed   By: Anner Crete M.D.   On: 02/27/2017 03:14     Procedures   ____________________________________________   INITIAL IMPRESSION / ASSESSMENT AND PLAN / ED COURSE  As part of my medical decision making, I reviewed the following data within the electronic MEDICAL RECORD [id number] year old female presented with above-stated history and physical exam of abdominal pain.  Given previous abdominal surgery and periumbilical location also history of hernia CT scan of the abdomen performed to evaluate for possible bowel obstruction versus incarcerated hernia versus other intra-abdominal pathology.  CT scan revealed no acute intra-abdominal pathology moderate stool burden noted on CT.  Regarding stated chest pain in triage however denied back in treatment room troponin negative EKG unremarkable ____________________________________________  FINAL CLINICAL  IMPRESSION(S) / ED DIAGNOSES  Final diagnoses:  Constipation, unspecified constipation type     MEDICATIONS GIVEN DURING THIS VISIT:  Medications  ketorolac (TORADOL) 30 MG/ML injection 30 mg (30 mg Intravenous Given 02/27/17 0225)  ondansetron (ZOFRAN) injection 4 mg (4 mg Intravenous Given 02/27/17 0224)  iopamidol (ISOVUE-300) 61 % injection 75 mL (75 mLs Intravenous Contrast Given 02/27/17 0248)     ED Discharge Orders        Ordered    polyethylene glycol (MIRALAX) packet  Daily PRN     02/27/17 0458       Note:  This document was prepared using Dragon voice recognition software and may include unintentional dictation errors.    Gregor Hams, MD 02/27/17 [phone]

## 2017-02-27 NOTE — ED Notes (Signed)
Patient transported to CT 

## 2017-02-28 DIAGNOSIS — I2510 Atherosclerotic heart disease of native coronary artery without angina pectoris: Secondary | ICD-10-CM | POA: Diagnosis not present

## 2017-03-01 DIAGNOSIS — I2510 Atherosclerotic heart disease of native coronary artery without angina pectoris: Secondary | ICD-10-CM | POA: Diagnosis not present

## 2017-03-02 DIAGNOSIS — I2510 Atherosclerotic heart disease of native coronary artery without angina pectoris: Secondary | ICD-10-CM | POA: Diagnosis not present

## 2017-03-03 DIAGNOSIS — I2510 Atherosclerotic heart disease of native coronary artery without angina pectoris: Secondary | ICD-10-CM | POA: Diagnosis not present

## 2017-03-04 DIAGNOSIS — I2510 Atherosclerotic heart disease of native coronary artery without angina pectoris: Secondary | ICD-10-CM | POA: Diagnosis not present

## 2017-03-05 DIAGNOSIS — I2510 Atherosclerotic heart disease of native coronary artery without angina pectoris: Secondary | ICD-10-CM | POA: Diagnosis not present

## 2017-03-05 DIAGNOSIS — E039 Hypothyroidism, unspecified: Secondary | ICD-10-CM | POA: Diagnosis not present

## 2017-03-05 DIAGNOSIS — E785 Hyperlipidemia, unspecified: Secondary | ICD-10-CM | POA: Diagnosis not present

## 2017-03-05 DIAGNOSIS — J069 Acute upper respiratory infection, unspecified: Secondary | ICD-10-CM | POA: Diagnosis not present

## 2017-03-05 DIAGNOSIS — E1122 Type 2 diabetes mellitus with diabetic chronic kidney disease: Secondary | ICD-10-CM | POA: Diagnosis not present

## 2017-03-06 ENCOUNTER — Encounter: Payer: Self-pay | Admitting: Psychiatry

## 2017-03-06 ENCOUNTER — Ambulatory Visit (INDEPENDENT_AMBULATORY_CARE_PROVIDER_SITE_OTHER): Payer: Medicare HMO | Admitting: Psychiatry

## 2017-03-06 ENCOUNTER — Other Ambulatory Visit: Payer: Self-pay

## 2017-03-06 VITALS — BP 123/87 | HR 106 | Temp 98.4°F | Wt 222.0 lb

## 2017-03-06 DIAGNOSIS — F3160 Bipolar disorder, current episode mixed, unspecified: Secondary | ICD-10-CM

## 2017-03-06 DIAGNOSIS — F17200 Nicotine dependence, unspecified, uncomplicated: Secondary | ICD-10-CM | POA: Diagnosis not present

## 2017-03-06 DIAGNOSIS — G4733 Obstructive sleep apnea (adult) (pediatric): Secondary | ICD-10-CM | POA: Diagnosis not present

## 2017-03-06 DIAGNOSIS — I2510 Atherosclerotic heart disease of native coronary artery without angina pectoris: Secondary | ICD-10-CM | POA: Diagnosis not present

## 2017-03-06 MED ORDER — QUETIAPINE FUMARATE 25 MG PO TABS: 25.0000 mg | ORAL_TABLET | Freq: Two times a day (BID) | ORAL | 0 refills | Status: DC

## 2017-03-06 MED ORDER — LAMOTRIGINE 200 MG PO TABS: 200.0000 mg | ORAL_TABLET | Freq: Every day | ORAL | 0 refills | Status: DC

## 2017-03-06 MED ORDER — BUSPIRONE HCL 15 MG PO TABS: 15.0000 mg | ORAL_TABLET | Freq: Three times a day (TID) | ORAL | 0 refills | Status: DC

## 2017-03-06 NOTE — Progress Notes (Signed)
[facility] Progress Note  03/06/2017 4:07 PM Marie Clarke  MRN:  [id number]  Chief Complaint: ' I am sick ." Chief Complaint    Follow-up; Medication Refill     HPI: Marie Clarke is a 47 year old Caucasian female, single, on SSD, lives in [place], has a history of bipolar disorder, COPD, obstructive sleep apnea on CPAP, coronary artery disease, hyperlipidemia, hypertension , hypothyroidism, chronic back pain, presented to the clinic today for a follow-up visit.  Marie Clarke used to follow up with Dr. Gretel Acre in the past.  This is her first visit with Probation officer.  Marie Clarke reports she feels sick today, she presented wearing mask over her mouth and nose.  She reports she has flu like symptoms.  She also visited the emergency department recently.  She does have COPD.  She reports she is taking symptomatic treatment to manage her symptoms at this time.  She otherwise reports her mood is okay.  She reports she is compliant on her medications as prescribed.  She continues to take Lamictal, BuSpar, Seroquel.  She reports she was taken off of the Topamax by her primary medical doctor.  She is currently on nortriptyline for neurological pain.  She has OSA, she is currently not compliant with her CPAP.  She reports she plans to take her CPAP back to get it fixed soon.  She understands the consequences of not using CPAP properly.  She continues to have some psychosocial stressors of health problems, relational struggles with her siblings.  She reports she was assigned the sole beneficiary of her parents property when the passed.  She reports her siblings have not spoken to her since then.  She reports she has overgrown any anxiety due to the relational issues.  She however continues to think about it sometimes.  She lives with her aunt and uncle.  She reports they are supportive.  She also has an aide who comes to her house daily because of her chronic back pain issues and other medical problems.  She continues to smoke  cigarettes.  She reports she is trying to quit.  She is using nicotine replacement.  Visit Diagnosis:    ICD-10-CM   1. Bipolar I disorder, most recent episode mixed (HCC) F31.60 lamoTRIgine (LAMICTAL) 200 MG tablet    busPIRone (BUSPAR) 15 MG tablet    QUEtiapine (SEROQUEL) 25 MG tablet  2. OSA (obstructive sleep apnea) G47.33   3. Tobacco use disorder F17.200     Past Psychiatric History: History of bipolar disorder.  She reports she had one suicide attempt in 2015.  This happened during the time her father passed away and her siblings stopped talking to her.  She reports 3 inpatient mental health admissions to a hospital in [place].  Past Medical History:  Past Medical History:  Diagnosis Date  . Anemia   . Anxiety   . Asthma   . Bipolar disorder ([place])   . CAD (coronary artery disease) unk  . CHF (congestive heart failure) ([place])   . COPD (chronic obstructive pulmonary disease) ([place])   . Depression unk  . Diabetes mellitus without complication ([place])   . Diabetes mellitus, type II ([place])   . Drug overdose   . GERD (gastroesophageal reflux disease)   . Headache   . Hyperlipidemia   . Hypertension   . Left leg pain 04/29/2014  . MI (myocardial infarction) ([place])   . Muscle ache 09/16/2014  . Osteoporosis   . Overactive bladder   . Pancreatitis unk  . Reflex  sympathetic dystrophy   . Renal insufficiency   . Sleep apnea    pt reported on 2/6/7 she currently is not using CPAP  . Sleep apnea   . Stroke ([place])   . Thyroid disease   . TIA (transient ischemic attack) unk  . TIA (transient ischemic attack)     Past Surgical History:  Procedure Laterality Date  . ABDOMINAL HYSTERECTOMY    . prolapse rectum surgery N/A July 2016  . TONSILLECTOMY      Family Psychiatric History: Mother-bipolar disorder, father-bipolar disorder, anxiety disorder, sister - PTSD  Family History:  Family History  Problem Relation Age of Onset  . Diabetes Mellitus II Mother   . CAD Mother    . Sleep apnea Mother   . Osteoarthritis Mother   . Osteoporosis Mother   . Anxiety disorder Mother   . Depression Mother   . Bipolar disorder Mother   . Bipolar disorder Father   . Hypertension Father   . Depression Father   . Anxiety disorder Father   . Post-traumatic stress disorder Sister     Social History: She is single.  She lives in [place] with her aunt and uncle.  She also has an aide coming to help her on a regular basis due to her multiple medical issues.  She denies having children.  She is on SSD.  She does have siblings but does not have a good relationship with them. Social History   Socioeconomic History  . Marital status: Single    Spouse name: None  . Number of children: 0  . Years of education: None  . Highest education level: High school graduate  Social Needs  . Financial resource strain: Not hard at all  . Food insecurity - worry: Never true  . Food insecurity - inability: Never true  . Transportation needs - medical: Yes  . Transportation needs - non-medical: Yes  Occupational History    Comment: not employed  Tobacco Use  . Smoking status: Former Smoker    Packs/day: 1.00    Types: Cigarettes    Last attempt to quit: 06/12/2016    Years since quitting: 0.7  . Smokeless tobacco: Never Used  Substance and Sexual Activity  . Alcohol use: No    Alcohol/week: 0.0 oz  . Drug use: No  . Sexual activity: Not Currently  Other Topics Concern  . None  Social History Narrative  . None    Allergies:  Allergies  Allergen Reactions  . Diazepam Hives and Nausea And Vomiting  . Sulfa Antibiotics Hives  . Cephalexin Hives  . Ciprofloxacin Hives  . Flagyl [Metronidazole] Hives  . Geodon [Ziprasidone Hcl]     CONFUSED  . Levofloxacin Hives  . Vancomycin Hives  . Penicillins Nausea And Vomiting    Has patient had a PCN reaction causing immediate rash, facial/tongue/throat swelling, SOB or lightheadedness with hypotension: No Has patient had a PCN  reaction causing severe rash involving mucus membranes or skin necrosis: No Has patient had a PCN reaction that required hospitalization: No Has patient had a PCN reaction occurring within the last 10 years: No If all of the above answers are "NO", then may proceed with Cephalosporin use.   . Pregabalin Hives and Rash    Metabolic Disorder Labs: No results found for: HGBA1C, MPG No results found for: PROLACTIN No results found for: CHOL, TRIG, HDL, CHOLHDL, VLDL, LDLCALC No results found for: TSH  Therapeutic Level Labs: No results found for: LITHIUM No results found  for: VALPROATE No components found for:  CBMZ  Current Medications: Current Outpatient Medications  Medication Sig Dispense Refill  . acetaminophen (TYLENOL) 325 MG tablet Take 1 tablet (325 mg total) by mouth every 6 (six) hours as needed for mild pain (or Fever >/= 101).    Marie Clarke Kitchen albuterol (PROVENTIL HFA;VENTOLIN HFA) 108 (90 BASE) MCG/ACT inhaler Inhale 2 puffs into the lungs every 4 (four) hours as needed for wheezing or shortness of breath.    Marie Clarke Kitchen aspirin EC 81 MG tablet Take 81 mg by mouth daily at 12 noon.    Marie Clarke Kitchen atorvastatin (LIPITOR) 40 MG tablet Take 40 mg by mouth at bedtime.     Marie Clarke Kitchen azelastine (ASTELIN) 0.1 % nasal spray Place 1 spray into both nostrils daily.    . bisoprolol (ZEBETA) 10 MG tablet Take 1 tablet (10 mg total) by mouth 2 (two) times daily. 60 tablet 11  . bumetanide (BUMEX) 2 MG tablet Take 1 tablet (2 mg total) by mouth 2 (two) times daily. 60 tablet 11  . busPIRone (BUSPAR) 15 MG tablet Take 1 tablet (15 mg total) by mouth 3 (three) times daily. 270 tablet 0  . chlorpheniramine-HYDROcodone (TUSSIONEX PENNKINETIC ER) 10-8 MG/5ML SUER Take 5 mLs by mouth 2 (two) times daily. 140 mL 0  . clopidogrel (PLAVIX) 75 MG tablet Take 1 tablet (75 mg total) by mouth daily. 30 tablet 0  . gabapentin (NEURONTIN) 400 MG capsule Take 800 mg by mouth 3 (three) times daily.     Marie Clarke Kitchen KLOR-CON M20 20 MEQ tablet Take 20 mEq  by mouth once.     . lamoTRIgine (LAMICTAL) 200 MG tablet Take 1 tablet (200 mg total) by mouth daily. 90 tablet 0  . levothyroxine (SYNTHROID, LEVOTHROID) 200 MCG tablet Take 200 mcg by mouth daily.     Marie Clarke Kitchen levothyroxine (SYNTHROID, LEVOTHROID) 25 MCG tablet Take 25 mcg by mouth daily before breakfast.     . montelukast (SINGULAIR) 10 MG tablet Take 10 mg by mouth daily.     . naproxen (NAPROSYN) 500 MG tablet Take 1 tablet (500 mg total) by mouth 2 (two) times daily as needed for mild pain or moderate pain. 10 tablet 0  . ondansetron (ZOFRAN ODT) 4 MG disintegrating tablet Take 1 tablet (4 mg total) by mouth every 8 (eight) hours as needed for nausea or vomiting. 20 tablet 0  . polyethylene glycol (MIRALAX) packet Take 17 g by mouth daily as needed. 14 each 0  . QUEtiapine (SEROQUEL) 25 MG tablet Take 1 tablet (25 mg total) by mouth 2 (two) times daily. 180 tablet 0  . ranitidine (ZANTAC) 300 MG tablet Take 1 tablet (300 mg total) by mouth 2 (two) times daily. (Patient taking differently: Take 300 mg by mouth daily. ) 60 tablet 0  . ranolazine (RANEXA) 500 MG 12 hr tablet Take 1 tablet (500 mg total) by mouth 2 (two) times daily. 60 tablet 0  . tiotropium (SPIRIVA) 18 MCG inhalation capsule Place 18 mcg into inhaler and inhale at bedtime.    Marie Clarke Kitchen glipiZIDE (GLUCOTROL XL) 10 MG 24 hr tablet Take 10 mg by mouth.      No current facility-administered medications for this visit.      Musculoskeletal: Strength & Muscle Tone: within normal limits Gait & Station: normal Patient leans: N/A  Psychiatric Specialty Exam: Review of Systems  HENT: Positive for congestion.   Respiratory: Positive for cough and shortness of breath.   Psychiatric/Behavioral: The patient is nervous/anxious.   All other systems  reviewed and are negative.   Blood pressure 123/87, pulse (!) 106, temperature 98.4 F (36.9 C), temperature source Oral, weight 222 lb (100.7 kg).Body mass index is 38.11 kg/m.  General  Appearance: Casual  Eye Contact:  Fair  Speech:  Clear and Coherent  Volume:  Normal  Mood:  Anxious  Affect:  Congruent  Thought Process:  Goal Directed and Descriptions of Associations: Intact  Orientation:  Full (Time, Place, and Person)  Thought Content: Logical   Suicidal Thoughts:  No  Homicidal Thoughts:  No  Memory:  Immediate;   Fair Recent;   Fair Remote;   Fair  Judgement:  Fair  Insight:  Fair  Psychomotor Activity:  Normal  Concentration:  Concentration: Fair and Attention Span: Fair  Recall:  [employer] of Knowledge: Fair  Language: Fair  Akathisia:  No  Handed:  Right  AIMS (if indicated): Denies tremors, rigidity, stiffness  Assets:  Communication Skills Desire for Improvement Housing Social Support  ADL's:  Intact  Cognition: WNL  Sleep:  Fair   Screenings: PHQ2-9     Office Visit from 03/28/2015 in [facility]  PHQ-2 Total Score  0       Assessment and Plan: Cedrica is a 47 year old Caucasian female who has a history of bipolar disorder as well as multiple medical problems, presented to the clinic today for a follow-up visit.  Evony currently has some respiratory tract infection, possibly an exacerbation of her COPD.  She reports otherwise doing well.  She wants to stay on the current medication as noted below.  Plan as noted below.  Plan  For bipolar disorder Continue lamotrigine 200 mg p.o. daily Continue BuSpar 15 mg p.o. 3 times daily Continue Seroquel 25 mg p.o. BID [place] - 0  Tobacco use disorder Provided smoking cessation counseling She is using nicotine replacement.  For OSA, noncompliant with CPAP She reports she is going to take her CPAP to get it fixed.  Provided education  Primary medical doctor is managing  her respiratory infection.  Follow-up in 3 months or sooner if needed.  More than 50 % of the time was spent for psychoeducation and supportive psychotherapy and care  coordination.  This note was generated in part or whole with voice recognition software. Voice recognition is usually quite accurate but there are transcription errors that can and very often do occur. I apologize for any typographical errors that were not detected and corrected.       Ursula Alert, MD 03/06/2017, 4:07 PM

## 2017-03-07 DIAGNOSIS — I2510 Atherosclerotic heart disease of native coronary artery without angina pectoris: Secondary | ICD-10-CM | POA: Diagnosis not present

## 2017-03-08 DIAGNOSIS — I2510 Atherosclerotic heart disease of native coronary artery without angina pectoris: Secondary | ICD-10-CM | POA: Diagnosis not present

## 2017-03-09 DIAGNOSIS — I2510 Atherosclerotic heart disease of native coronary artery without angina pectoris: Secondary | ICD-10-CM | POA: Diagnosis not present

## 2017-03-10 DIAGNOSIS — I2510 Atherosclerotic heart disease of native coronary artery without angina pectoris: Secondary | ICD-10-CM | POA: Diagnosis not present

## 2017-03-11 ENCOUNTER — Ambulatory Visit: Payer: Medicaid Other | Admitting: Psychiatry

## 2017-03-11 DIAGNOSIS — G4733 Obstructive sleep apnea (adult) (pediatric): Secondary | ICD-10-CM | POA: Diagnosis not present

## 2017-03-11 DIAGNOSIS — I2510 Atherosclerotic heart disease of native coronary artery without angina pectoris: Secondary | ICD-10-CM | POA: Diagnosis not present

## 2017-03-12 DIAGNOSIS — J40 Bronchitis, not specified as acute or chronic: Secondary | ICD-10-CM | POA: Diagnosis not present

## 2017-03-12 DIAGNOSIS — I2510 Atherosclerotic heart disease of native coronary artery without angina pectoris: Secondary | ICD-10-CM | POA: Diagnosis not present

## 2017-03-13 DIAGNOSIS — R490 Dysphonia: Secondary | ICD-10-CM | POA: Diagnosis not present

## 2017-03-13 DIAGNOSIS — E669 Obesity, unspecified: Secondary | ICD-10-CM | POA: Diagnosis not present

## 2017-03-13 DIAGNOSIS — I2510 Atherosclerotic heart disease of native coronary artery without angina pectoris: Secondary | ICD-10-CM | POA: Diagnosis not present

## 2017-03-13 DIAGNOSIS — G4733 Obstructive sleep apnea (adult) (pediatric): Secondary | ICD-10-CM | POA: Diagnosis not present

## 2017-03-14 DIAGNOSIS — I2510 Atherosclerotic heart disease of native coronary artery without angina pectoris: Secondary | ICD-10-CM | POA: Diagnosis not present

## 2017-03-15 DIAGNOSIS — I2510 Atherosclerotic heart disease of native coronary artery without angina pectoris: Secondary | ICD-10-CM | POA: Diagnosis not present

## 2017-03-15 DIAGNOSIS — G4733 Obstructive sleep apnea (adult) (pediatric): Secondary | ICD-10-CM | POA: Diagnosis not present

## 2017-03-16 DIAGNOSIS — I2510 Atherosclerotic heart disease of native coronary artery without angina pectoris: Secondary | ICD-10-CM | POA: Diagnosis not present

## 2017-03-17 DIAGNOSIS — I2510 Atherosclerotic heart disease of native coronary artery without angina pectoris: Secondary | ICD-10-CM | POA: Diagnosis not present

## 2017-03-18 DIAGNOSIS — I2510 Atherosclerotic heart disease of native coronary artery without angina pectoris: Secondary | ICD-10-CM | POA: Diagnosis not present

## 2017-03-19 ENCOUNTER — Telehealth: Payer: Self-pay | Admitting: Psychiatry

## 2017-03-19 ENCOUNTER — Other Ambulatory Visit: Payer: Self-pay | Admitting: Psychiatry

## 2017-03-19 DIAGNOSIS — I2510 Atherosclerotic heart disease of native coronary artery without angina pectoris: Secondary | ICD-10-CM | POA: Diagnosis not present

## 2017-03-19 DIAGNOSIS — F17200 Nicotine dependence, unspecified, uncomplicated: Secondary | ICD-10-CM

## 2017-03-19 DIAGNOSIS — F319 Bipolar disorder, unspecified: Secondary | ICD-10-CM

## 2017-03-19 MED ORDER — VARENICLINE TARTRATE 0.5 MG PO TABS: 0.5000 mg | ORAL_TABLET | Freq: Two times a day (BID) | ORAL | 1 refills | Status: DC

## 2017-03-19 NOTE — Telephone Encounter (Signed)
Attempted to call patient , not available .

## 2017-03-19 NOTE — Telephone Encounter (Signed)
Patient returned writer's call.  Discussed starting Chantix with patient.  Patient aware of risk, interaction of medication as well as cardiovascular effect given her history of coronary artery disease.  Patient will keep a close monitor of her symptoms and will stop the medication if she has side effects.  Chantix prescription sent to her [facility] as discussed.

## 2017-03-20 DIAGNOSIS — I2510 Atherosclerotic heart disease of native coronary artery without angina pectoris: Secondary | ICD-10-CM | POA: Diagnosis not present

## 2017-03-21 DIAGNOSIS — I2510 Atherosclerotic heart disease of native coronary artery without angina pectoris: Secondary | ICD-10-CM | POA: Diagnosis not present

## 2017-03-22 DIAGNOSIS — I2510 Atherosclerotic heart disease of native coronary artery without angina pectoris: Secondary | ICD-10-CM | POA: Diagnosis not present

## 2017-03-23 DIAGNOSIS — I2510 Atherosclerotic heart disease of native coronary artery without angina pectoris: Secondary | ICD-10-CM | POA: Diagnosis not present

## 2017-03-24 DIAGNOSIS — I2510 Atherosclerotic heart disease of native coronary artery without angina pectoris: Secondary | ICD-10-CM | POA: Diagnosis not present

## 2017-03-25 DIAGNOSIS — I2510 Atherosclerotic heart disease of native coronary artery without angina pectoris: Secondary | ICD-10-CM | POA: Diagnosis not present

## 2017-03-26 DIAGNOSIS — Z760 Encounter for issue of repeat prescription: Secondary | ICD-10-CM | POA: Diagnosis not present

## 2017-03-26 DIAGNOSIS — E039 Hypothyroidism, unspecified: Secondary | ICD-10-CM | POA: Diagnosis not present

## 2017-03-26 DIAGNOSIS — I10 Essential (primary) hypertension: Secondary | ICD-10-CM | POA: Diagnosis not present

## 2017-03-26 DIAGNOSIS — I2510 Atherosclerotic heart disease of native coronary artery without angina pectoris: Secondary | ICD-10-CM | POA: Diagnosis not present

## 2017-03-26 DIAGNOSIS — E1121 Type 2 diabetes mellitus with diabetic nephropathy: Secondary | ICD-10-CM | POA: Diagnosis not present

## 2017-03-26 DIAGNOSIS — E785 Hyperlipidemia, unspecified: Secondary | ICD-10-CM | POA: Diagnosis not present

## 2017-03-26 DIAGNOSIS — F319 Bipolar disorder, unspecified: Secondary | ICD-10-CM | POA: Diagnosis not present

## 2017-03-26 DIAGNOSIS — G894 Chronic pain syndrome: Secondary | ICD-10-CM | POA: Diagnosis not present

## 2017-03-27 DIAGNOSIS — I2510 Atherosclerotic heart disease of native coronary artery without angina pectoris: Secondary | ICD-10-CM | POA: Diagnosis not present

## 2017-03-28 DIAGNOSIS — I2510 Atherosclerotic heart disease of native coronary artery without angina pectoris: Secondary | ICD-10-CM | POA: Diagnosis not present

## 2017-03-29 DIAGNOSIS — I2510 Atherosclerotic heart disease of native coronary artery without angina pectoris: Secondary | ICD-10-CM | POA: Diagnosis not present

## 2017-03-30 DIAGNOSIS — I2510 Atherosclerotic heart disease of native coronary artery without angina pectoris: Secondary | ICD-10-CM | POA: Diagnosis not present

## 2017-03-31 DIAGNOSIS — I2510 Atherosclerotic heart disease of native coronary artery without angina pectoris: Secondary | ICD-10-CM | POA: Diagnosis not present

## 2017-04-01 DIAGNOSIS — I2510 Atherosclerotic heart disease of native coronary artery without angina pectoris: Secondary | ICD-10-CM | POA: Diagnosis not present

## 2017-04-02 DIAGNOSIS — I2510 Atherosclerotic heart disease of native coronary artery without angina pectoris: Secondary | ICD-10-CM | POA: Diagnosis not present

## 2017-04-03 DIAGNOSIS — Z0100 Encounter for examination of eyes and vision without abnormal findings: Secondary | ICD-10-CM | POA: Diagnosis not present

## 2017-04-03 DIAGNOSIS — H5213 Myopia, bilateral: Secondary | ICD-10-CM | POA: Diagnosis not present

## 2017-04-03 DIAGNOSIS — M8580 Other specified disorders of bone density and structure, unspecified site: Secondary | ICD-10-CM | POA: Insufficient documentation

## 2017-04-03 DIAGNOSIS — E119 Type 2 diabetes mellitus without complications: Secondary | ICD-10-CM | POA: Diagnosis not present

## 2017-04-03 DIAGNOSIS — I2510 Atherosclerotic heart disease of native coronary artery without angina pectoris: Secondary | ICD-10-CM | POA: Diagnosis not present

## 2017-04-04 DIAGNOSIS — I2510 Atherosclerotic heart disease of native coronary artery without angina pectoris: Secondary | ICD-10-CM | POA: Diagnosis not present

## 2017-04-05 DIAGNOSIS — I2510 Atherosclerotic heart disease of native coronary artery without angina pectoris: Secondary | ICD-10-CM | POA: Diagnosis not present

## 2017-04-06 DIAGNOSIS — I2510 Atherosclerotic heart disease of native coronary artery without angina pectoris: Secondary | ICD-10-CM | POA: Diagnosis not present

## 2017-04-07 DIAGNOSIS — I2510 Atherosclerotic heart disease of native coronary artery without angina pectoris: Secondary | ICD-10-CM | POA: Diagnosis not present

## 2017-04-08 DIAGNOSIS — I2510 Atherosclerotic heart disease of native coronary artery without angina pectoris: Secondary | ICD-10-CM | POA: Diagnosis not present

## 2017-04-09 DIAGNOSIS — N3941 Urge incontinence: Secondary | ICD-10-CM | POA: Diagnosis not present

## 2017-04-09 DIAGNOSIS — I2510 Atherosclerotic heart disease of native coronary artery without angina pectoris: Secondary | ICD-10-CM | POA: Diagnosis not present

## 2017-04-10 DIAGNOSIS — I2510 Atherosclerotic heart disease of native coronary artery without angina pectoris: Secondary | ICD-10-CM | POA: Diagnosis not present

## 2017-04-11 DIAGNOSIS — I2510 Atherosclerotic heart disease of native coronary artery without angina pectoris: Secondary | ICD-10-CM | POA: Diagnosis not present

## 2017-04-12 DIAGNOSIS — I2510 Atherosclerotic heart disease of native coronary artery without angina pectoris: Secondary | ICD-10-CM | POA: Diagnosis not present

## 2017-04-12 DIAGNOSIS — G4733 Obstructive sleep apnea (adult) (pediatric): Secondary | ICD-10-CM | POA: Diagnosis not present

## 2017-04-13 DIAGNOSIS — I2510 Atherosclerotic heart disease of native coronary artery without angina pectoris: Secondary | ICD-10-CM | POA: Diagnosis not present

## 2017-04-14 ENCOUNTER — Other Ambulatory Visit: Payer: Self-pay

## 2017-04-14 ENCOUNTER — Inpatient Hospital Stay
Admission: EM | Admit: 2017-04-14 | Discharge: 2017-04-16 | DRG: 871 | Disposition: A | Discharge status: Home / Self Care | Payer: Medicare HMO | Attending: Internal Medicine | Admitting: Internal Medicine

## 2017-04-14 ENCOUNTER — Emergency Department: Payer: Medicare HMO

## 2017-04-14 DIAGNOSIS — A419 Sepsis, unspecified organism: Principal | ICD-10-CM | POA: Diagnosis present

## 2017-04-14 DIAGNOSIS — R0602 Shortness of breath: Secondary | ICD-10-CM | POA: Diagnosis not present

## 2017-04-14 DIAGNOSIS — J440 Chronic obstructive pulmonary disease with acute lower respiratory infection: Secondary | ICD-10-CM | POA: Diagnosis not present

## 2017-04-14 DIAGNOSIS — Z7902 Long term (current) use of antithrombotics/antiplatelets: Secondary | ICD-10-CM

## 2017-04-14 DIAGNOSIS — J101 Influenza due to other identified influenza virus with other respiratory manifestations: Secondary | ICD-10-CM | POA: Diagnosis not present

## 2017-04-14 DIAGNOSIS — Z7984 Long term (current) use of oral hypoglycemic drugs: Secondary | ICD-10-CM

## 2017-04-14 DIAGNOSIS — F419 Anxiety disorder, unspecified: Secondary | ICD-10-CM | POA: Diagnosis present

## 2017-04-14 DIAGNOSIS — Z79899 Other long term (current) drug therapy: Secondary | ICD-10-CM

## 2017-04-14 DIAGNOSIS — R05 Cough: Secondary | ICD-10-CM | POA: Diagnosis not present

## 2017-04-14 DIAGNOSIS — M810 Age-related osteoporosis without current pathological fracture: Secondary | ICD-10-CM | POA: Diagnosis present

## 2017-04-14 DIAGNOSIS — I2510 Atherosclerotic heart disease of native coronary artery without angina pectoris: Secondary | ICD-10-CM | POA: Diagnosis not present

## 2017-04-14 DIAGNOSIS — K219 Gastro-esophageal reflux disease without esophagitis: Secondary | ICD-10-CM | POA: Diagnosis present

## 2017-04-14 DIAGNOSIS — Z7982 Long term (current) use of aspirin: Secondary | ICD-10-CM | POA: Diagnosis not present

## 2017-04-14 DIAGNOSIS — G9050 Complex regional pain syndrome I, unspecified: Secondary | ICD-10-CM | POA: Diagnosis present

## 2017-04-14 DIAGNOSIS — I110 Hypertensive heart disease with heart failure: Secondary | ICD-10-CM | POA: Diagnosis not present

## 2017-04-14 DIAGNOSIS — J1000 Influenza due to other identified influenza virus with unspecified type of pneumonia: Secondary | ICD-10-CM | POA: Diagnosis present

## 2017-04-14 DIAGNOSIS — E785 Hyperlipidemia, unspecified: Secondary | ICD-10-CM | POA: Diagnosis present

## 2017-04-14 DIAGNOSIS — J9601 Acute respiratory failure with hypoxia: Secondary | ICD-10-CM | POA: Diagnosis not present

## 2017-04-14 DIAGNOSIS — Z8673 Personal history of transient ischemic attack (TIA), and cerebral infarction without residual deficits: Secondary | ICD-10-CM

## 2017-04-14 DIAGNOSIS — I509 Heart failure, unspecified: Secondary | ICD-10-CM | POA: Diagnosis not present

## 2017-04-14 DIAGNOSIS — E119 Type 2 diabetes mellitus without complications: Secondary | ICD-10-CM | POA: Diagnosis present

## 2017-04-14 DIAGNOSIS — Z87891 Personal history of nicotine dependence: Secondary | ICD-10-CM

## 2017-04-14 DIAGNOSIS — F319 Bipolar disorder, unspecified: Secondary | ICD-10-CM | POA: Diagnosis not present

## 2017-04-14 DIAGNOSIS — I252 Old myocardial infarction: Secondary | ICD-10-CM | POA: Diagnosis not present

## 2017-04-14 DIAGNOSIS — G4730 Sleep apnea, unspecified: Secondary | ICD-10-CM | POA: Diagnosis present

## 2017-04-14 DIAGNOSIS — Z66 Do not resuscitate: Secondary | ICD-10-CM | POA: Diagnosis present

## 2017-04-14 DIAGNOSIS — R509 Fever, unspecified: Secondary | ICD-10-CM | POA: Diagnosis not present

## 2017-04-14 DIAGNOSIS — G4733 Obstructive sleep apnea (adult) (pediatric): Secondary | ICD-10-CM | POA: Diagnosis not present

## 2017-04-14 DIAGNOSIS — F17200 Nicotine dependence, unspecified, uncomplicated: Secondary | ICD-10-CM | POA: Diagnosis not present

## 2017-04-14 DIAGNOSIS — J189 Pneumonia, unspecified organism: Secondary | ICD-10-CM | POA: Diagnosis not present

## 2017-04-14 LAB — CBC WITH DIFFERENTIAL/PLATELET
BASOS ABS: 0 10*3/uL (ref 0–0.1)
BASOS PCT: 0 %
EOS ABS: 0 10*3/uL (ref 0–0.7)
Eosinophils Relative: 0 %
HCT: 33 % — ABNORMAL LOW (ref 35.0–47.0)
HEMOGLOBIN: 11 g/dL — AB (ref 12.0–16.0)
Lymphocytes Relative: 8 %
Lymphs Abs: 0.6 10*3/uL — ABNORMAL LOW (ref 1.0–3.6)
MCH: 30.9 pg (ref 26.0–34.0)
MCHC: 33.3 g/dL (ref 32.0–36.0)
MCV: 92.6 fL (ref 80.0–100.0)
MONO ABS: 0.3 10*3/uL (ref 0.2–0.9)
Monocytes Relative: 4 %
NEUTROS PCT: 88 %
Neutro Abs: 6.5 10*3/uL (ref 1.4–6.5)
Platelets: 199 10*3/uL (ref 150–440)
RBC: 3.56 MIL/uL — ABNORMAL LOW (ref 3.80–5.20)
RDW: 16.4 % — AB (ref 11.5–14.5)
WBC: 7.4 10*3/uL (ref 3.6–11.0)

## 2017-04-14 LAB — COMPREHENSIVE METABOLIC PANEL
ALK PHOS: 97 U/L (ref 38–126)
ALT: 42 U/L (ref 14–54)
ANION GAP: 8 (ref 5–15)
AST: 45 U/L — ABNORMAL HIGH (ref 15–41)
Albumin: 3.7 g/dL (ref 3.5–5.0)
BILIRUBIN TOTAL: 0.7 mg/dL (ref 0.3–1.2)
BUN: 18 mg/dL (ref 6–20)
CALCIUM: 8.2 mg/dL — AB (ref 8.9–10.3)
CO2: 21 mmol/L — ABNORMAL LOW (ref 22–32)
CREATININE: 0.92 mg/dL (ref 0.44–1.00)
Chloride: 109 mmol/L (ref 101–111)
GFR calc non Af Amer: >60 mL/min (ref >60)
Glucose, Bld: 190 mg/dL — ABNORMAL HIGH (ref 65–99)
Potassium: 4.2 mmol/L (ref 3.5–5.1)
SODIUM: 138 mmol/L (ref 135–145)
TOTAL PROTEIN: 7 g/dL (ref 6.5–8.1)

## 2017-04-14 LAB — INFLUENZA PANEL BY PCR (TYPE A & B)
INFLAPCR: POSITIVE — AB
INFLBPCR: NEGATIVE

## 2017-04-14 LAB — LACTIC ACID, PLASMA: Lactic Acid, Venous: 1.3 mmol/L (ref 0.5–1.9)

## 2017-04-14 MED ORDER — SODIUM CHLORIDE 0.9 % IV SOLN
500.0000 mg | Freq: Once | INTRAVENOUS | Status: AC
  Administered 2017-04-14: 500 mg via INTRAVENOUS
  Filled 2017-04-14: qty 500

## 2017-04-14 MED ORDER — SODIUM CHLORIDE 0.9 % IV SOLN
1.0000 g | Freq: Once | INTRAVENOUS | Status: AC
  Administered 2017-04-14: 1 g via INTRAVENOUS
  Filled 2017-04-14: qty 10

## 2017-04-14 MED ORDER — OSELTAMIVIR PHOSPHATE 75 MG PO CAPS
75.0000 mg | ORAL_CAPSULE | Freq: Two times a day (BID) | ORAL | Status: DC
Start: 2017-04-14 — End: 2017-04-16
  Administered 2017-04-14 – 2017-04-16 (×4): 75 mg via ORAL
  Filled 2017-04-14 (×7): qty 1

## 2017-04-14 MED ORDER — SODIUM CHLORIDE 0.9 % IV SOLN
1000.0000 mL | Freq: Once | INTRAVENOUS | Status: AC
  Administered 2017-04-14: 1000 mL via INTRAVENOUS

## 2017-04-14 MED ORDER — IBUPROFEN 800 MG PO TABS
800.0000 mg | ORAL_TABLET | Freq: Once | ORAL | Status: AC
  Administered 2017-04-14: 800 mg via ORAL
  Filled 2017-04-14: qty 1

## 2017-04-14 NOTE — ED Notes (Signed)
IV attempt to right AC unsuccessful; able to draw blood but was unable to thread completely and flush;

## 2017-04-14 NOTE — ED Provider Notes (Signed)
[facility] Emergency Department Provider Note   ____________________________________________    I have reviewed the triage vital signs and the nursing notes.   HISTORY  Chief Complaint Cough; Fever; and Generalized Body Aches     HPI Marie Clarke is a 47 y.o. female who presents with complaints of cough fever and generalized body aches, patient reports symptoms started yesterday evening and this morning.  She complains of diffuse cramping with productive cough.  Positive fever.  No sick contacts reported.  Mild nausea no vomiting, no diarrhea no abdominal pain.  No recent travel.  Has not taken anything for this.  Complains of mild shortness of breath.  No pleurisy  Past Medical History:  Diagnosis Date  . Anemia   . Anxiety   . Asthma   . Bipolar disorder ([place])   . CAD (coronary artery disease) unk  . CHF (congestive heart failure) ([place])   . COPD (chronic obstructive pulmonary disease) ([place])   . Depression unk  . Diabetes mellitus without complication ([place])   . Diabetes mellitus, type II ([place])   . Drug overdose   . GERD (gastroesophageal reflux disease)   . Headache   . Hyperlipidemia   . Hypertension   . Left leg pain 04/29/2014  . MI (myocardial infarction) ([place])   . Muscle ache 09/16/2014  . Osteoporosis   . Overactive bladder   . Pancreatitis unk  . Reflex sympathetic dystrophy   . Renal insufficiency   . Sleep apnea    pt reported on 2/6/7 she currently is not using CPAP  . Sleep apnea   . Stroke ([place])   . Thyroid disease   . TIA (transient ischemic attack) unk  . TIA (transient ischemic attack)     Patient Active Problem List   Diagnosis Date Noted  . Hypotension 09/17/2016  . Contusion of knee, left 10/12/2015  . Sprain of left ankle 10/12/2015  . Strain of left knee 10/12/2015  . Ankle instability 07/20/2015  . Incidental lung nodule 04/28/2015  . Chronic pain 03/28/2015  . Chronic low back pain (Location of Primary  Source of Pain) (Bilateral) (L>R) 03/28/2015  . Chronic lower extremity pain (Left) 03/28/2015  . Abdominal wound dehiscence 03/28/2015  . Encounter for pain management planning 03/28/2015  . Morbid obesity ([place]) 03/28/2015  . Abnormal CT scan, lumbar spine 03/28/2015  . Lumbar facet hypertrophy 03/28/2015  . Lumbar facet syndrome (Location of Primary Source of Pain) (Bilateral) (L>R) 03/28/2015  . Lumbar foraminal stenosis (Bilateral) (L5-S1) 03/28/2015  . Chronic ankle pain (Left) 03/28/2015  . CRPS (complex regional pain syndrome) type I of lower limb (left ankle) 03/28/2015  . Neurogenic pain 03/28/2015  . Neuropathic pain 03/28/2015  . Myofascial pain 03/28/2015  . History of suicide attempt 03/28/2015  . 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 Abnormal gait 12/15/2014  . Congestive heart failure ([place]) 11/15/2014  . Abdominal wall abscess 09/20/2014  . 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 Diabetes mellitus, type 2 ([place]) 08/13/2014  . Bipolar affective disorder ([place]) 08/13/2014  . Type 2 diabetes mellitus ([place]) 08/13/2014  . Rectal prolapse 08/09/2014  . Rectal bleeding 08/09/2014  . Rectal bleed 08/09/2014  . Affective bipolar disorder ([place]) 08/05/2014  . Arteriosclerosis of coronary artery 08/05/2014  . CCF (congestive cardiac failure) ([place]) 08/05/2014  . Chronic kidney disease 08/05/2014  . 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 Apnea, sleep 08/05/2014  . Temporary cerebral vascular dysfunction 08/05/2014  . Polypharmacy 04/29/2014  . Other long term (current) drug  therapy 04/29/2014  . 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 Chronic kidney disease, stage III (moderate) ([place]) 12/14/2013  . Controlled diabetes mellitus type II without complication ([place]) 02/72/5366  . Essential (primary) hypertension 12/03/2013  . Adult hypothyroidism 12/03/2013  . Controlled type 2 diabetes mellitus without complication ([place]) 44/04/4740    Past Surgical History:  Procedure Laterality  Date  . ABDOMINAL HYSTERECTOMY    . prolapse rectum surgery N/A July 2016  . TONSILLECTOMY      Prior to Admission medications   Medication Sig Start Date End Date Taking? Authorizing Provider  acetaminophen (TYLENOL) 325 MG tablet Take 1 tablet (325 mg total) by mouth every 6 (six) hours as needed for mild pain (or Fever >/= 101). 09/18/16  Yes Gouru, Aruna, MD  albuterol (PROVENTIL HFA;VENTOLIN HFA) 108 (90 BASE) MCG/ACT inhaler Inhale 2 puffs into the lungs every 4 (four) hours as needed for wheezing or shortness of breath.   Yes [provider]  aspirin EC 81 MG tablet Take 81 mg by mouth daily at 12 noon.   Yes [provider]  atorvastatin (LIPITOR) 40 MG tablet Take 40 mg by mouth at bedtime.    Yes [provider]  bumetanide (BUMEX) 2 MG tablet Take 1 tablet (2 mg total) by mouth 2 (two) times daily. 09/20/16 09/20/17 Yes Gouru, Aruna, MD  busPIRone (BUSPAR) 15 MG tablet Take 1 tablet (15 mg total) by mouth 3 (three) times daily. 03/06/17  Yes Ursula Alert, MD  gabapentin (NEURONTIN) 600 MG tablet Take 1,200 mg by mouth 3 (three) times daily.  04/19/16  Yes [provider]  ibuprofen (ADVIL,MOTRIN) 800 MG tablet Take 800 mg by mouth every 8 (eight) hours as needed.   Yes [provider]  KLOR-CON M20 20 MEQ tablet Take 20 mEq by mouth daily.  10/13/15  Yes [provider]  lamoTRIgine (LAMICTAL) 200 MG tablet Take 1 tablet (200 mg total) by mouth daily. 03/06/17  Yes Eappen, Ria Clock, MD  levothyroxine (SYNTHROID, LEVOTHROID) 200 MCG tablet Take 200 mcg by mouth daily.    Yes [provider]  levothyroxine (SYNTHROID, LEVOTHROID) 25 MCG tablet Take 25 mcg by mouth daily before breakfast.  06/13/16 05/23/17 Yes [provider]  montelukast (SINGULAIR) 10 MG tablet Take 10 mg by mouth daily.    Yes [provider]  ondansetron (ZOFRAN ODT) 4 MG disintegrating tablet Take 1 tablet (4 mg total) by mouth every 8 (eight)  hours as needed for nausea or vomiting. 02/25/16  Yes Earleen Newport, MD  QUEtiapine (SEROQUEL) 25 MG tablet Take 1 tablet (25 mg total) by mouth 2 (two) times daily. 03/06/17  Yes Ursula Alert, MD  ranitidine (ZANTAC) 300 MG tablet Take 1 tablet (300 mg total) by mouth 2 (two) times daily. 11/30/14  Yes Carrie Mew, MD  tiotropium (SPIRIVA) 18 MCG inhalation capsule Place 18 mcg into inhaler and inhale at bedtime.   Yes [provider]  azelastine (ASTELIN) 0.1 % nasal spray Place 1 spray into both nostrils daily.    [provider]  bisoprolol (ZEBETA) 10 MG tablet Take 1 tablet (10 mg total) by mouth 2 (two) times daily. Patient not taking: Reported on 04/14/2017 09/19/16 09/19/17  Nicholes Mango, MD  chlorpheniramine-HYDROcodone [facility] PENNKINETIC ER) 10-8 MG/5ML SUER Take 5 mLs by mouth 2 (two) times daily. Patient not taking: Reported on 04/14/2017 02/25/16   Earleen Newport, MD  clopidogrel (PLAVIX) 75 MG tablet Take 1 tablet (75 mg total) by mouth daily. Patient  not taking: Reported on 04/14/2017 09/20/16 09/20/17  Carrie Mew, MD  glipiZIDE (GLUCOTROL XL) 10 MG 24 hr tablet Take 10 mg by mouth.  03/20/16 01/26/17  [provider]  naproxen (NAPROSYN) 500 MG tablet Take 1 tablet (500 mg total) by mouth 2 (two) times daily as needed for mild pain or moderate pain. Patient not taking: Reported on 04/14/2017 11/26/16 11/26/17  Orbie Pyo, MD  polyethylene glycol [facility]) packet Take 17 g by mouth daily as needed. Patient not taking: Reported on 04/14/2017 02/27/17   Gregor Hams, MD  ranolazine (RANEXA) 500 MG 12 hr tablet Take 1 tablet (500 mg total) by mouth 2 (two) times daily. Patient not taking: Reported on 04/14/2017 09/18/16   Nicholes Mango, MD  varenicline (CHANTIX) 0.5 MG tablet Take 1 tablet (0.5 mg total) by mouth 2 (two) times daily. Patient not taking: Reported on 04/14/2017 03/19/17   Ursula Alert, MD     Allergies Diazepam; Sulfa  antibiotics; Cephalexin; Ciprofloxacin; Flagyl [metronidazole]; Geodon [ziprasidone hcl]; Levofloxacin; Vancomycin; Penicillins; and Pregabalin  Family History  Problem Relation Age of Onset  . Diabetes Mellitus II Mother   . CAD Mother   . Sleep apnea Mother   . Osteoarthritis Mother   . Osteoporosis Mother   . Anxiety disorder Mother   . Depression Mother   . Bipolar disorder Mother   . Bipolar disorder Father   . Hypertension Father   . Depression Father   . Anxiety disorder Father   . Post-traumatic stress disorder Sister     Social History Social History   Tobacco Use  . Smoking status: Former Smoker    Packs/day: 1.00    Types: Cigarettes    Last attempt to quit: 06/12/2016    Years since quitting: 0.8  . Smokeless tobacco: Never Used  Substance Use Topics  . Alcohol use: No    Alcohol/week: 0.0 oz  . Drug use: No    Review of Systems  Constitutional: Positive fever Eyes: No visual changes.  ENT: No sore throat. Cardiovascular: Denies chest pain. Respiratory: Cough and shortness of breath as above Gastrointestinal: No abdominal pain.  No nausea, no vomiting.   Genitourinary: Negative for dysuria. Musculoskeletal: Myalgias Skin: Negative for rash. Neurological: Negative for headaches   ____________________________________________   PHYSICAL EXAM:  VITAL SIGNS: ED Triage Vitals  Enc Vitals Group     BP 04/14/17 2113 134/71     Pulse Rate 04/14/17 2113 (!) 143     Resp 04/14/17 2113 (!) 24     Temp 04/14/17 2113 (!) 103 F (39.4 C)     Temp Source 04/14/17 2113 Oral     SpO2 04/14/17 2113 91 %     Weight --      Height 04/14/17 2118 1.626 m (5\' 4" )     Head Circumference --      Peak Flow --      Pain Score 04/14/17 2118 9     Pain Loc --      Pain Edu? --      Excl. in [place]? --     Constitutional: Alert and oriented. Eyes: Conjunctivae are normal.   Nose: No congestion/rhinnorhea. Mouth/Throat: Mucous membranes are moist.     Cardiovascular: Tachycardia, regular rhythm. Grossly normal heart sounds.  Good peripheral circulation. Respiratory: Normal respiratory effort.  No retractions.  Bibasilar Rales Gastrointestinal: Soft and nontender. No distention.  No CVA tenderness. Genitourinary: deferred Musculoskeletal: Warm and well perfused Neurologic:  Normal speech and language. No  gross focal neurologic deficits are appreciated.  Skin:  Skin is warm, dry and intact. No rash noted. Psychiatric: Mood and affect are normal. Speech and behavior are normal.  ____________________________________________   LABS (all labs ordered are listed, but only abnormal results are displayed)  Labs Reviewed  COMPREHENSIVE METABOLIC PANEL - Abnormal; Notable for the following components:      Result Value   CO2 21 (*)    Glucose, Bld 190 (*)    Calcium 8.2 (*)    AST 45 (*)    All other components within normal limits  CBC WITH DIFFERENTIAL/PLATELET - Abnormal; Notable for the following components:   RBC 3.56 (*)    Hemoglobin 11.0 (*)    HCT 33.0 (*)    RDW 16.4 (*)    Lymphs Abs 0.6 (*)    All other components within normal limits  INFLUENZA PANEL BY PCR (TYPE A & B) - Abnormal; Notable for the following components:   Influenza A By PCR POSITIVE (*)    All other components within normal limits  CULTURE, BLOOD (ROUTINE X 2)  CULTURE, BLOOD (ROUTINE X 2)  URINE CULTURE  LACTIC ACID, PLASMA  LACTIC ACID, PLASMA   ____________________________________________  EKG  None ____________________________________________  RADIOLOGY  Chest x-ray consistent with multifocal pneumonia ____________________________________________   PROCEDURES  Procedure(s) performed: No  Procedures   Critical Care performed: No ____________________________________________   INITIAL IMPRESSION / ASSESSMENT AND PLAN / ED COURSE  Pertinent labs & imaging results that were available during my care of the patient were reviewed by  me and considered in my medical decision making (see chart for details).  Patient presents with fever, heart rate of 138, 92% on room air with complaints of cough.  Chest x-ray is consistent with pneumonia, influenza A positive as well.  Patient with multiple allergies reports that she can tolerate Rocephin so ordered IV Rocephin and azithromycin.  Admitted to the hospitalist service for further management    ____________________________________________   FINAL CLINICAL IMPRESSION(S) / ED DIAGNOSES  Final diagnoses:  Community acquired pneumonia, unspecified laterality        Note:  This document was prepared using Dragon voice recognition software and may include unintentional dictation errors.    Lavonia Drafts, MD 04/14/17 [id number]

## 2017-04-14 NOTE — ED Notes (Signed)
Patient placed on 2L [place] due to sats in low 90's. Patient sleeps with CPAP and patient is falling asleep. Patient is now 96% on 2L on [place]

## 2017-04-14 NOTE — ED Triage Notes (Signed)
Pt reports not feeling well last night; today after church she started with cough, fever and general body aches; sats 92% room air in triage; temp 103; last tylenol was this am; ibuprofen was around noon;

## 2017-04-14 NOTE — H&P (Signed)
[facility] NAME: Marie Clarke    MR#:  [id number]  DATE OF BIRTH:  1971/01/01  DATE OF ADMISSION:  04/14/2017  PRIMARY CARE PHYSICIAN: Sharyne Peach, MD   REQUESTING/REFERRING PHYSICIAN:   CHIEF COMPLAINT:   Chief Complaint  Patient presents with  . Cough  . Fever  . Generalized Body Aches    HISTORY OF PRESENT ILLNESS: Marie Clarke  is a 47 y.o. female with a known history of bipolar disorder, coronary artery disease, CHF, COPD, diabetes type 2 and other many comorbidities. Patient presents to emergency room for acute onset of productive cough, fever, and severe generalized body aches going on for the past 24 hours.  Mild nausea is associated with her symptoms but no vomiting or diarrhea, no abdominal pain.  Patient could not identify any factors that would improve or worsen her symptoms.  She does not recall any sick contacts; denies any travel. At the arrival to the emergency room, patient was febrile with temperature up to 103 and tachycardic with heart rate in 140s.  Respiratory rate was 34 and oxygen saturation 91% on room air. Blood work done in the emergency room is notable for lactic acid level at 1.3, glucose level of 190 and WBC 7.4.  Influenza a test is positive.  Chest x-ray, reviewed by myself, shows patchy opacities in the left lung base and right suprahilar region. And is admitted for further evaluation and treatment.  PAST MEDICAL HISTORY:   Past Medical History:  Diagnosis Date  . Anemia   . Anxiety   . Asthma   . Bipolar disorder ([place])   . CAD (coronary artery disease) unk  . CHF (congestive heart failure) ([place])   . COPD (chronic obstructive pulmonary disease) ([place])   . Depression unk  . Diabetes mellitus without complication ([place])   . Diabetes mellitus, type II ([place])   . Drug overdose   . GERD (gastroesophageal reflux disease)   . Headache   . Hyperlipidemia   . Hypertension   . Left leg pain  04/29/2014  . MI (myocardial infarction) ([place])   . Muscle ache 09/16/2014  . Osteoporosis   . Overactive bladder   . Pancreatitis unk  . Reflex sympathetic dystrophy   . Renal insufficiency   . Sleep apnea    pt reported on 2/6/7 she currently is not using CPAP  . Sleep apnea   . Stroke ([place])   . Thyroid disease   . TIA (transient ischemic attack) unk  . TIA (transient ischemic attack)     PAST SURGICAL HISTORY:  Past Surgical History:  Procedure Laterality Date  . ABDOMINAL HYSTERECTOMY    . prolapse rectum surgery N/A July 2016  . TONSILLECTOMY      SOCIAL HISTORY:  Social History   Tobacco Use  . Smoking status: Former Smoker    Packs/day: 1.00    Types: Cigarettes    Last attempt to quit: 06/12/2016    Years since quitting: 0.8  . Smokeless tobacco: Never Used  Substance Use Topics  . Alcohol use: No    Alcohol/week: 0.0 oz    FAMILY HISTORY:  Family History  Problem Relation Age of Onset  . Diabetes Mellitus II Mother   . CAD Mother   . Sleep apnea Mother   . Osteoarthritis Mother   . Osteoporosis Mother   . Anxiety disorder Mother   . Depression Mother   . Bipolar disorder Mother   .  Bipolar disorder Father   . Hypertension Father   . Depression Father   . Anxiety disorder Father   . Post-traumatic stress disorder Sister     DRUG ALLERGIES:  Allergies  Allergen Reactions  . Diazepam Hives and Nausea And Vomiting  . Sulfa Antibiotics Hives  . Cephalexin Hives  . Ciprofloxacin Hives  . Flagyl [Metronidazole] Hives  . Geodon [Ziprasidone Hcl]     CONFUSED  . Levofloxacin Hives  . Vancomycin Hives  . Penicillins Nausea And Vomiting    Has patient had a PCN reaction causing immediate rash, facial/tongue/throat swelling, SOB or lightheadedness with hypotension: No Has patient had a PCN reaction causing severe rash involving mucus membranes or skin necrosis: No Has patient had a PCN reaction that required hospitalization: No Has patient had a PCN  reaction occurring within the last 10 years: No If all of the above answers are "NO", then may proceed with Cephalosporin use.   . Pregabalin Hives and Rash    REVIEW OF SYSTEMS:   CONSTITUTIONAL: Positive for fever, fatigue and severe generalized weakness.  EYES: No blurred or double vision.  EARS, NOSE, AND THROAT: No tinnitus or ear pain.  RESPIRATORY: Sensitive for productive cough and shortness of breath with exertion.  No wheezing or hemoptysis.  CARDIOVASCULAR: No chest pain, orthopnea, edema.  GASTROINTESTINAL: Positive for nausea, but no vomiting, diarrhea or abdominal pain.  GENITOURINARY: No dysuria, hematuria.  ENDOCRINE: No polyuria, nocturia,  HEMATOLOGY: No bleeding SKIN: No rash or lesion. MUSCULOSKELETAL: Positive for severe generalized body aches.   NEUROLOGIC: No focal weakness.  PSYCHIATRY: Positive history of bipolar disorder.   MEDICATIONS AT HOME:  Prior to Admission medications   Medication Sig Start Date End Date Taking? Authorizing Provider  acetaminophen (TYLENOL) 325 MG tablet Take 1 tablet (325 mg total) by mouth every 6 (six) hours as needed for mild pain (or Fever >/= 101). 09/18/16  Yes Gouru, Aruna, MD  albuterol (PROVENTIL HFA;VENTOLIN HFA) 108 (90 BASE) MCG/ACT inhaler Inhale 2 puffs into the lungs every 4 (four) hours as needed for wheezing or shortness of breath.   Yes [provider]  aspirin EC 81 MG tablet Take 81 mg by mouth daily at 12 noon.   Yes [provider]  atorvastatin (LIPITOR) 40 MG tablet Take 40 mg by mouth at bedtime.    Yes [provider]  bumetanide (BUMEX) 2 MG tablet Take 1 tablet (2 mg total) by mouth 2 (two) times daily. 09/20/16 09/20/17 Yes Gouru, Aruna, MD  busPIRone (BUSPAR) 15 MG tablet Take 1 tablet (15 mg total) by mouth 3 (three) times daily. 03/06/17  Yes Ursula Alert, MD  gabapentin (NEURONTIN) 600 MG tablet Take 1,200 mg by mouth 3 (three) times daily.  04/19/16  Yes [provider]   ibuprofen (ADVIL,MOTRIN) 800 MG tablet Take 800 mg by mouth every 8 (eight) hours as needed.   Yes [provider]  KLOR-CON M20 20 MEQ tablet Take 20 mEq by mouth daily.  10/13/15  Yes [provider]  lamoTRIgine (LAMICTAL) 200 MG tablet Take 1 tablet (200 mg total) by mouth daily. 03/06/17  Yes Eappen, Ria Clock, MD  levothyroxine (SYNTHROID, LEVOTHROID) 200 MCG tablet Take 200 mcg by mouth daily.    Yes [provider]  levothyroxine (SYNTHROID, LEVOTHROID) 25 MCG tablet Take 25 mcg by mouth daily before breakfast.  06/13/16 05/23/17 Yes [provider]  montelukast (SINGULAIR) 10 MG tablet Take 10 mg by mouth daily.    Yes [provider]  ondansetron (ZOFRAN ODT) 4 MG disintegrating tablet Take 1 tablet (4 mg total) by mouth every 8 (eight) hours as needed for nausea or vomiting. 02/25/16  Yes Earleen Newport, MD  QUEtiapine (SEROQUEL) 25 MG tablet Take 1 tablet (25 mg total) by mouth 2 (two) times daily. 03/06/17  Yes Ursula Alert, MD  ranitidine (ZANTAC) 300 MG tablet Take 1 tablet (300 mg total) by mouth 2 (two) times daily. 11/30/14  Yes Carrie Mew, MD  tiotropium (SPIRIVA) 18 MCG inhalation capsule Place 18 mcg into inhaler and inhale at bedtime.   Yes [provider]  azelastine (ASTELIN) 0.1 % nasal spray Place 1 spray into both nostrils daily.    [provider]  bisoprolol (ZEBETA) 10 MG tablet Take 1 tablet (10 mg total) by mouth 2 (two) times daily. Patient not taking: Reported on 04/14/2017 09/19/16 09/19/17  Nicholes Mango, MD  chlorpheniramine-HYDROcodone [facility] PENNKINETIC ER) 10-8 MG/5ML SUER Take 5 mLs by mouth 2 (two) times daily. Patient not taking: Reported on 04/14/2017 02/25/16   Earleen Newport, MD  clopidogrel (PLAVIX) 75 MG tablet Take 1 tablet (75 mg total) by mouth daily. Patient not taking: Reported on 04/14/2017 09/20/16 09/20/17  Carrie Mew, MD  glipiZIDE (GLUCOTROL XL) 10 MG 24 hr tablet Take  10 mg by mouth.  03/20/16 01/26/17  [provider]  naproxen (NAPROSYN) 500 MG tablet Take 1 tablet (500 mg total) by mouth 2 (two) times daily as needed for mild pain or moderate pain. Patient not taking: Reported on 04/14/2017 11/26/16 11/26/17  Orbie Pyo, MD  polyethylene glycol [facility]) packet Take 17 g by mouth daily as needed. Patient not taking: Reported on 04/14/2017 02/27/17   Gregor Hams, MD  ranolazine (RANEXA) 500 MG 12 hr tablet Take 1 tablet (500 mg total) by mouth 2 (two) times daily. Patient not taking: Reported on 04/14/2017 09/18/16   Nicholes Mango, MD  varenicline (CHANTIX) 0.5 MG tablet Take 1 tablet (0.5 mg total) by mouth 2 (two) times daily. Patient not taking: Reported on 04/14/2017 03/19/17   Ursula Alert, MD      PHYSICAL EXAMINATION:   VITAL SIGNS: Blood pressure 110/85, pulse (!) 138, temperature (!) 103 F (39.4 C), temperature source Oral, resp. rate (!) 26, height 5\' 4"  (1.626 m), SpO2 92 %.  GENERAL:  47 y.o.-year-old patient lying in the bed.  She looks acutely ill, lethargic. EYES: Pupils equal, round, reactive to light and accommodation. No scleral icterus. Extraocular muscles intact.  HEENT: Head atraumatic, normocephalic. Oropharynx and nasopharynx clear.  NECK:  Supple, no jugular venous distention. No thyroid enlargement, no tenderness.  LUNGS: Reduced breath sounds bilaterally, no wheezing.  Scattered rhonchi are heard bilaterally. No use of accessory muscles of respiration.  CARDIOVASCULAR: S1, S2 normal. No S3/S4.  ABDOMEN: Obese, soft, nontender, nondistended. Bowel sounds present.   EXTREMITIES: No pedal edema, cyanosis, or clubbing.  NEUROLOGIC: No focal weakness.  Gait not checked, as patient is too weak to ambulate.  PSYCHIATRIC: The patient is alert and oriented x 3, although although she is lethargic.  SKIN: No obvious rash, lesion, or ulcer.   LABORATORY PANEL:   CBC Recent Labs  Lab 04/14/17 2129  WBC 7.4  HGB  11.0*  HCT 33.0*  PLT 199  MCV 92.6  MCH 30.9  MCHC 33.3  RDW 16.4*  LYMPHSABS 0.6*  MONOABS 0.3  EOSABS 0.0  BASOSABS 0.0   ------------------------------------------------------------------------------------------------------------------  Chemistries  Recent Labs  Lab 04/14/17 2129  NA  138  K 4.2  CL 109  CO2 21*  GLUCOSE 190*  BUN 18  CREATININE 0.92  CALCIUM 8.2*  AST 45*  ALT 42  ALKPHOS 97  BILITOT 0.7   ------------------------------------------------------------------------------------------------------------------ CrCl cannot be calculated (Unknown ideal weight.). ------------------------------------------------------------------------------------------------------------------ No results for input(s): TSH, T4TOTAL, T3FREE, THYROIDAB in the last 72 hours.  Invalid input(s): FREET3   Coagulation profile No results for input(s): INR, PROTIME in the last 168 hours. ------------------------------------------------------------------------------------------------------------------- No results for input(s): DDIMER in the last 72 hours. -------------------------------------------------------------------------------------------------------------------  Cardiac Enzymes No results for input(s): CKMB, TROPONINI, MYOGLOBIN in the last 168 hours.  Invalid input(s): CK ------------------------------------------------------------------------------------------------------------------ Invalid input(s): POCBNP  ---------------------------------------------------------------------------------------------------------------  Urinalysis    Component Value Date/Time   COLORURINE COLORLESS (A) 09/17/2016 1502   APPEARANCEUR CLEAR (A) 09/17/2016 1502   LABSPEC 1.011 09/17/2016 1502   PHURINE 7.0 09/17/2016 1502   GLUCOSEU NEGATIVE 09/17/2016 1502   HGBUR NEGATIVE 09/17/2016 1502   BILIRUBINUR NEGATIVE 09/17/2016 1502   KETONESUR NEGATIVE 09/17/2016 1502   PROTEINUR  NEGATIVE 09/17/2016 1502   NITRITE NEGATIVE 09/17/2016 1502   LEUKOCYTESUR NEGATIVE 09/17/2016 1502     RADIOLOGY: Dg Chest 2 View  Result Date: 04/14/2017 CLINICAL DATA:  Nonproductive cough.  Fever.  Low oxygen saturation. EXAM: CHEST  2 VIEW COMPARISON:  Radiographs 02/26/2017 FINDINGS: Mild patchy left lung base and right suprahilar opacities are new from prior exam. Unchanged chronic bronchial thickening. Normal heart size and mediastinal contours. No pleural fluid or pneumothorax. No acute osseous abnormalities. IMPRESSION: Patchy opacities in the left lung base and right suprahilar region suspicious for pneumonia in the setting of cough and fever. Electronically Signed   By: Jeb Levering M.D.   On: 04/14/2017 22:21    EKG: Orders placed or performed during the hospital encounter of 02/27/17  . ED EKG  . ED EKG  . EKG 12-Lead  . EKG 12-Lead  . EKG    IMPRESSION AND PLAN:  1.  Acute hypoxemic respiratory failure, secondary to pneumonia and flu.  We will start oxygen therapy, duo nebs, antibiotics IV, Tamiflu and fluid resuscitation.  Continue to monitor clinically closely. 2.  Sepsis, secondary to pneumonia and flu.  Management as above under #1.  Will follow lactic acid level. 3.  Multifocal, bilateral community-acquired pneumonia.  We will start patient on IV ceftriaxone and Zithromax.  Continue oxygen therapy and duo nebs as needed. 4.  Influenza A, will start Tamiflu and continue supportive measures. 5.  Diabetes type 2, stable we will treat with insulin.  During the hospital stay And monitor blood sugars before meals and at bedtime.  All the records are reviewed and case discussed with ED provider. Management plans discussed with the patient and she is in agreement.  CODE STATUS: Code Status History    Date Active Date Inactive Code Status Order ID Comments User Context   09/17/2016 16:15 09/18/2016 19:40 Full Code [id number]  Epifanio Lesches, MD ED   08/10/2014 01:00  08/11/2014 17:07 DNR [id number]  Lance Coon, MD Inpatient       TOTAL TIME TAKING CARE OF THIS PATIENT: 45 minutes.    Amelia Jo M.D on 04/14/2017 at 11:40 PM  Between 7am to 6pm - Pager - [phone]  After 6pm go to [email or web address] - password [email or web address] [facility]  Office  [phone]  CC: Primary care physician; Sharyne Peach, MD

## 2017-04-15 ENCOUNTER — Other Ambulatory Visit: Payer: Self-pay

## 2017-04-15 LAB — GLUCOSE, CAPILLARY
GLUCOSE-CAPILLARY: 150 mg/dL — AB (ref 65–99)
GLUCOSE-CAPILLARY: 103 mg/dL — AB (ref 65–99)
Glucose-Capillary: 93 mg/dL (ref 65–99)
Glucose-Capillary: 133 mg/dL — ABNORMAL HIGH (ref 65–99)

## 2017-04-15 LAB — BASIC METABOLIC PANEL
Anion gap: 7 (ref 5–15)
BUN: 18 mg/dL (ref 6–20)
CHLORIDE: 113 mmol/L — AB (ref 101–111)
CO2: 21 mmol/L — ABNORMAL LOW (ref 22–32)
Calcium: 7.9 mg/dL — ABNORMAL LOW (ref 8.9–10.3)
Creatinine, Ser: 0.79 mg/dL (ref 0.44–1.00)
GFR calc Af Amer: >60 mL/min (ref >60)
GFR calc non Af Amer: >60 mL/min (ref >60)
Glucose, Bld: 104 mg/dL — ABNORMAL HIGH (ref 65–99)
POTASSIUM: 3.8 mmol/L (ref 3.5–5.1)
SODIUM: 141 mmol/L (ref 135–145)

## 2017-04-15 LAB — CBC
HEMATOCRIT: 30.5 % — AB (ref 35.0–47.0)
Hemoglobin: 10.2 g/dL — ABNORMAL LOW (ref 12.0–16.0)
MCH: 31 pg (ref 26.0–34.0)
MCHC: 33.6 g/dL (ref 32.0–36.0)
MCV: 92.3 fL (ref 80.0–100.0)
Platelets: 138 10*3/uL — ABNORMAL LOW (ref 150–440)
RBC: 3.3 MIL/uL — ABNORMAL LOW (ref 3.80–5.20)
RDW: 16.3 % — AB (ref 11.5–14.5)
WBC: 5.6 10*3/uL (ref 3.6–11.0)

## 2017-04-15 LAB — URINALYSIS, COMPLETE (UACMP) WITH MICROSCOPIC
Bacteria, UA: NONE SEEN
Bilirubin Urine: NEGATIVE
Glucose, UA: NEGATIVE mg/dL
Hgb urine dipstick: NEGATIVE
Ketones, ur: NEGATIVE mg/dL
Leukocytes, UA: NEGATIVE
Nitrite: NEGATIVE
Protein, ur: NEGATIVE mg/dL
Specific Gravity, Urine: 1.021 (ref 1.005–1.030)
Squamous Epithelial / LPF: NONE SEEN
pH: 6 (ref 5.0–8.0)

## 2017-04-15 LAB — LACTIC ACID, PLASMA
LACTIC ACID, VENOUS: 0.7 mmol/L (ref 0.5–1.9)
Lactic Acid, Venous: 1.9 mmol/L (ref 0.5–1.9)

## 2017-04-15 LAB — PROCALCITONIN: Procalcitonin: 0.26 ng/mL

## 2017-04-15 MED ORDER — QUETIAPINE FUMARATE 25 MG PO TABS
25.0000 mg | ORAL_TABLET | Freq: Two times a day (BID) | ORAL | Status: DC
  Administered 2017-04-15 – 2017-04-16 (×3): 25 mg via ORAL
  Filled 2017-04-15 (×3): qty 1

## 2017-04-15 MED ORDER — INSULIN ASPART 100 UNIT/ML ~~LOC~~ SOLN
0.0000 [IU] | Freq: Three times a day (TID) | SUBCUTANEOUS | Status: DC
  Administered 2017-04-15: 13:00:00 1 [IU] via SUBCUTANEOUS
  Filled 2017-04-15: qty 1

## 2017-04-15 MED ORDER — ASPIRIN EC 81 MG PO TBEC
81.0000 mg | DELAYED_RELEASE_TABLET | Freq: Every day | ORAL | Status: DC
  Administered 2017-04-15 – 2017-04-16 (×2): 81 mg via ORAL
  Filled 2017-04-15 (×2): qty 1

## 2017-04-15 MED ORDER — MONTELUKAST SODIUM 10 MG PO TABS
10.0000 mg | ORAL_TABLET | Freq: Every day | ORAL | Status: DC
  Administered 2017-04-15 – 2017-04-16 (×2): 10 mg via ORAL
  Filled 2017-04-15 (×2): qty 1

## 2017-04-15 MED ORDER — DOCUSATE SODIUM 100 MG PO CAPS
100.0000 mg | ORAL_CAPSULE | Freq: Two times a day (BID) | ORAL | Status: DC
  Filled 2017-04-15 (×2): qty 1

## 2017-04-15 MED ORDER — TRAZODONE HCL 50 MG PO TABS: 25.0000 mg | ORAL_TABLET | Freq: Every evening | ORAL | Status: DC | PRN

## 2017-04-15 MED ORDER — ACETAMINOPHEN 650 MG RE SUPP: 650.0000 mg | Freq: Four times a day (QID) | RECTAL | Status: DC | PRN

## 2017-04-15 MED ORDER — HYDROCODONE-ACETAMINOPHEN 5-325 MG PO TABS
1.0000 | ORAL_TABLET | ORAL | Status: DC | PRN
  Administered 2017-04-16 (×2): 1 via ORAL
  Filled 2017-04-15 (×2): qty 1

## 2017-04-15 MED ORDER — BISOPROLOL FUMARATE 10 MG PO TABS
10.0000 mg | ORAL_TABLET | Freq: Two times a day (BID) | ORAL | Status: DC
  Filled 2017-04-15 (×2): qty 1

## 2017-04-15 MED ORDER — LAMOTRIGINE 100 MG PO TABS
200.0000 mg | ORAL_TABLET | Freq: Every day | ORAL | Status: DC
  Administered 2017-04-15 – 2017-04-16 (×2): 200 mg via ORAL
  Filled 2017-04-15 (×2): qty 2

## 2017-04-15 MED ORDER — IPRATROPIUM-ALBUTEROL 0.5-2.5 (3) MG/3ML IN SOLN
3.0000 mL | Freq: Four times a day (QID) | RESPIRATORY_TRACT | Status: DC
  Administered 2017-04-15 – 2017-04-16 (×6): 3 mL via RESPIRATORY_TRACT
  Filled 2017-04-15 (×7): qty 3

## 2017-04-15 MED ORDER — INSULIN ASPART 100 UNIT/ML ~~LOC~~ SOLN: 0.0000 [IU] | Freq: Every day | SUBCUTANEOUS | Status: DC

## 2017-04-15 MED ORDER — AZELASTINE HCL 0.1 % NA SOLN
1.0000 | Freq: Every day | NASAL | Status: DC
  Administered 2017-04-15 – 2017-04-16 (×2): 1 via NASAL
  Filled 2017-04-15: qty 30

## 2017-04-15 MED ORDER — CLOPIDOGREL BISULFATE 75 MG PO TABS
75.0000 mg | ORAL_TABLET | Freq: Every day | ORAL | Status: DC
  Administered 2017-04-15 – 2017-04-16 (×2): 75 mg via ORAL
  Filled 2017-04-15 (×2): qty 1

## 2017-04-15 MED ORDER — SODIUM CHLORIDE 0.9 % IV SOLN
INTRAVENOUS | Status: DC
  Administered 2017-04-15: 02:00:00 via INTRAVENOUS

## 2017-04-15 MED ORDER — GUAIFENESIN 100 MG/5ML PO SOLN
10.0000 mL | Freq: Four times a day (QID) | ORAL | Status: DC | PRN
Start: 2017-04-15 — End: 2017-04-16
  Administered 2017-04-15 – 2017-04-16 (×2): 200 mg via ORAL
  Filled 2017-04-15 (×3): qty 10

## 2017-04-15 MED ORDER — ONDANSETRON HCL 4 MG PO TABS
4.0000 mg | ORAL_TABLET | Freq: Four times a day (QID) | ORAL | Status: DC | PRN
  Administered 2017-04-15 – 2017-04-16 (×2): 4 mg via ORAL
  Filled 2017-04-15 (×2): qty 1

## 2017-04-15 MED ORDER — SODIUM CHLORIDE 0.9 % IV SOLN
1.0000 g | INTRAVENOUS | Status: DC
  Administered 2017-04-15 – 2017-04-16 (×2): 1 g via INTRAVENOUS
  Filled 2017-04-15 (×3): qty 10

## 2017-04-15 MED ORDER — FAMOTIDINE 20 MG PO TABS
10.0000 mg | ORAL_TABLET | Freq: Every day | ORAL | Status: DC
  Administered 2017-04-15 – 2017-04-16 (×2): 10 mg via ORAL
  Filled 2017-04-15 (×2): qty 1

## 2017-04-15 MED ORDER — HYDROCOD POLST-CPM POLST ER 10-8 MG/5ML PO SUER
5.0000 mL | Freq: Two times a day (BID) | ORAL | Status: DC
  Administered 2017-04-15 – 2017-04-16 (×4): 5 mL via ORAL
  Filled 2017-04-15 (×4): qty 5

## 2017-04-15 MED ORDER — GABAPENTIN 600 MG PO TABS
1200.0000 mg | ORAL_TABLET | Freq: Three times a day (TID) | ORAL | Status: DC
  Administered 2017-04-15 – 2017-04-16 (×4): 1200 mg via ORAL
  Filled 2017-04-15 (×4): qty 2

## 2017-04-15 MED ORDER — HEPARIN SODIUM (PORCINE) 5000 UNIT/ML IJ SOLN
5000.0000 [IU] | Freq: Three times a day (TID) | INTRAMUSCULAR | Status: DC
  Administered 2017-04-15 (×2): 5000 [IU] via SUBCUTANEOUS
  Filled 2017-04-15 (×2): qty 1

## 2017-04-15 MED ORDER — SODIUM CHLORIDE 0.9 % IV SOLN
500.0000 mg | INTRAVENOUS | Status: DC
  Administered 2017-04-15: 21:00:00 500 mg via INTRAVENOUS
  Filled 2017-04-15 (×2): qty 500

## 2017-04-15 MED ORDER — BISACODYL 5 MG PO TBEC: 5.0000 mg | DELAYED_RELEASE_TABLET | Freq: Every day | ORAL | Status: DC | PRN

## 2017-04-15 MED ORDER — ATORVASTATIN CALCIUM 20 MG PO TABS
40.0000 mg | ORAL_TABLET | Freq: Every day | ORAL | Status: DC
  Administered 2017-04-15 (×2): 40 mg via ORAL
  Filled 2017-04-15 (×2): qty 2

## 2017-04-15 MED ORDER — LEVOTHYROXINE SODIUM 50 MCG PO TABS
25.0000 ug | ORAL_TABLET | Freq: Every day | ORAL | Status: DC
  Administered 2017-04-15: 08:00:00 25 ug via ORAL
  Filled 2017-04-15: qty 1

## 2017-04-15 MED ORDER — LEVOTHYROXINE SODIUM 50 MCG PO TABS
200.0000 ug | ORAL_TABLET | Freq: Every day | ORAL | Status: DC
  Administered 2017-04-15: 08:00:00 200 ug via ORAL
  Filled 2017-04-15: qty 4

## 2017-04-15 MED ORDER — ONDANSETRON HCL 4 MG/2ML IJ SOLN: 4.0000 mg | Freq: Four times a day (QID) | INTRAMUSCULAR | Status: DC | PRN

## 2017-04-15 MED ORDER — BUSPIRONE HCL 5 MG PO TABS
15.0000 mg | ORAL_TABLET | Freq: Three times a day (TID) | ORAL | Status: DC
  Administered 2017-04-15 – 2017-04-16 (×4): 15 mg via ORAL
  Filled 2017-04-15 (×6): qty 3

## 2017-04-15 MED ORDER — ENOXAPARIN SODIUM 40 MG/0.4ML ~~LOC~~ SOLN
40.0000 mg | SUBCUTANEOUS | Status: DC
  Administered 2017-04-15: 40 mg via SUBCUTANEOUS
  Filled 2017-04-15: qty 0.4

## 2017-04-15 MED ORDER — TIOTROPIUM BROMIDE MONOHYDRATE 18 MCG IN CAPS
18.0000 ug | ORAL_CAPSULE | Freq: Every day | RESPIRATORY_TRACT | Status: DC
  Administered 2017-04-15: 18 ug via RESPIRATORY_TRACT
  Filled 2017-04-15: qty 5

## 2017-04-15 MED ORDER — LOPERAMIDE HCL 2 MG PO CAPS: 4.0000 mg | ORAL_CAPSULE | Freq: Four times a day (QID) | ORAL | Status: DC | PRN

## 2017-04-15 MED ORDER — BISOPROLOL FUMARATE 10 MG PO TABS: 10.0000 mg | ORAL_TABLET | Freq: Two times a day (BID) | ORAL | Status: DC

## 2017-04-15 MED ORDER — LOPERAMIDE HCL 2 MG PO CAPS: 4.0000 mg | ORAL_CAPSULE | ORAL | Status: DC | PRN

## 2017-04-15 MED ORDER — ACETAMINOPHEN 325 MG PO TABS
650.0000 mg | ORAL_TABLET | Freq: Four times a day (QID) | ORAL | Status: DC | PRN
  Administered 2017-04-15: 650 mg via ORAL
  Filled 2017-04-15: qty 2

## 2017-04-15 NOTE — Progress Notes (Signed)
[facility] [place] NAME: Marie Clarke    MR#:  [id number]  DATE OF BIRTH:  Oct 31, 1970  SUBJECTIVE:  CHIEF COMPLAINT:   Chief Complaint  Patient presents with  . Cough  . Fever  . Generalized Body Aches    Came with fever, generalized bodyaches, shortness of breath and cough.   Noted to have pneumonia and flu.  REVIEW OF SYSTEMS:  CONSTITUTIONAL: Positive for fever, fatigue or weakness.  EYES: No blurred or double vision.  EARS, NOSE, AND THROAT: No tinnitus or ear pain.  RESPIRATORY: She does have cough, shortness of breath, wheezing , no hemoptysis.  CARDIOVASCULAR: No chest pain, orthopnea, edema.  GASTROINTESTINAL: No nausea, vomiting, diarrhea or abdominal pain.  GENITOURINARY: No dysuria, hematuria.  ENDOCRINE: No polyuria, nocturia,  HEMATOLOGY: No anemia, easy bruising or bleeding SKIN: No rash or lesion. MUSCULOSKELETAL: No joint pain or arthritis.   NEUROLOGIC: No tingling, numbness, weakness.  PSYCHIATRY: No anxiety or depression.   ROS  DRUG ALLERGIES:   Allergies  Allergen Reactions  . Diazepam Hives and Nausea And Vomiting  . Sulfa Antibiotics Hives  . Cephalexin Hives  . Ciprofloxacin Hives  . Flagyl [Metronidazole] Hives  . Geodon [Ziprasidone Hcl]     CONFUSED  . Levofloxacin Hives  . Vancomycin Hives  . Penicillins Nausea And Vomiting    Has patient had a PCN reaction causing immediate rash, facial/tongue/throat swelling, SOB or lightheadedness with hypotension: No Has patient had a PCN reaction causing severe rash involving mucus membranes or skin necrosis: No Has patient had a PCN reaction that required hospitalization: No Has patient had a PCN reaction occurring within the last 10 years: No If all of the above answers are "NO", then may proceed with Cephalosporin use.   . Pregabalin Hives and Rash    VITALS:  Blood pressure 128/89, pulse 99, temperature 97.8 F (36.6 C), temperature source Oral,  resp. rate 18, height 5\' 4"  (1.626 m), weight 102.1 kg (225 lb), SpO2 98 %.  PHYSICAL EXAMINATION:  GENERAL:  47 y.o.-year-old patient lying in the bed with no acute distress.  EYES: Pupils equal, round, reactive to light and accommodation. No scleral icterus. Extraocular muscles intact.  HEENT: Head atraumatic, normocephalic. Oropharynx and nasopharynx clear.  NECK:  Supple, no jugular venous distention. No thyroid enlargement, no tenderness.  LUNGS: Normal breath sounds bilaterally, no wheezing, bilateral crepitation. No use of accessory muscles of respiration.  CARDIOVASCULAR: S1, S2 normal. No murmurs, rubs, or gallops.  ABDOMEN: Soft, nontender, nondistended. Bowel sounds present. No organomegaly or mass.  EXTREMITIES: No pedal edema, cyanosis, or clubbing.  NEUROLOGIC: Cranial nerves II through XII are intact. Muscle strength 5/5 in all extremities. Sensation intact. Gait not checked.  PSYCHIATRIC: The patient is alert and oriented x 3.  SKIN: No obvious rash, lesion, or ulcer.   Physical Exam LABORATORY PANEL:   CBC Recent Labs  Lab 04/15/17 0515  WBC 5.6  HGB 10.2*  HCT 30.5*  PLT 138*   ------------------------------------------------------------------------------------------------------------------  Chemistries  Recent Labs  Lab 04/14/17 2129 04/15/17 0515  NA 138 141  K 4.2 3.8  CL 109 113*  CO2 21* 21*  GLUCOSE 190* 104*  BUN 18 18  CREATININE 0.92 0.79  CALCIUM 8.2* 7.9*  AST 45*  --   ALT 42  --   ALKPHOS 97  --   BILITOT 0.7  --    ------------------------------------------------------------------------------------------------------------------  Cardiac Enzymes No results for input(s): TROPONINI in the last 168  hours. ------------------------------------------------------------------------------------------------------------------  RADIOLOGY:  Dg Chest 2 View  Result Date: 04/14/2017 CLINICAL DATA:  Nonproductive cough.  Fever.  Low oxygen  saturation. EXAM: CHEST  2 VIEW COMPARISON:  Radiographs 02/26/2017 FINDINGS: Mild patchy left lung base and right suprahilar opacities are new from prior exam. Unchanged chronic bronchial thickening. Normal heart size and mediastinal contours. No pleural fluid or pneumothorax. No acute osseous abnormalities. IMPRESSION: Patchy opacities in the left lung base and right suprahilar region suspicious for pneumonia in the setting of cough and fever. Electronically Signed   By: Jeb Levering M.D.   On: 04/14/2017 22:21    ASSESSMENT AND PLAN:   Active Problems:   Sepsis ([place])   1.  Acute hypoxemic respiratory failure, secondary to pneumonia and flu.    oxygen therapy, duo nebs, antibiotics IV, Tamiflu and fluid resuscitation.  Continue to monitor clinically closely. 2.  Sepsis, secondary to pneumonia and flu.  Management as above under #1.   3.  Multifocal, bilateral community-acquired pneumonia.  We will start patient on IV ceftriaxone and Zithromax.  Continue oxygen therapy and duo nebs as needed.  procalcitonin is high. 4.  Influenza A,  Tamiflu and continue supportive measures. 5.  Diabetes type 2, stable    monitor blood sugars before meals and at bedtime. 6. Diarrhea- check for c diff and if negative, give imodium.   All the records are reviewed and case discussed with Care Management/Social Workerr. Management plans discussed with the patient, family and they are in agreement.  CODE STATUS: DNR.  TOTAL TIME TAKING CARE OF THIS PATIENT: 35 minutes.    POSSIBLE D/C IN 1-2 DAYS, DEPENDING ON CLINICAL CONDITION.   Vaughan Basta M.D on 04/15/2017   Between 7am to 6pm - Pager - [phone]  After 6pm go to [email or web address] - password [email or web address] [facility]  Office  [phone]  CC: Primary care physician; Sharyne Peach, MD  Note: This dictation was prepared with Dragon dictation along with smaller phrase technology. Any transcriptional errors that  result from this process are unintentional.

## 2017-04-15 NOTE — Plan of Care (Signed)
  RD identified need for nutrition education regarding diabetes during initial assessment.  No results found for: HGBA1C  RD provided "Ready, Set, Start Counting" handout from the Academy of [employer] and Dietetics. Discussed different food groups and their effects on blood sugar, emphasizing carbohydrate-containing foods. Provided list of carbohydrates and recommended serving sizes of common foods.  Discussed importance of controlled and consistent carbohydrate intake throughout the day. Provided examples of ways to balance meals/snacks and encouraged intake of high-fiber, whole grain complex carbohydrates. Teach back method used.  Expect good compliance.  Body mass index is 38.62 kg/m. Pt meets criteria for Obesity Class II based on current BMI.  Current diet order is Carbohydrate Modified, patient is consuming approximately 100% of meals at this time per her report. Labs and medications reviewed. No further nutrition interventions warranted at this time. RD contact information provided. If additional nutrition issues arise, please re-consult RD.  Willey Blade, MS, [place], LDN Office: [phone] Pager: [phone] After Hours/Weekend Pager: [phone]

## 2017-04-15 NOTE — Progress Notes (Signed)
Nutrition Brief Note  Patient identified on the Malnutrition Screening Tool (MST) Report  Wt Readings from Last 15 Encounters:  04/15/17 225 lb (102.1 kg)  02/26/17 205 lb (93 kg)  11/26/16 235 lb (106.6 kg)  09/20/16 215 lb (97.5 kg)  09/18/16 215 lb 12.8 oz (97.9 kg)  08/12/16 217 lb (98.4 kg)  07/04/16 236 lb (107 kg)  06/24/16 225 lb (102.1 kg)  06/24/16 225 lb (102.1 kg)  04/26/16 223 lb (101.2 kg)  02/25/16 225 lb (102.1 kg)  07/19/15 235 lb (106.6 kg)  07/01/15 235 lb (106.6 kg)  04/23/15 235 lb (106.6 kg)  03/28/15 230 lb (104.3 kg)   Met with patient at bedside. She reports her appetite is good now and was also good PTA. She eats 100% of at least 3 meals daily. She reports she has had slow weight loss over the past 3 years but it was intentional weight loss. Both of her parents passed away in 04-19-2013 and they were both obese, so she is trying to make lifestyle changes to get to a healthier weight. She has been cutting back on sweets and watching her portion sizes. She reports she had been 330 lbs about 3 years ago and has lost 100 lbs since then. During assessment discussed patient's diagnosis of DM. She reports she has never learned about it and would like a handout.  No wasting of subcutaneous fat or muscle on Nutrition-Focused Physical Exam.  Patient does not meet criteria for malnutrition.  Body mass index is 38.62 kg/m. Patient meets criteria for Obesity Class II based on current BMI.   Current diet order is Carbohydrate Modified, patient is consuming approximately 100% of meals at this time per her report. Labs and medications reviewed.   Educated patient on carbohydrate counting (note to follow). No further nutrition interventions warranted at this time. If nutrition issues arise, please consult RD.   Willey Blade, MS, [place], LDN Office: [phone] Pager: [phone] After Hours/Weekend Pager: [phone]

## 2017-04-15 NOTE — Progress Notes (Signed)
The patient is admitted to room 108 with the diagnosis of sepsis. Alert and oriented x 4 but drowsy. Denies any acute pain. Admission profile  Completed. Will continue to monitor.

## 2017-04-16 LAB — URINE CULTURE: Culture: <10000 — AB

## 2017-04-16 LAB — GLUCOSE, CAPILLARY
GLUCOSE-CAPILLARY: 116 mg/dL — AB (ref 65–99)
Glucose-Capillary: 98 mg/dL (ref 65–99)

## 2017-04-16 LAB — PROCALCITONIN: Procalcitonin: 0.17 ng/mL

## 2017-04-16 MED ORDER — NICOTINE 10 MG IN INHA: 1.0000 | RESPIRATORY_TRACT | 0 refills | Status: DC | PRN

## 2017-04-16 MED ORDER — HYDROCODONE-ACETAMINOPHEN 5-325 MG PO TABS: 1.0000 | ORAL_TABLET | ORAL | 0 refills | Status: DC | PRN

## 2017-04-16 MED ORDER — CEPHALEXIN 250 MG PO CAPS: 250.0000 mg | ORAL_CAPSULE | Freq: Two times a day (BID) | ORAL | 0 refills | Status: AC

## 2017-04-16 MED ORDER — AZITHROMYCIN 500 MG PO TABS: 500.0000 mg | ORAL_TABLET | Freq: Every day | ORAL | 0 refills | Status: AC

## 2017-04-16 MED ORDER — OSELTAMIVIR PHOSPHATE 75 MG PO CAPS: 75.0000 mg | ORAL_CAPSULE | Freq: Two times a day (BID) | ORAL | 0 refills | Status: AC

## 2017-04-16 MED ORDER — AZITHROMYCIN 500 MG PO TABS: 500.0000 mg | ORAL_TABLET | Freq: Every day | ORAL | Status: DC

## 2017-04-16 NOTE — Discharge Summary (Signed)
[facility] NAME: Marie Clarke    MR#:  [id number]  DATE OF BIRTH:  Jan 28, 1971  DATE OF ADMISSION:  04/14/2017 ADMITTING PHYSICIAN: Amelia Jo, MD  DATE OF DISCHARGE: 04/16/2017   PRIMARY CARE PHYSICIAN: Sharyne Peach, MD    ADMISSION DIAGNOSIS:  Community acquired pneumonia, unspecified laterality [J18.9]  DISCHARGE DIAGNOSIS:  Active Problems:   Sepsis ([place])    Pneumonia,   Influenza  SECONDARY DIAGNOSIS:   Past Medical History:  Diagnosis Date  . Anemia   . Anxiety   . Asthma   . Bipolar disorder ([place])   . CAD (coronary artery disease) unk  . CHF (congestive heart failure) ([place])   . COPD (chronic obstructive pulmonary disease) ([place])   . Depression unk  . Diabetes mellitus without complication ([place])   . Diabetes mellitus, type II ([place])   . Drug overdose   . GERD (gastroesophageal reflux disease)   . Headache   . Hyperlipidemia   . Hypertension   . Left leg pain 04/29/2014  . MI (myocardial infarction) ([place])   . Muscle ache 09/16/2014  . Osteoporosis   . Overactive bladder   . Pancreatitis unk  . Reflex sympathetic dystrophy   . Renal insufficiency   . Sleep apnea    pt reported on 2/6/7 she currently is not using CPAP  . Sleep apnea   . Stroke ([place])   . Thyroid disease   . TIA (transient ischemic attack) unk  . TIA (transient ischemic attack)     HOSPITAL COURSE:   1.Acute hypoxemic respiratory failure,secondary to pneumonia and flu.  oxygen therapy, duo nebs, antibiotics IV,Tamiflu and fluid resuscitation.Continue to monitor clinically closely.  Improved, able to walk in hallway without any hypoxia on room air. 2.Sepsis,secondary to pneumonia and flu.Management as above under #1. 3.Multifocal, bilateral community-acquired pneumonia.We will start patient on IV ceftriaxone and Zithromax.Continue oxygen therapy and duo nebs as needed.  procalcitonin is high. 4.Influenza  A, Tamiflu and continue supportive measures. 5.Diabetes type 2, stable    monitor blood sugars before meals and at bedtime. 6. Diarrhea- check for c diff and if negative, give imodium.    Pt have regular stool today.  DISCHARGE CONDITIONS:   Stable.  CONSULTS OBTAINED:    DRUG ALLERGIES:   Allergies  Allergen Reactions  . Diazepam Hives and Nausea And Vomiting  . Sulfa Antibiotics Hives  . Cephalexin Hives  . Ciprofloxacin Hives  . Flagyl [Metronidazole] Hives  . Geodon [Ziprasidone Hcl]     CONFUSED  . Levofloxacin Hives  . Vancomycin Hives  . Penicillins Nausea And Vomiting    Has patient had a PCN reaction causing immediate rash, facial/tongue/throat swelling, SOB or lightheadedness with hypotension: No Has patient had a PCN reaction causing severe rash involving mucus membranes or skin necrosis: No Has patient had a PCN reaction that required hospitalization: No Has patient had a PCN reaction occurring within the last 10 years: No If all of the above answers are "NO", then may proceed with Cephalosporin use.   . Pregabalin Hives and Rash    DISCHARGE MEDICATIONS:   Allergies as of 04/16/2017      Reactions   Diazepam Hives, Nausea And Vomiting   Sulfa Antibiotics Hives   Cephalexin Hives   Ciprofloxacin Hives   Flagyl [metronidazole] Hives   Geodon [ziprasidone Hcl]    CONFUSED   Levofloxacin Hives   Vancomycin Hives   Penicillins Nausea And Vomiting   Has  patient had a PCN reaction causing immediate rash, facial/tongue/throat swelling, SOB or lightheadedness with hypotension: No Has patient had a PCN reaction causing severe rash involving mucus membranes or skin necrosis: No Has patient had a PCN reaction that required hospitalization: No Has patient had a PCN reaction occurring within the last 10 years: No If all of the above answers are "NO", then may proceed with Cephalosporin use.   Pregabalin Hives, Rash      Medication List    TAKE these  medications   acetaminophen 325 MG tablet Commonly known as:  TYLENOL Take 1 tablet (325 mg total) by mouth every 6 (six) hours as needed for mild pain (or Fever >/= 101).   albuterol 108 (90 Base) MCG/ACT inhaler Commonly known as:  PROVENTIL HFA;VENTOLIN HFA Inhale 2 puffs into the lungs every 4 (four) hours as needed for wheezing or shortness of breath.   aspirin EC 81 MG tablet Take 81 mg by mouth daily at 12 noon.   atorvastatin 40 MG tablet Commonly known as:  LIPITOR Take 40 mg by mouth at bedtime.   azelastine 0.1 % nasal spray Commonly known as:  ASTELIN Place 1 spray into both nostrils daily.   azithromycin 500 MG tablet Commonly known as:  ZITHROMAX Take 1 tablet (500 mg total) by mouth daily at 6 PM for 3 days.   bisoprolol 10 MG tablet Commonly known as:  ZEBETA Take 1 tablet (10 mg total) by mouth 2 (two) times daily.   bumetanide 2 MG tablet Commonly known as:  BUMEX Take 1 tablet (2 mg total) by mouth 2 (two) times daily.   busPIRone 15 MG tablet Commonly known as:  BUSPAR Take 1 tablet (15 mg total) by mouth 3 (three) times daily.   cephALEXin 250 MG capsule Commonly known as:  KEFLEX Take 1 capsule (250 mg total) by mouth 2 (two) times daily for 4 days.   chlorpheniramine-HYDROcodone 10-8 MG/5ML Suer Commonly known as:  TUSSIONEX PENNKINETIC ER Take 5 mLs by mouth 2 (two) times daily.   clopidogrel 75 MG tablet Commonly known as:  PLAVIX Take 1 tablet (75 mg total) by mouth daily.   gabapentin 600 MG tablet Commonly known as:  NEURONTIN Take 1,200 mg by mouth 3 (three) times daily.   glipiZIDE 10 MG 24 hr tablet Commonly known as:  GLUCOTROL XL Take 10 mg by mouth.   HYDROcodone-acetaminophen 5-325 MG tablet Commonly known as:  NORCO/VICODIN Take 1-2 tablets by mouth every 4 (four) hours as needed for moderate pain.   ibuprofen 800 MG tablet Commonly known as:  ADVIL,MOTRIN Take 800 mg by mouth every 8 (eight) hours as needed.    KLOR-CON M20 20 MEQ tablet Generic drug:  potassium chloride SA Take 20 mEq by mouth daily.   lamoTRIgine 200 MG tablet Commonly known as:  LAMICTAL Take 1 tablet (200 mg total) by mouth daily.   levothyroxine 200 MCG tablet Commonly known as:  SYNTHROID, LEVOTHROID Take 200 mcg by mouth daily.   levothyroxine 25 MCG tablet Commonly known as:  SYNTHROID, LEVOTHROID Take 25 mcg by mouth daily before breakfast.   montelukast 10 MG tablet Commonly known as:  SINGULAIR Take 10 mg by mouth daily.   naproxen 500 MG tablet Commonly known as:  NAPROSYN Take 1 tablet (500 mg total) by mouth 2 (two) times daily as needed for mild pain or moderate pain.   nicotine 10 MG inhaler Commonly known as:  NICOTROL Inhale 1 Cartridge (1 continuous puffing total) into  the lungs as needed for smoking cessation.   ondansetron 4 MG disintegrating tablet Commonly known as:  ZOFRAN ODT Take 1 tablet (4 mg total) by mouth every 8 (eight) hours as needed for nausea or vomiting.   oseltamivir 75 MG capsule Commonly known as:  TAMIFLU Take 1 capsule (75 mg total) by mouth 2 (two) times daily for 3 days.   polyethylene glycol packet Commonly known as:  MIRALAX Take 17 g by mouth daily as needed.   QUEtiapine 25 MG tablet Commonly known as:  SEROQUEL Take 1 tablet (25 mg total) by mouth 2 (two) times daily.   ranitidine 300 MG tablet Commonly known as:  ZANTAC Take 1 tablet (300 mg total) by mouth 2 (two) times daily.   ranolazine 500 MG 12 hr tablet Commonly known as:  RANEXA Take 1 tablet (500 mg total) by mouth 2 (two) times daily.   tiotropium 18 MCG inhalation capsule Commonly known as:  SPIRIVA Place 18 mcg into inhaler and inhale at bedtime.   varenicline 0.5 MG tablet Commonly known as:  CHANTIX Take 1 tablet (0.5 mg total) by mouth 2 (two) times daily.        DISCHARGE INSTRUCTIONS:    Follow with PMD in 1-2 weeks.  If you experience worsening of your admission  symptoms, develop shortness of breath, life threatening emergency, suicidal or homicidal thoughts you must seek medical attention immediately by calling 911 or calling your MD immediately  if symptoms less severe.  You Must read complete instructions/literature along with all the possible adverse reactions/side effects for all the Medicines you take and that have been prescribed to you. Take any new Medicines after you have completely understood and accept all the possible adverse reactions/side effects.   Please note  You were cared for by a hospitalist during your hospital stay. If you have any questions about your discharge medications or the care you received while you were in the hospital after you are discharged, you can call the unit and asked to speak with the hospitalist on call if the hospitalist that took care of you is not available. Once you are discharged, your primary care physician will handle any further medical issues. Please note that NO REFILLS for any discharge medications will be authorized once you are discharged, as it is imperative that you return to your primary care physician (or establish a relationship with a primary care physician if you do not have one) for your aftercare needs so that they can reassess your need for medications and monitor your lab values.   Today   CHIEF COMPLAINT:   Chief Complaint  Patient presents with  . Cough  . Fever  . Generalized Body Aches    HISTORY OF PRESENT ILLNESS:  Marie Clarke  is a 47 y.o. female with a known history of bipolar disorder, coronary artery disease, CHF, COPD, diabetes type 2 and other many comorbidities. Patient presents to emergency room for acute onset of productive cough, fever, and severe generalized body aches going on for the past 24 hours.  Mild nausea is associated with her symptoms but no vomiting or diarrhea, no abdominal pain.  Patient could not identify any factors that would improve or worsen her  symptoms.  She does not recall any sick contacts; denies any travel. At the arrival to the emergency room, patient was febrile with temperature up to 103 and tachycardic with heart rate in 140s.  Respiratory rate was 34 and oxygen saturation 91% on room air.  Blood work done in the emergency room is notable for lactic acid level at 1.3, glucose level of 190 and WBC 7.4.  Influenza a test is positive.  Chest x-ray, reviewed by myself, shows patchy opacities in the left lung base and right suprahilar region. And is admitted for further evaluation and treatment.    VITAL SIGNS:  Blood pressure 105/73, pulse 86, temperature 97.7 F (36.5 C), temperature source Oral, resp. rate 17, height 5\' 4"  (1.626 m), weight 103.5 kg (228 lb 3.2 oz), SpO2 93 %.  I/O:    Intake/Output Summary (Last 24 hours) at 04/16/2017 1231 Last data filed at 04/16/2017 1011 Gross per 24 hour  Intake 930 ml  Output 3 ml  Net 927 ml    PHYSICAL EXAMINATION:  GENERAL:  47 y.o.-year-old patient lying in the bed with no acute distress.  EYES: Pupils equal, round, reactive to light and accommodation. No scleral icterus. Extraocular muscles intact.  HEENT: Head atraumatic, normocephalic. Oropharynx and nasopharynx clear.  NECK:  Supple, no jugular venous distention. No thyroid enlargement, no tenderness.  LUNGS: Normal breath sounds bilaterally, no wheezing, rales,rhonchi or crepitation. No use of accessory muscles of respiration.  CARDIOVASCULAR: S1, S2 normal. No murmurs, rubs, or gallops.  ABDOMEN: Soft, non-tender, non-distended. Bowel sounds present. No organomegaly or mass.  EXTREMITIES: No pedal edema, cyanosis, or clubbing.  NEUROLOGIC: Cranial nerves II through XII are intact. Muscle strength 5/5 in all extremities. Sensation intact. Gait not checked.  PSYCHIATRIC: The patient is alert and oriented x 3.  SKIN: No obvious rash, lesion, or ulcer.   DATA REVIEW:   CBC Recent Labs  Lab 04/15/17 0515  WBC 5.6   HGB 10.2*  HCT 30.5*  PLT 138*    Chemistries  Recent Labs  Lab 04/14/17 2129 04/15/17 0515  NA 138 141  K 4.2 3.8  CL 109 113*  CO2 21* 21*  GLUCOSE 190* 104*  BUN 18 18  CREATININE 0.92 0.79  CALCIUM 8.2* 7.9*  AST 45*  --   ALT 42  --   ALKPHOS 97  --   BILITOT 0.7  --     Cardiac Enzymes No results for input(s): TROPONINI in the last 168 hours.  Microbiology Results  Results for orders placed or performed during the hospital encounter of 04/14/17  Blood Culture (routine x 2)     Status: None (Preliminary result)   Collection Time: 04/14/17 10:02 PM  Result Value Ref Range Status   Specimen Description BLOOD LEFT ANTECUBITAL  Final   Special Requests   Final    BOTTLES DRAWN AEROBIC AND ANAEROBIC Blood Culture results may not be optimal due to an excessive volume of blood received in culture bottles   Culture   Final    NO GROWTH 2 DAYS Performed at [facility], [address], [place], [place] [address]    Report Status PENDING  Incomplete  Blood Culture (routine x 2)     Status: None (Preliminary result)   Collection Time: 04/14/17 10:02 PM  Result Value Ref Range Status   Specimen Description BLOOD RIGHT ANTECUBITAL  Final   Special Requests   Final    BOTTLES DRAWN AEROBIC AND ANAEROBIC Blood Culture adequate volume   Culture   Final    NO GROWTH 2 DAYS Performed at [facility], [address] [place], [place] [address]    Report Status PENDING  Incomplete  Urine culture     Status: Abnormal   Collection Time: 04/14/17 10:23  PM  Result Value Ref Range Status   Specimen Description   Final    URINE, RANDOM Performed at [facility], [address] [place], [place] [address]    Special Requests   Final    NONE Performed at [facility], [address] [place], [place] [address]    Culture (A)  Final    <10,000 COLONIES/mL INSIGNIFICANT GROWTH Performed at [facility] [address]  [place],  [place] [address]    Report Status 04/16/2017 FINAL  Final    RADIOLOGY:  Dg Chest 2 View  Result Date: 04/14/2017 CLINICAL DATA:  Nonproductive cough.  Fever.  Low oxygen saturation. EXAM: CHEST  2 VIEW COMPARISON:  Radiographs 02/26/2017 FINDINGS: Mild patchy left lung base and right suprahilar opacities are new from prior exam. Unchanged chronic bronchial thickening. Normal heart size and mediastinal contours. No pleural fluid or pneumothorax. No acute osseous abnormalities. IMPRESSION: Patchy opacities in the left lung base and right suprahilar region suspicious for pneumonia in the setting of cough and fever. Electronically Signed   By: Jeb Levering M.D.   On: 04/14/2017 22:21    EKG:   Orders placed or performed during the hospital encounter of 02/27/17  . ED EKG  . ED EKG  . EKG 12-Lead  . EKG 12-Lead  . EKG      Management plans discussed with the patient, family and they are in agreement.  CODE STATUS:     Code Status Orders  (From admission, onward)        Start     Ordered   04/15/17 0058  Do not attempt resuscitation (DNR)  Continuous    Question Answer Comment  In the event of cardiac or respiratory ARREST Do not call a "code blue"   In the event of cardiac or respiratory ARREST Do not perform Intubation, CPR, defibrillation or ACLS   In the event of cardiac or respiratory ARREST Use medication by any route, position, wound care, and other measures to relive pain and suffering. May use oxygen, suction and manual treatment of airway obstruction as needed for comfort.      04/15/17 0057    Code Status History    Date Active Date Inactive Code Status Order ID Comments User Context   09/17/2016 16:15 09/18/2016 19:40 Full Code [id number]  Epifanio Lesches, MD ED   08/10/2014 01:00 08/11/2014 17:07 DNR [id number]  Lance Coon, MD Inpatient      TOTAL TIME TAKING CARE OF THIS PATIENT: 35 minutes.    Vaughan Basta M.D on 04/16/2017 at 12:31  PM  Between 7am to 6pm - Pager - [phone]  After 6pm go to [email or web address] - password [email or web address] [facility]  Office  [phone]  CC: Primary care physician; Sharyne Peach, MD   Note: This dictation was prepared with Dragon dictation along with smaller phrase technology. Any transcriptional errors that result from this process are unintentional.

## 2017-04-16 NOTE — Progress Notes (Signed)
Pt has had moderate formed stool.  Spoke with Dr. Anselm Jungling, orders received to discontinue stool samples and Enteric precautions. Pt updated on this

## 2017-04-16 NOTE — Plan of Care (Signed)
  Education: Knowledge of General Education information will improve 04/16/2017 0528 - Progressing by Jeffie Pollock, RN   Health Behavior/Discharge Planning: Ability to manage health-related needs will improve 04/16/2017 0528 - Progressing by Jeffie Pollock, RN   Clinical Measurements: Ability to maintain clinical measurements within normal limits will improve 04/16/2017 0528 - Progressing by Jeffie Pollock, RN Will remain free from infection 04/16/2017 0528 - Progressing by Jeffie Pollock, RN Diagnostic test results will improve 04/16/2017 0528 - Progressing by Jeffie Pollock, RN Respiratory complications will improve 04/16/2017 0528 - Progressing by Jeffie Pollock, RN Cardiovascular complication will be avoided 04/16/2017 0528 - Progressing by Jeffie Pollock, RN   Activity: Risk for activity intolerance will decrease 04/16/2017 0528 - Progressing by Jeffie Pollock, RN   Coping: Level of anxiety will decrease 04/16/2017 0528 - Progressing by Jeffie Pollock, RN   Elimination: Will not experience complications related to bowel motility 04/16/2017 0528 - Progressing by Jeffie Pollock, RN Will not experience complications related to urinary retention 04/16/2017 0528 - Progressing by Jeffie Pollock, RN   Pain Managment: General experience of comfort will improve 04/16/2017 0528 - Progressing by Jeffie Pollock, RN   Safety: Ability to remain free from injury will improve 04/16/2017 0528 - Progressing by Jeffie Pollock, RN   Skin Integrity: Risk for impaired skin integrity will decrease 04/16/2017 0528 - Progressing by Jeffie Pollock, RN   Spiritual Needs Ability to function at adequate level 04/16/2017 0528 - Progressing by Jeffie Pollock, RN   Fluid Volume: Hemodynamic stability will improve 04/16/2017 0528 - Progressing by Jeffie Pollock, RN   Clinical Measurements: Diagnostic test results will improve 04/16/2017 0528  - Progressing by Jeffie Pollock, RN Signs and symptoms of infection will decrease 04/16/2017 0528 - Progressing by Jeffie Pollock, RN   Respiratory: Ability to maintain adequate ventilation will improve 04/16/2017 0528 - Progressing by Jeffie Pollock, RN

## 2017-04-16 NOTE — Progress Notes (Signed)
Pt discharged to home via wc.  Instructions and rx given to pt.  Questions answered.  No distress.  

## 2017-04-16 NOTE — Care Management Important Message (Signed)
Important Message  Patient Details  Name: Marie Clarke MRN: [id number] Date of Birth: [birth year]   Medicare Important Message Given:  Yes    Shelbie Ammons, RN 04/16/2017, 6:41 AM

## 2017-04-16 NOTE — Plan of Care (Signed)
  Progressing Clinical Measurements: Signs and symptoms of infection will decrease 04/16/2017 0924 - Progressing by Etheleen Nicks, RN Note Remains afebrile Clinical Measurements: Ability to maintain a body temperature in the normal range will improve 04/16/2017 0924 - Progressing by Etheleen Nicks, RN Note Remains afebrile    Not Progressing Clinical Measurements: Will remain free from infection 04/16/2017 3128 - Not Progressing by Etheleen Nicks, RN Note Remains on Tamiflu and IV antibiotics Diagnostic test results will improve 04/16/2017 0924 - Not Progressing by Etheleen Nicks, RN Note Chloride 113 K 3.8, HGB 10.2/30.5  Respiratory complications will improve 04/16/2017 1188 - Not Progressing by Etheleen Nicks, RN Note Lungs diminished with wheezes bil

## 2017-04-16 NOTE — Progress Notes (Signed)
Multiple complaints this am, complaining of cough (medication not due at this time), complaining of her head hurting still after 1 norco, 1 more norco given per md orders.  Pt wants her isolation precautions discontinued for her stools, stating she is not having diarrhea anymore.  Explained to pt the need for a stool sample so that we can determine if precautions are needed or not.  Pt upset because she said she told RN yesterday she was not having diarrhea and she said RN was to get precautions discontinued.  Education given to pt re stool protocol.  Will continue to monitor.

## 2017-04-17 DIAGNOSIS — I2510 Atherosclerotic heart disease of native coronary artery without angina pectoris: Secondary | ICD-10-CM | POA: Diagnosis not present

## 2017-04-18 ENCOUNTER — Other Ambulatory Visit: Payer: Self-pay

## 2017-04-18 DIAGNOSIS — I2510 Atherosclerotic heart disease of native coronary artery without angina pectoris: Secondary | ICD-10-CM | POA: Diagnosis not present

## 2017-04-18 NOTE — Patient Outreach (Signed)
[facility] Care Management  04/18/2017  Marie Clarke 12-12-1970 [id number]   Referral Date: 04/18/17 Referral Source: Human Report Date of Admission: 04/14/17 Diagnosis: Pneumonia and Flu Date of Discharge: 04/16/17 Facility: [facility]: [facility] attempt # 1 Spoke with patient.  She is able to verify HIPAA.  Patient reports she is doing ok.  She still has some congestion.  She reports that she was in the hospital for flu and pneumonia. She reports that she is still on antibiotics.  Discussed with patient signs of worsening flu/pneumonia.  She verbalized understanding.    Social: Patient lives with aunt and uncle.  Patient reports that she is independent with all aspects of care and utilizes [facility] transportation for appointments.   Conditions: Patient recently hospitalized for flu and pneumonia.  She states that she is getting better. Patient adds she has DM, HF, Bipolar and GERD.  Patient denies any problems with her sugars or heart failure.  Medications: Patient able to review medications and no discrepancies.    Appointments: Patient has appointment with primary care physician on Tuesday and will be using [facility] transport.    Consent: RN CM reviewed [facility] services with patient. Patient declined services at this time.   Plan: RN CM will send letter and brochure for future reference. RN CM will close case and notify care management assistant of case status   Jone Baseman, RN, MSN [facility] Care Management Coordinator Direct Line [phone] Toll Free: [phone]  Fax: [phone]

## 2017-04-19 DIAGNOSIS — I2510 Atherosclerotic heart disease of native coronary artery without angina pectoris: Secondary | ICD-10-CM | POA: Diagnosis not present

## 2017-04-19 LAB — CULTURE, BLOOD (ROUTINE X 2)
CULTURE: NO GROWTH
Culture: NO GROWTH
SPECIAL REQUESTS: ADEQUATE

## 2017-04-20 DIAGNOSIS — I2510 Atherosclerotic heart disease of native coronary artery without angina pectoris: Secondary | ICD-10-CM | POA: Diagnosis not present

## 2017-04-21 DIAGNOSIS — I2510 Atherosclerotic heart disease of native coronary artery without angina pectoris: Secondary | ICD-10-CM | POA: Diagnosis not present

## 2017-04-22 DIAGNOSIS — I2510 Atherosclerotic heart disease of native coronary artery without angina pectoris: Secondary | ICD-10-CM | POA: Diagnosis not present

## 2017-04-23 DIAGNOSIS — I2510 Atherosclerotic heart disease of native coronary artery without angina pectoris: Secondary | ICD-10-CM | POA: Diagnosis not present

## 2017-04-23 DIAGNOSIS — I509 Heart failure, unspecified: Secondary | ICD-10-CM | POA: Diagnosis not present

## 2017-04-23 DIAGNOSIS — J101 Influenza due to other identified influenza virus with other respiratory manifestations: Secondary | ICD-10-CM | POA: Diagnosis not present

## 2017-04-23 DIAGNOSIS — J189 Pneumonia, unspecified organism: Secondary | ICD-10-CM | POA: Diagnosis not present

## 2017-04-23 DIAGNOSIS — F17200 Nicotine dependence, unspecified, uncomplicated: Secondary | ICD-10-CM | POA: Diagnosis not present

## 2017-04-24 DIAGNOSIS — I2510 Atherosclerotic heart disease of native coronary artery without angina pectoris: Secondary | ICD-10-CM | POA: Diagnosis not present

## 2017-04-25 DIAGNOSIS — I2510 Atherosclerotic heart disease of native coronary artery without angina pectoris: Secondary | ICD-10-CM | POA: Diagnosis not present

## 2017-04-26 DIAGNOSIS — I2510 Atherosclerotic heart disease of native coronary artery without angina pectoris: Secondary | ICD-10-CM | POA: Diagnosis not present

## 2017-04-27 DIAGNOSIS — I2510 Atherosclerotic heart disease of native coronary artery without angina pectoris: Secondary | ICD-10-CM | POA: Diagnosis not present

## 2017-04-28 DIAGNOSIS — I2510 Atherosclerotic heart disease of native coronary artery without angina pectoris: Secondary | ICD-10-CM | POA: Diagnosis not present

## 2017-04-29 DIAGNOSIS — I2510 Atherosclerotic heart disease of native coronary artery without angina pectoris: Secondary | ICD-10-CM | POA: Diagnosis not present

## 2017-04-30 DIAGNOSIS — I2510 Atherosclerotic heart disease of native coronary artery without angina pectoris: Secondary | ICD-10-CM | POA: Diagnosis not present

## 2017-05-01 DIAGNOSIS — I2510 Atherosclerotic heart disease of native coronary artery without angina pectoris: Secondary | ICD-10-CM | POA: Diagnosis not present

## 2017-05-02 DIAGNOSIS — I2510 Atherosclerotic heart disease of native coronary artery without angina pectoris: Secondary | ICD-10-CM | POA: Diagnosis not present

## 2017-05-03 DIAGNOSIS — I2510 Atherosclerotic heart disease of native coronary artery without angina pectoris: Secondary | ICD-10-CM | POA: Diagnosis not present

## 2017-05-04 DIAGNOSIS — I2510 Atherosclerotic heart disease of native coronary artery without angina pectoris: Secondary | ICD-10-CM | POA: Diagnosis not present

## 2017-05-05 DIAGNOSIS — I2510 Atherosclerotic heart disease of native coronary artery without angina pectoris: Secondary | ICD-10-CM | POA: Diagnosis not present

## 2017-05-06 DIAGNOSIS — I2510 Atherosclerotic heart disease of native coronary artery without angina pectoris: Secondary | ICD-10-CM | POA: Diagnosis not present

## 2017-05-07 DIAGNOSIS — S63501A Unspecified sprain of right wrist, initial encounter: Secondary | ICD-10-CM | POA: Diagnosis not present

## 2017-05-07 DIAGNOSIS — I2510 Atherosclerotic heart disease of native coronary artery without angina pectoris: Secondary | ICD-10-CM | POA: Diagnosis not present

## 2017-05-07 DIAGNOSIS — M797 Fibromyalgia: Secondary | ICD-10-CM | POA: Diagnosis not present

## 2017-05-07 DIAGNOSIS — R262 Difficulty in walking, not elsewhere classified: Secondary | ICD-10-CM | POA: Diagnosis not present

## 2017-05-08 DIAGNOSIS — I2510 Atherosclerotic heart disease of native coronary artery without angina pectoris: Secondary | ICD-10-CM | POA: Diagnosis not present

## 2017-05-09 DIAGNOSIS — I2510 Atherosclerotic heart disease of native coronary artery without angina pectoris: Secondary | ICD-10-CM | POA: Diagnosis not present

## 2017-05-09 DIAGNOSIS — G4733 Obstructive sleep apnea (adult) (pediatric): Secondary | ICD-10-CM | POA: Diagnosis not present

## 2017-05-10 DIAGNOSIS — I2510 Atherosclerotic heart disease of native coronary artery without angina pectoris: Secondary | ICD-10-CM | POA: Diagnosis not present

## 2017-05-11 DIAGNOSIS — I2510 Atherosclerotic heart disease of native coronary artery without angina pectoris: Secondary | ICD-10-CM | POA: Diagnosis not present

## 2017-05-12 DIAGNOSIS — I2510 Atherosclerotic heart disease of native coronary artery without angina pectoris: Secondary | ICD-10-CM | POA: Diagnosis not present

## 2017-05-13 DIAGNOSIS — G4733 Obstructive sleep apnea (adult) (pediatric): Secondary | ICD-10-CM | POA: Diagnosis not present

## 2017-05-13 DIAGNOSIS — I2510 Atherosclerotic heart disease of native coronary artery without angina pectoris: Secondary | ICD-10-CM | POA: Diagnosis not present

## 2017-05-14 DIAGNOSIS — I2510 Atherosclerotic heart disease of native coronary artery without angina pectoris: Secondary | ICD-10-CM | POA: Diagnosis not present

## 2017-05-14 DIAGNOSIS — I10 Essential (primary) hypertension: Secondary | ICD-10-CM | POA: Diagnosis not present

## 2017-05-14 DIAGNOSIS — S63501A Unspecified sprain of right wrist, initial encounter: Secondary | ICD-10-CM | POA: Diagnosis not present

## 2017-05-14 DIAGNOSIS — N3946 Mixed incontinence: Secondary | ICD-10-CM | POA: Diagnosis not present

## 2017-05-14 DIAGNOSIS — R6889 Other general symptoms and signs: Secondary | ICD-10-CM | POA: Diagnosis not present

## 2017-05-15 DIAGNOSIS — I2510 Atherosclerotic heart disease of native coronary artery without angina pectoris: Secondary | ICD-10-CM | POA: Diagnosis not present

## 2017-05-15 DIAGNOSIS — G4733 Obstructive sleep apnea (adult) (pediatric): Secondary | ICD-10-CM | POA: Diagnosis not present

## 2017-05-16 DIAGNOSIS — R6889 Other general symptoms and signs: Secondary | ICD-10-CM | POA: Diagnosis not present

## 2017-05-16 DIAGNOSIS — S66311A Strain of extensor muscle, fascia and tendon of left index finger at wrist and hand level, initial encounter: Secondary | ICD-10-CM | POA: Insufficient documentation

## 2017-05-16 DIAGNOSIS — S66912A Strain of unspecified muscle, fascia and tendon at wrist and hand level, left hand, initial encounter: Secondary | ICD-10-CM | POA: Diagnosis not present

## 2017-05-16 DIAGNOSIS — I2510 Atherosclerotic heart disease of native coronary artery without angina pectoris: Secondary | ICD-10-CM | POA: Diagnosis not present

## 2017-05-17 DIAGNOSIS — I2510 Atherosclerotic heart disease of native coronary artery without angina pectoris: Secondary | ICD-10-CM | POA: Diagnosis not present

## 2017-05-18 DIAGNOSIS — I2510 Atherosclerotic heart disease of native coronary artery without angina pectoris: Secondary | ICD-10-CM | POA: Diagnosis not present

## 2017-05-19 DIAGNOSIS — I2510 Atherosclerotic heart disease of native coronary artery without angina pectoris: Secondary | ICD-10-CM | POA: Diagnosis not present

## 2017-05-20 DIAGNOSIS — I2510 Atherosclerotic heart disease of native coronary artery without angina pectoris: Secondary | ICD-10-CM | POA: Diagnosis not present

## 2017-05-21 DIAGNOSIS — I2510 Atherosclerotic heart disease of native coronary artery without angina pectoris: Secondary | ICD-10-CM | POA: Diagnosis not present

## 2017-05-22 DIAGNOSIS — I2510 Atherosclerotic heart disease of native coronary artery without angina pectoris: Secondary | ICD-10-CM | POA: Diagnosis not present

## 2017-05-23 DIAGNOSIS — I2510 Atherosclerotic heart disease of native coronary artery without angina pectoris: Secondary | ICD-10-CM | POA: Diagnosis not present

## 2017-05-24 DIAGNOSIS — I2510 Atherosclerotic heart disease of native coronary artery without angina pectoris: Secondary | ICD-10-CM | POA: Diagnosis not present

## 2017-05-25 DIAGNOSIS — I2510 Atherosclerotic heart disease of native coronary artery without angina pectoris: Secondary | ICD-10-CM | POA: Diagnosis not present

## 2017-05-26 DIAGNOSIS — I2510 Atherosclerotic heart disease of native coronary artery without angina pectoris: Secondary | ICD-10-CM | POA: Diagnosis not present

## 2017-05-27 DIAGNOSIS — R6889 Other general symptoms and signs: Secondary | ICD-10-CM | POA: Diagnosis not present

## 2017-05-27 DIAGNOSIS — S66912A Strain of unspecified muscle, fascia and tendon at wrist and hand level, left hand, initial encounter: Secondary | ICD-10-CM | POA: Diagnosis not present

## 2017-05-27 DIAGNOSIS — I2510 Atherosclerotic heart disease of native coronary artery without angina pectoris: Secondary | ICD-10-CM | POA: Diagnosis not present

## 2017-05-28 DIAGNOSIS — I2510 Atherosclerotic heart disease of native coronary artery without angina pectoris: Secondary | ICD-10-CM | POA: Diagnosis not present

## 2017-05-29 DIAGNOSIS — I2510 Atherosclerotic heart disease of native coronary artery without angina pectoris: Secondary | ICD-10-CM | POA: Diagnosis not present

## 2017-05-30 DIAGNOSIS — I2510 Atherosclerotic heart disease of native coronary artery without angina pectoris: Secondary | ICD-10-CM | POA: Diagnosis not present

## 2017-05-31 DIAGNOSIS — I2510 Atherosclerotic heart disease of native coronary artery without angina pectoris: Secondary | ICD-10-CM | POA: Diagnosis not present

## 2017-06-01 DIAGNOSIS — I2510 Atherosclerotic heart disease of native coronary artery without angina pectoris: Secondary | ICD-10-CM | POA: Diagnosis not present

## 2017-06-02 DIAGNOSIS — I2510 Atherosclerotic heart disease of native coronary artery without angina pectoris: Secondary | ICD-10-CM | POA: Diagnosis not present

## 2017-06-03 DIAGNOSIS — I2510 Atherosclerotic heart disease of native coronary artery without angina pectoris: Secondary | ICD-10-CM | POA: Diagnosis not present

## 2017-06-04 ENCOUNTER — Ambulatory Visit: Payer: Medicare HMO | Admitting: Psychiatry

## 2017-06-04 DIAGNOSIS — I2510 Atherosclerotic heart disease of native coronary artery without angina pectoris: Secondary | ICD-10-CM | POA: Diagnosis not present

## 2017-06-04 DIAGNOSIS — N3941 Urge incontinence: Secondary | ICD-10-CM | POA: Diagnosis not present

## 2017-06-05 DIAGNOSIS — I2510 Atherosclerotic heart disease of native coronary artery without angina pectoris: Secondary | ICD-10-CM | POA: Diagnosis not present

## 2017-06-06 DIAGNOSIS — I2510 Atherosclerotic heart disease of native coronary artery without angina pectoris: Secondary | ICD-10-CM | POA: Diagnosis not present

## 2017-06-07 DIAGNOSIS — I2510 Atherosclerotic heart disease of native coronary artery without angina pectoris: Secondary | ICD-10-CM | POA: Diagnosis not present

## 2017-06-08 DIAGNOSIS — I2510 Atherosclerotic heart disease of native coronary artery without angina pectoris: Secondary | ICD-10-CM | POA: Diagnosis not present

## 2017-06-09 DIAGNOSIS — I2510 Atherosclerotic heart disease of native coronary artery without angina pectoris: Secondary | ICD-10-CM | POA: Diagnosis not present

## 2017-06-10 DIAGNOSIS — I2510 Atherosclerotic heart disease of native coronary artery without angina pectoris: Secondary | ICD-10-CM | POA: Diagnosis not present

## 2017-06-11 DIAGNOSIS — I2510 Atherosclerotic heart disease of native coronary artery without angina pectoris: Secondary | ICD-10-CM | POA: Diagnosis not present

## 2017-06-12 DIAGNOSIS — G4733 Obstructive sleep apnea (adult) (pediatric): Secondary | ICD-10-CM | POA: Diagnosis not present

## 2017-06-12 DIAGNOSIS — I2510 Atherosclerotic heart disease of native coronary artery without angina pectoris: Secondary | ICD-10-CM | POA: Diagnosis not present

## 2017-06-13 DIAGNOSIS — I2510 Atherosclerotic heart disease of native coronary artery without angina pectoris: Secondary | ICD-10-CM | POA: Diagnosis not present

## 2017-06-14 DIAGNOSIS — I2510 Atherosclerotic heart disease of native coronary artery without angina pectoris: Secondary | ICD-10-CM | POA: Diagnosis not present

## 2017-06-15 DIAGNOSIS — I2510 Atherosclerotic heart disease of native coronary artery without angina pectoris: Secondary | ICD-10-CM | POA: Diagnosis not present

## 2017-06-16 DIAGNOSIS — I2510 Atherosclerotic heart disease of native coronary artery without angina pectoris: Secondary | ICD-10-CM | POA: Diagnosis not present

## 2017-06-17 DIAGNOSIS — I2510 Atherosclerotic heart disease of native coronary artery without angina pectoris: Secondary | ICD-10-CM | POA: Diagnosis not present

## 2017-06-18 DIAGNOSIS — I2510 Atherosclerotic heart disease of native coronary artery without angina pectoris: Secondary | ICD-10-CM | POA: Diagnosis not present

## 2017-06-19 DIAGNOSIS — I2510 Atherosclerotic heart disease of native coronary artery without angina pectoris: Secondary | ICD-10-CM | POA: Diagnosis not present

## 2017-06-19 DIAGNOSIS — E042 Nontoxic multinodular goiter: Secondary | ICD-10-CM | POA: Diagnosis not present

## 2017-06-19 DIAGNOSIS — R6889 Other general symptoms and signs: Secondary | ICD-10-CM | POA: Diagnosis not present

## 2017-06-19 DIAGNOSIS — E279 Disorder of adrenal gland, unspecified: Secondary | ICD-10-CM | POA: Diagnosis not present

## 2017-06-19 DIAGNOSIS — E2740 Unspecified adrenocortical insufficiency: Secondary | ICD-10-CM | POA: Diagnosis not present

## 2017-06-20 DIAGNOSIS — I2510 Atherosclerotic heart disease of native coronary artery without angina pectoris: Secondary | ICD-10-CM | POA: Diagnosis not present

## 2017-06-21 DIAGNOSIS — I2510 Atherosclerotic heart disease of native coronary artery without angina pectoris: Secondary | ICD-10-CM | POA: Diagnosis not present

## 2017-06-22 DIAGNOSIS — I2510 Atherosclerotic heart disease of native coronary artery without angina pectoris: Secondary | ICD-10-CM | POA: Diagnosis not present

## 2017-06-23 DIAGNOSIS — I2510 Atherosclerotic heart disease of native coronary artery without angina pectoris: Secondary | ICD-10-CM | POA: Diagnosis not present

## 2017-06-24 DIAGNOSIS — I2510 Atherosclerotic heart disease of native coronary artery without angina pectoris: Secondary | ICD-10-CM | POA: Diagnosis not present

## 2017-06-25 DIAGNOSIS — I10 Essential (primary) hypertension: Secondary | ICD-10-CM | POA: Diagnosis not present

## 2017-06-25 DIAGNOSIS — E039 Hypothyroidism, unspecified: Secondary | ICD-10-CM | POA: Diagnosis not present

## 2017-06-25 DIAGNOSIS — Z79899 Other long term (current) drug therapy: Secondary | ICD-10-CM | POA: Diagnosis not present

## 2017-06-25 DIAGNOSIS — N183 Chronic kidney disease, stage 3 (moderate): Secondary | ICD-10-CM | POA: Diagnosis not present

## 2017-06-25 DIAGNOSIS — K439 Ventral hernia without obstruction or gangrene: Secondary | ICD-10-CM | POA: Diagnosis not present

## 2017-06-25 DIAGNOSIS — Z7984 Long term (current) use of oral hypoglycemic drugs: Secondary | ICD-10-CM | POA: Diagnosis not present

## 2017-06-25 DIAGNOSIS — Z882 Allergy status to sulfonamides status: Secondary | ICD-10-CM | POA: Diagnosis not present

## 2017-06-25 DIAGNOSIS — Z7951 Long term (current) use of inhaled steroids: Secondary | ICD-10-CM | POA: Diagnosis not present

## 2017-06-25 DIAGNOSIS — F319 Bipolar disorder, unspecified: Secondary | ICD-10-CM | POA: Diagnosis not present

## 2017-06-25 DIAGNOSIS — Z881 Allergy status to other antibiotic agents status: Secondary | ICD-10-CM | POA: Diagnosis not present

## 2017-06-25 DIAGNOSIS — I2510 Atherosclerotic heart disease of native coronary artery without angina pectoris: Secondary | ICD-10-CM | POA: Diagnosis not present

## 2017-06-25 DIAGNOSIS — Z888 Allergy status to other drugs, medicaments and biological substances status: Secondary | ICD-10-CM | POA: Diagnosis not present

## 2017-06-25 DIAGNOSIS — J449 Chronic obstructive pulmonary disease, unspecified: Secondary | ICD-10-CM | POA: Diagnosis not present

## 2017-06-25 DIAGNOSIS — K219 Gastro-esophageal reflux disease without esophagitis: Secondary | ICD-10-CM | POA: Diagnosis not present

## 2017-06-25 DIAGNOSIS — E118 Type 2 diabetes mellitus with unspecified complications: Secondary | ICD-10-CM | POA: Diagnosis not present

## 2017-06-25 DIAGNOSIS — K623 Rectal prolapse: Secondary | ICD-10-CM | POA: Diagnosis not present

## 2017-06-25 DIAGNOSIS — G4730 Sleep apnea, unspecified: Secondary | ICD-10-CM | POA: Diagnosis not present

## 2017-06-25 DIAGNOSIS — I509 Heart failure, unspecified: Secondary | ICD-10-CM | POA: Diagnosis not present

## 2017-06-25 DIAGNOSIS — Z880 Allergy status to penicillin: Secondary | ICD-10-CM | POA: Diagnosis not present

## 2017-06-26 DIAGNOSIS — R000 Tachycardia, unspecified: Secondary | ICD-10-CM | POA: Diagnosis not present

## 2017-06-26 DIAGNOSIS — I509 Heart failure, unspecified: Secondary | ICD-10-CM | POA: Diagnosis not present

## 2017-06-26 DIAGNOSIS — R011 Cardiac murmur, unspecified: Secondary | ICD-10-CM | POA: Diagnosis not present

## 2017-06-26 DIAGNOSIS — N182 Chronic kidney disease, stage 2 (mild): Secondary | ICD-10-CM | POA: Diagnosis not present

## 2017-06-26 DIAGNOSIS — F17200 Nicotine dependence, unspecified, uncomplicated: Secondary | ICD-10-CM | POA: Diagnosis not present

## 2017-06-26 DIAGNOSIS — I208 Other forms of angina pectoris: Secondary | ICD-10-CM | POA: Diagnosis not present

## 2017-06-26 DIAGNOSIS — R0602 Shortness of breath: Secondary | ICD-10-CM | POA: Diagnosis not present

## 2017-06-26 DIAGNOSIS — R6889 Other general symptoms and signs: Secondary | ICD-10-CM | POA: Diagnosis not present

## 2017-06-26 DIAGNOSIS — I2510 Atherosclerotic heart disease of native coronary artery without angina pectoris: Secondary | ICD-10-CM | POA: Diagnosis not present

## 2017-06-26 DIAGNOSIS — G4733 Obstructive sleep apnea (adult) (pediatric): Secondary | ICD-10-CM | POA: Diagnosis not present

## 2017-06-26 DIAGNOSIS — R600 Localized edema: Secondary | ICD-10-CM | POA: Diagnosis not present

## 2017-06-27 DIAGNOSIS — I2510 Atherosclerotic heart disease of native coronary artery without angina pectoris: Secondary | ICD-10-CM | POA: Diagnosis not present

## 2017-06-28 DIAGNOSIS — I2510 Atherosclerotic heart disease of native coronary artery without angina pectoris: Secondary | ICD-10-CM | POA: Diagnosis not present

## 2017-06-29 DIAGNOSIS — I2510 Atherosclerotic heart disease of native coronary artery without angina pectoris: Secondary | ICD-10-CM | POA: Diagnosis not present

## 2017-06-30 DIAGNOSIS — I2510 Atherosclerotic heart disease of native coronary artery without angina pectoris: Secondary | ICD-10-CM | POA: Diagnosis not present

## 2017-07-01 ENCOUNTER — Ambulatory Visit: Payer: Medicare HMO | Admitting: Nurse Practitioner

## 2017-07-01 DIAGNOSIS — I2510 Atherosclerotic heart disease of native coronary artery without angina pectoris: Secondary | ICD-10-CM | POA: Diagnosis not present

## 2017-07-02 DIAGNOSIS — I2510 Atherosclerotic heart disease of native coronary artery without angina pectoris: Secondary | ICD-10-CM | POA: Diagnosis not present

## 2017-07-03 DIAGNOSIS — I2510 Atherosclerotic heart disease of native coronary artery without angina pectoris: Secondary | ICD-10-CM | POA: Diagnosis not present

## 2017-07-04 DIAGNOSIS — I2510 Atherosclerotic heart disease of native coronary artery without angina pectoris: Secondary | ICD-10-CM | POA: Diagnosis not present

## 2017-07-05 DIAGNOSIS — I2510 Atherosclerotic heart disease of native coronary artery without angina pectoris: Secondary | ICD-10-CM | POA: Diagnosis not present

## 2017-07-06 DIAGNOSIS — I2510 Atherosclerotic heart disease of native coronary artery without angina pectoris: Secondary | ICD-10-CM | POA: Diagnosis not present

## 2017-07-07 DIAGNOSIS — I2510 Atherosclerotic heart disease of native coronary artery without angina pectoris: Secondary | ICD-10-CM | POA: Diagnosis not present

## 2017-07-08 ENCOUNTER — Encounter: Payer: Self-pay | Admitting: Emergency Medicine

## 2017-07-08 ENCOUNTER — Emergency Department: Payer: Medicare HMO

## 2017-07-08 ENCOUNTER — Other Ambulatory Visit: Payer: Self-pay

## 2017-07-08 ENCOUNTER — Emergency Department
Admission: EM | Admit: 2017-07-08 | Discharge: 2017-07-08 | Disposition: A | Discharge status: Home / Self Care | Payer: Medicare HMO | Attending: Emergency Medicine | Admitting: Emergency Medicine

## 2017-07-08 DIAGNOSIS — M542 Cervicalgia: Secondary | ICD-10-CM | POA: Diagnosis not present

## 2017-07-08 DIAGNOSIS — Z7982 Long term (current) use of aspirin: Secondary | ICD-10-CM | POA: Diagnosis not present

## 2017-07-08 DIAGNOSIS — E039 Hypothyroidism, unspecified: Secondary | ICD-10-CM | POA: Insufficient documentation

## 2017-07-08 DIAGNOSIS — Z79899 Other long term (current) drug therapy: Secondary | ICD-10-CM | POA: Insufficient documentation

## 2017-07-08 DIAGNOSIS — I130 Hypertensive heart and chronic kidney disease with heart failure and stage 1 through stage 4 chronic kidney disease, or unspecified chronic kidney disease: Secondary | ICD-10-CM | POA: Insufficient documentation

## 2017-07-08 DIAGNOSIS — M436 Torticollis: Secondary | ICD-10-CM

## 2017-07-08 DIAGNOSIS — Z87891 Personal history of nicotine dependence: Secondary | ICD-10-CM | POA: Insufficient documentation

## 2017-07-08 DIAGNOSIS — Z8673 Personal history of transient ischemic attack (TIA), and cerebral infarction without residual deficits: Secondary | ICD-10-CM | POA: Diagnosis not present

## 2017-07-08 DIAGNOSIS — I509 Heart failure, unspecified: Secondary | ICD-10-CM | POA: Diagnosis not present

## 2017-07-08 DIAGNOSIS — J45909 Unspecified asthma, uncomplicated: Secondary | ICD-10-CM | POA: Diagnosis not present

## 2017-07-08 DIAGNOSIS — I2510 Atherosclerotic heart disease of native coronary artery without angina pectoris: Secondary | ICD-10-CM | POA: Diagnosis not present

## 2017-07-08 DIAGNOSIS — N183 Chronic kidney disease, stage 3 (moderate): Secondary | ICD-10-CM | POA: Insufficient documentation

## 2017-07-08 DIAGNOSIS — Z7984 Long term (current) use of oral hypoglycemic drugs: Secondary | ICD-10-CM | POA: Insufficient documentation

## 2017-07-08 MED ORDER — METHOCARBAMOL 500 MG PO TABS
1000.0000 mg | ORAL_TABLET | Freq: Once | ORAL | Status: AC
Start: 2017-07-08 — End: 2017-07-08
  Administered 2017-07-08: 1000 mg via ORAL
  Filled 2017-07-08: qty 2

## 2017-07-08 MED ORDER — METHOCARBAMOL 500 MG PO TABS: ORAL_TABLET | ORAL | 0 refills | Status: DC

## 2017-07-08 MED ORDER — OXYCODONE-ACETAMINOPHEN 5-325 MG PO TABS
1.0000 | ORAL_TABLET | Freq: Once | ORAL | Status: AC
  Administered 2017-07-08: 1 via ORAL
  Filled 2017-07-08: qty 1

## 2017-07-08 MED ORDER — HYDROCODONE-ACETAMINOPHEN 5-325 MG PO TABS: 1.0000 | ORAL_TABLET | Freq: Four times a day (QID) | ORAL | 0 refills | Status: DC | PRN

## 2017-07-08 NOTE — ED Notes (Signed)
First Nurse Note:  Patient tearful, rubbing left shoulder area.  States she has had pain in Left shoulder into neck and down left arm X 3 days.  Here this AM because pain is worsening.  Denies injury.

## 2017-07-08 NOTE — ED Triage Notes (Signed)
[place] for flex at this time per dr siadecki.

## 2017-07-08 NOTE — ED Provider Notes (Signed)
[facility] Emergency Department Provider Note  ____________________________________________   First MD Initiated Contact with Patient 07/08/17 [phone]     (approximate)  I have reviewed the triage vital signs and the nursing notes.   HISTORY  Chief Complaint Torticollis   HPI Marie Clarke is a 47 y.o. female is here with complaint of left neck and shoulder pain.  Patient states that she woke up with pain to her left neck area which is continued to get worse over the last 3 days.  Patient is taken over-the-counter medication without any relief.  She denies any injury to her neck or left shoulder.  She rates her pain as 10/10.  Past Medical History:  Diagnosis Date  . Anemia   . Anxiety   . Asthma   . Bipolar disorder ([place])   . CAD (coronary artery disease) unk  . CHF (congestive heart failure) ([place])   . COPD (chronic obstructive pulmonary disease) ([place])   . Depression unk  . Diabetes mellitus without complication ([place])   . Diabetes mellitus, type II ([place])   . Drug overdose   . GERD (gastroesophageal reflux disease)   . Headache   . Hyperlipidemia   . Hypertension   . Left leg pain 04/29/2014  . MI (myocardial infarction) ([place])   . Muscle ache 09/16/2014  . Osteoporosis   . Overactive bladder   . Pancreatitis unk  . Reflex sympathetic dystrophy   . Renal insufficiency   . Sleep apnea    pt reported on 2/6/7 she currently is not using CPAP  . Sleep apnea   . Stroke ([place])   . Thyroid disease   . TIA (transient ischemic attack) unk  . TIA (transient ischemic attack)     Patient Active Problem List   Diagnosis Date Noted  . Sepsis ([place]) 04/14/2017  . Osteopenia 04/03/2017  . Hypotension 09/17/2016  . Contusion of knee, left 10/12/2015  . Sprain of left ankle 10/12/2015  . Strain of left knee 10/12/2015  . Ankle instability 07/20/2015  . Incidental lung nodule 04/28/2015  . Chronic pain 03/28/2015  . Chronic low back pain (Location of Primary  Source of Pain) (Bilateral) (L>R) 03/28/2015  . Chronic lower extremity pain (Left) 03/28/2015  . Abdominal wound dehiscence 03/28/2015  . Encounter for pain management planning 03/28/2015  . Morbid obesity ([place]) 03/28/2015  . Abnormal CT scan, lumbar spine 03/28/2015  . Lumbar facet hypertrophy 03/28/2015  . Lumbar facet syndrome (Location of Primary Source of Pain) (Bilateral) (L>R) 03/28/2015  . Lumbar foraminal stenosis (Bilateral) (L5-S1) 03/28/2015  . Chronic ankle pain (Left) 03/28/2015  . CRPS (complex regional pain syndrome) type I of lower limb (left ankle) 03/28/2015  . Neurogenic pain 03/28/2015  . Neuropathic pain 03/28/2015  . Myofascial pain 03/28/2015  . History of suicide attempt 03/28/2015  . 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 Abnormal gait 12/15/2014  . Congestive heart failure ([place]) 11/15/2014  . Abdominal wall abscess 09/20/2014  . 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 Diabetes mellitus, type 2 ([place]) 08/13/2014  . Bipolar affective disorder ([place]) 08/13/2014  . Type 2 diabetes mellitus ([place]) 08/13/2014  . Rectal prolapse 08/09/2014  . Rectal bleeding 08/09/2014  . Rectal bleed 08/09/2014  . Affective bipolar disorder ([place]) 08/05/2014  . Arteriosclerosis of coronary artery 08/05/2014  . CCF (congestive cardiac failure) ([place]) 08/05/2014  . Chronic kidney disease 08/05/2014  . 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 Apnea, sleep 08/05/2014  . Temporary cerebral vascular dysfunction 08/05/2014  . Polypharmacy 04/29/2014  .  Other long term (current) drug therapy 04/29/2014  . 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 Chronic kidney disease, stage III (moderate) ([place]) 12/14/2013  . Controlled diabetes mellitus type II without complication ([place]) 21/22/4825  . Essential (primary) hypertension 12/03/2013  . Adult hypothyroidism 12/03/2013  . Controlled type 2 diabetes mellitus without complication ([place]) 00/37/0488    Past Surgical History:  Procedure Laterality  Date  . ABDOMINAL HYSTERECTOMY    . prolapse rectum surgery N/A July 2016  . TONSILLECTOMY      Prior to Admission medications   Medication Sig Start Date End Date Taking? Authorizing Provider  acetaminophen (TYLENOL) 325 MG tablet Take 1 tablet (325 mg total) by mouth every 6 (six) hours as needed for mild pain (or Fever >/= 101). 09/18/16   Gouru, Aruna, MD  albuterol (PROVENTIL HFA;VENTOLIN HFA) 108 (90 BASE) MCG/ACT inhaler Inhale 2 puffs into the lungs every 4 (four) hours as needed for wheezing or shortness of breath.    [provider]  aspirin EC 81 MG tablet Take 81 mg by mouth daily at 12 noon.    [provider]  atorvastatin (LIPITOR) 40 MG tablet Take 40 mg by mouth at bedtime.     [provider]  azelastine (ASTELIN) 0.1 % nasal spray Place 1 spray into both nostrils daily.    [provider]  bumetanide (BUMEX) 2 MG tablet Take 1 tablet (2 mg total) by mouth 2 (two) times daily. 09/20/16 09/20/17  Nicholes Mango, MD  busPIRone (BUSPAR) 15 MG tablet Take 1 tablet (15 mg total) by mouth 3 (three) times daily. 03/06/17   Ursula Alert, MD  gabapentin (NEURONTIN) 600 MG tablet Take 1,200 mg by mouth 3 (three) times daily.  04/19/16   [provider]  glipiZIDE (GLUCOTROL XL) 10 MG 24 hr tablet Take 10 mg by mouth.  03/20/16 01/26/17  [provider]  HYDROcodone-acetaminophen (NORCO/VICODIN) 5-325 MG tablet Take 1 tablet by mouth every 6 (six) hours as needed for moderate pain. 07/08/17   [name], PA-C  ibuprofen (ADVIL,MOTRIN) 800 MG tablet Take 800 mg by mouth every 8 (eight) hours as needed.    [provider]  KLOR-CON M20 20 MEQ tablet Take 20 mEq by mouth daily.  10/13/15   [provider]  lamoTRIgine (LAMICTAL) 200 MG tablet Take 1 tablet (200 mg total) by mouth daily. 03/06/17   Ursula Alert, MD  levothyroxine (SYNTHROID, LEVOTHROID) 200 MCG tablet Take 200 mcg by mouth daily.     [provider]   levothyroxine (SYNTHROID, LEVOTHROID) 25 MCG tablet Take 25 mcg by mouth daily before breakfast.  06/13/16 05/23/17  [provider]  methocarbamol (ROBAXIN) 500 MG tablet 1-2 tablets every 6 hours prn muscle spasms. 07/08/17   [name], PA-C  montelukast (SINGULAIR) 10 MG tablet Take 10 mg by mouth daily.     [provider]  ondansetron (ZOFRAN ODT) 4 MG disintegrating tablet Take 1 tablet (4 mg total) by mouth every 8 (eight) hours as needed for nausea or vomiting. 02/25/16   Earleen Newport, MD  QUEtiapine (SEROQUEL) 25 MG tablet Take 1 tablet (25 mg total) by mouth 2 (two) times daily. 03/06/17   Ursula Alert, MD  ranitidine (ZANTAC) 300 MG tablet Take 1 tablet (300 mg total) by mouth 2 (two) times daily. 11/30/14   Carrie Mew, MD  tiotropium (SPIRIVA) 18 MCG inhalation capsule Place 18 mcg into inhaler and inhale at bedtime.    [provider]  Allergies Diazepam; Sulfa antibiotics; Cephalexin; Ciprofloxacin; Flagyl [metronidazole]; Geodon [ziprasidone hcl]; Levofloxacin; Vancomycin; Penicillins; and Pregabalin  Family History  Problem Relation Age of Onset  . Diabetes Mellitus II Mother   . CAD Mother   . Sleep apnea Mother   . Osteoarthritis Mother   . Osteoporosis Mother   . Anxiety disorder Mother   . Depression Mother   . Bipolar disorder Mother   . Bipolar disorder Father   . Hypertension Father   . Depression Father   . Anxiety disorder Father   . Post-traumatic stress disorder Sister     Social History Social History   Tobacco Use  . Smoking status: Former Smoker    Packs/day: 1.00    Types: Cigarettes    Last attempt to quit: 06/12/2016    Years since quitting: 1.0  . Smokeless tobacco: Never Used  Substance Use Topics  . Alcohol use: No    Alcohol/week: 0.0 oz  . Drug use: No    Review of Systems Constitutional: No fever/chills Eyes: No visual changes. ENT: No sore throat. Cardiovascular: Denies chest  pain. Respiratory: Denies shortness of breath. Gastrointestinal:   No nausea, no vomiting. Musculoskeletal: Positive for cervical pain with radiation down the left arm.  Positive left trapezius muscle pain. Skin: Negative for rash. Neurological: Negative for headaches, focal weakness or numbness. ___________________________________________   PHYSICAL EXAM:  VITAL SIGNS: ED Triage Vitals  Enc Vitals Group     BP 07/08/17 0842 (!) 133/93     Pulse Rate 07/08/17 0842 95     Resp 07/08/17 0842 18     Temp 07/08/17 0842 98.4 F (36.9 C)     Temp Source 07/08/17 0842 Oral     SpO2 07/08/17 0842 98 %     Weight 07/08/17 0841 210 lb (95.3 kg)     Height 07/08/17 0841 5\' 4"  (1.626 m)     Head Circumference --      Peak Flow --      Pain Score 07/08/17 0841 10     Pain Loc --      Pain Edu? --      Excl. in [place]? --    Constitutional: Alert and oriented. Well appearing and in no acute distress. Eyes: Conjunctivae are normal. PERRL. EOMI. Head: Atraumatic. Nose: No congestion/rhinnorhea. Mouth/Throat: Mucous membranes are moist.  Oropharynx non-erythematous. Neck: No stridor.  Nontender cervical spine to palpation posteriorly.  Range of motion is restricted laterally especially to the left.  Marked tenderness on palpation of the left trapezius muscle.  No bruising or ecchymosis noted. Cardiovascular: Normal rate, regular rhythm. Grossly normal heart sounds.  Good peripheral circulation. Respiratory: Normal respiratory effort.  No retractions. Lungs CTAB. Musculoskeletal: On examination of the left shoulder there is no gross deformity no soft tissue swelling present.  There is marked tenderness on palpation of the left trapezius muscle.  Range of motion is decreased secondary to discomfort.  Skin is intact without abrasions or ecchymosis.  No soft tissue swelling present. Neurologic:  Normal speech and language. No gross focal neurologic deficits are appreciated. No gait instability. Skin:   Skin is warm, dry and intact. No rash noted. Psychiatric: Mood and affect are normal. Speech and behavior are normal.  ____________________________________________   LABS (all labs ordered are listed, but only abnormal results are displayed)  Labs Reviewed - No data to display  RADIOLOGY  ED MD interpretation:  Cervical spine x-ray shows degenerative disc disease.  Official radiology report(s): Dg Cervical Spine 2-3  Views  Result Date: 07/08/2017 CLINICAL DATA:  Left-sided neck pain. EXAM: CERVICAL SPINE - 2-3 VIEW COMPARISON:  Neck CT 06/25/2016 FINDINGS: There is no evidence of cervical spine fracture or prevertebral soft tissue swelling. Alignment is normal. Mild osteoarthritic changes at C3-C4, C5-C6 and C6-C7, with mild disc space narrowing and endplate remodeling. IMPRESSION: No evidence of fracture or alignment abnormality. Mild osteoarthritic changes at C3-C4, C5-C6 and C6-C7. Electronically Signed   By: Fidela Salisbury M.D.   On: 07/08/2017 09:55    ____________________________________________   PROCEDURES  Procedure(s) performed: None  Procedures  Critical Care performed: No  ____________________________________________   INITIAL IMPRESSION / ASSESSMENT AND PLAN / ED COURSE  As part of my medical decision making, I reviewed the following data within the electronic MEDICAL RECORD NUMBER Notes from prior ED visits and [place] Controlled Substance Database  Patient was given Percocet and Robaxin 1000 mg p.o.  Patient was reassured that x-rays were negative other than degenerative disc disease.  Patient was improving and was sleeping on her side at the time of discharge.  She is to follow-up with her PCP for any continued problems.  She was discharged with prescription for methocarbamol 1 or 2 tablets every 6 hours as needed for muscle spasms and Norco 1 every 6 hours as needed for pain.  She is encouraged to use warm moist compresses to her neck and shoulder as needed for  discomfort.  ____________________________________________   FINAL CLINICAL IMPRESSION(S) / ED DIAGNOSES  Final diagnoses:  Acute torticollis     ED Discharge Orders        Ordered    methocarbamol (ROBAXIN) 500 MG tablet     07/08/17 1042    HYDROcodone-acetaminophen (NORCO/VICODIN) 5-325 MG tablet  Every 6 hours PRN     07/08/17 1042       Note:  This document was prepared using Dragon voice recognition software and may include unintentional dictation errors.    [name], PA-C 07/08/17 1211    Delman Kitten, MD 07/08/17 [phone]

## 2017-07-08 NOTE — ED Notes (Signed)
See triage note  Presents with pain to neck..states pain started 3 days ago  No injury or fever  Having increased pain with movement

## 2017-07-08 NOTE — Discharge Instructions (Addendum)
Follow-up with your primary care provider.  Call make an appointment.  Continue taking medication as directed.  Robaxin 1 or 2 tablets every 6 hours as needed for muscle spasms.  Norco 1 every 6 hours as needed for moderate pain.  You may continue taking ibuprofen 3 tablets with food 3 times a day. Use warm moist compresses or ice to your neck as needed for discomfort.

## 2017-07-08 NOTE — ED Triage Notes (Signed)
Here for left sided neck pain that radiates into arm. Woke up with pain 3 days ago. It is constant in nature. Worse when turns head at all or moves in any direction.  Pain worse with extension of shoulder.  Does not remember injury.  tearful in triage from moving for EKG.

## 2017-07-09 ENCOUNTER — Telehealth: Payer: Self-pay

## 2017-07-09 DIAGNOSIS — I2510 Atherosclerotic heart disease of native coronary artery without angina pectoris: Secondary | ICD-10-CM | POA: Diagnosis not present

## 2017-07-09 NOTE — Telephone Encounter (Signed)
Please call pt to schedule appointment ASAP since she has not been seen in clinic for a long time . After an appointment is scheduled I will give her enough medication to last till her appointment. This is Dr.Faheem's patient.

## 2017-07-09 NOTE — Telephone Encounter (Signed)
pt came by office she told lea that she needed a refill on her medication. lea told her that she needed an appt because she had not been seen since jan. pt then told lea to leave a message with the nurse.  so lea came into my office and told me so i called the patient.  Pt was told again that she needed to come in for an appt to be seen.  Pt states that she can not because she having surgery on Friday.    Pt claims that she had not come to her appt because she had to go [facility].  Pt was asked if she went to [facility] everyday and she stated no pt was asked why she didn't make an appt before no in order to get medications she needed. Pt stated that she did not know.  Pt was told that I would send a message but that the doctor my just have the doctor doing your surgery give you your medications until you make an appt to be seen.  Pt was told that is she waits until June to be seen she would be consider a new pt because she would be 6 months without seeing doctor.

## 2017-07-10 DIAGNOSIS — I2510 Atherosclerotic heart disease of native coronary artery without angina pectoris: Secondary | ICD-10-CM | POA: Diagnosis not present

## 2017-07-11 ENCOUNTER — Ambulatory Visit: Payer: Medicare HMO | Admitting: Nurse Practitioner

## 2017-07-11 DIAGNOSIS — I2510 Atherosclerotic heart disease of native coronary artery without angina pectoris: Secondary | ICD-10-CM | POA: Diagnosis not present

## 2017-07-12 ENCOUNTER — Encounter: Payer: Self-pay | Admitting: *Deleted

## 2017-07-12 ENCOUNTER — Emergency Department
Admission: EM | Admit: 2017-07-12 | Discharge: 2017-07-13 | Disposition: A | Discharge status: Home / Self Care | Payer: Medicare HMO | Attending: Emergency Medicine | Admitting: Emergency Medicine

## 2017-07-12 ENCOUNTER — Other Ambulatory Visit: Payer: Self-pay

## 2017-07-12 ENCOUNTER — Emergency Department: Payer: Medicare HMO

## 2017-07-12 DIAGNOSIS — Z79899 Other long term (current) drug therapy: Secondary | ICD-10-CM | POA: Insufficient documentation

## 2017-07-12 DIAGNOSIS — M25512 Pain in left shoulder: Secondary | ICD-10-CM | POA: Diagnosis not present

## 2017-07-12 DIAGNOSIS — Z7984 Long term (current) use of oral hypoglycemic drugs: Secondary | ICD-10-CM | POA: Insufficient documentation

## 2017-07-12 DIAGNOSIS — Z7982 Long term (current) use of aspirin: Secondary | ICD-10-CM | POA: Insufficient documentation

## 2017-07-12 DIAGNOSIS — E785 Hyperlipidemia, unspecified: Secondary | ICD-10-CM | POA: Insufficient documentation

## 2017-07-12 DIAGNOSIS — I252 Old myocardial infarction: Secondary | ICD-10-CM | POA: Insufficient documentation

## 2017-07-12 DIAGNOSIS — Z8673 Personal history of transient ischemic attack (TIA), and cerebral infarction without residual deficits: Secondary | ICD-10-CM | POA: Insufficient documentation

## 2017-07-12 DIAGNOSIS — M62838 Other muscle spasm: Secondary | ICD-10-CM | POA: Diagnosis not present

## 2017-07-12 DIAGNOSIS — E119 Type 2 diabetes mellitus without complications: Secondary | ICD-10-CM | POA: Diagnosis not present

## 2017-07-12 DIAGNOSIS — I2510 Atherosclerotic heart disease of native coronary artery without angina pectoris: Secondary | ICD-10-CM | POA: Diagnosis not present

## 2017-07-12 DIAGNOSIS — Z87891 Personal history of nicotine dependence: Secondary | ICD-10-CM | POA: Insufficient documentation

## 2017-07-12 DIAGNOSIS — J449 Chronic obstructive pulmonary disease, unspecified: Secondary | ICD-10-CM | POA: Insufficient documentation

## 2017-07-12 MED ORDER — LORAZEPAM 1 MG PO TABS
1.0000 mg | ORAL_TABLET | Freq: Once | ORAL | Status: AC
  Administered 2017-07-12: 1 mg via ORAL
  Filled 2017-07-12: qty 1

## 2017-07-12 MED ORDER — IBUPROFEN 400 MG PO TABS
ORAL_TABLET | ORAL | Status: AC
  Filled 2017-07-12: qty 1

## 2017-07-12 MED ORDER — IBUPROFEN 400 MG PO TABS
400.0000 mg | ORAL_TABLET | Freq: Once | ORAL | Status: AC | PRN
  Administered 2017-07-12: 400 mg via ORAL

## 2017-07-12 MED ORDER — KETOROLAC TROMETHAMINE 30 MG/ML IJ SOLN
60.0000 mg | Freq: Once | INTRAMUSCULAR | Status: AC
  Administered 2017-07-12: 60 mg via INTRAMUSCULAR
  Filled 2017-07-12: qty 2

## 2017-07-12 MED ORDER — OXYCODONE-ACETAMINOPHEN 5-325 MG PO TABS
1.0000 | ORAL_TABLET | Freq: Once | ORAL | Status: AC
  Administered 2017-07-12: 1 via ORAL
  Filled 2017-07-12: qty 1

## 2017-07-12 NOTE — ED Provider Notes (Signed)
[facility] Emergency Department Provider Note   ____________________________________________   First MD Initiated Contact with Patient 07/12/17 2319     (approximate)  I have reviewed the triage vital signs and the nursing notes.   HISTORY  Chief Complaint Neck Pain    HPI Marie Clarke is a 47 y.o. female who returns to the ED from home with a chief complaint of nontraumatic left shoulder pain.  Patient was seen earlier this week for nontraumatic left neck pain, treated with Norco and methocarbamol.  Patient states the neck pain has improved but now complains of her left shoulder.  Describes sharp pain which radiates through her fingers, worse on movement.  Feels like her left forearm is swollen.  Denies fall/injury/trauma.  Denies associated headache, vision changes, chest pain, shortness of breath, abdominal pain, nausea, vomiting, extremity weakness, numbness or tingling.   Past Medical History:  Diagnosis Date  . Anemia   . Anxiety   . Asthma   . Bipolar disorder ([place])   . CAD (coronary artery disease) unk  . CHF (congestive heart failure) ([place])   . COPD (chronic obstructive pulmonary disease) ([place])   . Depression unk  . Diabetes mellitus without complication ([place])   . Diabetes mellitus, type II ([place])   . Drug overdose   . GERD (gastroesophageal reflux disease)   . Headache   . Hyperlipidemia   . Hypertension   . Left leg pain 04/29/2014  . MI (myocardial infarction) ([place])   . Muscle ache 09/16/2014  . Osteoporosis   . Overactive bladder   . Pancreatitis unk  . Reflex sympathetic dystrophy   . Renal insufficiency   . Sleep apnea    pt reported on 2/6/7 she currently is not using CPAP  . Sleep apnea   . Stroke ([place])   . Thyroid disease   . TIA (transient ischemic attack) unk  . TIA (transient ischemic attack)     Patient Active Problem List   Diagnosis Date Noted  . Sepsis ([place]) 04/14/2017  . Osteopenia 04/03/2017  . Hypotension  09/17/2016  . Contusion of knee, left 10/12/2015  . Sprain of left ankle 10/12/2015  . Strain of left knee 10/12/2015  . Ankle instability 07/20/2015  . Incidental lung nodule 04/28/2015  . Chronic pain 03/28/2015  . Chronic low back pain (Location of Primary Source of Pain) (Bilateral) (L>R) 03/28/2015  . Chronic lower extremity pain (Left) 03/28/2015  . Abdominal wound dehiscence 03/28/2015  . Encounter for pain management planning 03/28/2015  . Morbid obesity ([place]) 03/28/2015  . Abnormal CT scan, lumbar spine 03/28/2015  . Lumbar facet hypertrophy 03/28/2015  . Lumbar facet syndrome (Location of Primary Source of Pain) (Bilateral) (L>R) 03/28/2015  . Lumbar foraminal stenosis (Bilateral) (L5-S1) 03/28/2015  . Chronic ankle pain (Left) 03/28/2015  . CRPS (complex regional pain syndrome) type I of lower limb (left ankle) 03/28/2015  . Neurogenic pain 03/28/2015  . Neuropathic pain 03/28/2015  . Myofascial pain 03/28/2015  . History of suicide attempt 03/28/2015  . 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 Abnormal gait 12/15/2014  . Congestive heart failure ([place]) 11/15/2014  . Abdominal wall abscess 09/20/2014  . 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 Diabetes mellitus, type 2 ([place]) 08/13/2014  . Bipolar affective disorder ([place]) 08/13/2014  . Type 2 diabetes mellitus ([place]) 08/13/2014  . Rectal prolapse 08/09/2014  . Rectal bleeding 08/09/2014  . Rectal bleed 08/09/2014  . Affective bipolar disorder ([place]) 08/05/2014  . Arteriosclerosis of coronary artery 08/05/2014  . CCF (  congestive cardiac failure) ([place]) 08/05/2014  . Chronic kidney disease 08/05/2014  . 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 Apnea, sleep 08/05/2014  . Temporary cerebral vascular dysfunction 08/05/2014  . Polypharmacy 04/29/2014  . Other long term (current) drug therapy 04/29/2014  . 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 Chronic kidney disease, stage III (moderate) ([place]) 12/14/2013  . Controlled diabetes  mellitus type II without complication ([place]) 99/83/3825  . Essential (primary) hypertension 12/03/2013  . Adult hypothyroidism 12/03/2013  . Controlled type 2 diabetes mellitus without complication ([place]) 05/39/7673    Past Surgical History:  Procedure Laterality Date  . ABDOMINAL HYSTERECTOMY    . prolapse rectum surgery N/A July 2016  . TONSILLECTOMY      Prior to Admission medications   Medication Sig Start Date End Date Taking? Authorizing Provider  acetaminophen (TYLENOL) 325 MG tablet Take 1 tablet (325 mg total) by mouth every 6 (six) hours as needed for mild pain (or Fever >/= 101). 09/18/16   Gouru, Aruna, MD  albuterol (PROVENTIL HFA;VENTOLIN HFA) 108 (90 BASE) MCG/ACT inhaler Inhale 2 puffs into the lungs every 4 (four) hours as needed for wheezing or shortness of breath.    [provider]  aspirin EC 81 MG tablet Take 81 mg by mouth daily at 12 noon.    [provider]  atorvastatin (LIPITOR) 40 MG tablet Take 40 mg by mouth at bedtime.     [provider]  azelastine (ASTELIN) 0.1 % nasal spray Place 1 spray into both nostrils daily.    [provider]  bumetanide (BUMEX) 2 MG tablet Take 1 tablet (2 mg total) by mouth 2 (two) times daily. 09/20/16 09/20/17  Nicholes Mango, MD  busPIRone (BUSPAR) 15 MG tablet Take 1 tablet (15 mg total) by mouth 3 (three) times daily. 03/06/17   Ursula Alert, MD  gabapentin (NEURONTIN) 600 MG tablet Take 1,200 mg by mouth 3 (three) times daily.  04/19/16   [provider]  glipiZIDE (GLUCOTROL XL) 10 MG 24 hr tablet Take 10 mg by mouth.  03/20/16 01/26/17  [provider]  HYDROcodone-acetaminophen (NORCO/VICODIN) 5-325 MG tablet Take 1 tablet by mouth every 6 (six) hours as needed for moderate pain. 07/08/17   [name], PA-C  ibuprofen (ADVIL,MOTRIN) 800 MG tablet Take 800 mg by mouth every 8 (eight) hours as needed.    [provider]  KLOR-CON M20 20 MEQ tablet Take 20 mEq by mouth  daily.  10/13/15   [provider]  lamoTRIgine (LAMICTAL) 200 MG tablet Take 1 tablet (200 mg total) by mouth daily. 03/06/17   Ursula Alert, MD  levothyroxine (SYNTHROID, LEVOTHROID) 200 MCG tablet Take 200 mcg by mouth daily.     [provider]  levothyroxine (SYNTHROID, LEVOTHROID) 25 MCG tablet Take 25 mcg by mouth daily before breakfast.  06/13/16 05/23/17  [provider]  methocarbamol (ROBAXIN) 500 MG tablet 1-2 tablets every 6 hours prn muscle spasms. 07/08/17   [name], PA-C  montelukast (SINGULAIR) 10 MG tablet Take 10 mg by mouth daily.     [provider]  ondansetron (ZOFRAN ODT) 4 MG disintegrating tablet Take 1 tablet (4 mg total) by mouth every 8 (eight) hours as needed for nausea or vomiting. 02/25/16   Earleen Newport, MD  QUEtiapine (SEROQUEL) 25 MG tablet Take 1 tablet (25 mg total) by mouth 2 (two) times daily. 03/06/17   Ursula Alert, MD  ranitidine (ZANTAC) 300 MG tablet Take 1 tablet (300 mg total) by  mouth 2 (two) times daily. 11/30/14   Carrie Mew, MD  tiotropium (SPIRIVA) 18 MCG inhalation capsule Place 18 mcg into inhaler and inhale at bedtime.    [provider]    Allergies Diazepam; Sulfa antibiotics; Cephalexin; Ciprofloxacin; Flagyl [metronidazole]; Geodon [ziprasidone hcl]; Levofloxacin; Vancomycin; Penicillins; and Pregabalin  Family History  Problem Relation Age of Onset  . Diabetes Mellitus II Mother   . CAD Mother   . Sleep apnea Mother   . Osteoarthritis Mother   . Osteoporosis Mother   . Anxiety disorder Mother   . Depression Mother   . Bipolar disorder Mother   . Bipolar disorder Father   . Hypertension Father   . Depression Father   . Anxiety disorder Father   . Post-traumatic stress disorder Sister     Social History Social History   Tobacco Use  . Smoking status: Former Smoker    Packs/day: 1.00    Types: Cigarettes    Last attempt to quit: 06/12/2016    Years since  quitting: 1.0  . Smokeless tobacco: Never Used  Substance Use Topics  . Alcohol use: No    Alcohol/week: 0.0 oz  . Drug use: No    Review of Systems  Constitutional: No fever/chills Eyes: No visual changes. ENT: No sore throat. Cardiovascular: Denies chest pain. Respiratory: Denies shortness of breath. Gastrointestinal: No abdominal pain.  No nausea, no vomiting.  No diarrhea.  No constipation. Genitourinary: Negative for dysuria. Musculoskeletal: Positive for left shoulder pain.  Negative for back pain. Skin: Negative for rash. Neurological: Negative for headaches, focal weakness or numbness.   ____________________________________________   PHYSICAL EXAM:  VITAL SIGNS: ED Triage Vitals  Enc Vitals Group     BP 07/12/17 2120 (!) 132/91     Pulse Rate 07/12/17 2120 90     Resp 07/12/17 2120 20     Temp 07/12/17 2120 97.9 F (36.6 C)     Temp Source 07/12/17 2120 Oral     SpO2 07/12/17 2120 97 %     Weight --      Height --      Head Circumference --      Peak Flow --      Pain Score 07/12/17 2118 10     Pain Loc --      Pain Edu? --      Excl. in [place]? --     Constitutional: Alert and oriented. Well appearing and in no acute distress. Eyes: Conjunctivae are normal. PERRL. EOMI. Head: Atraumatic. Nose: No congestion/rhinnorhea. Mouth/Throat: Mucous membranes are moist.  Oropharynx non-erythematous. Neck: No stridor.  No cervical spine tenderness to palpation.  No carotid bruits.  Left trapezius muscle spasms. Cardiovascular: Normal rate, regular rhythm. Grossly normal heart sounds.  Good peripheral circulation. Respiratory: Normal respiratory effort.  No retractions. Lungs CTAB. Gastrointestinal: Soft and nontender. No distention. No abdominal bruits. No CVA tenderness. Musculoskeletal: Left shoulder, humerus and forearm tender to palpation.  No injury or deformity noted.  Bilateral forearms measure 31 cm.  No obvious swelling or warmth.  Left shoulder has limited  range of motion secondary to pain.  2+ radial pulse.  Brisk, less than 5-second capillary refill.  Symmetrically warm limbs without evidence for ischemia.  No joint effusions. Neurologic:  Normal speech and language. No gross focal neurologic deficits are appreciated. No gait instability. Skin:  Skin is warm, dry and intact. No rash noted. Psychiatric: Mood and affect are normal. Speech and behavior are normal.  ____________________________________________   LABS (  all labs ordered are listed, but only abnormal results are displayed)  Labs Reviewed - No data to display ____________________________________________  EKG  None ____________________________________________  RADIOLOGY  ED MD interpretation: No acute traumatic injuries  Official radiology report(s): Dg Shoulder Left  Result Date: 07/13/2017 CLINICAL DATA:  Acute onset of diffuse left shoulder pain. EXAM: LEFT SHOULDER - 2+ VIEW COMPARISON:  None. FINDINGS: There is no evidence of fracture or dislocation. The left humeral head is seated within the glenoid fossa. The acromioclavicular joint is unremarkable in appearance. No significant soft tissue abnormalities are seen. The visualized portions of the left lung are clear. IMPRESSION: No evidence of fracture or dislocation. Electronically Signed   By: Garald Balding M.D.   On: 07/13/2017 00:05    ____________________________________________   PROCEDURES  Procedure(s) performed: None  Procedures  Critical Care performed: No  ____________________________________________   INITIAL IMPRESSION / ASSESSMENT AND PLAN / ED COURSE  As part of my medical decision making, I reviewed the following data within the [place] notes reviewed and incorporated, Old chart reviewed, Radiograph reviewed and Notes from prior ED visits   47 year old female who returns to the ER now for nontraumatic left shoulder pain.  Differential diagnosis includes but is not  limited to musculoskeletal, bursitis, cervical radiculopathy, etc.  I personally reviewed patient's ED visits from 07/08/2017.  I reviewed her cervical spine x-ray results, her ED course as well as her discharge medications.  Will obtain shoulder x-rays per patient's request, administer 60 mg IM Toradol, oral Percocet and oral Ativan for muscle relaxation.  Will reassess.   Clinical Course as of Jul 14 127  Sat Jul 13, 2017  0127 Patient soundly asleep in no acute distress.  Updated her of x-ray imaging result.  Patient tells me she has an upcoming hernia surgery Monday morning.  For this reason, I will avoid placing patient on NSAIDs or prednisone.  Will discharge home on Percocet, Ativan and refer to orthopedics for follow-up.  Strict return precautions given.  Patient verbalizes understanding agrees with plan of care.   [JS]    Clinical Course User Index [JS] Paulette Blanch, MD     ____________________________________________   FINAL CLINICAL IMPRESSION(S) / ED DIAGNOSES  Final diagnoses:  Trapezius muscle spasm  Acute pain of left shoulder     ED Discharge Orders    None       Note:  This document was prepared using Dragon voice recognition software and may include unintentional dictation errors.    Paulette Blanch, MD 07/13/17 [phone]

## 2017-07-12 NOTE — ED Triage Notes (Signed)
Pt c/o pain in the left neck/ left shoulder that goes all the way through her fingers for 3-4 days. States seen here for the same this week, states pain is worse. Feels like she started having swelling in the left arm yesterday. Strong radial/brachial pulse.  Denies injury. States she has been taking the robaxin and hydrocoone (now out of this rx) but nothing is helping the pain.

## 2017-07-13 DIAGNOSIS — I2510 Atherosclerotic heart disease of native coronary artery without angina pectoris: Secondary | ICD-10-CM | POA: Diagnosis not present

## 2017-07-13 DIAGNOSIS — G4733 Obstructive sleep apnea (adult) (pediatric): Secondary | ICD-10-CM | POA: Diagnosis not present

## 2017-07-13 MED ORDER — LORAZEPAM 1 MG PO TABS: 1.0000 mg | ORAL_TABLET | Freq: Three times a day (TID) | ORAL | 0 refills | Status: DC | PRN

## 2017-07-13 MED ORDER — OXYCODONE-ACETAMINOPHEN 5-325 MG PO TABS: 1.0000 | ORAL_TABLET | ORAL | 0 refills | Status: DC | PRN

## 2017-07-13 NOTE — Discharge Instructions (Addendum)
1.  You may take medicines as needed for pain and muscle spasms (Percocet/Ativan #15). 2.  Apply moist heat to affected area several times daily. 3.  Return to the ER for worsening symptoms, persistent vomiting, difficulty breathing or other concerns.

## 2017-07-14 DIAGNOSIS — I2510 Atherosclerotic heart disease of native coronary artery without angina pectoris: Secondary | ICD-10-CM | POA: Diagnosis not present

## 2017-07-15 DIAGNOSIS — N189 Chronic kidney disease, unspecified: Secondary | ICD-10-CM | POA: Diagnosis not present

## 2017-07-15 DIAGNOSIS — Z5331 Laparoscopic surgical procedure converted to open procedure: Secondary | ICD-10-CM | POA: Diagnosis not present

## 2017-07-15 DIAGNOSIS — I252 Old myocardial infarction: Secondary | ICD-10-CM | POA: Diagnosis not present

## 2017-07-15 DIAGNOSIS — R000 Tachycardia, unspecified: Secondary | ICD-10-CM | POA: Diagnosis not present

## 2017-07-15 DIAGNOSIS — I2510 Atherosclerotic heart disease of native coronary artery without angina pectoris: Secondary | ICD-10-CM | POA: Diagnosis not present

## 2017-07-15 DIAGNOSIS — I509 Heart failure, unspecified: Secondary | ICD-10-CM | POA: Diagnosis not present

## 2017-07-15 DIAGNOSIS — K436 Other and unspecified ventral hernia with obstruction, without gangrene: Secondary | ICD-10-CM | POA: Diagnosis not present

## 2017-07-15 DIAGNOSIS — E039 Hypothyroidism, unspecified: Secondary | ICD-10-CM | POA: Diagnosis not present

## 2017-07-15 DIAGNOSIS — F319 Bipolar disorder, unspecified: Secondary | ICD-10-CM | POA: Diagnosis not present

## 2017-07-15 DIAGNOSIS — R9431 Abnormal electrocardiogram [ECG] [EKG]: Secondary | ICD-10-CM | POA: Diagnosis not present

## 2017-07-15 DIAGNOSIS — I130 Hypertensive heart and chronic kidney disease with heart failure and stage 1 through stage 4 chronic kidney disease, or unspecified chronic kidney disease: Secondary | ICD-10-CM | POA: Diagnosis not present

## 2017-07-15 DIAGNOSIS — E1122 Type 2 diabetes mellitus with diabetic chronic kidney disease: Secondary | ICD-10-CM | POA: Diagnosis not present

## 2017-07-15 DIAGNOSIS — R918 Other nonspecific abnormal finding of lung field: Secondary | ICD-10-CM | POA: Diagnosis not present

## 2017-07-15 DIAGNOSIS — K439 Ventral hernia without obstruction or gangrene: Secondary | ICD-10-CM | POA: Diagnosis not present

## 2017-07-15 DIAGNOSIS — K430 Incisional hernia with obstruction, without gangrene: Secondary | ICD-10-CM | POA: Diagnosis not present

## 2017-07-15 HISTORY — PX: HERNIA REPAIR: SHX51

## 2017-07-16 DIAGNOSIS — I509 Heart failure, unspecified: Secondary | ICD-10-CM | POA: Diagnosis not present

## 2017-07-16 DIAGNOSIS — F319 Bipolar disorder, unspecified: Secondary | ICD-10-CM | POA: Diagnosis not present

## 2017-07-16 DIAGNOSIS — I2510 Atherosclerotic heart disease of native coronary artery without angina pectoris: Secondary | ICD-10-CM | POA: Diagnosis not present

## 2017-07-16 DIAGNOSIS — I252 Old myocardial infarction: Secondary | ICD-10-CM | POA: Diagnosis not present

## 2017-07-16 DIAGNOSIS — N189 Chronic kidney disease, unspecified: Secondary | ICD-10-CM | POA: Diagnosis not present

## 2017-07-16 DIAGNOSIS — E1122 Type 2 diabetes mellitus with diabetic chronic kidney disease: Secondary | ICD-10-CM | POA: Diagnosis not present

## 2017-07-16 DIAGNOSIS — E039 Hypothyroidism, unspecified: Secondary | ICD-10-CM | POA: Diagnosis not present

## 2017-07-16 DIAGNOSIS — K430 Incisional hernia with obstruction, without gangrene: Secondary | ICD-10-CM | POA: Diagnosis not present

## 2017-07-16 DIAGNOSIS — I130 Hypertensive heart and chronic kidney disease with heart failure and stage 1 through stage 4 chronic kidney disease, or unspecified chronic kidney disease: Secondary | ICD-10-CM | POA: Diagnosis not present

## 2017-07-16 DIAGNOSIS — F17200 Nicotine dependence, unspecified, uncomplicated: Secondary | ICD-10-CM | POA: Insufficient documentation

## 2017-07-17 DIAGNOSIS — I252 Old myocardial infarction: Secondary | ICD-10-CM | POA: Diagnosis not present

## 2017-07-17 DIAGNOSIS — I509 Heart failure, unspecified: Secondary | ICD-10-CM | POA: Diagnosis not present

## 2017-07-17 DIAGNOSIS — N189 Chronic kidney disease, unspecified: Secondary | ICD-10-CM | POA: Diagnosis not present

## 2017-07-17 DIAGNOSIS — E1122 Type 2 diabetes mellitus with diabetic chronic kidney disease: Secondary | ICD-10-CM | POA: Diagnosis not present

## 2017-07-17 DIAGNOSIS — E039 Hypothyroidism, unspecified: Secondary | ICD-10-CM | POA: Diagnosis not present

## 2017-07-17 DIAGNOSIS — I130 Hypertensive heart and chronic kidney disease with heart failure and stage 1 through stage 4 chronic kidney disease, or unspecified chronic kidney disease: Secondary | ICD-10-CM | POA: Diagnosis not present

## 2017-07-17 DIAGNOSIS — K430 Incisional hernia with obstruction, without gangrene: Secondary | ICD-10-CM | POA: Diagnosis not present

## 2017-07-17 DIAGNOSIS — I2510 Atherosclerotic heart disease of native coronary artery without angina pectoris: Secondary | ICD-10-CM | POA: Diagnosis not present

## 2017-07-17 DIAGNOSIS — F319 Bipolar disorder, unspecified: Secondary | ICD-10-CM | POA: Diagnosis not present

## 2017-07-18 DIAGNOSIS — I2510 Atherosclerotic heart disease of native coronary artery without angina pectoris: Secondary | ICD-10-CM | POA: Diagnosis not present

## 2017-07-18 DIAGNOSIS — E039 Hypothyroidism, unspecified: Secondary | ICD-10-CM | POA: Diagnosis not present

## 2017-07-18 DIAGNOSIS — I252 Old myocardial infarction: Secondary | ICD-10-CM | POA: Diagnosis not present

## 2017-07-18 DIAGNOSIS — I130 Hypertensive heart and chronic kidney disease with heart failure and stage 1 through stage 4 chronic kidney disease, or unspecified chronic kidney disease: Secondary | ICD-10-CM | POA: Diagnosis not present

## 2017-07-18 DIAGNOSIS — E1122 Type 2 diabetes mellitus with diabetic chronic kidney disease: Secondary | ICD-10-CM | POA: Diagnosis not present

## 2017-07-18 DIAGNOSIS — F319 Bipolar disorder, unspecified: Secondary | ICD-10-CM | POA: Diagnosis not present

## 2017-07-18 DIAGNOSIS — I509 Heart failure, unspecified: Secondary | ICD-10-CM | POA: Diagnosis not present

## 2017-07-18 DIAGNOSIS — K430 Incisional hernia with obstruction, without gangrene: Secondary | ICD-10-CM | POA: Diagnosis not present

## 2017-07-18 DIAGNOSIS — N189 Chronic kidney disease, unspecified: Secondary | ICD-10-CM | POA: Diagnosis not present

## 2017-07-19 DIAGNOSIS — I2510 Atherosclerotic heart disease of native coronary artery without angina pectoris: Secondary | ICD-10-CM | POA: Diagnosis not present

## 2017-07-20 DIAGNOSIS — I2510 Atherosclerotic heart disease of native coronary artery without angina pectoris: Secondary | ICD-10-CM | POA: Diagnosis not present

## 2017-07-21 DIAGNOSIS — I2510 Atherosclerotic heart disease of native coronary artery without angina pectoris: Secondary | ICD-10-CM | POA: Diagnosis not present

## 2017-07-22 DIAGNOSIS — I2510 Atherosclerotic heart disease of native coronary artery without angina pectoris: Secondary | ICD-10-CM | POA: Diagnosis not present

## 2017-07-23 DIAGNOSIS — E1121 Type 2 diabetes mellitus with diabetic nephropathy: Secondary | ICD-10-CM | POA: Diagnosis not present

## 2017-07-23 DIAGNOSIS — N3946 Mixed incontinence: Secondary | ICD-10-CM | POA: Diagnosis not present

## 2017-07-23 DIAGNOSIS — I2510 Atherosclerotic heart disease of native coronary artery without angina pectoris: Secondary | ICD-10-CM | POA: Diagnosis not present

## 2017-07-24 DIAGNOSIS — I2510 Atherosclerotic heart disease of native coronary artery without angina pectoris: Secondary | ICD-10-CM | POA: Diagnosis not present

## 2017-07-25 DIAGNOSIS — I2510 Atherosclerotic heart disease of native coronary artery without angina pectoris: Secondary | ICD-10-CM | POA: Diagnosis not present

## 2017-07-26 DIAGNOSIS — I2510 Atherosclerotic heart disease of native coronary artery without angina pectoris: Secondary | ICD-10-CM | POA: Diagnosis not present

## 2017-07-27 DIAGNOSIS — I2510 Atherosclerotic heart disease of native coronary artery without angina pectoris: Secondary | ICD-10-CM | POA: Diagnosis not present

## 2017-07-28 DIAGNOSIS — I2510 Atherosclerotic heart disease of native coronary artery without angina pectoris: Secondary | ICD-10-CM | POA: Diagnosis not present

## 2017-07-29 DIAGNOSIS — I2510 Atherosclerotic heart disease of native coronary artery without angina pectoris: Secondary | ICD-10-CM | POA: Diagnosis not present

## 2017-07-30 DIAGNOSIS — Z09 Encounter for follow-up examination after completed treatment for conditions other than malignant neoplasm: Secondary | ICD-10-CM | POA: Diagnosis not present

## 2017-07-30 DIAGNOSIS — I2510 Atherosclerotic heart disease of native coronary artery without angina pectoris: Secondary | ICD-10-CM | POA: Diagnosis not present

## 2017-07-30 DIAGNOSIS — K439 Ventral hernia without obstruction or gangrene: Secondary | ICD-10-CM | POA: Diagnosis not present

## 2017-07-31 DIAGNOSIS — I2510 Atherosclerotic heart disease of native coronary artery without angina pectoris: Secondary | ICD-10-CM | POA: Diagnosis not present

## 2017-08-01 ENCOUNTER — Other Ambulatory Visit: Payer: Self-pay

## 2017-08-01 ENCOUNTER — Emergency Department: Payer: Medicare HMO

## 2017-08-01 ENCOUNTER — Emergency Department
Admission: EM | Admit: 2017-08-01 | Discharge: 2017-08-01 | Disposition: A | Discharge status: Home / Self Care | Payer: Medicare HMO | Attending: Emergency Medicine | Admitting: Emergency Medicine

## 2017-08-01 DIAGNOSIS — E039 Hypothyroidism, unspecified: Secondary | ICD-10-CM | POA: Insufficient documentation

## 2017-08-01 DIAGNOSIS — Y939 Activity, unspecified: Secondary | ICD-10-CM | POA: Insufficient documentation

## 2017-08-01 DIAGNOSIS — Z87891 Personal history of nicotine dependence: Secondary | ICD-10-CM | POA: Insufficient documentation

## 2017-08-01 DIAGNOSIS — G8918 Other acute postprocedural pain: Secondary | ICD-10-CM | POA: Diagnosis not present

## 2017-08-01 DIAGNOSIS — T148XXA Other injury of unspecified body region, initial encounter: Secondary | ICD-10-CM

## 2017-08-01 DIAGNOSIS — K91871 Postprocedural hematoma of a digestive system organ or structure following other procedure: Secondary | ICD-10-CM | POA: Diagnosis not present

## 2017-08-01 DIAGNOSIS — R1110 Vomiting, unspecified: Secondary | ICD-10-CM | POA: Diagnosis not present

## 2017-08-01 DIAGNOSIS — S301XXA Contusion of abdominal wall, initial encounter: Secondary | ICD-10-CM | POA: Diagnosis not present

## 2017-08-01 DIAGNOSIS — I130 Hypertensive heart and chronic kidney disease with heart failure and stage 1 through stage 4 chronic kidney disease, or unspecified chronic kidney disease: Secondary | ICD-10-CM | POA: Diagnosis not present

## 2017-08-01 DIAGNOSIS — Y999 Unspecified external cause status: Secondary | ICD-10-CM | POA: Diagnosis not present

## 2017-08-01 DIAGNOSIS — I509 Heart failure, unspecified: Secondary | ICD-10-CM | POA: Insufficient documentation

## 2017-08-01 DIAGNOSIS — R112 Nausea with vomiting, unspecified: Secondary | ICD-10-CM

## 2017-08-01 DIAGNOSIS — S3991XA Unspecified injury of abdomen, initial encounter: Secondary | ICD-10-CM | POA: Diagnosis present

## 2017-08-01 DIAGNOSIS — R109 Unspecified abdominal pain: Secondary | ICD-10-CM | POA: Diagnosis not present

## 2017-08-01 DIAGNOSIS — E1122 Type 2 diabetes mellitus with diabetic chronic kidney disease: Secondary | ICD-10-CM | POA: Diagnosis not present

## 2017-08-01 DIAGNOSIS — I2510 Atherosclerotic heart disease of native coronary artery without angina pectoris: Secondary | ICD-10-CM | POA: Insufficient documentation

## 2017-08-01 DIAGNOSIS — J45909 Unspecified asthma, uncomplicated: Secondary | ICD-10-CM | POA: Diagnosis not present

## 2017-08-01 DIAGNOSIS — N189 Chronic kidney disease, unspecified: Secondary | ICD-10-CM | POA: Diagnosis not present

## 2017-08-01 DIAGNOSIS — Y929 Unspecified place or not applicable: Secondary | ICD-10-CM | POA: Diagnosis not present

## 2017-08-01 DIAGNOSIS — Y829 Unspecified medical devices associated with adverse incidents: Secondary | ICD-10-CM | POA: Diagnosis not present

## 2017-08-01 LAB — COMPREHENSIVE METABOLIC PANEL
ALK PHOS: 95 U/L (ref 38–126)
ALT: 16 U/L (ref 14–54)
ANION GAP: 8 (ref 5–15)
AST: 14 U/L — ABNORMAL LOW (ref 15–41)
Albumin: 3.5 g/dL (ref 3.5–5.0)
BUN: 17 mg/dL (ref 6–20)
CALCIUM: 8.8 mg/dL — AB (ref 8.9–10.3)
CO2: 22 mmol/L (ref 22–32)
Chloride: 110 mmol/L (ref 101–111)
Creatinine, Ser: 0.61 mg/dL (ref 0.44–1.00)
GFR calc Af Amer: >60 mL/min (ref >60)
GFR calc non Af Amer: >60 mL/min (ref >60)
Glucose, Bld: 104 mg/dL — ABNORMAL HIGH (ref 65–99)
Potassium: 4.3 mmol/L (ref 3.5–5.1)
SODIUM: 140 mmol/L (ref 135–145)
TOTAL PROTEIN: 7.3 g/dL (ref 6.5–8.1)
Total Bilirubin: 0.6 mg/dL (ref 0.3–1.2)

## 2017-08-01 LAB — CBC
HCT: 31.5 % — ABNORMAL LOW (ref 35.0–47.0)
HEMOGLOBIN: 10.6 g/dL — AB (ref 12.0–16.0)
MCH: 31.2 pg (ref 26.0–34.0)
MCHC: 33.8 g/dL (ref 32.0–36.0)
MCV: 92.6 fL (ref 80.0–100.0)
Platelets: 368 10*3/uL (ref 150–440)
RBC: 3.41 MIL/uL — ABNORMAL LOW (ref 3.80–5.20)
RDW: 15.5 % — ABNORMAL HIGH (ref 11.5–14.5)
WBC: 8.4 10*3/uL (ref 3.6–11.0)

## 2017-08-01 LAB — URINALYSIS, COMPLETE (UACMP) WITH MICROSCOPIC
Bacteria, UA: NONE SEEN
Bilirubin Urine: NEGATIVE
GLUCOSE, UA: NEGATIVE mg/dL
HGB URINE DIPSTICK: NEGATIVE
KETONES UR: NEGATIVE mg/dL
LEUKOCYTES UA: NEGATIVE
NITRITE: NEGATIVE
PH: 5 (ref 5.0–8.0)
PROTEIN: NEGATIVE mg/dL
Specific Gravity, Urine: 1.023 (ref 1.005–1.030)

## 2017-08-01 LAB — LIPASE, BLOOD: Lipase: 33 U/L (ref 11–51)

## 2017-08-01 MED ORDER — ONDANSETRON HCL 4 MG/2ML IJ SOLN
4.0000 mg | Freq: Once | INTRAMUSCULAR | Status: AC
  Administered 2017-08-01: 4 mg via INTRAVENOUS
  Filled 2017-08-01: qty 2

## 2017-08-01 MED ORDER — OXYCODONE-ACETAMINOPHEN 7.5-325 MG PO TABS: 1.0000 | ORAL_TABLET | ORAL | 0 refills | Status: DC | PRN

## 2017-08-01 MED ORDER — SODIUM CHLORIDE 0.9 % IV SOLN
Freq: Once | INTRAVENOUS | Status: AC
  Administered 2017-08-01: 14:00:00 via INTRAVENOUS

## 2017-08-01 MED ORDER — IOPAMIDOL (ISOVUE-300) INJECTION 61%
100.0000 mL | Freq: Once | INTRAVENOUS | Status: AC | PRN
  Administered 2017-08-01: 100 mL via INTRAVENOUS

## 2017-08-01 MED ORDER — MORPHINE SULFATE (PF) 4 MG/ML IV SOLN
4.0000 mg | Freq: Once | INTRAVENOUS | Status: AC
  Administered 2017-08-01: 4 mg via INTRAVENOUS
  Filled 2017-08-01: qty 1

## 2017-08-01 MED ORDER — PROMETHAZINE HCL 25 MG PO TABS: 25.0000 mg | ORAL_TABLET | Freq: Four times a day (QID) | ORAL | 0 refills | Status: DC | PRN

## 2017-08-01 MED ORDER — PIPERACILLIN-TAZOBACTAM 3.375 G IVPB 30 MIN
3.3750 g | Freq: Once | INTRAVENOUS | Status: AC
  Administered 2017-08-01: 3.375 g via INTRAVENOUS
  Filled 2017-08-01: qty 50

## 2017-08-01 MED ORDER — ONDANSETRON 4 MG PO TBDP: 4.0000 mg | ORAL_TABLET | Freq: Three times a day (TID) | ORAL | 0 refills | Status: DC | PRN

## 2017-08-01 NOTE — ED Provider Notes (Signed)
[facility] Emergency Department Provider Note       Time seen: ----------------------------------------- 1:23 PM on 08/01/2017 -----------------------------------------   I have reviewed the triage vital signs and the nursing notes.  HISTORY   Chief Complaint Abdominal Pain    HPI Marie Clarke is a 47 y.o. female with a history of anemia, anxiety, asthma, bipolar disorder, coronary disease, CHF, COPD, depression, diabetes, hyperlipidemia and hypertension who presents to the ED for abdominal pain and vomiting.  Patient states she had abdominal hernia repair with mesh at [facility] 2-1/2 weeks ago.  Patient states her staples were removed on Tuesday and since then she is having significant right side abdominal pain.  Today she was having nausea and vomiting.  The pain started before the actual vomiting.  She denies any fever, reports pain is right-sided.  She denies any fevers, also had diarrhea.  Past Medical History:  Diagnosis Date  . Anemia   . Anxiety   . Asthma   . Bipolar disorder ([place])   . CAD (coronary artery disease) unk  . CHF (congestive heart failure) ([place])   . COPD (chronic obstructive pulmonary disease) ([place])   . Depression unk  . Diabetes mellitus without complication ([place])   . Diabetes mellitus, type II ([place])   . Drug overdose   . GERD (gastroesophageal reflux disease)   . Headache   . Hyperlipidemia   . Hypertension   . Left leg pain 04/29/2014  . MI (myocardial infarction) ([place])   . Muscle ache 09/16/2014  . Osteoporosis   . Overactive bladder   . Pancreatitis unk  . Reflex sympathetic dystrophy   . Renal insufficiency   . Sleep apnea    pt reported on 2/6/7 she currently is not using CPAP  . Sleep apnea   . Stroke ([place])   . Thyroid disease   . TIA (transient ischemic attack) unk  . TIA (transient ischemic attack)     Patient Active Problem List   Diagnosis Date Noted  . Sepsis ([place]) 04/14/2017  . Osteopenia 04/03/2017  .  Hypotension 09/17/2016  . Contusion of knee, left 10/12/2015  . Sprain of left ankle 10/12/2015  . Strain of left knee 10/12/2015  . Ankle instability 07/20/2015  . Incidental lung nodule 04/28/2015  . Chronic pain 03/28/2015  . Chronic low back pain (Location of Primary Source of Pain) (Bilateral) (L>R) 03/28/2015  . Chronic lower extremity pain (Left) 03/28/2015  . Abdominal wound dehiscence 03/28/2015  . Encounter for pain management planning 03/28/2015  . Morbid obesity ([place]) 03/28/2015  . Abnormal CT scan, lumbar spine 03/28/2015  . Lumbar facet hypertrophy 03/28/2015  . Lumbar facet syndrome (Location of Primary Source of Pain) (Bilateral) (L>R) 03/28/2015  . Lumbar foraminal stenosis (Bilateral) (L5-S1) 03/28/2015  . Chronic ankle pain (Left) 03/28/2015  . CRPS (complex regional pain syndrome) type I of lower limb (left ankle) 03/28/2015  . Neurogenic pain 03/28/2015  . Neuropathic pain 03/28/2015  . Myofascial pain 03/28/2015  . History of suicide attempt 03/28/2015  . 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 Abnormal gait 12/15/2014  . Congestive heart failure ([place]) 11/15/2014  . Abdominal wall abscess 09/20/2014  . 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 Diabetes mellitus, type 2 ([place]) 08/13/2014  . Bipolar affective disorder ([place]) 08/13/2014  . Type 2 diabetes mellitus ([place]) 08/13/2014  . Rectal prolapse 08/09/2014  . Rectal bleeding 08/09/2014  . Rectal bleed 08/09/2014  . Affective bipolar disorder ([place]) 08/05/2014  . Arteriosclerosis of coronary artery 08/05/2014  . CCF (  congestive cardiac failure) ([place]) 08/05/2014  . Chronic kidney disease 08/05/2014  . 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 Apnea, sleep 08/05/2014  . Temporary cerebral vascular dysfunction 08/05/2014  . Polypharmacy 04/29/2014  . Other long term (current) drug therapy 04/29/2014  . 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 Chronic kidney disease, stage III (moderate) ([place]) 12/14/2013  . Controlled  diabetes mellitus type II without complication ([place]) 24/10/7351  . Essential (primary) hypertension 12/03/2013  . Adult hypothyroidism 12/03/2013  . Controlled type 2 diabetes mellitus without complication ([place]) 29/92/4268    Past Surgical History:  Procedure Laterality Date  . ABDOMINAL HYSTERECTOMY    . HERNIA REPAIR  07/15/2017  . prolapse rectum surgery N/A July 2016  . TONSILLECTOMY      Allergies Diazepam; Sulfa antibiotics; Cephalexin; Ciprofloxacin; Flagyl [metronidazole]; Geodon [ziprasidone hcl]; Levofloxacin; Vancomycin; Penicillins; and Pregabalin  Social History Social History   Tobacco Use  . Smoking status: Former Smoker    Packs/day: 1.00    Types: Cigarettes    Last attempt to quit: 06/12/2016    Years since quitting: 1.1  . Smokeless tobacco: Never Used  Substance Use Topics  . Alcohol use: No    Alcohol/week: 0.0 oz  . Drug use: No   Review of Systems Constitutional: Negative for fever. Cardiovascular: Negative for chest pain. Respiratory: Negative for shortness of breath. Gastrointestinal: Positive for right-sided abdominal pain, vomiting Musculoskeletal: Negative for back pain. Skin: Negative for rash. Neurological: Negative for headaches, focal weakness or numbness.  All systems negative/normal/unremarkable except as stated in the HPI  ____________________________________________   PHYSICAL EXAM:  VITAL SIGNS: ED Triage Vitals  Enc Vitals Group     BP 08/01/17 1206 132/85     Pulse Rate 08/01/17 1206 (!) 114     Resp 08/01/17 1206 18     Temp 08/01/17 1206 98.6 F (37 C)     Temp Source 08/01/17 1206 Oral     SpO2 08/01/17 1206 96 %     Weight 08/01/17 1207 230 lb (104.3 kg)     Height 08/01/17 1207 5\' 4"  (1.626 m)     Head Circumference --      Peak Flow --      Pain Score 08/01/17 1207 10     Pain Loc --      Pain Edu? --      Excl. in [place]? --    Constitutional: Alert and oriented. Well appearing and in no distress. Eyes:  Conjunctivae are normal. Normal extraocular movements. ENT   Head: Normocephalic and atraumatic.   Nose: No congestion/rhinnorhea.   Mouth/Throat: Mucous membranes are moist.   Neck: No stridor. Cardiovascular: Normal rate, regular rhythm. No murmurs, rubs, or gallops. Respiratory: Normal respiratory effort without tachypnea nor retractions. Breath sounds are clear and equal bilaterally. No wheezes/rales/rhonchi. Gastrointestinal: Significant right-sided abdominal tenderness, post surgical sites are unremarkable.  No drainage from the wounds, no surrounding erythema or signs of cellulitis. Musculoskeletal: Nontender with normal range of motion in extremities. No lower extremity tenderness nor edema. Neurologic:  Normal speech and language. No gross focal neurologic deficits are appreciated.  Skin:  Skin is warm, dry and intact. No rash noted. Psychiatric: Mood and affect are normal. Speech and behavior are normal.  ____________________________________________  ED COURSE:  As part of my medical decision making, I reviewed the following data within the [place] History obtained from family if available, nursing notes, old chart and ekg, as well as notes from prior ED visits. Patient presented for  postoperative abdominal pain, we will assess with labs and imaging as indicated at this time.   Procedures ____________________________________________   LABS (pertinent positives/negatives)  Labs Reviewed  COMPREHENSIVE METABOLIC PANEL - Abnormal; Notable for the following components:      Result Value   Glucose, Bld 104 (*)    Calcium 8.8 (*)    AST 14 (*)    All other components within normal limits  CBC - Abnormal; Notable for the following components:   RBC 3.41 (*)    Hemoglobin 10.6 (*)    HCT 31.5 (*)    RDW 15.5 (*)    All other components within normal limits  URINALYSIS, COMPLETE (UACMP) WITH MICROSCOPIC - Abnormal; Notable for the following  components:   Color, Urine YELLOW (*)    APPearance CLEAR (*)    All other components within normal limits  LIPASE, BLOOD    RADIOLOGY Images were viewed by me  CT the abdomen pelvis with contrast IMPRESSION: 1. Postoperative soft tissue stranding throughout the anterior and right lateral abdominal and upper pelvic wall regions. Fluid collection in the periumbilical region which appears to represent either a seroma or liquefying hematoma measuring 7.0 x 7.0 x 4.7 cm. No abscess in the perioperative region appreciable.  2. No evidence of bowel obstruction. No abscess within the peritoneum or retroperitoneum of the abdomen pelvis. Appendix appears normal. Evidence of previous surgery in the sigmoid colon region with patent anastomosis.  3. No evident renal or ureteral calculus. No hydronephrosis. No urinary bladder wall thickening.  4.  Uterus absent.  5.  Hepatic steatosis.  ____________________________________________  DIFFERENTIAL DIAGNOSIS   Gastroenteritis, dehydration, electrolyte abnormality, obstruction, intra-abdominal infection  FINAL ASSESSMENT AND PLAN  Postoperative abdominal pain, postoperative fluid collection   Plan: The patient had presented for abdominal pain and vomiting. Patient's labs were reassuring. Patient's imaging does reveal significant postoperative fluid collection in the periumbilical region.  I will discuss with general surgery there for guidance.   Laurence Aly, MD   Note: This note was generated in part or whole with voice recognition software. Voice recognition is usually quite accurate but there are transcription errors that can and very often do occur. I apologize for any typographical errors that were not detected and corrected.     Earleen Newport, MD 08/01/17 [phone]

## 2017-08-01 NOTE — ED Provider Notes (Signed)
I discussed the case with her general surgeon that had operated on her who agrees to see her in the office at 9:00 tomorrow morning.  Patient is agreeable to this plan.  She did receive a dose of Zosyn for antibiotic coverage.   Earleen Newport, MD 08/01/17 [phone]

## 2017-08-01 NOTE — ED Triage Notes (Signed)
Pt states she had umbilical hernia repair surgery with mesh at [facility] 6/3,states she had her staples removed on Tuesday and since has been having abd pain, and today having N/V.Marland Kitchen Denies fever. States the is on the right side of the abd.

## 2017-08-02 DIAGNOSIS — I2510 Atherosclerotic heart disease of native coronary artery without angina pectoris: Secondary | ICD-10-CM | POA: Diagnosis not present

## 2017-08-02 DIAGNOSIS — R109 Unspecified abdominal pain: Secondary | ICD-10-CM | POA: Diagnosis not present

## 2017-08-02 DIAGNOSIS — Z09 Encounter for follow-up examination after completed treatment for conditions other than malignant neoplasm: Secondary | ICD-10-CM | POA: Diagnosis not present

## 2017-08-02 DIAGNOSIS — K439 Ventral hernia without obstruction or gangrene: Secondary | ICD-10-CM | POA: Diagnosis not present

## 2017-08-03 DIAGNOSIS — I2510 Atherosclerotic heart disease of native coronary artery without angina pectoris: Secondary | ICD-10-CM | POA: Diagnosis not present

## 2017-08-04 DIAGNOSIS — I2510 Atherosclerotic heart disease of native coronary artery without angina pectoris: Secondary | ICD-10-CM | POA: Diagnosis not present

## 2017-08-05 ENCOUNTER — Encounter: Payer: Self-pay | Admitting: Psychiatry

## 2017-08-05 ENCOUNTER — Ambulatory Visit (INDEPENDENT_AMBULATORY_CARE_PROVIDER_SITE_OTHER): Payer: Medicare HMO | Admitting: Psychiatry

## 2017-08-05 ENCOUNTER — Other Ambulatory Visit: Payer: Self-pay

## 2017-08-05 VITALS — BP 141/94 | HR 111 | Temp 98.5°F | Wt 227.4 lb

## 2017-08-05 DIAGNOSIS — F17200 Nicotine dependence, unspecified, uncomplicated: Secondary | ICD-10-CM

## 2017-08-05 DIAGNOSIS — F3160 Bipolar disorder, current episode mixed, unspecified: Secondary | ICD-10-CM | POA: Diagnosis not present

## 2017-08-05 DIAGNOSIS — F4310 Post-traumatic stress disorder, unspecified: Secondary | ICD-10-CM | POA: Diagnosis not present

## 2017-08-05 DIAGNOSIS — I2510 Atherosclerotic heart disease of native coronary artery without angina pectoris: Secondary | ICD-10-CM | POA: Diagnosis not present

## 2017-08-05 MED ORDER — HYDROXYZINE HCL 10 MG PO TABS: 10.0000 mg | ORAL_TABLET | Freq: Two times a day (BID) | ORAL | 0 refills | Status: DC | PRN

## 2017-08-05 MED ORDER — LAMOTRIGINE 25 MG PO TABS: 25.0000 mg | ORAL_TABLET | Freq: Every day | ORAL | 0 refills | Status: DC

## 2017-08-05 MED ORDER — BUSPIRONE HCL 15 MG PO TABS: 15.0000 mg | ORAL_TABLET | Freq: Three times a day (TID) | ORAL | 0 refills | Status: DC

## 2017-08-05 MED ORDER — QUETIAPINE FUMARATE 25 MG PO TABS: 75.0000 mg | ORAL_TABLET | Freq: Every day | ORAL | 0 refills | Status: DC

## 2017-08-05 NOTE — Progress Notes (Signed)
[facility] MD OP Progress Note  08/05/2017 1:55 PM Marie Clarke  MRN:  [id number]  Chief Complaint: ' I am here for follow up." Chief Complaint    Follow-up; Medication Refill     HPI: Marie Clarke is a 47 year old Caucasian female, single, on SSD, lives in [place], has a history of bipolar disorder, COPD, OSA on CPAP, coronary artery disease, hyperlipidemia, hypertension, hypothyroidism, chronic back pain, recent abdominal surgery, presented to the clinic today for a follow-up visit.  Patient used to follow up with Dr. Gretel Acre in the past.  This is her second visit with Probation officer.  Patient today reports she has been noncompliant with her medications since she could not come in for an earlier appointment.  The last time she was seen in clinic was in January 2019.  Patient reports she had an abdominal surgery after that.  She reports she hence could not make it to her appointments.  Patient today reports she has been having some anxiety symptoms.  She reports some chest pain and shortness of breath on and off.  She reports she would like to get back on lorazepam.  She reports she was given the lorazepam when she went to the emergency department recently for vomiting.  She reports it was prescribed for her muscle spasms.  Discussed with patient that if it is for muscle spasms then she need to follow-up with her provider who is managing her pain.  Also discussed with her that if she were taking it for anxiety she can be started on hydroxyzine as needed instead.  Discussed medication readjustment with patient.  Patient does have some sleep problems and hence discussed increasing her Seroquel today.  Also discussed that she need to be restarted on Lamictal at a lower dose ( due to noncompliance)  and then titrated back up due to the risk of Stevens-Johnson syndrome.  Discussed psychotherapy referral with patient.  Patient however reports she is not ready yet for the same.  She reports good social support from her aunt.   She also has access to  counseling groups at church.  Visit Diagnosis:    ICD-10-CM   1. Bipolar I disorder, most recent episode mixed ([place]) F31.60 busPIRone (BUSPAR) 15 MG tablet  2. PTSD (post-traumatic stress disorder) F43.10   3. Tobacco use disorder F17.200     Past Psychiatric History: Reviewed past psychiatric history from my progress note on 03/06/2017.  Past Medical History:  Past Medical History:  Diagnosis Date  . Anemia   . Anxiety   . Asthma   . Bipolar disorder ([place])   . CAD (coronary artery disease) unk  . CHF (congestive heart failure) ([place])   . COPD (chronic obstructive pulmonary disease) ([place])   . Depression unk  . Diabetes mellitus without complication ([place])   . Diabetes mellitus, type II ([place])   . Drug overdose   . GERD (gastroesophageal reflux disease)   . Headache   . Hyperlipidemia   . Hypertension   . Left leg pain 04/29/2014  . MI (myocardial infarction) ([place])   . Muscle ache 09/16/2014  . Osteoporosis   . Overactive bladder   . Pancreatitis unk  . Reflex sympathetic dystrophy   . Renal insufficiency   . Sleep apnea    pt reported on 2/6/7 she currently is not using CPAP  . Sleep apnea   . Stroke ([place])   . Thyroid disease   . TIA (transient ischemic attack) unk  . TIA (transient ischemic attack)  Past Surgical History:  Procedure Laterality Date  . ABDOMINAL HYSTERECTOMY    . HERNIA REPAIR  07/15/2017  . prolapse rectum surgery N/A July 2016  . TONSILLECTOMY      Family Psychiatric History: Reviewed family psychiatric history from my progress note on 03/06/2017.  Family History:  Family History  Problem Relation Age of Onset  . Diabetes Mellitus II Mother   . CAD Mother   . Sleep apnea Mother   . Osteoarthritis Mother   . Osteoporosis Mother   . Anxiety disorder Mother   . Depression Mother   . Bipolar disorder Mother   . Bipolar disorder Father   . Hypertension Father   . Depression Father   . Anxiety disorder Father   .  Post-traumatic stress disorder Sister     Social History: I have reviewed social history from my progress note on 03/06/2017 Social History   Socioeconomic History  . Marital status: Single    Spouse name: Not on file  . Number of children: 0  . Years of education: Not on file  . Highest education level: High school graduate  Occupational History    Comment: not employed  Social Needs  . Financial resource strain: Not hard at all  . Food insecurity:    Worry: Never true    Inability: Never true  . Transportation needs:    Medical: Yes    Non-medical: Yes  Tobacco Use  . Smoking status: Former Smoker    Packs/day: 1.00    Types: Cigarettes    Last attempt to quit: 06/12/2016    Years since quitting: 1.1  . Smokeless tobacco: Never Used  Substance and Sexual Activity  . Alcohol use: No    Alcohol/week: 0.0 oz  . Drug use: No  . Sexual activity: Not Currently  Lifestyle  . Physical activity:    Days per week: 0 days    Minutes per session: 0 min  . Stress: Not at all  Relationships  . Social connections:    Talks on phone: More than three times a week    Gets together: More than three times a week    Attends religious service: More than 4 times per year    Active member of club or organization: No    Attends meetings of clubs or organizations: Never    Relationship status: Never married  Other Topics Concern  . Not on file  Social History Narrative  . Not on file    Allergies:  Allergies  Allergen Reactions  . Diazepam Hives and Nausea And Vomiting  . Sulfa Antibiotics Hives  . Cephalexin Hives  . Ciprofloxacin Hives  . Flagyl [Metronidazole] Hives  . Geodon [Ziprasidone Hcl]     CONFUSED  . Levofloxacin Hives  . Vancomycin Hives  . Penicillins Nausea And Vomiting    Has patient had a PCN reaction causing immediate rash, facial/tongue/throat swelling, SOB or lightheadedness with hypotension: No Has patient had a PCN reaction causing severe rash involving  mucus membranes or skin necrosis: No Has patient had a PCN reaction that required hospitalization: No Has patient had a PCN reaction occurring within the last 10 years: No If all of the above answers are "NO", then may proceed with Cephalosporin use.   . Pregabalin Hives and Rash    Metabolic Disorder Labs: No results found for: HGBA1C, MPG No results found for: PROLACTIN No results found for: CHOL, TRIG, HDL, CHOLHDL, VLDL, LDLCALC No results found for: TSH  Therapeutic Level Labs:  No results found for: LITHIUM No results found for: VALPROATE No components found for:  CBMZ  Current Medications: Current Outpatient Medications  Medication Sig Dispense Refill  . acetaminophen (TYLENOL) 325 MG tablet Take 1 tablet (325 mg total) by mouth every 6 (six) hours as needed for mild pain (or Fever >/= 101).    Marland Kitchen albuterol (PROVENTIL HFA;VENTOLIN HFA) 108 (90 BASE) MCG/ACT inhaler Inhale 2 puffs into the lungs every 4 (four) hours as needed for wheezing or shortness of breath.    Marland Kitchen aspirin EC 81 MG tablet Take 81 mg by mouth daily at 12 noon.    Marland Kitchen atorvastatin (LIPITOR) 40 MG tablet Take 40 mg by mouth at bedtime.     Marland Kitchen azelastine (ASTELIN) 0.1 % nasal spray Place 1 spray into both nostrils daily.    . bumetanide (BUMEX) 2 MG tablet Take 1 tablet (2 mg total) by mouth 2 (two) times daily. 60 tablet 11  . busPIRone (BUSPAR) 15 MG tablet Take 1 tablet (15 mg total) by mouth 3 (three) times daily. 270 tablet 0  . gabapentin (NEURONTIN) 600 MG tablet Take 1,200 mg by mouth 3 (three) times daily.     Marland Kitchen HYDROcodone-acetaminophen (NORCO/VICODIN) 5-325 MG tablet Take 1 tablet by mouth every 6 (six) hours as needed for moderate pain. 10 tablet 0  . ibuprofen (ADVIL,MOTRIN) 800 MG tablet Take 800 mg by mouth every 8 (eight) hours as needed.    Marland Kitchen KLOR-CON M20 20 MEQ tablet Take 20 mEq by mouth daily.     Marland Kitchen levothyroxine (SYNTHROID, LEVOTHROID) 200 MCG tablet Take 200 mcg by mouth daily.     .  methocarbamol (ROBAXIN) 500 MG tablet 1-2 tablets every 6 hours prn muscle spasms. 20 tablet 0  . montelukast (SINGULAIR) 10 MG tablet Take 10 mg by mouth daily.     . ondansetron (ZOFRAN ODT) 4 MG disintegrating tablet Take 1 tablet (4 mg total) by mouth every 8 (eight) hours as needed for nausea or vomiting. 20 tablet 0  . ondansetron (ZOFRAN ODT) 4 MG disintegrating tablet Take 1 tablet (4 mg total) by mouth every 8 (eight) hours as needed for nausea or vomiting. 20 tablet 0  . oxyCODONE-acetaminophen (PERCOCET) 7.5-325 MG tablet Take 1 tablet by mouth every 4 (four) hours as needed for severe pain. 20 tablet 0  . oxyCODONE-acetaminophen (PERCOCET/ROXICET) 5-325 MG tablet Take 1 tablet by mouth every 4 (four) hours as needed for severe pain. 15 tablet 0  . promethazine (PHENERGAN) 25 MG tablet Take 1 tablet (25 mg total) by mouth every 6 (six) hours as needed for nausea or vomiting. 20 tablet 0  . ranitidine (ZANTAC) 300 MG tablet Take 1 tablet (300 mg total) by mouth 2 (two) times daily. 60 tablet 0  . tiotropium (SPIRIVA) 18 MCG inhalation capsule Place 18 mcg into inhaler and inhale at bedtime.    Marland Kitchen glipiZIDE (GLUCOTROL XL) 10 MG 24 hr tablet Take 10 mg by mouth.     . hydrOXYzine (ATARAX/VISTARIL) 10 MG tablet Take 1-3 tablets (10-30 mg total) by mouth 2 (two) times daily as needed (for severe anxiety sx). 300 tablet 0  . lamoTRIgine (LAMICTAL) 25 MG tablet Take 1-2 tablets (25-50 mg total) by mouth daily. Take 25 mg for 2 weeks and increase to 50 mg 180 tablet 0  . levothyroxine (SYNTHROID, LEVOTHROID) 25 MCG tablet Take 25 mcg by mouth daily before breakfast.     . QUEtiapine (SEROQUEL) 25 MG tablet Take 3 tablets (75 mg total)  by mouth at bedtime. 270 tablet 0   No current facility-administered medications for this visit.      Musculoskeletal: Strength & Muscle Tone: within normal limits Gait & Station: normal Patient leans: N/A  Psychiatric Specialty Exam: Review of Systems   Psychiatric/Behavioral: Positive for depression. The patient is nervous/anxious and has insomnia.   All other systems reviewed and are negative.   Blood pressure (!) 141/94, pulse (!) 111, temperature 98.5 F (36.9 C), temperature source Oral, weight 227 lb 6.4 oz (103.1 kg).Body mass index is 39.03 kg/m.  General Appearance: Casual  Eye Contact:  Fair  Speech:  Normal Rate  Volume:  Normal  Mood:  Anxious and Dysphoric  Affect:  Congruent  Thought Process:  Goal Directed and Descriptions of Associations: Intact  Orientation:  Full (Time, Place, and Person)  Thought Content: Logical   Suicidal Thoughts:  No  Homicidal Thoughts:  No  Memory:  Immediate;   Fair Recent;   Fair Remote;   Fair  Judgement:  Fair  Insight:  Fair  Psychomotor Activity:  Normal  Concentration:  Concentration: Fair and Attention Span: Fair  Recall:  [employer] of Knowledge: Fair  Language: Fair  Akathisia:  No  Handed:  Right  AIMS (if indicated): denies rigidity, stiffness  Assets:  Communication Skills Desire for Improvement Social Support  ADL's:  Intact  Cognition: WNL  Sleep:  Poor   Screenings: PHQ2-9     Office Visit from 03/28/2015 in [facility]  PHQ-2 Total Score  0       Assessment and Plan: Marie Clarke is a 47 year old Caucasian female who has a history of bipolar disorder, multiple medical problems, presented to the clinic today for a follow-up visit.  Patient recently had abdominal surgery and is recovering from the same.  Patient does struggle with some mood symptoms.  Discussed medication readjustment.  She has been noncompliant with her medications as well as follow-up visit.  Plan as noted below plan  Plan For bipolar disorder Restart Lamictal at 25 mg for 2 weeks and increase to 50 mg p.o. daily. Continue BuSpar 15 mg p.o. 3 times daily. Increase Seroquel to 75 mg p.o. daily.   For tobacco use disorder Provided smoking cessation  counseling.  For OSA, noncompliant with CPAP Provided education.  Follow-up in clinic in 6-7 weeks.  More than 50 % of the time was spent for psychoeducation and supportive psychotherapy and care coordination.  This note was generated in part or whole with voice recognition software. Voice recognition is usually quite accurate but there are transcription errors that can and very often do occur. I apologize for any typographical errors that were not detected and corrected.     Ursula Alert, MD 08/05/2017, 1:55 PM

## 2017-08-05 NOTE — Patient Instructions (Signed)
Hydroxyzine capsules or tablets What is this medicine? HYDROXYZINE (hye [place] i zeen) is an antihistamine. This medicine is used to treat allergy symptoms. It is also used to treat anxiety and tension. This medicine can be used with other medicines to induce sleep before surgery. This medicine may be used for other purposes; ask your health care provider or pharmacist if you have questions. COMMON BRAND NAME(S): ANX, Atarax, Rezine, Vistaril What should I tell my health care provider before I take this medicine? They need to know if you have any of these conditions: -any chronic illness -difficulty passing urine -glaucoma -heart disease -kidney disease -liver disease -lung disease -an unusual or allergic reaction to hydroxyzine, cetirizine, other medicines, foods, dyes, or preservatives -pregnant or trying to get pregnant -breast-feeding How should I use this medicine? Take this medicine by mouth with a full glass of water. Follow the directions on the prescription label. You may take this medicine with food or on an empty stomach. Take your medicine at regular intervals. Do not take your medicine more often than directed. Talk to your pediatrician regarding the use of this medicine in children. Special care may be needed. While this drug may be prescribed for children as young as 75 years of age for selected conditions, precautions do apply. Patients over 62 years old may have a stronger reaction and need a smaller dose. Overdosage: If you think you have taken too much of this medicine contact a poison control center or emergency room at once. NOTE: This medicine is only for you. Do not share this medicine with others. What if I miss a dose? If you miss a dose, take it as soon as you can. If it is almost time for your next dose, take only that dose. Do not take double or extra doses. What may interact with this medicine? -alcohol -barbiturate medicines for sleep or seizures -medicines for  colds, allergies -medicines for depression, anxiety, or emotional disturbances -medicines for pain -medicines for sleep -muscle relaxants This list may not describe all possible interactions. Give your health care provider a list of all the medicines, herbs, non-prescription drugs, or dietary supplements you use. Also tell them if you smoke, drink alcohol, or use illegal drugs. Some items may interact with your medicine. What should I watch for while using this medicine? Tell your doctor or health care professional if your symptoms do not improve. You may get drowsy or dizzy. Do not drive, use machinery, or do anything that needs mental alertness until you know how this medicine affects you. Do not stand or sit up quickly, especially if you are an older patient. This reduces the risk of dizzy or fainting spells. Alcohol may interfere with the effect of this medicine. Avoid alcoholic drinks. Your mouth may get dry. Chewing sugarless gum or sucking hard candy, and drinking plenty of water may help. Contact your doctor if the problem does not go away or is severe. This medicine may cause dry eyes and blurred vision. If you wear contact lenses you may feel some discomfort. Lubricating drops may help. See your eye doctor if the problem does not go away or is severe. If you are receiving skin tests for allergies, tell your doctor you are using this medicine. What side effects may I notice from receiving this medicine? Side effects that you should report to your doctor or health care professional as soon as possible: -fast or irregular heartbeat -difficulty passing urine -seizures -slurred speech or confusion -tremor Side effects that  usually do not require medical attention (report to your doctor or health care professional if they continue or are bothersome): -constipation -drowsiness -fatigue -headache -stomach upset This list may not describe all possible side effects. Call your doctor for  medical advice about side effects. You may report side effects to FDA at 1-800-FDA-1088. Where should I keep my medicine? Keep out of the reach of children. Store at room temperature between 15 and 30 degrees C (59 and 86 degrees F). Keep container tightly closed. Throw away any unused medicine after the expiration date. NOTE: This sheet is a summary. It may not cover all possible information. If you have questions about this medicine, talk to your doctor, pharmacist, or health care provider.  2018 Elsevier/Gold Standard (2007-06-13 14:50:59)  

## 2017-08-06 DIAGNOSIS — I2510 Atherosclerotic heart disease of native coronary artery without angina pectoris: Secondary | ICD-10-CM | POA: Diagnosis not present

## 2017-08-07 DIAGNOSIS — I2510 Atherosclerotic heart disease of native coronary artery without angina pectoris: Secondary | ICD-10-CM | POA: Diagnosis not present

## 2017-08-08 DIAGNOSIS — N3941 Urge incontinence: Secondary | ICD-10-CM | POA: Diagnosis not present

## 2017-08-08 DIAGNOSIS — I2510 Atherosclerotic heart disease of native coronary artery without angina pectoris: Secondary | ICD-10-CM | POA: Diagnosis not present

## 2017-08-09 DIAGNOSIS — I2510 Atherosclerotic heart disease of native coronary artery without angina pectoris: Secondary | ICD-10-CM | POA: Diagnosis not present

## 2017-08-10 ENCOUNTER — Emergency Department: Payer: Medicare HMO

## 2017-08-10 ENCOUNTER — Other Ambulatory Visit: Payer: Self-pay

## 2017-08-10 ENCOUNTER — Emergency Department
Admission: EM | Admit: 2017-08-10 | Discharge: 2017-08-10 | Disposition: A | Discharge status: Home / Self Care | Payer: Medicare HMO | Attending: Emergency Medicine | Admitting: Emergency Medicine

## 2017-08-10 ENCOUNTER — Encounter: Payer: Self-pay | Admitting: Emergency Medicine

## 2017-08-10 DIAGNOSIS — Z7984 Long term (current) use of oral hypoglycemic drugs: Secondary | ICD-10-CM | POA: Insufficient documentation

## 2017-08-10 DIAGNOSIS — I252 Old myocardial infarction: Secondary | ICD-10-CM | POA: Insufficient documentation

## 2017-08-10 DIAGNOSIS — I2510 Atherosclerotic heart disease of native coronary artery without angina pectoris: Secondary | ICD-10-CM | POA: Insufficient documentation

## 2017-08-10 DIAGNOSIS — F419 Anxiety disorder, unspecified: Secondary | ICD-10-CM | POA: Insufficient documentation

## 2017-08-10 DIAGNOSIS — R112 Nausea with vomiting, unspecified: Secondary | ICD-10-CM | POA: Insufficient documentation

## 2017-08-10 DIAGNOSIS — J45909 Unspecified asthma, uncomplicated: Secondary | ICD-10-CM | POA: Diagnosis not present

## 2017-08-10 DIAGNOSIS — Z79899 Other long term (current) drug therapy: Secondary | ICD-10-CM | POA: Insufficient documentation

## 2017-08-10 DIAGNOSIS — S301XXD Contusion of abdominal wall, subsequent encounter: Secondary | ICD-10-CM

## 2017-08-10 DIAGNOSIS — F17210 Nicotine dependence, cigarettes, uncomplicated: Secondary | ICD-10-CM | POA: Insufficient documentation

## 2017-08-10 DIAGNOSIS — L7634 Postprocedural seroma of skin and subcutaneous tissue following other procedure: Secondary | ICD-10-CM | POA: Insufficient documentation

## 2017-08-10 DIAGNOSIS — R109 Unspecified abdominal pain: Secondary | ICD-10-CM | POA: Diagnosis not present

## 2017-08-10 DIAGNOSIS — E1122 Type 2 diabetes mellitus with diabetic chronic kidney disease: Secondary | ICD-10-CM | POA: Insufficient documentation

## 2017-08-10 DIAGNOSIS — I130 Hypertensive heart and chronic kidney disease with heart failure and stage 1 through stage 4 chronic kidney disease, or unspecified chronic kidney disease: Secondary | ICD-10-CM | POA: Diagnosis not present

## 2017-08-10 DIAGNOSIS — I509 Heart failure, unspecified: Secondary | ICD-10-CM | POA: Diagnosis not present

## 2017-08-10 DIAGNOSIS — R1110 Vomiting, unspecified: Secondary | ICD-10-CM | POA: Diagnosis not present

## 2017-08-10 DIAGNOSIS — N189 Chronic kidney disease, unspecified: Secondary | ICD-10-CM | POA: Diagnosis not present

## 2017-08-10 DIAGNOSIS — Z7982 Long term (current) use of aspirin: Secondary | ICD-10-CM | POA: Diagnosis not present

## 2017-08-10 DIAGNOSIS — R1031 Right lower quadrant pain: Secondary | ICD-10-CM | POA: Diagnosis present

## 2017-08-10 DIAGNOSIS — G8918 Other acute postprocedural pain: Secondary | ICD-10-CM

## 2017-08-10 LAB — URINALYSIS, COMPLETE (UACMP) WITH MICROSCOPIC
BACTERIA UA: NONE SEEN
Bilirubin Urine: NEGATIVE
Glucose, UA: NEGATIVE mg/dL
Hgb urine dipstick: NEGATIVE
KETONES UR: NEGATIVE mg/dL
LEUKOCYTES UA: NEGATIVE
Nitrite: NEGATIVE
PH: 5 (ref 5.0–8.0)
Protein, ur: NEGATIVE mg/dL
SQUAMOUS EPITHELIAL / LPF: NONE SEEN (ref 0–5)
Specific Gravity, Urine: 1.012 (ref 1.005–1.030)

## 2017-08-10 LAB — COMPREHENSIVE METABOLIC PANEL
ALBUMIN: 4.1 g/dL (ref 3.5–5.0)
ALT: 14 U/L (ref 0–44)
AST: 17 U/L (ref 15–41)
Alkaline Phosphatase: 104 U/L (ref 38–126)
Anion gap: 11 (ref 5–15)
BUN: 20 mg/dL (ref 6–20)
CHLORIDE: 106 mmol/L (ref 98–111)
CO2: 24 mmol/L (ref 22–32)
CREATININE: 0.78 mg/dL (ref 0.44–1.00)
Calcium: 9.4 mg/dL (ref 8.9–10.3)
GFR calc Af Amer: >60 mL/min (ref >60)
GFR calc non Af Amer: >60 mL/min (ref >60)
GLUCOSE: 108 mg/dL — AB (ref 70–99)
POTASSIUM: 3.9 mmol/L (ref 3.5–5.1)
Sodium: 141 mmol/L (ref 135–145)
Total Bilirubin: 0.2 mg/dL — ABNORMAL LOW (ref 0.3–1.2)
Total Protein: 8.1 g/dL (ref 6.5–8.1)

## 2017-08-10 LAB — CBC
HEMATOCRIT: 34.5 % — AB (ref 35.0–47.0)
Hemoglobin: 11.7 g/dL — ABNORMAL LOW (ref 12.0–16.0)
MCH: 31.2 pg (ref 26.0–34.0)
MCHC: 33.8 g/dL (ref 32.0–36.0)
MCV: 92.1 fL (ref 80.0–100.0)
Platelets: 330 10*3/uL (ref 150–440)
RBC: 3.74 MIL/uL — ABNORMAL LOW (ref 3.80–5.20)
RDW: 16.4 % — AB (ref 11.5–14.5)
WBC: 8.9 10*3/uL (ref 3.6–11.0)

## 2017-08-10 LAB — LIPASE, BLOOD: LIPASE: 44 U/L (ref 11–51)

## 2017-08-10 MED ORDER — ONDANSETRON HCL 4 MG/2ML IJ SOLN
4.0000 mg | Freq: Once | INTRAMUSCULAR | Status: AC
  Administered 2017-08-10: 4 mg via INTRAVENOUS
  Filled 2017-08-10: qty 2

## 2017-08-10 MED ORDER — SODIUM CHLORIDE 0.9 % IV BOLUS
1000.0000 mL | Freq: Once | INTRAVENOUS | Status: AC
  Administered 2017-08-10: 1000 mL via INTRAVENOUS

## 2017-08-10 MED ORDER — MORPHINE SULFATE (PF) 4 MG/ML IV SOLN
4.0000 mg | Freq: Once | INTRAVENOUS | Status: AC
  Administered 2017-08-10: 4 mg via INTRAVENOUS
  Filled 2017-08-10: qty 1

## 2017-08-10 MED ORDER — IOHEXOL 350 MG/ML SOLN
75.0000 mL | Freq: Once | INTRAVENOUS | Status: AC | PRN
  Administered 2017-08-10: 75 mL via INTRAVENOUS

## 2017-08-10 NOTE — ED Provider Notes (Signed)
[facility] Emergency Department Provider Note  ____________________________________________  Time seen: Approximately 10:21 PM  I have reviewed the triage vital signs and the nursing notes.   HISTORY  Chief Complaint Abdominal Pain and Emesis   HPI Marie Clarke is a 47 y.o. female POD 26 from ventral hernia repair at [facility] who presents for evaluation of abdominal pain.  Patient reports that she was doing well until this morning.  She started having nausea and had 3 episodes of nonbloody nonbilious emesis.  She also started complaining of pain in the right lower quadrant that she describes as a dull pulling pain, currently severe constant and nonradiating.  She is having normal bowel movements with the last one yesterday.  No diarrhea, no fever chills.   Past Medical History:  Diagnosis Date  . Anemia   . Anxiety   . Asthma   . Bipolar disorder ([place])   . CAD (coronary artery disease) unk  . CHF (congestive heart failure) ([place])   . COPD (chronic obstructive pulmonary disease) ([place])   . Depression unk  . Diabetes mellitus without complication ([place])   . Diabetes mellitus, type II ([place])   . Drug overdose   . GERD (gastroesophageal reflux disease)   . Headache   . Hyperlipidemia   . Hypertension   . Left leg pain 04/29/2014  . MI (myocardial infarction) ([place])   . Muscle ache 09/16/2014  . Osteoporosis   . Overactive bladder   . Pancreatitis unk  . Reflex sympathetic dystrophy   . Renal insufficiency   . Sleep apnea    pt reported on 2/6/7 she currently is not using CPAP  . Sleep apnea   . Stroke ([place])   . Thyroid disease   . TIA (transient ischemic attack) unk  . TIA (transient ischemic attack)     Patient Active Problem List   Diagnosis Date Noted  . Tobacco use disorder 07/16/2017  . Ventral hernia without obstruction or gangrene 06/25/2017  . Strain of extensor muscle, fascia and tendon of left index finger at wrist and hand level, initial  encounter 05/16/2017  . Sepsis ([place]) 04/14/2017  . Osteopenia 04/03/2017  . Hypotension 09/17/2016  . Contusion of knee, left 10/12/2015  . Sprain of left ankle 10/12/2015  . Strain of left knee 10/12/2015  . Ankle instability 07/20/2015  . Incidental lung nodule 04/28/2015  . Chronic pain 03/28/2015  . Chronic low back pain (Location of Primary Source of Pain) (Bilateral) (L>R) 03/28/2015  . Chronic lower extremity pain (Left) 03/28/2015  . Abdominal wound dehiscence 03/28/2015  . Encounter for pain management planning 03/28/2015  . Morbid obesity ([place]) 03/28/2015  . Abnormal CT scan, lumbar spine 03/28/2015  . Lumbar facet hypertrophy 03/28/2015  . Lumbar facet syndrome (Location of Primary Source of Pain) (Bilateral) (L>R) 03/28/2015  . Lumbar foraminal stenosis (Bilateral) (L5-S1) 03/28/2015  . Chronic ankle pain (Left) 03/28/2015  . CRPS (complex regional pain syndrome) type I of lower limb (left ankle) 03/28/2015  . Neurogenic pain 03/28/2015  . Neuropathic pain 03/28/2015  . Myofascial pain 03/28/2015  . History of suicide attempt 03/28/2015  . 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 Abnormal gait 12/15/2014  . Congestive heart failure ([place]) 11/15/2014  . Abdominal wall abscess 09/20/2014  . 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 Diabetes mellitus, type 2 ([place]) 08/13/2014  . Bipolar affective disorder ([place]) 08/13/2014  . Type 2 diabetes mellitus ([place]) 08/13/2014  . Rectal prolapse 08/09/2014  . Rectal bleeding 08/09/2014  . Rectal bleed  08/09/2014  . Affective bipolar disorder ([place]) 08/05/2014  . Arteriosclerosis of coronary artery 08/05/2014  . CCF (congestive cardiac failure) ([place]) 08/05/2014  . Chronic kidney disease 08/05/2014  . 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 Apnea, sleep 08/05/2014  . Temporary cerebral vascular dysfunction 08/05/2014  . Polypharmacy 04/29/2014  . Other long term (current) drug therapy 04/29/2014  . 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  Chronic kidney disease, stage III (moderate) ([place]) 12/14/2013  . Controlled diabetes mellitus type II without complication ([place]) 93/23/5573  . Essential (primary) hypertension 12/03/2013  . Adult hypothyroidism 12/03/2013  . Controlled type 2 diabetes mellitus without complication ([place]) 22/03/5425    Past Surgical History:  Procedure Laterality Date  . ABDOMINAL HYSTERECTOMY    . HERNIA REPAIR  07/15/2017  . prolapse rectum surgery N/A July 2016  . TONSILLECTOMY      Prior to Admission medications   Medication Sig Start Date End Date Taking? Authorizing Provider  acetaminophen (TYLENOL) 325 MG tablet Take 1 tablet (325 mg total) by mouth every 6 (six) hours as needed for mild pain (or Fever >/= 101). 09/18/16   Gouru, Aruna, MD  albuterol (PROVENTIL HFA;VENTOLIN HFA) 108 (90 BASE) MCG/ACT inhaler Inhale 2 puffs into the lungs every 4 (four) hours as needed for wheezing or shortness of breath.    [provider]  aspirin EC 81 MG tablet Take 81 mg by mouth daily at 12 noon.    [provider]  atorvastatin (LIPITOR) 40 MG tablet Take 40 mg by mouth at bedtime.     [provider]  azelastine (ASTELIN) 0.1 % nasal spray Place 1 spray into both nostrils daily.    [provider]  bumetanide (BUMEX) 2 MG tablet Take 1 tablet (2 mg total) by mouth 2 (two) times daily. 09/20/16 09/20/17  Nicholes Mango, MD  busPIRone (BUSPAR) 15 MG tablet Take 1 tablet (15 mg total) by mouth 3 (three) times daily. 08/05/17   Ursula Alert, MD  gabapentin (NEURONTIN) 600 MG tablet Take 1,200 mg by mouth 3 (three) times daily.  04/19/16   [provider]  glipiZIDE (GLUCOTROL XL) 10 MG 24 hr tablet Take 10 mg by mouth.  03/20/16 01/26/17  [provider]  HYDROcodone-acetaminophen (NORCO/VICODIN) 5-325 MG tablet Take 1 tablet by mouth every 6 (six) hours as needed for moderate pain. 07/08/17   [name], PA-C  hydrOXYzine (ATARAX/VISTARIL) 10 MG tablet Take 1-3  tablets (10-30 mg total) by mouth 2 (two) times daily as needed (for severe anxiety sx). 08/05/17   Ursula Alert, MD  ibuprofen (ADVIL,MOTRIN) 800 MG tablet Take 800 mg by mouth every 8 (eight) hours as needed.    [provider]  KLOR-CON M20 20 MEQ tablet Take 20 mEq by mouth daily.  10/13/15   [provider]  lamoTRIgine (LAMICTAL) 25 MG tablet Take 1-2 tablets (25-50 mg total) by mouth daily. Take 25 mg for 2 weeks and increase to 50 mg 08/05/17   Ursula Alert, MD  levothyroxine (SYNTHROID, LEVOTHROID) 200 MCG tablet Take 200 mcg by mouth daily.     [provider]  levothyroxine (SYNTHROID, LEVOTHROID) 25 MCG tablet Take 25 mcg by mouth daily before breakfast.  06/13/16 05/23/17  [provider]  methocarbamol (ROBAXIN) 500 MG tablet 1-2 tablets every 6 hours prn muscle spasms. 07/08/17   [name], PA-C  montelukast (SINGULAIR) 10 MG tablet Take 10 mg by mouth daily.     [provider]  ondansetron (ZOFRAN ODT) 4  MG disintegrating tablet Take 1 tablet (4 mg total) by mouth every 8 (eight) hours as needed for nausea or vomiting. 02/25/16   Earleen Newport, MD  ondansetron (ZOFRAN ODT) 4 MG disintegrating tablet Take 1 tablet (4 mg total) by mouth every 8 (eight) hours as needed for nausea or vomiting. 08/01/17   Earleen Newport, MD  oxyCODONE-acetaminophen (PERCOCET) 7.5-325 MG tablet Take 1 tablet by mouth every 4 (four) hours as needed for severe pain. 08/01/17 08/01/18  Earleen Newport, MD  oxyCODONE-acetaminophen (PERCOCET/ROXICET) 5-325 MG tablet Take 1 tablet by mouth every 4 (four) hours as needed for severe pain. 07/13/17   Paulette Blanch, MD  promethazine (PHENERGAN) 25 MG tablet Take 1 tablet (25 mg total) by mouth every 6 (six) hours as needed for nausea or vomiting. 08/01/17   Earleen Newport, MD  QUEtiapine (SEROQUEL) 25 MG tablet Take 3 tablets (75 mg total) by mouth at bedtime. 08/05/17   Ursula Alert, MD  ranitidine  (ZANTAC) 300 MG tablet Take 1 tablet (300 mg total) by mouth 2 (two) times daily. 11/30/14   Carrie Mew, MD  tiotropium (SPIRIVA) 18 MCG inhalation capsule Place 18 mcg into inhaler and inhale at bedtime.    [provider]    Allergies Diazepam; Sulfa antibiotics; Cephalexin; Ciprofloxacin; Flagyl [metronidazole]; Geodon [ziprasidone hcl]; Levofloxacin; Vancomycin; Penicillins; and Pregabalin  Family History  Problem Relation Age of Onset  . Diabetes Mellitus II Mother   . CAD Mother   . Sleep apnea Mother   . Osteoarthritis Mother   . Osteoporosis Mother   . Anxiety disorder Mother   . Depression Mother   . Bipolar disorder Mother   . Bipolar disorder Father   . Hypertension Father   . Depression Father   . Anxiety disorder Father   . Post-traumatic stress disorder Sister     Social History Social History   Tobacco Use  . Smoking status: Current Every Day Smoker    Packs/day: 1.00    Types: Cigarettes    Last attempt to quit: 06/12/2016    Years since quitting: 1.1  . Smokeless tobacco: Never Used  Substance Use Topics  . Alcohol use: No    Alcohol/week: 0.0 oz  . Drug use: No    Review of Systems  Constitutional: Negative for fever. Eyes: Negative for visual changes. ENT: Negative for sore throat. Neck: No neck pain  Cardiovascular: Negative for chest pain. Respiratory: Negative for shortness of breath. Gastrointestinal: + RLQ abdominal pain, nausea, and vomiting. No diarrhea. Genitourinary: Negative for dysuria. Musculoskeletal: Negative for back pain. Skin: Negative for rash. Neurological: Negative for headaches, weakness or numbness. Psych: No SI or HI  ____________________________________________   PHYSICAL EXAM:  VITAL SIGNS: ED Triage Vitals  Enc Vitals Group     BP 08/10/17 1818 (!) 152/86     Pulse Rate 08/10/17 1818 (!) 119     Resp 08/10/17 1818 18     Temp 08/10/17 1818 98.9 F (37.2 C)     Temp Source 08/10/17 1818 Oral       SpO2 08/10/17 1818 96 %     Weight 08/10/17 1819 225 lb (102.1 kg)     Height 08/10/17 1819 5\' 4"  (1.626 m)     Head Circumference --      Peak Flow --      Pain Score 08/10/17 1826 10     Pain Loc --      Pain Edu? --  Excl. in [place]? --     Constitutional: Alert and oriented. Well appearing and in no apparent distress. HEENT:      Head: Normocephalic and atraumatic.         Eyes: Conjunctivae are normal. Sclera is non-icteric.       Mouth/Throat: Mucous membranes are moist.       Neck: Supple with no signs of meningismus. Cardiovascular: Tachycardic with regular rhythm. No murmurs, gallops, or rubs. 2+ symmetrical distal pulses are present in all extremities. No JVD. Respiratory: Normal respiratory effort. Lungs are clear to auscultation bilaterally. No wheezes, crackles, or rhonchi.  Gastrointestinal: Soft, tender to palpation the right lower quadrant, well-healing surgical scar , and non distended with positive bowel sounds. No rebound or guarding. Genitourinary: No CVA tenderness. Musculoskeletal: Nontender with normal range of motion in all extremities. No edema, cyanosis, or erythema of extremities. Neurologic: Normal speech and language. Face is symmetric. Moving all extremities. No gross focal neurologic deficits are appreciated. Skin: Skin is warm, dry and intact. No rash noted. Psychiatric: Mood and affect are normal. Speech and behavior are normal.  ____________________________________________   LABS (all labs ordered are listed, but only abnormal results are displayed)  Labs Reviewed  COMPREHENSIVE METABOLIC PANEL - Abnormal; Notable for the following components:      Result Value   Glucose, Bld 108 (*)    Total Bilirubin 0.2 (*)    All other components within normal limits  CBC - Abnormal; Notable for the following components:   RBC 3.74 (*)    Hemoglobin 11.7 (*)    HCT 34.5 (*)    RDW 16.4 (*)    All other components within normal limits  URINALYSIS,  COMPLETE (UACMP) WITH MICROSCOPIC - Abnormal; Notable for the following components:   Color, Urine YELLOW (*)    APPearance CLEAR (*)    All other components within normal limits  LIPASE, BLOOD   ____________________________________________  EKG  none ____________________________________________  RADIOLOGY  I have personally reviewed the images performed during this visit and I agree with the Radiologist's read.   Interpretation by Radiologist:  Ct Abdomen Pelvis W Contrast  Result Date: 08/10/2017 CLINICAL DATA:  47 year old female with right lower quadrant abdominal pain, nausea vomiting. Status post recent umbilical hernia repair. EXAM: CT ABDOMEN AND PELVIS WITH CONTRAST TECHNIQUE: Multidetector CT imaging of the abdomen and pelvis was performed using the standard protocol following bolus administration of intravenous contrast. CONTRAST:  54mL OMNIPAQUE IOHEXOL 350 MG/ML SOLN COMPARISON:  CT of the abdomen pelvis dated 08/01/2017 FINDINGS: Lower chest: The visualized lung bases are clear. No intra-abdominal free air or free fluid. Hepatobiliary: No focal liver abnormality is seen. No gallstones, gallbladder wall thickening, or biliary dilatation. Pancreas: Unremarkable. No pancreatic ductal dilatation or surrounding inflammatory changes. Spleen: Normal in size without focal abnormality. Adrenals/Urinary Tract: There is a 2 cm indeterminate left adrenal nodule, likely an adenoma. The right adrenal gland is unremarkable. Subcentimeter left renal upper pole hypodense lesion is not well characterized but appears similar to prior CT studies, likely a cyst. There is no hydronephrosis on either side. There is symmetric enhancement of the kidneys by contrast. The visualized ureters and urinary bladder appear unremarkable. Stomach/Bowel: Postoperative changes of partial sigmoid resection with anastomotic suture. There is abutment of loops of small bowel to the ventral hernia repair mesh consistent  with adhesions. There is a focal area of abutment with slight 10 serration of the bowel through the anterior lower abdominal fascia/peritoneal or mesh (sagittal series 6  image [id number]) which may represent an area of adhesion with a degree of pinching of the bowel. There is no bowel obstruction or active inflammation. The appendix is normal. Vascular/Lymphatic: No significant vascular findings are present. No enlarged abdominal or pelvic lymph nodes. Reproductive: Hysterectomy.  No pelvic mass. Other: Postoperative changes of ventral hernia repair mesh and a midline vertical anterior pelvic wall incisional scar. There is inflammatory changes of the skin and subcutaneous fat in the anterior pelvic wall and deep subcutaneous fat superficial to the mesh. A 6 x 4 cm fluid collection anterior to the mesh in the subcutaneous soft tissues appear somewhat similar or slightly decreased in size compared to the prior CT and most likely represents a seroma. Superimposed infection is not excluded. Clinical correlation is recommended. Musculoskeletal: No acute or significant osseous findings. IMPRESSION: 1. Postsurgical changes of ventral hernia repair with mesh. There is abutment of loops of small bowel to the lower anterior peritoneum/mesh consistent with adhesions. Focal slightly more protruded appearance of a loop of small bowel into the mesh in the anterior pelvis also represent adhesion with possible associated pinching of the anterior wall of the bowel loop. No evidence of obstruction. 2. Inflammatory changes and stranding in the anterior pelvic wall with a seroma anterior to the mesh. Findings although this findings may be postoperative, superimposed infection is not excluded. Clinical correlation is recommended. Electronically Signed   By: Anner Crete M.D.   On: 08/10/2017 22:44      ____________________________________________   PROCEDURES  Procedure(s) performed: None Procedures Critical Care performed:   None ____________________________________________   INITIAL IMPRESSION / ASSESSMENT AND PLAN / ED COURSE   47 y.o. female POD 26 from ventral hernia repair at [facility] who presents for evaluation of abdominal pain, nausea and vomiting.  Patient is well-appearing, vital signs show tachycardia but afebrile normotensive, abdomen is soft with tenderness to palpation on the right lower quadrant, well-healing scar, positive bowel sounds.  Patient was seen here 9 days ago for abdominal pain and at that time had a CT showing 7 x 7 x 5 cm seroma.  She reports that the pain resolved and this is new since this morning.  Differential diagnosis includes intra-abdominal complication such as abscess, seroma, hematoma, SBO, diverticulitis, appendicitis,  GB disease.  Labs showing no leukocytosis, normal CMP, normal lipase, and UA negative for UTI.  I will give IV morphine, Zofran, fluids, and send patient for CT abdomen pelvis.  Clinical Course as of Aug 11 13  Sat Aug 10, 2017  2325 CT concerning for adhesions and possible small bowel loop touching the mesh.  Discussed with Dr. Burt Knack our surgeon on-call who will look at the CT and reports that this is normal findings 3 weeks postop and recommended the patient follows up with her surgeon.  Patient is tolerating p.o., passing flatus, and clinically no signs of obstruction.  No longer vomiting.  Patient has normal white count, no fever, therefore I believe the findings seen on the abdominal wall are consistent with postop seroma/hematoma and not infection.  At this time recommend close follow-up with her surgeon.  Discussed return precautions for any signs of SBO.    [CV]    Clinical Course User Index [CV] Alfred Levins [place], MD     As part of my medical decision making, I reviewed the following data within the [place] notes reviewed and incorporated, Labs reviewed , Old chart reviewed, Radiograph reviewed , A consult was requested and  obtained from  this/these consultant(s) Surgery, Notes from prior ED visits and [place] Controlled Substance Database    Pertinent labs & imaging results that were available during my care of the patient were reviewed by me and considered in my medical decision making (see chart for details).    ____________________________________________   FINAL CLINICAL IMPRESSION(S) / ED DIAGNOSES  Final diagnoses:  Post-op pain  Non-intractable vomiting with nausea, unspecified vomiting type  Abdominal wall seroma, subsequent encounter      NEW MEDICATIONS STARTED DURING THIS VISIT:  ED Discharge Orders    None       Note:  This document was prepared using Dragon voice recognition software and may include unintentional dictation errors.    Alfred Levins, [place], MD 08/11/17 [phone]

## 2017-08-10 NOTE — ED Triage Notes (Signed)
R lower abdominal pain, nausea and vomiting began today. States hx abdominal hernia repair 2 1/2 weeks ago.

## 2017-08-10 NOTE — ED Notes (Signed)
Patient transported to CT 

## 2017-08-10 NOTE — ED Notes (Signed)
Pt sitting in wheelchair, playing solitaire on cell phone; updated on wait; verbalized understanding;

## 2017-08-10 NOTE — ED Notes (Signed)
Pt returned from CT °

## 2017-08-10 NOTE — Discharge Instructions (Addendum)
Follow-up with your surgeon in 2 days.  Return to the emergency room if you have distention of the abdomen, pain, if you are not passing gas from below, vomiting, or fever.

## 2017-08-10 NOTE — ED Notes (Signed)
Updated patient regarding wait and ED process, verbalized understanding.

## 2017-08-11 DIAGNOSIS — I2510 Atherosclerotic heart disease of native coronary artery without angina pectoris: Secondary | ICD-10-CM | POA: Diagnosis not present

## 2017-08-12 DIAGNOSIS — G4733 Obstructive sleep apnea (adult) (pediatric): Secondary | ICD-10-CM | POA: Diagnosis not present

## 2017-08-12 DIAGNOSIS — I2510 Atherosclerotic heart disease of native coronary artery without angina pectoris: Secondary | ICD-10-CM | POA: Diagnosis not present

## 2017-08-13 DIAGNOSIS — I2510 Atherosclerotic heart disease of native coronary artery without angina pectoris: Secondary | ICD-10-CM | POA: Diagnosis not present

## 2017-08-14 DIAGNOSIS — I2510 Atherosclerotic heart disease of native coronary artery without angina pectoris: Secondary | ICD-10-CM | POA: Diagnosis not present

## 2017-08-14 DIAGNOSIS — G4733 Obstructive sleep apnea (adult) (pediatric): Secondary | ICD-10-CM | POA: Diagnosis not present

## 2017-08-15 DIAGNOSIS — I2510 Atherosclerotic heart disease of native coronary artery without angina pectoris: Secondary | ICD-10-CM | POA: Diagnosis not present

## 2017-08-16 DIAGNOSIS — I2510 Atherosclerotic heart disease of native coronary artery without angina pectoris: Secondary | ICD-10-CM | POA: Diagnosis not present

## 2017-08-17 DIAGNOSIS — I2510 Atherosclerotic heart disease of native coronary artery without angina pectoris: Secondary | ICD-10-CM | POA: Diagnosis not present

## 2017-08-18 DIAGNOSIS — I2510 Atherosclerotic heart disease of native coronary artery without angina pectoris: Secondary | ICD-10-CM | POA: Diagnosis not present

## 2017-08-19 DIAGNOSIS — I2510 Atherosclerotic heart disease of native coronary artery without angina pectoris: Secondary | ICD-10-CM | POA: Diagnosis not present

## 2017-08-20 DIAGNOSIS — I2510 Atherosclerotic heart disease of native coronary artery without angina pectoris: Secondary | ICD-10-CM | POA: Diagnosis not present

## 2017-08-21 DIAGNOSIS — I2510 Atherosclerotic heart disease of native coronary artery without angina pectoris: Secondary | ICD-10-CM | POA: Diagnosis not present

## 2017-08-22 DIAGNOSIS — I2510 Atherosclerotic heart disease of native coronary artery without angina pectoris: Secondary | ICD-10-CM | POA: Diagnosis not present

## 2017-08-23 DIAGNOSIS — T888XXS Other specified complications of surgical and medical care, not elsewhere classified, sequela: Secondary | ICD-10-CM | POA: Diagnosis not present

## 2017-08-23 DIAGNOSIS — I2510 Atherosclerotic heart disease of native coronary artery without angina pectoris: Secondary | ICD-10-CM | POA: Diagnosis not present

## 2017-08-23 DIAGNOSIS — T792XXS Traumatic secondary and recurrent hemorrhage and seroma, sequela: Secondary | ICD-10-CM | POA: Diagnosis not present

## 2017-08-24 DIAGNOSIS — I2510 Atherosclerotic heart disease of native coronary artery without angina pectoris: Secondary | ICD-10-CM | POA: Diagnosis not present

## 2017-08-25 DIAGNOSIS — I2510 Atherosclerotic heart disease of native coronary artery without angina pectoris: Secondary | ICD-10-CM | POA: Diagnosis not present

## 2017-08-26 ENCOUNTER — Ambulatory Visit: Payer: Medicare HMO | Admitting: Nurse Practitioner

## 2017-08-26 DIAGNOSIS — I2510 Atherosclerotic heart disease of native coronary artery without angina pectoris: Secondary | ICD-10-CM | POA: Diagnosis not present

## 2017-08-27 DIAGNOSIS — I2510 Atherosclerotic heart disease of native coronary artery without angina pectoris: Secondary | ICD-10-CM | POA: Diagnosis not present

## 2017-08-28 DIAGNOSIS — I2510 Atherosclerotic heart disease of native coronary artery without angina pectoris: Secondary | ICD-10-CM | POA: Diagnosis not present

## 2017-08-29 DIAGNOSIS — I2510 Atherosclerotic heart disease of native coronary artery without angina pectoris: Secondary | ICD-10-CM | POA: Diagnosis not present

## 2017-08-30 DIAGNOSIS — I2510 Atherosclerotic heart disease of native coronary artery without angina pectoris: Secondary | ICD-10-CM | POA: Diagnosis not present

## 2017-08-31 DIAGNOSIS — I2510 Atherosclerotic heart disease of native coronary artery without angina pectoris: Secondary | ICD-10-CM | POA: Diagnosis not present

## 2017-09-01 DIAGNOSIS — I2510 Atherosclerotic heart disease of native coronary artery without angina pectoris: Secondary | ICD-10-CM | POA: Diagnosis not present

## 2017-09-02 DIAGNOSIS — I2510 Atherosclerotic heart disease of native coronary artery without angina pectoris: Secondary | ICD-10-CM | POA: Diagnosis not present

## 2017-09-03 DIAGNOSIS — I2510 Atherosclerotic heart disease of native coronary artery without angina pectoris: Secondary | ICD-10-CM | POA: Diagnosis not present

## 2017-09-04 DIAGNOSIS — I2510 Atherosclerotic heart disease of native coronary artery without angina pectoris: Secondary | ICD-10-CM | POA: Diagnosis not present

## 2017-09-05 ENCOUNTER — Other Ambulatory Visit: Payer: Self-pay | Admitting: Surgery

## 2017-09-05 DIAGNOSIS — I2510 Atherosclerotic heart disease of native coronary artery without angina pectoris: Secondary | ICD-10-CM | POA: Diagnosis not present

## 2017-09-05 DIAGNOSIS — R109 Unspecified abdominal pain: Secondary | ICD-10-CM

## 2017-09-05 DIAGNOSIS — T888XXS Other specified complications of surgical and medical care, not elsewhere classified, sequela: Secondary | ICD-10-CM | POA: Diagnosis not present

## 2017-09-05 DIAGNOSIS — T888XXA Other specified complications of surgical and medical care, not elsewhere classified, initial encounter: Secondary | ICD-10-CM | POA: Diagnosis not present

## 2017-09-05 DIAGNOSIS — T792XXS Traumatic secondary and recurrent hemorrhage and seroma, sequela: Secondary | ICD-10-CM | POA: Diagnosis not present

## 2017-09-06 ENCOUNTER — Ambulatory Visit
Admission: RE | Admit: 2017-09-06 | Discharge: 2017-09-06 | Disposition: A | Discharge status: Home / Self Care | Payer: Medicare HMO | Source: Ambulatory Visit | Attending: Surgery | Admitting: Surgery

## 2017-09-06 DIAGNOSIS — Z9889 Other specified postprocedural states: Secondary | ICD-10-CM | POA: Insufficient documentation

## 2017-09-06 DIAGNOSIS — I2510 Atherosclerotic heart disease of native coronary artery without angina pectoris: Secondary | ICD-10-CM | POA: Diagnosis not present

## 2017-09-06 DIAGNOSIS — K439 Ventral hernia without obstruction or gangrene: Secondary | ICD-10-CM | POA: Diagnosis not present

## 2017-09-06 DIAGNOSIS — R109 Unspecified abdominal pain: Secondary | ICD-10-CM | POA: Diagnosis not present

## 2017-09-06 MED ORDER — IOPAMIDOL (ISOVUE-300) INJECTION 61%
100.0000 mL | Freq: Once | INTRAVENOUS | Status: AC | PRN
  Administered 2017-09-06: 100 mL via INTRAVENOUS

## 2017-09-07 DIAGNOSIS — I2510 Atherosclerotic heart disease of native coronary artery without angina pectoris: Secondary | ICD-10-CM | POA: Diagnosis not present

## 2017-09-08 DIAGNOSIS — I2510 Atherosclerotic heart disease of native coronary artery without angina pectoris: Secondary | ICD-10-CM | POA: Diagnosis not present

## 2017-09-09 ENCOUNTER — Ambulatory Visit: Payer: Medicare HMO | Attending: Nurse Practitioner | Admitting: Nurse Practitioner

## 2017-09-09 ENCOUNTER — Encounter: Payer: Self-pay | Admitting: Nurse Practitioner

## 2017-09-09 ENCOUNTER — Other Ambulatory Visit: Payer: Self-pay

## 2017-09-09 VITALS — BP 112/95 | HR 102 | Temp 97.7°F | Resp 18 | Ht 64.0 in | Wt 235.0 lb

## 2017-09-09 DIAGNOSIS — M25572 Pain in left ankle and joints of left foot: Secondary | ICD-10-CM

## 2017-09-09 DIAGNOSIS — G894 Chronic pain syndrome: Secondary | ICD-10-CM | POA: Diagnosis not present

## 2017-09-09 DIAGNOSIS — F419 Anxiety disorder, unspecified: Secondary | ICD-10-CM | POA: Insufficient documentation

## 2017-09-09 DIAGNOSIS — I509 Heart failure, unspecified: Secondary | ICD-10-CM | POA: Diagnosis not present

## 2017-09-09 DIAGNOSIS — Z789 Other specified health status: Secondary | ICD-10-CM | POA: Insufficient documentation

## 2017-09-09 DIAGNOSIS — E039 Hypothyroidism, unspecified: Secondary | ICD-10-CM | POA: Diagnosis not present

## 2017-09-09 DIAGNOSIS — Z79899 Other long term (current) drug therapy: Secondary | ICD-10-CM

## 2017-09-09 DIAGNOSIS — Z7982 Long term (current) use of aspirin: Secondary | ICD-10-CM | POA: Insufficient documentation

## 2017-09-09 DIAGNOSIS — Z833 Family history of diabetes mellitus: Secondary | ICD-10-CM | POA: Insufficient documentation

## 2017-09-09 DIAGNOSIS — K219 Gastro-esophageal reflux disease without esophagitis: Secondary | ICD-10-CM | POA: Insufficient documentation

## 2017-09-09 DIAGNOSIS — M545 Low back pain: Secondary | ICD-10-CM | POA: Diagnosis not present

## 2017-09-09 DIAGNOSIS — I130 Hypertensive heart and chronic kidney disease with heart failure and stage 1 through stage 4 chronic kidney disease, or unspecified chronic kidney disease: Secondary | ICD-10-CM | POA: Diagnosis not present

## 2017-09-09 DIAGNOSIS — I2510 Atherosclerotic heart disease of native coronary artery without angina pectoris: Secondary | ICD-10-CM | POA: Insufficient documentation

## 2017-09-09 DIAGNOSIS — M810 Age-related osteoporosis without current pathological fracture: Secondary | ICD-10-CM | POA: Diagnosis not present

## 2017-09-09 DIAGNOSIS — M25512 Pain in left shoulder: Secondary | ICD-10-CM | POA: Diagnosis not present

## 2017-09-09 DIAGNOSIS — F319 Bipolar disorder, unspecified: Secondary | ICD-10-CM | POA: Insufficient documentation

## 2017-09-09 DIAGNOSIS — F17210 Nicotine dependence, cigarettes, uncomplicated: Secondary | ICD-10-CM | POA: Insufficient documentation

## 2017-09-09 DIAGNOSIS — M899 Disorder of bone, unspecified: Secondary | ICD-10-CM

## 2017-09-09 DIAGNOSIS — Z79891 Long term (current) use of opiate analgesic: Secondary | ICD-10-CM | POA: Diagnosis not present

## 2017-09-09 DIAGNOSIS — M4807 Spinal stenosis, lumbosacral region: Secondary | ICD-10-CM | POA: Diagnosis not present

## 2017-09-09 DIAGNOSIS — R700 Elevated erythrocyte sedimentation rate: Secondary | ICD-10-CM | POA: Diagnosis not present

## 2017-09-09 DIAGNOSIS — M48061 Spinal stenosis, lumbar region without neurogenic claudication: Secondary | ICD-10-CM | POA: Insufficient documentation

## 2017-09-09 DIAGNOSIS — M79605 Pain in left leg: Secondary | ICD-10-CM

## 2017-09-09 DIAGNOSIS — M8938 Hypertrophy of bone, other site: Secondary | ICD-10-CM | POA: Insufficient documentation

## 2017-09-09 DIAGNOSIS — Z5181 Encounter for therapeutic drug level monitoring: Secondary | ICD-10-CM | POA: Insufficient documentation

## 2017-09-09 DIAGNOSIS — Z818 Family history of other mental and behavioral disorders: Secondary | ICD-10-CM | POA: Insufficient documentation

## 2017-09-09 DIAGNOSIS — Z8249 Family history of ischemic heart disease and other diseases of the circulatory system: Secondary | ICD-10-CM | POA: Insufficient documentation

## 2017-09-09 DIAGNOSIS — Z6841 Body Mass Index (BMI) 40.0 and over, adult: Secondary | ICD-10-CM | POA: Insufficient documentation

## 2017-09-09 DIAGNOSIS — Z8673 Personal history of transient ischemic attack (TIA), and cerebral infarction without residual deficits: Secondary | ICD-10-CM | POA: Insufficient documentation

## 2017-09-09 DIAGNOSIS — R7982 Elevated C-reactive protein (CRP): Secondary | ICD-10-CM | POA: Diagnosis not present

## 2017-09-09 DIAGNOSIS — D631 Anemia in chronic kidney disease: Secondary | ICD-10-CM | POA: Insufficient documentation

## 2017-09-09 DIAGNOSIS — J449 Chronic obstructive pulmonary disease, unspecified: Secondary | ICD-10-CM | POA: Insufficient documentation

## 2017-09-09 DIAGNOSIS — M5442 Lumbago with sciatica, left side: Secondary | ICD-10-CM | POA: Diagnosis not present

## 2017-09-09 DIAGNOSIS — N183 Chronic kidney disease, stage 3 (moderate): Secondary | ICD-10-CM | POA: Insufficient documentation

## 2017-09-09 DIAGNOSIS — I252 Old myocardial infarction: Secondary | ICD-10-CM | POA: Insufficient documentation

## 2017-09-09 DIAGNOSIS — E785 Hyperlipidemia, unspecified: Secondary | ICD-10-CM | POA: Insufficient documentation

## 2017-09-09 DIAGNOSIS — M542 Cervicalgia: Secondary | ICD-10-CM

## 2017-09-09 DIAGNOSIS — Z79890 Hormone replacement therapy (postmenopausal): Secondary | ICD-10-CM | POA: Insufficient documentation

## 2017-09-09 DIAGNOSIS — Z7984 Long term (current) use of oral hypoglycemic drugs: Secondary | ICD-10-CM | POA: Insufficient documentation

## 2017-09-09 DIAGNOSIS — G8929 Other chronic pain: Secondary | ICD-10-CM

## 2017-09-09 DIAGNOSIS — E1140 Type 2 diabetes mellitus with diabetic neuropathy, unspecified: Secondary | ICD-10-CM | POA: Insufficient documentation

## 2017-09-09 DIAGNOSIS — Z791 Long term (current) use of non-steroidal anti-inflammatories (NSAID): Secondary | ICD-10-CM | POA: Insufficient documentation

## 2017-09-09 DIAGNOSIS — E1122 Type 2 diabetes mellitus with diabetic chronic kidney disease: Secondary | ICD-10-CM | POA: Diagnosis not present

## 2017-09-09 DIAGNOSIS — G4730 Sleep apnea, unspecified: Secondary | ICD-10-CM | POA: Insufficient documentation

## 2017-09-09 NOTE — Patient Instructions (Addendum)
You have been instructed to get labwork today and return for 2nd visit with Dr Dossie Arbour.  ____________________________________________________________________________________________  Appointment Policy Summary  It is our goal and responsibility to provide the medical community with assistance in the evaluation and management of patients with chronic pain. Unfortunately our resources are limited. Because we do not have an unlimited amount of time, or available appointments, we are required to closely monitor and manage their use. The following rules exist to maximize their use:  Patient's responsibilities: 1. Punctuality:  At what time should I arrive? You should be physically present in our office 30 minutes before your scheduled appointment. Your scheduled appointment is with your assigned healthcare provider. However, it takes 5-10 minutes to be "checked-in", and another 15 minutes for the nurses to do the admission. If you arrive to our office at the time you were given for your appointment, you will end up being at least 20-25 minutes late to your appointment with the provider. 2. Tardiness:  What happens if I arrive only a few minutes after my scheduled appointment time? You will need to reschedule your appointment. The cutoff is your appointment time. This is why it is so important that you arrive at least 30 minutes before that appointment. If you have an appointment scheduled for 10:00 AM and you arrive at 10:01, you will be required to reschedule your appointment.  3. Plan ahead:  Always assume that you will encounter traffic on your way in. Plan for it. If you are dependent on a driver, make sure they understand these rules and the need to arrive early. 4. Other appointments and responsibilities:  Avoid scheduling any other appointments before or after your pain clinic appointments.  5. Be prepared:  Write down everything that you need to discuss with your healthcare provider and give this  information to the admitting nurse. Write down the medications that you will need refilled. Bring your pills and bottles (even the empty ones), to all of your appointments, except for those where a procedure is scheduled. 6. No children or pets:  Find someone to take care of them. It is not appropriate to bring them in. 7. Scheduling changes:  We request "advanced notification" of any changes or cancellations. 8. Advanced notification:  Defined as a time period of more than 24 hours prior to the originally scheduled appointment. This allows for the appointment to be offered to other patients. 9. Rescheduling:  When a visit is rescheduled, it will require the cancellation of the original appointment. For this reason they both fall within the category of "Cancellations".  10. Cancellations:  They require advanced notification. Any cancellation less than 24 hours before the  appointment will be recorded as a "No Show". 11. No Show:  Defined as an unkept appointment where the patient failed to notify or declare to the practice their intention or inability to keep the appointment.  Corrective process for repeat offenders:  1. Tardiness: Three (3) episodes of rescheduling due to late arrivals will be recorded as one (1) "No Show". 2. Cancellation or reschedule: Three (3) cancellations or rescheduling will be recorded as one (1) "No Show". 3. "No Shows": Three (3) "No Shows" within a 12 month period will result in discharge from the practice. ____________________________________________________________________________________________  ____________________________________________________________________________________________  Pain Scale  Introduction: The pain score used by this practice is the Verbal Numerical Rating Scale (VNRS-11). This is an 11-point scale. It is for adults and children 10 years or older. There are significant differences in how  the pain score is reported, used, and applied.  Forget everything you learned in the past and learn this scoring system.  General Information: The scale should reflect your current level of pain. Unless you are specifically asked for the level of your worst pain, or your average pain. If you are asked for one of these two, then it should be understood that it is over the past 24 hours.  Basic Activities of Daily Living (ADL): Personal hygiene, dressing, eating, transferring, and using restroom.  Instructions: Most patients tend to report their level of pain as a combination of two factors, their physical pain and their psychosocial pain. This last one is also known as "suffering" and it is reflection of how physical pain affects you socially and psychologically. From now on, report them separately. From this point on, when asked to report your pain level, report only your physical pain. Use the following table for reference.  Pain Clinic Pain Levels (0-5/10)  Pain Level Score  Description  No Pain 0   Mild pain 1 Nagging, annoying, but does not interfere with basic activities of daily living (ADL). Patients are able to eat, bathe, get dressed, toileting (being able to get on and off the toilet and perform personal hygiene functions), transfer (move in and out of bed or a chair without assistance), and maintain continence (able to control bladder and bowel functions). Blood pressure and heart rate are unaffected. A normal heart rate for a healthy adult ranges from 60 to 100 bpm (beats per minute).   Mild to moderate pain 2 Noticeable and distracting. Impossible to hide from other people. More frequent flare-ups. Still possible to adapt and function close to normal. It can be very annoying and may have occasional stronger flare-ups. With discipline, patients may get used to it and adapt.   Moderate pain 3 Interferes significantly with activities of daily living (ADL). It becomes difficult to feed, bathe, get dressed, get on and off the toilet or to  perform personal hygiene functions. Difficult to get in and out of bed or a chair without assistance. Very distracting. With effort, it can be ignored when deeply involved in activities.   Moderately severe pain 4 Impossible to ignore for more than a few minutes. With effort, patients may still be able to manage work or participate in some social activities. Very difficult to concentrate. Signs of autonomic nervous system discharge are evident: dilated pupils (mydriasis); mild sweating (diaphoresis); sleep interference. Heart rate becomes elevated (>115 bpm). Diastolic blood pressure (lower number) rises above 100 mmHg. Patients find relief in laying down and not moving.   Severe pain 5 Intense and extremely unpleasant. Associated with frowning face and frequent crying. Pain overwhelms the senses.  Ability to do any activity or maintain social relationships becomes significantly limited. Conversation becomes difficult. Pacing back and forth is common, as getting into a comfortable position is nearly impossible. Pain wakes you up from deep sleep. Physical signs will be obvious: pupillary dilation; increased sweating; goosebumps; brisk reflexes; cold, clammy hands and feet; nausea, vomiting or dry heaves; loss of appetite; significant sleep disturbance with inability to fall asleep or to remain asleep. When persistent, significant weight loss is observed due to the complete loss of appetite and sleep deprivation.  Blood pressure and heart rate becomes significantly elevated. Caution: If elevated blood pressure triggers a pounding headache associated with blurred vision, then the patient should immediately seek attention at an urgent or emergency care unit, as these may be signs of  an impending stroke.    Emergency Department Pain Levels (6-10/10)  Emergency Room Pain 6 Severely limiting. Requires emergency care and should not be seen or managed at an outpatient pain management facility. Communication becomes  difficult and requires great effort. Assistance to reach the emergency department may be required. Facial flushing and profuse sweating along with potentially dangerous increases in heart rate and blood pressure will be evident.   Distressing pain 7 Self-care is very difficult. Assistance is required to transport, or use restroom. Assistance to reach the emergency department will be required. Tasks requiring coordination, such as bathing and getting dressed become very difficult.   Disabling pain 8 Self-care is no longer possible. At this level, pain is disabling. The individual is unable to do even the most "basic" activities such as walking, eating, bathing, dressing, transferring to a bed, or toileting. Fine motor skills are lost. It is difficult to think clearly.   Incapacitating pain 9 Pain becomes incapacitating. Thought processing is no longer possible. Difficult to remember your own name. Control of movement and coordination are lost.   The worst pain imaginable 10 At this level, most patients pass out from pain. When this level is reached, collapse of the autonomic nervous system occurs, leading to a sudden drop in blood pressure and heart rate. This in turn results in a temporary and dramatic drop in blood flow to the brain, leading to a loss of consciousness. Fainting is one of the body's self defense mechanisms. Passing out puts the brain in a calmed state and causes it to shut down for a while, in order to begin the healing process.    Summary: 1. Refer to this scale when providing [place] with your pain level. 2. Be accurate and careful when reporting your pain level. This will help with your care. 3. Over-reporting your pain level will lead to loss of credibility. 4. Even a level of 1/10 means that there is pain and will be treated at our facility. 5. High, inaccurate reporting will be documented as "Symptom Exaggeration", leading to loss of credibility and suspicions of possible secondary  gains such as obtaining more narcotics, or wanting to appear disabled, for fraudulent reasons. 6. Only pain levels of 5 or below will be seen at our facility. 7. Pain levels of 6 and above will be sent to the Emergency Department and the appointment cancelled. ____________________________________________________________________________________________    BMI Assessment: Estimated body mass index is 40.34 kg/m as calculated from the following:   Height as of this encounter: 5\' 4"  (1.626 m).   Weight as of this encounter: 235 lb (106.6 kg).  BMI interpretation table: BMI level Category Range association with higher incidence of chronic pain  <18 kg/m2 Underweight   18.5-24.9 kg/m2 Ideal body weight   25-29.9 kg/m2 Overweight Increased incidence by 20%  30-34.9 kg/m2 Obese (Class I) Increased incidence by 68%  35-39.9 kg/m2 Severe obesity (Class II) Increased incidence by 136%  >40 kg/m2 Extreme obesity (Class III) Increased incidence by 254%   BMI Readings from Last 4 Encounters:  09/09/17 40.34 kg/m  08/10/17 38.62 kg/m  08/01/17 39.48 kg/m  07/08/17 36.05 kg/m   Wt Readings from Last 4 Encounters:  09/09/17 235 lb (106.6 kg)  08/10/17 225 lb (102.1 kg)  08/01/17 230 lb (104.3 kg)  07/08/17 210 lb (95.3 kg)

## 2017-09-09 NOTE — Progress Notes (Signed)
Patient's Name: Marie Clarke  MRN: [id number]  Referring Provider: Sharyne Peach, MD  DOB: September 09, 1970  PCP: Sharyne Peach, MD  DOS: 09/09/2017  Note by: Dionisio David NP  Service setting: Ambulatory outpatient  Specialty: Interventional Pain Management  Location: [facility] (AMB) Pain Management Facility    Patient type: New Patient    Primary Reason(s) for Visit: Initial Patient Evaluation CC: Back Pain (low, left); Neck Pain (left); Shoulder Pain (left); and Ankle Pain (left)  HPI  Marie Clarke is a 47 y.o. year old, female patient, who comes today for an initial evaluation. She has Affective bipolar disorder ([place]); Arteriosclerosis of coronary artery; CCF (congestive cardiac failure) ([place]); Chronic kidney disease, stage III (moderate) ([place]); Chronic kidney disease; Controlled diabetes mellitus type II without complication ([place]); Essential (primary) hypertension; Polypharmacy; Adult hypothyroidism; Detrusor muscle hypertonia; Algodystrophic syndrome; Apnea, sleep; Temporary cerebral vascular dysfunction; Rectal prolapse; Rectal bleeding; Rectal bleed; Detrusor dyssynergia; Diabetes mellitus, type 2 ([place]); Abdominal wall abscess; Bipolar affective disorder ([place]); Congestive heart failure ([place]); Controlled type 2 diabetes mellitus without complication ([place]); Other long term (current) drug therapy; Type 2 diabetes mellitus ([place]); Abnormal gait; Neurosis, posttraumatic; Chronic pain; Chronic low back pain (Location of Primary Source of Pain) (Bilateral) (L>R); Chronic lower extremity pain (Left); Abdominal wound dehiscence; Encounter for pain management planning; Morbid obesity ([place]); Abnormal CT scan, lumbar spine; Lumbar facet hypertrophy; Lumbar facet syndrome (Location of Primary Source of Pain) (Bilateral) (L>R); Lumbar foraminal stenosis (Bilateral) (L5-S1); Chronic ankle pain (Left); CRPS (complex regional pain syndrome) type I of lower limb (left ankle); Neurogenic pain; Neuropathic pain; Myofascial pain;  History of suicide attempt; Ankle instability; Incidental lung nodule; Contusion of knee, left; Sprain of left ankle; Strain of left knee; Hypotension; Sepsis ([place]); Osteopenia; Strain of extensor muscle, fascia and tendon of left index finger at wrist and hand level, initial encounter; Tobacco use disorder; Ventral hernia without obstruction or gangrene; Chronic neck pain (Fourth Area of Pain) (L>R); Chronic left shoulder pain; Pharmacologic therapy; Disorder of skeletal system; Problems influencing health status; and Long term current use of opiate analgesic on their problem list.. Her primarily concern today is the Back Pain (low, left); Neck Pain (left); Shoulder Pain (left); and Ankle Pain (left)  Pain Assessment: Location: Lower Back Radiating: radiates into left leg into ankle Onset: More than a month ago Duration: Chronic pain Quality: Aching, Sharp, Stabbing, Shooting, Burning, Constant(States " all of the above") Severity: 10-Worst pain ever/10 (subjective, self-reported pain score)  Note: Reported level is compatible with observation. Clinically the patient looks like a 2/10 A 2/10 is viewed as "Mild to Moderate" and described as noticeable and distracting. Impossible to hide from other people. More frequent flare-ups. Still possible to adapt and function close to normal. It can be very annoying and may have occasional stronger flare-ups. With discipline, patients may get used to it and adapt. Information on the proper use of the pain scale provided to the patient today. When using our objective Pain Scale, levels between 6 and 10/10 are said to belong in an emergency room, as it progressively worsens from a 6/10, described as severely limiting, requiring emergency care not usually available at an outpatient pain management facility. At a 6/10 level, communication becomes difficult and requires great effort. Assistance to reach the emergency department may be required. Facial flushing and profuse  sweating along with potentially dangerous increases in heart rate and blood pressure will be evident. Effect on ADL: "I dont feel like doing anything" Timing: Constant Modifying factors:  meds, epidurals  BP: (!) 112/95  HR: (!) 102  Onset and Duration: Present longer than 3 months Cause of pain: Unknown Severity: Getting worse, NAS-11 at its worse: 10/10, NAS-11 at its best: 8/10, NAS-11 now: 9/10 and NAS-11 on the average: 10/10 Timing: Not influenced by the time of the day Aggravating Factors: Bending, Climbing, Kneeling, Lifiting, Prolonged sitting, Prolonged standing, Squatting, Stooping , Twisting and Walking Alleviating Factors: Medications Associated Problems: Night-time cramps, Depression, Fatigue, Inability to control bladder (urine), Inability to control bowel, Nausea, Spasms, Sweating, Pain that wakes patient up and Pain that does not allow patient to sleep Quality of Pain: Aching, Annoying, Burning, Hot, Throbbing, Toothache-like and Uncomfortable Previous Examinations or Tests: CT scan, X-rays and Psychiatric evaluation Previous Treatments: Biofeedback, Epidural steroid injections, Narcotic medications, Relaxation therapy, TENS and Traction  The patient comes into the clinics today for the first time for a chronic pain management evaluation.  According to the patient her primary area pain is in her lower back.  She denies any precipitating factors.  She admits that the left is greater than the right.  She denies any previous surgery.  She admits that she did have an epidural steroid injection while in [place] 6-7 years ago.  She admits that it was effective.  She has had physical therapy in the past she completed approximately 3 sessions she admits that it was not effective.  She has had a recent MRI.  Her second area of pain is in her legs.  She admits that she has pain that goes down her left leg into her ankle.  She has numbness tingling burning and aching pain.  She denies  any weakness.  Her third area pains in her left ankle.  She admits that she has a history of a left ankle fracture.  She is unsure of the exact date of injury.  She did not have any type of surgery.  She admits that she has been diagnosed with complex regional pain syndrome.  She denies any interventional therapy, physical therapy. Xray 08/2016.  Her fourth area of pain is in her neck.  She admits the left side is greater than the right.  She denies any previous surgery, interventional therapy or physical therapy.  She has had recent images.  Her last area of pain is in her shoulders.  She admits that the left is greater than the right.  She has pain that goes down into her elbow.  She denies any numbness tingling or weakness in her left arm.  She denies any previous surgery, interventional therapy or recent physical therapy.  She has had previous images of her shoulder.  She was seen here as a patient in 2017 however has never received any treatment.  She was referred here from [facility] pain clinic secondary to travel distance.  Today I took the time to provide the patient with information regarding this pain practice. The patient was informed that the practice is divided into two sections: an interventional pain management section, as well as a completely separate and distinct medication management section. I explained that there are procedure days for interventional therapies, and evaluation days for follow-ups and medication management. Because of the amount of documentation required during both, they are kept separated. This means that there is the possibility that she may be scheduled for a procedure on one day, and medication management the next. I have also informed her that because of staffing and facility limitations, this practice will no  longer take patients for medication management only. To illustrate the reasons for this, I gave the patient the example of surgeons, and  how inappropriate it would be to refer a patient to his/her care, just to write for the post-surgical antibiotics on a surgery done by a different surgeon.   Because interventional pain management is part of the board-certified specialty for the doctors, the patient was informed that joining this practice means that they are open to any and all interventional therapies. I made it clear that this does not mean that they will be forced to have any procedures done. What this means is that I believe interventional therapies to be essential part of the diagnosis and proper management of chronic pain conditions. Therefore, patients not interested in these interventional alternatives will be better served under the care of a different practitioner.  The patient was also made aware of my Comprehensive Pain Management Safety Guidelines where by joining this practice, they limit all of their nerve blocks and joint injections to those done by our practice, for as long as we are retained to manage their care. Historic Controlled Substance Pharmacotherapy Review  PMP and historical list of controlled substances: Tramadol 50 mg, oxycodone/acetaminophen 7.5/325 mg, oxycodone 10 mg, lorazepam 1 mg, oxycodone/acetaminophen 5/325 mg, Tylenol with Codeine No. 3, hydrocodone/acetaminophen 5/325 mg, diphenoxylate/atropine 2.5/0.025 mg, Lyrica 200 mg, Lyrica 100 mg, Lyrica 75 mg, diazepam 10 mg, Highest opioid analgesic regimen found: Oxycodone 10 mg 1 tablet every 4 hours (last fill date 09/14/2014) oxycodone 60 mg/day Most recent opioid analgesic: Tramadol 50 mg 1 tablet 4 times daily (fill date 09/02/2017) tramadol 200 mg/day Current opioid analgesics: Tramadol 50 mg 1 tablet 4 times daily (fill date 09/02/2017) tramadol 200 mg/day Highest recorded MME/day: 90 mg/day MME/day: 20 mg/day Medications: The patient did not bring the medication(s) to the appointment, as requested in our "New Patient  Package" Pharmacodynamics: Desired effects: Analgesia: The patient reports >50% benefit. Reported improvement in function: The patient reports medication allows her to accomplish basic ADLs. Clinically meaningful improvement in function (CMIF): Sustained CMIF goals met Perceived effectiveness: Described as relatively effective, allowing for increase in activities of daily living (ADL) Undesirable effects: Side-effects or Adverse reactions: None reported Historical Monitoring: The patient  reports that she does not use drugs. List of all UDS Test(s): Lab Results  Component Value Date   MDMA NONE DETECTED 09/17/2016   MDMA NONE DETECTED 06/24/2016   COCAINSCRNUR NONE DETECTED 09/17/2016   COCAINSCRNUR NONE DETECTED 06/24/2016   PCPSCRNUR NONE DETECTED 09/17/2016   PCPSCRNUR NONE DETECTED 06/24/2016   THCU NONE DETECTED 09/17/2016   THCU NONE DETECTED 06/24/2016   ETH <5 06/24/2016   List of all Serum Drug Screening Test(s):  No results found for: AMPHSCRSER, BARBSCRSER, BENZOSCRSER, COCAINSCRSER, PCPSCRSER, PCPQUANT, THCSCRSER, CANNABQUANT, OPIATESCRSER, OXYSCRSER, PROPOXSCRSER Historical Background Evaluation: [place] PDMP: Six (6) year initial data search conducted. Abnormal pattern detected. A pattern of multiple prescribers and multiple pharmacies was identified [place] Department of public safety, offender search: Editor, commissioning Information) No criminal record(s) found Risk Assessment Profile: Aberrant behavior: None observed or detected today Risk factors for fatal opioid overdose: age 46-21 years old, Benzodiazepine use, bipolar disorder, caucasian, history of attempted suicide , kidney disease and multiple prescribers Fatal overdose hazard ratio (HR): Calculation deferred Non-fatal overdose hazard ratio (HR): Calculation deferred Risk of opioid abuse or dependence: 0.7-3.0% with doses ? 36 MME/day and 6.1-26% with doses ? 120 MME/day. Substance use disorder (SUD) risk level: High Opioid risk  tool (ORT) (Total  Score): 7  ORT Scoring interpretation table:  Score <3 = Low Risk for SUD  Score between 4-7 = Moderate Risk for SUD  Score >8 = High Risk for Opioid Abuse   PHQ-2 Depression Scale:  Total score: 0  PHQ-2 Scoring interpretation table: (Score and probability of major depressive disorder)  Score 0 = No depression  Score 1 = 15.4% Probability  Score 2 = 21.1% Probability  Score 3 = 38.4% Probability  Score 4 = 45.5% Probability  Score 5 = 56.4% Probability  Score 6 = 78.6% Probability   PHQ-9 Depression Scale:  Total score: 0  PHQ-9 Scoring interpretation table:  Score 0-4 = No depression  Score 5-9 = Mild depression  Score 10-14 = Moderate depression  Score 15-19 = Moderately severe depression  Score 20-27 = Severe depression (2.4 times higher risk of SUD and 2.89 times higher risk of overuse)   Pharmacologic Plan: Pending ordered tests and/or consults  Meds  The patient has a current medication list which includes the following prescription(s): acetaminophen, aspirin ec, atorvastatin, azelastine, bumetanide, buspirone, [facility] nicotine polacrilex, gabapentin, hydroxyzine, ibuprofen, klor-con m20, lamotrigine, levocetirizine, levothyroxine, levothyroxine, metformin, methocarbamol, montelukast, myrbetriq, omega-3 fish oil, promethazine, quetiapine, ranitidine, tramadol, trelegy ellipta, albuterol, glipizide, hydrocodone-acetaminophen, ondansetron, ondansetron, oxycodone-acetaminophen, oxycodone-acetaminophen, and tiotropium.  Current Outpatient Medications on File Prior to Visit  Medication Sig  . acetaminophen (TYLENOL) 325 MG tablet Take 1 tablet (325 mg total) by mouth every 6 (six) hours as needed for mild pain (or Fever >/= 101).  Marland Kitchen aspirin EC 81 MG tablet Take 81 mg by mouth daily at 12 noon.  Marland Kitchen atorvastatin (LIPITOR) 40 MG tablet Take 40 mg by mouth at bedtime.   Marland Kitchen azelastine (ASTELIN) 0.1 % nasal spray Place 1 spray into both nostrils daily.  . bumetanide  (BUMEX) 2 MG tablet Take 1 tablet (2 mg total) by mouth 2 (two) times daily.  . busPIRone (BUSPAR) 15 MG tablet Take 1 tablet (15 mg total) by mouth 3 (three) times daily.  . [facility] NICOTINE POLACRILEX 4 MG lozenge   . gabapentin (NEURONTIN) 600 MG tablet Take 1,200 mg by mouth 3 (three) times daily.   . hydrOXYzine (ATARAX/VISTARIL) 10 MG tablet Take 1-3 tablets (10-30 mg total) by mouth 2 (two) times daily as needed (for severe anxiety sx).  Marland Kitchen ibuprofen (ADVIL,MOTRIN) 800 MG tablet Take 800 mg by mouth every 8 (eight) hours as needed.  Marland Kitchen KLOR-CON M20 20 MEQ tablet Take 20 mEq by mouth daily.   Marland Kitchen lamoTRIgine (LAMICTAL) 25 MG tablet Take 1-2 tablets (25-50 mg total) by mouth daily. Take 25 mg for 2 weeks and increase to 50 mg  . levocetirizine (XYZAL) 5 MG tablet   . levothyroxine (SYNTHROID, LEVOTHROID) 200 MCG tablet Take 200 mcg by mouth daily.   Marland Kitchen levothyroxine (SYNTHROID, LEVOTHROID) 25 MCG tablet Take 25 mcg by mouth daily before breakfast.   . metFORMIN (GLUCOPHAGE) 500 MG tablet   . methocarbamol (ROBAXIN) 500 MG tablet 1-2 tablets every 6 hours prn muscle spasms.  . montelukast (SINGULAIR) 10 MG tablet Take 10 mg by mouth daily.   Marland Kitchen MYRBETRIQ 50 MG TB24 tablet   . omega-3 fish oil (MAXEPA) 1000 MG CAPS capsule   . promethazine (PHENERGAN) 25 MG tablet Take 1 tablet (25 mg total) by mouth every 6 (six) hours as needed for nausea or vomiting.  Marland Kitchen QUEtiapine (SEROQUEL) 25 MG tablet Take 3 tablets (75 mg total) by mouth at bedtime.  . ranitidine (ZANTAC) 300 MG tablet Take  1 tablet (300 mg total) by mouth 2 (two) times daily.  . traMADol (ULTRAM) 50 MG tablet   . TRELEGY ELLIPTA 100-62.5-25 MCG/INH AEPB   . albuterol (PROVENTIL HFA;VENTOLIN HFA) 108 (90 BASE) MCG/ACT inhaler Inhale 2 puffs into the lungs every 4 (four) hours as needed for wheezing or shortness of breath.  Marland Kitchen glipiZIDE (GLUCOTROL XL) 10 MG 24 hr tablet Take 10 mg by mouth.   Marland Kitchen HYDROcodone-acetaminophen (NORCO/VICODIN) 5-325 MG  tablet Take 1 tablet by mouth every 6 (six) hours as needed for moderate pain. (Patient not taking: Reported on 09/09/2017)  . ondansetron (ZOFRAN ODT) 4 MG disintegrating tablet Take 1 tablet (4 mg total) by mouth every 8 (eight) hours as needed for nausea or vomiting. (Patient not taking: Reported on 09/09/2017)  . ondansetron (ZOFRAN ODT) 4 MG disintegrating tablet Take 1 tablet (4 mg total) by mouth every 8 (eight) hours as needed for nausea or vomiting. (Patient not taking: Reported on 09/09/2017)  . oxyCODONE-acetaminophen (PERCOCET) 7.5-325 MG tablet Take 1 tablet by mouth every 4 (four) hours as needed for severe pain. (Patient not taking: Reported on 09/09/2017)  . oxyCODONE-acetaminophen (PERCOCET/ROXICET) 5-325 MG tablet Take 1 tablet by mouth every 4 (four) hours as needed for severe pain. (Patient not taking: Reported on 09/09/2017)  . tiotropium (SPIRIVA) 18 MCG inhalation capsule Place 18 mcg into inhaler and inhale at bedtime.   No current facility-administered medications on file prior to visit.    Imaging Review  Cervical Imaging:  Cervical DG 2-3 views:  Results for orders placed during the hospital encounter of 07/08/17  DG Cervical Spine 2-3 Views   Narrative CLINICAL DATA:  Left-sided neck pain.  EXAM: CERVICAL SPINE - 2-3 VIEW  COMPARISON:  Neck CT 06/25/2016  FINDINGS: There is no evidence of cervical spine fracture or prevertebral soft tissue swelling. Alignment is normal. Mild osteoarthritic changes at C3-C4, C5-C6 and C6-C7, with mild disc space narrowing and endplate remodeling.  IMPRESSION: No evidence of fracture or alignment abnormality.  Mild osteoarthritic changes at C3-C4, C5-C6 and C6-C7.   Electronically Signed   By: Fidela Salisbury M.D.   On: 07/08/2017 09:55   Shoulder Imaging:  Results for orders placed during the hospital encounter of 07/12/17  DG Shoulder Left   Narrative CLINICAL DATA:  Acute onset of diffuse left shoulder  pain.  EXAM: LEFT SHOULDER - 2+ VIEW  COMPARISON:  None.  FINDINGS: There is no evidence of fracture or dislocation. The left humeral head is seated within the glenoid fossa. The acromioclavicular joint is unremarkable in appearance. No significant soft tissue abnormalities are seen. The visualized portions of the left lung are clear.  IMPRESSION: No evidence of fracture or dislocation.   Electronically Signed   By: Garald Balding M.D.   On: 07/13/2017 00:05     Thoracic Imaging:  Results for orders placed during the hospital encounter of 08/02/15  DG Thoracic Spine 2 View   Narrative CLINICAL DATA:  Status post fall from a porch this morning. Thoracic spine pain. Initial encounter.  EXAM: THORACIC SPINE 2 VIEWS  COMPARISON:  CT chest 04/23/2015.  PA and lateral chest 07/01/2015.  FINDINGS: There is no evidence of thoracic spine fracture. Alignment is normal. No other significant bone abnormalities are identified.  IMPRESSION: Negative exam.   Electronically Signed   By: Inge Rise M.D.   On: 08/02/2015 14:01   Lumbosacral Imaging: Lumbar MR wo contrast:  Results for orders placed during the hospital encounter of 01/18/17  MR  LUMBAR SPINE WO CONTRAST   Narrative CLINICAL DATA:  Lumbar radiculopathy with left leg pain and weakness  EXAM: MRI LUMBAR SPINE WITHOUT CONTRAST  TECHNIQUE: Multiplanar, multisequence MR imaging of the lumbar spine was performed. No intravenous contrast was administered.  COMPARISON:  Lumbar radiographs 10/23/2013  FINDINGS: Segmentation:  S1 is a transitional vertebra.  Normal segmentation  Alignment:  Slight retrolisthesis L4-5 and L5-S1  Vertebrae: Negative for fracture or mass. Large hemangioma L2 vertebral body.  Conus medullaris and cauda equina: Conus extends to the L1-2 level. Conus and cauda equina appear normal.  Paraspinal and other soft tissues: Negative  Disc levels:  T12-L1:  Mild disc  degeneration  L1-2:  Mild disc degeneration  L2-3:  Negative  L3-4:  Mild disc degeneration without stenosis  L4-5: Mild disc degeneration and disc bulging. Mild facet hypertrophy. No significant stenosis.  L5-S1: Small central disc protrusion. Bilateral facet hypertrophy with mild subarticular stenosis bilaterally.  IMPRESSION: Multilevel mild lumbar degenerative changes as above. Small central disc protrusion L5-S1 with mild subarticular stenosis bilaterally L5-S1.   Electronically Signed   By: Franchot Gallo M.D.   On: 01/18/2017 14:51    Results for orders placed during the hospital encounter of 03/03/15  CT Lumbar Spine Wo Contrast   Narrative CLINICAL DATA:  Chronic low back pain.  EXAM: CT LUMBAR SPINE WITHOUT CONTRAST  TECHNIQUE: Multidetector CT imaging of the lumbar spine was performed without intravenous contrast administration. Multiplanar CT image reconstructions were also generated.  COMPARISON:  Lumbar spine x-ray October 23, 2013  FINDINGS: No malalignment is identified. There is no significant canal narrowing. No fractures are seen. Small posterior disc bulges are seen at L4-5 and L5-S1. Degenerative changes are seen in the L4-5 facets. No other significant degenerative changes are identified.  There is mild narrowing of the bilateral L5-S1 neural foramina.  IMPRESSION: 1. Lower lumbar facet degenerative changes primarily at L4. Disc bulges at L4-5 and L5-S1. Mild narrowing of the bilateral L5-S1 neural foramina.   Electronically Signed   By: Dorise Bullion III M.D   On: 03/03/2015 11:22      Results for orders placed during the hospital encounter of 08/02/15  DG Lumbar Spine 2-3 Views   Narrative CLINICAL DATA:  Status post fall off a porch this morning. The patient landed on her back. Pain. Initial encounter.  EXAM: LUMBAR SPINE - 2-3 VIEW  COMPARISON:  CT lumbar spine 03/03/2015.  FINDINGS: There is no evidence of lumbar  spine fracture. Alignment is normal. Intervertebral disc spaces are maintained. Surgical clips in the pelvis are incidentally noted  IMPRESSION: Negative exam.   Electronically Signed   By: Inge Rise M.D.   On: 08/02/2015 13:59   Knee Imaging:  Knee-L MR w contrast:  Results for orders placed during the hospital encounter of 09/23/15  MR Knee Left  Wo Contrast   Narrative CLINICAL DATA:  Left knee pain since a fall in June, 2017 when the patient's ankle gave way. Swelling. Subsequent encounter.  EXAM: MRI OF THE LEFT KNEE WITHOUT CONTRAST  TECHNIQUE: Multiplanar, multisequence MR imaging of the knee was performed. No intravenous contrast was administered.  COMPARISON:  Plain films left knee 07/19/2015.  FINDINGS: MENISCI  Medial meniscus:  Intact.  Lateral meniscus:  Intact.  Complete discoid configuration.  LIGAMENTS  Cruciates:  Intact.  Collaterals:  Intact.  CARTILAGE  Patellofemoral:  Mildly degenerated.  Medial:  Unremarkable.  Lateral:  Unremarkable.  Joint:  Trace amount of joint fluid.  Popliteal Fossa:  No Baker's cyst.  Extensor Mechanism:  Intact.  Bones:  No fracture or worrisome marrow lesion  Other: Fluid is seen anterior to the patella.  IMPRESSION: Negative for meniscal or ligament tear. Complete discoid lateral meniscus is noted.  Prepatellar fluid consistent with bursitis.  Mild patellofemoral degenerative change.   Electronically Signed   By: Inge Rise M.D.   On: 09/23/2015 09:17    Results for orders placed during the hospital encounter of 11/26/16  DG Knee Complete 4 Views Left   Narrative CLINICAL DATA:  Left knee pain after a fall 2 weeks ago. No improvement with over the counter medications.  EXAM: LEFT KNEE - COMPLETE 4+ VIEW  COMPARISON:  Left knee 07/19/2015.  MRI left knee 09/23/2015.  FINDINGS: No evidence of fracture, dislocation, or joint effusion. No evidence of arthropathy or other  focal bone abnormality. Soft tissues are unremarkable.  IMPRESSION: Negative.   Electronically Signed   By: Lucienne Capers M.D.   On: 11/26/2016 22:30    Note: Available results from prior imaging studies were reviewed.        ROS  Cardiovascular History: Daily Aspirin intake and Heart failure Pulmonary or Respiratory History: Wheezing and difficulty taking a deep full breath (Asthma), Shortness of breath, Smoking, Coughing up mucus (Bronchitis) and Temporary stoppage of breathing during sleep Neurological History: Stroke (Residual deficits or weakness: unknown) Review of Past Neurological Studies:  Results for orders placed or performed during the hospital encounter of 09/20/16  MR Brain Wo Contrast   Narrative   CLINICAL DATA:  Initial evaluation for acute right-sided facial droop.  EXAM: MRI HEAD WITHOUT CONTRAST  TECHNIQUE: Multiplanar, multiecho pulse sequences of the brain and surrounding structures were obtained without intravenous contrast.  COMPARISON:  Prior CT from earlier the same day.  FINDINGS: Brain: Study degraded by motion artifact.  Cerebral volume within normal limits for age. No significant cerebral white matter disease for age. No abnormal foci of restricted diffusion to suggest acute or subacute ischemia. Gray-white matter differentiation maintained. No encephalomalacia to suggest chronic infarction. No evidence for acute or chronic intracranial hemorrhage.  No mass lesion, midline shift or mass effect. Ventricles normal in size without evidence for hydrocephalus. No extra-axial fluid collection. Major dural sinuses are grossly patent.  Pituitary gland suprasellar region within normal limits.  Vascular: Major intracranial vascular flow voids are maintained.  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 Craniocervical junction otherwise normal. Visualized upper  cervical spine within normal limits. Bone marrow signal intensity diffusely decreased on T1 weighted imaging, most commonly related to anemia, smoking, or obesity. No discrete osseous lesion. No scalp soft tissue abnormality.  Sinuses/Orbits: Globes and orbital soft tissues within normal limits. Paranasal sinuses are clear. No mastoid effusion. Inner ear structures normal.  IMPRESSION: Normal MRI of the brain.  No acute intracranial process identified.   Electronically Signed   By: Jeannine Boga M.D.   On: 09/20/2016 17:15   Results for orders placed or performed during the hospital encounter of 02/24/15  CT Head Wo Contrast   Narrative   CLINICAL DATA:  Headache following seizure.  Facial droop  EXAM: CT HEAD WITHOUT CONTRAST  TECHNIQUE: Contiguous axial images were obtained from the base of the skull through the vertex without intravenous contrast.  COMPARISON:  None.  FINDINGS: The ventricles are normal in size and configuration. There is no intracranial mass, hemorrhage, extra-axial fluid collection, or midline shift. The gray-white compartments appear normal.  No acute infarct is evident. The bony calvarium appears intact. The visualized mastoid air cells are clear. No intraorbital lesions are identified visualized orbital regions.  IMPRESSION: No intracranial mass, hemorrhage, or focal gray -white compartment lesions/acute appearing infarct.   Electronically Signed   By: Lowella Grip III M.D.   On: 02/24/2015 14:33    Psychological-Psychiatric History: Depressed Gastrointestinal History: Reflux or heatburn and Alternating episodes iof diarrhea and constipation (IBS-Irritable bowe syndrome) Genitourinary History: No reported renal or genitourinary signs or symptoms such as difficulty voiding or producing urine, peeing blood, non-functioning kidney, kidney stones, difficulty emptying the bladder, difficulty controlling the flow of urine, or chronic  kidney disease Hematological History: No reported hematological signs or symptoms such as prolonged bleeding, low or poor functioning platelets, bruising or bleeding easily, hereditary bleeding problems, low energy levels due to low hemoglobin or being anemic Endocrine History: High blood sugar controlled without the use of insulin (NIDDM) and Slow thyroid Rheumatologic History: Joint aches and or swelling due to excess weight (Osteoarthritis) and Constant unexplained fatigue (Chronic Fatigue Syndrome) Musculoskeletal History: Negative for myasthenia gravis, muscular dystrophy, multiple sclerosis or malignant hyperthermia Work History: Disabled  Allergies  Marie Clarke is allergic to diazepam; levofloxacin; metronidazole; pregabalin; sulfa antibiotics; cephalexin; ciprofloxacin; vancomycin; ziprasidone hcl; and penicillins.  Laboratory Chemistry  Inflammation Markers No results found for: CRP, ESRSEDRATE (CRP: Acute Phase) (ESR: Chronic Phase) Renal Function Markers Lab Results  Component Value Date   BUN 20 08/10/2017   CREATININE 0.78 08/10/2017   GFRAA >60 08/10/2017   GFRNONAA >60 08/10/2017   Hepatic Function Markers Lab Results  Component Value Date   AST 17 08/10/2017   ALT 14 08/10/2017   ALBUMIN 4.1 08/10/2017   ALKPHOS 104 08/10/2017   Electrolytes Lab Results  Component Value Date   NA 141 08/10/2017   K 3.9 08/10/2017   CL 106 08/10/2017   CALCIUM 9.4 08/10/2017   MG 1.9 09/17/2016   Neuropathy Markers No results found for: [id number] Bone Pathology Markers Lab Results  Component Value Date   ALKPHOS 104 08/10/2017   CALCIUM 9.4 08/10/2017   Coagulation Parameters Lab Results  Component Value Date   INR 0.92 09/20/2016   LABPROT 12.3 09/20/2016   APTT <24 (L) 09/20/2016   PLT 330 08/10/2017   Cardiovascular Markers Lab Results  Component Value Date   BNP 24.0 07/04/2016   HGB 11.7 (L) 08/10/2017   HCT 34.5 (L) 08/10/2017   Note: Lab results  reviewed.  PFSH  Drug: Marie Clarke  reports that she does not use drugs. Alcohol:  reports that she does not drink alcohol. Tobacco:  reports that she has been smoking cigarettes.  She has been smoking about 1.00 pack per day. She has never used smokeless tobacco. Medical:  has a past medical history of Anemia, Anxiety, Asthma, Bipolar disorder ([place]), CAD (coronary artery disease) ([place]), CHF (congestive heart failure) ([place]), COPD (chronic obstructive pulmonary disease) ([place]), Depression ([place]), Diabetes mellitus without complication ([place]), Diabetes mellitus, type II ([place]), Drug overdose, GERD (gastroesophageal reflux disease), Headache, Hyperlipidemia, Hypertension, Left leg pain (04/29/2014), MI (myocardial infarction) ([place]), Muscle ache (09/16/2014), Osteoporosis, Overactive bladder, Pancreatitis (unk), Reflex sympathetic dystrophy, Renal insufficiency, Sleep apnea, Sleep apnea, Stroke ([place]), Thyroid disease, TIA (transient ischemic attack) ([place]), and TIA (transient ischemic attack). Family: family history includes Anxiety disorder in her father and mother; Bipolar disorder in her father and mother; CAD in her mother; Depression in her father and mother; Diabetes Mellitus II in her mother; Hypertension in her  father; Osteoarthritis in her mother; Osteoporosis in her mother; Post-traumatic stress disorder in her sister; Sleep apnea in her mother.  Past Surgical History:  Procedure Laterality Date  . ABDOMINAL HYSTERECTOMY    . HERNIA REPAIR  07/15/2017  . prolapse rectum surgery N/A July 2016  . TONSILLECTOMY     Active Ambulatory Problems    Diagnosis Date Noted  . Affective bipolar disorder ([place]) 08/05/2014  . Arteriosclerosis of coronary artery 08/05/2014  . CCF (congestive cardiac failure) ([place]) 08/05/2014  . Chronic kidney disease, stage III (moderate) ([place]) 12/14/2013  . Chronic kidney disease 08/05/2014  . Controlled diabetes mellitus type II without complication ([place]) 76/73/4193  .  Essential (primary) hypertension 12/03/2013  . Polypharmacy 04/29/2014  . Adult hypothyroidism 12/03/2013  . 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 Apnea, sleep 08/05/2014  . Temporary cerebral vascular dysfunction 08/05/2014  . Rectal prolapse 08/09/2014  . Rectal bleeding 08/09/2014  . Rectal bleed 08/09/2014  . 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 Diabetes mellitus, type 2 ([place]) 08/13/2014  . Abdominal wall abscess 09/20/2014  . Bipolar affective disorder ([place]) 08/13/2014  . Congestive heart failure ([place]) 11/15/2014  . Controlled type 2 diabetes mellitus without complication ([place]) 79/03/4095  . Other long term (current) drug therapy 04/29/2014  . Type 2 diabetes mellitus ([place]) 08/13/2014  . Abnormal gait 12/15/2014  . 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 Chronic pain 03/28/2015  . Chronic low back pain (Location of Primary Source of Pain) (Bilateral) (L>R) 03/28/2015  . Chronic lower extremity pain (Left) 03/28/2015  . Abdominal wound dehiscence 03/28/2015  . Encounter for pain management planning 03/28/2015  . Morbid obesity ([place]) 03/28/2015  . Abnormal CT scan, lumbar spine 03/28/2015  . Lumbar facet hypertrophy 03/28/2015  . Lumbar facet syndrome (Location of Primary Source of Pain) (Bilateral) (L>R) 03/28/2015  . Lumbar foraminal stenosis (Bilateral) (L5-S1) 03/28/2015  . Chronic ankle pain (Left) 03/28/2015  . CRPS (complex regional pain syndrome) type I of lower limb (left ankle) 03/28/2015  . Neurogenic pain 03/28/2015  . Neuropathic pain 03/28/2015  . Myofascial pain 03/28/2015  . History of suicide attempt 03/28/2015  . Ankle instability 07/20/2015  . Incidental lung nodule 04/28/2015  . Contusion of knee, left 10/12/2015  . Sprain of left ankle 10/12/2015  . Strain of left knee 10/12/2015  . Hypotension 09/17/2016  . Sepsis ([place]) 04/14/2017  . Osteopenia 04/03/2017  . Strain of extensor muscle, fascia and tendon of left  index finger at wrist and hand level, initial encounter 05/16/2017  . Tobacco use disorder 07/16/2017  . Ventral hernia without obstruction or gangrene 06/25/2017  . Chronic neck pain (Fourth Area of Pain) (L>R) 09/09/2017  . Chronic left shoulder pain 09/09/2017  . Pharmacologic therapy 09/09/2017  . Disorder of skeletal system 09/09/2017  . Problems influencing health status 09/09/2017  . Long term current use of opiate analgesic 09/09/2017   Resolved Ambulatory Problems    Diagnosis Date Noted  . No Resolved Ambulatory Problems   Past Medical History:  Diagnosis Date  . Anemia   . Anxiety   . Asthma   . Bipolar disorder ([place])   . CAD (coronary artery disease) unk  . CHF (congestive heart failure) ([place])   . COPD (chronic obstructive pulmonary disease) ([place])   . Depression unk  . Diabetes mellitus without complication ([place])   . Diabetes mellitus, type II ([place])   . Drug overdose   . GERD (gastroesophageal reflux disease)   . Headache   .  Hyperlipidemia   . Hypertension   . Left leg pain 04/29/2014  . MI (myocardial infarction) ([place])   . Muscle ache 09/16/2014  . Osteoporosis   . Overactive bladder   . Pancreatitis unk  . Reflex sympathetic dystrophy   . Renal insufficiency   . Sleep apnea   . Sleep apnea   . Stroke ([place])   . Thyroid disease   . TIA (transient ischemic attack) unk  . TIA (transient ischemic attack)    Constitutional Exam  General appearance: Well nourished, well developed, and well hydrated. In no apparent acute distress Vitals:   09/09/17 0804  BP: (!) 112/95  Pulse: (!) 102  Resp: 18  Temp: 97.7 F (36.5 C)  SpO2: 97%  Weight: 235 lb (106.6 kg)  Height: 5' 4" (1.626 m)   BMI Assessment: Estimated body mass index is 40.34 kg/m as calculated from the following:   Height as of this encounter: 5' 4" (1.626 m).   Weight as of this encounter: 235 lb (106.6 kg).  BMI interpretation table: BMI level Category Range association with higher  incidence of chronic pain  <18 kg/m2 Underweight   18.5-24.9 kg/m2 Ideal body weight   25-29.9 kg/m2 Overweight Increased incidence by 20%  30-34.9 kg/m2 Obese (Class I) Increased incidence by 68%  35-39.9 kg/m2 Severe obesity (Class II) Increased incidence by 136%  >40 kg/m2 Extreme obesity (Class III) Increased incidence by 254%   BMI Readings from Last 4 Encounters:  09/09/17 40.34 kg/m  08/10/17 38.62 kg/m  08/01/17 39.48 kg/m  07/08/17 36.05 kg/m   Wt Readings from Last 4 Encounters:  09/09/17 235 lb (106.6 kg)  08/10/17 225 lb (102.1 kg)  08/01/17 230 lb (104.3 kg)  07/08/17 210 lb (95.3 kg)  Psych/Mental status: Alert, oriented x 3 (person, place, & time)       Eyes: PERLA Respiratory: No evidence of acute respiratory distress  Cervical Spine Exam  Inspection: No masses, redness, or swelling Alignment: Symmetrical Functional ROM: Pain restricted ROM      with left sided rotation Stability: No instability detected Muscle strength & Tone: Functionally intact Sensory: Unimpaired Palpation: Complains of area being tender to palpation              Upper Extremity (UE) Exam    Side: Right upper extremity  Side: Left upper extremity  Inspection: No masses, redness, swelling, or asymmetry. No contractures  Inspection: No masses, redness, swelling, or asymmetry. No contractures  Functional ROM: Unrestricted ROM          Functional ROM: Pain restricted ROM for shoulder  Muscle strength & Tone: Functionally intact  Muscle strength & Tone: Movement possible against some resistance (4/5)  Sensory: Unimpaired  Sensory: Unimpaired  Palpation: No palpable anomalies              Palpation: Complains of area being tender to palpation              Specialized Test(s): Deferred         Specialized Test(s): Deferred          Thoracic Spine Exam  Inspection: No masses, redness, or swelling Alignment: Symmetrical Functional ROM: Unrestricted ROM Stability: No instability  detected Sensory: Unimpaired Muscle strength & Tone: No palpable anomalies  Lumbar Spine Exam  Inspection: No masses, redness, or swelling Alignment: Symmetrical Functional ROM: Pain restricted ROM      Stability: No instability detected Muscle strength & Tone: Functionally intact Sensory: Unimpaired Palpation: Complains of area being tender  to palpation       Provocative Tests: Lumbar Hyperextension and rotation test: Positive on the left for facet joint pain. Patrick's Maneuver: Unable to perform                    Gait & Posture Assessment  Ambulation: Unassisted Gait: Awkward Posture: WNL   Lower Extremity Exam    Side: Right lower extremity  Side: Left lower extremity  Inspection: Edema  Inspection: Edema slight discoloration to posterior lower calf  Functional ROM: Unrestricted ROM          Functional ROM: Pain restricted ROM          Muscle strength & Tone: Functionally intact  Muscle strength & Tone: Movement possible against some resistance (4/5)  Sensory: Unimpaired  Sensory: Unimpaired  Palpation: No palpable anomalies  Palpation: Tender   Assessment  Primary Diagnosis & Pertinent Problem List: The primary encounter diagnosis was Chronic bilateral low back pain with left-sided sciatica. Diagnoses of Chronic lower extremity pain (Left), Chronic ankle pain (Left), Chronic neck pain (Fourth Area of Pain) (L>R), Chronic left shoulder pain, Chronic pain syndrome, Pharmacologic therapy, Disorder of skeletal system, Problems influencing health status, and Long term current use of opiate analgesic were also pertinent to this visit.  Visit Diagnosis: 1. Chronic bilateral low back pain with left-sided sciatica   2. Chronic lower extremity pain (Left)   3. Chronic ankle pain (Left)   4. Chronic neck pain (Fourth Area of Pain) (L>R)   5. Chronic left shoulder pain   6. Chronic pain syndrome   7. Pharmacologic therapy   8. Disorder of skeletal system   9. Problems influencing  health status   10. Long term current use of opiate analgesic    Plan of Care  Initial treatment plan:  Please be advised that as per protocol, today's visit has been an evaluation only. We have not taken over the patient's controlled substance management.  Problem-specific plan: No problem-specific Assessment & Plan notes found for this encounter.  Ordered Lab-work, Procedure(s), Referral(s), & Consult(s): Orders Placed This Encounter  Procedures  . Compliance Drug Analysis, Ur  . Comp. Metabolic Panel (12)  . Magnesium  . Vitamin B12  . Sedimentation rate  . 25-Hydroxyvitamin D Lcms D2+D3  . C-reactive protein   Pharmacotherapy: Medications ordered:  No orders of the defined types were placed in this encounter.  Medications administered during this visit: Marie Clarke had no medications administered during this visit.   Pharmacotherapy under consideration:  Opioid Analgesics: The patient was informed that there is no guarantee that she would be a candidate for opioid analgesics. The decision will be made following CDC guidelines. This decision will be based on the results of diagnostic studies, as well as Marie Clarke's risk profile.  Membrane stabilizer: To be determined at a later time Muscle relaxant: To be determined at a later time NSAID: To be determined at a later time Other analgesic(s): To be determined at a later time   Interventional therapies under consideration: Marie Clarke was informed that there is no guarantee that she would be a candidate for interventional therapies. The decision will be based on the results of diagnostic studies, as well as Marie Clarke's risk profile.  Possible procedure(s): Diagnostic left-sided lumbar epidural steroid injection Diagnostic bilateral lumbar facet nerve block Possible bilateral lumbar facet radiofrequency ablation Diagnostic left-sided sympathetic nerve block Diagnostic bilateral cervical facet nerve block Possible bilateral cervical  facet radiofrequency ablation Diagnostic left suprascapular nerve block  Diagnostic left intra-articular shoulder injection Diagnostic left suprascapular radiofrequency ablation   Provider-requested follow-up: Return for 2nd Visit, w/ Dr. Dossie Arbour.  Future Appointments  Date Time Provider [place]  10/02/2017  2:00 PM Milinda Pointer, MD [facility] None  10/03/2017  1:30 PM Ursula Alert, MD [facility] None    Primary Care Physician: Sharyne Peach, MD [facility] Note by:  Date: 09/09/2017; Time: 10:59 AM  Pain Score Disclaimer: We use the NRS-11 scale. This is a self-reported, subjective measurement of pain severity with only modest accuracy. It is used primarily to identify changes within a particular patient. It must be understood that outpatient pain scales are significantly less accurate that those used for research, where they can be applied under ideal controlled circumstances with minimal exposure to variables. In reality, the score is likely to be a combination of pain intensity and pain affect, where pain affect describes the degree of emotional arousal or changes in action readiness caused by the sensory experience of pain. Factors such as social and work situation, setting, emotional state, anxiety levels, expectation, and prior pain experience may influence pain perception and show large inter-individual differences that may also be affected by time variables.  Patient instructions provided during this appointment: Patient Instructions   You have been instructed to get labwork today and return for 2nd visit with Dr Dossie Arbour.  ____________________________________________________________________________________________  Appointment Policy Summary  It is our goal and responsibility to provide the medical community with assistance in the evaluation and management of patients with chronic pain. Unfortunately our resources are  limited. Because we do not have an unlimited amount of time, or available appointments, we are required to closely monitor and manage their use. The following rules exist to maximize their use:  Patient's responsibilities: 1. Punctuality:  At what time should I arrive? You should be physically present in our office 30 minutes before your scheduled appointment. Your scheduled appointment is with your assigned healthcare provider. However, it takes 5-10 minutes to be "checked-in", and another 15 minutes for the nurses to do the admission. If you arrive to our office at the time you were given for your appointment, you will end up being at least 20-25 minutes late to your appointment with the provider. 2. Tardiness:  What happens if I arrive only a Clarke minutes after my scheduled appointment time? You will need to reschedule your appointment. The cutoff is your appointment time. This is why it is so important that you arrive at least 30 minutes before that appointment. If you have an appointment scheduled for 10:00 AM and you arrive at 10:01, you will be required to reschedule your appointment.  3. Plan ahead:  Always assume that you will encounter traffic on your way in. Plan for it. If you are dependent on a driver, make sure they understand these rules and the need to arrive early. 4. Other appointments and responsibilities:  Avoid scheduling any other appointments before or after your pain clinic appointments.  5. Be prepared:  Write down everything that you need to discuss with your healthcare provider and give this information to the admitting nurse. Write down the medications that you will need refilled. Bring your pills and bottles (even the empty ones), to all of your appointments, except for those where a procedure is scheduled. 6. No children or pets:  Find someone to take care of them. It is not appropriate to bring them in. 7. Scheduling changes:  We request "advanced notification" of any  changes or cancellations. 8. Advanced notification:  Defined as a time period of more than 24 hours prior to the originally scheduled appointment. This allows for the appointment to be offered to other patients. 9. Rescheduling:  When a visit is rescheduled, it will require the cancellation of the original appointment. For this reason they both fall within the category of "Cancellations".  10. Cancellations:  They require advanced notification. Any cancellation less than 24 hours before the  appointment will be recorded as a "No Show". 11. No Show:  Defined as an unkept appointment where the patient failed to notify or declare to the practice their intention or inability to keep the appointment.  Corrective process for repeat offenders:  1. Tardiness: Three (3) episodes of rescheduling due to late arrivals will be recorded as one (1) "No Show". 2. Cancellation or reschedule: Three (3) cancellations or rescheduling will be recorded as one (1) "No Show". 3. "No Shows": Three (3) "No Shows" within a 12 month period will result in discharge from the practice. ____________________________________________________________________________________________  ____________________________________________________________________________________________  Pain Scale  Introduction: The pain score used by this practice is the Verbal Numerical Rating Scale (VNRS-11). This is an 11-point scale. It is for adults and children 10 years or older. There are significant differences in how the pain score is reported, used, and applied. Forget everything you learned in the past and learn this scoring system.  General Information: The scale should reflect your current level of pain. Unless you are specifically asked for the level of your worst pain, or your average pain. If you are asked for one of these two, then it should be understood that it is over the past 24 hours.  Basic Activities of Daily Living (ADL): Personal  hygiene, dressing, eating, transferring, and using restroom.  Instructions: Most patients tend to report their level of pain as a combination of two factors, their physical pain and their psychosocial pain. This last one is also known as "suffering" and it is reflection of how physical pain affects you socially and psychologically. From now on, report them separately. From this point on, when asked to report your pain level, report only your physical pain. Use the following table for reference.  Pain Clinic Pain Levels (0-5/10)  Pain Level Score  Description  No Pain 0   Mild pain 1 Nagging, annoying, but does not interfere with basic activities of daily living (ADL). Patients are able to eat, bathe, get dressed, toileting (being able to get on and off the toilet and perform personal hygiene functions), transfer (move in and out of bed or a chair without assistance), and maintain continence (able to control bladder and bowel functions). Blood pressure and heart rate are unaffected. A normal heart rate for a healthy adult ranges from 60 to 100 bpm (beats per minute).   Mild to moderate pain 2 Noticeable and distracting. Impossible to hide from other people. More frequent flare-ups. Still possible to adapt and function close to normal. It can be very annoying and may have occasional stronger flare-ups. With discipline, patients may get used to it and adapt.   Moderate pain 3 Interferes significantly with activities of daily living (ADL). It becomes difficult to feed, bathe, get dressed, get on and off the toilet or to perform personal hygiene functions. Difficult to get in and out of bed or a chair without assistance. Very distracting. With effort, it can be ignored when deeply involved in activities.   Moderately severe pain 4 Impossible to ignore for  more than a Clarke minutes. With effort, patients may still be able to manage work or participate in some social activities. Very difficult to  concentrate. Signs of autonomic nervous system discharge are evident: dilated pupils (mydriasis); mild sweating (diaphoresis); sleep interference. Heart rate becomes elevated (>115 bpm). Diastolic blood pressure (lower number) rises above 100 mmHg. Patients find relief in laying down and not moving.   Severe pain 5 Intense and extremely unpleasant. Associated with frowning face and frequent crying. Pain overwhelms the senses.  Ability to do any activity or maintain social relationships becomes significantly limited. Conversation becomes difficult. Pacing back and forth is common, as getting into a comfortable position is nearly impossible. Pain wakes you up from deep sleep. Physical signs will be obvious: pupillary dilation; increased sweating; goosebumps; brisk reflexes; cold, clammy hands and feet; nausea, vomiting or dry heaves; loss of appetite; significant sleep disturbance with inability to fall asleep or to remain asleep. When persistent, significant weight loss is observed due to the complete loss of appetite and sleep deprivation.  Blood pressure and heart rate becomes significantly elevated. Caution: If elevated blood pressure triggers a pounding headache associated with blurred vision, then the patient should immediately seek attention at an urgent or emergency care unit, as these may be signs of an impending stroke.    Emergency Department Pain Levels (6-10/10)  Emergency Room Pain 6 Severely limiting. Requires emergency care and should not be seen or managed at an outpatient pain management facility. Communication becomes difficult and requires great effort. Assistance to reach the emergency department may be required. Facial flushing and profuse sweating along with potentially dangerous increases in heart rate and blood pressure will be evident.   Distressing pain 7 Self-care is very difficult. Assistance is required to transport, or use restroom. Assistance to reach the emergency department  will be required. Tasks requiring coordination, such as bathing and getting dressed become very difficult.   Disabling pain 8 Self-care is no longer possible. At this level, pain is disabling. The individual is unable to do even the most "basic" activities such as walking, eating, bathing, dressing, transferring to a bed, or toileting. Fine motor skills are lost. It is difficult to think clearly.   Incapacitating pain 9 Pain becomes incapacitating. Thought processing is no longer possible. Difficult to remember your own name. Control of movement and coordination are lost.   The worst pain imaginable 10 At this level, most patients pass out from pain. When this level is reached, collapse of the autonomic nervous system occurs, leading to a sudden drop in blood pressure and heart rate. This in turn results in a temporary and dramatic drop in blood flow to the brain, leading to a loss of consciousness. Fainting is one of the body's self defense mechanisms. Passing out puts the brain in a calmed state and causes it to shut down for a while, in order to begin the healing process.    Summary: 1. Refer to this scale when providing [place] with your pain level. 2. Be accurate and careful when reporting your pain level. This will help with your care. 3. Over-reporting your pain level will lead to loss of credibility. 4. Even a level of 1/10 means that there is pain and will be treated at our facility. 5. High, inaccurate reporting will be documented as "Symptom Exaggeration", leading to loss of credibility and suspicions of possible secondary gains such as obtaining more narcotics, or wanting to appear disabled, for fraudulent reasons. 6. Only pain levels of  5 or below will be seen at our facility. 7. Pain levels of 6 and above will be sent to the Emergency Department and the appointment cancelled. ____________________________________________________________________________________________    BMI Assessment:  Estimated body mass index is 40.34 kg/m as calculated from the following:   Height as of this encounter: 5' 4" (1.626 m).   Weight as of this encounter: 235 lb (106.6 kg).  BMI interpretation table: BMI level Category Range association with higher incidence of chronic pain  <18 kg/m2 Underweight   18.5-24.9 kg/m2 Ideal body weight   25-29.9 kg/m2 Overweight Increased incidence by 20%  30-34.9 kg/m2 Obese (Class I) Increased incidence by 68%  35-39.9 kg/m2 Severe obesity (Class II) Increased incidence by 136%  >40 kg/m2 Extreme obesity (Class III) Increased incidence by 254%   BMI Readings from Last 4 Encounters:  09/09/17 40.34 kg/m  08/10/17 38.62 kg/m  08/01/17 39.48 kg/m  07/08/17 36.05 kg/m   Wt Readings from Last 4 Encounters:  09/09/17 235 lb (106.6 kg)  08/10/17 225 lb (102.1 kg)  08/01/17 230 lb (104.3 kg)  07/08/17 210 lb (95.3 kg)

## 2017-09-09 NOTE — Progress Notes (Signed)
Safety precautions to be maintained throughout the outpatient stay will include: orient to surroundings, keep bed in low position, maintain call bell within reach at all times, provide assistance with transfer out of bed and ambulation.  

## 2017-09-10 ENCOUNTER — Other Ambulatory Visit: Payer: Self-pay | Admitting: Nurse Practitioner

## 2017-09-10 ENCOUNTER — Encounter: Payer: Self-pay | Admitting: Nurse Practitioner

## 2017-09-10 DIAGNOSIS — I2510 Atherosclerotic heart disease of native coronary artery without angina pectoris: Secondary | ICD-10-CM | POA: Diagnosis not present

## 2017-09-10 DIAGNOSIS — R7982 Elevated C-reactive protein (CRP): Secondary | ICD-10-CM

## 2017-09-10 DIAGNOSIS — R700 Elevated erythrocyte sedimentation rate: Secondary | ICD-10-CM | POA: Insufficient documentation

## 2017-09-11 DIAGNOSIS — I2510 Atherosclerotic heart disease of native coronary artery without angina pectoris: Secondary | ICD-10-CM | POA: Diagnosis not present

## 2017-09-12 DIAGNOSIS — G4733 Obstructive sleep apnea (adult) (pediatric): Secondary | ICD-10-CM | POA: Diagnosis not present

## 2017-09-12 DIAGNOSIS — I2510 Atherosclerotic heart disease of native coronary artery without angina pectoris: Secondary | ICD-10-CM | POA: Diagnosis not present

## 2017-09-12 LAB — COMPLIANCE DRUG ANALYSIS, UR

## 2017-09-13 DIAGNOSIS — I2510 Atherosclerotic heart disease of native coronary artery without angina pectoris: Secondary | ICD-10-CM | POA: Diagnosis not present

## 2017-09-13 LAB — COMP. METABOLIC PANEL (12)
ALK PHOS: 126 IU/L — AB (ref 39–117)
AST: 16 IU/L (ref 0–40)
Albumin/Globulin Ratio: 1.7 (ref 1.2–2.2)
Albumin: 4.6 g/dL (ref 3.5–5.5)
BUN/Creatinine Ratio: 16 (ref 9–23)
BUN: 13 mg/dL (ref 6–24)
Bilirubin Total: 0.2 mg/dL (ref 0.0–1.2)
CALCIUM: 9.5 mg/dL (ref 8.7–10.2)
CREATININE: 0.83 mg/dL (ref 0.57–1.00)
Chloride: 104 mmol/L (ref 96–106)
GFR, EST AFRICAN AMERICAN: 98 mL/min/{1.73_m2} (ref >59)
GFR, EST NON AFRICAN AMERICAN: 85 mL/min/{1.73_m2} (ref >59)
GLUCOSE: 105 mg/dL — AB (ref 65–99)
Globulin, Total: 2.7 g/dL (ref 1.5–4.5)
Potassium: 4.9 mmol/L (ref 3.5–5.2)
Sodium: 142 mmol/L (ref 134–144)
Total Protein: 7.3 g/dL (ref 6.0–8.5)

## 2017-09-13 LAB — 25-HYDROXYVITAMIN D LCMS D2+D3: 25-HYDROXY, VITAMIN D: 43 ng/mL

## 2017-09-13 LAB — RHEUMATOID FACTOR: Rhuematoid fact SerPl-aCnc: <10 IU/mL (ref 0.0–13.9)

## 2017-09-13 LAB — SPECIMEN STATUS REPORT

## 2017-09-13 LAB — ANA W/REFLEX IF POSITIVE: ANA: NEGATIVE

## 2017-09-13 LAB — C-REACTIVE PROTEIN: CRP: 21 mg/L — AB (ref 0–10)

## 2017-09-13 LAB — VITAMIN B12: VITAMIN B 12: 762 pg/mL (ref 232–1245)

## 2017-09-13 LAB — SEDIMENTATION RATE: SED RATE: 50 mm/h — AB (ref 0–32)

## 2017-09-13 LAB — MAGNESIUM: Magnesium: 1.9 mg/dL (ref 1.6–2.3)

## 2017-09-13 LAB — 25-HYDROXY VITAMIN D LCMS D2+D3: 25-Hydroxy, Vitamin D-3: 43 ng/mL

## 2017-09-14 DIAGNOSIS — I2510 Atherosclerotic heart disease of native coronary artery without angina pectoris: Secondary | ICD-10-CM | POA: Diagnosis not present

## 2017-09-15 DIAGNOSIS — I2510 Atherosclerotic heart disease of native coronary artery without angina pectoris: Secondary | ICD-10-CM | POA: Diagnosis not present

## 2017-09-16 DIAGNOSIS — I2510 Atherosclerotic heart disease of native coronary artery without angina pectoris: Secondary | ICD-10-CM | POA: Diagnosis not present

## 2017-09-17 ENCOUNTER — Encounter: Payer: Self-pay | Admitting: Pain Medicine

## 2017-09-17 DIAGNOSIS — I2510 Atherosclerotic heart disease of native coronary artery without angina pectoris: Secondary | ICD-10-CM | POA: Diagnosis not present

## 2017-09-18 DIAGNOSIS — I2510 Atherosclerotic heart disease of native coronary artery without angina pectoris: Secondary | ICD-10-CM | POA: Diagnosis not present

## 2017-09-19 DIAGNOSIS — I2510 Atherosclerotic heart disease of native coronary artery without angina pectoris: Secondary | ICD-10-CM | POA: Diagnosis not present

## 2017-09-20 DIAGNOSIS — I2510 Atherosclerotic heart disease of native coronary artery without angina pectoris: Secondary | ICD-10-CM | POA: Diagnosis not present

## 2017-09-21 DIAGNOSIS — I2510 Atherosclerotic heart disease of native coronary artery without angina pectoris: Secondary | ICD-10-CM | POA: Diagnosis not present

## 2017-09-22 DIAGNOSIS — I2510 Atherosclerotic heart disease of native coronary artery without angina pectoris: Secondary | ICD-10-CM | POA: Diagnosis not present

## 2017-09-23 DIAGNOSIS — I2510 Atherosclerotic heart disease of native coronary artery without angina pectoris: Secondary | ICD-10-CM | POA: Diagnosis not present

## 2017-09-24 DIAGNOSIS — N3941 Urge incontinence: Secondary | ICD-10-CM | POA: Diagnosis not present

## 2017-09-24 DIAGNOSIS — I2510 Atherosclerotic heart disease of native coronary artery without angina pectoris: Secondary | ICD-10-CM | POA: Diagnosis not present

## 2017-09-25 DIAGNOSIS — I2510 Atherosclerotic heart disease of native coronary artery without angina pectoris: Secondary | ICD-10-CM | POA: Diagnosis not present

## 2017-09-26 DIAGNOSIS — I2510 Atherosclerotic heart disease of native coronary artery without angina pectoris: Secondary | ICD-10-CM | POA: Diagnosis not present

## 2017-09-27 ENCOUNTER — Other Ambulatory Visit: Payer: Self-pay

## 2017-09-27 ENCOUNTER — Emergency Department: Payer: Medicare HMO

## 2017-09-27 ENCOUNTER — Encounter: Payer: Self-pay | Admitting: Emergency Medicine

## 2017-09-27 ENCOUNTER — Emergency Department
Admission: EM | Admit: 2017-09-27 | Discharge: 2017-09-27 | Disposition: A | Discharge status: Home / Self Care | Payer: Medicare HMO | Attending: Emergency Medicine | Admitting: Emergency Medicine

## 2017-09-27 DIAGNOSIS — R05 Cough: Secondary | ICD-10-CM | POA: Insufficient documentation

## 2017-09-27 DIAGNOSIS — Z7982 Long term (current) use of aspirin: Secondary | ICD-10-CM | POA: Insufficient documentation

## 2017-09-27 DIAGNOSIS — F17210 Nicotine dependence, cigarettes, uncomplicated: Secondary | ICD-10-CM | POA: Insufficient documentation

## 2017-09-27 DIAGNOSIS — Z7984 Long term (current) use of oral hypoglycemic drugs: Secondary | ICD-10-CM | POA: Insufficient documentation

## 2017-09-27 DIAGNOSIS — J449 Chronic obstructive pulmonary disease, unspecified: Secondary | ICD-10-CM | POA: Insufficient documentation

## 2017-09-27 DIAGNOSIS — R062 Wheezing: Secondary | ICD-10-CM | POA: Diagnosis not present

## 2017-09-27 DIAGNOSIS — I2510 Atherosclerotic heart disease of native coronary artery without angina pectoris: Secondary | ICD-10-CM | POA: Diagnosis not present

## 2017-09-27 DIAGNOSIS — R0602 Shortness of breath: Secondary | ICD-10-CM | POA: Insufficient documentation

## 2017-09-27 DIAGNOSIS — I252 Old myocardial infarction: Secondary | ICD-10-CM | POA: Insufficient documentation

## 2017-09-27 DIAGNOSIS — Z79899 Other long term (current) drug therapy: Secondary | ICD-10-CM | POA: Diagnosis not present

## 2017-09-27 DIAGNOSIS — E1122 Type 2 diabetes mellitus with diabetic chronic kidney disease: Secondary | ICD-10-CM | POA: Insufficient documentation

## 2017-09-27 DIAGNOSIS — R0600 Dyspnea, unspecified: Secondary | ICD-10-CM | POA: Insufficient documentation

## 2017-09-27 DIAGNOSIS — I509 Heart failure, unspecified: Secondary | ICD-10-CM | POA: Diagnosis not present

## 2017-09-27 DIAGNOSIS — J40 Bronchitis, not specified as acute or chronic: Secondary | ICD-10-CM | POA: Diagnosis not present

## 2017-09-27 DIAGNOSIS — R0981 Nasal congestion: Secondary | ICD-10-CM | POA: Insufficient documentation

## 2017-09-27 DIAGNOSIS — I130 Hypertensive heart and chronic kidney disease with heart failure and stage 1 through stage 4 chronic kidney disease, or unspecified chronic kidney disease: Secondary | ICD-10-CM | POA: Diagnosis not present

## 2017-09-27 DIAGNOSIS — Z8673 Personal history of transient ischemic attack (TIA), and cerebral infarction without residual deficits: Secondary | ICD-10-CM | POA: Diagnosis not present

## 2017-09-27 DIAGNOSIS — N183 Chronic kidney disease, stage 3 (moderate): Secondary | ICD-10-CM | POA: Diagnosis not present

## 2017-09-27 DIAGNOSIS — R079 Chest pain, unspecified: Secondary | ICD-10-CM | POA: Diagnosis not present

## 2017-09-27 DIAGNOSIS — E039 Hypothyroidism, unspecified: Secondary | ICD-10-CM | POA: Insufficient documentation

## 2017-09-27 DIAGNOSIS — R000 Tachycardia, unspecified: Secondary | ICD-10-CM | POA: Diagnosis present

## 2017-09-27 LAB — CBC
HEMATOCRIT: 36.8 % (ref 35.0–47.0)
HEMOGLOBIN: 12.3 g/dL (ref 12.0–16.0)
MCH: 29.8 pg (ref 26.0–34.0)
MCHC: 33.5 g/dL (ref 32.0–36.0)
MCV: 88.9 fL (ref 80.0–100.0)
Platelets: 259 10*3/uL (ref 150–440)
RBC: 4.14 MIL/uL (ref 3.80–5.20)
RDW: 16.5 % — AB (ref 11.5–14.5)
WBC: 9.3 10*3/uL (ref 3.6–11.0)

## 2017-09-27 LAB — TROPONIN I
Troponin I: <0.03 ng/mL (ref <0.03)
Troponin I: <0.03 ng/mL (ref <0.03)

## 2017-09-27 LAB — BASIC METABOLIC PANEL
Anion gap: 8 (ref 5–15)
BUN: 12 mg/dL (ref 6–20)
CO2: 25 mmol/L (ref 22–32)
Calcium: 9.2 mg/dL (ref 8.9–10.3)
Chloride: 107 mmol/L (ref 98–111)
Creatinine, Ser: 0.66 mg/dL (ref 0.44–1.00)
GFR calc Af Amer: >60 mL/min (ref >60)
GLUCOSE: 129 mg/dL — AB (ref 70–99)
POTASSIUM: 4.3 mmol/L (ref 3.5–5.1)
Sodium: 140 mmol/L (ref 135–145)

## 2017-09-27 MED ORDER — AZITHROMYCIN 250 MG PO TABS: 250.0000 mg | ORAL_TABLET | Freq: Every day | ORAL | 0 refills | Status: DC

## 2017-09-27 MED ORDER — PREDNISONE 20 MG PO TABS: 40.0000 mg | ORAL_TABLET | Freq: Every day | ORAL | 0 refills | Status: DC

## 2017-09-27 MED ORDER — AZITHROMYCIN 500 MG PO TABS
500.0000 mg | ORAL_TABLET | Freq: Once | ORAL | Status: AC
  Administered 2017-09-27: 500 mg via ORAL
  Filled 2017-09-27: qty 1

## 2017-09-27 MED ORDER — PREDNISONE 20 MG PO TABS
60.0000 mg | ORAL_TABLET | Freq: Once | ORAL | Status: AC
  Administered 2017-09-27: 60 mg via ORAL
  Filled 2017-09-27: qty 3

## 2017-09-27 NOTE — ED Provider Notes (Signed)
[facility] Emergency Department Provider Note  Time seen: 8:47 PM  I have reviewed the triage vital signs and the nursing notes.   HISTORY  Chief Complaint Chest Pain    HPI Marie Clarke is a 47 y.o. female with a past medical history of anemia, anxiety, CAD, CHF, COPD, diabetes, presents to the emergency department for high blood pressure and intermittent fast heart rate.  According to the patient for the past 2 to 3 days she has had intermittent elevated blood pressure size 376 systolic today and states her heart rate has been going up and down as well.  This is her main concern, she states is secondary concern she is also been coughing for the past week and has had mild shortness of breath and wheeze as well.  States she had a low-grade fever 3 days ago but has not had a fever since.  States mild congestion as well.  Patient denies any chest pain.   Past Medical History:  Diagnosis Date  . Anemia   . Anxiety   . Asthma   . Bipolar disorder ([place])   . CAD (coronary artery disease) unk  . CHF (congestive heart failure) ([place])   . COPD (chronic obstructive pulmonary disease) ([place])   . Depression unk  . Diabetes mellitus without complication ([place])   . Diabetes mellitus, type II ([place])   . Drug overdose   . GERD (gastroesophageal reflux disease)   . Headache   . Hyperlipidemia   . Hypertension   . Left leg pain 04/29/2014  . MI (myocardial infarction) ([place])   . Muscle ache 09/16/2014  . Osteoporosis   . Overactive bladder   . Pancreatitis unk  . Reflex sympathetic dystrophy   . Renal insufficiency   . Sleep apnea    pt reported on 2/6/7 she currently is not using CPAP  . Sleep apnea   . Stroke ([place])   . Thyroid disease   . TIA (transient ischemic attack) unk  . TIA (transient ischemic attack)     Patient Active Problem List   Diagnosis Date Noted  . Elevated C-reactive protein (CRP) 09/10/2017  . Elevated sed rate 09/10/2017  . Chronic neck pain  (Fourth Area of Pain) (L>R) 09/09/2017  . Chronic left shoulder pain 09/09/2017  . Pharmacologic therapy 09/09/2017  . Disorder of skeletal system 09/09/2017  . Problems influencing health status 09/09/2017  . Long term current use of opiate analgesic 09/09/2017  . Tobacco use disorder 07/16/2017  . Ventral hernia without obstruction or gangrene 06/25/2017  . Strain of extensor muscle, fascia and tendon of left index finger at wrist and hand level, initial encounter 05/16/2017  . Sepsis ([place]) 04/14/2017  . Osteopenia 04/03/2017  . Hypotension 09/17/2016  . Contusion of knee, left 10/12/2015  . Sprain of left ankle 10/12/2015  . Strain of left knee 10/12/2015  . Ankle instability 07/20/2015  . Incidental lung nodule 04/28/2015  . Chronic pain 03/28/2015  . Chronic low back pain (Location of Primary Source of Pain) (Bilateral) (L>R) 03/28/2015  . Chronic lower extremity pain (Left) 03/28/2015  . Abdominal wound dehiscence 03/28/2015  . Encounter for pain management planning 03/28/2015  . Morbid obesity ([place]) 03/28/2015  . Abnormal CT scan, lumbar spine 03/28/2015  . Lumbar facet hypertrophy 03/28/2015  . Lumbar facet syndrome (Location of Primary Source of Pain) (Bilateral) (L>R) 03/28/2015  . Lumbar foraminal stenosis (Bilateral) (L5-S1) 03/28/2015  . Chronic ankle pain (Left) 03/28/2015  . CRPS (complex regional pain syndrome)  type I of lower limb (left ankle) 03/28/2015  . Neurogenic pain 03/28/2015  . Neuropathic pain 03/28/2015  . Myofascial pain 03/28/2015  . History of suicide attempt 03/28/2015  . 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 Abnormal gait 12/15/2014  . Congestive heart failure ([place]) 11/15/2014  . Abdominal wall abscess 09/20/2014  . 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 Diabetes mellitus, type 2 ([place]) 08/13/2014  . Bipolar affective disorder ([place]) 08/13/2014  . Type 2 diabetes mellitus ([place]) 08/13/2014  . Rectal prolapse 08/09/2014  . Rectal bleeding 08/09/2014   . Rectal bleed 08/09/2014  . Affective bipolar disorder ([place]) 08/05/2014  . Arteriosclerosis of coronary artery 08/05/2014  . CCF (congestive cardiac failure) ([place]) 08/05/2014  . Chronic kidney disease 08/05/2014  . 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 Apnea, sleep 08/05/2014  . Temporary cerebral vascular dysfunction 08/05/2014  . Polypharmacy 04/29/2014  . Other long term (current) drug therapy 04/29/2014  . 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 Chronic kidney disease, stage III (moderate) ([place]) 12/14/2013  . Controlled diabetes mellitus type II without complication ([place]) 09/38/1829  . Essential (primary) hypertension 12/03/2013  . Adult hypothyroidism 12/03/2013  . Controlled type 2 diabetes mellitus without complication ([place]) 93/71/6967    Past Surgical History:  Procedure Laterality Date  . ABDOMINAL HYSTERECTOMY    . HERNIA REPAIR  07/15/2017  . prolapse rectum surgery N/A July 2016  . TONSILLECTOMY      Prior to Admission medications   Medication Sig Start Date End Date Taking? Authorizing Provider  acetaminophen (TYLENOL) 325 MG tablet Take 1 tablet (325 mg total) by mouth every 6 (six) hours as needed for mild pain (or Fever >/= 101). 09/18/16   Gouru, Aruna, MD  albuterol (PROVENTIL HFA;VENTOLIN HFA) 108 (90 BASE) MCG/ACT inhaler Inhale 2 puffs into the lungs every 4 (four) hours as needed for wheezing or shortness of breath.    [provider]  aspirin EC 81 MG tablet Take 81 mg by mouth daily at 12 noon.    [provider]  atorvastatin (LIPITOR) 40 MG tablet Take 40 mg by mouth at bedtime.     [provider]  azelastine (ASTELIN) 0.1 % nasal spray Place 1 spray into both nostrils daily.    [provider]  bumetanide (BUMEX) 2 MG tablet Take 1 tablet (2 mg total) by mouth 2 (two) times daily. 09/20/16 09/20/17  Nicholes Mango, MD  busPIRone (BUSPAR) 15 MG tablet Take 1 tablet (15 mg total) by mouth 3 (three) times daily. 08/05/17    Ursula Alert, MD  [facility] NICOTINE POLACRILEX 4 MG lozenge  08/15/17   [provider]  gabapentin (NEURONTIN) 600 MG tablet Take 1,200 mg by mouth 3 (three) times daily.  04/19/16   [provider]  glipiZIDE (GLUCOTROL XL) 10 MG 24 hr tablet Take 10 mg by mouth.  03/20/16 01/26/17  [provider]  HYDROcodone-acetaminophen (NORCO/VICODIN) 5-325 MG tablet Take 1 tablet by mouth every 6 (six) hours as needed for moderate pain. Patient not taking: Reported on 09/09/2017 07/08/17   [name], PA-C  hydrOXYzine (ATARAX/VISTARIL) 10 MG tablet Take 1-3 tablets (10-30 mg total) by mouth 2 (two) times daily as needed (for severe anxiety sx). 08/05/17   Ursula Alert, MD  ibuprofen (ADVIL,MOTRIN) 800 MG tablet Take 800 mg by mouth every 8 (eight) hours as needed.    [provider]  KLOR-CON M20 20 MEQ tablet Take 20 mEq by mouth daily.  10/13/15   [provider]  lamoTRIgine (LAMICTAL) 25  MG tablet Take 1-2 tablets (25-50 mg total) by mouth daily. Take 25 mg for 2 weeks and increase to 50 mg 08/05/17   Ursula Alert, MD  levocetirizine (XYZAL) 5 MG tablet  08/05/17   [provider]  levothyroxine (SYNTHROID, LEVOTHROID) 200 MCG tablet Take 200 mcg by mouth daily.     [provider]  levothyroxine (SYNTHROID, LEVOTHROID) 25 MCG tablet Take 25 mcg by mouth daily before breakfast.  06/13/16 09/09/17  [provider]  metFORMIN (GLUCOPHAGE) 500 MG tablet  08/26/17   [provider]  methocarbamol (ROBAXIN) 500 MG tablet 1-2 tablets every 6 hours prn muscle spasms. 07/08/17   [name], PA-C  montelukast (SINGULAIR) 10 MG tablet Take 10 mg by mouth daily.     [provider]  MYRBETRIQ 50 MG TB24 tablet  08/19/17   [provider]  omega-3 fish oil (MAXEPA) 1000 MG CAPS capsule  08/15/17   [provider]  ondansetron (ZOFRAN ODT) 4 MG disintegrating tablet Take 1 tablet (4 mg total) by mouth every 8  (eight) hours as needed for nausea or vomiting. Patient not taking: Reported on 09/09/2017 02/25/16   Earleen Newport, MD  ondansetron (ZOFRAN ODT) 4 MG disintegrating tablet Take 1 tablet (4 mg total) by mouth every 8 (eight) hours as needed for nausea or vomiting. Patient not taking: Reported on 09/09/2017 08/01/17   Earleen Newport, MD  oxyCODONE-acetaminophen (PERCOCET) 7.5-325 MG tablet Take 1 tablet by mouth every 4 (four) hours as needed for severe pain. Patient not taking: Reported on 09/09/2017 08/01/17 08/01/18  Earleen Newport, MD  oxyCODONE-acetaminophen (PERCOCET/ROXICET) 5-325 MG tablet Take 1 tablet by mouth every 4 (four) hours as needed for severe pain. Patient not taking: Reported on 09/09/2017 07/13/17   Paulette Blanch, MD  promethazine (PHENERGAN) 25 MG tablet Take 1 tablet (25 mg total) by mouth every 6 (six) hours as needed for nausea or vomiting. 08/01/17   Earleen Newport, MD  QUEtiapine (SEROQUEL) 25 MG tablet Take 3 tablets (75 mg total) by mouth at bedtime. 08/05/17   Ursula Alert, MD  ranitidine (ZANTAC) 300 MG tablet Take 1 tablet (300 mg total) by mouth 2 (two) times daily. 11/30/14   Carrie Mew, MD  tiotropium (SPIRIVA) 18 MCG inhalation capsule Place 18 mcg into inhaler and inhale at bedtime.    [provider]  traMADol Veatrice Bourbon) 50 MG tablet  09/02/17   [provider]  Donnal Debar 100-62.5-25 MCG/INH AEPB  08/19/17   [provider]    Allergies  Allergen Reactions  . Diazepam Hives and Nausea And Vomiting  . Levofloxacin Hives and Rash    1/31: would like to retry since not taking valium  . Metronidazole Hives    1/31: would like to retry since she is no longer taking valium  . Pregabalin Hives and Rash    1/31 currently taking lyrica without reaction since no longer taking valium  . Sulfa Antibiotics Hives  . Cephalexin Hives  . Ciprofloxacin Hives  . Vancomycin Hives  . Ziprasidone Hcl Other (See Comments)     CONFUSED CONFUSED Other reaction(s): Other (See Comments) CONFUSED CONFUSED   . Penicillins Nausea And Vomiting    Has patient had a PCN reaction causing immediate rash, facial/tongue/throat swelling, SOB or lightheadedness with hypotension: No Has patient had a PCN reaction causing severe rash involving mucus membranes or skin necrosis: No Has patient had a PCN reaction that required hospitalization: No Has patient had  a PCN reaction occurring within the last 10 years: No If all of the above answers are "NO", then may proceed with Cephalosporin use.     Family History  Problem Relation Age of Onset  . Diabetes Mellitus II Mother   . CAD Mother   . Sleep apnea Mother   . Osteoarthritis Mother   . Osteoporosis Mother   . Anxiety disorder Mother   . Depression Mother   . Bipolar disorder Mother   . Bipolar disorder Father   . Hypertension Father   . Depression Father   . Anxiety disorder Father   . Post-traumatic stress disorder Sister     Social History Social History   Tobacco Use  . Smoking status: Current Every Day Smoker    Packs/day: 1.00    Types: Cigarettes    Last attempt to quit: 06/12/2016    Years since quitting: 1.2  . Smokeless tobacco: Never Used  Substance Use Topics  . Alcohol use: No    Alcohol/week: 0.0 standard drinks  . Drug use: No    Review of Systems Constitutional: Fever 3 days ago, now resolved Eyes: Negative for visual complaints ENT: Mild congestion Cardiovascular: Negative for chest pain. Respiratory: Positive for mild shortness of breath with cough Gastrointestinal: Negative for abdominal pain, vomiting Genitourinary: Negative for urinary compaints Musculoskeletal: Negative for musculoskeletal complaints Skin: Negative for skin complaints  Neurological: Negative for headache All other ROS negative  ____________________________________________   PHYSICAL EXAM:  VITAL SIGNS: ED Triage Vitals  Enc Vitals Group     BP  09/27/17 1733 129/81     Pulse Rate 09/27/17 1654 94     Resp 09/27/17 1654 16     Temp 09/27/17 1654 98.9 F (37.2 C)     Temp Source 09/27/17 1654 Oral     SpO2 09/27/17 1654 96 %     Weight 09/27/17 1655 225 lb (102.1 kg)     Height 09/27/17 1655 5\' 4"  (1.626 m)     Head Circumference --      Peak Flow --      Pain Score 09/27/17 1655 6     Pain Loc --      Pain Edu? --      Excl. in [place]? --    Constitutional: Alert and oriented. Well appearing and in no distress. Eyes: Normal exam ENT   Head: Normocephalic and atraumatic   Mouth/Throat: Mucous membranes are moist. Cardiovascular: Normal rate, regular rhythm, 100 bpm.  No murmur. Respiratory: Normal respiratory effort without tachypnea nor retractions.  Mild expiratory wheezes bilaterally. Gastrointestinal: Soft and nontender. No distention.   Musculoskeletal: Nontender with normal range of motion in all extremities. No lower extremity tenderness or edema. Neurologic:  Normal speech and language. No gross focal neurologic deficits Skin:  Skin is warm, dry and intact.  Psychiatric: Mood and affect are normal.  ____________________________________________    EKG  EKG reviewed and interpreted by myself shows normal sinus rhythm at 99 bpm with a narrow QRS, normal axis, normal intervals, nonspecific ST changes.  ____________________________________________    RADIOLOGY  Chest x-ray is negative  ____________________________________________   INITIAL IMPRESSION / ASSESSMENT AND PLAN / ED COURSE  Pertinent labs & imaging results that were available during my care of the patient were reviewed by me and considered in my medical decision making (see chart for details).  Patient presents emergency department for high blood pressure intermittently as well as intermittent fast heart rate.  Currently the patient appears very well  denies any chest pain at any point.  Patient's labs are reassuring including negative  troponin.  Chest x-ray shows mild bronchitic changes but no other acute abnormality.  We will dose steroids and Zithromax given the patient's frequent cough with expiratory wheeze on examination the cover for possible acute bronchitis.  Patient agreeable to this plan of care.  I discussed using breathing treatments in the emergency department, patient states she wishes to go home and will use a breathing treatment at home.  Patient will follow-up with her doctor.  ____________________________________________   FINAL CLINICAL IMPRESSION(S) / ED DIAGNOSES  Bronchitis Dyspnea    Harvest Dark, MD 09/27/17 2050

## 2017-09-27 NOTE — ED Triage Notes (Signed)
First Nurse Note:  Arrives from [facility] for evaluation of 3 day history of CP, SPB, elevated BP, and tachycardia.  Patient has been off of BP meds x 1 year.  AAOx3.  Skin warm and dry.  No SOB/ DOE. NAD

## 2017-09-27 NOTE — ED Notes (Signed)
AAOx3.  Skin warm and dry.  NAD. Up walking independently in waiting area.  Eating and drinking .  NAD

## 2017-09-27 NOTE — ED Triage Notes (Signed)
Pt to ED from [facility] with c/o CP that started a few days ago, also states hypertension. Hx of HTN , states was taken off x43yr ago. Pt A&OX4

## 2017-09-28 DIAGNOSIS — I2510 Atherosclerotic heart disease of native coronary artery without angina pectoris: Secondary | ICD-10-CM | POA: Diagnosis not present

## 2017-09-29 DIAGNOSIS — I2510 Atherosclerotic heart disease of native coronary artery without angina pectoris: Secondary | ICD-10-CM | POA: Diagnosis not present

## 2017-09-29 DIAGNOSIS — J441 Chronic obstructive pulmonary disease with (acute) exacerbation: Secondary | ICD-10-CM | POA: Diagnosis not present

## 2017-09-30 DIAGNOSIS — I2510 Atherosclerotic heart disease of native coronary artery without angina pectoris: Secondary | ICD-10-CM | POA: Diagnosis not present

## 2017-10-01 DIAGNOSIS — I2510 Atherosclerotic heart disease of native coronary artery without angina pectoris: Secondary | ICD-10-CM | POA: Diagnosis not present

## 2017-10-01 NOTE — Progress Notes (Signed)
Patient's Name: Marie Clarke  MRN: [id number]  Referring Provider: Sharyne Peach, MD  DOB: October 25, 1970  PCP: Sharyne Peach, MD  DOS: 10/02/2017  Note by: Gaspar Cola, MD  Service setting: Ambulatory outpatient  Specialty: Interventional Pain Management  Location: [facility] (AMB) Pain Management Facility    Patient type: Established   Primary Reason(s) for Visit: Encounter for evaluation before starting new chronic pain management plan of care (Level of risk: moderate) CC: Back Pain (low)  HPI  [name] is a 47 y.o. year old, female patient, who comes today for a follow-up evaluation to review the test results and decide on a treatment plan. She has Affective bipolar disorder ([place]); Arteriosclerosis of coronary artery; CCF (congestive cardiac failure) ([place]); Chronic kidney disease, stage III (moderate) ([place]); Chronic kidney disease; Controlled diabetes mellitus type II without complication ([place]); Essential (primary) hypertension; Polypharmacy; Adult hypothyroidism; Detrusor muscle hypertonia; Algodystrophic syndrome; Apnea, sleep; Temporary cerebral vascular dysfunction; Rectal prolapse; Rectal bleeding; Rectal bleed; Detrusor dyssynergia; Diabetes mellitus, type 2 ([place]); Abdominal wall abscess; Bipolar affective disorder ([place]); Congestive heart failure ([place]); Controlled type 2 diabetes mellitus without complication ([place]); Other long term (current) drug therapy; Type 2 diabetes mellitus ([place]); Abnormal gait; Neurosis, posttraumatic; Chronic pain; Chronic low back pain (Primary Area of Pain) (Bilateral) (L>R); Chronic lower extremity pain (Secondary Area of Pain) (Left); Abdominal wound dehiscence; Encounter for pain management planning; Morbid obesity ([place]); Abnormal CT scan, lumbar spine; Lumbar facet hypertrophy; Lumbar facet syndrome (Bilateral) (L>R); Lumbar foraminal stenosis (Bilateral) (L5-S1); Chronic ankle pain [facility] Area of Pain) (Left); Neurogenic pain; Neuropathic pain; Myofascial pain;  History of suicide attempt; Incidental lung nodule; Contusion of knee (Left); Strain of knee (Left); Hypotension; Sepsis ([place]); Osteopenia; Strain of extensor muscle, fascia and tendon of left index finger at wrist and hand level, initial encounter; Tobacco use disorder; Ventral hernia without obstruction or gangrene; Chronic neck pain (Fourth Area of Pain) (Bilateral) (L>R); Pharmacologic therapy; Disorder of skeletal system; Problems influencing health status; Long term current use of opiate analgesic; Elevated C-reactive protein (CRP); Elevated sed rate; Chronic pain syndrome; Spondylosis without myelopathy or radiculopathy, lumbosacral region; Chronic musculoskeletal pain; Chronic shoulder pain (Fifth Area of Pain) (Bilateral) (L>R); CRPS (complex regional pain syndrome) type 1 of lower limb (left ankle); Ankle joint instability (Left); Ankle sprain, sequela (Left); and History of psychiatric symptoms on their problem list. Her primarily concern today is the Back Pain (low)  Pain Assessment: Location: Lower Back Radiating: radiates into left leg to ankle in the back and side Onset: More than a month ago Duration: Chronic pain Quality: Aching, Dull, Pressure Severity: 8 /10 (subjective, self-reported pain score)  Note: Reported level is compatible with observation.                         When using our objective Pain Scale, levels between 6 and 10/10 are said to belong in an emergency room, as it progressively worsens from a 6/10, described as severely limiting, requiring emergency care not usually available at an outpatient pain management facility. At a 6/10 level, communication becomes difficult and requires great effort. Assistance to reach the emergency department may be required. Facial flushing and profuse sweating along with potentially dangerous increases in heart rate and blood pressure will be evident. Effect on ADL: "I dont feel like doing anything" Timing: Constant Modifying factors:  medications BP: (!) 145/83  HR: (!) 125  [name] comes in today for a follow-up visit after her initial  evaluation on 09/09/2017. Today we went over the results of her tests. These were explained in "L[place]man's terms". During today's appointment we went over my diagnostic impression, as well as the proposed treatment plan.  This patient was initially seen by me on 03/28/2015 for an initial evaluation, due to a primary pain in the lower back. This low back pain is bilateral with the left side being worst on the right. In addition, the patient has left lower extremity pain as her secondary pain. This pain goes all the way down to her ankle and the ankle gives out frequently. The patient has a history of a CRPS of her left ankle diagnosed in [place], around 2005. She describes that the ankle symptoms as consisting of swelling, color changes primarily bluish, and being cold to touch. She also refers having numbness of her foot. I asked her if she had a nerve conduction test done and she was not sure.  Of significance in her case is a long-standing psychiatric history, including a suicide attempt using medications. She also has a history positive for morbid obesity, hypertension, non-insulin-dependent diabetes mellitus, and a recent surgery for a prolapsed bowel. On this visit, the patient had a wound dehisence that had not been properly evaluated and we made arrangements to send her to be seen immediately. The patient returned on 09/09/2017, at which time she was seen by our nurse practitioner.   According to the patient her primary area pain is in her lower back.  She denies any precipitating factors.  She admits that the left is greater than the right.  She denies any previous surgery.  She admits that she did have an epidural steroid injection while in [place] 6-7 years ago.  She admits that it was effective.  She has had physical therapy in the past she completed approximately 3 sessions she  admits that it was not effective.  She has had a recent MRI.  Her second area of pain is in her legs.  She admits that she has pain that goes down her left leg into her ankle.  She has numbness tingling burning and aching pain.  She denies any weakness.  Her third area pain is her left ankle.  She admits that she has a history of a left ankle fracture.  She is unsure of the exact date of injury.  She did not have any type of surgery.  She admits that she has been diagnosed with complex regional pain syndrome.  She denies any interventional therapy, physical therapy. Xray 08/2016.  Her fourth area of pain is in her neck.  She admits the left side is greater than the right.  She denies any previous surgery, interventional therapy or physical therapy.  She has had recent images.  Her last area of pain is in her shoulders.  She admits that the left is greater than the right.  She has pain that goes down into her elbow.  She denies any numbness tingling or weakness in her left arm.  She denies any previous surgery, interventional therapy or recent physical therapy.  She has had previous images of her shoulder.  She was seen here as a patient in 2017 however has never received any treatment.  She was referred here from [facility] pain clinic secondary to travel distance.  In considering the treatment plan options, Ms. Whitacre was reminded that I no longer take patients for medication management only. I asked her to let me know if  she had no intention of taking advantage of the interventional therapies, so that we could make arrangements to provide this space to someone interested. I also made it clear that undergoing interventional therapies for the purpose of getting pain medications is very inappropriate on the part of a patient, and it will not be tolerated in this practice. This type of behavior would suggest true addiction and therefore it requires referral to an addiction  specialist.   Further details on both, my assessment(s), as well as the proposed treatment plan, please see below.  Controlled Substance Pharmacotherapy Assessment REMS (Risk Evaluation and Mitigation Strategy)  Analgesic: Tramadol 50 mg 1 tablet 4 times daily (fill date 09/02/2017) tramadol 200 mg/day Highest recorded MME/day: 90 mg/day MME/day: 20 mg/day Pill Count: None expected due to no prior prescriptions written by our practice. Dewayne Shorter, RN  10/02/2017  1:39 PM  Signed Safety precautions to be maintained throughout the outpatient stay will include: orient to surroundings, keep bed in low position, maintain call bell within reach at all times, provide assistance with transfer out of bed and ambulation.   Pharmacokinetics: Liberation and absorption (onset of action): WNL Distribution (time to peak effect): WNL Metabolism and excretion (duration of action): WNL         Pharmacodynamics: Desired effects: Analgesia: Ms. Denslow reports >50% benefit. Functional ability: Patient reports that medication allows her to accomplish basic ADLs Clinically meaningful improvement in function (CMIF): Sustained CMIF goals met Perceived effectiveness: Described as relatively effective, allowing for increase in activities of daily living (ADL) Undesirable effects: Side-effects or Adverse reactions: None reported Monitoring: [place] PMP: Online review of the past 33-monthperiod previously conducted. Not applicable at this point since we have not taken over the patient's medication management yet. List of other Serum/Urine Drug Screening Test(s):  Lab Results  Component Value Date   COCAINSCRNUR NONE DETECTED 09/17/2016   COCAINSCRNUR NONE DETECTED 06/24/2016   THCU NONE DETECTED 09/17/2016   THCU NONE DETECTED 06/24/2016   ETH <5 06/24/2016   List of all UDS test(s) done:  Lab Results  Component Value Date   TOXASSSELUR FINAL 03/28/2015   SUMMARY FINAL 09/09/2017   Last UDS on  record: ToxAssure Select 13  Date Value Ref Range Status  03/28/2015 FINAL  Final    Comment:    ==================================================================== TOXASSURE SELECT 13 (MW) ==================================================================== Test                             Result       Flag       Units   NO DRUGS DETECTED. ==================================================================== Test                      Result    Flag   Units      Ref Range   Creatinine              59               mg/dL      >=20 ==================================================================== Declared Medications:  The flagging and interpretation on this report are based on the  following declared medications.  Unexpected results may arise from  inaccuracies in the declared medications.  **Note: The testing scope of this panel does not include following  reported medications:  Cyclobenzaprine  Pregabalin (Lyrica) ==================================================================== For clinical consultation, please call [phone] ====================================================================    Summary  Date Value Ref Range Status  09/09/2017 FINAL  Final    Comment:    ==================================================================== TOXASSURE COMP DRUG ANALYSIS,UR ==================================================================== Test                             Result       Flag       Units Drug Present and Declared for Prescription Verification   Tramadol                       6446         EXPECTED   ng/mg creat   O-Desmethyltramadol            4034         EXPECTED   ng/mg creat   N-Desmethyltramadol            1874         EXPECTED   ng/mg creat    Source of tramadol is a prescription medication.    O-desmethyltramadol and N-desmethyltramadol are expected    metabolites of tramadol.   Gabapentin                     PRESENT      EXPECTED    Methocarbamol                  PRESENT      EXPECTED   Quetiapine                     PRESENT      EXPECTED   Ibuprofen                      PRESENT      EXPECTED   Hydroxyzine                    PRESENT      EXPECTED Drug Present not Declared for Prescription Verification   Nortriptyline                  PRESENT      UNEXPECTED    Nortriptyline may be administered as a prescription drug; it is    also an expected metabolite of amitriptyline.   Naproxen                       PRESENT      UNEXPECTED Drug Absent but Declared for Prescription Verification   Hydrocodone                    Not Detected UNEXPECTED ng/mg creat   Oxycodone                      Not Detected UNEXPECTED ng/mg creat   Lamotrigine                    Not Detected UNEXPECTED   Acetaminophen                  Not Detected UNEXPECTED    Acetaminophen, as indicated in the declared medication list, is    not always detected even when used as directed.   Salicylate                     Not Detected UNEXPECTED    Aspirin, as indicated in the declared medication list, is not    always  detected even when used as directed.   Promethazine                   Not Detected UNEXPECTED ==================================================================== Test                      Result    Flag   Units      Ref Range   Creatinine              35               mg/dL      >=20 ==================================================================== Declared Medications:  The flagging and interpretation on this report are based on the  following declared medications.  Unexpected results may arise from  inaccuracies in the declared medications.  **Note: The testing scope of this panel includes these medications:  Gabapentin (Neurontin)  Hydrocodone (Hydrocodone-Acetaminophen)  Hydroxyzine  Lamotrigine  Methocarbamol  Oxycodone (Oxycodone Acetaminophen)  Promethazine  Quetiapine  Tramadol  **Note: The testing scope of this panel does not  include small to  moderate amounts of these reported medications:  Acetaminophen  Acetaminophen (Hydrocodone-Acetaminophen)  Acetaminophen (Oxycodone Acetaminophen)  Aspirin (Aspirin 81)  Ibuprofen  **Note: The testing scope of this panel does not include following  reported medications:  Albuterol  Atorvastatin  Azelastine  Bumetanide  Buspirone  Fluticasone (Trelegy Ellipta)  Glipizide  Levocetirizine  Levothyroxine  Metformin  Mirabegron  Montelukast  Nicotine  Omega-3 Fatty Acids  Ondansetron  Potassium  Ranitidine  Tiotropium  Umeclidinium (Trelegy Ellipta)  Vilanterol (Trelegy Ellipta) ==================================================================== For clinical consultation, please call [phone]. ====================================================================    UDS interpretation: No unexpected findings.          Medication Assessment Form: Patient introduced to form today Treatment compliance: Treatment may start today if patient agrees with proposed plan. Evaluation of compliance is not applicable at this point Risk Assessment Profile: Aberrant behavior: See initial evaluations. None observed or detected today Comorbid factors increasing risk of overdose: See initial evaluation. No additional risks detected today Medical Psychology Evaluation: Please see scanned results in medical record. Opioid risk tool (ORT) (Total Score):   Personal History of Substance Abuse (SUD-Substance use disorder):  Alcohol:    Illegal Drugs:    Rx Drugs:    ORT Risk Level calculation:   Risk of substance use disorder (SUD): Low  ORT Scoring interpretation table:  Score <3 = Low Risk for SUD  Score between 4-7 = Moderate Risk for SUD  Score >8 = High Risk for Opioid Abuse   Risk Mitigation Strategies:  Patient opioid safety counseling: Completed today. Counseling provided to patient as per "Patient Counseling Document". Document signed by patient, attesting to  counseling and understanding Patient-Prescriber Agreement (PPA): Obtained today.  Controlled substance notification to other providers: Written and sent today.  Pharmacologic Plan: Today we may be taking over the patient's pharmacological regimen. See below.             Laboratory Chemistry  Inflammation Markers (CRP: Acute Phase) (ESR: Chronic Phase) Lab Results  Component Value Date   CRP 21 (H) 09/09/2017   ESRSEDRATE 50 (H) 09/09/2017   LATICACIDVEN 1.9 04/15/2017                         Rheumatology Markers Lab Results  Component Value Date   RF <10.0 09/09/2017   ANA Negative 09/09/2017  Renal Function Markers Lab Results  Component Value Date   BUN 12 09/27/2017   CREATININE 0.66 09/27/2017   BCR 16 09/09/2017   GFRAA >60 09/27/2017   GFRNONAA >60 09/27/2017                             Hepatic Function Markers Lab Results  Component Value Date   AST 16 09/09/2017   ALT 14 08/10/2017   ALBUMIN 4.6 09/09/2017   ALKPHOS 126 (H) 09/09/2017   LIPASE 44 08/10/2017                        Electrolytes Lab Results  Component Value Date   NA 140 09/27/2017   K 4.3 09/27/2017   CL 107 09/27/2017   CALCIUM 9.2 09/27/2017   MG 1.9 09/09/2017                        Neuropathy Markers Lab Results  Component Value Date   QMVHQION62 952 09/09/2017   HIV Non Reactive 09/18/2016                        Bone Pathology Markers Lab Results  Component Value Date   25OHVITD1 43 09/09/2017   25OHVITD2 <1.0 09/09/2017   25OHVITD3 43 09/09/2017                         Coagulation Parameters Lab Results  Component Value Date   INR 0.92 09/20/2016   LABPROT 12.3 09/20/2016   APTT <24 (L) 09/20/2016   PLT 259 09/27/2017                        Cardiovascular Markers Lab Results  Component Value Date   BNP 24.0 07/04/2016   TROPONINI <0.03 09/27/2017   HGB 12.3 09/27/2017   HCT 36.8 09/27/2017                         CA Markers No  results found.  Note: Lab results reviewed.  Recent Diagnostic Imaging Review  Cervical Imaging: Cervical DG 2-3 views:  Results for orders placed during the hospital encounter of 07/08/17  DG Cervical Spine 2-3 Views   Narrative CLINICAL DATA:  Left-sided neck pain.  EXAM: CERVICAL SPINE - 2-3 VIEW  COMPARISON:  Neck CT 06/25/2016  FINDINGS: There is no evidence of cervical spine fracture or prevertebral soft tissue swelling. Alignment is normal. Mild osteoarthritic changes at C3-C4, C5-C6 and C6-C7, with mild disc space narrowing and endplate remodeling.  IMPRESSION: No evidence of fracture or alignment abnormality.  Mild osteoarthritic changes at C3-C4, C5-C6 and C6-C7.   Electronically Signed   By: Fidela Salisbury M.D.   On: 07/08/2017 09:55    Shoulder Imaging: Shoulder-L DG:  Results for orders placed during the hospital encounter of 07/12/17  DG Shoulder Left   Narrative CLINICAL DATA:  Acute onset of diffuse left shoulder pain.  EXAM: LEFT SHOULDER - 2+ VIEW  COMPARISON:  None.  FINDINGS: There is no evidence of fracture or dislocation. The left humeral head is seated within the glenoid fossa. The acromioclavicular joint is unremarkable in appearance. No significant soft tissue abnormalities are seen. The visualized portions of the left lung are clear.  IMPRESSION: No evidence of fracture or dislocation.   Electronically Signed  By: Garald Balding M.D.   On: 07/13/2017 00:05    Thoracic Imaging: Thoracic DG 2-3 views:  Results for orders placed during the hospital encounter of 08/02/15  DG Thoracic Spine 2 View   Narrative CLINICAL DATA:  Status post fall from a porch this morning. Thoracic spine pain. Initial encounter.  EXAM: THORACIC SPINE 2 VIEWS  COMPARISON:  CT chest 04/23/2015.  PA and lateral chest 07/01/2015.  FINDINGS: There is no evidence of thoracic spine fracture. Alignment is normal. No other significant bone  abnormalities are identified.  IMPRESSION: Negative exam.   Electronically Signed   By: Inge Rise M.D.   On: 08/02/2015 14:01    Lumbosacral Imaging: Lumbar MR wo contrast:  Results for orders placed during the hospital encounter of 01/18/17  MR LUMBAR SPINE WO CONTRAST   Narrative CLINICAL DATA:  Lumbar radiculopathy with left leg pain and weakness  EXAM: MRI LUMBAR SPINE WITHOUT CONTRAST  TECHNIQUE: Multiplanar, multisequence MR imaging of the lumbar spine was performed. No intravenous contrast was administered.  COMPARISON:  Lumbar radiographs 10/23/2013  FINDINGS: Segmentation:  S1 is a transitional vertebra.  Normal segmentation  Alignment:  Slight retrolisthesis L4-5 and L5-S1  Vertebrae: Negative for fracture or mass. Large hemangioma L2 vertebral body.  Conus medullaris and cauda equina: Conus extends to the L1-2 level. Conus and cauda equina appear normal.  Paraspinal and other soft tissues: Negative  Disc levels:  T12-L1:  Mild disc degeneration  L1-2:  Mild disc degeneration  L2-3:  Negative  L3-4:  Mild disc degeneration without stenosis  L4-5: Mild disc degeneration and disc bulging. Mild facet hypertrophy. No significant stenosis.  L5-S1: Small central disc protrusion. Bilateral facet hypertrophy with mild subarticular stenosis bilaterally.  IMPRESSION: Multilevel mild lumbar degenerative changes as above. Small central disc protrusion L5-S1 with mild subarticular stenosis bilaterally L5-S1.   Electronically Signed   By: Franchot Gallo M.D.   On: 01/18/2017 14:51    Lumbar CT wo contrast:  Results for orders placed during the hospital encounter of 03/03/15  CT Lumbar Spine Wo Contrast   Narrative CLINICAL DATA:  Chronic low back pain.  EXAM: CT LUMBAR SPINE WITHOUT CONTRAST  TECHNIQUE: Multidetector CT imaging of the lumbar spine was performed without intravenous contrast administration. Multiplanar CT  image reconstructions were also generated.  COMPARISON:  Lumbar spine x-ray October 23, 2013  FINDINGS: No malalignment is identified. There is no significant canal narrowing. No fractures are seen. Small posterior disc bulges are seen at L4-5 and L5-S1. Degenerative changes are seen in the L4-5 facets. No other significant degenerative changes are identified.  There is mild narrowing of the bilateral L5-S1 neural foramina.  IMPRESSION: 1. Lower lumbar facet degenerative changes primarily at L4. Disc bulges at L4-5 and L5-S1. Mild narrowing of the bilateral L5-S1 neural foramina.   Electronically Signed   By: Dorise Bullion III M.D   On: 03/03/2015 11:22    Lumbar DG 2-3 views:  Results for orders placed during the hospital encounter of 08/02/15  DG Lumbar Spine 2-3 Views   Narrative CLINICAL DATA:  Status post fall off a porch this morning. The patient landed on her back. Pain. Initial encounter.  EXAM: LUMBAR SPINE - 2-3 VIEW  COMPARISON:  CT lumbar spine 03/03/2015.  FINDINGS: There is no evidence of lumbar spine fracture. Alignment is normal. Intervertebral disc spaces are maintained. Surgical clips in the pelvis are incidentally noted  IMPRESSION: Negative exam.   Electronically Signed   By: Inge Rise  M.D.   On: 08/02/2015 13:59    Knee Imaging: Knee-L MR w contrast:  Results for orders placed during the hospital encounter of 09/23/15  MR Knee Left  Wo Contrast   Narrative CLINICAL DATA:  Left knee pain since a fall in June, 2017 when the patient's ankle gave way. Swelling. Subsequent encounter.  EXAM: MRI OF THE LEFT KNEE WITHOUT CONTRAST  TECHNIQUE: Multiplanar, multisequence MR imaging of the knee was performed. No intravenous contrast was administered.  COMPARISON:  Plain films left knee 07/19/2015.  FINDINGS: MENISCI  Medial meniscus:  Intact.  Lateral meniscus:  Intact.  Complete discoid  configuration.  LIGAMENTS  Cruciates:  Intact.  Collaterals:  Intact.  CARTILAGE  Patellofemoral:  Mildly degenerated.  Medial:  Unremarkable.  Lateral:  Unremarkable.  Joint:  Trace amount of joint fluid.  Popliteal Fossa:  No Baker's cyst.  Extensor Mechanism:  Intact.  Bones:  No fracture or worrisome marrow lesion  Other: Fluid is seen anterior to the patella.  IMPRESSION: Negative for meniscal or ligament tear. Complete discoid lateral meniscus is noted.  Prepatellar fluid consistent with bursitis.  Mild patellofemoral degenerative change.   Electronically Signed   By: Inge Rise M.D.   On: 09/23/2015 09:17    Knee-L DG 4 views:  Results for orders placed during the hospital encounter of 11/26/16  DG Knee Complete 4 Views Left   Narrative CLINICAL DATA:  Left knee pain after a fall 2 weeks ago. No improvement with over the counter medications.  EXAM: LEFT KNEE - COMPLETE 4+ VIEW  COMPARISON:  Left knee 07/19/2015.  MRI left knee 09/23/2015.  FINDINGS: No evidence of fracture, dislocation, or joint effusion. No evidence of arthropathy or other focal bone abnormality. Soft tissues are unremarkable.  IMPRESSION: Negative.   Electronically Signed   By: Lucienne Capers M.D.   On: 11/26/2016 22:30    Ankle Imaging: Ankle-L DG Complete:  Results for orders placed during the hospital encounter of 08/12/16  DG Ankle Complete Left   Narrative CLINICAL DATA:  47 year old female status post twisting injury 3 days ago with continued pain bruising and swelling. Unable to weightbear.  EXAM: LEFT ANKLE COMPLETE - 3+ VIEW  COMPARISON:  Left ankle series 6617.  FINDINGS: Calcaneus appears stable and intact, with dorsal degenerative spurring. Mortise joint alignment remains normal. No ankle joint effusion is evident. The talar dome appears intact. Distal tibia and fibula appear stable and intact. Small excess Re ossicles at the plantar  midfoot are probably adjacent to the cuboid and appears stable. No acute fracture identified.  IMPRESSION: No acute fracture or dislocation identified about the left ankle.   Electronically Signed   By: Genevie Ann M.D.   On: 08/12/2016 17:36    Complexity Note: Imaging results reviewed. Results shared with [name], using [employer].                         Meds   Current Outpatient Medications:  .  acetaminophen (TYLENOL) 325 MG tablet, Take 1 tablet (325 mg total) by mouth every 6 (six) hours as needed for mild pain (or Fever >/= 101)., Disp: , Rfl:  .  albuterol (PROVENTIL HFA;VENTOLIN HFA) 108 (90 BASE) MCG/ACT inhaler, Inhale 2 puffs into the lungs every 4 (four) hours as needed for wheezing or shortness of breath., Disp: , Rfl:  .  aspirin EC 81 MG tablet, Take 81 mg by mouth daily at 12 noon., Disp: , Rfl:  .  atorvastatin (LIPITOR) 40 MG tablet, Take 40 mg by mouth at bedtime. , Disp: , Rfl:  .  bumetanide (BUMEX) 2 MG tablet, Take 1 tablet (2 mg total) by mouth 2 (two) times daily., Disp: 60 tablet, Rfl: 11 .  [facility] NICOTINE POLACRILEX 4 MG lozenge, , Disp: , Rfl:  .  gabapentin (NEURONTIN) 600 MG tablet, Take 1,200 mg by mouth 3 (three) times daily. , Disp: , Rfl:  .  hydrOXYzine (ATARAX/VISTARIL) 10 MG tablet, Take 1-3 tablets (10-30 mg total) by mouth 2 (two) times daily as needed (for severe anxiety sx)., Disp: 300 tablet, Rfl: 0 .  KLOR-CON M20 20 MEQ tablet, Take 20 mEq by mouth daily. , Disp: , Rfl:  .  lamoTRIgine (LAMICTAL) 25 MG tablet, Take 1-2 tablets (25-50 mg total) by mouth daily. Take 25 mg for 2 weeks and increase to 50 mg, Disp: 180 tablet, Rfl: 0 .  levocetirizine (XYZAL) 5 MG tablet, Take 5 mg by mouth every evening. , Disp: , Rfl:  .  levothyroxine (SYNTHROID, LEVOTHROID) 200 MCG tablet, Take 200 mcg by mouth daily. , Disp: , Rfl:  .  levothyroxine (SYNTHROID, LEVOTHROID) 25 MCG tablet, Take 25 mcg by mouth daily before breakfast. , Disp: , Rfl:  .   metFORMIN (GLUCOPHAGE) 500 MG tablet, , Disp: , Rfl:  .  montelukast (SINGULAIR) 10 MG tablet, Take 10 mg by mouth daily. , Disp: , Rfl:  .  omega-3 fish oil (MAXEPA) 1000 MG CAPS capsule, , Disp: , Rfl:  .  promethazine (PHENERGAN) 25 MG tablet, Take 1 tablet (25 mg total) by mouth every 6 (six) hours as needed for nausea or vomiting., Disp: 20 tablet, Rfl: 0 .  QUEtiapine (SEROQUEL) 25 MG tablet, Take 3 tablets (75 mg total) by mouth at bedtime., Disp: 270 tablet, Rfl: 0 .  TRELEGY ELLIPTA 100-62.5-25 MCG/INH AEPB, , Disp: , Rfl:  .  azelastine (ASTELIN) 0.1 % nasal spray, Place 1 spray into both nostrils daily., Disp: , Rfl:  .  azithromycin (ZITHROMAX) 250 MG tablet, Take 1 tablet (250 mg total) by mouth daily., Disp: 4 each, Rfl: 0 .  busPIRone (BUSPAR) 10 MG tablet, Take 2 tablets (20 mg total) by mouth 3 (three) times daily., Disp: 540 tablet, Rfl: 1 .  glipiZIDE (GLUCOTROL XL) 10 MG 24 hr tablet, Take 10 mg by mouth. , Disp: , Rfl:  .  hydrOXYzine (VISTARIL) 25 MG capsule, Take 1 capsule (25 mg total) by mouth 3 (three) times daily as needed for anxiety., Disp: 270 capsule, Rfl: 1 .  methocarbamol (ROBAXIN) 750 MG tablet, Take 1 tablet (750 mg total) by mouth every 8 (eight) hours as needed for muscle spasms., Disp: 90 tablet, Rfl: 0 .  traMADol (ULTRAM) 50 MG tablet, Take 1 tablet (50 mg total) by mouth every 6 (six) hours as needed for severe pain., Disp: 120 tablet, Rfl: 0  ROS  Constitutional: Denies any fever or chills Gastrointestinal: No reported hemesis, hematochezia, vomiting, or acute GI distress Musculoskeletal: Denies any acute onset joint swelling, redness, loss of ROM, or weakness Neurological: No reported episodes of acute onset apraxia, aphasia, dysarthria, agnosia, amnesia, paralysis, loss of coordination, or loss of consciousness  Allergies  Ms. Venuti is allergic to diazepam; levofloxacin; metronidazole; pregabalin; sulfa antibiotics; cephalexin; ciprofloxacin;  vancomycin; ziprasidone hcl; and penicillins.  PFSH  Drug: Ms. Edmunds  reports that she does not use drugs. Alcohol:  reports that she does not drink alcohol. Tobacco:  reports that she has been smoking cigarettes.  She has been smoking about 1.00 pack per day. She has never used smokeless tobacco. Medical:  has a past medical history of Anemia, Anxiety, Asthma, Bipolar disorder ([place]), CAD (coronary artery disease) ([place]), CHF (congestive heart failure) ([place]), COPD (chronic obstructive pulmonary disease) ([place]), Depression ([place]), Diabetes mellitus without complication ([place]), Diabetes mellitus, type II ([place]), Drug overdose, GERD (gastroesophageal reflux disease), Headache, Hyperlipidemia, Hypertension, Left leg pain (04/29/2014), MI (myocardial infarction) ([place]), Muscle ache (09/16/2014), Osteoporosis, Overactive bladder, Pancreatitis ([place]), Reflex sympathetic dystrophy, Renal insufficiency, Sleep apnea, Sleep apnea, Stroke ([place]), Thyroid disease, TIA (transient ischemic attack) ([place]), and TIA (transient ischemic attack). Surgical: Ms. Schneeberger  has a past surgical history that includes prolapse rectum surgery (N/A, July 2016); Abdominal hysterectomy; Tonsillectomy; and Hernia repair (07/15/2017). Family: family history includes Anxiety disorder in her father and mother; Bipolar disorder in her father and mother; CAD in her mother; Depression in her father and mother; Diabetes Mellitus II in her mother; Hypertension in her father; Osteoarthritis in her mother; Osteoporosis in her mother; Post-traumatic stress disorder in her sister; Sleep apnea in her mother.  Constitutional Exam  General appearance: Well nourished, well developed, and well hydrated. In no apparent acute distress Vitals:   10/02/17 1329  BP: (!) 145/83  Pulse: (!) 125  Resp: 18  Temp: 98.6 F (37 C)  SpO2: 96%  Weight: 225 lb (102.1 kg)  Height: '5\' 4"'$  (1.626 m)   BMI Assessment: Estimated body mass index is 38.62 kg/m as calculated from  the following:   Height as of this encounter: '5\' 4"'$  (1.626 m).   Weight as of this encounter: 225 lb (102.1 kg).  BMI interpretation table: BMI level Category Range association with higher incidence of chronic pain  <18 kg/m2 Underweight   18.5-24.9 kg/m2 Ideal body weight   25-29.9 kg/m2 Overweight Increased incidence by 20%  30-34.9 kg/m2 Obese (Class I) Increased incidence by 68%  35-39.9 kg/m2 Severe obesity (Class II) Increased incidence by 136%  >40 kg/m2 Extreme obesity (Class III) Increased incidence by 254%   Patient's current BMI Ideal Body weight  Body mass index is 38.62 kg/m. Ideal body weight: 54.7 kg (120 lb 9.5 oz) Adjusted ideal body weight: 73.6 kg (162 lb 5.7 oz)   BMI Readings from Last 4 Encounters:  10/02/17 38.62 kg/m  09/27/17 38.62 kg/m  09/09/17 40.34 kg/m  08/10/17 38.62 kg/m   Wt Readings from Last 4 Encounters:  10/02/17 225 lb (102.1 kg)  09/27/17 225 lb (102.1 kg)  09/09/17 235 lb (106.6 kg)  08/10/17 225 lb (102.1 kg)  Psych/Mental status: Alert, oriented x 3 (person, place, & time)       Eyes: PERLA Respiratory: No evidence of acute respiratory distress  Cervical Spine Area Exam  Skin & Axial Inspection: No masses, redness, edema, swelling, or associated skin lesions Alignment: Symmetrical Functional ROM: Unrestricted ROM      Stability: No instability detected Muscle Tone/Strength: Functionally intact. No obvious neuro-muscular anomalies detected. Sensory (Neurological): Unimpaired Palpation: No palpable anomalies              Upper Extremity (UE) Exam    Side: Right upper extremity  Side: Left upper extremity  Skin & Extremity Inspection: Skin color, temperature, and hair growth are WNL. No peripheral edema or cyanosis. No masses, redness, swelling, asymmetry, or associated skin lesions. No contractures.  Skin & Extremity Inspection: Skin color, temperature, and hair growth are WNL. No peripheral edema or cyanosis. No masses, redness,  swelling, asymmetry, or associated skin lesions. No  contractures.  Functional ROM: Unrestricted ROM          Functional ROM: Unrestricted ROM          Muscle Tone/Strength: Functionally intact. No obvious neuro-muscular anomalies detected.  Muscle Tone/Strength: Functionally intact. No obvious neuro-muscular anomalies detected.  Sensory (Neurological): Unimpaired          Sensory (Neurological): Unimpaired          Palpation: No palpable anomalies              Palpation: No palpable anomalies              Provocative Test(s):  Phalen's test: deferred Tinel's test: deferred Apley's scratch test (touch opposite shoulder):  Action 1 (Across chest): deferred Action 2 (Overhead): deferred Action 3 (LB reach): deferred   Provocative Test(s):  Phalen's test: deferred Tinel's test: deferred Apley's scratch test (touch opposite shoulder):  Action 1 (Across chest): deferred Action 2 (Overhead): deferred Action 3 (LB reach): deferred    Thoracic Spine Area Exam  Skin & Axial Inspection: No masses, redness, or swelling Alignment: Symmetrical Functional ROM: Unrestricted ROM Stability: No instability detected Muscle Tone/Strength: Functionally intact. No obvious neuro-muscular anomalies detected. Sensory (Neurological): Unimpaired Muscle strength & Tone: No palpable anomalies  Lumbar Spine Area Exam  Skin & Axial Inspection: No masses, redness, or swelling Alignment: Symmetrical Functional ROM: Decreased ROM       Stability: No instability detected Muscle Tone/Strength: Increased muscle tone over affected area Sensory (Neurological): Movement-associated pain Palpation: Complains of area being tender to palpation       Provocative Tests: Hyperextension/rotation test: (+) bilaterally for facet joint pain. Lumbar quadrant test (Kemp's test): (+) bilaterally for facet joint pain. Lateral bending test: deferred today       Patrick's Maneuver: deferred today                   FABER test:  deferred today                   S-I anterior distraction/compression test: deferred today         S-I lateral compression test: deferred today         S-I Thigh-thrust test: deferred today         S-I Gaenslen's test: deferred today          Gait & Posture Assessment  Ambulation: Unassisted Gait: Relatively normal for age and body habitus Posture: WNL   Lower Extremity Exam    Side: Right lower extremity  Side: Left lower extremity  Stability: No instability observed          Stability: No instability observed          Skin & Extremity Inspection: Skin color, temperature, and hair growth are WNL. No peripheral edema or cyanosis. No masses, redness, swelling, asymmetry, or associated skin lesions. No contractures.  Skin & Extremity Inspection: Skin color, temperature, and hair growth are WNL. No peripheral edema or cyanosis. No masses, redness, swelling, asymmetry, or associated skin lesions. No contractures.  Functional ROM: Unrestricted ROM                  Functional ROM: Unrestricted ROM                  Muscle Tone/Strength: Functionally intact. No obvious neuro-muscular anomalies detected.  Muscle Tone/Strength: Functionally intact. No obvious neuro-muscular anomalies detected.  Sensory (Neurological): Unimpaired  Sensory (Neurological): Unimpaired  Palpation: No palpable anomalies  Palpation: No palpable  anomalies   Assessment & Plan  Primary Diagnosis & Pertinent Problem List: The primary encounter diagnosis was Chronic pain syndrome. Diagnoses of Chronic low back pain (Primary Area of Pain) (Bilateral) (L>R), Lumbar facet syndrome (Bilateral) (L>R), Lumbar facet hypertrophy, Spondylosis without myelopathy or radiculopathy, lumbosacral region, Chronic lower extremity pain (Secondary Area of Pain) (Left), Lumbar foraminal stenosis (Bilateral) (L5-S1), Chronic ankle pain (Tertiary Area of Pain) (Left), Ankle joint instability (Left), Ankle sprain, sequela (Left), CRPS (complex regional  pain syndrome) type 1 of lower limb (left ankle), Chronic neck pain (Fourth Area of Pain) (Bilateral) (L>R), Chronic shoulder pain (Fifth Area of Pain) (Bilateral) (L>R), Chronic musculoskeletal pain, Neuropathic pain, Abnormal CT scan, lumbar spine, Elevated C-reactive protein (CRP), Elevated sed rate, Disorder of skeletal system, Pharmacologic therapy, Polypharmacy, History of psychiatric symptoms, and History of suicide attempt were also pertinent to this visit.  Visit Diagnosis: 1. Chronic pain syndrome   2. Chronic low back pain (Primary Area of Pain) (Bilateral) (L>R)   3. Lumbar facet syndrome (Bilateral) (L>R)   4. Lumbar facet hypertrophy   5. Spondylosis without myelopathy or radiculopathy, lumbosacral region   6. Chronic lower extremity pain (Secondary Area of Pain) (Left)   7. Lumbar foraminal stenosis (Bilateral) (L5-S1)   8. Chronic ankle pain [facility] Area of Pain) (Left)   9. Ankle joint instability (Left)   10. Ankle sprain, sequela (Left)   11. CRPS (complex regional pain syndrome) type 1 of lower limb (left ankle)   12. Chronic neck pain (Fourth Area of Pain) (Bilateral) (L>R)   13. Chronic shoulder pain (Fifth Area of Pain) (Bilateral) (L>R)   14. Chronic musculoskeletal pain   15. Neuropathic pain   16. Abnormal CT scan, lumbar spine   17. Elevated C-reactive protein (CRP)   18. Elevated sed rate   19. Disorder of skeletal system   20. Pharmacologic therapy   21. Polypharmacy   22. History of psychiatric symptoms   23. History of suicide attempt    Problems updated and reviewed during this visit: Problem  Chronic shoulder pain (Fifth Area of Pain) (Bilateral) (L>R)  CRPS (complex regional pain syndrome) type 1 of lower limb (left ankle)  Ankle joint instability (Left)  Ankle sprain, sequela (Left)  Chronic neck pain (Fourth Area of Pain) (Bilateral) (L>R)  Contusion of knee (Left)  Strain of knee (Left)  Chronic lower extremity pain (Secondary Area of Pain)  (Left)   Left ankle CRPS    Chronic ankle pain (Tertiary Area of Pain) (Left)  History of Psychiatric Symptoms   Time Note: Greater than 50% of the 40 minute(s) of face-to-face time spent with Ms. [place], was spent in counseling/coordination of care regarding: the appropriate use of the pain scale, Ms. [place]'s primary cause of pain, the results of her recent test(s), the significance of each one oth the test(s) anomalies and it's corresponding characteristic pain pattern(s), the treatment plan, treatment alternatives, the risks and possible complications of proposed treatment, medication side effects, the opioid analgesic risks and possible complications, realistic expectations, the goals of pain management (increased in functionality), the medication agreement, the patient's responsibilities when it comes to controlled substances and the need to collect and read the AVS material.  Plan of Care  Pharmacotherapy (Medications Ordered): Meds ordered this encounter  Medications  . methocarbamol (ROBAXIN) 750 MG tablet    Sig: Take 1 tablet (750 mg total) by mouth every 8 (eight) hours as needed for muscle spasms.    Dispense:  90 tablet  Refill:  0    Do not place this medication, or any other prescription from our practice, on "Automatic Refill". Patient may have prescription filled one day early if pharmacy is closed on scheduled refill date.  . traMADol (ULTRAM) 50 MG tablet    Sig: Take 1 tablet (50 mg total) by mouth every 6 (six) hours as needed for severe pain.    Dispense:  120 tablet    Refill:  0    Medication for Chronic Pain (G89.4). [place] STOP ACT - Not applicable. Fill one day early if pharmacy is closed on scheduled refill date.  Do not fill until: 10/02/17 To last until: 11/01/17    Procedure Orders     LUMBAR FACET(MEDIAL BRANCH NERVE BLOCK) [facility] Lab Orders  No laboratory test(s) ordered today   Imaging Orders  No imaging studies ordered today   Referral Orders  No  referral(s) requested today   Pharmacological management options:  Opioid Analgesics: We'll take over management today. See above orders Membrane stabilizer: We have discussed the possibility of optimizing this mode of therapy, if tolerated Muscle relaxant: We have discussed the possibility of a trial NSAID: We have discussed the possibility of a trial Other analgesic(s): To be determined at a later time   Interventional management options: Planned, scheduled, and/or pending:    Diagnostic bilateral lumbar facet block #1 under fluoroscopic guidance and IV sedation    Considering:   Diagnostic bilateral lumbar facet nerve block  Possible bilateral lumbar facet RFA  Diagnostic left-sided L5-S1 transforaminal ESI  Diagnostic left-sided L5 selective nerve root block  Possible left-sided L5 DRG RFA  Diagnostic left-sided lumbar sympathetic nerve block  Possible left-sided lumbar sympathetic RFA  Diagnostic bilateral cervical facet nerve block  Possible bilateral cervical facet RFA  Diagnostic left intra-articular shoulder injection  Diagnostic left suprascapular nerve block  Diagnostic left suprascapular RFA  Possible lumbar spinal cord stimulator trial    PRN Procedures:   None at this time   Provider-requested follow-up: Return for Procedure (w/ sedation): (B) L-FCT BLK #1.  Future Appointments  Date Time Provider [place]  01/01/2018  8:30 AM Ursula Alert, MD [facility] None    Primary Care Physician: Sharyne Peach, MD Location: [facility] Note by: Gaspar Cola, MD Date: 10/02/2017; Time: 2:44 PM

## 2017-10-02 ENCOUNTER — Other Ambulatory Visit: Payer: Self-pay

## 2017-10-02 ENCOUNTER — Encounter: Payer: Self-pay | Admitting: Psychiatry

## 2017-10-02 ENCOUNTER — Ambulatory Visit: Payer: Medicare HMO | Attending: Pain Medicine | Admitting: Pain Medicine

## 2017-10-02 ENCOUNTER — Encounter: Payer: Self-pay | Admitting: Pain Medicine

## 2017-10-02 ENCOUNTER — Ambulatory Visit (INDEPENDENT_AMBULATORY_CARE_PROVIDER_SITE_OTHER): Payer: Medicare HMO | Admitting: Psychiatry

## 2017-10-02 VITALS — BP 145/83 | HR 125 | Temp 98.6°F | Resp 18 | Ht 64.0 in | Wt 225.0 lb

## 2017-10-02 VITALS — BP 138/89 | HR 111 | Temp 98.0°F | Wt 222.8 lb

## 2017-10-02 DIAGNOSIS — I130 Hypertensive heart and chronic kidney disease with heart failure and stage 1 through stage 4 chronic kidney disease, or unspecified chronic kidney disease: Secondary | ICD-10-CM | POA: Insufficient documentation

## 2017-10-02 DIAGNOSIS — I2510 Atherosclerotic heart disease of native coronary artery without angina pectoris: Secondary | ICD-10-CM | POA: Diagnosis not present

## 2017-10-02 DIAGNOSIS — Z6838 Body mass index (BMI) 38.0-38.9, adult: Secondary | ICD-10-CM | POA: Diagnosis not present

## 2017-10-02 DIAGNOSIS — M4807 Spinal stenosis, lumbosacral region: Secondary | ICD-10-CM | POA: Insufficient documentation

## 2017-10-02 DIAGNOSIS — Z9151 Personal history of suicidal behavior: Secondary | ICD-10-CM

## 2017-10-02 DIAGNOSIS — T8130XA Disruption of wound, unspecified, initial encounter: Secondary | ICD-10-CM | POA: Diagnosis not present

## 2017-10-02 DIAGNOSIS — M25372 Other instability, left ankle: Secondary | ICD-10-CM | POA: Insufficient documentation

## 2017-10-02 DIAGNOSIS — R937 Abnormal findings on diagnostic imaging of other parts of musculoskeletal system: Secondary | ICD-10-CM

## 2017-10-02 DIAGNOSIS — Z7984 Long term (current) use of oral hypoglycemic drugs: Secondary | ICD-10-CM | POA: Insufficient documentation

## 2017-10-02 DIAGNOSIS — M545 Low back pain, unspecified: Secondary | ICD-10-CM

## 2017-10-02 DIAGNOSIS — Z7982 Long term (current) use of aspirin: Secondary | ICD-10-CM | POA: Insufficient documentation

## 2017-10-02 DIAGNOSIS — G9050 Complex regional pain syndrome I, unspecified: Secondary | ICD-10-CM | POA: Insufficient documentation

## 2017-10-02 DIAGNOSIS — I509 Heart failure, unspecified: Secondary | ICD-10-CM | POA: Diagnosis not present

## 2017-10-02 DIAGNOSIS — Z8659 Personal history of other mental and behavioral disorders: Secondary | ICD-10-CM

## 2017-10-02 DIAGNOSIS — R7982 Elevated C-reactive protein (CRP): Secondary | ICD-10-CM | POA: Insufficient documentation

## 2017-10-02 DIAGNOSIS — Z833 Family history of diabetes mellitus: Secondary | ICD-10-CM | POA: Insufficient documentation

## 2017-10-02 DIAGNOSIS — R948 Abnormal results of function studies of other organs and systems: Secondary | ICD-10-CM | POA: Insufficient documentation

## 2017-10-02 DIAGNOSIS — K439 Ventral hernia without obstruction or gangrene: Secondary | ICD-10-CM | POA: Insufficient documentation

## 2017-10-02 DIAGNOSIS — R700 Elevated erythrocyte sedimentation rate: Secondary | ICD-10-CM

## 2017-10-02 DIAGNOSIS — G459 Transient cerebral ischemic attack, unspecified: Secondary | ICD-10-CM | POA: Diagnosis not present

## 2017-10-02 DIAGNOSIS — E039 Hypothyroidism, unspecified: Secondary | ICD-10-CM | POA: Insufficient documentation

## 2017-10-02 DIAGNOSIS — S66311A Strain of extensor muscle, fascia and tendon of left index finger at wrist and hand level, initial encounter: Secondary | ICD-10-CM | POA: Insufficient documentation

## 2017-10-02 DIAGNOSIS — Z79899 Other long term (current) drug therapy: Secondary | ICD-10-CM | POA: Insufficient documentation

## 2017-10-02 DIAGNOSIS — M792 Neuralgia and neuritis, unspecified: Secondary | ICD-10-CM

## 2017-10-02 DIAGNOSIS — M79605 Pain in left leg: Secondary | ICD-10-CM | POA: Insufficient documentation

## 2017-10-02 DIAGNOSIS — J45909 Unspecified asthma, uncomplicated: Secondary | ICD-10-CM | POA: Insufficient documentation

## 2017-10-02 DIAGNOSIS — N3644 Muscular disorders of urethra: Secondary | ICD-10-CM | POA: Diagnosis not present

## 2017-10-02 DIAGNOSIS — G894 Chronic pain syndrome: Secondary | ICD-10-CM | POA: Insufficient documentation

## 2017-10-02 DIAGNOSIS — M9983 Other biomechanical lesions of lumbar region: Secondary | ICD-10-CM

## 2017-10-02 DIAGNOSIS — G4730 Sleep apnea, unspecified: Secondary | ICD-10-CM | POA: Diagnosis not present

## 2017-10-02 DIAGNOSIS — K625 Hemorrhage of anus and rectum: Secondary | ICD-10-CM | POA: Diagnosis not present

## 2017-10-02 DIAGNOSIS — S93402S Sprain of unspecified ligament of left ankle, sequela: Secondary | ICD-10-CM

## 2017-10-02 DIAGNOSIS — L02211 Cutaneous abscess of abdominal wall: Secondary | ICD-10-CM | POA: Insufficient documentation

## 2017-10-02 DIAGNOSIS — M48061 Spinal stenosis, lumbar region without neurogenic claudication: Secondary | ICD-10-CM

## 2017-10-02 DIAGNOSIS — F3160 Bipolar disorder, current episode mixed, unspecified: Secondary | ICD-10-CM

## 2017-10-02 DIAGNOSIS — M47816 Spondylosis without myelopathy or radiculopathy, lumbar region: Secondary | ICD-10-CM | POA: Insufficient documentation

## 2017-10-02 DIAGNOSIS — N3281 Overactive bladder: Secondary | ICD-10-CM | POA: Insufficient documentation

## 2017-10-02 DIAGNOSIS — R6889 Other general symptoms and signs: Secondary | ICD-10-CM | POA: Diagnosis not present

## 2017-10-02 DIAGNOSIS — M899 Disorder of bone, unspecified: Secondary | ICD-10-CM

## 2017-10-02 DIAGNOSIS — F17200 Nicotine dependence, unspecified, uncomplicated: Secondary | ICD-10-CM

## 2017-10-02 DIAGNOSIS — I252 Old myocardial infarction: Secondary | ICD-10-CM | POA: Insufficient documentation

## 2017-10-02 DIAGNOSIS — Z915 Personal history of self-harm: Secondary | ICD-10-CM | POA: Insufficient documentation

## 2017-10-02 DIAGNOSIS — A419 Sepsis, unspecified organism: Secondary | ICD-10-CM | POA: Insufficient documentation

## 2017-10-02 DIAGNOSIS — F319 Bipolar disorder, unspecified: Secondary | ICD-10-CM | POA: Insufficient documentation

## 2017-10-02 DIAGNOSIS — M7918 Myalgia, other site: Secondary | ICD-10-CM | POA: Insufficient documentation

## 2017-10-02 DIAGNOSIS — Z720 Tobacco use: Secondary | ICD-10-CM | POA: Insufficient documentation

## 2017-10-02 DIAGNOSIS — N183 Chronic kidney disease, stage 3 (moderate): Secondary | ICD-10-CM | POA: Insufficient documentation

## 2017-10-02 DIAGNOSIS — Z09 Encounter for follow-up examination after completed treatment for conditions other than malignant neoplasm: Secondary | ICD-10-CM | POA: Insufficient documentation

## 2017-10-02 DIAGNOSIS — Z9889 Other specified postprocedural states: Secondary | ICD-10-CM | POA: Insufficient documentation

## 2017-10-02 DIAGNOSIS — G90522 Complex regional pain syndrome I of left lower limb: Secondary | ICD-10-CM | POA: Insufficient documentation

## 2017-10-02 DIAGNOSIS — M542 Cervicalgia: Secondary | ICD-10-CM

## 2017-10-02 DIAGNOSIS — M25511 Pain in right shoulder: Secondary | ICD-10-CM

## 2017-10-02 DIAGNOSIS — Z8673 Personal history of transient ischemic attack (TIA), and cerebral infarction without residual deficits: Secondary | ICD-10-CM | POA: Insufficient documentation

## 2017-10-02 DIAGNOSIS — Z8249 Family history of ischemic heart disease and other diseases of the circulatory system: Secondary | ICD-10-CM | POA: Insufficient documentation

## 2017-10-02 DIAGNOSIS — E785 Hyperlipidemia, unspecified: Secondary | ICD-10-CM | POA: Insufficient documentation

## 2017-10-02 DIAGNOSIS — F4310 Post-traumatic stress disorder, unspecified: Secondary | ICD-10-CM

## 2017-10-02 DIAGNOSIS — M25572 Pain in left ankle and joints of left foot: Secondary | ICD-10-CM | POA: Diagnosis not present

## 2017-10-02 DIAGNOSIS — Z79890 Hormone replacement therapy (postmenopausal): Secondary | ICD-10-CM | POA: Insufficient documentation

## 2017-10-02 DIAGNOSIS — E119 Type 2 diabetes mellitus without complications: Secondary | ICD-10-CM | POA: Insufficient documentation

## 2017-10-02 DIAGNOSIS — M47817 Spondylosis without myelopathy or radiculopathy, lumbosacral region: Secondary | ICD-10-CM | POA: Diagnosis not present

## 2017-10-02 DIAGNOSIS — K623 Rectal prolapse: Secondary | ICD-10-CM | POA: Insufficient documentation

## 2017-10-02 DIAGNOSIS — F419 Anxiety disorder, unspecified: Secondary | ICD-10-CM | POA: Insufficient documentation

## 2017-10-02 DIAGNOSIS — Z79891 Long term (current) use of opiate analgesic: Secondary | ICD-10-CM | POA: Insufficient documentation

## 2017-10-02 DIAGNOSIS — Z818 Family history of other mental and behavioral disorders: Secondary | ICD-10-CM | POA: Insufficient documentation

## 2017-10-02 DIAGNOSIS — G8929 Other chronic pain: Secondary | ICD-10-CM

## 2017-10-02 DIAGNOSIS — Z8262 Family history of osteoporosis: Secondary | ICD-10-CM | POA: Insufficient documentation

## 2017-10-02 DIAGNOSIS — M25512 Pain in left shoulder: Secondary | ICD-10-CM | POA: Insufficient documentation

## 2017-10-02 DIAGNOSIS — S8002XA Contusion of left knee, initial encounter: Secondary | ICD-10-CM | POA: Insufficient documentation

## 2017-10-02 MED ORDER — BUSPIRONE HCL 10 MG PO TABS: 20.0000 mg | ORAL_TABLET | Freq: Three times a day (TID) | ORAL | 1 refills | Status: DC

## 2017-10-02 MED ORDER — HYDROXYZINE PAMOATE 25 MG PO CAPS: 25.0000 mg | ORAL_CAPSULE | Freq: Three times a day (TID) | ORAL | 1 refills | Status: DC | PRN

## 2017-10-02 MED ORDER — METHOCARBAMOL 750 MG PO TABS: 750.0000 mg | ORAL_TABLET | Freq: Three times a day (TID) | ORAL | 0 refills | Status: DC | PRN

## 2017-10-02 MED ORDER — TRAMADOL HCL 50 MG PO TABS: 50.0000 mg | ORAL_TABLET | Freq: Four times a day (QID) | ORAL | 0 refills | Status: DC | PRN

## 2017-10-02 NOTE — Progress Notes (Signed)
Safety precautions to be maintained throughout the outpatient stay will include: orient to surroundings, keep bed in low position, maintain call bell within reach at all times, provide assistance with transfer out of bed and ambulation.  

## 2017-10-02 NOTE — Progress Notes (Signed)
[facility] MD OP Progress Note  10/02/2017 4:22 PM Marie Clarke  MRN:  [id number]  Chief Complaint: ' I am here for follow up." Chief Complaint    Follow-up; Medication Refill     HPI: Marie Clarke is a 47 year old Caucasian female, single, on SSD, lives in [place], has a history of bipolar disorder, COPD, OSA on CPAP, coronary artery disease, hyperlipidemia, hypertension, hypothyroidism, chronic back pain, recent abdominal surgery, presented to the clinic today for a follow-up visit.  Pt today reports she has been having some anxiety symptoms as well as some forgetfulness.  She reports when she forgets something she has noticed that she panic's and this has been getting worse since the past month or so.  Patient reports she otherwise does not have any trouble with cooking or getting lost while driving or difficulty remembering people or places and so on.  Completed an MMSE on patient today.  Also reviewed labs like vitamin B12 in [facility]  Patient reports she continues to be compliant with her medications as prescribed.  Patient reports she sleeps okay on the Seroquel.  She denies any suicidality.  Patient denies any perceptual disturbances.     Visit Diagnosis:    ICD-10-CM   1. Bipolar I disorder, most recent episode mixed (HCC) F31.60 hydrOXYzine (VISTARIL) 25 MG capsule  2. Tobacco use disorder F17.200   3. PTSD (post-traumatic stress disorder) F43.10 hydrOXYzine (VISTARIL) 25 MG capsule    Past Psychiatric History: Reviewed past psychiatric history from my progress note on 03/06/2017  Past Medical History:  Past Medical History:  Diagnosis Date  . Anemia   . Anxiety   . Asthma   . Bipolar disorder ([place])   . CAD (coronary artery disease) unk  . CHF (congestive heart failure) ([place])   . COPD (chronic obstructive pulmonary disease) ([place])   . Depression unk  . Diabetes mellitus without complication ([place])   . Diabetes mellitus, type II ([place])   . Drug overdose   . GERD (gastroesophageal reflux  disease)   . Headache   . Hyperlipidemia   . Hypertension   . Left leg pain 04/29/2014  . MI (myocardial infarction) ([place])   . Muscle ache 09/16/2014  . Osteoporosis   . Overactive bladder   . Pancreatitis unk  . Reflex sympathetic dystrophy   . Renal insufficiency   . Sleep apnea    pt reported on 2/6/7 she currently is not using CPAP  . Sleep apnea   . Stroke ([place])   . Thyroid disease   . TIA (transient ischemic attack) unk  . TIA (transient ischemic attack)     Past Surgical History:  Procedure Laterality Date  . ABDOMINAL HYSTERECTOMY    . HERNIA REPAIR  07/15/2017  . prolapse rectum surgery N/A July 2016  . TONSILLECTOMY      Family Psychiatric History: Reviewed family psychiatric history from my progress note on 03/06/2017  Family History:  Family History  Problem Relation Age of Onset  . Diabetes Mellitus II Mother   . CAD Mother   . Sleep apnea Mother   . Osteoarthritis Mother   . Osteoporosis Mother   . Anxiety disorder Mother   . Depression Mother   . Bipolar disorder Mother   . Bipolar disorder Father   . Hypertension Father   . Depression Father   . Anxiety disorder Father   . Post-traumatic stress disorder Sister     Social History: Reviewed social history from my progress note on 03/06/2017 Social History  Socioeconomic History  . Marital status: Single    Spouse name: Not on file  . Number of children: 0  . Years of education: Not on file  . Highest education level: High school graduate  Occupational History    Comment: not employed  Social Needs  . Financial resource strain: Not hard at all  . Food insecurity:    Worry: Never true    Inability: Never true  . Transportation needs:    Medical: Yes    Non-medical: Yes  Tobacco Use  . Smoking status: Current Every Day Smoker    Packs/day: 1.00    Types: Cigarettes    Last attempt to quit: 06/12/2016    Years since quitting: 1.3  . Smokeless tobacco: Never Used  Substance and Sexual  Activity  . Alcohol use: No    Alcohol/week: 0.0 standard drinks  . Drug use: No  . Sexual activity: Not Currently  Lifestyle  . Physical activity:    Days per week: 0 days    Minutes per session: 0 min  . Stress: Not at all  Relationships  . Social connections:    Talks on phone: More than three times a week    Gets together: More than three times a week    Attends religious service: More than 4 times per year    Active member of club or organization: No    Attends meetings of clubs or organizations: Never    Relationship status: Never married  Other Topics Concern  . Not on file  Social History Narrative  . Not on file    Allergies:  Allergies  Allergen Reactions  . Diazepam Hives and Nausea And Vomiting  . Levofloxacin Hives and Rash    1/31: would like to retry since not taking valium  . Metronidazole Hives    1/31: would like to retry since she is no longer taking valium  . Pregabalin Hives and Rash    1/31 currently taking lyrica without reaction since no longer taking valium  . Sulfa Antibiotics Hives  . Cephalexin Hives  . Ciprofloxacin Hives  . Vancomycin Hives  . Ziprasidone Hcl Other (See Comments)    CONFUSED CONFUSED Other reaction(s): Other (See Comments) CONFUSED CONFUSED   . Penicillins Nausea And Vomiting    Has patient had a PCN reaction causing immediate rash, facial/tongue/throat swelling, SOB or lightheadedness with hypotension: No Has patient had a PCN reaction causing severe rash involving mucus membranes or skin necrosis: No Has patient had a PCN reaction that required hospitalization: No Has patient had a PCN reaction occurring within the last 10 years: No If all of the above answers are "NO", then may proceed with Cephalosporin use.     Metabolic Disorder Labs: No results found for: HGBA1C, MPG No results found for: PROLACTIN No results found for: CHOL, TRIG, HDL, CHOLHDL, VLDL, LDLCALC No results found for: TSH  Therapeutic Level  Labs: No results found for: LITHIUM No results found for: VALPROATE No components found for:  CBMZ  Current Medications: Current Outpatient Medications  Medication Sig Dispense Refill  . acetaminophen (TYLENOL) 325 MG tablet Take 1 tablet (325 mg total) by mouth every 6 (six) hours as needed for mild pain (or Fever >/= 101).    Marland Kitchen albuterol (PROVENTIL HFA;VENTOLIN HFA) 108 (90 BASE) MCG/ACT inhaler Inhale 2 puffs into the lungs every 4 (four) hours as needed for wheezing or shortness of breath.    Marland Kitchen aspirin EC 81 MG tablet Take 81 mg by  mouth daily at 12 noon.    Marland Kitchen atorvastatin (LIPITOR) 40 MG tablet Take 40 mg by mouth at bedtime.     Marland Kitchen azelastine (ASTELIN) 0.1 % nasal spray Place 1 spray into both nostrils daily.    Marland Kitchen azithromycin (ZITHROMAX) 250 MG tablet Take 1 tablet (250 mg total) by mouth daily. 4 each 0  . bumetanide (BUMEX) 2 MG tablet Take 1 tablet (2 mg total) by mouth 2 (two) times daily. 60 tablet 11  . [facility] NICOTINE POLACRILEX 4 MG lozenge     . gabapentin (NEURONTIN) 600 MG tablet Take 1,200 mg by mouth 3 (three) times daily.     . hydrOXYzine (ATARAX/VISTARIL) 10 MG tablet Take 1-3 tablets (10-30 mg total) by mouth 2 (two) times daily as needed (for severe anxiety sx). 300 tablet 0  . KLOR-CON M20 20 MEQ tablet Take 20 mEq by mouth daily.     Marland Kitchen lamoTRIgine (LAMICTAL) 25 MG tablet Take 1-2 tablets (25-50 mg total) by mouth daily. Take 25 mg for 2 weeks and increase to 50 mg 180 tablet 0  . levocetirizine (XYZAL) 5 MG tablet Take 5 mg by mouth every evening.     Marland Kitchen levothyroxine (SYNTHROID, LEVOTHROID) 200 MCG tablet Take 200 mcg by mouth daily.     Marland Kitchen levothyroxine (SYNTHROID, LEVOTHROID) 25 MCG tablet Take 25 mcg by mouth daily before breakfast.     . metFORMIN (GLUCOPHAGE) 500 MG tablet     . methocarbamol (ROBAXIN) 750 MG tablet Take 1 tablet (750 mg total) by mouth every 8 (eight) hours as needed for muscle spasms. 90 tablet 0  . montelukast (SINGULAIR) 10 MG tablet Take 10 mg  by mouth daily.     Marland Kitchen omega-3 fish oil (MAXEPA) 1000 MG CAPS capsule     . promethazine (PHENERGAN) 25 MG tablet Take 1 tablet (25 mg total) by mouth every 6 (six) hours as needed for nausea or vomiting. 20 tablet 0  . QUEtiapine (SEROQUEL) 25 MG tablet Take 3 tablets (75 mg total) by mouth at bedtime. 270 tablet 0  . traMADol (ULTRAM) 50 MG tablet Take 1 tablet (50 mg total) by mouth every 6 (six) hours as needed for severe pain. 120 tablet 0  . TRELEGY ELLIPTA 100-62.5-25 MCG/INH AEPB     . busPIRone (BUSPAR) 10 MG tablet Take 2 tablets (20 mg total) by mouth 3 (three) times daily. 540 tablet 1  . glipiZIDE (GLUCOTROL XL) 10 MG 24 hr tablet Take 10 mg by mouth.     . hydrOXYzine (VISTARIL) 25 MG capsule Take 1 capsule (25 mg total) by mouth 3 (three) times daily as needed for anxiety. 270 capsule 1   No current facility-administered medications for this visit.      Musculoskeletal: Strength & Muscle Tone: within normal limits Gait & Station: normal Patient leans: N/A  Psychiatric Specialty Exam: Review of Systems  Psychiatric/Behavioral: The patient is nervous/anxious.   All other systems reviewed and are negative.   Blood pressure 138/89, pulse (!) 111, temperature 98 F (36.7 C), temperature source Oral, weight 222 lb 12.8 oz (101.1 kg).Body mass index is 38.24 kg/m.  General Appearance: Casual  Eye Contact:  Fair  Speech:  Clear and Coherent  Volume:  Normal  Mood:  Anxious  Affect:  Appropriate  Thought Process:  Goal Directed and Descriptions of Associations: Intact  Orientation:  Full (Time, Place, and Person)  Thought Content: Logical   Suicidal Thoughts:  No  Homicidal Thoughts:  No  Memory:  Immediate;   Fair Recent;   Fair Remote;   Fair  Judgement:  Fair  Insight:  Fair  Psychomotor Activity:  Normal  Concentration:  Concentration: Fair and Attention Span: Fair  Recall:  [employer] of Knowledge: Fair  Language: Fair  Akathisia:  No  Handed:  Right  AIMS  (if indicated): 0  Assets:  Communication Skills Desire for Improvement Housing  ADL's:  Intact  Cognition: WNL  Sleep:  Fair   Screenings: PHQ2-9     Office Visit from 10/02/2017 in [place] Office Visit from 09/09/2017 in [place] Office Visit from 03/28/2015 in [facility]  PHQ-2 Total Score  0  0  0       Assessment and Plan: Marielouise is a 47 year old Caucasian female who has a history of bipolar disorder, multiple medical problems, presented to the clinic today for a follow-up visit.  Patient today reports some forgetfulness and anxiety symptoms.  Discussed medication regimen and plan as noted below.  Plan For bipolar disorder Lamictal 50 mg p.o. daily Increase BuSpar to 20 mg p.o. 3 times daily Seroquel 75 mg p.o. daily  For tobacco use disorder Provided smoking cessation counseling.  R/O Memory problems  MMSE on patient-30 out of 30 today. Her vitamin B12 done on 09/09/2017-within normal limits Patient has a history of hypothyroidism and she reports she has an upcoming appointment with her primary medical doctor for management.  Discussed with her that she may need to follow-up on her thyroid panel to make sure her thyroid under control. Discussed with patient that her memory problems likely is due to her anxiety symptoms or polypharmacy.  Patient will continue to monitor her symptoms closely.  Follow-up in clinic in 3 months or sooner if needed  More than 50 % of the time was spent for psychoeducation and supportive psychotherapy and care coordination.  This note was generated in part or whole with voice recognition software. Voice recognition is usually quite accurate but there are transcription errors that can and very often do occur. I apologize for any typographical errors that were not detected and corrected.         Ursula Alert, MD 10/02/2017, 4:22 PM

## 2017-10-02 NOTE — Patient Instructions (Addendum)
____________________________________________________________________________________________  Preparing for Procedure with Sedation  Instructions: . Oral Intake: Do not eat or drink anything for at least 8 hours prior to your procedure. . Transportation: Public transportation is not allowed. Bring an adult driver. The driver must be physically present in our waiting room before any procedure can be started. . Physical Assistance: Bring an adult physically capable of assisting you, in the event you need help. This adult should keep you company at home for at least 6 hours after the procedure. . Blood Pressure Medicine: Take your blood pressure medicine with a sip of water the morning of the procedure. . Blood thinners: Notify our staff if you are taking any blood thinners. Depending on which one you take, there will be specific instructions on how and when to stop it. . Diabetics on insulin: Notify the staff so that you can be scheduled 1st case in the morning. If your diabetes requires high dose insulin, take only  of your normal insulin dose the morning of the procedure and notify the staff that you have done so. . Preventing infections: Shower with an antibacterial soap the morning of your procedure. . Build-up your immune system: Take 1000 mg of Vitamin C with every meal (3 times a day) the day prior to your procedure. . Antibiotics: Inform the staff if you have a condition or reason that requires you to take antibiotics before dental procedures. . Pregnancy: If you are pregnant, call and cancel the procedure. . Sickness: If you have a cold, fever, or any active infections, call and cancel the procedure. . Arrival: You must be in the facility at least 30 minutes prior to your scheduled procedure. . Children: Do not bring children with you. . Dress appropriately: Bring dark clothing that you would not mind if they get stained. . Valuables: Do not bring any jewelry or valuables.  Procedure  appointments are reserved for interventional treatments only. . No Prescription Refills. . No medication changes will be discussed during procedure appointments. . No disability issues will be discussed.  Reasons to call and reschedule or cancel your procedure: (Following these recommendations will minimize the risk of a serious complication.) . Surgeries: Avoid having procedures within 2 weeks of any surgery. (Avoid for 2 weeks before or after any surgery). . Flu Shots: Avoid having procedures within 2 weeks of a flu shots or . (Avoid for 2 weeks before or after immunizations). . Barium: Avoid having a procedure within 7-10 days after having had a radiological study involving the use of radiological contrast. (Myelograms, Barium swallow or enema study). . Heart attacks: Avoid any elective procedures or surgeries for the initial 6 months after a "Myocardial Infarction" (Heart Attack). . Blood thinners: It is imperative that you stop these medications before procedures. Let us know if you if you take any blood thinner.  . Infection: Avoid procedures during or within two weeks of an infection (including chest colds or gastrointestinal problems). Symptoms associated with infections include: Localized redness, fever, chills, night sweats or profuse sweating, burning sensation when voiding, cough, congestion, stuffiness, runny nose, sore throat, diarrhea, nausea, vomiting, cold or Flu symptoms, recent or current infections. It is specially important if the infection is over the area that we intend to treat. . Heart and lung problems: Symptoms that may suggest an active cardiopulmonary problem include: cough, chest pain, breathing difficulties or shortness of breath, dizziness, ankle swelling, uncontrolled high or unusually low blood pressure, and/or palpitations. If you are experiencing any of these symptoms, cancel   your procedure and contact your primary care physician for an evaluation.  Remember:   Regular Business hours are:  Monday to Thursday 8:00 AM to 4:00 PM  Provider's Schedule: Milinda Pointer, MD:  Procedure days: Tuesday and Thursday 7:30 AM to 4:00 PM  Gillis Santa, MD:  Procedure days: Monday and Wednesday 7:30 AM to 4:00 PM ____________________________________________________________________________________________   ____________________________________________________________________________________________  Medication Rules  Applies to: All patients receiving prescriptions (written or electronic).  Pharmacy of record: Pharmacy where electronic prescriptions will be sent. If written prescriptions are taken to a different pharmacy, please inform the nursing staff. The pharmacy listed in the electronic medical record should be the one where you would like electronic prescriptions to be sent.  Prescription refills: Only during scheduled appointments. Applies to both, written and electronic prescriptions.  NOTE: The following applies primarily to controlled substances (Opioid* Pain Medications).   Patient's responsibilities: 1. Pain Pills: Bring all pain pills to every appointment (except for procedure appointments). 2. Pill Bottles: Bring pills in original pharmacy bottle. Always bring newest bottle. Bring bottle, even if empty. 3. Medication refills: You are responsible for knowing and keeping track of what medications you need refilled. The day before your appointment, write a list of all prescriptions that need to be refilled. Bring that list to your appointment and give it to the admitting nurse. Prescriptions will be written only during appointments. If you forget a medication, it will not be "Called in", "Faxed", or "electronically sent". You will need to get another appointment to get these prescribed. 4. Prescription Accuracy: You are responsible for carefully inspecting your prescriptions before leaving our office. Have the discharge nurse carefully go over  each prescription with you, before taking them home. Make sure that your name is accurately spelled, that your address is correct. Check the name and dose of your medication to make sure it is accurate. Check the number of pills, and the written instructions to make sure they are clear and accurate. Make sure that you are given enough medication to last until your next medication refill appointment. 5. Taking Medication: Take medication as prescribed. Never take more pills than instructed. Never take medication more frequently than prescribed. Taking less pills or less frequently is permitted and encouraged, when it comes to controlled substances (written prescriptions).  6. Inform other Doctors: Always inform, all of your healthcare providers, of all the medications you take. 7. Pain Medication from other Providers: You are not allowed to accept any additional pain medication from any other Doctor or Healthcare provider. There are two exceptions to this rule. (see below) In the event that you require additional pain medication, you are responsible for notifying us, as stated below. 8. Medication Agreement: You are responsible for carefully reading and following our Medication Agreement. This must be signed before receiving any prescriptions from our practice. Safely store a copy of your signed Agreement. Violations to the Agreement will result in no further prescriptions. (Additional copies of our Medication Agreement are available upon request.) 9. Laws, Rules, & Regulations: All patients are expected to follow all Federal and [employer], [employer], Rules, [employer]. Ignorance of the Laws does not constitute a valid excuse. The use of any illegal substances is prohibited. 10. Adopted CDC guidelines & recommendations: Target dosing levels will be at or below 60 MME/day. Use of benzodiazepines** is not recommended.  Exceptions: There are only two exceptions to the rule of not receiving pain medications  from other Healthcare Providers. 1. Exception #1 (Emergencies): In the  event of an emergency (i.e.: accident requiring emergency care), you are allowed to receive additional pain medication. However, you are responsible for: As soon as you are able, call our office [phone], at any time of the day or night, and leave a message stating your name, the date and nature of the emergency, and the name and dose of the medication prescribed. In the event that your call is answered by a member of our staff, make sure to document and save the date, time, and the name of the person that took your information.  2. Exception #2 (Planned Surgery): In the event that you are scheduled by another doctor or dentist to have any type of surgery or procedure, you are allowed (for a period no longer than 30 days), to receive additional pain medication, for the acute post-op pain. However, in this case, you are responsible for picking up a copy of our "Post-op Pain Management for Surgeons" handout, and giving it to your surgeon or dentist. This document is available at our office, and does not require an appointment to obtain it. Simply go to our office during business hours (Monday-Thursday from 8:00 AM to 4:00 PM) (Friday 8:00 AM to 12:00 Noon) or if you have a scheduled appointment with [place], prior to your surgery, and ask for it by name. In addition, you will need to provide [place] with your name, name of your surgeon, type of surgery, and date of procedure or surgery.  *Opioid medications include: morphine, codeine, oxycodone, oxymorphone, hydrocodone, hydromorphone, meperidine, tramadol, tapentadol, buprenorphine, fentanyl, methadone. **Benzodiazepine medications include: diazepam (Valium), alprazolam (Xanax), clonazepam (Klonopine), lorazepam (Ativan), clorazepate (Tranxene), chlordiazepoxide (Librium), estazolam (Prosom), oxazepam (Serax), temazepam (Restoril), triazolam (Halcion) (Last updated:  04/11/2017) ____________________________________________________________________________________________   ____________________________________________________________________________________________  Medication Recommendations and Reminders  Applies to: All patients receiving prescriptions (written and/or electronic).  Medication Rules & Regulations: These rules and regulations exist for your safety and that of others. They are not flexible and neither are we. Dismissing or ignoring them will be considered "non-compliance" with medication therapy, resulting in complete and irreversible termination of such therapy. (See document titled "Medication Rules" for more details.) In all conscience, because of safety reasons, we cannot continue providing a therapy where the patient does not follow instructions.  Pharmacy of record:   Definition: This is the pharmacy where your electronic prescriptions will be sent.   We do not endorse any particular pharmacy.  You are not restricted in your choice of pharmacy.  The pharmacy listed in the electronic medical record should be the one where you want electronic prescriptions to be sent.  If you choose to change pharmacy, simply notify our nursing staff of your choice of new pharmacy.  Recommendations:  Keep all of your pain medications in a safe place, under lock and key, even if you live alone.   After you fill your prescription, take 1 week's worth of pills and put them away in a safe place. You should keep a separate, properly labeled bottle for this purpose. The remainder should be kept in the original bottle. Use this as your primary supply, until it runs out. Once it's gone, then you know that you have 1 week's worth of medicine, and it is time to come in for a prescription refill. If you do this correctly, it is unlikely that you will ever run out of medicine.  To make sure that the above recommendation works, it is very important that you  make sure your medication refill appointments are  scheduled at least 1 week before you run out of medicine. To do this in an effective manner, make sure that you do not leave the office without scheduling your next medication management appointment. Always ask the nursing staff to show you in your prescription , when your medication will be running out. Then arrange for the receptionist to get you a return appointment, at least 7 days before you run out of medicine. Do not wait until you have 1 or 2 pills left, to come in. This is very poor planning and does not take into consideration that we may need to cancel appointments due to bad weather, sickness, or emergencies affecting our staff.  "Partial Fill": If for any reason your pharmacy does not have enough pills/tablets to completely fill or refill your prescription, do not allow for a "partial fill". You will need a separate prescription to fill the remaining amount, which we will not provide. If the reason for the partial fill is your insurance, you will need to talk to the pharmacist about payment alternatives for the remaining tablets, but again, do not accept a partial fill.  Prescription refills and/or changes in medication(s):   Prescription refills, and/or changes in dose or medication, will be conducted only during scheduled medication management appointments. (Applies to both, written and electronic prescriptions.)  No refills on procedure days. No medication will be changed or started on procedure days. No changes, adjustments, and/or refills will be conducted on a procedure day. Doing so will interfere with the diagnostic portion of the procedure.  No phone refills. No medications will be "called into the pharmacy".  No Fax refills.  No weekend refills.  No Holliday refills.  No after hours refills.  Remember:  Business hours are:  Monday to Thursday 8:00 AM to 4:00 PM Provider's Schedule: Dionisio David, NP - Appointments are:   Medication management: Monday to Thursday 8:00 AM to 4:00 PM Milinda Pointer, MD - Appointments are:  Medication management: Monday and Wednesday 8:00 AM to 4:00 PM Procedure day: Tuesday and Thursday 7:30 AM to 4:00 PM Gillis Santa, MD - Appointments are:  Medication management: Tuesday and Thursday 8:00 AM to 4:00 PM Procedure day: Monday and Wednesday 7:30 AM to 4:00 PM (Last update: 04/11/2017) ____________________________________________________________________________________________   ____________________________________________________________________________________________  CANNABIDIOL (AKA: CBD Oil or Pills)  Applies to: All patients receiving prescriptions of controlled substances (written and/or electronic).  General Information: Cannabidiol (CBD) was discovered in 55. It is one of some 113 identified cannabinoids in cannabis (Marijuana) plants, accounting for up to 40% of the plant's extract. As of 2018, preliminary clinical research on cannabidiol included studies of anxiety, cognition, movement disorders, and pain.  Cannabidiol is consummed in multiple ways, including inhalation of cannabis smoke or vapor, as an aerosol spray into the cheek, and by mouth. It may be supplied as CBD oil containing CBD as the active ingredient (no added tetrahydrocannabinol (THC) or terpenes), a full-plant CBD-dominant hemp extract oil, capsules, dried cannabis, or as a liquid solution. CBD is thought not have the same psychoactivity as THC, and may affect the actions of THC. Studies suggest that CBD may interact with different biological targets, including cannabinoid receptors and other neurotransmitter receptors. As of 2018 the mechanism of action for its biological effects has not been determined.  In the [place], cannabidiol has a limited approval by the Food and Drug Administration (FDA) for treatment of only two types of epilepsy disorders. The side effects of long-term use of the  drug include  somnolence, decreased appetite, diarrhea, fatigue, malaise, weakness, sleeping problems, and others.  CBD remains a Schedule I drug prohibited for any use.  Legality: Some manufacturers ship CBD products nationally, an illegal action which the FDA has not enforced in 2018, with CBD remaining the subject of an FDA investigational new drug evaluation, and is not considered legal as a dietary supplement or food ingredient as of December 2018. Federal illegality has made it difficult historically to conduct research on CBD. CBD is openly sold in head shops and health food stores in some states where such sales have not been explicitly legalized.  Warning: Because it is not FDA approved for general use or treatment of pain, it is not required to undergo the same manufacturing controls as prescription drugs.  This means that the available cannabidiol (CBD) may be contaminated with THC.  If this is the case, it will trigger a positive urine drug screen (UDS) test for cannabinoids (Marijuana).  Because a positive UDS for illicit substances is a violation of our medication agreement, your opioid analgesics (pain medicine) may be permanently discontinued. (Last update: 05/02/2017) ____________________________________________________________________________________________

## 2017-10-03 ENCOUNTER — Ambulatory Visit: Payer: Medicare HMO | Admitting: Psychiatry

## 2017-10-03 DIAGNOSIS — I2510 Atherosclerotic heart disease of native coronary artery without angina pectoris: Secondary | ICD-10-CM | POA: Diagnosis not present

## 2017-10-04 DIAGNOSIS — I2510 Atherosclerotic heart disease of native coronary artery without angina pectoris: Secondary | ICD-10-CM | POA: Diagnosis not present

## 2017-10-04 DIAGNOSIS — M5440 Lumbago with sciatica, unspecified side: Secondary | ICD-10-CM | POA: Diagnosis not present

## 2017-10-04 DIAGNOSIS — Z760 Encounter for issue of repeat prescription: Secondary | ICD-10-CM | POA: Diagnosis not present

## 2017-10-04 DIAGNOSIS — G9782 Other postprocedural complications and disorders of nervous system: Secondary | ICD-10-CM | POA: Diagnosis not present

## 2017-10-04 DIAGNOSIS — R200 Anesthesia of skin: Secondary | ICD-10-CM | POA: Diagnosis not present

## 2017-10-04 DIAGNOSIS — G8929 Other chronic pain: Secondary | ICD-10-CM | POA: Diagnosis not present

## 2017-10-05 DIAGNOSIS — I2510 Atherosclerotic heart disease of native coronary artery without angina pectoris: Secondary | ICD-10-CM | POA: Diagnosis not present

## 2017-10-06 DIAGNOSIS — I2510 Atherosclerotic heart disease of native coronary artery without angina pectoris: Secondary | ICD-10-CM | POA: Diagnosis not present

## 2017-10-07 DIAGNOSIS — I2510 Atherosclerotic heart disease of native coronary artery without angina pectoris: Secondary | ICD-10-CM | POA: Diagnosis not present

## 2017-10-08 DIAGNOSIS — I2510 Atherosclerotic heart disease of native coronary artery without angina pectoris: Secondary | ICD-10-CM | POA: Diagnosis not present

## 2017-10-09 DIAGNOSIS — S93402S Sprain of unspecified ligament of left ankle, sequela: Secondary | ICD-10-CM | POA: Insufficient documentation

## 2017-10-09 DIAGNOSIS — G90522 Complex regional pain syndrome I of left lower limb: Secondary | ICD-10-CM | POA: Insufficient documentation

## 2017-10-09 DIAGNOSIS — M25511 Pain in right shoulder: Secondary | ICD-10-CM

## 2017-10-09 DIAGNOSIS — Z8659 Personal history of other mental and behavioral disorders: Secondary | ICD-10-CM | POA: Insufficient documentation

## 2017-10-09 DIAGNOSIS — M25372 Other instability, left ankle: Secondary | ICD-10-CM | POA: Insufficient documentation

## 2017-10-09 DIAGNOSIS — M25512 Pain in left shoulder: Secondary | ICD-10-CM

## 2017-10-09 DIAGNOSIS — G8929 Other chronic pain: Secondary | ICD-10-CM | POA: Insufficient documentation

## 2017-10-09 DIAGNOSIS — I2510 Atherosclerotic heart disease of native coronary artery without angina pectoris: Secondary | ICD-10-CM | POA: Diagnosis not present

## 2017-10-10 DIAGNOSIS — I2510 Atherosclerotic heart disease of native coronary artery without angina pectoris: Secondary | ICD-10-CM | POA: Diagnosis not present

## 2017-10-11 DIAGNOSIS — I2510 Atherosclerotic heart disease of native coronary artery without angina pectoris: Secondary | ICD-10-CM | POA: Diagnosis not present

## 2017-10-12 DIAGNOSIS — I2510 Atherosclerotic heart disease of native coronary artery without angina pectoris: Secondary | ICD-10-CM | POA: Diagnosis not present

## 2017-10-13 DIAGNOSIS — I2510 Atherosclerotic heart disease of native coronary artery without angina pectoris: Secondary | ICD-10-CM | POA: Diagnosis not present

## 2017-10-13 DIAGNOSIS — G4733 Obstructive sleep apnea (adult) (pediatric): Secondary | ICD-10-CM | POA: Diagnosis not present

## 2017-10-14 DIAGNOSIS — I2510 Atherosclerotic heart disease of native coronary artery without angina pectoris: Secondary | ICD-10-CM | POA: Diagnosis not present

## 2017-10-15 DIAGNOSIS — I2510 Atherosclerotic heart disease of native coronary artery without angina pectoris: Secondary | ICD-10-CM | POA: Diagnosis not present

## 2017-10-16 DIAGNOSIS — I2510 Atherosclerotic heart disease of native coronary artery without angina pectoris: Secondary | ICD-10-CM | POA: Diagnosis not present

## 2017-10-17 ENCOUNTER — Other Ambulatory Visit: Payer: Self-pay | Admitting: Psychiatry

## 2017-10-17 DIAGNOSIS — I2510 Atherosclerotic heart disease of native coronary artery without angina pectoris: Secondary | ICD-10-CM | POA: Diagnosis not present

## 2017-10-18 DIAGNOSIS — I2510 Atherosclerotic heart disease of native coronary artery without angina pectoris: Secondary | ICD-10-CM | POA: Diagnosis not present

## 2017-10-19 DIAGNOSIS — I2510 Atherosclerotic heart disease of native coronary artery without angina pectoris: Secondary | ICD-10-CM | POA: Diagnosis not present

## 2017-10-20 DIAGNOSIS — I2510 Atherosclerotic heart disease of native coronary artery without angina pectoris: Secondary | ICD-10-CM | POA: Diagnosis not present

## 2017-10-22 ENCOUNTER — Ambulatory Visit (HOSPITAL_BASED_OUTPATIENT_CLINIC_OR_DEPARTMENT_OTHER): Payer: Medicare HMO | Admitting: Pain Medicine

## 2017-10-22 ENCOUNTER — Other Ambulatory Visit: Payer: Self-pay

## 2017-10-22 ENCOUNTER — Encounter: Payer: Self-pay | Admitting: Pain Medicine

## 2017-10-22 ENCOUNTER — Ambulatory Visit
Admission: RE | Admit: 2017-10-22 | Discharge: 2017-10-22 | Disposition: A | Discharge status: Home / Self Care | Payer: Medicare HMO | Source: Ambulatory Visit | Attending: Pain Medicine | Admitting: Pain Medicine

## 2017-10-22 VITALS — BP 108/60 | HR 115 | Temp 97.2°F | Resp 12 | Ht 64.0 in | Wt 225.0 lb

## 2017-10-22 DIAGNOSIS — Z882 Allergy status to sulfonamides status: Secondary | ICD-10-CM | POA: Diagnosis not present

## 2017-10-22 DIAGNOSIS — M545 Low back pain, unspecified: Secondary | ICD-10-CM

## 2017-10-22 DIAGNOSIS — G8929 Other chronic pain: Secondary | ICD-10-CM

## 2017-10-22 DIAGNOSIS — Z880 Allergy status to penicillin: Secondary | ICD-10-CM | POA: Insufficient documentation

## 2017-10-22 DIAGNOSIS — M47817 Spondylosis without myelopathy or radiculopathy, lumbosacral region: Secondary | ICD-10-CM | POA: Diagnosis not present

## 2017-10-22 DIAGNOSIS — Z881 Allergy status to other antibiotic agents status: Secondary | ICD-10-CM | POA: Diagnosis not present

## 2017-10-22 DIAGNOSIS — M47816 Spondylosis without myelopathy or radiculopathy, lumbar region: Secondary | ICD-10-CM | POA: Insufficient documentation

## 2017-10-22 DIAGNOSIS — Z87898 Personal history of other specified conditions: Secondary | ICD-10-CM

## 2017-10-22 MED ORDER — FENTANYL CITRATE (PF) 100 MCG/2ML IJ SOLN
25.0000 ug | INTRAMUSCULAR | Status: DC | PRN
  Administered 2017-10-22: 50 ug via INTRAVENOUS
  Filled 2017-10-22: qty 2

## 2017-10-22 MED ORDER — DIPHENHYDRAMINE HCL 50 MG/ML IJ SOLN
12.5000 mg | Freq: Once | INTRAMUSCULAR | Status: AC
  Administered 2017-10-22: 12.5 mg via INTRAVENOUS
  Filled 2017-10-22: qty 1

## 2017-10-22 MED ORDER — MIDAZOLAM HCL 5 MG/5ML IJ SOLN
1.0000 mg | INTRAMUSCULAR | Status: DC | PRN
  Administered 2017-10-22: 2 mg via INTRAVENOUS
  Filled 2017-10-22: qty 5

## 2017-10-22 MED ORDER — TRIAMCINOLONE ACETONIDE 40 MG/ML IJ SUSP
80.0000 mg | Freq: Once | INTRAMUSCULAR | Status: AC
  Administered 2017-10-22: 80 mg
  Filled 2017-10-22: qty 2

## 2017-10-22 MED ORDER — ROPIVACAINE HCL 2 MG/ML IJ SOLN
18.0000 mL | Freq: Once | INTRAMUSCULAR | Status: AC
  Administered 2017-10-22: 18 mL via PERINEURAL
  Filled 2017-10-22: qty 20

## 2017-10-22 MED ORDER — ONDANSETRON HCL 4 MG/2ML IJ SOLN
4.0000 mg | Freq: Once | INTRAMUSCULAR | Status: AC
  Administered 2017-10-22: 4 mg via INTRAVENOUS
  Filled 2017-10-22: qty 2

## 2017-10-22 MED ORDER — LACTATED RINGERS IV SOLN
1000.0000 mL | Freq: Once | INTRAVENOUS | Status: AC
  Administered 2017-10-22: 1000 mL via INTRAVENOUS

## 2017-10-22 MED ORDER — LIDOCAINE HCL 2 % IJ SOLN
20.0000 mL | Freq: Once | INTRAMUSCULAR | Status: AC
  Administered 2017-10-22: 400 mg
  Filled 2017-10-22: qty 40

## 2017-10-22 NOTE — Progress Notes (Signed)
Safety precautions to be maintained throughout the outpatient stay will include: orient to surroundings, keep bed in low position, maintain call bell within reach at all times, provide assistance with transfer out of bed and ambulation.  

## 2017-10-22 NOTE — Patient Instructions (Addendum)
____________________________________________________________________________________________  Post-Procedure Discharge Instructions  Instructions:  Apply ice: Fill a plastic sandwich bag with crushed ice. Cover it with a small towel and apply to injection site. Apply for 15 minutes then remove x 15 minutes. Repeat sequence on day of procedure, until you go to bed. The purpose is to minimize swelling and discomfort after procedure.  Apply heat: Apply heat to procedure site starting the day following the procedure. The purpose is to treat any soreness and discomfort from the procedure.  Food intake: Start with clear liquids (like water) and advance to regular food, as tolerated.   Physical activities: Keep activities to a minimum for the first 8 hours after the procedure.   Driving: If you have received any sedation, you are not allowed to drive for 24 hours after your procedure.  Blood thinner: Restart your blood thinner 6 hours after your procedure. (Only for those taking blood thinners)  Insulin: As soon as you can eat, you may resume your normal dosing schedule. (Only for those taking insulin)  Infection prevention: Keep procedure site clean and dry.  Post-procedure Pain Diary: Extremely important that this be done correctly and accurately. Recorded information will be used to determine the next step in treatment.  Pain evaluated is that of treated area only. Do not include pain from an untreated area.  Complete every hour, on the hour, for the initial 8 hours. Set an alarm to help you do this part accurately.  Do not go to sleep and have it completed later. It will not be accurate.  Follow-up appointment: Keep your follow-up appointment after the procedure. Usually 2 weeks for most procedures. (6 weeks in the case of radiofrequency.) Bring you pain diary.   Expect:  From numbing medicine (AKA: Local Anesthetics): Numbness or decrease in pain.  Onset: Full effect within 15  minutes of injected.  Duration: It will depend on the type of local anesthetic used. On the average, 1 to 8 hours.   From steroids: Decrease in swelling or inflammation. Once inflammation is improved, relief of the pain will follow.  Onset of benefits: Depends on the amount of swelling present. The more swelling, the longer it will take for the benefits to be seen. In some cases, up to 10 days.  Duration: Steroids will stay in the system x 2 weeks. Duration of benefits will depend on multiple posibilities including persistent irritating factors.  Occasional side-effects: Facial flushing, cramps (if present, drink Gatorade and take over-the-counter Magnesium 450-500 mg once to twice a day).  From procedure: Some discomfort is to be expected once the numbing medicine wears off. This should be minimal if ice and heat are applied as instructed.  Call if:  You experience numbness and weakness that gets worse with time, as opposed to wearing off.  New onset bowel or bladder incontinence. (This applies to Spinal procedures only)  Emergency Numbers:  Durning business hours (Monday - Thursday, 8:00 AM - 4:00 PM) (Friday, 9:00 AM - 12:00 Noon): [phone]  After hours: [phone] ____________________________________________________________________________________________   Pain Management Discharge Instructions  General Discharge Instructions :  If you need to reach your doctor call: Monday-Friday 8:00 am - 4:00 pm at [phone] or toll free [phone].  After clinic hours [phone] to have operator reach doctor.  Bring all of your medication bottles to all your appointments in the pain clinic.  To cancel or reschedule your appointment with Pain Management please remember to call 24 hours in advance to avoid a fee.  Refer to the educational materials which you have been given on: General Risks, I had my Procedure. Discharge Instructions, Post Sedation.  Post Procedure  Instructions:  The drugs you were given will stay in your system until tomorrow, so for the next 24 hours you should not drive, make any legal decisions or drink any alcoholic beverages.  You may eat anything you prefer, but it is better to start with liquids then soups and crackers, and gradually work up to solid foods.  Please notify your doctor immediately if you have any unusual bleeding, trouble breathing or pain that is not related to your normal pain.  Depending on the type of procedure that was done, some parts of your body may feel week and/or numb.  This usually clears up by tonight or the next day.  Walk with the use of an assistive device or accompanied by an adult for the 24 hours.  You may use ice on the affected area for the first 24 hours.  Put ice in a Ziploc bag and cover with a towel and place against area 15 minutes on 15 minutes off.  You may switch to heat after 24 hours.

## 2017-10-22 NOTE — Progress Notes (Signed)
Patient's Name: Marie Clarke  MRN: [id number]  Referring Provider: Milinda Pointer, MD  DOB: 11-25-1970  PCP: Sharyne Peach, MD  DOS: 10/22/2017  Note by: Gaspar Cola, MD  Service setting: Ambulatory outpatient  Specialty: Interventional Pain Management  Patient type: Established  Location: [facility] (AMB) Pain Management Facility  Visit type: Interventional Procedure   Primary Reason for Visit: Interventional Pain Management Treatment. CC: Back Pain (lower)  Procedure:          Anesthesia, Analgesia, Anxiolysis:  Type: Lumbar Facet, Medial Branch Block(s) #1  Primary Purpose: Diagnostic Region: Posterolateral Lumbosacral Spine Level: L2, L3, L4, L5, & S1 Medial Branch Level(s). Injecting these levels blocks the L3-4, L4-5, and L5-S1 lumbar facet joints. Laterality: Bilateral  Type: Moderate (Conscious) Sedation combined with Local Anesthesia Indication(s): Analgesia and Anxiety Route: Intravenous (IV) IV Access: Secured Sedation: Meaningful verbal contact was maintained at all times during the procedure  Local Anesthetic: Lidocaine 1-2%   Indications: 1. Spondylosis without myelopathy or radiculopathy, lumbosacral region   2. Lumbar facet hypertrophy   3. Lumbar facet syndrome (Bilateral) (L>R)   4. Chronic low back pain (Primary Area of Pain) (Bilateral) (L>R)   5. History of postoperative nausea    Pain Score: Pre-procedure: 7 /10 Post-procedure: 0-No pain/10  Pre-op Assessment:  Marie Clarke is a 47 y.o. (year old), female patient, seen today for interventional treatment. She  has a past surgical history that includes prolapse rectum surgery (N/A, July 2016); Abdominal hysterectomy; Tonsillectomy; and Hernia repair (07/15/2017). Marie Clarke has a current medication list which includes the following prescription(s): acetaminophen, albuterol, aspirin ec, atorvastatin, azelastine, buspirone, cvs nicotine polacrilex, gabapentin, hydroxyzine, hydroxyzine, klor-con m20, lamotrigine,  levocetirizine, levothyroxine, metformin, methocarbamol, montelukast, omega-3 fish oil, promethazine, quetiapine, tramadol, trelegy ellipta, azithromycin, bumetanide, glipizide, and levothyroxine, and the following Facility-Administered Medications: fentanyl and midazolam. Her primarily concern today is the Back Pain (lower)  Initial Vital Signs:  Pulse/HCG Rate: (!) 115ECG Heart Rate: (!) 108 Temp: 98.3 F (36.8 C) Resp: 18 BP: (!) 126/98 SpO2: 98 %  BMI: Estimated body mass index is 38.62 kg/m as calculated from the following:   Height as of this encounter: 5\' 4"  (1.626 m).   Weight as of this encounter: 225 lb (102.1 kg).  Risk Assessment: Allergies: Reviewed. She is allergic to diazepam; levofloxacin; metronidazole; pregabalin; sulfa antibiotics; cephalexin; ciprofloxacin; ziprasidone hcl; and penicillins.  Allergy Precautions: None required Coagulopathies: Reviewed. None identified.  Blood-thinner therapy: None at this time Active Infection(s): Reviewed. None identified. Marie Clarke is afebrile  Site Confirmation: Marie Clarke was asked to confirm the procedure and laterality before marking the site Procedure checklist: Completed Consent: Before the procedure and under the influence of no sedative(s), amnesic(s), or anxiolytics, the patient was informed of the treatment options, risks and possible complications. To fulfill our ethical and legal obligations, as recommended by the American Medical Association's Code of Ethics, I have informed the patient of my clinical impression; the nature and purpose of the treatment or procedure; the risks, benefits, and possible complications of the intervention; the alternatives, including doing nothing; the risk(s) and benefit(s) of the alternative treatment(s) or procedure(s); and the risk(s) and benefit(s) of doing nothing. The patient was provided information about the general risks and possible complications associated with the procedure. These may  include, but are not limited to: failure to achieve desired goals, infection, bleeding, organ or nerve damage, allergic reactions, paralysis, and death. In addition, the patient was informed of those risks and complications associated to Spine-related procedures,  such as failure to decrease pain; infection (i.e.: Meningitis, epidural or intraspinal abscess); bleeding (i.e.: epidural hematoma, subarachnoid hemorrhage, or any other type of intraspinal or peri-dural bleeding); organ or nerve damage (i.e.: Any type of peripheral nerve, nerve root, or spinal cord injury) with subsequent damage to sensory, motor, and/or autonomic systems, resulting in permanent pain, numbness, and/or weakness of one or several areas of the body; allergic reactions; (i.e.: anaphylactic reaction); and/or death. Furthermore, the patient was informed of those risks and complications associated with the medications. These include, but are not limited to: allergic reactions (i.e.: anaphylactic or anaphylactoid reaction(s)); adrenal axis suppression; blood sugar elevation that in diabetics may result in ketoacidosis or comma; water retention that in patients with history of congestive heart failure may result in shortness of breath, pulmonary edema, and decompensation with resultant heart failure; weight gain; swelling or edema; medication-induced neural toxicity; particulate matter embolism and blood vessel occlusion with resultant organ, and/or nervous system infarction; and/or aseptic necrosis of one or more joints. Finally, the patient was informed that Medicine is not an exact science; therefore, there is also the possibility of unforeseen or unpredictable risks and/or possible complications that may result in a catastrophic outcome. The patient indicated having understood very clearly. We have given the patient no guarantees and we have made no promises. Enough time was given to the patient to ask questions, all of which were answered  to the patient's satisfaction. Marie Clarke has indicated that she wanted to continue with the procedure. Attestation: I, the ordering provider, attest that I have discussed with the patient the benefits, risks, side-effects, alternatives, likelihood of achieving goals, and potential problems during recovery for the procedure that I have provided informed consent. Date  Time: 10/22/2017  8:04 AM  Pre-Procedure Preparation:  Monitoring: As per clinic protocol. Respiration, ETCO2, SpO2, BP, heart rate and rhythm monitor placed and checked for adequate function Safety Precautions: Patient was assessed for positional comfort and pressure points before starting the procedure. Time-out: I initiated and conducted the "Time-out" before starting the procedure, as per protocol. The patient was asked to participate by confirming the accuracy of the "Time Out" information. Verification of the correct person, site, and procedure were performed and confirmed by me, the nursing staff, and the patient. "Time-out" conducted as per Joint Commission's Universal Protocol (UP.01.01.01). Time: 0834  Description of Procedure:          Position: Prone Laterality: Bilateral. The procedure was performed in identical fashion on both sides. Levels:  L2, L3, L4, L5, & S1 Medial Branch Level(s) Area Prepped: Posterior Lumbosacral Region Prepping solution: ChloraPrep (2% chlorhexidine gluconate and 70% isopropyl alcohol) Safety Precautions: Aspiration looking for blood return was conducted prior to all injections. At no point did we inject any substances, as a needle was being advanced. Before injecting, the patient was told to immediately notify me if she was experiencing any new onset of "ringing in the ears, or metallic taste in the mouth". No attempts were made at seeking any paresthesias. Safe injection practices and needle disposal techniques used. Medications properly checked for expiration dates. SDV (single dose vial)  medications used. After the completion of the procedure, all disposable equipment used was discarded in the proper designated medical waste containers. Local Anesthesia: Protocol guidelines were followed. The patient was positioned over the fluoroscopy table. The area was prepped in the usual manner. The time-out was completed. The target area was identified using fluoroscopy. A 12-in long, straight, sterile hemostat was used with fluoroscopic guidance  to locate the targets for each level blocked. Once located, the skin was marked with an approved surgical skin marker. Once all sites were marked, the skin (epidermis, dermis, and hypodermis), as well as deeper tissues (fat, connective tissue and muscle) were infiltrated with a small amount of a short-acting local anesthetic, loaded on a 10cc syringe with a 25G, 1.5-in  Needle. An appropriate amount of time was allowed for local anesthetics to take effect before proceeding to the next step. Local Anesthetic: Lidocaine 2.0% The unused portion of the local anesthetic was discarded in the proper designated containers. Technical explanation of process:  L2 Medial Branch Nerve Block (MBB): The target area for the L2 medial branch is at the junction of the postero-lateral aspect of the superior articular process and the superior, posterior, and medial edge of the transverse process of L3. Under fluoroscopic guidance, a Quincke needle was inserted until contact was made with os over the superior postero-lateral aspect of the pedicular shadow (target area). After negative aspiration for blood, 0.5 mL of the nerve block solution was injected without difficulty or complication. The needle was removed intact. L3 Medial Branch Nerve Block (MBB): The target area for the L3 medial branch is at the junction of the postero-lateral aspect of the superior articular process and the superior, posterior, and medial edge of the transverse process of L4. Under fluoroscopic guidance, a  Quincke needle was inserted until contact was made with os over the superior postero-lateral aspect of the pedicular shadow (target area). After negative aspiration for blood, 0.5 mL of the nerve block solution was injected without difficulty or complication. The needle was removed intact. L4 Medial Branch Nerve Block (MBB): The target area for the L4 medial branch is at the junction of the postero-lateral aspect of the superior articular process and the superior, posterior, and medial edge of the transverse process of L5. Under fluoroscopic guidance, a Quincke needle was inserted until contact was made with os over the superior postero-lateral aspect of the pedicular shadow (target area). After negative aspiration for blood, 0.5 mL of the nerve block solution was injected without difficulty or complication. The needle was removed intact. L5 Medial Branch Nerve Block (MBB): The target area for the L5 medial branch is at the junction of the postero-lateral aspect of the superior articular process and the superior, posterior, and medial edge of the sacral ala. Under fluoroscopic guidance, a Quincke needle was inserted until contact was made with os over the superior postero-lateral aspect of the pedicular shadow (target area). After negative aspiration for blood, 0.5 mL of the nerve block solution was injected without difficulty or complication. The needle was removed intact. S1 Medial Branch Nerve Block (MBB): The target area for the S1 medial branch is at the posterior and inferior 6 o'clock position of the L5-S1 facet joint. Under fluoroscopic guidance, the Quincke needle inserted for the L5 MBB was redirected until contact was made with os over the inferior and postero aspect of the sacrum, at the 6 o' clock position under the L5-S1 facet joint (Target area). After negative aspiration for blood, 0.5 mL of the nerve block solution was injected without difficulty or complication. The needle was removed  intact. Procedural Needles: 22-gauge, 3.5-inch, Quincke needles used for all levels. Nerve block solution: 0.2% PF-Ropivacaine + Triamcinolone (40 mg/mL) diluted to a final concentration of 4 mg of Triamcinolone/mL of Ropivacaine The unused portion of the solution was discarded in the proper designated containers.  Once the entire procedure was completed,  the treated area was cleaned, making sure to leave some of the prepping solution back to take advantage of its long term bactericidal properties.   Illustration of the posterior view of the lumbar spine and the posterior neural structures. Laminae of L2 through S1 are labeled. DPRL5, dorsal primary ramus of L5; DPRS1, dorsal primary ramus of S1; DPR3, dorsal primary ramus of L3; FJ, facet (zygapophyseal) joint L3-L4; I, inferior articular process of L4; LB1, lateral branch of dorsal primary ramus of L1; IAB, inferior articular branches from L3 medial branch (supplies L4-L5 facet joint); IBP, intermediate branch plexus; MB3, medial branch of dorsal primary ramus of L3; NR3, third lumbar nerve root; S, superior articular process of L5; SAB, superior articular branches from L4 (supplies L4-5 facet joint also); TP3, transverse process of L3.  Vitals:   10/22/17 0857 10/22/17 0900 10/22/17 0907 10/22/17 0916  BP: 111/72  111/72 108/60  Pulse:      Resp: 17  20 12   Temp:  (!) 97.2 F (36.2 C)    TempSrc:  Temporal    SpO2: 95%  97% 98%  Weight:      Height:        Start Time: 0834 hrs. End Time: 0845 hrs.  Imaging Guidance (Spinal):          Type of Imaging Technique: Fluoroscopy Guidance (Spinal) Indication(s): Assistance in needle guidance and placement for procedures requiring needle placement in or near specific anatomical locations not easily accessible without such assistance. Exposure Time: Please see nurses notes. Contrast: None used. Fluoroscopic Guidance: I was personally present during the use of fluoroscopy. "Tunnel Vision  Technique" used to obtain the best possible view of the target area. Parallax error corrected before commencing the procedure. "Direction-depth-direction" technique used to introduce the needle under continuous pulsed fluoroscopy. Once target was reached, antero-posterior, oblique, and lateral fluoroscopic projection used confirm needle placement in all planes. Images permanently stored in EMR. Interpretation: No contrast injected. I personally interpreted the imaging intraoperatively. Adequate needle placement confirmed in multiple planes. Permanent images saved into the patient's record.  Antibiotic Prophylaxis:   Anti-infectives (From admission, onward)   None     Indication(s): None identified  Post-operative Assessment:  Post-procedure Vital Signs:  Pulse/HCG Rate: (!) 115(!) 107 Temp: (!) 97.2 F (36.2 C) Resp: 12 BP: 108/60 SpO2: 98 %  EBL: None  Complications: No immediate post-treatment complications observed by team, or reported by patient.  Note: The patient tolerated the entire procedure well. A repeat set of vitals were taken after the procedure and the patient was kept under observation following institutional policy, for this type of procedure. Post-procedural neurological assessment was performed, showing return to baseline, prior to discharge. The patient was provided with post-procedure discharge instructions, including a section on how to identify potential problems. Should any problems arise concerning this procedure, the patient was given instructions to immediately contact us, at any time, without hesitation. In any case, we plan to contact the patient by telephone for a follow-up status report regarding this interventional procedure.  Comments:  No additional relevant information.  Plan of Care    Imaging Orders     DG C-Arm 1-60 Min-No Report  Procedure Orders     LUMBAR FACET(MEDIAL BRANCH NERVE BLOCK) MBNB  Medications ordered for procedure: Meds  ordered this encounter  Medications  . lidocaine (XYLOCAINE) 2 % (with pres) injection 400 mg  . midazolam (VERSED) 5 MG/5ML injection 1-2 mg    Make sure Flumazenil is available in the  pyxis when using this medication. If oversedation occurs, administer 0.2 mg IV over 15 sec. If after 45 sec no response, administer 0.2 mg again over 1 min; may repeat at 1 min intervals; not to exceed 4 doses (1 mg)  . fentaNYL (SUBLIMAZE) injection 25-50 mcg    Make sure Narcan is available in the pyxis when using this medication. In the event of respiratory depression (RR< 8/min): Titrate NARCAN (naloxone) in increments of 0.1 to 0.2 mg IV at 2-3 minute intervals, until desired degree of reversal.  . lactated ringers infusion 1,000 mL  . ropivacaine (PF) 2 mg/mL (0.2%) (NAROPIN) injection 18 mL  . triamcinolone acetonide (KENALOG-40) injection 80 mg  . diphenhydrAMINE (BENADRYL) injection 12.5 mg  . ondansetron (ZOFRAN) injection 4 mg   Medications administered: We administered lidocaine, midazolam, fentaNYL, lactated ringers, ropivacaine (PF) 2 mg/mL (0.2%), triamcinolone acetonide, diphenhydrAMINE, and ondansetron.  See the medical record for exact dosing, route, and time of administration.  New Prescriptions   No medications on file   Disposition: Discharge home  Discharge Date & Time: 10/22/2017; 0920 hrs.   Physician-requested Follow-up: Return for post-procedure eval (2 wks), w/ Dr. Dossie Arbour.  Future Appointments  Date Time Provider [place]  11/06/2017  9:15 AM Milinda Pointer, MD [facility] None  01/01/2018  8:30 AM Ursula Alert, MD [facility] None   Primary Care Physician: Sharyne Peach, MD Location: [facility] Note by: Gaspar Cola, MD Date: 10/22/2017; Time: 10:18 AM  Disclaimer:  Medicine is not an Chief Strategy Officer. The only guarantee in medicine is that nothing is guaranteed. It is important to note that the decision to proceed with  this intervention was based on the information collected from the patient. The Data and conclusions were drawn from the patient's questionnaire, the interview, and the physical examination. Because the information was provided in large part by the patient, it cannot be guaranteed that it has not been purposely or unconsciously manipulated. Every effort has been made to obtain as much relevant data as possible for this evaluation. It is important to note that the conclusions that lead to this procedure are derived in large part from the available data. Always take into account that the treatment will also be dependent on availability of resources and existing treatment guidelines, considered by other Pain Management Practitioners as being common knowledge and practice, at the time of the intervention. For Medico-Legal purposes, it is also important to point out that variation in procedural techniques and pharmacological choices are the acceptable norm. The indications, contraindications, technique, and results of the above procedure should only be interpreted and judged by a Board-Certified Interventional Pain Specialist with extensive familiarity and expertise in the same exact procedure and technique.

## 2017-10-23 ENCOUNTER — Telehealth: Payer: Self-pay

## 2017-10-23 NOTE — Telephone Encounter (Signed)
Patient denies any needs at this time. Instructed to call if needed.

## 2017-10-28 DIAGNOSIS — I2510 Atherosclerotic heart disease of native coronary artery without angina pectoris: Secondary | ICD-10-CM | POA: Diagnosis not present

## 2017-10-29 DIAGNOSIS — I2510 Atherosclerotic heart disease of native coronary artery without angina pectoris: Secondary | ICD-10-CM | POA: Diagnosis not present

## 2017-10-30 DIAGNOSIS — I2510 Atherosclerotic heart disease of native coronary artery without angina pectoris: Secondary | ICD-10-CM | POA: Diagnosis not present

## 2017-10-31 DIAGNOSIS — I2510 Atherosclerotic heart disease of native coronary artery without angina pectoris: Secondary | ICD-10-CM | POA: Diagnosis not present

## 2017-11-01 DIAGNOSIS — I2510 Atherosclerotic heart disease of native coronary artery without angina pectoris: Secondary | ICD-10-CM | POA: Diagnosis not present

## 2017-11-02 DIAGNOSIS — I2510 Atherosclerotic heart disease of native coronary artery without angina pectoris: Secondary | ICD-10-CM | POA: Diagnosis not present

## 2017-11-03 DIAGNOSIS — I2510 Atherosclerotic heart disease of native coronary artery without angina pectoris: Secondary | ICD-10-CM | POA: Diagnosis not present

## 2017-11-04 DIAGNOSIS — I2510 Atherosclerotic heart disease of native coronary artery without angina pectoris: Secondary | ICD-10-CM | POA: Diagnosis not present

## 2017-11-05 DIAGNOSIS — I2510 Atherosclerotic heart disease of native coronary artery without angina pectoris: Secondary | ICD-10-CM | POA: Diagnosis not present

## 2017-11-06 ENCOUNTER — Ambulatory Visit: Payer: Medicare HMO | Admitting: Pain Medicine

## 2017-11-06 DIAGNOSIS — I2510 Atherosclerotic heart disease of native coronary artery without angina pectoris: Secondary | ICD-10-CM | POA: Diagnosis not present

## 2017-11-07 DIAGNOSIS — L309 Dermatitis, unspecified: Secondary | ICD-10-CM | POA: Diagnosis not present

## 2017-11-07 DIAGNOSIS — I2510 Atherosclerotic heart disease of native coronary artery without angina pectoris: Secondary | ICD-10-CM | POA: Diagnosis not present

## 2017-11-08 DIAGNOSIS — I2510 Atherosclerotic heart disease of native coronary artery without angina pectoris: Secondary | ICD-10-CM | POA: Diagnosis not present

## 2017-11-09 DIAGNOSIS — I2510 Atherosclerotic heart disease of native coronary artery without angina pectoris: Secondary | ICD-10-CM | POA: Diagnosis not present

## 2017-11-10 DIAGNOSIS — I2510 Atherosclerotic heart disease of native coronary artery without angina pectoris: Secondary | ICD-10-CM | POA: Diagnosis not present

## 2017-11-11 DIAGNOSIS — I2510 Atherosclerotic heart disease of native coronary artery without angina pectoris: Secondary | ICD-10-CM | POA: Diagnosis not present

## 2017-11-12 DIAGNOSIS — I2510 Atherosclerotic heart disease of native coronary artery without angina pectoris: Secondary | ICD-10-CM | POA: Diagnosis not present

## 2017-11-12 DIAGNOSIS — G4733 Obstructive sleep apnea (adult) (pediatric): Secondary | ICD-10-CM | POA: Diagnosis not present

## 2017-11-13 ENCOUNTER — Ambulatory Visit: Payer: Medicare HMO | Attending: Pain Medicine | Admitting: Pain Medicine

## 2017-11-13 ENCOUNTER — Other Ambulatory Visit: Payer: Self-pay

## 2017-11-13 ENCOUNTER — Encounter: Payer: Self-pay | Admitting: Pain Medicine

## 2017-11-13 VITALS — BP 126/87 | HR 102 | Temp 98.4°F | Ht 64.0 in | Wt 219.0 lb

## 2017-11-13 DIAGNOSIS — R51 Headache: Secondary | ICD-10-CM | POA: Insufficient documentation

## 2017-11-13 DIAGNOSIS — M47897 Other spondylosis, lumbosacral region: Secondary | ICD-10-CM | POA: Insufficient documentation

## 2017-11-13 DIAGNOSIS — M48061 Spinal stenosis, lumbar region without neurogenic claudication: Secondary | ICD-10-CM | POA: Diagnosis not present

## 2017-11-13 DIAGNOSIS — Z5181 Encounter for therapeutic drug level monitoring: Secondary | ICD-10-CM | POA: Insufficient documentation

## 2017-11-13 DIAGNOSIS — Z8249 Family history of ischemic heart disease and other diseases of the circulatory system: Secondary | ICD-10-CM | POA: Insufficient documentation

## 2017-11-13 DIAGNOSIS — M47817 Spondylosis without myelopathy or radiculopathy, lumbosacral region: Secondary | ICD-10-CM | POA: Diagnosis not present

## 2017-11-13 DIAGNOSIS — M25512 Pain in left shoulder: Secondary | ICD-10-CM | POA: Insufficient documentation

## 2017-11-13 DIAGNOSIS — G9050 Complex regional pain syndrome I, unspecified: Secondary | ICD-10-CM | POA: Diagnosis not present

## 2017-11-13 DIAGNOSIS — D649 Anemia, unspecified: Secondary | ICD-10-CM | POA: Diagnosis not present

## 2017-11-13 DIAGNOSIS — I130 Hypertensive heart and chronic kidney disease with heart failure and stage 1 through stage 4 chronic kidney disease, or unspecified chronic kidney disease: Secondary | ICD-10-CM | POA: Diagnosis not present

## 2017-11-13 DIAGNOSIS — E1140 Type 2 diabetes mellitus with diabetic neuropathy, unspecified: Secondary | ICD-10-CM | POA: Diagnosis not present

## 2017-11-13 DIAGNOSIS — M545 Low back pain, unspecified: Secondary | ICD-10-CM

## 2017-11-13 DIAGNOSIS — M792 Neuralgia and neuritis, unspecified: Secondary | ICD-10-CM

## 2017-11-13 DIAGNOSIS — F419 Anxiety disorder, unspecified: Secondary | ICD-10-CM | POA: Insufficient documentation

## 2017-11-13 DIAGNOSIS — E785 Hyperlipidemia, unspecified: Secondary | ICD-10-CM | POA: Diagnosis not present

## 2017-11-13 DIAGNOSIS — M542 Cervicalgia: Secondary | ICD-10-CM | POA: Diagnosis not present

## 2017-11-13 DIAGNOSIS — F17210 Nicotine dependence, cigarettes, uncomplicated: Secondary | ICD-10-CM | POA: Insufficient documentation

## 2017-11-13 DIAGNOSIS — Z880 Allergy status to penicillin: Secondary | ICD-10-CM | POA: Insufficient documentation

## 2017-11-13 DIAGNOSIS — I252 Old myocardial infarction: Secondary | ICD-10-CM | POA: Diagnosis not present

## 2017-11-13 DIAGNOSIS — Z7984 Long term (current) use of oral hypoglycemic drugs: Secondary | ICD-10-CM | POA: Insufficient documentation

## 2017-11-13 DIAGNOSIS — M79605 Pain in left leg: Secondary | ICD-10-CM | POA: Diagnosis not present

## 2017-11-13 DIAGNOSIS — N183 Chronic kidney disease, stage 3 (moderate): Secondary | ICD-10-CM | POA: Diagnosis not present

## 2017-11-13 DIAGNOSIS — Z833 Family history of diabetes mellitus: Secondary | ICD-10-CM | POA: Insufficient documentation

## 2017-11-13 DIAGNOSIS — E039 Hypothyroidism, unspecified: Secondary | ICD-10-CM | POA: Insufficient documentation

## 2017-11-13 DIAGNOSIS — Z881 Allergy status to other antibiotic agents status: Secondary | ICD-10-CM | POA: Insufficient documentation

## 2017-11-13 DIAGNOSIS — M25572 Pain in left ankle and joints of left foot: Secondary | ICD-10-CM | POA: Insufficient documentation

## 2017-11-13 DIAGNOSIS — J449 Chronic obstructive pulmonary disease, unspecified: Secondary | ICD-10-CM | POA: Diagnosis not present

## 2017-11-13 DIAGNOSIS — Z79891 Long term (current) use of opiate analgesic: Secondary | ICD-10-CM | POA: Insufficient documentation

## 2017-11-13 DIAGNOSIS — K219 Gastro-esophageal reflux disease without esophagitis: Secondary | ICD-10-CM | POA: Insufficient documentation

## 2017-11-13 DIAGNOSIS — G4730 Sleep apnea, unspecified: Secondary | ICD-10-CM | POA: Insufficient documentation

## 2017-11-13 DIAGNOSIS — M8938 Hypertrophy of bone, other site: Secondary | ICD-10-CM | POA: Insufficient documentation

## 2017-11-13 DIAGNOSIS — G8929 Other chronic pain: Secondary | ICD-10-CM

## 2017-11-13 DIAGNOSIS — M25511 Pain in right shoulder: Secondary | ICD-10-CM | POA: Diagnosis not present

## 2017-11-13 DIAGNOSIS — Z8673 Personal history of transient ischemic attack (TIA), and cerebral infarction without residual deficits: Secondary | ICD-10-CM | POA: Insufficient documentation

## 2017-11-13 DIAGNOSIS — Z6837 Body mass index (BMI) 37.0-37.9, adult: Secondary | ICD-10-CM | POA: Insufficient documentation

## 2017-11-13 DIAGNOSIS — E1122 Type 2 diabetes mellitus with diabetic chronic kidney disease: Secondary | ICD-10-CM | POA: Insufficient documentation

## 2017-11-13 DIAGNOSIS — Z915 Personal history of self-harm: Secondary | ICD-10-CM | POA: Insufficient documentation

## 2017-11-13 DIAGNOSIS — I509 Heart failure, unspecified: Secondary | ICD-10-CM | POA: Diagnosis not present

## 2017-11-13 DIAGNOSIS — M47816 Spondylosis without myelopathy or radiculopathy, lumbar region: Secondary | ICD-10-CM

## 2017-11-13 DIAGNOSIS — G6289 Other specified polyneuropathies: Secondary | ICD-10-CM | POA: Insufficient documentation

## 2017-11-13 DIAGNOSIS — Z882 Allergy status to sulfonamides status: Secondary | ICD-10-CM | POA: Insufficient documentation

## 2017-11-13 DIAGNOSIS — R519 Headache, unspecified: Secondary | ICD-10-CM

## 2017-11-13 DIAGNOSIS — G4486 Cervicogenic headache: Secondary | ICD-10-CM | POA: Insufficient documentation

## 2017-11-13 DIAGNOSIS — M7918 Myalgia, other site: Secondary | ICD-10-CM | POA: Diagnosis not present

## 2017-11-13 DIAGNOSIS — I2510 Atherosclerotic heart disease of native coronary artery without angina pectoris: Secondary | ICD-10-CM | POA: Diagnosis not present

## 2017-11-13 DIAGNOSIS — Z7982 Long term (current) use of aspirin: Secondary | ICD-10-CM | POA: Insufficient documentation

## 2017-11-13 DIAGNOSIS — F319 Bipolar disorder, unspecified: Secondary | ICD-10-CM | POA: Insufficient documentation

## 2017-11-13 DIAGNOSIS — G894 Chronic pain syndrome: Secondary | ICD-10-CM | POA: Diagnosis not present

## 2017-11-13 DIAGNOSIS — Z79890 Hormone replacement therapy (postmenopausal): Secondary | ICD-10-CM | POA: Insufficient documentation

## 2017-11-13 DIAGNOSIS — Z79899 Other long term (current) drug therapy: Secondary | ICD-10-CM | POA: Insufficient documentation

## 2017-11-13 MED ORDER — GABAPENTIN 600 MG PO TABS: 1200.0000 mg | ORAL_TABLET | Freq: Three times a day (TID) | ORAL | 5 refills | Status: DC

## 2017-11-13 MED ORDER — METHOCARBAMOL 750 MG PO TABS: 750.0000 mg | ORAL_TABLET | Freq: Three times a day (TID) | ORAL | 5 refills | Status: DC | PRN

## 2017-11-13 MED ORDER — MAGNESIUM OXIDE -MG SUPPLEMENT 500 MG PO CAPS: 1.0000 | ORAL_CAPSULE | Freq: Two times a day (BID) | ORAL | 1 refills | Status: DC

## 2017-11-13 MED ORDER — TRAMADOL HCL 50 MG PO TABS: 50.0000 mg | ORAL_TABLET | Freq: Four times a day (QID) | ORAL | 2 refills | Status: DC | PRN

## 2017-11-13 NOTE — Patient Instructions (Addendum)
_You have ben given a script for Tramadol today and have been given pre procedure instructions with sedation. ___________________________________________________________________________________________  Muscle Spasms & Cramps  Cause:  The most common cause of muscle spasms and cramps is vitamin and/or electrolyte (calcium, potassium, sodium, etc.) deficiencies.  Possible triggers: Sweating - causes loss of electrolytes thru the skin. Steroids - causes loss of electrolytes thru the urine.  Treatment: 1. Gatorade (or any other electrolyte-replenishing drink) - Take 1, 8 oz glass with each meal (3 times a day). 2. OTC (over-the-counter) Magnesium 400 to 500 mg - Take 1 tablet twice a day (one with breakfast and one before bedtime). If you have kidney problems, talk to your primary care physician before taking any Magnesium. 3. Tonic Water with quinine - Take 1, 8 oz glass before bedtime.   ____________________________________________________________________________________________   ____________________________________________________________________________________________  Preparing for Procedure with Sedation  Instructions: . Oral Intake: Do not eat or drink anything for at least 8 hours prior to your procedure. . Transportation: Public transportation is not allowed. Bring an adult driver. The driver must be physically present in our waiting room before any procedure can be started. Marland Kitchen Physical Assistance: Bring an adult physically capable of assisting you, in the event you need help. This adult should keep you company at home for at least 6 hours after the procedure. . Blood Pressure Medicine: Take your blood pressure medicine with a sip of water the morning of the procedure. . Blood thinners: Notify our staff if you are taking any blood thinners. Depending on which one you take, there will be specific instructions on how and when to stop it. . Diabetics on insulin: Notify the staff so  that you can be scheduled 1st case in the morning. If your diabetes requires high dose insulin, take only  of your normal insulin dose the morning of the procedure and notify the staff that you have done so. . Preventing infections: Shower with an antibacterial soap the morning of your procedure. . Build-up your immune system: Take 1000 mg of Vitamin C with every meal (3 times a day) the day prior to your procedure. Marland Kitchen Antibiotics: Inform the staff if you have a condition or reason that requires you to take antibiotics before dental procedures. . Pregnancy: If you are pregnant, call and cancel the procedure. . Sickness: If you have a cold, fever, or any active infections, call and cancel the procedure. . Arrival: You must be in the facility at least 30 minutes prior to your scheduled procedure. . Children: Do not bring children with you. . Dress appropriately: Bring dark clothing that you would not mind if they get stained. . Valuables: Do not bring any jewelry or valuables.  Procedure appointments are reserved for interventional treatments only. Marland Kitchen No Prescription Refills. . No medication changes will be discussed during procedure appointments. . No disability issues will be discussed.  Reasons to call and reschedule or cancel your procedure: (Following these recommendations will minimize the risk of a serious complication.) . Surgeries: Avoid having procedures within 2 weeks of any surgery. (Avoid for 2 weeks before or after any surgery). . Flu Shots: Avoid having procedures within 2 weeks of a flu shots or . (Avoid for 2 weeks before or after immunizations). . Barium: Avoid having a procedure within 7-10 days after having had a radiological study involving the use of radiological contrast. (Myelograms, Barium swallow or enema study). . Heart attacks: Avoid any elective procedures or surgeries for the initial 6 months after a "  Myocardial Infarction" (Heart Attack). . Blood thinners: It is  imperative that you stop these medications before procedures. Let us know if you if you take any blood thinner.  . Infection: Avoid procedures during or within two weeks of an infection (including chest colds or gastrointestinal problems). Symptoms associated with infections include: Localized redness, fever, chills, night sweats or profuse sweating, burning sensation when voiding, cough, congestion, stuffiness, runny nose, sore throat, diarrhea, nausea, vomiting, cold or Flu symptoms, recent or current infections. It is specially important if the infection is over the area that we intend to treat. Marland Kitchen Heart and lung problems: Symptoms that may suggest an active cardiopulmonary problem include: cough, chest pain, breathing difficulties or shortness of breath, dizziness, ankle swelling, uncontrolled high or unusually low blood pressure, and/or palpitations. If you are experiencing any of these symptoms, cancel your procedure and contact your primary care physician for an evaluation.  Remember:  Regular Business hours are:  Monday to Thursday 8:00 AM to 4:00 PM  Provider's Schedule: Milinda Pointer, MD:  Procedure days: Tuesday and Thursday 7:30 AM to 4:00 PM  Gillis Santa, MD:  Procedure days: Monday and Wednesday 7:30 AM to 4:00 PM ____________________________________________________________________________________________   ____________________________________________________________________________________________  Medication Rules  Applies to: All patients receiving prescriptions (written or electronic).  Pharmacy of record: Pharmacy where electronic prescriptions will be sent. If written prescriptions are taken to a different pharmacy, please inform the nursing staff. The pharmacy listed in the electronic medical record should be the one where you would like electronic prescriptions to be sent.  Prescription refills: Only during scheduled appointments. Applies to both, written and  electronic prescriptions.  NOTE: The following applies primarily to controlled substances (Opioid* Pain Medications).   Patient's responsibilities: 1. Pain Pills: Bring all pain pills to every appointment (except for procedure appointments). 2. Pill Bottles: Bring pills in original pharmacy bottle. Always bring newest bottle. Bring bottle, even if empty. 3. Medication refills: You are responsible for knowing and keeping track of what medications you need refilled. The day before your appointment, write a list of all prescriptions that need to be refilled. Bring that list to your appointment and give it to the admitting nurse. Prescriptions will be written only during appointments. If you forget a medication, it will not be "Called in", "Faxed", or "electronically sent". You will need to get another appointment to get these prescribed. 4. Prescription Accuracy: You are responsible for carefully inspecting your prescriptions before leaving our office. Have the discharge nurse carefully go over each prescription with you, before taking them home. Make sure that your name is accurately spelled, that your address is correct. Check the name and dose of your medication to make sure it is accurate. Check the number of pills, and the written instructions to make sure they are clear and accurate. Make sure that you are given enough medication to last until your next medication refill appointment. 5. Taking Medication: Take medication as prescribed. Never take more pills than instructed. Never take medication more frequently than prescribed. Taking less pills or less frequently is permitted and encouraged, when it comes to controlled substances (written prescriptions).  6. Inform other Doctors: Always inform, all of your healthcare providers, of all the medications you take. 7. Pain Medication from other Providers: You are not allowed to accept any additional pain medication from any other Doctor or Healthcare  provider. There are two exceptions to this rule. (see below) In the event that you require additional pain medication, you are responsible for  notifying us, as stated below. 8. Medication Agreement: You are responsible for carefully reading and following our Medication Agreement. This must be signed before receiving any prescriptions from our practice. Safely store a copy of your signed Agreement. Violations to the Agreement will result in no further prescriptions. (Additional copies of our Medication Agreement are available upon request.) 9. Laws, Rules, & Regulations: All patients are expected to follow all Federal and [employer], [employer], Rules, [employer]. Ignorance of the Laws does not constitute a valid excuse. The use of any illegal substances is prohibited. 10. Adopted CDC guidelines & recommendations: Target dosing levels will be at or below 60 MME/day. Use of benzodiazepines** is not recommended.  Exceptions: There are only two exceptions to the rule of not receiving pain medications from other Healthcare Providers. 1. Exception #1 (Emergencies): In the event of an emergency (i.e.: accident requiring emergency care), you are allowed to receive additional pain medication. However, you are responsible for: As soon as you are able, call our office [phone], at any time of the day or night, and leave a message stating your name, the date and nature of the emergency, and the name and dose of the medication prescribed. In the event that your call is answered by a member of our staff, make sure to document and save the date, time, and the name of the person that took your information.  2. Exception #2 (Planned Surgery): In the event that you are scheduled by another doctor or dentist to have any type of surgery or procedure, you are allowed (for a period no longer than 30 days), to receive additional pain medication, for the acute post-op pain. However, in this case, you are responsible for  picking up a copy of our "Post-op Pain Management for Surgeons" handout, and giving it to your surgeon or dentist. This document is available at our office, and does not require an appointment to obtain it. Simply go to our office during business hours (Monday-Thursday from 8:00 AM to 4:00 PM) (Friday 8:00 AM to 12:00 Noon) or if you have a scheduled appointment with [place], prior to your surgery, and ask for it by name. In addition, you will need to provide [place] with your name, name of your surgeon, type of surgery, and date of procedure or surgery.  *Opioid medications include: morphine, codeine, oxycodone, oxymorphone, hydrocodone, hydromorphone, meperidine, tramadol, tapentadol, buprenorphine, fentanyl, methadone. **Benzodiazepine medications include: diazepam (Valium), alprazolam (Xanax), clonazepam (Klonopine), lorazepam (Ativan), clorazepate (Tranxene), chlordiazepoxide (Librium), estazolam (Prosom), oxazepam (Serax), temazepam (Restoril), triazolam (Halcion) (Last updated: 04/11/2017) ____________________________________________________________________________________________   ____________________________________________________________________________________________  Medication Recommendations and Reminders  Applies to: All patients receiving prescriptions (written and/or electronic).  Medication Rules & Regulations: These rules and regulations exist for your safety and that of others. They are not flexible and neither are we. Dismissing or ignoring them will be considered "non-compliance" with medication therapy, resulting in complete and irreversible termination of such therapy. (See document titled "Medication Rules" for more details.) In all conscience, because of safety reasons, we cannot continue providing a therapy where the patient does not follow instructions.  Pharmacy of record:   Definition: This is the pharmacy where your electronic prescriptions will be sent.   We do not  endorse any particular pharmacy.  You are not restricted in your choice of pharmacy.  The pharmacy listed in the electronic medical record should be the one where you want electronic prescriptions to be sent.  If you choose to change pharmacy, simply notify our nursing  staff of your choice of new pharmacy.  Recommendations:  Keep all of your pain medications in a safe place, under lock and key, even if you live alone.   After you fill your prescription, take 1 week's worth of pills and put them away in a safe place. You should keep a separate, properly labeled bottle for this purpose. The remainder should be kept in the original bottle. Use this as your primary supply, until it runs out. Once it's gone, then you know that you have 1 week's worth of medicine, and it is time to come in for a prescription refill. If you do this correctly, it is unlikely that you will ever run out of medicine.  To make sure that the above recommendation works, it is very important that you make sure your medication refill appointments are scheduled at least 1 week before you run out of medicine. To do this in an effective manner, make sure that you do not leave the office without scheduling your next medication management appointment. Always ask the nursing staff to show you in your prescription , when your medication will be running out. Then arrange for the receptionist to get you a return appointment, at least 7 days before you run out of medicine. Do not wait until you have 1 or 2 pills left, to come in. This is very poor planning and does not take into consideration that we may need to cancel appointments due to bad weather, sickness, or emergencies affecting our staff.  "Partial Fill": If for any reason your pharmacy does not have enough pills/tablets to completely fill or refill your prescription, do not allow for a "partial fill". You will need a separate prescription to fill the remaining amount, which we will  not provide. If the reason for the partial fill is your insurance, you will need to talk to the pharmacist about payment alternatives for the remaining tablets, but again, do not accept a partial fill.  Prescription refills and/or changes in medication(s):   Prescription refills, and/or changes in dose or medication, will be conducted only during scheduled medication management appointments. (Applies to both, written and electronic prescriptions.)  No refills on procedure days. No medication will be changed or started on procedure days. No changes, adjustments, and/or refills will be conducted on a procedure day. Doing so will interfere with the diagnostic portion of the procedure.  No phone refills. No medications will be "called into the pharmacy".  No Fax refills.  No weekend refills.  No Holliday refills.  No after hours refills.  Remember:  Business hours are:  Monday to Thursday 8:00 AM to 4:00 PM Provider's Schedule: Dionisio David, NP - Appointments are:  Medication management: Monday to Thursday 8:00 AM to 4:00 PM Milinda Pointer, MD - Appointments are:  Medication management: Monday and Wednesday 8:00 AM to 4:00 PM Procedure day: Tuesday and Thursday 7:30 AM to 4:00 PM Gillis Santa, MD - Appointments are:  Medication management: Tuesday and Thursday 8:00 AM to 4:00 PM Procedure day: Monday and Wednesday 7:30 AM to 4:00 PM (Last update: 04/11/2017) ____________________________________________________________________________________________   ____________________________________________________________________________________________  CANNABIDIOL (AKA: CBD Oil or Pills)  Applies to: All patients receiving prescriptions of controlled substances (written and/or electronic).  General Information: Cannabidiol (CBD) was discovered in 24. It is one of some 113 identified cannabinoids in cannabis (Marijuana) plants, accounting for up to 40% of the plant's extract. As of  2018, preliminary clinical research on cannabidiol included studies of anxiety, cognition, movement disorders, and  pain.  Cannabidiol is consummed in multiple ways, including inhalation of cannabis smoke or vapor, as an aerosol spray into the cheek, and by mouth. It may be supplied as CBD oil containing CBD as the active ingredient (no added tetrahydrocannabinol (THC) or terpenes), a full-plant CBD-dominant hemp extract oil, capsules, dried cannabis, or as a liquid solution. CBD is thought not have the same psychoactivity as THC, and may affect the actions of THC. Studies suggest that CBD may interact with different biological targets, including cannabinoid receptors and other neurotransmitter receptors. As of 2018 the mechanism of action for its biological effects has not been determined.  In the [place], cannabidiol has a limited approval by the Food and Drug Administration (FDA) for treatment of only two types of epilepsy disorders. The side effects of long-term use of the drug include somnolence, decreased appetite, diarrhea, fatigue, malaise, weakness, sleeping problems, and others.  CBD remains a Schedule I drug prohibited for any use.  Legality: Some manufacturers ship CBD products nationally, an illegal action which the FDA has not enforced in 2018, with CBD remaining the subject of an FDA investigational new drug evaluation, and is not considered legal as a dietary supplement or food ingredient as of December 2018. Federal illegality has made it difficult historically to conduct research on CBD. CBD is openly sold in head shops and health food stores in some states where such sales have not been explicitly legalized.  Warning: Because it is not FDA approved for general use or treatment of pain, it is not required to undergo the same manufacturing controls as prescription drugs.  This means that the available cannabidiol (CBD) may be contaminated with THC.  If this is the case, it will  trigger a positive urine drug screen (UDS) test for cannabinoids (Marijuana).  Because a positive UDS for illicit substances is a violation of our medication agreement, your opioid analgesics (pain medicine) may be permanently discontinued. (Last update: 05/02/2017) ____________________________________________________________________________________________   Preparing for Procedure with Sedation Instructions: . Oral Intake: Do not eat or drink anything for at least 8 hours prior to your procedure. . Transportation: Public transportation is not allowed. Bring an adult driver. The driver must be physically present in our waiting room before any procedure can be started. Marland Kitchen Physical Assistance: Bring an adult capable of physically assisting you, in the event you need help. . Blood Pressure Medicine: Take your blood pressure medicine with a sip of water the morning of the procedure. . Insulin: Take only  of your normal insulin dose. . Preventing infections: Shower with an antibacterial soap the morning of your procedure. . Build-up your immune system: Take 1000 mg of Vitamin C with every meal (3 times a day) the day prior to your procedure. . Pregnancy: If you are pregnant, call and cancel the procedure. . Sickness: If you have a cold, fever, or any active infections, call and cancel the procedure. . Arrival: You must be in the facility at least 30 minutes prior to your scheduled procedure. . Children: Do not bring children with you. . Dress appropriately: Bring dark clothing that you would not mind if they get stained. . Valuables: Do not bring any jewelry or valuables. Procedure appointments are reserved for interventional treatments only. Marland Kitchen No Prescription Refills. . No medication changes will be discussed during procedure appointments. No disability issues will be discussed.Facet Blocks Patient Information  Description: The facets are joints in the spine between the vertebrae.  Like any  joints in the body, facets  can become irritated and painful.  Arthritis can also effect the facets.  By injecting steroids and local anesthetic in and around these joints, we can temporarily block the nerve supply to them.  Steroids act directly on irritated nerves and tissues to reduce selling and inflammation which often leads to decreased pain.  Facet blocks may be done anywhere along the spine from the neck to the low back depending upon the location of your pain.   After numbing the skin with local anesthetic (like Novocaine), a small needle is passed onto the facet joints under x-ray guidance.  You may experience a sensation of pressure while this is being done.  The entire block usually lasts about 15-25 minutes.   Conditions which may be treated by facet blocks:  Low back/buttock pain Neck/shoulder pain Certain types of headaches  Preparation for the injection:  Do not eat any solid food or dairy products within 8 hours of your appointment. You may drink clear liquid up to 3 hours before appointment.  Clear liquids include water, black coffee, juice or soda.  No milk or cream please. You may take your regular medication, including pain medications, with a sip of water before your appointment.  Diabetics should hold regular insulin (if taken separately) and take 1/2 normal NPH dose the morning of the procedure.  Carry some sugar containing items with you to your appointment. A driver must accompany you and be prepared to drive you home after your procedure. Bring all your current medications with you. An IV may be inserted and sedation may be given at the discretion of the physician. A blood pressure cuff, EKG and other monitors will often be applied during the procedure.  Some patients may need to have extra oxygen administered for a short period. You will be asked to provide medical information, including your allergies and medications, prior to the procedure.  We must know immediately if  you are taking blood thinners (like Coumadin/Warfarin) or if you are allergic to IV iodine contrast (dye).  We must know if you could possible be pregnant.  Possible side-effects:  Bleeding from needle site Infection (rare, may require surgery) Nerve injury (rare) Numbness & tingling (temporary) Difficulty urinating (rare, temporary) Spinal headache (a headache worse with upright posture) Light-headedness (temporary) Pain at injection site (serveral days) Decreased blood pressure (rare, temporary) Weakness in arm/leg (temporary) Pressure sensation in back/neck (temporary)   Call if you experience:  Fever/chills associated with headache or increased back/neck pain Headache worsened by an upright position New onset, weakness or numbness of an extremity below the injection site Hives or difficulty breathing (go to the emergency room) Inflammation or drainage at the injection site(s) Severe back/neck pain greater than usual New symptoms which are concerning to you  Please note:  Although the local anesthetic injected can often make your back or neck feel good for several hours after the injection, the pain will likely return. It takes 3-7 days for steroids to work.  You may not notice any pain relief for at least one week.  If effective, we will often do a series of 2-3 injections spaced 3-6 weeks apart to maximally decrease your pain.  After the initial series, you may be a candidate for a more permanent nerve block of the facets.  If you have any questions, please call [phone] . [facility]

## 2017-11-13 NOTE — Progress Notes (Signed)
Nursing Pain Medication Assessment:  Safety precautions to be maintained throughout the outpatient stay will include: orient to surroundings, keep bed in low position, maintain call bell within reach at all times, provide assistance with transfer out of bed and ambulation.  Medication Inspection Compliance: Pill count conducted under aseptic conditions, in front of the patient. Neither the pills nor the bottle was removed from the patient's sight at any time. Once count was completed pills were immediately returned to the patient in their original bottle.  Medication: Tramadol (Ultram) Pill/Patch Count: 50 of 120 pills remain Pill/Patch Appearance: Markings consistent with prescribed medication Bottle Appearance: Standard pharmacy container. Clearly labeled. Filled Date: 08 / 22 / 2019 Last Medication intake:  Yesterday

## 2017-11-13 NOTE — Progress Notes (Signed)
Patient's Name: Marie Clarke  MRN: [id number]  Referring Provider: Sharyne Peach, MD  DOB: Jun 18, 1970  PCP: Sharyne Peach, MD  DOS: 11/13/2017  Note by: Gaspar Cola, MD  Service setting: Ambulatory outpatient  Specialty: Interventional Pain Management  Location: [facility] (AMB) Pain Management Facility    Patient type: Established   Primary Reason(s) for Visit: Encounter for post-procedure evaluation of chronic illness with mild to moderate exacerbation CC: Back Pain (lower)  HPI  Marie Clarke is a 47 y.o. year old, female patient, who comes today for a post-procedure evaluation. She has Affective bipolar disorder ([place]); Arteriosclerosis of coronary artery; CCF (congestive cardiac failure) ([place]); Chronic kidney disease, stage III (moderate) ([place]); Chronic kidney disease; Controlled diabetes mellitus type II without complication ([place]); Essential (primary) hypertension; Polypharmacy; Adult hypothyroidism; Detrusor muscle hypertonia; Algodystrophic syndrome; Apnea, sleep; Temporary cerebral vascular dysfunction; Rectal prolapse; Rectal bleeding; Rectal bleed; Detrusor dyssynergia; Diabetes mellitus, type 2 ([place]); Abdominal wall abscess; Bipolar affective disorder ([place]); Congestive heart failure ([place]); Controlled type 2 diabetes mellitus without complication ([place]); Other long term (current) drug therapy; Type 2 diabetes mellitus ([place]); Abnormal gait; Neurosis, posttraumatic; Chronic pain; Chronic low back pain (Primary Area of Pain) (Bilateral) (L>R); Chronic lower extremity pain (Referred) (Secondary Area of Pain) (Left); Abdominal wound dehiscence; Encounter for pain management planning; Morbid obesity ([place]); Abnormal CT scan, lumbar spine; Lumbar facet hypertrophy; Lumbar facet syndrome (Bilateral) (L>R); Lumbar foraminal stenosis (Bilateral) (L5-S1); Chronic ankle pain [facility] Area of Pain) (Left); Neurogenic pain; Neuropathic pain; Myofascial pain; History of suicide attempt; Incidental lung nodule;  Contusion of knee (Left); Strain of knee (Left); Hypotension; Sepsis ([place]); Osteopenia; Strain of extensor muscle, fascia and tendon of left index finger at wrist and hand level, initial encounter; Tobacco use disorder; Ventral hernia without obstruction or gangrene; Chronic neck pain (Fourth Area of Pain) (Bilateral) (L>R); Pharmacologic therapy; Disorder of skeletal system; Problems influencing health status; Long term current use of opiate analgesic; Elevated C-reactive protein (CRP); Elevated sed rate; Chronic pain syndrome; Spondylosis without myelopathy or radiculopathy, lumbosacral region; Chronic musculoskeletal pain; Chronic shoulder pain (Fifth Area of Pain) (Bilateral) (L>R); CRPS (complex regional pain syndrome) type 1 of lower limb (left ankle); Ankle joint instability (Left); Ankle sprain, sequela (Left); History of psychiatric symptoms; History of postoperative nausea; Disease related peripheral neuropathy; Gastroesophageal reflux disease without esophagitis; Occipital headache (Bilateral); and Cervicogenic headache (Bilateral) (L>R) on their problem list. Her primarily concern today is the Back Pain (lower)  Pain Assessment: Location: Lower Back Radiating: down left leg to foot Onset: More than a month ago Duration: Chronic pain Quality: Burning, Dull, Aching Severity: 3 /10 (subjective, self-reported pain score)  Note: Reported level is compatible with observation.                         When using our objective Pain Scale, levels between 6 and 10/10 are said to belong in an emergency room, as it progressively worsens from a 6/10, described as severely limiting, requiring emergency care not usually available at an outpatient pain management facility. At a 6/10 level, communication becomes difficult and requires great effort. Assistance to reach the emergency department may be required. Facial flushing and profuse sweating along with potentially dangerous increases in heart rate and blood  pressure will be evident. Effect on ADL: limits my activities Timing: Constant Modifying factors: tramadol BP: 126/87  HR: (!) 102  Marie Clarke comes in today for post-procedure evaluation after the treatment done on 10/23/2017.  Further  details on both, my assessment(s), as well as the proposed treatment plan, please see below.  Post-Procedure Assessment  10/22/2017 Procedure: Diagnostic bilateral lumbar facet block #1 under fluoroscopic guidance and IV sedation  Pre-procedure pain score:  7/10 Post-procedure pain score: 0/10 (100% relief) Influential Factors: BMI: 37.59 kg/m Intra-procedural challenges: None observed.         Assessment challenges: None detected.              Reported side-effects: None.        Post-procedural adverse reactions or complications: None reported         Sedation: Sedation provided. When no sedatives are used, the analgesic levels obtained are directly associated to the effectiveness of the local anesthetics. However, when sedation is provided, the level of analgesia obtained during the initial 1 hour following the intervention, is believed to be the result of a combination of factors. These factors may include, but are not limited to: 1. The effectiveness of the local anesthetics used. 2. The effects of the analgesic(s) and/or anxiolytic(s) used. 3. The degree of discomfort experienced by the patient at the time of the procedure. 4. The patients ability and reliability in recalling and recording the events. 5. The presence and influence of possible secondary gains and/or psychosocial factors. Reported result: Relief experienced during the 1st hour after the procedure: 100 % (Ultra-Short Term Relief)            Interpretative annotation: Clinically appropriate result. Analgesia during this period is likely to be Local Anesthetic and/or IV Sedative (Analgesic/Anxiolytic) related.          Effects of local anesthetic: The analgesic effects attained during this  period are directly associated to the localized infiltration of local anesthetics and therefore cary significant diagnostic value as to the etiological location, or anatomical origin, of the pain. Expected duration of relief is directly dependent on the pharmacodynamics of the local anesthetic used. Long-acting (4-6 hours) anesthetics used.  Reported result: Relief during the next 4 to 6 hour after the procedure: 100 %(lasted for 5 days) (Short-Term Relief)            Interpretative annotation: Clinically appropriate result. Analgesia during this period is likely to be Local Anesthetic-related.          Long-term benefit: Defined as the period of time past the expected duration of local anesthetics (1 hour for short-acting and 4-6 hours for long-acting). With the possible exception of prolonged sympathetic blockade from the local anesthetics, benefits during this period are typically attributed to, or associated with, other factors such as analgesic sensory neuropraxia, antiinflammatory effects, or beneficial biochemical changes provided by agents other than the local anesthetics.  Reported result: Extended relief following procedure: 70 % (Long-Term Relief)            Interpretative annotation: Clinically possible results. Good relief. No permanent benefit expected. Inflammation plays a part in the etiology to the pain.          Current benefits: Defined as reported results that persistent at this point in time.   Analgesia: <75 % Ms. Yeats reports the extremity pain improved more than the axial pain. Leg pain is now gone. Function: Somewhat improved ROM: Somewhat improved Interpretative annotation: Ongoing benefit. Therapeutic benefit observed. Effective therapeutic approach.          Interpretation: Results would suggest a successful diagnostic intervention.                  Plan:  Set up  procedure as a PRN palliative treatment option for this patient.                Laboratory Chemistry   Inflammation Markers (CRP: Acute Phase) (ESR: Chronic Phase) Lab Results  Component Value Date   CRP 21 (H) 09/09/2017   ESRSEDRATE 50 (H) 09/09/2017   LATICACIDVEN 1.9 04/15/2017                         Renal Markers Lab Results  Component Value Date   BUN 12 09/27/2017   CREATININE 0.66 09/27/2017   BCR 16 09/09/2017   GFRAA >60 09/27/2017   GFRNONAA >60 09/27/2017                             Hepatic Markers Lab Results  Component Value Date   AST 16 09/09/2017   ALT 14 08/10/2017   ALBUMIN 4.6 09/09/2017                        Neuropathy Markers Lab Results  Component Value Date   EXNTZGYF74 944 09/09/2017   HIV Non Reactive 09/18/2016                        Hematology Parameters Lab Results  Component Value Date   INR 0.92 09/20/2016   LABPROT 12.3 09/20/2016   APTT <24 (L) 09/20/2016   PLT 259 09/27/2017   HGB 12.3 09/27/2017   HCT 36.8 09/27/2017                        CV Markers Lab Results  Component Value Date   BNP 24.0 07/04/2016   TROPONINI <0.03 09/27/2017                         Note: Lab results reviewed.  Recent Imaging Results   Results for orders placed in visit on 10/22/17  DG C-Arm 1-60 Min-No Report   Narrative Fluoroscopy was utilized by the requesting physician.  No radiographic  interpretation.    Interpretation Report: Fluoroscopy was used during the procedure to assist with needle guidance. The images were interpreted intraoperatively by the requesting physician.  Meds   Current Outpatient Medications:  .  acetaminophen (TYLENOL) 325 MG tablet, Take 1 tablet (325 mg total) by mouth every 6 (six) hours as needed for mild pain (or Fever >/= 101)., Disp: , Rfl:  .  aspirin EC 81 MG tablet, Take 81 mg by mouth daily at 12 noon., Disp: , Rfl:  .  atorvastatin (LIPITOR) 40 MG tablet, Take 40 mg by mouth at bedtime. , Disp: , Rfl:  .  azelastine (ASTELIN) 0.1 % nasal spray, Place 1 spray into both nostrils daily., Disp: , Rfl:   .  busPIRone (BUSPAR) 10 MG tablet, Take 2 tablets (20 mg total) by mouth 3 (three) times daily. (Patient taking differently: Take 15 mg by mouth 3 (three) times daily. ), Disp: 540 tablet, Rfl: 1 .  gabapentin (NEURONTIN) 600 MG tablet, Take 2 tablets (1,200 mg total) by mouth 3 (three) times daily., Disp: 180 tablet, Rfl: 5 .  hydrOXYzine (ATARAX/VISTARIL) 10 MG tablet, Take 1-3 tablets (10-30 mg total) by mouth 2 (two) times daily as needed (for severe anxiety sx)., Disp: 300 tablet, Rfl: 0 .  KLOR-CON M20 20 MEQ tablet, Take 20  mEq by mouth daily. , Disp: , Rfl:  .  lamoTRIgine (LAMICTAL) 25 MG tablet, Take 1-2 tablets (25-50 mg total) by mouth daily. Take 25 mg for 2 weeks and increase to 50 mg, Disp: 180 tablet, Rfl: 0 .  levocetirizine (XYZAL) 5 MG tablet, Take 5 mg by mouth every evening. , Disp: , Rfl:  .  levothyroxine (SYNTHROID, LEVOTHROID) 200 MCG tablet, Take 200 mcg by mouth daily. , Disp: , Rfl:  .  metFORMIN (GLUCOPHAGE) 500 MG tablet, , Disp: , Rfl:  .  montelukast (SINGULAIR) 10 MG tablet, Take 10 mg by mouth daily. , Disp: , Rfl:  .  omega-3 fish oil (MAXEPA) 1000 MG CAPS capsule, , Disp: , Rfl:  .  promethazine (PHENERGAN) 25 MG tablet, Take 1 tablet (25 mg total) by mouth every 6 (six) hours as needed for nausea or vomiting., Disp: 20 tablet, Rfl: 0 .  QUEtiapine (SEROQUEL) 25 MG tablet, TAKE 3 TABLETS (75 MG TOTAL) BY MOUTH ATBEDTIME, Disp: 270 tablet, Rfl: 0 .  TRELEGY ELLIPTA 100-62.5-25 MCG/INH AEPB, , Disp: , Rfl:  .  bumetanide (BUMEX) 2 MG tablet, Take 1 tablet (2 mg total) by mouth 2 (two) times daily., Disp: 60 tablet, Rfl: 11 .  levothyroxine (SYNTHROID, LEVOTHROID) 25 MCG tablet, Take 25 mcg by mouth daily before breakfast. , Disp: , Rfl:  .  Magnesium Oxide 500 MG CAPS, Take 1 capsule (500 mg total) by mouth 2 (two) times daily at 8 am and 10 pm., Disp: 180 capsule, Rfl: 1 .  methocarbamol (ROBAXIN) 750 MG tablet, Take 1 tablet (750 mg total) by mouth every 8  (eight) hours as needed for muscle spasms., Disp: 90 tablet, Rfl: 5 .  traMADol (ULTRAM) 50 MG tablet, Take 1 tablet (50 mg total) by mouth every 6 (six) hours as needed for severe pain., Disp: 90 tablet, Rfl: 2  ROS  Constitutional: Denies any fever or chills Gastrointestinal: No reported hemesis, hematochezia, vomiting, or acute GI distress Musculoskeletal: Denies any acute onset joint swelling, redness, loss of ROM, or weakness Neurological: No reported episodes of acute onset apraxia, aphasia, dysarthria, agnosia, amnesia, paralysis, loss of coordination, or loss of consciousness  Allergies  Ms. Corkern is allergic to diazepam; levofloxacin; metronidazole; pregabalin; sulfa antibiotics; cephalexin; ciprofloxacin; ziprasidone hcl; and penicillins.  PFSH  Drug: Ms. Pennebaker  reports that she does not use drugs. Alcohol:  reports that she does not drink alcohol. Tobacco:  reports that she has been smoking cigarettes. She has been smoking about 1.00 pack per day. She has never used smokeless tobacco. Medical:  has a past medical history of Anemia, Anxiety, Asthma, Bipolar disorder ([place]), CAD (coronary artery disease) ([place]), CHF (congestive heart failure) ([place]), COPD (chronic obstructive pulmonary disease) ([place]), Depression ([place]), Diabetes mellitus without complication ([place]), Diabetes mellitus, type II ([place]), Drug overdose, GERD (gastroesophageal reflux disease), Headache, Hyperlipidemia, Hypertension, Left leg pain (04/29/2014), MI (myocardial infarction) ([place]), Muscle ache (09/16/2014), Osteoporosis, Overactive bladder, Pancreatitis ([place]), Reflex sympathetic dystrophy, Renal insufficiency, Sleep apnea, Sleep apnea, Stroke ([place]), Thyroid disease, TIA (transient ischemic attack) ([place]), and TIA (transient ischemic attack). Surgical: Ms. Mosquera  has a past surgical history that includes prolapse rectum surgery (N/A, July 2016); Abdominal hysterectomy; Tonsillectomy; and Hernia repair (07/15/2017). Family: family  history includes Anxiety disorder in her father and mother; Bipolar disorder in her father and mother; CAD in her mother; Depression in her father and mother; Diabetes Mellitus II in her mother; Hypertension in her father; Osteoarthritis in her mother; Osteoporosis in her  mother; Post-traumatic stress disorder in her sister; Sleep apnea in her mother.  Constitutional Exam  General appearance: Well nourished, well developed, and well hydrated. In no apparent acute distress Vitals:   11/13/17 0820  BP: 126/87  Pulse: (!) 102  Temp: 98.4 F (36.9 C)  SpO2: 98%  Weight: 219 lb (99.3 kg)  Height: _0  (1.626 m)   BMI Assessment: Estimated body mass index is 37.59 kg/m as calculated from the following:   Height as of this encounter: _1  (1.626 m).   Weight as of this encounter: 219 lb (99.3 kg).  BMI interpretation table: BMI level Category Range association with higher incidence of chronic pain  <18 kg/m2 Underweight   18.5-24.9 kg/m2 Ideal body weight   25-29.9 kg/m2 Overweight Increased incidence by 20%  30-34.9 kg/m2 Obese (Class I) Increased incidence by 68%  35-39.9 kg/m2 Severe obesity (Class II) Increased incidence by 136%  >40 kg/m2 Extreme obesity (Class III) Increased incidence by 254%   Patient's current BMI Ideal Body weight  Body mass index is 37.59 kg/m. Ideal body weight: 54.7 kg (120 lb 9.5 oz) Adjusted ideal body weight: 72.6 kg (159 lb 15.3 oz)   BMI Readings from Last 4 Encounters:  11/13/17 37.59 kg/m  10/22/17 38.62 kg/m  10/02/17 38.62 kg/m  09/27/17 38.62 kg/m   Wt Readings from Last 4 Encounters:  11/13/17 219 lb (99.3 kg)  10/22/17 225 lb (102.1 kg)  10/02/17 225 lb (102.1 kg)  09/27/17 225 lb (102.1 kg)  Psych/Mental status: Alert, oriented x 3 (person, place, & time)       Eyes: PERLA Respiratory: No evidence of acute respiratory distress  Cervical Spine Area Exam  Skin & Axial Inspection: No masses, redness, edema, swelling, or associated  skin lesions Alignment: Symmetrical Functional ROM: Unrestricted ROM      Stability: No instability detected Muscle Tone/Strength: Functionally intact. No obvious neuro-muscular anomalies detected. Sensory (Neurological): Unimpaired Palpation: No palpable anomalies              Upper Extremity (UE) Exam    Side: Right upper extremity  Side: Left upper extremity  Skin & Extremity Inspection: Skin color, temperature, and hair growth are WNL. No peripheral edema or cyanosis. No masses, redness, swelling, asymmetry, or associated skin lesions. No contractures.  Skin & Extremity Inspection: Skin color, temperature, and hair growth are WNL. No peripheral edema or cyanosis. No masses, redness, swelling, asymmetry, or associated skin lesions. No contractures.  Functional ROM: Unrestricted ROM          Functional ROM: Unrestricted ROM          Muscle Tone/Strength: Functionally intact. No obvious neuro-muscular anomalies detected.  Muscle Tone/Strength: Functionally intact. No obvious neuro-muscular anomalies detected.  Sensory (Neurological): Unimpaired          Sensory (Neurological): Unimpaired          Palpation: No palpable anomalies              Palpation: No palpable anomalies              Provocative Test(s):  Phalen's test: deferred Tinel's test: deferred Apley's scratch test (touch opposite shoulder):  Action 1 (Across chest): deferred Action 2 (Overhead): deferred Action 3 (LB reach): deferred   Provocative Test(s):  Phalen's test: deferred Tinel's test: deferred Apley's scratch test (touch opposite shoulder):  Action 1 (Across chest): deferred Action 2 (Overhead): deferred Action 3 (LB reach): deferred    Thoracic Spine Area Exam  Skin & Axial  Inspection: No masses, redness, or swelling Alignment: Symmetrical Functional ROM: Unrestricted ROM Stability: No instability detected Muscle Tone/Strength: Functionally intact. No obvious neuro-muscular anomalies detected. Sensory  (Neurological): Unimpaired Muscle strength & Tone: No palpable anomalies  Lumbar Spine Area Exam  Skin & Axial Inspection: No masses, redness, or swelling Alignment: Symmetrical Functional ROM: Improved after treatment       Stability: No instability detected Muscle Tone/Strength: Functionally intact. No obvious neuro-muscular anomalies detected. Sensory (Neurological): Unimpaired Palpation: No palpable anomalies       Provocative Tests: Hyperextension/rotation test: Improved after treatment       Lumbar quadrant test (Kemp's test): Improved after treatment       Lateral bending test: deferred today       Patrick's Maneuver: deferred today                   FABER test: deferred today                   S-I anterior distraction/compression test: deferred today         S-I lateral compression test: deferred today         S-I Thigh-thrust test: deferred today         S-I Gaenslen's test: deferred today          Gait & Posture Assessment  Ambulation: Unassisted Gait: Relatively normal for age and body habitus Posture: WNL   Lower Extremity Exam    Side: Right lower extremity  Side: Left lower extremity  Stability: No instability observed          Stability: No instability observed          Skin & Extremity Inspection: Skin color, temperature, and hair growth are WNL. No peripheral edema or cyanosis. No masses, redness, swelling, asymmetry, or associated skin lesions. No contractures.  Skin & Extremity Inspection: Skin color, temperature, and hair growth are WNL. No peripheral edema or cyanosis. No masses, redness, swelling, asymmetry, or associated skin lesions. No contractures.  Functional ROM: Unrestricted ROM                  Functional ROM: Unrestricted ROM                  Muscle Tone/Strength: Functionally intact. No obvious neuro-muscular anomalies detected.  Muscle Tone/Strength: Functionally intact. No obvious neuro-muscular anomalies detected.  Sensory (Neurological):  Unimpaired  Sensory (Neurological): Unimpaired  Palpation: No palpable anomalies  Palpation: No palpable anomalies   Assessment  Primary Diagnosis & Pertinent Problem List: The primary encounter diagnosis was Chronic low back pain (Primary Area of Pain) (Bilateral) (L>R). Diagnoses of Chronic lower extremity pain (Secondary Area of Pain) (Left), Chronic ankle pain (Tertiary Area of Pain) (Left), Chronic neck pain (Fourth Area of Pain) (Bilateral) (L>R), Chronic shoulder pain (Fifth Area of Pain) (Bilateral) (L>R), Spondylosis without myelopathy or radiculopathy, lumbosacral region, Lumbar facet syndrome (Bilateral) (L>R), Lumbar facet hypertrophy, Chronic musculoskeletal pain, Neurogenic pain, Disease related peripheral neuropathy, Gastroesophageal reflux disease without esophagitis, Chronic pain syndrome, Occipital headache (Bilateral), and Cervicogenic headache (Bilateral) were also pertinent to this visit.  Status Diagnosis  Improving Resolved Controlled 1. Chronic low back pain (Primary Area of Pain) (Bilateral) (L>R)   2. Chronic lower extremity pain (Secondary Area of Pain) (Left)   3. Chronic ankle pain [facility] Area of Pain) (Left)   4. Chronic neck pain (Fourth Area of Pain) (Bilateral) (L>R)   5. Chronic shoulder pain (Fifth Area of Pain) (Bilateral) (L>R)  6. Spondylosis without myelopathy or radiculopathy, lumbosacral region   7. Lumbar facet syndrome (Bilateral) (L>R)   8. Lumbar facet hypertrophy   9. Chronic musculoskeletal pain   10. Neurogenic pain   11. Disease related peripheral neuropathy   12. Gastroesophageal reflux disease without esophagitis   13. Chronic pain syndrome   14. Occipital headache (Bilateral)   15. Cervicogenic headache (Bilateral)     Problems updated and reviewed during this visit: No problems updated. Plan of Care  Pharmacotherapy (Medications Ordered): Meds ordered this encounter  Medications  . methocarbamol (ROBAXIN) 750 MG tablet    Sig:  Take 1 tablet (750 mg total) by mouth every 8 (eight) hours as needed for muscle spasms.    Dispense:  90 tablet    Refill:  5    Do not place this medication, or any other prescription from our practice, on "Automatic Refill". Patient may have prescription filled one day early if pharmacy is closed on scheduled refill date.  . gabapentin (NEURONTIN) 600 MG tablet    Sig: Take 2 tablets (1,200 mg total) by mouth 3 (three) times daily.    Dispense:  180 tablet    Refill:  5  . Magnesium Oxide 500 MG CAPS    Sig: Take 1 capsule (500 mg total) by mouth 2 (two) times daily at 8 am and 10 pm.    Dispense:  180 capsule    Refill:  1    Do not place medication on "Automatic Refill". Fill one day early if pharmacy is closed on scheduled refill date.  . traMADol (ULTRAM) 50 MG tablet    Sig: Take 1 tablet (50 mg total) by mouth every 6 (six) hours as needed for severe pain.    Dispense:  90 tablet    Refill:  2    Medication for Chronic Pain (G89.4). [place] STOP ACT - Not applicable. Fill one day early if pharmacy is closed on scheduled refill date.  Do not fill until: 11/13/17 To last until: 02/11/18   Medications administered today: [facility] had no medications administered during this visit.   Procedure Orders     LUMBAR FACET(MEDIAL BRANCH NERVE BLOCK) [facility]     LUMBAR FACET(MEDIAL BRANCH NERVE BLOCK) [facility] Lab Orders  No laboratory test(s) ordered today   Imaging Orders  No imaging studies ordered today   Referral Orders  No referral(s) requested today   Interventional management options: Planned, scheduled, and/or pending:   Diagnostic bilateral lumbar facet block #2 under fluoroscopic guidance and IV sedation    Considering:   Diagnosticbilateral lumbar facet nerve block  Possible bilateral lumbar facet RFA  Diagnostic left-sided L5-S1 transforaminal ESI  Diagnostic left-sided L5 selective nerve root block  Possible left-sided L5 DRG RFA  Diagnostic left-sided lumbar  sympathetic nerve block  Possible left-sided lumbar sympathetic RFA  Diagnostic bilateral cervical facet nerve block  Possible bilateral cervical facet RFA  Diagnosticleft intra-articular shoulder injection  Diagnosticleft suprascapular nerve block  Diagnostic left suprascapular RFA  Possible lumbar spinal cord stimulator trial    Palliative PRN treatment(s):   None at this time   Provider-requested follow-up: Return for Procedure (w/ sedation): (B) L-FCT BLK #2.  Future Appointments  Date Time Provider [place]  01/01/2018  8:30 AM Ursula Alert, MD [facility] None  01/22/2018  8:45 AM Vevelyn Francois, NP [facility] [place] None   Primary Care Physician: Sharyne Peach, MD Location: [facility] Outpatient Pain Management Facility Note by: Gaspar Cola, MD Date: 11/13/2017; Time: 9:19  AM

## 2017-11-14 DIAGNOSIS — I2510 Atherosclerotic heart disease of native coronary artery without angina pectoris: Secondary | ICD-10-CM | POA: Diagnosis not present

## 2017-11-15 DIAGNOSIS — I2510 Atherosclerotic heart disease of native coronary artery without angina pectoris: Secondary | ICD-10-CM | POA: Diagnosis not present

## 2017-11-15 DIAGNOSIS — G4733 Obstructive sleep apnea (adult) (pediatric): Secondary | ICD-10-CM | POA: Diagnosis not present

## 2017-11-16 DIAGNOSIS — I2510 Atherosclerotic heart disease of native coronary artery without angina pectoris: Secondary | ICD-10-CM | POA: Diagnosis not present

## 2017-11-17 DIAGNOSIS — I2510 Atherosclerotic heart disease of native coronary artery without angina pectoris: Secondary | ICD-10-CM | POA: Diagnosis not present

## 2017-11-18 ENCOUNTER — Encounter: Payer: Self-pay | Admitting: Pain Medicine

## 2017-11-18 DIAGNOSIS — G4733 Obstructive sleep apnea (adult) (pediatric): Secondary | ICD-10-CM | POA: Diagnosis not present

## 2017-11-18 DIAGNOSIS — I2510 Atherosclerotic heart disease of native coronary artery without angina pectoris: Secondary | ICD-10-CM | POA: Diagnosis not present

## 2017-11-19 DIAGNOSIS — I2510 Atherosclerotic heart disease of native coronary artery without angina pectoris: Secondary | ICD-10-CM | POA: Diagnosis not present

## 2017-11-20 DIAGNOSIS — I2510 Atherosclerotic heart disease of native coronary artery without angina pectoris: Secondary | ICD-10-CM | POA: Diagnosis not present

## 2017-11-21 DIAGNOSIS — I2510 Atherosclerotic heart disease of native coronary artery without angina pectoris: Secondary | ICD-10-CM | POA: Diagnosis not present

## 2017-11-22 DIAGNOSIS — I2510 Atherosclerotic heart disease of native coronary artery without angina pectoris: Secondary | ICD-10-CM | POA: Diagnosis not present

## 2017-11-23 DIAGNOSIS — I2510 Atherosclerotic heart disease of native coronary artery without angina pectoris: Secondary | ICD-10-CM | POA: Diagnosis not present

## 2017-11-24 DIAGNOSIS — I2510 Atherosclerotic heart disease of native coronary artery without angina pectoris: Secondary | ICD-10-CM | POA: Diagnosis not present

## 2017-11-25 DIAGNOSIS — I2510 Atherosclerotic heart disease of native coronary artery without angina pectoris: Secondary | ICD-10-CM | POA: Diagnosis not present

## 2017-11-26 DIAGNOSIS — I2510 Atherosclerotic heart disease of native coronary artery without angina pectoris: Secondary | ICD-10-CM | POA: Diagnosis not present

## 2017-11-27 DIAGNOSIS — I2510 Atherosclerotic heart disease of native coronary artery without angina pectoris: Secondary | ICD-10-CM | POA: Diagnosis not present

## 2017-11-27 DIAGNOSIS — N3941 Urge incontinence: Secondary | ICD-10-CM | POA: Diagnosis not present

## 2017-11-28 DIAGNOSIS — M2550 Pain in unspecified joint: Secondary | ICD-10-CM | POA: Diagnosis not present

## 2017-11-28 DIAGNOSIS — N183 Chronic kidney disease, stage 3 (moderate): Secondary | ICD-10-CM | POA: Diagnosis not present

## 2017-11-28 DIAGNOSIS — I2510 Atherosclerotic heart disease of native coronary artery without angina pectoris: Secondary | ICD-10-CM | POA: Diagnosis not present

## 2017-11-28 DIAGNOSIS — R252 Cramp and spasm: Secondary | ICD-10-CM | POA: Diagnosis not present

## 2017-11-29 DIAGNOSIS — I2510 Atherosclerotic heart disease of native coronary artery without angina pectoris: Secondary | ICD-10-CM | POA: Diagnosis not present

## 2017-11-30 DIAGNOSIS — I2510 Atherosclerotic heart disease of native coronary artery without angina pectoris: Secondary | ICD-10-CM | POA: Diagnosis not present

## 2017-12-01 DIAGNOSIS — I2510 Atherosclerotic heart disease of native coronary artery without angina pectoris: Secondary | ICD-10-CM | POA: Diagnosis not present

## 2017-12-02 DIAGNOSIS — I2510 Atherosclerotic heart disease of native coronary artery without angina pectoris: Secondary | ICD-10-CM | POA: Diagnosis not present

## 2017-12-03 DIAGNOSIS — I2510 Atherosclerotic heart disease of native coronary artery without angina pectoris: Secondary | ICD-10-CM | POA: Diagnosis not present

## 2017-12-04 DIAGNOSIS — I2510 Atherosclerotic heart disease of native coronary artery without angina pectoris: Secondary | ICD-10-CM | POA: Diagnosis not present

## 2017-12-04 NOTE — Progress Notes (Signed)
Patient's Name: Marie Clarke  MRN: [id number]  Referring Provider: Milinda Pointer, MD  DOB: 1970-08-27  PCP: Sharyne Peach, MD  DOS: 12/05/2017  Note by: Gaspar Cola, MD  Service setting: Ambulatory outpatient  Specialty: Interventional Pain Management  Patient type: Established  Location: [facility] (AMB) Pain Management Facility  Visit type: Interventional Procedure   Primary Reason for Visit: Interventional Pain Management Treatment. CC: Back Pain (low)  Procedure:          Anesthesia, Analgesia, Anxiolysis:  Type: Lumbar Facet, Medial Branch Block(s) #2  Primary Purpose: Diagnostic Region: Posterolateral Lumbosacral Spine Level: L2, L3, L4, L5, & S1 Medial Branch Level(s). Injecting these levels blocks the L3-4, L4-5, and L5-S1 lumbar facet joints. Laterality: Bilateral  Type: Moderate (Conscious) Sedation combined with Local Anesthesia Indication(s): Analgesia and Anxiety Route: Intravenous (IV) IV Access: Secured Sedation: Meaningful verbal contact was maintained at all times during the procedure  Local Anesthetic: Lidocaine 1-2%  Position: Prone   Indications: 1. Spondylosis without myelopathy or radiculopathy, lumbosacral region   2. Lumbar facet syndrome (Bilateral) (L>R)   3. Lumbar facet hypertrophy   4. Chronic low back pain (Primary Area of Pain) (Bilateral) (L>R)    Pain Score: Pre-procedure: 8 /10 Post-procedure: 0-No pain/10  Pre-op Assessment:  Marie Clarke is a 47 y.o. (year old), female patient, seen today for interventional treatment. She  has a past surgical history that includes prolapse rectum surgery (N/A, July 2016); Abdominal hysterectomy; Tonsillectomy; and Hernia repair (07/15/2017). Marie Clarke has a current medication list which includes the following prescription(s): acetaminophen, aspirin ec, atorvastatin, azelastine, bumetanide, buspirone, gabapentin, hydroxyzine, klor-con m20, lamotrigine, levocetirizine, levothyroxine, magnesium oxide, metformin,  methocarbamol, montelukast, omega-3 fish oil, promethazine, quetiapine, tramadol, trelegy ellipta, and levothyroxine, and the following Facility-Administered Medications: fentanyl and midazolam. Her primarily concern today is the Back Pain (low)  Initial Vital Signs:  Pulse/HCG Rate: (!) 101ECG Heart Rate: (!) 105 Temp: 98 F (36.7 C) Resp: 18 BP: (!) 142/87 SpO2: 97 %  BMI: Estimated body mass index is 36.05 kg/m as calculated from the following:   Height as of this encounter: 5\' 4"  (1.626 m).   Weight as of this encounter: 210 lb (95.3 kg).  Risk Assessment: Allergies: Reviewed. She is allergic to diazepam; levofloxacin; metronidazole; pregabalin; sulfa antibiotics; cephalexin; ciprofloxacin; ziprasidone hcl; and penicillins.  Allergy Precautions: None required Coagulopathies: Reviewed. None identified.  Blood-thinner therapy: None at this time Active Infection(s): Reviewed. None identified. Marie Clarke is afebrile  Site Confirmation: Marie Clarke was asked to confirm the procedure and laterality before marking the site Procedure checklist: Completed Consent: Before the procedure and under the influence of no sedative(s), amnesic(s), or anxiolytics, the patient was informed of the treatment options, risks and possible complications. To fulfill our ethical and legal obligations, as recommended by the American Medical Association's Code of Ethics, I have informed the patient of my clinical impression; the nature and purpose of the treatment or procedure; the risks, benefits, and possible complications of the intervention; the alternatives, including doing nothing; the risk(s) and benefit(s) of the alternative treatment(s) or procedure(s); and the risk(s) and benefit(s) of doing nothing. The patient was provided information about the general risks and possible complications associated with the procedure. These may include, but are not limited to: failure to achieve desired goals, infection,  bleeding, organ or nerve damage, allergic reactions, paralysis, and death. In addition, the patient was informed of those risks and complications associated to Spine-related procedures, such as failure to decrease pain; infection (i.e.: Meningitis,  epidural or intraspinal abscess); bleeding (i.e.: epidural hematoma, subarachnoid hemorrhage, or any other type of intraspinal or peri-dural bleeding); organ or nerve damage (i.e.: Any type of peripheral nerve, nerve root, or spinal cord injury) with subsequent damage to sensory, motor, and/or autonomic systems, resulting in permanent pain, numbness, and/or weakness of one or several areas of the body; allergic reactions; (i.e.: anaphylactic reaction); and/or death. Furthermore, the patient was informed of those risks and complications associated with the medications. These include, but are not limited to: allergic reactions (i.e.: anaphylactic or anaphylactoid reaction(s)); adrenal axis suppression; blood sugar elevation that in diabetics may result in ketoacidosis or comma; water retention that in patients with history of congestive heart failure may result in shortness of breath, pulmonary edema, and decompensation with resultant heart failure; weight gain; swelling or edema; medication-induced neural toxicity; particulate matter embolism and blood vessel occlusion with resultant organ, and/or nervous system infarction; and/or aseptic necrosis of one or more joints. Finally, the patient was informed that Medicine is not an exact science; therefore, there is also the possibility of unforeseen or unpredictable risks and/or possible complications that may result in a catastrophic outcome. The patient indicated having understood very clearly. We have given the patient no guarantees and we have made no promises. Enough time was given to the patient to ask questions, all of which were answered to the patient's satisfaction. Marie Clarke has indicated that she wanted to  continue with the procedure. Attestation: I, the ordering provider, attest that I have discussed with the patient the benefits, risks, side-effects, alternatives, likelihood of achieving goals, and potential problems during recovery for the procedure that I have provided informed consent. Date  Time: 12/05/2017  7:47 AM  Pre-Procedure Preparation:  Monitoring: As per clinic protocol. Respiration, ETCO2, SpO2, BP, heart rate and rhythm monitor placed and checked for adequate function Safety Precautions: Patient was assessed for positional comfort and pressure points before starting the procedure. Time-out: I initiated and conducted the "Time-out" before starting the procedure, as per protocol. The patient was asked to participate by confirming the accuracy of the "Time Out" information. Verification of the correct person, site, and procedure were performed and confirmed by me, the nursing staff, and the patient. "Time-out" conducted as per Joint Commission's Universal Protocol (UP.01.01.01). Time: 9983  Description of Procedure:          Laterality: Bilateral. The procedure was performed in identical fashion on both sides. Levels:  L2, L3, L4, L5, & S1 Medial Branch Level(s) Area Prepped: Posterior Lumbosacral Region Prepping solution: ChloraPrep (2% chlorhexidine gluconate and 70% isopropyl alcohol) Safety Precautions: Aspiration looking for blood return was conducted prior to all injections. At no point did we inject any substances, as a needle was being advanced. Before injecting, the patient was told to immediately notify me if she was experiencing any new onset of "ringing in the ears, or metallic taste in the mouth". No attempts were made at seeking any paresthesias. Safe injection practices and needle disposal techniques used. Medications properly checked for expiration dates. SDV (single dose vial) medications used. After the completion of the procedure, all disposable equipment used was  discarded in the proper designated medical waste containers. Local Anesthesia: Protocol guidelines were followed. The patient was positioned over the fluoroscopy table. The area was prepped in the usual manner. The time-out was completed. The target area was identified using fluoroscopy. A 12-in long, straight, sterile hemostat was used with fluoroscopic guidance to locate the targets for each level blocked. Once located, the  skin was marked with an approved surgical skin marker. Once all sites were marked, the skin (epidermis, dermis, and hypodermis), as well as deeper tissues (fat, connective tissue and muscle) were infiltrated with a small amount of a short-acting local anesthetic, loaded on a 10cc syringe with a 25G, 1.5-in  Needle. An appropriate amount of time was allowed for local anesthetics to take effect before proceeding to the next step. Local Anesthetic: Lidocaine 2.0% The unused portion of the local anesthetic was discarded in the proper designated containers. Technical explanation of process:  L2 Medial Branch Nerve Block (MBB): The target area for the L2 medial branch is at the junction of the postero-lateral aspect of the superior articular process and the superior, posterior, and medial edge of the transverse process of L3. Under fluoroscopic guidance, a Quincke needle was inserted until contact was made with os over the superior postero-lateral aspect of the pedicular shadow (target area). After negative aspiration for blood, 0.5 mL of the nerve block solution was injected without difficulty or complication. The needle was removed intact. L3 Medial Branch Nerve Block (MBB): The target area for the L3 medial branch is at the junction of the postero-lateral aspect of the superior articular process and the superior, posterior, and medial edge of the transverse process of L4. Under fluoroscopic guidance, a Quincke needle was inserted until contact was made with os over the superior  postero-lateral aspect of the pedicular shadow (target area). After negative aspiration for blood, 0.5 mL of the nerve block solution was injected without difficulty or complication. The needle was removed intact. L4 Medial Branch Nerve Block (MBB): The target area for the L4 medial branch is at the junction of the postero-lateral aspect of the superior articular process and the superior, posterior, and medial edge of the transverse process of L5. Under fluoroscopic guidance, a Quincke needle was inserted until contact was made with os over the superior postero-lateral aspect of the pedicular shadow (target area). After negative aspiration for blood, 0.5 mL of the nerve block solution was injected without difficulty or complication. The needle was removed intact. L5 Medial Branch Nerve Block (MBB): The target area for the L5 medial branch is at the junction of the postero-lateral aspect of the superior articular process and the superior, posterior, and medial edge of the sacral ala. Under fluoroscopic guidance, a Quincke needle was inserted until contact was made with os over the superior postero-lateral aspect of the pedicular shadow (target area). After negative aspiration for blood, 0.5 mL of the nerve block solution was injected without difficulty or complication. The needle was removed intact. S1 Medial Branch Nerve Block (MBB): The target area for the S1 medial branch is at the posterior and inferior 6 o'clock position of the L5-S1 facet joint. Under fluoroscopic guidance, the Quincke needle inserted for the L5 MBB was redirected until contact was made with os over the inferior and postero aspect of the sacrum, at the 6 o' clock position under the L5-S1 facet joint (Target area). After negative aspiration for blood, 0.5 mL of the nerve block solution was injected without difficulty or complication. The needle was removed intact. Procedural Needles: 22-gauge, 3.5-inch, Quincke needles used for all  levels. Nerve block solution: 0.2% PF-Ropivacaine + Triamcinolone (40 mg/mL) diluted to a final concentration of 4 mg of Triamcinolone/mL of Ropivacaine The unused portion of the solution was discarded in the proper designated containers.  Once the entire procedure was completed, the treated area was cleaned, making sure to leave some of  the prepping solution back to take advantage of its long term bactericidal properties.   Illustration of the posterior view of the lumbar spine and the posterior neural structures. Laminae of L2 through S1 are labeled. DPRL5, dorsal primary ramus of L5; DPRS1, dorsal primary ramus of S1; DPR3, dorsal primary ramus of L3; FJ, facet (zygapophyseal) joint L3-L4; I, inferior articular process of L4; LB1, lateral branch of dorsal primary ramus of L1; IAB, inferior articular branches from L3 medial branch (supplies L4-L5 facet joint); IBP, intermediate branch plexus; MB3, medial branch of dorsal primary ramus of L3; NR3, third lumbar nerve root; S, superior articular process of L5; SAB, superior articular branches from L4 (supplies L4-5 facet joint also); TP3, transverse process of L3.  Vitals:   12/05/17 0900 12/05/17 0905 12/05/17 0915 12/05/17 0925  BP: 123/88 106/65 (!) 108/96 (!) 120/91  Pulse: 94     Resp: 16 17 (!) 28 17  Temp:  98.3 F (36.8 C)    TempSrc:  Temporal    SpO2: 96% 96% 97% 99%  Weight:      Height:         Start Time: 0835 hrs. End Time: 0858 hrs.  Imaging Guidance (Spinal):          Type of Imaging Technique: Fluoroscopy Guidance (Spinal) Indication(s): Assistance in needle guidance and placement for procedures requiring needle placement in or near specific anatomical locations not easily accessible without such assistance. Exposure Time: Please see nurses notes. Contrast: None used. Fluoroscopic Guidance: I was personally present during the use of fluoroscopy. "Tunnel Vision Technique" used to obtain the best possible view of the target  area. Parallax error corrected before commencing the procedure. "Direction-depth-direction" technique used to introduce the needle under continuous pulsed fluoroscopy. Once target was reached, antero-posterior, oblique, and lateral fluoroscopic projection used confirm needle placement in all planes. Images permanently stored in EMR. Interpretation: No contrast injected. I personally interpreted the imaging intraoperatively. Adequate needle placement confirmed in multiple planes. Permanent images saved into the patient's record.  Antibiotic Prophylaxis:   Anti-infectives (From admission, onward)   None     Indication(s): None identified  Post-operative Assessment:  Post-procedure Vital Signs:  Pulse/HCG Rate: 94(!) 107 Temp: 98.3 F (36.8 C) Resp: 17 BP: (!) 120/91 SpO2: 99 %  EBL: None  Complications: No immediate post-treatment complications observed by team, or reported by patient.  Note: The patient tolerated the entire procedure well. A repeat set of vitals were taken after the procedure and the patient was kept under observation following institutional policy, for this type of procedure. Post-procedural neurological assessment was performed, showing return to baseline, prior to discharge. The patient was provided with post-procedure discharge instructions, including a section on how to identify potential problems. Should any problems arise concerning this procedure, the patient was given instructions to immediately contact us, at any time, without hesitation. In any case, we plan to contact the patient by telephone for a follow-up status report regarding this interventional procedure.  Comments:  No additional relevant information.  Plan of Care    Imaging Orders     DG C-Arm 1-60 Min-No Report  Procedure Orders     LUMBAR FACET(MEDIAL BRANCH NERVE BLOCK) MBNB  Medications ordered for procedure: Meds ordered this encounter  Medications  . lidocaine (XYLOCAINE) 2 % (with  pres) injection 400 mg  . midazolam (VERSED) 5 MG/5ML injection 1-2 mg    Make sure Flumazenil is available in the pyxis when using this medication. If oversedation occurs, administer  0.2 mg IV over 15 sec. If after 45 sec no response, administer 0.2 mg again over 1 min; may repeat at 1 min intervals; not to exceed 4 doses (1 mg)  . fentaNYL (SUBLIMAZE) injection 25-50 mcg    Make sure Narcan is available in the pyxis when using this medication. In the event of respiratory depression (RR< 8/min): Titrate NARCAN (naloxone) in increments of 0.1 to 0.2 mg IV at 2-3 minute intervals, until desired degree of reversal.  . lactated ringers infusion 1,000 mL  . ropivacaine (PF) 2 mg/mL (0.2%) (NAROPIN) injection 18 mL  . triamcinolone acetonide (KENALOG-40) injection 80 mg   Medications administered: We administered lidocaine, midazolam, fentaNYL, lactated ringers, ropivacaine (PF) 2 mg/mL (0.2%), and triamcinolone acetonide.  See the medical record for exact dosing, route, and time of administration.  Disposition: Discharge home  Discharge Date & Time: 12/05/2017; 0930 hrs.   Physician-requested Follow-up: Return for post-procedure eval (2 wks), w/ Dr. Dossie Arbour.  Future Appointments  Date Time Provider [place]  12/23/2017  9:15 AM Milinda Pointer, MD [facility] None  01/01/2018  8:30 AM Ursula Alert, MD [facility] None  01/22/2018  8:45 AM Vevelyn Francois, NP [facility] None   Primary Care Physician: Sharyne Peach, MD Location: [facility] Note by: Gaspar Cola, MD Date: 12/05/2017; Time: 9:30 AM  Disclaimer:  Medicine is not an Chief Strategy Officer. The only guarantee in medicine is that nothing is guaranteed. It is important to note that the decision to proceed with this intervention was based on the information collected from the patient. The Data and conclusions were drawn from the patient's questionnaire, the interview, and the physical  examination. Because the information was provided in large part by the patient, it cannot be guaranteed that it has not been purposely or unconsciously manipulated. Every effort has been made to obtain as much relevant data as possible for this evaluation. It is important to note that the conclusions that lead to this procedure are derived in large part from the available data. Always take into account that the treatment will also be dependent on availability of resources and existing treatment guidelines, considered by other Pain Management Practitioners as being common knowledge and practice, at the time of the intervention. For Medico-Legal purposes, it is also important to point out that variation in procedural techniques and pharmacological choices are the acceptable norm. The indications, contraindications, technique, and results of the above procedure should only be interpreted and judged by a Board-Certified Interventional Pain Specialist with extensive familiarity and expertise in the same exact procedure and technique.

## 2017-12-04 NOTE — Patient Instructions (Signed)

## 2017-12-05 ENCOUNTER — Other Ambulatory Visit: Payer: Self-pay

## 2017-12-05 ENCOUNTER — Ambulatory Visit (HOSPITAL_BASED_OUTPATIENT_CLINIC_OR_DEPARTMENT_OTHER): Payer: Medicare HMO | Admitting: Pain Medicine

## 2017-12-05 ENCOUNTER — Ambulatory Visit
Admission: RE | Admit: 2017-12-05 | Discharge: 2017-12-05 | Disposition: A | Discharge status: Home / Self Care | Payer: Medicare HMO | Source: Ambulatory Visit | Attending: Pain Medicine | Admitting: Pain Medicine

## 2017-12-05 ENCOUNTER — Encounter: Payer: Self-pay | Admitting: Pain Medicine

## 2017-12-05 VITALS — BP 120/91 | HR 94 | Temp 98.3°F | Resp 17 | Ht 64.0 in | Wt 210.0 lb

## 2017-12-05 DIAGNOSIS — M47817 Spondylosis without myelopathy or radiculopathy, lumbosacral region: Secondary | ICD-10-CM | POA: Insufficient documentation

## 2017-12-05 DIAGNOSIS — Z880 Allergy status to penicillin: Secondary | ICD-10-CM | POA: Insufficient documentation

## 2017-12-05 DIAGNOSIS — M545 Low back pain, unspecified: Secondary | ICD-10-CM

## 2017-12-05 DIAGNOSIS — M47816 Spondylosis without myelopathy or radiculopathy, lumbar region: Secondary | ICD-10-CM

## 2017-12-05 DIAGNOSIS — I2510 Atherosclerotic heart disease of native coronary artery without angina pectoris: Secondary | ICD-10-CM | POA: Diagnosis not present

## 2017-12-05 DIAGNOSIS — Z881 Allergy status to other antibiotic agents status: Secondary | ICD-10-CM | POA: Diagnosis not present

## 2017-12-05 DIAGNOSIS — G8929 Other chronic pain: Secondary | ICD-10-CM

## 2017-12-05 DIAGNOSIS — Z882 Allergy status to sulfonamides status: Secondary | ICD-10-CM | POA: Insufficient documentation

## 2017-12-05 MED ORDER — LIDOCAINE HCL 2 % IJ SOLN
INTRAMUSCULAR | Status: AC
  Filled 2017-12-05: qty 20

## 2017-12-05 MED ORDER — LACTATED RINGERS IV SOLN
1000.0000 mL | Freq: Once | INTRAVENOUS | Status: AC
  Administered 2017-12-05: 1000 mL via INTRAVENOUS

## 2017-12-05 MED ORDER — MIDAZOLAM HCL 5 MG/5ML IJ SOLN
INTRAMUSCULAR | Status: AC
  Filled 2017-12-05: qty 5

## 2017-12-05 MED ORDER — FENTANYL CITRATE (PF) 100 MCG/2ML IJ SOLN
25.0000 ug | INTRAMUSCULAR | Status: DC | PRN
  Administered 2017-12-05: 100 ug via INTRAVENOUS

## 2017-12-05 MED ORDER — ROPIVACAINE HCL 2 MG/ML IJ SOLN
INTRAMUSCULAR | Status: AC
  Filled 2017-12-05: qty 20

## 2017-12-05 MED ORDER — LIDOCAINE HCL 2 % IJ SOLN
20.0000 mL | Freq: Once | INTRAMUSCULAR | Status: AC
  Administered 2017-12-05: 400 mg

## 2017-12-05 MED ORDER — TRIAMCINOLONE ACETONIDE 40 MG/ML IJ SUSP
INTRAMUSCULAR | Status: AC
  Filled 2017-12-05: qty 2

## 2017-12-05 MED ORDER — FENTANYL CITRATE (PF) 100 MCG/2ML IJ SOLN
INTRAMUSCULAR | Status: AC
  Filled 2017-12-05: qty 2

## 2017-12-05 MED ORDER — MIDAZOLAM HCL 5 MG/5ML IJ SOLN
1.0000 mg | INTRAMUSCULAR | Status: DC | PRN
  Administered 2017-12-05: 3 mg via INTRAVENOUS

## 2017-12-05 MED ORDER — ROPIVACAINE HCL 2 MG/ML IJ SOLN
18.0000 mL | Freq: Once | INTRAMUSCULAR | Status: AC
  Administered 2017-12-05: 10 mL via PERINEURAL

## 2017-12-05 MED ORDER — TRIAMCINOLONE ACETONIDE 40 MG/ML IJ SUSP
80.0000 mg | Freq: Once | INTRAMUSCULAR | Status: AC
  Administered 2017-12-05: 80 mg

## 2017-12-05 NOTE — Progress Notes (Signed)
Safety precautions to be maintained throughout the outpatient stay will include: orient to surroundings, keep bed in low position, maintain call bell within reach at all times, provide assistance with transfer out of bed and ambulation.  

## 2017-12-06 ENCOUNTER — Telehealth: Payer: Self-pay

## 2017-12-06 DIAGNOSIS — I2510 Atherosclerotic heart disease of native coronary artery without angina pectoris: Secondary | ICD-10-CM | POA: Diagnosis not present

## 2017-12-06 NOTE — Telephone Encounter (Signed)
Post procedure phone call.  Patient states her pain is coming back.  Informed her that it was OK to take her pain medication as prescribed and that it could take 4-10 days for the steroid to start working.  Instructed patient to put heat on her back today and to call us for any further questions or concerns,.

## 2017-12-07 DIAGNOSIS — I2510 Atherosclerotic heart disease of native coronary artery without angina pectoris: Secondary | ICD-10-CM | POA: Diagnosis not present

## 2017-12-08 DIAGNOSIS — I2510 Atherosclerotic heart disease of native coronary artery without angina pectoris: Secondary | ICD-10-CM | POA: Diagnosis not present

## 2017-12-09 ENCOUNTER — Telehealth: Payer: Self-pay | Admitting: Pain Medicine

## 2017-12-09 DIAGNOSIS — I2510 Atherosclerotic heart disease of native coronary artery without angina pectoris: Secondary | ICD-10-CM | POA: Diagnosis not present

## 2017-12-09 NOTE — Telephone Encounter (Signed)
Patient advised that it is expected to have increased pain for few days following facet block. Advised to apply heat to the lower back.

## 2017-12-09 NOTE — Telephone Encounter (Signed)
Patient had procedure last week, started hurting yesterday mid lower back down into legs and cramping. Please call patient to discuss what to do

## 2017-12-10 DIAGNOSIS — I2510 Atherosclerotic heart disease of native coronary artery without angina pectoris: Secondary | ICD-10-CM | POA: Diagnosis not present

## 2017-12-11 DIAGNOSIS — I2510 Atherosclerotic heart disease of native coronary artery without angina pectoris: Secondary | ICD-10-CM | POA: Diagnosis not present

## 2017-12-12 DIAGNOSIS — I2510 Atherosclerotic heart disease of native coronary artery without angina pectoris: Secondary | ICD-10-CM | POA: Diagnosis not present

## 2017-12-13 DIAGNOSIS — G4733 Obstructive sleep apnea (adult) (pediatric): Secondary | ICD-10-CM | POA: Diagnosis not present

## 2017-12-13 DIAGNOSIS — I2510 Atherosclerotic heart disease of native coronary artery without angina pectoris: Secondary | ICD-10-CM | POA: Diagnosis not present

## 2017-12-14 DIAGNOSIS — I2510 Atherosclerotic heart disease of native coronary artery without angina pectoris: Secondary | ICD-10-CM | POA: Diagnosis not present

## 2017-12-15 DIAGNOSIS — I2510 Atherosclerotic heart disease of native coronary artery without angina pectoris: Secondary | ICD-10-CM | POA: Diagnosis not present

## 2017-12-16 DIAGNOSIS — I2510 Atherosclerotic heart disease of native coronary artery without angina pectoris: Secondary | ICD-10-CM | POA: Diagnosis not present

## 2017-12-17 ENCOUNTER — Encounter: Payer: Self-pay | Admitting: Pain Medicine

## 2017-12-17 DIAGNOSIS — I2510 Atherosclerotic heart disease of native coronary artery without angina pectoris: Secondary | ICD-10-CM | POA: Diagnosis not present

## 2017-12-18 ENCOUNTER — Telehealth: Payer: Self-pay | Admitting: *Deleted

## 2017-12-18 DIAGNOSIS — I2510 Atherosclerotic heart disease of native coronary artery without angina pectoris: Secondary | ICD-10-CM | POA: Diagnosis not present

## 2017-12-18 NOTE — Telephone Encounter (Signed)
Called patient about earlier e-mail informing us of leg cramps at night. After discussing with patient and Dr. Dossie Arbour I instructed the patient to drink at least 3 Gatorades per day and see if replenishment of electrolytes would help with the cramping. Call if no better in a few days and confirmed her appointment for F/U Monday 12-16-2017.

## 2017-12-19 DIAGNOSIS — I2510 Atherosclerotic heart disease of native coronary artery without angina pectoris: Secondary | ICD-10-CM | POA: Diagnosis not present

## 2017-12-20 DIAGNOSIS — I2510 Atherosclerotic heart disease of native coronary artery without angina pectoris: Secondary | ICD-10-CM | POA: Diagnosis not present

## 2017-12-21 DIAGNOSIS — I2510 Atherosclerotic heart disease of native coronary artery without angina pectoris: Secondary | ICD-10-CM | POA: Diagnosis not present

## 2017-12-22 DIAGNOSIS — I2510 Atherosclerotic heart disease of native coronary artery without angina pectoris: Secondary | ICD-10-CM | POA: Diagnosis not present

## 2017-12-22 NOTE — Progress Notes (Signed)
Patient's Name: Marie Clarke  MRN: [id number]  Referring Provider: Sharyne Peach, MD  DOB: June 12, 1970  PCP: Sharyne Peach, MD  DOS: 12/23/2017  Note by: Gaspar Cola, MD  Service setting: Ambulatory outpatient  Specialty: Interventional Pain Management  Location: [facility] (AMB) Pain Management Facility    Patient type: Established   Primary Reason(s) for Visit: Encounter for post-procedure evaluation of chronic illness with mild to moderate exacerbation CC: Back Pain (left is worse) and Leg Pain (left, posterior to toes)  HPI  [name] is a 47 y.o. year old, female patient, who comes today for a post-procedure evaluation. She has Affective bipolar disorder ([place]); Arteriosclerosis of coronary artery; CCF (congestive cardiac failure) ([place]); Chronic kidney disease, stage III (moderate) ([place]); Chronic kidney disease; Controlled diabetes mellitus type II without complication ([place]); Essential (primary) hypertension; Polypharmacy; Adult hypothyroidism; Detrusor muscle hypertonia; Algodystrophic syndrome; Apnea, sleep; Temporary cerebral vascular dysfunction; Rectal prolapse; Rectal bleeding; Rectal bleed; Detrusor dyssynergia; Diabetes mellitus, type 2 ([place]); Abdominal wall abscess; Bipolar affective disorder ([place]); Congestive heart failure ([place]); Controlled type 2 diabetes mellitus without complication ([place]); Other long term (current) drug therapy; Type 2 diabetes mellitus ([place]); Abnormal gait; Neurosis, posttraumatic; Chronic pain; Chronic low back pain (Primary Area of Pain) (Bilateral) (L>R); Chronic lower extremity pain (Referred) (Secondary Area of Pain) (Left); Abdominal wound dehiscence; Encounter for pain management planning; Morbid obesity ([place]); Abnormal CT scan, lumbar spine; Lumbar facet hypertrophy; Lumbar facet syndrome (Bilateral) (L>R); Lumbar foraminal stenosis (Bilateral) (L5-S1); Chronic ankle pain [facility] Area of Pain) (Left); Neurogenic pain; Neuropathic pain; Myofascial pain;  History of suicide attempt; Incidental lung nodule; Contusion of knee (Left); Strain of knee (Left); Hypotension; Sepsis ([place]); Osteopenia; Strain of extensor muscle, fascia and tendon of left index finger at wrist and hand level, initial encounter; Tobacco use disorder; Ventral hernia without obstruction or gangrene; Chronic neck pain (Fourth Area of Pain) (Bilateral) (L>R); Pharmacologic therapy; Disorder of skeletal system; Problems influencing health status; Long term current use of opiate analgesic; Elevated C-reactive protein (CRP); Elevated sed rate; Chronic pain syndrome; Spondylosis without myelopathy or radiculopathy, lumbosacral region; Chronic musculoskeletal pain; Chronic shoulder pain (Fifth Area of Pain) (Bilateral) (L>R); CRPS (complex regional pain syndrome) type 1 of lower limb (left ankle); Ankle joint instability (Left); Ankle sprain, sequela (Left); History of psychiatric symptoms; History of postoperative nausea; Disease related peripheral neuropathy; Gastroesophageal reflux disease without esophagitis; Occipital headache (Bilateral); and Cervicogenic headache (Bilateral) (L>R) on their problem list. Her primarily concern today is the Back Pain (left is worse) and Leg Pain (left, posterior to toes)  Pain Assessment: Location: Lower Back Radiating: left leg Onset: More than a month ago Duration: Chronic pain Quality: Aching, Constant, Stabbing, Dull Severity: 4 /10 (subjective, self-reported pain score)  Note: Reported level is inconsistent with clinical observations. Clinically the patient looks like a 2/10 A 2/10 is viewed as "Mild to Moderate" and described as noticeable and distracting. Impossible to hide from other people. More frequent flare-ups. Still possible to adapt and function close to normal. It can be very annoying and may have occasional stronger flare-ups. With discipline, patients may get used to it and adapt. [name] continues to use a standard subjective pain scale,  rather than an objective pain scale as instructed. When using our objective Pain Scale, levels between 6 and 10/10 are said to belong in an emergency room, as it progressively worsens from a 6/10, described as severely limiting, requiring emergency care not usually available at an outpatient pain management facility. At a  6/10 level, communication becomes difficult and requires great effort. Assistance to reach the emergency department may be required. Facial flushing and profuse sweating along with potentially dangerous increases in heart rate and blood pressure will be evident. Timing: Constant Modifying factors: procedures, Tramadol, Methocarbamol, heat  BP: (!) 145/93  HR: (!) 107  [name] comes in today for post-procedure evaluation.  She returns today indicating that she again attained 100% relief of the pain for the duration of the local anesthetic confirming that the pain is in fact coming from the lumbar facets.  Unfortunately, she has not obtained long-term benefit and therefore we plan on proceeding with the radiofrequency ablation.  She has already tried physical therapy, which she failed last year.  Further details on both, my assessment(s), as well as the proposed treatment plan, please see below.  Post-Procedure Assessment  12/09/2017 Procedure: Diagnostic bilateral lumbar facet block#2under fluoroscopic guidance and IV sedation  Pre-procedure pain score:  8/10 Post-procedure pain score: 0/10 (100% relief) Influential Factors: BMI: 36.05 kg/m Intra-procedural challenges: None observed.         Assessment challenges: None detected.              Reported side-effects: None.        Post-procedural adverse reactions or complications: None reported         Sedation: Sedation provided. When no sedatives are used, the analgesic levels obtained are directly associated to the effectiveness of the local anesthetics. However, when sedation is provided, the level of analgesia obtained  during the initial 1 hour following the intervention, is believed to be the result of a combination of factors. These factors may include, but are not limited to: 1. The effectiveness of the local anesthetics used. 2. The effects of the analgesic(s) and/or anxiolytic(s) used. 3. The degree of discomfort experienced by the patient at the time of the procedure. 4. The patients ability and reliability in recalling and recording the events. 5. The presence and influence of possible secondary gains and/or psychosocial factors. Reported result: Relief experienced during the 1st hour after the procedure: 100 % (Ultra-Short Term Relief) Ms. Killilea has indicated area to have been numb during this time. Interpretative annotation: Clinically appropriate result. Analgesia during this period is likely to be Local Anesthetic and/or IV Sedative (Analgesic/Anxiolytic) related.          Effects of local anesthetic: The analgesic effects attained during this period are directly associated to the localized infiltration of local anesthetics and therefore cary significant diagnostic value as to the etiological location, or anatomical origin, of the pain. Expected duration of relief is directly dependent on the pharmacodynamics of the local anesthetic used. Long-acting (4-6 hours) anesthetics used.  Reported result: Relief during the next 4 to 6 hour after the procedure: 100 % (Short-Term Relief) Ms. Fort has indicated area to have been numb during this time. Interpretative annotation: Clinically appropriate result. Analgesia during this period is likely to be Local Anesthetic-related.          Long-term benefit: Defined as the period of time past the expected duration of local anesthetics (1 hour for short-acting and 4-6 hours for long-acting). With the possible exception of prolonged sympathetic blockade from the local anesthetics, benefits during this period are typically attributed to, or associated with, other factors  such as analgesic sensory neuropraxia, antiinflammatory effects, or beneficial biochemical changes provided by agents other than the local anesthetics.  Reported result: Extended relief following procedure: 40 % (Long-Term Relief)  Interpretative annotation: Clinically possible results. Good relief. No permanent benefit expected. Inflammation plays a part in the etiology to the pain.          Current benefits: Defined as reported results that persistent at this point in time.   Analgesia: <50 %            Function: Somewhat improved ROM: Somewhat improved Interpretative annotation: Recurrence of symptoms. No permanent benefit expected. Effective diagnostic intervention.          Interpretation: Results would suggest a successful diagnostic intervention.                  Plan:  Proceed with Radiofrequency Ablation for the purpose of attaining long-term benefits.       "The patient has failed to respond to conservative therapies including over-the-counter medications, anti-inflammatories, muscle relaxants, membrane stabilizers, opioids, physical therapy modalities such as heat and ice, as well as more invasive techniques such as nerve blocks. Because Ms. Choplin did attain more than 50% relief of the pain during a series of diagnostic blocks conducted in separate occasions, I believe it is medically necessary to proceed with Radiofrequency Ablation, in order to attempt gaining longer relief.   Medical Necessity: Ms. Virella has been dealing with the above chronic pain from the Spondylosis without myelopathy or radiculopathy, lumbosacral region [M47.817] for longer than three months and has either failed to respond, was unable to tolerate, or simply did not get enough benefit from other more conservative therapies including, but not limited to: 1. Over-the-counter medications 2. Anti-inflammatory medications 3. Muscle relaxants 4. Membrane stabilizers 5. Opioids 6. Physical therapy (tried and  failed about 1 year ago) 7. Modalities (Heat, ice, etc.) 8. Invasive techniques such as nerve blocks. Ms. Marschall has attained more than 50% relief of the pain from a series of diagnostic injections conducted in separate occasions. For this reason, I believe it is medically necessary to proceed withRadiofrequency Ablation for the purpose of attempting to prolong the duration of the benefits seen with the diagnostic injections.  Laboratory Chemistry  Inflammation Markers (CRP: Acute Phase) (ESR: Chronic Phase) Lab Results  Component Value Date   CRP 21 (H) 09/09/2017   ESRSEDRATE 50 (H) 09/09/2017   LATICACIDVEN 1.9 04/15/2017                         Rheumatology Markers Lab Results  Component Value Date   RF <10.0 09/09/2017   ANA Negative 09/09/2017                        Renal Markers Lab Results  Component Value Date   BUN 12 09/27/2017   CREATININE 0.66 09/27/2017   BCR 16 09/09/2017   GFRAA >60 09/27/2017   GFRNONAA >60 09/27/2017                             Hepatic Markers Lab Results  Component Value Date   AST 16 09/09/2017   ALT 14 08/10/2017   ALBUMIN 4.6 09/09/2017                        Neuropathy Markers Lab Results  Component Value Date   TMLYYTKP54 656 09/09/2017   HIV Non Reactive 09/18/2016                        Hematology  Parameters Lab Results  Component Value Date   INR 0.92 09/20/2016   LABPROT 12.3 09/20/2016   APTT <24 (L) 09/20/2016   PLT 259 09/27/2017   HGB 12.3 09/27/2017   HCT 36.8 09/27/2017                        CV Markers Lab Results  Component Value Date   BNP 24.0 07/04/2016   TROPONINI <0.03 09/27/2017                         Note: Lab results reviewed.  Recent Imaging Results   Results for orders placed in visit on 12/05/17  DG C-Arm 1-60 Min-No Report   Narrative Fluoroscopy was utilized by the requesting physician.  No radiographic  interpretation.    Interpretation Report: Fluoroscopy was used during the  procedure to assist with needle guidance. The images were interpreted intraoperatively by the requesting physician.  Meds   Current Outpatient Medications:  .  acetaminophen (TYLENOL) 325 MG tablet, Take 1 tablet (325 mg total) by mouth every 6 (six) hours as needed for mild pain (or Fever >/= 101)., Disp: , Rfl:  .  aspirin EC 81 MG tablet, Take 81 mg by mouth daily at 12 noon., Disp: , Rfl:  .  atorvastatin (LIPITOR) 40 MG tablet, Take 40 mg by mouth at bedtime. , Disp: , Rfl:  .  azelastine (ASTELIN) 0.1 % nasal spray, Place 1 spray into both nostrils daily., Disp: , Rfl:  .  busPIRone (BUSPAR) 10 MG tablet, Take 2 tablets (20 mg total) by mouth 3 (three) times daily. (Patient taking differently: Take 15 mg by mouth 3 (three) times daily. ), Disp: 540 tablet, Rfl: 1 .  gabapentin (NEURONTIN) 600 MG tablet, Take 2 tablets (1,200 mg total) by mouth 3 (three) times daily., Disp: 180 tablet, Rfl: 5 .  hydrOXYzine (ATARAX/VISTARIL) 10 MG tablet, Take 1-3 tablets (10-30 mg total) by mouth 2 (two) times daily as needed (for severe anxiety sx)., Disp: 300 tablet, Rfl: 0 .  KLOR-CON M20 20 MEQ tablet, Take 20 mEq by mouth daily. , Disp: , Rfl:  .  lamoTRIgine (LAMICTAL) 25 MG tablet, Take 1-2 tablets (25-50 mg total) by mouth daily. Take 25 mg for 2 weeks and increase to 50 mg, Disp: 180 tablet, Rfl: 0 .  levocetirizine (XYZAL) 5 MG tablet, Take 5 mg by mouth every evening. , Disp: , Rfl:  .  levothyroxine (SYNTHROID, LEVOTHROID) 200 MCG tablet, Take 200 mcg by mouth daily. , Disp: , Rfl:  .  levothyroxine (SYNTHROID, LEVOTHROID) 25 MCG tablet, Take 25 mcg by mouth daily before breakfast. , Disp: , Rfl:  .  Magnesium Oxide 500 MG CAPS, Take 1 capsule (500 mg total) by mouth 2 (two) times daily at 8 am and 10 pm., Disp: 180 capsule, Rfl: 1 .  metFORMIN (GLUCOPHAGE) 500 MG tablet, , Disp: , Rfl:  .  methocarbamol (ROBAXIN) 750 MG tablet, Take 1 tablet (750 mg total) by mouth every 8 (eight) hours as  needed for muscle spasms., Disp: 90 tablet, Rfl: 5 .  montelukast (SINGULAIR) 10 MG tablet, Take 10 mg by mouth daily. , Disp: , Rfl:  .  omega-3 fish oil (MAXEPA) 1000 MG CAPS capsule, , Disp: , Rfl:  .  promethazine (PHENERGAN) 25 MG tablet, Take 1 tablet (25 mg total) by mouth every 6 (six) hours as needed for nausea or vomiting., Disp: 20 tablet, Rfl:  0 .  QUEtiapine (SEROQUEL) 25 MG tablet, TAKE 3 TABLETS (75 MG TOTAL) BY MOUTH ATBEDTIME, Disp: 270 tablet, Rfl: 0 .  traMADol (ULTRAM) 50 MG tablet, Take 1 tablet (50 mg total) by mouth every 6 (six) hours as needed for severe pain., Disp: 90 tablet, Rfl: 2 .  TRELEGY ELLIPTA 100-62.5-25 MCG/INH AEPB, , Disp: , Rfl:  .  bumetanide (BUMEX) 2 MG tablet, Take 1 tablet (2 mg total) by mouth 2 (two) times daily., Disp: 60 tablet, Rfl: 11  ROS  Constitutional: Denies any fever or chills Gastrointestinal: No reported hemesis, hematochezia, vomiting, or acute GI distress Musculoskeletal: Denies any acute onset joint swelling, redness, loss of ROM, or weakness Neurological: No reported episodes of acute onset apraxia, aphasia, dysarthria, agnosia, amnesia, paralysis, loss of coordination, or loss of consciousness  Allergies  Ms. Lueth is allergic to diazepam; levofloxacin; metronidazole; pregabalin; sulfa antibiotics; cephalexin; ciprofloxacin; ziprasidone hcl; and penicillins.  PFSH  Drug: Ms. Malicki  reports that she does not use drugs. Alcohol:  reports that she does not drink alcohol. Tobacco:  reports that she has been smoking cigarettes. She has been smoking about 1.00 pack per day. She has never used smokeless tobacco. Medical:  has a past medical history of Anemia, Anxiety, Asthma, Bipolar disorder ([place]), CAD (coronary artery disease) ([place]), CHF (congestive heart failure) ([place]), COPD (chronic obstructive pulmonary disease) ([place]), Depression ([place]), Diabetes mellitus without complication ([place]), Diabetes mellitus, type II ([place]), Drug overdose, GERD  (gastroesophageal reflux disease), Headache, Hyperlipidemia, Hypertension, Left leg pain (04/29/2014), MI (myocardial infarction) ([place]), Muscle ache (09/16/2014), Osteoporosis, Overactive bladder, Pancreatitis ([place]), Reflex sympathetic dystrophy, Renal insufficiency, Sleep apnea, Sleep apnea, Stroke ([place]), Thyroid disease, TIA (transient ischemic attack) ([place]), and TIA (transient ischemic attack). Surgical: Ms. Railey  has a past surgical history that includes prolapse rectum surgery (N/A, July 2016); Abdominal hysterectomy; Tonsillectomy; and Hernia repair (07/15/2017). Family: family history includes Anxiety disorder in her father and mother; Bipolar disorder in her father and mother; CAD in her mother; Depression in her father and mother; Diabetes Mellitus II in her mother; Hypertension in her father; Osteoarthritis in her mother; Osteoporosis in her mother; Post-traumatic stress disorder in her sister; Sleep apnea in her mother.  Constitutional Exam  General appearance: Well nourished, well developed, and well hydrated. In no apparent acute distress Vitals:   12/23/17 0852  BP: (!) 145/93  Pulse: (!) 107  Resp: 18  Temp: 98.3 F (36.8 C)  TempSrc: Oral  SpO2: 97%  Weight: 210 lb (95.3 kg)  Height: '5\' 4"'  (1.626 m)   BMI Assessment: Estimated body mass index is 36.05 kg/m as calculated from the following:   Height as of this encounter: '5\' 4"'  (1.626 m).   Weight as of this encounter: 210 lb (95.3 kg).  BMI interpretation table: BMI level Category Range association with higher incidence of chronic pain  <18 kg/m2 Underweight   18.5-24.9 kg/m2 Ideal body weight   25-29.9 kg/m2 Overweight Increased incidence by 20%  30-34.9 kg/m2 Obese (Class I) Increased incidence by 68%  35-39.9 kg/m2 Severe obesity (Class II) Increased incidence by 136%  >40 kg/m2 Extreme obesity (Class III) Increased incidence by 254%   Patient's current BMI Ideal Body weight  Body mass index is 36.05 kg/m. Ideal body  weight: 54.7 kg (120 lb 9.5 oz) Adjusted ideal body weight: 70.9 kg (156 lb 5.7 oz)   BMI Readings from Last 4 Encounters:  12/23/17 36.05 kg/m  12/05/17 36.05 kg/m  11/13/17 37.59 kg/m  10/22/17 38.62 kg/m  Wt Readings from Last 4 Encounters:  12/23/17 210 lb (95.3 kg)  12/05/17 210 lb (95.3 kg)  11/13/17 219 lb (99.3 kg)  10/22/17 225 lb (102.1 kg)  Psych/Mental status: Alert, oriented x 3 (person, place, & time)       Eyes: PERLA Respiratory: No evidence of acute respiratory distress  Cervical Spine Area Exam  Skin & Axial Inspection: No masses, redness, edema, swelling, or associated skin lesions Alignment: Symmetrical Functional ROM: Unrestricted ROM      Stability: No instability detected Muscle Tone/Strength: Functionally intact. No obvious neuro-muscular anomalies detected. Sensory (Neurological): Unimpaired Palpation: No palpable anomalies              Upper Extremity (UE) Exam    Side: Right upper extremity  Side: Left upper extremity  Skin & Extremity Inspection: Skin color, temperature, and hair growth are WNL. No peripheral edema or cyanosis. No masses, redness, swelling, asymmetry, or associated skin lesions. No contractures.  Skin & Extremity Inspection: Skin color, temperature, and hair growth are WNL. No peripheral edema or cyanosis. No masses, redness, swelling, asymmetry, or associated skin lesions. No contractures.  Functional ROM: Unrestricted ROM          Functional ROM: Unrestricted ROM          Muscle Tone/Strength: Functionally intact. No obvious neuro-muscular anomalies detected.  Muscle Tone/Strength: Functionally intact. No obvious neuro-muscular anomalies detected.  Sensory (Neurological): Unimpaired          Sensory (Neurological): Unimpaired          Palpation: No palpable anomalies              Palpation: No palpable anomalies              Provocative Test(s):  Phalen's test: deferred Tinel's test: deferred Apley's scratch test (touch opposite  shoulder):  Action 1 (Across chest): deferred Action 2 (Overhead): deferred Action 3 (LB reach): deferred   Provocative Test(s):  Phalen's test: deferred Tinel's test: deferred Apley's scratch test (touch opposite shoulder):  Action 1 (Across chest): deferred Action 2 (Overhead): deferred Action 3 (LB reach): deferred    Thoracic Spine Area Exam  Skin & Axial Inspection: No masses, redness, or swelling Alignment: Symmetrical Functional ROM: Unrestricted ROM Stability: No instability detected Muscle Tone/Strength: Functionally intact. No obvious neuro-muscular anomalies detected. Sensory (Neurological): Unimpaired Muscle strength & Tone: No palpable anomalies  Lumbar Spine Area Exam  Skin & Axial Inspection: No masses, redness, or swelling Alignment: Symmetrical Functional ROM: Unrestricted ROM       Stability: No instability detected Muscle Tone/Strength: Functionally intact. No obvious neuro-muscular anomalies detected. Sensory (Neurological): Unimpaired Palpation: No palpable anomalies       Provocative Tests: Hyperextension/rotation test: deferred today       Lumbar quadrant test (Kemp's test): deferred today       Lateral bending test: deferred today       Patrick's Maneuver: deferred today                   FABER test: deferred today                   S-I anterior distraction/compression test: deferred today         S-I lateral compression test: deferred today         S-I Thigh-thrust test: deferred today         S-I Gaenslen's test: deferred today          Gait & Posture Assessment  Ambulation: Unassisted Gait: Relatively normal for age and body habitus Posture: WNL   Lower Extremity Exam    Side: Right lower extremity  Side: Left lower extremity  Stability: No instability observed          Stability: No instability observed          Skin & Extremity Inspection: Skin color, temperature, and hair growth are WNL. No peripheral edema or cyanosis. No masses,  redness, swelling, asymmetry, or associated skin lesions. No contractures.  Skin & Extremity Inspection: Skin color, temperature, and hair growth are WNL. No peripheral edema or cyanosis. No masses, redness, swelling, asymmetry, or associated skin lesions. No contractures.  Functional ROM: Unrestricted ROM                  Functional ROM: Unrestricted ROM                  Muscle Tone/Strength: Functionally intact. No obvious neuro-muscular anomalies detected.  Muscle Tone/Strength: Functionally intact. No obvious neuro-muscular anomalies detected.  Sensory (Neurological): Unimpaired  Sensory (Neurological): Unimpaired  Palpation: No palpable anomalies  Palpation: No palpable anomalies   Assessment  Primary Diagnosis & Pertinent Problem List: The primary encounter diagnosis was Chronic low back pain (Primary Area of Pain) (Bilateral) (L>R). Diagnoses of Chronic lower extremity pain (Referred) (Secondary Area of Pain) (Left), Chronic ankle pain (Tertiary Area of Pain) (Left), Chronic neck pain (Fourth Area of Pain) (Bilateral) (L>R), Chronic shoulder pain (Fifth Area of Pain) (Bilateral) (L>R), Spondylosis without myelopathy or radiculopathy, lumbosacral region, Lumbar facet syndrome (Bilateral) (L>R), and Lumbar facet hypertrophy were also pertinent to this visit.  Status Diagnosis  Controlled Controlled Controlled 1. Chronic low back pain (Primary Area of Pain) (Bilateral) (L>R)   2. Chronic lower extremity pain (Referred) (Secondary Area of Pain) (Left)   3. Chronic ankle pain [facility] Area of Pain) (Left)   4. Chronic neck pain (Fourth Area of Pain) (Bilateral) (L>R)   5. Chronic shoulder pain (Fifth Area of Pain) (Bilateral) (L>R)   6. Spondylosis without myelopathy or radiculopathy, lumbosacral region   7. Lumbar facet syndrome (Bilateral) (L>R)   8. Lumbar facet hypertrophy     Problems updated and reviewed during this visit: No problems updated. Plan of Care  Pharmacotherapy  (Medications Ordered): No orders of the defined types were placed in this encounter.  Medications administered today: [facility] had no medications administered during this visit.   Procedure Orders     LUMBAR FACET(MEDIAL BRANCH NERVE BLOCK) MBNB     Radiofrequency,Lumbar Lab Orders  No laboratory test(s) ordered today   Imaging Orders  No imaging studies ordered today   Referral Orders  No referral(s) requested today   Interventional management options: Planned, scheduled, and/or pending:   Therapeutic bilateral lumbar facet RFA #1 under fluoroscopic guidance and IV sedation, starting with the left side.   Considering:   Diagnosticbilateral lumbar facet nerve block Possible bilateral lumbar facetRFA Diagnostic left-sided L5-S1 transforaminal ESI Diagnostic left-sided L5 selective nerve root block Possible left-sided L5 DRG RFA Diagnostic left-sidedlumbarsympathetic nerve block Possible left-sided lumbar sympathetic RFA Diagnostic bilateral cervical facet nerve block Possible bilateral cervical facetRFA Diagnosticleft intra-articular shoulder injection Diagnosticleft suprascapular nerve block Diagnostic left suprascapularRFA Possible lumbar spinal cord stimulator trial   Palliative PRN treatment(s):   Palliative bilateral lumbar facet nerve block   Provider-requested follow-up: Return for RFA (fluoro + sedation): (L) L-FCT RFA #1.  Future Appointments  Date Time Provider [place]  01/01/2018  8:30 AM Ursula Alert, MD  [facility] None  01/22/2018  8:45 AM Vevelyn Francois, NP [facility] None   Primary Care Physician: Sharyne Peach, MD Location: [facility] Note by: Gaspar Cola, MD Date: 12/23/2017; Time: 9:16 AM

## 2017-12-22 NOTE — Patient Instructions (Addendum)
____________________________________________________________________________________________  Preparing for Procedure with Sedation  Instructions: . Oral Intake: Do not eat or drink anything for at least 8 hours prior to your procedure. . Transportation: Public transportation is not allowed. Bring an adult driver. The driver must be physically present in our waiting room before any procedure can be started. . Physical Assistance: Bring an adult physically capable of assisting you, in the event you need help. This adult should keep you company at home for at least 6 hours after the procedure. . Blood Pressure Medicine: Take your blood pressure medicine with a sip of water the morning of the procedure. . Blood thinners: Notify our staff if you are taking any blood thinners. Depending on which one you take, there will be specific instructions on how and when to stop it. . Diabetics on insulin: Notify the staff so that you can be scheduled 1st case in the morning. If your diabetes requires high dose insulin, take only  of your normal insulin dose the morning of the procedure and notify the staff that you have done so. . Preventing infections: Shower with an antibacterial soap the morning of your procedure. . Build-up your immune system: Take 1000 mg of Vitamin C with every meal (3 times a day) the day prior to your procedure. . Antibiotics: Inform the staff if you have a condition or reason that requires you to take antibiotics before dental procedures. . Pregnancy: If you are pregnant, call and cancel the procedure. . Sickness: If you have a cold, fever, or any active infections, call and cancel the procedure. . Arrival: You must be in the facility at least 30 minutes prior to your scheduled procedure. . Children: Do not bring children with you. . Dress appropriately: Bring dark clothing that you would not mind if they get stained. . Valuables: Do not bring any jewelry or valuables.  Procedure  appointments are reserved for interventional treatments only. . No Prescription Refills. . No medication changes will be discussed during procedure appointments. . No disability issues will be discussed.  Reasons to call and reschedule or cancel your procedure: (Following these recommendations will minimize the risk of a serious complication.) . Surgeries: Avoid having procedures within 2 weeks of any surgery. (Avoid for 2 weeks before or after any surgery). . Flu Shots: Avoid having procedures within 2 weeks of a flu shots or . (Avoid for 2 weeks before or after immunizations). . Barium: Avoid having a procedure within 7-10 days after having had a radiological study involving the use of radiological contrast. (Myelograms, Barium swallow or enema study). . Heart attacks: Avoid any elective procedures or surgeries for the initial 6 months after a "Myocardial Infarction" (Heart Attack). . Blood thinners: It is imperative that you stop these medications before procedures. Let us know if you if you take any blood thinner.  . Infection: Avoid procedures during or within two weeks of an infection (including chest colds or gastrointestinal problems). Symptoms associated with infections include: Localized redness, fever, chills, night sweats or profuse sweating, burning sensation when voiding, cough, congestion, stuffiness, runny nose, sore throat, diarrhea, nausea, vomiting, cold or Flu symptoms, recent or current infections. It is specially important if the infection is over the area that we intend to treat. . Heart and lung problems: Symptoms that may suggest an active cardiopulmonary problem include: cough, chest pain, breathing difficulties or shortness of breath, dizziness, ankle swelling, uncontrolled high or unusually low blood pressure, and/or palpitations. If you are experiencing any of these symptoms, cancel   your procedure and contact your primary care physician for an evaluation.  Remember:   Regular Business hours are:  Monday to Thursday 8:00 AM to 4:00 PM  Provider's Schedule: Milinda Pointer, MD:  Procedure days: Tuesday and Thursday 7:30 AM to 4:00 PM  Gillis Santa, MD:  Procedure days: Monday and Wednesday 7:30 AM to 4:00 PM ____________________________________________________________________________________________   Facet Blocks Patient Information  Description: The facets are joints in the spine between the vertebrae.  Like any joints in the body, facets can become irritated and painful.  Arthritis can also effect the facets.  By injecting steroids and local anesthetic in and around these joints, we can temporarily block the nerve supply to them.  Steroids act directly on irritated nerves and tissues to reduce selling and inflammation which often leads to decreased pain.  Facet blocks may be done anywhere along the spine from the neck to the low back depending upon the location of your pain.   After numbing the skin with local anesthetic (like Novocaine), a small needle is passed onto the facet joints under x-ray guidance.  You may experience a sensation of pressure while this is being done.  The entire block usually lasts about 15-25 minutes.   Conditions which may be treated by facet blocks:   Low back/buttock pain  Neck/shoulder pain  Certain types of headaches  Preparation for the injection:  1. Do not eat any solid food or dairy products within 8 hours of your appointment. 2. You may drink clear liquid up to 3 hours before appointment.  Clear liquids include water, black coffee, juice or soda.  No milk or cream please. 3. You may take your regular medication, including pain medications, with a sip of water before your appointment.  Diabetics should hold regular insulin (if taken separately) and take 1/2 normal NPH dose the morning of the procedure.  Carry some sugar containing items with you to your appointment. 4. A driver must accompany you and be  prepared to drive you home after your procedure. 5. Bring all your current medications with you. 6. An IV may be inserted and sedation may be given at the discretion of the physician. 7. A blood pressure cuff, EKG and other monitors will often be applied during the procedure.  Some patients may need to have extra oxygen administered for a short period. 8. You will be asked to provide medical information, including your allergies and medications, prior to the procedure.  We must know immediately if you are taking blood thinners (like Coumadin/Warfarin) or if you are allergic to IV iodine contrast (dye).  We must know if you could possible be pregnant.  Possible side-effects:   Bleeding from needle site  Infection (rare, may require surgery)  Nerve injury (rare)  Numbness & tingling (temporary)  Difficulty urinating (rare, temporary)  Spinal headache (a headache worse with upright posture)  Light-headedness (temporary)  Pain at injection site (serveral days)  Decreased blood pressure (rare, temporary)  Weakness in arm/leg (temporary)  Pressure sensation in back/neck (temporary)   Call if you experience:   Fever/chills associated with headache or increased back/neck pain  Headache worsened by an upright position  New onset, weakness or numbness of an extremity below the injection site  Hives or difficulty breathing (go to the emergency room)  Inflammation or drainage at the injection site(s)  Severe back/neck pain greater than usual  New symptoms which are concerning to you  Please note:  Although the local anesthetic injected can often make  your back or neck feel good for several hours after the injection, the pain will likely return. It takes 3-7 days for steroids to work.  You may not notice any pain relief for at least one week.  If effective, we will often do a series of 2-3 injections spaced 3-6 weeks apart to maximally decrease your pain.  After the initial  series, you may be a candidate for a more permanent nerve block of the facets.  If you have any questions, please call #[phone]) [facility] Clinic[facility] Radiofrequency lesioning is a procedure that is performed to relieve pain. The procedure is often used for back, neck, or arm pain. Radiofrequency lesioning involves the use of a machine that creates radio waves to make heat. During the procedure, the heat is applied to the nerve that carries the pain signal. The heat damages the nerve and interferes with the pain signal. Pain relief usually starts about 2 weeks after the procedure and lasts for 6 months to 1 year. Tell a health care provider about:  Any allergies you have.  All medicines you are taking, including vitamins, herbs, eye drops, creams, and over-the-counter medicines.  Any problems you or family members have had with anesthetic medicines.  Any blood disorders you have.  Any surgeries you have had.  Any medical conditions you have.  Whether you are pregnant or may be pregnant. What are the risks? Generally, this is a safe procedure. However, problems may occur, including:  Pain or soreness at the injection site.  Infection at the injection site.  Damage to nerves or blood vessels.  What happens before the procedure?  Ask your health care provider about: ? Changing or stopping your regular medicines. This is especially important if you are taking diabetes medicines or blood thinners. ? Taking medicines such as aspirin and ibuprofen. These medicines can thin your blood. Do not take these medicines before your procedure if your health care provider instructs you not to.  Follow instructions from your health care provider about eating or drinking restrictions.  Plan to have someone take you home after the procedure.  If you go home right after the procedure, plan to have someone with you for 24 hours. What happens  during the procedure?  You will be given one or more of the following: ? A medicine to help you relax (sedative). ? A medicine to numb the area (local anesthetic).  You will be awake during the procedure. You will need to be able to talk with the health care provider during the procedure.  With the help of a type of X-ray (fluoroscopy), the health care provider will insert a radiofrequency needle into the area to be treated.  Next, a wire that carries the radio waves (electrode) will be put through the radiofrequency needle. An electrical pulse will be sent through the electrode to verify the correct nerve. You will feel a tingling sensation, and you may have muscle twitching.  Then, the tissue that is around the needle tip will be heated by an electric current that is passed using the radiofrequency machine. This will numb the nerves.  A bandage (dressing) will be put on the insertion area after the procedure is done. The procedure may vary among health care providers and hospitals. What happens after the procedure?  Your blood pressure, heart rate, breathing rate, and blood oxygen level will be monitored often until the medicines you were given have worn off.  Return to your normal  activities as directed by your health care provider. This information is not intended to replace advice given to you by your health care provider. Make sure you discuss any questions you have with your health care provider. Document Released: 09/27/2010 Document Revised: 07/07/2015 Document Reviewed: 03/08/2014 Elsevier Interactive Patient Education  [employer].

## 2017-12-23 ENCOUNTER — Other Ambulatory Visit: Payer: Self-pay

## 2017-12-23 ENCOUNTER — Ambulatory Visit: Payer: Medicare HMO | Attending: Pain Medicine | Admitting: Pain Medicine

## 2017-12-23 ENCOUNTER — Encounter: Payer: Self-pay | Admitting: Pain Medicine

## 2017-12-23 VITALS — BP 145/93 | HR 107 | Temp 98.3°F | Resp 18 | Ht 64.0 in | Wt 210.0 lb

## 2017-12-23 DIAGNOSIS — Z7982 Long term (current) use of aspirin: Secondary | ICD-10-CM | POA: Insufficient documentation

## 2017-12-23 DIAGNOSIS — F17210 Nicotine dependence, cigarettes, uncomplicated: Secondary | ICD-10-CM | POA: Insufficient documentation

## 2017-12-23 DIAGNOSIS — I509 Heart failure, unspecified: Secondary | ICD-10-CM | POA: Insufficient documentation

## 2017-12-23 DIAGNOSIS — Z7984 Long term (current) use of oral hypoglycemic drugs: Secondary | ICD-10-CM | POA: Diagnosis not present

## 2017-12-23 DIAGNOSIS — E1122 Type 2 diabetes mellitus with diabetic chronic kidney disease: Secondary | ICD-10-CM | POA: Diagnosis not present

## 2017-12-23 DIAGNOSIS — M25511 Pain in right shoulder: Secondary | ICD-10-CM | POA: Diagnosis not present

## 2017-12-23 DIAGNOSIS — I130 Hypertensive heart and chronic kidney disease with heart failure and stage 1 through stage 4 chronic kidney disease, or unspecified chronic kidney disease: Secondary | ICD-10-CM | POA: Insufficient documentation

## 2017-12-23 DIAGNOSIS — M25512 Pain in left shoulder: Secondary | ICD-10-CM | POA: Diagnosis not present

## 2017-12-23 DIAGNOSIS — Z79899 Other long term (current) drug therapy: Secondary | ICD-10-CM | POA: Insufficient documentation

## 2017-12-23 DIAGNOSIS — M47816 Spondylosis without myelopathy or radiculopathy, lumbar region: Secondary | ICD-10-CM

## 2017-12-23 DIAGNOSIS — N183 Chronic kidney disease, stage 3 (moderate): Secondary | ICD-10-CM | POA: Insufficient documentation

## 2017-12-23 DIAGNOSIS — M549 Dorsalgia, unspecified: Secondary | ICD-10-CM | POA: Insufficient documentation

## 2017-12-23 DIAGNOSIS — M542 Cervicalgia: Secondary | ICD-10-CM

## 2017-12-23 DIAGNOSIS — G8929 Other chronic pain: Secondary | ICD-10-CM | POA: Diagnosis not present

## 2017-12-23 DIAGNOSIS — G894 Chronic pain syndrome: Secondary | ICD-10-CM | POA: Diagnosis not present

## 2017-12-23 DIAGNOSIS — M47817 Spondylosis without myelopathy or radiculopathy, lumbosacral region: Secondary | ICD-10-CM | POA: Diagnosis not present

## 2017-12-23 DIAGNOSIS — M48061 Spinal stenosis, lumbar region without neurogenic claudication: Secondary | ICD-10-CM | POA: Insufficient documentation

## 2017-12-23 DIAGNOSIS — X58XXXA Exposure to other specified factors, initial encounter: Secondary | ICD-10-CM | POA: Diagnosis not present

## 2017-12-23 DIAGNOSIS — F319 Bipolar disorder, unspecified: Secondary | ICD-10-CM | POA: Diagnosis not present

## 2017-12-23 DIAGNOSIS — Z79890 Hormone replacement therapy (postmenopausal): Secondary | ICD-10-CM | POA: Insufficient documentation

## 2017-12-23 DIAGNOSIS — Z880 Allergy status to penicillin: Secondary | ICD-10-CM | POA: Insufficient documentation

## 2017-12-23 DIAGNOSIS — Z882 Allergy status to sulfonamides status: Secondary | ICD-10-CM | POA: Insufficient documentation

## 2017-12-23 DIAGNOSIS — Z79891 Long term (current) use of opiate analgesic: Secondary | ICD-10-CM | POA: Diagnosis not present

## 2017-12-23 DIAGNOSIS — I2510 Atherosclerotic heart disease of native coronary artery without angina pectoris: Secondary | ICD-10-CM | POA: Diagnosis not present

## 2017-12-23 DIAGNOSIS — Z881 Allergy status to other antibiotic agents status: Secondary | ICD-10-CM | POA: Diagnosis not present

## 2017-12-23 DIAGNOSIS — M25572 Pain in left ankle and joints of left foot: Secondary | ICD-10-CM | POA: Diagnosis not present

## 2017-12-23 DIAGNOSIS — M545 Low back pain, unspecified: Secondary | ICD-10-CM

## 2017-12-23 DIAGNOSIS — E785 Hyperlipidemia, unspecified: Secondary | ICD-10-CM | POA: Diagnosis not present

## 2017-12-23 DIAGNOSIS — M79605 Pain in left leg: Secondary | ICD-10-CM

## 2017-12-23 DIAGNOSIS — S8002XA Contusion of left knee, initial encounter: Secondary | ICD-10-CM | POA: Insufficient documentation

## 2017-12-23 DIAGNOSIS — K625 Hemorrhage of anus and rectum: Secondary | ICD-10-CM | POA: Insufficient documentation

## 2017-12-23 DIAGNOSIS — J449 Chronic obstructive pulmonary disease, unspecified: Secondary | ICD-10-CM | POA: Insufficient documentation

## 2017-12-23 NOTE — Progress Notes (Signed)
Safety precautions to be maintained throughout the outpatient stay will include: orient to surroundings, keep bed in low position, maintain call bell within reach at all times, provide assistance with transfer out of bed and ambulation.  

## 2017-12-24 DIAGNOSIS — I2510 Atherosclerotic heart disease of native coronary artery without angina pectoris: Secondary | ICD-10-CM | POA: Diagnosis not present

## 2017-12-25 DIAGNOSIS — I2510 Atherosclerotic heart disease of native coronary artery without angina pectoris: Secondary | ICD-10-CM | POA: Diagnosis not present

## 2017-12-25 DIAGNOSIS — N3941 Urge incontinence: Secondary | ICD-10-CM | POA: Diagnosis not present

## 2017-12-26 DIAGNOSIS — I2510 Atherosclerotic heart disease of native coronary artery without angina pectoris: Secondary | ICD-10-CM | POA: Diagnosis not present

## 2017-12-27 DIAGNOSIS — I2510 Atherosclerotic heart disease of native coronary artery without angina pectoris: Secondary | ICD-10-CM | POA: Diagnosis not present

## 2017-12-28 DIAGNOSIS — I2510 Atherosclerotic heart disease of native coronary artery without angina pectoris: Secondary | ICD-10-CM | POA: Diagnosis not present

## 2017-12-29 DIAGNOSIS — I2510 Atherosclerotic heart disease of native coronary artery without angina pectoris: Secondary | ICD-10-CM | POA: Diagnosis not present

## 2017-12-30 DIAGNOSIS — I2510 Atherosclerotic heart disease of native coronary artery without angina pectoris: Secondary | ICD-10-CM | POA: Diagnosis not present

## 2017-12-31 DIAGNOSIS — I2510 Atherosclerotic heart disease of native coronary artery without angina pectoris: Secondary | ICD-10-CM | POA: Diagnosis not present

## 2018-01-01 ENCOUNTER — Encounter: Payer: Self-pay | Admitting: Psychiatry

## 2018-01-01 ENCOUNTER — Other Ambulatory Visit: Payer: Self-pay

## 2018-01-01 ENCOUNTER — Ambulatory Visit (INDEPENDENT_AMBULATORY_CARE_PROVIDER_SITE_OTHER): Payer: Medicare HMO | Admitting: Psychiatry

## 2018-01-01 VITALS — BP 132/90 | HR 105 | Temp 98.9°F | Wt 224.4 lb

## 2018-01-01 DIAGNOSIS — F4310 Post-traumatic stress disorder, unspecified: Secondary | ICD-10-CM

## 2018-01-01 DIAGNOSIS — I2510 Atherosclerotic heart disease of native coronary artery without angina pectoris: Secondary | ICD-10-CM | POA: Diagnosis not present

## 2018-01-01 DIAGNOSIS — F17200 Nicotine dependence, unspecified, uncomplicated: Secondary | ICD-10-CM | POA: Diagnosis not present

## 2018-01-01 DIAGNOSIS — F3160 Bipolar disorder, current episode mixed, unspecified: Secondary | ICD-10-CM | POA: Diagnosis not present

## 2018-01-01 MED ORDER — LAMOTRIGINE 25 MG PO TABS: 75.0000 mg | ORAL_TABLET | Freq: Every day | ORAL | 0 refills | Status: DC

## 2018-01-01 MED ORDER — BUSPIRONE HCL 10 MG PO TABS: 20.0000 mg | ORAL_TABLET | Freq: Three times a day (TID) | ORAL | 1 refills | Status: DC

## 2018-01-01 MED ORDER — QUETIAPINE FUMARATE 25 MG PO TABS: ORAL_TABLET | ORAL | 0 refills | Status: DC

## 2018-01-01 NOTE — Progress Notes (Signed)
[facility] MD OP Progress Note  01/01/2018 4:15 PM Marie Clarke  MRN:  [id number]  Chief Complaint: ' I am here for follow up." Chief Complaint    Follow-up; Medication Refill     HPI: Marie Clarke is a 47 year old Caucasian female, single on SSD, lives in [place], has a history of bipolar disorder, COPD, OSA on CPAP, coronary artery disease, hyperlipidemia, hypertension, hypothyroidism ,chronic back pain, recent abdominal surgery, presented to the clinic today for a follow-up visit.  Patient today reports she continues to struggle with some mood lability.  She reports there are times when she feels hypomanic and other times when she feels depressed.  She also reports anxiety symptoms on and off.  She reports she has the situational stressor of taking care of her uncle's sister who lives in the same house.  Patient reports her uncle's sister has dementia and hence needs a lot of care.  Patient reports Seroquel as helpful for her mood and sleep however there are times when she feels some restlessness at night.  She reports she has chronic back pain which could also be contributing to her sleep issues.  Patient reports she has upcoming appointment for a procedure to be done to her back for the pain.  She looks forward to that.  Patient denies any suicidality.  Patient denies any perceptual disturbances.  Patient denies any other concerns today. Visit Diagnosis:    ICD-10-CM   1. Bipolar I disorder, most recent episode mixed (HCC) F31.60 busPIRone (BUSPAR) 10 MG tablet    lamoTRIgine (LAMICTAL) 25 MG tablet    QUEtiapine (SEROQUEL) 25 MG tablet  2. PTSD (post-traumatic stress disorder) F43.10 lamoTRIgine (LAMICTAL) 25 MG tablet  3. Tobacco use disorder F17.200     Past Psychiatric History: I have reviewed past psychiatric history from my progress note on 03/06/2017  Past Medical History:  Past Medical History:  Diagnosis Date  . Anemia   . Anxiety   . Asthma   . Bipolar disorder ([place])   . CAD  (coronary artery disease) unk  . CHF (congestive heart failure) ([place])   . COPD (chronic obstructive pulmonary disease) ([place])   . Depression unk  . Diabetes mellitus without complication ([place])   . Diabetes mellitus, type II ([place])   . Drug overdose   . GERD (gastroesophageal reflux disease)   . Headache   . Hyperlipidemia   . Hypertension   . Left leg pain 04/29/2014  . MI (myocardial infarction) ([place])   . Muscle ache 09/16/2014  . Osteoporosis   . Overactive bladder   . Pancreatitis unk  . Reflex sympathetic dystrophy   . Renal insufficiency   . Sleep apnea    pt reported on 2/6/7 she currently is not using CPAP  . Sleep apnea   . Stroke ([place])   . Thyroid disease   . TIA (transient ischemic attack) unk  . TIA (transient ischemic attack)     Past Surgical History:  Procedure Laterality Date  . ABDOMINAL HYSTERECTOMY    . HERNIA REPAIR  07/15/2017  . prolapse rectum surgery N/A July 2016  . TONSILLECTOMY      Family Psychiatric History: Reviewed family psychiatric history from my progress note on 03/06/2017  Family History:  Family History  Problem Relation Age of Onset  . Diabetes Mellitus II Mother   . CAD Mother   . Sleep apnea Mother   . Osteoarthritis Mother   . Osteoporosis Mother   . Anxiety disorder Mother   . Depression Mother   .  Bipolar disorder Mother   . Bipolar disorder Father   . Hypertension Father   . Depression Father   . Anxiety disorder Father   . Post-traumatic stress disorder Sister     Social History: Have reviewed social history from my progress note on 03/06/2017 Social History   Socioeconomic History  . Marital status: Single    Spouse name: Not on file  . Number of children: 0  . Years of education: Not on file  . Highest education level: High school graduate  Occupational History    Comment: not employed  Social Needs  . Financial resource strain: Not hard at all  . Food insecurity:    Worry: Never true    Inability: Never true   . Transportation needs:    Medical: Yes    Non-medical: Yes  Tobacco Use  . Smoking status: Current Every Day Smoker    Packs/day: 1.00    Types: Cigarettes    Last attempt to quit: 06/12/2016    Years since quitting: 1.5  . Smokeless tobacco: Never Used  Substance and Sexual Activity  . Alcohol use: No    Alcohol/week: 0.0 standard drinks  . Drug use: No  . Sexual activity: Not Currently  Lifestyle  . Physical activity:    Days per week: 0 days    Minutes per session: 0 min  . Stress: Not at all  Relationships  . Social connections:    Talks on phone: More than three times a week    Gets together: More than three times a week    Attends religious service: More than 4 times per year    Active member of club or organization: No    Attends meetings of clubs or organizations: Never    Relationship status: Never married  Other Topics Concern  . Not on file  Social History Narrative  . Not on file    Allergies:  Allergies  Allergen Reactions  . Diazepam Hives and Nausea And Vomiting  . Levofloxacin Hives and Rash    1/31: would like to retry since not taking valium  . Metronidazole Hives    1/31: would like to retry since she is no longer taking valium  . Pregabalin Hives and Rash    1/31 currently taking lyrica without reaction since no longer taking valium  . Sulfa Antibiotics Hives  . Cephalexin Hives  . Ciprofloxacin Hives  . Ziprasidone Hcl Other (See Comments)    CONFUSED CONFUSED Other reaction(s): Other (See Comments) CONFUSED CONFUSED   . Penicillins Nausea And Vomiting    Has patient had a PCN reaction causing immediate rash, facial/tongue/throat swelling, SOB or lightheadedness with hypotension: No Has patient had a PCN reaction causing severe rash involving mucus membranes or skin necrosis: No Has patient had a PCN reaction that required hospitalization: No Has patient had a PCN reaction occurring within the last 10 years: No If all of the above  answers are "NO", then may proceed with Cephalosporin use.     Metabolic Disorder Labs: No results found for: HGBA1C, MPG No results found for: PROLACTIN No results found for: CHOL, TRIG, HDL, CHOLHDL, VLDL, LDLCALC No results found for: TSH  Therapeutic Level Labs: No results found for: LITHIUM No results found for: VALPROATE No components found for:  CBMZ  Current Medications: Current Outpatient Medications  Medication Sig Dispense Refill  . acetaminophen (TYLENOL) 325 MG tablet Take 1 tablet (325 mg total) by mouth every 6 (six) hours as needed for mild pain (  or Fever >/= 101).    Marland Kitchen aspirin EC 81 MG tablet Take 81 mg by mouth daily at 12 noon.    Marland Kitchen atorvastatin (LIPITOR) 40 MG tablet Take 40 mg by mouth at bedtime.     Marland Kitchen azelastine (ASTELIN) 0.1 % nasal spray Place 1 spray into both nostrils daily.    . busPIRone (BUSPAR) 10 MG tablet Take 2 tablets (20 mg total) by mouth 3 (three) times daily. 540 tablet 1  . diphenoxylate-atropine (LOMOTIL) 2.5-0.025 MG tablet Take by mouth.    . gabapentin (NEURONTIN) 600 MG tablet Take 2 tablets (1,200 mg total) by mouth 3 (three) times daily. 180 tablet 5  . hydrOXYzine (ATARAX/VISTARIL) 10 MG tablet Take 1-3 tablets (10-30 mg total) by mouth 2 (two) times daily as needed (for severe anxiety sx). 300 tablet 0  . hydrOXYzine (VISTARIL) 25 MG capsule     . KLOR-CON M20 20 MEQ tablet Take 20 mEq by mouth daily.     Marland Kitchen lamoTRIgine (LAMICTAL) 25 MG tablet Take 3 tablets (75 mg total) by mouth daily. 270 tablet 0  . Lancets (ACCU-CHEK SAFE-T PRO) lancets     . levocetirizine (XYZAL) 5 MG tablet Take 5 mg by mouth every evening.     Marland Kitchen levothyroxine (SYNTHROID, LEVOTHROID) 200 MCG tablet Take 200 mcg by mouth daily.     . Magnesium Oxide 500 MG CAPS Take 1 capsule (500 mg total) by mouth 2 (two) times daily at 8 am and 10 pm. 180 capsule 1  . metFORMIN (GLUCOPHAGE) 500 MG tablet     . methocarbamol (ROBAXIN) 750 MG tablet Take 1 tablet (750 mg total)  by mouth every 8 (eight) hours as needed for muscle spasms. 90 tablet 5  . montelukast (SINGULAIR) 10 MG tablet Take 10 mg by mouth daily.     Marland Kitchen MYRBETRIQ 50 MG TB24 tablet     . NICOTROL 10 MG inhaler     . nortriptyline (PAMELOR) 10 MG capsule     . omega-3 fish oil (MAXEPA) 1000 MG CAPS capsule     . promethazine (PHENERGAN) 25 MG tablet Take 1 tablet (25 mg total) by mouth every 6 (six) hours as needed for nausea or vomiting. 20 tablet 0  . QUEtiapine (SEROQUEL) 25 MG tablet TAKE 3 TABLETS (75 MG TOTAL) BY MOUTH ATBEDTIME 270 tablet 0  . traMADol (ULTRAM) 50 MG tablet Take 1 tablet (50 mg total) by mouth every 6 (six) hours as needed for severe pain. 90 tablet 2  . TRELEGY ELLIPTA 100-62.5-25 MCG/INH AEPB     . bumetanide (BUMEX) 2 MG tablet Take 1 tablet (2 mg total) by mouth 2 (two) times daily. 60 tablet 11  . levothyroxine (SYNTHROID, LEVOTHROID) 25 MCG tablet Take 25 mcg by mouth daily before breakfast.      No current facility-administered medications for this visit.      Musculoskeletal: Strength & Muscle Tone: within normal limits Gait & Station: normal Patient leans: N/A  Psychiatric Specialty Exam: Review of Systems  Psychiatric/Behavioral: The patient is nervous/anxious.   All other systems reviewed and are negative.   Blood pressure 132/90, pulse (!) 105, temperature 98.9 F (37.2 C), temperature source Oral, weight 224 lb 6.4 oz (101.8 kg).Body mass index is 38.52 kg/m.  General Appearance: Casual  Eye Contact:  Fair  Speech:  Clear and Coherent  Volume:  Normal  Mood:  Anxious  Affect:  Congruent  Thought Process:  Goal Directed and Descriptions of Associations: Intact  Orientation:  Full (Time, Place, and Person)  Thought Content: Logical   Suicidal Thoughts:  No  Homicidal Thoughts:  No  Memory:  Immediate;   Fair Recent;   Fair Remote;   Fair  Judgement:  Fair  Insight:  Fair  Psychomotor Activity:  Normal  Concentration:  Concentration: Fair and  Attention Span: Fair  Recall:  [employer] of Knowledge: Fair  Language: Fair  Akathisia:  No  Handed:  Right  AIMS (if indicated): 0  Assets:  Communication Skills Desire for Improvement Social Support  ADL's:  Intact  Cognition: WNL  Sleep:  Fair   Screenings: PHQ2-9     Office Visit from 12/23/2017 in [place] Procedure visit from 12/05/2017 in [place] Office Visit from 11/13/2017 in [place] Procedure visit from 10/22/2017 in [place] Office Visit from 10/02/2017 in [place]  PHQ-2 Total Score  0  0  0  0  0       Assessment and Plan: Marie Clarke is a 47 year old Caucasian female who has a history of bipolar disorder, multiple medical problems, presented to the clinic today for a follow-up visit.  Patient today reports she continues to struggle with some mood swings and anxiety.  She also has restlessness at night but she attributes that to her pain and she has upcoming procedure for the pain.  Discussed following medication changes with patient.  Plan For bipolar disorder Increase Lamictal 75 mg p.o. daily. Continue BuSpar 20 mg p.o. 3 times daily. Seroquel 75 mg p.o. daily  Tobacco use disorder Provided smoking cessation counseling.  Rule out memory problems-MMSE done on 10/02/2017-30 out of 30. We will continue to monitor closely.  She denies any concerns today.    Follow-up in clinic in 2 months or sooner if needed.  More than 50 % of the time was spent for psychoeducation and supportive psychotherapy and care coordination.  This note was generated in part or whole with voice recognition software. Voice recognition is usually quite accurate but there are transcription errors that can and very often do occur. I apologize for any typographical errors  that were not detected and corrected.         Ursula Alert, MD 01/01/2018, 4:15 PM

## 2018-01-02 DIAGNOSIS — I2510 Atherosclerotic heart disease of native coronary artery without angina pectoris: Secondary | ICD-10-CM | POA: Diagnosis not present

## 2018-01-03 DIAGNOSIS — I2510 Atherosclerotic heart disease of native coronary artery without angina pectoris: Secondary | ICD-10-CM | POA: Diagnosis not present

## 2018-01-04 DIAGNOSIS — I2510 Atherosclerotic heart disease of native coronary artery without angina pectoris: Secondary | ICD-10-CM | POA: Diagnosis not present

## 2018-01-05 DIAGNOSIS — I2510 Atherosclerotic heart disease of native coronary artery without angina pectoris: Secondary | ICD-10-CM | POA: Diagnosis not present

## 2018-01-06 DIAGNOSIS — J019 Acute sinusitis, unspecified: Secondary | ICD-10-CM | POA: Diagnosis not present

## 2018-01-06 DIAGNOSIS — I2510 Atherosclerotic heart disease of native coronary artery without angina pectoris: Secondary | ICD-10-CM | POA: Diagnosis not present

## 2018-01-06 DIAGNOSIS — J029 Acute pharyngitis, unspecified: Secondary | ICD-10-CM | POA: Diagnosis not present

## 2018-01-06 DIAGNOSIS — R6889 Other general symptoms and signs: Secondary | ICD-10-CM | POA: Diagnosis not present

## 2018-01-06 DIAGNOSIS — R05 Cough: Secondary | ICD-10-CM | POA: Diagnosis not present

## 2018-01-07 DIAGNOSIS — I2510 Atherosclerotic heart disease of native coronary artery without angina pectoris: Secondary | ICD-10-CM | POA: Diagnosis not present

## 2018-01-08 DIAGNOSIS — I2510 Atherosclerotic heart disease of native coronary artery without angina pectoris: Secondary | ICD-10-CM | POA: Diagnosis not present

## 2018-01-09 DIAGNOSIS — I2510 Atherosclerotic heart disease of native coronary artery without angina pectoris: Secondary | ICD-10-CM | POA: Diagnosis not present

## 2018-01-10 DIAGNOSIS — I2510 Atherosclerotic heart disease of native coronary artery without angina pectoris: Secondary | ICD-10-CM | POA: Diagnosis not present

## 2018-01-11 DIAGNOSIS — I2510 Atherosclerotic heart disease of native coronary artery without angina pectoris: Secondary | ICD-10-CM | POA: Diagnosis not present

## 2018-01-12 DIAGNOSIS — G4733 Obstructive sleep apnea (adult) (pediatric): Secondary | ICD-10-CM | POA: Diagnosis not present

## 2018-01-12 DIAGNOSIS — I2510 Atherosclerotic heart disease of native coronary artery without angina pectoris: Secondary | ICD-10-CM | POA: Diagnosis not present

## 2018-01-20 DIAGNOSIS — I2510 Atherosclerotic heart disease of native coronary artery without angina pectoris: Secondary | ICD-10-CM | POA: Diagnosis not present

## 2018-01-21 DIAGNOSIS — I2510 Atherosclerotic heart disease of native coronary artery without angina pectoris: Secondary | ICD-10-CM | POA: Diagnosis not present

## 2018-01-22 ENCOUNTER — Encounter: Payer: Self-pay | Admitting: Nurse Practitioner

## 2018-01-22 ENCOUNTER — Ambulatory Visit: Payer: Medicare HMO | Attending: Nurse Practitioner | Admitting: Nurse Practitioner

## 2018-01-22 VITALS — BP 104/77 | HR 111 | Temp 98.7°F | Resp 16 | Ht 64.0 in | Wt 215.0 lb

## 2018-01-22 DIAGNOSIS — G90529 Complex regional pain syndrome I of unspecified lower limb: Secondary | ICD-10-CM | POA: Insufficient documentation

## 2018-01-22 DIAGNOSIS — J449 Chronic obstructive pulmonary disease, unspecified: Secondary | ICD-10-CM | POA: Insufficient documentation

## 2018-01-22 DIAGNOSIS — Z881 Allergy status to other antibiotic agents status: Secondary | ICD-10-CM | POA: Diagnosis not present

## 2018-01-22 DIAGNOSIS — Z7984 Long term (current) use of oral hypoglycemic drugs: Secondary | ICD-10-CM | POA: Insufficient documentation

## 2018-01-22 DIAGNOSIS — M79605 Pain in left leg: Secondary | ICD-10-CM | POA: Insufficient documentation

## 2018-01-22 DIAGNOSIS — Z5181 Encounter for therapeutic drug level monitoring: Secondary | ICD-10-CM | POA: Insufficient documentation

## 2018-01-22 DIAGNOSIS — Z880 Allergy status to penicillin: Secondary | ICD-10-CM | POA: Diagnosis not present

## 2018-01-22 DIAGNOSIS — I509 Heart failure, unspecified: Secondary | ICD-10-CM | POA: Insufficient documentation

## 2018-01-22 DIAGNOSIS — Z882 Allergy status to sulfonamides status: Secondary | ICD-10-CM | POA: Insufficient documentation

## 2018-01-22 DIAGNOSIS — M7918 Myalgia, other site: Secondary | ICD-10-CM

## 2018-01-22 DIAGNOSIS — G894 Chronic pain syndrome: Secondary | ICD-10-CM | POA: Diagnosis not present

## 2018-01-22 DIAGNOSIS — Z8249 Family history of ischemic heart disease and other diseases of the circulatory system: Secondary | ICD-10-CM | POA: Diagnosis not present

## 2018-01-22 DIAGNOSIS — I130 Hypertensive heart and chronic kidney disease with heart failure and stage 1 through stage 4 chronic kidney disease, or unspecified chronic kidney disease: Secondary | ICD-10-CM | POA: Insufficient documentation

## 2018-01-22 DIAGNOSIS — E1122 Type 2 diabetes mellitus with diabetic chronic kidney disease: Secondary | ICD-10-CM | POA: Diagnosis not present

## 2018-01-22 DIAGNOSIS — K219 Gastro-esophageal reflux disease without esophagitis: Secondary | ICD-10-CM

## 2018-01-22 DIAGNOSIS — M47817 Spondylosis without myelopathy or radiculopathy, lumbosacral region: Secondary | ICD-10-CM | POA: Diagnosis not present

## 2018-01-22 DIAGNOSIS — M549 Dorsalgia, unspecified: Secondary | ICD-10-CM | POA: Diagnosis not present

## 2018-01-22 DIAGNOSIS — Z79891 Long term (current) use of opiate analgesic: Secondary | ICD-10-CM | POA: Insufficient documentation

## 2018-01-22 DIAGNOSIS — Z833 Family history of diabetes mellitus: Secondary | ICD-10-CM | POA: Diagnosis not present

## 2018-01-22 DIAGNOSIS — F17210 Nicotine dependence, cigarettes, uncomplicated: Secondary | ICD-10-CM | POA: Diagnosis not present

## 2018-01-22 DIAGNOSIS — F419 Anxiety disorder, unspecified: Secondary | ICD-10-CM | POA: Insufficient documentation

## 2018-01-22 DIAGNOSIS — G90522 Complex regional pain syndrome I of left lower limb: Secondary | ICD-10-CM

## 2018-01-22 DIAGNOSIS — Z8673 Personal history of transient ischemic attack (TIA), and cerebral infarction without residual deficits: Secondary | ICD-10-CM | POA: Diagnosis not present

## 2018-01-22 DIAGNOSIS — G8929 Other chronic pain: Secondary | ICD-10-CM

## 2018-01-22 DIAGNOSIS — I252 Old myocardial infarction: Secondary | ICD-10-CM | POA: Insufficient documentation

## 2018-01-22 DIAGNOSIS — E785 Hyperlipidemia, unspecified: Secondary | ICD-10-CM | POA: Insufficient documentation

## 2018-01-22 DIAGNOSIS — I2510 Atherosclerotic heart disease of native coronary artery without angina pectoris: Secondary | ICD-10-CM | POA: Diagnosis not present

## 2018-01-22 DIAGNOSIS — Z7982 Long term (current) use of aspirin: Secondary | ICD-10-CM | POA: Insufficient documentation

## 2018-01-22 MED ORDER — METHOCARBAMOL 750 MG PO TABS: 750.0000 mg | ORAL_TABLET | Freq: Three times a day (TID) | ORAL | 5 refills | Status: DC | PRN

## 2018-01-22 MED ORDER — MAGNESIUM OXIDE -MG SUPPLEMENT 500 MG PO CAPS: 1.0000 | ORAL_CAPSULE | Freq: Two times a day (BID) | ORAL | 1 refills | Status: DC

## 2018-01-22 MED ORDER — TRAMADOL HCL 50 MG PO TABS: 50.0000 mg | ORAL_TABLET | Freq: Four times a day (QID) | ORAL | 2 refills | Status: DC | PRN

## 2018-01-22 NOTE — Progress Notes (Signed)
Patient's Name: Marie Clarke  MRN: [id number]  Referring Provider: Sharyne Peach, MD  DOB: 1970/04/13  PCP: Sharyne Peach, MD  DOS: 01/22/2018  Note by: Vevelyn Francois NP  Service setting: Ambulatory outpatient  Specialty: Interventional Pain Management  Location: [facility] (AMB) Pain Management Facility    Patient type: Established    Primary Reason(s) for Visit: Encounter for prescription drug management. (Level of risk: moderate)  CC: Back Pain (lumbar worse on the left )  HPI  Marie Clarke is a 47 y.o. year old, female patient, who comes today for a medication management evaluation. She has Affective bipolar disorder ([place]); Arteriosclerosis of coronary artery; CCF (congestive cardiac failure) ([place]); Chronic kidney disease, stage III (moderate) ([place]); Chronic kidney disease; Controlled diabetes mellitus type II without complication ([place]); Essential (primary) hypertension; Polypharmacy; Adult hypothyroidism; Detrusor muscle hypertonia; Algodystrophic syndrome; Apnea, sleep; Temporary cerebral vascular dysfunction; Rectal prolapse; Rectal bleeding; Rectal bleed; Detrusor dyssynergia; Diabetes mellitus, type 2 ([place]); Abdominal wall abscess; Bipolar affective disorder ([place]); Congestive heart failure ([place]); Controlled type 2 diabetes mellitus without complication ([place]); Other long term (current) drug therapy; Type 2 diabetes mellitus ([place]); Abnormal gait; Neurosis, posttraumatic; Chronic pain; Chronic low back pain (Primary Area of Pain) (Bilateral) (L>R); Chronic lower extremity pain (Referred) (Secondary Area of Pain) (Left); Abdominal wound dehiscence; Encounter for pain management planning; Morbid obesity ([place]); Abnormal CT scan, lumbar spine; Lumbar facet hypertrophy; Lumbar facet syndrome (Bilateral) (L>R); Lumbar foraminal stenosis (Bilateral) (L5-S1); Chronic ankle pain [facility] Area of Pain) (Left); Neurogenic pain; Neuropathic pain; Myofascial pain; History of suicide attempt; Incidental lung nodule;  Contusion of knee (Left); Strain of knee (Left); Hypotension; Sepsis ([place]); Osteopenia; Strain of extensor muscle, fascia and tendon of left index finger at wrist and hand level, initial encounter; Tobacco use disorder; Ventral hernia without obstruction or gangrene; Chronic neck pain (Fourth Area of Pain) (Bilateral) (L>R); Pharmacologic therapy; Disorder of skeletal system; Problems influencing health status; Long term current use of opiate analgesic; Elevated C-reactive protein (CRP); Elevated sed rate; Chronic pain syndrome; Spondylosis without myelopathy or radiculopathy, lumbosacral region; Chronic musculoskeletal pain; Chronic shoulder pain (Fifth Area of Pain) (Bilateral) (L>R); CRPS (complex regional pain syndrome) type 1 of lower limb (left ankle); Ankle joint instability (Left); Ankle sprain, sequela (Left); History of psychiatric symptoms; History of postoperative nausea; Disease related peripheral neuropathy; Gastroesophageal reflux disease without esophagitis; Occipital headache (Bilateral); and Cervicogenic headache (Bilateral) (L>R) on their problem list. Her primarily concern today is the Back Pain (lumbar worse on the left )  Pain Assessment: Location: Mid, Left, Right Back Radiating: into the left leg going all the way down to Left ankle  Onset: More than a month ago Duration: Chronic pain Quality: Sharp, Stabbing, Dull, Discomfort, Constant Severity: 8 /10 (subjective, self-reported pain score)  Note: Reported level is compatible with observation. Clinically the patient looks like a 46/10 A 2/10 is viewed as "Mild to Moderate" and described as noticeable and distracting. Impossible to hide from other people. More frequent flare-ups. Still possible to adapt and function close to normal. It can be very annoying and may have occasional stronger flare-ups. With discipline, patients may get used to it and adapt. Information on the proper use of the pain scale provided to the patient today.  When using our objective Pain Scale, levels between 6 and 10/10 are said to belong in an emergency room, as it progressively worsens from a 6/10, described as severely limiting, requiring emergency care not usually available at an outpatient pain management facility. At  a 6/10 level, communication becomes difficult and requires great effort. Assistance to reach the emergency department may be required. Facial flushing and profuse sweating along with potentially dangerous increases in heart rate and blood pressure will be evident. Effect on ADL: having muscle cramps, weather changes have really increased the pain.  Timing: Constant Modifying factors: medications, muscle relaxers, heat  BP: 104/77  HR: (!) 111  Marie Clarke was last scheduled for an appointment on 09/09/2017 for medication management. During today's appointment we reviewed Marie Clarke's chronic pain status, as well as her outpatient medication regimen. She admits that she only uses the Tramadol regularly. She admits that she does not want to overuse. She admits that she has been clean.   The patient  reports that she does not use drugs. Her body mass index is 36.9 kg/m.  Further details on both, my assessment(s), as well as the proposed treatment plan, please see below.  Controlled Substance Pharmacotherapy Assessment REMS (Risk Evaluation and Mitigation Strategy)  Analgesic: Tramadol 50 mg 1 tablet 4 times daily (fill date 09/02/2017) tramadol 200 mg/day Highest recorded MME/day:71m/day PJanett Billow RN  01/22/2018  9:13 AM  Sign at close encounter Nursing Pain Medication Assessment:  Safety precautions to be maintained throughout the outpatient stay will include: orient to surroundings, keep bed in low position, maintain call bell within reach at all times, provide assistance with transfer out of bed and ambulation.  Medication Inspection Compliance: Pill count conducted under aseptic conditions, in front of the patient.  Neither the pills nor the bottle was removed from the patient's sight at any time. Once count was completed pills were immediately returned to the patient in their original bottle.  Medication: Tramadol (Ultram) Pill/Patch Count: 53 of 90 pills remain Pill/Patch Appearance: Markings consistent with prescribed medication Bottle Appearance: Standard pharmacy container. Clearly labeled. Filled Date: 146/ 11 / 2019 Last Medication intake:  Today Pharmacokinetics: Liberation and absorption (onset of action): WNL Distribution (time to peak effect): WNL Metabolism and excretion (duration of action): WNL         Pharmacodynamics: Desired effects: Analgesia: Marie Clarke >50% benefit. Functional ability: Patient reports that medication allows her to accomplish basic ADLs Clinically meaningful improvement in function (CMIF): Sustained CMIF goals met Perceived effectiveness: Described as relatively effective, allowing for increase in activities of daily living (ADL) Undesirable effects: Side-effects or Adverse reactions: None reported Monitoring: [place] PMP: Online review of the past 121-montheriod conducted. Compliant with practice rules and regulations Last UDS on record: Summary  Date Value Ref Range Status  09/09/2017 FINAL  Final    Comment:    ==================================================================== TOXASSURE COMP DRUG ANALYSIS,UR ==================================================================== Test                             Result       Flag       Units Drug Present and Declared for Prescription Verification   Tramadol                       6446         EXPECTED   ng/mg creat   O-Desmethyltramadol            4034         EXPECTED   ng/mg creat   N-Desmethyltramadol            1874         EXPECTED  ng/mg creat    Source of tramadol is a prescription medication.    O-desmethyltramadol and N-desmethyltramadol are expected    metabolites of tramadol.   Gabapentin                      PRESENT      EXPECTED   Methocarbamol                  PRESENT      EXPECTED   Quetiapine                     PRESENT      EXPECTED   Ibuprofen                      PRESENT      EXPECTED   Hydroxyzine                    PRESENT      EXPECTED Drug Present not Declared for Prescription Verification   Nortriptyline                  PRESENT      UNEXPECTED    Nortriptyline may be administered as a prescription drug; it is    also an expected metabolite of amitriptyline.   Naproxen                       PRESENT      UNEXPECTED Drug Absent but Declared for Prescription Verification   Hydrocodone                    Not Detected UNEXPECTED ng/mg creat   Oxycodone                      Not Detected UNEXPECTED ng/mg creat   Lamotrigine                    Not Detected UNEXPECTED   Acetaminophen                  Not Detected UNEXPECTED    Acetaminophen, as indicated in the declared medication list, is    not always detected even when used as directed.   Salicylate                     Not Detected UNEXPECTED    Aspirin, as indicated in the declared medication list, is not    always detected even when used as directed.   Promethazine                   Not Detected UNEXPECTED ==================================================================== Test                      Result    Flag   Units      Ref Range   Creatinine              35               mg/dL      >=20 ==================================================================== Declared Medications:  The flagging and interpretation on this report are based on the  following declared medications.  Unexpected results may arise from  inaccuracies in the declared medications.  **Note: The testing scope of this panel includes these medications:  Gabapentin (Neurontin)  Hydrocodone (Hydrocodone-Acetaminophen)  Hydroxyzine  Lamotrigine  Methocarbamol  Oxycodone (Oxycodone Acetaminophen)  Promethazine  Quetiapine  Tramadol   **Note: The testing scope of this panel does not include small to  moderate amounts of these reported medications:  Acetaminophen  Acetaminophen (Hydrocodone-Acetaminophen)  Acetaminophen (Oxycodone Acetaminophen)  Aspirin (Aspirin 81)  Ibuprofen  **Note: The testing scope of this panel does not include following  reported medications:  Albuterol  Atorvastatin  Azelastine  Bumetanide  Buspirone  Fluticasone (Trelegy Ellipta)  Glipizide  Levocetirizine  Levothyroxine  Metformin  Mirabegron  Montelukast  Nicotine  Omega-3 Fatty Acids  Ondansetron  Potassium  Ranitidine  Tiotropium  Umeclidinium (Trelegy Ellipta)  Vilanterol (Trelegy Ellipta) ==================================================================== For clinical consultation, please call [phone]. ====================================================================    UDS interpretation: Unexpected findings not considered significantly abnormal          Medication Assessment Form: Reviewed. Patient indicates being compliant with therapy Treatment compliance: Compliant Risk Assessment Profile: Aberrant behavior: See prior evaluations. None observed or detected today Comorbid factors increasing risk of overdose: age 80-44 years old, Benzodiazepine use, bipolar disorder, caucasian and history of alcoholism Risk of substance use disorder (SUD): Low  ORT Scoring interpretation table:  Score <3 = Low Risk for SUD  Score between 4-7 = Moderate Risk for SUD  Score >8 = High Risk for Opioid Abuse   Risk Mitigation Strategies:  Patient Counseling: Covered Patient-Prescriber Agreement (PPA): Present and active  Notification to other healthcare providers: Done  Pharmacologic Plan: No change in therapy, at this time.             Laboratory Chemistry  Inflammation Markers (CRP: Acute Phase) (ESR: Chronic Phase) Lab Results  Component Value Date   CRP 21 (H) 09/09/2017   ESRSEDRATE 50 (H) 09/09/2017    LATICACIDVEN 1.9 04/15/2017                         Rheumatology Markers Lab Results  Component Value Date   RF <10.0 09/09/2017   ANA Negative 09/09/2017                        Renal Function Markers Lab Results  Component Value Date   BUN 12 09/27/2017   CREATININE 0.66 09/27/2017   BCR 16 09/09/2017   GFRAA >60 09/27/2017   GFRNONAA >60 09/27/2017                             Hepatic Function Markers Lab Results  Component Value Date   AST 16 09/09/2017   ALT 14 08/10/2017   ALBUMIN 4.6 09/09/2017   ALKPHOS 126 (H) 09/09/2017   LIPASE 44 08/10/2017                        Electrolytes Lab Results  Component Value Date   NA 140 09/27/2017   K 4.3 09/27/2017   CL 107 09/27/2017   CALCIUM 9.2 09/27/2017   MG 1.9 09/09/2017                        Neuropathy Markers Lab Results  Component Value Date   PYKDXIPJ82 505 09/09/2017   HIV Non Reactive 09/18/2016                        CNS Tests No results found for: COLORCSF, APPEARCSF, RBCCOUNTCSF, WBCCSF, POLYSCSF, LYMPHSCSF, EOSCSF, PROTEINCSF, GLUCCSF, JCVIRUS, CSFOLI, IGGCSF  Bone Pathology Markers Lab Results  Component Value Date   25OHVITD1 43 09/09/2017   25OHVITD2 <1.0 09/09/2017   25OHVITD3 43 09/09/2017                         Coagulation Parameters Lab Results  Component Value Date   INR 0.92 09/20/2016   LABPROT 12.3 09/20/2016   APTT <24 (L) 09/20/2016   PLT 259 09/27/2017                        Cardiovascular Markers Lab Results  Component Value Date   BNP 24.0 07/04/2016   TROPONINI <0.03 09/27/2017   HGB 12.3 09/27/2017   HCT 36.8 09/27/2017                         CA Markers No results found for: CEA, CA125, LABCA2                      Note: Lab results reviewed.  Recent Diagnostic Imaging Results  DG C-Arm 1-60 Min-No Report Fluoroscopy was utilized by the requesting physician.  No radiographic  interpretation.   Complexity Note: Imaging results  reviewed. Results shared with Marie Clarke, using [employer].                         Meds   Current Outpatient Medications:  .  acetaminophen (TYLENOL) 325 MG tablet, Take 1 tablet (325 mg total) by mouth every 6 (six) hours as needed for mild pain (or Fever >/= 101)., Disp: , Rfl:  .  aspirin EC 81 MG tablet, Take 81 mg by mouth daily at 12 noon., Disp: , Rfl:  .  atorvastatin (LIPITOR) 40 MG tablet, Take 40 mg by mouth at bedtime. , Disp: , Rfl:  .  azelastine (ASTELIN) 0.1 % nasal spray, Place 1 spray into both nostrils daily., Disp: , Rfl:  .  busPIRone (BUSPAR) 10 MG tablet, Take 2 tablets (20 mg total) by mouth 3 (three) times daily., Disp: 540 tablet, Rfl: 1 .  diphenoxylate-atropine (LOMOTIL) 2.5-0.025 MG tablet, Take by mouth., Disp: , Rfl:  .  gabapentin (NEURONTIN) 600 MG tablet, Take 2 tablets (1,200 mg total) by mouth 3 (three) times daily., Disp: 180 tablet, Rfl: 5 .  hydrOXYzine (VISTARIL) 25 MG capsule, , Disp: , Rfl:  .  KLOR-CON M20 20 MEQ tablet, Take 20 mEq by mouth daily. , Disp: , Rfl:  .  lamoTRIgine (LAMICTAL) 25 MG tablet, Take 3 tablets (75 mg total) by mouth daily., Disp: 270 tablet, Rfl: 0 .  Lancets (ACCU-CHEK SAFE-T PRO) lancets, , Disp: , Rfl:  .  levocetirizine (XYZAL) 5 MG tablet, Take 5 mg by mouth every evening. , Disp: , Rfl:  .  levothyroxine (SYNTHROID, LEVOTHROID) 200 MCG tablet, Take 200 mcg by mouth daily. , Disp: , Rfl:  .  Magnesium Oxide 500 MG CAPS, Take 1 capsule (500 mg total) by mouth 2 (two) times daily at 8 am and 10 pm., Disp: 180 capsule, Rfl: 1 .  metFORMIN (GLUCOPHAGE) 500 MG tablet, , Disp: , Rfl:  .  methocarbamol (ROBAXIN) 750 MG tablet, Take 1 tablet (750 mg total) by mouth every 8 (eight) hours as needed for muscle spasms., Disp: 90 tablet, Rfl: 5 .  montelukast (SINGULAIR) 10 MG tablet, Take 10 mg by mouth daily. , Disp: , Rfl:  .  MYRBETRIQ 50 MG TB24 tablet, , Disp: , Rfl:  .  NICOTROL 10 MG inhaler, , Disp: , Rfl:  .  omega-3  fish oil (MAXEPA) 1000 MG CAPS capsule, , Disp: , Rfl:  .  ondansetron (ZOFRAN-ODT) 8 MG disintegrating tablet, Take 8 mg by mouth as needed., Disp: , Rfl:  .  promethazine (PHENERGAN) 25 MG tablet, Take 1 tablet (25 mg total) by mouth every 6 (six) hours as needed for nausea or vomiting., Disp: 20 tablet, Rfl: 0 .  QUEtiapine (SEROQUEL) 25 MG tablet, TAKE 3 TABLETS (75 MG TOTAL) BY MOUTH ATBEDTIME, Disp: 270 tablet, Rfl: 0 .  rizatriptan (MAXALT) 10 MG tablet, Take 10 mg by mouth as needed., Disp: , Rfl:  .  traMADol (ULTRAM) 50 MG tablet, Take 1 tablet (50 mg total) by mouth every 6 (six) hours as needed for severe pain., Disp: 90 tablet, Rfl: 2 .  TRELEGY ELLIPTA 100-62.5-25 MCG/INH AEPB, , Disp: , Rfl:  .  bumetanide (BUMEX) 2 MG tablet, Take 1 tablet (2 mg total) by mouth 2 (two) times daily., Disp: 60 tablet, Rfl: 11 .  levothyroxine (SYNTHROID, LEVOTHROID) 25 MCG tablet, Take 25 mcg by mouth daily before breakfast. , Disp: , Rfl:   ROS  Constitutional: Denies any fever or chills Gastrointestinal: No reported hemesis, hematochezia, vomiting, or acute GI distress Musculoskeletal: Denies any acute onset joint swelling, redness, loss of ROM, or weakness Neurological: No reported episodes of acute onset apraxia, aphasia, dysarthria, agnosia, amnesia, paralysis, loss of coordination, or loss of consciousness  Allergies  Marie Clarke is allergic to diazepam; levofloxacin; metronidazole; pregabalin; sulfa antibiotics; cephalexin; ciprofloxacin; ziprasidone hcl; and penicillins.  PFSH  Drug: Marie Clarke  reports that she does not use drugs. Alcohol:  reports that she does not drink alcohol. Tobacco:  reports that she has been smoking cigarettes. She has been smoking about 1.00 pack per day. She has never used smokeless tobacco. Medical:  has a past medical history of Anemia, Anxiety, Asthma, Bipolar disorder ([place]), CAD (coronary artery disease) ([place]), CHF (congestive heart failure) ([place]), COPD (chronic  obstructive pulmonary disease) ([place]), Depression ([place]), Diabetes mellitus without complication ([place]), Diabetes mellitus, type II ([place]), Drug overdose, GERD (gastroesophageal reflux disease), Headache, Hyperlipidemia, Hypertension, Left leg pain (04/29/2014), MI (myocardial infarction) ([place]), Muscle ache (09/16/2014), Osteoporosis, Overactive bladder, Pancreatitis ([place]), Reflex sympathetic dystrophy, Renal insufficiency, Sleep apnea, Sleep apnea, Stroke ([place]), Thyroid disease, TIA (transient ischemic attack) ([place]), and TIA (transient ischemic attack). Surgical: Marie Clarke  has a past surgical history that includes prolapse rectum surgery (N/A, July 2016); Abdominal hysterectomy; Tonsillectomy; and Hernia repair (07/15/2017). Family: family history includes Anxiety disorder in her father and mother; Bipolar disorder in her father and mother; CAD in her mother; Depression in her father and mother; Diabetes Mellitus II in her mother; Hypertension in her father; Osteoarthritis in her mother; Osteoporosis in her mother; Post-traumatic stress disorder in her sister; Sleep apnea in her mother.  Constitutional Exam  General appearance: Well nourished, well developed, and well hydrated. In no apparent acute distress Vitals:   01/22/18 0900  BP: 104/77  Pulse: (!) 111  Resp: 16  Temp: 98.7 F (37.1 C)  TempSrc: Oral  SpO2: 94%  Weight: 215 lb (97.5 kg)  Height: '5\' 4"'  (1.626 m)   BMI Assessment: Estimated body mass index is 36.9 kg/m as calculated from the following:   Height as of this encounter: '5\' 4"'  (1.626 m).   Weight as of this encounter: 215 lb (97.5 kg).  BMI interpretation  table: BMI level Category Range association with higher incidence of chronic pain  <18 kg/m2 Underweight   18.5-24.9 kg/m2 Ideal body weight   25-29.9 kg/m2 Overweight Increased incidence by 20%  30-34.9 kg/m2 Obese (Class I) Increased incidence by 68%  35-39.9 kg/m2 Severe obesity (Class II) Increased incidence by 136%  >40  kg/m2 Extreme obesity (Class III) Increased incidence by 254%   Patient's current BMI Ideal Body weight  Body mass index is 36.9 kg/m. Ideal body weight: 54.7 kg (120 lb 9.5 oz) Adjusted ideal body weight: 71.8 kg (158 lb 5.7 oz)   BMI Readings from Last 4 Encounters:  01/22/18 36.90 kg/m  12/23/17 36.05 kg/m  12/05/17 36.05 kg/m  11/13/17 37.59 kg/m   Wt Readings from Last 4 Encounters:  01/22/18 215 lb (97.5 kg)  12/23/17 210 lb (95.3 kg)  12/05/17 210 lb (95.3 kg)  11/13/17 219 lb (99.3 kg)  Psych/Mental status: Alert, oriented x 3 (person, place, & time)       Eyes: PERLA Respiratory: No evidence of acute respiratory distress  Lumbar Spine Area Exam  Skin & Axial Inspection: No masses, redness, or swelling Alignment: Symmetrical Functional ROM: Unrestricted ROM       Stability: No instability detected Muscle Tone/Strength: Functionally intact. No obvious neuro-muscular anomalies detected. Sensory (Neurological): Unimpaired Palpation: Tender         Gait & Posture Assessment  Ambulation: Unassisted Gait: Relatively normal for age and body habitus Posture: WNL   Lower Extremity Exam    Side: Right lower extremity  Side: Left lower extremity  Stability: No instability observed          Stability: No instability observed          Skin & Extremity Inspection: Skin color, temperature, and hair growth are WNL. No peripheral edema or cyanosis. No masses, redness, swelling, asymmetry, or associated skin lesions. No contractures.  Skin & Extremity Inspection: Skin color, temperature, and hair growth are WNL. No peripheral edema or cyanosis. No masses, redness, swelling, asymmetry, or associated skin lesions. No contractures.  Functional ROM: Unrestricted ROM                  Functional ROM: Unrestricted ROM                  Muscle Tone/Strength: Functionally intact. No obvious neuro-muscular anomalies detected.  Muscle Tone/Strength: Functionally intact. No obvious  neuro-muscular anomalies detected.  Sensory (Neurological): Unimpaired        Sensory (Neurological): Unimpaired            Palpation: No palpable anomalies  Palpation: No palpable anomalies   Assessment  Primary Diagnosis & Pertinent Problem List: The primary encounter diagnosis was Spondylosis without myelopathy or radiculopathy, lumbosacral region. Diagnoses of CRPS (complex regional pain syndrome) type 1 of lower limb (left ankle), Chronic musculoskeletal pain, Gastroesophageal reflux disease without esophagitis, and Chronic pain syndrome were also pertinent to this visit.  Status Diagnosis  Persistent Controlled Controlled 1. Spondylosis without myelopathy or radiculopathy, lumbosacral region   2. CRPS (complex regional pain syndrome) type 1 of lower limb (left ankle)   3. Chronic musculoskeletal pain   4. Gastroesophageal reflux disease without esophagitis   5. Chronic pain syndrome     Problems updated and reviewed during this visit: No problems updated. Plan of Care  Pharmacotherapy (Medications Ordered): No orders of the defined types were placed in this encounter.  New Prescriptions   No medications on file   Medications administered today: Stanton Kidney  Jamil had no medications administered during this visit. Lab-work, procedure(s), and/or referral(s): No orders of the defined types were placed in this encounter.  Imaging and/or referral(s): None  Interventional management options: Planned, scheduled, and/or pending:   Therapeutic bilateral lumbar facet RFA #1 under fluoroscopic guidance and IV sedation, starting with the left side.   Considering:   Diagnosticbilateral lumbar facet nerve block Possible bilateral lumbar facetRFA Diagnostic left-sided L5-S1 transforaminal ESI Diagnostic left-sided L5 selective nerve root block Possible left-sided L5 DRG RFA Diagnostic left-sidedlumbarsympathetic nerve block Possible left-sided lumbar sympathetic  RFA Diagnostic bilateral cervical facet nerve block Possible bilateral cervical facetRFA Diagnosticleft intra-articular shoulder injection Diagnosticleft suprascapular nerve block Diagnostic left suprascapularRFA Possible lumbar spinal cord stimulator trial   Palliative PRN treatment(s):   Palliative bilateral lumbar facet nerve block    Provider-requested follow-up: No follow-ups on file.  Future Appointments  Date Time Provider [place]  01/30/2018  1:00 PM Milinda Pointer, MD [facility] None  02/18/2018  8:45 AM Ursula Alert, MD [facility] None   Primary Care Physician: Sharyne Peach, MD Location: [facility] Out[facility] Management Facility Note by: Vevelyn Francois NP Date: 01/22/2018; Time: 9:23 AM  Pain Score Disclaimer: We use the NRS-11 scale. This is a self-reported, subjective measurement of pain severity with only modest accuracy. It is used primarily to identify changes within a particular patient. It must be understood that outpatient pain scales are significantly less accurate that those used for research, where they can be applied under ideal controlled circumstances with minimal exposure to variables. In reality, the score is likely to be a combination of pain intensity and pain affect, where pain affect describes the degree of emotional arousal or changes in action readiness caused by the sensory experience of pain. Factors such as social and work situation, setting, emotional state, anxiety levels, expectation, and prior pain experience may influence pain perception and show large inter-individual differences that may also be affected by time variables.  Patient instructions provided during this appointment: Patient Instructions  ____________________________________________________________________________________________  Medication Rules  Purpose: To inform patients, and their family members, of our rules and regulations.  Applies to: All  patients receiving prescriptions (written or electronic).  Pharmacy of record: Pharmacy where electronic prescriptions will be sent. If written prescriptions are taken to a different pharmacy, please inform the nursing staff. The pharmacy listed in the electronic medical record should be the one where you would like electronic prescriptions to be sent.  Electronic prescriptions: In compliance with the [place] (STOP) Act of 2017 (Session Lanny Cramp [phone]), effective February 12, 2018, all controlled substances must be electronically prescribed. Calling prescriptions to the pharmacy will cease to exist.  Prescription refills: Only during scheduled appointments. Applies to all prescriptions.  NOTE: The following applies primarily to controlled substances (Opioid* Pain Medications).   Patient's responsibilities: 1. Pain Pills: Bring all pain pills to every appointment (except for procedure appointments). 2. Pill Bottles: Bring pills in original pharmacy bottle. Always bring the newest bottle. Bring bottle, even if empty. 3. Medication refills: You are responsible for knowing and keeping track of what medications you take and those you need refilled. The day before your appointment: write a list of all prescriptions that need to be refilled. The day of the appointment: give the list to the admitting nurse. Prescriptions will be written only during appointments. If you forget a medication: it will not be "Called in", "Faxed", or "electronically sent". You will need to get another appointment to get  these prescribed. No early refills. Do not call asking to have your prescription filled early. 4. Prescription Accuracy: You are responsible for carefully inspecting your prescriptions before leaving our office. Have the discharge nurse carefully go over each prescription with you, before taking them home. Make sure that your name is accurately spelled, that your  address is correct. Check the name and dose of your medication to make sure it is accurate. Check the number of pills, and the written instructions to make sure they are clear and accurate. Make sure that you are given enough medication to last until your next medication refill appointment. 5. Taking Medication: Take medication as prescribed. When it comes to controlled substances, taking less pills or less frequently than prescribed is permitted and encouraged. Never take more pills than instructed. Never take medication more frequently than prescribed.  6. Inform other Doctors: Always inform, all of your healthcare providers, of all the medications you take. 7. Pain Medication from other Providers: You are not allowed to accept any additional pain medication from any other Doctor or Healthcare provider. There are two exceptions to this rule. (see below) In the event that you require additional pain medication, you are responsible for notifying us, as stated below. 8. Medication Agreement: You are responsible for carefully reading and following our Medication Agreement. This must be signed before receiving any prescriptions from our practice. Safely store a copy of your signed Agreement. Violations to the Agreement will result in no further prescriptions. (Additional copies of our Medication Agreement are available upon request.) 9. Laws, Rules, & Regulations: All patients are expected to follow all Federal and [employer], Rules, [employer]. Ignorance of the Laws does not constitute a valid excuse. The use of any illegal substances is prohibited. 10. Adopted CDC guidelines & recommendations: Target dosing levels will be at or below 60 MME/day. Use of benzodiazepines** is not recommended.  Exceptions: There are only two exceptions to the rule of not receiving pain medications from other Healthcare Providers. 1. Exception #1 (Emergencies): In the event of an emergency (i.e.: accident requiring  emergency care), you are allowed to receive additional pain medication. However, you are responsible for: As soon as you are able, call our office [phone], at any time of the day or night, and leave a message stating your name, the date and nature of the emergency, and the name and dose of the medication prescribed. In the event that your call is answered by a member of our staff, make sure to document and save the date, time, and the name of the person that took your information.  2. Exception #2 (Planned Surgery): In the event that you are scheduled by another doctor or dentist to have any type of surgery or procedure, you are allowed (for a period no longer than 30 days), to receive additional pain medication, for the acute post-op pain. However, in this case, you are responsible for picking up a copy of our "Post-op Pain Management for Surgeons" handout, and giving it to your surgeon or dentist. This document is available at our office, and does not require an appointment to obtain it. Simply go to our office during business hours (Monday-Thursday from 8:00 AM to 4:00 PM) (Friday 8:00 AM to 12:00 Noon) or if you have a scheduled appointment with [place], prior to your surgery, and ask for it by name. In addition, you will need to provide [place] with your name, name of your surgeon, type of surgery, and date of  procedure or surgery.  *Opioid medications include: morphine, codeine, oxycodone, oxymorphone, hydrocodone, hydromorphone, meperidine, tramadol, tapentadol, buprenorphine, fentanyl, methadone. **Benzodiazepine medications include: diazepam (Valium), alprazolam (Xanax), clonazepam (Klonopine), lorazepam (Ativan), clorazepate (Tranxene), chlordiazepoxide (Librium), estazolam (Prosom), oxazepam (Serax), temazepam (Restoril), triazolam (Halcion) (Last updated: 04/11/2017) ____________________________________________________________________________________________

## 2018-01-22 NOTE — Patient Instructions (Signed)
____________________________________________________________________________________________  Medication Rules  Purpose: To inform patients, and their family members, of our rules and regulations.  Applies to: All patients receiving prescriptions (written or electronic).  Pharmacy of record: Pharmacy where electronic prescriptions will be sent. If written prescriptions are taken to a different pharmacy, please inform the nursing staff. The pharmacy listed in the electronic medical record should be the one where you would like electronic prescriptions to be sent.  Electronic prescriptions: In compliance with the [place] Strengthen Opioid Misuse Prevention (STOP) Act of 2017 (Session Law 2017-74/H243), effective February 12, 2018, all controlled substances must be electronically prescribed. Calling prescriptions to the pharmacy will cease to exist.  Prescription refills: Only during scheduled appointments. Applies to all prescriptions.  NOTE: The following applies primarily to controlled substances (Opioid* Pain Medications).   Patient's responsibilities: 1. Pain Pills: Bring all pain pills to every appointment (except for procedure appointments). 2. Pill Bottles: Bring pills in original pharmacy bottle. Always bring the newest bottle. Bring bottle, even if empty. 3. Medication refills: You are responsible for knowing and keeping track of what medications you take and those you need refilled. The day before your appointment: write a list of all prescriptions that need to be refilled. The day of the appointment: give the list to the admitting nurse. Prescriptions will be written only during appointments. If you forget a medication: it will not be "Called in", "Faxed", or "electronically sent". You will need to get another appointment to get these prescribed. No early refills. Do not call asking to have your prescription filled early. 4. Prescription Accuracy: You are responsible for  carefully inspecting your prescriptions before leaving our office. Have the discharge nurse carefully go over each prescription with you, before taking them home. Make sure that your name is accurately spelled, that your address is correct. Check the name and dose of your medication to make sure it is accurate. Check the number of pills, and the written instructions to make sure they are clear and accurate. Make sure that you are given enough medication to last until your next medication refill appointment. 5. Taking Medication: Take medication as prescribed. When it comes to controlled substances, taking less pills or less frequently than prescribed is permitted and encouraged. Never take more pills than instructed. Never take medication more frequently than prescribed.  6. Inform other Doctors: Always inform, all of your healthcare providers, of all the medications you take. 7. Pain Medication from other Providers: You are not allowed to accept any additional pain medication from any other Doctor or Healthcare provider. There are two exceptions to this rule. (see below) In the event that you require additional pain medication, you are responsible for notifying us, as stated below. 8. Medication Agreement: You are responsible for carefully reading and following our Medication Agreement. This must be signed before receiving any prescriptions from our practice. Safely store a copy of your signed Agreement. Violations to the Agreement will result in no further prescriptions. (Additional copies of our Medication Agreement are available upon request.) 9. Laws, Rules, & Regulations: All patients are expected to follow all Federal and State Laws, Statutes, Rules, & Regulations. Ignorance of the Laws does not constitute a valid excuse. The use of any illegal substances is prohibited. 10. Adopted CDC guidelines & recommendations: Target dosing levels will be at or below 60 MME/day. Use of benzodiazepines** is not  recommended.  Exceptions: There are only two exceptions to the rule of not receiving pain medications from other Healthcare Providers. 1.   Exception #1 (Emergencies): In the event of an emergency (i.e.: accident requiring emergency care), you are allowed to receive additional pain medication. However, you are responsible for: As soon as you are able, call our office [phone], at any time of the day or night, and leave a message stating your name, the date and nature of the emergency, and the name and dose of the medication prescribed. In the event that your call is answered by a member of our staff, make sure to document and save the date, time, and the name of the person that took your information.  2. Exception #2 (Planned Surgery): In the event that you are scheduled by another doctor or dentist to have any type of surgery or procedure, you are allowed (for a period no longer than 30 days), to receive additional pain medication, for the acute post-op pain. However, in this case, you are responsible for picking up a copy of our "Post-op Pain Management for Surgeons" handout, and giving it to your surgeon or dentist. This document is available at our office, and does not require an appointment to obtain it. Simply go to our office during business hours (Monday-Thursday from 8:00 AM to 4:00 PM) (Friday 8:00 AM to 12:00 Noon) or if you have a scheduled appointment with us, prior to your surgery, and ask for it by name. In addition, you will need to provide us with your name, name of your surgeon, type of surgery, and date of procedure or surgery.  *Opioid medications include: morphine, codeine, oxycodone, oxymorphone, hydrocodone, hydromorphone, meperidine, tramadol, tapentadol, buprenorphine, fentanyl, methadone. **Benzodiazepine medications include: diazepam (Valium), alprazolam (Xanax), clonazepam (Klonopine), lorazepam (Ativan), clorazepate (Tranxene), chlordiazepoxide (Librium), estazolam (Prosom),  oxazepam (Serax), temazepam (Restoril), triazolam (Halcion) (Last updated: 04/11/2017) ____________________________________________________________________________________________    

## 2018-01-22 NOTE — Progress Notes (Signed)
Nursing Pain Medication Assessment:  Safety precautions to be maintained throughout the outpatient stay will include: orient to surroundings, keep bed in low position, maintain call bell within reach at all times, provide assistance with transfer out of bed and ambulation.  Medication Inspection Compliance: Pill count conducted under aseptic conditions, in front of the patient. Neither the pills nor the bottle was removed from the patient's sight at any time. Once count was completed pills were immediately returned to the patient in their original bottle.  Medication: Tramadol (Ultram) Pill/Patch Count: 53 of 90 pills remain Pill/Patch Appearance: Markings consistent with prescribed medication Bottle Appearance: Standard pharmacy container. Clearly labeled. Filled Date: 50 / 11 / 2019 Last Medication intake:  Today

## 2018-01-23 DIAGNOSIS — I2510 Atherosclerotic heart disease of native coronary artery without angina pectoris: Secondary | ICD-10-CM | POA: Diagnosis not present

## 2018-01-24 DIAGNOSIS — I2510 Atherosclerotic heart disease of native coronary artery without angina pectoris: Secondary | ICD-10-CM | POA: Diagnosis not present

## 2018-01-25 DIAGNOSIS — I2510 Atherosclerotic heart disease of native coronary artery without angina pectoris: Secondary | ICD-10-CM | POA: Diagnosis not present

## 2018-01-26 ENCOUNTER — Emergency Department
Admission: EM | Admit: 2018-01-26 | Discharge: 2018-01-26 | Disposition: A | Discharge status: Home / Self Care | Payer: Medicare HMO | Attending: Student in an Organized Health Care Education/Training Program | Admitting: Student in an Organized Health Care Education/Training Program

## 2018-01-26 ENCOUNTER — Encounter: Payer: Self-pay | Admitting: Emergency Medicine

## 2018-01-26 ENCOUNTER — Other Ambulatory Visit: Payer: Self-pay

## 2018-01-26 ENCOUNTER — Emergency Department: Payer: Medicare HMO

## 2018-01-26 DIAGNOSIS — Z79899 Other long term (current) drug therapy: Secondary | ICD-10-CM | POA: Diagnosis not present

## 2018-01-26 DIAGNOSIS — E119 Type 2 diabetes mellitus without complications: Secondary | ICD-10-CM | POA: Diagnosis not present

## 2018-01-26 DIAGNOSIS — J189 Pneumonia, unspecified organism: Secondary | ICD-10-CM | POA: Insufficient documentation

## 2018-01-26 DIAGNOSIS — F17210 Nicotine dependence, cigarettes, uncomplicated: Secondary | ICD-10-CM | POA: Diagnosis not present

## 2018-01-26 DIAGNOSIS — I10 Essential (primary) hypertension: Secondary | ICD-10-CM | POA: Insufficient documentation

## 2018-01-26 DIAGNOSIS — I2510 Atherosclerotic heart disease of native coronary artery without angina pectoris: Secondary | ICD-10-CM | POA: Diagnosis not present

## 2018-01-26 DIAGNOSIS — R05 Cough: Secondary | ICD-10-CM | POA: Diagnosis not present

## 2018-01-26 DIAGNOSIS — R079 Chest pain, unspecified: Secondary | ICD-10-CM | POA: Diagnosis not present

## 2018-01-26 MED ORDER — IPRATROPIUM-ALBUTEROL 0.5-2.5 (3) MG/3ML IN SOLN
3.0000 mL | Freq: Once | RESPIRATORY_TRACT | Status: AC
  Administered 2018-01-26: 3 mL via RESPIRATORY_TRACT
  Filled 2018-01-26: qty 3

## 2018-01-26 MED ORDER — AZITHROMYCIN 250 MG PO TABS: 250.0000 mg | ORAL_TABLET | Freq: Every day | ORAL | 0 refills | Status: AC

## 2018-01-26 MED ORDER — AZITHROMYCIN 500 MG PO TABS
500.0000 mg | ORAL_TABLET | Freq: Once | ORAL | Status: AC
  Administered 2018-01-26: 500 mg via ORAL
  Filled 2018-01-26: qty 1

## 2018-01-26 MED ORDER — AZITHROMYCIN 250 MG PO TABS: 250.0000 mg | ORAL_TABLET | Freq: Every day | ORAL | 0 refills | Status: DC

## 2018-01-26 NOTE — ED Triage Notes (Signed)
Pt to ED via POV c/o cough, congestion, and chills x 3 days. Pt is afebrile in triage, in NAD.

## 2018-01-26 NOTE — ED Provider Notes (Addendum)
[facility] Emergency Department Provider Note ____________________________________________  Time seen: 1054  I have reviewed the triage vital signs and the nursing notes.  HISTORY  Chief Complaint  Cough  HPI Marie Clarke is a 47 y.o. female presents to the ED with complaints of cough, congestion, and chills for the last 3 days.  Patient describes cough as well as no chest pain or shortness of breath and a sore throat that began 1 day prior.  She is completed a course of clindamycin about 2 weeks prior for an upper respiratory infection.  She reports resolution of her symptoms after completing a course of medications, until about 3 days prior.  She denies any known sick contacts, recent travel, or other exposures.  Past Medical History:  Diagnosis Date  . Anemia   . Anxiety   . Asthma   . Bipolar disorder ([place])   . CAD (coronary artery disease) unk  . CHF (congestive heart failure) ([place])   . COPD (chronic obstructive pulmonary disease) ([place])   . Depression unk  . Diabetes mellitus without complication ([place])   . Diabetes mellitus, type II ([place])   . Drug overdose   . GERD (gastroesophageal reflux disease)   . Headache   . Hyperlipidemia   . Hypertension   . Left leg pain 04/29/2014  . MI (myocardial infarction) ([place])   . Muscle ache 09/16/2014  . Osteoporosis   . Overactive bladder   . Pancreatitis unk  . Reflex sympathetic dystrophy   . Renal insufficiency   . Sleep apnea    pt reported on 2/6/7 she currently is not using CPAP  . Sleep apnea   . Stroke ([place])   . Thyroid disease   . TIA (transient ischemic attack) unk  . TIA (transient ischemic attack)     Patient Active Problem List   Diagnosis Date Noted  . Disease related peripheral neuropathy 11/13/2017  . Gastroesophageal reflux disease without esophagitis 11/13/2017  . Occipital headache (Bilateral) 11/13/2017  . Cervicogenic headache (Bilateral) (L>R) 11/13/2017  . History of postoperative  nausea 10/22/2017  . Chronic shoulder pain (Fifth Area of Pain) (Bilateral) (L>R) 10/09/2017  . CRPS (complex regional pain syndrome) type 1 of lower limb (left ankle) 10/09/2017  . Ankle joint instability (Left) 10/09/2017  . Ankle sprain, sequela (Left) 10/09/2017  . History of psychiatric symptoms 10/09/2017  . Chronic pain syndrome 10/02/2017  . Spondylosis without myelopathy or radiculopathy, lumbosacral region 10/02/2017  . Chronic musculoskeletal pain 10/02/2017  . Elevated C-reactive protein (CRP) 09/10/2017  . Elevated sed rate 09/10/2017  . Chronic neck pain (Fourth Area of Pain) (Bilateral) (L>R) 09/09/2017  . Pharmacologic therapy 09/09/2017  . Disorder of skeletal system 09/09/2017  . Problems influencing health status 09/09/2017  . Long term current use of opiate analgesic 09/09/2017  . Tobacco use disorder 07/16/2017  . Ventral hernia without obstruction or gangrene 06/25/2017  . Strain of extensor muscle, fascia and tendon of left index finger at wrist and hand level, initial encounter 05/16/2017  . Sepsis ([place]) 04/14/2017  . Osteopenia 04/03/2017  . Hypotension 09/17/2016  . Contusion of knee (Left) 10/12/2015  . Strain of knee (Left) 10/12/2015  . Incidental lung nodule 04/28/2015  . Chronic pain 03/28/2015  . Chronic low back pain (Primary Area of Pain) (Bilateral) (L>R) 03/28/2015  . Chronic lower extremity pain (Referred) (Secondary Area of Pain) (Left) 03/28/2015  . Abdominal wound dehiscence 03/28/2015  . Encounter for pain management planning 03/28/2015  . Morbid obesity ([place]) 03/28/2015  .  Abnormal CT scan, lumbar spine 03/28/2015  . Lumbar facet hypertrophy 03/28/2015  . Lumbar facet syndrome (Bilateral) (L>R) 03/28/2015  . Lumbar foraminal stenosis (Bilateral) (L5-S1) 03/28/2015  . Chronic ankle pain [facility] Area of Pain) (Left) 03/28/2015  . Neurogenic pain 03/28/2015  . Neuropathic pain 03/28/2015  . Myofascial pain 03/28/2015  . History of  suicide attempt 03/28/2015  . 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 Abnormal gait 12/15/2014  . Congestive heart failure ([place]) 11/15/2014  . Abdominal wall abscess 09/20/2014  . 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 Diabetes mellitus, type 2 ([place]) 08/13/2014  . Bipolar affective disorder ([place]) 08/13/2014  . Type 2 diabetes mellitus ([place]) 08/13/2014  . Rectal prolapse 08/09/2014  . Rectal bleeding 08/09/2014  . Rectal bleed 08/09/2014  . Affective bipolar disorder ([place]) 08/05/2014  . Arteriosclerosis of coronary artery 08/05/2014  . CCF (congestive cardiac failure) ([place]) 08/05/2014  . Chronic kidney disease 08/05/2014  . 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 Apnea, sleep 08/05/2014  . Temporary cerebral vascular dysfunction 08/05/2014  . Polypharmacy 04/29/2014  . Other long term (current) drug therapy 04/29/2014  . 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 Chronic kidney disease, stage III (moderate) ([place]) 12/14/2013  . Controlled diabetes mellitus type II without complication ([place]) 64/33/2951  . Essential (primary) hypertension 12/03/2013  . Adult hypothyroidism 12/03/2013  . Controlled type 2 diabetes mellitus without complication ([place]) 88/41/6606    Past Surgical History:  Procedure Laterality Date  . ABDOMINAL HYSTERECTOMY    . HERNIA REPAIR  07/15/2017  . prolapse rectum surgery N/A July 2016  . TONSILLECTOMY      Prior to Admission medications   Medication Sig Start Date End Date Taking? Authorizing Provider  acetaminophen (TYLENOL) 325 MG tablet Take 1 tablet (325 mg total) by mouth every 6 (six) hours as needed for mild pain (or Fever >/= 101). 09/18/16   Gouru, Aruna, MD  aspirin EC 81 MG tablet Take 81 mg by mouth daily at 12 noon.    [provider]  atorvastatin (LIPITOR) 40 MG tablet Take 40 mg by mouth at bedtime.     [provider]  azelastine (ASTELIN) 0.1 % nasal spray Place 1 spray into both nostrils daily.    [provider]   azithromycin (ZITHROMAX Z-PAK) 250 MG tablet Take 1 tablet (250 mg total) by mouth daily for 4 days. 01/26/18 01/30/18  Hendry Speas, Dannielle Karvonen, PA-C  bumetanide (BUMEX) 2 MG tablet Take 1 tablet (2 mg total) by mouth 2 (two) times daily. 09/20/16 12/05/17  Nicholes Mango, MD  busPIRone (BUSPAR) 10 MG tablet Take 2 tablets (20 mg total) by mouth 3 (three) times daily. 01/01/18   Ursula Alert, MD  diphenoxylate-atropine (LOMOTIL) 2.5-0.025 MG tablet Take by mouth. 12/25/17 11/29/18  [provider]  gabapentin (NEURONTIN) 600 MG tablet Take 2 tablets (1,200 mg total) by mouth 3 (three) times daily. 11/13/17 05/12/18  Milinda Pointer, MD  hydrOXYzine (VISTARIL) 25 MG capsule  12/16/17   [provider]  KLOR-CON M20 20 MEQ tablet Take 20 mEq by mouth daily.  10/13/15   [provider]  lamoTRIgine (LAMICTAL) 25 MG tablet Take 3 tablets (75 mg total) by mouth daily. 01/01/18   Ursula Alert, MD  Lancets (ACCU-CHEK SAFE-T PRO) lancets  09/30/17   [provider]  levocetirizine (XYZAL) 5 MG tablet Take 5 mg by mouth every evening.  08/05/17   [provider]  levothyroxine (SYNTHROID, LEVOTHROID) 200 MCG tablet Take 200 mcg by mouth daily.  [provider]  levothyroxine (SYNTHROID, LEVOTHROID) 25 MCG tablet Take 25 mcg by mouth daily before breakfast.  06/13/16 12/23/17  [provider]  Magnesium Oxide 500 MG CAPS Take 1 capsule (500 mg total) by mouth 2 (two) times daily at 8 am and 10 pm. 01/22/18 07/21/18  Vevelyn Francois, NP  metFORMIN (GLUCOPHAGE) 500 MG tablet  08/26/17   [provider]  methocarbamol (ROBAXIN) 750 MG tablet Take 1 tablet (750 mg total) by mouth every 8 (eight) hours as needed for muscle spasms. 01/22/18 07/21/18  Vevelyn Francois, NP  montelukast (SINGULAIR) 10 MG tablet Take 10 mg by mouth daily.     [provider]  MYRBETRIQ 50 MG TB24 tablet  12/19/17   [provider]  NICOTROL 10 MG  inhaler  11/11/17   [provider]  omega-3 fish oil (MAXEPA) 1000 MG CAPS capsule  08/15/17   [provider]  ondansetron (ZOFRAN-ODT) 8 MG disintegrating tablet Take 8 mg by mouth as needed. 01/21/18   [provider]  promethazine (PHENERGAN) 25 MG tablet Take 1 tablet (25 mg total) by mouth every 6 (six) hours as needed for nausea or vomiting. 08/01/17   Earleen Newport, MD  QUEtiapine (SEROQUEL) 25 MG tablet TAKE 3 TABLETS (75 MG TOTAL) BY MOUTH ATBEDTIME 01/01/18   Ursula Alert, MD  rizatriptan (MAXALT) 10 MG tablet Take 10 mg by mouth as needed. 01/03/18   [provider]  traMADol (ULTRAM) 50 MG tablet Take 1 tablet (50 mg total) by mouth every 6 (six) hours as needed for severe pain. 02/04/18 05/05/18  Vevelyn Francois, NP  Viviana Simpler ELLIPTA 100-62.5-25 MCG/INH AEPB  08/19/17   [provider]    Allergies Diazepam; Levofloxacin; Metronidazole; Pregabalin; Sulfa antibiotics; Cephalexin; Ciprofloxacin; Ziprasidone hcl; and Penicillins  Family History  Problem Relation Age of Onset  . Diabetes Mellitus II Mother   . CAD Mother   . Sleep apnea Mother   . Osteoarthritis Mother   . Osteoporosis Mother   . Anxiety disorder Mother   . Depression Mother   . Bipolar disorder Mother   . Bipolar disorder Father   . Hypertension Father   . Depression Father   . Anxiety disorder Father   . Post-traumatic stress disorder Sister     Social History Social History   Tobacco Use  . Smoking status: Current Every Day Smoker    Packs/day: 1.00    Types: Cigarettes    Last attempt to quit: 06/12/2016    Years since quitting: 1.6  . Smokeless tobacco: Never Used  Substance Use Topics  . Alcohol use: No    Alcohol/week: 0.0 standard drinks  . Drug use: No    Review of Systems  Constitutional: Negative for fever.  She reports chills as above. Eyes: Negative for visual changes. ENT: Negative for sore throat.  Reports cough and congestion as  above. Cardiovascular: Negative for chest pain. Respiratory: Negative for shortness of breath. Gastrointestinal: Negative for abdominal pain, vomiting and diarrhea. Genitourinary: Negative for dysuria. Musculoskeletal: Negative for back pain. Skin: Negative for rash. Neurological: Negative for headaches, focal weakness or numbness. ____________________________________________  PHYSICAL EXAM:  VITAL SIGNS: ED Triage Vitals  Enc Vitals Group     BP 01/26/18 0945 110/67     Pulse Rate 01/26/18 0945 (!) 110     Resp 01/26/18 0945 18     Temp 01/26/18 0945 97.9 F (36.6 C)     Temp Source 01/26/18 0945 Oral  SpO2 01/26/18 0945 95 %     Weight --      Height --      Head Circumference --      Peak Flow --      Pain Score 01/26/18 0951 9     Pain Loc --      Pain Edu? --      Excl. in [place]? --     Constitutional: Alert and oriented. Well appearing and in no distress. Head: Normocephalic and atraumatic. Eyes: Conjunctivae are normal. PERRL. Normal extraocular movements Ears: Canals clear. TMs intact bilaterally. Nose: No congestion/rhinorrhea/epistaxis. Mouth/Throat: Mucous membranes are moist.  Uvula is midline and tonsils are flat. Neck: Supple. No thyromegaly. Hematological/Lymphatic/Immunological: No cervical lymphadenopathy. Cardiovascular: Normal rate, regular rhythm. Normal distal pulses. Respiratory: Normal respiratory effort. No wheezes/rales/rhonchi. Gastrointestinal: Soft and nontender. No distention. Skin:  Skin is warm, dry and intact. No rash noted. ____________________________________________   RADIOLOGY  CXR IMPRESSION: Moderate bronchitic changes with increase in perihilar markings since previous exam favoring perihilar infiltrates question infection; consider atypical etiologies. ____________________________________________  PROCEDURES  Procedures Azithromycin 500 mg PO DuoNeb x 1 ____________________________________________  INITIAL IMPRESSION  / ASSESSMENT AND PLAN / ED COURSE  Patient with ED evaluation of a 3-day complaint of cough, congestion, and chills.  Patient's clinical picture concerning for a lower respiratory etiology including viral etiology.  Her chest x-ray is concerning as it may represent an early infiltrate.  As such patient will be treated empirically with a azithromycin for community-acquired pneumonia.  Patient will follow-up with her primary provider or return to the ED as needed.  ----------------------------------------- 1:44 PM on 01/28/2018 -----------------------------------------  Patient called to report allergic reaction to azithromycin. She states itchy, raw, skin to the vulvar and perineum. She also noted some generalized itching after the initial ED dose. I will call in Doxycycline BID x 10 days and Diflucan #2 for yeast dermatitis. She will also increase her hydroxyzine to TID.  Pharmacy: [facility] [phone] ____________________________________________  FINAL CLINICAL IMPRESSION(S) / ED DIAGNOSES  Final diagnoses:  Community acquired pneumonia, unspecified laterality      Carmie End, Dannielle Karvonen, PA-C 01/26/18 1912    Melvenia Needles, PA-C 01/28/18 1349    Merlyn Lot, MD 02/01/18 2308

## 2018-01-26 NOTE — ED Notes (Signed)
See triage note  Presents with cough,congestion for about 3 days  Unsure of fever but has had chills  Afebrile on arrival

## 2018-01-26 NOTE — ED Triage Notes (Signed)
First Nurse Note:  C/O painful cough, chills, sore throat x 1 day.  Recently treated with clindamycin for URI.  Symptoms resolved.  AAOx3.  Skin warm and dry. NAD. No SOB/ DOE.  Strong cough observed.

## 2018-01-26 NOTE — Discharge Instructions (Signed)
Your x-ray was concerning for an early community-acquired pneumonia.  Will be treated empirically with an antibiotic.  Take medication as prescribed.  Continue usual home medications for cough and COPD.  Follow-up with your primary provider or return to the ED for worsening symptoms.

## 2018-01-27 ENCOUNTER — Telehealth: Payer: Self-pay | Admitting: Pain Medicine

## 2018-01-27 DIAGNOSIS — I2510 Atherosclerotic heart disease of native coronary artery without angina pectoris: Secondary | ICD-10-CM | POA: Diagnosis not present

## 2018-01-27 NOTE — Telephone Encounter (Signed)
Pt is scheduled for a procedure with Dr. Delane Ginger on 12/19 and said that she is on abx for pneumonia and wanted to know if she could still have the procedure or if she should reschedule?

## 2018-01-27 NOTE — Telephone Encounter (Signed)
Patient was dx on yesterday with pneumonia and was started on antibiotics.  We will reschedule her appt. Will send to [facility] for approval.

## 2018-01-28 DIAGNOSIS — I2510 Atherosclerotic heart disease of native coronary artery without angina pectoris: Secondary | ICD-10-CM | POA: Diagnosis not present

## 2018-01-28 LAB — TOXASSURE SELECT 13 (MW), URINE

## 2018-01-29 DIAGNOSIS — G4733 Obstructive sleep apnea (adult) (pediatric): Secondary | ICD-10-CM | POA: Diagnosis not present

## 2018-01-29 DIAGNOSIS — I2510 Atherosclerotic heart disease of native coronary artery without angina pectoris: Secondary | ICD-10-CM | POA: Diagnosis not present

## 2018-01-30 ENCOUNTER — Ambulatory Visit: Payer: Medicare HMO | Admitting: Pain Medicine

## 2018-01-30 DIAGNOSIS — I2510 Atherosclerotic heart disease of native coronary artery without angina pectoris: Secondary | ICD-10-CM | POA: Diagnosis not present

## 2018-01-31 DIAGNOSIS — J189 Pneumonia, unspecified organism: Secondary | ICD-10-CM | POA: Diagnosis not present

## 2018-01-31 DIAGNOSIS — E782 Mixed hyperlipidemia: Secondary | ICD-10-CM | POA: Diagnosis not present

## 2018-01-31 DIAGNOSIS — R000 Tachycardia, unspecified: Secondary | ICD-10-CM | POA: Diagnosis not present

## 2018-01-31 DIAGNOSIS — E1121 Type 2 diabetes mellitus with diabetic nephropathy: Secondary | ICD-10-CM | POA: Diagnosis not present

## 2018-01-31 DIAGNOSIS — I2510 Atherosclerotic heart disease of native coronary artery without angina pectoris: Secondary | ICD-10-CM | POA: Diagnosis not present

## 2018-02-01 DIAGNOSIS — I2510 Atherosclerotic heart disease of native coronary artery without angina pectoris: Secondary | ICD-10-CM | POA: Diagnosis not present

## 2018-02-02 DIAGNOSIS — I2510 Atherosclerotic heart disease of native coronary artery without angina pectoris: Secondary | ICD-10-CM | POA: Diagnosis not present

## 2018-02-03 DIAGNOSIS — I2510 Atherosclerotic heart disease of native coronary artery without angina pectoris: Secondary | ICD-10-CM | POA: Diagnosis not present

## 2018-02-04 ENCOUNTER — Emergency Department: Payer: Medicare HMO

## 2018-02-04 ENCOUNTER — Encounter: Payer: Self-pay | Admitting: *Deleted

## 2018-02-04 ENCOUNTER — Emergency Department
Admission: EM | Admit: 2018-02-04 | Discharge: 2018-02-04 | Disposition: A | Discharge status: Home / Self Care | Payer: Medicare HMO | Attending: Emergency Medicine | Admitting: Emergency Medicine

## 2018-02-04 ENCOUNTER — Encounter: Payer: Self-pay | Admitting: Pain Medicine

## 2018-02-04 ENCOUNTER — Other Ambulatory Visit: Payer: Self-pay

## 2018-02-04 DIAGNOSIS — R000 Tachycardia, unspecified: Secondary | ICD-10-CM | POA: Diagnosis not present

## 2018-02-04 DIAGNOSIS — I2510 Atherosclerotic heart disease of native coronary artery without angina pectoris: Secondary | ICD-10-CM | POA: Insufficient documentation

## 2018-02-04 DIAGNOSIS — Z7982 Long term (current) use of aspirin: Secondary | ICD-10-CM | POA: Insufficient documentation

## 2018-02-04 DIAGNOSIS — I130 Hypertensive heart and chronic kidney disease with heart failure and stage 1 through stage 4 chronic kidney disease, or unspecified chronic kidney disease: Secondary | ICD-10-CM | POA: Insufficient documentation

## 2018-02-04 DIAGNOSIS — F17210 Nicotine dependence, cigarettes, uncomplicated: Secondary | ICD-10-CM | POA: Insufficient documentation

## 2018-02-04 DIAGNOSIS — F319 Bipolar disorder, unspecified: Secondary | ICD-10-CM | POA: Diagnosis not present

## 2018-02-04 DIAGNOSIS — R0789 Other chest pain: Secondary | ICD-10-CM | POA: Diagnosis not present

## 2018-02-04 DIAGNOSIS — J45909 Unspecified asthma, uncomplicated: Secondary | ICD-10-CM | POA: Insufficient documentation

## 2018-02-04 DIAGNOSIS — Z79899 Other long term (current) drug therapy: Secondary | ICD-10-CM | POA: Insufficient documentation

## 2018-02-04 DIAGNOSIS — I252 Old myocardial infarction: Secondary | ICD-10-CM | POA: Insufficient documentation

## 2018-02-04 DIAGNOSIS — R52 Pain, unspecified: Secondary | ICD-10-CM | POA: Diagnosis not present

## 2018-02-04 DIAGNOSIS — N183 Chronic kidney disease, stage 3 (moderate): Secondary | ICD-10-CM | POA: Diagnosis not present

## 2018-02-04 DIAGNOSIS — Z7984 Long term (current) use of oral hypoglycemic drugs: Secondary | ICD-10-CM | POA: Diagnosis not present

## 2018-02-04 DIAGNOSIS — J449 Chronic obstructive pulmonary disease, unspecified: Secondary | ICD-10-CM | POA: Diagnosis not present

## 2018-02-04 DIAGNOSIS — M25521 Pain in right elbow: Secondary | ICD-10-CM | POA: Diagnosis not present

## 2018-02-04 DIAGNOSIS — R0902 Hypoxemia: Secondary | ICD-10-CM | POA: Diagnosis not present

## 2018-02-04 DIAGNOSIS — F419 Anxiety disorder, unspecified: Secondary | ICD-10-CM | POA: Diagnosis not present

## 2018-02-04 DIAGNOSIS — I509 Heart failure, unspecified: Secondary | ICD-10-CM | POA: Diagnosis not present

## 2018-02-04 DIAGNOSIS — M25531 Pain in right wrist: Secondary | ICD-10-CM | POA: Diagnosis not present

## 2018-02-04 DIAGNOSIS — S299XXA Unspecified injury of thorax, initial encounter: Secondary | ICD-10-CM | POA: Diagnosis not present

## 2018-02-04 DIAGNOSIS — M7918 Myalgia, other site: Secondary | ICD-10-CM

## 2018-02-04 DIAGNOSIS — S59901A Unspecified injury of right elbow, initial encounter: Secondary | ICD-10-CM | POA: Diagnosis not present

## 2018-02-04 DIAGNOSIS — T300 Burn of unspecified body region, unspecified degree: Secondary | ICD-10-CM | POA: Diagnosis not present

## 2018-02-04 DIAGNOSIS — Z8673 Personal history of transient ischemic attack (TIA), and cerebral infarction without residual deficits: Secondary | ICD-10-CM | POA: Insufficient documentation

## 2018-02-04 DIAGNOSIS — M79643 Pain in unspecified hand: Secondary | ICD-10-CM | POA: Diagnosis not present

## 2018-02-04 DIAGNOSIS — E1122 Type 2 diabetes mellitus with diabetic chronic kidney disease: Secondary | ICD-10-CM | POA: Insufficient documentation

## 2018-02-04 DIAGNOSIS — S6991XA Unspecified injury of right wrist, hand and finger(s), initial encounter: Secondary | ICD-10-CM | POA: Diagnosis not present

## 2018-02-04 DIAGNOSIS — M79601 Pain in right arm: Secondary | ICD-10-CM | POA: Diagnosis not present
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
## 2018-02-04 NOTE — ED Provider Notes (Signed)
-----------------------------------------   9:40 PM on 02/04/2018 ----------------------------------------- EKG interpreted by me Sinus tachycardia rate 123, normal axis intervals QRS ST segments and T waves.  No EKG evidence of right heart strain.   Carrie Mew, MD 02/04/18 [id number]
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
## 2018-02-05 DIAGNOSIS — I2510 Atherosclerotic heart disease of native coronary artery without angina pectoris: Secondary | ICD-10-CM | POA: Diagnosis not present

## 2018-02-06 ENCOUNTER — Encounter: Payer: Self-pay | Admitting: Pain Medicine

## 2018-02-06 DIAGNOSIS — I2510 Atherosclerotic heart disease of native coronary artery without angina pectoris: Secondary | ICD-10-CM | POA: Diagnosis not present

## 2018-02-07 ENCOUNTER — Encounter: Payer: Self-pay | Admitting: Pain Medicine

## 2018-02-07 DIAGNOSIS — I2510 Atherosclerotic heart disease of native coronary artery without angina pectoris: Secondary | ICD-10-CM | POA: Diagnosis not present

## 2018-02-08 DIAGNOSIS — I2510 Atherosclerotic heart disease of native coronary artery without angina pectoris: Secondary | ICD-10-CM | POA: Diagnosis not present

## 2018-02-09 DIAGNOSIS — I2510 Atherosclerotic heart disease of native coronary artery without angina pectoris: Secondary | ICD-10-CM | POA: Diagnosis not present

## 2018-02-10 ENCOUNTER — Other Ambulatory Visit
Admission: RE | Admit: 2018-02-10 | Discharge: 2018-02-10 | Disposition: A | Discharge status: Home / Self Care | Payer: Medicare HMO | Source: Ambulatory Visit | Attending: Specialist | Admitting: Specialist

## 2018-02-10 DIAGNOSIS — J439 Emphysema, unspecified: Secondary | ICD-10-CM | POA: Diagnosis not present

## 2018-02-10 DIAGNOSIS — R0609 Other forms of dyspnea: Secondary | ICD-10-CM | POA: Insufficient documentation

## 2018-02-10 DIAGNOSIS — J811 Chronic pulmonary edema: Secondary | ICD-10-CM | POA: Diagnosis not present

## 2018-02-10 DIAGNOSIS — G4733 Obstructive sleep apnea (adult) (pediatric): Secondary | ICD-10-CM | POA: Diagnosis not present

## 2018-02-10 DIAGNOSIS — Z6841 Body Mass Index (BMI) 40.0 and over, adult: Secondary | ICD-10-CM | POA: Diagnosis not present

## 2018-02-10 DIAGNOSIS — J189 Pneumonia, unspecified organism: Secondary | ICD-10-CM | POA: Diagnosis not present

## 2018-02-10 DIAGNOSIS — I2510 Atherosclerotic heart disease of native coronary artery without angina pectoris: Secondary | ICD-10-CM | POA: Diagnosis not present

## 2018-02-10 LAB — BRAIN NATRIURETIC PEPTIDE: B Natriuretic Peptide: 23 pg/mL (ref 0.0–100.0)

## 2018-02-11 DIAGNOSIS — I2510 Atherosclerotic heart disease of native coronary artery without angina pectoris: Secondary | ICD-10-CM | POA: Diagnosis not present

## 2018-02-12 DIAGNOSIS — G4733 Obstructive sleep apnea (adult) (pediatric): Secondary | ICD-10-CM | POA: Diagnosis not present

## 2018-02-12 DIAGNOSIS — I2510 Atherosclerotic heart disease of native coronary artery without angina pectoris: Secondary | ICD-10-CM | POA: Diagnosis not present

## 2018-02-13 DIAGNOSIS — I2510 Atherosclerotic heart disease of native coronary artery without angina pectoris: Secondary | ICD-10-CM | POA: Diagnosis not present

## 2018-02-14 ENCOUNTER — Encounter: Payer: Self-pay | Admitting: Pain Medicine

## 2018-02-14 DIAGNOSIS — I2510 Atherosclerotic heart disease of native coronary artery without angina pectoris: Secondary | ICD-10-CM | POA: Diagnosis not present

## 2018-02-15 DIAGNOSIS — I2510 Atherosclerotic heart disease of native coronary artery without angina pectoris: Secondary | ICD-10-CM | POA: Diagnosis not present

## 2018-02-16 DIAGNOSIS — I2510 Atherosclerotic heart disease of native coronary artery without angina pectoris: Secondary | ICD-10-CM | POA: Diagnosis not present

## 2018-02-17 DIAGNOSIS — I2510 Atherosclerotic heart disease of native coronary artery without angina pectoris: Secondary | ICD-10-CM | POA: Diagnosis not present

## 2018-02-18 ENCOUNTER — Ambulatory Visit (INDEPENDENT_AMBULATORY_CARE_PROVIDER_SITE_OTHER): Payer: Medicare HMO | Admitting: Psychiatry

## 2018-02-18 ENCOUNTER — Encounter: Payer: Self-pay | Admitting: Psychiatry

## 2018-02-18 ENCOUNTER — Other Ambulatory Visit: Payer: Self-pay

## 2018-02-18 ENCOUNTER — Ambulatory Visit: Payer: Medicare HMO | Admitting: Pain Medicine

## 2018-02-18 VITALS — BP 137/79 | HR 121 | Temp 99.1°F | Wt 221.8 lb

## 2018-02-18 DIAGNOSIS — F4310 Post-traumatic stress disorder, unspecified: Secondary | ICD-10-CM

## 2018-02-18 DIAGNOSIS — F17200 Nicotine dependence, unspecified, uncomplicated: Secondary | ICD-10-CM | POA: Diagnosis not present

## 2018-02-18 DIAGNOSIS — R000 Tachycardia, unspecified: Secondary | ICD-10-CM

## 2018-02-18 DIAGNOSIS — I2510 Atherosclerotic heart disease of native coronary artery without angina pectoris: Secondary | ICD-10-CM | POA: Diagnosis not present

## 2018-02-18 DIAGNOSIS — F3160 Bipolar disorder, current episode mixed, unspecified: Secondary | ICD-10-CM

## 2018-02-18 MED ORDER — QUETIAPINE FUMARATE 50 MG PO TABS: 75.0000 mg | ORAL_TABLET | Freq: Every day | ORAL | 0 refills | Status: DC

## 2018-02-18 MED ORDER — VARENICLINE TARTRATE 0.5 MG PO TABS: 0.5000 mg | ORAL_TABLET | Freq: Two times a day (BID) | ORAL | 3 refills | Status: DC

## 2018-02-18 MED ORDER — LAMOTRIGINE 25 MG PO TABS: 75.0000 mg | ORAL_TABLET | Freq: Every day | ORAL | 0 refills | Status: DC

## 2018-02-18 NOTE — Progress Notes (Signed)
[place] MD OP Progress Note  02/18/2018 9:24 AM Marie Clarke  MRN:  [id number]  Chief Complaint: ' I am here for follow up.' Chief Complaint    Follow-up; Medication Refill     HPI: Marie Clarke is a 48 year old Caucasian female, single, on SSD, lives in [place], has a history of bipolar disorder, COPD, OSA on CPAP, coronary artery disease, hyperlipidemia, hypertension, hypothyroidism, back pain, recent abdominal surgery, presented to the clinic today for a follow-up visit.  Patient today reports she is currently doing well on the current medication regimen.  She denies any significant anxiety symptoms.  She denies any significant depressive symptoms.  Patient reports she is compliant on her medications as prescribed.  She denies any side effects.  She reports she has started taking Chantix again.  She reports she has been able to cut down on her smoking.  She reports she had a good holiday with her family.  She continues to have some stressors of having an Marie Clarke who has dementia in her house.  She helps take care of her Marie Clarke.  Reports it can be stressful however she has been coping better.  Patient with elevated heart rate today.  She does have elevated thyroid hormone from recent labs dated 01/31/2018.  Her Synthroid was readjusted by her primary medical doctor.  Patient also has a history of cardiac problems.  She does have a cardiologist.  Will refer her back to her cardiologist for further management. Visit Diagnosis:    ICD-10-CM   1. Bipolar I disorder, most recent episode mixed (HCC) F31.60 QUEtiapine (SEROQUEL) 50 MG tablet    lamoTRIgine (LAMICTAL) 25 MG tablet  2. PTSD (post-traumatic stress disorder) F43.10 QUEtiapine (SEROQUEL) 50 MG tablet    lamoTRIgine (LAMICTAL) 25 MG tablet  3. Tobacco use disorder F17.200 varenicline (CHANTIX) 0.5 MG tablet  4. Tachycardia, unspecified R00.0     Past Psychiatric History: Reviewed past psychiatric history from my progress note on  03/06/2017.  Past Medical History:  Past Medical History:  Diagnosis Date  . Anemia   . Anxiety   . Asthma   . Bipolar disorder ([place])   . CAD (coronary artery disease) unk  . CHF (congestive heart failure) ([place])   . COPD (chronic obstructive pulmonary disease) ([place])   . Depression unk  . Diabetes mellitus without complication ([place])   . Diabetes mellitus, type II ([place])   . Drug overdose   . GERD (gastroesophageal reflux disease)   . Headache   . Hyperlipidemia   . Hypertension   . Left leg pain 04/29/2014  . MI (myocardial infarction) ([place])   . Muscle ache 09/16/2014  . Osteoporosis   . Overactive bladder   . Pancreatitis unk  . Reflex sympathetic dystrophy   . Renal insufficiency   . Sleep apnea    pt reported on 2/6/7 she currently is not using CPAP  . Sleep apnea   . Stroke ([place])   . Thyroid disease   . TIA (transient ischemic attack) unk  . TIA (transient ischemic attack)     Past Surgical History:  Procedure Laterality Date  . ABDOMINAL HYSTERECTOMY    . HERNIA REPAIR  07/15/2017  . prolapse rectum surgery N/A July 2016  . TONSILLECTOMY      Family Psychiatric History: I have reviewed family psychiatric history from my progress note on 03/06/2017.  Family History:  Family History  Problem Relation Age of Onset  . Diabetes Mellitus II Mother   . CAD Mother   . Sleep  apnea Mother   . Osteoarthritis Mother   . Osteoporosis Mother   . Anxiety disorder Mother   . Depression Mother   . Bipolar disorder Mother   . Bipolar disorder Father   . Hypertension Father   . Depression Father   . Anxiety disorder Father   . Post-traumatic stress disorder Sister     Social History: Reviewed social history from my progress note on 03/06/2017. Social History   Socioeconomic History  . Marital status: Single    Spouse name: Not on file  . Number of children: 0  . Years of education: Not on file  . Highest education level: High school graduate  Occupational History     Comment: not employed  Social Needs  . Financial resource strain: Not hard at all  . Food insecurity:    Worry: Never true    Inability: Never true  . Transportation needs:    Medical: Yes    Non-medical: Yes  Tobacco Use  . Smoking status: Current Every Day Smoker    Packs/day: 1.00    Types: Cigarettes    Last attempt to quit: 06/12/2016    Years since quitting: 1.6  . Smokeless tobacco: Never Used  Substance and Sexual Activity  . Alcohol use: No    Alcohol/week: 0.0 standard drinks  . Drug use: No  . Sexual activity: Not Currently  Lifestyle  . Physical activity:    Days per week: 0 days    Minutes per session: 0 min  . Stress: Not at all  Relationships  . Social connections:    Talks on phone: More than three times a week    Gets together: More than three times a week    Attends religious service: More than 4 times per year    Active member of club or organization: No    Attends meetings of clubs or organizations: Never    Relationship status: Never married  Other Topics Concern  . Not on file  Social History Narrative  . Not on file    Allergies:  Allergies  Allergen Reactions  . Diazepam Hives and Nausea And Vomiting  . Levofloxacin Hives and Rash    1/31: would like to retry since not taking valium  . Metronidazole Hives    1/31: would like to retry since she is no longer taking valium  . Pregabalin Hives and Rash    1/31 currently taking lyrica without reaction since no longer taking valium  . Sulfa Antibiotics Hives  . Cephalexin Hives  . Ciprofloxacin Hives  . Ziprasidone Hcl Other (See Comments)    CONFUSED CONFUSED Other reaction(s): Other (See Comments) CONFUSED CONFUSED   . Penicillins Nausea And Vomiting    Has patient had a PCN reaction causing immediate rash, facial/tongue/throat swelling, SOB or lightheadedness with hypotension: No Has patient had a PCN reaction causing severe rash involving mucus membranes or skin necrosis: No Has  patient had a PCN reaction that required hospitalization: No Has patient had a PCN reaction occurring within the last 10 years: No If all of the above answers are "NO", then may proceed with Cephalosporin use.     Metabolic Disorder Labs: No results found for: HGBA1C, MPG No results found for: PROLACTIN No results found for: CHOL, TRIG, HDL, CHOLHDL, VLDL, LDLCALC No results found for: TSH  Therapeutic Level Labs: No results found for: LITHIUM No results found for: VALPROATE No components found for:  CBMZ  Current Medications: Current Outpatient Medications  Medication Sig Dispense  Refill  . acetaminophen (TYLENOL) 325 MG tablet Take 1 tablet (325 mg total) by mouth every 6 (six) hours as needed for mild pain (or Fever >/= 101).    Marland Kitchen aspirin EC 81 MG tablet Take 81 mg by mouth daily at 12 noon.    Marland Kitchen atorvastatin (LIPITOR) 40 MG tablet Take 40 mg by mouth at bedtime.     Marland Kitchen azelastine (ASTELIN) 0.1 % nasal spray Place 1 spray into both nostrils daily.    . busPIRone (BUSPAR) 10 MG tablet Take 2 tablets (20 mg total) by mouth 3 (three) times daily. 540 tablet 1  . diphenoxylate-atropine (LOMOTIL) 2.5-0.025 MG tablet Take by mouth.    . gabapentin (NEURONTIN) 600 MG tablet Take 2 tablets (1,200 mg total) by mouth 3 (three) times daily. 180 tablet 5  . hydrOXYzine (VISTARIL) 25 MG capsule     . KLOR-CON M20 20 MEQ tablet Take 20 mEq by mouth daily.     Marland Kitchen lamoTRIgine (LAMICTAL) 25 MG tablet Take 3 tablets (75 mg total) by mouth daily. 270 tablet 0  . Lancets (ACCU-CHEK SAFE-T PRO) lancets     . levothyroxine (SYNTHROID, LEVOTHROID) 200 MCG tablet Take 200 mcg by mouth daily.     . Magnesium Oxide 500 MG CAPS Take 1 capsule (500 mg total) by mouth 2 (two) times daily at 8 am and 10 pm. 180 capsule 1  . metFORMIN (GLUCOPHAGE) 500 MG tablet     . methocarbamol (ROBAXIN) 750 MG tablet Take 1 tablet (750 mg total) by mouth every 8 (eight) hours as needed for muscle spasms. 90 tablet 5  .  montelukast (SINGULAIR) 10 MG tablet Take 10 mg by mouth daily.     Marland Kitchen MYRBETRIQ 50 MG TB24 tablet     . NICOTROL 10 MG inhaler     . omega-3 fish oil (MAXEPA) 1000 MG CAPS capsule     . ondansetron (ZOFRAN-ODT) 8 MG disintegrating tablet Take 8 mg by mouth as needed.    . promethazine (PHENERGAN) 25 MG tablet Take 1 tablet (25 mg total) by mouth every 6 (six) hours as needed for nausea or vomiting. 20 tablet 0  . rizatriptan (MAXALT) 10 MG tablet Take 10 mg by mouth as needed.    . traMADol (ULTRAM) 50 MG tablet Take 1 tablet (50 mg total) by mouth every 6 (six) hours as needed for severe pain. 120 tablet 2  . TRELEGY ELLIPTA 100-62.5-25 MCG/INH AEPB     . bumetanide (BUMEX) 2 MG tablet Take 1 tablet (2 mg total) by mouth 2 (two) times daily. 60 tablet 11  . QUEtiapine (SEROQUEL) 50 MG tablet Take 1.5 tablets (75 mg total) by mouth at bedtime. 135 tablet 0  . varenicline (CHANTIX) 0.5 MG tablet Take 1 tablet (0.5 mg total) by mouth 2 (two) times daily. 60 tablet 3   No current facility-administered medications for this visit.      Musculoskeletal: Strength & Muscle Tone: within normal limits Gait & Station: normal Patient leans: N/A  Psychiatric Specialty Exam: Review of Systems  Psychiatric/Behavioral: The patient is not nervous/anxious.   All other systems reviewed and are negative.   Blood pressure 137/79, pulse (!) 121, temperature 99.1 F (37.3 C), temperature source Oral, weight 221 lb 12.8 oz (100.6 kg).Body mass index is 38.07 kg/m.  General Appearance: Casual  Eye Contact:  Fair  Speech:  Normal Rate  Volume:  Normal  Mood:  Euthymic  Affect:  Congruent  Thought Process:  Goal Directed and  Descriptions of Associations: Intact  Orientation:  Full (Time, Place, and Person)  Thought Content: Logical   Suicidal Thoughts:  No  Homicidal Thoughts:  No  Memory:  Immediate;   Fair Recent;   Fair Remote;   Fair  Judgement:  Fair  Insight:  Fair  Psychomotor Activity:   Normal  Concentration:  Concentration: Fair and Attention Span: Fair  Recall:  [employer] of Knowledge: Fair  Language: Fair  Akathisia:  No  Handed:  Right  AIMS (if indicated): denies tremors, rigidity,stiffness  Assets:  Communication Skills Desire for Improvement Social Support  ADL's:  Intact  Cognition: WNL  Sleep:  Fair   Screenings: PHQ2-9     Office Visit from 12/23/2017 in [facility] Procedure visit from 12/05/2017 in [facility] Office Visit from 11/13/2017 in [facility] Procedure visit from 10/22/2017 in [facility] Office Visit from 10/02/2017 in [facility]  PHQ-2 Total Score  0  0  0  0  0       Assessment and Plan: Amyri is a 48 year old Caucasian female who has a history of bipolar disorder, multiple medical problems, presented to the clinic today for a follow-up visit.  Patient is currently making progress on the current medication regimen.  Patient with tachycardia today, she does have recent abnormal thyroid panel which is currently being managed by her PMD.  Also discussed referring her back to her cardiologist.  Plan as noted below.  Plan For bipolar disorder-improving Lamictal 75 mg p.o. daily BuSpar 20 mg p.o. 3 times daily Seroquel 75 mg p.o. daily  For tobacco use disorder Provided smoking cessation counseling. Will provide Chantix 0.5 mg p.o. twice daily.  For elevated heart rate Likely due to abnormal thyroid hormone-her Synthroid was recently reduced to 200 mcg. 01/31/2018 labs reviewed- TSH-low at 0.04 and T4-high at 1.22.  She will continue to follow PMD for the same. We will refer her back to her cardiologist Dr. Jerrye Beavers.  Patient to call to make an appointment.  Follow-up in clinic in 2 to 3 months or sooner if needed.  I have  spent atleast 15 minutes  face to face with patient today. More than 50 % of the time was spent for psychoeducation and supportive psychotherapy and care coordination.  I have spent atleast ----- face to face with patient today. More than 50 % of the time was spent for psychoeducation and supportive psychotherapy and care coordination.  This note was generated in part or whole with voice recognition software. Voice recognition is usually quite accurate but there are transcription errors that can and very often do occur. I apologize for any typographical errors that were not detected and corrected.        Ursula Alert, MD 02/18/2018, 9:24 AM

## 2018-02-19 DIAGNOSIS — I2510 Atherosclerotic heart disease of native coronary artery without angina pectoris: Secondary | ICD-10-CM | POA: Diagnosis not present

## 2018-02-20 DIAGNOSIS — I2510 Atherosclerotic heart disease of native coronary artery without angina pectoris: Secondary | ICD-10-CM | POA: Diagnosis not present

## 2018-02-21 DIAGNOSIS — I2510 Atherosclerotic heart disease of native coronary artery without angina pectoris: Secondary | ICD-10-CM | POA: Diagnosis not present

## 2018-02-22 ENCOUNTER — Encounter: Payer: Self-pay | Admitting: Pain Medicine

## 2018-02-22 DIAGNOSIS — I2510 Atherosclerotic heart disease of native coronary artery without angina pectoris: Secondary | ICD-10-CM | POA: Diagnosis not present

## 2018-02-23 DIAGNOSIS — I2510 Atherosclerotic heart disease of native coronary artery without angina pectoris: Secondary | ICD-10-CM | POA: Diagnosis not present

## 2018-02-24 DIAGNOSIS — I2510 Atherosclerotic heart disease of native coronary artery without angina pectoris: Secondary | ICD-10-CM | POA: Diagnosis not present

## 2018-02-25 DIAGNOSIS — I2510 Atherosclerotic heart disease of native coronary artery without angina pectoris: Secondary | ICD-10-CM | POA: Diagnosis not present

## 2018-02-26 DIAGNOSIS — I2510 Atherosclerotic heart disease of native coronary artery without angina pectoris: Secondary | ICD-10-CM | POA: Diagnosis not present

## 2018-02-27 DIAGNOSIS — I2510 Atherosclerotic heart disease of native coronary artery without angina pectoris: Secondary | ICD-10-CM | POA: Diagnosis not present

## 2018-02-28 DIAGNOSIS — I2510 Atherosclerotic heart disease of native coronary artery without angina pectoris: Secondary | ICD-10-CM | POA: Diagnosis not present

## 2018-03-01 DIAGNOSIS — I2510 Atherosclerotic heart disease of native coronary artery without angina pectoris: Secondary | ICD-10-CM | POA: Diagnosis not present

## 2018-03-02 DIAGNOSIS — I2510 Atherosclerotic heart disease of native coronary artery without angina pectoris: Secondary | ICD-10-CM | POA: Diagnosis not present

## 2018-03-03 ENCOUNTER — Encounter: Payer: Self-pay | Admitting: *Deleted

## 2018-03-05 ENCOUNTER — Encounter: Payer: Self-pay | Admitting: Pain Medicine

## 2018-03-06 ENCOUNTER — Telehealth: Payer: Self-pay | Admitting: Pain Medicine

## 2018-03-06 NOTE — Telephone Encounter (Signed)
Patient called stating she is having extreme pain around hernia repair area from before she started coming here. Wants to know if she can take Tylenol with her Tramadol

## 2018-03-06 NOTE — Telephone Encounter (Signed)
Patient called and instructed to call the doctor who did her hernia repair and follow up to make sure there is nothing else going on. We treat her LBP. Patient states she will call today.

## 2018-03-10 ENCOUNTER — Encounter: Payer: Self-pay | Admitting: Pain Medicine

## 2018-03-10 DIAGNOSIS — I2510 Atherosclerotic heart disease of native coronary artery without angina pectoris: Secondary | ICD-10-CM | POA: Diagnosis not present

## 2018-03-11 DIAGNOSIS — I2510 Atherosclerotic heart disease of native coronary artery without angina pectoris: Secondary | ICD-10-CM | POA: Diagnosis not present

## 2018-03-12 DIAGNOSIS — I2510 Atherosclerotic heart disease of native coronary artery without angina pectoris: Secondary | ICD-10-CM | POA: Diagnosis not present

## 2018-03-13 ENCOUNTER — Other Ambulatory Visit: Payer: Self-pay

## 2018-03-13 ENCOUNTER — Encounter: Payer: Self-pay | Admitting: Pain Medicine

## 2018-03-13 ENCOUNTER — Ambulatory Visit
Admission: RE | Admit: 2018-03-13 | Discharge: 2018-03-13 | Disposition: A | Discharge status: Home / Self Care | Payer: Medicare HMO | Source: Ambulatory Visit | Attending: Pain Medicine | Admitting: Pain Medicine

## 2018-03-13 ENCOUNTER — Ambulatory Visit (HOSPITAL_BASED_OUTPATIENT_CLINIC_OR_DEPARTMENT_OTHER): Payer: Medicare HMO | Admitting: Pain Medicine

## 2018-03-13 VITALS — BP 93/64 | HR 102 | Temp 98.8°F | Resp 19 | Ht 64.0 in | Wt 221.0 lb

## 2018-03-13 DIAGNOSIS — I2510 Atherosclerotic heart disease of native coronary artery without angina pectoris: Secondary | ICD-10-CM | POA: Diagnosis not present

## 2018-03-13 DIAGNOSIS — M545 Low back pain, unspecified: Secondary | ICD-10-CM

## 2018-03-13 DIAGNOSIS — M47817 Spondylosis without myelopathy or radiculopathy, lumbosacral region: Secondary | ICD-10-CM

## 2018-03-13 DIAGNOSIS — G8929 Other chronic pain: Secondary | ICD-10-CM | POA: Diagnosis not present

## 2018-03-13 DIAGNOSIS — M47816 Spondylosis without myelopathy or radiculopathy, lumbar region: Secondary | ICD-10-CM | POA: Diagnosis not present

## 2018-03-13 DIAGNOSIS — G8918 Other acute postprocedural pain: Secondary | ICD-10-CM | POA: Insufficient documentation

## 2018-03-13 MED ORDER — FENTANYL CITRATE (PF) 100 MCG/2ML IJ SOLN
25.0000 ug | INTRAMUSCULAR | Status: DC | PRN
  Administered 2018-03-13: 25 ug via INTRAVENOUS
  Filled 2018-03-13: qty 2

## 2018-03-13 MED ORDER — ROPIVACAINE HCL 2 MG/ML IJ SOLN
9.0000 mL | Freq: Once | INTRAMUSCULAR | Status: AC
  Administered 2018-03-13: 9 mL via PERINEURAL
  Filled 2018-03-13: qty 10

## 2018-03-13 MED ORDER — MIDAZOLAM HCL 5 MG/5ML IJ SOLN
1.0000 mg | INTRAMUSCULAR | Status: DC | PRN
  Administered 2018-03-13: 1 mg via INTRAVENOUS
  Filled 2018-03-13: qty 5

## 2018-03-13 MED ORDER — HYDROCODONE-ACETAMINOPHEN 5-325 MG PO TABS: 1.0000 | ORAL_TABLET | Freq: Four times a day (QID) | ORAL | 0 refills | Status: AC | PRN

## 2018-03-13 MED ORDER — LIDOCAINE HCL 2 % IJ SOLN
20.0000 mL | Freq: Once | INTRAMUSCULAR | Status: AC
  Administered 2018-03-13: 400 mg
  Filled 2018-03-13: qty 40

## 2018-03-13 MED ORDER — LACTATED RINGERS IV SOLN
1000.0000 mL | Freq: Once | INTRAVENOUS | Status: AC
  Administered 2018-03-13: 1000 mL via INTRAVENOUS

## 2018-03-13 MED ORDER — TRIAMCINOLONE ACETONIDE 40 MG/ML IJ SUSP
40.0000 mg | Freq: Once | INTRAMUSCULAR | Status: AC
  Administered 2018-03-13: 40 mg
  Filled 2018-03-13: qty 1

## 2018-03-13 NOTE — Progress Notes (Signed)
Patient's Name: Marie Clarke  MRN: [id number]  Referring Provider: Sharyne Peach, MD  DOB: Mar 15, 1970  PCP: Sharyne Peach, MD  DOS: 03/13/2018  Note by: Gaspar Cola, MD  Service setting: Ambulatory outpatient  Specialty: Interventional Pain Management  Patient type: Established  Location: [facility] (AMB) Pain Management Facility  Visit type: Interventional Procedure   Primary Reason for Visit: Interventional Pain Management Treatment. CC: No chief complaint on file.  Procedure:          Anesthesia, Analgesia, Anxiolysis:  Type: Thermal Lumbar Facet, Medial Branch Radiofrequency Ablation/Neurotomy  #1  Primary Purpose: Therapeutic Region: Posterolateral Lumbosacral Spine Level: L2, L3, L4, L5, & S1 Medial Branch Level(s). These levels will denervate the L3-4, L4-5, and the L5-S1 lumbar facet joints. Laterality: Left  Type: Moderate (Conscious) Sedation combined with Local Anesthesia Indication(s): Analgesia and Anxiety Route: Intravenous (IV) IV Access: Secured Sedation: Meaningful verbal contact was maintained at all times during the procedure  Local Anesthetic: Lidocaine 1-2%  Position: Prone   Indications: 1. Spondylosis without myelopathy or radiculopathy, lumbosacral region   2. Lumbar facet syndrome (Bilateral) (L>R)   3. Lumbar facet hypertrophy   4. Chronic low back pain (Primary Area of Pain) (Bilateral) (L>R)   5. Acute postoperative pain    [name] has been dealing with the above chronic pain for longer than three months and has either failed to respond, was unable to tolerate, or simply did not get enough benefit from other more conservative therapies including, but not limited to: 1. Over-the-counter medications 2. Anti-inflammatory medications 3. Muscle relaxants 4. Membrane stabilizers 5. Opioids 6. Physical therapy and/or chiropractic manipulation 7. Modalities (Heat, ice, etc.) 8. Invasive techniques such as nerve blocks. [name] has attained more than  50% relief of the pain from a series of diagnostic injections conducted in separate occasions.  Pain Score: Pre-procedure: 8 /10 Post-procedure: 0-No pain/10  Pre-op Assessment:  [name] is a 48 y.o. (year old), female patient, seen today for interventional treatment. She  has a past surgical history that includes prolapse rectum surgery (N/A, July 2016); Abdominal hysterectomy; Tonsillectomy; and Hernia repair (07/15/2017). [name] has a current medication list which includes the following prescription(s): acetaminophen, aspirin ec, atorvastatin, azelastine, buspirone, diphenoxylate-atropine, gabapentin, hydroxyzine, klor-con m20, lamotrigine, accu-chek safe-t pro, levothyroxine, magnesium oxide, meloxicam, metformin, methocarbamol, montelukast, myrbetriq, nicotrol, omega-3 fish oil, ondansetron, promethazine, quetiapine, rizatriptan, tramadol, trelegy ellipta, bumetanide, hydrocodone-acetaminophen, hydrocodone-acetaminophen, and varenicline, and the following Facility-Administered Medications: fentanyl and midazolam. Her primarily concern today is the No chief complaint on file.  Initial Vital Signs:  Pulse/HCG Rate: (!) 110ECG Heart Rate: (!) 109 Temp: 98.1 F (36.7 C) Resp: 16 BP: 112/89 SpO2: 97 %  BMI: Estimated body mass index is 37.93 kg/m as calculated from the following:   Height as of this encounter: 5\' 4"  (1.626 m).   Weight as of this encounter: 221 lb (100.2 kg).  Risk Assessment: Allergies: Reviewed. She is allergic to diazepam; levofloxacin; metronidazole; pregabalin; sulfa antibiotics; cephalexin; ciprofloxacin; ziprasidone hcl; and penicillins.  Allergy Precautions: None required Coagulopathies: Reviewed. None identified.  Blood-thinner therapy: None at this time Active Infection(s): Reviewed. None identified. [name] is afebrile  Site Confirmation: [name] was asked to confirm the procedure and laterality before marking the site Procedure checklist:  Completed Consent: Before the procedure and under the influence of no sedative(s), amnesic(s), or anxiolytics, the patient was informed of the treatment options, risks and possible complications. To fulfill our ethical and legal obligations, as recommended by the  American Medical Association's Code of Ethics, I have informed the patient of my clinical impression; the nature and purpose of the treatment or procedure; the risks, benefits, and possible complications of the intervention; the alternatives, including doing nothing; the risk(s) and benefit(s) of the alternative treatment(s) or procedure(s); and the risk(s) and benefit(s) of doing nothing. The patient was provided information about the general risks and possible complications associated with the procedure. These may include, but are not limited to: failure to achieve desired goals, infection, bleeding, organ or nerve damage, allergic reactions, paralysis, and death. In addition, the patient was informed of those risks and complications associated to Spine-related procedures, such as failure to decrease pain; infection (i.e.: Meningitis, epidural or intraspinal abscess); bleeding (i.e.: epidural hematoma, subarachnoid hemorrhage, or any other type of intraspinal or peri-dural bleeding); organ or nerve damage (i.e.: Any type of peripheral nerve, nerve root, or spinal cord injury) with subsequent damage to sensory, motor, and/or autonomic systems, resulting in permanent pain, numbness, and/or weakness of one or several areas of the body; allergic reactions; (i.e.: anaphylactic reaction); and/or death. Furthermore, the patient was informed of those risks and complications associated with the medications. These include, but are not limited to: allergic reactions (i.e.: anaphylactic or anaphylactoid reaction(s)); adrenal axis suppression; blood sugar elevation that in diabetics may result in ketoacidosis or comma; water retention that in patients with history  of congestive heart failure may result in shortness of breath, pulmonary edema, and decompensation with resultant heart failure; weight gain; swelling or edema; medication-induced neural toxicity; particulate matter embolism and blood vessel occlusion with resultant organ, and/or nervous system infarction; and/or aseptic necrosis of one or more joints. Finally, the patient was informed that Medicine is not an exact science; therefore, there is also the possibility of unforeseen or unpredictable risks and/or possible complications that may result in a catastrophic outcome. The patient indicated having understood very clearly. We have given the patient no guarantees and we have made no promises. Enough time was given to the patient to ask questions, all of which were answered to the patient's satisfaction. Ms. Towson has indicated that she wanted to continue with the procedure. Attestation: I, the ordering provider, attest that I have discussed with the patient the benefits, risks, side-effects, alternatives, likelihood of achieving goals, and potential problems during recovery for the procedure that I have provided informed consent. Date  Time: 03/13/2018  9:53 AM  Pre-Procedure Preparation:  Monitoring: As per clinic protocol. Respiration, ETCO2, SpO2, BP, heart rate and rhythm monitor placed and checked for adequate function Safety Precautions: Patient was assessed for positional comfort and pressure points before starting the procedure. Time-out: I initiated and conducted the "Time-out" before starting the procedure, as per protocol. The patient was asked to participate by confirming the accuracy of the "Time Out" information. Verification of the correct person, site, and procedure were performed and confirmed by me, the nursing staff, and the patient. "Time-out" conducted as per Joint Commission's Universal Protocol (UP.01.01.01). Time: 1102  Description of Procedure:          Laterality: Left Levels:   L2, L3, L4, L5, & S1 Medial Branch Level(s), at the L3-4, L4-5, and the L5-S1 lumbar facet joints. Area Prepped: Lumbosacral Prepping solution: ChloraPrep (2% chlorhexidine gluconate and 70% isopropyl alcohol) Safety Precautions: Aspiration looking for blood return was conducted prior to all injections. At no point did we inject any substances, as a needle was being advanced. Before injecting, the patient was told to immediately notify me if she  was experiencing any new onset of "ringing in the ears, or metallic taste in the mouth". No attempts were made at seeking any paresthesias. Safe injection practices and needle disposal techniques used. Medications properly checked for expiration dates. SDV (single dose vial) medications used. After the completion of the procedure, all disposable equipment used was discarded in the proper designated medical waste containers. Local Anesthesia: Protocol guidelines were followed. The patient was positioned over the fluoroscopy table. The area was prepped in the usual manner. The time-out was completed. The target area was identified using fluoroscopy. A 12-in long, straight, sterile hemostat was used with fluoroscopic guidance to locate the targets for each level blocked. Once located, the skin was marked with an approved surgical skin marker. Once all sites were marked, the skin (epidermis, dermis, and hypodermis), as well as deeper tissues (fat, connective tissue and muscle) were infiltrated with a small amount of a short-acting local anesthetic, loaded on a 10cc syringe with a 25G, 1.5-in  Needle. An appropriate amount of time was allowed for local anesthetics to take effect before proceeding to the next step. Local Anesthetic: Lidocaine 2.0% The unused portion of the local anesthetic was discarded in the proper designated containers. Technical explanation of process:  Radiofrequency Ablation (RFA) L2 Medial Branch Nerve RFA: The target area for the L2 medial branch  is at the junction of the postero-lateral aspect of the superior articular process and the superior, posterior, and medial edge of the transverse process of L3. Under fluoroscopic guidance, a Radiofrequency needle was inserted until contact was made with os over the superior postero-lateral aspect of the pedicular shadow (target area). Sensory and motor testing was conducted to properly adjust the position of the needle. Once satisfactory placement of the needle was achieved, the numbing solution was slowly injected after negative aspiration for blood. 2.0 mL of the nerve block solution was injected without difficulty or complication. After waiting for at least 3 minutes, the ablation was performed. Once completed, the needle was removed intact. L3 Medial Branch Nerve RFA: The target area for the L3 medial branch is at the junction of the postero-lateral aspect of the superior articular process and the superior, posterior, and medial edge of the transverse process of L4. Under fluoroscopic guidance, a Radiofrequency needle was inserted until contact was made with os over the superior postero-lateral aspect of the pedicular shadow (target area). Sensory and motor testing was conducted to properly adjust the position of the needle. Once satisfactory placement of the needle was achieved, the numbing solution was slowly injected after negative aspiration for blood. 2.0 mL of the nerve block solution was injected without difficulty or complication. After waiting for at least 3 minutes, the ablation was performed. Once completed, the needle was removed intact. L4 Medial Branch Nerve RFA: The target area for the L4 medial branch is at the junction of the postero-lateral aspect of the superior articular process and the superior, posterior, and medial edge of the transverse process of L5. Under fluoroscopic guidance, a Radiofrequency needle was inserted until contact was made with os over the superior postero-lateral  aspect of the pedicular shadow (target area). Sensory and motor testing was conducted to properly adjust the position of the needle. Once satisfactory placement of the needle was achieved, the numbing solution was slowly injected after negative aspiration for blood. 2.0 mL of the nerve block solution was injected without difficulty or complication. After waiting for at least 3 minutes, the ablation was performed. Once completed, the needle was removed  intact. L5 Medial Branch Nerve RFA: The target area for the L5 medial branch is at the junction of the postero-lateral aspect of the superior articular process of S1 and the superior, posterior, and medial edge of the sacral ala. Under fluoroscopic guidance, a Radiofrequency needle was inserted until contact was made with os over the superior postero-lateral aspect of the pedicular shadow (target area). Sensory and motor testing was conducted to properly adjust the position of the needle. Once satisfactory placement of the needle was achieved, the numbing solution was slowly injected after negative aspiration for blood. 2.0 mL of the nerve block solution was injected without difficulty or complication. After waiting for at least 3 minutes, the ablation was performed. Once completed, the needle was removed intact. S1 Medial Branch Nerve RFA: The target area for the S1 medial branch is located inferior to the junction of the S1 superior articular process and the L5 inferior articular process, posterior, inferior, and lateral to the 6 o'clock position of the L5-S1 facet joint, just superior to the S1 posterior foramen. Under fluoroscopic guidance, the Radiofrequency needle was advanced until contact was made with os over the Target area. Sensory and motor testing was conducted to properly adjust the position of the needle. Once satisfactory placement of the needle was achieved, the numbing solution was slowly injected after negative aspiration for blood. 2.0 mL of the  nerve block solution was injected without difficulty or complication. After waiting for at least 3 minutes, the ablation was performed. Once completed, the needle was removed intact. Radiofrequency lesioning (ablation):  Radiofrequency Generator: NeuroTherm [id number] Sensory Stimulation Parameters: 50 Hz was used to locate & identify the nerve, making sure that the needle was positioned such that there was no sensory stimulation below 0.3 V or above 0.7 V. Motor Stimulation Parameters: 2 Hz was used to evaluate the motor component. Care was taken not to lesion any nerves that demonstrated motor stimulation of the lower extremities at an output of less than 2.5 times that of the sensory threshold, or a maximum of 2.0 V. Lesioning Technique Parameters: Standard Radiofrequency settings. (Not bipolar or pulsed.) Temperature Settings: 80 degrees C Lesioning time: 60 seconds Intra-operative Compliance: Compliant Materials & Medications: Needle(s) (Electrode/Cannula) Type: Teflon-coated, curved tip, Radiofrequency needle(s) Gauge: 22G Length: 10cm Numbing solution: 0.2% PF-Ropivacaine + Triamcinolone (40 mg/mL) diluted to a final concentration of 4 mg of Triamcinolone/mL of Ropivacaine The unused portion of the solution was discarded in the proper designated containers.  Once the entire procedure was completed, the treated area was cleaned, making sure to leave some of the prepping solution back to take advantage of its long term bactericidal properties.  Illustration of the posterior view of the lumbar spine and the posterior neural structures. Laminae of L2 through S1 are labeled. DPRL5, dorsal primary ramus of L5; DPRS1, dorsal primary ramus of S1; DPR3, dorsal primary ramus of L3; FJ, facet (zygapophyseal) joint L3-L4; I, inferior articular process of L4; LB1, lateral branch of dorsal primary ramus of L1; IAB, inferior articular branches from L3 medial branch (supplies L4-L5 facet joint); IBP,  intermediate branch plexus; MB3, medial branch of dorsal primary ramus of L3; NR3, third lumbar nerve root; S, superior articular process of L5; SAB, superior articular branches from L4 (supplies L4-5 facet joint also); TP3, transverse process of L3.  Vitals:   03/13/18 1133 03/13/18 1137 03/13/18 1147 03/13/18 1158  BP: 101/64 138/80 (!) 86/70 93/64  Pulse: (!) 102     Resp: 14 20 18  19  Temp:  98.6 F (37 C)  98.8 F (37.1 C)  TempSrc:  Temporal  Temporal  SpO2: 98% 98% 95% 96%  Weight:      Height:        Start Time: 1102 hrs. End Time: 1132 hrs.  Imaging Guidance (Spinal):          Type of Imaging Technique: Fluoroscopy Guidance (Spinal) Indication(s): Assistance in needle guidance and placement for procedures requiring needle placement in or near specific anatomical locations not easily accessible without such assistance. Exposure Time: Please see nurses notes. Contrast: None used. Fluoroscopic Guidance: I was personally present during the use of fluoroscopy. "Tunnel Vision Technique" used to obtain the best possible view of the target area. Parallax error corrected before commencing the procedure. "Direction-depth-direction" technique used to introduce the needle under continuous pulsed fluoroscopy. Once target was reached, antero-posterior, oblique, and lateral fluoroscopic projection used confirm needle placement in all planes. Images permanently stored in EMR. Interpretation: No contrast injected. I personally interpreted the imaging intraoperatively. Adequate needle placement confirmed in multiple planes. Permanent images saved into the patient's record.  Antibiotic Prophylaxis:   Anti-infectives (From admission, onward)   None     Indication(s): None identified  Post-operative Assessment:  Post-procedure Vital Signs:  Pulse/HCG Rate: (!) 102(!) 106 Temp: 98.8 F (37.1 C) Resp: 19 BP: 93/64 SpO2: 96 %  EBL: None  Complications: No immediate post-treatment  complications observed by team, or reported by patient.  At the very end of the procedure, and we noticed that the patient was experiencing a nose bleed on the right nostril.  She indicates having a history of this.  At that time, her blood pressure was 95/65.  During the entire procedure she maintained pressures that fluctuate around that level.  Note: The patient tolerated the entire procedure well. A repeat set of vitals were taken after the procedure and the patient was kept under observation following institutional policy, for this type of procedure. Post-procedural neurological assessment was performed, showing return to baseline, prior to discharge. The patient was provided with post-procedure discharge instructions, including a section on how to identify potential problems. Should any problems arise concerning this procedure, the patient was given instructions to immediately contact us, at any time, without hesitation. In any case, we plan to contact the patient by telephone for a follow-up status report regarding this interventional procedure.  Comments:  No additional relevant information.  Plan of Care    Imaging Orders     DG C-Arm 1-60 Min-No Report  Procedure Orders     Radiofrequency,Lumbar     Radiofrequency,Lumbar  Medications ordered for procedure: Meds ordered this encounter  Medications  . lidocaine (XYLOCAINE) 2 % (with pres) injection 400 mg  . midazolam (VERSED) 5 MG/5ML injection 1-2 mg    Make sure Flumazenil is available in the pyxis when using this medication. If oversedation occurs, administer 0.2 mg IV over 15 sec. If after 45 sec no response, administer 0.2 mg again over 1 min; may repeat at 1 min intervals; not to exceed 4 doses (1 mg)  . fentaNYL (SUBLIMAZE) injection 25-50 mcg    Make sure Narcan is available in the pyxis when using this medication. In the event of respiratory depression (RR< 8/min): Titrate NARCAN (naloxone) in increments of 0.1 to 0.2 mg IV  at 2-3 minute intervals, until desired degree of reversal.  . lactated ringers infusion 1,000 mL  . ropivacaine (PF) 2 mg/mL (0.2%) (NAROPIN) injection 9 mL  . triamcinolone acetonide (KENALOG-40)  injection 40 mg  . HYDROcodone-acetaminophen (NORCO/VICODIN) 5-325 MG tablet    Sig: Take 1 tablet by mouth every 6 (six) hours as needed for up to 7 days for severe pain. Must last 7 days.    Dispense:  28 tablet    Refill:  0    For acute post-operative pain. Not to be refilled. Must last 7 days.  Marland Kitchen HYDROcodone-acetaminophen (NORCO/VICODIN) 5-325 MG tablet    Sig: Take 1 tablet by mouth every 6 (six) hours as needed for up to 7 days for severe pain. Must last 7 days.    Dispense:  28 tablet    Refill:  0    For acute post-operative pain. Not to be refilled.  Must last 7 days.   Medications administered: We administered lidocaine, midazolam, fentaNYL, lactated ringers, ropivacaine (PF) 2 mg/mL (0.2%), and triamcinolone acetonide.  See the medical record for exact dosing, route, and time of administration.  Disposition: Discharge home  Discharge Date & Time: 03/13/2018; 1202 hrs.   Physician-requested Follow-up: Return in about 2 weeks (around 03/27/2018) for contralateral RFA (2 wks): (R) L-FCT RFA #1.  Future Appointments  Date Time Provider [place]  04/29/2018  8:00 AM Vevelyn Francois, NP [facility] None  05/20/2018  8:30 AM Ursula Alert, MD [facility] None   Primary Care Physician: Sharyne Peach, MD Location: [facility] Note by: Gaspar Cola, MD Date: 03/13/2018; Time: 12:21 PM  Disclaimer:  Medicine is not an Chief Strategy Officer. The only guarantee in medicine is that nothing is guaranteed. It is important to note that the decision to proceed with this intervention was based on the information collected from the patient. The Data and conclusions were drawn from the patient's questionnaire, the interview, and the physical examination. Because the  information was provided in large part by the patient, it cannot be guaranteed that it has not been purposely or unconsciously manipulated. Every effort has been made to obtain as much relevant data as possible for this evaluation. It is important to note that the conclusions that lead to this procedure are derived in large part from the available data. Always take into account that the treatment will also be dependent on availability of resources and existing treatment guidelines, considered by other Pain Management Practitioners as being common knowledge and practice, at the time of the intervention. For Medico-Legal purposes, it is also important to point out that variation in procedural techniques and pharmacological choices are the acceptable norm. The indications, contraindications, technique, and results of the above procedure should only be interpreted and judged by a Board-Certified Interventional Pain Specialist with extensive familiarity and expertise in the same exact procedure and technique.

## 2018-03-13 NOTE — Patient Instructions (Addendum)
___________________________________________________________________________________________  Post-Radiofrequency (RF) Discharge Instructions  You have just completed a Radiofrequency Neurotomy.  The following instructions will provide you with information and guidelines for self-care upon discharge.  If at any time you have questions or concerns please call your physician. DO NOT DRIVE YOURSELF!!  Instructions:  Apply ice: Fill a plastic sandwich bag with crushed ice. Cover it with a small towel and apply to injection site. Apply for 15 minutes then remove x 15 minutes. Repeat sequence on day of procedure, until you go to bed. The purpose is to minimize swelling and discomfort after procedure.  Apply heat: Apply heat to procedure site starting the day following the procedure. The purpose is to treat any soreness and discomfort from the procedure.  Food intake: No eating limitations, unless stipulated above.  Nevertheless, if you have had sedation, you may experience some nausea.  In this case, it may be wise to wait at least two hours prior to resuming regular diet.  Physical activities: Keep activities to a minimum for the first 8 hours after the procedure. For the first 24 hours after the procedure, do not drive a motor vehicle,  Operate heavy machinery, power tools, or handle any weapons.  Consider walking with the use of an assistive device or accompanied by an adult for the first 24 hours.  Do not drink alcoholic beverages including beer.  Do not make any important decisions or sign any legal documents. Go home and rest today.  Resume activities tomorrow, as tolerated.  Use caution in moving about as you may experience mild leg weakness.  Use caution in cooking, use of household electrical appliances and climbing steps.  Driving: If you have received any sedation, you are not allowed to drive for 24 hours after your procedure.  Blood thinner: Restart your blood thinner 6 hours after your  procedure. (Only for those taking blood thinners)  Insulin: As soon as you can eat, you may resume your normal dosing schedule. (Only for those taking insulin)  Medications: May resume pre-procedure medications.  Do not take any drugs, other than what has been prescribed to you.  Infection prevention: Keep procedure site clean and dry.  Post-procedure Pain Diary: Extremely important that this be done correctly and accurately. Recorded information will be used to determine the next step in treatment.  Pain evaluated is that of treated area only. Do not include pain from an untreated area.  Complete every hour, on the hour, for the initial 8 hours. Set an alarm to help you do this part accurately.  Do not go to sleep and have it completed later. It will not be accurate.  Follow-up appointment: Keep your follow-up appointment after the procedure. Usually 2 weeks for most procedures. (6 weeks in the case of radiofrequency.) Bring you pain diary.   Expect:  From numbing medicine (AKA: Local Anesthetics): Numbness or decrease in pain.  Onset: Full effect within 15 minutes of injected.  Duration: It will depend on the type of local anesthetic used. On the average, 1 to 8 hours.   From steroids: Decrease in swelling or inflammation. Once inflammation is improved, relief of the pain will follow.  Onset of benefits: Depends on the amount of swelling present. The more swelling, the longer it will take for the benefits to be seen. In some cases, up to 10 days.  Duration: Steroids will stay in the system x 2 weeks. Duration of benefits will depend on multiple posibilities including persistent irritating factors.  From procedure: Some   discomfort is to be expected once the numbing medicine wears off. This should be minimal if ice and heat are applied as instructed.  Call if:  You experience numbness and weakness that gets worse with time, as opposed to wearing off.  He experience any unusual  bleeding, difficulty breathing, or loss of the ability to control your bowel and bladder. (This applies to Spinal procedures only)  You experience any redness, swelling, heat, red streaks, elevated temperature, fever, or any other signs of a possible infection.  Emergency Numbers:  [facility] business hours (Monday - Thursday, 8:00 AM - 4:00 PM) (Friday, 9:00 AM - 12:00 Noon): [phone]  After hours: [phone] ____________________________________________________________________________________________   Hydrocodone - apap 5-325 mg x weeks to fill on 03/13/18 and 03/20/18

## 2018-03-13 NOTE — Progress Notes (Signed)
Safety precautions to be maintained throughout the outpatient stay will include: orient to surroundings, keep bed in low position, maintain call bell within reach at all times, provide assistance with transfer out of bed and ambulation.   Nose bleed at end of procedure. Care management given, saftery measures used. It bleeds a lot when I wear cpap everynight"

## 2018-03-14 ENCOUNTER — Telehealth: Payer: Self-pay

## 2018-03-14 DIAGNOSIS — I2510 Atherosclerotic heart disease of native coronary artery without angina pectoris: Secondary | ICD-10-CM | POA: Diagnosis not present

## 2018-03-14 NOTE — Telephone Encounter (Signed)
Post procedure phone call.  LM 

## 2018-03-15 ENCOUNTER — Other Ambulatory Visit: Payer: Self-pay | Admitting: Psychiatry

## 2018-03-15 DIAGNOSIS — I2510 Atherosclerotic heart disease of native coronary artery without angina pectoris: Secondary | ICD-10-CM | POA: Diagnosis not present

## 2018-03-16 DIAGNOSIS — I2510 Atherosclerotic heart disease of native coronary artery without angina pectoris: Secondary | ICD-10-CM | POA: Diagnosis not present

## 2018-03-17 ENCOUNTER — Telehealth: Payer: Self-pay | Admitting: *Deleted

## 2018-03-17 DIAGNOSIS — I2510 Atherosclerotic heart disease of native coronary artery without angina pectoris: Secondary | ICD-10-CM | POA: Diagnosis not present

## 2018-03-17 NOTE — Telephone Encounter (Signed)
States nosebleed stopped about 2 hours post procedure. Instructed patient to contact primary care and let them know about this. She states she will call/go today.

## 2018-03-18 DIAGNOSIS — I2510 Atherosclerotic heart disease of native coronary artery without angina pectoris: Secondary | ICD-10-CM | POA: Diagnosis not present

## 2018-03-19 DIAGNOSIS — I2510 Atherosclerotic heart disease of native coronary artery without angina pectoris: Secondary | ICD-10-CM | POA: Diagnosis not present

## 2018-03-20 DIAGNOSIS — I2510 Atherosclerotic heart disease of native coronary artery without angina pectoris: Secondary | ICD-10-CM | POA: Diagnosis not present

## 2018-03-21 DIAGNOSIS — I2510 Atherosclerotic heart disease of native coronary artery without angina pectoris: Secondary | ICD-10-CM | POA: Diagnosis not present

## 2018-03-22 DIAGNOSIS — I2510 Atherosclerotic heart disease of native coronary artery without angina pectoris: Secondary | ICD-10-CM | POA: Diagnosis not present

## 2018-03-23 DIAGNOSIS — I2510 Atherosclerotic heart disease of native coronary artery without angina pectoris: Secondary | ICD-10-CM | POA: Diagnosis not present

## 2018-03-24 DIAGNOSIS — I2510 Atherosclerotic heart disease of native coronary artery without angina pectoris: Secondary | ICD-10-CM | POA: Diagnosis not present

## 2018-03-25 DIAGNOSIS — I2510 Atherosclerotic heart disease of native coronary artery without angina pectoris: Secondary | ICD-10-CM | POA: Diagnosis not present

## 2018-03-26 DIAGNOSIS — J019 Acute sinusitis, unspecified: Secondary | ICD-10-CM | POA: Diagnosis not present

## 2018-03-26 DIAGNOSIS — J111 Influenza due to unidentified influenza virus with other respiratory manifestations: Secondary | ICD-10-CM | POA: Diagnosis not present

## 2018-03-26 DIAGNOSIS — I2510 Atherosclerotic heart disease of native coronary artery without angina pectoris: Secondary | ICD-10-CM | POA: Diagnosis not present

## 2018-03-26 DIAGNOSIS — B9689 Other specified bacterial agents as the cause of diseases classified elsewhere: Secondary | ICD-10-CM | POA: Diagnosis not present

## 2018-03-26 DIAGNOSIS — G4733 Obstructive sleep apnea (adult) (pediatric): Secondary | ICD-10-CM | POA: Diagnosis not present

## 2018-03-26 DIAGNOSIS — J029 Acute pharyngitis, unspecified: Secondary | ICD-10-CM | POA: Diagnosis not present

## 2018-03-26 DIAGNOSIS — R6889 Other general symptoms and signs: Secondary | ICD-10-CM | POA: Diagnosis not present

## 2018-03-27 DIAGNOSIS — I2510 Atherosclerotic heart disease of native coronary artery without angina pectoris: Secondary | ICD-10-CM | POA: Diagnosis not present

## 2018-03-28 ENCOUNTER — Encounter: Payer: Self-pay | Admitting: Emergency Medicine

## 2018-03-28 ENCOUNTER — Other Ambulatory Visit: Payer: Self-pay

## 2018-03-28 ENCOUNTER — Emergency Department: Payer: Medicare HMO

## 2018-03-28 ENCOUNTER — Emergency Department
Admission: EM | Admit: 2018-03-28 | Discharge: 2018-03-28 | Disposition: A | Discharge status: Home / Self Care | Payer: Medicare HMO | Attending: Emergency Medicine | Admitting: Emergency Medicine

## 2018-03-28 DIAGNOSIS — I509 Heart failure, unspecified: Secondary | ICD-10-CM | POA: Diagnosis not present

## 2018-03-28 DIAGNOSIS — J069 Acute upper respiratory infection, unspecified: Secondary | ICD-10-CM | POA: Insufficient documentation

## 2018-03-28 DIAGNOSIS — Z8673 Personal history of transient ischemic attack (TIA), and cerebral infarction without residual deficits: Secondary | ICD-10-CM | POA: Diagnosis not present

## 2018-03-28 DIAGNOSIS — I252 Old myocardial infarction: Secondary | ICD-10-CM | POA: Insufficient documentation

## 2018-03-28 DIAGNOSIS — Z7982 Long term (current) use of aspirin: Secondary | ICD-10-CM | POA: Diagnosis not present

## 2018-03-28 DIAGNOSIS — R112 Nausea with vomiting, unspecified: Secondary | ICD-10-CM | POA: Diagnosis not present

## 2018-03-28 DIAGNOSIS — E860 Dehydration: Secondary | ICD-10-CM | POA: Diagnosis not present

## 2018-03-28 DIAGNOSIS — R509 Fever, unspecified: Secondary | ICD-10-CM | POA: Insufficient documentation

## 2018-03-28 DIAGNOSIS — R000 Tachycardia, unspecified: Secondary | ICD-10-CM | POA: Diagnosis not present

## 2018-03-28 DIAGNOSIS — Z79899 Other long term (current) drug therapy: Secondary | ICD-10-CM | POA: Insufficient documentation

## 2018-03-28 DIAGNOSIS — E1122 Type 2 diabetes mellitus with diabetic chronic kidney disease: Secondary | ICD-10-CM | POA: Diagnosis not present

## 2018-03-28 DIAGNOSIS — Z7984 Long term (current) use of oral hypoglycemic drugs: Secondary | ICD-10-CM | POA: Diagnosis not present

## 2018-03-28 DIAGNOSIS — R0602 Shortness of breath: Secondary | ICD-10-CM | POA: Diagnosis not present

## 2018-03-28 DIAGNOSIS — R05 Cough: Secondary | ICD-10-CM | POA: Diagnosis not present

## 2018-03-28 DIAGNOSIS — I2510 Atherosclerotic heart disease of native coronary artery without angina pectoris: Secondary | ICD-10-CM | POA: Diagnosis not present

## 2018-03-28 DIAGNOSIS — N183 Chronic kidney disease, stage 3 (moderate): Secondary | ICD-10-CM | POA: Insufficient documentation

## 2018-03-28 DIAGNOSIS — J449 Chronic obstructive pulmonary disease, unspecified: Secondary | ICD-10-CM | POA: Insufficient documentation

## 2018-03-28 DIAGNOSIS — F17210 Nicotine dependence, cigarettes, uncomplicated: Secondary | ICD-10-CM | POA: Insufficient documentation

## 2018-03-28 DIAGNOSIS — I130 Hypertensive heart and chronic kidney disease with heart failure and stage 1 through stage 4 chronic kidney disease, or unspecified chronic kidney disease: Secondary | ICD-10-CM | POA: Insufficient documentation

## 2018-03-28 LAB — BASIC METABOLIC PANEL
Anion gap: 9 (ref 5–15)
BUN: 15 mg/dL (ref 6–20)
CHLORIDE: 106 mmol/L (ref 98–111)
CO2: 24 mmol/L (ref 22–32)
Calcium: 8.8 mg/dL — ABNORMAL LOW (ref 8.9–10.3)
Creatinine, Ser: 0.85 mg/dL (ref 0.44–1.00)
GFR calc Af Amer: >60 mL/min (ref >60)
GFR calc non Af Amer: >60 mL/min (ref >60)
Glucose, Bld: 229 mg/dL — ABNORMAL HIGH (ref 70–99)
Potassium: 3.4 mmol/L — ABNORMAL LOW (ref 3.5–5.1)
Sodium: 139 mmol/L (ref 135–145)

## 2018-03-28 LAB — CBC
HCT: 39.7 % (ref 36.0–46.0)
Hemoglobin: 13 g/dL (ref 12.0–15.0)
MCH: 30.2 pg (ref 26.0–34.0)
MCHC: 32.7 g/dL (ref 30.0–36.0)
MCV: 92.3 fL (ref 80.0–100.0)
Platelets: 201 10*3/uL (ref 150–400)
RBC: 4.3 MIL/uL (ref 3.87–5.11)
RDW: 15.3 % (ref 11.5–15.5)
WBC: 8.8 10*3/uL (ref 4.0–10.5)
nRBC: 0 % (ref 0.0–0.2)

## 2018-03-28 MED ORDER — SODIUM CHLORIDE 0.9 % IV BOLUS
1000.0000 mL | Freq: Once | INTRAVENOUS | Status: AC
  Administered 2018-03-28: 1000 mL via INTRAVENOUS

## 2018-03-28 MED ORDER — ALBUTEROL SULFATE (2.5 MG/3ML) 0.083% IN NEBU
5.0000 mg | INHALATION_SOLUTION | Freq: Once | RESPIRATORY_TRACT | Status: AC
  Administered 2018-03-28: 5 mg via RESPIRATORY_TRACT
  Filled 2018-03-28: qty 6

## 2018-03-28 MED ORDER — ACETAMINOPHEN 500 MG PO TABS
1000.0000 mg | ORAL_TABLET | Freq: Once | ORAL | Status: AC
  Administered 2018-03-28: 1000 mg via ORAL
  Filled 2018-03-28: qty 2

## 2018-03-28 NOTE — ED Provider Notes (Signed)
[facility] Emergency Department Provider Note ___________________________________________   I have reviewed the triage vital signs and the nursing notes.   HISTORY  Chief Complaint Emesis; Cough; and Fever   History limited by: Not Limited   HPI Marie Clarke is a 48 y.o. female who presents to the emergency department today with complaints of persistent cough, shortness of breath, fevers as well as nausea vomiting.  Patient states symptoms have been going on for the past roughly 5 days.  Was seen shortly after the symptoms started.  Had a negative flu test however was started on both doxycycline and Tamiflu.  Since that time her symptoms have been persistent.  She subsequently developed the nausea and vomiting after starting the medications.  Patient has had fevers.  She does states she has a history of COPD and has been trying her home breathing treatments with minimal relief.   Per medical record review patient has a history of COPD, CAD.   Past Medical History:  Diagnosis Date  . Anemia   . Anxiety   . Asthma   . Bipolar disorder ([place])   . CAD (coronary artery disease) unk  . CHF (congestive heart failure) ([place])   . COPD (chronic obstructive pulmonary disease) ([place])   . Depression unk  . Diabetes mellitus without complication ([place])   . Diabetes mellitus, type II ([place])   . Drug overdose   . GERD (gastroesophageal reflux disease)   . Headache   . Hyperlipidemia   . Hypertension   . Left leg pain 04/29/2014  . MI (myocardial infarction) ([place])   . Muscle ache 09/16/2014  . Osteoporosis   . Overactive bladder   . Pancreatitis unk  . Reflex sympathetic dystrophy   . Renal insufficiency   . Sleep apnea    pt reported on 2/6/7 she currently is not using CPAP  . Sleep apnea   . Stroke ([place])   . Thyroid disease   . TIA (transient ischemic attack) unk  . TIA (transient ischemic attack)     Patient Active Problem List   Diagnosis Date Noted  . Acute  postoperative pain 03/13/2018  . Disease related peripheral neuropathy 11/13/2017  . Gastroesophageal reflux disease without esophagitis 11/13/2017  . Occipital headache (Bilateral) 11/13/2017  . Cervicogenic headache (Bilateral) (L>R) 11/13/2017  . History of postoperative nausea 10/22/2017  . Chronic shoulder pain (Fifth Area of Pain) (Bilateral) (L>R) 10/09/2017  . CRPS (complex regional pain syndrome) type 1 of lower limb (left ankle) 10/09/2017  . Ankle joint instability (Left) 10/09/2017  . Ankle sprain, sequela (Left) 10/09/2017  . History of psychiatric symptoms 10/09/2017  . Chronic pain syndrome 10/02/2017  . Spondylosis without myelopathy or radiculopathy, lumbosacral region 10/02/2017  . Chronic musculoskeletal pain 10/02/2017  . Elevated C-reactive protein (CRP) 09/10/2017  . Elevated sed rate 09/10/2017  . Chronic neck pain (Fourth Area of Pain) (Bilateral) (L>R) 09/09/2017  . Pharmacologic therapy 09/09/2017  . Disorder of skeletal system 09/09/2017  . Problems influencing health status 09/09/2017  . Long term current use of opiate analgesic 09/09/2017  . Tobacco use disorder 07/16/2017  . Ventral hernia without obstruction or gangrene 06/25/2017  . Strain of extensor muscle, fascia and tendon of left index finger at wrist and hand level, initial encounter 05/16/2017  . Sepsis ([place]) 04/14/2017  . Osteopenia 04/03/2017  . Hypotension 09/17/2016  . Contusion of knee (Left) 10/12/2015  . Strain of knee (Left) 10/12/2015  . Incidental lung nodule 04/28/2015  . Chronic pain 03/28/2015  .  Chronic low back pain (Primary Area of Pain) (Bilateral) (L>R) 03/28/2015  . Chronic lower extremity pain (Referred) (Secondary Area of Pain) (Left) 03/28/2015  . Abdominal wound dehiscence 03/28/2015  . Encounter for pain management planning 03/28/2015  . Morbid obesity ([place]) 03/28/2015  . Abnormal CT scan, lumbar spine 03/28/2015  . Lumbar facet hypertrophy 03/28/2015  . Lumbar facet  syndrome (Bilateral) (L>R) 03/28/2015  . Lumbar foraminal stenosis (Bilateral) (L5-S1) 03/28/2015  . Chronic ankle pain [facility] Area of Pain) (Left) 03/28/2015  . Neurogenic pain 03/28/2015  . Neuropathic pain 03/28/2015  . Myofascial pain 03/28/2015  . History of suicide attempt 03/28/2015  . 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 Abnormal gait 12/15/2014  . Congestive heart failure ([place]) 11/15/2014  . Abdominal wall abscess 09/20/2014  . 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 Diabetes mellitus, type 2 ([place]) 08/13/2014  . Bipolar affective disorder ([place]) 08/13/2014  . Type 2 diabetes mellitus ([place]) 08/13/2014  . Rectal prolapse 08/09/2014  . Rectal bleeding 08/09/2014  . Rectal bleed 08/09/2014  . Affective bipolar disorder ([place]) 08/05/2014  . Arteriosclerosis of coronary artery 08/05/2014  . CCF (congestive cardiac failure) ([place]) 08/05/2014  . Chronic kidney disease 08/05/2014  . 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 Apnea, sleep 08/05/2014  . Temporary cerebral vascular dysfunction 08/05/2014  . Polypharmacy 04/29/2014  . Other long term (current) drug therapy 04/29/2014  . 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 Chronic kidney disease, stage III (moderate) ([place]) 12/14/2013  . Controlled diabetes mellitus type II without complication ([place]) 39/04/90  . Essential (primary) hypertension 12/03/2013  . Adult hypothyroidism 12/03/2013  . Controlled type 2 diabetes mellitus without complication ([place]) 33/00/7622    Past Surgical History:  Procedure Laterality Date  . ABDOMINAL HYSTERECTOMY    . HERNIA REPAIR  07/15/2017  . prolapse rectum surgery N/A July 2016  . TONSILLECTOMY      Prior to Admission medications   Medication Sig Start Date End Date Taking? Authorizing Provider  acetaminophen (TYLENOL) 325 MG tablet Take 1 tablet (325 mg total) by mouth every 6 (six) hours as needed for mild pain (or Fever >/= 101). 09/18/16   Gouru, Aruna, MD  aspirin EC 81 MG tablet Take 81 mg  by mouth daily at 12 noon.    [provider]  atorvastatin (LIPITOR) 40 MG tablet Take 40 mg by mouth at bedtime.     [provider]  azelastine (ASTELIN) 0.1 % nasal spray Place 1 spray into both nostrils daily.    [provider]  bumetanide (BUMEX) 2 MG tablet Take 1 tablet (2 mg total) by mouth 2 (two) times daily. 09/20/16 12/05/17  Nicholes Mango, MD  busPIRone (BUSPAR) 10 MG tablet Take 2 tablets (20 mg total) by mouth 3 (three) times daily. 01/01/18   Ursula Alert, MD  diphenoxylate-atropine (LOMOTIL) 2.5-0.025 MG tablet Take by mouth. 12/25/17 11/29/18  [provider]  gabapentin (NEURONTIN) 600 MG tablet Take 2 tablets (1,200 mg total) by mouth 3 (three) times daily. 11/13/17 05/12/18  Milinda Pointer, MD  hydrOXYzine (VISTARIL) 25 MG capsule TAKE 1 CAPSULE BY MOUTH 3 TIMES DAILY ASNEEDED FOR ANXIETY 03/17/18   Eappen, Ria Clock, MD  KLOR-CON M20 20 MEQ tablet Take 20 mEq by mouth daily.  10/13/15   [provider]  lamoTRIgine (LAMICTAL) 25 MG tablet Take 3 tablets (75 mg total) by mouth daily. 02/18/18   Ursula Alert, MD  Lancets (ACCU-CHEK SAFE-T PRO) lancets  09/30/17   [provider]  levothyroxine (SYNTHROID, LEVOTHROID) 200 MCG tablet Take  200 mcg by mouth daily.     [provider]  Magnesium Oxide 500 MG CAPS Take 1 capsule (500 mg total) by mouth 2 (two) times daily at 8 am and 10 pm. 01/22/18 07/21/18  Vevelyn Francois, NP  meloxicam (MOBIC) 7.5 MG tablet Take 7.5 mg by mouth daily.    [provider]  metFORMIN (GLUCOPHAGE) 500 MG tablet  08/26/17   [provider]  methocarbamol (ROBAXIN) 750 MG tablet Take 1 tablet (750 mg total) by mouth every 8 (eight) hours as needed for muscle spasms. 01/22/18 07/21/18  Vevelyn Francois, NP  montelukast (SINGULAIR) 10 MG tablet Take 10 mg by mouth daily.     [provider]  MYRBETRIQ 50 MG TB24 tablet  12/19/17   [provider]  NICOTROL 10 MG  inhaler  11/11/17   [provider]  omega-3 fish oil (MAXEPA) 1000 MG CAPS capsule  08/15/17   [provider]  ondansetron (ZOFRAN-ODT) 8 MG disintegrating tablet Take 8 mg by mouth as needed. 01/21/18   [provider]  promethazine (PHENERGAN) 25 MG tablet Take 1 tablet (25 mg total) by mouth every 6 (six) hours as needed for nausea or vomiting. 08/01/17   Earleen Newport, MD  QUEtiapine (SEROQUEL) 50 MG tablet Take 1.5 tablets (75 mg total) by mouth at bedtime. 02/18/18   Ursula Alert, MD  rizatriptan (MAXALT) 10 MG tablet Take 10 mg by mouth as needed. 01/03/18   [provider]  traMADol (ULTRAM) 50 MG tablet Take 1 tablet (50 mg total) by mouth every 6 (six) hours as needed for severe pain. 02/04/18 05/05/18  Vevelyn Francois, NP  TRELEGY ELLIPTA 100-62.5-25 MCG/INH AEPB  08/19/17   [provider]  varenicline (CHANTIX) 0.5 MG tablet Take 1 tablet (0.5 mg total) by mouth 2 (two) times daily. Patient not taking: Reported on 03/13/2018 02/18/18   Ursula Alert, MD    Allergies Diazepam; Levofloxacin; Metronidazole; Pregabalin; Sulfa antibiotics; Cephalexin; Ciprofloxacin; Ziprasidone hcl; and Penicillins  Family History  Problem Relation Age of Onset  . Diabetes Mellitus II Mother   . CAD Mother   . Sleep apnea Mother   . Osteoarthritis Mother   . Osteoporosis Mother   . Anxiety disorder Mother   . Depression Mother   . Bipolar disorder Mother   . Bipolar disorder Father   . Hypertension Father   . Depression Father   . Anxiety disorder Father   . Post-traumatic stress disorder Sister     Social History Social History   Tobacco Use  . Smoking status: Current Every Day Smoker    Packs/day: 0.50    Types: Cigarettes  . Smokeless tobacco: Never Used  Substance Use Topics  . Alcohol use: No    Alcohol/week: 0.0 standard drinks  . Drug use: No    Review of Systems Constitutional: Positive for fevers.  Eyes: No visual  changes. ENT: No sore throat. Cardiovascular: Denies chest pain. Respiratory: Positive for shortness of breath and cough. Gastrointestinal: No abdominal pain. Positive for nausea and vomiting.  Genitourinary: Negative for dysuria. Musculoskeletal: Negative for back pain. Skin: Negative for rash. Neurological: Negative for headaches, focal weakness or numbness.  ____________________________________________   PHYSICAL EXAM:  VITAL SIGNS: ED Triage Vitals  Enc Vitals Group     BP 03/28/18 1206 123/87     Pulse Rate 03/28/18 1206 (!) 124     Resp 03/28/18 1206 (!) 22     Temp 03/28/18 1206 99.5 F (  37.5 C)     Temp Source 03/28/18 1206 Oral     SpO2 03/28/18 1206 95 %     Weight 03/28/18 1207 205 lb (93 kg)     Height 03/28/18 1207 5\' 4"  (1.626 m)     Head Circumference --      Peak Flow --      Pain Score 03/28/18 1207 0   Constitutional: Alert and oriented.  Eyes: Conjunctivae are normal.  ENT      Head: Normocephalic and atraumatic.      Nose: No congestion/rhinnorhea.      Mouth/Throat: Mucous membranes are moist.      Neck: No stridor. Hematological/Lymphatic/Immunilogical: No cervical lymphadenopathy. Cardiovascular: Tachycardia, regular rhythm.  No murmurs, rubs, or gallops.  Respiratory: Normal respiratory effort without tachypnea nor retractions. Mild expiratory wheezing.  Gastrointestinal: Soft and non tender. No rebound. No guarding.  Genitourinary: Deferred Musculoskeletal: Normal range of motion in all extremities. No lower extremity edema. Neurologic:  Normal speech and language. No gross focal neurologic deficits are appreciated.  Skin:  Skin is warm, dry and intact. No rash noted. Psychiatric: Mood and affect are normal. Speech and behavior are normal. Patient exhibits appropriate insight and judgment.  ____________________________________________    LABS (pertinent positives/negatives)  CBC wbc 8.8, hgb 13.0, plt 201 BMp wnl except k 3.4, glu 229, ca  8.8  ____________________________________________   EKG  I, Nance Pear, attending physician, personally viewed and interpreted this EKG  EKG Time: 1219 Rate: 120 Rhythm: sinus tachycardia Axis: left axis deviation Intervals: qtc 452 QRS: narrow ST changes: no st elevation Impression: abnormal ekg ____________________________________________    RADIOLOGY  CXR Bronchitic changes  ____________________________________________   PROCEDURES  Procedures  ____________________________________________   INITIAL IMPRESSION / ASSESSMENT AND PLAN / ED COURSE  Pertinent labs & imaging results that were available during my care of the patient were reviewed by me and considered in my medical decision making (see chart for details).   Patient presented to the emergency department today with symptoms consistent with upper respiratory infection.  X-ray today does not show any pneumonia but does show bronchitic changes.  Patient had a recent negative flu test.  At this point I think upper respiratory infection likely.  Unfortunately I do think the Tamiflu and doxycycline likely cause some of the nausea and vomiting.  I discussed this with the patient.  At this point I think it is safe for patient to stop both medications given that there is no obvious bacterial infection and patient's flu was negative.  Patient was given IV fluids here and did feel better after treatment.   ____________________________________________   FINAL CLINICAL IMPRESSION(S) / ED DIAGNOSES  Final diagnoses:  Upper respiratory tract infection, unspecified type  Dehydration     Note: This dictation was prepared with Dragon dictation. Any transcriptional errors that result from this process are unintentional     Nance Pear, MD 03/28/18 1751

## 2018-03-28 NOTE — ED Triage Notes (Signed)
Pt in via POV, reports cough, fever since Monday, seen at walk in clinic on Monday as well, reports negative flu screen but started on Tamiflu.  Since has developed N/V, reports she is unable to keep anything down.  Cough, fever, shortness of breath has continued without any relief.  Pt with low grade fever, tachycardic upon arrival, other vitals WDL.

## 2018-03-28 NOTE — Discharge Instructions (Addendum)
Please seek medical attention for any high fevers, chest pain, shortness of breath, change in behavior, persistent vomiting, bloody stool or any other new or concerning symptoms.  

## 2018-03-28 NOTE — ED Notes (Signed)
Patient transported to X-ray 

## 2018-03-29 DIAGNOSIS — I2510 Atherosclerotic heart disease of native coronary artery without angina pectoris: Secondary | ICD-10-CM | POA: Diagnosis not present

## 2018-03-30 DIAGNOSIS — I2510 Atherosclerotic heart disease of native coronary artery without angina pectoris: Secondary | ICD-10-CM | POA: Diagnosis not present

## 2018-03-31 DIAGNOSIS — I2510 Atherosclerotic heart disease of native coronary artery without angina pectoris: Secondary | ICD-10-CM | POA: Diagnosis not present

## 2018-04-01 DIAGNOSIS — I2510 Atherosclerotic heart disease of native coronary artery without angina pectoris: Secondary | ICD-10-CM | POA: Diagnosis not present

## 2018-04-02 DIAGNOSIS — I2510 Atherosclerotic heart disease of native coronary artery without angina pectoris: Secondary | ICD-10-CM | POA: Diagnosis not present

## 2018-04-03 DIAGNOSIS — I2510 Atherosclerotic heart disease of native coronary artery without angina pectoris: Secondary | ICD-10-CM | POA: Diagnosis not present

## 2018-04-05 DIAGNOSIS — I2510 Atherosclerotic heart disease of native coronary artery without angina pectoris: Secondary | ICD-10-CM | POA: Diagnosis not present

## 2018-04-06 DIAGNOSIS — I2510 Atherosclerotic heart disease of native coronary artery without angina pectoris: Secondary | ICD-10-CM | POA: Diagnosis not present

## 2018-04-07 DIAGNOSIS — N3941 Urge incontinence: Secondary | ICD-10-CM | POA: Diagnosis not present

## 2018-04-07 DIAGNOSIS — I2510 Atherosclerotic heart disease of native coronary artery without angina pectoris: Secondary | ICD-10-CM | POA: Diagnosis not present

## 2018-04-08 DIAGNOSIS — I2510 Atherosclerotic heart disease of native coronary artery without angina pectoris: Secondary | ICD-10-CM | POA: Diagnosis not present

## 2018-04-09 DIAGNOSIS — I2510 Atherosclerotic heart disease of native coronary artery without angina pectoris: Secondary | ICD-10-CM | POA: Diagnosis not present

## 2018-04-10 DIAGNOSIS — I2510 Atherosclerotic heart disease of native coronary artery without angina pectoris: Secondary | ICD-10-CM | POA: Diagnosis not present

## 2018-04-11 DIAGNOSIS — I2510 Atherosclerotic heart disease of native coronary artery without angina pectoris: Secondary | ICD-10-CM | POA: Diagnosis not present

## 2018-04-12 DIAGNOSIS — I2510 Atherosclerotic heart disease of native coronary artery without angina pectoris: Secondary | ICD-10-CM | POA: Diagnosis not present

## 2018-04-13 DIAGNOSIS — I2510 Atherosclerotic heart disease of native coronary artery without angina pectoris: Secondary | ICD-10-CM | POA: Diagnosis not present

## 2018-04-14 DIAGNOSIS — I2510 Atherosclerotic heart disease of native coronary artery without angina pectoris: Secondary | ICD-10-CM | POA: Diagnosis not present

## 2018-04-15 DIAGNOSIS — I2510 Atherosclerotic heart disease of native coronary artery without angina pectoris: Secondary | ICD-10-CM | POA: Diagnosis not present

## 2018-04-16 DIAGNOSIS — I2510 Atherosclerotic heart disease of native coronary artery without angina pectoris: Secondary | ICD-10-CM | POA: Diagnosis not present

## 2018-04-17 DIAGNOSIS — I2510 Atherosclerotic heart disease of native coronary artery without angina pectoris: Secondary | ICD-10-CM | POA: Diagnosis not present

## 2018-04-18 DIAGNOSIS — I2510 Atherosclerotic heart disease of native coronary artery without angina pectoris: Secondary | ICD-10-CM | POA: Diagnosis not present

## 2018-04-19 DIAGNOSIS — I2510 Atherosclerotic heart disease of native coronary artery without angina pectoris: Secondary | ICD-10-CM | POA: Diagnosis not present

## 2018-04-20 DIAGNOSIS — I2510 Atherosclerotic heart disease of native coronary artery without angina pectoris: Secondary | ICD-10-CM | POA: Diagnosis not present

## 2018-04-21 DIAGNOSIS — I2510 Atherosclerotic heart disease of native coronary artery without angina pectoris: Secondary | ICD-10-CM | POA: Diagnosis not present

## 2018-04-22 ENCOUNTER — Ambulatory Visit
Admission: RE | Admit: 2018-04-22 | Discharge: 2018-04-22 | Disposition: A | Discharge status: Home / Self Care | Payer: Medicare HMO | Source: Ambulatory Visit | Attending: Pain Medicine | Admitting: Pain Medicine

## 2018-04-22 ENCOUNTER — Encounter: Payer: Self-pay | Admitting: Pain Medicine

## 2018-04-22 ENCOUNTER — Other Ambulatory Visit: Payer: Self-pay

## 2018-04-22 ENCOUNTER — Ambulatory Visit (HOSPITAL_BASED_OUTPATIENT_CLINIC_OR_DEPARTMENT_OTHER): Payer: Medicare HMO | Admitting: Pain Medicine

## 2018-04-22 VITALS — BP 110/83 | HR 102 | Temp 98.3°F | Resp 19 | Ht 64.0 in | Wt 205.0 lb

## 2018-04-22 DIAGNOSIS — M47817 Spondylosis without myelopathy or radiculopathy, lumbosacral region: Secondary | ICD-10-CM

## 2018-04-22 DIAGNOSIS — M545 Low back pain, unspecified: Secondary | ICD-10-CM

## 2018-04-22 DIAGNOSIS — G8929 Other chronic pain: Secondary | ICD-10-CM | POA: Diagnosis not present

## 2018-04-22 DIAGNOSIS — G894 Chronic pain syndrome: Secondary | ICD-10-CM

## 2018-04-22 DIAGNOSIS — M5137 Other intervertebral disc degeneration, lumbosacral region: Secondary | ICD-10-CM | POA: Insufficient documentation

## 2018-04-22 DIAGNOSIS — G8918 Other acute postprocedural pain: Secondary | ICD-10-CM

## 2018-04-22 DIAGNOSIS — M51379 Other intervertebral disc degeneration, lumbosacral region without mention of lumbar back pain or lower extremity pain: Secondary | ICD-10-CM | POA: Insufficient documentation

## 2018-04-22 DIAGNOSIS — M47816 Spondylosis without myelopathy or radiculopathy, lumbar region: Secondary | ICD-10-CM

## 2018-04-22 MED ORDER — FENTANYL CITRATE (PF) 100 MCG/2ML IJ SOLN
25.0000 ug | INTRAMUSCULAR | Status: DC | PRN
  Administered 2018-04-22: 50 ug via INTRAVENOUS
  Filled 2018-04-22: qty 2

## 2018-04-22 MED ORDER — TRAMADOL HCL 50 MG PO TABS: 50.0000 mg | ORAL_TABLET | Freq: Four times a day (QID) | ORAL | 2 refills | Status: DC | PRN

## 2018-04-22 MED ORDER — TRIAMCINOLONE ACETONIDE 40 MG/ML IJ SUSP
40.0000 mg | Freq: Once | INTRAMUSCULAR | Status: AC
  Administered 2018-04-22: 40 mg
  Filled 2018-04-22: qty 1

## 2018-04-22 MED ORDER — ROPIVACAINE HCL 2 MG/ML IJ SOLN
9.0000 mL | Freq: Once | INTRAMUSCULAR | Status: AC
  Administered 2018-04-22: 9 mL via PERINEURAL
  Filled 2018-04-22: qty 10

## 2018-04-22 MED ORDER — MIDAZOLAM HCL 5 MG/5ML IJ SOLN
1.0000 mg | INTRAMUSCULAR | Status: DC | PRN
  Administered 2018-04-22: 3 mg via INTRAVENOUS
  Filled 2018-04-22: qty 5

## 2018-04-22 MED ORDER — HYDROCODONE-ACETAMINOPHEN 5-325 MG PO TABS: 1.0000 | ORAL_TABLET | Freq: Three times a day (TID) | ORAL | 0 refills | Status: AC | PRN

## 2018-04-22 MED ORDER — LIDOCAINE HCL 2 % IJ SOLN
20.0000 mL | Freq: Once | INTRAMUSCULAR | Status: AC
  Administered 2018-04-22: 400 mg

## 2018-04-22 MED ORDER — LACTATED RINGERS IV SOLN
1000.0000 mL | Freq: Once | INTRAVENOUS | Status: AC
  Administered 2018-04-22: 1000 mL via INTRAVENOUS

## 2018-04-22 MED ORDER — HYDROCODONE-ACETAMINOPHEN 5-325 MG PO TABS: 1.0000 | ORAL_TABLET | Freq: Three times a day (TID) | ORAL | 0 refills | Status: DC | PRN

## 2018-04-22 NOTE — Progress Notes (Signed)
Safety precautions to be maintained throughout the outpatient stay will include: orient to surroundings, keep bed in low position, maintain call bell within reach at all times, provide assistance with transfer out of bed and ambulation.  

## 2018-04-22 NOTE — Patient Instructions (Signed)

## 2018-04-22 NOTE — Progress Notes (Signed)
Patient's Name: Marie Clarke  MRN: [id number]  Referring Provider: Milinda Pointer, MD  DOB: May 27, 1970  PCP: Sharyne Peach, MD  DOS: 04/22/2018  Note by: Gaspar Cola, MD  Service setting: Ambulatory outpatient  Specialty: Interventional Pain Management  Patient type: Established  Location: [facility] (AMB) Pain Management Facility  Visit type: Interventional Procedure   Primary Reason for Visit: Interventional Pain Management Treatment. CC: Back Pain (lower)  Procedure:          Anesthesia, Analgesia, Anxiolysis:  Type: Thermal Lumbar Facet, Medial Branch Radiofrequency Ablation/Neurotomy  #1  Primary Purpose: Therapeutic Region: Posterolateral Lumbosacral Spine Level: L2, L3, L4, L5, & S1 Medial Branch Level(s). These levels will denervate the L3-4, L4-5, and the L5-S1 lumbar facet joints. Laterality: Right  Type: Moderate (Conscious) Sedation combined with Local Anesthesia Indication(s): Analgesia and Anxiety Route: Intravenous (IV) IV Access: Secured Sedation: Meaningful verbal contact was maintained at all times during the procedure  Local Anesthetic: Lidocaine 1-2%  Position: Prone   Indications: 1. Spondylosis without myelopathy or radiculopathy, lumbosacral region   2. Lumbar facet syndrome (Bilateral) (L>R)   3. Lumbar facet hypertrophy   4. DDD (degenerative disc disease), lumbosacral   5. Chronic low back pain (Primary Area of Pain) (Bilateral) (L>R)    [name] has been dealing with the above chronic pain for longer than three months and has either failed to respond, was unable to tolerate, or simply did not get enough benefit from other more conservative therapies including, but not limited to: 1. Over-the-counter medications 2. Anti-inflammatory medications 3. Muscle relaxants 4. Membrane stabilizers 5. Opioids 6. Physical therapy and/or chiropractic manipulation 7. Modalities (Heat, ice, etc.) 8. Invasive techniques such as nerve blocks. [name] has attained  more than 50% relief of the pain from a series of diagnostic injections conducted in separate occasions.  Pain Score: Pre-procedure: 5 /10 Post-procedure: 0-No pain/10  Pre-op Assessment:  Marie Clarke is a 48 y.o. (year old), female patient, seen today for interventional treatment. She  has a past surgical history that includes prolapse rectum surgery (N/A, July 2016); Abdominal hysterectomy; Tonsillectomy; and Hernia repair (07/15/2017). Marie Clarke has a current medication list which includes the following prescription(s): acetaminophen, aspirin ec, atorvastatin, azelastine, buspirone, diphenoxylate-atropine, gabapentin, hydroxyzine, klor-con m20, lamotrigine, accu-chek safe-t pro, levothyroxine, magnesium oxide, meloxicam, metformin, methocarbamol, montelukast, myrbetriq, nicotrol, omega-3 fish oil, ondansetron, promethazine, quetiapine, rizatriptan, tramadol, trelegy ellipta, varenicline, bumetanide, hydrocodone-acetaminophen, and hydrocodone-acetaminophen, and the following Facility-Administered Medications: fentanyl and midazolam. Her primarily concern today is the Back Pain (lower)  Initial Vital Signs:  Pulse/HCG Rate: (!) 102ECG Heart Rate: 95 Temp: 97.8 F (36.6 C) Resp: 18 BP: 111/86 SpO2: 95 %  BMI: Estimated body mass index is 35.19 kg/m as calculated from the following:   Height as of this encounter: 5\' 4"  (1.626 m).   Weight as of this encounter: 205 lb (93 kg).  Risk Assessment: Allergies: Reviewed. She is allergic to diazepam; levofloxacin; metronidazole; pregabalin; sulfa antibiotics; cephalexin; ciprofloxacin; ziprasidone hcl; and penicillins.  Allergy Precautions: None required Coagulopathies: Reviewed. None identified.  Blood-thinner therapy: None at this time Active Infection(s): Reviewed. None identified. Marie Clarke is afebrile  Site Confirmation: Marie Clarke was asked to confirm the procedure and laterality before marking the site Procedure checklist: Completed Consent:  Before the procedure and under the influence of no sedative(s), amnesic(s), or anxiolytics, the patient was informed of the treatment options, risks and possible complications. To fulfill our ethical and legal obligations, as recommended by the American Medical Association's Code  of Ethics, I have informed the patient of my clinical impression; the nature and purpose of the treatment or procedure; the risks, benefits, and possible complications of the intervention; the alternatives, including doing nothing; the risk(s) and benefit(s) of the alternative treatment(s) or procedure(s); and the risk(s) and benefit(s) of doing nothing. The patient was provided information about the general risks and possible complications associated with the procedure. These may include, but are not limited to: failure to achieve desired goals, infection, bleeding, organ or nerve damage, allergic reactions, paralysis, and death. In addition, the patient was informed of those risks and complications associated to Spine-related procedures, such as failure to decrease pain; infection (i.e.: Meningitis, epidural or intraspinal abscess); bleeding (i.e.: epidural hematoma, subarachnoid hemorrhage, or any other type of intraspinal or peri-dural bleeding); organ or nerve damage (i.e.: Any type of peripheral nerve, nerve root, or spinal cord injury) with subsequent damage to sensory, motor, and/or autonomic systems, resulting in permanent pain, numbness, and/or weakness of one or several areas of the body; allergic reactions; (i.e.: anaphylactic reaction); and/or death. Furthermore, the patient was informed of those risks and complications associated with the medications. These include, but are not limited to: allergic reactions (i.e.: anaphylactic or anaphylactoid reaction(s)); adrenal axis suppression; blood sugar elevation that in diabetics may result in ketoacidosis or comma; water retention that in patients with history of congestive heart  failure may result in shortness of breath, pulmonary edema, and decompensation with resultant heart failure; weight gain; swelling or edema; medication-induced neural toxicity; particulate matter embolism and blood vessel occlusion with resultant organ, and/or nervous system infarction; and/or aseptic necrosis of one or more joints. Finally, the patient was informed that Medicine is not an exact science; therefore, there is also the possibility of unforeseen or unpredictable risks and/or possible complications that may result in a catastrophic outcome. The patient indicated having understood very clearly. We have given the patient no guarantees and we have made no promises. Enough time was given to the patient to ask questions, all of which were answered to the patient's satisfaction. Ms. Karis has indicated that she wanted to continue with the procedure. Attestation: I, the ordering provider, attest that I have discussed with the patient the benefits, risks, side-effects, alternatives, likelihood of achieving goals, and potential problems during recovery for the procedure that I have provided informed consent. Date  Time: 04/22/2018 10:12 AM  Pre-Procedure Preparation:  Monitoring: As per clinic protocol. Respiration, ETCO2, SpO2, BP, heart rate and rhythm monitor placed and checked for adequate function Safety Precautions: Patient was assessed for positional comfort and pressure points before starting the procedure. Time-out: I initiated and conducted the "Time-out" before starting the procedure, as per protocol. The patient was asked to participate by confirming the accuracy of the "Time Out" information. Verification of the correct person, site, and procedure were performed and confirmed by me, the nursing staff, and the patient. "Time-out" conducted as per Joint Commission's Universal Protocol (UP.01.01.01). Time: 1146  Description of Procedure:          Laterality: Right Levels:  L2, L3, L4, L5, &  S1 Medial Branch Level(s), at the L3-4, L4-5, and the L5-S1 lumbar facet joints. Area Prepped: Lumbosacral Prepping solution: ChloraPrep (2% chlorhexidine gluconate and 70% isopropyl alcohol) Safety Precautions: Aspiration looking for blood return was conducted prior to all injections. At no point did we inject any substances, as a needle was being advanced. Before injecting, the patient was told to immediately notify me if she was experiencing any new onset  of "ringing in the ears, or metallic taste in the mouth". No attempts were made at seeking any paresthesias. Safe injection practices and needle disposal techniques used. Medications properly checked for expiration dates. SDV (single dose vial) medications used. After the completion of the procedure, all disposable equipment used was discarded in the proper designated medical waste containers. Local Anesthesia: Protocol guidelines were followed. The patient was positioned over the fluoroscopy table. The area was prepped in the usual manner. The time-out was completed. The target area was identified using fluoroscopy. A 12-in long, straight, sterile hemostat was used with fluoroscopic guidance to locate the targets for each level blocked. Once located, the skin was marked with an approved surgical skin marker. Once all sites were marked, the skin (epidermis, dermis, and hypodermis), as well as deeper tissues (fat, connective tissue and muscle) were infiltrated with a small amount of a short-acting local anesthetic, loaded on a 10cc syringe with a 25G, 1.5-in  Needle. An appropriate amount of time was allowed for local anesthetics to take effect before proceeding to the next step. Local Anesthetic: Lidocaine 2.0% The unused portion of the local anesthetic was discarded in the proper designated containers. Technical explanation of process:  Radiofrequency Ablation (RFA) L2 Medial Branch Nerve RFA: The target area for the L2 medial branch is at the junction  of the postero-lateral aspect of the superior articular process and the superior, posterior, and medial edge of the transverse process of L3. Under fluoroscopic guidance, a Radiofrequency needle was inserted until contact was made with os over the superior postero-lateral aspect of the pedicular shadow (target area). Sensory and motor testing was conducted to properly adjust the position of the needle. Once satisfactory placement of the needle was achieved, the numbing solution was slowly injected after negative aspiration for blood. 2.0 mL of the nerve block solution was injected without difficulty or complication. After waiting for at least 3 minutes, the ablation was performed. Once completed, the needle was removed intact. L3 Medial Branch Nerve RFA: The target area for the L3 medial branch is at the junction of the postero-lateral aspect of the superior articular process and the superior, posterior, and medial edge of the transverse process of L4. Under fluoroscopic guidance, a Radiofrequency needle was inserted until contact was made with os over the superior postero-lateral aspect of the pedicular shadow (target area). Sensory and motor testing was conducted to properly adjust the position of the needle. Once satisfactory placement of the needle was achieved, the numbing solution was slowly injected after negative aspiration for blood. 2.0 mL of the nerve block solution was injected without difficulty or complication. After waiting for at least 3 minutes, the ablation was performed. Once completed, the needle was removed intact. L4 Medial Branch Nerve RFA: The target area for the L4 medial branch is at the junction of the postero-lateral aspect of the superior articular process and the superior, posterior, and medial edge of the transverse process of L5. Under fluoroscopic guidance, a Radiofrequency needle was inserted until contact was made with os over the superior postero-lateral aspect of the pedicular  shadow (target area). Sensory and motor testing was conducted to properly adjust the position of the needle. Once satisfactory placement of the needle was achieved, the numbing solution was slowly injected after negative aspiration for blood. 2.0 mL of the nerve block solution was injected without difficulty or complication. After waiting for at least 3 minutes, the ablation was performed. Once completed, the needle was removed intact. L5 Medial Branch Nerve  RFA: The target area for the L5 medial branch is at the junction of the postero-lateral aspect of the superior articular process of S1 and the superior, posterior, and medial edge of the sacral ala. Under fluoroscopic guidance, a Radiofrequency needle was inserted until contact was made with os over the superior postero-lateral aspect of the pedicular shadow (target area). Sensory and motor testing was conducted to properly adjust the position of the needle. Once satisfactory placement of the needle was achieved, the numbing solution was slowly injected after negative aspiration for blood. 2.0 mL of the nerve block solution was injected without difficulty or complication. After waiting for at least 3 minutes, the ablation was performed. Once completed, the needle was removed intact. S1 Medial Branch Nerve RFA: The target area for the S1 medial branch is located inferior to the junction of the S1 superior articular process and the L5 inferior articular process, posterior, inferior, and lateral to the 6 o'clock position of the L5-S1 facet joint, just superior to the S1 posterior foramen. Under fluoroscopic guidance, the Radiofrequency needle was advanced until contact was made with os over the Target area. Sensory and motor testing was conducted to properly adjust the position of the needle. Once satisfactory placement of the needle was achieved, the numbing solution was slowly injected after negative aspiration for blood. 2.0 mL of the nerve block solution was  injected without difficulty or complication. After waiting for at least 3 minutes, the ablation was performed. Once completed, the needle was removed intact. Radiofrequency lesioning (ablation):  Radiofrequency Generator: NeuroTherm [id number] Sensory Stimulation Parameters: 50 Hz was used to locate & identify the nerve, making sure that the needle was positioned such that there was no sensory stimulation below 0.3 V or above 0.7 V. Motor Stimulation Parameters: 2 Hz was used to evaluate the motor component. Care was taken not to lesion any nerves that demonstrated motor stimulation of the lower extremities at an output of less than 2.5 times that of the sensory threshold, or a maximum of 2.0 V. Lesioning Technique Parameters: Standard Radiofrequency settings. (Not bipolar or pulsed.) Temperature Settings: 80 degrees C Lesioning time: 60 seconds Intra-operative Compliance: Compliant Materials & Medications: Needle(s) (Electrode/Cannula) Type: Teflon-coated, curved tip, Radiofrequency needle(s) Gauge: 22G Length: 10cm Numbing solution: 0.2% PF-Ropivacaine + Triamcinolone (40 mg/mL) diluted to a final concentration of 4 mg of Triamcinolone/mL of Ropivacaine The unused portion of the solution was discarded in the proper designated containers.  Once the entire procedure was completed, the treated area was cleaned, making sure to leave some of the prepping solution back to take advantage of its long term bactericidal properties.  Illustration of the posterior view of the lumbar spine and the posterior neural structures. Laminae of L2 through S1 are labeled. DPRL5, dorsal primary ramus of L5; DPRS1, dorsal primary ramus of S1; DPR3, dorsal primary ramus of L3; FJ, facet (zygapophyseal) joint L3-L4; I, inferior articular process of L4; LB1, lateral branch of dorsal primary ramus of L1; IAB, inferior articular branches from L3 medial branch (supplies L4-L5 facet joint); IBP, intermediate branch plexus; MB3,  medial branch of dorsal primary ramus of L3; NR3, third lumbar nerve root; S, superior articular process of L5; SAB, superior articular branches from L4 (supplies L4-5 facet joint also); TP3, transverse process of L3.  Vitals:   04/22/18 1216 04/22/18 1226 04/22/18 1236 04/22/18 1246  BP: 122/84 108/90 108/84 110/83  Pulse:      Resp: 15 19 (!) 21 19  Temp:  98.3 F (36.8 C)  98.3 F (36.8 C)  TempSrc:      SpO2: 94% 94% 96% 96%  Weight:      Height:        Start Time: 1146 hrs. End Time: 1215 hrs.  Imaging Guidance (Spinal):          Type of Imaging Technique: Fluoroscopy Guidance (Spinal) Indication(s): Assistance in needle guidance and placement for procedures requiring needle placement in or near specific anatomical locations not easily accessible without such assistance. Exposure Time: Please see nurses notes. Contrast: None used. Fluoroscopic Guidance: I was personally present during the use of fluoroscopy. "Tunnel Vision Technique" used to obtain the best possible view of the target area. Parallax error corrected before commencing the procedure. "Direction-depth-direction" technique used to introduce the needle under continuous pulsed fluoroscopy. Once target was reached, antero-posterior, oblique, and lateral fluoroscopic projection used confirm needle placement in all planes. Images permanently stored in EMR. Interpretation: No contrast injected. I personally interpreted the imaging intraoperatively. Adequate needle placement confirmed in multiple planes. Permanent images saved into the patient's record.  Antibiotic Prophylaxis:   Anti-infectives (From admission, onward)   None     Indication(s): None identified  Post-operative Assessment:  Post-procedure Vital Signs:  Pulse/HCG Rate: (!) 102(!) 104 Temp: 98.3 F (36.8 C) Resp: 19 BP: 110/83 SpO2: 96 %  EBL: None  Complications: No immediate post-treatment complications observed by team, or reported by  patient.  Note: The patient tolerated the entire procedure well. A repeat set of vitals were taken after the procedure and the patient was kept under observation following institutional policy, for this type of procedure. Post-procedural neurological assessment was performed, showing return to baseline, prior to discharge. The patient was provided with post-procedure discharge instructions, including a section on how to identify potential problems. Should any problems arise concerning this procedure, the patient was given instructions to immediately contact us, at any time, without hesitation. In any case, we plan to contact the patient by telephone for a follow-up status report regarding this interventional procedure.  Comments:  No additional relevant information.  Plan of Care  Orders:  Orders Placed This Encounter  Procedures  . Radiofrequency,Lumbar    Scheduling Instructions:     Side(s): Right-sided     Level: L3-4, L4-5, & L5-S1 Facets (L2, L3, L4, L5, & S1 Medial Branch Nerves)     Sedation: With Sedation     Timeframe: Today    Order Specific Question:   Where will this procedure be performed?    Answer:   [facility]  . DG C-Arm 1-60 Min-No Report    Intraoperative interpretation by procedural physician at [facility].    Standing Status:   Standing    Number of Occurrences:   1    Order Specific Question:   Reason for exam:    Answer:   Assistance in needle guidance and placement for procedures requiring needle placement in or near specific anatomical locations not easily accessible without such assistance.  . Provider attestation of informed consent for procedure/surgical case    I, the ordering provider, attest that I have discussed with the patient the benefits, risks, side effects, alternatives, likelihood of achieving goals and potential problems during recovery for the procedure that I have provided informed consent.    Standing Status:   Standing     Number of Occurrences:   1  . Informed Consent Details: Transcribe to consent form and obtain patient signature    Consent Attestation: I, the ordering provider,  attest that I have discussed with the patient the benefits, risks, side-effects, alternatives, likelihood of achieving goals, and potential problems during recovery for the procedure that I have provided informed consent.    Standing Status:   Standing    Number of Occurrences:   1    Order Specific Question:   Procedure    Answer:   Right Lumbar facet medial branch RFA under fluoroscopic guidance.    Order Specific Question:   Surgeon    Answer:   Kathlen Brunswick. Dossie Arbour, MD    Order Specific Question:   Indication/Reason    Answer:   Chronic low back pain secondary to lumbar facet syndrome   Medications ordered for procedure: Meds ordered this encounter  Medications  . lidocaine (XYLOCAINE) 2 % (with pres) injection 400 mg  . midazolam (VERSED) 5 MG/5ML injection 1-2 mg    Make sure Flumazenil is available in the pyxis when using this medication. If oversedation occurs, administer 0.2 mg IV over 15 sec. If after 45 sec no response, administer 0.2 mg again over 1 min; may repeat at 1 min intervals; not to exceed 4 doses (1 mg)  . fentaNYL (SUBLIMAZE) injection 25-50 mcg    Make sure Narcan is available in the pyxis when using this medication. In the event of respiratory depression (RR< 8/min): Titrate NARCAN (naloxone) in increments of 0.1 to 0.2 mg IV at 2-3 minute intervals, until desired degree of reversal.  . lactated ringers infusion 1,000 mL  . ropivacaine (PF) 2 mg/mL (0.2%) (NAROPIN) injection 9 mL  . triamcinolone acetonide (KENALOG-40) injection 40 mg  . traMADol (ULTRAM) 50 MG tablet    Sig: Take 1 tablet (50 mg total) by mouth every 6 (six) hours as needed for severe pain.    Dispense:  120 tablet    Refill:  2    Do not place this medication, or any other prescription from our practice, on "Automatic Refill". Patient  may have prescription filled one day early if pharmacy is closed on scheduled refill date.  Marland Kitchen HYDROcodone-acetaminophen (NORCO/VICODIN) 5-325 MG tablet    Sig: Take 1 tablet by mouth every 8 (eight) hours as needed for up to 7 days for severe pain. Must last 7 days.    Dispense:  21 tablet    Refill:  0    For acute post-operative pain. Not to be refilled. Must last 7 days.  Marland Kitchen HYDROcodone-acetaminophen (NORCO/VICODIN) 5-325 MG tablet    Sig: Take 1 tablet by mouth every 8 (eight) hours as needed for up to 7 days for severe pain. Must last for 7 days.    Dispense:  21 tablet    Refill:  0    For acute post-operative pain. Not to be refilled. Must last 7 days.   Medications administered: We administered lidocaine, midazolam, fentaNYL, lactated ringers, ropivacaine (PF) 2 mg/mL (0.2%), and triamcinolone acetonide.  See the medical record for exact dosing, route, and time of administration.  Disposition: Discharge home  Discharge Date & Time: 04/22/2018; 1246 hrs.   Follow-up plan:   Return for PPE (RF) (6 wks), w/ NP.     Future Appointments  Date Time Provider [place]  04/29/2018  8:00 AM Vevelyn Francois, NP [facility] None  05/20/2018  8:30 AM Ursula Alert, MD [facility] None  06/03/2018 10:45 AM Vevelyn Francois, NP [facility] None   Primary Care Physician: Sharyne Peach, MD Location: [facility] Note by: Gaspar Cola, MD Date: 04/22/2018; Time:  1:52 PM  Disclaimer:  Medicine is not an Chief Strategy Officer. The only guarantee in medicine is that nothing is guaranteed. It is important to note that the decision to proceed with this intervention was based on the information collected from the patient. The Data and conclusions were drawn from the patient's questionnaire, the interview, and the physical examination. Because the information was provided in large part by the patient, it cannot be guaranteed that it has not been purposely or unconsciously  manipulated. Every effort has been made to obtain as much relevant data as possible for this evaluation. It is important to note that the conclusions that lead to this procedure are derived in large part from the available data. Always take into account that the treatment will also be dependent on availability of resources and existing treatment guidelines, considered by other Pain Management Practitioners as being common knowledge and practice, at the time of the intervention. For Medico-Legal purposes, it is also important to point out that variation in procedural techniques and pharmacological choices are the acceptable norm. The indications, contraindications, technique, and results of the above procedure should only be interpreted and judged by a Board-Certified Interventional Pain Specialist with extensive familiarity and expertise in the same exact procedure and technique.

## 2018-04-23 ENCOUNTER — Telehealth: Payer: Self-pay | Admitting: *Deleted

## 2018-04-23 ENCOUNTER — Encounter: Payer: Self-pay | Admitting: Pain Medicine

## 2018-04-23 DIAGNOSIS — I2510 Atherosclerotic heart disease of native coronary artery without angina pectoris: Secondary | ICD-10-CM | POA: Diagnosis not present

## 2018-04-23 NOTE — Telephone Encounter (Signed)
No problems post procedure. 

## 2018-04-24 DIAGNOSIS — I2510 Atherosclerotic heart disease of native coronary artery without angina pectoris: Secondary | ICD-10-CM | POA: Diagnosis not present

## 2018-04-25 ENCOUNTER — Telehealth: Payer: Self-pay

## 2018-04-25 DIAGNOSIS — I2510 Atherosclerotic heart disease of native coronary artery without angina pectoris: Secondary | ICD-10-CM | POA: Diagnosis not present

## 2018-04-25 NOTE — Telephone Encounter (Signed)
Received message that patient needs refills on all her meds, per review of her records she is not due for any refills yet. Please ask her to request through pharmacy when she is due.

## 2018-04-25 NOTE — Telephone Encounter (Signed)
pt left message states she needs refills on her medication . burpar, vistaril, seroquel

## 2018-04-26 DIAGNOSIS — I2510 Atherosclerotic heart disease of native coronary artery without angina pectoris: Secondary | ICD-10-CM | POA: Diagnosis not present

## 2018-04-27 DIAGNOSIS — I2510 Atherosclerotic heart disease of native coronary artery without angina pectoris: Secondary | ICD-10-CM | POA: Diagnosis not present

## 2018-04-28 DIAGNOSIS — I2510 Atherosclerotic heart disease of native coronary artery without angina pectoris: Secondary | ICD-10-CM | POA: Diagnosis not present

## 2018-04-29 ENCOUNTER — Encounter: Payer: Medicare HMO | Admitting: Nurse Practitioner

## 2018-04-29 DIAGNOSIS — I2510 Atherosclerotic heart disease of native coronary artery without angina pectoris: Secondary | ICD-10-CM | POA: Diagnosis not present

## 2018-04-30 ENCOUNTER — Encounter: Payer: Self-pay | Admitting: Pain Medicine

## 2018-04-30 DIAGNOSIS — I2510 Atherosclerotic heart disease of native coronary artery without angina pectoris: Secondary | ICD-10-CM | POA: Diagnosis not present

## 2018-05-01 DIAGNOSIS — I2510 Atherosclerotic heart disease of native coronary artery without angina pectoris: Secondary | ICD-10-CM | POA: Diagnosis not present

## 2018-05-02 ENCOUNTER — Telehealth: Payer: Self-pay | Admitting: Nurse Practitioner

## 2018-05-02 DIAGNOSIS — I2510 Atherosclerotic heart disease of native coronary artery without angina pectoris: Secondary | ICD-10-CM | POA: Diagnosis not present

## 2018-05-02 NOTE — Telephone Encounter (Signed)
Per screening phone call from pnm Marie Clarke, patient is afraid to come out and has runny nose. Canceled Mon. Appt. Please send meds in for refills.  Thank you

## 2018-05-03 DIAGNOSIS — I2510 Atherosclerotic heart disease of native coronary artery without angina pectoris: Secondary | ICD-10-CM | POA: Diagnosis not present

## 2018-05-04 DIAGNOSIS — I2510 Atherosclerotic heart disease of native coronary artery without angina pectoris: Secondary | ICD-10-CM | POA: Diagnosis not present

## 2018-05-05 ENCOUNTER — Encounter: Payer: Medicare HMO | Admitting: Nurse Practitioner

## 2018-05-05 ENCOUNTER — Other Ambulatory Visit: Payer: Self-pay | Admitting: Nurse Practitioner

## 2018-05-05 DIAGNOSIS — G894 Chronic pain syndrome: Secondary | ICD-10-CM

## 2018-05-05 DIAGNOSIS — M7918 Myalgia, other site: Secondary | ICD-10-CM

## 2018-05-05 DIAGNOSIS — G8929 Other chronic pain: Secondary | ICD-10-CM

## 2018-05-05 DIAGNOSIS — K219 Gastro-esophageal reflux disease without esophagitis: Secondary | ICD-10-CM

## 2018-05-05 MED ORDER — METHOCARBAMOL 750 MG PO TABS: 750.0000 mg | ORAL_TABLET | Freq: Three times a day (TID) | ORAL | 0 refills | Status: DC | PRN

## 2018-05-05 MED ORDER — MAGNESIUM OXIDE -MG SUPPLEMENT 500 MG PO CAPS: 1.0000 | ORAL_CAPSULE | Freq: Two times a day (BID) | ORAL | 1 refills | Status: DC

## 2018-05-05 MED ORDER — TRAMADOL HCL 50 MG PO TABS: 50.0000 mg | ORAL_TABLET | Freq: Four times a day (QID) | ORAL | 2 refills | Status: DC | PRN

## 2018-05-05 NOTE — Telephone Encounter (Signed)
Prescription sent

## 2018-05-06 DIAGNOSIS — N898 Other specified noninflammatory disorders of vagina: Secondary | ICD-10-CM | POA: Diagnosis not present

## 2018-05-12 ENCOUNTER — Encounter: Payer: Self-pay | Admitting: Pain Medicine

## 2018-05-13 DIAGNOSIS — J439 Emphysema, unspecified: Secondary | ICD-10-CM | POA: Diagnosis not present

## 2018-05-14 ENCOUNTER — Ambulatory Visit: Payer: Medicare HMO | Attending: Nurse Practitioner | Admitting: Nurse Practitioner

## 2018-05-14 ENCOUNTER — Other Ambulatory Visit: Payer: Self-pay

## 2018-05-14 DIAGNOSIS — M47817 Spondylosis without myelopathy or radiculopathy, lumbosacral region: Secondary | ICD-10-CM

## 2018-05-14 DIAGNOSIS — G894 Chronic pain syndrome: Secondary | ICD-10-CM | POA: Diagnosis not present

## 2018-05-14 DIAGNOSIS — R519 Headache, unspecified: Secondary | ICD-10-CM

## 2018-05-14 DIAGNOSIS — R51 Headache: Secondary | ICD-10-CM

## 2018-05-14 NOTE — Progress Notes (Signed)
Pain Management Encounter Note - Virtual Visit via Telephone Telehealth (real-time audio visits between healthcare provider and patient).  Patient's Phone No. & Preferred Pharmacy:  [phone] (home); [phone] (mobile); (Preferred) [place], [place] [address] Phone: [phone] Fax: [place] [address], [place] [address] Phone: [phone] Fax: [phone]   Pre-screening note:  Our staff contacted Marie Clarke and offered her an "in person", "face-to-face" appointment versus a telephone encounter. She indicated preferring the telephone encounter, at this time.  Reason for Virtual Visit: COVID-19*  Social distancing based on CDC and AMA recommendations.   I contacted Marie Clarke on 05/14/2018 at 9:34 AM by telephone and clearly identified myself as Dionisio David, NP. I verified that I was speaking with the correct person using two identifiers (Name and date of birth: Oct 09, 1970).    Advanced Informed Consent I sought verbal advanced consent from [facility] for telemedicine interactions and virtual visit. I informed Marie Clarke of the security and privacy concerns, risks, and limitations associated with performing an evaluation and management service by telephone. I also informed Marie Clarke of the availability of "in person" appointments and I informed her of the possibility of a patient responsible charge related to this service. Marie Clarke expressed understanding and agreed to proceed.   Historic Elements   Marie Clarke is a 48 y.o. year old, female patient evaluated today after her last encounter by our practice on 05/02/2018. Marie Clarke  has a past medical history of Anemia, Anxiety, Asthma, Bipolar disorder ([place]), CAD (coronary artery disease) ([place]), CHF (congestive heart failure) ([place]), COPD (chronic obstructive pulmonary disease) ([place]),  Depression ([place]), Diabetes mellitus without complication ([place]), Diabetes mellitus, type II ([place]), Drug overdose, GERD (gastroesophageal reflux disease), Headache, Hyperlipidemia, Hypertension, Left leg pain (04/29/2014), MI (myocardial infarction) ([place]), Muscle ache (09/16/2014), Osteoporosis, Overactive bladder, Pancreatitis ([place]), Reflex sympathetic dystrophy, Renal insufficiency, Sleep apnea, Sleep apnea, Stroke ([place]), Thyroid disease, TIA (transient ischemic attack) ([place]), and TIA (transient ischemic attack). She also  has a past surgical history that includes prolapse rectum surgery (N/A, July 2016); Abdominal hysterectomy; Tonsillectomy; and Hernia repair (07/15/2017). Marie Clarke has a current medication list which includes the following prescription(s): acetaminophen, aspirin ec, atorvastatin, azelastine, bumetanide, buspirone, diphenoxylate-atropine, gabapentin, hydroxyzine, klor-con m20, lamotrigine, accu-chek safe-t pro, levothyroxine, magnesium oxide, meloxicam, metformin, methocarbamol, montelukast, myrbetriq, nicotrol, omega-3 fish oil, ondansetron, promethazine, quetiapine, rizatriptan, tramadol, trelegy ellipta, and varenicline. She  reports that she has been smoking cigarettes. She has been smoking about 0.50 packs per day. She has never used smokeless tobacco. She reports that she does not drink alcohol or use drugs. Marie Clarke is allergic to diazepam; levofloxacin; metronidazole; pregabalin; sulfa antibiotics; cephalexin; ciprofloxacin; ziprasidone hcl; and penicillins.   HPI  I last saw her on 05/02/2018. She is being evaluated for new problems. She has migraines and admits that she was started on Nortriptyline.Mar[place] Kitchen She was told by her PCP that she could no longer take the Tramadol. She denies any previous problems with the tramadol. She recently had a Right lumbar facet RFA and was given Norco for the acute pain. She admits that she took 3 tabs. She admits that she is only took 2 tabs on yesterday. She  rates her pain 8/10. She has not taken her medication today. She admit that back pain does go all the way down her left leg.     Pharmacotherapy Assessment  Analgesic:Tramadol 50 mg 1 tablet 4 times daily (fill date 09/02/2017) tramadol 200 mg/day Highest recorded MME/day:90mg /day   Monitoring: Pharmacotherapy: No side-effects or adverse reactions reported. [place] PMP: PDMP reviewed during this encounter.       Compliance: No problems identified. Plan: Refer to "POC".  Review of recent tests  DG C-Arm 1-60 Min-No Report Fluoroscopy was utilized by the requesting physician.  No radiographic  interpretation.    Admission on 03/28/2018, Discharged on 03/28/2018  Component Date Value Ref Range Status  . Sodium 03/28/2018 139  135 - 145 mmol/L Final  . Potassium 03/28/2018 3.4* 3.5 - 5.1 mmol/L Final  . Chloride 03/28/2018 106  98 - 111 mmol/L Final  . CO2 03/28/2018 24  22 - 32 mmol/L Final  . Glucose, Bld 03/28/2018 229* 70 - 99 mg/dL Final  . BUN 03/28/2018 15  6 - 20 mg/dL Final  . Creatinine, Ser 03/28/2018 0.85  0.44 - 1.00 mg/dL Final  . Calcium 03/28/2018 8.8* 8.9 - 10.3 mg/dL Final  . GFR calc non Af Amer 03/28/2018 >60  >60 mL/min Final  . GFR calc Af Amer 03/28/2018 >60  >60 mL/min Final  . Anion gap 03/28/2018 9  5 - 15 Final   Performed at [facility], [address], [place], [place] [address]  . WBC 03/28/2018 8.8  4.0 - 10.5 K/uL Final  . RBC 03/28/2018 4.30  3.87 - 5.11 MIL/uL Final  . Hemoglobin 03/28/2018 13.0  12.0 - 15.0 g/dL Final  . HCT 03/28/2018 39.7  36.0 - 46.0 % Final  . MCV 03/28/2018 92.3  80.0 - 100.0 fL Final  . MCH 03/28/2018 30.2  26.0 - 34.0 pg Final  . MCHC 03/28/2018 32.7  30.0 - 36.0 g/dL Final  . RDW 03/28/2018 15.3  11.5 - 15.5 % Final  . Platelets 03/28/2018 201  150 - 400 K/uL Final  . nRBC 03/28/2018 0.0  0.0 - 0.2 % Final   Performed at [facility], [address], [place], [place] [address]   Assessment  There  were no encounter diagnoses.  Plan of Care  I discussed the assessment and treatment plan with the patient. The patient was provided an opportunity to ask questions and all were answered. The patient agreed with the plan and demonstrated an understanding of the instructions.  Patient advised to call back or seek an in-person evaluation if the symptoms or condition worsens.  I am having [facility] maintain her atorvastatin, levothyroxine, montelukast, aspirin EC, azelastine, Klor-Con M20, acetaminophen, bumetanide, promethazine, metFORMIN, Trelegy Ellipta, omega-3 fish oil, gabapentin, Nicotrol, Myrbetriq, accu-chek safe-t pro, diphenoxylate-atropine, busPIRone, ondansetron, rizatriptan, QUEtiapine, lamoTRIgine, varenicline, meloxicam, hydrOXYzine, Magnesium Oxide, traMADol, and methocarbamol. Pharmacotherapy (Medications Ordered): No orders of the defined types were placed in this encounter.  Orders:  No orders of the defined types were placed in this encounter.  Follow-up plan:   No follow-ups on file. Will discuss with Dr Lowella Dandy if he is willing to start the patient on Norco vs the Tramadol   Total duration of non-face-to-face encounter: 11 minutes.  Note by: Dionisio David, NP Date: 05/14/2018; Time: 9:34 AM  Disclaimer:  * Given the special circumstances of the COVID-19 pandemic, the federal government has announced that the Office for Civil Rights (OCR) will exercise its enforcement discretion and will not impose penalties on physicians using telehealth in the event of noncompliance with regulatory requirements under the [place] and Accountability Act (HIPAA) in connection with the good faith provision of telehealth  during the ZXYDS-89 national public health emergency. (AMA)

## 2018-05-15 ENCOUNTER — Encounter: Payer: Self-pay | Admitting: Pain Medicine

## 2018-05-16 ENCOUNTER — Encounter: Payer: Self-pay | Admitting: Pain Medicine

## 2018-05-20 ENCOUNTER — Ambulatory Visit: Payer: Medicare HMO | Admitting: Psychiatry

## 2018-05-20 ENCOUNTER — Encounter: Payer: Self-pay | Admitting: Psychiatry

## 2018-05-20 ENCOUNTER — Other Ambulatory Visit: Payer: Self-pay

## 2018-05-20 ENCOUNTER — Ambulatory Visit (INDEPENDENT_AMBULATORY_CARE_PROVIDER_SITE_OTHER): Payer: Medicare HMO | Admitting: Psychiatry

## 2018-05-20 DIAGNOSIS — F4310 Post-traumatic stress disorder, unspecified: Secondary | ICD-10-CM | POA: Diagnosis not present

## 2018-05-20 DIAGNOSIS — F419 Anxiety disorder, unspecified: Secondary | ICD-10-CM | POA: Diagnosis not present

## 2018-05-20 DIAGNOSIS — F17200 Nicotine dependence, unspecified, uncomplicated: Secondary | ICD-10-CM

## 2018-05-20 DIAGNOSIS — F3160 Bipolar disorder, current episode mixed, unspecified: Secondary | ICD-10-CM

## 2018-05-20 MED ORDER — LAMOTRIGINE 100 MG PO TABS: 100.0000 mg | ORAL_TABLET | Freq: Every day | ORAL | 0 refills | Status: DC

## 2018-05-20 MED ORDER — QUETIAPINE FUMARATE 50 MG PO TABS: 75.0000 mg | ORAL_TABLET | Freq: Every day | ORAL | 0 refills | Status: DC

## 2018-05-20 MED ORDER — HYDROXYZINE PAMOATE 25 MG PO CAPS: 25.0000 mg | ORAL_CAPSULE | Freq: Two times a day (BID) | ORAL | 1 refills | Status: DC | PRN

## 2018-05-20 NOTE — Progress Notes (Signed)
Virtual Visit via Video Note  I connected with Marie Clarke on 05/20/18 at  1:00 PM EDT by a video enabled telemedicine application and verified that I am speaking with the correct person using two identifiers.   I discussed the limitations of evaluation and management by telemedicine and the availability of in person appointments. The patient expressed understanding and agreed to proceed.  I discussed the assessment and treatment plan with the patient. The patient was provided an opportunity to ask questions and all were answered. The patient agreed with the plan and demonstrated an understanding of the instructions.   The patient was advised to call back or seek an in-person evaluation if the symptoms worsen or if the condition fails to improve as anticipated.  I provided 15 minutes of non-face-to-face time during this encounter.   Marie Alert, MD  [place] MD OP Progress Note  05/20/2018 1:27 PM Marie Clarke  MRN:  [id number]  Chief Complaint:  Chief Complaint    Follow-up     HPI: Marie Clarke is a 48 year old Caucasian female, single, on SSD, lives in [place], has a history of bipolar disorder, COPD, OSA on CPAP, coronary artery disease, hyperlipidemia, hypertension, hypothyroidism, back pain, recurrent abdominal surgery, was evaluated by telemedicine today.  Patient today reports that she has been having some mood swings recently.  She reports it is mostly triggered by people saying something to her and she takes it out of context.  She reports she has racing thoughts and agitation soon after that.  She is also overwhelmed due to the coronavirus outbreak.  Patient reports sleep is good.  She is compliant on her medications.  Denies any side effects.  Patient reports she has started smoking again and quit using Chantix.  She reports she is not ready to stop smoking yet.  She is not interested in Chantix prescription at this time.  She will let writer know when she is ready.  Discussed  readjusting her medications as well as discussed mindfulness, relaxation techniques.  Discussed with patient to find creative ways to spend her time since she is home all day now due to the coronavirus outbreak.  Also discussed referring her to a therapist here in clinic to start psychotherapy sessions.   Visit Diagnosis:    ICD-10-CM   1. Bipolar I disorder, most recent episode mixed (HCC) F31.60 lamoTRIgine (LAMICTAL) 100 MG tablet    QUEtiapine (SEROQUEL) 50 MG tablet  2. PTSD (post-traumatic stress disorder) F43.10 QUEtiapine (SEROQUEL) 50 MG tablet  3. Anxiety disorder, unspecified type F41.9   4. Tobacco use disorder F17.200     Past Psychiatric History: Reviewed past psychiatric history from my progress note on 03/06/2017.  Past Medical History:  Past Medical History:  Diagnosis Date  . Anemia   . Anxiety   . Asthma   . Bipolar disorder ([place])   . CAD (coronary artery disease) unk  . CHF (congestive heart failure) ([place])   . COPD (chronic obstructive pulmonary disease) ([place])   . Depression unk  . Diabetes mellitus without complication ([place])   . Diabetes mellitus, type II ([place])   . Drug overdose   . GERD (gastroesophageal reflux disease)   . Headache   . Hyperlipidemia   . Hypertension   . Left leg pain 04/29/2014  . MI (myocardial infarction) ([place])   . Muscle ache 09/16/2014  . Osteoporosis   . Overactive bladder   . Pancreatitis unk  . Reflex sympathetic dystrophy   . Renal insufficiency   . Sleep  apnea    pt reported on 2/6/7 she currently is not using CPAP  . Sleep apnea   . Stroke ([place])   . Thyroid disease   . TIA (transient ischemic attack) unk  . TIA (transient ischemic attack)     Past Surgical History:  Procedure Laterality Date  . ABDOMINAL HYSTERECTOMY    . HERNIA REPAIR  07/15/2017  . prolapse rectum surgery N/A July 2016  . TONSILLECTOMY      Family Psychiatric History: I have reviewed family psychiatric history from my progress note on  03/06/2017  Family History:  Family History  Problem Relation Age of Onset  . Diabetes Mellitus II Mother   . CAD Mother   . Sleep apnea Mother   . Osteoarthritis Mother   . Osteoporosis Mother   . Anxiety disorder Mother   . Depression Mother   . Bipolar disorder Mother   . Bipolar disorder Father   . Hypertension Father   . Depression Father   . Anxiety disorder Father   . Post-traumatic stress disorder Sister     Social History: I have reviewed social history from my progress note on 03/06/2017 Social History   Socioeconomic History  . Marital status: Single    Spouse name: Not on file  . Number of children: 0  . Years of education: Not on file  . Highest education level: High school graduate  Occupational History    Comment: not employed  Social Needs  . Financial resource strain: Not hard at all  . Food insecurity:    Worry: Never true    Inability: Never true  . Transportation needs:    Medical: Yes    Non-medical: Yes  Tobacco Use  . Smoking status: Current Every Day Smoker    Packs/day: 0.50    Types: Cigarettes  . Smokeless tobacco: Never Used  Substance and Sexual Activity  . Alcohol use: No    Alcohol/week: 0.0 standard drinks  . Drug use: No  . Sexual activity: Not Currently  Lifestyle  . Physical activity:    Days per week: 0 days    Minutes per session: 0 min  . Stress: Not at all  Relationships  . Social connections:    Talks on phone: More than three times a week    Gets together: More than three times a week    Attends religious service: More than 4 times per year    Active member of club or organization: No    Attends meetings of clubs or organizations: Never    Relationship status: Never married  Other Topics Concern  . Not on file  Social History Narrative  . Not on file    Allergies:  Allergies  Allergen Reactions  . Diazepam Hives and Nausea And Vomiting  . Levofloxacin Hives and Rash    1/31: would like to retry since not  taking valium  . Metronidazole Hives    1/31: would like to retry since she is no longer taking valium  . Pregabalin Hives and Rash    1/31 currently taking lyrica without reaction since no longer taking valium  . Sulfa Antibiotics Hives  . Cephalexin Hives  . Ciprofloxacin Hives  . Ziprasidone Hcl Other (See Comments)    CONFUSED CONFUSED Other reaction(s): Other (See Comments) CONFUSED CONFUSED   . Penicillins Nausea And Vomiting    Has patient had a PCN reaction causing immediate rash, facial/tongue/throat swelling, SOB or lightheadedness with hypotension: No Has patient had a PCN reaction  causing severe rash involving mucus membranes or skin necrosis: No Has patient had a PCN reaction that required hospitalization: No Has patient had a PCN reaction occurring within the last 10 years: No If all of the above answers are "NO", then may proceed with Cephalosporin use.     Metabolic Disorder Labs: No results found for: HGBA1C, MPG No results found for: PROLACTIN No results found for: CHOL, TRIG, HDL, CHOLHDL, VLDL, LDLCALC No results found for: TSH  Therapeutic Level Labs: No results found for: LITHIUM No results found for: VALPROATE No components found for:  CBMZ  Current Medications: Current Outpatient Medications  Medication Sig Dispense Refill  . ACCU-CHEK AVIVA PLUS test strip     . acetaminophen (TYLENOL) 325 MG tablet Take 1 tablet (325 mg total) by mouth every 6 (six) hours as needed for mild pain (or Fever >/= 101).    Mar[place] Kitchen aspirin EC 81 MG tablet Take 81 mg by mouth daily at 12 noon.    Mar[place] Kitchen aspirin EC 81 MG tablet Take by mouth.    Mar[place] Kitchen atorvastatin (LIPITOR) 40 MG tablet Take 40 mg by mouth at bedtime.     Mar[place] Kitchen azelastine (ASTELIN) 0.1 % nasal spray Place 1 spray into both nostrils daily.    . bumetanide (BUMEX) 2 MG tablet Take 1 tablet (2 mg total) by mouth 2 (two) times daily. 60 tablet 11  . busPIRone (BUSPAR) 10 MG tablet Take 2 tablets (20 mg total) by mouth 3  (three) times daily. 540 tablet 1  . diphenoxylate-atropine (LOMOTIL) 2.5-0.025 MG tablet Take by mouth.    . gabapentin (NEURONTIN) 600 MG tablet Take 2 tablets (1,200 mg total) by mouth 3 (three) times daily. 180 tablet 5  . hydrOXYzine (VISTARIL) 25 MG capsule Take 1-2 capsules (25-50 mg total) by mouth 2 (two) times daily as needed. 270 capsule 1  . KLOR-CON M20 20 MEQ tablet Take 20 mEq by mouth daily.     Mar[place] Kitchen lamoTRIgine (LAMICTAL) 100 MG tablet Take 1 tablet (100 mg total) by mouth daily. 90 tablet 0  . Lancets (ACCU-CHEK SAFE-T PRO) lancets     . levothyroxine (SYNTHROID, LEVOTHROID) 200 MCG tablet Take 200 mcg by mouth daily.     . Magnesium Oxide 500 MG CAPS Take 1 capsule (500 mg total) by mouth 2 (two) times daily at 8 am and 10 pm. 180 capsule 1  . meloxicam (MOBIC) 7.5 MG tablet Take 7.5 mg by mouth daily.    . metFORMIN (GLUCOPHAGE) 500 MG tablet     . [START ON 07/21/2018] methocarbamol (ROBAXIN) 750 MG tablet Take 1 tablet (750 mg total) by mouth every 8 (eight) hours as needed for muscle spasms. 90 tablet 0  . montelukast (SINGULAIR) 10 MG tablet Take 10 mg by mouth daily.     Mar[place] Kitchen MYRBETRIQ 50 MG TB24 tablet     . naproxen (EC NAPROSYN) 500 MG EC tablet Take by mouth.    . naproxen (NAPROSYN) 500 MG tablet     . NICOTROL 10 MG inhaler     . nortriptyline (PAMELOR) 10 MG capsule Take by mouth.    . omega-3 fish oil (MAXEPA) 1000 MG CAPS capsule     . omeprazole (PRILOSEC) 20 MG capsule     . ondansetron (ZOFRAN-ODT) 8 MG disintegrating tablet Take 8 mg by mouth as needed.    . promethazine (PHENERGAN) 25 MG tablet Take 1 tablet (25 mg total) by mouth every 6 (six) hours as needed for nausea or vomiting. [place]  tablet 0  . QUEtiapine (SEROQUEL) 50 MG tablet Take 1.5 tablets (75 mg total) by mouth at bedtime. 135 tablet 0  . rizatriptan (MAXALT) 10 MG tablet Take 10 mg by mouth as needed.    . traMADol (ULTRAM) 50 MG tablet Take 1 tablet (50 mg total) by mouth every 6 (six) hours as  needed for severe pain. 120 tablet 2  . TRELEGY ELLIPTA 100-62.5-25 MCG/INH AEPB     . VENTOLIN HFA 108 (90 Base) MCG/ACT inhaler      No current facility-administered medications for this visit.      Musculoskeletal: Strength & Muscle Tone: within normal limits Gait & Station: normal Patient leans: N/A  Psychiatric Specialty Exam: Review of Systems  Psychiatric/Behavioral: The patient is nervous/anxious.   All other systems reviewed and are negative.   There were no vitals taken for this visit.There is no height or weight on file to calculate BMI.  General Appearance: Casual  Eye Contact:  Fair  Speech:  Clear and Coherent  Volume:  Normal  Mood:  Anxious  Affect:  Congruent  Thought Process:  Goal Directed and Descriptions of Associations: Intact  Orientation:  Full (Time, Place, and Person)  Thought Content: Logical   Suicidal Thoughts:  No  Homicidal Thoughts:  No  Memory:  Immediate;   Fair Recent;   Fair Remote;   Fair  Judgement:  Fair  Insight:  Fair  Psychomotor Activity:  Normal  Concentration:  Concentration: Fair and Attention Span: Fair  Recall:  [employer] of Knowledge: Fair  Language: Fair  Akathisia:  No  Handed:  Right  AIMS (if indicated): Denies tremors, rigidity, stiffness  Assets:  Communication Skills Desire for Improvement Social Support  ADL's:  Intact  Cognition: WNL  Sleep:  Fair   Screenings: PHQ2-9     Procedure visit from 04/22/2018 in [facility] Procedure visit from 03/13/2018 in [facility] Office Visit from 12/23/2017 in [facility] [place] Procedure visit from 12/05/2017 in [facility] Office Visit from 11/13/2017 in [facility]  PHQ-2 Total Score  0  0  0  0  0       Assessment and Plan: Terriann is a 48 year old Caucasian  female who has a history of bipolar disorder, multiple medical problems as evaluated by telemedicine today.  Patient today reports that she is currently having some mood lability.  She also has some trouble coping with the COVID-19 crisis.  Discussed medication changes as well as will refer her for psychotherapy sessions with therapist here in clinic.  Plan as noted below.  Plan Bipolar disorder-unstable Increase Lamictal to 100 mg p.o. daily BuSpar 20 mg p.o. 3 times daily Seroquel 75 mg p.o. daily  For anxiety disorder-unspecified-unstable Increase hydroxyzine to 50 mg p.o. twice daily as needed Refer for CBT with therapist here in clinic.  Tobacco use disorder-unstable Discontinue Chantix for noncompliance. Patient is not ready to quit at this time.  Follow-up in clinic in 3 to 4 weeks or sooner if needed.  I have spent atleast 15 minutes non face to face with patient today. More than 50 % of the time was spent for psychoeducation and supportive psychotherapy and care coordination.  This note was generated in part or whole with voice recognition software. Voice recognition is usually quite accurate but there are transcription errors that can and very often  do occur. I apologize for any typographical errors that were not detected and corrected.            Marie Alert, MD 05/20/2018, 1:27 PM

## 2018-05-20 NOTE — Progress Notes (Signed)
Tc on  05-20-18 @ 12:18 medications and pharmacy reviewed and updated. Pt allergies reviewed with no changes.  Pt medical and surgical hx reviewed with no changes. Pt vitals were not taken at this time because visit was a phone screening.

## 2018-05-21 ENCOUNTER — Encounter: Payer: Self-pay | Admitting: Pain Medicine

## 2018-05-22 ENCOUNTER — Telehealth: Payer: Self-pay

## 2018-05-27 ENCOUNTER — Other Ambulatory Visit: Payer: Self-pay | Admitting: Pain Medicine

## 2018-05-27 DIAGNOSIS — M792 Neuralgia and neuritis, unspecified: Secondary | ICD-10-CM

## 2018-05-27 DIAGNOSIS — G6289 Other specified polyneuropathies: Secondary | ICD-10-CM

## 2018-05-27 NOTE — Telephone Encounter (Signed)
This patient has been scheduled several times and keeps pushing appt out. She already has appts scheduled that she set up for 06-03-18.

## 2018-05-29 ENCOUNTER — Encounter: Payer: Self-pay | Admitting: Pain Medicine

## 2018-06-03 ENCOUNTER — Ambulatory Visit: Payer: Medicare HMO | Attending: Nurse Practitioner | Admitting: Nurse Practitioner

## 2018-06-03 ENCOUNTER — Other Ambulatory Visit: Payer: Self-pay

## 2018-06-03 ENCOUNTER — Encounter: Payer: Self-pay | Admitting: Psychiatry

## 2018-06-03 ENCOUNTER — Ambulatory Visit: Payer: Medicare HMO | Admitting: Nurse Practitioner

## 2018-06-03 ENCOUNTER — Ambulatory Visit (INDEPENDENT_AMBULATORY_CARE_PROVIDER_SITE_OTHER): Payer: Medicare HMO | Admitting: Psychiatry

## 2018-06-03 DIAGNOSIS — G894 Chronic pain syndrome: Secondary | ICD-10-CM | POA: Diagnosis not present

## 2018-06-03 DIAGNOSIS — F3160 Bipolar disorder, current episode mixed, unspecified: Secondary | ICD-10-CM

## 2018-06-03 DIAGNOSIS — F410 Panic disorder [episodic paroxysmal anxiety] without agoraphobia: Secondary | ICD-10-CM

## 2018-06-03 DIAGNOSIS — R519 Headache, unspecified: Secondary | ICD-10-CM

## 2018-06-03 DIAGNOSIS — F17200 Nicotine dependence, unspecified, uncomplicated: Secondary | ICD-10-CM

## 2018-06-03 DIAGNOSIS — F4310 Post-traumatic stress disorder, unspecified: Secondary | ICD-10-CM

## 2018-06-03 DIAGNOSIS — M792 Neuralgia and neuritis, unspecified: Secondary | ICD-10-CM | POA: Diagnosis not present

## 2018-06-03 DIAGNOSIS — G8929 Other chronic pain: Secondary | ICD-10-CM

## 2018-06-03 DIAGNOSIS — G6289 Other specified polyneuropathies: Secondary | ICD-10-CM

## 2018-06-03 DIAGNOSIS — R51 Headache: Secondary | ICD-10-CM | POA: Diagnosis not present

## 2018-06-03 DIAGNOSIS — M7918 Myalgia, other site: Secondary | ICD-10-CM | POA: Diagnosis not present

## 2018-06-03 MED ORDER — GABAPENTIN 600 MG PO TABS: 1200.0000 mg | ORAL_TABLET | Freq: Three times a day (TID) | ORAL | 2 refills | Status: DC

## 2018-06-03 MED ORDER — QUETIAPINE FUMARATE 100 MG PO TABS: 100.0000 mg | ORAL_TABLET | Freq: Every day | ORAL | 0 refills | Status: DC

## 2018-06-03 MED ORDER — METHOCARBAMOL 750 MG PO TABS: 750.0000 mg | ORAL_TABLET | Freq: Three times a day (TID) | ORAL | 2 refills | Status: DC | PRN

## 2018-06-03 MED ORDER — PROPRANOLOL HCL 10 MG PO TABS: 10.0000 mg | ORAL_TABLET | Freq: Three times a day (TID) | ORAL | 1 refills | Status: DC | PRN

## 2018-06-03 MED ORDER — RIZATRIPTAN BENZOATE 10 MG PO TABS: 10.0000 mg | ORAL_TABLET | ORAL | 2 refills | Status: DC | PRN

## 2018-06-03 NOTE — Progress Notes (Signed)
TC on  06-03-18 @9 :23 spoke with patient reviewed patient allergies with no changes. Reviewed the medical and surgical hx with no changes.  Pt medications and pharmacy were reviewed with no changes. No vitals taken because this is a phone consult.

## 2018-06-03 NOTE — Patient Instructions (Signed)
____________________________________________________________________________________________  Medication Rules  Purpose: To inform patients, and their family members, of our rules and regulations.  Applies to: All patients receiving prescriptions (written or electronic).  Pharmacy of record: Pharmacy where electronic prescriptions will be sent. If written prescriptions are taken to a different pharmacy, please inform the nursing staff. The pharmacy listed in the electronic medical record should be the one where you would like electronic prescriptions to be sent.  Electronic prescriptions: In compliance with the [place] Strengthen Opioid Misuse Prevention (STOP) Act of 2017 (Session Law 2017-74/H243), effective February 12, 2018, all controlled substances must be electronically prescribed. Calling prescriptions to the pharmacy will cease to exist.  Prescription refills: Only during scheduled appointments. Applies to all prescriptions.  NOTE: The following applies primarily to controlled substances (Opioid* Pain Medications).   Patient's responsibilities: 1. Pain Pills: Bring all pain pills to every appointment (except for procedure appointments). 2. Pill Bottles: Bring pills in original pharmacy bottle. Always bring the newest bottle. Bring bottle, even if empty. 3. Medication refills: You are responsible for knowing and keeping track of what medications you take and those you need refilled. The day before your appointment: write a list of all prescriptions that need to be refilled. The day of the appointment: give the list to the admitting nurse. Prescriptions will be written only during appointments. No prescriptions will be written on procedure days. If you forget a medication: it will not be "Called in", "Faxed", or "electronically sent". You will need to get another appointment to get these prescribed. No early refills. Do not call asking to have your prescription filled  early. 4. Prescription Accuracy: You are responsible for carefully inspecting your prescriptions before leaving our office. Have the discharge nurse carefully go over each prescription with you, before taking them home. Make sure that your name is accurately spelled, that your address is correct. Check the name and dose of your medication to make sure it is accurate. Check the number of pills, and the written instructions to make sure they are clear and accurate. Make sure that you are given enough medication to last until your next medication refill appointment. 5. Taking Medication: Take medication as prescribed. When it comes to controlled substances, taking less pills or less frequently than prescribed is permitted and encouraged. Never take more pills than instructed. Never take medication more frequently than prescribed.  6. Inform other Doctors: Always inform, all of your healthcare providers, of all the medications you take. 7. Pain Medication from other Providers: You are not allowed to accept any additional pain medication from any other Doctor or Healthcare provider. There are two exceptions to this rule. (see below) In the event that you require additional pain medication, you are responsible for notifying us, as stated below. 8. Medication Agreement: You are responsible for carefully reading and following our Medication Agreement. This must be signed before receiving any prescriptions from our practice. Safely store a copy of your signed Agreement. Violations to the Agreement will result in no further prescriptions. (Additional copies of our Medication Agreement are available upon request.) 9. Laws, Rules, & Regulations: All patients are expected to follow all Federal and State Laws, Statutes, Rules, & Regulations. Ignorance of the Laws does not constitute a valid excuse. The use of any illegal substances is prohibited. 10. Adopted CDC guidelines & recommendations: Target dosing levels will be  at or below 60 MME/day. Use of benzodiazepines** is not recommended.  Exceptions: There are only two exceptions to the rule of not   receiving pain medications from other Healthcare Providers. 1. Exception #1 (Emergencies): In the event of an emergency (i.e.: accident requiring emergency care), you are allowed to receive additional pain medication. However, you are responsible for: As soon as you are able, call our office [phone], at any time of the day or night, and leave a message stating your name, the date and nature of the emergency, and the name and dose of the medication prescribed. In the event that your call is answered by a member of our staff, make sure to document and save the date, time, and the name of the person that took your information.  2. Exception #2 (Planned Surgery): In the event that you are scheduled by another doctor or dentist to have any type of surgery or procedure, you are allowed (for a period no longer than 30 days), to receive additional pain medication, for the acute post-op pain. However, in this case, you are responsible for picking up a copy of our "Post-op Pain Management for Surgeons" handout, and giving it to your surgeon or dentist. This document is available at our office, and does not require an appointment to obtain it. Simply go to our office during business hours (Monday-Thursday from 8:00 AM to 4:00 PM) (Friday 8:00 AM to 12:00 Noon) or if you have a scheduled appointment with us, prior to your surgery, and ask for it by name. In addition, you will need to provide us with your name, name of your surgeon, type of surgery, and date of procedure or surgery.  *Opioid medications include: morphine, codeine, oxycodone, oxymorphone, hydrocodone, hydromorphone, meperidine, tramadol, tapentadol, buprenorphine, fentanyl, methadone. **Benzodiazepine medications include: diazepam (Valium), alprazolam (Xanax), clonazepam (Klonopine), lorazepam (Ativan), clorazepate  (Tranxene), chlordiazepoxide (Librium), estazolam (Prosom), oxazepam (Serax), temazepam (Restoril), triazolam (Halcion) (Last updated: 04/11/2017) ____________________________________________________________________________________________    

## 2018-06-03 NOTE — Progress Notes (Signed)
Virtual Visit via Telephone Note  I connected with Marie Clarke on 06/03/18 at 10:30 AM EDT by telephone and verified that I am speaking with the correct person using two identifiers.   I discussed the limitations, risks, security and privacy concerns of performing an evaluation and management service by telephone and the availability of in person appointments. I also discussed with the patient that there may be a patient responsible charge related to this service. The patient expressed understanding and agreed to proceed.   I discussed the assessment and treatment plan with the patient. The patient was provided an opportunity to ask questions and all were answered. The patient agreed with the plan and demonstrated an understanding of the instructions.   The patient was advised to call back or seek an in-person evaluation if the symptoms worsen or if the condition fails to improve as anticipated.   [facility] MD OP Progress Note  06/03/2018 2:34 PM Marie Clarke  MRN:  [id number]  Chief Complaint:  Chief Complaint    Follow-up     HPI: Marie Clarke is a 48 year old Caucasian female, single, on SSD, lives in [place], history of bipolar disorder, COPD, OSA on CPAP, coronary artery disease, hyperlipidemia, hypertension, hypothyroidism, back pain, recurrent abdominal pain, was evaluated by phone today.  Patient reports that ever since her last visit she has been struggling with mood swings.  She describes mood lability, irritability, anxiety and agitation.  She also reports crying spells.  She reports she has been getting angry at her family members for simple things.  She reports she has been compliant with her medications.  She reports she is in a lot of pain from her chronic back pain as well as she reports she had an abdominal surgery in 2019 and whenever she bends now her incision is popping open.  She reports that is also causing her pain and a lot of stress.  Discussed with patient since her irritability  and agitation are also contributed to by her pain and also the abdominal incision that is causing her trouble, advised her to reach out to her providers who is managing her pain and also her provider who is treating her abdomen problem.    Patient denies any suicidality, homicidality or perceptual disturbances.  She reports sleep is good.  She was referred for psychotherapy session last visit and has an upcoming appointment with therapist here in clinic.  Discussed with her that she can be started on propranolol as needed since the hydroxyzine is not working for her anxiety symptoms.  She agrees with plan.  Also discussed readjusting her Seroquel dosage. Visit Diagnosis:    ICD-10-CM   1. Bipolar I disorder, most recent episode mixed (HCC) F31.60 QUEtiapine (SEROQUEL) 100 MG tablet    propranolol (INDERAL) 10 MG tablet  2. PTSD (post-traumatic stress disorder) F43.10   3. Panic attacks F41.0   4. Tobacco use disorder F17.200     Past Psychiatric History: I have reviewed past psychiatric history from my progress note on 03/06/2017.  Past Medical History:  Past Medical History:  Diagnosis Date  . Anemia   . Anxiety   . Asthma   . Bipolar disorder ([place])   . CAD (coronary artery disease) unk  . CHF (congestive heart failure) ([place])   . COPD (chronic obstructive pulmonary disease) ([place])   . Depression unk  . Diabetes mellitus without complication ([place])   . Diabetes mellitus, type II ([place])   . Drug overdose   . GERD (gastroesophageal reflux  disease)   . Headache   . Hyperlipidemia   . Hypertension   . Left leg pain 04/29/2014  . MI (myocardial infarction) ([place])   . Muscle ache 09/16/2014  . Osteoporosis   . Overactive bladder   . Pancreatitis unk  . Reflex sympathetic dystrophy   . Renal insufficiency   . Sleep apnea    pt reported on 2/6/7 she currently is not using CPAP  . Sleep apnea   . Stroke ([place])   . Thyroid disease   . TIA (transient ischemic attack) unk  . TIA  (transient ischemic attack)     Past Surgical History:  Procedure Laterality Date  . ABDOMINAL HYSTERECTOMY    . HERNIA REPAIR  07/15/2017  . prolapse rectum surgery N/A July 2016  . TONSILLECTOMY      Family Psychiatric History: Reviewed family psychiatric history from my progress note on 03/06/2017  Family History:  Family History  Problem Relation Age of Onset  . Diabetes Mellitus II Mother   . CAD Mother   . Sleep apnea Mother   . Osteoarthritis Mother   . Osteoporosis Mother   . Anxiety disorder Mother   . Depression Mother   . Bipolar disorder Mother   . Bipolar disorder Father   . Hypertension Father   . Depression Father   . Anxiety disorder Father   . Post-traumatic stress disorder Sister     Social History: Reviewed social history from my progress note on 03/06/2017. Social History   Socioeconomic History  . Marital status: Single    Spouse name: Not on file  . Number of children: 0  . Years of education: Not on file  . Highest education level: High school graduate  Occupational History    Comment: not employed  Social Needs  . Financial resource strain: Not hard at all  . Food insecurity:    Worry: Never true    Inability: Never true  . Transportation needs:    Medical: Yes    Non-medical: Yes  Tobacco Use  . Smoking status: Current Every Day Smoker    Packs/day: 0.50    Types: Cigarettes  . Smokeless tobacco: Never Used  Substance and Sexual Activity  . Alcohol use: No    Alcohol/week: 0.0 standard drinks  . Drug use: No  . Sexual activity: Not Currently  Lifestyle  . Physical activity:    Days per week: 0 days    Minutes per session: 0 min  . Stress: Not at all  Relationships  . Social connections:    Talks on phone: More than three times a week    Gets together: More than three times a week    Attends religious service: More than 4 times per year    Active member of club or organization: No    Attends meetings of clubs or  organizations: Never    Relationship status: Never married  Other Topics Concern  . Not on file  Social History Narrative  . Not on file    Allergies:  Allergies  Allergen Reactions  . Diazepam Hives and Nausea And Vomiting  . Levofloxacin Hives and Rash    1/31: would like to retry since not taking valium  . Metronidazole Hives    1/31: would like to retry since she is no longer taking valium  . Pregabalin Hives and Rash    1/31 currently taking lyrica without reaction since no longer taking valium  . Sulfa Antibiotics Hives  . Cephalexin Hives  .  Ciprofloxacin Hives  . Ziprasidone Hcl Other (See Comments)    CONFUSED CONFUSED Other reaction(s): Other (See Comments) CONFUSED CONFUSED   . Penicillins Nausea And Vomiting    Has patient had a PCN reaction causing immediate rash, facial/tongue/throat swelling, SOB or lightheadedness with hypotension: No Has patient had a PCN reaction causing severe rash involving mucus membranes or skin necrosis: No Has patient had a PCN reaction that required hospitalization: No Has patient had a PCN reaction occurring within the last 10 years: No If all of the above answers are "NO", then may proceed with Cephalosporin use.     Metabolic Disorder Labs: No results found for: HGBA1C, MPG No results found for: PROLACTIN No results found for: CHOL, TRIG, HDL, CHOLHDL, VLDL, LDLCALC No results found for: TSH  Therapeutic Level Labs: No results found for: LITHIUM No results found for: VALPROATE No components found for:  CBMZ  Current Medications: Current Outpatient Medications  Medication Sig Dispense Refill  . ACCU-CHEK AVIVA PLUS test strip     . acetaminophen (TYLENOL) 325 MG tablet Take 1 tablet (325 mg total) by mouth every 6 (six) hours as needed for mild pain (or Fever >/= 101).    Marland Kitchen aspirin EC 81 MG tablet Take 81 mg by mouth daily at 12 noon.    Marland Kitchen aspirin EC 81 MG tablet Take by mouth.    Marland Kitchen atorvastatin (LIPITOR) 40 MG tablet  Take 40 mg by mouth at bedtime.     Marland Kitchen azelastine (ASTELIN) 0.1 % nasal spray Place 1 spray into both nostrils daily.    . busPIRone (BUSPAR) 10 MG tablet Take 2 tablets (20 mg total) by mouth 3 (three) times daily. 540 tablet 1  . diphenoxylate-atropine (LOMOTIL) 2.5-0.025 MG tablet Take by mouth.    . gabapentin (NEURONTIN) 600 MG tablet Take 2 tablets (1,200 mg total) by mouth 3 (three) times daily. 180 tablet 2  . KLOR-CON M20 20 MEQ tablet Take 20 mEq by mouth daily.     Marland Kitchen lamoTRIgine (LAMICTAL) 100 MG tablet Take 1 tablet (100 mg total) by mouth daily. 90 tablet 0  . Lancets (ACCU-CHEK SAFE-T PRO) lancets     . levothyroxine (SYNTHROID, LEVOTHROID) 200 MCG tablet Take 200 mcg by mouth daily.     . Magnesium Oxide 500 MG CAPS Take 1 capsule (500 mg total) by mouth 2 (two) times daily at 8 am and 10 pm. 180 capsule 1  . meloxicam (MOBIC) 7.5 MG tablet Take 7.5 mg by mouth daily.    . metFORMIN (GLUCOPHAGE) 500 MG tablet     . [START ON 07/21/2018] methocarbamol (ROBAXIN) 750 MG tablet Take 1 tablet (750 mg total) by mouth every 8 (eight) hours as needed for muscle spasms. 90 tablet 2  . montelukast (SINGULAIR) 10 MG tablet Take 10 mg by mouth daily.     Marland Kitchen MYRBETRIQ 50 MG TB24 tablet     . naproxen (EC NAPROSYN) 500 MG EC tablet Take by mouth.    . naproxen (NAPROSYN) 500 MG tablet     . NICOTROL 10 MG inhaler     . nortriptyline (PAMELOR) 10 MG capsule Take by mouth.    . omega-3 fish oil (MAXEPA) 1000 MG CAPS capsule     . omeprazole (PRILOSEC) 20 MG capsule     . ondansetron (ZOFRAN-ODT) 8 MG disintegrating tablet Take 8 mg by mouth as needed.    . promethazine (PHENERGAN) 25 MG tablet Take 1 tablet (25 mg total) by mouth every 6 (six)  hours as needed for nausea or vomiting. 20 tablet 0  . rizatriptan (MAXALT) 10 MG tablet Take 1 tablet (10 mg total) by mouth as needed. 10 tablet 2  . traMADol (ULTRAM) 50 MG tablet Take 1 tablet (50 mg total) by mouth every 6 (six) hours as needed for  severe pain. 120 tablet 2  . TRELEGY ELLIPTA 100-62.5-25 MCG/INH AEPB     . VENTOLIN HFA 108 (90 Base) MCG/ACT inhaler     . bumetanide (BUMEX) 2 MG tablet Take 1 tablet (2 mg total) by mouth 2 (two) times daily. 60 tablet 11  . propranolol (INDERAL) 10 MG tablet Take 1 tablet (10 mg total) by mouth 3 (three) times daily as needed. For severe anxiety symptoms 90 tablet 1  . QUEtiapine (SEROQUEL) 100 MG tablet Take 1 tablet (100 mg total) by mouth at bedtime. For mood 90 tablet 0   No current facility-administered medications for this visit.      Musculoskeletal: Strength & Muscle Tone: UTA Gait & Station: UTA Patient leans: N/A  Psychiatric Specialty Exam: Review of Systems  Psychiatric/Behavioral: Positive for depression. The patient is nervous/anxious.   All other systems reviewed and are negative.   There were no vitals taken for this visit.There is no height or weight on file to calculate BMI.  General Appearance: UTA  Eye Contact:  UTA  Speech:  Normal Rate  Volume:  Normal  Mood:  Anxious, depressed  Affect:  Tearful  Thought Process:  Goal Directed and Descriptions of Associations: Intact  Orientation:  Full (Time, Place, and Person)  Thought Content: Logical   Suicidal Thoughts:  No  Homicidal Thoughts:  No  Memory:  Immediate;   Fair Recent;   Fair Remote;   Fair  Judgement:  Fair  Insight:  Fair  Psychomotor Activity:  UTA  Concentration:  Concentration: Fair and Attention Span: Fair  Recall:  [employer] of Knowledge: Fair  Language: Fair  Akathisia:  No  Handed:  Right  AIMS (if indicated): denies tremors, rigidity,stiffness  Assets:  Communication Skills Desire for Improvement Social Support  ADL's:  Intact  Cognition: WNL  Sleep:  Fair   Screenings: PHQ2-9     Procedure visit from 04/22/2018 in [facility] Procedure visit from 03/13/2018 in [facility] Office Visit  from 12/23/2017 in [facility] Procedure visit from 12/05/2017 in [facility] Office Visit from 11/13/2017 in [facility]  PHQ-2 Total Score  0  0  0  0  0       Assessment and Plan: Druanne is a 48 year old Caucasian female who has a history of bipolar disorder, panic attacks, tobacco use disorder, multiple medical problems was evaluated by phone today.  Patient with multiple physical problems currently is in a lot of pain as well as has abdominal problems with an incision that is giving her trouble.  Patient reported agitation and anxiety and will benefit from medication changes as well as psychotherapy sessions.  Plan as noted below.  Plan Bipolar disorder- unstable Increase Seroquel to 100 mg p.o. daily. Lamictal 100 mg p.o. daily BuSpar 20 mg p.o. 3 times daily  PTSD- improving Continue medications as prescribed.  Referred for CBT.  For panic attacks-unstable Discontinue hydroxyzine for lack of effect. Start propranolol 10 mg p.o. 3 times daily as needed Refer for CBT with therapist here in clinic.  She will start psychotherapy session soon.  For tobacco use disorder-unstable Patient is not ready to quit.  Provided smoking cessation counseling.  Follow-up in clinic in 3 to 4 weeks or sooner if needed.  I have spent atleast 15 minutes non face to face with patient today. More than 50 % of the time was spent for psychoeducation and supportive psychotherapy and care coordination.  This note was generated in part or whole with voice recognition software. Voice recognition is usually quite accurate but there are transcription errors that can and very often do occur. I apologize for any typographical errors that were not detected and corrected.         Ursula Alert, MD 06/03/2018, 2:34 PM

## 2018-06-03 NOTE — Progress Notes (Signed)
Pain Management Virtual Encounter Note - Virtual Visit via Telephone Telehealth (real-time audio visits between healthcare provider and patient).  Patient's Phone No. & Preferred Pharmacy:  [phone] (home); [phone] (mobile); (Preferred) [phone] [email or web address]  [place], [place] [address] Phone: [phone] Fax: [place] [address] [place] [address] Phone: [phone] Fax: [phone]   Pre-screening note:  Our staff contacted Marie Clarke and offered her an "in person", "face-to-face" appointment versus a telephone encounter. She indicated preferring the telephone encounter, at this time.  Reason for Virtual Visit: COVID-19*  Social distancing based on CDC and AMA recommendations.   I contacted Marie Clarke on 06/03/2018 at 8:28 AM via telephone and clearly identified myself as Dionisio David, NP. I verified that I was speaking with the correct person using two identifiers (Name and date of birth: 08-Apr-1970).  Advanced Informed Consent I sought verbal advanced consent from Marie Clarke for virtual visit interactions. I informed Marie Clarke of possible security and privacy concerns, risks, and limitations associated with providing "not-in-person" medical evaluation and management services. I also informed Marie Clarke of the availability of "in-person" appointments. Finally, I informed her that there would be a charge for the virtual visit and that she could be  personally, fully or partially, financially responsible for it. Ms. Alonge expressed understanding and agreed to proceed.   Historic Elements   Marie Clarke is a 48 y.o. year old, female patient evaluated today after her last encounter by our practice on 05/27/2018. Marie Clarke  has a past medical history of Anemia, Anxiety, Asthma, Bipolar disorder ([place]), CAD (coronary artery disease) ([place]),  CHF (congestive heart failure) ([place]), COPD (chronic obstructive pulmonary disease) ([place]), Depression ([place]), Diabetes mellitus without complication ([place]), Diabetes mellitus, type II ([place]), Drug overdose, GERD (gastroesophageal reflux disease), Headache, Hyperlipidemia, Hypertension, Left leg pain (04/29/2014), MI (myocardial infarction) ([place]), Muscle ache (09/16/2014), Osteoporosis, Overactive bladder, Pancreatitis ([place]), Reflex sympathetic dystrophy, Renal insufficiency, Sleep apnea, Sleep apnea, Stroke ([place]), Thyroid disease, TIA (transient ischemic attack) ([place]), and TIA (transient ischemic attack). She also  has a past surgical history that includes prolapse rectum surgery (N/A, July 2016); Abdominal hysterectomy; Tonsillectomy; and Hernia repair (07/15/2017). Marie Clarke has a current medication list which includes the following prescription(s): accu-chek aviva plus, acetaminophen, aspirin ec, aspirin ec, atorvastatin, azelastine, bumetanide, buspirone, diphenoxylate-atropine, gabapentin, hydroxyzine, klor-con m20, lamotrigine, accu-chek safe-t pro, levothyroxine, magnesium oxide, meloxicam, metformin, methocarbamol, montelukast, myrbetriq, naproxen, naproxen, nicotrol, nortriptyline, omega-3 fish oil, omeprazole, ondansetron, promethazine, quetiapine, rizatriptan, tramadol, trelegy ellipta, and ventolin hfa. She  reports that she has been smoking cigarettes. She has been smoking about 0.50 packs per day. She has never used smokeless tobacco. She reports that she does not drink alcohol or use drugs. Marie Clarke is allergic to diazepam; levofloxacin; metronidazole; pregabalin; sulfa antibiotics; cephalexin; ciprofloxacin; ziprasidone hcl; and penicillins.   HPI  I last saw her on 05/14/2018. She is being evaluated for both, medication management and a post-procedure assessment. She was started on Nortriptyline for Migraine started by Dr Iona Beard. She was then told that she would not be able to take the Tramadol and the  Nortriptyline together. She states that her pain is a 10/10. She is SP Lumbar Facet RFA and was given a 2 prescriptions 7 day supply of Norco 5/325mg  TID. She does not feel like the RFA was effective.  She feels like the Norco would be effective for her pain.  She states that she has not been informed of any changes with the Tramadol and Norco despite communication being sent. She does have a few of the Norco leftover however she does not want to give out of medication. She would like a refill on her Gabapentin. She feels like she would like something for the treatment of her "nerves". She states that she has bipolar and PTSD.  Post-Procedure Evaluation  Procedure: Right Lumbar Facet RFA Pre-procedure pain level:  5/10 Post-procedure: 0/10          Sedation: Please see nurses note.  Effectiveness during initial hour after procedure(Ultra-Short Term Relief): 100%  Local anesthetic used: Long-acting (4-6 hours) Effectiveness: Defined as any analgesic benefit obtained secondary to the administration of local anesthetics. This carries significant diagnostic value as to the etiological location, or anatomical origin, of the pain. Duration of benefit is expected to coincide with the duration of the local anesthetic used.  Effectiveness during initial 4-6 hours after procedure(Short-Term Relief): 100 %  Long-term benefit: Defined as any relief past the pharmacologic duration of the local anesthetics.  Effectiveness past the initial 6 hours after procedure(Long-Term Relief): 100 %She was numb for about 2 days.   Current benefits: Defined as benefit that persist at this time.   Analgesia:  Back to baseline Function: Back to baseline ROM: Back to baseline  Pharmacotherapy Assessment  Analgesic:Tramadol 50 mg 1 tablet 4 times daily (fill date 09/02/2017) tramadol 200 mg/day Highest recorded MME/day:20mg /day   Monitoring: Pharmacotherapy: No side-effects or adverse reactions reported. [place] PMP: PDMP  reviewed during this encounter.       Compliance: No problems identified or detected. Plan: Refer to "POC".  Review of recent tests  DG C-Arm 1-60 Min-No Report Fluoroscopy was utilized by the requesting physician.  No radiographic  interpretation.    Admission on 03/28/2018, Discharged on 03/28/2018  Component Date Value Ref Range Status  . Sodium 03/28/2018 139  135 - 145 mmol/L Final  . Potassium 03/28/2018 3.4* 3.5 - 5.1 mmol/L Final  . Chloride 03/28/2018 106  98 - 111 mmol/L Final  . CO2 03/28/2018 24  22 - 32 mmol/L Final  . Glucose, Bld 03/28/2018 229* 70 - 99 mg/dL Final  . BUN 03/28/2018 15  6 - 20 mg/dL Final  . Creatinine, Ser 03/28/2018 0.85  0.44 - 1.00 mg/dL Final  . Calcium 03/28/2018 8.8* 8.9 - 10.3 mg/dL Final  . GFR calc non Af Amer 03/28/2018 >60  >60 mL/min Final  . GFR calc Af Amer 03/28/2018 >60  >60 mL/min Final  . Anion gap 03/28/2018 9  5 - 15 Final   Performed at [facility], [address], [place], [place] [address]  . WBC 03/28/2018 8.8  4.0 - 10.5 K/uL Final  . RBC 03/28/2018 4.30  3.87 - 5.11 MIL/uL Final  . Hemoglobin 03/28/2018 13.0  12.0 - 15.0 g/dL Final  . HCT 03/28/2018 39.7  36.0 - 46.0 % Final  . MCV 03/28/2018 92.3  80.0 - 100.0 fL Final  . MCH 03/28/2018 30.2  26.0 - 34.0 pg Final  . MCHC 03/28/2018 32.7  30.0 - 36.0 g/dL Final  . RDW 03/28/2018 15.3  11.5 - 15.5 % Final  . Platelets 03/28/2018 201  150 - 400 K/uL Final  . nRBC 03/28/2018 0.0  0.0 - 0.2 % Final   Performed at [facility], [address] [place], [place] [address]   Assessment  The primary encounter diagnosis was Occipital headache (Bilateral). Diagnoses of  Chronic musculoskeletal pain, Chronic pain syndrome, Neurogenic pain, and Disease related peripheral neuropathy were also pertinent to this visit.  Plan of Care  I have changed Marie Clarke's rizatriptan. I am also having her maintain her atorvastatin, levothyroxine, montelukast, aspirin EC,  azelastine, Klor-Con M20, acetaminophen, bumetanide, promethazine, metFORMIN, Trelegy Ellipta, omega-3 fish oil, Nicotrol, Myrbetriq, accu-chek safe-t pro, diphenoxylate-atropine, busPIRone, ondansetron, meloxicam, Magnesium Oxide, traMADol, nortriptyline, omeprazole, aspirin EC, naproxen, Ventolin HFA, Accu-Chek Aviva Plus, naproxen, hydrOXYzine, lamoTRIgine, QUEtiapine, methocarbamol, and gabapentin.  Pharmacotherapy (Medications Ordered): Meds ordered this encounter  Medications  . methocarbamol (ROBAXIN) 750 MG tablet    Sig: Take 1 tablet (750 mg total) by mouth every 8 (eight) hours as needed for muscle spasms.    Dispense:  90 tablet    Refill:  2    Do not place this medication, or any other prescription from our practice, on "Automatic Refill". Patient may have prescription filled one day early if pharmacy is closed on scheduled refill date.    Order Specific Question:   Supervising Provider    Answer:   Milinda Pointer [phone]  . gabapentin (NEURONTIN) 600 MG tablet    Sig: Take 2 tablets (1,200 mg total) by mouth 3 (three) times daily.    Dispense:  180 tablet    Refill:  2    Order Specific Question:   Supervising Provider    Answer:   Milinda Pointer [phone]  . rizatriptan (MAXALT) 10 MG tablet    Sig: Take 1 tablet (10 mg total) by mouth as needed.    Dispense:  10 tablet    Refill:  2    Order Specific Question:   Supervising Provider    Answer:   Milinda Pointer [phone]   Orders:  No orders of the defined types were placed in this encounter.  Follow-up plan:   Return in about 2 months (around 08/03/2018) for MedMgmt. While on the phone the patient decided to stop the Nortriptyline.  She states that she will continue to take her Tramadol and [employer]. She has Maxalt for migraines and would like to have this refilled because this is prn. She will talk with her pharmacy on the interaction with the Tramadol and Nortriptyline .   I discussed the assessment and  treatment plan with the patient. The patient was provided an opportunity to ask questions and all were answered. The patient agreed with the plan and demonstrated an understanding of the instructions.  Patient advised to call back or seek an in-person evaluation if the symptoms or condition worsens.  Total duration of non-face-to-face encounter: 11 minutes.  Note by: Dionisio David, NP Date: 06/03/2018; Time: 8:56 AM  Disclaimer:  * Given the special circumstances of the COVID-19 pandemic, the federal government has announced that the Office for Civil Rights (OCR) will exercise its enforcement discretion and will not impose penalties on physicians using telehealth in the event of noncompliance with regulatory requirements under the [place] (HIPAA) in connection with the good faith provision of telehealth during the FFMBW-46 national public health emergency. ([place])

## 2018-06-06 DIAGNOSIS — T8130XS Disruption of wound, unspecified, sequela: Secondary | ICD-10-CM | POA: Diagnosis not present

## 2018-06-06 DIAGNOSIS — R109 Unspecified abdominal pain: Secondary | ICD-10-CM | POA: Diagnosis not present

## 2018-06-08 ENCOUNTER — Other Ambulatory Visit: Payer: Self-pay

## 2018-06-08 ENCOUNTER — Emergency Department: Payer: Medicare HMO

## 2018-06-08 ENCOUNTER — Emergency Department
Admission: EM | Admit: 2018-06-08 | Discharge: 2018-06-08 | Disposition: A | Discharge status: Home / Self Care | Payer: Medicare HMO | Attending: Emergency Medicine | Admitting: Emergency Medicine

## 2018-06-08 ENCOUNTER — Encounter: Payer: Self-pay | Admitting: Emergency Medicine

## 2018-06-08 DIAGNOSIS — E039 Hypothyroidism, unspecified: Secondary | ICD-10-CM | POA: Diagnosis not present

## 2018-06-08 DIAGNOSIS — Z7982 Long term (current) use of aspirin: Secondary | ICD-10-CM | POA: Insufficient documentation

## 2018-06-08 DIAGNOSIS — J168 Pneumonia due to other specified infectious organisms: Secondary | ICD-10-CM | POA: Diagnosis not present

## 2018-06-08 DIAGNOSIS — R000 Tachycardia, unspecified: Secondary | ICD-10-CM | POA: Diagnosis not present

## 2018-06-08 DIAGNOSIS — F17210 Nicotine dependence, cigarettes, uncomplicated: Secondary | ICD-10-CM | POA: Diagnosis not present

## 2018-06-08 DIAGNOSIS — E1122 Type 2 diabetes mellitus with diabetic chronic kidney disease: Secondary | ICD-10-CM | POA: Diagnosis not present

## 2018-06-08 DIAGNOSIS — I259 Chronic ischemic heart disease, unspecified: Secondary | ICD-10-CM | POA: Insufficient documentation

## 2018-06-08 DIAGNOSIS — Z79899 Other long term (current) drug therapy: Secondary | ICD-10-CM | POA: Diagnosis not present

## 2018-06-08 DIAGNOSIS — J189 Pneumonia, unspecified organism: Secondary | ICD-10-CM | POA: Diagnosis not present

## 2018-06-08 DIAGNOSIS — R0602 Shortness of breath: Secondary | ICD-10-CM | POA: Diagnosis not present

## 2018-06-08 DIAGNOSIS — N183 Chronic kidney disease, stage 3 (moderate): Secondary | ICD-10-CM | POA: Insufficient documentation

## 2018-06-08 DIAGNOSIS — I130 Hypertensive heart and chronic kidney disease with heart failure and stage 1 through stage 4 chronic kidney disease, or unspecified chronic kidney disease: Secondary | ICD-10-CM | POA: Diagnosis not present

## 2018-06-08 DIAGNOSIS — I509 Heart failure, unspecified: Secondary | ICD-10-CM | POA: Insufficient documentation

## 2018-06-08 DIAGNOSIS — R600 Localized edema: Secondary | ICD-10-CM | POA: Insufficient documentation

## 2018-06-08 DIAGNOSIS — Z7984 Long term (current) use of oral hypoglycemic drugs: Secondary | ICD-10-CM | POA: Insufficient documentation

## 2018-06-08 DIAGNOSIS — J449 Chronic obstructive pulmonary disease, unspecified: Secondary | ICD-10-CM | POA: Insufficient documentation

## 2018-06-08 DIAGNOSIS — R609 Edema, unspecified: Secondary | ICD-10-CM

## 2018-06-08 LAB — CBC WITH DIFFERENTIAL/PLATELET
Abs Immature Granulocytes: 0.05 10*3/uL (ref 0.00–0.07)
Basophils Absolute: 0 10*3/uL (ref 0.0–0.1)
Basophils Relative: 0 %
Eosinophils Absolute: 0.2 10*3/uL (ref 0.0–0.5)
Eosinophils Relative: 2 %
HCT: 36.3 % (ref 36.0–46.0)
Hemoglobin: 11.4 g/dL — ABNORMAL LOW (ref 12.0–15.0)
Immature Granulocytes: 1 %
Lymphocytes Relative: 22 %
Lymphs Abs: 2.4 10*3/uL (ref 0.7–4.0)
MCH: 31.6 pg (ref 26.0–34.0)
MCHC: 31.4 g/dL (ref 30.0–36.0)
MCV: 100.6 fL — ABNORMAL HIGH (ref 80.0–100.0)
Monocytes Absolute: 0.5 10*3/uL (ref 0.1–1.0)
Monocytes Relative: 4 %
Neutro Abs: 7.8 10*3/uL — ABNORMAL HIGH (ref 1.7–7.7)
Neutrophils Relative %: 71 %
Platelets: 182 10*3/uL (ref 150–400)
RBC: 3.61 MIL/uL — ABNORMAL LOW (ref 3.87–5.11)
RDW: 15.8 % — ABNORMAL HIGH (ref 11.5–15.5)
WBC: 10.9 10*3/uL — ABNORMAL HIGH (ref 4.0–10.5)
nRBC: 0.2 % (ref 0.0–0.2)

## 2018-06-08 LAB — BRAIN NATRIURETIC PEPTIDE: B Natriuretic Peptide: 346 pg/mL — ABNORMAL HIGH (ref 0.0–100.0)

## 2018-06-08 LAB — BASIC METABOLIC PANEL
Anion gap: 9 (ref 5–15)
BUN: 9 mg/dL (ref 6–20)
CO2: 24 mmol/L (ref 22–32)
Calcium: 8.5 mg/dL — ABNORMAL LOW (ref 8.9–10.3)
Chloride: 105 mmol/L (ref 98–111)
Creatinine, Ser: 0.73 mg/dL (ref 0.44–1.00)
GFR calc Af Amer: >60 mL/min (ref >60)
GFR calc non Af Amer: >60 mL/min (ref >60)
Glucose, Bld: 117 mg/dL — ABNORMAL HIGH (ref 70–99)
Potassium: 4.1 mmol/L (ref 3.5–5.1)
Sodium: 138 mmol/L (ref 135–145)

## 2018-06-08 LAB — TROPONIN I: Troponin I: <0.03 ng/mL (ref <0.03)

## 2018-06-08 MED ORDER — AZITHROMYCIN 250 MG PO TABS: ORAL_TABLET | ORAL | 0 refills | Status: DC

## 2018-06-08 MED ORDER — FUROSEMIDE 10 MG/ML IJ SOLN
60.0000 mg | Freq: Once | INTRAMUSCULAR | Status: AC
  Administered 2018-06-08: 21:00:00 60 mg via INTRAVENOUS
  Filled 2018-06-08: qty 8

## 2018-06-08 MED ORDER — DOXYCYCLINE HYCLATE 100 MG PO CAPS: 100.0000 mg | ORAL_CAPSULE | Freq: Two times a day (BID) | ORAL | 0 refills | Status: AC

## 2018-06-08 NOTE — ED Provider Notes (Signed)
[facility] Emergency Department Provider Note   ____________________________________________   I have reviewed the triage vital signs and the nursing notes.   HISTORY  Chief Complaint Leg Swelling   History limited by: Not Limited   HPI Marie Clarke is a 48 y.o. female who presents to the emergency department today because of concerns primarily for leg swelling but also upper extremity swelling.  Patient states she has a history of heart failure.  She states that she has had a recent 20 pound weight gain.  Patient has noticed some discomfort in her legs with the swelling.  She states she has been taking her medicine as prescribed no recent changes in dosage to her medication. She states she has had some associated shortness of breath but denies any chest pain. Denies any fevers.    Records reviewed. Per medical record review patient has a history of CAD, CHF, COPD  Past Medical History:  Diagnosis Date  . Anemia   . Anxiety   . Asthma   . Bipolar disorder ([place])   . CAD (coronary artery disease) unk  . CHF (congestive heart failure) ([place])   . COPD (chronic obstructive pulmonary disease) ([place])   . Depression unk  . Diabetes mellitus without complication ([place])   . Diabetes mellitus, type II ([place])   . Drug overdose   . GERD (gastroesophageal reflux disease)   . Headache   . Hyperlipidemia   . Hypertension   . Left leg pain 04/29/2014  . MI (myocardial infarction) ([place])   . Muscle ache 09/16/2014  . Osteoporosis   . Overactive bladder   . Pancreatitis unk  . Reflex sympathetic dystrophy   . Renal insufficiency   . Sleep apnea    pt reported on 2/6/7 she currently is not using CPAP  . Sleep apnea   . Stroke ([place])   . Thyroid disease   . TIA (transient ischemic attack) unk  . TIA (transient ischemic attack)     Patient Active Problem List   Diagnosis Date Noted  . DDD (degenerative disc disease), lumbosacral 04/22/2018  . Acute postoperative pain  03/13/2018  . Disease related peripheral neuropathy 11/13/2017  . Gastroesophageal reflux disease without esophagitis 11/13/2017  . Occipital headache (Bilateral) 11/13/2017  . Cervicogenic headache (Bilateral) (L>R) 11/13/2017  . History of postoperative nausea 10/22/2017  . Chronic shoulder pain (Fifth Area of Pain) (Bilateral) (L>R) 10/09/2017  . CRPS (complex regional pain syndrome) type 1 of lower limb (left ankle) 10/09/2017  . Ankle joint instability (Left) 10/09/2017  . Ankle sprain, sequela (Left) 10/09/2017  . History of psychiatric symptoms 10/09/2017  . Chronic pain syndrome 10/02/2017  . Spondylosis without myelopathy or radiculopathy, lumbosacral region 10/02/2017  . Chronic musculoskeletal pain 10/02/2017  . Elevated C-reactive protein (CRP) 09/10/2017  . Elevated sed rate 09/10/2017  . Chronic neck pain (Fourth Area of Pain) (Bilateral) (L>R) 09/09/2017  . Pharmacologic therapy 09/09/2017  . Disorder of skeletal system 09/09/2017  . Problems influencing health status 09/09/2017  . Long term current use of opiate analgesic 09/09/2017  . Tobacco use disorder 07/16/2017  . Ventral hernia without obstruction or gangrene 06/25/2017  . Strain of extensor muscle, fascia and tendon of left index finger at wrist and hand level, initial encounter 05/16/2017  . Sepsis ([place]) 04/14/2017  . Osteopenia 04/03/2017  . Hypotension 09/17/2016  . Contusion of knee (Left) 10/12/2015  . Strain of knee (Left) 10/12/2015  . Incidental lung nodule 04/28/2015  . Chronic pain 03/28/2015  .  Chronic low back pain (Primary Area of Pain) (Bilateral) (L>R) 03/28/2015  . Chronic lower extremity pain (Referred) (Secondary Area of Pain) (Left) 03/28/2015  . Abdominal wound dehiscence 03/28/2015  . Encounter for pain management planning 03/28/2015  . Morbid obesity ([place]) 03/28/2015  . Abnormal CT scan, lumbar spine 03/28/2015  . Lumbar facet hypertrophy 03/28/2015  . Lumbar facet syndrome  (Bilateral) (L>R) 03/28/2015  . Lumbar foraminal stenosis (Bilateral) (L5-S1) 03/28/2015  . Chronic ankle pain [facility] Area of Pain) (Left) 03/28/2015  . Neurogenic pain 03/28/2015  . Neuropathic pain 03/28/2015  . Myofascial pain 03/28/2015  . History of suicide attempt 03/28/2015  . 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 Abnormal gait 12/15/2014  . Congestive heart failure ([place]) 11/15/2014  . Abdominal wall abscess 09/20/2014  . 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 Diabetes mellitus, type 2 ([place]) 08/13/2014  . Bipolar affective disorder ([place]) 08/13/2014  . Type 2 diabetes mellitus ([place]) 08/13/2014  . Rectal prolapse 08/09/2014  . Rectal bleeding 08/09/2014  . Rectal bleed 08/09/2014  . Affective bipolar disorder ([place]) 08/05/2014  . Arteriosclerosis of coronary artery 08/05/2014  . CCF (congestive cardiac failure) ([place]) 08/05/2014  . Chronic kidney disease 08/05/2014  . 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 Apnea, sleep 08/05/2014  . Temporary cerebral vascular dysfunction 08/05/2014  . Polypharmacy 04/29/2014  . Other long term (current) drug therapy 04/29/2014  . 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 Chronic kidney disease, stage III (moderate) ([place]) 12/14/2013  . Controlled diabetes mellitus type II without complication ([place]) 73/22/0254  . Essential (primary) hypertension 12/03/2013  . Adult hypothyroidism 12/03/2013  . Controlled type 2 diabetes mellitus without complication ([place]) 27/07/2374    Past Surgical History:  Procedure Laterality Date  . ABDOMINAL HYSTERECTOMY    . HERNIA REPAIR  07/15/2017  . prolapse rectum surgery N/A July 2016  . TONSILLECTOMY      Prior to Admission medications   Medication Sig Start Date End Date Taking? Authorizing Provider  ACCU-CHEK AVIVA PLUS test strip  05/03/18   [provider]  acetaminophen (TYLENOL) 325 MG tablet Take 1 tablet (325 mg total) by mouth every 6 (six) hours as needed for mild pain (or Fever >/= 101).  09/18/16   Gouru, Aruna, MD  aspirin EC 81 MG tablet Take 81 mg by mouth daily at 12 noon.    [provider]  aspirin EC 81 MG tablet Take by mouth. 04/23/18 04/23/19  [provider]  atorvastatin (LIPITOR) 40 MG tablet Take 40 mg by mouth at bedtime.     [provider]  azelastine (ASTELIN) 0.1 % nasal spray Place 1 spray into both nostrils daily.    [provider]  bumetanide (BUMEX) 2 MG tablet Take 1 tablet (2 mg total) by mouth 2 (two) times daily. 09/20/16 12/05/17  Nicholes Mango, MD  busPIRone (BUSPAR) 10 MG tablet Take 2 tablets (20 mg total) by mouth 3 (three) times daily. 01/01/18   Ursula Alert, MD  diphenoxylate-atropine (LOMOTIL) 2.5-0.025 MG tablet Take by mouth. 12/25/17 11/29/18  [provider]  gabapentin (NEURONTIN) 600 MG tablet Take 2 tablets (1,200 mg total) by mouth 3 (three) times daily. 06/03/18 09/01/18  Vevelyn Francois, NP  KLOR-CON M20 20 MEQ tablet Take 20 mEq by mouth daily.  10/13/15   [provider]  lamoTRIgine (LAMICTAL) 100 MG tablet Take 1 tablet (100 mg total) by mouth daily. 05/20/18   Ursula Alert, MD  Lancets (ACCU-CHEK SAFE-T PRO) lancets  09/30/17   [provider]  levothyroxine (  SYNTHROID, LEVOTHROID) 200 MCG tablet Take 200 mcg by mouth daily.     [provider]  Magnesium Oxide 500 MG CAPS Take 1 capsule (500 mg total) by mouth 2 (two) times daily at 8 am and 10 pm. 05/05/18 11/01/18  Vevelyn Francois, NP  meloxicam (MOBIC) 7.5 MG tablet Take 7.5 mg by mouth daily.    [provider]  metFORMIN (GLUCOPHAGE) 500 MG tablet  08/26/17   [provider]  methocarbamol (ROBAXIN) 750 MG tablet Take 1 tablet (750 mg total) by mouth every 8 (eight) hours as needed for muscle spasms. 07/21/18 10/19/18  Vevelyn Francois, NP  montelukast (SINGULAIR) 10 MG tablet Take 10 mg by mouth daily.     [provider]  MYRBETRIQ 50 MG TB24 tablet  12/19/17   [provider]   naproxen (EC NAPROSYN) 500 MG EC tablet Take by mouth. 05/13/18 05/13/19  [provider]  naproxen (NAPROSYN) 500 MG tablet  05/13/18   [provider]  NICOTROL 10 MG inhaler  11/11/17   [provider]  nortriptyline (PAMELOR) 10 MG capsule Take by mouth. 05/13/18 08/11/18  [provider]  omega-3 fish oil (MAXEPA) 1000 MG CAPS capsule  08/15/17   [provider]  omeprazole (PRILOSEC) 20 MG capsule  04/28/18   [provider]  ondansetron (ZOFRAN-ODT) 8 MG disintegrating tablet Take 8 mg by mouth as needed. 01/21/18   [provider]  promethazine (PHENERGAN) 25 MG tablet Take 1 tablet (25 mg total) by mouth every 6 (six) hours as needed for nausea or vomiting. 08/01/17   Earleen Newport, MD  propranolol (INDERAL) 10 MG tablet Take 1 tablet (10 mg total) by mouth 3 (three) times daily as needed. For severe anxiety symptoms 06/03/18   Ursula Alert, MD  QUEtiapine (SEROQUEL) 100 MG tablet Take 1 tablet (100 mg total) by mouth at bedtime. For mood 06/03/18   Ursula Alert, MD  rizatriptan (MAXALT) 10 MG tablet Take 1 tablet (10 mg total) by mouth as needed. 06/03/18   Vevelyn Francois, NP  traMADol (ULTRAM) 50 MG tablet Take 1 tablet (50 mg total) by mouth every 6 (six) hours as needed for severe pain. 05/05/18 08/03/18  Vevelyn Francois, NP  TRELEGY ELLIPTA 100-62.5-25 MCG/INH AEPB  08/19/17   [provider]  VENTOLIN HFA 108 [phone] Base) MCG/ACT inhaler  05/03/18   [provider]    Allergies Diazepam; Levofloxacin; Metronidazole; Pregabalin; Sulfa antibiotics; Cephalexin; Ciprofloxacin; Ziprasidone hcl; and Penicillins  Family History  Problem Relation Age of Onset  . Diabetes Mellitus II Mother   . CAD Mother   . Sleep apnea Mother   . Osteoarthritis Mother   . Osteoporosis Mother   . Anxiety disorder Mother   . Depression Mother   . Bipolar disorder Mother   . Bipolar disorder Father   . Hypertension Father    . Depression Father   . Anxiety disorder Father   . Post-traumatic stress disorder Sister     Social History Social History   Tobacco Use  . Smoking status: Current Every Day Smoker    Packs/day: 0.50    Types: Cigarettes  . Smokeless tobacco: Never Used  Substance Use Topics  . Alcohol use: No    Alcohol/week: 0.0 standard drinks  . Drug use: No    Review of Systems Constitutional: No fever/chills. Positive for weight gain.  Eyes: No visual changes. ENT: No sore throat. Cardiovascular: Denies chest pain. Respiratory: Positive for  shortness of breath. Gastrointestinal: No abdominal pain.  No nausea, no vomiting.  No diarrhea.   Genitourinary: Negative for dysuria. Musculoskeletal: Positive for leg and arm swelling.  Skin: Negative for rash. Neurological: Negative for headaches, focal weakness or numbness.  ____________________________________________   PHYSICAL EXAM:  VITAL SIGNS: ED Triage Vitals  Enc Vitals Group     BP 06/08/18 1801 (!) 148/82     Pulse Rate 06/08/18 1801 (!) 106     Resp 06/08/18 1801 18     Temp 06/08/18 1801 98.9 F (37.2 C)     Temp src --      SpO2 06/08/18 1801 94 %     Weight 06/08/18 1802 237 lb (107.5 kg)     Height 06/08/18 1802 5\' 4"  (1.626 m)     Head Circumference --      Peak Flow --      Pain Score 06/08/18 1802 0   Constitutional: Alert and oriented.  Eyes: Conjunctivae are normal.  ENT      Head: Normocephalic and atraumatic.      Nose: No congestion/rhinnorhea.      Mouth/Throat: Mucous membranes are moist.      Neck: No stridor. Hematological/Lymphatic/Immunilogical: No cervical lymphadenopathy. Cardiovascular: Normal rate, regular rhythm.  No murmurs, rubs, or gallops.  Respiratory: Normal respiratory effort without tachypnea nor retractions. Diffuse mild expiratory wheezing Gastrointestinal: Soft and non tender. No rebound. No guarding.  Genitourinary: Deferred Musculoskeletal: Normal range of motion in all  extremities. 2+ bilateral lower extremity edema.  Neurologic:  Normal speech and language. No gross focal neurologic deficits are appreciated.  Skin:  Skin is warm, dry and intact. No rash noted. Psychiatric: Mood and affect are normal. Speech and behavior are normal. Patient exhibits appropriate insight and judgment.  ____________________________________________    LABS (pertinent positives/negatives)  CBC wbc 10.9, hgb 11.4, plt 182 BNP 346 Trop <0.03 BMP wnl except glu 117, ca 8.5 ____________________________________________   EKG  I, Nance Pear, attending physician, personally viewed and interpreted this EKG  EKG Time: 1815 Rate: 102 Rhythm: sinus tachycardia Axis: normal Intervals: qtc 452 QRS: narrow ST changes: no st elevation Impression: abnormal ekg ____________________________________________    RADIOLOGY  CXR Possible interstitial inflammation vs pneumonia  ____________________________________________   PROCEDURES  Procedures  ____________________________________________   INITIAL IMPRESSION / ASSESSMENT AND PLAN / ED COURSE  Pertinent labs & imaging results that were available during my care of the patient were reviewed by me and considered in my medical decision making (see chart for details).   Patient presented to the emergency department today with concerns for peripheral edema as well as some shortness of breath.  On exam patient did have 2+ pitting edema to bilateral lower extremities.  Patient's x-ray is concerning for possible atypical pneumonia.  This could explain the patient's shortness of breath.  Patient was given dose of Lasix for peripheral edema.  Do think is this point patient without any significant respiratory distress requiring admission.  No indication the patient is septic.  Will discharge home to follow-up with primary care.  ____________________________________________   FINAL CLINICAL IMPRESSION(S) / ED  DIAGNOSES  Final diagnoses:  Peripheral edema  Pneumonia due to infectious organism, unspecified laterality, unspecified part of lung     Note: This dictation was prepared with Dragon dictation. Any transcriptional errors that result from this process are unintentional     Nance Pear, MD 06/08/18 2126

## 2018-06-08 NOTE — Discharge Instructions (Addendum)
Please seek medical attention for any high fevers, chest pain, shortness of breath, change in behavior, persistent vomiting, bloody stool or any other new or concerning symptoms.  

## 2018-06-08 NOTE — ED Notes (Signed)
Pt verbalized understanding of d/c instructions, RX, and f/u care. No further questions at this time. Pt ambulatory to the exit with steady gait  

## 2018-06-08 NOTE — ED Triage Notes (Signed)
Pt to ER with c/o bilateral leg swelling.  Pt with noticeable edema to bilateral lower legs.  Pt also reports swelling to bilateral arms.  Pt states hx of CHF and feels like this is the cause.  Pt reports mild SHOB with walking.

## 2018-06-09 DIAGNOSIS — R011 Cardiac murmur, unspecified: Secondary | ICD-10-CM | POA: Diagnosis not present

## 2018-06-09 DIAGNOSIS — I509 Heart failure, unspecified: Secondary | ICD-10-CM | POA: Diagnosis not present

## 2018-06-09 DIAGNOSIS — N182 Chronic kidney disease, stage 2 (mild): Secondary | ICD-10-CM | POA: Diagnosis not present

## 2018-06-09 DIAGNOSIS — G4733 Obstructive sleep apnea (adult) (pediatric): Secondary | ICD-10-CM | POA: Diagnosis not present

## 2018-06-09 DIAGNOSIS — R600 Localized edema: Secondary | ICD-10-CM | POA: Diagnosis not present

## 2018-06-09 DIAGNOSIS — R000 Tachycardia, unspecified: Secondary | ICD-10-CM | POA: Diagnosis not present

## 2018-06-09 DIAGNOSIS — F17200 Nicotine dependence, unspecified, uncomplicated: Secondary | ICD-10-CM | POA: Diagnosis not present

## 2018-06-09 DIAGNOSIS — I208 Other forms of angina pectoris: Secondary | ICD-10-CM | POA: Diagnosis not present

## 2018-06-09 DIAGNOSIS — R0602 Shortness of breath: Secondary | ICD-10-CM | POA: Diagnosis not present

## 2018-06-12 ENCOUNTER — Other Ambulatory Visit: Payer: Self-pay

## 2018-06-12 ENCOUNTER — Encounter: Payer: Self-pay | Admitting: Psychiatry

## 2018-06-12 ENCOUNTER — Ambulatory Visit (INDEPENDENT_AMBULATORY_CARE_PROVIDER_SITE_OTHER): Payer: Medicare HMO | Admitting: Psychiatry

## 2018-06-12 DIAGNOSIS — F410 Panic disorder [episodic paroxysmal anxiety] without agoraphobia: Secondary | ICD-10-CM | POA: Diagnosis not present

## 2018-06-12 DIAGNOSIS — F4310 Post-traumatic stress disorder, unspecified: Secondary | ICD-10-CM

## 2018-06-12 DIAGNOSIS — F17200 Nicotine dependence, unspecified, uncomplicated: Secondary | ICD-10-CM | POA: Diagnosis not present

## 2018-06-12 DIAGNOSIS — F3160 Bipolar disorder, current episode mixed, unspecified: Secondary | ICD-10-CM | POA: Diagnosis not present

## 2018-06-12 NOTE — Progress Notes (Signed)
Virtual Visit via Video Note  I connected with Marie Clarke on 06/12/18 at  1:20 PM EDT by a video enabled telemedicine application and verified that I am speaking with the correct person using two identifiers.   I discussed the limitations of evaluation and management by telemedicine and the availability of in person appointments. The patient expressed understanding and agreed to proceed.   I discussed the assessment and treatment plan with the patient. The patient was provided an opportunity to ask questions and all were answered. The patient agreed with the plan and demonstrated an understanding of the instructions.   The patient was advised to call back or seek an in-person evaluation if the symptoms worsen or if the condition fails to improve as anticipated.    [place] MD OP Progress Note  06/12/2018 5:56 PM Marie Clarke  MRN:  [id number]  Chief Complaint:  Chief Complaint    Follow-up     HPI: Marie Clarke is a 48 year old Caucasian female, single, on SSD, lives in [place], has a history of bipolar disorder, COPD, OSA on CPAP, coronary artery disease, hyperlipidemia, hypertension, hypothyroidism, back pain, was evaluated by telemedicine today.  Patient today reports that she is currently doing better with her anxiety symptoms.  She was recently started on propranolol.  Her medications were readjusted 10 days ago.  Patient reports so far she has been making progress.  She denies any side effects to the new medication.  Patient reports sleep is good.  Patient denies any suicidality, homicidality or perceptual disturbances.  Patient appeared to be alert, oriented to person place and situation.  Patient however reports she has a lot of health issues going on.  She was recently seen in the emergency department for shortness of breath and edema and was diagnosed with atypical pneumonia.  Patient reports she is currently on treatment for the same.  She reports she also has a CT scan scheduled for her  abdomen.  Patient reports she is trying to cope with her current stressors as best as she can.  Continues to smoke cigarettes and reports she is not ready to quit.    Visit Diagnosis:    ICD-10-CM   1. Bipolar I disorder, most recent episode mixed ([place]) F31.60   2. PTSD (post-traumatic stress disorder) F43.10   3. Panic attacks F41.0   4. Tobacco use disorder F17.200     Past Psychiatric History: Have reviewed past psychiatric history from my progress note on 03/06/2017.  Past Medical History:  Past Medical History:  Diagnosis Date  . Anemia   . Anxiety   . Asthma   . Bipolar disorder ([place])   . CAD (coronary artery disease) unk  . CHF (congestive heart failure) ([place])   . COPD (chronic obstructive pulmonary disease) ([place])   . Depression unk  . Diabetes mellitus without complication ([place])   . Diabetes mellitus, type II ([place])   . Drug overdose   . GERD (gastroesophageal reflux disease)   . Headache   . Hyperlipidemia   . Hypertension   . Left leg pain 04/29/2014  . MI (myocardial infarction) ([place])   . Muscle ache 09/16/2014  . Osteoporosis   . Overactive bladder   . Pancreatitis unk  . Reflex sympathetic dystrophy   . Renal insufficiency   . Sleep apnea    pt reported on 2/6/7 she currently is not using CPAP  . Sleep apnea   . Stroke ([place])   . Thyroid disease   . TIA (transient ischemic attack) unk  .  TIA (transient ischemic attack)     Past Surgical History:  Procedure Laterality Date  . ABDOMINAL HYSTERECTOMY    . HERNIA REPAIR  07/15/2017  . prolapse rectum surgery N/A July 2016  . TONSILLECTOMY      Family Psychiatric History: Reviewed family psychiatric history from my progress note on 03/06/2017 Family History:  Family History  Problem Relation Age of Onset  . Diabetes Mellitus II Mother   . CAD Mother   . Sleep apnea Mother   . Osteoarthritis Mother   . Osteoporosis Mother   . Anxiety disorder Mother   . Depression Mother   . Bipolar disorder  Mother   . Bipolar disorder Father   . Hypertension Father   . Depression Father   . Anxiety disorder Father   . Post-traumatic stress disorder Sister     Social History: Reviewed social history from my progress note on 03/06/2017. Social History   Socioeconomic History  . Marital status: Single    Spouse name: Not on file  . Number of children: 0  . Years of education: Not on file  . Highest education level: High school graduate  Occupational History    Comment: not employed  Social Needs  . Financial resource strain: Not hard at all  . Food insecurity:    Worry: Never true    Inability: Never true  . Transportation needs:    Medical: Yes    Non-medical: Yes  Tobacco Use  . Smoking status: Current Every Day Smoker    Packs/day: 0.50    Types: Cigarettes  . Smokeless tobacco: Never Used  Substance and Sexual Activity  . Alcohol use: No    Alcohol/week: 0.0 standard drinks  . Drug use: No  . Sexual activity: Not Currently  Lifestyle  . Physical activity:    Days per week: 0 days    Minutes per session: 0 min  . Stress: Not at all  Relationships  . Social connections:    Talks on phone: More than three times a week    Gets together: More than three times a week    Attends religious service: More than 4 times per year    Active member of club or organization: No    Attends meetings of clubs or organizations: Never    Relationship status: Never married  Other Topics Concern  . Not on file  Social History Narrative  . Not on file    Allergies:  Allergies  Allergen Reactions  . Diazepam Hives and Nausea And Vomiting  . Levofloxacin Hives and Rash    1/31: would like to retry since not taking valium  . Metronidazole Hives    1/31: would like to retry since she is no longer taking valium  . Pregabalin Hives and Rash    1/31 currently taking lyrica without reaction since no longer taking valium  . Sulfa Antibiotics Hives  . Cephalexin Hives  . Ciprofloxacin  Hives  . Ziprasidone Hcl Other (See Comments)    CONFUSED CONFUSED Other reaction(s): Other (See Comments) CONFUSED CONFUSED   . Penicillins Nausea And Vomiting    Has patient had a PCN reaction causing immediate rash, facial/tongue/throat swelling, SOB or lightheadedness with hypotension: No Has patient had a PCN reaction causing severe rash involving mucus membranes or skin necrosis: No Has patient had a PCN reaction that required hospitalization: No Has patient had a PCN reaction occurring within the last 10 years: No If all of the above answers are "NO", then may  proceed with Cephalosporin use.     Metabolic Disorder Labs: No results found for: HGBA1C, MPG No results found for: PROLACTIN No results found for: CHOL, TRIG, HDL, CHOLHDL, VLDL, LDLCALC No results found for: TSH  Therapeutic Level Labs: No results found for: LITHIUM No results found for: VALPROATE No components found for:  CBMZ  Current Medications: Current Outpatient Medications  Medication Sig Dispense Refill  . ipratropium (ATROVENT) 0.03 % nasal spray Place into the nose.    . levocetirizine (XYZAL) 5 MG tablet     . Magnesium Oxide, Antacid, 500 MG CAPS Take by mouth.    . nitroGLYCERIN (NITROSTAT) 0.4 MG SL tablet Place under the tongue.    Marland Kitchen omega-3 acid ethyl esters (LOVAZA) 1 g capsule     . prochlorperazine (COMPAZINE) 10 MG tablet     . torsemide (DEMADEX) 20 MG tablet Take by mouth.    Marland Kitchen ACCU-CHEK AVIVA PLUS test strip     . acetaminophen (TYLENOL) 325 MG tablet Take 1 tablet (325 mg total) by mouth every 6 (six) hours as needed for mild pain (or Fever >/= 101).    Marland Kitchen aspirin EC 81 MG tablet Take 81 mg by mouth daily at 12 noon.    Marland Kitchen aspirin EC 81 MG tablet Take by mouth.    Marland Kitchen atorvastatin (LIPITOR) 40 MG tablet Take 40 mg by mouth at bedtime.     Marland Kitchen azelastine (ASTELIN) 0.1 % nasal spray Place 1 spray into both nostrils daily.    . bumetanide (BUMEX) 2 MG tablet Take 1 tablet (2 mg total) by  mouth 2 (two) times daily. 60 tablet 11  . busPIRone (BUSPAR) 10 MG tablet Take 2 tablets (20 mg total) by mouth 3 (three) times daily. 540 tablet 1  . dicyclomine (BENTYL) 10 MG capsule     . diphenoxylate-atropine (LOMOTIL) 2.5-0.025 MG tablet Take by mouth.    . doxycycline (VIBRA-TABS) 100 MG tablet     . doxycycline (VIBRAMYCIN) 100 MG capsule Take 1 capsule (100 mg total) by mouth 2 (two) times daily for 7 days. 14 capsule 0  . gabapentin (NEURONTIN) 600 MG tablet Take 2 tablets (1,200 mg total) by mouth 3 (three) times daily. 180 tablet 2  . ibuprofen (ADVIL) 800 MG tablet     . KLOR-CON M20 20 MEQ tablet Take 20 mEq by mouth daily.     Marland Kitchen lamoTRIgine (LAMICTAL) 100 MG tablet Take 1 tablet (100 mg total) by mouth daily. 90 tablet 0  . Lancets (ACCU-CHEK SAFE-T PRO) lancets     . levothyroxine (SYNTHROID, LEVOTHROID) 200 MCG tablet Take 200 mcg by mouth daily.     . Magnesium Oxide 500 MG CAPS Take 1 capsule (500 mg total) by mouth 2 (two) times daily at 8 am and 10 pm. 180 capsule 1  . meloxicam (MOBIC) 7.5 MG tablet Take 7.5 mg by mouth daily.    . metFORMIN (GLUCOPHAGE) 500 MG tablet     . [START ON 07/21/2018] methocarbamol (ROBAXIN) 750 MG tablet Take 1 tablet (750 mg total) by mouth every 8 (eight) hours as needed for muscle spasms. 90 tablet 2  . montelukast (SINGULAIR) 10 MG tablet Take 10 mg by mouth daily.     Marland Kitchen MYRBETRIQ 50 MG TB24 tablet     . naproxen (EC NAPROSYN) 500 MG EC tablet Take by mouth.    . naproxen (NAPROSYN) 500 MG tablet     . NICOTROL 10 MG inhaler     . nortriptyline (PAMELOR)  10 MG capsule Take by mouth.    . omega-3 fish oil (MAXEPA) 1000 MG CAPS capsule     . omeprazole (PRILOSEC) 20 MG capsule     . ondansetron (ZOFRAN-ODT) 8 MG disintegrating tablet Take 8 mg by mouth as needed.    . promethazine (PHENERGAN) 25 MG tablet Take 1 tablet (25 mg total) by mouth every 6 (six) hours as needed for nausea or vomiting. 20 tablet 0  . propranolol (INDERAL) 10 MG  tablet Take 1 tablet (10 mg total) by mouth 3 (three) times daily as needed. For severe anxiety symptoms 90 tablet 1  . QUEtiapine (SEROQUEL) 100 MG tablet Take 1 tablet (100 mg total) by mouth at bedtime. For mood 90 tablet 0  . rizatriptan (MAXALT) 10 MG tablet Take 1 tablet (10 mg total) by mouth as needed. 10 tablet 2  . traMADol (ULTRAM) 50 MG tablet Take 1 tablet (50 mg total) by mouth every 6 (six) hours as needed for severe pain. 120 tablet 2  . TRELEGY ELLIPTA 100-62.5-25 MCG/INH AEPB     . VENTOLIN HFA 108 (90 Base) MCG/ACT inhaler      No current facility-administered medications for this visit.      Musculoskeletal: Strength & Muscle Tone: UTA Gait & Station: normal Patient leans: N/A  Psychiatric Specialty Exam: Review of Systems  Psychiatric/Behavioral: The patient is nervous/anxious.   All other systems reviewed and are negative.   There were no vitals taken for this visit.There is no height or weight on file to calculate BMI.  General Appearance: Casual  Eye Contact:  Fair  Speech:  Clear and Coherent  Volume:  Normal  Mood:  Anxious  Affect:  Appropriate  Thought Process:  Goal Directed and Descriptions of Associations: Intact  Orientation:  Full (Time, Place, and Person)  Thought Content: Logical   Suicidal Thoughts:  No  Homicidal Thoughts:  No  Memory:  Immediate;   Fair Recent;   Fair Remote;   Fair  Judgement:  Fair  Insight:  Fair  Psychomotor Activity:  Normal  Concentration:  Concentration: Fair and Attention Span: Fair  Recall:  [employer] of Knowledge: Fair  Language: Fair  Akathisia:  No  Handed:  Right  AIMS (if indicated): denies tremors, rigidity  Assets:  Communication Skills Des[employer] for [place] Talents/Skills  ADL's:  Intact  Cognition: WNL  Sleep:  Fair   Screenings: PHQ2-9     Procedure visit from 04/22/2018 in [facility] Procedure visit from 03/13/2018 in  [facility] Office Visit from 12/23/2017 in [facility] Procedure visit from 12/05/2017 in [facility] Office Visit from 11/13/2017 in [facility]  PHQ-2 Total Score  0  0  0  0  0       Assessment and Plan: Marie Clarke is a 48 year old Caucasian female who has a history of bipolar disorder, panic attacks, PTSD, tobacco use disorder as well as multiple health problems was evaluated by telemedicine today.  Patient is currently making progress on the current medication regimen.  Plan as noted below.  Plan Bipolar disorder-improving Seroquel 100 mg p.o. daily Lamotrigine 100 mg p.o. daily BuSpar 10 mg p.o. 3 times daily  For PTSD-improving She was referred for CBT previously.  For panic attacks- improving Propranolol 10 mg p.o. 3 times daily as needed  For tobacco use disorder-unstable  Patient is not ready to quit.  Follow-up in clinic in 2 to 3 months or sooner if needed.  I have spent atleast 15 minutes non  face to face with patient today. More than 50 % of the time was spent for psychoeducation and supportive psychotherapy and care coordination.  This note was generated in part or whole with voice recognition software. Voice recognition is usually quite accurate but there are transcription errors that can and very often do occur. I apologize for any typographical errors that were not detected and corrected.        Ursula Alert, MD 06/12/2018, 5:56 PM

## 2018-06-16 ENCOUNTER — Encounter: Payer: Self-pay | Admitting: Pain Medicine

## 2018-06-24 ENCOUNTER — Encounter: Payer: Self-pay | Admitting: Licensed Clinical Social Worker

## 2018-06-24 ENCOUNTER — Other Ambulatory Visit: Payer: Self-pay

## 2018-06-24 ENCOUNTER — Ambulatory Visit (INDEPENDENT_AMBULATORY_CARE_PROVIDER_SITE_OTHER): Payer: Medicare HMO | Admitting: Licensed Clinical Social Worker

## 2018-06-24 DIAGNOSIS — F3160 Bipolar disorder, current episode mixed, unspecified: Secondary | ICD-10-CM | POA: Diagnosis not present

## 2018-06-24 DIAGNOSIS — F419 Anxiety disorder, unspecified: Secondary | ICD-10-CM

## 2018-06-24 DIAGNOSIS — F410 Panic disorder [episodic paroxysmal anxiety] without agoraphobia: Secondary | ICD-10-CM

## 2018-06-24 DIAGNOSIS — F4310 Post-traumatic stress disorder, unspecified: Secondary | ICD-10-CM

## 2018-06-24 DIAGNOSIS — F17200 Nicotine dependence, unspecified, uncomplicated: Secondary | ICD-10-CM | POA: Diagnosis not present
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
## 2018-07-03 ENCOUNTER — Ambulatory Visit: Payer: Medicare HMO | Admitting: Psychiatry

## 2018-07-08 ENCOUNTER — Encounter: Payer: Self-pay | Admitting: Psychiatry

## 2018-07-08 ENCOUNTER — Ambulatory Visit (INDEPENDENT_AMBULATORY_CARE_PROVIDER_SITE_OTHER): Payer: Medicare HMO | Admitting: Psychiatry

## 2018-07-08 ENCOUNTER — Encounter: Payer: Self-pay | Admitting: Pain Medicine

## 2018-07-08 ENCOUNTER — Other Ambulatory Visit: Payer: Self-pay

## 2018-07-08 DIAGNOSIS — F17200 Nicotine dependence, unspecified, uncomplicated: Secondary | ICD-10-CM

## 2018-07-08 DIAGNOSIS — F3160 Bipolar disorder, current episode mixed, unspecified: Secondary | ICD-10-CM

## 2018-07-08 DIAGNOSIS — F410 Panic disorder [episodic paroxysmal anxiety] without agoraphobia: Secondary | ICD-10-CM | POA: Diagnosis not present

## 2018-07-08 DIAGNOSIS — F4310 Post-traumatic stress disorder, unspecified: Secondary | ICD-10-CM

## 2018-07-08 MED ORDER — LAMOTRIGINE 25 MG PO TABS: 25.0000 mg | ORAL_TABLET | Freq: Every day | ORAL | 0 refills | Status: DC

## 2018-07-08 MED ORDER — QUETIAPINE FUMARATE 100 MG PO TABS: 150.0000 mg | ORAL_TABLET | Freq: Every day | ORAL | 0 refills | Status: DC

## 2018-07-08 MED ORDER — BUSPIRONE HCL 10 MG PO TABS: 20.0000 mg | ORAL_TABLET | Freq: Three times a day (TID) | ORAL | 1 refills | Status: DC

## 2018-07-08 NOTE — Progress Notes (Signed)
Virtual Visit via Video Note  I connected with Marie Clarke on 07/08/18 at  1:15 PM EDT by a video enabled telemedicine application and verified that I am speaking with the correct person using two identifiers.   I discussed the limitations of evaluation and management by telemedicine and the availability of in person appointments. The patient expressed understanding and agreed to proceed.   I discussed the assessment and treatment plan with the patient. The patient was provided an opportunity to ask questions and all were answered. The patient agreed with the plan and demonstrated an understanding of the instructions.   The patient was advised to call back or seek an in-person evaluation if the symptoms worsen or if the condition fails to improve as anticipated.  [facility] MD OP Progress Note  07/08/2018 5:51 PM Reylene Stauder  MRN:  [id number]  Chief Complaint:  Chief Complaint    Follow-up     HPI: Marie Clarke is a 48 year old Caucasian female, single, on SSD, lives in [place], has a history of bipolar disorder, PTSD, COPD, OSA on CPAP, coronary artery disease, hyperlipidemia, hypertension, hypothyroidism, back pain, recurrent abdominal pain was evaluated by telemedicine today.  Patient today reports she continues to feel anxious and agitated.  She reports she sometimes have this feeling of itching all over her skin as well as she feels she is crawling out of her skin.  This has been going on since the past 1 week.  She is compliant on her medications as prescribed.  She reports the medications may be helping to some extent however since the past week her symptoms are worsening.  She reports she also has a lot of pain and takes tramadol around-the-clock.  She denies misusing her tramadol and reports she uses less than what is recommended.  Patient reports sleep is fair.  Patient started psychotherapy sessions with Ms. Alden Hipp and has upcoming appointment in a week from now.  Discussed with patient  to keep her appointment and to discuss her anxiety symptoms with her therapist as well.  Patient denies any suicidality, homicidality or perceptual disturbances.  Discussed readjusting her dosage of medications and she agrees with plan. Visit Diagnosis:    ICD-10-CM   1. Bipolar I disorder, most recent episode mixed (HCC) F31.60 QUEtiapine (SEROQUEL) 100 MG tablet    busPIRone (BUSPAR) 10 MG tablet    lamoTRIgine (LAMICTAL) 25 MG tablet  2. PTSD (post-traumatic stress disorder) F43.10   3. Panic attacks F41.0   4. Tobacco use disorder F17.200     Past Psychiatric History: I have reviewed past psychiatric history from my progress note on 03/06/2017. Past Medical History:  Past Medical History:  Diagnosis Date  . Anemia   . Anxiety   . Asthma   . Bipolar disorder ([place])   . CAD (coronary artery disease) unk  . CHF (congestive heart failure) ([place])   . COPD (chronic obstructive pulmonary disease) ([place])   . Depression unk  . Diabetes mellitus without complication ([place])   . Diabetes mellitus, type II ([place])   . Drug overdose   . GERD (gastroesophageal reflux disease)   . Headache   . Hyperlipidemia   . Hypertension   . Left leg pain 04/29/2014  . MI (myocardial infarction) ([place])   . Muscle ache 09/16/2014  . Osteoporosis   . Overactive bladder   . Pancreatitis unk  . Reflex sympathetic dystrophy   . Renal insufficiency   . Sleep apnea    pt reported on 2/6/7 she currently is  not using CPAP  . Sleep apnea   . Stroke ([place])   . Thyroid disease   . TIA (transient ischemic attack) unk  . TIA (transient ischemic attack)     Past Surgical History:  Procedure Laterality Date  . ABDOMINAL HYSTERECTOMY    . HERNIA REPAIR  07/15/2017  . prolapse rectum surgery N/A July 2016  . TONSILLECTOMY      Family Psychiatric History: Reviewed family psychiatric history from my progress note on 03/06/2017.  Family History:  Family History  Problem Relation Age of Onset  . Diabetes Mellitus  II Mother   . CAD Mother   . Sleep apnea Mother   . Osteoarthritis Mother   . Osteoporosis Mother   . Anxiety disorder Mother   . Depression Mother   . Bipolar disorder Mother   . Bipolar disorder Father   . Hypertension Father   . Depression Father   . Anxiety disorder Father   . Post-traumatic stress disorder Sister     Social History: Have reviewed social history from my progress note on 03/06/2017. Social History   Socioeconomic History  . Marital status: Single    Spouse name: Not on file  . Number of children: 0  . Years of education: Not on file  . Highest education level: High school graduate  Occupational History    Comment: not employed  Social Needs  . Financial resource strain: Not hard at all  . Food insecurity:    Worry: Never true    Inability: Never true  . Transportation needs:    Medical: Yes    Non-medical: Yes  Tobacco Use  . Smoking status: Current Every Day Smoker    Packs/day: 0.50    Types: Cigarettes  . Smokeless tobacco: Never Used  Substance and Sexual Activity  . Alcohol use: No    Alcohol/week: 0.0 standard drinks  . Drug use: No  . Sexual activity: Not Currently  Lifestyle  . Physical activity:    Days per week: 0 days    Minutes per session: 0 min  . Stress: Not at all  Relationships  . Social connections:    Talks on phone: More than three times a week    Gets together: More than three times a week    Attends religious service: More than 4 times per year    Active member of club or organization: No    Attends meetings of clubs or organizations: Never    Relationship status: Never married  Other Topics Concern  . Not on file  Social History Narrative  . Not on file    Allergies:  Allergies  Allergen Reactions  . Diazepam Hives and Nausea And Vomiting  . Levofloxacin Hives and Rash    1/31: would like to retry since not taking valium  . Metronidazole Hives    1/31: would like to retry since she is no longer taking  valium  . Pregabalin Hives and Rash    1/31 currently taking lyrica without reaction since no longer taking valium  . Sulfa Antibiotics Hives  . Cephalexin Hives  . Ciprofloxacin Hives  . Ziprasidone Hcl Other (See Comments)    CONFUSED CONFUSED Other reaction(s): Other (See Comments) CONFUSED CONFUSED   . Penicillins Nausea And Vomiting    Has patient had a PCN reaction causing immediate rash, facial/tongue/throat swelling, SOB or lightheadedness with hypotension: No Has patient had a PCN reaction causing severe rash involving mucus membranes or skin necrosis: No Has patient had a  PCN reaction that required hospitalization: No Has patient had a PCN reaction occurring within the last 10 years: No If all of the above answers are "NO", then may proceed with Cephalosporin use.     Metabolic Disorder Labs: No results found for: HGBA1C, MPG No results found for: PROLACTIN No results found for: CHOL, TRIG, HDL, CHOLHDL, VLDL, LDLCALC No results found for: TSH  Therapeutic Level Labs: No results found for: LITHIUM No results found for: VALPROATE No components found for:  CBMZ  Current Medications: Current Outpatient Medications  Medication Sig Dispense Refill  . polyethylene glycol-electrolytes (NULYTELY/GOLYTELY) 420 g solution May substitute with GaviLyte G Nulytely TriLyte Take as directed    . ACCU-CHEK AVIVA PLUS test strip     . acetaminophen (TYLENOL) 325 MG tablet Take 1 tablet (325 mg total) by mouth every 6 (six) hours as needed for mild pain (or Fever >/= 101).    Marland Kitchen aspirin EC 81 MG tablet Take 81 mg by mouth daily at 12 noon.    Marland Kitchen aspirin EC 81 MG tablet Take by mouth.    Marland Kitchen atorvastatin (LIPITOR) 40 MG tablet Take 40 mg by mouth at bedtime.     Marland Kitchen azelastine (ASTELIN) 0.1 % nasal spray Place 1 spray into both nostrils daily.    . bumetanide (BUMEX) 2 MG tablet Take 1 tablet (2 mg total) by mouth 2 (two) times daily. 60 tablet 11  . busPIRone (BUSPAR) 10 MG tablet Take  2 tablets (20 mg total) by mouth 3 (three) times daily. 540 tablet 1  . dicyclomine (BENTYL) 10 MG capsule     . diphenoxylate-atropine (LOMOTIL) 2.5-0.025 MG tablet Take by mouth.    . doxycycline (VIBRA-TABS) 100 MG tablet     . gabapentin (NEURONTIN) 600 MG tablet Take 2 tablets (1,200 mg total) by mouth 3 (three) times daily. 180 tablet 2  . ibuprofen (ADVIL) 800 MG tablet     . ipratropium (ATROVENT) 0.03 % nasal spray Place into the nose.    Marland Kitchen KLOR-CON M20 20 MEQ tablet Take 20 mEq by mouth daily.     Marland Kitchen lamoTRIgine (LAMICTAL) 100 MG tablet Take 1 tablet (100 mg total) by mouth daily. 90 tablet 0  . lamoTRIgine (LAMICTAL) 25 MG tablet Take 1 tablet (25 mg total) by mouth daily. To be combined with 100 mg 90 tablet 0  . Lancets (ACCU-CHEK SAFE-T PRO) lancets     . levocetirizine (XYZAL) 5 MG tablet     . levothyroxine (SYNTHROID, LEVOTHROID) 200 MCG tablet Take 200 mcg by mouth daily.     . Magnesium Oxide 500 MG CAPS Take 1 capsule (500 mg total) by mouth 2 (two) times daily at 8 am and 10 pm. 180 capsule 1  . Magnesium Oxide, Antacid, 500 MG CAPS Take by mouth.    . meloxicam (MOBIC) 7.5 MG tablet Take 7.5 mg by mouth daily.    . metFORMIN (GLUCOPHAGE) 500 MG tablet     . metFORMIN (GLUCOPHAGE-XR) 500 MG 24 hr tablet     . [START ON 07/21/2018] methocarbamol (ROBAXIN) 750 MG tablet Take 1 tablet (750 mg total) by mouth every 8 (eight) hours as needed for muscle spasms. 90 tablet 2  . montelukast (SINGULAIR) 10 MG tablet Take 10 mg by mouth daily.     Marland Kitchen MYRBETRIQ 50 MG TB24 tablet     . naproxen (EC NAPROSYN) 500 MG EC tablet Take by mouth.    . naproxen (NAPROSYN) 500 MG tablet     .  NICOTROL 10 MG inhaler     . nitroGLYCERIN (NITROSTAT) 0.4 MG SL tablet Place under the tongue.    Marland Kitchen omega-3 acid ethyl esters (LOVAZA) 1 g capsule     . omega-3 fish oil (MAXEPA) 1000 MG CAPS capsule     . omeprazole (PRILOSEC) 20 MG capsule     . ondansetron (ZOFRAN-ODT) 8 MG disintegrating tablet Take  8 mg by mouth as needed.    . prochlorperazine (COMPAZINE) 10 MG tablet     . promethazine (PHENERGAN) 25 MG tablet Take 1 tablet (25 mg total) by mouth every 6 (six) hours as needed for nausea or vomiting. 20 tablet 0  . propranolol (INDERAL) 10 MG tablet Take 1 tablet (10 mg total) by mouth 3 (three) times daily as needed. For severe anxiety symptoms 90 tablet 1  . QUEtiapine (SEROQUEL) 100 MG tablet Take 1.5 tablets (150 mg total) by mouth at bedtime. Patient supplies 135 tablet 0  . rizatriptan (MAXALT) 10 MG tablet Take 1 tablet (10 mg total) by mouth as needed. 10 tablet 2  . torsemide (DEMADEX) 20 MG tablet Take by mouth.    . traMADol (ULTRAM) 50 MG tablet Take 1 tablet (50 mg total) by mouth every 6 (six) hours as needed for severe pain. 120 tablet 2  . TRELEGY ELLIPTA 100-62.5-25 MCG/INH AEPB     . VENTOLIN HFA 108 (90 Base) MCG/ACT inhaler      No current facility-administered medications for this visit.      Musculoskeletal: Strength & Muscle Tone: within normal limits Gait & Station: normal Patient leans: N/A  Psychiatric Specialty Exam: Review of Systems  Psychiatric/Behavioral: The patient is nervous/anxious.   All other systems reviewed and are negative.   There were no vitals taken for this visit.There is no height or weight on file to calculate BMI.  General Appearance: Casual  Eye Contact:  Fair  Speech:  Clear and Coherent  Volume:  Normal  Mood:  Anxious  Affect:  Congruent  Thought Process:  Goal Directed and Descriptions of Associations: Intact  Orientation:  Full (Time, Place, and Person)  Thought Content: Logical   Suicidal Thoughts:  No  Homicidal Thoughts:  No  Memory:  Immediate;   Fair Recent;   Fair Remote;   Fair  Judgement:  Fair  Insight:  Fair  Psychomotor Activity:  Normal  Concentration:  Concentration: Fair and Attention Span: Fair  Recall:  [employer] of Knowledge: Fair  Language: Fair  Akathisia:  No  Handed:  Right  AIMS (if  indicated): denies tremors, rigidity  Assets:  Communication Skills Desire for Improvement Social Support  ADL's:  Intact  Cognition: WNL  Sleep:  Fair   Screenings: PHQ2-9     Procedure visit from 04/22/2018 in [facility] Procedure visit from 03/13/2018 in [facility] [place] Office Visit from 12/23/2017 in [facility] Procedure visit from 12/05/2017 in [facility] Office Visit from 11/13/2017 in [facility]  PHQ-2 Total Score  0  0  0  0  0       Assessment and Plan: Callaway is a 48 year old Caucasian female who has a history of bipolar disorder, panic attacks, tobacco use disorder, multiple medical problems, evaluated by telemedicine today.  Patient with multiple physical problems as well as chronic pain.  Patient continues to struggle with anxiety and agitation.  We  will continue to make medication readjustment.  Plan as noted below.  Plan Bipolar disorder-unstable Increase Seroquel to 150 mg p.o. daily Increase lamotrigine to 125 mg p.o. daily. BuSpar 20 mg p.o. 3 times daily  PTSD- some improvement She was referred for CBT, she will continue psychotherapy sessions with Ms. Alden Hipp.  For panic attacks- improving Propranolol 10 mg p.o. 3 times daily as needed  For tobacco use disorder- unstable Provided smoking cessation counseling. Patient is not ready to quit.  Follow-up in clinic in 2 weeks or sooner if needed.  June 12 at 11:15 AM.  I have spent atleast 15 minutes non face to face with patient today. More than 50 % of the time was spent for psychoeducation and supportive psychotherapy and care coordination.  This note was generated in part or whole with voice recognition software. Voice recognition is usually quite accurate but there are transcription errors that  can and very often do occur. I apologize for any typographical errors that were not detected and corrected.        Ursula Alert, MD 07/08/2018, 5:51 PM

## 2018-07-10 ENCOUNTER — Encounter: Payer: Self-pay | Admitting: Pain Medicine

## 2018-07-10 ENCOUNTER — Other Ambulatory Visit: Payer: Self-pay

## 2018-07-10 ENCOUNTER — Ambulatory Visit: Payer: Medicare HMO | Attending: Nurse Practitioner | Admitting: Pain Medicine

## 2018-07-10 DIAGNOSIS — M545 Low back pain, unspecified: Secondary | ICD-10-CM

## 2018-07-10 DIAGNOSIS — M542 Cervicalgia: Secondary | ICD-10-CM

## 2018-07-10 DIAGNOSIS — M7918 Myalgia, other site: Secondary | ICD-10-CM

## 2018-07-10 DIAGNOSIS — G894 Chronic pain syndrome: Secondary | ICD-10-CM

## 2018-07-10 DIAGNOSIS — M47816 Spondylosis without myelopathy or radiculopathy, lumbar region: Secondary | ICD-10-CM

## 2018-07-10 DIAGNOSIS — M792 Neuralgia and neuritis, unspecified: Secondary | ICD-10-CM

## 2018-07-10 DIAGNOSIS — Z791 Long term (current) use of non-steroidal anti-inflammatories (NSAID): Secondary | ICD-10-CM

## 2018-07-10 DIAGNOSIS — K2960 Other gastritis without bleeding: Secondary | ICD-10-CM

## 2018-07-10 DIAGNOSIS — M25511 Pain in right shoulder: Secondary | ICD-10-CM

## 2018-07-10 DIAGNOSIS — M25572 Pain in left ankle and joints of left foot: Secondary | ICD-10-CM

## 2018-07-10 DIAGNOSIS — G6289 Other specified polyneuropathies: Secondary | ICD-10-CM

## 2018-07-10 DIAGNOSIS — T39395A Adverse effect of other nonsteroidal anti-inflammatory drugs [NSAID], initial encounter: Secondary | ICD-10-CM

## 2018-07-10 DIAGNOSIS — M150 Primary generalized (osteo)arthritis: Secondary | ICD-10-CM

## 2018-07-10 DIAGNOSIS — M25512 Pain in left shoulder: Secondary | ICD-10-CM

## 2018-07-10 DIAGNOSIS — G8929 Other chronic pain: Secondary | ICD-10-CM

## 2018-07-10 DIAGNOSIS — K219 Gastro-esophageal reflux disease without esophagitis: Secondary | ICD-10-CM

## 2018-07-10 DIAGNOSIS — M79605 Pain in left leg: Secondary | ICD-10-CM

## 2018-07-10 DIAGNOSIS — M159 Polyosteoarthritis, unspecified: Secondary | ICD-10-CM | POA: Insufficient documentation

## 2018-07-10 DIAGNOSIS — M8949 Other hypertrophic osteoarthropathy, multiple sites: Secondary | ICD-10-CM | POA: Insufficient documentation

## 2018-07-10 MED ORDER — TRAMADOL HCL 50 MG PO TABS: 50.0000 mg | ORAL_TABLET | Freq: Four times a day (QID) | ORAL | 2 refills | Status: DC | PRN

## 2018-07-10 MED ORDER — METHOCARBAMOL 750 MG PO TABS: 750.0000 mg | ORAL_TABLET | Freq: Three times a day (TID) | ORAL | 2 refills | Status: DC | PRN

## 2018-07-10 MED ORDER — MAGNESIUM OXIDE -MG SUPPLEMENT 500 MG PO CAPS: 1.0000 | ORAL_CAPSULE | Freq: Two times a day (BID) | ORAL | 0 refills | Status: DC

## 2018-07-10 MED ORDER — GABAPENTIN 600 MG PO TABS: 1200.0000 mg | ORAL_TABLET | Freq: Three times a day (TID) | ORAL | 2 refills | Status: DC

## 2018-07-10 MED ORDER — OMEPRAZOLE 20 MG PO CPDR: 20.0000 mg | DELAYED_RELEASE_CAPSULE | Freq: Every day | ORAL | 2 refills | Status: DC

## 2018-07-10 MED ORDER — MELOXICAM 15 MG PO TABS: 15.0000 mg | ORAL_TABLET | Freq: Every day | ORAL | 2 refills | Status: DC

## 2018-07-10 NOTE — Patient Instructions (Signed)
____________________________________________________________________________________________  Medication Rules  Purpose: To inform patients, and their family members, of our rules and regulations.  Applies to: All patients receiving prescriptions (written or electronic).  Pharmacy of record: Pharmacy where electronic prescriptions will be sent. If written prescriptions are taken to a different pharmacy, please inform the nursing staff. The pharmacy listed in the electronic medical record should be the one where you would like electronic prescriptions to be sent.  Electronic prescriptions: In compliance with the [place] Strengthen Opioid Misuse Prevention (STOP) Act of 2017 (Session Law 2017-74/H243), effective February 12, 2018, all controlled substances must be electronically prescribed. Calling prescriptions to the pharmacy will cease to exist.  Prescription refills: Only during scheduled appointments. Applies to all prescriptions.  NOTE: The following applies primarily to controlled substances (Opioid* Pain Medications).   Patient's responsibilities: 1. Pain Pills: Bring all pain pills to every appointment (except for procedure appointments). 2. Pill Bottles: Bring pills in original pharmacy bottle. Always bring the newest bottle. Bring bottle, even if empty. 3. Medication refills: You are responsible for knowing and keeping track of what medications you take and those you need refilled. The day before your appointment: write a list of all prescriptions that need to be refilled. The day of the appointment: give the list to the admitting nurse. Prescriptions will be written only during appointments. No prescriptions will be written on procedure days. If you forget a medication: it will not be "Called in", "Faxed", or "electronically sent". You will need to get another appointment to get these prescribed. No early refills. Do not call asking to have your prescription filled  early. 4. Prescription Accuracy: You are responsible for carefully inspecting your prescriptions before leaving our office. Have the discharge nurse carefully go over each prescription with you, before taking them home. Make sure that your name is accurately spelled, that your address is correct. Check the name and dose of your medication to make sure it is accurate. Check the number of pills, and the written instructions to make sure they are clear and accurate. Make sure that you are given enough medication to last until your next medication refill appointment. 5. Taking Medication: Take medication as prescribed. When it comes to controlled substances, taking less pills or less frequently than prescribed is permitted and encouraged. Never take more pills than instructed. Never take medication more frequently than prescribed.  6. Inform other Doctors: Always inform, all of your healthcare providers, of all the medications you take. 7. Pain Medication from other Providers: You are not allowed to accept any additional pain medication from any other Doctor or Healthcare provider. There are two exceptions to this rule. (see below) In the event that you require additional pain medication, you are responsible for notifying us, as stated below. 8. Medication Agreement: You are responsible for carefully reading and following our Medication Agreement. This must be signed before receiving any prescriptions from our practice. Safely store a copy of your signed Agreement. Violations to the Agreement will result in no further prescriptions. (Additional copies of our Medication Agreement are available upon request.) 9. Laws, Rules, & Regulations: All patients are expected to follow all Federal and State Laws, Statutes, Rules, & Regulations. Ignorance of the Laws does not constitute a valid excuse. The use of any illegal substances is prohibited. 10. Adopted CDC guidelines & recommendations: Target dosing levels will be  at or below 60 MME/day. Use of benzodiazepines** is not recommended.  Exceptions: There are only two exceptions to the rule of not   receiving pain medications from other Healthcare Providers. 1. Exception #1 (Emergencies): In the event of an emergency (i.e.: accident requiring emergency care), you are allowed to receive additional pain medication. However, you are responsible for: As soon as you are able, call our office [phone], at any time of the day or night, and leave a message stating your name, the date and nature of the emergency, and the name and dose of the medication prescribed. In the event that your call is answered by a member of our staff, make sure to document and save the date, time, and the name of the person that took your information.  2. Exception #2 (Planned Surgery): In the event that you are scheduled by another doctor or dentist to have any type of surgery or procedure, you are allowed (for a period no longer than 30 days), to receive additional pain medication, for the acute post-op pain. However, in this case, you are responsible for picking up a copy of our "Post-op Pain Management for Surgeons" handout, and giving it to your surgeon or dentist. This document is available at our office, and does not require an appointment to obtain it. Simply go to our office during business hours (Monday-Thursday from 8:00 AM to 4:00 PM) (Friday 8:00 AM to 12:00 Noon) or if you have a scheduled appointment with us, prior to your surgery, and ask for it by name. In addition, you will need to provide us with your name, name of your surgeon, type of surgery, and date of procedure or surgery.  *Opioid medications include: morphine, codeine, oxycodone, oxymorphone, hydrocodone, hydromorphone, meperidine, tramadol, tapentadol, buprenorphine, fentanyl, methadone. **Benzodiazepine medications include: diazepam (Valium), alprazolam (Xanax), clonazepam (Klonopine), lorazepam (Ativan), clorazepate  (Tranxene), chlordiazepoxide (Librium), estazolam (Prosom), oxazepam (Serax), temazepam (Restoril), triazolam (Halcion) (Last updated: 04/11/2017) ____________________________________________________________________________________________   ____________________________________________________________________________________________  Medication Recommendations and Reminders  Applies to: All patients receiving prescriptions (written and/or electronic).  Medication Rules & Regulations: These rules and regulations exist for your safety and that of others. They are not flexible and neither are we. Dismissing or ignoring them will be considered "non-compliance" with medication therapy, resulting in complete and irreversible termination of such therapy. (See document titled "Medication Rules" for more details.) In all conscience, because of safety reasons, we cannot continue providing a therapy where the patient does not follow instructions.  Pharmacy of record:   Definition: This is the pharmacy where your electronic prescriptions will be sent.   We do not endorse any particular pharmacy.  You are not restricted in your choice of pharmacy.  The pharmacy listed in the electronic medical record should be the one where you want electronic prescriptions to be sent.  If you choose to change pharmacy, simply notify our nursing staff of your choice of new pharmacy.  Recommendations:  Keep all of your pain medications in a safe place, under lock and key, even if you live alone.   After you fill your prescription, take 1 week's worth of pills and put them away in a safe place. You should keep a separate, properly labeled bottle for this purpose. The remainder should be kept in the original bottle. Use this as your primary supply, until it runs out. Once it's gone, then you know that you have 1 week's worth of medicine, and it is time to come in for a prescription refill. If you do this correctly, it  is unlikely that you will ever run out of medicine.  To make sure that the above recommendation works,   it is very important that you make sure your medication refill appointments are scheduled at least 1 week before you run out of medicine. To do this in an effective manner, make sure that you do not leave the office without scheduling your next medication management appointment. Always ask the nursing staff to show you in your prescription , when your medication will be running out. Then arrange for the receptionist to get you a return appointment, at least 7 days before you run out of medicine. Do not wait until you have 1 or 2 pills left, to come in. This is very poor planning and does not take into consideration that we may need to cancel appointments due to bad weather, sickness, or emergencies affecting our staff.  "Partial Fill": If for any reason your pharmacy does not have enough pills/tablets to completely fill or refill your prescription, do not allow for a "partial fill". You will need a separate prescription to fill the remaining amount, which we will not provide. If the reason for the partial fill is your insurance, you will need to talk to the pharmacist about payment alternatives for the remaining tablets, but again, do not accept a partial fill.  Prescription refills and/or changes in medication(s):   Prescription refills, and/or changes in dose or medication, will be conducted only during scheduled medication management appointments. (Applies to both, written and electronic prescriptions.)  No refills on procedure days. No medication will be changed or started on procedure days. No changes, adjustments, and/or refills will be conducted on a procedure day. Doing so will interfere with the diagnostic portion of the procedure.  No phone refills. No medications will be "called into the pharmacy".  No Fax refills.  No weekend refills.  No Holliday refills.  No after hours  refills.  Remember:  Business hours are:  Monday to Thursday 8:00 AM to 4:00 PM Provider's Schedule: Crystal King, NP - Appointments are:  Medication management: Monday to Thursday 8:00 AM to 4:00 PM Laquasia Pincus, MD - Appointments are:  Medication management: Monday and Wednesday 8:00 AM to 4:00 PM Procedure day: Tuesday and Thursday 7:30 AM to 4:00 PM Bilal Lateef, MD - Appointments are:  Medication management: Tuesday and Thursday 8:00 AM to 4:00 PM Procedure day: Monday and Wednesday 7:30 AM to 4:00 PM (Last update: 04/11/2017) ____________________________________________________________________________________________   ____________________________________________________________________________________________  CANNABIDIOL (AKA: CBD Oil or Pills)  Applies to: All patients receiving prescriptions of controlled substances (written and/or electronic).  General Information: Cannabidiol (CBD) was discovered in 1940. It is one of some 113 identified cannabinoids in cannabis (Marijuana) plants, accounting for up to 40% of the plant's extract. As of 2018, preliminary clinical research on cannabidiol included studies of anxiety, cognition, movement disorders, and pain.  Cannabidiol is consummed in multiple ways, including inhalation of cannabis smoke or vapor, as an aerosol spray into the cheek, and by mouth. It may be supplied as CBD oil containing CBD as the active ingredient (no added tetrahydrocannabinol (THC) or terpenes), a full-plant CBD-dominant hemp extract oil, capsules, dried cannabis, or as a liquid solution. CBD is thought not have the same psychoactivity as THC, and may affect the actions of THC. Studies suggest that CBD may interact with different biological targets, including cannabinoid receptors and other neurotransmitter receptors. As of 2018 the mechanism of action for its biological effects has not been determined.  In the [place], cannabidiol has a limited  approval by the Food and Drug Administration (FDA) for treatment of only two types   of epilepsy disorders. The side effects of long-term use of the drug include somnolence, decreased appetite, diarrhea, fatigue, malaise, weakness, sleeping problems, and others.  CBD remains a Schedule I drug prohibited for any use.  Legality: Some manufacturers ship CBD products nationally, an illegal action which the FDA has not enforced in 2018, with CBD remaining the subject of an FDA investigational new drug evaluation, and is not considered legal as a dietary supplement or food ingredient as of December 2018. Federal illegality has made it difficult historically to conduct research on CBD. CBD is openly sold in head shops and health food stores in some states where such sales have not been explicitly legalized.  Warning: Because it is not FDA approved for general use or treatment of pain, it is not required to undergo the same manufacturing controls as prescription drugs.  This means that the available cannabidiol (CBD) may be contaminated with THC.  If this is the case, it will trigger a positive urine drug screen (UDS) test for cannabinoids (Marijuana).  Because a positive UDS for illicit substances is a violation of our medication agreement, your opioid analgesics (pain medicine) may be permanently discontinued. (Last update: 05/02/2017) ____________________________________________________________________________________________    

## 2018-07-10 NOTE — Progress Notes (Signed)
Pain Management Virtual Encounter Note - Virtual Visit via Telephone Telehealth (real-time audio visits between healthcare provider and patient).  Patient's Phone No. & Preferred Pharmacy:  [phone] (home); [phone] (mobile); (Preferred) [phone] [email or web address]  [place], [place] [place] [address] Phone: [phone] Fax: [phone]   Pre-screening note:  Our staff contacted Marie Clarke and offered her an "in person", "face-to-face" appointment versus a telephone encounter. She indicated preferring the telephone encounter, at this time.  Reason for Virtual Visit: COVID-19*  Social distancing based on CDC and AMA recommendations.   I contacted [name] on 07/10/2018 at 12:50 PM via telephone.      I clearly identified myself as Gaspar Cola, MD. I verified that I was speaking with the correct person using two identifiers (Name and date of birth: [birth year]).  Advanced Informed Consent I sought verbal advanced consent from [facility] for virtual visit interactions. I informed Marie Clarke of possible security and privacy concerns, risks, and limitations associated with providing "not-in-person" medical evaluation and management services. I also informed Marie Clarke of the availability of "in-person" appointments. Finally, I informed her that there would be a charge for the virtual visit and that she could be  personally, fully or partially, financially responsible for it. Marie Clarke expressed understanding and agreed to proceed.   Historic Elements   Marie Clarke is a 48 y.o. year old, female patient evaluated today after her last encounter by our practice on 06/03/2018. [name]  has a past medical history of Anemia, Anxiety, Asthma, Bipolar disorder ([place]), CAD (coronary artery disease) ([place]), CHF (congestive heart failure) ([place]), COPD (chronic obstructive pulmonary disease) ([place]), Depression ([place]), Diabetes mellitus  without complication ([place]), Diabetes mellitus, type II ([place]), Drug overdose, GERD (gastroesophageal reflux disease), Headache, Hyperlipidemia, Hypertension, Left leg pain (04/29/2014), MI (myocardial infarction) ([place]), Muscle ache (09/16/2014), Osteoporosis, Overactive bladder, Pancreatitis ([place]), Reflex sympathetic dystrophy, Renal insufficiency, Sleep apnea, Sleep apnea, Stroke ([place]), Thyroid disease, TIA (transient ischemic attack) ([place]), and TIA (transient ischemic attack). She also  has a past surgical history that includes prolapse rectum surgery (N/A, July 2016); Abdominal hysterectomy; Tonsillectomy; and Hernia repair (07/15/2017). Ms. Messamore has a current medication list which includes the following prescription(s): accu-chek aviva plus, acetaminophen, aspirin ec, atorvastatin, azelastine, buspirone, dicyclomine, diphenoxylate-atropine, gabapentin, ipratropium, klor-con m20, lamotrigine, lamotrigine, accu-chek safe-t pro, levocetirizine, levothyroxine, magnesium oxide, meloxicam, metformin, methocarbamol, montelukast, myrbetriq, nitroglycerin, omega-3 acid ethyl esters, omega-3 fish oil, omeprazole, ondansetron, prochlorperazine, promethazine, propranolol, quetiapine, rizatriptan, torsemide, tramadol, trelegy ellipta, ventolin hfa, and nicotrol. She  reports that she has been smoking cigarettes. She has been smoking about 0.50 packs per day. She has never used smokeless tobacco. She reports that she does not drink alcohol or use drugs. Marie Clarke is allergic to diazepam; levofloxacin; metronidazole; pregabalin; sulfa antibiotics; cephalexin; ciprofloxacin; ziprasidone hcl; and penicillins.   HPI  I last communicated with her on 05/27/2018. Today, she is being contacted for medication management.  Today I had a very long conversation with this patient.  Apparently she was a little confused regarding her medications.  I asked her to get some paper and pen and to take notch while we were discussing the medicines.   I make sure that she understood each of the medicines that we are prescribing, including the family that they belong to and the purpose of taking each one of them.  The first thing that we went over, which took a long time, was her NSAIDs.  She  had not connected the dots in terms of the gastritis and GI bleeding and how that was related to her medication use.  Among the medications that she was taking she had naproxen, ibuprofen, and meloxicam.  In view of her gastritis, I eliminated the naproxen and the ibuprofen and left her only on the meloxicam.  I did increase her meloxicam to 15 mg daily in a.m. with breakfast.  I also instructed the patient to take Prilosec 20 mg p.o. daily at bedtime + magnesium 500 mg p.o. twice daily to help her with the acid reflux and gastritis.  The patient mentioned that approximately 2 weeks ago, she started experiencing itching with the tramadol.  Because it only started 2 weeks ago, I looked further into this and it turns out that she was doing well on the tramadol by itself, but approximately 2 weeks ago she started taking it in conjunction with naproxen and this is when she started experiencing the itching approximately 2 hours after taking the medicines.  I explained to the patient the possibility of a drug to drug interaction and because we had already stopped the naproxen, I think that this may not be a problem in the future.  I explained to the patient that the magnesium and the Robaxin would both work as muscle relaxants, while the gabapentin should work for the neuropathic component of her pain.  I also explained to her that the NSAIDs along with the tramadol, they should both help with somatic pain and pain secondary to inflammation.  The patient took good notes of all of this and indicated understanding things better.  Hopefully, today's visit will call long ways in preventing any further side effects or complications from the use of her medicines.  Pharmacotherapy  Assessment  Analgesic: Tramadol 50 mg 1 tablet 4 times daily (fill date 06/30/2018) tramadol 200 mg/day MME/day:20mg /day   Monitoring: Pharmacotherapy: No side-effects or adverse reactions reported.  None from the tramadol.  Initially she thought that she was having itching secondary to the tramadol, but it would appear it was a drug to drug interaction between the tramadol and the naproxen. [place] PMP: PDMP reviewed during this encounter.       Compliance: No problems identified. Effectiveness: Clinically acceptable. Plan: Refer to "POC".  Pertinent Labs  Renal Function Lab Results  Component Value Date   BUN 9 06/08/2018   CREATININE 0.73 06/08/2018   BCR 16 09/09/2017   GFRAA >60 06/08/2018   GFRNONAA >60 06/08/2018   Hepatic Function Lab Results  Component Value Date   AST 16 09/09/2017   ALT 14 08/10/2017   ALBUMIN 4.6 09/09/2017   UDS Summary  Date Value Ref Range Status  01/22/2018 FINAL  Final    Comment:    ==================================================================== TOXASSURE SELECT 13 (MW) ==================================================================== Test                             Result       Flag       Units Drug Present and Declared for Prescription Verification   Tramadol                       2742         EXPECTED   ng/mg creat   O-Desmethyltramadol            5630         EXPECTED   ng/mg creat   N-Desmethyltramadol  2591         EXPECTED   ng/mg creat    Source of tramadol is a prescription medication.    O-desmethyltramadol and N-desmethyltramadol are expected    metabolites of tramadol. ==================================================================== Test                      Result    Flag   Units      Ref Range   Creatinine              66               mg/dL      >=20 ==================================================================== Declared Medications:  The flagging and interpretation on this report are based on  the  following declared medications.  Unexpected results may arise from  inaccuracies in the declared medications.  **Note: The testing scope of this panel includes these medications:  Tramadol  **Note: The testing scope of this panel does not include following  reported medications:  Acetaminophen  Aspirin (Aspirin 81)  Atorvastatin  Atropine (Lomotil)  Azelastine  Bumetanide  Buspirone  Diphenoxylate (Lomotil)  Fluticasone (Trelegy Ellipta)  Gabapentin  Hydroxyzine (Vistaril)  Lamotrigine  Levocetirizine (Xyzal)  Levothyroxine  Magnesium Oxide  Metformin  Methocarbamol  Mirabegron (Myrbetriq)  Montelukast  Nicotine  Omega-3 Fatty Acids  Ondansetron  Potassium  Potassium (Klor-Con)  Promethazine  Quetiapine  Rizatriptan  Umeclidinium (Trelegy Ellipta)  Vilanterol (Trelegy Ellipta) ==================================================================== For clinical consultation, please call [phone]. ====================================================================    Note: Above Lab results reviewed.  Recent imaging  DG Chest Portable 1 View CLINICAL DATA:  Short of breath  EXAM: PORTABLE CHEST 1 VIEW  COMPARISON:  03/28/2018  FINDINGS: Borderline cardiomegaly. Diffuse bilateral interstitial opacity, slightly nodular in appearance. No pleural effusion. No pneumothorax.  IMPRESSION: Borderline cardiomegaly. Diffuse bilateral interstitial opacity with somewhat nodular appearance, possible interstitial inflammatory process or atypical pneumonia.  Electronically Signed   By: Donavan Foil M.D.   On: 06/08/2018 19:28  Assessment  The primary encounter diagnosis was Chronic pain syndrome. Diagnoses of Chronic low back pain (Primary Area of Pain) (Bilateral) (L>R), Lumbar facet syndrome (Bilateral) (L>R), Chronic lower extremity pain (Referred) (Secondary Area of Pain) (Left), Chronic ankle pain (Tertiary Area of Pain) (Left), Chronic neck pain  (Fourth Area of Pain) (Bilateral) (L>R), Chronic shoulder pain (Fifth Area of Pain) (Bilateral) (L>R), Chronic musculoskeletal pain, Gastroesophageal reflux disease without esophagitis, Neurogenic pain, Disease related peripheral neuropathy, Osteoarthritis involving multiple joints, Long term current use of non-steroidal anti-inflammatories (NSAID), and NSAID induced gastritis were also pertinent to this visit.  Plan of Care  I have discontinued Stanton Kidney Hou's bumetanide, naproxen, naproxen, doxycycline, ibuprofen, Magnesium Oxide (Antacid), and polyethylene glycol-electrolytes. I have also changed her traMADol, omeprazole, and meloxicam. Additionally, I am having her maintain her atorvastatin, levothyroxine, montelukast, aspirin EC, azelastine, Klor-Con M20, acetaminophen, promethazine, Trelegy Ellipta, omega-3 fish oil, Nicotrol, Myrbetriq, accu-chek safe-t pro, diphenoxylate-atropine, ondansetron, Ventolin HFA, Accu-Chek Aviva Plus, lamoTRIgine, rizatriptan, propranolol, dicyclomine, ipratropium, levocetirizine, nitroGLYCERIN, omega-3 acid ethyl esters, prochlorperazine, torsemide, metFORMIN, QUEtiapine, busPIRone, lamoTRIgine, methocarbamol, Magnesium Oxide, and gabapentin.  Pharmacotherapy (Medications Ordered): Meds ordered this encounter  Medications  . methocarbamol (ROBAXIN) 750 MG tablet    Sig: Take 1 tablet (750 mg total) by mouth every 8 (eight) hours as needed for muscle spasms.    Dispense:  90 tablet    Refill:  2    Fill one day early if pharmacy is closed on scheduled refill date. May substitute for generic if  available. Delete any older pending refills on this medication.  . Magnesium Oxide 500 MG CAPS    Sig: Take 1 capsule (500 mg total) by mouth 2 (two) times daily at 8 am and 10 pm.    Dispense:  180 capsule    Refill:  0    Fill one day early if pharmacy is closed on scheduled refill date. May substitute for generic if available. Delete any older pending refills on this  medication.  . traMADol (ULTRAM) 50 MG tablet    Sig: Take 1 tablet (50 mg total) by mouth every 6 (six) hours as needed for severe pain. Max: 4/day    Dispense:  120 tablet    Refill:  2    Chronic Pain. (STOP Act - Not applicable). Fill one day early if closed on scheduled refill date. Do not fill until: 07/30/18. To last until: 10/28/18. Delete any older pending refills on this medication.  . gabapentin (NEURONTIN) 600 MG tablet    Sig: Take 2 tablets (1,200 mg total) by mouth 3 (three) times daily.    Dispense:  180 tablet    Refill:  2    Fill one day early if pharmacy is closed on scheduled refill date. May substitute for generic if available. Please delete any other pending refills on prior prescriptions of this medication.  Marland Kitchen omeprazole (PRILOSEC) 20 MG capsule    Sig: Take 1 capsule (20 mg total) by mouth at bedtime.    Dispense:  30 capsule    Refill:  2    Fill one day early if pharmacy is closed on scheduled refill date. May substitute for generic if available. Please delete any other pending refills on prior prescriptions of this medication.  . meloxicam (MOBIC) 15 MG tablet    Sig: Take 1 tablet (15 mg total) by mouth daily with breakfast. Max: 1/day    Dispense:  30 tablet    Refill:  2    Fill one day early if pharmacy is closed on scheduled refill date. May substitute for generic if available. Please delete any other pending refills on prior prescriptions of this medication.   Orders:  No orders of the defined types were placed in this encounter.  Follow-up plan:   Return in about 3 months (around 10/22/2018) for (3 mo) Med-Mgmt.    I discussed the assessment and treatment plan with the patient. The patient was provided an opportunity to ask questions and all were answered. The patient agreed with the plan and demonstrated an understanding of the instructions.  Patient advised to call back or seek an in-person evaluation if the symptoms or condition worsens.  Total  duration of non-face-to-face encounter: 25 minutes.  Note by: Gaspar Cola, MD Date: 07/10/2018; Time: 1:36 PM  Note: This dictation was prepared with Dragon dictation. Any transcriptional errors that may result from this process are unintentional.  Disclaimer:  * Given the special circumstances of the COVID-19 pandemic, the federal government has announced that the Office for Civil Rights (OCR) will exercise its enforcement discretion and will not impose penalties on physicians using telehealth in the event of noncompliance with regulatory requirements under the [place] (HIPAA) in connection with the good faith provision of telehealth during the MPNTI-14 national public health emergency. ([place])

## 2018-07-14 ENCOUNTER — Other Ambulatory Visit: Payer: Self-pay | Admitting: Psychiatry

## 2018-07-14 DIAGNOSIS — F3160 Bipolar disorder, current episode mixed, unspecified: Secondary | ICD-10-CM

## 2018-07-15 ENCOUNTER — Other Ambulatory Visit: Payer: Self-pay

## 2018-07-15 ENCOUNTER — Encounter: Payer: Self-pay | Admitting: Licensed Clinical Social Worker

## 2018-07-15 ENCOUNTER — Ambulatory Visit (INDEPENDENT_AMBULATORY_CARE_PROVIDER_SITE_OTHER): Payer: Medicare HMO | Admitting: Licensed Clinical Social Worker

## 2018-07-15 DIAGNOSIS — F3160 Bipolar disorder, current episode mixed, unspecified: Secondary | ICD-10-CM | POA: Diagnosis not present

## 2018-07-15 NOTE — Progress Notes (Signed)
Virtual Visit via Telephone Note  I connected with Marie Clarke on 07/15/18 at 11:00 AM EDT by telephone and verified that I am speaking with the correct person using two identifiers.   I discussed the limitations, risks, security and privacy concerns of performing an evaluation and management service by telephone and the availability of in person appointments. I also discussed with the patient that there may be a patient responsible charge related to this service. The patient expressed understanding and agreed to proceed.   I discussed the assessment and treatment plan with the patient. The patient was provided an opportunity to ask questions and all were answered. The patient agreed with the plan and demonstrated an understanding of the instructions.   The patient was advised to call back or seek an in-person evaluation if the symptoms worsen or if the condition fails to improve as anticipated.  I provided 30 minutes of non-face-to-face time during this encounter.   Alden Hipp, Marie Clarke    THERAPIST PROGRESS NOTE  Session Time: 1100  Participation Level: Minimal  Behavioral Response: NAAlertEuphoric  Type of Therapy: Individual Therapy  Treatment Goals addressed: Coping  Interventions: Supportive  Summary: Marie Clarke is a 48 y.o. female who presents with continued symptoms related to her diagnosis. Marie Clarke reports doing well since our last session. She reports the doctor in the clinic started her on new medication Marie Clarke began the session by asking if Marie Clarke could speak with the MD to "get her a medication for her nerves." When Marie Clarke explained she cannot prescribe medications, pt asked if Marie Clarke could "just talk to the doctor about it." Marie Clarke explained all medication decisions and conversations should be had with MD and explained MD has access to all therapy notes so could read them if she needed further information. Marie Clarke expressed understanding. However, later in the conversation Marie Clarke expressed not  wanting a new medication but wanting to ensure she was able to take her current medication three times per day. Marie Clarke explained that, if that is what the doctor prescribed and how she prescribed it, then yes she should take it as it's written by the doctor. Marie Clarke expressed understanding and agreement. Marie Clarke was guarded throughout the session and reported everything was great. She reported no current issues at all. She reports she has been coloring, doing bible tests, and going for walks to manage any anxiety that occurs, but she reported again she has experienced no anxiety recently. Marie Clarke praised Marie Clarke for utilizing coping mechanisms, and encouraged her to come up with new skills she can utilize when she is not able to use the ones she's recently established. Marie Clarke provided Marie Clarke with a few examples of what that could look like. Marie Clarke expressed understanding and agreement.   Suicidal/Homicidal: No Therapist Response: Marie Clarke continues to work towards her tx goals but has not yet reached them. We will continue to work on emotional regulation skills and developing coping mechanisms moving forward.   Plan: Return again in 4 weeks.  Diagnosis: Axis I: Bipolar I    Axis II: No diagnosis    Alden Hipp, Marie Clarke 07/15/2018

## 2018-07-24 ENCOUNTER — Other Ambulatory Visit: Payer: Self-pay

## 2018-07-24 ENCOUNTER — Ambulatory Visit (INDEPENDENT_AMBULATORY_CARE_PROVIDER_SITE_OTHER): Payer: Medicare HMO | Admitting: Psychiatry

## 2018-07-24 ENCOUNTER — Encounter: Payer: Self-pay | Admitting: Psychiatry

## 2018-07-24 DIAGNOSIS — F3160 Bipolar disorder, current episode mixed, unspecified: Secondary | ICD-10-CM

## 2018-07-24 DIAGNOSIS — F410 Panic disorder [episodic paroxysmal anxiety] without agoraphobia: Secondary | ICD-10-CM

## 2018-07-24 DIAGNOSIS — F17200 Nicotine dependence, unspecified, uncomplicated: Secondary | ICD-10-CM | POA: Diagnosis not present

## 2018-07-24 DIAGNOSIS — F4310 Post-traumatic stress disorder, unspecified: Secondary | ICD-10-CM

## 2018-07-24 MED ORDER — PROPRANOLOL HCL 20 MG PO TABS: 20.0000 mg | ORAL_TABLET | Freq: Three times a day (TID) | ORAL | 1 refills | Status: DC | PRN

## 2018-07-24 MED ORDER — DIPHENHYDRAMINE HCL 25 MG PO TABS: 25.0000 mg | ORAL_TABLET | Freq: Two times a day (BID) | ORAL | 1 refills | Status: DC | PRN

## 2018-07-24 NOTE — Progress Notes (Signed)
Virtual Visit via Video Note  I connected with Marie Clarke on 07/24/18 at 10:45 AM EDT by a video enabled telemedicine application and verified that I am speaking with the correct person using two identifiers.   I discussed the limitations of evaluation and management by telemedicine and the availability of in person appointments. The patient expressed understanding and agreed to proceed.     I discussed the assessment and treatment plan with the patient. The patient was provided an opportunity to ask questions and all were answered. The patient agreed with the plan and demonstrated an understanding of the instructions.   The patient was advised to call back or seek an in-person evaluation if the symptoms worsen or if the condition fails to improve as anticipated.   [place] MD OP Progress Note  07/24/2018 12:46 PM Marie Clarke  MRN:  [id number]  Chief Complaint:  Chief Complaint    Follow-up     HPI: Marie Clarke is a 48 year old Caucasian female, single, on SSD, lives in [place], has a history of bipolar disorder, COPD, OSA on CPAP, coronary artery disease, hyperlipidemia, hypertension, hypothyroidism, back pain was evaluated by telemedicine today.  Patient today reports continues to feel anxious and nervous.  She reports she has noticed itching all over her body when she is extremely anxious.  She reports she tried getting off of the tramadol since it was thought that the tramadol could be contributing to the itching.  She however reports the itching remained even when she was not taking the tramadol.  She reports she has tried doing relaxing activities with her friends and noticed that her itching subsides when she is more relaxed.  She reports she cannot sleep some nights due to the itching.  She reports  medication changes which were made last time helped to some extent with her anxiety symptoms.  She reports she does not have any suicidality at this time.  She denies any perceptual  disturbances.  Patient continues to have good support system.  She denies any other concerns today. Visit Diagnosis:    ICD-10-CM   1. Bipolar I disorder, most recent episode mixed ([place])  F31.60   2. PTSD (post-traumatic stress disorder)  F43.10   3. Panic attacks  F41.0 propranolol (INDERAL) 20 MG tablet  4. Tobacco use disorder  F17.200     Past Psychiatric History: Reviewed past psychiatric history from my progress note on 03/06/2017.  Past Medical History:  Past Medical History:  Diagnosis Date  . Anemia   . Anxiety   . Asthma   . Bipolar disorder ([place])   . CAD (coronary artery disease) unk  . CHF (congestive heart failure) ([place])   . COPD (chronic obstructive pulmonary disease) ([place])   . Depression unk  . Diabetes mellitus without complication ([place])   . Diabetes mellitus, type II ([place])   . Drug overdose   . GERD (gastroesophageal reflux disease)   . Headache   . Hyperlipidemia   . Hypertension   . Left leg pain 04/29/2014  . MI (myocardial infarction) ([place])   . Muscle ache 09/16/2014  . Osteoporosis   . Overactive bladder   . Pancreatitis unk  . Reflex sympathetic dystrophy   . Renal insufficiency   . Sleep apnea    pt reported on 2/6/7 she currently is not using CPAP  . Sleep apnea   . Stroke ([place])   . Thyroid disease   . TIA (transient ischemic attack) unk  . TIA (transient ischemic attack)  Past Surgical History:  Procedure Laterality Date  . ABDOMINAL HYSTERECTOMY    . HERNIA REPAIR  07/15/2017  . prolapse rectum surgery N/A July 2016  . TONSILLECTOMY      Family Psychiatric History: Reviewed family psychiatric history from my progress note on 03/06/2017.  Family History:  Family History  Problem Relation Age of Onset  . Diabetes Mellitus II Mother   . CAD Mother   . Sleep apnea Mother   . Osteoarthritis Mother   . Osteoporosis Mother   . Anxiety disorder Mother   . Depression Mother   . Bipolar disorder Mother   . Bipolar disorder Father    . Hypertension Father   . Depression Father   . Anxiety disorder Father   . Post-traumatic stress disorder Sister     Social History: Reviewed social history from my progress note on 03/06/2017. Social History   Socioeconomic History  . Marital status: Single    Spouse name: Not on file  . Number of children: 0  . Years of education: Not on file  . Highest education level: High school graduate  Occupational History    Comment: not employed  Social Needs  . Financial resource strain: Not hard at all  . Food insecurity    Worry: Never true    Inability: Never true  . Transportation needs    Medical: Yes    Non-medical: Yes  Tobacco Use  . Smoking status: Current Every Day Smoker    Packs/day: 0.50    Types: Cigarettes  . Smokeless tobacco: Never Used  Substance and Sexual Activity  . Alcohol use: No    Alcohol/week: 0.0 standard drinks  . Drug use: No  . Sexual activity: Not Currently  Lifestyle  . Physical activity    Days per week: 0 days    Minutes per session: 0 min  . Stress: Not at all  Relationships  . Social connections    Talks on phone: More than three times a week    Gets together: More than three times a week    Attends religious service: More than 4 times per year    Active member of club or organization: No    Attends meetings of clubs or organizations: Never    Relationship status: Never married  Other Topics Concern  . Not on file  Social History Narrative  . Not on file    Allergies:  Allergies  Allergen Reactions  . Diazepam Hives and Nausea And Vomiting  . Levofloxacin Hives and Rash    1/31: would like to retry since not taking valium  . Metronidazole Hives    1/31: would like to retry since she is no longer taking valium  . Pregabalin Hives and Rash    1/31 currently taking lyrica without reaction since no longer taking valium  . Sulfa Antibiotics Hives  . Cephalexin Hives  . Ciprofloxacin Hives  . Ziprasidone Hcl Other (See  Comments)    CONFUSED CONFUSED Other reaction(s): Other (See Comments) CONFUSED CONFUSED   . Penicillins Nausea And Vomiting    Has patient had a PCN reaction causing immediate rash, facial/tongue/throat swelling, SOB or lightheadedness with hypotension: No Has patient had a PCN reaction causing severe rash involving mucus membranes or skin necrosis: No Has patient had a PCN reaction that required hospitalization: No Has patient had a PCN reaction occurring within the last 10 years: No If all of the above answers are "NO", then may proceed with Cephalosporin use.  Metabolic Disorder Labs: No results found for: HGBA1C, MPG No results found for: PROLACTIN No results found for: CHOL, TRIG, HDL, CHOLHDL, VLDL, LDLCALC No results found for: TSH  Therapeutic Level Labs: No results found for: LITHIUM No results found for: VALPROATE No components found for:  CBMZ  Current Medications: Current Outpatient Medications  Medication Sig Dispense Refill  . Dulaglutide 0.75 MG/0.5ML SOPN Inject into the skin.    Marland Kitchen ACCU-CHEK AVIVA PLUS test strip     . acetaminophen (TYLENOL) 325 MG tablet Take 1 tablet (325 mg total) by mouth every 6 (six) hours as needed for mild pain (or Fever >/= 101).    Marland Kitchen aspirin EC 81 MG tablet Take 81 mg by mouth daily at 12 noon.    Marland Kitchen atorvastatin (LIPITOR) 40 MG tablet Take 40 mg by mouth at bedtime.     Marland Kitchen azelastine (ASTELIN) 0.1 % nasal spray Place 1 spray into both nostrils daily.    . busPIRone (BUSPAR) 10 MG tablet Take 2 tablets (20 mg total) by mouth 3 (three) times daily. 540 tablet 1  . dicyclomine (BENTYL) 10 MG capsule     . diphenhydrAMINE (BENADRYL ALLERGY) 25 MG tablet Take 1 tablet (25 mg total) by mouth 2 (two) times daily as needed for itching. For itching 60 tablet 1  . diphenoxylate-atropine (LOMOTIL) 2.5-0.025 MG tablet Take by mouth.    Derrill Memo ON 07/30/2018] gabapentin (NEURONTIN) 600 MG tablet Take 2 tablets (1,200 mg total) by mouth 3  (three) times daily. 180 tablet 2  . ipratropium (ATROVENT) 0.03 % nasal spray Place into the nose.    Marland Kitchen KLOR-CON M20 20 MEQ tablet Take 20 mEq by mouth daily.     Marland Kitchen lamoTRIgine (LAMICTAL) 100 MG tablet Take 1 tablet (100 mg total) by mouth daily. 90 tablet 0  . lamoTRIgine (LAMICTAL) 25 MG tablet Take 1 tablet (25 mg total) by mouth daily. To be combined with 100 mg 90 tablet 0  . Lancets (ACCU-CHEK SAFE-T PRO) lancets     . levocetirizine (XYZAL) 5 MG tablet     . levothyroxine (SYNTHROID, LEVOTHROID) 200 MCG tablet Take 200 mcg by mouth daily.     Derrill Memo ON 07/30/2018] Magnesium Oxide 500 MG CAPS Take 1 capsule (500 mg total) by mouth 2 (two) times daily at 8 am and 10 pm. 180 capsule 0  . meloxicam (MOBIC) 15 MG tablet Take 1 tablet (15 mg total) by mouth daily with breakfast. Max: 1/day 30 tablet 2  . metFORMIN (GLUCOPHAGE-XR) 500 MG 24 hr tablet Take 500 mg by mouth 2 (two) times a day.     Derrill Memo ON 07/30/2018] methocarbamol (ROBAXIN) 750 MG tablet Take 1 tablet (750 mg total) by mouth every 8 (eight) hours as needed for muscle spasms. 90 tablet 2  . montelukast (SINGULAIR) 10 MG tablet Take 10 mg by mouth daily.     Marland Kitchen MYRBETRIQ 50 MG TB24 tablet     . NICOTROL 10 MG inhaler     . nitroGLYCERIN (NITROSTAT) 0.4 MG SL tablet Place under the tongue.    Marland Kitchen omega-3 acid ethyl esters (LOVAZA) 1 g capsule     . omega-3 fish oil (MAXEPA) 1000 MG CAPS capsule     . omeprazole (PRILOSEC) 20 MG capsule Take 1 capsule (20 mg total) by mouth at bedtime. 30 capsule 2  . ondansetron (ZOFRAN-ODT) 8 MG disintegrating tablet Take 8 mg by mouth as needed.    . prochlorperazine (COMPAZINE) 10 MG tablet     .  promethazine (PHENERGAN) 25 MG tablet Take 1 tablet (25 mg total) by mouth every 6 (six) hours as needed for nausea or vomiting. 20 tablet 0  . propranolol (INDERAL) 20 MG tablet Take 1 tablet (20 mg total) by mouth 3 (three) times daily as needed. anxiety 90 tablet 1  . QUEtiapine (SEROQUEL) 100 MG  tablet Take 1.5 tablets (150 mg total) by mouth at bedtime. Patient supplies 135 tablet 0  . rizatriptan (MAXALT) 10 MG tablet Take 1 tablet (10 mg total) by mouth as needed. 10 tablet 2  . torsemide (DEMADEX) 20 MG tablet Take by mouth.    Derrill Memo ON 07/30/2018] traMADol (ULTRAM) 50 MG tablet Take 1 tablet (50 mg total) by mouth every 6 (six) hours as needed for severe pain. Max: 4/day 120 tablet 2  . TRELEGY ELLIPTA 100-62.5-25 MCG/INH AEPB     . VENTOLIN HFA 108 (90 Base) MCG/ACT inhaler      No current facility-administered medications for this visit.      Musculoskeletal: Strength & Muscle Tone: within normal limits Gait & Station: normal Patient leans: N/A  Psychiatric Specialty Exam: Review of Systems  Skin:       itching  Psychiatric/Behavioral: The patient is nervous/anxious and has insomnia.   All other systems reviewed and are negative.   There were no vitals taken for this visit.There is no height or weight on file to calculate BMI.  General Appearance: Casual  Eye Contact:  Fair  Speech:  Normal Rate  Volume:  Normal  Mood:  Anxious  Affect:  Congruent  Thought Process:  Goal Directed and Descriptions of Associations: Intact  Orientation:  Full (Time, Place, and Person)  Thought Content: Logical   Suicidal Thoughts:  No  Homicidal Thoughts:  No  Memory:  Immediate;   Fair Recent;   Fair Remote;   Fair  Judgement:  Fair  Insight:  Fair  Psychomotor Activity:  Normal  Concentration:  Concentration: Fair and Attention Span: Fair  Recall:  [employer] of Knowledge: Fair  Language: Fair  Akathisia:  No  Handed:  Right  AIMS (if indicated): denies tremors, rigidity  Assets:  Communication Skills Desire for Improvement Social Support  ADL's:  Intact  Cognition: WNL  Sleep:  restless   Screenings: PHQ2-9     Procedure visit from 04/22/2018 in [facility] Procedure visit from 03/13/2018 in [facility] Office Visit from 12/23/2017 in [facility] Procedure visit from 12/05/2017 in [facility] Office Visit from 11/13/2017 in [facility]  PHQ-2 Total Score  0  0  0  0  0       Assessment and Plan: Marie Clarke is a 48 year old Caucasian female who has a history of bipolar disorder, panic attacks, tobacco use disorder, multiple medical problems was evaluated by telemedicine today.  Patient with multiple physical problems as well as chronic pain.  Patient continues to struggle with severe anxiety as well as itching sensation on her skin likely due to anxiety.  Patient will benefit from medication readjustment.  Plan as noted below.  Plan Bipolar disorder- some progress Seroquel 150 mg p.o. daily Lamotrigine 125 mg p.o. daily BuSpar 20 mg p.o. 3 times daily.  For PTSD-some improvement She will continue psychotherapy sessions.  For panic attacks-unstable Increase propranolol to 20 mg p.o. 3 times daily. Will add Benadryl 25 mg p.o.  twice daily PRN for severe anxiety as well as itching.  For tobacco use disorder-unstable Provided smoking cessation counseling.  Patient is not ready to quit.  Follow-up in clinic in 3 to 4 weeks or sooner if needed.  Patient advised to call the clinic if symptoms worsen.  July 29 at 10:15 AM  I have spent atleast 15 minutes non face to face with patient today. More than 50 % of the time was spent for psychoeducation and supportive psychotherapy and care coordination.  This note was generated in part or whole with voice recognition software. Voice recognition is usually quite accurate but there are transcription errors that can and very often do occur. I apologize for any typographical errors that were not detected and corrected.          Ursula Alert, MD 07/24/2018, 12:46 PM

## 2018-07-25 ENCOUNTER — Ambulatory Visit: Payer: Medicare HMO | Admitting: Psychiatry

## 2018-07-28 ENCOUNTER — Encounter: Payer: Medicare HMO | Admitting: Pain Medicine

## 2018-07-28 ENCOUNTER — Encounter: Payer: Medicare HMO | Admitting: Nurse Practitioner

## 2018-07-29 ENCOUNTER — Encounter: Payer: Self-pay | Admitting: Pain Medicine

## 2018-07-30 ENCOUNTER — Encounter: Payer: Medicare HMO | Admitting: Pain Medicine

## 2018-07-31 ENCOUNTER — Encounter: Payer: Self-pay | Admitting: Pain Medicine

## 2018-08-03 NOTE — Progress Notes (Addendum)
Pain Management Virtual Encounter Note - Virtual Visit via Telephone Telehealth (real-time audio visits between healthcare provider and patient).   Patient's Phone No. & Preferred Pharmacy:  [phone] (home); [phone] (mobile); (Preferred) [phone] [email or web address]  [place] [address] [place] [address] Phone: [phone] Fax: [phone]    Pre-screening note:  Our staff contacted [name] and offered her an "in person", "face-to-face" appointment versus a telephone encounter. She indicated preferring the telephone encounter, at this time.   Reason for Virtual Visit: COVID-19*  Social distancing based on CDC and AMA recommendations.   I contacted [name] on 08/04/2018 via telephone. 47      I clearly identified myself as Gaspar Cola, MD. I verified that I was speaking with the correct person using two identifiers (Name: Terriah Reggio, and date of birth: 1970/10/03).  Advanced Informed Consent I sought verbal advanced consent from [facility] for virtual visit interactions. I informed [name] of possible security and privacy concerns, risks, and limitations associated with providing "not-in-person" medical evaluation and management services. I also informed [name] of the availability of "in-person" appointments. Finally, I informed her that there would be a charge for the virtual visit and that she could be  personally, fully or partially, financially responsible for it. Ms. Buhl expressed understanding and agreed to proceed.   Historic Elements   Ms. Kazue Cerro is a 48 y.o. year old, female patient evaluated today after her last encounter by our practice on 07/10/2018. Ms. Gatchel  has a past medical history of Anemia, Anxiety, Asthma, Bipolar disorder ([place]), CAD (coronary artery disease) ([place]), CHF (congestive heart failure) ([place]), COPD (chronic obstructive pulmonary disease) ([place]), Depression ([place]), Diabetes  mellitus without complication ([place]), Diabetes mellitus, type II ([place]), Drug overdose, GERD (gastroesophageal reflux disease), Headache, Hyperlipidemia, Hypertension, Left leg pain (04/29/2014), MI (myocardial infarction) ([place]), Muscle ache (09/16/2014), Osteoporosis, Overactive bladder, Pancreatitis ([place]), Reflex sympathetic dystrophy, Renal insufficiency, Sleep apnea, Sleep apnea, Stroke ([place]), Thyroid disease, TIA (transient ischemic attack) ([place]), and TIA (transient ischemic attack). She also  has a past surgical history that includes prolapse rectum surgery (N/A, July 2016); Abdominal hysterectomy; Tonsillectomy; and Hernia repair (07/15/2017). Ms. Alonge has a current medication list which includes the following prescription(s): accu-chek aviva plus, acetaminophen, aspirin ec, atorvastatin, azelastine, buspirone, dicyclomine, diphenhydramine, diphenoxylate-atropine, dulaglutide, gabapentin, ipratropium, klor-con m20, lamotrigine, lamotrigine, accu-chek safe-t pro, levocetirizine, levothyroxine, magnesium oxide, meloxicam, methocarbamol, montelukast, myrbetriq, nicotrol, nitroglycerin, omega-3 acid ethyl esters, omeprazole, ondansetron, prochlorperazine, propranolol, quetiapine, rizatriptan, torsemide, tramadol, trelegy ellipta, ventolin hfa, and pregabalin. She  reports that she has been smoking cigarettes. She has been smoking about 0.50 packs per day. She has never used smokeless tobacco. She reports that she does not drink alcohol or use drugs. Ms. Demattia is allergic to diazepam; levofloxacin; metronidazole; pregabalin; sulfa antibiotics; cephalexin; ciprofloxacin; ziprasidone hcl; and penicillins.   HPI  Today, she is being contacted for medication management.  Today I spoke to the patient and she indicates that the burning sensation in the low back it still present after the radiofrequency.  She indicates having burning sensation going all the way down into her left foot.  She denies any burning sensation into  the right leg or foot.  She indicates that the burning sensation is all over the foot and not located to any particular dermatomal area.  She is currently taking gabapentin 600 mg 2 tablets p.o. every 8 hours, and this does not seem to be helping the burning sensation.  This tells me that the patient has probably reached a plateau when he comes to the benefits of the gabapentin.  Because there is a dissociation between dose and benefit with the gabapentin, but it is not present during Lyrica treatment, I have asked the patient if she has tried the Lyrica before.  She indicates that it gave her upset stomach.  She has the Lyrica listed on her allergies as given her rash and hives, but this is not what she describes.  I questioned her about the Lyrica and whether or not it would be a good choice to give it a try and she indicated that she was interested in trying it again and see if she could tolerate it.  If this does not do the trick, then I will need to increase the patient's tramadol and go back to the gabapentin.  I will be calling the patient back in about 1 to 2 weeks to see how she is doing on the Lyrica and whether or not she can tolerate that medicine instead of the gabapentin.  The patient does have a diagnosis of a left lower extremity complex regional pain syndrome which could also explain what were looking at.  If she is not improved with the change in the medication, I will bring her back for a diagnostic left-sided lumbar sympathetic block under fluoroscopic guidance and IV sedation.  Pharmacotherapy Assessment  Analgesic: Tramadol 50 mg 1 tablet 4 times daily (fill date 06/30/2018) tramadol 200 mg/day MME/day:20mg /day   Monitoring: Pharmacotherapy: No side-effects or adverse reactions reported. [place] PMP: PDMP reviewed during this encounter.       Compliance: No problems identified. Effectiveness: Clinically acceptable. Plan: Refer to "POC".  Pertinent Labs   SAFETY SCREENING  Profile Lab Results  Component Value Date   HIV Non Reactive 09/18/2016   Renal Function Lab Results  Component Value Date   BUN 9 06/08/2018   CREATININE 0.73 06/08/2018   BCR 16 09/09/2017   GFRAA >60 06/08/2018   GFRNONAA >60 06/08/2018   Hepatic Function Lab Results  Component Value Date   AST 16 09/09/2017   ALT 14 08/10/2017   ALBUMIN 4.6 09/09/2017   UDS Summary  Date Value Ref Range Status  01/22/2018 FINAL  Final    Comment:    ==================================================================== TOXASSURE SELECT 13 (MW) ==================================================================== Test                             Result       Flag       Units Drug Present and Declared for Prescription Verification   Tramadol                       2742         EXPECTED   ng/mg creat   O-Desmethyltramadol            5630         EXPECTED   ng/mg creat   N-Desmethyltramadol            2591         EXPECTED   ng/mg creat    Source of tramadol is a prescription medication.    O-desmethyltramadol and N-desmethyltramadol are expected    metabolites of tramadol. ==================================================================== Test                      Result    Flag  Units      Ref Range   Creatinine              66               mg/dL      >=20 ==================================================================== Declared Medications:  The flagging and interpretation on this report are based on the  following declared medications.  Unexpected results may arise from  inaccuracies in the declared medications.  **Note: The testing scope of this panel includes these medications:  Tramadol  **Note: The testing scope of this panel does not include following  reported medications:  Acetaminophen  Aspirin (Aspirin 81)  Atorvastatin  Atropine (Lomotil)  Azelastine  Bumetanide  Buspirone  Diphenoxylate (Lomotil)  Fluticasone (Trelegy Ellipta)  Gabapentin  Hydroxyzine  (Vistaril)  Lamotrigine  Levocetirizine (Xyzal)  Levothyroxine  Magnesium Oxide  Metformin  Methocarbamol  Mirabegron (Myrbetriq)  Montelukast  Nicotine  Omega-3 Fatty Acids  Ondansetron  Potassium  Potassium (Klor-Con)  Promethazine  Quetiapine  Rizatriptan  Umeclidinium (Trelegy Ellipta)  Vilanterol (Trelegy Ellipta) ==================================================================== For clinical consultation, please call [phone]. ====================================================================    Note: Above Lab results reviewed.  Recent imaging  DG Chest Portable 1 View CLINICAL DATA:  Short of breath  EXAM: PORTABLE CHEST 1 VIEW  COMPARISON:  03/28/2018  FINDINGS: Borderline cardiomegaly. Diffuse bilateral interstitial opacity, slightly nodular in appearance. No pleural effusion. No pneumothorax.  IMPRESSION: Borderline cardiomegaly. Diffuse bilateral interstitial opacity with somewhat nodular appearance, possible interstitial inflammatory process or atypical pneumonia.  Electronically Signed   By: Donavan Foil M.D.   On: 06/08/2018 19:28  Assessment  The primary encounter diagnosis was Chronic pain syndrome. Diagnoses of Chronic low back pain (Primary Area of Pain) (Bilateral) (L>R), Chronic lower extremity pain (Referred) (Secondary Area of Pain) (Left), Chronic ankle pain (Tertiary Area of Pain) (Left), Chronic neck pain (Fourth Area of Pain) (Bilateral) (L>R), Gastroesophageal reflux disease without esophagitis, Long term current use of non-steroidal anti-inflammatories (NSAID), NSAID induced gastritis, Osteoarthritis involving multiple joints, Neurogenic pain, Neuropathic pain, and CRPS (complex regional pain syndrome) type 1 of lower limb (left ankle) were also pertinent to this visit.  Plan of Care  I have discontinued [facility] Kidney Lotter's promethazine, omega-3 fish oil, and metFORMIN. I have also changed her omeprazole and meloxicam. Additionally,  I am having her start on pregabalin. Lastly, I am having her maintain her atorvastatin, levothyroxine, montelukast, aspirin EC, azelastine, Klor-Con M20, acetaminophen, Trelegy Ellipta, Nicotrol, Myrbetriq, accu-chek safe-t pro, diphenoxylate-atropine, ondansetron, Ventolin HFA, Accu-Chek Aviva Plus, lamoTRIgine, rizatriptan, dicyclomine, ipratropium, levocetirizine, nitroGLYCERIN, omega-3 acid ethyl esters, prochlorperazine, torsemide, QUEtiapine, busPIRone, lamoTRIgine, methocarbamol, Magnesium Oxide, traMADol, gabapentin, Dulaglutide, propranolol, and diphenhydrAMINE.  Pharmacotherapy (Medications Ordered): Meds ordered this encounter  Medications  . omeprazole (PRILOSEC) 20 MG capsule    Sig: Take 1 capsule (20 mg total) by mouth at bedtime for 20 days.    Dispense:  20 capsule    Refill:  0    Fill one day early if pharmacy is closed on scheduled refill date. May substitute for generic if available. Please delete any other pending refills on prior prescriptions of this medication.  . meloxicam (MOBIC) 15 MG tablet    Sig: Take 1 tablet (15 mg total) by mouth daily with breakfast for 20 days. Max: 1/day    Dispense:  20 tablet    Refill:  0    Fill one day early if pharmacy is closed on scheduled refill date. May substitute for generic if available. Please delete any other pending  refills on prior prescriptions of this medication.  . pregabalin (LYRICA) 50 MG capsule    Sig: Take 1 capsule (50 mg total) by mouth 2 (two) times daily for 30 days.    Dispense:  60 capsule    Refill:  0    Fill one day early if pharmacy is closed on scheduled refill date. May substitute for generic if available.  Please instruct to stop immediately if any evidence of allergic reactions occur (rash, hives, etc.)   Orders:  Orders Placed This Encounter  Procedures  . LUMBAR SYMPATHETIC BLOCK    For sympathetically-mediated lower extremity pain.    Standing Status:   Standing    Number of Occurrences:   1     Standing Expiration Date:   08/04/2019    Scheduling Instructions:     Purpose: Diagnostic     Laterality: Left-sided     Level(s): Lumbar sympathetic chain (L3, L4)     Sedation: Sedation recommended.     TIMEFRAME: PRN procedure. (Ms. Lutter will call when needed.)    Order Specific Question:   Where will this procedure be performed?    Answer:   [facility]   Follow-up plan:   Return in about 2 weeks (around 08/18/2018) for follow-up on Lyrica trial, in addition, possible left-sided LSB, if no improvement with trial.    Recent Visits Date Type Provider Dept  07/10/18 Office Visit Milinda Pointer, MD [facility]  06/03/18 Office Visit Vevelyn Francois, NP [facility]  05/14/18 Office Visit Vevelyn Francois, NP [place] recent visits within past 90 days and meeting all other requirements   Today's Visits Date Type Provider Dept  08/04/18 Office Visit Milinda Pointer, MD [facility]  Showing today's visits and meeting all other requirements   Future Appointments Date Type Provider Dept  10/22/18 Appointment Milinda Pointer, MD [facility]  Showing future appointments within next 90 days and meeting all other requirements   I discussed the assessment and treatment plan with the patient. The patient was provided an opportunity to ask questions and all were answered. The patient agreed with the plan and demonstrated an understanding of the instructions.  Patient advised to call back or seek an in-person evaluation if the symptoms or condition worsens.  Total duration of non-face-to-face encounter: 15 minutes.  Note by: Gaspar Cola, MD Date: 08/04/2018; Time: 10:05 AM  Note: This dictation was prepared with Dragon dictation. Any transcriptional errors that may result from this process are unintentional.  Disclaimer:  * Given the special circumstances of the COVID-19 pandemic, the federal government  has announced that the Office for Civil Rights (OCR) will exercise its enforcement discretion and will not impose penalties on physicians using telehealth in the event of noncompliance with regulatory requirements under the [place] (HIPAA) in connection with the good faith provision of telehealth during the ZOXWR-60 national public health emergency. ([place])

## 2018-08-04 ENCOUNTER — Ambulatory Visit: Payer: Medicare HMO | Attending: Pain Medicine | Admitting: Pain Medicine

## 2018-08-04 ENCOUNTER — Other Ambulatory Visit: Payer: Self-pay

## 2018-08-04 DIAGNOSIS — M545 Low back pain, unspecified: Secondary | ICD-10-CM

## 2018-08-04 DIAGNOSIS — G894 Chronic pain syndrome: Secondary | ICD-10-CM

## 2018-08-04 DIAGNOSIS — M25572 Pain in left ankle and joints of left foot: Secondary | ICD-10-CM

## 2018-08-04 DIAGNOSIS — K219 Gastro-esophageal reflux disease without esophagitis: Secondary | ICD-10-CM

## 2018-08-04 DIAGNOSIS — M159 Polyosteoarthritis, unspecified: Secondary | ICD-10-CM

## 2018-08-04 DIAGNOSIS — Z791 Long term (current) use of non-steroidal anti-inflammatories (NSAID): Secondary | ICD-10-CM

## 2018-08-04 DIAGNOSIS — M79605 Pain in left leg: Secondary | ICD-10-CM

## 2018-08-04 DIAGNOSIS — K2960 Other gastritis without bleeding: Secondary | ICD-10-CM

## 2018-08-04 DIAGNOSIS — M150 Primary generalized (osteo)arthritis: Secondary | ICD-10-CM

## 2018-08-04 DIAGNOSIS — G8929 Other chronic pain: Secondary | ICD-10-CM

## 2018-08-04 DIAGNOSIS — M8949 Other hypertrophic osteoarthropathy, multiple sites: Secondary | ICD-10-CM

## 2018-08-04 DIAGNOSIS — M542 Cervicalgia: Secondary | ICD-10-CM

## 2018-08-04 DIAGNOSIS — M792 Neuralgia and neuritis, unspecified: Secondary | ICD-10-CM

## 2018-08-04 DIAGNOSIS — G90522 Complex regional pain syndrome I of left lower limb: Secondary | ICD-10-CM

## 2018-08-04 DIAGNOSIS — T39395A Adverse effect of other nonsteroidal anti-inflammatory drugs [NSAID], initial encounter: Secondary | ICD-10-CM

## 2018-08-04 MED ORDER — MELOXICAM 15 MG PO TABS: 15.0000 mg | ORAL_TABLET | Freq: Every day | ORAL | 0 refills | Status: DC

## 2018-08-04 MED ORDER — OMEPRAZOLE 20 MG PO CPDR: 20.0000 mg | DELAYED_RELEASE_CAPSULE | Freq: Every day | ORAL | 0 refills | Status: DC

## 2018-08-04 MED ORDER — PREGABALIN 50 MG PO CAPS: 50.0000 mg | ORAL_CAPSULE | Freq: Two times a day (BID) | ORAL | 0 refills | Status: DC

## 2018-08-04 NOTE — Patient Instructions (Signed)
Medication change: Decrease the gabapentin by 1 pill every 7 days.  Right away, go from 6 pills/day to 5 pills/day.  After 1 week go to 4 pills/day, followed by 3 pills/day after 7 days, and so on and so forth until having completely stopped the gabapentin.  At the same time, as you decrease the gabapentin, we will be increasing the Lyrica.  We will start the Lyrica at 50 mg p.o. twice daily and we will increase it by 1 pill every 7 days, as tolerated.  We will increase it to a maximum of 450 mg/day (total).

## 2018-08-05 ENCOUNTER — Other Ambulatory Visit: Payer: Self-pay | Admitting: Psychiatry

## 2018-08-05 DIAGNOSIS — T8130XA Disruption of wound, unspecified, initial encounter: Secondary | ICD-10-CM | POA: Insufficient documentation

## 2018-08-05 DIAGNOSIS — F3160 Bipolar disorder, current episode mixed, unspecified: Secondary | ICD-10-CM

## 2018-08-06 IMAGING — CT CT ABD-PELV W/ CM
2 of 5 series · 14 of 46 positions shown, 16 images · IV contrast (APPLIED)
Comparison: CT of the abdomen pelvis dated 08/01/2017

CLINICAL DATA: 46-year-old female with right lower quadrant
abdominal pain, nausea vomiting. Status post recent umbilical hernia
repair.

EXAM:
CT ABDOMEN AND PELVIS WITH CONTRAST
TECHNIQUE: Multidetector CT imaging of the abdomen and pelvis was performed
using the standard protocol following bolus administration of
intravenous contrast.
CONTRAST:  75mL OMNIPAQUE IOHEXOL 350 MG/ML SOLN

[Series 2: axial st · axial · 0.72mm/px · z∈[-906,-436]mm · 11 of 106 slices shown, 13 images]
[im 6/106  soft-tissue]
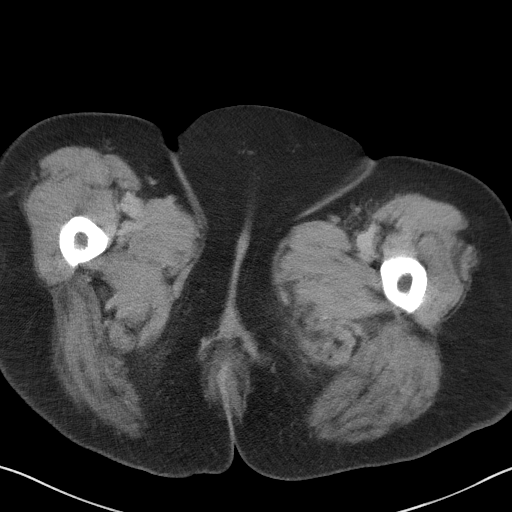
[im 6/106  bone]
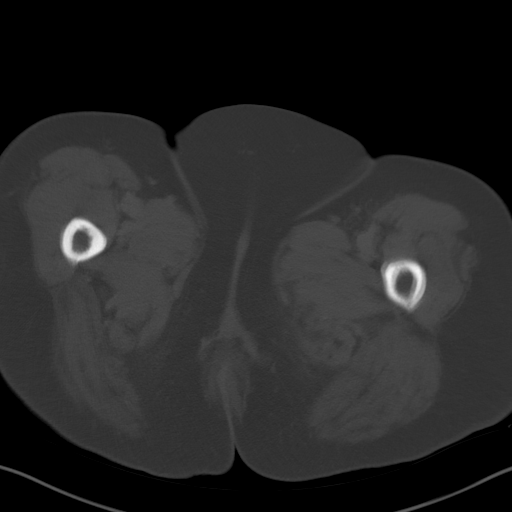
[im 16/106  soft-tissue]
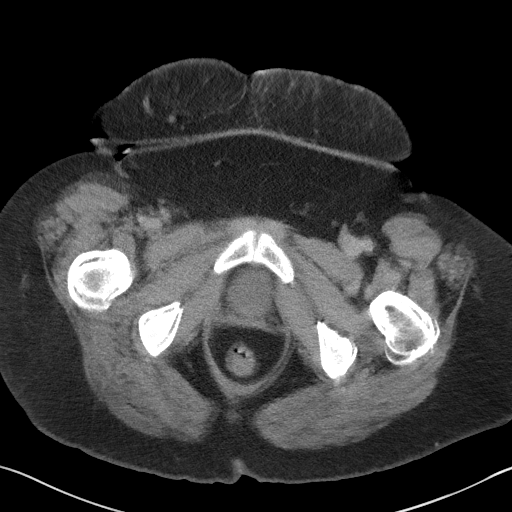
[im 27/106  soft-tissue]
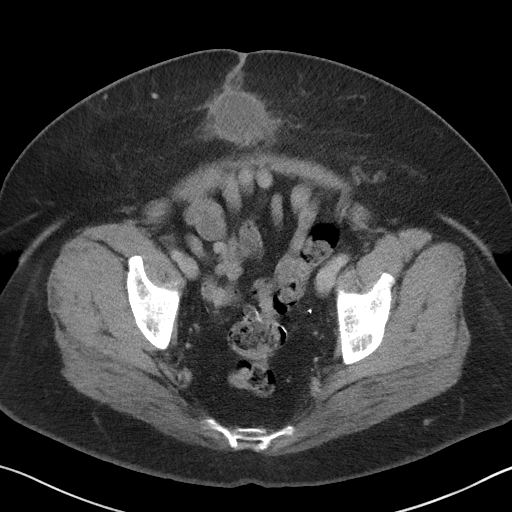
[im 37/106  soft-tissue]
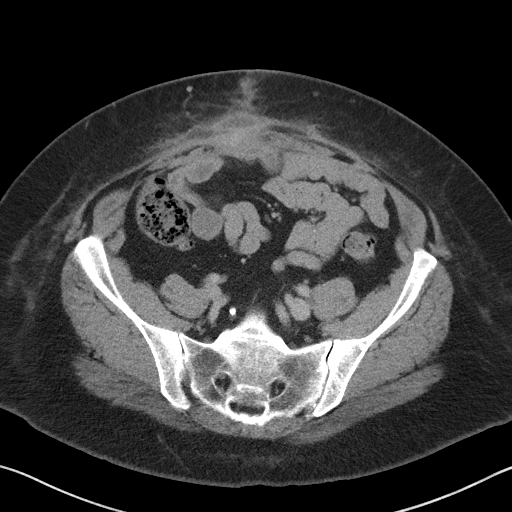
[im 43/106  soft-tissue]
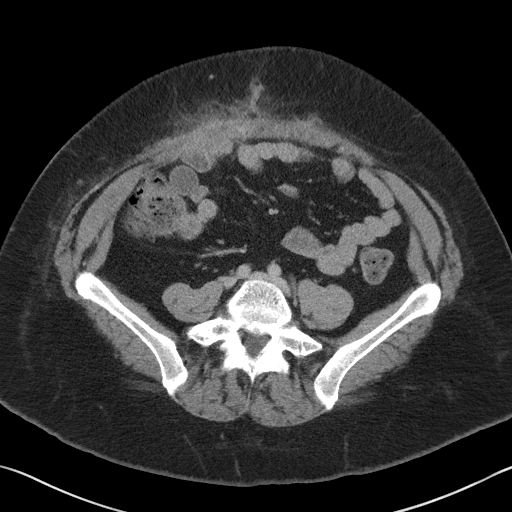
[im 53/106  soft-tissue]
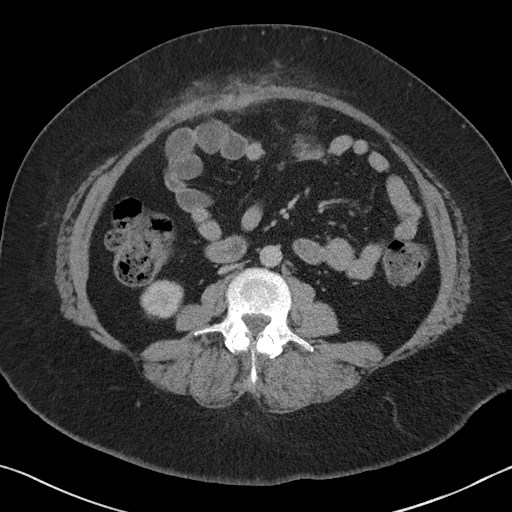
[im 64/106  soft-tissue]
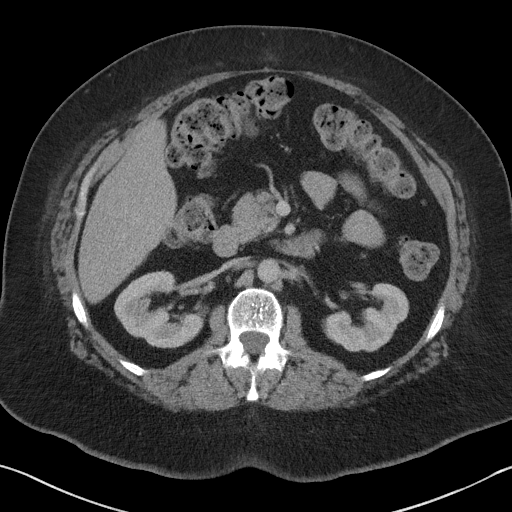
[im 69/106  soft-tissue]
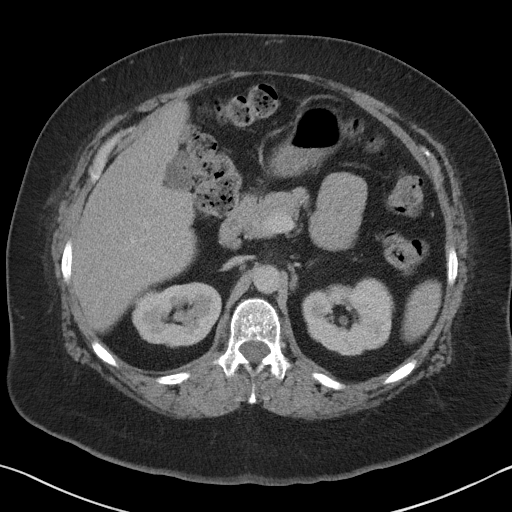
[im 79/106  soft-tissue]
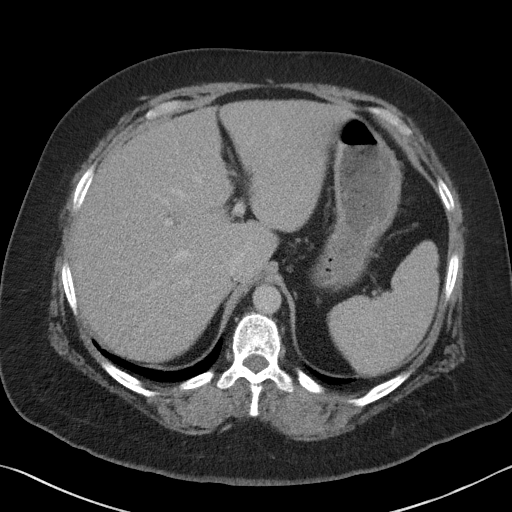
[im 79/106  bone]
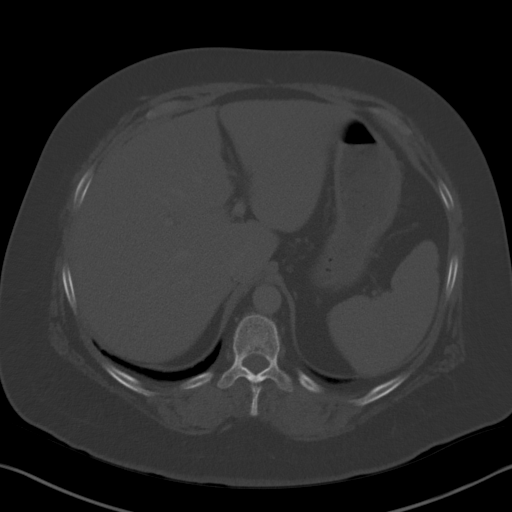
[im 90/106  soft-tissue]
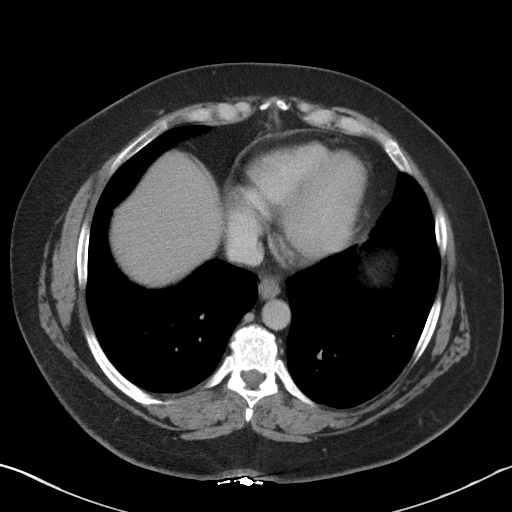
[im 100/106  soft-tissue]
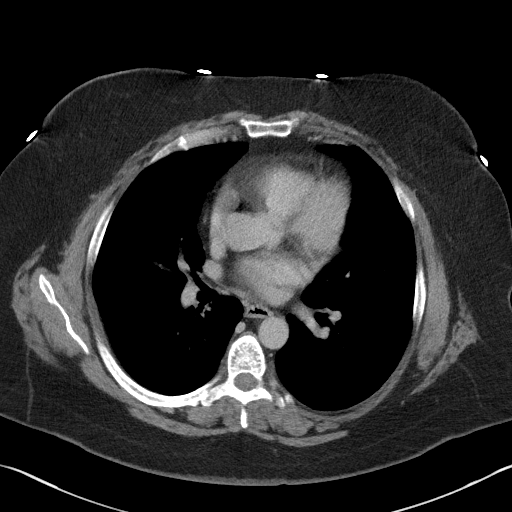

[Series 5: coronal st · coronal · 0.71mm/px · 3 of 101 slices shown]
[im 34/101  soft-tissue]
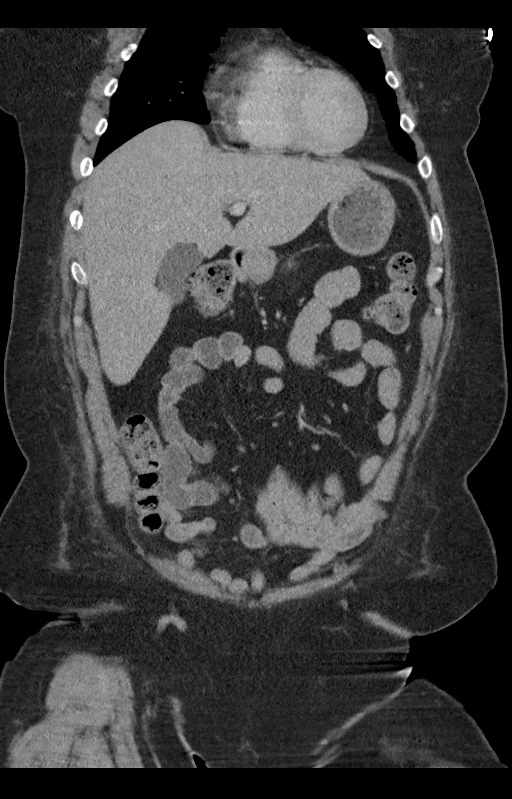
[im 45/101  soft-tissue]
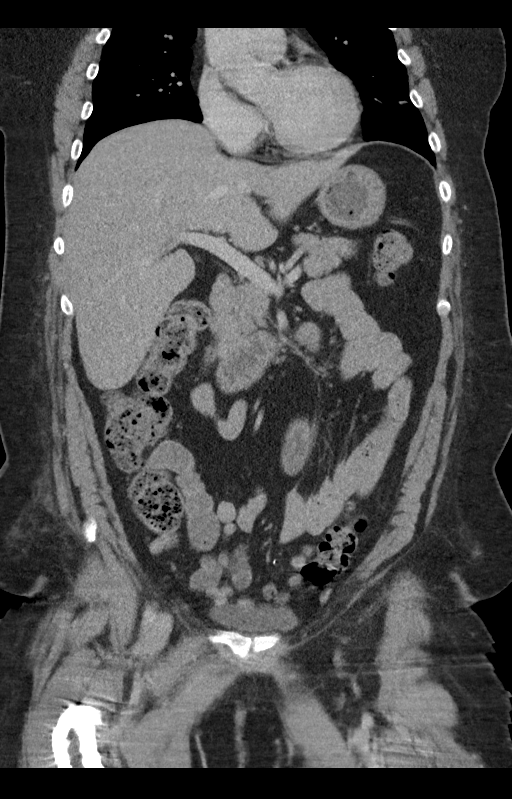
[im 56/101  soft-tissue]
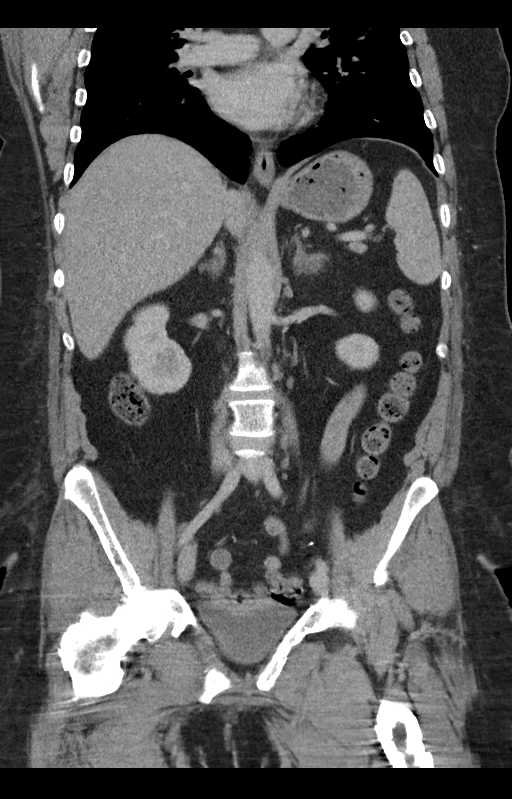

[14 of 46 positions shown; findings below may reference images not displayed]

FINDINGS: Lower chest: The visualized lung bases are clear.

No intra-abdominal free air or free fluid.

Hepatobiliary: No focal liver abnormality is seen. No gallstones,
gallbladder wall thickening, or biliary dilatation.

Pancreas: Unremarkable. No pancreatic ductal dilatation or
surrounding inflammatory changes.

Spleen: Normal in size without focal abnormality.

Adrenals/Urinary Tract: There is a 2 cm indeterminate left adrenal
nodule, likely an adenoma. The right adrenal gland is unremarkable.
Subcentimeter left renal upper pole hypodense lesion is not well
characterized but appears similar to prior CT studies, likely a
cyst. There is no hydronephrosis on either side. There is symmetric
enhancement of the kidneys by contrast. The visualized ureters and
urinary bladder appear unremarkable.

Stomach/Bowel: Postoperative changes of partial sigmoid resection
with anastomotic suture. There is abutment of loops of small bowel
to the ventral hernia repair mesh consistent with adhesions. There
is a focal area of abutment with slight 10 serration of the bowel
through the anterior lower abdominal fascia/peritoneal or mesh
(sagittal series 6 image [id number]) which may represent an area of
adhesion with a degree of pinching of the bowel. There is no bowel
obstruction or active inflammation. The appendix is normal.

Vascular/Lymphatic: No significant vascular findings are present. No
enlarged abdominal or pelvic lymph nodes.

Reproductive: Hysterectomy.  No pelvic mass.

Other: Postoperative changes of ventral hernia repair mesh and a
midline vertical anterior pelvic wall incisional scar. There is
inflammatory changes of the skin and subcutaneous fat in the
anterior pelvic wall and deep subcutaneous fat superficial to the
mesh. A 6 x 4 cm fluid collection anterior to the mesh in the
subcutaneous soft tissues appear somewhat similar or slightly
decreased in size compared to the prior CT and most likely
represents a seroma. Superimposed infection is not excluded.
Clinical correlation is recommended.

Musculoskeletal: No acute or significant osseous findings.
IMPRESSION: 1. Postsurgical changes of ventral hernia repair with mesh. There is
abutment of loops of small bowel to the lower anterior
peritoneum/mesh consistent with adhesions. Focal slightly more
protruded appearance of a loop of small bowel into the mesh in the
anterior pelvis also represent adhesion with possible associated
pinching of the anterior wall of the bowel loop. No evidence of
obstruction.
2. Inflammatory changes and stranding in the anterior pelvic wall
with a seroma anterior to the mesh. Findings although this findings
may be postoperative, superimposed infection is not excluded.
Clinical correlation is recommended.

## 2018-08-14 ENCOUNTER — Ambulatory Visit (INDEPENDENT_AMBULATORY_CARE_PROVIDER_SITE_OTHER): Payer: Medicare HMO | Admitting: Licensed Clinical Social Worker

## 2018-08-14 ENCOUNTER — Encounter: Payer: Self-pay | Admitting: Licensed Clinical Social Worker

## 2018-08-14 ENCOUNTER — Other Ambulatory Visit: Payer: Self-pay

## 2018-08-14 DIAGNOSIS — F4310 Post-traumatic stress disorder, unspecified: Secondary | ICD-10-CM | POA: Diagnosis not present

## 2018-08-14 DIAGNOSIS — F410 Panic disorder [episodic paroxysmal anxiety] without agoraphobia: Secondary | ICD-10-CM

## 2018-08-14 DIAGNOSIS — F3160 Bipolar disorder, current episode mixed, unspecified: Secondary | ICD-10-CM | POA: Diagnosis not present

## 2018-08-14 NOTE — Progress Notes (Signed)
Virtual Visit via Video Note  I connected with Marie Clarke on 08/14/18 at 11:00 AM EDT by a video enabled telemedicine application and verified that I am speaking with the correct person using two identifiers.   I discussed the limitations of evaluation and management by telemedicine and the availability of in person appointments. The patient expressed understanding and agreed to proceed.  I discussed the assessment and treatment plan with the patient. The patient was provided an opportunity to ask questions and all were answered. The patient agreed with the plan and demonstrated an understanding of the instructions.   The patient was advised to call back or seek an in-person evaluation if the symptoms worsen or if the condition fails to improve as anticipated.  I provided 15 minutes of non-face-to-face time during this encounter.   Alden Hipp, LCSW    THERAPIST PROGRESS NOTE  Session Time: 1100  Participation Level: Minimal  Behavioral Response: CasualAlertNA  Type of Therapy: Individual Therapy  Treatment Goals addressed: Coping  Interventions: Supportive  Summary: Sparkle Aube is a 48 y.o. female who presents with continued symptoms related to her diagnosis. Yazleen reports doing well since our last session, but noted she forgot about our appointment and did not have long to talk. LCSW encouraged Devanny to start writing down her appointments and putting them somewhere she will remember so she can best optimize her time in therapy. Kynisha expressed understanding and agreement. Shela reported she has started trying to stop smoking, and is down to one cigarette from her previous two packs a day. LCSW congratulated [place] and held space for Leon to discuss the difficulties around this situation. Congetta reported she has also gotten her "medication straightened out," which she was happy about as well. She reported her mood has been more stable and she is feeling better. Gayl expressed she did not have  anymore time to talk, so we ended the call.   Suicidal/Homicidal: No  Therapist Response: Zona continues to work towards her tx goals but has not yet reached them. We will continue to work on emotional regulation skills moving forward.   Plan: Return again in 4 weeks.  Diagnosis: Axis I: Post Traumatic Stress Disorder    Axis II: No diagnosis    Alden Hipp, LCSW 08/14/2018

## 2018-08-20 ENCOUNTER — Encounter: Payer: Self-pay | Admitting: Pain Medicine

## 2018-08-21 NOTE — Progress Notes (Signed)
Pain Management Virtual Encounter Note - Virtual Visit via Telephone Telehealth (real-time audio visits between healthcare provider and patient).   Patient's Phone No. & Preferred Pharmacy:  [phone] (home); [phone] (mobile); (Preferred) [phone] [email or web address]  [place], [place] [address] [place] [address] Phone: [phone] Fax: [phone]    Pre-screening note:  Our staff contacted [name] and offered her an "in person", "face-to-face" appointment versus a telephone encounter. She indicated preferring the telephone encounter, at this time.   Reason for Virtual Visit: COVID-19*  Social distancing based on CDC and AMA recommendations.   I contacted [name] on 08/25/2018 via telephone.      I clearly identified myself as Gaspar Cola, MD. I verified that I was speaking with the correct person using two identifiers (Name: Karinna Beadles, and date of birth: [birth year]).  Advanced Informed Consent I sought verbal advanced consent from [facility] for virtual visit interactions. I informed [name] of possible security and privacy concerns, risks, and limitations associated with providing "not-in-person" medical evaluation and management services. I also informed [name] of the availability of "in-person" appointments. Finally, I informed her that there would be a charge for the virtual visit and that she could be  personally, fully or partially, financially responsible for it. Ms. Pagel expressed understanding and agreed to proceed.   Historic Elements   Ms. Sedalia Greeson is a 48 y.o. year old, female patient evaluated today after her last encounter by our practice on 08/04/2018. Ms. Blando  has a past medical history of Anemia, Anxiety, Asthma, Bipolar disorder ([place]), CAD (coronary artery disease) ([place]), CHF (congestive heart failure) ([place]), COPD (chronic obstructive pulmonary disease) ([place]), Depression ([place]), Diabetes  mellitus without complication ([place]), Diabetes mellitus, type II ([place]), Drug overdose, GERD (gastroesophageal reflux disease), Headache, Hyperlipidemia, Hypertension, Left leg pain (04/29/2014), MI (myocardial infarction) ([place]), Muscle ache (09/16/2014), Osteoporosis, Overactive bladder, Pancreatitis ([place]), Reflex sympathetic dystrophy, Renal insufficiency, Sleep apnea, Sleep apnea, Stroke ([place]), Thyroid disease, TIA (transient ischemic attack) ([place]), and TIA (transient ischemic attack). She also  has a past surgical history that includes prolapse rectum surgery (N/A, July 2016); Abdominal hysterectomy; Tonsillectomy; and Hernia repair (07/15/2017). Ms. Dhami has a current medication list which includes the following prescription(s): accu-chek aviva plus, acetaminophen, aspirin ec, atorvastatin, azelastine, buspirone, dicyclomine, diphenhydramine, diphenoxylate-atropine, dulaglutide, ipratropium, klor-con m20, lamotrigine, lamotrigine, accu-chek safe-t pro, levocetirizine, levothyroxine, magnesium oxide, meloxicam, methocarbamol, montelukast, myrbetriq, nicotrol, nitroglycerin, omega-3 acid ethyl esters, omeprazole, ondansetron, pregabalin, prochlorperazine, propranolol, quetiapine, rizatriptan, torsemide, tramadol, trelegy ellipta, and ventolin hfa. She  reports that she has quit smoking. Her smoking use included cigarettes. She smoked 0.50 packs per day. She has never used smokeless tobacco. She reports that she does not drink alcohol or use drugs. Ms. Dietzman is allergic to diazepam; levofloxacin; metronidazole; pregabalin; sulfa antibiotics; cephalexin; ciprofloxacin; ziprasidone hcl; and penicillins.   HPI  Today, she is being contacted for medication management.  Today's appointment he is to discuss the results of her recent Lyrica trial.  As she moves onto Lyrica she was told to discontinue the gabapentin.  The patient has a history of a left lower extremity CRPS.  Previously we had agreed to consider doing a  left-sided lumbar sympathetic block should her Lyrica not help enough with the pain.  In reviewing today's chart, I noticed that the patient had been given a prescription for Maxalt by Dionisio David, NP, unfortunately I do not treat migraine headaches and therefore I will not be taking over  this medication.  Today, once more, the patient has indicated that she feels the tramadol 50 mg 1 tablet p.o. every 6 hours is not enough for her pain.  Unfortunately, the patient is high risk due to a prior history of suicidal attempt using medications.  She indicates her pain to be in the lower back and the left lower extremity with the lower back being worse than the leg pain.  She indicates the lower back pain to be bilateral with the left side being worse than the right.  She had bilateral lumbar facet radiofrequency ablation done on the left side on 03/13/2018 and on the right side on 04/22/2018.  She also has an MRI of the lumbar spine done on 01/18/2017 revealing an intervertebral disc protrusion at the L5-S1 level, bilaterally, with lateral recess stenosis.  Interestingly, she describes the left lower extremity pain as going all the way down into her foot, to the top of the foot, apparently following an L5 dermatomal distribution.  The pain goes all the way into the web between the big toe and the next toe.  In view of the above findings, today we will increase her Lyrica to 75 mg p.o. twice daily from 50 mg p.o. twice daily.  She has reported absolutely no side effects or adverse reactions to this medication.  I will continue her on 75 mg p.o. twice daily for 2 weeks after which we will then increase it to 75 mg p.o. 3 times daily and continue titrating it up to a possible maximum of 450 mg/day.  Pharmacotherapy Assessment  Analgesic: Tramadol 50 mg, 1 tab q 6 hrs (200 mg/day of tramadol) MME/day:20mg /day.   Monitoring: Pharmacotherapy: No side-effects or adverse reactions reported. [place] PMP: PDMP reviewed  during this encounter.       Compliance: No problems identified. Effectiveness: Clinically acceptable. Plan: Refer to "POC".  Pertinent Labs   SAFETY SCREENING Profile Lab Results  Component Value Date   HIV Non Reactive 09/18/2016   Renal Function Lab Results  Component Value Date   BUN 9 06/08/2018   CREATININE 0.73 06/08/2018   BCR 16 09/09/2017   GFRAA >60 06/08/2018   GFRNONAA >60 06/08/2018   Hepatic Function Lab Results  Component Value Date   AST 16 09/09/2017   ALT 14 08/10/2017   ALBUMIN 4.6 09/09/2017   UDS Summary  Date Value Ref Range Status  01/22/2018 FINAL  Final    Comment:    ==================================================================== TOXASSURE SELECT 13 (MW) ==================================================================== Test                             Result       Flag       Units Drug Present and Declared for Prescription Verification   Tramadol                       2742         EXPECTED   ng/mg creat   O-Desmethyltramadol            5630         EXPECTED   ng/mg creat   N-Desmethyltramadol            2591         EXPECTED   ng/mg creat    Source of tramadol is a prescription medication.    O-desmethyltramadol and N-desmethyltramadol are expected    metabolites of tramadol. ==================================================================== Test  Result    Flag   Units      Ref Range   Creatinine              66               mg/dL      >=20 ==================================================================== Declared Medications:  The flagging and interpretation on this report are based on the  following declared medications.  Unexpected results may arise from  inaccuracies in the declared medications.  **Note: The testing scope of this panel includes these medications:  Tramadol  **Note: The testing scope of this panel does not include following  reported medications:  Acetaminophen  Aspirin (Aspirin  81)  Atorvastatin  Atropine (Lomotil)  Azelastine  Bumetanide  Buspirone  Diphenoxylate (Lomotil)  Fluticasone (Trelegy Ellipta)  Gabapentin  Hydroxyzine (Vistaril)  Lamotrigine  Levocetirizine (Xyzal)  Levothyroxine  Magnesium Oxide  Metformin  Methocarbamol  Mirabegron (Myrbetriq)  Montelukast  Nicotine  Omega-3 Fatty Acids  Ondansetron  Potassium  Potassium (Klor-Con)  Promethazine  Quetiapine  Rizatriptan  Umeclidinium (Trelegy Ellipta)  Vilanterol (Trelegy Ellipta) ==================================================================== For clinical consultation, please call [phone]. ====================================================================    Note: Above Lab results reviewed.  Recent imaging  DG Chest Portable 1 View CLINICAL DATA:  Short of breath  EXAM: PORTABLE CHEST 1 VIEW  COMPARISON:  03/28/2018  FINDINGS: Borderline cardiomegaly. Diffuse bilateral interstitial opacity, slightly nodular in appearance. No pleural effusion. No pneumothorax.  IMPRESSION: Borderline cardiomegaly. Diffuse bilateral interstitial opacity with somewhat nodular appearance, possible interstitial inflammatory process or atypical pneumonia.  Electronically Signed   By: Donavan Foil M.D.   On: 06/08/2018 19:28  Assessment  The primary encounter diagnosis was Chronic pain syndrome. 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  Plan of Care  I have discontinued [facility] gabapentin. I have also changed her pregabalin. Additionally, I am having her maintain her atorvastatin, levothyroxine, montelukast, aspirin EC, azelastine, Klor-Con M20, acetaminophen, Trelegy Ellipta, Nicotrol, Myrbetriq, accu-chek safe-t pro, diphenoxylate-atropine, ondansetron, Ventolin HFA, Accu-Chek Aviva Plus, rizatriptan, dicyclomine, ipratropium, levocetirizine, nitroGLYCERIN, omega-3 acid ethyl esters, prochlorperazine, torsemide, QUEtiapine, busPIRone, methocarbamol, Magnesium Oxide, traMADol, Dulaglutide, propranolol, diphenhydrAMINE, omeprazole, meloxicam, lamoTRIgine, and lamoTRIgine.  Pharmacotherapy (Medications Ordered): Meds ordered this encounter  Medications  . pregabalin (LYRICA) 75 MG capsule    Sig: Take 1 capsule (75 mg total) by mouth 2 (two) times daily for 15 days, THEN 1 capsule (75 mg total) 3 (three) times daily for 15 days.    Dispense:  75 capsule    Refill:  0    Fill one day early if pharmacy is closed on scheduled refill date. May substitute for generic if available.   Orders:  Orders Placed This Encounter  Procedures  . Lumbar Epidural Injection    Standing Status:   Future    Standing Expiration Date:   09/25/2018    Scheduling Instructions:     Procedure: Interlaminar Lumbar Epidural Steroid injection (LESI)  L4-5     Laterality: Right-sided  Sedation: With Sedation.     Timeframe: ASAA    Order Specific Question:   Where will this procedure be performed?    Answer:   [facility]  . Lumbar Transforaminal Epidural    Standing Status:   Future    Standing Expiration Date:   09/25/2018    Scheduling Instructions:     Side: Left-sided     Level: L5     Sedation: With Sedation.     Timeframe: ASAA    Order Specific Question:   Where will this procedure be performed?    Answer:   [facility]   Follow-up plan:    Return for Procedure (w/ sedation): (R) L4-5 LESI #1 + (L) L5 TFESI #1.      Interventional management options: Planned, scheduled, and/or pending:   Therapeutic bilateral lumbar facet RFA #1 under fluoroscopic guidance and IV sedation, starting with the left side.   Considering:   Diagnosticbilateral lumbar facet nerve block Possible bilateral lumbar facetRFA Diagnostic left-sided L5-S1 transforaminal ESI Diagnostic left-sided L5 selective nerve root block Possible left-sided L5 DRG RFA Diagnostic left-sidedlumbarsympathetic nerve block Possible left-sided lumbar sympathetic RFA Diagnostic bilateral cervical facet nerve block Possible bilateral cervical facetRFA Diagnosticleft intra-articular shoulder injection Diagnosticleft suprascapular nerve block Diagnostic left suprascapularRFA Possible lumbar spinal cord stimulator trial   Palliative PRN treatment(s):   Palliative bilateral lumbar facet blocks Palliative right-sided lumbar facet RFA #2 (last done 04/22/2018) Palliative left-sided lumbar facet RFA #2 (last done 03/13/2018)    Recent Visits Date Type Provider Dept  08/04/18 Office Visit Milinda Pointer, [facility]  07/10/18 Office Visit Milinda Pointer, MD [facility]  06/03/18 Office Visit Vevelyn Francois, NP [facility]  Showing recent visits within past 90 days and meeting all other requirements   Today's Visits Date Type Provider Dept  08/25/18 Office Visit Milinda Pointer, MD [facility]  Showing today's visits and meeting all other requirements   Future Appointments Date Type Provider Dept  10/22/18 Appointment Milinda Pointer, MD [facility]  Showing future appointments within next 90 days and meeting all other requirements   I discussed the assessment and treatment plan with the patient. The patient was provided an opportunity to ask questions and all were answered. The  patient agreed with the plan and demonstrated an understanding of the instructions.  Patient advised to call back or seek an in-person evaluation if the symptoms or condition worsens.  Total duration of non-face-to-face encounter: 15 minutes.  Note by: Gaspar Cola, MD Date: 08/25/2018; Time: 9:34 AM  Note: This dictation was prepared with Dragon dictation. Any transcriptional errors that may result from this process are unintentional.  Disclaimer:  * Given the special circumstances of the COVID-19 pandemic, the federal government has announced that the Office for Civil Rights (OCR) will exercise its enforcement discretion and will not impose penalties on physicians using telehealth in the event of noncompliance with regulatory requirements under the [place] (HIPAA) in connection with the good faith provision of telehealth during the BWLSL-37 national public health emergency. ([place])

## 2018-08-25 ENCOUNTER — Other Ambulatory Visit: Payer: Self-pay

## 2018-08-25 ENCOUNTER — Ambulatory Visit: Payer: Medicare HMO | Attending: Pain Medicine | Admitting: Pain Medicine

## 2018-08-25 DIAGNOSIS — G8929 Other chronic pain: Secondary | ICD-10-CM

## 2018-08-25 DIAGNOSIS — M542 Cervicalgia: Secondary | ICD-10-CM

## 2018-08-25 DIAGNOSIS — M545 Low back pain, unspecified: Secondary | ICD-10-CM

## 2018-08-25 DIAGNOSIS — M792 Neuralgia and neuritis, unspecified: Secondary | ICD-10-CM

## 2018-08-25 DIAGNOSIS — G894 Chronic pain syndrome: Secondary | ICD-10-CM | POA: Diagnosis not present

## 2018-08-25 DIAGNOSIS — M48061 Spinal stenosis, lumbar region without neurogenic claudication: Secondary | ICD-10-CM | POA: Insufficient documentation

## 2018-08-25 DIAGNOSIS — G90522 Complex regional pain syndrome I of left lower limb: Secondary | ICD-10-CM

## 2018-08-25 DIAGNOSIS — M79605 Pain in left leg: Secondary | ICD-10-CM | POA: Diagnosis not present

## 2018-08-25 DIAGNOSIS — M25512 Pain in left shoulder: Secondary | ICD-10-CM

## 2018-08-25 DIAGNOSIS — M25572 Pain in left ankle and joints of left foot: Secondary | ICD-10-CM | POA: Diagnosis not present

## 2018-08-25 DIAGNOSIS — M25511 Pain in right shoulder: Secondary | ICD-10-CM

## 2018-08-25 DIAGNOSIS — M47816 Spondylosis without myelopathy or radiculopathy, lumbar region: Secondary | ICD-10-CM

## 2018-08-25 DIAGNOSIS — M5127 Other intervertebral disc displacement, lumbosacral region: Secondary | ICD-10-CM | POA: Insufficient documentation

## 2018-08-25 MED ORDER — PREGABALIN 75 MG PO CAPS: ORAL_CAPSULE | ORAL | 0 refills | Status: DC

## 2018-08-25 NOTE — Patient Instructions (Signed)
____________________________________________________________________________________________  Medication Evaluation  Purpose: The purpose of these questions is to establish the onset of effects (speed of absorption), peak benefit (effectiveness), side-effects (ability to tolerate), and duration (excretion/metabolism) of the prescribed medication. The results will help the healthcare provider decide what to do with the medication in question.  Please indicate:  1. Time to onset of benefits: The amount of time it takes for you to begin perceiving any benefits after taking (swallowing) your pain medicine. _______ minutes.  2. Time to peak effect: The time it takes between taking medicine (swallowing it) and the moment when you feel the most benefit (peak effect) from the medicine. _______ minutes.  3. Peak benefit: Please quantify the amount of relief you obtain from the medicine at the moment it is working the best. Does you pain completely go away (100% gone)? Is it three quarters (3/4) better (75% relief)? Does half of your pain go away (50% benefit)? Is it a third better (33% improved)? Or does it go down only by a fourth (25% better)? _______ % relief.  4. Duration of benefit: The time it takes for all benefits of the medicine to be gone. This period of time starts when you first swallow your pain pill and ends when you feel that your pain has increased to the point where you absolutely need to take another pill. _______ hours and _______ minutes.  5. Adverse reactions: These are side effects and/or adverse reactions you experience since taking the medicine or only when taking your pain medicine. Please circle any and all that apply:  a. Allergic reactions: itching; hives; generalized redness; swelling of the tongue; swelling of the eyes; difficulty breathing. b. Neurological Intolerance: cognitive impairment (difficulty thinking clearly; memory problems; difficulty remembering things; slurred  speech); oversedation; sleepiness: unsteadiness; difficulty walking. c. Gastrointestinal problems: constipation; nausea; vomiting; dry heaves; decreased appetite. d. Hormonal problems: decreased sex drive; erection problems; abnormal menstrual period; weight gain or inability to lose weight; weakness or low energy; hair loss.  What to do: When completed, please bring back to your next visit. Please give it to the admitting nurse.  (Last updated: 06/26/2018) ____________________________________________________________________________________________   ____________________________________________________________________________________________  Preparing for Procedure with Sedation  Procedure appointments are limited to planned procedures: . No Prescription Refills. . No disability issues will be discussed. . No medication changes will be discussed.  Instructions: . Oral Intake: Do not eat or drink anything for at least 8 hours prior to your procedure. . Transportation: Public transportation is not allowed. Bring an adult driver. The driver must be physically present in our waiting room before any procedure can be started. [place] Kitchen Physical Assistance: Bring an adult physically capable of assisting you, in the event you need help. This adult should keep you company at home for at least 6 hours after the procedure. . Blood Pressure Medicine: Take your blood pressure medicine with a sip of water the morning of the procedure. . Blood thinners: Notify our staff if you are taking any blood thinners. Depending on which one you take, there will be specific instructions on how and when to stop it. . Diabetics on insulin: Notify the staff so that you can be scheduled 1st case in the morning. If your diabetes requires high dose insulin, take only  of your normal insulin dose the morning of the procedure and notify the staff that you have done so. . Preventing infections: Shower with an antibacterial soap the  morning of your procedure. . Build-up your immune system: Take 1000  mg of Vitamin C with every meal (3 times a day) the day prior to your procedure. Marland Kitchen Antibiotics: Inform the staff if you have a condition or reason that requires you to take antibiotics before dental procedures. . Pregnancy: If you are pregnant, call and cancel the procedure. . Sickness: If you have a cold, fever, or any active infections, call and cancel the procedure. . Arrival: You must be in the facility at least 30 minutes prior to your scheduled procedure. . Children: Do not bring children with you. . Dress appropriately: Bring dark clothing that you would not mind if they get stained. . Valuables: Do not bring any jewelry or valuables.  Reasons to call and reschedule or cancel your procedure: (Following these recommendations will minimize the risk of a serious complication.) . Surgeries: Avoid having procedures within 2 weeks of any surgery. (Avoid for 2 weeks before or after any surgery). . Flu Shots: Avoid having procedures within 2 weeks of a flu shots or . (Avoid for 2 weeks before or after immunizations). . Barium: Avoid having a procedure within 7-10 days after having had a radiological study involving the use of radiological contrast. (Myelograms, Barium swallow or enema study). . Heart attacks: Avoid any elective procedures or surgeries for the initial 6 months after a "Myocardial Infarction" (Heart Attack). . Blood thinners: It is imperative that you stop these medications before procedures. Let us know if you if you take any blood thinner.  . Infection: Avoid procedures during or within two weeks of an infection (including chest colds or gastrointestinal problems). Symptoms associated with infections include: Localized redness, fever, chills, night sweats or profuse sweating, burning sensation when voiding, cough, congestion, stuffiness, runny nose, sore throat, diarrhea, nausea, vomiting, cold or Flu symptoms, recent  or current infections. It is specially important if the infection is over the area that we intend to treat. Marland Kitchen Heart and lung problems: Symptoms that may suggest an active cardiopulmonary problem include: cough, chest pain, breathing difficulties or shortness of breath, dizziness, ankle swelling, uncontrolled high or unusually low blood pressure, and/or palpitations. If you are experiencing any of these symptoms, cancel your procedure and contact your primary care physician for an evaluation.  Remember:  Regular Business hours are:  Monday to Thursday 8:00 AM to 4:00 PM  Provider's Schedule: Milinda Pointer, MD:  Procedure days: Tuesday and Thursday 7:30 AM to 4:00 PM  Gillis Santa, MD:  Procedure days: Monday and Wednesday 7:30 AM to 4:00 PM ____________________________________________________________________________________________

## 2018-08-27 ENCOUNTER — Ambulatory Visit: Payer: Medicare HMO | Admitting: Psychiatry

## 2018-09-04 ENCOUNTER — Ambulatory Visit (HOSPITAL_BASED_OUTPATIENT_CLINIC_OR_DEPARTMENT_OTHER): Payer: Medicare HMO | Admitting: Pain Medicine

## 2018-09-04 ENCOUNTER — Other Ambulatory Visit: Payer: Self-pay

## 2018-09-04 ENCOUNTER — Ambulatory Visit
Admission: RE | Admit: 2018-09-04 | Discharge: 2018-09-04 | Disposition: A | Discharge status: Home / Self Care | Payer: Medicare HMO | Source: Ambulatory Visit | Attending: Pain Medicine | Admitting: Pain Medicine

## 2018-09-04 ENCOUNTER — Encounter: Payer: Self-pay | Admitting: Pain Medicine

## 2018-09-04 VITALS — BP 108/72 | HR 81 | Temp 97.9°F | Resp 16 | Ht 64.0 in | Wt 237.0 lb

## 2018-09-04 DIAGNOSIS — M48061 Spinal stenosis, lumbar region without neurogenic claudication: Secondary | ICD-10-CM

## 2018-09-04 DIAGNOSIS — M545 Low back pain, unspecified: Secondary | ICD-10-CM

## 2018-09-04 DIAGNOSIS — G8929 Other chronic pain: Secondary | ICD-10-CM | POA: Diagnosis present

## 2018-09-04 DIAGNOSIS — M5127 Other intervertebral disc displacement, lumbosacral region: Secondary | ICD-10-CM | POA: Insufficient documentation

## 2018-09-04 DIAGNOSIS — M79605 Pain in left leg: Secondary | ICD-10-CM | POA: Diagnosis not present

## 2018-09-04 DIAGNOSIS — M5137 Other intervertebral disc degeneration, lumbosacral region: Secondary | ICD-10-CM

## 2018-09-04 MED ORDER — DEXAMETHASONE SODIUM PHOSPHATE 10 MG/ML IJ SOLN
10.0000 mg | Freq: Once | INTRAMUSCULAR | Status: AC
  Administered 2018-09-04: 10 mg
  Filled 2018-09-04: qty 1

## 2018-09-04 MED ORDER — SODIUM CHLORIDE 0.9% FLUSH
1.0000 mL | Freq: Once | INTRAVENOUS | Status: AC
  Administered 2018-09-04: 09:00:00 1 mL

## 2018-09-04 MED ORDER — LIDOCAINE HCL 2 % IJ SOLN
20.0000 mL | Freq: Once | INTRAMUSCULAR | Status: AC
  Administered 2018-09-04: 400 mg
  Filled 2018-09-04: qty 40

## 2018-09-04 MED ORDER — LACTATED RINGERS IV SOLN
1000.0000 mL | Freq: Once | INTRAVENOUS | Status: AC
  Administered 2018-09-04: 09:00:00 1000 mL via INTRAVENOUS

## 2018-09-04 MED ORDER — TRIAMCINOLONE ACETONIDE 40 MG/ML IJ SUSP
40.0000 mg | Freq: Once | INTRAMUSCULAR | Status: AC
  Administered 2018-09-04: 09:00:00 40 mg
  Filled 2018-09-04: qty 1

## 2018-09-04 MED ORDER — SODIUM CHLORIDE 0.9% FLUSH
2.0000 mL | Freq: Once | INTRAVENOUS | Status: AC
  Administered 2018-09-04: 09:00:00 2 mL

## 2018-09-04 MED ORDER — ROPIVACAINE HCL 2 MG/ML IJ SOLN
1.0000 mL | Freq: Once | INTRAMUSCULAR | Status: AC
  Administered 2018-09-04: 1 mL via EPIDURAL
  Filled 2018-09-04: qty 10

## 2018-09-04 MED ORDER — ROPIVACAINE HCL 2 MG/ML IJ SOLN
2.0000 mL | Freq: Once | INTRAMUSCULAR | Status: AC
  Administered 2018-09-04: 2 mL via EPIDURAL
  Filled 2018-09-04: qty 10

## 2018-09-04 MED ORDER — IOHEXOL 180 MG/ML  SOLN
10.0000 mL | Freq: Once | INTRAMUSCULAR | Status: AC
  Administered 2018-09-04: 09:00:00 10 mL via EPIDURAL

## 2018-09-04 MED ORDER — FENTANYL CITRATE (PF) 100 MCG/2ML IJ SOLN
25.0000 ug | INTRAMUSCULAR | Status: DC | PRN
  Administered 2018-09-04: 50 ug via INTRAVENOUS
  Filled 2018-09-04: qty 2

## 2018-09-04 MED ORDER — MIDAZOLAM HCL 5 MG/5ML IJ SOLN
1.0000 mg | INTRAMUSCULAR | Status: DC | PRN
  Administered 2018-09-04: 09:00:00 2 mg via INTRAVENOUS
  Filled 2018-09-04: qty 5

## 2018-09-04 NOTE — Patient Instructions (Addendum)
____________________________________________________________________________________________  Post-Procedure Discharge Instructions  Instructions:  Apply ice:   Purpose: This will minimize any swelling and discomfort after procedure.   When: Day of procedure, as soon as you get home.  How: Fill a plastic sandwich bag with crushed ice. Cover it with a small towel and apply to injection site.  How long: (15 min on, 15 min off) Apply for 15 minutes then remove x 15 minutes.  Repeat sequence on day of procedure, until you go to bed.  Apply heat:   Purpose: To treat any soreness and discomfort from the procedure.  When: Starting the next day after the procedure.  How: Apply heat to procedure site starting the day following the procedure.  How long: May continue to repeat daily, until discomfort goes away.  Food intake: Start with clear liquids (like water) and advance to regular food, as tolerated.   Physical activities: Keep activities to a minimum for the first 8 hours after the procedure. After that, then as tolerated.  Driving: If you have received any sedation, be responsible and do not drive. You are not allowed to drive for 24 hours after having sedation.  Blood thinner: (Applies only to those taking blood thinners) You may restart your blood thinner 6 hours after your procedure.  Insulin: (Applies only to Diabetic patients taking insulin) As soon as you can eat, you may resume your normal dosing schedule.  Infection prevention: Keep procedure site clean and dry. Shower daily and clean area with soap and water.  Post-procedure Pain Diary: Extremely important that this be done correctly and accurately. Recorded information will be used to determine the next step in treatment. For the purpose of accuracy, follow these rules:  Evaluate only the area treated. Do not report or include pain from an untreated area. For the purpose of this evaluation, ignore all other areas of pain,  except for the treated area.  After your procedure, avoid taking a long nap and attempting to complete the pain diary after you wake up. Instead, set your alarm clock to go off every hour, on the hour, for the initial 8 hours after the procedure. Document the duration of the numbing medicine, and the relief you are getting from it.  Do not go to sleep and attempt to complete it later. It will not be accurate. If you received sedation, it is likely that you were given a medication that may cause amnesia. Because of this, completing the diary at a later time may cause the information to be inaccurate. This information is needed to plan your care.  Follow-up appointment: Keep your post-procedure follow-up evaluation appointment after the procedure (usually 2 weeks for most procedures, 6 weeks for radiofrequencies). DO NOT FORGET to bring you pain diary with you.   Expect: (What should I expect to see with my procedure?)  From numbing medicine (AKA: Local Anesthetics): Numbness or decrease in pain. You may also experience some weakness, which if present, could last for the duration of the local anesthetic.  Onset: Full effect within 15 minutes of injected.  Duration: It will depend on the type of local anesthetic used. On the average, 1 to 8 hours.   From steroids (Applies only if steroids were used): Decrease in swelling or inflammation. Once inflammation is improved, relief of the pain will follow.  Onset of benefits: Depends on the amount of swelling present. The more swelling, the longer it will take for the benefits to be seen. In some cases, up to 10 days.    Duration: Steroids will stay in the system x 2 weeks. Duration of benefits will depend on multiple posibilities including persistent irritating factors.  Side-effects: If present, they may typically last 2 weeks (the duration of the steroids).  Frequent: Cramps (if they occur, drink Gatorade and take over-the-counter Magnesium 450-500 mg  once to twice a day); water retention with temporary weight gain; increases in blood sugar; decreased immune system response; increased appetite.  Occasional: Facial flushing (red, warm cheeks); mood swings; menstrual changes.  Uncommon: Long-term decrease or suppression of natural hormones; bone thinning. (These are more common with higher doses or more frequent use. This is why we prefer that our patients avoid having any injection therapies in other practices.)   Very Rare: Severe mood changes; psychosis; aseptic necrosis.  From procedure: Some discomfort is to be expected once the numbing medicine wears off. This should be minimal if ice and heat are applied as instructed.  Call if: (When should I call?)  You experience numbness and weakness that gets worse with time, as opposed to wearing off.  New onset bowel or bladder incontinence. (Applies only to procedures done in the spine)  Emergency Numbers:  Durning business hours (Monday - Thursday, 8:00 AM - 4:00 PM) (Friday, 9:00 AM - 12:00 Noon): [phone]  After hours: [phone]  NOTE: If you are having a problem and are unable connect with, or to talk to a provider, then go to your nearest urgent care or emergency department. If the problem is serious and urgent, please call 911. ____________________________________________________________________________________________    ______________________________________________________________________________________________  Specialty Pain Scale  Introduction:  There are significant differences in how pain is reported. The word pain usually refers to physical pain, but it is also a common synonym of suffering. The medical community uses a scale from 0 (zero) to 10 (ten) to report pain level. Zero (0) is described as "no pain", while ten (10) is described as "the worse pain you can imagine". The problem with this scale is that physical pain is reported along with suffering.  Suffering refers to mental pain, or more often yet it refers to any unpleasant feeling, emotion or aversion associated with the perception of harm or threat of harm. It is the psychological component of pain.  Pain Specialists prefer to separate the two components. The pain scale used by this practice is the Verbal Numerical Rating Scale (VNRS-11). This scale is for the physical pain only. DO NOT INCLUDE how your pain psychologically affects you. This scale is for adults 21 years of age and older. It has 11 (eleven) levels. The 1st level is 0/10. This means: "right now, I have no pain". In the context of pain management, it also means: "right now, my physical pain is under control with the current therapy".  General Information:  The scale should reflect your current level of pain. Unless you are specifically asked for the level of your worst pain, or your average pain. If you are asked for one of these two, then it should be understood that it is over the past 24 hours.  Levels 1 (one) through 5 (five) are described below, and can be treated as an outpatient. Ambulatory pain management facilities such as ours are more than adequate to treat these levels. Levels 6 (six) through 10 (ten) are also described below, however, these must be treated as a hospitalized patient. While levels 6 (six) and 7 (seven) may be evaluated at an urgent care facility, levels 8 (eight) through 10 (ten)   constitute medical emergencies and as such, they belong in a hospital's emergency department. When having these levels (as described below), do not come to our office. Our facility is not equipped to manage these levels. Go directly to an urgent care facility or an emergency department to be evaluated.  Definitions:  Activities of Daily Living (ADL): Activities of daily living (ADL or ADLs) is a term used in healthcare to refer to people's daily self-care activities. Health professionals often use a person's ability or inability  to perform ADLs as a measurement of their functional status, particularly in regard to people post injury, with disabilities and the elderly. There are two ADL levels: Basic and Instrumental. Basic Activities of Daily Living (BADL  or BADLs) consist of self-care tasks that include: Bathing and showering; personal hygiene and grooming (including brushing/combing/styling hair); dressing; Toilet hygiene (getting to the toilet, cleaning oneself, and getting back up); eating and self-feeding (not including cooking or chewing and swallowing); functional mobility, often referred to as "transferring", as measured by the ability to walk, get in and out of bed, and get into and out of a chair; the broader definition (moving from one place to another while performing activities) is useful for people with different physical abilities who are still able to get around independently. Basic ADLs include the things many people do when they get up in the morning and get ready to go out of the house: get out of bed, go to the toilet, bathe, dress, groom, and eat. On the average, loss of function typically follows a particular order. Hygiene is the first to go, followed by loss of toilet use and locomotion. The last to go is the ability to eat. When there is only one remaining area in which the person is independent, there is a 62.9% chance that it is eating and only a 3.5% chance that it is hygiene. Instrumental Activities of Daily Living (IADL or IADLs) are not necessary for fundamental functioning, but they let an individual live independently in a community. IADL consist of tasks that include: cleaning and maintaining the house; home establishment and maintenance; care of others (including selecting and supervising caregivers); care of pets; child rearing; managing money; managing financials (investments, etc.); meal preparation and cleanup; shopping for groceries and necessities; moving within the community; safety procedures  and emergency responses; health management and maintenance (taking prescribed medications); and using the telephone or other form of communication.  Instructions:  Most patients tend to report their pain as a combination of two factors, their physical pain and their psychosocial pain. This last one is also known as "suffering" and it is reflection of how physical pain affects you socially and psychologically. From now on, report them separately.  From this point on, when asked to report your pain level, report only your physical pain. Use the following table for reference.  Pain Clinic Pain Levels (0-5/10)  Pain Level Score  Description  No Pain 0   Mild pain 1 Nagging, annoying, but does not interfere with basic activities of daily living (ADL). Patients are able to eat, bathe, get dressed, toileting (being able to get on and off the toilet and perform personal hygiene functions), transfer (move in and out of bed or a chair without assistance), and maintain continence (able to control bladder and bowel functions). Blood pressure and heart rate are unaffected. A normal heart rate for a healthy adult ranges from 60 to 100 bpm (beats per minute).   Mild to moderate pain 2 Noticeable   and distracting. Impossible to hide from other people. More frequent flare-ups. Still possible to adapt and function close to normal. It can be very annoying and may have occasional stronger flare-ups. With discipline, patients may get used to it and adapt.   Moderate pain 3 Interferes significantly with activities of daily living (ADL). It becomes difficult to feed, bathe, get dressed, get on and off the toilet or to perform personal hygiene functions. Difficult to get in and out of bed or a chair without assistance. Very distracting. With effort, it can be ignored when deeply involved in activities.   Moderately severe pain 4 Impossible to ignore for more than a few minutes. With effort, patients may still be able to  manage work or participate in some social activities. Very difficult to concentrate. Signs of autonomic nervous system discharge are evident: dilated pupils (mydriasis); mild sweating (diaphoresis); sleep interference. Heart rate becomes elevated (>115 bpm). Diastolic blood pressure (lower number) rises above 100 mmHg. Patients find relief in laying down and not moving.   Severe pain 5 Intense and extremely unpleasant. Associated with frowning face and frequent crying. Pain overwhelms the senses.  Ability to do any activity or maintain social relationships becomes significantly limited. Conversation becomes difficult. Pacing back and forth is common, as getting into a comfortable position is nearly impossible. Pain wakes you up from deep sleep. Physical signs will be obvious: pupillary dilation; increased sweating; goosebumps; brisk reflexes; cold, clammy hands and feet; nausea, vomiting or dry heaves; loss of appetite; significant sleep disturbance with inability to fall asleep or to remain asleep. When persistent, significant weight loss is observed due to the complete loss of appetite and sleep deprivation.  Blood pressure and heart rate becomes significantly elevated. Caution: If elevated blood pressure triggers a pounding headache associated with blurred vision, then the patient should immediately seek attention at an urgent or emergency care unit, as these may be signs of an impending stroke.    Emergency Department Pain Levels (6-10/10)  Emergency Room Pain 6 Severely limiting. Requires emergency care and should not be seen or managed at an outpatient pain management facility. Communication becomes difficult and requires great effort. Assistance to reach the emergency department may be required. Facial flushing and profuse sweating along with potentially dangerous increases in heart rate and blood pressure will be evident.   Distressing pain 7 Self-care is very difficult. Assistance is required to  transport, or use restroom. Assistance to reach the emergency department will be required. Tasks requiring coordination, such as bathing and getting dressed become very difficult.   Disabling pain 8 Self-care is no longer possible. At this level, pain is disabling. The individual is unable to do even the most "basic" activities such as walking, eating, bathing, dressing, transferring to a bed, or toileting. Fine motor skills are lost. It is difficult to think clearly.   Incapacitating pain 9 Pain becomes incapacitating. Thought processing is no longer possible. Difficult to remember your own name. Control of movement and coordination are lost.   The worst pain imaginable 10 At this level, most patients pass out from pain. When this level is reached, collapse of the autonomic nervous system occurs, leading to a sudden drop in blood pressure and heart rate. This in turn results in a temporary and dramatic drop in blood flow to the brain, leading to a loss of consciousness. Fainting is one of the body's self defense mechanisms. Passing out puts the brain in a calmed state and causes it to shut down   for a while, in order to begin the healing process.    Summary: 1.   Refer to this scale when providing us with your pain level. 2.   Be accurate and careful when reporting your pain level. This will help with your care. 3.   Over-reporting your pain level will lead to loss of credibility. 4.   Even a level of 1/10 means that there is pain and will be treated at our facility. 5.   High, inaccurate reporting will be documented as "Symptom Exaggeration", leading to loss of credibility and suspicions of possible secondary gains such as obtaining more narcotics, or wanting to appear disabled, for fraudulent reasons. 6.   Only pain levels of 5 or below will be seen at our facility. 7.   Pain levels of 6 and above will be sent to the Emergency Department and the appointment  cancelled.  ______________________________________________________________________________________________     

## 2018-09-04 NOTE — Progress Notes (Signed)
Patient's Name: Marie Clarke  MRN: [id number]  Referring Provider: Milinda Pointer, MD  DOB: April 06, 1970  PCP: Sharyne Peach, MD  DOS: 09/04/2018  Note by: Gaspar Cola, MD  Service setting: Ambulatory outpatient  Specialty: Interventional Pain Management  Patient type: Established  Location: [facility] (AMB) [facility]  Visit type: Interventional Procedure   Primary Reason for Visit: Interventional Pain Management Treatment. CC: Back Pain (lumbar left )  Procedure #1:  Anesthesia, Analgesia, Anxiolysis:  Type: Diagnostic Trans-Foraminal Epidural Steroid Injection   #1  Region: Lumbar Level: L5 Paravertebral Laterality: Left Paravertebral   Type: Moderate (Conscious) Sedation combined with Local Anesthesia Indication(s): Analgesia and Anxiety Route: Intravenous (IV) IV Access: Secured Sedation: Meaningful verbal contact was maintained at all times during the procedure  Local Anesthetic: Lidocaine 1-2%  Position: Prone  Procedure #2:    Type: Diagnostic Inter-Laminar Epidural Steroid Injection   #1  Region: Lumbar Level: L4-5 Level. Laterality: Right Paramedial     Indications: 1. DDD (degenerative disc disease), lumbosacral   2. Lumbar foraminal stenosis (Bilateral) (L5-S1)   3. Lumbar lateral recess stenosis (L5-S1) (Bilateral)   4. Chronic lumbosacral L5-S1 IVD protrusion (Bilateral)   5. Chronic lower extremity pain (Referred) (Secondary Area of Pain) (Left)    Pain Score: Pre-procedure: 9 /10 Post-procedure: 0-No pain/10   Pertinent Labs  COVID-19 screennig: No results found for: SARSCOV2NAA  Pre-op Assessment:  Marie Clarke is a 48 y.o. (year old), female patient, seen today for interventional treatment. She  has a past surgical history that includes prolapse rectum surgery (N/A, July 2016); Abdominal hysterectomy; Tonsillectomy; and Hernia repair (07/15/2017). Marie Clarke has a current medication list which includes the following prescription(s): accu-chek  aviva plus, acetaminophen, aspirin ec, atorvastatin, azelastine, buspirone, dicyclomine, diphenhydramine, diphenoxylate-atropine, dulaglutide, ipratropium, klor-con m20, lamotrigine, lamotrigine, accu-chek safe-t pro, levocetirizine, levothyroxine, magnesium oxide, meloxicam, methocarbamol, montelukast, myrbetriq, nicotrol, nitroglycerin, omega-3 acid ethyl esters, omeprazole, ondansetron, pregabalin, prochlorperazine, propranolol, quetiapine, rizatriptan, torsemide, tramadol, trelegy ellipta, and ventolin hfa, and the following Facility-Administered Medications: fentanyl and midazolam. Her primarily concern today is the Back Pain (lumbar left )  Initial Vital Signs:  Pulse/HCG Rate: 81ECG Heart Rate: 94 Temp: 97.8 F (36.6 C) Resp: 16 BP: (!) 107/58 SpO2: 99 %  BMI: Estimated body mass index is 40.68 kg/m as calculated from the following:   Height as of this encounter: 5\' 4"  (1.626 m).   Weight as of this encounter: 237 lb (107.5 kg).  Risk Assessment: Allergies: Reviewed. She is allergic to diazepam; levofloxacin; metronidazole; pregabalin; sulfa antibiotics; cephalexin; ciprofloxacin; ziprasidone hcl; and penicillins.  Allergy Precautions: None required Coagulopathies: Reviewed. None identified.  Blood-thinner therapy: None at this time Active Infection(s): Reviewed. None identified. Marie Clarke is afebrile  Site Confirmation: Marie Clarke was asked to confirm the procedure and laterality before marking the site Procedure checklist: Completed Consent: Before the procedure and under the influence of no sedative(s), amnesic(s), or anxiolytics, the patient was informed of the treatment options, risks and possible complications. To fulfill our ethical and legal obligations, as recommended by the American Medical Association's Code of Ethics, I have informed the patient of my clinical impression; the nature and purpose of the treatment or procedure; the risks, benefits, and possible complications of  the intervention; the alternatives, including doing nothing; the risk(s) and benefit(s) of the alternative treatment(s) or procedure(s); and the risk(s) and benefit(s) of doing nothing. The patient was provided information about the general risks and possible complications associated with the procedure. These may include, but are  not limited to: failure to achieve desired goals, infection, bleeding, organ or nerve damage, allergic reactions, paralysis, and death. In addition, the patient was informed of those risks and complications associated to Spine-related procedures, such as failure to decrease pain; infection (i.e.: Meningitis, epidural or intraspinal abscess); bleeding (i.e.: epidural hematoma, subarachnoid hemorrhage, or any other type of intraspinal or peri-dural bleeding); organ or nerve damage (i.e.: Any type of peripheral nerve, nerve root, or spinal cord injury) with subsequent damage to sensory, motor, and/or autonomic systems, resulting in permanent pain, numbness, and/or weakness of one or several areas of the body; allergic reactions; (i.e.: anaphylactic reaction); and/or death. Furthermore, the patient was informed of those risks and complications associated with the medications. These include, but are not limited to: allergic reactions (i.e.: anaphylactic or anaphylactoid reaction(s)); adrenal axis suppression; blood sugar elevation that in diabetics may result in ketoacidosis or comma; water retention that in patients with history of congestive heart failure may result in shortness of breath, pulmonary edema, and decompensation with resultant heart failure; weight gain; swelling or edema; medication-induced neural toxicity; particulate matter embolism and blood vessel occlusion with resultant organ, and/or nervous system infarction; and/or aseptic necrosis of one or more joints. Finally, the patient was informed that Medicine is not an exact science; therefore, there is also the possibility  of unforeseen or unpredictable risks and/or possible complications that may result in a catastrophic outcome. The patient indicated having understood very clearly. We have given the patient no guarantees and we have made no promises. Enough time was given to the patient to ask questions, all of which were answered to the patient's satisfaction. Ms. Bilello has indicated that she wanted to continue with the procedure. Attestation: I, the ordering provider, attest that I have discussed with the patient the benefits, risks, side-effects, alternatives, likelihood of achieving goals, and potential problems during recovery for the procedure that I have provided informed consent. Date  Time: 09/04/2018  8:00 AM  Pre-Procedure Preparation:  Monitoring: As per clinic protocol. Respiration, ETCO2, SpO2, BP, heart rate and rhythm monitor placed and checked for adequate function Safety Precautions: Patient was assessed for positional comfort and pressure points before starting the procedure. Time-out: I initiated and conducted the "Time-out" before starting the procedure, as per protocol. The patient was asked to participate by confirming the accuracy of the "Time Out" information. Verification of the correct person, site, and procedure were performed and confirmed by me, the nursing staff, and the patient. "Time-out" conducted as per Joint Commission's Universal Protocol (UP.01.01.01). Time: 2993  Description of Procedure #1:  Target Area: The inferior and lateral portion of the pedicle, just lateral to a line created by the 6:00 position of the pedicle and the superior articular process of the vertebral body below. On the lateral view, this target lies just posterior to the anterior aspect of the lamina and posterior to the midpoint created between the anterior and the posterior aspect of the neural foramina. Approach: Posterior paravertebral approach. Area Prepped: Entire Posterior Lumbosacral Region Prepping  solution: DuraPrep (Iodine Povacrylex [0.7% available iodine] and Isopropyl Alcohol, 74% w/w) Safety Precautions: Aspiration looking for blood return was conducted prior to all injections. At no point did we inject any substances, as a needle was being advanced. No attempts were made at seeking any paresthesias. Safe injection practices and needle disposal techniques used. Medications properly checked for expiration dates. SDV (single dose vial) medications used.  Description of the Procedure: Protocol guidelines were followed. The patient was placed in position  over the fluoroscopy table. The target area was identified and the area prepped in the usual manner. Skin & deeper tissues infiltrated with local anesthetic. Appropriate amount of time allowed to pass for local anesthetics to take effect. The procedure needles were then advanced to the target area. Proper needle placement secured. Negative aspiration confirmed. Solution injected in intermittent fashion, asking for systemic symptoms every 0.2cc of injectate. The needles were then removed and the area cleansed, making sure to leave some of the prepping solution back to take advantage of its long term bactericidal properties.  Start Time: 0842 hrs.  Materials:  Needle(s) Type: Spinal Needle Gauge: 22G Length: 3.5-in Medication(s): Please see orders for medications and dosing details.  Description of Procedure #2:  Target Area: The  interlaminar space, initially targeting the lower border of the superior vertebral body lamina. Approach: Posterior paramedial approach. Area Prepped: Same as above Prepping solution: Same as above Safety Precautions: Same as above  Description of the Procedure: Protocol guidelines were followed. The patient was placed in position over the fluoroscopy table. The target area was identified and the area prepped in the usual manner. Skin & deeper tissues infiltrated with local anesthetic. Appropriate amount of time  allowed to pass for local anesthetics to take effect. The procedure needle was introduced through the skin, ipsilateral to the reported pain, and advanced to the target area. Bone was contacted and the needle walked caudad, until the lamina was cleared. The ligamentum flavum was engaged and loss-of-resistance technique used as the epidural needle was advanced. The epidural space was identified using "loss-of-resistance technique" with 2-3 ml of PF-NaCl (0.9% NSS), in a 5cc LOR glass syringe. Proper needle placement secured. Negative aspiration confirmed. Solution injected in intermittent fashion, asking for systemic symptoms every 0.5cc of injectate. The needles were then removed and the area cleansed, making sure to leave some of the prepping solution back to take advantage of its long term bactericidal properties.  Vitals:   09/04/18 0853 09/04/18 0901 09/04/18 0911 09/04/18 0921  BP: 90/62 109/76 102/65 108/72  Pulse:      Resp: 15 16 (!) 24 16  Temp:  98.2 F (36.8 C)  97.9 F (36.6 C)  TempSrc:  Temporal    SpO2: 98% 99% 100% 100%  Weight:      Height:        End Time: 0852 hrs.  Materials:  Needle(s) Type: Epidural needle Gauge: 17G Length: 3.5-in Medication(s): Please see orders for medications and dosing details.  Imaging Guidance (Spinal):          Type of Imaging Technique: Fluoroscopy Guidance (Spinal) Indication(s): Assistance in needle guidance and placement for procedures requiring needle placement in or near specific anatomical locations not easily accessible without such assistance. Exposure Time: Please see nurses notes. Contrast: Before injecting any contrast, we confirmed that the patient did not have an allergy to iodine, shellfish, or radiological contrast. Once satisfactory needle placement was completed at the desired level, radiological contrast was injected. Contrast injected under live fluoroscopy. No contrast complications. See chart for type and volume of  contrast used. Fluoroscopic Guidance: I was personally present during the use of fluoroscopy. "Tunnel Vision Technique" used to obtain the best possible view of the target area. Parallax error corrected before commencing the procedure. "Direction-depth-direction" technique used to introduce the needle under continuous pulsed fluoroscopy. Once target was reached, antero-posterior, oblique, and lateral fluoroscopic projection used confirm needle placement in all planes. Images permanently stored in EMR. Interpretation: I personally interpreted the  imaging intraoperatively. Adequate needle placement confirmed in multiple planes. Appropriate spread of contrast into desired area was observed. No evidence of afferent or efferent intravascular uptake. No intrathecal or subarachnoid spread observed. Permanent images saved into the patient's record.  Antibiotic Prophylaxis:   Anti-infectives (From admission, onward)   None     Indication(s): None identified  Post-operative Assessment:  Post-procedure Vital Signs:  Pulse/HCG Rate: 8186 Temp: 97.9 F (36.6 C) Resp: 16 BP: 108/72 SpO2: 100 %  EBL: None  Complications: No immediate post-treatment complications observed by team, or reported by patient.  Note: The patient tolerated the entire procedure well. A repeat set of vitals were taken after the procedure and the patient was kept under observation following institutional policy, for this type of procedure. Post-procedural neurological assessment was performed, showing return to baseline, prior to discharge. The patient was provided with post-procedure discharge instructions, including a section on how to identify potential problems. Should any problems arise concerning this procedure, the patient was given instructions to immediately contact us, at any time, without hesitation. In any case, we plan to contact the patient by telephone for a follow-up status report regarding this interventional  procedure.  Comments:  No additional relevant information.  Plan of Care  Orders:  Orders Placed This Encounter  Procedures  . Lumbar Epidural Injection    Scheduling Instructions:     Procedure: Interlaminar LESI L4-5     Laterality: Right-sided     Sedation: Patient's choice     Timeframe:  Today    Order Specific Question:   Where will this procedure be performed?    Answer:   [facility]  . Lumbar Transforaminal Epidural    Scheduling Instructions:     Side: Left-sided     Level: L5     Sedation: With Sedation.     Timeframe: Today    Order Specific Question:   Where will this procedure be performed?    Answer:   [facility]  . [facility] C-ARM 1-60 MIN NO REPORT    Intraoperative interpretation by procedural physician at [place].    Standing Status:   Standing    Number of Occurrences:   1    Order Specific Question:   Reason for exam:    Answer:   Assistance in needle guidance and placement for procedures requiring needle placement in or near specific anatomical locations not easily accessible without such assistance.  . Provider attestation of informed consent for procedure/surgical case    I, the ordering provider, attest that I have discussed with the patient the benefits, risks, side effects, alternatives, likelihood of achieving goals and potential problems during recovery for the procedure that I have provided informed consent.    Standing Status:   Standing    Number of Occurrences:   1  . Informed Consent Details: Transcribe to consent form and obtain patient signature    Standing Status:   Standing    Number of Occurrences:   1    Order Specific Question:   Procedure    Answer:   Lumbar epidural steroid injection under fluoroscopic guidance. (See notes for level and laterality.)    Order Specific Question:   Surgeon    Answer:   Rebeckah Masih A. Dossie Arbour, MD    Order Specific Question:   Indication/Reason    Answer:   Low back  pain and/or leg pain secondary to lumbar radiculitis/radiculopathy  . Informed Consent Details: Transcribe to consent form and obtain  patient signature    Standing Status:   Standing    Number of Occurrences:   1    Order Specific Question:   Procedure    Answer:   Lumbar transforaminal epidural steroid injection under fluoroscopic guidance. (See notes for level and laterality.)    Order Specific Question:   Surgeon    Answer:   Adren Dollins A. Dossie Arbour, MD    Order Specific Question:   Indication/Reason    Answer:   Lumbar radiculopathy/radiculitis   Chronic Opioid Analgesic:  Tramadol 50 mg, 1 tab q 6 hrs (200 mg/day of tramadol) MME/day:20mg /day.   Medications ordered for procedure: Meds ordered this encounter  Medications  . iohexol (OMNIPAQUE) 180 MG/ML injection 10 mL    Must be Myelogram-compatible. If not available, you may substitute with a water-soluble, non-ionic, hypoallergenic, myelogram-compatible radiological contrast medium.  Marland Kitchen lidocaine (XYLOCAINE) 2 % (with pres) injection 400 mg  . lactated ringers infusion 1,000 mL  . midazolam (VERSED) 5 MG/5ML injection 1-2 mg    Make sure Flumazenil is available in the pyxis when using this medication. If oversedation occurs, administer 0.2 mg IV over 15 sec. If after 45 sec no response, administer 0.2 mg again over 1 min; may repeat at 1 min intervals; not to exceed 4 doses (1 mg)  . fentaNYL (SUBLIMAZE) injection 25-50 mcg    Make sure Narcan is available in the pyxis when using this medication. In the event of respiratory depression (RR< 8/min): Titrate NARCAN (naloxone) in increments of 0.1 to 0.2 mg IV at 2-3 minute intervals, until desired degree of reversal.  . sodium chloride flush (NS) 0.9 % injection 2 mL  . ropivacaine (PF) 2 mg/mL (0.2%) (NAROPIN) injection 2 mL  . triamcinolone acetonide (KENALOG-40) injection 40 mg  . sodium chloride flush (NS) 0.9 % injection 1 mL  . ropivacaine (PF) 2 mg/mL (0.2%) (NAROPIN) injection  1 mL  . dexamethasone (DECADRON) injection 10 mg   Medications administered: We administered iohexol, lidocaine, lactated ringers, midazolam, fentaNYL, sodium chloride flush, ropivacaine (PF) 2 mg/mL (0.2%), triamcinolone acetonide, sodium chloride flush, ropivacaine (PF) 2 mg/mL (0.2%), and dexamethasone.  See the medical record for exact dosing, route, and time of administration.  Follow-up plan:   Return for (VV), 2 wk PP-F/U Eval.       Interventional management options: Planned, scheduled, and/or pending:   Therapeutic bilateral lumbar facet RFA #1 under fluoroscopic guidance and IV sedation, starting with the left side.   Considering:   Diagnosticbilateral lumbar facet nerve block Possible bilateral lumbar facetRFA Diagnostic left-sided L5-S1 transforaminal ESI Diagnostic left-sided L5 selective nerve root block Possible left-sided L5 DRG RFA Diagnostic left-sidedlumbarsympathetic nerve block Possible left-sided lumbar sympathetic RFA Diagnostic bilateral cervical facet nerve block Possible bilateral cervical facetRFA Diagnosticleft intra-articular shoulder injection Diagnosticleft suprascapular nerve block Diagnostic left suprascapularRFA Possible lumbar spinal cord stimulator trial   Palliative PRN treatment(s):   Diagnostic left L5 TFESI #2 + right L4-5 LESI #2  Palliative bilateral lumbar facet blocks Palliative right-sided lumbar facet RFA #2 (last done 04/22/2018) Palliative left-sided lumbar facet RFA #2 (last done 03/13/2018)     Recent Visits Date Type Provider Dept  08/25/18 Office Visit Milinda Pointer, MD [facility]  08/04/18 Office Visit Milinda Pointer, MD [facility]  07/10/18 Office Visit Milinda Pointer, MD [facility]  Showing recent visits within past 90 days and meeting all other requirements   Today's Visits Date Type Provider Dept  09/04/18 Procedure visit Milinda Pointer, MD  [facility]  Showing today's visits and meeting all other requirements   Future Appointments Date Type Provider Dept  09/18/18 Appointment Milinda Pointer, MD [facility]  10/15/18 Appointment Milinda Pointer, MD [facility]  Showing future appointments within next 90 days and meeting all other requirements   Disposition: Discharge home  Discharge Date & Time: 09/04/2018; 0923 hrs.   Primary Care Physician: Sharyne Peach, MD Location: [facility] Note by: Gaspar Cola, MD Date: 09/04/2018; Time: 9:39 AM  Disclaimer:  Medicine is not an Chief Strategy Officer. The only guarantee in medicine is that nothing is guaranteed. It is important to note that the decision to proceed with this intervention was based on the information collected from the patient. The Data and conclusions were drawn from the patient's questionnaire, the interview, and the physical examination. Because the information was provided in large part by the patient, it cannot be guaranteed that it has not been purposely or unconsciously manipulated. Every effort has been made to obtain as much relevant data as possible for this evaluation. It is important to note that the conclusions that lead to this procedure are derived in large part from the available data. Always take into account that the treatment will also be dependent on availability of resources and existing treatment guidelines, considered by other Pain Management Practitioners as being common knowledge and practice, at the time of the intervention. For Medico-Legal purposes, it is also important to point out that variation in procedural techniques and pharmacological choices are the acceptable norm. The indications, contraindications, technique, and results of the above procedure should only be interpreted and judged by a Board-Certified Interventional Pain Specialist with extensive familiarity and expertise in  the same exact procedure and technique.

## 2018-09-05 ENCOUNTER — Telehealth: Payer: Self-pay | Admitting: *Deleted

## 2018-09-05 NOTE — Telephone Encounter (Signed)
Spoke with patient verbalizes no questions or concerns re; procedure on yesterday.  

## 2018-09-09 ENCOUNTER — Other Ambulatory Visit: Payer: Self-pay | Admitting: Psychiatry

## 2018-09-10 ENCOUNTER — Encounter: Payer: Self-pay | Admitting: Psychiatry

## 2018-09-10 ENCOUNTER — Ambulatory Visit (INDEPENDENT_AMBULATORY_CARE_PROVIDER_SITE_OTHER): Payer: Medicare HMO | Admitting: Psychiatry

## 2018-09-10 ENCOUNTER — Other Ambulatory Visit: Payer: Self-pay

## 2018-09-10 DIAGNOSIS — F3160 Bipolar disorder, current episode mixed, unspecified: Secondary | ICD-10-CM | POA: Diagnosis not present

## 2018-09-10 DIAGNOSIS — F4310 Post-traumatic stress disorder, unspecified: Secondary | ICD-10-CM

## 2018-09-10 DIAGNOSIS — F410 Panic disorder [episodic paroxysmal anxiety] without agoraphobia: Secondary | ICD-10-CM

## 2018-09-10 DIAGNOSIS — F17200 Nicotine dependence, unspecified, uncomplicated: Secondary | ICD-10-CM | POA: Diagnosis not present

## 2018-09-10 MED ORDER — QUETIAPINE FUMARATE 100 MG PO TABS: 100.0000 mg | ORAL_TABLET | Freq: Every evening | ORAL | 0 refills | Status: DC | PRN

## 2018-09-10 MED ORDER — LAMOTRIGINE 25 MG PO TABS: ORAL_TABLET | ORAL | 0 refills | Status: DC

## 2018-09-10 MED ORDER — LAMOTRIGINE 100 MG PO TABS: 100.0000 mg | ORAL_TABLET | Freq: Every day | ORAL | 0 refills | Status: DC

## 2018-09-10 NOTE — Progress Notes (Signed)
Virtual Visit via Video Note  I connected with Marie Clarke on 09/10/18 at 10:15 AM EDT by a video enabled telemedicine application and verified that I am speaking with the correct person using two identifiers.   I discussed the limitations of evaluation and management by telemedicine and the availability of in person appointments. The patient expressed understanding and agreed to proceed.   I discussed the assessment and treatment plan with the patient. The patient was provided an opportunity to ask questions and all were answered. The patient agreed with the plan and demonstrated an understanding of the instructions.   The patient was advised to call back or seek an in-person evaluation if the symptoms worsen or if the condition fails to improve as anticipated.  [facility] MD OP Progress Note  09/10/2018 12:52 PM Marie Clarke  MRN:  [id number]  Chief Complaint:  Chief Complaint    Follow-up     HPI: Marie Clarke is a 48 year old Caucasian female, single, on SSD, lives in [place], has a history of bipolar disorder, PTSD, panic attacks, COPD, OSA on CPAP, coronary artery disease, hyperlipidemia, hypertension, hypothyroidism, back pain was evaluated by telemedicine today.  Patient today reports she is currently doing well with regards to her mood symptoms.  She reports the itching of her skin which she had which she thought was due to her anxiety has resolved.  She does have it on and off and Benadryl does help when she takes it.  She reports she takes propranolol on a regular basis which keeps her anxiety symptoms under control.  She is also in psychotherapy sessions with Ms. Cecilie Lowers which is going well.  Patient reports sleep is good.  Patient denies any suicidality, homicidality or perceptual disturbances at this time.  Patient reports appetite is fair.  Patient reports she has been staying busy by working on activities like puzzles.  Patient denies any other concerns today. Visit Diagnosis:     ICD-10-CM   1. Bipolar I disorder, most recent episode mixed (HCC)  F31.60 lamoTRIgine (LAMICTAL) 100 MG tablet    lamoTRIgine (LAMICTAL) 25 MG tablet    QUEtiapine (SEROQUEL) 100 MG tablet  2. PTSD (post-traumatic stress disorder)  F43.10   3. Panic attacks  F41.0   4. Tobacco use disorder  F17.200    in early remission    Past Psychiatric History: I have reviewed past psychiatric history from my progress note on 03/06/2017.  Past Medical History:  Past Medical History:  Diagnosis Date  . Anemia   . Anxiety   . Asthma   . Bipolar disorder ([place])   . CAD (coronary artery disease) unk  . CHF (congestive heart failure) ([place])   . COPD (chronic obstructive pulmonary disease) ([place])   . Depression unk  . Diabetes mellitus without complication ([place])   . Diabetes mellitus, type II ([place])   . Drug overdose   . GERD (gastroesophageal reflux disease)   . Headache   . Hyperlipidemia   . Hypertension   . Left leg pain 04/29/2014  . MI (myocardial infarction) ([place])   . Muscle ache 09/16/2014  . Osteoporosis   . Overactive bladder   . Pancreatitis unk  . Reflex sympathetic dystrophy   . Renal insufficiency   . Sleep apnea    pt reported on 2/6/7 she currently is not using CPAP  . Sleep apnea   . Stroke ([place])   . Thyroid disease   . TIA (transient ischemic attack) unk  . TIA (transient ischemic attack)  Past Surgical History:  Procedure Laterality Date  . ABDOMINAL HYSTERECTOMY    . HERNIA REPAIR  07/15/2017  . prolapse rectum surgery N/A July 2016  . TONSILLECTOMY      Family Psychiatric History: I have reviewed family psychiatric history from my progress note on 03/06/2017.  Family History:  Family History  Problem Relation Age of Onset  . Diabetes Mellitus II Mother   . CAD Mother   . Sleep apnea Mother   . Osteoarthritis Mother   . Osteoporosis Mother   . Anxiety disorder Mother   . Depression Mother   . Bipolar disorder Mother   . Bipolar disorder Father   .  Hypertension Father   . Depression Father   . Anxiety disorder Father   . Post-traumatic stress disorder Sister     Social History: I have reviewed social history from my progress note on 03/06/2017. Social History   Socioeconomic History  . Marital status: Single    Spouse name: Not on file  . Number of children: 0  . Years of education: Not on file  . Highest education level: High school graduate  Occupational History    Comment: not employed  Social Needs  . Financial resource strain: Not hard at all  . Food insecurity    Worry: Never true    Inability: Never true  . Transportation needs    Medical: Yes    Non-medical: Yes  Tobacco Use  . Smoking status: Former Smoker    Packs/day: 0.50    Types: Cigarettes  . Smokeless tobacco: Never Used  Substance and Sexual Activity  . Alcohol use: No    Alcohol/week: 0.0 standard drinks  . Drug use: No  . Sexual activity: Not Currently  Lifestyle  . Physical activity    Days per week: 0 days    Minutes per session: 0 min  . Stress: Not at all  Relationships  . Social connections    Talks on phone: More than three times a week    Gets together: More than three times a week    Attends religious service: More than 4 times per year    Active member of club or organization: No    Attends meetings of clubs or organizations: Never    Relationship status: Never married  Other Topics Concern  . Not on file  Social History Narrative  . Not on file    Allergies:  Allergies  Allergen Reactions  . Diazepam Hives and Nausea And Vomiting  . Levofloxacin Hives and Rash    1/31: would like to retry since not taking valium  . Metronidazole Hives    1/31: would like to retry since she is no longer taking valium  . Pregabalin Hives and Rash    1/31 currently taking lyrica without reaction since no longer taking valium  . Sulfa Antibiotics Hives  . Cephalexin Hives  . Ciprofloxacin Hives  . Ziprasidone Hcl Other (See Comments)     CONFUSED CONFUSED Other reaction(s): Other (See Comments) CONFUSED CONFUSED   . Penicillins Nausea And Vomiting    Has patient had a PCN reaction causing immediate rash, facial/tongue/throat swelling, SOB or lightheadedness with hypotension: No Has patient had a PCN reaction causing severe rash involving mucus membranes or skin necrosis: No Has patient had a PCN reaction that required hospitalization: No Has patient had a PCN reaction occurring within the last 10 years: No If all of the above answers are "NO", then may proceed with Cephalosporin use.  Metabolic Disorder Labs: No results found for: HGBA1C, MPG No results found for: PROLACTIN No results found for: CHOL, TRIG, HDL, CHOLHDL, VLDL, LDLCALC No results found for: TSH  Therapeutic Level Labs: No results found for: LITHIUM No results found for: VALPROATE No components found for:  CBMZ  Current Medications: Current Outpatient Medications  Medication Sig Dispense Refill  . ACCU-CHEK AVIVA PLUS test strip     . acetaminophen (TYLENOL) 325 MG tablet Take 1 tablet (325 mg total) by mouth every 6 (six) hours as needed for mild pain (or Fever >/= 101).    Marland Kitchen aspirin EC 81 MG tablet Take 81 mg by mouth daily at 12 noon.    Marland Kitchen atorvastatin (LIPITOR) 40 MG tablet Take 40 mg by mouth at bedtime.     Marland Kitchen azelastine (ASTELIN) 0.1 % nasal spray Place 1 spray into both nostrils daily.    . busPIRone (BUSPAR) 10 MG tablet Take 2 tablets (20 mg total) by mouth 3 (three) times daily. 540 tablet 1  . CHANTIX 0.5 MG tablet     . dicyclomine (BENTYL) 10 MG capsule     . diphenhydrAMINE (BENADRYL) 25 mg capsule TAKE 1 CAPSULE BY MOUTH TWICE DAILY AS NEEDED FOR ITCHING 60 capsule 1  . diphenoxylate-atropine (LOMOTIL) 2.5-0.025 MG tablet Take by mouth.    . Dulaglutide 0.75 MG/0.5ML SOPN Inject into the skin.    Marland Kitchen ipratropium (ATROVENT) 0.03 % nasal spray Place into the nose.    Marland Kitchen KLOR-CON M20 20 MEQ tablet Take 20 mEq by mouth daily.     Marland Kitchen  lamoTRIgine (LAMICTAL) 100 MG tablet Take 1 tablet (100 mg total) by mouth daily. 90 tablet 0  . lamoTRIgine (LAMICTAL) 25 MG tablet TAKE 1 TABLET BY MOUTH ONCE DAILY *TO BE COMBINED WITH 100MG * 90 tablet 0  . Lancets (ACCU-CHEK SAFE-T PRO) lancets     . levocetirizine (XYZAL) 5 MG tablet     . levothyroxine (SYNTHROID) 25 MCG tablet     . levothyroxine (SYNTHROID, LEVOTHROID) 200 MCG tablet Take 200 mcg by mouth daily.     . Magnesium Oxide 500 MG CAPS Take 1 capsule (500 mg total) by mouth 2 (two) times daily at 8 am and 10 pm. 180 capsule 0  . [START ON 10/08/2018] meloxicam (MOBIC) 15 MG tablet Take 1 tablet (15 mg total) by mouth daily with breakfast for 20 days. Max: 1/day 20 tablet 0  . metFORMIN (GLUCOPHAGE) 500 MG tablet     . methocarbamol (ROBAXIN) 750 MG tablet Take 1 tablet (750 mg total) by mouth every 8 (eight) hours as needed for muscle spasms. 90 tablet 2  . montelukast (SINGULAIR) 10 MG tablet Take 10 mg by mouth daily.     Marland Kitchen MYRBETRIQ 50 MG TB24 tablet     . naproxen (NAPROSYN) 500 MG tablet     . NICOTROL 10 MG inhaler     . nitroGLYCERIN (NITROSTAT) 0.4 MG SL tablet Place under the tongue.    Marland Kitchen omega-3 acid ethyl esters (LOVAZA) 1 g capsule     . [START ON 10/08/2018] omeprazole (PRILOSEC) 20 MG capsule Take 1 capsule (20 mg total) by mouth at bedtime for 20 days. 20 capsule 0  . ondansetron (ZOFRAN-ODT) 8 MG disintegrating tablet Take 8 mg by mouth as needed.    . pregabalin (LYRICA) 75 MG capsule Take 1 capsule (75 mg total) by mouth 2 (two) times daily for 15 days, THEN 1 capsule (75 mg total) 3 (three) times daily for 15  days. 75 capsule 0  . prochlorperazine (COMPAZINE) 10 MG tablet     . propranolol (INDERAL) 20 MG tablet Take 1 tablet (20 mg total) by mouth 3 (three) times daily as needed. anxiety 90 tablet 1  . QUEtiapine (SEROQUEL) 100 MG tablet Take 1-1.5 tablets (100-150 mg total) by mouth at bedtime as needed. 135 tablet 0  . rizatriptan (MAXALT) 10 MG tablet Take  1 tablet (10 mg total) by mouth as needed. 10 tablet 2  . torsemide (DEMADEX) 20 MG tablet Take by mouth.    . traMADol (ULTRAM) 50 MG tablet Take 1 tablet (50 mg total) by mouth every 6 (six) hours as needed for severe pain. Max: 4/day 120 tablet 2  . TRELEGY ELLIPTA 100-62.5-25 MCG/INH AEPB     . VENTOLIN HFA 108 (90 Base) MCG/ACT inhaler      No current facility-administered medications for this visit.      Musculoskeletal: Strength & Muscle Tone: UTA Gait & Station: normal Patient leans: N/A  Psychiatric Specialty Exam: Review of Systems  Psychiatric/Behavioral: The patient is nervous/anxious.   All other systems reviewed and are negative.   There were no vitals taken for this visit.There is no height or weight on file to calculate BMI.  General Appearance: Casual  Eye Contact:  Fair  Speech:  Clear and Coherent  Volume:  Normal  Mood:  Anxious  Affect:  Congruent  Thought Process:  Goal Directed and Descriptions of Associations: Intact  Orientation:  Full (Time, Place, and Person)  Thought Content: Logical   Suicidal Thoughts:  No  Homicidal Thoughts:  No  Memory:  Immediate;   Fair Recent;   Fair Remote;   Fair  Judgement:  Fair  Insight:  Fair  Psychomotor Activity:  Normal  Concentration:  Concentration: Fair and Attention Span: Fair  Recall:  [employer] of Knowledge: Fair  Language: Fair  Akathisia:  No  Handed:  Right  AIMS (if indicated): Denies tremors, rigidity  Assets:  Communication Skills Desire for Improvement Social Support  ADL's:  Intact  Cognition: WNL  Sleep:  Fair   Screenings: PHQ2-9     Procedure visit from 04/22/2018 in [facility] Procedure visit from 03/13/2018 in [facility] Office Visit from 12/23/2017 in [facility] Procedure visit from 12/05/2017 in [facility]  Office Visit from 11/13/2017 in [facility]  PHQ-2 Total Score  0  0  0  0  0       Assessment and Plan: Marie Clarke is a 48 year old Caucasian female who has a history of bipolar disorder, panic attacks, tobacco use disorder, multiple medical problems was evaluated by telemedicine today.  Patient is currently making progress on the current medication regimen.  Plan as noted below.  Plan Bipolar disorder-improving Seroquel, will change to 100 to 150 mg p.o. nightly as needed.  Patient reports she does not want to take a 150 mg every night. Lamotrigine 125 mg p.o. daily BuSpar 20 mg p.o. 3 times daily  For PTSD-improving She will continue CBT with Ms. Cecilie Lowers.  For panic attacks-improving Propranolol 20 mg p.o. 3 times daily Benadryl 25 mg p.o. twice daily PRN for severe anxiety/itching  For tobacco use disorder- in early remission Patient reports that she quit smoking.  Follow-up in clinic in 2 to 3 months or sooner if needed.  Patient reports she wants to  follow-up in 3 months and will call the clinic sooner if needed.  October 29 at 10 AM  I have spent atleast 15 minutes non  face to face with patient today. More than 50 % of the time was spent for psychoeducation and supportive psychotherapy and care coordination.  This note was generated in part or whole with voice recognition software. Voice recognition is usually quite accurate but there are transcription errors that can and very often do occur. I apologize for any typographical errors that were not detected and corrected.        Ursula Alert, MD 09/10/2018, 12:52 PM

## 2018-09-11 ENCOUNTER — Other Ambulatory Visit: Payer: Self-pay | Admitting: Pain Medicine

## 2018-09-11 DIAGNOSIS — K219 Gastro-esophageal reflux disease without esophagitis: Secondary | ICD-10-CM

## 2018-09-11 DIAGNOSIS — M7918 Myalgia, other site: Secondary | ICD-10-CM

## 2018-09-11 DIAGNOSIS — G8929 Other chronic pain: Secondary | ICD-10-CM

## 2018-09-15 ENCOUNTER — Ambulatory Visit: Payer: Medicare HMO | Admitting: Psychiatry

## 2018-09-15 ENCOUNTER — Telehealth: Payer: Self-pay

## 2018-09-15 DIAGNOSIS — F3160 Bipolar disorder, current episode mixed, unspecified: Secondary | ICD-10-CM

## 2018-09-15 DIAGNOSIS — F410 Panic disorder [episodic paroxysmal anxiety] without agoraphobia: Secondary | ICD-10-CM

## 2018-09-15 DIAGNOSIS — F4310 Post-traumatic stress disorder, unspecified: Secondary | ICD-10-CM

## 2018-09-15 MED ORDER — DIVALPROEX SODIUM 250 MG PO DR TAB: 250.0000 mg | DELAYED_RELEASE_TABLET | Freq: Three times a day (TID) | ORAL | 1 refills | Status: DC

## 2018-09-15 NOTE — Telephone Encounter (Signed)
pt called questing the status of her message.  pt states she is itching all over and is having mood swings and that her uncle told her that she going to have to leave the house.

## 2018-09-15 NOTE — Telephone Encounter (Signed)
pt called states that she needs something to calm her nerves down.

## 2018-09-15 NOTE — Telephone Encounter (Signed)
Patient reports she wants something for her nerves.  She reports she is very nervous, anxious and has itching all over her skin.  She reports the Benadryl was helpful initially however it does not help like it used to.  Discussed with patient that her lamotrigine can be changed to Depakote.  She reports she has never tried it before.  She will start Depakote 250 mg 3 times a day.  Discussed with her to call to schedule a sooner appointment with her therapist.  Also discussed possible IOP or inpatient admission if she continues to not do well.  Advised patient to call the front desk to schedule a sooner appointment with Probation officer.  We will send lab slip to get Depakote levels and CMP to her mailing address today.

## 2018-09-16 ENCOUNTER — Encounter: Payer: Self-pay | Admitting: Licensed Clinical Social Worker

## 2018-09-16 ENCOUNTER — Other Ambulatory Visit: Payer: Self-pay

## 2018-09-16 ENCOUNTER — Ambulatory Visit (INDEPENDENT_AMBULATORY_CARE_PROVIDER_SITE_OTHER): Payer: Medicare HMO | Admitting: Licensed Clinical Social Worker

## 2018-09-16 DIAGNOSIS — F4310 Post-traumatic stress disorder, unspecified: Secondary | ICD-10-CM | POA: Diagnosis not present

## 2018-09-16 DIAGNOSIS — F3160 Bipolar disorder, current episode mixed, unspecified: Secondary | ICD-10-CM | POA: Diagnosis not present

## 2018-09-16 NOTE — Progress Notes (Signed)
Virtual Visit via Telephone Note  I connected with Marie Clarke on 09/16/18 at  9:00 AM EDT by telephone and verified that I am speaking with the correct person using two identifiers.   I discussed the limitations, risks, security and privacy concerns of performing an evaluation and management service by telephone and the availability of in person appointments. I also discussed with the patient that there may be a patient responsible charge related to this service. The patient expressed understanding and agreed to proceed.  I discussed the assessment and treatment plan with the patient. The patient was provided an opportunity to ask questions and all were answered. The patient agreed with the plan and demonstrated an understanding of the instructions.   The patient was advised to call back or seek an in-person evaluation if the symptoms worsen or if the condition fails to improve as anticipated.  I provided 20 minutes of non-face-to-face time during this encounter.   Alden Hipp, LCSW    THERAPIST PROGRESS NOTE  Session Time: 0900  Participation Level: Minimal  Behavioral Response: CasualAlertIrritable  Type of Therapy: Individual Therapy  Treatment Goals addressed: Coping  Interventions: Supportive  Summary: Marie Clarke is a 48 y.o. female who presents with continued symptoms related to her diagnosis. Marie Clarke reports she forgot about our appointment again today, and did not have much to discuss. LCSW again asked Marie Clarke to write down our appointment time/dates so she is able to engage in sessions and fully utilize her time. Marie Clarke became irritated and stated, "do not talk to me like that." LCSW explained she was not trying to be rude, and that she just wanted Marie Clarke to benefit from therapy as much as possible. Marie Clarke expressed understanding and apologized for "snapping." Marie Clarke reports she had a difficult day yesterday, "the doctor had to change my medications because I was having a Bipolar moment."  She reported those in her life had pointed out she was acting differently, and she was able to recognize that as well and contacted the MD in the clinic. LCSW validated feelings around that situation, and highlighted the insight it takes to recognize changes within our own mental health. Marie Clarke was able to recognize this as well. Marie Clarke reported she needed to go, "I need to dry my hair." Marie Clarke then ended the call by saying she would call back to schedule her next appointment.   Suicidal/Homicidal: No  Therapist Response: Marie Clarke continues to work towards her tx goals but has not yet reached them. We will continue to work on emotional regulation and communication skills moving forward.   Plan: Return again in 4 weeks.  Diagnosis: Axis I: Post Traumatic Stress Disorder    Axis II: No diagnosis    Alden Hipp, LCSW 09/16/2018

## 2018-09-17 ENCOUNTER — Emergency Department
Admission: EM | Admit: 2018-09-17 | Discharge: 2018-09-17 | Disposition: A | Discharge status: Home / Self Care | Payer: Medicare HMO | Attending: Student | Admitting: Student

## 2018-09-17 ENCOUNTER — Other Ambulatory Visit: Payer: Self-pay

## 2018-09-17 ENCOUNTER — Encounter: Payer: Self-pay | Admitting: Emergency Medicine

## 2018-09-17 ENCOUNTER — Emergency Department: Payer: Medicare HMO

## 2018-09-17 DIAGNOSIS — R1084 Generalized abdominal pain: Secondary | ICD-10-CM

## 2018-09-17 DIAGNOSIS — Z79899 Other long term (current) drug therapy: Secondary | ICD-10-CM | POA: Insufficient documentation

## 2018-09-17 DIAGNOSIS — J45909 Unspecified asthma, uncomplicated: Secondary | ICD-10-CM | POA: Insufficient documentation

## 2018-09-17 DIAGNOSIS — T8130XA Disruption of wound, unspecified, initial encounter: Secondary | ICD-10-CM | POA: Diagnosis not present

## 2018-09-17 DIAGNOSIS — Z7982 Long term (current) use of aspirin: Secondary | ICD-10-CM | POA: Diagnosis not present

## 2018-09-17 DIAGNOSIS — E1122 Type 2 diabetes mellitus with diabetic chronic kidney disease: Secondary | ICD-10-CM | POA: Insufficient documentation

## 2018-09-17 DIAGNOSIS — Z87891 Personal history of nicotine dependence: Secondary | ICD-10-CM | POA: Diagnosis not present

## 2018-09-17 DIAGNOSIS — Y658 Other specified misadventures during surgical and medical care: Secondary | ICD-10-CM | POA: Insufficient documentation

## 2018-09-17 DIAGNOSIS — I252 Old myocardial infarction: Secondary | ICD-10-CM | POA: Diagnosis not present

## 2018-09-17 DIAGNOSIS — Z7984 Long term (current) use of oral hypoglycemic drugs: Secondary | ICD-10-CM | POA: Diagnosis not present

## 2018-09-17 DIAGNOSIS — I130 Hypertensive heart and chronic kidney disease with heart failure and stage 1 through stage 4 chronic kidney disease, or unspecified chronic kidney disease: Secondary | ICD-10-CM | POA: Diagnosis not present

## 2018-09-17 DIAGNOSIS — I509 Heart failure, unspecified: Secondary | ICD-10-CM | POA: Insufficient documentation

## 2018-09-17 DIAGNOSIS — N189 Chronic kidney disease, unspecified: Secondary | ICD-10-CM | POA: Diagnosis not present

## 2018-09-17 DIAGNOSIS — R109 Unspecified abdominal pain: Secondary | ICD-10-CM | POA: Diagnosis present

## 2018-09-17 LAB — COMPREHENSIVE METABOLIC PANEL
ALT: 16 U/L (ref 0–44)
AST: 15 U/L (ref 15–41)
Albumin: 4.2 g/dL (ref 3.5–5.0)
Alkaline Phosphatase: 97 U/L (ref 38–126)
Anion gap: 11 (ref 5–15)
BUN: 15 mg/dL (ref 6–20)
CO2: 28 mmol/L (ref 22–32)
Calcium: 9.5 mg/dL (ref 8.9–10.3)
Chloride: 106 mmol/L (ref 98–111)
Creatinine, Ser: 0.94 mg/dL (ref 0.44–1.00)
GFR calc Af Amer: >60 mL/min (ref >60)
GFR calc non Af Amer: >60 mL/min (ref >60)
Glucose, Bld: 137 mg/dL — ABNORMAL HIGH (ref 70–99)
Potassium: 4.1 mmol/L (ref 3.5–5.1)
Sodium: 145 mmol/L (ref 135–145)
Total Bilirubin: 0.4 mg/dL (ref 0.3–1.2)
Total Protein: 7.8 g/dL (ref 6.5–8.1)

## 2018-09-17 LAB — CBC WITH DIFFERENTIAL/PLATELET
Abs Immature Granulocytes: 0.04 10*3/uL (ref 0.00–0.07)
Basophils Absolute: 0 10*3/uL (ref 0.0–0.1)
Basophils Relative: 0 %
Eosinophils Absolute: 0.1 10*3/uL (ref 0.0–0.5)
Eosinophils Relative: 1 %
HCT: 40.4 % (ref 36.0–46.0)
Hemoglobin: 13 g/dL (ref 12.0–15.0)
Immature Granulocytes: 1 %
Lymphocytes Relative: 22 %
Lymphs Abs: 1.9 10*3/uL (ref 0.7–4.0)
MCH: 31.3 pg (ref 26.0–34.0)
MCHC: 32.2 g/dL (ref 30.0–36.0)
MCV: 97.1 fL (ref 80.0–100.0)
Monocytes Absolute: 0.4 10*3/uL (ref 0.1–1.0)
Monocytes Relative: 4 %
Neutro Abs: 6.4 10*3/uL (ref 1.7–7.7)
Neutrophils Relative %: 72 %
Platelets: 261 10*3/uL (ref 150–400)
RBC: 4.16 MIL/uL (ref 3.87–5.11)
RDW: 14.6 % (ref 11.5–15.5)
WBC: 8.9 10*3/uL (ref 4.0–10.5)
nRBC: 0 % (ref 0.0–0.2)

## 2018-09-17 LAB — LIPASE, BLOOD: Lipase: 48 U/L (ref 11–51)

## 2018-09-17 LAB — LACTIC ACID, PLASMA
Lactic Acid, Venous: 1.6 mmol/L (ref 0.5–1.9)
Lactic Acid, Venous: 0.8 mmol/L (ref 0.5–1.9)

## 2018-09-17 MED ORDER — MORPHINE SULFATE (PF) 4 MG/ML IV SOLN
4.0000 mg | Freq: Once | INTRAVENOUS | Status: AC
  Administered 2018-09-17: 4 mg via INTRAVENOUS
  Filled 2018-09-17: qty 1

## 2018-09-17 MED ORDER — IOHEXOL 240 MG/ML SOLN
50.0000 mL | Freq: Once | INTRAMUSCULAR | Status: AC
  Administered 2018-09-17: 50 mL via ORAL

## 2018-09-17 MED ORDER — IOHEXOL 300 MG/ML  SOLN
100.0000 mL | Freq: Once | INTRAMUSCULAR | Status: AC | PRN
  Administered 2018-09-17: 100 mL via INTRAVENOUS

## 2018-09-17 MED ORDER — SODIUM CHLORIDE 0.9 % IV BOLUS
1000.0000 mL | Freq: Once | INTRAVENOUS | Status: AC
  Administered 2018-09-17: 11:00:00 1000 mL via INTRAVENOUS

## 2018-09-17 NOTE — ED Notes (Signed)
Pt provided with a meal tray.

## 2018-09-17 NOTE — Discharge Instructions (Addendum)
For your wound care, please continue wet-to-dry dressings over the area, keep the area clean. You may wash it once a day with soapy water.  Please follow the hernia precautions we discussed.  Please call to make a follow-up appointment with Dr. Launa Flight in his office soon as possible.  Return to the ER for any new or worsening symptoms.

## 2018-09-17 NOTE — ED Notes (Signed)
Pt transported to CT ?

## 2018-09-17 NOTE — ED Triage Notes (Signed)
Pt here stating "i've pulled something in my stomach when I moved wrong and it's hurting," also c/o incision from a hernia repair a year ago "busted" and is oozing now, pt placed a band aid on it, NAD.

## 2018-09-17 NOTE — ED Provider Notes (Signed)
[facility] Emergency Department Provider Note  ____________________________________________   I have reviewed the triage vital signs and the nursing notes.  HISTORY  Chief Complaint Abdominal Pain and Open abdominal incision    HPI Felisha Claytor is a 48 y.o. female with a history of ventral hernia repair with mesh at [facility] in June 2019 with Dr. Launa Flight who presents to the emergency department for abdominal pain.  Patient states this morning she was twisting and she felt a sudden popping in her abdomen.  Her incisional site dehisced very superficially, with associated pain.  No nausea or vomiting.  She was otherwise well before this morning.  No sick contacts.  Last ate last night.         Past Medical Hx Past Medical History:  Diagnosis Date  . Anemia   . Anxiety   . Asthma   . Bipolar disorder ([place])   . CAD (coronary artery disease) unk  . CHF (congestive heart failure) ([place])   . COPD (chronic obstructive pulmonary disease) ([place])   . Depression unk  . Diabetes mellitus without complication ([place])   . Diabetes mellitus, type II ([place])   . Drug overdose   . GERD (gastroesophageal reflux disease)   . Headache   . Hyperlipidemia   . Hypertension   . Left leg pain 04/29/2014  . MI (myocardial infarction) ([place])   . Muscle ache 09/16/2014  . Osteoporosis   . Overactive bladder   . Pancreatitis unk  . Reflex sympathetic dystrophy   . Renal insufficiency   . Sleep apnea    pt reported on 2/6/7 she currently is not using CPAP  . Sleep apnea   . Stroke ([place])   . Thyroid disease   . TIA (transient ischemic attack) unk  . TIA (transient ischemic attack)     Problem List Patient Active Problem List   Diagnosis Date Noted  . Bipolar I disorder, most recent episode mixed ([place]) 09/10/2018  . Panic attacks 09/10/2018  . Chronic lumbosacral L5-S1 IVD protrusion (Bilateral) 08/25/2018  . Lumbar lateral recess stenosis (L5-S1) (Bilateral) 08/25/2018  . Wound  disruption 08/05/2018  . Osteoarthritis involving multiple joints 07/10/2018  . Long term current use of non-steroidal anti-inflammatories (NSAID) 07/10/2018  . NSAID induced gastritis 07/10/2018  . DDD (degenerative disc disease), lumbosacral 04/22/2018  . Disease related peripheral neuropathy 11/13/2017  . Gastroesophageal reflux disease without esophagitis 11/13/2017  . Occipital headache (Bilateral) 11/13/2017  . Cervicogenic headache (Bilateral) (L>R) 11/13/2017  . History of postoperative nausea 10/22/2017  . Chronic shoulder pain (Fifth Area of Pain) (Bilateral) (L>R) 10/09/2017  . CRPS (complex regional pain syndrome) type 1 of lower limb (left ankle) 10/09/2017  . Ankle joint instability (Left) 10/09/2017  . Ankle sprain, sequela (Left) 10/09/2017  . History of psychiatric symptoms 10/09/2017  . Chronic pain syndrome 10/02/2017  . Spondylosis without myelopathy or radiculopathy, lumbosacral region 10/02/2017  . Chronic musculoskeletal pain 10/02/2017  . Elevated C-reactive protein (CRP) 09/10/2017  . Elevated sed rate 09/10/2017  . Chronic neck pain (Fourth Area of Pain) (Bilateral) (L>R) 09/09/2017  . Pharmacologic therapy 09/09/2017  . Disorder of skeletal system 09/09/2017  . Problems influencing health status 09/09/2017  . Long term current use of opiate analgesic 09/09/2017  . Tobacco use disorder 07/16/2017  . Ventral hernia without obstruction or gangrene 06/25/2017  . Strain of extensor muscle, fascia and tendon of left index finger at wrist and hand level, initial encounter 05/16/2017  . Sepsis ([place]) 04/14/2017  .  Osteopenia 04/03/2017  . Hypotension 09/17/2016  . Contusion of knee (Left) 10/12/2015  . Strain of knee (Left) 10/12/2015  . Incidental lung nodule 04/28/2015  . Chronic pain 03/28/2015  . Chronic low back pain (Primary Area of Pain) (Bilateral) (L>R) 03/28/2015  . Chronic lower extremity pain (Referred) (Secondary Area of Pain) (Left) 03/28/2015  .  Abdominal wound dehiscence 03/28/2015  . Encounter for pain management planning 03/28/2015  . Morbid obesity ([place]) 03/28/2015  . Abnormal CT scan, lumbar spine 03/28/2015  . Lumbar facet hypertrophy 03/28/2015  . Lumbar facet syndrome (Bilateral) (L>R) 03/28/2015  . Lumbar foraminal stenosis (Bilateral) (L5-S1) 03/28/2015  . Chronic ankle pain [facility] Area of Pain) (Left) 03/28/2015  . Neurogenic pain 03/28/2015  . Neuropathic pain 03/28/2015  . Myofascial pain 03/28/2015  . History of suicide attempt 03/28/2015  . PTSD (post-traumatic stress disorder) 01/13/2015  . Abnormal gait 12/15/2014  . Congestive heart failure ([place]) 11/15/2014  . Abdominal wall abscess 09/20/2014  . 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 Diabetes mellitus, type 2 ([place]) 08/13/2014  . Bipolar affective disorder ([place]) 08/13/2014  . Type 2 diabetes mellitus ([place]) 08/13/2014  . Rectal prolapse 08/09/2014  . Rectal bleeding 08/09/2014  . Rectal bleed 08/09/2014  . Affective bipolar disorder ([place]) 08/05/2014  . Arteriosclerosis of coronary artery 08/05/2014  . CCF (congestive cardiac failure) ([place]) 08/05/2014  . Chronic kidney disease 08/05/2014  . 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 Apnea, sleep 08/05/2014  . Temporary cerebral vascular dysfunction 08/05/2014  . Polypharmacy 04/29/2014  . Other long term (current) drug therapy 04/29/2014  . 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 Chronic kidney disease, stage III (moderate) ([place]) 12/14/2013  . Controlled diabetes mellitus type II without complication ([place]) 76/54/6503  . Essential (primary) hypertension 12/03/2013  . Adult hypothyroidism 12/03/2013  . Controlled type 2 diabetes mellitus without complication ([place]) 54/65/6812    Past Surgical Hx Past Surgical History:  Procedure Laterality Date  . ABDOMINAL HYSTERECTOMY    . HERNIA REPAIR  07/15/2017  . prolapse rectum surgery N/A July 2016  . TONSILLECTOMY      Medications Prior to Admission  medications   Medication Sig Start Date End Date Taking? Authorizing Provider  ACCU-CHEK AVIVA PLUS test strip  05/03/18   [provider]  acetaminophen (TYLENOL) 325 MG tablet Take 1 tablet (325 mg total) by mouth every 6 (six) hours as needed for mild pain (or Fever >/= 101). 09/18/16   Gouru, Aruna, MD  aspirin EC 81 MG tablet Take 81 mg by mouth daily at 12 noon.    [provider]  atorvastatin (LIPITOR) 40 MG tablet Take 40 mg by mouth at bedtime.     [provider]  azelastine (ASTELIN) 0.1 % nasal spray Place 1 spray into both nostrils daily.    [provider]  busPIRone (BUSPAR) 10 MG tablet Take 2 tablets (20 mg total) by mouth 3 (three) times daily. 07/08/18   Ursula Alert, MD  CHANTIX 0.5 MG tablet  09/03/18   [provider]  dicyclomine (BENTYL) 10 MG capsule  05/20/18   [provider]  diphenhydrAMINE (BENADRYL) 25 mg capsule TAKE 1 CAPSULE BY MOUTH TWICE DAILY AS NEEDED FOR ITCHING 09/09/18   Ursula Alert, MD  diphenoxylate-atropine (LOMOTIL) 2.5-0.025 MG tablet Take by mouth. 12/25/17 11/29/18  [provider]  divalproex (DEPAKOTE) 250 MG DR tablet Take 1 tablet (250 mg total) by mouth 3 (three) times daily. 09/15/18   Ursula Alert, MD  Dulaglutide 0.75 MG/0.5ML SOPN Inject into  the skin. 07/15/18   [provider]  ipratropium (ATROVENT) 0.03 % nasal spray Place into the nose. 08/10/16 01/06/19  [provider]  KLOR-CON M20 20 MEQ tablet Take 20 mEq by mouth daily.  10/13/15   [provider]  Lancets (ACCU-CHEK SAFE-T PRO) lancets  09/30/17   [provider]  levocetirizine (XYZAL) 5 MG tablet  06/04/18   [provider]  levothyroxine (SYNTHROID) 25 MCG tablet  09/03/18   [provider]  levothyroxine (SYNTHROID, LEVOTHROID) 200 MCG tablet Take 200 mcg by mouth daily.     [provider]  Magnesium Oxide 500 MG CAPS Take 1 capsule (500 mg total) by mouth  2 (two) times daily at 8 am and 10 pm. 07/30/18 10/28/18  Milinda Pointer, MD  meloxicam (MOBIC) 15 MG tablet Take 1 tablet (15 mg total) by mouth daily with breakfast for 20 days. Max: 1/day 10/08/18 10/28/18  Milinda Pointer, MD  metFORMIN (GLUCOPHAGE) 500 MG tablet  09/03/18   [provider]  methocarbamol (ROBAXIN) 750 MG tablet Take 1 tablet (750 mg total) by mouth every 8 (eight) hours as needed for muscle spasms. 07/30/18 10/28/18  Milinda Pointer, MD  montelukast (SINGULAIR) 10 MG tablet Take 10 mg by mouth daily.     [provider]  MYRBETRIQ 50 MG TB24 tablet  12/19/17   [provider]  naproxen (NAPROSYN) 500 MG tablet  09/01/18   [provider]  NICOTROL 10 MG inhaler  11/11/17   [provider]  nitroGLYCERIN (NITROSTAT) 0.4 MG SL tablet Place under the tongue. 05/14/17   [provider]  omega-3 acid ethyl esters (LOVAZA) 1 g capsule  08/15/17   [provider]  omeprazole (PRILOSEC) 20 MG capsule Take 1 capsule (20 mg total) by mouth at bedtime for 20 days. 10/08/18 10/28/18  Milinda Pointer, MD  ondansetron (ZOFRAN-ODT) 8 MG disintegrating tablet Take 8 mg by mouth as needed. 01/21/18   [provider]  pregabalin (LYRICA) 75 MG capsule Take 1 capsule (75 mg total) by mouth 2 (two) times daily for 15 days, THEN 1 capsule (75 mg total) 3 (three) times daily for 15 days. 08/25/18 09/24/18  Milinda Pointer, MD  prochlorperazine (COMPAZINE) 10 MG tablet  06/04/18   [provider]  propranolol (INDERAL) 20 MG tablet Take 1 tablet (20 mg total) by mouth 3 (three) times daily as needed. anxiety 07/24/18   Ursula Alert, MD  QUEtiapine (SEROQUEL) 100 MG tablet Take 1-1.5 tablets (100-150 mg total) by mouth at bedtime as needed. 09/10/18   Ursula Alert, MD  rizatriptan (MAXALT) 10 MG tablet Take 1 tablet (10 mg total) by mouth as needed. 06/03/18   Vevelyn Francois, NP  torsemide [facility]) 20 MG tablet Take by  mouth. 06/09/18 06/09/19  [provider]  traMADol (ULTRAM) 50 MG tablet Take 1 tablet (50 mg total) by mouth every 6 (six) hours as needed for severe pain. Max: 4/day 07/30/18 10/28/18  Milinda Pointer, MD  TRELEGY ELLIPTA 100-62.5-25 MCG/INH AEPB  08/19/17   [provider]  VENTOLIN HFA 108 [phone] Base) MCG/ACT inhaler  05/03/18   [provider]    Allergies Diazepam, Levofloxacin, Metronidazole, Pregabalin, Sulfa antibiotics, Cephalexin, Ciprofloxacin, Ziprasidone hcl, and Penicillins  Family Hx Family History  Problem Relation Age of Onset  . Diabetes Mellitus II Mother   . CAD Mother   . Sleep apnea Mother   . Osteoarthritis Mother   . Osteoporosis Mother   . Anxiety disorder  Mother   . Depression Mother   . Bipolar disorder Mother   . Bipolar disorder Father   . Hypertension Father   . Depression Father   . Anxiety disorder Father   . Post-traumatic stress disorder Sister     Social Hx Social History   Tobacco Use  . Smoking status: Former Smoker    Packs/day: 0.50    Types: Cigarettes  . Smokeless tobacco: Never Used  Substance Use Topics  . Alcohol use: No    Alcohol/week: 0.0 standard drinks  . Drug use: No    Review of Systems  Constitutional: Negative for fever. Negative for chills. Eyes: Negative for visual changes. ENT: Negative for sore throat. Cardiovascular: Negative for chest pain. Respiratory: Negative for shortness of breath. Gastrointestinal: Positive for abdominal pain. Negative for nausea. Negative for vomiting. Genitourinary: Negative for dysuria. Musculoskeletal: Negative for leg swelling. Skin: Positive for superficial wound dehiscence. Neurological: Negative for for headaches.  ____________________________________________   PHYSICAL EXAM:  Vital Signs: ED Triage Vitals  Enc Vitals Group     BP 09/17/18 0959 115/64     Pulse Rate 09/17/18 0959 (!) 109     Resp 09/17/18 0959 16     Temp 09/17/18 0959 98.8  F (37.1 C)     Temp Source 09/17/18 0959 Oral     SpO2 09/17/18 0959 97 %     Weight 09/17/18 1000 225 lb (102.1 kg)     Height 09/17/18 1000 5\' 4"  (1.626 m)     Head Circumference --      Peak Flow --      Pain Score 09/17/18 0959 10     Pain Loc --      Pain Edu? --      Excl. in [place]? --     Constitutional: Alert and oriented.   Eyes: Conjunctivae are normal. Sclera anicteric. Head: Normocephalic. Atraumatic. Nose: No congestion. No rhinorrhea. Mouth/Throat: Mucous membranes are moist.  Neck: No stridor.   Cardiovascular: Normal rate, regular rhythm. Grossly normal heart sounds. Extremities well perfused. Respiratory: Normal respiratory effort.  Lungs CTAB. Gastrointestinal: Soft, tender to palpation surrounding her hernia incision site dehiscence. No rebound or guarding. Musculoskeletal: No lower extremity edema. Neurologic:  Normal speech and language. No gross focal neurologic deficits are appreciated.  Skin: Distal end of her old vertical surgical incision site is very superficially dehisced (length ~2.5 cm, depth ~0.5 cm) with some dried blood. No active bleeding. Psychiatric: Mood and affect are appropriate for situation.   ____________________________________________  RADIOLOGY  CT A/P: IMPRESSION: 1. Tiny periumbilical anterior abdominal wall hernia which contains a short segment of small bowel. The segment of bowel within the hernial sac has normal appearance, however given the narrow neck of the hernia, there is potential for incarceration. 2. Otherwise no evidence of acute abnormalities within the abdomen or pelvis. 3. Stable 2 cm left adrenal mass.    ____________________________________________   PROCEDURES  Procedure(s) performed (including Critical Care):  Procedures   ____________________________________________   INITIAL IMPRESSION / ASSESSMENT AND PLAN / ED COURSE  48 y.o. female with a history of ventral hernia repair with mesh at [facility]  in June 2019 with Dr. Launa Flight who presents to the emergency department for superficial dehiscence of her distal vertical incision site.  On exam, she has superficial dehiscence as described above with associated discomfort w/ palpation. No rebound or guarding. No distention.   Ddx includes superficial dehiscence (though on chart review this does appear to be somewhat recurrent/chronic in  nature, with frequent dehiscence requiring wet/dry dressings, with plans for possible OR intervention later this month with Dr. Launa Flight). Given the "popping sensation" with associated pain, also consider recurrence of her hernia or complications from the mesh.  We will plan for labs, symptom control, CT scan, and reassess.  CT scan notable for a very tiny periumbilical anterior abdominal wall hernia, segment of bowel is normal in appearance.  This in combination with a normal WBC count and lactate is reassuring.  With regards to her dehiscence, discussed with our surgeons here who recommend consulting Dr. Launa Flight at [facility] for potential transfer vs further outpatient management, given her history. Patient also requests this as well.. Will pursue.   3:40 PM Discussed results and case with patient's surgeon at [facility] for potential transfer, Dr. Launa Flight, who states this has been an ongoing chronic issue, and he will follow-up with her in clinic. He does not feel she needs acute transfer to [facility] at this time given this is a chronic recurrence. They plan to potentially address her wound in the OR later this month.   Update patient on discussion.  Her pain is improved and she has tolerated food here.  She is comfortable with the plan and discharge.  She will follow-up with Dr. Launa Flight as an outpatient.  Given return precautions.  ____________________________________________   FINAL CLINICAL IMPRESSION(S) / ED DIAGNOSES  Final diagnoses:  Wound dehiscence  Generalized abdominal pain      Note:  This document was prepared  using Dragon voice recognition software and may include unintentional dictation errors.   Lilia Pro., MD 09/17/18 [id number]

## 2018-09-17 NOTE — ED Notes (Signed)
This RN replace abd dressing at this time per pt's request.

## 2018-09-18 ENCOUNTER — Telehealth: Payer: Self-pay | Admitting: Pain Medicine

## 2018-09-18 ENCOUNTER — Ambulatory Visit: Payer: Medicare HMO | Attending: Pain Medicine | Admitting: Pain Medicine

## 2018-09-18 ENCOUNTER — Encounter: Payer: Self-pay | Admitting: Pain Medicine

## 2018-09-18 DIAGNOSIS — M25572 Pain in left ankle and joints of left foot: Secondary | ICD-10-CM

## 2018-09-18 DIAGNOSIS — M79605 Pain in left leg: Secondary | ICD-10-CM

## 2018-09-18 DIAGNOSIS — M545 Low back pain, unspecified: Secondary | ICD-10-CM

## 2018-09-18 DIAGNOSIS — Z791 Long term (current) use of non-steroidal anti-inflammatories (NSAID): Secondary | ICD-10-CM

## 2018-09-18 DIAGNOSIS — K2960 Other gastritis without bleeding: Secondary | ICD-10-CM

## 2018-09-18 DIAGNOSIS — K219 Gastro-esophageal reflux disease without esophagitis: Secondary | ICD-10-CM

## 2018-09-18 DIAGNOSIS — G894 Chronic pain syndrome: Secondary | ICD-10-CM

## 2018-09-18 DIAGNOSIS — T39395A Adverse effect of other nonsteroidal anti-inflammatory drugs [NSAID], initial encounter: Secondary | ICD-10-CM

## 2018-09-18 DIAGNOSIS — G8929 Other chronic pain: Secondary | ICD-10-CM

## 2018-09-18 MED ORDER — TRAMADOL HCL 50 MG PO TABS: 50.0000 mg | ORAL_TABLET | Freq: Four times a day (QID) | ORAL | 2 refills | Status: DC | PRN

## 2018-09-18 NOTE — Progress Notes (Signed)
Pain Management Virtual Encounter Note - Virtual Visit via Telephone Telehealth (real-time audio visits between healthcare provider and patient).   Patient's Phone No. & Preferred Pharmacy:  [phone] (home); [phone] (mobile); (Preferred) [phone] [email or web address]  [place] [address] [place] [address] Phone: [phone] Fax: [phone]    Pre-screening note:  Our staff contacted Marie Clarke and offered her an "in person", "face-to-face" appointment versus a telephone encounter. She indicated preferring the telephone encounter, at this time.   Reason for Virtual Visit: COVID-19*  Social distancing based on CDC and AMA recommendations.   I contacted [name] on 09/18/2018 via telephone.      I clearly identified myself as Gaspar Cola, MD. I verified that I was speaking with the correct person using two identifiers (Name: Marie Clarke, and date of birth: 1970-10-28).  Advanced Informed Consent I sought verbal advanced consent from [facility] for virtual visit interactions. I informed Marie Clarke of possible security and privacy concerns, risks, and limitations associated with providing "not-in-person" medical evaluation and management services. I also informed Marie Clarke of the availability of "in-person" appointments. Finally, I informed her that there would be a charge for the virtual visit and that she could be  personally, fully or partially, financially responsible for it. [name] expressed understanding and agreed to proceed.   Historic Elements   [name] is a 48 y.o. year old, female patient evaluated today after her last encounter by our practice on 09/11/2018. Ms. Cantrelle  has a past medical history of Anemia, Anxiety, Asthma, Bipolar disorder ([place]), CAD (coronary artery disease) ([place]), CHF (congestive heart failure) ([place]), COPD (chronic obstructive pulmonary disease) ([place]), Depression ([place]), Diabetes  mellitus without complication ([place]), Diabetes mellitus, type II ([place]), Drug overdose, GERD (gastroesophageal reflux disease), Headache, Hyperlipidemia, Hypertension, Left leg pain (04/29/2014), MI (myocardial infarction) ([place]), Muscle ache (09/16/2014), Osteoporosis, Overactive bladder, Pancreatitis ([place]), Reflex sympathetic dystrophy, Renal insufficiency, Sleep apnea, Sleep apnea, Stroke ([place]), Thyroid disease, TIA (transient ischemic attack) ([place]), and TIA (transient ischemic attack). She also  has a past surgical history that includes prolapse rectum surgery (N/A, July 2016); Abdominal hysterectomy; Tonsillectomy; and Hernia repair (07/15/2017). [name] has a current medication list which includes the following prescription(s): accu-chek aviva plus, acetaminophen, aspirin ec, atorvastatin, azelastine, buspirone, chantix, dicyclomine, diphenhydramine, diphenoxylate-atropine, divalproex, dulaglutide, ipratropium, klor-con m20, accu-chek safe-t pro, levocetirizine, levothyroxine, levothyroxine, magnesium oxide, meloxicam, metformin, methocarbamol, montelukast, myrbetriq, naproxen, nicotrol, nitroglycerin, omega-3 acid ethyl esters, omeprazole, ondansetron, pregabalin, prochlorperazine, propranolol, quetiapine, rizatriptan, torsemide, tramadol, trelegy ellipta, and ventolin hfa. She  reports that she has quit smoking. Her smoking use included cigarettes. She smoked 0.50 packs per day. She has never used smokeless tobacco. She reports that she does not drink alcohol or use drugs. Marie Clarke is allergic to diazepam; levofloxacin; metronidazole; pregabalin; sulfa antibiotics; cephalexin; ciprofloxacin; ziprasidone hcl; and penicillins.   HPI  Today, she is being contacted for a post-procedure assessment.  The patient indicates not having had any significant benefit from the interventional therapy.  She has been looking that all her options and she wants to know about the possibility of an implant.  Today I have given her  some information regarding that and to start with we will be sending her to have a medical psychology evaluation to see if she would be a good candidate for implant therapy.  Post-Procedure Evaluation  Procedure: (R) L4-5 LESI #1 + (L) L5 TFESI #1.  Pre-procedure pain level:  9/10 Post-procedure: 0/10 (  100% relief)  Sedation: Sedation provided.  Effectiveness during initial hour after procedure(Ultra-Short Term Relief): 100 %   Local anesthetic used: Long-acting (4-6 hours) Effectiveness: Defined as any analgesic benefit obtained secondary to the administration of local anesthetics. This carries significant diagnostic value as to the etiological location, or anatomical origin, of the pain. Duration of benefit is expected to coincide with the duration of the local anesthetic used.  Effectiveness during initial 4-6 hours after procedure(Short-Term Relief): 100 %   Long-term benefit: Defined as any relief past the pharmacologic duration of the local anesthetics.  Effectiveness past the initial 6 hours after procedure(Long-Term Relief): 0 %   Current benefits: Defined as benefit that persist at this time.   Analgesia:  Back to baseline Function: Back to baseline ROM: Back to baseline  Pharmacotherapy Assessment  Analgesic: Tramadol 50 mg, 1-2 tab q 6 hrs (200-400 mg/day of tramadol) MME/day:20-40mg /day.   Monitoring: Pharmacotherapy: No side-effects or adverse reactions reported. [place] PMP: PDMP reviewed during this encounter.       Compliance: No problems identified. Effectiveness: Clinically acceptable. Plan: Refer to "POC".  UDS:  Summary  Date Value Ref Range Status  01/22/2018 FINAL  Final    Comment:    ==================================================================== TOXASSURE SELECT 13 (MW) ==================================================================== Test                             Result       Flag       Units Drug Present and Declared for Prescription  Verification   Tramadol                       2742         EXPECTED   ng/mg creat   O-Desmethyltramadol            5630         EXPECTED   ng/mg creat   N-Desmethyltramadol            2591         EXPECTED   ng/mg creat    Source of tramadol is a prescription medication.    O-desmethyltramadol and N-desmethyltramadol are expected    metabolites of tramadol. ==================================================================== Test                      Result    Flag   Units      Ref Range   Creatinine              66               mg/dL      >=20 ==================================================================== Declared Medications:  The flagging and interpretation on this report are based on the  following declared medications.  Unexpected results may arise from  inaccuracies in the declared medications.  **Note: The testing scope of this panel includes these medications:  Tramadol  **Note: The testing scope of this panel does not include following  reported medications:  Acetaminophen  Aspirin (Aspirin 81)  Atorvastatin  Atropine (Lomotil)  Azelastine  Bumetanide  Buspirone  Diphenoxylate (Lomotil)  Fluticasone (Trelegy Ellipta)  Gabapentin  Hydroxyzine (Vistaril)  Lamotrigine  Levocetirizine (Xyzal)  Levothyroxine  Magnesium Oxide  Metformin  Methocarbamol  Mirabegron (Myrbetriq)  Montelukast  Nicotine  Omega-3 Fatty Acids  Ondansetron  Potassium  Potassium (Klor-Con)  Promethazine  Quetiapine  Rizatriptan  Umeclidinium (Trelegy Ellipta)  Vilanterol (Trelegy Ellipta) ==================================================================== For clinical  consultation, please call [phone]. ====================================================================    Laboratory Chemistry Profile (12 mo)  Renal: 09/17/2018: BUN 15; Creatinine, Ser 0.94  Lab Results  Component Value Date   GFRAA >60 09/17/2018   GFRNONAA >60 09/17/2018   Hepatic: 09/17/2018: Albumin  4.2 Lab Results  Component Value Date   AST 15 09/17/2018   ALT 16 09/17/2018   Other: No results found for requested labs within last 8760 hours. Note: Above Lab results reviewed.  Imaging  Last 90 days:  Ct Abdomen Pelvis W Contrast  Result Date: 09/17/2018 CLINICAL DATA:  Acute generalized abdominal pain. EXAM: CT ABDOMEN AND PELVIS WITH CONTRAST TECHNIQUE: Multidetector CT imaging of the abdomen and pelvis was performed using the standard protocol following bolus administration of intravenous contrast. CONTRAST:  113mL OMNIPAQUE IOHEXOL 300 MG/ML  SOLN COMPARISON:  September 06, 2017 FINDINGS: Lower chest: No acute abnormality. Hepatobiliary: No focal liver abnormality is seen. No gallstones, gallbladder wall thickening, or biliary dilatation. Pancreas: Unremarkable. No pancreatic ductal dilatation or surrounding inflammatory changes. Spleen: Normal in size without focal abnormality. Adrenals/Urinary Tract: 2 cm intermediate density left adrenal mass, measuring 44 Hounsfield units. Stable in size from July 2019 and March 2018. Normal appearance of the bilateral kidneys and urinary bladder. Stomach/Bowel: Stomach is within normal limits. Appendix appears normal. No evidence of bowel wall thickening, distention, or inflammatory changes. Postsurgical changes of the sigmoid colon without complicating features. Vascular/Lymphatic: No significant vascular findings are present. No enlarged abdominal or pelvic lymph nodes. Reproductive: Status post hysterectomy. No adnexal masses. Other: For current tiny periumbilical anterior abdominal wall hernia which contains a short segment of small bowel. The segment of bowel within the hernial sac has normal appearance, however given the narrow neck of the hernia, there is potential for incarceration. Musculoskeletal: No acute or significant osseous findings. IMPRESSION: 1. Tiny periumbilical anterior abdominal wall hernia which contains a short segment of small bowel. The  segment of bowel within the hernial sac has normal appearance, however given the narrow neck of the hernia, there is potential for incarceration. 2. Otherwise no evidence of acute abnormalities within the abdomen or pelvis. 3. Stable 2 cm left adrenal mass. Electronically Signed   By: Fidela Salisbury M.D.   On: 09/17/2018 13:14   [facility] C-arm 1-60 Min No Report  Result Date: 09/04/2018 Fluoro was used, but no Radiologist interpretation will be provided. Please refer to "NOTES" tab for provider progress note.  Last Hospital Admission:  Ct Abdomen Pelvis W Contrast  Result Date: 09/17/2018 CLINICAL DATA:  Acute generalized abdominal pain. EXAM: CT ABDOMEN AND PELVIS WITH CONTRAST TECHNIQUE: Multidetector CT imaging of the abdomen and pelvis was performed using the standard protocol following bolus administration of intravenous contrast. CONTRAST:  110mL OMNIPAQUE IOHEXOL 300 MG/ML  SOLN COMPARISON:  September 06, 2017 FINDINGS: Lower chest: No acute abnormality. Hepatobiliary: No focal liver abnormality is seen. No gallstones, gallbladder wall thickening, or biliary dilatation. Pancreas: Unremarkable. No pancreatic ductal dilatation or surrounding inflammatory changes. Spleen: Normal in size without focal abnormality. Adrenals/Urinary Tract: 2 cm intermediate density left adrenal mass, measuring 44 Hounsfield units. Stable in size from July 2019 and March 2018. Normal appearance of the bilateral kidneys and urinary bladder. Stomach/Bowel: Stomach is within normal limits. Appendix appears normal. No evidence of bowel wall thickening, distention, or inflammatory changes. Postsurgical changes of the sigmoid colon without complicating features. Vascular/Lymphatic: No significant vascular findings are present. No enlarged abdominal or pelvic lymph nodes. Reproductive: Status post hysterectomy. No adnexal masses. Other:  For current tiny periumbilical anterior abdominal wall hernia which contains a short  segment of small bowel. The segment of bowel within the hernial sac has normal appearance, however given the narrow neck of the hernia, there is potential for incarceration. Musculoskeletal: No acute or significant osseous findings. IMPRESSION: 1. Tiny periumbilical anterior abdominal wall hernia which contains a short segment of small bowel. The segment of bowel within the hernial sac has normal appearance, however given the narrow neck of the hernia, there is potential for incarceration. 2. Otherwise no evidence of acute abnormalities within the abdomen or pelvis. 3. Stable 2 cm left adrenal mass. Electronically Signed   By: Fidela Salisbury M.D.   On: 09/17/2018 13:14   Assessment  The primary encounter diagnosis was Chronic pain syndrome. Diagnoses of Chronic low back pain (Primary Area of Pain) (Bilateral) (L>R), Chronic lower extremity pain (Referred) (Secondary Area of Pain) (Left), Chronic ankle pain (Tertiary Area of Pain) (Left), Gastroesophageal reflux disease without esophagitis, Long term current use of non-steroidal anti-inflammatories (NSAID), and NSAID induced gastritis were also pertinent to this visit.  Plan of Care  I am having [facility] maintain her atorvastatin, levothyroxine, montelukast, aspirin EC, azelastine, Klor-Con M20, acetaminophen, Trelegy Ellipta, Nicotrol, Myrbetriq, accu-chek safe-t pro, diphenoxylate-atropine, ondansetron, Ventolin HFA, Accu-Chek Aviva Plus, rizatriptan, dicyclomine, ipratropium, levocetirizine, nitroGLYCERIN, omega-3 acid ethyl esters, prochlorperazine, torsemide, busPIRone, methocarbamol, Magnesium Oxide, traMADol, Dulaglutide, propranolol, omeprazole, meloxicam, pregabalin, diphenhydrAMINE, naproxen, levothyroxine, metFORMIN, Chantix, QUEtiapine, and divalproex.  Pharmacotherapy (Medications Ordered): No orders of the defined types were placed in this encounter.  Orders:  No orders of the defined types were placed in this encounter.  Follow-up  plan:   No follow-ups on file.      Interventional management options:  Considering:   Possible lumbar spinal cord stimulator trial Diagnosticbilateral lumbar facet nerve block Possible bilateral lumbar facetRFA Diagnostic left-sided L5-S1 transforaminal ESI Diagnostic left-sided L5 selective nerve root block Possible left-sided L5 DRG RFA Diagnostic left-sidedlumbarsympathetic nerve block Possible left-sided lumbar sympathetic RFA Diagnostic bilateral cervical facet nerve block Possible bilateral cervical facetRFA Diagnosticleft intra-articular shoulder injection Diagnosticleft suprascapular nerve block Diagnostic left suprascapularRFA   Palliative PRN treatment(s):   Diagnostic left L5 TFESI #2 + right L4-5 LESI #2  Palliative bilateral lumbar facet blocks Palliative right-sided lumbar facet RFA #2 (last done 04/22/2018) Palliative left-sided lumbar facet RFA #2 (last done 03/13/2018)      Recent Visits Date Type Provider Dept  09/04/18 Procedure visit Milinda Pointer, MD [facility]  08/25/18 Office Visit Milinda Pointer, MD [facility]  08/04/18 Office Visit Milinda Pointer, MD [facility]  07/10/18 Office Visit Milinda Pointer, MD [facility]  Showing recent visits within past 90 days and meeting all other requirements   Today's Visits Date Type Provider Dept  09/18/18 Office Visit Milinda Pointer, MD [facility]  Showing today's visits and meeting all other requirements   Future Appointments Date Type Provider Dept  10/15/18 Appointment Milinda Pointer, MD [facility]  Showing future appointments within next 90 days and meeting all other requirements   I discussed the assessment and treatment plan with the patient. The patient was provided an opportunity to ask questions and all were answered. The patient agreed with the plan and demonstrated an understanding of the  instructions.  Patient advised to call back or seek an in-person evaluation if the symptoms or condition worsens.  Total duration of non-face-to-face encounter: 15 minutes.  Note by: Gaspar Cola, MD Date: 09/18/2018; Time: 1:19  PM  Note: This dictation was prepared with Dragon dictation. Any transcriptional errors that may result from this process are unintentional.  Disclaimer:  * Given the special circumstances of the COVID-19 pandemic, the federal government has announced that the Office for Civil Rights (OCR) will exercise its enforcement discretion and will not impose penalties on physicians using telehealth in the event of noncompliance with regulatory requirements under the [place] (HIPAA) in connection with the good faith provision of telehealth during the JOITG-54 national public health emergency. ([place])

## 2018-09-18 NOTE — Addendum Note (Signed)
Addended by: Milinda Pointer A on: 09/18/2018 02:20 PM   Modules accepted: Orders

## 2018-09-18 NOTE — Telephone Encounter (Signed)
Pharmacy called. They have question about script for tramadol just sent in

## 2018-09-19 ENCOUNTER — Other Ambulatory Visit: Payer: Self-pay | Admitting: Psychiatry

## 2018-09-19 DIAGNOSIS — F410 Panic disorder [episodic paroxysmal anxiety] without agoraphobia: Secondary | ICD-10-CM

## 2018-09-25 ENCOUNTER — Other Ambulatory Visit: Payer: Self-pay | Admitting: Pain Medicine

## 2018-09-25 DIAGNOSIS — M792 Neuralgia and neuritis, unspecified: Secondary | ICD-10-CM

## 2018-09-25 DIAGNOSIS — K219 Gastro-esophageal reflux disease without esophagitis: Secondary | ICD-10-CM

## 2018-09-25 DIAGNOSIS — Z791 Long term (current) use of non-steroidal anti-inflammatories (NSAID): Secondary | ICD-10-CM

## 2018-09-25 DIAGNOSIS — K2960 Other gastritis without bleeding: Secondary | ICD-10-CM

## 2018-09-29 ENCOUNTER — Observation Stay
Admission: EM | Admit: 2018-09-29 | Discharge: 2018-10-01 | Disposition: A | Discharge status: Home / Self Care | Payer: Medicare HMO | Attending: Internal Medicine | Admitting: Internal Medicine

## 2018-09-29 ENCOUNTER — Other Ambulatory Visit: Payer: Medicare HMO

## 2018-09-29 ENCOUNTER — Other Ambulatory Visit: Payer: Self-pay

## 2018-09-29 ENCOUNTER — Emergency Department: Payer: Medicare HMO

## 2018-09-29 DIAGNOSIS — R55 Syncope and collapse: Secondary | ICD-10-CM

## 2018-09-29 DIAGNOSIS — F319 Bipolar disorder, unspecified: Secondary | ICD-10-CM | POA: Diagnosis not present

## 2018-09-29 DIAGNOSIS — I5032 Chronic diastolic (congestive) heart failure: Secondary | ICD-10-CM | POA: Insufficient documentation

## 2018-09-29 DIAGNOSIS — F419 Anxiety disorder, unspecified: Secondary | ICD-10-CM | POA: Diagnosis not present

## 2018-09-29 DIAGNOSIS — G4730 Sleep apnea, unspecified: Secondary | ICD-10-CM | POA: Diagnosis not present

## 2018-09-29 DIAGNOSIS — E860 Dehydration: Secondary | ICD-10-CM | POA: Diagnosis not present

## 2018-09-29 DIAGNOSIS — Z87891 Personal history of nicotine dependence: Secondary | ICD-10-CM | POA: Insufficient documentation

## 2018-09-29 DIAGNOSIS — I252 Old myocardial infarction: Secondary | ICD-10-CM | POA: Insufficient documentation

## 2018-09-29 DIAGNOSIS — Z8673 Personal history of transient ischemic attack (TIA), and cerebral infarction without residual deficits: Secondary | ICD-10-CM | POA: Diagnosis not present

## 2018-09-29 DIAGNOSIS — K219 Gastro-esophageal reflux disease without esophagitis: Secondary | ICD-10-CM | POA: Insufficient documentation

## 2018-09-29 DIAGNOSIS — D649 Anemia, unspecified: Secondary | ICD-10-CM | POA: Diagnosis not present

## 2018-09-29 DIAGNOSIS — J449 Chronic obstructive pulmonary disease, unspecified: Secondary | ICD-10-CM | POA: Diagnosis not present

## 2018-09-29 DIAGNOSIS — E119 Type 2 diabetes mellitus without complications: Secondary | ICD-10-CM | POA: Diagnosis not present

## 2018-09-29 DIAGNOSIS — E079 Disorder of thyroid, unspecified: Secondary | ICD-10-CM | POA: Insufficient documentation

## 2018-09-29 DIAGNOSIS — E785 Hyperlipidemia, unspecified: Secondary | ICD-10-CM | POA: Diagnosis not present

## 2018-09-29 DIAGNOSIS — Z79890 Hormone replacement therapy (postmenopausal): Secondary | ICD-10-CM | POA: Insufficient documentation

## 2018-09-29 DIAGNOSIS — M5127 Other intervertebral disc displacement, lumbosacral region: Secondary | ICD-10-CM | POA: Insufficient documentation

## 2018-09-29 DIAGNOSIS — Z7982 Long term (current) use of aspirin: Secondary | ICD-10-CM | POA: Insufficient documentation

## 2018-09-29 DIAGNOSIS — Z20828 Contact with and (suspected) exposure to other viral communicable diseases: Secondary | ICD-10-CM | POA: Insufficient documentation

## 2018-09-29 DIAGNOSIS — Z7984 Long term (current) use of oral hypoglycemic drugs: Secondary | ICD-10-CM | POA: Insufficient documentation

## 2018-09-29 DIAGNOSIS — Z79899 Other long term (current) drug therapy: Secondary | ICD-10-CM | POA: Insufficient documentation

## 2018-09-29 DIAGNOSIS — R2681 Unsteadiness on feet: Secondary | ICD-10-CM | POA: Insufficient documentation

## 2018-09-29 DIAGNOSIS — I2510 Atherosclerotic heart disease of native coronary artery without angina pectoris: Secondary | ICD-10-CM | POA: Insufficient documentation

## 2018-09-29 DIAGNOSIS — I071 Rheumatic tricuspid insufficiency: Secondary | ICD-10-CM | POA: Insufficient documentation

## 2018-09-29 DIAGNOSIS — I130 Hypertensive heart and chronic kidney disease with heart failure and stage 1 through stage 4 chronic kidney disease, or unspecified chronic kidney disease: Secondary | ICD-10-CM | POA: Insufficient documentation

## 2018-09-29 DIAGNOSIS — R42 Dizziness and giddiness: Secondary | ICD-10-CM

## 2018-09-29 DIAGNOSIS — K432 Incisional hernia without obstruction or gangrene: Secondary | ICD-10-CM | POA: Insufficient documentation

## 2018-09-29 DIAGNOSIS — M48061 Spinal stenosis, lumbar region without neurogenic claudication: Secondary | ICD-10-CM | POA: Diagnosis not present

## 2018-09-29 DIAGNOSIS — F4310 Post-traumatic stress disorder, unspecified: Secondary | ICD-10-CM | POA: Diagnosis not present

## 2018-09-29 DIAGNOSIS — D3502 Benign neoplasm of left adrenal gland: Secondary | ICD-10-CM | POA: Insufficient documentation

## 2018-09-29 DIAGNOSIS — K859 Acute pancreatitis without necrosis or infection, unspecified: Secondary | ICD-10-CM | POA: Diagnosis not present

## 2018-09-29 DIAGNOSIS — J45909 Unspecified asthma, uncomplicated: Secondary | ICD-10-CM | POA: Diagnosis not present

## 2018-09-29 DIAGNOSIS — G9050 Complex regional pain syndrome I, unspecified: Secondary | ICD-10-CM | POA: Insufficient documentation

## 2018-09-29 DIAGNOSIS — Z791 Long term (current) use of non-steroidal anti-inflammatories (NSAID): Secondary | ICD-10-CM | POA: Insufficient documentation

## 2018-09-29 DIAGNOSIS — N183 Chronic kidney disease, stage 3 (moderate): Secondary | ICD-10-CM | POA: Insufficient documentation

## 2018-09-29 DIAGNOSIS — Z6839 Body mass index (BMI) 39.0-39.9, adult: Secondary | ICD-10-CM | POA: Insufficient documentation

## 2018-09-29 DIAGNOSIS — E039 Hypothyroidism, unspecified: Secondary | ICD-10-CM | POA: Insufficient documentation

## 2018-09-29 LAB — URINE DRUG SCREEN, QUALITATIVE (ARMC ONLY)
Amphetamines, Ur Screen: NOT DETECTED
Amphetamines, Ur Screen: NOT DETECTED
Barbiturates, Ur Screen: NOT DETECTED
Barbiturates, Ur Screen: NOT DETECTED
Benzodiazepine, Ur Scrn: NOT DETECTED
Benzodiazepine, Ur Scrn: NOT DETECTED
Cannabinoid 50 Ng, Ur ~~LOC~~: NOT DETECTED
Cannabinoid 50 Ng, Ur ~~LOC~~: NOT DETECTED
Cocaine Metabolite,Ur ~~LOC~~: NOT DETECTED
Cocaine Metabolite,Ur ~~LOC~~: NOT DETECTED
MDMA (Ecstasy)Ur Screen: NOT DETECTED
MDMA (Ecstasy)Ur Screen: NOT DETECTED
Methadone Scn, Ur: NOT DETECTED
Methadone Scn, Ur: NOT DETECTED
Opiate, Ur Screen: NOT DETECTED
Opiate, Ur Screen: NOT DETECTED
Phencyclidine (PCP) Ur S: NOT DETECTED
Phencyclidine (PCP) Ur S: NOT DETECTED
Tricyclic, Ur Screen: NOT DETECTED
Tricyclic, Ur Screen: NOT DETECTED

## 2018-09-29 LAB — COMPREHENSIVE METABOLIC PANEL
ALT: 14 U/L (ref 0–44)
AST: 18 U/L (ref 15–41)
Albumin: 3.7 g/dL (ref 3.5–5.0)
Alkaline Phosphatase: 86 U/L (ref 38–126)
Anion gap: 8 (ref 5–15)
BUN: 23 mg/dL — ABNORMAL HIGH (ref 6–20)
CO2: 27 mmol/L (ref 22–32)
Calcium: 9.1 mg/dL (ref 8.9–10.3)
Chloride: 106 mmol/L (ref 98–111)
Creatinine, Ser: 0.88 mg/dL (ref 0.44–1.00)
GFR calc Af Amer: >60 mL/min (ref >60)
GFR calc non Af Amer: >60 mL/min (ref >60)
Glucose, Bld: 109 mg/dL — ABNORMAL HIGH (ref 70–99)
Potassium: 4.2 mmol/L (ref 3.5–5.1)
Sodium: 141 mmol/L (ref 135–145)
Total Bilirubin: 0.5 mg/dL (ref 0.3–1.2)
Total Protein: 7.6 g/dL (ref 6.5–8.1)

## 2018-09-29 LAB — URINALYSIS, COMPLETE (UACMP) WITH MICROSCOPIC
Bacteria, UA: NONE SEEN
Bilirubin Urine: NEGATIVE
Glucose, UA: NEGATIVE mg/dL
Hgb urine dipstick: NEGATIVE
Ketones, ur: 5 mg/dL — AB
Leukocytes,Ua: NEGATIVE
Nitrite: NEGATIVE
Protein, ur: NEGATIVE mg/dL
Specific Gravity, Urine: 1.02 (ref 1.005–1.030)
WBC, UA: NONE SEEN WBC/hpf (ref 0–5)
pH: 7 (ref 5.0–8.0)

## 2018-09-29 LAB — PROTIME-INR
INR: 1 (ref 0.8–1.2)
Prothrombin Time: 13 seconds (ref 11.4–15.2)

## 2018-09-29 LAB — CBC WITH DIFFERENTIAL/PLATELET
Abs Immature Granulocytes: 0.02 10*3/uL (ref 0.00–0.07)
Basophils Absolute: 0 10*3/uL (ref 0.0–0.1)
Basophils Relative: 0 %
Eosinophils Absolute: 0 10*3/uL (ref 0.0–0.5)
Eosinophils Relative: 0 %
HCT: 37.7 % (ref 36.0–46.0)
Hemoglobin: 12.2 g/dL (ref 12.0–15.0)
Immature Granulocytes: 0 %
Lymphocytes Relative: 14 %
Lymphs Abs: 1.1 10*3/uL (ref 0.7–4.0)
MCH: 31.5 pg (ref 26.0–34.0)
MCHC: 32.4 g/dL (ref 30.0–36.0)
MCV: 97.4 fL (ref 80.0–100.0)
Monocytes Absolute: 0.4 10*3/uL (ref 0.1–1.0)
Monocytes Relative: 5 %
Neutro Abs: 6.4 10*3/uL (ref 1.7–7.7)
Neutrophils Relative %: 81 %
Platelets: 237 10*3/uL (ref 150–400)
RBC: 3.87 MIL/uL (ref 3.87–5.11)
RDW: 14 % (ref 11.5–15.5)
WBC: 8 10*3/uL (ref 4.0–10.5)
nRBC: 0 % (ref 0.0–0.2)

## 2018-09-29 LAB — SARS CORONAVIRUS 2 BY RT PCR (HOSPITAL ORDER, PERFORMED IN ~~LOC~~ HOSPITAL LAB): SARS Coronavirus 2: NEGATIVE

## 2018-09-29 LAB — VALPROIC ACID LEVEL: Valproic Acid Lvl: 33 ug/mL — ABNORMAL LOW (ref 50.0–100.0)

## 2018-09-29 LAB — LIPASE, BLOOD: Lipase: 29 U/L (ref 11–51)

## 2018-09-29 LAB — GLUCOSE, CAPILLARY
Glucose-Capillary: 84 mg/dL (ref 70–99)
Glucose-Capillary: 89 mg/dL (ref 70–99)

## 2018-09-29 LAB — ACETAMINOPHEN LEVEL: Acetaminophen (Tylenol), Serum: <10 ug/mL — ABNORMAL LOW (ref 10–30)

## 2018-09-29 LAB — HEMOGLOBIN A1C
Hgb A1c MFr Bld: 6.3 % — ABNORMAL HIGH (ref 4.8–5.6)
Mean Plasma Glucose: 134.11 mg/dL

## 2018-09-29 LAB — FIBRIN DERIVATIVES D-DIMER (ARMC ONLY): Fibrin derivatives D-dimer (ARMC): 413.86 ng/mL (FEU) (ref 0.00–499.00)

## 2018-09-29 LAB — LACTIC ACID, PLASMA: Lactic Acid, Venous: 2.1 mmol/L (ref 0.5–1.9)

## 2018-09-29 LAB — ETHANOL: Alcohol, Ethyl (B): <10 mg/dL (ref <10)

## 2018-09-29 LAB — SALICYLATE LEVEL: Salicylate Lvl: <7 mg/dL (ref 2.8–30.0)

## 2018-09-29 LAB — TROPONIN I (HIGH SENSITIVITY): Troponin I (High Sensitivity): 2 ng/L (ref <18)

## 2018-09-29 LAB — TSH: TSH: 3.335 u[IU]/mL (ref 0.350–4.500)

## 2018-09-29 MED ORDER — PANTOPRAZOLE SODIUM 40 MG PO TBEC
40.0000 mg | DELAYED_RELEASE_TABLET | Freq: Every day | ORAL | Status: DC
  Administered 2018-09-29 – 2018-10-01 (×3): 40 mg via ORAL
  Filled 2018-09-29 (×3): qty 1

## 2018-09-29 MED ORDER — BUSPIRONE HCL 5 MG PO TABS
20.0000 mg | ORAL_TABLET | Freq: Three times a day (TID) | ORAL | Status: DC
  Administered 2018-09-29 – 2018-10-01 (×6): 20 mg via ORAL
  Filled 2018-09-29 (×9): qty 4

## 2018-09-29 MED ORDER — MELOXICAM 7.5 MG PO TABS: 15.0000 mg | ORAL_TABLET | Freq: Every day | ORAL | Status: DC

## 2018-09-29 MED ORDER — IOHEXOL 300 MG/ML  SOLN
100.0000 mL | Freq: Once | INTRAMUSCULAR | Status: AC | PRN
  Administered 2018-09-29: 100 mL via INTRAVENOUS

## 2018-09-29 MED ORDER — ALBUTEROL SULFATE (2.5 MG/3ML) 0.083% IN NEBU: 2.5000 mg | INHALATION_SOLUTION | Freq: Four times a day (QID) | RESPIRATORY_TRACT | Status: DC | PRN

## 2018-09-29 MED ORDER — DULAGLUTIDE 0.75 MG/0.5ML ~~LOC~~ SOAJ: 0.7500 mg | SUBCUTANEOUS | Status: DC

## 2018-09-29 MED ORDER — ONDANSETRON HCL 4 MG/2ML IJ SOLN
4.0000 mg | Freq: Once | INTRAMUSCULAR | Status: AC
  Administered 2018-09-29: 4 mg via INTRAVENOUS
  Filled 2018-09-29: qty 2

## 2018-09-29 MED ORDER — PREGABALIN 75 MG PO CAPS
75.0000 mg | ORAL_CAPSULE | Freq: Two times a day (BID) | ORAL | Status: DC
  Administered 2018-09-29 – 2018-10-01 (×4): 75 mg via ORAL
  Filled 2018-09-29 (×4): qty 1

## 2018-09-29 MED ORDER — LEVOTHYROXINE SODIUM 50 MCG PO TABS
25.0000 ug | ORAL_TABLET | Freq: Every day | ORAL | Status: DC
  Administered 2018-09-30 – 2018-10-01 (×2): 25 ug via ORAL
  Filled 2018-09-29 (×2): qty 1

## 2018-09-29 MED ORDER — LEVOTHYROXINE SODIUM 200 MCG PO TABS
200.0000 ug | ORAL_TABLET | Freq: Every day | ORAL | Status: DC
  Administered 2018-09-30 – 2018-10-01 (×2): 200 ug via ORAL
  Filled 2018-09-29: qty 2
  Filled 2018-09-29: qty 1
  Filled 2018-09-29: qty 2
  Filled 2018-09-29: qty 1

## 2018-09-29 MED ORDER — TORSEMIDE 20 MG PO TABS
40.0000 mg | ORAL_TABLET | Freq: Two times a day (BID) | ORAL | Status: DC
  Administered 2018-09-30 – 2018-10-01 (×3): 40 mg via ORAL
  Filled 2018-09-29 (×4): qty 2

## 2018-09-29 MED ORDER — QUETIAPINE FUMARATE 25 MG PO TABS
100.0000 mg | ORAL_TABLET | Freq: Every day | ORAL | Status: DC
  Administered 2018-09-29 – 2018-09-30 (×2): 100 mg via ORAL
  Filled 2018-09-29 (×2): qty 4

## 2018-09-29 MED ORDER — NITROGLYCERIN 0.4 MG SL SUBL: 0.4000 mg | SUBLINGUAL_TABLET | SUBLINGUAL | Status: DC | PRN

## 2018-09-29 MED ORDER — ACETAMINOPHEN 325 MG PO TABS
325.0000 mg | ORAL_TABLET | Freq: Four times a day (QID) | ORAL | Status: DC | PRN
  Administered 2018-09-30: 325 mg via ORAL
  Filled 2018-09-29: qty 1

## 2018-09-29 MED ORDER — MIRABEGRON ER 50 MG PO TB24
50.0000 mg | ORAL_TABLET | Freq: Every day | ORAL | Status: DC
  Administered 2018-09-29 – 2018-10-01 (×3): 50 mg via ORAL
  Filled 2018-09-29 (×4): qty 1

## 2018-09-29 MED ORDER — HALOPERIDOL LACTATE 5 MG/ML IJ SOLN
5.0000 mg | Freq: Once | INTRAMUSCULAR | Status: AC
  Administered 2018-09-29: 5 mg via INTRAVENOUS
  Filled 2018-09-29: qty 1

## 2018-09-29 MED ORDER — ORAL CARE MOUTH RINSE
15.0000 mL | Freq: Two times a day (BID) | OROMUCOSAL | Status: DC
  Administered 2018-09-29 – 2018-09-30 (×2): 15 mL via OROMUCOSAL

## 2018-09-29 MED ORDER — NAPROXEN 500 MG PO TABS
500.0000 mg | ORAL_TABLET | Freq: Two times a day (BID) | ORAL | Status: DC
  Administered 2018-09-30: 500 mg via ORAL
  Filled 2018-09-29 (×3): qty 1

## 2018-09-29 MED ORDER — SODIUM CHLORIDE 0.9 % IV SOLN
INTRAVENOUS | Status: DC
  Administered 2018-09-29: 19:00:00 via INTRAVENOUS

## 2018-09-29 MED ORDER — ASPIRIN EC 81 MG PO TBEC
81.0000 mg | DELAYED_RELEASE_TABLET | Freq: Every day | ORAL | Status: DC
  Administered 2018-09-30: 81 mg via ORAL
  Filled 2018-09-29 (×2): qty 1

## 2018-09-29 MED ORDER — METHOCARBAMOL 750 MG PO TABS
750.0000 mg | ORAL_TABLET | Freq: Three times a day (TID) | ORAL | Status: DC | PRN
  Administered 2018-09-29 – 2018-09-30 (×2): 750 mg via ORAL
  Filled 2018-09-29 (×3): qty 1

## 2018-09-29 MED ORDER — PROPRANOLOL HCL 20 MG PO TABS
20.0000 mg | ORAL_TABLET | Freq: Three times a day (TID) | ORAL | Status: DC | PRN
  Filled 2018-09-29: qty 1

## 2018-09-29 MED ORDER — SODIUM CHLORIDE 0.9 % IV BOLUS
1000.0000 mL | Freq: Once | INTRAVENOUS | Status: AC
  Administered 2018-09-29: 1000 mL via INTRAVENOUS

## 2018-09-29 MED ORDER — IBUPROFEN 800 MG PO TABS
800.0000 mg | ORAL_TABLET | Freq: Three times a day (TID) | ORAL | Status: DC | PRN
  Administered 2018-09-29 – 2018-09-30 (×3): 800 mg via ORAL
  Filled 2018-09-29 (×3): qty 1
  Filled 2018-09-29 (×2): qty 2
  Filled 2018-09-29: qty 1

## 2018-09-29 MED ORDER — INSULIN ASPART 100 UNIT/ML ~~LOC~~ SOLN
0.0000 [IU] | Freq: Every day | SUBCUTANEOUS | Status: DC
  Filled 2018-09-29: qty 1

## 2018-09-29 MED ORDER — ENOXAPARIN SODIUM 40 MG/0.4ML ~~LOC~~ SOLN
40.0000 mg | SUBCUTANEOUS | Status: DC
  Filled 2018-09-29: qty 0.4

## 2018-09-29 MED ORDER — DIVALPROEX SODIUM 250 MG PO DR TAB
250.0000 mg | DELAYED_RELEASE_TABLET | Freq: Three times a day (TID) | ORAL | Status: DC
  Administered 2018-09-29 – 2018-09-30 (×4): 250 mg via ORAL
  Filled 2018-09-29 (×9): qty 1

## 2018-09-29 MED ORDER — CETIRIZINE HCL 10 MG PO TABS
10.0000 mg | ORAL_TABLET | Freq: Every day | ORAL | Status: DC
  Administered 2018-09-29 – 2018-10-01 (×3): 10 mg via ORAL
  Filled 2018-09-29 (×3): qty 1

## 2018-09-29 MED ORDER — MONTELUKAST SODIUM 10 MG PO TABS
10.0000 mg | ORAL_TABLET | Freq: Every day | ORAL | Status: DC
  Administered 2018-09-29 – 2018-10-01 (×3): 10 mg via ORAL
  Filled 2018-09-29 (×3): qty 1

## 2018-09-29 MED ORDER — AMITRIPTYLINE HCL 50 MG PO TABS
25.0000 mg | ORAL_TABLET | Freq: Every day | ORAL | Status: DC
  Administered 2018-09-29 – 2018-09-30 (×2): 25 mg via ORAL
  Filled 2018-09-29: qty 1
  Filled 2018-09-29 (×3): qty 0.5
  Filled 2018-09-29: qty 1

## 2018-09-29 MED ORDER — ATORVASTATIN CALCIUM 20 MG PO TABS
40.0000 mg | ORAL_TABLET | Freq: Every day | ORAL | Status: DC
  Administered 2018-09-29 – 2018-09-30 (×2): 40 mg via ORAL
  Filled 2018-09-29 (×2): qty 2

## 2018-09-29 MED ORDER — DIPHENHYDRAMINE HCL 25 MG PO CAPS: 25.0000 mg | ORAL_CAPSULE | Freq: Two times a day (BID) | ORAL | Status: DC | PRN

## 2018-09-29 MED ORDER — INSULIN ASPART 100 UNIT/ML ~~LOC~~ SOLN
0.0000 [IU] | Freq: Three times a day (TID) | SUBCUTANEOUS | Status: DC
  Administered 2018-09-30: 1 [IU] via SUBCUTANEOUS
  Filled 2018-09-29 (×2): qty 1

## 2018-09-29 MED ORDER — MAGNESIUM OXIDE 400 (241.3 MG) MG PO TABS
400.0000 mg | ORAL_TABLET | Freq: Two times a day (BID) | ORAL | Status: DC
  Administered 2018-09-29 – 2018-09-30 (×3): 400 mg via ORAL
  Filled 2018-09-29 (×3): qty 1

## 2018-09-29 NOTE — ED Notes (Signed)
.. ED TO INPATIENT HANDOFF REPORT  ED Nurse Name and Phone #: Deneise Lever [phone]  S Name/Age/Gender Marie Clarke 48 y.o. female Room/Bed: ED18A/ED18A  Code Status   Code Status: Full Code  Home/SNF/Other Home Patient oriented to: self, place, time and situation Is this baseline? Yes   Triage Complete: Triage complete  Chief Complaint N/V via EMS   Triage Note Pt states she woke up around 1am with N/V/D,not talking normal, off balance , unable to ambulate without assistance. Pt is covered in emesis on arrival lives with her aunt who states she is not her normal self on arrival.    Allergies Allergies  Allergen Reactions  . Diazepam Hives and Nausea And Vomiting  . Levofloxacin Hives and Rash    1/31: would like to retry since not taking valium  . Metronidazole Hives    1/31: would like to retry since she is no longer taking valium  . Pregabalin Hives and Rash    1/31 currently taking lyrica without reaction since no longer taking valium  . Sulfa Antibiotics Hives  . Cephalexin Hives  . Ciprofloxacin Hives  . Ziprasidone Hcl Other (See Comments)    CONFUSED CONFUSED Other reaction(s): Other (See Comments) CONFUSED CONFUSED   . Penicillins Nausea And Vomiting    Has patient had a PCN reaction causing immediate rash, facial/tongue/throat swelling, SOB or lightheadedness with hypotension: No Has patient had a PCN reaction causing severe rash involving mucus membranes or skin necrosis: No Has patient had a PCN reaction that required hospitalization: No Has patient had a PCN reaction occurring within the last 10 years: No If all of the above answers are "NO", then may proceed with Cephalosporin use.     Level of Care/Admitting Diagnosis ED Disposition    ED Disposition Condition [facility] Area: [place] [[id number]]  Level of Care: Med-Surg [16]  Covid Evaluation: Asymptomatic Screening Protocol (No Symptoms)  Diagnosis: Postural  dizziness with presyncope [[id number]]  Admitting Physician: Lang Snow [[id number]]  Attending Physician: Rufina Falco ACHIENG [[id number]]  PT Class (Do Not Modify): Observation [104]  PT Acc Code (Do Not Modify): Observation [10022]       B Medical/Surgery History Past Medical History:  Diagnosis Date  . Anemia   . Anxiety   . Asthma   . Bipolar disorder ([place])   . CAD (coronary artery disease) unk  . CHF (congestive heart failure) ([place])   . COPD (chronic obstructive pulmonary disease) ([place])   . Depression unk  . Diabetes mellitus without complication ([place])   . Diabetes mellitus, type II ([place])   . Drug overdose   . GERD (gastroesophageal reflux disease)   . Headache   . Hyperlipidemia   . Hypertension   . Left leg pain 04/29/2014  . MI (myocardial infarction) ([place])   . Muscle ache 09/16/2014  . Osteoporosis   . Overactive bladder   . Pancreatitis unk  . Reflex sympathetic dystrophy   . Renal insufficiency   . Sleep apnea    pt reported on 2/6/7 she currently is not using CPAP  . Sleep apnea   . Stroke ([place])   . Thyroid disease   . TIA (transient ischemic attack) unk  . TIA (transient ischemic attack)    Past Surgical History:  Procedure Laterality Date  . ABDOMINAL HYSTERECTOMY    . HERNIA REPAIR  07/15/2017  . prolapse rectum surgery N/A July 2016  . TONSILLECTOMY       A IV  Location/Drains/Wounds Patient Lines/Drains/Airways Status   Active Line/Drains/Airways    Name:   Placement date:   Placement time:   Site:   Days:   Peripheral IV 09/29/18 Right Antecubital   09/29/18    0827    Antecubital   less than 1          Intake/Output Last 24 hours No intake or output data in the 24 hours ending 09/29/18 1512  Labs/Imaging Results for orders placed or performed during the hospital encounter of 09/29/18 (from the past 48 hour(s))  Comprehensive metabolic panel     Status: Abnormal   Collection Time: 09/29/18  8:26 AM  Result Value Ref Range    Sodium 141 135 - 145 mmol/L   Potassium 4.2 3.5 - 5.1 mmol/L   Chloride 106 98 - 111 mmol/L   CO2 27 22 - 32 mmol/L   Glucose, Bld 109 (H) 70 - 99 mg/dL   BUN 23 (H) 6 - 20 mg/dL   Creatinine, Ser 0.88 0.44 - 1.00 mg/dL   Calcium 9.1 8.9 - 10.3 mg/dL   Total Protein 7.6 6.5 - 8.1 g/dL   Albumin 3.7 3.5 - 5.0 g/dL   AST 18 15 - 41 U/L   ALT 14 0 - 44 U/L   Alkaline Phosphatase 86 38 - 126 U/L   Total Bilirubin 0.5 0.3 - 1.2 mg/dL   GFR calc non Af Amer >60 >60 mL/min   GFR calc Af Amer >60 >60 mL/min   Anion gap 8 5 - 15    Comment: Performed at [facility], [address] [place], [place] [address]  Ethanol     Status: None   Collection Time: 09/29/18  8:26 AM  Result Value Ref Range   Alcohol, Ethyl (B) <10 <10 mg/dL    Comment: (NOTE) Lowest detectable limit for serum alcohol is 10 mg/dL. For medical purposes only. Performed at [facility], [address] [place], [place] [address]   Acetaminophen level     Status: Abnormal   Collection Time: 09/29/18  8:26 AM  Result Value Ref Range   Acetaminophen (Tylenol), Serum <10 (L) 10 - 30 ug/mL    Comment: (NOTE) Therapeutic concentrations vary significantly. A range of 10-30 ug/mL  may be an effective concentration for many patients. However, some  are best treated at concentrations outside of this range. Acetaminophen concentrations >150 ug/mL at 4 hours after ingestion  and >50 ug/mL at 12 hours after ingestion are often associated with  toxic reactions. Performed at [facility], [address] [place], [place] [address]   Salicylate level     Status: None   Collection Time: 09/29/18  8:26 AM  Result Value Ref Range   Salicylate Lvl <3.3 2.8 - 30.0 mg/dL    Comment: Performed at [facility], [place], [place] [address]  Lactic acid, plasma     Status: Abnormal   Collection Time: 09/29/18  8:26 AM  Result Value Ref Range   Lactic Acid, Venous 2.1 (HH) 0.5 -  1.9 mmol/L    Comment: CRITICAL RESULT CALLED TO, READ BACK BY AND VERIFIED WITH VALERIE CHANDLER @0900  09/29/18 AKT Performed at [facility], [address] [place], [place] [address]   CBC with Differential     Status: None   Collection Time: 09/29/18  8:26 AM  Result Value Ref Range   WBC 8.0 4.0 - 10.5 K/uL   RBC 3.87 3.87 - 5.11 MIL/uL  Hemoglobin 12.2 12.0 - 15.0 g/dL   HCT 37.7 36.0 - 46.0 %   MCV 97.4 80.0 - 100.0 fL   MCH 31.5 26.0 - 34.0 pg   MCHC 32.4 30.0 - 36.0 g/dL   RDW 14.0 11.5 - 15.5 %   Platelets 237 150 - 400 K/uL   nRBC 0.0 0.0 - 0.2 %   Neutrophils Relative % 81 %   Neutro Abs 6.4 1.7 - 7.7 K/uL   Lymphocytes Relative 14 %   Lymphs Abs 1.1 0.7 - 4.0 K/uL   Monocytes Relative 5 %   Monocytes Absolute 0.4 0.1 - 1.0 K/uL   Eosinophils Relative 0 %   Eosinophils Absolute 0.0 0.0 - 0.5 K/uL   Basophils Relative 0 %   Basophils Absolute 0.0 0.0 - 0.1 K/uL   Immature Granulocytes 0 %   Abs Immature Granulocytes 0.02 0.00 - 0.07 K/uL    Comment: Performed at [facility], [address] [place], [place] [address]  Valproic acid level     Status: Abnormal   Collection Time: 09/29/18  8:26 AM  Result Value Ref Range   Valproic Acid Lvl 33 (L) 50.0 - 100.0 ug/mL    Comment: Performed at [facility], [address] [place], [place] [address]  Lipase, blood     Status: None   Collection Time: 09/29/18  8:26 AM  Result Value Ref Range   Lipase 29 11 - 51 U/L    Comment: Performed at [facility], [place], [place] [address]  Troponin I (High Sensitivity)     Status: None   Collection Time: 09/29/18  8:26 AM  Result Value Ref Range   Troponin I (High Sensitivity) 2 <18 ng/L    Comment: (NOTE) Elevated high sensitivity troponin I (hsTnI) values and significant  changes across serial measurements may suggest ACS but many other  chronic and acute conditions are known to elevate hsTnI results.  Refer to the  "Links" section for chest pain algorithms and additional  guidance. Performed at [facility], [address] [place], [place] [address]   Fibrin derivatives D-Dimer     Status: None   Collection Time: 09/29/18  8:26 AM  Result Value Ref Range   Fibrin derivatives D-dimer ([facility]) 413.86 0.00 - 499.00 ng/mL (FEU)    Comment: (NOTE) <> Exclusion of Venous Thromboembolism (VTE) - OUTPATIENT ONLY   (Emergency Department or [place])   0-499 ng/ml (FEU): With a low to intermediate pretest probability                      for VTE this test result excludes the diagnosis                      of VTE.   >499 ng/ml (FEU) : VTE not excluded; additional work up for VTE is                      required. <> Testing on Inpatients and Evaluation of Disseminated Intravascular   Coagulation (DIC) Reference Range:   0-499 ng/ml (FEU) Performed at [facility], [address] [place], [place] [address]   Protime-INR     Status: None   Collection Time: 09/29/18  8:26 AM  Result Value Ref Range   Prothrombin Time 13.0 11.4 - 15.2 seconds   INR 1.0 0.8 - 1.2    Comment: (NOTE) INR goal varies based on device  and disease states. Performed at [facility], [address] [place], [place] [address]   Urinalysis, Complete w Microscopic     Status: Abnormal   Collection Time: 09/29/18  9:30 AM  Result Value Ref Range   Color, Urine YELLOW (A) YELLOW   APPearance CLEAR (A) CLEAR   Specific Gravity, Urine 1.020 1.005 - 1.030   pH 7.0 5.0 - 8.0   Glucose, UA NEGATIVE NEGATIVE mg/dL   Hgb urine dipstick NEGATIVE NEGATIVE   Bilirubin Urine NEGATIVE NEGATIVE   Ketones, ur 5 (A) NEGATIVE mg/dL   Protein, ur NEGATIVE NEGATIVE mg/dL   Nitrite NEGATIVE NEGATIVE   Leukocytes,Ua NEGATIVE NEGATIVE   WBC, UA NONE SEEN 0 - 5 WBC/hpf   Bacteria, UA NONE SEEN NONE SEEN   Squamous Epithelial / LPF 0-5 0 - 5   Mucus PRESENT     Comment: Performed at [facility], [address], [place], [place] [address]  Urine Drug Screen, Qualitative     Status: None   Collection Time: 09/29/18  9:30 AM  Result Value Ref Range   Tricyclic, Ur Screen NONE DETECTED NONE DETECTED   Amphetamines, Ur Screen NONE DETECTED NONE DETECTED   MDMA (Ecstasy)Ur Screen NONE DETECTED NONE DETECTED   Cocaine Metabolite,Ur [place] NONE DETECTED NONE DETECTED   Opiate, Ur Screen NONE DETECTED NONE DETECTED   Phencyclidine (PCP) Ur S NONE DETECTED NONE DETECTED   Cannabinoid 50 Ng, Ur [place] NONE DETECTED NONE DETECTED   Barbiturates, Ur Screen NONE DETECTED NONE DETECTED   Benzodiazepine, Ur Scrn NONE DETECTED NONE DETECTED   Methadone Scn, Ur NONE DETECTED NONE DETECTED    Comment: (NOTE) Tricyclics + metabolites, urine    Cutoff 1000 ng/mL Amphetamines + metabolites, urine  Cutoff 1000 ng/mL MDMA (Ecstasy), urine              Cutoff 500 ng/mL Cocaine Metabolite, urine          Cutoff 300 ng/mL Opiate + metabolites, urine        Cutoff 300 ng/mL Phencyclidine (PCP), urine         Cutoff 25 ng/mL Cannabinoid, urine                 Cutoff 50 ng/mL Barbiturates + metabolites, urine  Cutoff 200 ng/mL Benzodiazepine, urine              Cutoff 200 ng/mL Methadone, urine                   Cutoff 300 ng/mL The urine drug screen provides only a preliminary, unconfirmed analytical test result and should not be used for non-medical purposes. Clinical consideration and professional judgment should be applied to any positive drug screen result due to possible interfering substances. A more specific alternate chemical method must be used in order to obtain a confirmed analytical result. Gas chromatography / mass spectrometry (GC/MS) is the preferred confirmat ory method. Performed at [facility], [place], [place] [address]    Ct Head Wo Contrast  Result Date: 09/29/2018 CLINICAL DATA:  Altered level of consciousness EXAM: CT HEAD WITHOUT CONTRAST TECHNIQUE: Contiguous axial  images were obtained from the base of the skull through the vertex without intravenous contrast. COMPARISON:  Head CT and brain MRI September 20, 2016 FINDINGS: Brain: The ventricles are normal in size and configuration. There is no intracranial mass, hemorrhage, extra-axial fluid collection, or midline shift. The brain parenchyma appears unremarkable. No evident acute infarct. Vascular:  There is no hyperdense vessel. There is no appreciable vascular calcification. Skull: Bony calvarium appears intact. Sinuses/Orbits: There is slight mucosal thickening in the inferior right maxillary antrum. There is slight mucosal thickening in several ethmoid air cells. Other visualized paranasal sinuses are clear. Orbits appear symmetric bilaterally. Other: Mastoid air cells are clear. IMPRESSION: Slight paranasal sinus disease.  Study otherwise unremarkable. Electronically Signed   By: Lowella Grip III M.D.   On: 09/29/2018 09:42   Ct Abdomen Pelvis W Contrast  Result Date: 09/29/2018 CLINICAL DATA:  Acute onset abdominal pain, nausea and vomiting, and diarrhea last night. EXAM: CT ABDOMEN AND PELVIS WITH CONTRAST TECHNIQUE: Multidetector CT imaging of the abdomen and pelvis was performed using the standard protocol following bolus administration of intravenous contrast. CONTRAST:  170mL OMNIPAQUE IOHEXOL 300 MG/ML  SOLN COMPARISON:  09/17/2018 and 09/06/2017 FINDINGS: Lower Chest: No acute findings. Hepatobiliary: No hepatic masses identified. Gallbladder is unremarkable. No evidence of biliary ductal dilatation. Pancreas:  No mass or inflammatory changes. Spleen: Within normal limits in size and appearance. Adrenals/Urinary Tract: Stable 2 cm left adrenal mass, consistent with benign adenoma. No evidence of renal masses or hydronephrosis. Unremarkable unopacified urinary bladder. Stomach/Bowel: No evidence of obstruction, inflammatory process or abnormal fluid collections. Anastomotic staples noted in the sigmoid colon.  Normal appendix visualized. Vascular/Lymphatic: No pathologically enlarged lymph nodes. No abdominal aortic aneurysm. Reproductive: Prior hysterectomy noted. Adnexal regions are unremarkable in appearance. Other: Stable tiny ventral abdominal wall incisional hernia containing a collapsed loop of small bowel. Musculoskeletal:  No suspicious bone lesions identified. IMPRESSION: 1. No acute findings within the abdomen or pelvis. 2. Stable tiny ventral abdominal wall incisional hernia. 3. Stable small benign left adrenal adenoma. Electronically Signed   By: Marlaine Hind M.D.   On: 09/29/2018 09:59    Pending Labs Unresulted Labs (From admission, onward)    Start     Ordered   09/30/18 5277  Basic metabolic panel  Tomorrow morning,   STAT     09/29/18 1511   09/29/18 1510  HIV antibody (Routine Testing)  Once,   STAT     09/29/18 1511   09/29/18 1406  SARS Coronavirus 2 [facility] order, Performed in [facility] hospital lab) Nasopharyngeal Nasopharyngeal Swab  (Symptomatic/High Risk of Exposure/Tier 1 Patients Labs with Precautions)  ONCE - STAT,   STAT    Question Answer Comment  Is this test for diagnosis or screening Diagnosis of ill patient   Symptomatic for COVID-19 as defined by CDC Yes   Date of Symptom Onset 09/28/2018   Hospitalized for COVID-19 No   Admitted to ICU for COVID-19 No   Previously tested for COVID-19 No   Resident in a congregate (group) care setting No   Employed in healthcare setting No   Pregnant No      09/29/18 1405          Vitals/Pain Today's Vitals   09/29/18 1300 09/29/18 1330 09/29/18 1400 09/29/18 1500  BP: 122/86 118/73 125/79   Pulse: (!) 108 (!) 112 (!) 117 (!) 110  Resp: 14 18 (!) 8 11  Temp:      TempSrc:      SpO2: 97% 93% 99% 99%  Weight:      Height:      PainSc:        Isolation Precautions Airborne and Contact precautions  Medications Medications  enoxaparin (LOVENOX) injection 40 mg (has no administration in time range)  0.9 %   sodium chloride infusion (has no  administration in time range)  sodium chloride 0.9 % bolus 1,000 mL (0 mLs Intravenous Stopped 09/29/18 0941)  ondansetron (ZOFRAN) injection 4 mg (4 mg Intravenous Given 09/29/18 0840)  haloperidol lactate (HALDOL) injection 5 mg (5 mg Intravenous Given 09/29/18 0903)  iohexol (OMNIPAQUE) 300 MG/ML solution 100 mL (100 mLs Intravenous Contrast Given 09/29/18 0914)  sodium chloride 0.9 % bolus 1,000 mL (1,000 mLs Intravenous New Bag/Given 09/29/18 1358)  sodium chloride 0.9 % bolus 1,000 mL (1,000 mLs Intravenous New Bag/Given 09/29/18 1419)    Mobility walks with person assist High fall risk   Focused Assessments Neuro Assessment Handoff:     NIH Stroke Scale ( + Modified Stroke Scale Criteria)  Interval: Initial Level of Consciousness (1a.)   : Alert, keenly responsive LOC Questions (1b. )   +: Answers both questions correctly LOC Commands (1c. )   + : Performs both tasks correctly Best Gaze (2. )  +: Normal Visual (3. )  +: No visual loss Facial Palsy (4. )    : Normal symmetrical movements Motor Arm, Left (5a. )   +: No drift Motor Arm, Right (5b. )   +: No drift Motor Leg, Right (6b. )   +: No drift Limb Ataxia (7. ): Present in one limb Sensory (8. )   +: Normal, no sensory loss Best Language (9. )   +: No aphasia Dysarthria (10. ): Mild-to-moderate dysarthria, patient slurs at least some words and, at worst, can be understood with some difficulty Extinction/Inattention (11.)   +: No Abnormality Last date known well: 09/28/18 Last time known well: 2000 Neuro Assessment: Exceptions to WDL(pt able to move in bed with good strength unassisted to clean up) Neuro Checks:   Initial (09/29/18 0841)  Last Documented NIHSS Modified Score:   Has TPA been given? No If patient is a Neuro Trauma and patient is going to OR before floor call report to [place] nurse: [phone] or [phone]     R Recommendations: See Admitting Provider  Note  Report given to:   Additional Notes:

## 2018-09-29 NOTE — ED Notes (Signed)
Very unsteady ambulating. Pt dizzy when getting up.

## 2018-09-29 NOTE — ED Notes (Signed)
Pt in EEG.

## 2018-09-29 NOTE — ED Provider Notes (Signed)
[facility] Emergency Department Provider Note  ____________________________________________  Time seen: Approximately 8:35 AM  I have reviewed the triage vital signs and the nursing notes.   HISTORY  Chief Complaint Altered Mental Status    HPI Marie Clarke is a 48 y.o. female with a history of bipolar disorder, CAD CHF COPD diabetes hypertension drug overdose pancreatitis who comes the ED complaining of periumbilical pain that is been going on for a few weeks as well as nausea vomiting and diarrhea that started last night.  She reports multiple episodes of emesis and diarrhea, and this morning at 6:00 AM when she got up she apparently had a loss of consciousness and fall to the floor.  She does not remember the fall, she was found on the floor surrounded by her emesis and stool.  Currently denies headache or vision changes.  Reports taking her medications as prescribed without skipping doses or taking extra, including her tramadol prescribed by pain management.  Denies chest pain or shortness of breath.  No extremity paresthesia or weakness.  She does note that she is wobbly and unsteady on her feet when she stands up.  She arrives extensively contaminated with stool and incontinent.     Past Medical History:  Diagnosis Date  . Anemia   . Anxiety   . Asthma   . Bipolar disorder ([place])   . CAD (coronary artery disease) unk  . CHF (congestive heart failure) ([place])   . COPD (chronic obstructive pulmonary disease) ([place])   . Depression unk  . Diabetes mellitus without complication ([place])   . Diabetes mellitus, type II ([place])   . Drug overdose   . GERD (gastroesophageal reflux disease)   . Headache   . Hyperlipidemia   . Hypertension   . Left leg pain 04/29/2014  . MI (myocardial infarction) ([place])   . Muscle ache 09/16/2014  . Osteoporosis   . Overactive bladder   . Pancreatitis unk  . Reflex sympathetic dystrophy   . Renal insufficiency   . Sleep apnea     pt reported on 2/6/7 she currently is not using CPAP  . Sleep apnea   . Stroke ([place])   . Thyroid disease   . TIA (transient ischemic attack) unk  . TIA (transient ischemic attack)      Patient Active Problem List   Diagnosis Date Noted  . Bipolar I disorder, most recent episode mixed ([place]) 09/10/2018  . Panic attacks 09/10/2018  . Chronic lumbosacral L5-S1 IVD protrusion (Bilateral) 08/25/2018  . Lumbar lateral recess stenosis (L5-S1) (Bilateral) 08/25/2018  . Wound disruption 08/05/2018  . Osteoarthritis involving multiple joints 07/10/2018  . Long term current use of non-steroidal anti-inflammatories (NSAID) 07/10/2018  . NSAID induced gastritis 07/10/2018  . DDD (degenerative disc disease), lumbosacral 04/22/2018  . Disease related peripheral neuropathy 11/13/2017  . Gastroesophageal reflux disease without esophagitis 11/13/2017  . Occipital headache (Bilateral) 11/13/2017  . Cervicogenic headache (Bilateral) (L>R) 11/13/2017  . History of postoperative nausea 10/22/2017  . Chronic shoulder pain (Fifth Area of Pain) (Bilateral) (L>R) 10/09/2017  . CRPS (complex regional pain syndrome) type 1 of lower limb (left ankle) 10/09/2017  . Ankle joint instability (Left) 10/09/2017  . Ankle sprain, sequela (Left) 10/09/2017  . History of psychiatric symptoms 10/09/2017  . Chronic pain syndrome 10/02/2017  . Spondylosis without myelopathy or radiculopathy, lumbosacral region 10/02/2017  . Chronic musculoskeletal pain 10/02/2017  . Elevated C-reactive protein (CRP) 09/10/2017  . Elevated sed rate 09/10/2017  . Chronic neck pain (Fourth  Area of Pain) (Bilateral) (L>R) 09/09/2017  . Pharmacologic therapy 09/09/2017  . Disorder of skeletal system 09/09/2017  . Problems influencing health status 09/09/2017  . Long term current use of opiate analgesic 09/09/2017  . Tobacco use disorder 07/16/2017  . Ventral hernia without obstruction or gangrene 06/25/2017  . Strain of extensor muscle,  fascia and tendon of left index finger at wrist and hand level, initial encounter 05/16/2017  . Sepsis ([place]) 04/14/2017  . Osteopenia 04/03/2017  . Hypotension 09/17/2016  . Contusion of knee (Left) 10/12/2015  . Strain of knee (Left) 10/12/2015  . Incidental lung nodule 04/28/2015  . Chronic pain 03/28/2015  . Chronic low back pain (Primary Area of Pain) (Bilateral) (L>R) 03/28/2015  . Chronic lower extremity pain (Referred) (Secondary Area of Pain) (Left) 03/28/2015  . Abdominal wound dehiscence 03/28/2015  . Encounter for pain management planning 03/28/2015  . Morbid obesity ([place]) 03/28/2015  . Abnormal CT scan, lumbar spine 03/28/2015  . Lumbar facet hypertrophy 03/28/2015  . Lumbar facet syndrome (Bilateral) (L>R) 03/28/2015  . Lumbar foraminal stenosis (Bilateral) (L5-S1) 03/28/2015  . Chronic ankle pain [facility] Area of Pain) (Left) 03/28/2015  . Neurogenic pain 03/28/2015  . Neuropathic pain 03/28/2015  . Myofascial pain 03/28/2015  . History of suicide attempt 03/28/2015  . PTSD (post-traumatic stress disorder) 01/13/2015  . Abnormal gait 12/15/2014  . Congestive heart failure ([place]) 11/15/2014  . Abdominal wall abscess 09/20/2014  . 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 Diabetes mellitus, type 2 ([place]) 08/13/2014  . Bipolar affective disorder ([place]) 08/13/2014  . Type 2 diabetes mellitus ([place]) 08/13/2014  . Rectal prolapse 08/09/2014  . Rectal bleeding 08/09/2014  . Rectal bleed 08/09/2014  . Affective bipolar disorder ([place]) 08/05/2014  . Arteriosclerosis of coronary artery 08/05/2014  . CCF (congestive cardiac failure) ([place]) 08/05/2014  . Chronic kidney disease 08/05/2014  . 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 Apnea, sleep 08/05/2014  . Temporary cerebral vascular dysfunction 08/05/2014  . Polypharmacy 04/29/2014  . Other long term (current) drug therapy 04/29/2014  . 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 Chronic kidney disease, stage III (moderate) ([place])  12/14/2013  . Controlled diabetes mellitus type II without complication ([place]) 42/70/6237  . Essential (primary) hypertension 12/03/2013  . Adult hypothyroidism 12/03/2013  . Controlled type 2 diabetes mellitus without complication ([place]) 62/83/1517     Past Surgical History:  Procedure Laterality Date  . ABDOMINAL HYSTERECTOMY    . HERNIA REPAIR  07/15/2017  . prolapse rectum surgery N/A July 2016  . TONSILLECTOMY       Prior to Admission medications   Medication Sig Start Date End Date Taking? Authorizing Provider  ACCU-CHEK AVIVA PLUS test strip  05/03/18   [provider]  acetaminophen (TYLENOL) 325 MG tablet Take 1 tablet (325 mg total) by mouth every 6 (six) hours as needed for mild pain (or Fever >/= 101). 09/18/16   Gouru, Aruna, MD  aspirin EC 81 MG tablet Take 81 mg by mouth daily at 12 noon.    [provider]  atorvastatin (LIPITOR) 40 MG tablet Take 40 mg by mouth at bedtime.     [provider]  azelastine (ASTELIN) 0.1 % nasal spray Place 1 spray into both nostrils daily.    [provider]  busPIRone (BUSPAR) 10 MG tablet Take 2 tablets (20 mg total) by mouth 3 (three) times daily. 07/08/18   Ursula Alert, MD  CHANTIX 0.5 MG tablet  09/03/18   [provider]  dicyclomine (BENTYL) 10 MG capsule  05/20/18   [provider]  diphenhydrAMINE (BENADRYL) 25 mg capsule TAKE 1 CAPSULE BY MOUTH TWICE DAILY AS NEEDED FOR ITCHING 09/09/18   Ursula Alert, MD  diphenoxylate-atropine (LOMOTIL) 2.5-0.025 MG tablet Take by mouth. 12/25/17 11/29/18  [provider]  divalproex (DEPAKOTE) 250 MG DR tablet Take 1 tablet (250 mg total) by mouth 3 (three) times daily. 09/15/18   Ursula Alert, MD  Dulaglutide 0.75 MG/0.5ML SOPN Inject into the skin. 07/15/18   [provider]  ipratropium (ATROVENT) 0.03 % nasal spray Place into the nose. 08/10/16 01/06/19  [provider]  KLOR-CON M20 20 MEQ tablet Take 20 mEq by  mouth daily.  10/13/15   [provider]  Lancets (ACCU-CHEK SAFE-T PRO) lancets  09/30/17   [provider]  levocetirizine (XYZAL) 5 MG tablet  06/04/18   [provider]  levothyroxine (SYNTHROID) 25 MCG tablet  09/03/18   [provider]  levothyroxine (SYNTHROID, LEVOTHROID) 200 MCG tablet Take 200 mcg by mouth daily.     [provider]  Magnesium Oxide 500 MG CAPS Take 1 capsule (500 mg total) by mouth 2 (two) times daily at 8 am and 10 pm. 07/30/18 10/28/18  Milinda Pointer, MD  meloxicam (MOBIC) 15 MG tablet Take 1 tablet (15 mg total) by mouth daily with breakfast for 20 days. Max: 1/day 10/08/18 10/28/18  Milinda Pointer, MD  metFORMIN (GLUCOPHAGE) 500 MG tablet  09/03/18   [provider]  methocarbamol (ROBAXIN) 750 MG tablet Take 1 tablet (750 mg total) by mouth every 8 (eight) hours as needed for muscle spasms. 07/30/18 10/28/18  Milinda Pointer, MD  montelukast (SINGULAIR) 10 MG tablet Take 10 mg by mouth daily.     [provider]  MYRBETRIQ 50 MG TB24 tablet  12/19/17   [provider]  naproxen (NAPROSYN) 500 MG tablet  09/01/18   [provider]  NICOTROL 10 MG inhaler  11/11/17   [provider]  nitroGLYCERIN (NITROSTAT) 0.4 MG SL tablet Place under the tongue. 05/14/17   [provider]  omega-3 acid ethyl esters (LOVAZA) 1 g capsule  08/15/17   [provider]  omeprazole (PRILOSEC) 20 MG capsule Take 1 capsule (20 mg total) by mouth at bedtime for 20 days. 10/08/18 10/28/18  Milinda Pointer, MD  ondansetron (ZOFRAN-ODT) 8 MG disintegrating tablet Take 8 mg by mouth as needed. 01/21/18   [provider]  pregabalin (LYRICA) 75 MG capsule Take 1 capsule (75 mg total) by mouth 2 (two) times daily for 15 days, THEN 1 capsule (75 mg total) 3 (three) times daily for 15 days. 08/25/18 09/24/18  Milinda Pointer, MD  prochlorperazine (COMPAZINE) 10 MG tablet  06/04/18    [provider]  propranolol (INDERAL) 20 MG tablet TAKE 1 TABLET BY MOUTH 3 TIMES A DAY AS NEEDED FOR ANXIETY 09/19/18   Ursula Alert, MD  QUEtiapine (SEROQUEL) 100 MG tablet Take 1-1.5 tablets (100-150 mg total) by mouth at bedtime as needed. 09/10/18   Ursula Alert, MD  rizatriptan (MAXALT) 10 MG tablet Take 1 tablet (10 mg total) by mouth as needed. 06/03/18   Vevelyn Francois, NP  torsemide [facility]) 20 MG tablet Take by mouth. 06/09/18 06/09/19  [provider]  traMADol (ULTRAM) 50 MG tablet Take 1-2 tablets (50-100 mg total) by mouth every 6 (six) hours as needed for severe pain. 09/18/18 12/17/18  Milinda Pointer, MD  TRELEGY ELLIPTA 100-62.5-25 MCG/INH AEPB  08/19/17   [provider]  Enid Cutter  HFA 108 (90 Base) MCG/ACT inhaler  05/03/18   [provider]     Allergies Diazepam, Levofloxacin, Metronidazole, Pregabalin, Sulfa antibiotics, Cephalexin, Ciprofloxacin, Ziprasidone hcl, and Penicillins   Family History  Problem Relation Age of Onset  . Diabetes Mellitus II Mother   . CAD Mother   . Sleep apnea Mother   . Osteoarthritis Mother   . Osteoporosis Mother   . Anxiety disorder Mother   . Depression Mother   . Bipolar disorder Mother   . Bipolar disorder Father   . Hypertension Father   . Depression Father   . Anxiety disorder Father   . Post-traumatic stress disorder Sister     Social History Social History   Tobacco Use  . Smoking status: Former Smoker    Packs/day: 0.50    Types: Cigarettes  . Smokeless tobacco: Never Used  Substance Use Topics  . Alcohol use: No    Alcohol/week: 0.0 standard drinks  . Drug use: No    Review of Systems  Constitutional:   No fever or chills.  ENT:   No sore throat. No rhinorrhea. Cardiovascular:   No chest pain or syncope. Respiratory:   No dyspnea or cough. Gastrointestinal:   Positive as above for abdominal pain, vomiting and diarrhea.  Musculoskeletal:   Chronic low back pain at  baseline All other systems reviewed and are negative except as documented above in ROS and HPI.  ____________________________________________   PHYSICAL EXAM:  VITAL SIGNS: ED Triage Vitals  Enc Vitals Group     BP 09/29/18 0738 (!) 144/94     Pulse Rate 09/29/18 0738 (!) 102     Resp 09/29/18 0738 17     Temp 09/29/18 0738 98.6 F (37 C)     Temp Source 09/29/18 0738 Oral     SpO2 09/29/18 0738 100 %     Weight 09/29/18 0740 240 lb (108.9 kg)     Height 09/29/18 0740 5\' 4"  (1.626 m)     Head Circumference --      Peak Flow --      Pain Score 09/29/18 0740 9     Pain Loc --      Pain Edu? --      Excl. in [place]? --     Vital signs reviewed, nursing assessments reviewed.   Constitutional:   Alert and oriented to person and place. Non-toxic appearance. Eyes:   Conjunctivae are normal. EOMI. PERRL.  Positive multidirectional nystagmus ENT      Head:   Normocephalic and atraumatic.      Nose:   No congestion/rhinnorhea.       Mouth/Throat:   Dry mucous membranes, no pharyngeal erythema. No peritonsillar mass.       Neck:   No meningismus. Full ROM. Hematological/Lymphatic/Immunilogical:   No cervical lymphadenopathy. Cardiovascular:   Tachycardia heart rate 105. Symmetric bilateral radial and DP pulses.  No murmurs. Cap refill less than 2 seconds. Respiratory:   Normal respiratory effort without tachypnea/retractions. Breath sounds are clear and equal bilaterally. No wheezes/rales/rhonchi. Gastrointestinal:   Soft and nontender. Non distended. There is no CVA tenderness.  No rebound, rigidity, or guarding.  Ventral incision well-healed with only a small superficial skin defect in the midline.  No inflammatory changes or tenderness.  Musculoskeletal:   Normal range of motion in all extremities. No joint effusions.  No lower extremity tenderness.  No edema. Neurologic:   Normal speech and language.  Motor grossly intact. No acute focal neurologic deficits are appreciated.  Skin:    Skin is warm, dry and intact. No rash noted.  No petechiae, purpura, or bullae.  ____________________________________________    LABS (pertinent positives/negatives) (all labs ordered are listed, but only abnormal results are displayed) Labs Reviewed  COMPREHENSIVE METABOLIC PANEL - Abnormal; Notable for the following components:      Result Value   Glucose, Bld 109 (*)    BUN 23 (*)    All other components within normal limits  ACETAMINOPHEN LEVEL - Abnormal; Notable for the following components:   Acetaminophen (Tylenol), Serum <10 (*)    All other components within normal limits  URINALYSIS, COMPLETE (UACMP) WITH MICROSCOPIC - Abnormal; Notable for the following components:   Color, Urine YELLOW (*)    APPearance CLEAR (*)    Ketones, ur 5 (*)    All other components within normal limits  LACTIC ACID, PLASMA - Abnormal; Notable for the following components:   Lactic Acid, Venous 2.1 (*)    All other components within normal limits  VALPROIC ACID LEVEL - Abnormal; Notable for the following components:   Valproic Acid Lvl 33 (*)    All other components within normal limits  SARS CORONAVIRUS 2 (HOSPITAL ORDER, [place] [facility])  ETHANOL  SALICYLATE LEVEL  URINE DRUG SCREEN, QUALITATIVE ([facility] ONLY)  CBC WITH DIFFERENTIAL/PLATELET  LIPASE, BLOOD  FIBRIN DERIVATIVES D-DIMER ([facility] ONLY)  PROTIME-INR  TROPONIN I (HIGH SENSITIVITY)   ____________________________________________   EKG  Interpreted by me Sinus tachycardia rate 106.  Normal axis and intervals.  Inferior Q waves with S1Q3T3 pattern, which is new compared to prior EKG June 08, 2018.  Normal ST segments and T waves..  ____________________________________________    RADIOLOGY  Ct Head Wo Contrast  Result Date: 09/29/2018 CLINICAL DATA:  Altered level of consciousness EXAM: CT HEAD WITHOUT CONTRAST TECHNIQUE: Contiguous axial images were obtained from the base of the skull  through the vertex without intravenous contrast. COMPARISON:  Head CT and brain MRI September 20, 2016 FINDINGS: Brain: The ventricles are normal in size and configuration. There is no intracranial mass, hemorrhage, extra-axial fluid collection, or midline shift. The brain parenchyma appears unremarkable. No evident acute infarct. Vascular: There is no hyperdense vessel. There is no appreciable vascular calcification. Skull: Bony calvarium appears intact. Sinuses/Orbits: There is slight mucosal thickening in the inferior right maxillary antrum. There is slight mucosal thickening in several ethmoid air cells. Other visualized paranasal sinuses are clear. Orbits appear symmetric bilaterally. Other: Mastoid air cells are clear. IMPRESSION: Slight paranasal sinus disease.  Study otherwise unremarkable. Electronically Signed   By: Lowella Grip III M.D.   On: 09/29/2018 09:42   Ct Abdomen Pelvis W Contrast  Result Date: 09/29/2018 CLINICAL DATA:  Acute onset abdominal pain, nausea and vomiting, and diarrhea last night. EXAM: CT ABDOMEN AND PELVIS WITH CONTRAST TECHNIQUE: Multidetector CT imaging of the abdomen and pelvis was performed using the standard protocol following bolus administration of intravenous contrast. CONTRAST:  15mL OMNIPAQUE IOHEXOL 300 MG/ML  SOLN COMPARISON:  09/17/2018 and 09/06/2017 FINDINGS: Lower Chest: No acute findings. Hepatobiliary: No hepatic masses identified. Gallbladder is unremarkable. No evidence of biliary ductal dilatation. Pancreas:  No mass or inflammatory changes. Spleen: Within normal limits in size and appearance. Adrenals/Urinary Tract: Stable 2 cm left adrenal mass, consistent with benign adenoma. No evidence of renal masses or hydronephrosis. Unremarkable unopacified urinary bladder. Stomach/Bowel: No evidence of obstruction, inflammatory process or abnormal fluid collections. Anastomotic staples noted in the sigmoid colon. Normal appendix visualized. Vascular/Lymphatic:  No pathologically enlarged lymph nodes. No abdominal aortic aneurysm. Reproductive: Prior hysterectomy noted. Adnexal regions are unremarkable in appearance. Other: Stable tiny ventral abdominal wall incisional hernia containing a collapsed loop of small bowel. Musculoskeletal:  No suspicious bone lesions identified. IMPRESSION: 1. No acute findings within the abdomen or pelvis. 2. Stable tiny ventral abdominal wall incisional hernia. 3. Stable small benign left adrenal adenoma. Electronically Signed   By: Marlaine Hind M.D.   On: 09/29/2018 09:59    ____________________________________________   PROCEDURES Procedures  ____________________________________________  DIFFERENTIAL DIAGNOSIS   Incarcerated hernia, diverticulitis, gastritis, pancreatitis, biliary disease, bowel obstruction, intra-abdominal abscess, stroke, intracranial hemorrhage, dehydration, pulmonary embolism, non-STEMI, Depakote toxicity, opioid withdrawal.   CLINICAL IMPRESSION / ASSESSMENT AND PLAN / ED COURSE  Medications ordered in the ED: Medications  sodium chloride 0.9 % bolus 1,000 mL (0 mLs Intravenous Stopped 09/29/18 0941)  ondansetron (ZOFRAN) injection 4 mg (4 mg Intravenous Given 09/29/18 0840)  haloperidol lactate (HALDOL) injection 5 mg (5 mg Intravenous Given 09/29/18 0903)  iohexol (OMNIPAQUE) 300 MG/ML solution 100 mL (100 mLs Intravenous Contrast Given 09/29/18 0914)  sodium chloride 0.9 % bolus 1,000 mL (1,000 mLs Intravenous New Bag/Given 09/29/18 1358)  sodium chloride 0.9 % bolus 1,000 mL (1,000 mLs Intravenous New Bag/Given 09/29/18 1419)    Pertinent labs & imaging results that were available during my care of the patient were reviewed by me and considered in my medical decision making (see chart for details).  Marie Clarke was evaluated in Emergency Department on 09/29/2018 for the symptoms described in the history of present illness. She was evaluated in the context of the global COVID-19 pandemic, which  necessitated consideration that the patient might be at risk for infection with the SARS-CoV-2 virus that causes COVID-19. Institutional protocols and algorithms that pertain to the evaluation of patients at risk for COVID-19 are in a state of rapid change based on information released by regulatory bodies including the CDC and federal and state organizations. These policies and algorithms were followed during the patient's care in the ED.   Patient presents with periumbilical abdominal pain, vomiting diarrhea that started last night and resulted in an episode of likely syncope this morning at about 6:00 AM.  Seizure is also possible given her recent tramadol use, though much less likely.  Given her comorbidities and symptoms and exam this morning, differential is very broad.  Doubt AAA mesenteric ischemia aortic dissection carotid dissection vertebral artery dissection.  Patient is not septic on initial assessment.  We will proceed with lab panel, IV fluid hydration, CT head, CT abdomen pelvis.   ----------------------------------------- 2:47 PM on 09/29/2018 -----------------------------------------  CT head and CT abdomen pelvis are unremarkable.  Labs are actually reassuring to.  However, after IV fluids patient is still feeling very dizzy with any movement.  She reports try to get out of bed and was too unsteady to stand up.  Orthostatics are abnormal.  Patient is unsteady on her feet, consistent with orthostatic dizziness and presyncope related to dehydration.  She is not septic.  Given her comorbidities and prior history of stroke, will continue IV fluid hydration and .Hospitalize her for further clinical monitoring.  COVID screening ordered, discussed with hospitalist.  ____________________________________________   FINAL CLINICAL IMPRESSION(S) / ED DIAGNOSES    Final diagnoses:  Dehydration  Syncope, unspecified syncope type  Dizziness     ED Discharge Orders    None       Portions of this note were generated with dragon dictation software.  Dictation errors may occur despite best attempts at proofreading.   Carrie Mew, MD 09/29/18 [phone]

## 2018-09-29 NOTE — ED Notes (Signed)
Fall pad under pt. Pt was trying to get out of bed. Pt still feels dizzy.

## 2018-09-29 NOTE — ED Notes (Signed)
Patient transported to CT 

## 2018-09-29 NOTE — ED Notes (Signed)
Pt hitting call bell. RN in room to check on pt. Pt shaking all over but responds to nurse. When asked why shaking pt states "I don't know I can't stop shaking". Pt denies being cold".  Discussed with dr Joni Fears.  Pt has been very nauseated.  When returned to give haldol for nausea pt seems to keep holding breath and keeping eyes wide open; seems purposeful but difficult to assess.   Does have blink reflex.  Dr Joni Fears at bedside, haldol held until dr stafford at bedside to evaluate pt.  Per dr Joni Fears haldol given and pt taken to CT scan.  Pts uncle arrived and in room.

## 2018-09-29 NOTE — ED Triage Notes (Signed)
Pt states she woke up around 1am with N/V/D,not talking normal, off balance , unable to ambulate without assistance. Pt is covered in emesis on arrival lives with her aunt who states she is not her normal self on arrival.

## 2018-09-29 NOTE — Progress Notes (Signed)
eeg completed ° °

## 2018-09-29 NOTE — ED Notes (Addendum)
Pt was cleaned and placed in gown on arrival.  Had stool and vomit all over when arrived. All clothing thrown out per family and patient request and as they had vomit and stool on them.

## 2018-09-29 NOTE — Progress Notes (Signed)
Pt arrived via stretcher from the ED at 1751. Pt was A&Ox4. VSS. Orders reviewed, acknowledged, and initatied. Pt c/o 10/10 pain in her head. PRN pain medication was given. Pt asked not to get OOB without assistance. Pt verbalized understanding. Pt oriented to room and bedside equipment. Pt admitted d/t fall so pt is in a low bed and a fall risk bracelet was applied. Pt has surgical incision on lower abdomen covered by gauze and type. Care plan and education initiated. Bed in lowest position, bed alarm is on, and the call bell is within reach.

## 2018-09-29 NOTE — H&P (Addendum)
[facility] NAME: Marie Clarke    MR#:  [id number]  DATE OF BIRTH:  Jun 17, 1970  DATE OF ADMISSION:  09/29/2018  PRIMARY CARE PHYSICIAN: Sharyne Peach, MD   REQUESTING/REFERRING PHYSICIAN: Brenton Grills, MD  CHIEF COMPLAINT:   Chief Complaint  Patient presents with   Altered Mental Status    HISTORY OF PRESENT ILLNESS:   48 year old female with past medical history of anemia, anxiety, bipolar disorder, CAD, CHF, COPD, depression, diabetes mellitus type 2, drug overdose, GERD, migraine headaches, hyperlipidemia, hypertension, MI, sleep apnea, TIA, renal insufficiency, rectal prolapse status post repair, and repair of incarcerated incisional hernia with a nonhealing midline incision presenting to the ED with complaints of nausea, vomiting, diarrhea, postural dizziness and syncope.  Patient states she woke up around 1 am with symptoms, she said she had difficulty talking, was off balance and unable to ambulate without assistance.  Patient also complaining of periumbilical pain there is been ongoing for a few weeks as well as nausea vomiting and diarrhea that started last night.  Patient reports multiple episodes of emesis and diarrhea, and this morning at 6 AM when she got up she apparently had a loss of consciousness and fall to the floor.  She does not recall the fall however she was found on the floor surrounded by emesis and stool by her aunt.  Denies associated symptoms of palpitation, chest pain, shortness of breath, and diaphoresis prior to falling.  Patient does not recall hitting her head, no reports of seizure or seizure-like activity.  On arrival to the ED, he was afebrile with blood pressure 144/94 mm Hg and pulse rate 102 beats/min.  She is noted to be orthostatic in the ED.  There were no focal neurological deficits; he was alert and oriented x4, she was however noted to be covered in emesis and stool.  Unclear if she lost  control of her bladder or bowel following episode of syncope.  Patient labs revealed unremarkable CBC and CMP, lactic acid 2.1, negative troponin, UA negative for UTI.  Initial noncontrast CT head did not show acute intracranial abnormality.  CT abdomen and pelvis also negative for acute abnormality.  Patient was treated with IV fluids however following treatment she is still feeling very dizzy with movement.  Given this finding patient will be hospitalized for further clinical monitoring.  PAST MEDICAL HISTORY:   Past Medical History:  Diagnosis Date   Anemia    Anxiety    Asthma    Bipolar disorder (HCC)    CAD (coronary artery disease) unk   CHF (congestive heart failure) (HCC)    COPD (chronic obstructive pulmonary disease) (HCC)    Depression unk   Diabetes mellitus without complication ([place])    Diabetes mellitus, type II ([place])    Drug overdose    GERD (gastroesophageal reflux disease)    Headache    Hyperlipidemia    Hypertension    Left leg pain 04/29/2014   MI (myocardial infarction) ([place])    Muscle ache 09/16/2014   Osteoporosis    Overactive bladder    Pancreatitis unk   Reflex sympathetic dystrophy    Renal insufficiency    Sleep apnea    pt reported on 2/6/7 she currently is not using CPAP   Sleep apnea    Stroke [facility])    Thyroid disease    TIA (transient ischemic attack) unk   TIA (transient ischemic attack)     PAST SURGICAL  HISTORY:   Past Surgical History:  Procedure Laterality Date   ABDOMINAL HYSTERECTOMY     HERNIA REPAIR  07/15/2017   prolapse rectum surgery N/A July 2016   TONSILLECTOMY      SOCIAL HISTORY:   Social History   Tobacco Use   Smoking status: Former Smoker    Packs/day: 0.50    Types: Cigarettes   Smokeless tobacco: Never Used  Substance Use Topics   Alcohol use: No    Alcohol/week: 0.0 standard drinks    FAMILY HISTORY:   Family History  Problem Relation Age of Onset   Diabetes  Mellitus II Mother    CAD Mother    Sleep apnea Mother    Osteoarthritis Mother    Osteoporosis Mother    Anxiety disorder Mother    Depression Mother    Bipolar disorder Mother    Bipolar disorder Father    Hypertension Father    Depression Father    Anxiety disorder Father    Post-traumatic stress disorder Sister     DRUG ALLERGIES:   Allergies  Allergen Reactions   Diazepam Hives and Nausea And Vomiting   Levofloxacin Hives and Rash    1/31: would like to retry since not taking valium   Metronidazole Hives    1/31: would like to retry since she is no longer taking valium   Pregabalin Hives and Rash    1/31 currently taking lyrica without reaction since no longer taking valium   Sulfa Antibiotics Hives   Cephalexin Hives   Ciprofloxacin Hives   Ziprasidone Hcl Other (See Comments)    CONFUSED CONFUSED Other reaction(s): Other (See Comments) CONFUSED CONFUSED    Penicillins Nausea And Vomiting    Has patient had a PCN reaction causing immediate rash, facial/tongue/throat swelling, SOB or lightheadedness with hypotension: No Has patient had a PCN reaction causing severe rash involving mucus membranes or skin necrosis: No Has patient had a PCN reaction that required hospitalization: No Has patient had a PCN reaction occurring within the last 10 years: No If all of the above answers are "NO", then may proceed with Cephalosporin use.     REVIEW OF SYSTEMS:   Review of Systems  Constitutional: Negative for chills, fever, malaise/fatigue and weight loss.  HENT: Negative for congestion, hearing loss and sore throat.   Eyes: Negative for blurred vision and double vision.  Respiratory: Negative for cough, shortness of breath and wheezing.   Cardiovascular: Negative for chest pain, palpitations, orthopnea and leg swelling.  Gastrointestinal: Positive for abdominal pain, diarrhea, nausea and vomiting.  Genitourinary: Negative for dysuria and urgency.    Musculoskeletal: Negative for myalgias.  Skin: Negative for rash.  Neurological: Positive for dizziness and weakness. Negative for sensory change, speech change, focal weakness and headaches.  Psychiatric/Behavioral: Negative for depression.   MEDICATIONS AT HOME:   Prior to Admission medications   Medication Sig Start Date End Date Taking? Authorizing Provider  ACCU-CHEK AVIVA PLUS test strip  05/03/18   [provider]  acetaminophen (TYLENOL) 325 MG tablet Take 1 tablet (325 mg total) by mouth every 6 (six) hours as needed for mild pain (or Fever >/= 101). 09/18/16   Gouru, Aruna, MD  aspirin EC 81 MG tablet Take 81 mg by mouth daily at 12 noon.    [provider]  atorvastatin (LIPITOR) 40 MG tablet Take 40 mg by mouth at bedtime.     [provider]  azelastine (ASTELIN) 0.1 % nasal spray Place 1 spray into both  nostrils daily.    [provider]  busPIRone (BUSPAR) 10 MG tablet Take 2 tablets (20 mg total) by mouth 3 (three) times daily. 07/08/18   Ursula Alert, MD  CHANTIX 0.5 MG tablet  09/03/18   [provider]  dicyclomine (BENTYL) 10 MG capsule  05/20/18   [provider]  diphenhydrAMINE (BENADRYL) 25 mg capsule TAKE 1 CAPSULE BY MOUTH TWICE DAILY AS NEEDED FOR ITCHING 09/09/18   Ursula Alert, MD  diphenoxylate-atropine (LOMOTIL) 2.5-0.025 MG tablet Take by mouth. 12/25/17 11/29/18  [provider]  divalproex (DEPAKOTE) 250 MG DR tablet Take 1 tablet (250 mg total) by mouth 3 (three) times daily. 09/15/18   Ursula Alert, MD  Dulaglutide 0.75 MG/0.5ML SOPN Inject into the skin. 07/15/18   [provider]  ipratropium (ATROVENT) 0.03 % nasal spray Place into the nose. 08/10/16 01/06/19  [provider]  KLOR-CON M20 20 MEQ tablet Take 20 mEq by mouth daily.  10/13/15   [provider]  Lancets (ACCU-CHEK SAFE-T PRO) lancets  09/30/17   [provider]  levocetirizine (XYZAL) 5 MG tablet   06/04/18   [provider]  levothyroxine (SYNTHROID) 25 MCG tablet  09/03/18   [provider]  levothyroxine (SYNTHROID, LEVOTHROID) 200 MCG tablet Take 200 mcg by mouth daily.     [provider]  Magnesium Oxide 500 MG CAPS Take 1 capsule (500 mg total) by mouth 2 (two) times daily at 8 am and 10 pm. 07/30/18 10/28/18  Milinda Pointer, MD  meloxicam (MOBIC) 15 MG tablet Take 1 tablet (15 mg total) by mouth daily with breakfast for 20 days. Max: 1/day 10/08/18 10/28/18  Milinda Pointer, MD  metFORMIN (GLUCOPHAGE) 500 MG tablet  09/03/18   [provider]  methocarbamol (ROBAXIN) 750 MG tablet Take 1 tablet (750 mg total) by mouth every 8 (eight) hours as needed for muscle spasms. 07/30/18 10/28/18  Milinda Pointer, MD  montelukast (SINGULAIR) 10 MG tablet Take 10 mg by mouth daily.     [provider]  MYRBETRIQ 50 MG TB24 tablet  12/19/17   [provider]  naproxen (NAPROSYN) 500 MG tablet  09/01/18   [provider]  NICOTROL 10 MG inhaler  11/11/17   [provider]  nitroGLYCERIN (NITROSTAT) 0.4 MG SL tablet Place under the tongue. 05/14/17   [provider]  omega-3 acid ethyl esters (LOVAZA) 1 g capsule  08/15/17   [provider]  omeprazole (PRILOSEC) 20 MG capsule Take 1 capsule (20 mg total) by mouth at bedtime for 20 days. 10/08/18 10/28/18  Milinda Pointer, MD  ondansetron (ZOFRAN-ODT) 8 MG disintegrating tablet Take 8 mg by mouth as needed. 01/21/18   [provider]  pregabalin (LYRICA) 75 MG capsule Take 1 capsule (75 mg total) by mouth 2 (two) times daily for 15 days, THEN 1 capsule (75 mg total) 3 (three) times daily for 15 days. 08/25/18 09/24/18  Milinda Pointer, MD  prochlorperazine (COMPAZINE) 10 MG tablet  06/04/18   [provider]  propranolol (INDERAL) 20 MG tablet TAKE 1 TABLET BY MOUTH 3 TIMES A DAY AS NEEDED FOR ANXIETY 09/19/18   Ursula Alert, MD  QUEtiapine  (SEROQUEL) 100 MG tablet Take 1-1.5 tablets (100-150 mg total) by mouth at bedtime as needed. 09/10/18   Ursula Alert, MD  rizatriptan (MAXALT) 10 MG tablet Take 1 tablet (10 mg total) by mouth as needed. 06/03/18   Vevelyn Francois, NP  torsemide (DEMADEX) 20 MG tablet Take by  mouth. 06/09/18 06/09/19  [provider]  traMADol (ULTRAM) 50 MG tablet Take 1-2 tablets (50-100 mg total) by mouth every 6 (six) hours as needed for severe pain. 09/18/18 12/17/18  Milinda Pointer, MD  TRELEGY ELLIPTA 100-62.5-25 MCG/INH AEPB  08/19/17   [provider]  VENTOLIN HFA 108 364 800 7703 Base) MCG/ACT inhaler  05/03/18   [provider]      VITAL SIGNS:  Blood pressure 125/79, pulse (!) 110, temperature 98.6 F (37 C), temperature source Oral, resp. rate 11, height 5\' 4"  (1.626 m), weight 108.9 kg, SpO2 99 %.  PHYSICAL EXAMINATION:   Physical Exam  GENERAL:  48 y.o.-year-old patient lying in the bed with no acute distress.  EYES: Pupils equal, round, reactive to light and accommodation. No scleral icterus. Extraocular muscles intact.  HEENT: Head atraumatic, normocephalic. Oropharynx and nasopharynx clear.  NECK:  Supple, no jugular venous distention. No thyroid enlargement, no tenderness.  LUNGS: Normal breath sounds bilaterally, no wheezing, rales,rhonchi or crepitation. No use of accessory muscles of respiration.  CARDIOVASCULAR: S1, S2 normal. No murmurs, rubs, or gallops.  ABDOMEN: Soft, nontender, nondistended. Bowel sounds present. No organomegaly or mass.  EXTREMITIES: No pedal edema, cyanosis, or clubbing. No rash or lesions. + pedal pulses MUSCULOSKELETAL: Normal bulk, and power was 5+ grip and elbow, knee, and ankle flexion and extension bilaterally.  NEUROLOGIC:Alert and oriented x 3. CN 2-12 intact. Sensation to light touch and cold stimuli intact bilaterally. Finger to nose nl. Babinski is downgoing. DTR's (biceps, patellar, and achilles) 2+ and symmetric throughout. Gait  not tested due to safety concern. PSYCHIATRIC: The patient is alert and oriented x 3.  SKIN: No obvious rash, lesion, or ulcer.   DATA REVIEWED:  LABORATORY PANEL:   CBC Recent Labs  Lab 09/29/18 0826  WBC 8.0  HGB 12.2  HCT 37.7  PLT 237   ------------------------------------------------------------------------------------------------------------------  Chemistries  Recent Labs  Lab 09/29/18 0826  NA 141  K 4.2  CL 106  CO2 27  GLUCOSE 109*  BUN 23*  CREATININE 0.88  CALCIUM 9.1  AST 18  ALT 14  ALKPHOS 86  BILITOT 0.5   ------------------------------------------------------------------------------------------------------------------  Cardiac Enzymes No results for input(s): TROPONINI in the last 168 hours. ------------------------------------------------------------------------------------------------------------------  RADIOLOGY:  Ct Head Wo Contrast  Result Date: 09/29/2018 CLINICAL DATA:  Altered level of consciousness EXAM: CT HEAD WITHOUT CONTRAST TECHNIQUE: Contiguous axial images were obtained from the base of the skull through the vertex without intravenous contrast. COMPARISON:  Head CT and brain MRI September 20, 2016 FINDINGS: Brain: The ventricles are normal in size and configuration. There is no intracranial mass, hemorrhage, extra-axial fluid collection, or midline shift. The brain parenchyma appears unremarkable. No evident acute infarct. Vascular: There is no hyperdense vessel. There is no appreciable vascular calcification. Skull: Bony calvarium appears intact. Sinuses/Orbits: There is slight mucosal thickening in the inferior right maxillary antrum. There is slight mucosal thickening in several ethmoid air cells. Other visualized paranasal sinuses are clear. Orbits appear symmetric bilaterally. Other: Mastoid air cells are clear. IMPRESSION: Slight paranasal sinus disease.  Study otherwise unremarkable. Electronically Signed   By: Lowella Grip III  M.D.   On: 09/29/2018 09:42   Ct Abdomen Pelvis W Contrast  Result Date: 09/29/2018 CLINICAL DATA:  Acute onset abdominal pain, nausea and vomiting, and diarrhea last night. EXAM: CT ABDOMEN AND PELVIS WITH CONTRAST TECHNIQUE: Multidetector CT imaging of the abdomen and pelvis was performed using the standard protocol following bolus administration of intravenous contrast. CONTRAST:  115mL OMNIPAQUE IOHEXOL 300 MG/ML  SOLN COMPARISON:  09/17/2018 and 09/06/2017 FINDINGS: Lower Chest: No acute findings. Hepatobiliary: No hepatic masses identified. Gallbladder is unremarkable. No evidence of biliary ductal dilatation. Pancreas:  No mass or inflammatory changes. Spleen: Within normal limits in size and appearance. Adrenals/Urinary Tract: Stable 2 cm left adrenal mass, consistent with benign adenoma. No evidence of renal masses or hydronephrosis. Unremarkable unopacified urinary bladder. Stomach/Bowel: No evidence of obstruction, inflammatory process or abnormal fluid collections. Anastomotic staples noted in the sigmoid colon. Normal appendix visualized. Vascular/Lymphatic: No pathologically enlarged lymph nodes. No abdominal aortic aneurysm. Reproductive: Prior hysterectomy noted. Adnexal regions are unremarkable in appearance. Other: Stable tiny ventral abdominal wall incisional hernia containing a collapsed loop of small bowel. Musculoskeletal:  No suspicious bone lesions identified. IMPRESSION: 1. No acute findings within the abdomen or pelvis. 2. Stable tiny ventral abdominal wall incisional hernia. 3. Stable small benign left adrenal adenoma. Electronically Signed   By: Marlaine Hind M.D.   On: 09/29/2018 09:59    EKG:  EKG: unchanged from previous tracings, sinus tachycardia. Vent. rate 106 BPM PR interval * ms QRS duration 110 ms QT/QTc 367/488 ms P-R-T axes 22 41 -16 IMPRESSION AND PLAN:   48 y.o. female  past medical history of anemia, anxiety, bipolar disorder, CAD, CHF, COPD, depression,  diabetes mellitus type 2, drug overdose, GERD, migraine headaches, hyperlipidemia, hypertension, MI, sleep apnea, TIA, renal insufficiency, rectal prolapse status post repair, and repair of incarcerated incisional hernia with a nonhealing midline incision presenting to the ED with complaints of nausea, vomiting, diarrhea, postural dizziness and syncope.  1. Postural dizziness and syncope -  Likely orthostatic given Abnormal Orthostatic Vital Signs related to dehydration from vomiting and diarrhea versus polypharmacy - Admit to telemetry unit - Recent EKG shows sinus tachycardia - Head CT negative for acute intracranial normality - Obtain EEG, hold Tramadol - check Cardiac Echo  - order Carotid Dopplers - Check TSH, UDS - Orthostatic vital signs - PT consult for unsteady gait  2. Abdominal pain associated with nausea, vomiting and diarrhea -Patient has history of ventral hernia status post repair of incarcerated incisional hernia with a non-healing midline incision *CT abdomen pelvis shows nothing acute compared to previous imaging. - Follows with Dr. Launa Flight at [facility] - Patient is scheduled for wound exploration and revision on 10/08/2018 Dr. Launa Flight - Discussed current admission status with Dr. Launa Flight at [facility] for potential transfer, Dr. Launa Flight states this is been an ongoing chronic issue and will continue to follow-up with her as scheduled.  He does not feel she needs acute transfer to [facility] at this time given this is a chronic recurrence. - Continue gentle IV fluids given history of CHF  - Check GI panel and C-diff  3. Diabetes Mellitus Type 2  - Hold metformin - SSI - DM education and close PCP follow up  4. HTN  -Hold antihypertensive in the setting of orthostatic  5. HLD  + Goal LDL<100 - Atorvastatin 40mg  PO qhs  6. Hypothyroidism -continue Synthroid  7. Bipolar disorder with depression -Continue Depakote, risperidone and quetiapine  8.  Chronic migraine headaches -continue  propranolol, tramadol, Maxalt as needed  9. Hx of CVA - Continue Aspirin   All the records are reviewed and case discussed with ED provider. Management plans discussed with the patient, family and they are in agreement.  CODE STATUS: FULL  TOTAL TIME TAKING CARE OF THIS PATIENT: 50 minutes.    on 09/29/2018 at 3:11 PM  Rufina Falco,  DNP, FNP-BC [facility] Nurse Practitioner Between 7am to 6pm - Pager [phone]  After 6pm go to [email or web address] - password [email or web address] [facility]  Office  [phone]  CC: Primary care physician; Sharyne Peach, MD

## 2018-09-30 ENCOUNTER — Observation Stay: Payer: Medicare HMO

## 2018-09-30 ENCOUNTER — Observation Stay
Admit: 2018-09-30 | Discharge: 2018-09-30 | Disposition: A | Discharge status: Home / Self Care | Payer: Medicare HMO | Attending: Nurse Practitioner | Admitting: Nurse Practitioner

## 2018-09-30 DIAGNOSIS — R55 Syncope and collapse: Secondary | ICD-10-CM | POA: Diagnosis not present

## 2018-09-30 DIAGNOSIS — E860 Dehydration: Secondary | ICD-10-CM | POA: Diagnosis not present

## 2018-09-30 LAB — BASIC METABOLIC PANEL
Anion gap: 4 — ABNORMAL LOW (ref 5–15)
BUN: 16 mg/dL (ref 6–20)
CO2: 25 mmol/L (ref 22–32)
Calcium: 8.2 mg/dL — ABNORMAL LOW (ref 8.9–10.3)
Chloride: 112 mmol/L — ABNORMAL HIGH (ref 98–111)
Creatinine, Ser: 0.79 mg/dL (ref 0.44–1.00)
GFR calc Af Amer: >60 mL/min (ref >60)
GFR calc non Af Amer: >60 mL/min (ref >60)
Glucose, Bld: 124 mg/dL — ABNORMAL HIGH (ref 70–99)
Potassium: 3.4 mmol/L — ABNORMAL LOW (ref 3.5–5.1)
Sodium: 141 mmol/L (ref 135–145)

## 2018-09-30 LAB — GLUCOSE, CAPILLARY
Glucose-Capillary: 95 mg/dL (ref 70–99)
Glucose-Capillary: 143 mg/dL — ABNORMAL HIGH (ref 70–99)
Glucose-Capillary: 72 mg/dL (ref 70–99)
Glucose-Capillary: 106 mg/dL — ABNORMAL HIGH (ref 70–99)

## 2018-09-30 MED ORDER — ENOXAPARIN SODIUM 40 MG/0.4ML ~~LOC~~ SOLN
40.0000 mg | Freq: Two times a day (BID) | SUBCUTANEOUS | Status: DC
  Filled 2018-09-30 (×2): qty 0.4

## 2018-09-30 MED ORDER — POTASSIUM CHLORIDE CRYS ER 20 MEQ PO TBCR
40.0000 meq | EXTENDED_RELEASE_TABLET | Freq: Once | ORAL | Status: AC
  Administered 2018-09-30: 40 meq via ORAL
  Filled 2018-09-30: qty 2

## 2018-09-30 NOTE — Procedures (Addendum)
Patient Name: Marie Clarke  MRN: [id number]  Epilepsy Attending: Lora Havens  Referring Physician/Provider: Rufina Falco, NP Date: 09/29/2018 Duration: 21.58 mins  Patient history: 48yo F with syncope. EEG to evaluate for seizure.  Level of alertness: Awake and asleep  Technical aspects: This EEG study was done with scalp electrodes positioned according to the 10-20 International system of electrode placement. Electrical activity was acquired at a sampling rate of 500Hz  and reviewed with a high frequency filter of 70Hz  and a low frequency filter of 1Hz . EEG data were recorded continuously and digitally stored.   DESCRIPTION: Posterior dominant rhythm: The posterior dominant rhythm consists of 8-9 Hz activity of moderate voltage (25-35 uV) seen predominantly in posterior head regions, symmetric and reactive to eye opening and eye closing. Sleep was characterized by vertex waves, maximal frontocentral. Physiologic photic driving was noted. Hyperventilation was not performed.  IMPRESSION: This study is within normal limits. No seizures or epileptiform discharges were seen throughout the recording.  Iasiah Ozment Barbra Sarks

## 2018-09-30 NOTE — Progress Notes (Signed)
*  PRELIMINARY RESULTS* Echocardiogram 2D Echocardiogram has been performed.  Marie Clarke 09/30/2018, 2:17 PM

## 2018-09-30 NOTE — Evaluation (Addendum)
Physical Therapy Evaluation Patient Details Name: Marie Clarke MRN: [id number] DOB: 1970-05-08 Today's Date: 09/30/2018   History of Present Illness  presented to ER secondary to fall/syncopal episode with LOC; admitted for management of postural dizziness with syncope.  Clinical Impression  Upon evaluation, patient alert and oriented; follows commands and eager for mobility with therapist.  Bilat UE/LE strength and ROM grossly symmetrical and WFL; no focal weakness, sensory or coordination deficit noted.  Able to complete sit/stand, basic transfers and gait (50' with Rw; 350' without assist device), mod indep.  Good LE strength/control, good balance and good symmetry/stability of movement.  Completes dynamic gait components without difficulty-no LOB, gait deviation or safety concern.  Gait speed [facility] for limited community ambulator; patient reporting feeling at/near baseline. Of note, vitals stable and WFL with transition to upright; no subjective pre-syncopal symptoms reported. HR elevation to 130s with exertion (asymptomatic), recovers to baseline (110s) with seated rest.  RN informed/aware. Patient appears to be at baseline level of functional ability; no acute PT needs identified at this time. Will complete initial PT order; please re-consult should needs change.      Follow Up Recommendations No PT follow up    Equipment Recommendations       Recommendations for Other Services       Precautions / Restrictions Precautions Precautions: None Restrictions Weight Bearing Restrictions: No      Mobility  Bed Mobility               General bed mobility comments: seated edge of bed beginning of session, in recliner end of session  Transfers Overall transfer level: Modified independent Equipment used: Rolling walker (2 wheeled);None             General transfer comment: completed with and without assist device indep; good LE strength, power and  control  Ambulation/Gait Ambulation/Gait assistance: Modified independent (Device/Increase time) Gait Distance (Feet): 50 Feet Assistive device: Rolling walker (2 wheeled)       General Gait Details: reciprocal steppign pattern, good step height/length, good gait symmetry.  Completes dynamic gait components without LOB, gait deviation or safety concern.  Stairs            Wheelchair Mobility    Modified Rankin (Stroke Patients Only)       Balance Overall balance assessment: Modified Independent                                           Pertinent Vitals/Pain Pain Assessment: No/denies pain    Home Living Family/patient expects to be discharged to:: Private residence Living Arrangements: Other relatives Available Help at Discharge: Family Type of Home: House Home Access: Stairs to enter Entrance Stairs-Rails: None Entrance Stairs-Number of Steps: 1 Home Layout: One level Home Equipment: None      Prior Function Level of Independence: Independent         Comments: Indep with ADLs, household and limited community mobilization without assist device; denies additional fall history.     Hand Dominance   Dominant Hand: Right    Extremity/Trunk Assessment   Upper Extremity Assessment Upper Extremity Assessment: Overall WFL for tasks assessed    Lower Extremity Assessment Lower Extremity Assessment: Overall WFL for tasks assessed(grossly at least 4/5 throughout (L LE not tested with resistance due to h/o RSD), no focal weakness, sensory or coordination deficit noted)       Communication  Communication: No difficulties  Cognition Arousal/Alertness: Awake/alert Behavior During Therapy: WFL for tasks assessed/performed Overall Cognitive Status: Within Functional Limits for tasks assessed                                        General Comments      Exercises Other Exercises Other Exercises: 350' without assist  device, sup/mod indep--good symmetry and overall gait pattern; no buckling, LOB or safety concern.  Good cadence and gait speed (10' walk time, 5-6 seconds), indicative of limited community ambulator.  Of note, HR elevation to 130s with exertion; asymptomatic and recovers to baseline (110s) with seated rest   Assessment/Plan    PT Assessment Patent does not need any further PT services  PT Problem List         PT Treatment Interventions      PT Goals (Current goals can be found in the Care Plan section)  Acute Rehab PT Goals Patient Stated Goal: to walk without the RW PT Goal Formulation: With patient Time For Goal Achievement: 09/30/18 Potential to Achieve Goals: Good    Frequency     Barriers to discharge        Co-evaluation               AM-PAC PT "6 Clicks" Mobility  Outcome Measure Help needed turning from your back to your side while in a flat bed without using bedrails?: None Help needed moving from lying on your back to sitting on the side of a flat bed without using bedrails?: None Help needed moving to and from a bed to a chair (including a wheelchair)?: None Help needed standing up from a chair using your arms (e.g., wheelchair or bedside chair)?: None Help needed to walk in hospital room?: None Help needed climbing 3-5 steps with a railing? : None 6 Click Score: 24    End of Session Equipment Utilized During Treatment: Gait belt Activity Tolerance: Patient tolerated treatment well Patient left: in chair;with call bell/phone within reach Nurse Communication: Mobility status PT Visit Diagnosis: Difficulty in walking, not elsewhere classified (R26.2);Muscle weakness (generalized) (M62.81)    Time: 5188-4166 PT Time Calculation (min) (ACUTE ONLY): 20 min   Charges:   PT Evaluation $PT Eval Moderate Complexity: 1 Mod PT Treatments $Gait Training: 8-22 mins        Lesslie Mckeehan H. Owens Shark, PT, DPT, NCS 09/30/18, 12:20 PM [phone]

## 2018-09-30 NOTE — Progress Notes (Signed)
[place] [facility] [place] NAME: Marie Clarke    MR#:  [id number]  DATE OF BIRTH:  29-Jan-1971  SUBJECTIVE: Admitted yesterday for altered mental status with difficulty walking on and off, patient also had orthostatic hypotension in the emergency room that prompted admission.  CHIEF COMPLAINT:   Chief Complaint  Patient presents with  . Altered Mental Status   She is alert, awake, oriented and eager to go home.  He says that she had nausea, vomiting, diarrhea yesterday.  No diarrhea, no nausea now and no vomiting.  Denies chest pain.  Wants a walker at discharge along with home physical therapy. REVIEW OF SYSTEMS:   ROS CONSTITUTIONAL: No fever, fatigue or weakness.  EYES: No blurred or double vision.  EARS, NOSE, AND THROAT: No tinnitus or ear pain.  RESPIRATORY: No cough, shortness of breath, wheezing or hemoptysis.  CARDIOVASCULAR: No chest pain, orthopnea, edema.  GASTROINTESTINAL: No nausea, vomiting, diarrhea or abdominal pain.  GENITOURINARY: No dysuria, hematuria.  ENDOCRINE: No polyuria, nocturia,  HEMATOLOGY: No anemia, easy bruising or bleeding SKIN: No rash or lesion. MUSCULOSKELETAL: No joint pain or arthritis.   NEUROLOGIC: No tingling, numbness, weakness.  PSYCHIATRY: No anxiety or depression.   DRUG ALLERGIES:   Allergies  Allergen Reactions  . Diazepam Hives and Nausea And Vomiting  . Levofloxacin Hives and Rash    1/31: would like to retry since not taking valium  . Metronidazole Hives    1/31: would like to retry since she is no longer taking valium  . Pregabalin Hives and Rash    1/31 currently taking lyrica without reaction since no longer taking valium  . Sulfa Antibiotics Hives  . Cephalexin Hives  . Ciprofloxacin Hives  . Ziprasidone Hcl Other (See Comments)    CONFUSED CONFUSED Other reaction(s): Other (See Comments) CONFUSED CONFUSED   . Penicillins Nausea And Vomiting    Has patient had a PCN  reaction causing immediate rash, facial/tongue/throat swelling, SOB or lightheadedness with hypotension: No Has patient had a PCN reaction causing severe rash involving mucus membranes or skin necrosis: No Has patient had a PCN reaction that required hospitalization: No Has patient had a PCN reaction occurring within the last 10 years: No If all of the above answers are "NO", then may proceed with Cephalosporin use.     VITALS:  Blood pressure 131/86, pulse 98, temperature 98.2 F (36.8 C), temperature source Oral, resp. rate 18, height 5\' 4"  (1.626 m), weight 108 kg, SpO2 100 %.  PHYSICAL EXAMINATION:  GENERAL:  48 y.o.-year-old patient lying in the bed with no acute distress.  EYES: Pupils equal, round, reactive to light . No scleral icterus. Extraocular muscles intact.  HEENT: Head atraumatic, normocephalic. Oropharynx and nasopharynx clear.  NECK:  Supple, no jugular venous distention. No thyroid enlargement, no tenderness.  LUNGS: Normal breath sounds bilaterally, no wheezing, rales,rhonchi or crepitation. No use of accessory muscles of respiration.  CARDIOVASCULAR: S1, S2 normal. No murmurs, rubs, or gallops.  ABDOMEN: Soft, nontender, nondistended. Bowel sounds present. No organomegaly or mass.  EXTREMITIES: No pedal edema, cyanosis, or clubbing.  NEUROLOGIC: Cranial nerves II through XII are intact. Muscle strength 5/5 in all extremities. Sensation intact. Gait not checked.  PSYCHIATRIC: The patient is alert and oriented x 3.  SKIN: No obvious rash, lesion, or ulcer.    LABORATORY PANEL:   CBC Recent Labs  Lab 09/29/18 0826  WBC 8.0  HGB 12.2  HCT 37.7  PLT 237   ------------------------------------------------------------------------------------------------------------------  Chemistries  Recent Labs  Lab 09/29/18 0826 09/30/18 0455  NA 141 141  K 4.2 3.4*  CL 106 112*  CO2 27 25  GLUCOSE 109* 124*  BUN 23* 16  CREATININE 0.88 0.79  CALCIUM 9.1 8.2*  AST  18  --   ALT 14  --   ALKPHOS 86  --   BILITOT 0.5  --    ------------------------------------------------------------------------------------------------------------------  Cardiac Enzymes No results for input(s): TROPONINI in the last 168 hours. ------------------------------------------------------------------------------------------------------------------  RADIOLOGY:  Ct Head Wo Contrast  Result Date: 09/29/2018 CLINICAL DATA:  Altered level of consciousness EXAM: CT HEAD WITHOUT CONTRAST TECHNIQUE: Contiguous axial images were obtained from the base of the skull through the vertex without intravenous contrast. COMPARISON:  Head CT and brain MRI September 20, 2016 FINDINGS: Brain: The ventricles are normal in size and configuration. There is no intracranial mass, hemorrhage, extra-axial fluid collection, or midline shift. The brain parenchyma appears unremarkable. No evident acute infarct. Vascular: There is no hyperdense vessel. There is no appreciable vascular calcification. Skull: Bony calvarium appears intact. Sinuses/Orbits: There is slight mucosal thickening in the inferior right maxillary antrum. There is slight mucosal thickening in several ethmoid air cells. Other visualized paranasal sinuses are clear. Orbits appear symmetric bilaterally. Other: Mastoid air cells are clear. IMPRESSION: Slight paranasal sinus disease.  Study otherwise unremarkable. Electronically Signed   By: Lowella Grip III M.D.   On: 09/29/2018 09:42   Ct Abdomen Pelvis W Contrast  Result Date: 09/29/2018 CLINICAL DATA:  Acute onset abdominal pain, nausea and vomiting, and diarrhea last night. EXAM: CT ABDOMEN AND PELVIS WITH CONTRAST TECHNIQUE: Multidetector CT imaging of the abdomen and pelvis was performed using the standard protocol following bolus administration of intravenous contrast. CONTRAST:  122mL OMNIPAQUE IOHEXOL 300 MG/ML  SOLN COMPARISON:  09/17/2018 and 09/06/2017 FINDINGS: Lower Chest: No acute  findings. Hepatobiliary: No hepatic masses identified. Gallbladder is unremarkable. No evidence of biliary ductal dilatation. Pancreas:  No mass or inflammatory changes. Spleen: Within normal limits in size and appearance. Adrenals/Urinary Tract: Stable 2 cm left adrenal mass, consistent with benign adenoma. No evidence of renal masses or hydronephrosis. Unremarkable unopacified urinary bladder. Stomach/Bowel: No evidence of obstruction, inflammatory process or abnormal fluid collections. Anastomotic staples noted in the sigmoid colon. Normal appendix visualized. Vascular/Lymphatic: No pathologically enlarged lymph nodes. No abdominal aortic aneurysm. Reproductive: Prior hysterectomy noted. Adnexal regions are unremarkable in appearance. Other: Stable tiny ventral abdominal wall incisional hernia containing a collapsed loop of small bowel. Musculoskeletal:  No suspicious bone lesions identified. IMPRESSION: 1. No acute findings within the abdomen or pelvis. 2. Stable tiny ventral abdominal wall incisional hernia. 3. Stable small benign left adrenal adenoma. Electronically Signed   By: Marlaine Hind M.D.   On: 09/29/2018 09:59   US Carotid Bilateral  Result Date: 09/30/2018 CLINICAL DATA:  48 year old female with syncope EXAM: BILATERAL CAROTID DUPLEX ULTRASOUND TECHNIQUE: Pearline Cables scale imaging, color Doppler and duplex ultrasound were performed of bilateral carotid and vertebral arteries in the neck. COMPARISON:  None. FINDINGS: Criteria: Quantification of carotid stenosis is based on velocity parameters that correlate the residual internal carotid diameter with NASCET-based stenosis levels, using the diameter of the distal internal carotid lumen as the denominator for stenosis measurement. The following velocity measurements were obtained: RIGHT ICA: 113/26 cm/sec CCA: 16/10 cm/sec SYSTOLIC ICA/CCA RATIO:  1.3 ECA:  136 cm/sec LEFT ICA: 80/29 cm/sec CCA: 96/04 cm/sec SYSTOLIC ICA/CCA RATIO:  0.9 ECA:  97 cm/sec  RIGHT CAROTID ARTERY: No significant atherosclerotic plaque  or evidence of stenosis. RIGHT VERTEBRAL ARTERY:  Patent with normal antegrade flow. LEFT CAROTID ARTERY: No significant atherosclerotic plaque or stenosis. LEFT VERTEBRAL ARTERY:  Patent with normal antegrade flow. IMPRESSION: 1. No significant atherosclerotic plaque or internal carotid artery stenosis. 2. Vertebral arteries are patent with normal antegrade flow. Signed, Criselda Peaches, MD, [facility] Electronically Signed   By: Jacqulynn Cadet M.D.   On: 09/30/2018 12:36    EKG:   Orders placed or performed during the hospital encounter of 09/29/18  . ED EKG  . ED EKG  . EKG 12-Lead  . EKG 12-Lead    ASSESSMENT AND PLAN:   48 year old female with history of bipolar disorder, CAD, CHF, COPD, depression, diabetes mellitus type 2, GERD, hyperlipidemia, hypertension, sleep apnea, TIA comes in because of nausea, vomiting, diarrhea, postural dizziness and syncope. 1.  Altered mental status, admitted for work-up of stroke/seizures.  Initial head CT unremarkable, EEG also did not show any seizure activity.    #2 postural dizziness, syncope with orthostatic hypotension in the emergency room likely due to dehydration the context of nausea, vomiting, diarrhea that happened at home, patient is on telemetry, no further postural hypotension.  Ultrasound of carotids is normal limits.  Echocardiogram pending.  3.  History of rectal prolapse repair in 2016, 17, history of incarcerated incisional hernia in 2019, nonhealing midline incision, patient is scheduled for wound exploration, revision surgery in August 26th 2020 at [facility] general surgery.  #3 history of bipolar disorder, continue home medicines. 4.  Diabetes mellitus type 2: Patient can take Trulicity at discharge.  Patient told me that she is not on metformin anymore. 5.  History of CHF with unknown EF, echocardiogram  ordered, results pending, restarted diuretics/  #6 unsteady gait, physical therapy consulted, they did not recommend any PT follow-up, however patient wants walker at discharge. 7.  Nausea, vomiting, diarrhea, subsided now, canceled GI panel. Possible discharge tomorrow. Hypokalemia, replace potassium. All the records are reviewed and case discussed with Care Management/Social Workerr. Management plans discussed with the patient, family and they are in agreement.  CODE STATUS: Full code  TOTAL TIME TAKING CARE OF THIS PATIENT: 40 minutes.   POSSIBLE D/C IN 1-2 DAYS, DEPENDING ON CLINICAL CONDITION. More than 50% time spent in counseling, coordination of care.  Epifanio Lesches M.D on 09/30/2018 at 2:08 PM  Between 7am to 6pm - Pager - [phone]  After 6pm go to [email or web address] - password [email or web address] [place] [facility]  Office  [phone]  CC: Primary care physician; Sharyne Peach, MD   Note: This dictation was prepared with Dragon dictation along with smaller phrase technology. Any transcriptional errors that result from this process are unintentional.

## 2018-09-30 NOTE — Care Management Obs Status (Signed)
[place] NOTIFICATION   Patient Details  Name: Marie Clarke MRN: [id number] Date of Birth: 08-Aug-1970   Medicare Observation Status Notification Given:  Yes    Cecil Cobbs 09/30/2018, 6:57 PM

## 2018-09-30 NOTE — Progress Notes (Signed)
Marie Clarke asked for prayer. Prayer was offered for [facility] health as well as her aunt and uncle whom she lives with. Prayer was also offered for her upcoming surgery. [name] had a deep faith and was very thankful for prayer.     09/30/18 2000  Clinical Encounter Type  Visited With Patient  Visit Type Initial  Referral From Patient  Spiritual Encounters  Spiritual Needs Prayer

## 2018-09-30 NOTE — Progress Notes (Signed)
PHARMACIST - PHYSICIAN COMMUNICATION  CONCERNING:  Enoxaparin (Lovenox) for DVT Prophylaxis    RECOMMENDATION: Patient was prescribed enoxaprin 40mg  q24 hours for VTE prophylaxis.   Filed Weights   09/29/18 0740 09/29/18 1805 09/30/18 0539  Weight: 240 lb (108.9 kg) 228 lb 8 oz (103.6 kg) 238 lb (108 kg)    Body mass index is 40.85 kg/m.  Estimated Creatinine Clearance: 105.4 mL/min (by C-G formula based on SCr of 0.79 mg/dL).   Based on [place] patient is candidate for enoxaparin 40mg  every 12 hour dosing due to BMI being >40 (40.85).  DESCRIPTION: Pharmacy has adjusted enoxaparin dose per [facility] policy.  Patient is now receiving enoxaparin 40mg  every 12 hours.   Lu Duffel, PharmD, BCPS Clinical Pharmacist 09/30/2018 10:44 AM

## 2018-09-30 NOTE — Plan of Care (Signed)

## 2018-09-30 NOTE — Progress Notes (Signed)
Patient's IV was painful and had infiltrated. Patient will be going home tomorrow. MD notified and said no need to replace at this time. I will continue to monitor possible need for PIV.

## 2018-10-01 DIAGNOSIS — E860 Dehydration: Secondary | ICD-10-CM | POA: Diagnosis not present

## 2018-10-01 LAB — GLUCOSE, CAPILLARY: Glucose-Capillary: 98 mg/dL (ref 70–99)

## 2018-10-01 LAB — ECHOCARDIOGRAM COMPLETE
Height: 64 in
Weight: 3808 oz

## 2018-10-01 LAB — HIV ANTIBODY (ROUTINE TESTING W REFLEX): HIV Screen 4th Generation wRfx: NONREACTIVE

## 2018-10-01 MED ORDER — NYSTATIN 100000 UNIT/GM EX POWD: Freq: Two times a day (BID) | CUTANEOUS | 0 refills | Status: DC

## 2018-10-01 MED ORDER — NYSTATIN 100000 UNIT/GM EX POWD
Freq: Two times a day (BID) | CUTANEOUS | Status: DC
  Filled 2018-10-01: qty 15

## 2018-10-01 NOTE — Progress Notes (Signed)
Pt discharged home via private car at 1050. Pt was A&Ox4. AVS reviewed with pt and all questions were answered. Pt left with cell phone, charger, and clothes. Pt wheeled downstairs by CNA.

## 2018-10-03 ENCOUNTER — Encounter: Payer: Self-pay | Admitting: Pain Medicine

## 2018-10-03 NOTE — Discharge Summary (Signed)
Marie Clarke, is a 48 y.o. female  DOB 30-Mar-1970  MRN [id number].  Admission date:  09/29/2018  Admitting Physician  Lang Snow, NP  Discharge Date:  10/01/2018   Primary MD  Sharyne Peach, MD  Recommendations for primary care physician for things to follow:   Follow-up with PCP in 1 week   Admission Diagnosis  Dehydration [E86.0] Dizziness [R42] Syncope [R55] Syncope, unspecified syncope type [R55] Postural dizziness with presyncope [R42, R55]   Discharge Diagnosis  Dehydration [E86.0] Dizziness [R42] Syncope [R55] Syncope, unspecified syncope type [R55] Postural dizziness with presyncope [R42, R55]    Active Problems:   Postural dizziness with presyncope      Past Medical History:  Diagnosis Date  . Anemia   . Anxiety   . Asthma   . Bipolar disorder ([place])   . CAD (coronary artery disease) unk  . CHF (congestive heart failure) ([place])   . COPD (chronic obstructive pulmonary disease) ([place])   . Depression unk  . Diabetes mellitus without complication ([place])   . Diabetes mellitus, type II ([place])   . Drug overdose   . GERD (gastroesophageal reflux disease)   . Headache   . Hyperlipidemia   . Hypertension   . Left leg pain 04/29/2014  . MI (myocardial infarction) ([place])   . Muscle ache 09/16/2014  . Osteoporosis   . Overactive bladder   . Pancreatitis unk  . Reflex sympathetic dystrophy   . Renal insufficiency   . Sleep apnea    pt reported on 2/6/7 she currently is not using CPAP  . Sleep apnea   . Stroke ([place])   . Thyroid disease   . TIA (transient ischemic attack) unk  . TIA (transient ischemic attack)     Past Surgical History:  Procedure Laterality Date  . ABDOMINAL HYSTERECTOMY    . HERNIA REPAIR  07/15/2017  . prolapse rectum surgery N/A July 2016  . TONSILLECTOMY          History of present illness and  Hospital Course:     Kindly see H&P for history of present illness and admission details, please review complete Labs, Consult reports and Test reports for all details in brief  HPI  from the history and physical done on the day of admission 48 year old female with multiple medical problems of bipolar disorder, CAD, CHF, COPD admitted because of dizziness, near syncope.  Patient also had altered mental status.   Hospital Course  #1 altered mental status secondary to dehydration, initial stroke work-up including CT of the head did not show any changes.  EEG did not show seizure activity.  Patient mental status improved, did not have very confusion. 2.  Postural dizziness, near syncope likely secondary to nausea, vomiting, diarrhea that happened at home.  Patient blood pressure, kidney function remained stable.  Patient's echocardiogram EF of 50 to 55% with normal ventricular cavity size.  Did not have any orthostatic hypotension. 3,.  History of rectal prolapse repair in 2016, 2017, patient had history of incarcerated hernia repair in 2019, now scheduled for exploration of the wound for nonhealing midline incision and is scheduled to go to [facility] on August 26 for general surgery appointment. #4 history of diabetes mellitus type 2: Patient takes Trulicity at discharge.  Patient is not taking metformin. 5.  Bipolar disorder, continue home medicines 6 chronic diastolic heart failure.  Patient takes diuretics at home. 7.  Unsteady gait, physical therapy saw the patient and did not recommend any physical therapy  and patient to gait is stable and patient went home in stable condition.     We feel that her dehydration with diarrhea and nausea and vomiting caused her to have a near syncopal episode and also symptoms resolved by the time of discharge and labs are essentially within normal limits and did not have any orthostatic hypotension.     Discharge  Condition:    Follow UP  Follow-up Information    Sharyne Peach, MD. Go on 10/08/2018.   Specialty: Family Medicine Why: Appointment at 9:20am Contact information: [address] [place] [address] [phone]             Discharge Instructions  and  Discharge Medications      Allergies as of 10/01/2018      Reactions   Diazepam Hives, Nausea And Vomiting   Levofloxacin Hives, Rash   1/31: would like to retry since not taking valium   Metronidazole Hives   1/31: would like to retry since she is no longer taking valium   Pregabalin Hives, Rash   1/31 currently taking lyrica without reaction since no longer taking valium   Sulfa Antibiotics Hives   Cephalexin Hives   Ciprofloxacin Hives   Ziprasidone Hcl Other (See Comments)   CONFUSED CONFUSED Other reaction(s): Other (See Comments) CONFUSED CONFUSED   Penicillins Nausea And Vomiting   Has patient had a PCN reaction causing immediate rash, facial/tongue/throat swelling, SOB or lightheadedness with hypotension: No Has patient had a PCN reaction causing severe rash involving mucus membranes or skin necrosis: No Has patient had a PCN reaction that required hospitalization: No Has patient had a PCN reaction occurring within the last 10 years: No If all of the above answers are "NO", then may proceed with Cephalosporin use.      Medication List    STOP taking these medications   amitriptyline 25 MG tablet Commonly known as: ELAVIL     TAKE these medications   acetaminophen 325 MG tablet Commonly known as: TYLENOL Take 1 tablet (325 mg total) by mouth every 6 (six) hours as needed for mild pain (or Fever >/= 101).   aspirin EC 81 MG tablet Take 81 mg by mouth daily at 12 noon.   atorvastatin 40 MG tablet Commonly known as: LIPITOR Take 40 mg by mouth at bedtime.   busPIRone 10 MG tablet Commonly known as: BUSPAR Take 2 tablets (20 mg total) by mouth 3 (three) times daily.   diphenhydrAMINE 25  mg capsule Commonly known as: BENADRYL TAKE 1 CAPSULE BY MOUTH TWICE DAILY AS NEEDED FOR ITCHING What changed: See the new instructions.   divalproex 250 MG DR tablet Commonly known as: DEPAKOTE Take 1 tablet (250 mg total) by mouth 3 (three) times daily.   Dulaglutide 0.75 MG/0.5ML Sopn Inject 0.75 mg into the skin every Wednesday.   ibuprofen 800 MG tablet Commonly known as: ADVIL Take 800 mg by mouth every 8 (eight) hours as needed for mild pain or moderate pain.   Klor-Con M20 20 MEQ tablet Generic drug: potassium chloride SA Take 20 mEq by mouth 2 (two) times daily.   levocetirizine 5 MG tablet Commonly known as: XYZAL Take 5 mg by mouth daily.   levothyroxine 200 MCG tablet Commonly known as: SYNTHROID Take 200 mcg by mouth daily. (232mcg total daily)   levothyroxine 25 MCG tablet Commonly known as: SYNTHROID Take 25 mcg by mouth daily. (245mcg total daily)   Magnesium Oxide 500 MG Caps Take 1 capsule (500  mg total) by mouth 2 (two) times daily at 8 am and 10 pm.   meloxicam 15 MG tablet Commonly known as: MOBIC Take 1 tablet (15 mg total) by mouth daily with breakfast for 20 days. Max: 1/day Start taking on: October 08, 2018   methocarbamol 750 MG tablet Commonly known as: ROBAXIN Take 1 tablet (750 mg total) by mouth every 8 (eight) hours as needed for muscle spasms.   montelukast 10 MG tablet Commonly known as: SINGULAIR Take 10 mg by mouth daily.   Myrbetriq 50 MG Tb24 tablet Generic drug: mirabegron ER Take 50 mg by mouth daily.   naproxen 500 MG tablet Commonly known as: NAPROSYN Take 500 mg by mouth 2 (two) times daily.   nitroGLYCERIN 0.4 MG SL tablet Commonly known as: NITROSTAT Place 0.4 mg under the tongue every 5 (five) minutes as needed for chest pain.   nystatin powder Commonly known as: MYCOSTATIN/NYSTOP Apply topically 2 (two) times daily.   omeprazole 20 MG capsule Commonly known as: PRILOSEC Take 1 capsule (20 mg total) by mouth  at bedtime for 20 days. Start taking on: October 08, 2018   ondansetron 8 MG disintegrating tablet Commonly known as: ZOFRAN-ODT Take 8 mg by mouth every 8 (eight) hours as needed for nausea or vomiting.   pregabalin 75 MG capsule Commonly known as: LYRICA Take 75 mg by mouth 2 (two) times daily.   propranolol 20 MG tablet Commonly known as: INDERAL TAKE 1 TABLET BY MOUTH 3 TIMES A DAY AS NEEDED FOR ANXIETY What changed: See the new instructions.   QUEtiapine 100 MG tablet Commonly known as: SEROquel Take 1-1.5 tablets (100-150 mg total) by mouth at bedtime as needed.   rizatriptan 10 MG tablet Commonly known as: MAXALT Take 1 tablet (10 mg total) by mouth as needed. What changed: reasons to take this   torsemide 20 MG tablet Commonly known as: DEMADEX Take 40 mg by mouth 2 (two) times daily.   traMADol 50 MG tablet Commonly known as: ULTRAM Take 1-2 tablets (50-100 mg total) by mouth every 6 (six) hours as needed for severe pain.   Trelegy Ellipta 100-62.5-25 MCG/INH Aepb Generic drug: Fluticasone-Umeclidin-Vilant Inhale 1 puff into the lungs daily.   Ventolin HFA 108 (90 Base) MCG/ACT inhaler Generic drug: albuterol Inhale 1-2 puffs into the lungs every 6 (six) hours as needed for wheezing or shortness of breath.         Diet and Activity recommendation: See Discharge Instructions above   Consults obtained -physical therapy   Major procedures and Radiology Reports - PLEASE review detailed and final reports for all details, in brief -      Ct Head Wo Contrast  Result Date: 09/29/2018 CLINICAL DATA:  Altered level of consciousness EXAM: CT HEAD WITHOUT CONTRAST TECHNIQUE: Contiguous axial images were obtained from the base of the skull through the vertex without intravenous contrast. COMPARISON:  Head CT and brain MRI September 20, 2016 FINDINGS: Brain: The ventricles are normal in size and configuration. There is no intracranial mass, hemorrhage, extra-axial  fluid collection, or midline shift. The brain parenchyma appears unremarkable. No evident acute infarct. Vascular: There is no hyperdense vessel. There is no appreciable vascular calcification. Skull: Bony calvarium appears intact. Sinuses/Orbits: There is slight mucosal thickening in the inferior right maxillary antrum. There is slight mucosal thickening in several ethmoid air cells. Other visualized paranasal sinuses are clear. Orbits appear symmetric bilaterally. Other: Mastoid air cells are clear. IMPRESSION: Slight paranasal sinus disease.  Study otherwise  unremarkable. Electronically Signed   By: Lowella Grip III M.D.   On: 09/29/2018 09:42   Ct Abdomen Pelvis W Contrast  Result Date: 09/29/2018 CLINICAL DATA:  Acute onset abdominal pain, nausea and vomiting, and diarrhea last night. EXAM: CT ABDOMEN AND PELVIS WITH CONTRAST TECHNIQUE: Multidetector CT imaging of the abdomen and pelvis was performed using the standard protocol following bolus administration of intravenous contrast. CONTRAST:  179mL OMNIPAQUE IOHEXOL 300 MG/ML  SOLN COMPARISON:  09/17/2018 and 09/06/2017 FINDINGS: Lower Chest: No acute findings. Hepatobiliary: No hepatic masses identified. Gallbladder is unremarkable. No evidence of biliary ductal dilatation. Pancreas:  No mass or inflammatory changes. Spleen: Within normal limits in size and appearance. Adrenals/Urinary Tract: Stable 2 cm left adrenal mass, consistent with benign adenoma. No evidence of renal masses or hydronephrosis. Unremarkable unopacified urinary bladder. Stomach/Bowel: No evidence of obstruction, inflammatory process or abnormal fluid collections. Anastomotic staples noted in the sigmoid colon. Normal appendix visualized. Vascular/Lymphatic: No pathologically enlarged lymph nodes. No abdominal aortic aneurysm. Reproductive: Prior hysterectomy noted. Adnexal regions are unremarkable in appearance. Other: Stable tiny ventral abdominal wall incisional hernia  containing a collapsed loop of small bowel. Musculoskeletal:  No suspicious bone lesions identified. IMPRESSION: 1. No acute findings within the abdomen or pelvis. 2. Stable tiny ventral abdominal wall incisional hernia. 3. Stable small benign left adrenal adenoma. Electronically Signed   By: Marlaine Hind M.D.   On: 09/29/2018 09:59   Ct Abdomen Pelvis W Contrast  Result Date: 09/17/2018 CLINICAL DATA:  Acute generalized abdominal pain. EXAM: CT ABDOMEN AND PELVIS WITH CONTRAST TECHNIQUE: Multidetector CT imaging of the abdomen and pelvis was performed using the standard protocol following bolus administration of intravenous contrast. CONTRAST:  172mL OMNIPAQUE IOHEXOL 300 MG/ML  SOLN COMPARISON:  September 06, 2017 FINDINGS: Lower chest: No acute abnormality. Hepatobiliary: No focal liver abnormality is seen. No gallstones, gallbladder wall thickening, or biliary dilatation. Pancreas: Unremarkable. No pancreatic ductal dilatation or surrounding inflammatory changes. Spleen: Normal in size without focal abnormality. Adrenals/Urinary Tract: 2 cm intermediate density left adrenal mass, measuring 44 Hounsfield units. Stable in size from July 2019 and March 2018. Normal appearance of the bilateral kidneys and urinary bladder. Stomach/Bowel: Stomach is within normal limits. Appendix appears normal. No evidence of bowel wall thickening, distention, or inflammatory changes. Postsurgical changes of the sigmoid colon without complicating features. Vascular/Lymphatic: No significant vascular findings are present. No enlarged abdominal or pelvic lymph nodes. Reproductive: Status post hysterectomy. No adnexal masses. Other: For current tiny periumbilical anterior abdominal wall hernia which contains a short segment of small bowel. The segment of bowel within the hernial sac has normal appearance, however given the narrow neck of the hernia, there is potential for incarceration. Musculoskeletal: No acute or significant osseous  findings. IMPRESSION: 1. Tiny periumbilical anterior abdominal wall hernia which contains a short segment of small bowel. The segment of bowel within the hernial sac has normal appearance, however given the narrow neck of the hernia, there is potential for incarceration. 2. Otherwise no evidence of acute abnormalities within the abdomen or pelvis. 3. Stable 2 cm left adrenal mass. Electronically Signed   By: Fidela Salisbury M.D.   On: 09/17/2018 13:14   US Carotid Bilateral  Result Date: 09/30/2018 CLINICAL DATA:  48 year old female with syncope EXAM: BILATERAL CAROTID DUPLEX ULTRASOUND TECHNIQUE: Pearline Cables scale imaging, color Doppler and duplex ultrasound were performed of bilateral carotid and vertebral arteries in the neck. COMPARISON:  None. FINDINGS: Criteria: Quantification of carotid stenosis is based on velocity parameters that  correlate the residual internal carotid diameter with NASCET-based stenosis levels, using the diameter of the distal internal carotid lumen as the denominator for stenosis measurement. The following velocity measurements were obtained: RIGHT ICA: 113/26 cm/sec CCA: [id number] cm/sec SYSTOLIC ICA/CCA RATIO:  1.3 ECA:  136 cm/sec LEFT ICA: 80/29 cm/sec CCA: [id number] cm/sec SYSTOLIC ICA/CCA RATIO:  0.9 ECA:  97 cm/sec RIGHT CAROTID ARTERY: No significant atherosclerotic plaque or evidence of stenosis. RIGHT VERTEBRAL ARTERY:  Patent with normal antegrade flow. LEFT CAROTID ARTERY: No significant atherosclerotic plaque or stenosis. LEFT VERTEBRAL ARTERY:  Patent with normal antegrade flow. IMPRESSION: 1. No significant atherosclerotic plaque or internal carotid artery stenosis. 2. Vertebral arteries are patent with normal antegrade flow. Signed, Criselda Peaches, MD, [facility] Radiology Electronically Signed   By: Jacqulynn Cadet M.D.   On: 09/30/2018 12:36   [facility] C-arm 1-60 Min No Report  Result Date:  09/04/2018 Fluoro was used, but no Radiologist interpretation will be provided. Please refer to "NOTES" tab for provider progress note.   Micro Results     Recent Results (from the past 240 hour(s))  SARS Coronavirus 2 [facility] order, Performed in [facility] hospital lab) Nasopharyngeal Nasopharyngeal Swab     Status: None   Collection Time: 09/29/18  2:19 PM   Specimen: Nasopharyngeal Swab  Result Value Ref Range Status   SARS Coronavirus 2 NEGATIVE NEGATIVE Final    Comment: (NOTE) If result is NEGATIVE SARS-CoV-2 target nucleic acids are NOT DETECTED. The SARS-CoV-2 RNA is generally detectable in upper and lower  respiratory specimens during the acute phase of infection. The lowest  concentration of SARS-CoV-2 viral copies this assay can detect is 250  copies / mL. A negative result does not preclude SARS-CoV-2 infection  and should not be used as the sole basis for treatment or other  patient management decisions.  A negative result may occur with  improper specimen collection / handling, submission of specimen other  than nasopharyngeal swab, presence of viral mutation(s) within the  areas targeted by this assay, and inadequate number of viral copies  (<250 copies / mL). A negative result must be combined with clinical  observations, patient history, and epidemiological information. If result is POSITIVE SARS-CoV-2 target nucleic acids are DETECTED. The SARS-CoV-2 RNA is generally detectable in upper and lower  respiratory specimens dur ing the acute phase of infection.  Positive  results are indicative of active infection with SARS-CoV-2.  Clinical  correlation with patient history and other diagnostic information is  necessary to determine patient infection status.  Positive results do  not rule out bacterial infection or co-infection with other viruses. If result is PRESUMPTIVE POSTIVE SARS-CoV-2 nucleic acids MAY BE PRESENT.   A presumptive positive result was  obtained on the submitted specimen  and confirmed on repeat testing.  While 2019 novel coronavirus  (SARS-CoV-2) nucleic acids may be present in the submitted sample  additional confirmatory testing may be necessary for epidemiological  and / or clinical management purposes  to differentiate between  SARS-CoV-2 and other Sarbecovirus currently known to infect humans.  If clinically indicated additional testing with an alternate test  methodology [phone]) is advised. The SARS-CoV-2 RNA is generally  detectable in upper and lower respiratory sp ecimens during the acute  phase of infection. The expected result is Negative. Fact Sheet for Patients:  StrictlyIdeas.no Fact Sheet for Healthcare Providers: BankingDealers.co.za This test is not yet approved or cleared by the [place] FDA  and has been authorized for detection and/or diagnosis of SARS-CoV-2 by FDA under an Emergency Use Authorization (EUA).  This EUA will remain in effect (meaning this test can be used) for the duration of the COVID-19 declaration under Section 564(b)(1) of the Act, 21 U.S.C. section 360bbb-3(b)(1), unless the authorization is terminated or revoked sooner. Performed at [facility], [address], [place], [place] [address]        Today   Subjective:   [facility] today has no headache,no chest abdominal pain,no new weakness tingling or numbness, feels much better wants to go home today.  Objective:   Blood pressure 120/86, pulse (!) 108, temperature 98.3 F (36.8 C), temperature source Oral, resp. rate 18, height 5\' 4"  (1.626 m), weight 103.7 kg, SpO2 97 %.  No intake or output data in the 24 hours ending 10/03/18 1612  Exam Awake Alert, Oriented x 3, No new F.N deficits, Normal affect [place].AT,PERRAL Supple Neck,No JVD, No cervical lymphadenopathy appriciated.  Symmetrical Chest wall movement, Good air movement bilaterally, CTAB RRR,No  Gallops,Rubs or new Murmurs, No Parasternal Heave +ve B.Sounds, Abd Soft, Non tender, No organomegaly appriciated, No rebound -guarding or rigidity. No Cyanosis, Clubbing or edema, No new Rash or bruise  Data Review   CBC w Diff:  Lab Results  Component Value Date   WBC 8.0 09/29/2018   HGB 12.2 09/29/2018   HGB 12.3 02/22/2014   HCT 37.7 09/29/2018   HCT 38.1 02/22/2014   PLT 237 09/29/2018   PLT 223 02/22/2014   LYMPHOPCT 14 09/29/2018   MONOPCT 5 09/29/2018   EOSPCT 0 09/29/2018   BASOPCT 0 09/29/2018    CMP:  Lab Results  Component Value Date   NA 141 09/30/2018   NA 142 09/09/2017   NA 141 02/22/2014   K 3.4 (L) 09/30/2018   K 2.9 (L) 02/22/2014   CL 112 (H) 09/30/2018   CL 111 (H) 02/22/2014   CO2 25 09/30/2018   CO2 19 (L) 02/22/2014   BUN 16 09/30/2018   BUN 13 09/09/2017   BUN 25 (H) 02/22/2014   CREATININE 0.79 09/30/2018   CREATININE 1.13 02/22/2014   PROT 7.6 09/29/2018   PROT 7.3 09/09/2017   ALBUMIN 3.7 09/29/2018   ALBUMIN 4.6 09/09/2017   BILITOT 0.5 09/29/2018   BILITOT 0.2 09/09/2017   ALKPHOS 86 09/29/2018   AST 18 09/29/2018   ALT 14 09/29/2018  .   Total Time in preparing paper work, data evaluation and todays exam - 52 minutes  Epifanio Lesches M.D on 10/01/2018 at [place] PM    Note: This dictation was prepared with Dragon dictation along with smaller phrase technology. Any transcriptional errors that result from this process are unintentional.

## 2018-10-05 ENCOUNTER — Encounter: Payer: Self-pay | Admitting: Pain Medicine

## 2018-10-09 ENCOUNTER — Telehealth: Payer: Self-pay

## 2018-10-09 MED ORDER — PREGABALIN 75 MG PO CAPS: 75.0000 mg | ORAL_CAPSULE | Freq: Two times a day (BID) | ORAL | 2 refills | Status: DC

## 2018-10-09 NOTE — Telephone Encounter (Signed)
Will you send this in for Dr. Adalberto Cole patient? Last prescribed 08-25-18, with 0 refills.

## 2018-10-09 NOTE — Telephone Encounter (Signed)
She called back and is very upset and said she has been trying to get her lyrica for a week and no one will do anything for her. She said it's crazy and she has to go without her lyrica because we dont listen. I told her what the last message stated and that we would get Dr. Dossie Arbour to call out on Monday but she doesn't feel that she should have to wait. Her appt was on 09/18/18 and she states she told him she needed a refill.

## 2018-10-09 NOTE — Telephone Encounter (Signed)
Requested Prescriptions   Signed Prescriptions Disp Refills  . pregabalin (LYRICA) 75 MG capsule 60 capsule 2    Sig: Take 1 capsule (75 mg total) by mouth 2 (two) times daily.    Authorizing Provider: Gillis Santa

## 2018-10-09 NOTE — Telephone Encounter (Signed)
Patient notified per voicemail that script for Lyrica has been sent.

## 2018-10-15 ENCOUNTER — Other Ambulatory Visit: Payer: Self-pay

## 2018-10-15 ENCOUNTER — Ambulatory Visit: Payer: Medicare HMO | Attending: Pain Medicine | Admitting: Pain Medicine

## 2018-10-15 DIAGNOSIS — M79605 Pain in left leg: Secondary | ICD-10-CM | POA: Diagnosis not present

## 2018-10-15 DIAGNOSIS — M25572 Pain in left ankle and joints of left foot: Secondary | ICD-10-CM

## 2018-10-15 DIAGNOSIS — M545 Low back pain, unspecified: Secondary | ICD-10-CM

## 2018-10-15 DIAGNOSIS — M150 Primary generalized (osteo)arthritis: Secondary | ICD-10-CM

## 2018-10-15 DIAGNOSIS — R51 Headache: Secondary | ICD-10-CM

## 2018-10-15 DIAGNOSIS — G894 Chronic pain syndrome: Secondary | ICD-10-CM

## 2018-10-15 DIAGNOSIS — M25512 Pain in left shoulder: Secondary | ICD-10-CM

## 2018-10-15 DIAGNOSIS — M159 Polyosteoarthritis, unspecified: Secondary | ICD-10-CM

## 2018-10-15 DIAGNOSIS — M792 Neuralgia and neuritis, unspecified: Secondary | ICD-10-CM

## 2018-10-15 DIAGNOSIS — G90522 Complex regional pain syndrome I of left lower limb: Secondary | ICD-10-CM

## 2018-10-15 DIAGNOSIS — Z791 Long term (current) use of non-steroidal anti-inflammatories (NSAID): Secondary | ICD-10-CM

## 2018-10-15 DIAGNOSIS — R519 Headache, unspecified: Secondary | ICD-10-CM

## 2018-10-15 DIAGNOSIS — M542 Cervicalgia: Secondary | ICD-10-CM

## 2018-10-15 DIAGNOSIS — M8949 Other hypertrophic osteoarthropathy, multiple sites: Secondary | ICD-10-CM

## 2018-10-15 DIAGNOSIS — M25511 Pain in right shoulder: Secondary | ICD-10-CM

## 2018-10-15 DIAGNOSIS — K2960 Other gastritis without bleeding: Secondary | ICD-10-CM

## 2018-10-15 DIAGNOSIS — T39395A Adverse effect of other nonsteroidal anti-inflammatory drugs [NSAID], initial encounter: Secondary | ICD-10-CM

## 2018-10-15 DIAGNOSIS — M7918 Myalgia, other site: Secondary | ICD-10-CM

## 2018-10-15 DIAGNOSIS — K219 Gastro-esophageal reflux disease without esophagitis: Secondary | ICD-10-CM

## 2018-10-15 DIAGNOSIS — G8929 Other chronic pain: Secondary | ICD-10-CM

## 2018-10-15 MED ORDER — MELOXICAM 15 MG PO TABS: 15.0000 mg | ORAL_TABLET | Freq: Every day | ORAL | 5 refills | Status: DC

## 2018-10-15 MED ORDER — MAGNESIUM OXIDE -MG SUPPLEMENT 500 MG PO CAPS: 1.0000 | ORAL_CAPSULE | Freq: Two times a day (BID) | ORAL | 5 refills | Status: DC

## 2018-10-15 MED ORDER — METHOCARBAMOL 750 MG PO TABS: 750.0000 mg | ORAL_TABLET | Freq: Three times a day (TID) | ORAL | 5 refills | Status: DC | PRN

## 2018-10-15 MED ORDER — PREGABALIN 75 MG PO CAPS: 75.0000 mg | ORAL_CAPSULE | Freq: Three times a day (TID) | ORAL | 0 refills | Status: DC

## 2018-10-15 MED ORDER — PREGABALIN 100 MG PO CAPS: 100.0000 mg | ORAL_CAPSULE | Freq: Three times a day (TID) | ORAL | 0 refills | Status: DC

## 2018-10-15 MED ORDER — PREGABALIN 150 MG PO CAPS: 150.0000 mg | ORAL_CAPSULE | Freq: Two times a day (BID) | ORAL | 0 refills | Status: DC

## 2018-10-15 MED ORDER — TRAMADOL HCL 50 MG PO TABS: 50.0000 mg | ORAL_TABLET | Freq: Four times a day (QID) | ORAL | 3 refills | Status: DC

## 2018-10-15 MED ORDER — OMEPRAZOLE 20 MG PO CPDR: 20.0000 mg | DELAYED_RELEASE_CAPSULE | Freq: Every day | ORAL | 5 refills | Status: DC

## 2018-10-15 NOTE — Progress Notes (Signed)
Pain Management Virtual Encounter Note - Virtual Visit via Telephone Telehealth (real-time audio visits between healthcare provider and patient).   Patient's Phone No. & Preferred Pharmacy:  [phone] (home); [phone] (mobile); (Preferred) [phone] [email or web address]  [place], [place] [address] [place] [address] Phone: [phone] Fax: [phone]    Pre-screening note:  Our staff contacted [name] and offered her an "in person", "face-to-face" appointment versus a telephone encounter. She indicated preferring the telephone encounter, at this time.   Reason for Virtual Visit: COVID-19*  Social distancing based on CDC and AMA recommendations.   I contacted [name] on 10/15/2018 via telephone.      I clearly identified myself as Gaspar Cola, MD. I verified that I was speaking with the correct person using two identifiers (Name: Ranay Mccommons, and date of birth: [birth year]).  Advanced Informed Consent I sought verbal advanced consent from [facility] for virtual visit interactions. I informed [name] of possible security and privacy concerns, risks, and limitations associated with providing "not-in-person" medical evaluation and management services. I also informed [name] of the availability of "in-person" appointments. Finally, I informed her that there would be a charge for the virtual visit and that she could be  personally, fully or partially, financially responsible for it. Ms. Loughridge expressed understanding and agreed to proceed.   Historic Elements   Ms. Korena Hebron is a 48 y.o. year old, female patient evaluated today after her last encounter by our practice on 10/09/2018. Ms. Lakey  has a past medical history of Anemia, Anxiety, Asthma, Bipolar disorder ([place]), CAD (coronary artery disease) ([place]), CHF (congestive heart failure) ([place]), COPD (chronic obstructive pulmonary disease) ([place]), Depression ([place]), Diabetes  mellitus without complication ([place]), Diabetes mellitus, type II ([place]), Drug overdose, GERD (gastroesophageal reflux disease), Headache, Hyperlipidemia, Hypertension, Left leg pain (04/29/2014), MI (myocardial infarction) ([place]), Muscle ache (09/16/2014), Osteoporosis, Overactive bladder, Pancreatitis ([place]), Reflex sympathetic dystrophy, Renal insufficiency, Sleep apnea, Sleep apnea, Stroke ([place]), Thyroid disease, TIA (transient ischemic attack) ([place]), and TIA (transient ischemic attack). She also  has a past surgical history that includes prolapse rectum surgery (N/A, July 2016); Abdominal hysterectomy; Tonsillectomy; and Hernia repair (07/15/2017). Ms. Vanderkolk has a current medication list which includes the following prescription(s): acetaminophen, aspirin ec, atorvastatin, buspirone, diphenhydramine, divalproex, dulaglutide, klor-con m20, levocetirizine, levothyroxine, levothyroxine, magnesium oxide, meloxicam, methocarbamol, montelukast, myrbetriq, nitroglycerin, omeprazole, ondansetron, propranolol, quetiapine, rizatriptan, torsemide, tramadol, trelegy ellipta, ventolin hfa, nystatin, pregabalin, pregabalin, and pregabalin. She  reports that she has quit smoking. Her smoking use included cigarettes. She smoked 0.50 packs per day. She has never used smokeless tobacco. She reports that she does not drink alcohol or use drugs. Ms. Campion is allergic to diazepam; levofloxacin; metronidazole; sulfa antibiotics; cephalexin; ciprofloxacin; ziprasidone hcl; and penicillins.   HPI  Today, she is being contacted for medication management.  Although the patient is currently not having any type of side effects or problems with her medic patients, she indicates that she still having quite a bit of burning sensation in her back.  Because this is a type of neurogenic/neuropathic pain, today I turned to her Lyrica to determine how she was taking it and how much.  The first problem that I identified is that apparently she was not  taking it regularly like she should.  The patient was informed on how to take it and she will make the necessary changes.  In addition, the patient also mentioned that she used to take a lot more Lyrica  than she is currently getting and she thinks that she can tolerate an increase.  Because of this, today I have talked to the patient about increasing this Lyrica, as tolerated.  She is currently taking 75 mg p.o. twice daily for a total of 150 mg.  The first thing we will do is to bump this up to 3 times a day.  From here we will go to the Lyrica 100 mg, followed by 150 mg, as tolerated.  In addition to this the patient was wondering about the spinal cord stimulator trial that we had mentioned as a possibility.  As it turns out, she was not sent to the medical psychologist for an evaluation, as I thought we had done.  Today I will go ahead and enter again a second referral to the medical psychologist for a spinal cord stimulator implant evaluation.  As soon as the medical psychology evaluation is done, we will look at the possibility of doing a trial.  Pharmacotherapy Assessment  Analgesic: Tramadol 50 mg, 1-2 tab q 6 hrs (200-400 mg/day of tramadol) MME/day:20-40mg /day.   Monitoring: Pharmacotherapy: No side-effects or adverse reactions reported. [place] PMP: PDMP reviewed during this encounter.       Compliance: No problems identified. Effectiveness: Clinically acceptable. Plan: Refer to "POC".  UDS:  Summary  Date Value Ref Range Status  01/22/2018 FINAL  Final    Comment:    ==================================================================== TOXASSURE SELECT 13 (MW) ==================================================================== Test                             Result       Flag       Units Drug Present and Declared for Prescription Verification   Tramadol                       2742         EXPECTED   ng/mg creat   O-Desmethyltramadol            5630         EXPECTED   ng/mg creat    N-Desmethyltramadol            2591         EXPECTED   ng/mg creat    Source of tramadol is a prescription medication.    O-desmethyltramadol and N-desmethyltramadol are expected    metabolites of tramadol. ==================================================================== Test                      Result    Flag   Units      Ref Range   Creatinine              66               mg/dL      >=20 ==================================================================== Declared Medications:  The flagging and interpretation on this report are based on the  following declared medications.  Unexpected results may arise from  inaccuracies in the declared medications.  **Note: The testing scope of this panel includes these medications:  Tramadol  **Note: The testing scope of this panel does not include following  reported medications:  Acetaminophen  Aspirin (Aspirin 81)  Atorvastatin  Atropine (Lomotil)  Azelastine  Bumetanide  Buspirone  Diphenoxylate (Lomotil)  Fluticasone (Trelegy Ellipta)  Gabapentin  Hydroxyzine (Vistaril)  Lamotrigine  Levocetirizine (Xyzal)  Levothyroxine  Magnesium Oxide  Metformin  Methocarbamol  Mirabegron (Myrbetriq)  Montelukast  Nicotine  Omega-3 Fatty Acids  Ondansetron  Potassium  Potassium (Klor-Con)  Promethazine  Quetiapine  Rizatriptan  Umeclidinium (Trelegy Ellipta)  Vilanterol (Trelegy Ellipta) ==================================================================== For clinical consultation, please call [phone]. ====================================================================    Laboratory Chemistry Profile (12 mo)  Renal: 09/30/2018: BUN 16; Creatinine, Ser 0.79  Lab Results  Component Value Date   GFRAA >60 09/30/2018   GFRNONAA >60 09/30/2018   Hepatic: 09/29/2018: Albumin 3.7 Lab Results  Component Value Date   AST 18 09/29/2018   ALT 14 09/29/2018   Other: No results found for requested labs within last 8760  hours. Note: Above Lab results reviewed.  Imaging  Last 90 days:  Ct Head Wo Contrast  Result Date: 09/29/2018 CLINICAL DATA:  Altered level of consciousness EXAM: CT HEAD WITHOUT CONTRAST TECHNIQUE: Contiguous axial images were obtained from the base of the skull through the vertex without intravenous contrast. COMPARISON:  Head CT and brain MRI September 20, 2016 FINDINGS: Brain: The ventricles are normal in size and configuration. There is no intracranial mass, hemorrhage, extra-axial fluid collection, or midline shift. The brain parenchyma appears unremarkable. No evident acute infarct. Vascular: There is no hyperdense vessel. There is no appreciable vascular calcification. Skull: Bony calvarium appears intact. Sinuses/Orbits: There is slight mucosal thickening in the inferior right maxillary antrum. There is slight mucosal thickening in several ethmoid air cells. Other visualized paranasal sinuses are clear. Orbits appear symmetric bilaterally. Other: Mastoid air cells are clear. IMPRESSION: Slight paranasal sinus disease.  Study otherwise unremarkable. Electronically Signed   By: Lowella Grip III M.D.   On: 09/29/2018 09:42   Ct Abdomen Pelvis W Contrast  Result Date: 09/29/2018 CLINICAL DATA:  Acute onset abdominal pain, nausea and vomiting, and diarrhea last night. EXAM: CT ABDOMEN AND PELVIS WITH CONTRAST TECHNIQUE: Multidetector CT imaging of the abdomen and pelvis was performed using the standard protocol following bolus administration of intravenous contrast. CONTRAST:  146mL OMNIPAQUE IOHEXOL 300 MG/ML  SOLN COMPARISON:  09/17/2018 and 09/06/2017 FINDINGS: Lower Chest: No acute findings. Hepatobiliary: No hepatic masses identified. Gallbladder is unremarkable. No evidence of biliary ductal dilatation. Pancreas:  No mass or inflammatory changes. Spleen: Within normal limits in size and appearance. Adrenals/Urinary Tract: Stable 2 cm left adrenal mass, consistent with benign adenoma. No  evidence of renal masses or hydronephrosis. Unremarkable unopacified urinary bladder. Stomach/Bowel: No evidence of obstruction, inflammatory process or abnormal fluid collections. Anastomotic staples noted in the sigmoid colon. Normal appendix visualized. Vascular/Lymphatic: No pathologically enlarged lymph nodes. No abdominal aortic aneurysm. Reproductive: Prior hysterectomy noted. Adnexal regions are unremarkable in appearance. Other: Stable tiny ventral abdominal wall incisional hernia containing a collapsed loop of small bowel. Musculoskeletal:  No suspicious bone lesions identified. IMPRESSION: 1. No acute findings within the abdomen or pelvis. 2. Stable tiny ventral abdominal wall incisional hernia. 3. Stable small benign left adrenal adenoma. Electronically Signed   By: Marlaine Hind M.D.   On: 09/29/2018 09:59   Ct Abdomen Pelvis W Contrast  Result Date: 09/17/2018 CLINICAL DATA:  Acute generalized abdominal pain. EXAM: CT ABDOMEN AND PELVIS WITH CONTRAST TECHNIQUE: Multidetector CT imaging of the abdomen and pelvis was performed using the standard protocol following bolus administration of intravenous contrast. CONTRAST:  163mL OMNIPAQUE IOHEXOL 300 MG/ML  SOLN COMPARISON:  September 06, 2017 FINDINGS: Lower chest: No acute abnormality. Hepatobiliary: No focal liver abnormality is seen. No gallstones, gallbladder wall thickening, or biliary dilatation. Pancreas: Unremarkable. No pancreatic ductal dilatation or surrounding inflammatory changes. Spleen: Normal in size without  focal abnormality. Adrenals/Urinary Tract: 2 cm intermediate density left adrenal mass, measuring 44 Hounsfield units. Stable in size from July 2019 and March 2018. Normal appearance of the bilateral kidneys and urinary bladder. Stomach/Bowel: Stomach is within normal limits. Appendix appears normal. No evidence of bowel wall thickening, distention, or inflammatory changes. Postsurgical changes of the sigmoid colon without complicating  features. Vascular/Lymphatic: No significant vascular findings are present. No enlarged abdominal or pelvic lymph nodes. Reproductive: Status post hysterectomy. No adnexal masses. Other: For current tiny periumbilical anterior abdominal wall hernia which contains a short segment of small bowel. The segment of bowel within the hernial sac has normal appearance, however given the narrow neck of the hernia, there is potential for incarceration. Musculoskeletal: No acute or significant osseous findings. IMPRESSION: 1. Tiny periumbilical anterior abdominal wall hernia which contains a short segment of small bowel. The segment of bowel within the hernial sac has normal appearance, however given the narrow neck of the hernia, there is potential for incarceration. 2. Otherwise no evidence of acute abnormalities within the abdomen or pelvis. 3. Stable 2 cm left adrenal mass. Electronically Signed   By: Fidela Salisbury M.D.   On: 09/17/2018 13:14   US Carotid Bilateral  Result Date: 09/30/2018 CLINICAL DATA:  48 year old female with syncope EXAM: BILATERAL CAROTID DUPLEX ULTRASOUND TECHNIQUE: Pearline Cables scale imaging, color Doppler and duplex ultrasound were performed of bilateral carotid and vertebral arteries in the neck. COMPARISON:  None. FINDINGS: Criteria: Quantification of carotid stenosis is based on velocity parameters that correlate the residual internal carotid diameter with NASCET-based stenosis levels, using the diameter of the distal internal carotid lumen as the denominator for stenosis measurement. The following velocity measurements were obtained: RIGHT ICA: 113/26 cm/sec CCA: [id number] cm/sec SYSTOLIC ICA/CCA RATIO:  1.3 ECA:  136 cm/sec LEFT ICA: 80/29 cm/sec CCA: [id number] cm/sec SYSTOLIC ICA/CCA RATIO:  0.9 ECA:  97 cm/sec RIGHT CAROTID ARTERY: No significant atherosclerotic plaque or evidence of stenosis. RIGHT VERTEBRAL ARTERY:  Patent with normal antegrade flow. LEFT CAROTID ARTERY: No significant  atherosclerotic plaque or stenosis. LEFT VERTEBRAL ARTERY:  Patent with normal antegrade flow. IMPRESSION: 1. No significant atherosclerotic plaque or internal carotid artery stenosis. 2. Vertebral arteries are patent with normal antegrade flow. Signed, Criselda Peaches, MD, [facility] Electronically Signed   By: Jacqulynn Cadet M.D.   On: 09/30/2018 12:36   [facility] C-arm 1-60 Min No Report  Result Date: 09/04/2018 Fluoro was used, but no Radiologist interpretation will be provided. Please refer to "NOTES" tab for provider progress note.   Assessment  The primary encounter diagnosis was Chronic pain syndrome. Diagnoses of Chronic low back pain (Primary Area of Pain) (Bilateral) (L>R), Chronic lower extremity pain (Referred) (Secondary Area of Pain) (Left), Chronic ankle pain (Tertiary Area of Pain) (Left), Chronic neck pain (Fourth Area of Pain) (Bilateral) (L>R), Chronic shoulder pain (Fifth Area of Pain) (Bilateral) (L>R), Chronic musculoskeletal pain, Gastroesophageal reflux disease without esophagitis, Long term current use of non-steroidal anti-inflammatories (NSAID), NSAID induced gastritis, Occipital headache (Bilateral), Osteoarthritis involving multiple joints, Neurogenic pain, CRPS (complex regional pain syndrome) type 1 of lower limb (left ankle), and Neuropathic pain were also pertinent to this visit.  Plan of Care  I have discontinued Stanton Kidney Yarrow's naproxen, ibuprofen, pregabalin, and oxyCODONE. I have also changed her omeprazole, meloxicam, and traMADol. Additionally, I am having her start on pregabalin, pregabalin, and pregabalin. Lastly, I am having her maintain her atorvastatin, levothyroxine, montelukast, aspirin EC, Klor-Con M20,  acetaminophen, Trelegy Ellipta, Myrbetriq, Ventolin HFA, rizatriptan, levocetirizine, nitroGLYCERIN, torsemide, busPIRone, Dulaglutide, diphenhydrAMINE, levothyroxine, QUEtiapine,  divalproex, propranolol, ondansetron, nystatin, methocarbamol, and Magnesium Oxide.  Pharmacotherapy (Medications Ordered): Meds ordered this encounter  Medications  . methocarbamol (ROBAXIN) 750 MG tablet    Sig: Take 1 tablet (750 mg total) by mouth every 8 (eight) hours as needed for muscle spasms.    Dispense:  90 tablet    Refill:  5    Fill one day early if pharmacy is closed on scheduled refill date. May substitute for generic if available.  . Magnesium Oxide 500 MG CAPS    Sig: Take 1 capsule (500 mg total) by mouth 2 (two) times daily at 8 am and 10 pm.    Dispense:  60 capsule    Refill:  5    Fill one day early if pharmacy is closed on scheduled refill date. May substitute for generic if available.  Marland Kitchen omeprazole (PRILOSEC) 20 MG capsule    Sig: Take 1 capsule (20 mg total) by mouth at bedtime.    Dispense:  30 capsule    Refill:  5    Fill one day early if pharmacy is closed on scheduled refill date. May substitute for generic if available.  . meloxicam (MOBIC) 15 MG tablet    Sig: Take 1 tablet (15 mg total) by mouth daily with breakfast. Max: 1/day    Dispense:  30 tablet    Refill:  5    Fill one day early if pharmacy is closed on scheduled refill date. May substitute for generic if available.  . traMADol (ULTRAM) 50 MG tablet    Sig: Take 1-2 tablets (50-100 mg total) by mouth every 6 (six) hours.    Dispense:  240 tablet    Refill:  3    Chronic Pain: STOP Act (Not applicable) Fill 1 day early if closed on refill date. Do not fill until: 12/17/2018. To last until: 04/16/2019. Avoid benzodiazepines within 8 hours of opioids  . pregabalin (LYRICA) 75 MG capsule    Sig: Take 1 capsule (75 mg total) by mouth 3 (three) times daily for 14 days.    Dispense:  42 capsule    Refill:  0    Fill one day early if pharmacy is closed on scheduled refill date. May substitute for generic if available.  . pregabalin (LYRICA) 100 MG capsule    Sig: Take 1 capsule (100 mg total) by  mouth 3 (three) times daily for 21 days.    Dispense:  63 capsule    Refill:  0    Fill one day early if pharmacy is closed on scheduled refill date. May substitute for generic if available.  . pregabalin (LYRICA) 150 MG capsule    Sig: Take 1 capsule (150 mg total) by mouth 2 (two) times daily.    Dispense:  290 capsule    Refill:  0    Fill one day early if pharmacy is closed on scheduled refill date. May substitute for generic if available.   Orders:  Orders Placed This Encounter  Procedures  . Ambulatory referral to Psychology    Referral Priority:   Routine    Referral Type:   Psychiatric    Referral Reason:   Specialty Services Required    Referred to Provider:   Lajoyce Lauber, MD    Requested Specialty:   Psychology    Number of Visits Requested:   1   Follow-up plan:   Return in  about 26 weeks (around 04/15/2019) for (VV), E/M, (MM) to evaluate Lyrica titration.      Interventional management options:  Considering:   Possible lumbar spinal cord stimulator trial Diagnosticbilateral lumbar facet nerve block Possible bilateral lumbar facetRFA Diagnostic left-sided L5-S1 transforaminal ESI Diagnostic left-sided L5 selective nerve root block Possible left-sided L5 DRG RFA Diagnostic left-sidedlumbarsympathetic nerve block Possible left-sided lumbar sympathetic RFA Diagnostic bilateral cervical facet nerve block Possible bilateral cervical facetRFA Diagnosticleft intra-articular shoulder injection Diagnosticleft suprascapular nerve block Diagnostic left suprascapularRFA  1 month Palliative PRN treatment(s):   Diagnostic left L5 TFESI #2 + right L4-5 LESI #2  Palliative bilateral lumbar facet blocks Palliative right-sided lumbar facet RFA #2 (last done 04/22/2018) Palliative left-sided lumbar facet RFA #2 (last done 03/13/2018)    Recent Visits Date Type Provider Dept  09/18/18 Office Visit Milinda Pointer, MD [facility]  09/04/18  Procedure visit Milinda Pointer, MD [facility]  08/25/18 Office Visit Milinda Pointer, MD [facility]  08/04/18 Office Visit Milinda Pointer, MD [facility]  Showing recent visits within past 90 days and meeting all other requirements   Today's Visits Date Type Provider Dept  10/15/18 Office Visit Milinda Pointer, MD [facility]  Showing today's visits and meeting all other requirements   Future Appointments No visits were found meeting these conditions.  Showing future appointments within next 90 days and meeting all other requirements   I discussed the assessment and treatment plan with the patient. The patient was provided an opportunity to ask questions and all were answered. The patient agreed with the plan and demonstrated an understanding of the instructions.  Patient advised to call back or seek an in-person evaluation if the symptoms or condition worsens.  Total duration of non-face-to-face encounter: 15 minutes.  Note by: Gaspar Cola, MD Date: 10/15/2018; Time: 3:01 PM  Note: This dictation was prepared with Dragon dictation. Any transcriptional errors that may result from this process are unintentional.  Disclaimer:  * Given the special circumstances of the COVID-19 pandemic, the federal government has announced that the Office for Civil Rights (OCR) will exercise its enforcement discretion and will not impose penalties on physicians using telehealth in the event of noncompliance with regulatory requirements under the [place] (HIPAA) in connection with the good faith provision of telehealth during the XX123456 national public health emergency. ([place])

## 2018-10-16 ENCOUNTER — Telehealth: Payer: Self-pay | Admitting: *Deleted

## 2018-10-22 ENCOUNTER — Telehealth: Payer: Self-pay | Admitting: *Deleted

## 2018-10-22 ENCOUNTER — Encounter: Payer: Medicare HMO | Admitting: Pain Medicine

## 2018-11-03 ENCOUNTER — Telehealth: Payer: Self-pay | Admitting: *Deleted

## 2018-11-03 NOTE — Telephone Encounter (Signed)
Please schedule a virtual appointment to discuss setting up a SCS trial with Dr Dossie Arbour as soon as available.

## 2018-11-05 NOTE — Telephone Encounter (Signed)
Scheduled patient for 11-17-18 8am

## 2018-11-07 ENCOUNTER — Other Ambulatory Visit: Payer: Self-pay | Admitting: Psychiatry

## 2018-11-07 DIAGNOSIS — F4310 Post-traumatic stress disorder, unspecified: Secondary | ICD-10-CM

## 2018-11-07 DIAGNOSIS — F410 Panic disorder [episodic paroxysmal anxiety] without agoraphobia: Secondary | ICD-10-CM

## 2018-11-07 DIAGNOSIS — F3160 Bipolar disorder, current episode mixed, unspecified: Secondary | ICD-10-CM

## 2018-11-10 ENCOUNTER — Encounter: Payer: Self-pay | Admitting: Surgery

## 2018-11-10 ENCOUNTER — Telehealth: Payer: Self-pay | Admitting: *Deleted

## 2018-11-10 ENCOUNTER — Other Ambulatory Visit: Payer: Self-pay

## 2018-11-10 ENCOUNTER — Ambulatory Visit (INDEPENDENT_AMBULATORY_CARE_PROVIDER_SITE_OTHER): Payer: Medicare HMO | Admitting: Surgery

## 2018-11-10 VITALS — BP 173/70 | HR 91 | Temp 97.5°F | Resp 14 | Ht 64.0 in | Wt 236.2 lb

## 2018-11-10 DIAGNOSIS — K432 Incisional hernia without obstruction or gangrene: Secondary | ICD-10-CM | POA: Diagnosis not present

## 2018-11-10 NOTE — Telephone Encounter (Signed)
Patient called and was seen earlier today and wanted to know why she is being sent back to [facility]. Please call and advise

## 2018-11-10 NOTE — Telephone Encounter (Signed)
Patient would like a copy of her notes from today for her records stating why she needs to see Dr Sharen Counter at [facility]. I will mail these to her home and call her once this is completed.

## 2018-11-10 NOTE — Patient Instructions (Addendum)
May use the large Band-Aid to the open area.  We will contact Dr Anderson [place] office about your referral to Dr Cheryln Manly at [facility].

## 2018-11-11 ENCOUNTER — Encounter: Payer: Self-pay | Admitting: Surgery

## 2018-11-12 ENCOUNTER — Encounter: Payer: Self-pay | Admitting: Surgery

## 2018-11-12 NOTE — Progress Notes (Signed)
Patient ID: Marie Clarke, female   DOB: 03-09-1970, 48 y.o.   MRN: [id number]  HPI Marie Clarke is a 47 y.o. female seen in consultation for recurrent ventral hernia.  She did have a significant history consistent of prolapse done and this was repair.  Prio hysterectomy, She did develop a hernia that Dr. Launa Flight at [facility] perform an open repair with onlay mesh last year.  Since then she has had chronic drainage from abdominal wound.  Dr. Launa Flight performed a debridement a few months ago.  She did have a CT scan of the abdomen and pelvis that have personally reviewed showing evidence of chronic inflammatory response within the abdominal wall subcutaneous tissue no definitive abscess.  There is evidence of a small recurrence of the hernia with a piece of small bowel.  No evidence of incarceration no evidence of obstruction.  She reports some chronic pain that is moderate intensity intermittent and sharp in nature.  She does have a significant history consistent with COPD, TIA, CHF, coronary artery disease, diabetes.  She does have some dyspnea on exertion.  She does not wear any oxygen currently.  She also had a CBC, CMP and INR were normal. Please note that I have personally review all extensive records and operative reports from [facility]. HPI  Past Medical History:  Diagnosis Date  . Anemia   . Anxiety   . Asthma   . Bipolar disorder ([place])   . CAD (coronary artery disease) unk  . CHF (congestive heart failure) ([place])   . COPD (chronic obstructive pulmonary disease) ([place])   . Depression unk  . Diabetes mellitus without complication ([place])   . Diabetes mellitus, type II ([place])   . Drug overdose   . GERD (gastroesophageal reflux disease)   . Headache   . Hyperlipidemia   . Hypertension   . Left leg pain 04/29/2014  . MI (myocardial infarction) ([place])   . Muscle ache 09/16/2014  . Osteoporosis   . Overactive bladder   . Pancreatitis unk  . Reflex sympathetic dystrophy   . Renal insufficiency   . Sleep apnea    pt reported on 2/6/7 she currently is not using CPAP  . Sleep apnea   . Stroke ([place])   . Thyroid disease   . TIA (transient ischemic attack) unk  . TIA (transient ischemic attack)     Past Surgical History:  Procedure Laterality Date  . ABDOMINAL HYSTERECTOMY    . HERNIA REPAIR  07/15/2017   [facility]  . prolapse rectum surgery N/A July 2016  . TONSILLECTOMY      Family History  Problem Relation Age of Onset  . Diabetes Mellitus II Mother   . CAD Mother   . Sleep apnea Mother   . Osteoarthritis Mother   . Osteoporosis Mother   . Anxiety disorder Mother   . Depression Mother   . Bipolar disorder Mother   . Bipolar disorder Father   . Hypertension Father   . Depression Father   . Anxiety disorder Father   . Post-traumatic stress disorder Sister     Social History Social History   Tobacco Use  . Smoking status: Former Smoker    Packs/day: 0.50    Types: Cigarettes  . Smokeless tobacco: Never Used  Substance Use Topics  . Alcohol use: No    Alcohol/week: 0.0 standard drinks  . Drug use: No    Allergies  Allergen Reactions  . Diazepam Hives and Nausea And Vomiting  . Levofloxacin Hives and Rash  1/31: would like to retry since not taking valium  . Metronidazole Hives    1/31: would like to retry since she is no longer taking valium  . Sulfa Antibiotics Hives  . Cephalexin Hives  . Ciprofloxacin Hives  . Ziprasidone Hcl Other (See Comments)    CONFUSED CONFUSED Other reaction(s): Other (See Comments) CONFUSED CONFUSED   . Penicillins Nausea And Vomiting    Has patient had a PCN reaction causing immediate rash, facial/tongue/throat swelling, SOB or lightheadedness with hypotension: No Has patient had a PCN reaction causing severe rash involving mucus membranes or skin necrosis: No Has patient had a PCN reaction that required hospitalization: No Has patient had a PCN reaction occurring within the last 10 years: No If all of the above answers are "NO", then  may proceed with Cephalosporin use.     Current Outpatient Medications  Medication Sig Dispense Refill  . acetaminophen (TYLENOL) 325 MG tablet Take 1 tablet (325 mg total) by mouth every 6 (six) hours as needed for mild pain (or Fever >/= 101).    Marland Kitchen aspirin EC 81 MG tablet Take 81 mg by mouth daily at 12 noon.    Marland Kitchen atorvastatin (LIPITOR) 40 MG tablet Take 40 mg by mouth at bedtime.     . busPIRone (BUSPAR) 10 MG tablet Take 2 tablets (20 mg total) by mouth 3 (three) times daily. 540 tablet 1  . diphenhydrAMINE (BENADRYL) 25 mg capsule TAKE 1 CAPSULE BY MOUTH TWICE DAILY AS NEEDED FOR ITCHING (Patient taking differently: Take 25 mg by mouth 2 (two) times daily as needed for itching. ) 60 capsule 1  . Dulaglutide 0.75 MG/0.5ML SOPN Inject 0.75 mg into the skin every Saturday.     Marland Kitchen KLOR-CON M20 20 MEQ tablet Take 20 mEq by mouth 2 (two) times daily.     Marland Kitchen levocetirizine (XYZAL) 5 MG tablet Take 5 mg by mouth daily.     Marland Kitchen levothyroxine (SYNTHROID) 25 MCG tablet Take 50 mcg by mouth daily. (271mcg total daily)    . levothyroxine (SYNTHROID, LEVOTHROID) 200 MCG tablet Take 200 mcg by mouth daily. (254mcg total daily)    . Magnesium Oxide 500 MG CAPS Take 1 capsule (500 mg total) by mouth 2 (two) times daily at 8 am and 10 pm. 60 capsule 5  . meloxicam (MOBIC) 15 MG tablet Take 1 tablet (15 mg total) by mouth daily with breakfast. Max: 1/day 30 tablet 5  . methocarbamol (ROBAXIN) 750 MG tablet Take 1 tablet (750 mg total) by mouth every 8 (eight) hours as needed for muscle spasms. 90 tablet 5  . montelukast (SINGULAIR) 10 MG tablet Take 10 mg by mouth daily.     Marland Kitchen MYRBETRIQ 50 MG TB24 tablet Take 50 mg by mouth daily.     . nitroGLYCERIN (NITROSTAT) 0.4 MG SL tablet Place 0.4 mg under the tongue every 5 (five) minutes as needed for chest pain.     Marland Kitchen omeprazole (PRILOSEC) 20 MG capsule Take 1 capsule (20 mg total) by mouth at bedtime. 30 capsule 5  . ondansetron (ZOFRAN-ODT) 8 MG disintegrating  tablet Take 8 mg by mouth every 8 (eight) hours as needed for nausea or vomiting.    Derrill Memo ON 11/19/2018] pregabalin (LYRICA) 150 MG capsule Take 1 capsule (150 mg total) by mouth 2 (two) times daily. 290 capsule 0  . propranolol (INDERAL) 20 MG tablet TAKE 1 TABLET BY MOUTH 3 TIMES A DAY AS NEEDED FOR ANXIETY (Patient taking differently: Take 20 mg  by mouth 3 (three) times daily as needed (anxiety). ) 90 tablet 1  . QUEtiapine (SEROQUEL) 100 MG tablet Take 1-1.5 tablets (100-150 mg total) by mouth at bedtime as needed. 135 tablet 0  . rizatriptan (MAXALT) 10 MG tablet Take 1 tablet (10 mg total) by mouth as needed. (Patient taking differently: Take 10 mg by mouth as needed for migraine. ) 10 tablet 2  . torsemide (DEMADEX) 20 MG tablet Take 40 mg by mouth 2 (two) times daily.     Derrill Memo ON 12/17/2018] traMADol (ULTRAM) 50 MG tablet Take 1-2 tablets (50-100 mg total) by mouth every 6 (six) hours. 240 tablet 3  . TRELEGY ELLIPTA 100-62.5-25 MCG/INH AEPB Inhale 1 puff into the lungs daily.     . VENTOLIN HFA 108 (90 Base) MCG/ACT inhaler Inhale 1-2 puffs into the lungs every 6 (six) hours as needed for wheezing or shortness of breath.     . divalproex (DEPAKOTE) 250 MG DR tablet TAKE 1 TABLET BY MOUTH 3 TIMES DAILY 90 tablet 1   No current facility-administered medications for this visit.      Review of Systems Full ROS  was asked and was negative except for the information on the HPI  Physical Exam Blood pressure (!) 173/70, pulse 91, temperature (!) 97.5 F (36.4 C), resp. rate 14, height 5\' 4"  (1.626 m), weight 236 lb 3.2 oz (107.1 kg), SpO2 97 %. CONSTITUTIONAL: NAD, chronically ill EYES: Pupils are equal, round, and reactive to light, Sclera are non-icteric. EARS, NOSE, MOUTH AND THROAT: The oropharynx is clear. The oral mucosa is pink and moist. Hearing is intact to voice. LYMPH NODES:  Lymph nodes in the neck are normal. RESPIRATORY:  Lungs are clear. There is normal respiratory  effort, with equal breath sounds bilaterally, and without pathologic use of accessory muscles. CARDIOVASCULAR: Heart is regular without murmurs, gallops, or rubs. GI: The abdomen is  soft, there is a midline wound measuring 4 x 1 cm with good granulation tissue.  Given her body habitus it is difficult to assess the size of the defect.  She does not exhibit any peritoneal signs there is no evidence of abscess or necrotizing infection  GU: Rectal deferred.   MUSCULOSKELETAL: Normal muscle strength and tone. No cyanosis or edema.   SKIN: Turgor is good and there are no pathologic skin lesions or ulcers. NEUROLOGIC: Motor and sensation is grossly normal. Cranial nerves are grossly intact. PSYCH:  Oriented to person, place and time. Affect is normal.  Data Reviewed  I have personally reviewed the patient's imaging, laboratory findings and medical records.    Assessment/Plan 48 year old female with a complex abdominal history consistent of recurrent ventral hernias with  Prior repair with onlay mesh by Dr. Launa Flight at [facility],  required debridements and she currently has an open wound with chronic drainage.  One may raise the possibility of a chronic mesh infection.  She does have a hernia recurrence on the CT.  She does have a significant history of COPD, CAD, CHF and failure to thrive.  Given all these reasons I do think that she is better served at a tertiary center, I have recommended Dr. Sharen Counter .  I am concerned about a potential chronic mesh infection that may require excision and abdominal wall reconstruction.  Although I am not certain of this diagnosis I cannot completely exclude it.  I have provided my thought process to the patient and she understands.  We will arrange referral to Dr. Sharen Counter at [facility]. No  need for emergent surgical intervention at this time  Please note I spent 60 minutes in this encounter with greater than 50% spent in coordination/counseling of her care Caroleen Hamman, MD [place]  Surgeon 11/12/2018, 9:44 AM

## 2018-11-13 ENCOUNTER — Encounter: Payer: Self-pay | Admitting: Pain Medicine

## 2018-11-13 NOTE — Telephone Encounter (Signed)
Patient notified that records have been mailed to her home address.

## 2018-11-14 ENCOUNTER — Other Ambulatory Visit: Payer: Self-pay | Admitting: Psychiatry

## 2018-11-14 DIAGNOSIS — F410 Panic disorder [episodic paroxysmal anxiety] without agoraphobia: Secondary | ICD-10-CM

## 2018-11-15 NOTE — Progress Notes (Signed)
Pain Management Virtual Encounter Note - Virtual Visit via Telephone Telehealth (real-time audio visits between healthcare provider and patient).   Patient's Phone No. & Preferred Pharmacy:  [phone] (home); [phone] (mobile); (Preferred) [phone] [email or web address]  [name], [name] [address] [place] [address] Phone: [phone] Fax: [phone]    Pre-screening note:  Our staff contacted Marie Clarke and offered her an "in person", "face-to-face" appointment versus a telephone encounter. She indicated preferring the telephone encounter, at this time.   Reason for Virtual Visit: COVID-19*  Social distancing based on CDC and AMA recommendations.   I contacted [name] on 11/17/2018 via telephone.      I clearly identified myself as Gaspar Cola, MD. I verified that I was speaking with the correct person using two identifiers (Name: Marie Clarke, and date of birth: [birth year]).  Advanced Informed Consent I sought verbal advanced consent from [facility] for virtual visit interactions. I informed Marie Clarke of possible security and privacy concerns, risks, and limitations associated with providing "not-in-person" medical evaluation and management services. I also informed Marie Clarke of the availability of "in-person" appointments. Finally, I informed her that there would be a charge for the virtual visit and that she could be  personally, fully or partially, financially responsible for it. Marie Clarke expressed understanding and agreed to proceed.   Historic Elements   Marie Clarke is a 48 y.o. year old, female patient evaluated today after her last encounter by our practice on 11/03/2018. Marie Clarke  has a past medical history of Anemia, Anxiety, Asthma, Bipolar disorder ([place]), CAD (coronary artery disease) ([place]), CHF (congestive heart failure) ([place]), COPD (chronic obstructive pulmonary disease) ([place]), Depression ([place]), Diabetes  mellitus without complication ([place]), Diabetes mellitus, type II ([place]), Drug overdose, GERD (gastroesophageal reflux disease), Headache, Hyperlipidemia, Hypertension, Left leg pain (04/29/2014), MI (myocardial infarction) ([place]), Muscle ache (09/16/2014), Osteoporosis, Overactive bladder, Pancreatitis ([place]), Reflex sympathetic dystrophy, Renal insufficiency, Sleep apnea, Sleep apnea, Stroke ([place]), Thyroid disease, TIA (transient ischemic attack) ([place]), and TIA (transient ischemic attack). She also  has a past surgical history that includes prolapse rectum surgery (N/A, July 2016); Tonsillectomy; Abdominal hysterectomy; and Hernia repair (07/15/2017). Marie Clarke has a current medication list which includes the following prescription(s): aspirin ec, atorvastatin, buspirone, diphenhydramine, divalproex, dulaglutide, klor-con m20, levocetirizine, levothyroxine, levothyroxine, magnesium oxide, meloxicam, methocarbamol, montelukast, myrbetriq, nitroglycerin, omeprazole, ondansetron, pregabalin, quetiapine, rizatriptan, torsemide, tramadol, trelegy ellipta, ventolin hfa, and propranolol, and the following Facility-Administered Medications: sodium chloride, acetaminophen **OR** acetaminophen, docusate sodium, heparin, insulin aspart, insulin aspart, mirabegron er, nitrofurantoin (macrocrystal-monohydrate), and ondansetron **OR** ondansetron. She  reports that she has quit smoking. Her smoking use included cigarettes. She smoked 0.50 packs per day. She has never used smokeless tobacco. She reports that she does not drink alcohol or use drugs. Marie Clarke is allergic to diazepam; levofloxacin; metronidazole; sulfa antibiotics; cephalexin; ciprofloxacin; ziprasidone hcl; and penicillins.   HPI  Today, she is being contacted for evaluation and to discuss the possibility of spinal cord stimulator.  However, apparently the medical psychology evaluation was denied.  Without medical psychology evaluation there will be no trial.  According  to my review of the medical chart, the patient went to the emergency room yesterday and was admitted due to a tentative diagnosis of acute renal failure with hypotension and a UTI.  The patient was contacted today and found to have being admitted to the hospital due to the acute renal failure.  She indicates that she did have a  chance to see the medical psychologist in [place].  Unfortunately, I do not have the results at this time.  Because of the patient's acute renal failure, she was given the following instructions:  Medication instructions to follow until kidney function recovers. You were recently found to have acute renal failure.  Until this resolves, make the following changes in your medications: 1. Tramadol (Ultram): Take no more than 1 tablet (50 mg) every 6 hours. 2. Magnesium: Take no more than 1 tablet (500 mg) per day. 3. Meloxicam (Mobic): Completely discontinue the use of this medication until further notice.  Do not take any NSAIDs (Motrin/ibuprofen/naproxen/Naprosyn/Advil/etc.). 4. Methocarbamol (Robaxin): Take no more than 1 tablet (750 mg) per day. 5. Pregabalin (Lyrica): Decrease dose to a maximum of 150 mg at bedtime.   Consult your kidney Doctor to confirm recommendations. Continue with this regimen until kidney function returns to normal levels (eGFR is > 60)  The patient indicated having understood.  She was informed that these instructions were available to her at her "after visit summary" for today's visit.  Pharmacotherapy Assessment  Analgesic: Tramadol 50 mg, 1-2 tab q 6 hrs (200-400 mg/day of tramadol) MME/day:20-20m/day.   Monitoring: Pharmacotherapy: No side-effects or adverse reactions reported. [place] PMP: PDMP reviewed during this encounter.       Compliance: No problems identified. Effectiveness: Clinically acceptable. Plan: Refer to "POC".  UDS:  Summary  Date Value Ref Range Status  01/22/2018 FINAL  Final    Comment:     ==================================================================== TOXASSURE SELECT 13 (MW) ==================================================================== Test                             Result       Flag       Units Drug Present and Declared for Prescription Verification   Tramadol                       2742         EXPECTED   ng/mg creat   O-Desmethyltramadol            5630         EXPECTED   ng/mg creat   N-Desmethyltramadol            2591         EXPECTED   ng/mg creat    Source of tramadol is a prescription medication.    O-desmethyltramadol and N-desmethyltramadol are expected    metabolites of tramadol. ==================================================================== Test                      Result    Flag   Units      Ref Range   Creatinine              66               mg/dL      >=20 ==================================================================== Declared Medications:  The flagging and interpretation on this report are based on the  following declared medications.  Unexpected results may arise from  inaccuracies in the declared medications.  **Note: The testing scope of this panel includes these medications:  Tramadol  **Note: The testing scope of this panel does not include following  reported medications:  Acetaminophen  Aspirin (Aspirin 81)  Atorvastatin  Atropine (Lomotil)  Azelastine  Bumetanide  Buspirone  Diphenoxylate (Lomotil)  Fluticasone (Trelegy Ellipta)  Gabapentin  Hydroxyzine (Vistaril)  Lamotrigine  Levocetirizine (Xyzal)  Levothyroxine  Magnesium Oxide  Metformin  Methocarbamol  Mirabegron (Myrbetriq)  Montelukast  Nicotine  Omega-3 Fatty Acids  Ondansetron  Potassium  Potassium (Klor-Con)  Promethazine  Quetiapine  Rizatriptan  Umeclidinium (Trelegy Ellipta)  Vilanterol (Trelegy Ellipta) ==================================================================== For clinical consultation, please call [phone]. ====================================================================    Laboratory Chemistry Profile (12 mo)  Renal: 11/17/2018: BUN 49; Creatinine, Ser 2.36  Lab Results  Component Value Date   GFRAA 28 (L) 11/17/2018   GFRNONAA 24 (L) 11/17/2018   Hepatic: 11/17/2018: Albumin 3.4 Lab Results  Component Value Date   AST 20 11/17/2018   ALT 15 11/17/2018   Other: No results found for requested labs within last 8760 hours. Note: Above Lab results reviewed.  Imaging  Last 90 days:  Ct Head Wo Contrast  Result Date: 09/29/2018 CLINICAL DATA:  Altered level of consciousness EXAM: CT HEAD WITHOUT CONTRAST TECHNIQUE: Contiguous axial images were obtained from the base of the skull through the vertex without intravenous contrast. COMPARISON:  Head CT and brain MRI September 20, 2016 FINDINGS: Brain: The ventricles are normal in size and configuration. There is no intracranial mass, hemorrhage, extra-axial fluid collection, or midline shift. The brain parenchyma appears unremarkable. No evident acute infarct. Vascular: There is no hyperdense vessel. There is no appreciable vascular calcification. Skull: Bony calvarium appears intact. Sinuses/Orbits: There is slight mucosal thickening in the inferior right maxillary antrum. There is slight mucosal thickening in several ethmoid air cells. Other visualized paranasal sinuses are clear. Orbits appear symmetric bilaterally. Other: Mastoid air cells are clear. IMPRESSION: Slight paranasal sinus disease.  Study otherwise unremarkable. Electronically Signed   By: Lowella Grip III M.D.   On: 09/29/2018 09:42   Ct Abdomen Pelvis W Contrast  Result Date: 09/29/2018 CLINICAL DATA:  Acute onset abdominal pain, nausea and vomiting, and diarrhea last night. EXAM: CT ABDOMEN AND PELVIS WITH CONTRAST TECHNIQUE: Multidetector CT imaging of the abdomen and pelvis was performed using the standard protocol following bolus administration of intravenous  contrast. CONTRAST:  167m OMNIPAQUE IOHEXOL 300 MG/ML  SOLN COMPARISON:  09/17/2018 and 09/06/2017 FINDINGS: Lower Chest: No acute findings. Hepatobiliary: No hepatic masses identified. Gallbladder is unremarkable. No evidence of biliary ductal dilatation. Pancreas:  No mass or inflammatory changes. Spleen: Within normal limits in size and appearance. Adrenals/Urinary Tract: Stable 2 cm left adrenal mass, consistent with benign adenoma. No evidence of renal masses or hydronephrosis. Unremarkable unopacified urinary bladder. Stomach/Bowel: No evidence of obstruction, inflammatory process or abnormal fluid collections. Anastomotic staples noted in the sigmoid colon. Normal appendix visualized. Vascular/Lymphatic: No pathologically enlarged lymph nodes. No abdominal aortic aneurysm. Reproductive: Prior hysterectomy noted. Adnexal regions are unremarkable in appearance. Other: Stable tiny ventral abdominal wall incisional hernia containing a collapsed loop of small bowel. Musculoskeletal:  No suspicious bone lesions identified. IMPRESSION: 1. No acute findings within the abdomen or pelvis. 2. Stable tiny ventral abdominal wall incisional hernia. 3. Stable small benign left adrenal adenoma. Electronically Signed   By: JMarlaine HindM.D.   On: 09/29/2018 09:59   Ct Abdomen Pelvis W Contrast  Result Date: 09/17/2018 CLINICAL DATA:  Acute generalized abdominal pain. EXAM: CT ABDOMEN AND PELVIS WITH CONTRAST TECHNIQUE: Multidetector CT imaging of the abdomen and pelvis was performed using the standard protocol following bolus administration of intravenous contrast. CONTRAST:  1033mOMNIPAQUE IOHEXOL 300 MG/ML  SOLN COMPARISON:  September 06, 2017 FINDINGS: Lower chest: No acute abnormality. Hepatobiliary: No focal liver abnormality is seen. No gallstones, gallbladder wall thickening, or  biliary dilatation. Pancreas: Unremarkable. No pancreatic ductal dilatation or surrounding inflammatory changes. Spleen: Normal in size  without focal abnormality. Adrenals/Urinary Tract: 2 cm intermediate density left adrenal mass, measuring 44 Hounsfield units. Stable in size from July 2019 and March 2018. Normal appearance of the bilateral kidneys and urinary bladder. Stomach/Bowel: Stomach is within normal limits. Appendix appears normal. No evidence of bowel wall thickening, distention, or inflammatory changes. Postsurgical changes of the sigmoid colon without complicating features. Vascular/Lymphatic: No significant vascular findings are present. No enlarged abdominal or pelvic lymph nodes. Reproductive: Status post hysterectomy. No adnexal masses. Other: For current tiny periumbilical anterior abdominal wall hernia which contains a short segment of small bowel. The segment of bowel within the hernial sac has normal appearance, however given the narrow neck of the hernia, there is potential for incarceration. Musculoskeletal: No acute or significant osseous findings. IMPRESSION: 1. Tiny periumbilical anterior abdominal wall hernia which contains a short segment of small bowel. The segment of bowel within the hernial sac has normal appearance, however given the narrow neck of the hernia, there is potential for incarceration. 2. Otherwise no evidence of acute abnormalities within the abdomen or pelvis. 3. Stable 2 cm left adrenal mass. Electronically Signed   By: Fidela Salisbury M.D.   On: 09/17/2018 13:14   US Carotid Bilateral  Result Date: 09/30/2018 CLINICAL DATA:  48 year old female with syncope EXAM: BILATERAL CAROTID DUPLEX ULTRASOUND TECHNIQUE: Pearline Cables scale imaging, color Doppler and duplex ultrasound were performed of bilateral carotid and vertebral arteries in the neck. COMPARISON:  None. FINDINGS: Criteria: Quantification of carotid stenosis is based on velocity parameters that correlate the residual internal carotid diameter with NASCET-based stenosis levels, using the diameter of the distal internal carotid lumen as the  denominator for stenosis measurement. The following velocity measurements were obtained: RIGHT ICA: 113/26 cm/sec CCA: 35/36 cm/sec SYSTOLIC ICA/CCA RATIO:  1.3 ECA:  136 cm/sec LEFT ICA: 80/29 cm/sec CCA: 14/43 cm/sec SYSTOLIC ICA/CCA RATIO:  0.9 ECA:  97 cm/sec RIGHT CAROTID ARTERY: No significant atherosclerotic plaque or evidence of stenosis. RIGHT VERTEBRAL ARTERY:  Patent with normal antegrade flow. LEFT CAROTID ARTERY: No significant atherosclerotic plaque or stenosis. LEFT VERTEBRAL ARTERY:  Patent with normal antegrade flow. IMPRESSION: 1. No significant atherosclerotic plaque or internal carotid artery stenosis. 2. Vertebral arteries are patent with normal antegrade flow. Signed, Criselda Peaches, MD, [facility] Electronically Signed   By: Jacqulynn Cadet M.D.   On: 09/30/2018 12:36   [facility] C-arm 1-60 Min No Report  Result Date: 09/04/2018 Fluoro was used, but no Radiologist interpretation will be provided. Please refer to "NOTES" tab for provider progress note.   Assessment  There were no encounter diagnoses.  Plan of Care  I have discontinued [facility] acetaminophen. I am also having her maintain her atorvastatin, levothyroxine, montelukast, aspirin EC, Klor-Con M20, Trelegy Ellipta, Myrbetriq, Ventolin HFA, rizatriptan, levocetirizine, nitroGLYCERIN, torsemide, busPIRone, Dulaglutide, diphenhydrAMINE, levothyroxine, QUEtiapine, ondansetron, methocarbamol, Magnesium Oxide, omeprazole, meloxicam, traMADol, pregabalin, and divalproex.  Pharmacotherapy (Medications Ordered): No orders of the defined types were placed in this encounter.  Orders:  No orders of the defined types were placed in this encounter.  Follow-up plan:   Return in about 5 months (around 04/22/2019) for (VV), (MM).  Spinal cord stimulator evaluation put on hold until the patient can recover from this acute renal failure, apparently  triggered by a urinary tract infection.  The patient was given the following instructions regarding her medications: Medication instructions  to follow until kidney function recovers. You were recently found to have acute renal failure.  Until this resolves, make the following changes in your medications: 6. Tramadol (Ultram): Take no more than 1 tablet (50 mg) every 6 hours. 7. Magnesium: Take no more than 1 tablet (500 mg) per day. 8. Meloxicam (Mobic): Completely discontinue the use of this medication until further notice.  Do not take any NSAIDs (Motrin/ibuprofen/naproxen/Naprosyn/Advil/etc.). 9. Methocarbamol (Robaxin): Take no more than 1 tablet (750 mg) per day. 10. Pregabalin (Lyrica): Decrease dose to a maximum of 150 mg at bedtime.   Consult your kidney Doctor to confirm recommendations. Continue with this regimen until kidney function returns to normal levels (eGFR is > 60)     Interventional management options:  Considering:   Possible lumbar spinal cord stimulator trial Diagnosticbilateral lumbar facet nerve block Possible bilateral lumbar facetRFA Diagnostic left-sided L5-S1 transforaminal ESI Diagnostic left-sided L5 selective nerve root block Possible left-sided L5 DRG RFA Diagnostic left-sidedlumbarsympathetic nerve block Possible left-sided lumbar sympathetic RFA Diagnostic bilateral cervical facet nerve block Possible bilateral cervical facetRFA Diagnosticleft intra-articular shoulder injection Diagnosticleft suprascapular nerve block Diagnostic left suprascapularRFA   Palliative PRN treatment(s):   Diagnostic left L5 TFESI #2 + right L4-5 LESI #2  Palliative bilateral lumbar facet blocks Palliative right-sided lumbar facet RFA #2 (last done 04/22/2018) Palliative left-sided lumbar facet RFA #2 (last done 03/13/2018)    Recent Visits Date Type Provider Dept  10/15/18 Office Visit Milinda Pointer, MD [facility]  09/18/18 Office  Visit Milinda Pointer, MD [facility]  09/04/18 Procedure visit Milinda Pointer, MD [facility]  08/25/18 Office Visit Milinda Pointer, MD [facility]  Showing recent visits within past 90 days and meeting all other requirements   Today's Visits Date Type Provider Dept  11/17/18 Office Visit Milinda Pointer, MD [facility]  Showing today's visits and meeting all other requirements   Future Appointments No visits were found meeting these conditions.  Showing future appointments within next 90 days and meeting all other requirements   I discussed the assessment and treatment plan with the patient. The patient was provided an opportunity to ask questions and all were answered. The patient agreed with the plan and demonstrated an understanding of the instructions.  Patient advised to call back or seek an in-person evaluation if the symptoms or condition worsens.  Total duration of non-face-to-face encounter: 14 minutes.  Note by: Gaspar Cola, MD Date: 11/17/2018; Time: 8:41 AM  Note: This dictation was prepared with Dragon dictation. Any transcriptional errors that may result from this process are unintentional.  Disclaimer:  * Given the special circumstances of the COVID-19 pandemic, the federal government has announced that the Office for Civil Rights (OCR) will exercise its enforcement discretion and will not impose penalties on physicians using telehealth in the event of noncompliance with regulatory requirements under the [place] (HIPAA) in connection with the good faith provision of telehealth during the DJMEQ-68 national public health emergency. ([place])

## 2018-11-16 ENCOUNTER — Encounter: Payer: Self-pay | Admitting: Emergency Medicine

## 2018-11-16 ENCOUNTER — Other Ambulatory Visit: Payer: Self-pay

## 2018-11-16 ENCOUNTER — Inpatient Hospital Stay
Admission: EM | Admit: 2018-11-16 | Discharge: 2018-11-18 | DRG: 683 | Disposition: A | Discharge status: Home / Self Care | Payer: Medicare Other | Attending: Internal Medicine | Admitting: Internal Medicine

## 2018-11-16 DIAGNOSIS — J449 Chronic obstructive pulmonary disease, unspecified: Secondary | ICD-10-CM | POA: Diagnosis not present

## 2018-11-16 DIAGNOSIS — I2510 Atherosclerotic heart disease of native coronary artery without angina pectoris: Secondary | ICD-10-CM | POA: Diagnosis present

## 2018-11-16 DIAGNOSIS — Z79890 Hormone replacement therapy (postmenopausal): Secondary | ICD-10-CM

## 2018-11-16 DIAGNOSIS — E785 Hyperlipidemia, unspecified: Secondary | ICD-10-CM | POA: Diagnosis not present

## 2018-11-16 DIAGNOSIS — Z20828 Contact with and (suspected) exposure to other viral communicable diseases: Secondary | ICD-10-CM | POA: Diagnosis present

## 2018-11-16 DIAGNOSIS — N390 Urinary tract infection, site not specified: Secondary | ICD-10-CM

## 2018-11-16 DIAGNOSIS — Z915 Personal history of self-harm: Secondary | ICD-10-CM

## 2018-11-16 DIAGNOSIS — Z8262 Family history of osteoporosis: Secondary | ICD-10-CM

## 2018-11-16 DIAGNOSIS — E039 Hypothyroidism, unspecified: Secondary | ICD-10-CM | POA: Diagnosis present

## 2018-11-16 DIAGNOSIS — F419 Anxiety disorder, unspecified: Secondary | ICD-10-CM | POA: Diagnosis not present

## 2018-11-16 DIAGNOSIS — D631 Anemia in chronic kidney disease: Secondary | ICD-10-CM | POA: Diagnosis not present

## 2018-11-16 DIAGNOSIS — Z8673 Personal history of transient ischemic attack (TIA), and cerebral infarction without residual deficits: Secondary | ICD-10-CM | POA: Diagnosis not present

## 2018-11-16 DIAGNOSIS — I252 Old myocardial infarction: Secondary | ICD-10-CM | POA: Diagnosis not present

## 2018-11-16 DIAGNOSIS — Z880 Allergy status to penicillin: Secondary | ICD-10-CM

## 2018-11-16 DIAGNOSIS — K219 Gastro-esophageal reflux disease without esophagitis: Secondary | ICD-10-CM | POA: Diagnosis not present

## 2018-11-16 DIAGNOSIS — N183 Chronic kidney disease, stage 3 unspecified: Secondary | ICD-10-CM | POA: Diagnosis not present

## 2018-11-16 DIAGNOSIS — I5032 Chronic diastolic (congestive) heart failure: Secondary | ICD-10-CM | POA: Diagnosis present

## 2018-11-16 DIAGNOSIS — M810 Age-related osteoporosis without current pathological fracture: Secondary | ICD-10-CM | POA: Diagnosis present

## 2018-11-16 DIAGNOSIS — F319 Bipolar disorder, unspecified: Secondary | ICD-10-CM | POA: Diagnosis present

## 2018-11-16 DIAGNOSIS — E1122 Type 2 diabetes mellitus with diabetic chronic kidney disease: Secondary | ICD-10-CM | POA: Diagnosis not present

## 2018-11-16 DIAGNOSIS — Z87891 Personal history of nicotine dependence: Secondary | ICD-10-CM

## 2018-11-16 DIAGNOSIS — N179 Acute kidney failure, unspecified: Principal | ICD-10-CM | POA: Diagnosis present

## 2018-11-16 DIAGNOSIS — Z881 Allergy status to other antibiotic agents status: Secondary | ICD-10-CM

## 2018-11-16 DIAGNOSIS — Z791 Long term (current) use of non-steroidal anti-inflammatories (NSAID): Secondary | ICD-10-CM

## 2018-11-16 DIAGNOSIS — F4310 Post-traumatic stress disorder, unspecified: Secondary | ICD-10-CM | POA: Diagnosis present

## 2018-11-16 DIAGNOSIS — G4730 Sleep apnea, unspecified: Secondary | ICD-10-CM | POA: Diagnosis not present

## 2018-11-16 DIAGNOSIS — Z90710 Acquired absence of both cervix and uterus: Secondary | ICD-10-CM

## 2018-11-16 DIAGNOSIS — Z8249 Family history of ischemic heart disease and other diseases of the circulatory system: Secondary | ICD-10-CM

## 2018-11-16 DIAGNOSIS — Z882 Allergy status to sulfonamides status: Secondary | ICD-10-CM

## 2018-11-16 DIAGNOSIS — E876 Hypokalemia: Secondary | ICD-10-CM | POA: Diagnosis present

## 2018-11-16 DIAGNOSIS — Z7982 Long term (current) use of aspirin: Secondary | ICD-10-CM

## 2018-11-16 DIAGNOSIS — Z79899 Other long term (current) drug therapy: Secondary | ICD-10-CM

## 2018-11-16 DIAGNOSIS — Z888 Allergy status to other drugs, medicaments and biological substances status: Secondary | ICD-10-CM

## 2018-11-16 DIAGNOSIS — Z833 Family history of diabetes mellitus: Secondary | ICD-10-CM

## 2018-11-16 DIAGNOSIS — I959 Hypotension, unspecified: Secondary | ICD-10-CM | POA: Diagnosis present

## 2018-11-16 DIAGNOSIS — I130 Hypertensive heart and chronic kidney disease with heart failure and stage 1 through stage 4 chronic kidney disease, or unspecified chronic kidney disease: Secondary | ICD-10-CM | POA: Diagnosis present

## 2018-11-16 DIAGNOSIS — Z6841 Body Mass Index (BMI) 40.0 and over, adult: Secondary | ICD-10-CM | POA: Diagnosis not present

## 2018-11-16 DIAGNOSIS — Z818 Family history of other mental and behavioral disorders: Secondary | ICD-10-CM

## 2018-11-16 LAB — URINALYSIS, COMPLETE (UACMP) WITH MICROSCOPIC
Bacteria, UA: NONE SEEN
Bilirubin Urine: NEGATIVE
Glucose, UA: NEGATIVE mg/dL
Hgb urine dipstick: NEGATIVE
Ketones, ur: NEGATIVE mg/dL
Nitrite: NEGATIVE
Protein, ur: NEGATIVE mg/dL
Specific Gravity, Urine: 1.015 (ref 1.005–1.030)
pH: 5 (ref 5.0–8.0)

## 2018-11-16 MED ORDER — SODIUM CHLORIDE 0.9 % IV BOLUS
500.0000 mL | Freq: Once | INTRAVENOUS | Status: AC
  Administered 2018-11-16: 500 mL via INTRAVENOUS

## 2018-11-16 NOTE — ED Notes (Signed)
IV team here for consult

## 2018-11-16 NOTE — ED Notes (Signed)
Lab here for venipuncture assist.  

## 2018-11-16 NOTE — ED Triage Notes (Signed)
Pt arrives POV and ambulatory to triage with c/o low BP "for a few hours". Pt states that Dr Dahlia Byes told her that her hernia mesh was infected and she has an appointment to schedule surgery on 10/26. Pt states that she has also had a low grade fever.

## 2018-11-16 NOTE — ED Notes (Signed)
IV team unable to obtain blood sample at this time. Marie Clarke in lab notified of need for phlebotomy assist.

## 2018-11-17 ENCOUNTER — Ambulatory Visit: Payer: Medicare HMO | Attending: Pain Medicine | Admitting: Pain Medicine

## 2018-11-17 ENCOUNTER — Inpatient Hospital Stay: Payer: Medicare Other

## 2018-11-17 ENCOUNTER — Telehealth: Payer: Self-pay | Admitting: Psychiatry

## 2018-11-17 ENCOUNTER — Ambulatory Visit: Payer: Medicare HMO | Admitting: Psychiatry

## 2018-11-17 DIAGNOSIS — K219 Gastro-esophageal reflux disease without esophagitis: Secondary | ICD-10-CM | POA: Diagnosis not present

## 2018-11-17 DIAGNOSIS — N183 Chronic kidney disease, stage 3 unspecified: Secondary | ICD-10-CM | POA: Diagnosis not present

## 2018-11-17 DIAGNOSIS — G894 Chronic pain syndrome: Secondary | ICD-10-CM | POA: Diagnosis not present

## 2018-11-17 DIAGNOSIS — F319 Bipolar disorder, unspecified: Secondary | ICD-10-CM | POA: Diagnosis not present

## 2018-11-17 DIAGNOSIS — I130 Hypertensive heart and chronic kidney disease with heart failure and stage 1 through stage 4 chronic kidney disease, or unspecified chronic kidney disease: Secondary | ICD-10-CM | POA: Diagnosis not present

## 2018-11-17 DIAGNOSIS — J449 Chronic obstructive pulmonary disease, unspecified: Secondary | ICD-10-CM | POA: Diagnosis not present

## 2018-11-17 DIAGNOSIS — M545 Low back pain, unspecified: Secondary | ICD-10-CM

## 2018-11-17 DIAGNOSIS — M25572 Pain in left ankle and joints of left foot: Secondary | ICD-10-CM | POA: Diagnosis not present

## 2018-11-17 DIAGNOSIS — N179 Acute kidney failure, unspecified: Secondary | ICD-10-CM | POA: Diagnosis present

## 2018-11-17 DIAGNOSIS — E1122 Type 2 diabetes mellitus with diabetic chronic kidney disease: Secondary | ICD-10-CM | POA: Diagnosis not present

## 2018-11-17 DIAGNOSIS — M542 Cervicalgia: Secondary | ICD-10-CM

## 2018-11-17 DIAGNOSIS — E039 Hypothyroidism, unspecified: Secondary | ICD-10-CM | POA: Diagnosis not present

## 2018-11-17 DIAGNOSIS — F419 Anxiety disorder, unspecified: Secondary | ICD-10-CM | POA: Diagnosis not present

## 2018-11-17 DIAGNOSIS — Z6841 Body Mass Index (BMI) 40.0 and over, adult: Secondary | ICD-10-CM | POA: Diagnosis not present

## 2018-11-17 DIAGNOSIS — Z20828 Contact with and (suspected) exposure to other viral communicable diseases: Secondary | ICD-10-CM | POA: Diagnosis not present

## 2018-11-17 DIAGNOSIS — I5032 Chronic diastolic (congestive) heart failure: Secondary | ICD-10-CM | POA: Diagnosis not present

## 2018-11-17 DIAGNOSIS — G8929 Other chronic pain: Secondary | ICD-10-CM

## 2018-11-17 DIAGNOSIS — M79605 Pain in left leg: Secondary | ICD-10-CM | POA: Diagnosis not present

## 2018-11-17 DIAGNOSIS — G4730 Sleep apnea, unspecified: Secondary | ICD-10-CM | POA: Diagnosis not present

## 2018-11-17 DIAGNOSIS — E876 Hypokalemia: Secondary | ICD-10-CM | POA: Diagnosis not present

## 2018-11-17 DIAGNOSIS — E785 Hyperlipidemia, unspecified: Secondary | ICD-10-CM | POA: Diagnosis not present

## 2018-11-17 DIAGNOSIS — I2510 Atherosclerotic heart disease of native coronary artery without angina pectoris: Secondary | ICD-10-CM | POA: Diagnosis not present

## 2018-11-17 DIAGNOSIS — N390 Urinary tract infection, site not specified: Secondary | ICD-10-CM | POA: Diagnosis not present

## 2018-11-17 DIAGNOSIS — I959 Hypotension, unspecified: Secondary | ICD-10-CM | POA: Diagnosis not present

## 2018-11-17 DIAGNOSIS — G90522 Complex regional pain syndrome I of left lower limb: Secondary | ICD-10-CM

## 2018-11-17 LAB — CBC WITH DIFFERENTIAL/PLATELET
Abs Immature Granulocytes: 0.01 10*3/uL (ref 0.00–0.07)
Basophils Absolute: 0 10*3/uL (ref 0.0–0.1)
Basophils Relative: 0 %
Eosinophils Absolute: 0.2 10*3/uL (ref 0.0–0.5)
Eosinophils Relative: 4 %
HCT: 29.7 % — ABNORMAL LOW (ref 36.0–46.0)
Hemoglobin: 9.8 g/dL — ABNORMAL LOW (ref 12.0–15.0)
Immature Granulocytes: 0 %
Lymphocytes Relative: 7 %
Lymphs Abs: 0.4 10*3/uL — ABNORMAL LOW (ref 0.7–4.0)
MCH: 31.5 pg (ref 26.0–34.0)
MCHC: 33 g/dL (ref 30.0–36.0)
MCV: 95.5 fL (ref 80.0–100.0)
Monocytes Absolute: 0.3 10*3/uL (ref 0.1–1.0)
Monocytes Relative: 6 %
Neutro Abs: 4.4 10*3/uL (ref 1.7–7.7)
Neutrophils Relative %: 83 %
Platelets: 150 10*3/uL (ref 150–400)
RBC: 3.11 MIL/uL — ABNORMAL LOW (ref 3.87–5.11)
RDW: 14.4 % (ref 11.5–15.5)
WBC: 5.3 10*3/uL (ref 4.0–10.5)
nRBC: 0 % (ref 0.0–0.2)

## 2018-11-17 LAB — CBC
HCT: 30.7 % — ABNORMAL LOW (ref 36.0–46.0)
Hemoglobin: 9.6 g/dL — ABNORMAL LOW (ref 12.0–15.0)
MCH: 30.7 pg (ref 26.0–34.0)
MCHC: 31.3 g/dL (ref 30.0–36.0)
MCV: 98.1 fL (ref 80.0–100.0)
Platelets: 139 10*3/uL — ABNORMAL LOW (ref 150–400)
RBC: 3.13 MIL/uL — ABNORMAL LOW (ref 3.87–5.11)
RDW: 14.7 % (ref 11.5–15.5)
WBC: 4.8 10*3/uL (ref 4.0–10.5)
nRBC: 0 % (ref 0.0–0.2)

## 2018-11-17 LAB — BASIC METABOLIC PANEL
Anion gap: 8 (ref 5–15)
Anion gap: 9 (ref 5–15)
BUN: 49 mg/dL — ABNORMAL HIGH (ref 6–20)
BUN: 49 mg/dL — ABNORMAL HIGH (ref 6–20)
CO2: 26 mmol/L (ref 22–32)
CO2: 26 mmol/L (ref 22–32)
Calcium: 7.6 mg/dL — ABNORMAL LOW (ref 8.9–10.3)
Calcium: 7.7 mg/dL — ABNORMAL LOW (ref 8.9–10.3)
Chloride: 101 mmol/L (ref 98–111)
Chloride: 101 mmol/L (ref 98–111)
Creatinine, Ser: 2.35 mg/dL — ABNORMAL HIGH (ref 0.44–1.00)
Creatinine, Ser: 2.36 mg/dL — ABNORMAL HIGH (ref 0.44–1.00)
GFR calc Af Amer: 28 mL/min — ABNORMAL LOW (ref >60)
GFR calc Af Amer: 28 mL/min — ABNORMAL LOW (ref >60)
GFR calc non Af Amer: 24 mL/min — ABNORMAL LOW (ref >60)
GFR calc non Af Amer: 24 mL/min — ABNORMAL LOW (ref >60)
Glucose, Bld: 116 mg/dL — ABNORMAL HIGH (ref 70–99)
Glucose, Bld: 136 mg/dL — ABNORMAL HIGH (ref 70–99)
Potassium: 3.3 mmol/L — ABNORMAL LOW (ref 3.5–5.1)
Potassium: 4.2 mmol/L (ref 3.5–5.1)
Sodium: 135 mmol/L (ref 135–145)
Sodium: 136 mmol/L (ref 135–145)

## 2018-11-17 LAB — TSH: TSH: 2.296 u[IU]/mL (ref 0.350–4.500)

## 2018-11-17 LAB — HEPATIC FUNCTION PANEL
ALT: 15 U/L (ref 0–44)
AST: 20 U/L (ref 15–41)
Albumin: 3.4 g/dL — ABNORMAL LOW (ref 3.5–5.0)
Alkaline Phosphatase: 69 U/L (ref 38–126)
Bilirubin, Direct: <0.1 mg/dL (ref 0.0–0.2)
Total Bilirubin: 0.4 mg/dL (ref 0.3–1.2)
Total Protein: 6 g/dL — ABNORMAL LOW (ref 6.5–8.1)

## 2018-11-17 LAB — GLUCOSE, CAPILLARY
Glucose-Capillary: 155 mg/dL — ABNORMAL HIGH (ref 70–99)
Glucose-Capillary: 147 mg/dL — ABNORMAL HIGH (ref 70–99)
Glucose-Capillary: 120 mg/dL — ABNORMAL HIGH (ref 70–99)

## 2018-11-17 LAB — IRON AND TIBC
Iron: 51 ug/dL (ref 28–170)
Saturation Ratios: 20 % (ref 10.4–31.8)
TIBC: 254 ug/dL (ref 250–450)
UIBC: 203 ug/dL

## 2018-11-17 LAB — LACTIC ACID, PLASMA
Lactic Acid, Venous: 0.9 mmol/L (ref 0.5–1.9)
Lactic Acid, Venous: 1.3 mmol/L (ref 0.5–1.9)

## 2018-11-17 LAB — SARS CORONAVIRUS 2 BY RT PCR (HOSPITAL ORDER, PERFORMED IN ~~LOC~~ HOSPITAL LAB): SARS Coronavirus 2: NEGATIVE

## 2018-11-17 LAB — VITAMIN B12: Vitamin B-12: 366 pg/mL (ref 180–914)

## 2018-11-17 LAB — FERRITIN: Ferritin: 49 ng/mL (ref 11–307)

## 2018-11-17 LAB — URINE CULTURE
Culture: <10000 — AB
Special Requests: NORMAL

## 2018-11-17 LAB — BRAIN NATRIURETIC PEPTIDE: B Natriuretic Peptide: 35 pg/mL (ref 0.0–100.0)

## 2018-11-17 LAB — FOLATE: Folate: 11.1 ng/mL (ref >5.9)

## 2018-11-17 LAB — LIPASE, BLOOD: Lipase: 39 U/L (ref 11–51)

## 2018-11-17 MED ORDER — DIVALPROEX SODIUM 250 MG PO DR TAB
250.0000 mg | DELAYED_RELEASE_TABLET | Freq: Two times a day (BID) | ORAL | Status: DC
  Administered 2018-11-17 – 2018-11-18 (×2): 250 mg via ORAL
  Filled 2018-11-17 (×3): qty 1

## 2018-11-17 MED ORDER — TRAMADOL HCL 50 MG PO TABS
50.0000 mg | ORAL_TABLET | Freq: Four times a day (QID) | ORAL | Status: DC | PRN
  Administered 2018-11-17 – 2018-11-18 (×2): 50 mg via ORAL
  Filled 2018-11-17 (×2): qty 1

## 2018-11-17 MED ORDER — NITROFURANTOIN MONOHYD MACRO 100 MG PO CAPS
100.0000 mg | ORAL_CAPSULE | Freq: Two times a day (BID) | ORAL | Status: DC
  Administered 2018-11-17 (×3): 100 mg via ORAL
  Filled 2018-11-17 (×4): qty 1

## 2018-11-17 MED ORDER — INSULIN ASPART 100 UNIT/ML ~~LOC~~ SOLN: 0.0000 [IU] | Freq: Every day | SUBCUTANEOUS | Status: DC

## 2018-11-17 MED ORDER — ACETAMINOPHEN 325 MG PO TABS
650.0000 mg | ORAL_TABLET | Freq: Four times a day (QID) | ORAL | Status: DC | PRN
  Administered 2018-11-17: 650 mg via ORAL
  Filled 2018-11-17: qty 2

## 2018-11-17 MED ORDER — ONDANSETRON HCL 4 MG/2ML IJ SOLN
4.0000 mg | Freq: Four times a day (QID) | INTRAMUSCULAR | Status: DC | PRN
  Administered 2018-11-17: 04:00:00 4 mg via INTRAVENOUS
  Filled 2018-11-17: qty 2

## 2018-11-17 MED ORDER — HEPARIN SODIUM (PORCINE) 5000 UNIT/ML IJ SOLN
5000.0000 [IU] | Freq: Three times a day (TID) | INTRAMUSCULAR | Status: DC
  Filled 2018-11-17 (×3): qty 1

## 2018-11-17 MED ORDER — INSULIN ASPART 100 UNIT/ML ~~LOC~~ SOLN
0.0000 [IU] | Freq: Three times a day (TID) | SUBCUTANEOUS | Status: DC
  Administered 2018-11-17: 09:00:00 1 [IU] via SUBCUTANEOUS
  Filled 2018-11-17: qty 1

## 2018-11-17 MED ORDER — ONDANSETRON HCL 4 MG PO TABS: 4.0000 mg | ORAL_TABLET | Freq: Four times a day (QID) | ORAL | Status: DC | PRN

## 2018-11-17 MED ORDER — DOCUSATE SODIUM 100 MG PO CAPS
100.0000 mg | ORAL_CAPSULE | Freq: Two times a day (BID) | ORAL | Status: DC
  Filled 2018-11-17 (×3): qty 1

## 2018-11-17 MED ORDER — LEVOTHYROXINE SODIUM 50 MCG PO TABS: 250.0000 ug | ORAL_TABLET | Freq: Every day | ORAL | Status: DC

## 2018-11-17 MED ORDER — SODIUM CHLORIDE 0.9 % IV BOLUS
500.0000 mL | Freq: Once | INTRAVENOUS | Status: AC
  Administered 2018-11-17: 03:00:00 500 mL via INTRAVENOUS

## 2018-11-17 MED ORDER — MIRABEGRON ER 50 MG PO TB24
50.0000 mg | ORAL_TABLET | Freq: Every day | ORAL | Status: DC
  Administered 2018-11-17 – 2018-11-18 (×2): 50 mg via ORAL
  Filled 2018-11-17 (×2): qty 1

## 2018-11-17 MED ORDER — ACETAMINOPHEN 650 MG RE SUPP: 650.0000 mg | Freq: Four times a day (QID) | RECTAL | Status: DC | PRN

## 2018-11-17 MED ORDER — POTASSIUM CHLORIDE CRYS ER 20 MEQ PO TBCR
20.0000 meq | EXTENDED_RELEASE_TABLET | Freq: Once | ORAL | Status: AC
  Administered 2018-11-17: 15:00:00 20 meq via ORAL
  Filled 2018-11-17: qty 1

## 2018-11-17 MED ORDER — SODIUM CHLORIDE 0.9 % IV SOLN
INTRAVENOUS | Status: DC
  Administered 2018-11-17: 04:00:00 via INTRAVENOUS

## 2018-11-17 NOTE — Progress Notes (Signed)
Patient was admitted early this morning.  Seen and examined by me later in the morning.  Please see H&P for further details.  Patient states she is overall feeling fine this morning.  She denies any urinary difficulties or urinary symptoms.  She does endorse significant bilateral lower extremity edema over the last couple of days.  No shortness of breath.  On exam, her lungs are clear to auscultation bilaterally.  She has 2+ bilateral lower extremity pitting edema.  She also has bilateral hand edema.  AKI- possibly prerenal in the setting of hypotension.  Cardiorenal syndrome is also a possibility.  Patient does appear volume overloaded on exam.  No NSAID use at home. -Will stop IV fluids, as patient appears volume overloaded this morning -Continue holding home lisinopril -Check renal ultrasound -Nephrology consult  Acute on chronic diastolic congestive heart failure- patient appears volume overloaded on my exam. -Recent ECHO 09/2018 with impaired relaxation and normal EF -Check BNP -Holding home torsemide for now  Hypotension-due to unclear etiology.  BP has improved a little bit. -Holding home lisinopril -Check a.m. cortisol  Hypokalemia -Replete and recheck  Remainder of plan per H&P.  [facility] Bible, MD

## 2018-11-17 NOTE — Telephone Encounter (Signed)
Return call to [name]-patient reports she is currently in the hospital due to acute renal failure.  She wonders whether her Depakote can be changed to another medications since it could be contributing to her kidney failure.  Discussed with patient that we do not have to change the Depakote dosage due to her renal failure.  Her Depakote level was done in August and it was subtherapeutic.  Will not make any readjustment with her Depakote dosage today since she is admitted.  Discussed with patient that the propranolol she takes as needed does cause adverse reaction of increased BUN.  Advised her to reduce the dosage or stop taking it.  However since she is admitted to the hospital her treating provider will be able to make changes with her medication based on what could be causing her kidney problems.  Also psychiatrist on consult team could be called for help with her psychiatric medications.  This was discussed with patient.

## 2018-11-17 NOTE — Progress Notes (Signed)
   11/17/18 1400  Clinical Encounter Type  Visited With Family  Visit Type Initial;Spiritual support  Referral From Nurse  Consult/Referral To Chaplain  Spiritual Encounters  Spiritual Needs Emotional  Chaplain received OR for prayer request. Chaplain visit patient and patient was lying in bed watching tv. Chaplain ask patient if she requested to see a Chaplain. Patient said she was ask if she would like to see Chaplain and she said yes. Chaplain ask how was patient doing and she stated much better than last night. The patient shared with Chaplain that the medication she was taking caused her kidneys to fail. Chaplain offered encouraging words of comfort and patient was appreciative

## 2018-11-17 NOTE — ED Provider Notes (Signed)
[facility] Emergency Department Provider Note  ____________________________________________   First MD Initiated Contact with Patient 11/16/18 2312     (approximate)  I have reviewed the triage vital signs and the nursing notes.   HISTORY  Chief Complaint Hypotension    HPI Marie Clarke is a 48 y.o. female with medical history as listed below who presents for evaluation of hypotension and lightheadedness.  She says that she took her blood pressure tonight and a couple of times in a row it was in the 0000000 systolic.  At the same time she felt generalized weakness and lightheadedness.  She has a chronic issue with her abdominal hernia mesh and has been told that she needs to have a follow-up appointment with [facility] surgery and has an appointment in a few weeks, and she always has a little bit of abdominal pain, but the pain is no worse than usual.  She says she has been trying to eat and drink normally.  She has had no nausea, vomiting, nor diarrhea recently.  She says that she had a low-grade fever of 100.9 degrees at home but she was afebrile in the emergency department.  Nothing particular makes her symptoms better or worse and she describes the symptoms earlier as moderate to severe.  No contact with COVID-19 patients.         Past Medical History:  Diagnosis Date  . Anemia   . Anxiety   . Asthma   . Bipolar disorder ([place])   . CAD (coronary artery disease) unk  . CHF (congestive heart failure) ([place])   . COPD (chronic obstructive pulmonary disease) ([place])   . Depression unk  . Diabetes mellitus without complication ([place])   . Diabetes mellitus, type II ([place])   . Drug overdose   . GERD (gastroesophageal reflux disease)   . Headache   . Hyperlipidemia   . Hypertension   . Left leg pain 04/29/2014  . MI (myocardial infarction) ([place])   . Muscle ache 09/16/2014  . Osteoporosis   . Overactive bladder   . Pancreatitis unk  . Reflex sympathetic dystrophy   .  Renal insufficiency   . Sleep apnea    pt reported on 2/6/7 she currently is not using CPAP  . Sleep apnea   . Stroke ([place])   . Thyroid disease   . TIA (transient ischemic attack) unk  . TIA (transient ischemic attack)     Patient Active Problem List   Diagnosis Date Noted  . Postural dizziness with presyncope 09/29/2018  . Bipolar I disorder, most recent episode mixed ([place]) 09/10/2018  . Panic attacks 09/10/2018  . Chronic lumbosacral L5-S1 IVD protrusion (Bilateral) 08/25/2018  . Lumbar lateral recess stenosis (L5-S1) (Bilateral) 08/25/2018  . Wound disruption 08/05/2018  . Osteoarthritis involving multiple joints 07/10/2018  . Long term current use of non-steroidal anti-inflammatories (NSAID) 07/10/2018  . NSAID induced gastritis 07/10/2018  . DDD (degenerative disc disease), lumbosacral 04/22/2018  . Disease related peripheral neuropathy 11/13/2017  . Gastroesophageal reflux disease without esophagitis 11/13/2017  . Occipital headache (Bilateral) 11/13/2017  . Cervicogenic headache (Bilateral) (L>R) 11/13/2017  . History of postoperative nausea 10/22/2017  . Chronic shoulder pain (Fifth Area of Pain) (Bilateral) (L>R) 10/09/2017  . CRPS (complex regional pain syndrome) type 1 of lower limb (left ankle) 10/09/2017  . Ankle joint instability (Left) 10/09/2017  . Ankle sprain, sequela (Left) 10/09/2017  . History of psychiatric symptoms 10/09/2017  . Chronic pain syndrome 10/02/2017  . Spondylosis without myelopathy or  radiculopathy, lumbosacral region 10/02/2017  . Chronic musculoskeletal pain 10/02/2017  . Elevated C-reactive protein (CRP) 09/10/2017  . Elevated sed rate 09/10/2017  . Chronic neck pain (Fourth Area of Pain) (Bilateral) (L>R) 09/09/2017  . Pharmacologic therapy 09/09/2017  . Disorder of skeletal system 09/09/2017  . Problems influencing health status 09/09/2017  . Long term current use of opiate analgesic 09/09/2017  . Tobacco use disorder 07/16/2017  .  Ventral hernia without obstruction or gangrene 06/25/2017  . Strain of extensor muscle, fascia and tendon of left index finger at wrist and hand level, initial encounter 05/16/2017  . Sepsis ([place]) 04/14/2017  . Osteopenia 04/03/2017  . Hypotension 09/17/2016  . Contusion of knee (Left) 10/12/2015  . Strain of knee (Left) 10/12/2015  . Incidental lung nodule 04/28/2015  . Chronic pain 03/28/2015  . Chronic low back pain (Primary Area of Pain) (Bilateral) (L>R) 03/28/2015  . Chronic lower extremity pain (Referred) (Secondary Area of Pain) (Left) 03/28/2015  . Abdominal wound dehiscence 03/28/2015  . Encounter for pain management planning 03/28/2015  . Morbid obesity ([place]) 03/28/2015  . Abnormal CT scan, lumbar spine 03/28/2015  . Lumbar facet hypertrophy 03/28/2015  . Lumbar facet syndrome (Bilateral) (L>R) 03/28/2015  . Lumbar foraminal stenosis (Bilateral) (L5-S1) 03/28/2015  . Chronic ankle pain [facility] Area of Pain) (Left) 03/28/2015  . Neurogenic pain 03/28/2015  . Neuropathic pain 03/28/2015  . Myofascial pain 03/28/2015  . History of suicide attempt 03/28/2015  . PTSD (post-traumatic stress disorder) 01/13/2015  . Abnormal gait 12/15/2014  . Congestive heart failure ([place]) 11/15/2014  . Abdominal wall abscess 09/20/2014  . 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 Diabetes mellitus, type 2 ([place]) 08/13/2014  . Bipolar affective disorder ([place]) 08/13/2014  . Type 2 diabetes mellitus ([place]) 08/13/2014  . Rectal prolapse 08/09/2014  . Rectal bleeding 08/09/2014  . Rectal bleed 08/09/2014  . Affective bipolar disorder ([place]) 08/05/2014  . Arteriosclerosis of coronary artery 08/05/2014  . CCF (congestive cardiac failure) ([place]) 08/05/2014  . Chronic kidney disease 08/05/2014  . 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 Apnea, sleep 08/05/2014  . Temporary cerebral vascular dysfunction 08/05/2014  . Polypharmacy 04/29/2014  . Other long term (current) drug therapy 04/29/2014  .  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 Chronic kidney disease, stage III (moderate) ([place]) 12/14/2013  . Controlled diabetes mellitus type II without complication ([place]) 99991111  . Essential (primary) hypertension 12/03/2013  . Adult hypothyroidism 12/03/2013  . Controlled type 2 diabetes mellitus without complication ([place]) 99991111    Past Surgical History:  Procedure Laterality Date  . ABDOMINAL HYSTERECTOMY    . HERNIA REPAIR  07/15/2017   [facility]  . prolapse rectum surgery N/A July 2016  . TONSILLECTOMY      Prior to Admission medications   Medication Sig Start Date End Date Taking? Authorizing Provider  aspirin EC 81 MG tablet Take 81 mg by mouth daily at 12 noon.    [provider]  atorvastatin (LIPITOR) 40 MG tablet Take 40 mg by mouth at bedtime.     [provider]  busPIRone (BUSPAR) 10 MG tablet Take 2 tablets (20 mg total) by mouth 3 (three) times daily. 07/08/18   Ursula Alert, MD  diphenhydrAMINE (BENADRYL) 25 mg capsule TAKE 1 CAPSULE BY MOUTH TWICE DAILY AS NEEDED FOR ITCHING Patient taking differently: Take 25 mg by mouth 2 (two) times daily as needed for itching.  09/09/18   Ursula Alert, MD  divalproex (DEPAKOTE) 250 MG DR tablet TAKE 1 TABLET BY MOUTH 3 TIMES DAILY  11/10/18   Ursula Alert, MD  Dulaglutide 0.75 MG/0.5ML SOPN Inject 0.75 mg into the skin every Saturday.  07/15/18   [provider]  KLOR-CON M20 20 MEQ tablet Take 20 mEq by mouth 2 (two) times daily.     [provider]  levocetirizine (XYZAL) 5 MG tablet Take 5 mg by mouth daily.  06/04/18   [provider]  levothyroxine (SYNTHROID) 25 MCG tablet Take 50 mcg by mouth daily. (232mcg total daily) 09/03/18   [provider]  levothyroxine (SYNTHROID, LEVOTHROID) 200 MCG tablet Take 200 mcg by mouth daily. (247mcg total daily)    [provider]  Magnesium Oxide 500 MG CAPS Take 1 capsule (500 mg total) by mouth 2 (two) times daily at 8 am  and 10 pm. 10/28/18 04/26/19  Milinda Pointer, MD  meloxicam (MOBIC) 15 MG tablet Take 1 tablet (15 mg total) by mouth daily with breakfast. Max: 1/day 10/28/18 04/26/19  Milinda Pointer, MD  methocarbamol (ROBAXIN) 750 MG tablet Take 1 tablet (750 mg total) by mouth every 8 (eight) hours as needed for muscle spasms. 10/28/18 04/26/19  Milinda Pointer, MD  montelukast (SINGULAIR) 10 MG tablet Take 10 mg by mouth daily.     [provider]  MYRBETRIQ 50 MG TB24 tablet Take 50 mg by mouth daily.     [provider]  nitroGLYCERIN (NITROSTAT) 0.4 MG SL tablet Place 0.4 mg under the tongue every 5 (five) minutes as needed for chest pain.     [provider]  omeprazole (PRILOSEC) 20 MG capsule Take 1 capsule (20 mg total) by mouth at bedtime. 10/28/18 04/26/19  Milinda Pointer, MD  ondansetron (ZOFRAN-ODT) 8 MG disintegrating tablet Take 8 mg by mouth every 8 (eight) hours as needed for nausea or vomiting.    [provider]  pregabalin (LYRICA) 150 MG capsule Take 1 capsule (150 mg total) by mouth 2 (two) times daily. 11/19/18 04/13/19  Milinda Pointer, MD  propranolol (INDERAL) 20 MG tablet TAKE 1 TABLET BY MOUTH 3 TIMES A DAY AS NEEDED FOR ANXIETY 11/14/18   Ursula Alert, MD  QUEtiapine (SEROQUEL) 100 MG tablet Take 1-1.5 tablets (100-150 mg total) by mouth at bedtime as needed. 09/10/18   Ursula Alert, MD  rizatriptan (MAXALT) 10 MG tablet Take 1 tablet (10 mg total) by mouth as needed. Patient taking differently: Take 10 mg by mouth as needed for migraine.  06/03/18   Vevelyn Francois, NP  torsemide (DEMADEX) 20 MG tablet Take 40 mg by mouth 2 (two) times daily.  06/09/18 06/09/19  [provider]  traMADol (ULTRAM) 50 MG tablet Take 1-2 tablets (50-100 mg total) by mouth every 6 (six) hours. 12/17/18 04/16/19  Milinda Pointer, MD  TRELEGY ELLIPTA 100-62.5-25 MCG/INH AEPB Inhale 1 puff into the lungs daily.     [provider]  VENTOLIN HFA  108 (90 Base) MCG/ACT inhaler Inhale 1-2 puffs into the lungs every 6 (six) hours as needed for wheezing or shortness of breath.  05/03/18   [provider]    Allergies Diazepam, Levofloxacin, Metronidazole, Sulfa antibiotics, Cephalexin, Ciprofloxacin, Ziprasidone hcl, and Penicillins  Family History  Problem Relation Age of Onset  . Diabetes Mellitus II Mother   . CAD Mother   . Sleep apnea Mother   . Osteoarthritis Mother   . Osteoporosis Mother   . Anxiety disorder Mother   . Depression Mother   . Bipolar disorder Mother   . Bipolar disorder Father   . Hypertension Father   .  Depression Father   . Anxiety disorder Father   . Post-traumatic stress disorder Sister     Social History Social History   Tobacco Use  . Smoking status: Former Smoker    Packs/day: 0.50    Types: Cigarettes  . Smokeless tobacco: Never Used  Substance Use Topics  . Alcohol use: No    Alcohol/week: 0.0 standard drinks  . Drug use: No    Review of Systems Constitutional: No fever/chills.  Generalized weakness and lightheadedness. Eyes: No visual changes. ENT: No sore throat. Cardiovascular: Denies chest pain. Respiratory: Denies shortness of breath. Gastrointestinal: Some mild chronic abdominal pain but no acute changes.  No nausea, no vomiting.  No diarrhea.  No constipation. Genitourinary: Negative for dysuria. Musculoskeletal: Negative for neck pain.  Negative for back pain. Integumentary: Negative for rash. Neurological: Lightheadedness and some generalized weakness.  Negative for headaches, focal weakness or numbness.   ____________________________________________   PHYSICAL EXAM:  VITAL SIGNS: ED Triage Vitals  Enc Vitals Group     BP 11/16/18 2210 (!) 97/47     Pulse Rate 11/16/18 2210 (!) 113     Resp 11/16/18 2210 18     Temp 11/16/18 2210 99.2 F (37.3 C)     Temp Source 11/16/18 2210 Oral     SpO2 11/16/18 2210 95 %     Weight 11/16/18 2211 102.1 kg (225 lb)      Height 11/16/18 2211 1.626 m (5\' 4" )     Head Circumference --      Peak Flow --      Pain Score 11/16/18 2219 9     Pain Loc --      Pain Edu? --      Excl. in [place]? --     Constitutional: Alert and oriented.  No acute distress at this time. Eyes: Conjunctivae are normal.  Head: Atraumatic. Nose: No congestion/rhinnorhea. Mouth/Throat: Mucous membranes are moist. Neck: No stridor.  No meningeal signs.   Cardiovascular: Normal rate, regular rhythm. Good peripheral circulation. Grossly normal heart sounds. Respiratory: Normal respiratory effort.  No retractions. Gastrointestinal: Soft and nontender.  Nonspecific mild tenderness to palpation throughout the abdomen without any focal tenderness although the pain does seem to be a little bit worse in the suprapubic region. Musculoskeletal: No lower extremity tenderness nor edema. No gross deformities of extremities. Neurologic:  Normal speech and language. No gross focal neurologic deficits are appreciated.  Skin:  Skin is warm, dry and intact. Psychiatric: Mood and affect are normal. Speech and behavior are normal.  ____________________________________________   LABS (all labs ordered are listed, but only abnormal results are displayed)  Labs Reviewed  URINALYSIS, COMPLETE (UACMP) WITH MICROSCOPIC - Abnormal; Notable for the following components:      Result Value   Color, Urine YELLOW (*)    APPearance HAZY (*)    Leukocytes,Ua SMALL (*)    All other components within normal limits  CBC WITH DIFFERENTIAL/PLATELET - Abnormal; Notable for the following components:   RBC 3.11 (*)    Hemoglobin 9.8 (*)    HCT 29.7 (*)    Lymphs Abs 0.4 (*)    All other components within normal limits  BASIC METABOLIC PANEL - Abnormal; Notable for the following components:   Glucose, Bld 116 (*)    BUN 49 (*)    Creatinine, Ser 2.36 (*)    Calcium 7.7 (*)    GFR calc non Af Amer 24 (*)    GFR calc Af Amer 28 (*)  All other components  within normal limits  HEPATIC FUNCTION PANEL - Abnormal; Notable for the following components:   Total Protein 6.0 (*)    Albumin 3.4 (*)    All other components within normal limits  SARS CORONAVIRUS 2 (HOSPITAL ORDER, PERFORMED IN [facility])  URINE CULTURE  LACTIC ACID, PLASMA  LIPASE, BLOOD  CBC WITH DIFFERENTIAL/PLATELET  LACTIC ACID, PLASMA   ____________________________________________  EKG  No indication for EKG ____________________________________________  RADIOLOGY I, Hinda Kehr, personally viewed and evaluated these images (plain radiographs) as part of my medical decision making, as well as reviewing the written report by the radiologist.  ED MD interpretation: No indication for emergent imaging  Official radiology report(s): No results found.  ____________________________________________   PROCEDURES   Procedure(s) performed (including Critical Care):  Procedures   ____________________________________________   INITIAL IMPRESSION / MDM / [place] / ED COURSE  As part of my medical decision making, I reviewed the following data within the [place] notes reviewed and incorporated, Labs reviewed , Old chart reviewed, Discussed with admitting physician  and Notes from prior ED visits   Differential diagnosis includes, but is not limited to, volume depletion leading to hypotension, electrolyte or metabolic abnormalities, acute infection, less likely ACS.  The patient's vital signs are generally reassuring although she was initially hypotensive.  After a 500 mL IV bolus her blood pressure has improved to the normal limits.  Initially she was mildly tachycardic but that improved to the 90s after the fluid bolus.  IV access was difficult and the lab had to be contacted and 2 different phlebotomist before the IV was established.  Lab work is notable for no leukocytosis, normal lactic acid, questionable UTI, normal  LFTs, but a basic metabolic panel demonstrating BUN of 49 and creatinine of 2.36.  I looked through her record and her last several creatinines on record have been at or below 1.  This indicates acute renal failure and she qualifies for inpatient treatment with IV fluids and possible nephrology consult as per the hospitalist team recommendations.  I updated the patient with my recommendations and she agrees with plan.      Clinical Course as of Nov 16 112  Mon Nov 17, 2018  0012 WBC, UA: 21-50 [CF]  0101 SARS Coronavirus 2: NEGATIVE [CF]  0114 Dr. Marcille Blanco acknowledged plan for admission.   [CF]    Clinical Course User Index [CF] Hinda Kehr, MD     ____________________________________________  FINAL CLINICAL IMPRESSION(S) / ED DIAGNOSES  Final diagnoses:  Acute renal failure, unspecified acute renal failure type ([place])  Urinary tract infection without hematuria, site unspecified  Hypotension, unspecified hypotension type     MEDICATIONS GIVEN DURING THIS VISIT:  Medications  sodium chloride 0.9 % bolus 500 mL (has no administration in time range)  nitrofurantoin (macrocrystal-monohydrate) (MACROBID) capsule 100 mg (has no administration in time range)  sodium chloride 0.9 % bolus 500 mL (500 mLs Intravenous New Bag/Given 11/16/18 2332)     ED Discharge Orders    None      *Please note:  Marie Clarke was evaluated in Emergency Department on 11/17/2018 for the symptoms described in the history of present illness. She was evaluated in the context of the global COVID-19 pandemic, which necessitated consideration that the patient might be at risk for infection with the SARS-CoV-2 virus that causes COVID-19. Institutional protocols and algorithms that pertain to the evaluation of patients at risk for COVID-19 are  in a state of rapid change based on information released by regulatory bodies including the CDC and federal and state organizations. These policies and algorithms were  followed during the patient's care in the ED.  Some ED evaluations and interventions may be delayed as a result of limited staffing during the pandemic.*  Note:  This document was prepared using Dragon voice recognition software and may include unintentional dictation errors.   Hinda Kehr, MD 11/17/18 [phone]

## 2018-11-17 NOTE — H&P (Signed)
Marie Clarke is an 48 y.o. female.   Chief Complaint: Lightheadedness HPI: The patient with past medical history of diabetes, CHF, CAD, hypertension, hyperlipidemia and COPD presents to the emergency department due to lightheadedness.  The patient took her blood pressure multiple times prior to seeking evaluation and found the systolic pressure to be in the 90s.  She did not have any syncopal episodes but felt weak like she may pass out.  She denies chest pain or palpitations.  She was concerned she may have a fever at home but she was afebrile upon arrival to the emergency department.  She denies cough or shortness of breath.  She also denies nausea, vomiting or diarrhea.  COVID test negative.  Laboratory evaluation significant for acute on chronic kidney injury.  Thus the emergency department staff call hospitalist service for admission.  Past Medical History:  Diagnosis Date  . Anemia   . Anxiety   . Asthma   . Bipolar disorder ([place])   . CAD (coronary artery disease) unk  . CHF (congestive heart failure) ([place])   . COPD (chronic obstructive pulmonary disease) ([place])   . Depression unk  . Diabetes mellitus without complication ([place])   . Diabetes mellitus, type II ([place])   . Drug overdose   . GERD (gastroesophageal reflux disease)   . Headache   . Hyperlipidemia   . Hypertension   . Left leg pain 04/29/2014  . MI (myocardial infarction) ([place])   . Muscle ache 09/16/2014  . Osteoporosis   . Overactive bladder   . Pancreatitis unk  . Reflex sympathetic dystrophy   . Renal insufficiency   . Sleep apnea    pt reported on 2/6/7 she currently is not using CPAP  . Sleep apnea   . Stroke ([place])   . Thyroid disease   . TIA (transient ischemic attack) unk  . TIA (transient ischemic attack)     Past Surgical History:  Procedure Laterality Date  . ABDOMINAL HYSTERECTOMY    . HERNIA REPAIR  07/15/2017   [facility]  . prolapse rectum surgery N/A July 2016  . TONSILLECTOMY      Family History   Problem Relation Age of Onset  . Diabetes Mellitus II Mother   . CAD Mother   . Sleep apnea Mother   . Osteoarthritis Mother   . Osteoporosis Mother   . Anxiety disorder Mother   . Depression Mother   . Bipolar disorder Mother   . Bipolar disorder Father   . Hypertension Father   . Depression Father   . Anxiety disorder Father   . Post-traumatic stress disorder Sister    Social History:  reports that she has quit smoking. Her smoking use included cigarettes. She smoked 0.50 packs per day. She has never used smokeless tobacco. She reports that she does not drink alcohol or use drugs.  Allergies:  Allergies  Allergen Reactions  . Diazepam Hives and Nausea And Vomiting  . Levofloxacin Hives and Rash    1/31: would like to retry since not taking valium  . Metronidazole Hives    1/31: would like to retry since she is no longer taking valium  . Sulfa Antibiotics Hives  . Cephalexin Hives  . Ciprofloxacin Hives  . Ziprasidone Hcl Other (See Comments)    CONFUSED CONFUSED Other reaction(s): Other (See Comments) CONFUSED CONFUSED   . Penicillins Nausea And Vomiting    Has patient had a PCN reaction causing immediate rash, facial/tongue/throat swelling, SOB or lightheadedness with hypotension: No Has patient  had a PCN reaction causing severe rash involving mucus membranes or skin necrosis: No Has patient had a PCN reaction that required hospitalization: No Has patient had a PCN reaction occurring within the last 10 years: No If all of the above answers are "NO", then may proceed with Cephalosporin use.     Medications Prior to Admission  Medication Sig Dispense Refill  . aspirin EC 81 MG tablet Take 81 mg by mouth daily.     Marland Kitchen atorvastatin (LIPITOR) 40 MG tablet Take 40 mg by mouth at bedtime.     . busPIRone (BUSPAR) 10 MG tablet Take 2 tablets (20 mg total) by mouth 3 (three) times daily. 540 tablet 1  . diphenhydrAMINE (BENADRYL) 25 mg capsule TAKE 1 CAPSULE BY MOUTH TWICE  DAILY AS NEEDED FOR ITCHING (Patient taking differently: Take 25 mg by mouth 2 (two) times daily as needed for itching. ) 60 capsule 1  . divalproex (DEPAKOTE) 250 MG DR tablet TAKE 1 TABLET BY MOUTH 3 TIMES DAILY (Patient taking differently: Take 250 mg by mouth 2 (two) times daily. ) 90 tablet 1  . Dulaglutide 0.75 MG/0.5ML SOPN Inject 0.75 mg into the skin every Saturday.     Marland Kitchen KLOR-CON M20 20 MEQ tablet Take 20 mEq by mouth 2 (two) times daily.     Marland Kitchen levocetirizine (XYZAL) 5 MG tablet Take 5 mg by mouth daily.     Marland Kitchen levothyroxine (SYNTHROID) 50 MCG tablet Take 50 mcg by mouth daily. (213mcg total daily)    . levothyroxine (SYNTHROID, LEVOTHROID) 200 MCG tablet Take 200 mcg by mouth daily. (250mcg total daily)    . Magnesium Oxide 500 MG CAPS Take 1 capsule (500 mg total) by mouth 2 (two) times daily at 8 am and 10 pm. 60 capsule 5  . meloxicam (MOBIC) 15 MG tablet Take 1 tablet (15 mg total) by mouth daily with breakfast. Max: 1/day 30 tablet 5  . methocarbamol (ROBAXIN) 750 MG tablet Take 1 tablet (750 mg total) by mouth every 8 (eight) hours as needed for muscle spasms. 90 tablet 5  . montelukast (SINGULAIR) 10 MG tablet Take 10 mg by mouth daily.     Marland Kitchen MYRBETRIQ 50 MG TB24 tablet Take 50 mg by mouth daily.     . nitroGLYCERIN (NITROSTAT) 0.4 MG SL tablet Place 0.4 mg under the tongue every 5 (five) minutes as needed for chest pain.     Marland Kitchen omeprazole (PRILOSEC) 20 MG capsule Take 1 capsule (20 mg total) by mouth at bedtime. 30 capsule 5  . ondansetron (ZOFRAN-ODT) 8 MG disintegrating tablet Take 8 mg by mouth every 8 (eight) hours as needed for nausea or vomiting.    Derrill Memo ON 11/19/2018] pregabalin (LYRICA) 150 MG capsule Take 1 capsule (150 mg total) by mouth 2 (two) times daily. 290 capsule 0  . propranolol (INDERAL) 20 MG tablet TAKE 1 TABLET BY MOUTH 3 TIMES A DAY AS NEEDED FOR ANXIETY (Patient taking differently: Take 20 mg by mouth 3 (three) times daily as needed (anxiety). ) 90 tablet 1   . QUEtiapine (SEROQUEL) 100 MG tablet Take 1-1.5 tablets (100-150 mg total) by mouth at bedtime as needed. (Patient taking differently: Take 100-150 mg by mouth at bedtime as needed (sleep). ) 135 tablet 0  . rizatriptan (MAXALT) 10 MG tablet Take 1 tablet (10 mg total) by mouth as needed. (Patient taking differently: Take 10 mg by mouth as needed for migraine. ) 10 tablet 2  . torsemide (DEMADEX) 20 MG  tablet Take 40 mg by mouth 2 (two) times daily.     Derrill Memo ON 12/17/2018] traMADol (ULTRAM) 50 MG tablet Take 1-2 tablets (50-100 mg total) by mouth every 6 (six) hours. 240 tablet 3  . TRELEGY ELLIPTA 100-62.5-25 MCG/INH AEPB Inhale 1 puff into the lungs daily.     . VENTOLIN HFA 108 (90 Base) MCG/ACT inhaler Inhale 1-2 puffs into the lungs every 6 (six) hours as needed for wheezing or shortness of breath.       Results for orders placed or performed during the hospital encounter of 11/16/18 (from the past 48 hour(s))  Urinalysis, Complete w Microscopic     Status: Abnormal   Collection Time: 11/16/18 10:41 PM  Result Value Ref Range   Color, Urine YELLOW (A) YELLOW   APPearance HAZY (A) CLEAR   Specific Gravity, Urine 1.015 1.005 - 1.030   pH 5.0 5.0 - 8.0   Glucose, UA NEGATIVE NEGATIVE mg/dL   Hgb urine dipstick NEGATIVE NEGATIVE   Bilirubin Urine NEGATIVE NEGATIVE   Ketones, ur NEGATIVE NEGATIVE mg/dL   Protein, ur NEGATIVE NEGATIVE mg/dL   Nitrite NEGATIVE NEGATIVE   Leukocytes,Ua SMALL (A) NEGATIVE   RBC / HPF 0-5 0 - 5 RBC/hpf   WBC, UA 21-50 0 - 5 WBC/hpf   Bacteria, UA NONE SEEN NONE SEEN   Squamous Epithelial / LPF 0-5 0 - 5   WBC Clumps PRESENT    Hyaline Casts, UA PRESENT    Cellular Cast, UA PRESENT     Comment: Performed at [facility], [address], [place], [place] [address]  SARS Coronavirus 2 [facility] [place] order, Performed in [facility] hospital lab) Nasopharyngeal Nasopharyngeal Swab     Status: None   Collection Time: 11/16/18 11:34 PM   Specimen:  Nasopharyngeal Swab  Result Value Ref Range   SARS Coronavirus 2 NEGATIVE NEGATIVE    Comment: (NOTE) If result is NEGATIVE SARS-CoV-2 target nucleic acids are NOT DETECTED. The SARS-CoV-2 RNA is generally detectable in upper and lower  respiratory specimens during the acute phase of infection. The lowest  concentration of SARS-CoV-2 viral copies this assay can detect is 250  copies / mL. A negative result does not preclude SARS-CoV-2 infection  and should not be used as the sole basis for treatment or other  patient management decisions.  A negative result may occur with  improper specimen collection / handling, submission of specimen other  than nasopharyngeal swab, presence of viral mutation(s) within the  areas targeted by this assay, and inadequate number of viral copies  (<250 copies / mL). A negative result must be combined with clinical  observations, patient history, and epidemiological information. If result is POSITIVE SARS-CoV-2 target nucleic acids are DETECTED. The SARS-CoV-2 RNA is generally detectable in upper and lower  respiratory specimens dur ing the acute phase of infection.  Positive  results are indicative of active infection with SARS-CoV-2.  Clinical  correlation with patient history and other diagnostic information is  necessary to determine patient infection status.  Positive results do  not rule out bacterial infection or co-infection with other viruses. If result is PRESUMPTIVE POSTIVE SARS-CoV-2 nucleic acids MAY BE PRESENT.   A presumptive positive result was obtained on the submitted specimen  and confirmed on repeat testing.  While 2019 novel coronavirus  (SARS-CoV-2) nucleic acids may be present in the submitted sample  additional confirmatory testing may be necessary for epidemiological  and / or clinical management purposes  to differentiate between  SARS-CoV-2 and other Sarbecovirus currently known to infect humans.  If clinically indicated  additional testing with an alternate test  methodology [phone]) is advised. The SARS-CoV-2 RNA is generally  detectable in upper and lower respiratory sp ecimens during the acute  phase of infection. The expected result is Negative. Fact Sheet for Patients:  StrictlyIdeas.no Fact Sheet for Healthcare Providers: BankingDealers.co.za This test is not yet approved or cleared by the [place] FDA and has been authorized for detection and/or diagnosis of SARS-CoV-2 by FDA under an Emergency Use Authorization (EUA).  This EUA will remain in effect (meaning this test can be used) for the duration of the COVID-19 declaration under Section 564(b)(1) of the Act, 21 U.S.C. section 360bbb-3(b)(1), unless the authorization is terminated or revoked sooner. Performed at [facility], [address] [place], [place] [address]   Lactic acid, plasma     Status: None   Collection Time: 11/17/18 12:05 AM  Result Value Ref Range   Lactic Acid, Venous 0.9 0.5 - 1.9 mmol/L    Comment: Performed at [facility], [address] [place], [place] [address]  CBC with Differential/Platelet     Status: Abnormal   Collection Time: 11/17/18 12:05 AM  Result Value Ref Range   WBC 5.3 4.0 - 10.5 K/uL   RBC 3.11 (L) 3.87 - 5.11 MIL/uL   Hemoglobin 9.8 (L) 12.0 - 15.0 g/dL   HCT 29.7 (L) 36.0 - 46.0 %   MCV 95.5 80.0 - 100.0 fL   MCH 31.5 26.0 - 34.0 pg   MCHC 33.0 30.0 - 36.0 g/dL   RDW 14.4 11.5 - 15.5 %   Platelets 150 150 - 400 K/uL   nRBC 0.0 0.0 - 0.2 %   Neutrophils Relative % 83 %   Neutro Abs 4.4 1.7 - 7.7 K/uL   Lymphocytes Relative 7 %   Lymphs Abs 0.4 (L) 0.7 - 4.0 K/uL   Monocytes Relative 6 %   Monocytes Absolute 0.3 0.1 - 1.0 K/uL   Eosinophils Relative 4 %   Eosinophils Absolute 0.2 0.0 - 0.5 K/uL   Basophils Relative 0 %   Basophils Absolute 0.0 0.0 - 0.1 K/uL   Immature Granulocytes 0 %   Abs Immature Granulocytes  0.01 0.00 - 0.07 K/uL    Comment: Performed at [facility], [address] [place], [place] [id number]  Basic metabolic panel     Status: Abnormal   Collection Time: 11/17/18 12:05 AM  Result Value Ref Range   Sodium 135 135 - 145 mmol/L   Potassium 4.2 3.5 - 5.1 mmol/L   Chloride 101 98 - 111 mmol/L   CO2 26 22 - 32 mmol/L   Glucose, Bld 116 (H) 70 - 99 mg/dL   BUN 49 (H) 6 - 20 mg/dL   Creatinine, Ser 2.36 (H) 0.44 - 1.00 mg/dL   Calcium 7.7 (L) 8.9 - 10.3 mg/dL   GFR calc non Af Amer 24 (L) >60 mL/min   GFR calc Af Amer 28 (L) >60 mL/min   Anion gap 8 5 - 15    Comment: Performed at [facility], [address] [place], [place] [address]  Hepatic function panel     Status: Abnormal   Collection Time: 11/17/18 12:05 AM  Result Value Ref Range   Total Protein 6.0 (L) 6.5 - 8.1 g/dL   Albumin 3.4 (L) 3.5 - 5.0 g/dL   AST 20 15 - 41 U/L   ALT 15 0 -  44 U/L   Alkaline Phosphatase 69 38 - 126 U/L   Total Bilirubin 0.4 0.3 - 1.2 mg/dL   Bilirubin, Direct <0.1 0.0 - 0.2 mg/dL   Indirect Bilirubin NOT CALCULATED 0.3 - 0.9 mg/dL    Comment: Performed at [facility], [address] [place], [place] [address]  Lipase, blood     Status: None   Collection Time: 11/17/18 12:05 AM  Result Value Ref Range   Lipase 39 11 - 51 U/L    Comment: Performed at [facility], [address] [place], [place] [address]  TSH     Status: None   Collection Time: 11/17/18 12:05 AM  Result Value Ref Range   TSH 2.296 0.350 - 4.500 uIU/mL    Comment: Performed by a 3rd Generation assay with a functional sensitivity of <=0.01 uIU/mL. Performed at [facility], [address] [place], [place] [address]   Glucose, capillary     Status: Abnormal   Collection Time: 11/17/18  2:47 AM  Result Value Ref Range   Glucose-Capillary 155 (H) 70 - 99 mg/dL   Comment 1 Notify RN   Lactic acid, plasma     Status: None   Collection Time: 11/17/18  3:29 AM   Result Value Ref Range   Lactic Acid, Venous 1.3 0.5 - 1.9 mmol/L    Comment: Performed at [facility], [address], [place], [place] [address]   No results found.  Review of Systems  Constitutional: Negative for chills and fever.  HENT: Negative for sore throat and tinnitus.   Eyes: Negative for blurred vision and redness.  Respiratory: Negative for cough and shortness of breath.   Cardiovascular: Negative for chest pain, palpitations, orthopnea and PND.  Gastrointestinal: Negative for abdominal pain, diarrhea, nausea and vomiting.  Genitourinary: Negative for dysuria, frequency and urgency.  Musculoskeletal: Negative for joint pain and myalgias.  Skin: Negative for rash.       No lesions  Neurological: Negative for speech change, focal weakness and weakness.  Endo/Heme/Allergies: Does not bruise/bleed easily.       No temperature intolerance  Psychiatric/Behavioral: Negative for depression and suicidal ideas.    Blood pressure (!) 86/49, pulse 86, temperature 98.1 F (36.7 C), temperature source Oral, resp. rate 20, height 5\' 4"  (1.626 m), weight 106.5 kg, SpO2 100 %. Physical Exam  Vitals reviewed. Constitutional: She is oriented to person, place, and time. She appears well-developed and well-nourished. No distress.  HENT:  Head: Normocephalic and atraumatic.  Mouth/Throat: Oropharynx is clear and moist.  Eyes: Pupils are equal, round, and reactive to light. Conjunctivae and EOM are normal. No scleral icterus.  Neck: Normal range of motion. Neck supple. No tracheal deviation present. No thyromegaly present.  Cardiovascular: Normal rate, regular rhythm and normal heart sounds. Exam reveals no gallop and no friction rub.  No murmur heard. Respiratory: Effort normal and breath sounds normal.  GI: Soft. Bowel sounds are normal. She exhibits no distension. There is no abdominal tenderness.  Genitourinary:    Genitourinary Comments: Deferred   Musculoskeletal:  Normal range of motion.        General: Edema (+1 ankles b/l) present.  Lymphadenopathy:    She has no cervical adenopathy.  Neurological: She is alert and oriented to person, place, and time. No cranial nerve deficit. She exhibits normal muscle tone.  Skin: Skin is warm and dry. No rash noted. No erythema.  Psychiatric: She has a normal mood and affect. Her behavior is normal.  Judgment and thought content normal.     Assessment/Plan This is a 48 year old female admitted for acute kidney injury. 1.  AKI: Superimposed upon chronic kidney disease.  Hydrate with intravenous fluid.  I have held the patient's torsemide for now.  Avoid nephrotoxic agents.  Consult nephrology if expected decline in creatinine not observed. 2.  CHF: Chronic; diastolic.  3.  Hypertension: Hold antihypertensive medication for now.  If she continues to be hypotensive she may need dobutamine to increase prerenal flow while adding pressor effect. 4.  Diabetes mellitus type 2: While hospitalized 5.  Hyperlipidemia: Continue statin therapy 6.  UTI: Present on admission; continue Macrobid 7.  Hypothyroidism: Check TSH; continue Synthroid 8.  DVT prophylaxis: Heparin 9.  GI prophylaxis: None The patient is a full code.  Time spent on admission orders and patient care approximately 45 minutes  Harrie Foreman, MD 11/17/2018, 7:06 AM

## 2018-11-17 NOTE — Patient Instructions (Addendum)
Medication instructions to follow until kidney function recovers. You were recently found to have acute renal failure.  Until this resolves, make the following changes in your medications: 1. Tramadol (Ultram): Take no more than 1 tablet (50 mg) every 6 hours. 2. Magnesium: Take no more than 1 tablet (500 mg) per day. 3. Meloxicam (Mobic): Completely discontinue the use of this medication until further notice.  Do not take any NSAIDs (Motrin/ibuprofen/naproxen/Naprosyn/Advil/etc.). 4. Methocarbamol (Robaxin): Take no more than 1 tablet (750 mg) per day. 5. Pregabalin (Lyrica): Decrease dose to a maximum of 150 mg at bedtime.   Consult your kidney Doctor to confirm recommendations. Continue with this regimen until kidney function returns to normal levels (eGFR is > 60).

## 2018-11-17 NOTE — ED Notes (Signed)
ED TO INPATIENT HANDOFF REPORT  ED Nurse Name and Phone #: Dao Memmott [phone]   S Name/Age/Gender Marie Clarke 48 y.o. female Room/Bed: ED10A/ED10A  Code Status   Code Status: Prior  Home/SNF/Other Home Patient oriented to: self, place, time and situation Is this baseline? Yes   Triage Complete: Triage complete  Chief Complaint Low BP  Triage Note Pt arrives POV and ambulatory to triage with c/o low BP "for a few hours". Pt states that Dr Dahlia Byes told her that her hernia mesh was infected and she has an appointment to schedule surgery on 10/26. Pt states that she has also had a low grade fever.    Allergies Allergies  Allergen Reactions  . Diazepam Hives and Nausea And Vomiting  . Levofloxacin Hives and Rash    1/31: would like to retry since not taking valium  . Metronidazole Hives    1/31: would like to retry since she is no longer taking valium  . Sulfa Antibiotics Hives  . Cephalexin Hives  . Ciprofloxacin Hives  . Ziprasidone Hcl Other (See Comments)    CONFUSED CONFUSED Other reaction(s): Other (See Comments) CONFUSED CONFUSED   . Penicillins Nausea And Vomiting    Has patient had a PCN reaction causing immediate rash, facial/tongue/throat swelling, SOB or lightheadedness with hypotension: No Has patient had a PCN reaction causing severe rash involving mucus membranes or skin necrosis: No Has patient had a PCN reaction that required hospitalization: No Has patient had a PCN reaction occurring within the last 10 years: No If all of the above answers are "NO", then may proceed with Cephalosporin use.     Level of Care/Admitting Diagnosis ED Disposition    ED Disposition Condition [facility] Area: [place] [[id number]]  Level of Care: Med-Surg [16]  Covid Evaluation: Confirmed COVID Negative  Diagnosis: [facility]acute kidney injury) [id number]  Admitting Physician: Harrie Foreman [id number]  Attending Physician: Harrie Foreman [id number]  Estimated length of stay: past midnight tomorrow  Certification:: I certify this patient will need inpatient services for at least 2 midnights  PT Class (Do Not Modify): Inpatient [101]  PT Acc Code (Do Not Modify): Private [1]       B Medical/Surgery History Past Medical History:  Diagnosis Date  . Anemia   . Anxiety   . Asthma   . Bipolar disorder ([place])   . CAD (coronary artery disease) unk  . CHF (congestive heart failure) ([place])   . COPD (chronic obstructive pulmonary disease) ([place])   . Depression unk  . Diabetes mellitus without complication ([place])   . Diabetes mellitus, type II ([place])   . Drug overdose   . GERD (gastroesophageal reflux disease)   . Headache   . Hyperlipidemia   . Hypertension   . Left leg pain 04/29/2014  . MI (myocardial infarction) ([place])   . Muscle ache 09/16/2014  . Osteoporosis   . Overactive bladder   . Pancreatitis unk  . Reflex sympathetic dystrophy   . Renal insufficiency   . Sleep apnea    pt reported on 2/6/7 she currently is not using CPAP  . Sleep apnea   . Stroke ([place])   . Thyroid disease   . TIA (transient ischemic attack) unk  . TIA (transient ischemic attack)    Past Surgical History:  Procedure Laterality Date  . ABDOMINAL HYSTERECTOMY    . HERNIA REPAIR  07/15/2017   [facility]  . prolapse rectum surgery N/A July 2016  .  TONSILLECTOMY       A IV Location/Drains/Wounds Patient Lines/Drains/Airways Status   Active Line/Drains/Airways    Name:   Placement date:   Placement time:   Site:   Days:   Peripheral IV 11/16/18 Right;Posterior Forearm   11/16/18    2330    Forearm   1          Intake/Output Last 24 hours No intake or output data in the 24 hours ending 11/17/18 0214  Labs/Imaging Results for orders placed or performed during the hospital encounter of 11/16/18 (from the past 48 hour(s))  Urinalysis, Complete w Microscopic     Status: Abnormal   Collection Time: 11/16/18 10:41 PM  Result Value  Ref Range   Color, Urine YELLOW (A) YELLOW   APPearance HAZY (A) CLEAR   Specific Gravity, Urine 1.015 1.005 - 1.030   pH 5.0 5.0 - 8.0   Glucose, UA NEGATIVE NEGATIVE mg/dL   Hgb urine dipstick NEGATIVE NEGATIVE   Bilirubin Urine NEGATIVE NEGATIVE   Ketones, ur NEGATIVE NEGATIVE mg/dL   Protein, ur NEGATIVE NEGATIVE mg/dL   Nitrite NEGATIVE NEGATIVE   Leukocytes,Ua SMALL (A) NEGATIVE   RBC / HPF 0-5 0 - 5 RBC/hpf   WBC, UA 21-50 0 - 5 WBC/hpf   Bacteria, UA NONE SEEN NONE SEEN   Squamous Epithelial / LPF 0-5 0 - 5   WBC Clumps PRESENT    Hyaline Casts, UA PRESENT    Cellular Cast, UA PRESENT     Comment: Performed at [facility], [address] [place], [place] [address]  SARS Coronavirus 2 [facility] order, Performed in [facility] hospital lab) Nasopharyngeal Nasopharyngeal Swab     Status: None   Collection Time: 11/16/18 11:34 PM   Specimen: Nasopharyngeal Swab  Result Value Ref Range   SARS Coronavirus 2 NEGATIVE NEGATIVE    Comment: (NOTE) If result is NEGATIVE SARS-CoV-2 target nucleic acids are NOT DETECTED. The SARS-CoV-2 RNA is generally detectable in upper and lower  respiratory specimens during the acute phase of infection. The lowest  concentration of SARS-CoV-2 viral copies this assay can detect is 250  copies / mL. A negative result does not preclude SARS-CoV-2 infection  and should not be used as the sole basis for treatment or other  patient management decisions.  A negative result may occur with  improper specimen collection / handling, submission of specimen other  than nasopharyngeal swab, presence of viral mutation(s) within the  areas targeted by this assay, and inadequate number of viral copies  (<250 copies / mL). A negative result must be combined with clinical  observations, patient history, and epidemiological information. If result is POSITIVE SARS-CoV-2 target nucleic acids are DETECTED. The SARS-CoV-2 RNA is generally detectable in  upper and lower  respiratory specimens dur ing the acute phase of infection.  Positive  results are indicative of active infection with SARS-CoV-2.  Clinical  correlation with patient history and other diagnostic information is  necessary to determine patient infection status.  Positive results do  not rule out bacterial infection or co-infection with other viruses. If result is PRESUMPTIVE POSTIVE SARS-CoV-2 nucleic acids MAY BE PRESENT.   A presumptive positive result was obtained on the submitted specimen  and confirmed on repeat testing.  While 2019 novel coronavirus  (SARS-CoV-2) nucleic acids may be present in the submitted sample  additional confirmatory testing may be necessary for epidemiological  and / or clinical management purposes  to differentiate between  SARS-CoV-2 and other Sarbecovirus  currently known to infect humans.  If clinically indicated additional testing with an alternate test  methodology [phone]) is advised. The SARS-CoV-2 RNA is generally  detectable in upper and lower respiratory sp ecimens during the acute  phase of infection. The expected result is Negative. Fact Sheet for Patients:  StrictlyIdeas.no Fact Sheet for Healthcare Providers: BankingDealers.co.za This test is not yet approved or cleared by the [place] FDA and has been authorized for detection and/or diagnosis of SARS-CoV-2 by FDA under an Emergency Use Authorization (EUA).  This EUA will remain in effect (meaning this test can be used) for the duration of the COVID-19 declaration under Section 564(b)(1) of the Act, 21 U.S.C. section 360bbb-3(b)(1), unless the authorization is terminated or revoked sooner. Performed at [facility], [address] [place], [place] [address]   Lactic acid, plasma     Status: None   Collection Time: 11/17/18 12:05 AM  Result Value Ref Range   Lactic Acid, Venous 0.9 0.5 - 1.9 mmol/L     Comment: Performed at [facility], [address] [place], [place] [address]  CBC with Differential/Platelet     Status: Abnormal   Collection Time: 11/17/18 12:05 AM  Result Value Ref Range   WBC 5.3 4.0 - 10.5 K/uL   RBC 3.11 (L) 3.87 - 5.11 MIL/uL   Hemoglobin 9.8 (L) 12.0 - 15.0 g/dL   HCT 29.7 (L) 36.0 - 46.0 %   MCV 95.5 80.0 - 100.0 fL   MCH 31.5 26.0 - 34.0 pg   MCHC 33.0 30.0 - 36.0 g/dL   RDW 14.4 11.5 - 15.5 %   Platelets 150 150 - 400 K/uL   nRBC 0.0 0.0 - 0.2 %   Neutrophils Relative % 83 %   Neutro Abs 4.4 1.7 - 7.7 K/uL   Lymphocytes Relative 7 %   Lymphs Abs 0.4 (L) 0.7 - 4.0 K/uL   Monocytes Relative 6 %   Monocytes Absolute 0.3 0.1 - 1.0 K/uL   Eosinophils Relative 4 %   Eosinophils Absolute 0.2 0.0 - 0.5 K/uL   Basophils Relative 0 %   Basophils Absolute 0.0 0.0 - 0.1 K/uL   Immature Granulocytes 0 %   Abs Immature Granulocytes 0.01 0.00 - 0.07 K/uL    Comment: Performed at [facility], [address] [place], [place] [id number]  Basic metabolic panel     Status: Abnormal   Collection Time: 11/17/18 12:05 AM  Result Value Ref Range   Sodium 135 135 - 145 mmol/L   Potassium 4.2 3.5 - 5.1 mmol/L   Chloride 101 98 - 111 mmol/L   CO2 26 22 - 32 mmol/L   Glucose, Bld 116 (H) 70 - 99 mg/dL   BUN 49 (H) 6 - 20 mg/dL   Creatinine, Ser 2.36 (H) 0.44 - 1.00 mg/dL   Calcium 7.7 (L) 8.9 - 10.3 mg/dL   GFR calc non Af Amer 24 (L) >60 mL/min   GFR calc Af Amer 28 (L) >60 mL/min   Anion gap 8 5 - 15    Comment: Performed at [facility], [address] [place], [place] [address]  Hepatic function panel     Status: Abnormal   Collection Time: 11/17/18 12:05 AM  Result Value Ref Range   Total Protein 6.0 (L) 6.5 - 8.1 g/dL   Albumin 3.4 (L) 3.5 - 5.0 g/dL   AST 20 15 - 41 U/L   ALT 15 0 - 44 U/L   Alkaline  Phosphatase 69 38 - 126 U/L   Total Bilirubin 0.4 0.3 - 1.2 mg/dL   Bilirubin, Direct <0.1 0.0 - 0.2 mg/dL   Indirect  Bilirubin NOT CALCULATED 0.3 - 0.9 mg/dL    Comment: Performed at [facility], [address] [place], [place] [address]  Lipase, blood     Status: None   Collection Time: 11/17/18 12:05 AM  Result Value Ref Range   Lipase 39 11 - 51 U/L    Comment: Performed at [facility], [address] [place], [place] [address]   No results found.  Pending Labs [employer] (From admission, onward)    Start     Ordered   11/16/18 2319  Urine Culture  Add-on,   AD    Question:  Patient immune status  Answer:  Normal   11/16/18 2319   11/16/18 2222  Lactic acid, plasma  Now then every 2 hours,   STAT     11/16/18 2221   11/16/18 2221  CBC with Differential  ONCE - STAT,   STAT     11/16/18 2221   Signed and Held  TSH  Add-on,   R     Signed and Held          Vitals/Pain Today's Vitals   11/16/18 2300 11/16/18 2330 11/17/18 0000 11/17/18 0030  BP: (!) 131/114     Pulse: (!) 110 (!) 101 95 93  Resp: (!) 22 17 16 13   Temp:      TempSrc:      SpO2: 95% 92% 93% 90%  Weight:      Height:      PainSc:        Isolation Precautions No active isolations  Medications Medications  sodium chloride 0.9 % bolus 500 mL (has no administration in time range)  nitrofurantoin (macrocrystal-monohydrate) (MACROBID) capsule 100 mg (has no administration in time range)  sodium chloride 0.9 % bolus 500 mL (500 mLs Intravenous New Bag/Given 11/16/18 2332)    Mobility walks Low fall risk   Focused Assessments Cardiac Assessment Handoff:  Cardiac Rhythm: Sinus tachycardia Lab Results  Component Value Date   TROPONINI <0.03 06/08/2018   No results found for: DDIMER Does the Patient currently have chest pain? No     R Recommendations: See Admitting Provider Note  Report given to:   Additional Notes:

## 2018-11-17 NOTE — ED Notes (Signed)
Po fluids provided. Pt has approx 222mL left in first NS bolus to be administered.

## 2018-11-17 NOTE — Consult Note (Signed)
[address] [place], [place] [address] Phone [phone]. Fax [phone]  Date: 11/17/2018                  Patient Name:  Marie Clarke  MRN: [id number]  DOB: Sep 14, 1970  Age / Sex: 48 y.o., female         PCP: Sharyne Peach, MD                 Service Requesting Consult: IM/ Mayo, Pete Pelt, MD                 Reason for Consult: ARF            History of Present Illness: Patient is a 48 y.o. female with medical problems of hypertension, bipolar disorder, CHF, COPD, diabetes, sleep apnea, history of stroke who was admitted to [facility] on 11/16/2018 for evaluation of hypotension and lightheadedness.  Work-up in the emergency room shows increasing creatinine to 2.3 from a baseline of 0.79 on August 18 Patient had IV contrast exposure for CT Abdomen pelvis on 8/17.  Urinalysis on October 4 shows 21-50 WBCs When patient initially presented, she was thought to be hypovolemic therefore received some IV fluids Reports low-grade fever Denies any cough or shortness of breath Feels like her hands and legs are swollen  Medications: Outpatient medications: Medications Prior to Admission  Medication Sig Dispense Refill Last Dose  . aspirin EC 81 MG tablet Take 81 mg by mouth daily.    11/16/2018 at Unknown time  . atorvastatin (LIPITOR) 40 MG tablet Take 40 mg by mouth at bedtime.    Past Week at Unknown time  . busPIRone (BUSPAR) 10 MG tablet Take 2 tablets (20 mg total) by mouth 3 (three) times daily. 540 tablet 1 11/16/2018 at Unknown time  . divalproex (DEPAKOTE) 250 MG DR tablet TAKE 1 TABLET BY MOUTH 3 TIMES DAILY (Patient taking differently: Take 250 mg by mouth 2 (two) times daily. ) 90 tablet 1 11/16/2018 at Unknown time  . Dulaglutide 0.75 MG/0.5ML SOPN Inject 0.75 mg into the skin every Saturday.    Past Week at Unknown time  . KLOR-CON M20 20 MEQ tablet Take 20 mEq by mouth 2 (two) times daily.    11/16/2018 at Unknown time  . levocetirizine (XYZAL) 5 MG tablet Take 5  mg by mouth daily.    11/16/2018 at Unknown time  . levothyroxine (SYNTHROID) 50 MCG tablet Take 50 mcg by mouth daily. (256mcg total daily)   11/16/2018 at Unknown time  . levothyroxine (SYNTHROID, LEVOTHROID) 200 MCG tablet Take 200 mcg by mouth daily. (259mcg total daily)   11/16/2018 at Unknown time  . Magnesium Oxide 500 MG CAPS Take 1 capsule (500 mg total) by mouth 2 (two) times daily at 8 am and 10 pm. 60 capsule 5 11/16/2018 at Unknown time  . meloxicam (MOBIC) 15 MG tablet Take 1 tablet (15 mg total) by mouth daily with breakfast. Max: 1/day 30 tablet 5 11/16/2018 at Unknown time  . methocarbamol (ROBAXIN) 750 MG tablet Take 1 tablet (750 mg total) by mouth every 8 (eight) hours as needed for muscle spasms. 90 tablet 5 prn at prn  . montelukast (SINGULAIR) 10 MG tablet Take 10 mg by mouth daily.    11/16/2018 at Unknown time  . MYRBETRIQ 50 MG TB24 tablet Take 50 mg by mouth daily.    11/16/2018 at Unknown time  . nitroGLYCERIN (NITROSTAT) 0.4 MG SL tablet Place 0.4 mg under the tongue every  5 (five) minutes as needed for chest pain.    prn at prn  . omeprazole (PRILOSEC) 20 MG capsule Take 1 capsule (20 mg total) by mouth at bedtime. 30 capsule 5 Past Week at Unknown time  . ondansetron (ZOFRAN-ODT) 8 MG disintegrating tablet Take 8 mg by mouth every 8 (eight) hours as needed for nausea or vomiting.   prn at prn  . [START ON 11/19/2018] pregabalin (LYRICA) 150 MG capsule Take 1 capsule (150 mg total) by mouth 2 (two) times daily. 290 capsule 0 11/16/2018 at Unknown time  . propranolol (INDERAL) 20 MG tablet TAKE 1 TABLET BY MOUTH 3 TIMES A DAY AS NEEDED FOR ANXIETY (Patient taking differently: Take 20 mg by mouth 3 (three) times daily as needed (anxiety). ) 90 tablet 1 prn at prn  . QUEtiapine (SEROQUEL) 100 MG tablet Take 1-1.5 tablets (100-150 mg total) by mouth at bedtime as needed. (Patient taking differently: Take 100-150 mg by mouth at bedtime as needed (sleep). ) 135 tablet 0 prn at prn  .  rizatriptan (MAXALT) 10 MG tablet Take 1 tablet (10 mg total) by mouth as needed. (Patient taking differently: Take 10 mg by mouth as needed for migraine. ) 10 tablet 2 prn at prn  . torsemide (DEMADEX) 20 MG tablet Take 40 mg by mouth 2 (two) times daily.    11/16/2018 at Unknown time  . [START ON 12/17/2018] traMADol (ULTRAM) 50 MG tablet Take 1-2 tablets (50-100 mg total) by mouth every 6 (six) hours. 240 tablet 3 11/16/2018 at Unknown time  . TRELEGY ELLIPTA 100-62.5-25 MCG/INH AEPB Inhale 1 puff into the lungs daily.    11/16/2018 at Unknown time  . VENTOLIN HFA 108 (90 Base) MCG/ACT inhaler Inhale 1-2 puffs into the lungs every 6 (six) hours as needed for wheezing or shortness of breath.    prn at prn    Current medications: Current Facility-Administered Medications  Medication Dose Route Frequency Provider Last Rate Last Dose  . acetaminophen (TYLENOL) tablet 650 mg  650 mg Oral Q6H PRN Harrie Foreman, MD   650 mg at 11/17/18 1037   Or  . acetaminophen (TYLENOL) suppository 650 mg  650 mg Rectal Q6H PRN Harrie Foreman, MD      . docusate sodium (COLACE) capsule 100 mg  100 mg Oral BID Harrie Foreman, MD      . heparin injection 5,000 Units  5,000 Units Subcutaneous Q8H Harrie Foreman, MD      . insulin aspart (novoLOG) injection 0-5 Units  0-5 Units Subcutaneous QHS Harrie Foreman, MD      . insulin aspart (novoLOG) injection 0-9 Units  0-9 Units Subcutaneous TID WC Harrie Foreman, MD   1 Units at 11/17/18 [phone]  . mirabegron ER (MYRBETRIQ) tablet 50 mg  50 mg Oral Daily Harrie Foreman, MD   50 mg at 11/17/18 0855  . nitrofurantoin (macrocrystal-monohydrate) (MACROBID) capsule 100 mg  100 mg Oral Q12H Harrie Foreman, MD   100 mg at 11/17/18 0343  . ondansetron (ZOFRAN) tablet 4 mg  4 mg Oral Q6H PRN Harrie Foreman, MD       Or  . ondansetron [facility]) injection 4 mg  4 mg Intravenous Q6H PRN Harrie Foreman, MD   4 mg at 11/17/18 0359  . potassium  chloride SA (KLOR-CON) CR tablet 20 mEq  20 mEq Oral Once Mayo, Pete Pelt, MD          Allergies: Allergies  Allergen Reactions  . Diazepam Hives and Nausea And Vomiting  . Levofloxacin Hives and Rash    1/31: would like to retry since not taking valium  . Metronidazole Hives    1/31: would like to retry since she is no longer taking valium  . Sulfa Antibiotics Hives  . Cephalexin Hives  . Ciprofloxacin Hives  . Ziprasidone Hcl Other (See Comments)    CONFUSED CONFUSED Other reaction(s): Other (See Comments) CONFUSED CONFUSED   . Penicillins Nausea And Vomiting    Has patient had a PCN reaction causing immediate rash, facial/tongue/throat swelling, SOB or lightheadedness with hypotension: No Has patient had a PCN reaction causing severe rash involving mucus membranes or skin necrosis: No Has patient had a PCN reaction that required hospitalization: No Has patient had a PCN reaction occurring within the last 10 years: No If all of the above answers are "NO", then may proceed with Cephalosporin use.       Past Medical History: Past Medical History:  Diagnosis Date  . Anemia   . Anxiety   . Asthma   . Bipolar disorder ([place])   . CAD (coronary artery disease) unk  . CHF (congestive heart failure) ([place])   . COPD (chronic obstructive pulmonary disease) ([place])   . Depression unk  . Diabetes mellitus without complication ([place])   . Diabetes mellitus, type II ([place])   . Drug overdose   . GERD (gastroesophageal reflux disease)   . Headache   . Hyperlipidemia   . Hypertension   . Left leg pain 04/29/2014  . MI (myocardial infarction) ([place])   . Muscle ache 09/16/2014  . Osteoporosis   . Overactive bladder   . Pancreatitis unk  . Reflex sympathetic dystrophy   . Renal insufficiency   . Sleep apnea    pt reported on 2/6/7 she currently is not using CPAP  . Sleep apnea   . Stroke ([place])   . Thyroid disease   . TIA (transient ischemic attack) unk  . TIA (transient ischemic  attack)      Past Surgical History: Past Surgical History:  Procedure Laterality Date  . ABDOMINAL HYSTERECTOMY    . HERNIA REPAIR  07/15/2017   [facility]  . prolapse rectum surgery N/A July 2016  . TONSILLECTOMY       Family History: Family History  Problem Relation Age of Onset  . Diabetes Mellitus II Mother   . CAD Mother   . Sleep apnea Mother   . Osteoarthritis Mother   . Osteoporosis Mother   . Anxiety disorder Mother   . Depression Mother   . Bipolar disorder Mother   . Bipolar disorder Father   . Hypertension Father   . Depression Father   . Anxiety disorder Father   . Post-traumatic stress disorder Sister      Social History: Social History   Socioeconomic History  . Marital status: Single    Spouse name: Not on file  . Number of children: 0  . Years of education: Not on file  . Highest education level: High school graduate  Occupational History    Comment: not employed  Social Needs  . Financial resource strain: Not hard at all  . Food insecurity    Worry: Never true    Inability: Never true  . Transportation needs    Medical: Yes    Non-medical: Yes  Tobacco Use  . Smoking status: Former Smoker    Packs/day: 0.50    Types: Cigarettes  . Smokeless tobacco:  Never Used  Substance and Sexual Activity  . Alcohol use: No    Alcohol/week: 0.0 standard drinks  . Drug use: No  . Sexual activity: Not Currently  Lifestyle  . Physical activity    Days per week: 0 days    Minutes per session: 0 min  . Stress: Not at all  Relationships  . Social connections    Talks on phone: More than three times a week    Gets together: More than three times a week    Attends religious service: More than 4 times per year    Active member of club or organization: No    Attends meetings of clubs or organizations: Never    Relationship status: Never married  . Intimate partner violence    Fear of current or ex partner: No    Emotionally abused: No    Physically  abused: No    Forced sexual activity: No  Other Topics Concern  . Not on file  Social History Narrative  . Not on file     Review of Systems: Gen: Reports low-grade fevers, no chills HEENT: No vision or hearing problems CV: No chest pain or shortness of breath Resp: No cough or sputum production GI: No nausea vomiting or blood in the stool GU : No dysuria or hematuria MS: Denies any muscle aches or pains Derm:    No complaints Psych: No complaints Heme: No complaints Neuro: No complaints Endocrine.  No complaints  Vital Signs: Blood pressure (!) 86/49, pulse 86, temperature 98.1 F (36.7 C), temperature source Oral, resp. rate 20, height 5\' 4"  (1.626 m), weight 106.5 kg, SpO2 100 %.   Intake/Output Summary (Last 24 hours) at 11/17/2018 1121 Last data filed at 11/17/2018 0900 Gross per 24 hour  Intake 1175.03 ml  Output 1000 ml  Net 175.03 ml    Weight trends: [employer]   11/16/18 2211 11/16/18 2219 11/17/18 0251  Weight: 102.1 kg 102.1 kg 106.5 kg    Physical Exam: General:  No acute distress, lying in the bed  HEENT  anicteric, moist oral mucous membranes  Neck:  Supple, no masses  Lungs:  Clear to auscultation bilaterally  Heart::  Regular rate rhythm  Abdomen:  Soft, nontender  Extremities:  Puffiness, noncompressible edema  Neurologic:  Alert, oriented  Skin:  No acute rashes    Lab results: Basic Metabolic Panel: Recent Labs  Lab 11/17/18 0005 11/17/18 0330  NA 135 136  K 4.2 3.3*  CL 101 101  CO2 26 26  GLUCOSE 116* 136*  BUN 49* 49*  CREATININE 2.36* 2.35*  CALCIUM 7.7* 7.6*    Liver Function Tests: Recent Labs  Lab 11/17/18 0005  AST 20  ALT 15  ALKPHOS 69  BILITOT 0.4  PROT 6.0*  ALBUMIN 3.4*   Recent Labs  Lab 11/17/18 0005  LIPASE 39   No results for input(s): AMMONIA in the last 168 hours.  CBC: Recent Labs  Lab 11/17/18 0005 11/17/18 0330  WBC 5.3 4.8  NEUTROABS 4.4  --   HGB 9.8* 9.6*  HCT 29.7* 30.7*   MCV 95.5 98.1  PLT 150 139*    Cardiac Enzymes: No results for input(s): CKTOTAL, TROPONINI in the last 168 hours.  BNP: Invalid input(s): POCBNP  CBG: Recent Labs  Lab 11/17/18 0247 11/17/18 0745  GLUCAP 155* 147*    Microbiology: Recent Results (from the past 720 hour(s))  SARS Coronavirus 2 [facility] order, Performed in [facility] hospital lab) Nasopharyngeal Nasopharyngeal  Swab     Status: None   Collection Time: 11/16/18 11:34 PM   Specimen: Nasopharyngeal Swab  Result Value Ref Range Status   SARS Coronavirus 2 NEGATIVE NEGATIVE Final    Comment: (NOTE) If result is NEGATIVE SARS-CoV-2 target nucleic acids are NOT DETECTED. The SARS-CoV-2 RNA is generally detectable in upper and lower  respiratory specimens during the acute phase of infection. The lowest  concentration of SARS-CoV-2 viral copies this assay can detect is 250  copies / mL. A negative result does not preclude SARS-CoV-2 infection  and should not be used as the sole basis for treatment or other  patient management decisions.  A negative result may occur with  improper specimen collection / handling, submission of specimen other  than nasopharyngeal swab, presence of viral mutation(s) within the  areas targeted by this assay, and inadequate number of viral copies  (<250 copies / mL). A negative result must be combined with clinical  observations, patient history, and epidemiological information. If result is POSITIVE SARS-CoV-2 target nucleic acids are DETECTED. The SARS-CoV-2 RNA is generally detectable in upper and lower  respiratory specimens dur ing the acute phase of infection.  Positive  results are indicative of active infection with SARS-CoV-2.  Clinical  correlation with patient history and other diagnostic information is  necessary to determine patient infection status.  Positive results do  not rule out bacterial infection or co-infection with other viruses. If result is PRESUMPTIVE  POSTIVE SARS-CoV-2 nucleic acids MAY BE PRESENT.   A presumptive positive result was obtained on the submitted specimen  and confirmed on repeat testing.  While 2019 novel coronavirus  (SARS-CoV-2) nucleic acids may be present in the submitted sample  additional confirmatory testing may be necessary for epidemiological  and / or clinical management purposes  to differentiate between  SARS-CoV-2 and other Sarbecovirus currently known to infect humans.  If clinically indicated additional testing with an alternate test  methodology [phone]) is advised. The SARS-CoV-2 RNA is generally  detectable in upper and lower respiratory sp ecimens during the acute  phase of infection. The expected result is Negative. Fact Sheet for Patients:  StrictlyIdeas.no Fact Sheet for Healthcare Providers: BankingDealers.co.za This test is not yet approved or cleared by the [place] FDA and has been authorized for detection and/or diagnosis of SARS-CoV-2 by FDA under an Emergency Use Authorization (EUA).  This EUA will remain in effect (meaning this test can be used) for the duration of the COVID-19 declaration under Section 564(b)(1) of the Act, 21 U.S.C. section 360bbb-3(b)(1), unless the authorization is terminated or revoked sooner. Performed at [facility], [address] [place], [place] [address]      Coagulation Studies: No results for input(s): LABPROT, INR in the last 72 hours.  Urinalysis: Recent Labs    11/16/18 2241  COLORURINE YELLOW*  LABSPEC 1.015  PHURINE 5.0  GLUCOSEU NEGATIVE  HGBUR NEGATIVE  BILIRUBINUR NEGATIVE  KETONESUR NEGATIVE  PROTEINUR NEGATIVE  NITRITE NEGATIVE  LEUKOCYTESUR SMALL*        Imaging:  No results found.   Assessment & Plan: Pt is a 48 y.o. Caucasian   female with diabetes, congestive heart failure, coronary disease, hypertension, hyperlipidemia COPD, bipolar disorder was admitted  on 11/16/2018 with lightheadedness and is found to have acute kidney injury.   1.  Acute kidney injury Differential diagnoses include contrast nephropathy due to IV contrast exposure on August 17.  Other differential includes acute interstitial nephritis as patient is noted to have pyuria.  Hypotension could be playing a role in acute kidney injury.  Other differential includes ACE inhibitor in setting of diuretics.  Renal ultrasound done today is negative/unremarkable Urinalysis is negative for protein and blood  Plan: Urine culture Hold ,ACE inhibitor, diuretic -Agree with antibiotics to empirically treat UTI  2. Hypokalemia - replace as necesary    LOS: 0 Money Mckeithan 10/5/202011:21 AM    Note: This note was prepared with Dragon dictation. Any transcription errors are unintentional

## 2018-11-17 NOTE — Progress Notes (Signed)
Patient refused bed alarm. Educated patient on fall risks.   Fuller Mandril, RN

## 2018-11-17 NOTE — ED Notes (Signed)
First phlebotomist was unable to obtain lab work, second phlebotomist in to attempt venipucture.

## 2018-11-18 ENCOUNTER — Telehealth: Payer: Self-pay | Admitting: Student in an Organized Health Care Education/Training Program

## 2018-11-18 DIAGNOSIS — N179 Acute kidney failure, unspecified: Secondary | ICD-10-CM | POA: Diagnosis not present

## 2018-11-18 LAB — BASIC METABOLIC PANEL
Anion gap: 4 — ABNORMAL LOW (ref 5–15)
BUN: 18 mg/dL (ref 6–20)
CO2: 25 mmol/L (ref 22–32)
Calcium: 8.2 mg/dL — ABNORMAL LOW (ref 8.9–10.3)
Chloride: 108 mmol/L (ref 98–111)
Creatinine, Ser: 0.78 mg/dL (ref 0.44–1.00)
GFR calc Af Amer: >60 mL/min (ref >60)
GFR calc non Af Amer: >60 mL/min (ref >60)
Glucose, Bld: 93 mg/dL (ref 70–99)
Potassium: 4.3 mmol/L (ref 3.5–5.1)
Sodium: 137 mmol/L (ref 135–145)

## 2018-11-18 LAB — CBC
HCT: 31.4 % — ABNORMAL LOW (ref 36.0–46.0)
Hemoglobin: 10.1 g/dL — ABNORMAL LOW (ref 12.0–15.0)
MCH: 30.3 pg (ref 26.0–34.0)
MCHC: 32.2 g/dL (ref 30.0–36.0)
MCV: 94.3 fL (ref 80.0–100.0)
Platelets: 154 10*3/uL (ref 150–400)
RBC: 3.33 MIL/uL — ABNORMAL LOW (ref 3.87–5.11)
RDW: 14.1 % (ref 11.5–15.5)
WBC: 3.5 10*3/uL — ABNORMAL LOW (ref 4.0–10.5)
nRBC: 0 % (ref 0.0–0.2)

## 2018-11-18 LAB — GLUCOSE, CAPILLARY
Glucose-Capillary: 125 mg/dL — ABNORMAL HIGH (ref 70–99)
Glucose-Capillary: 125 mg/dL — ABNORMAL HIGH (ref 70–99)
Glucose-Capillary: 102 mg/dL — ABNORMAL HIGH (ref 70–99)

## 2018-11-18 LAB — CORTISOL-AM, BLOOD: Cortisol - AM: 6 ug/dL — ABNORMAL LOW (ref 6.7–22.6)

## 2018-11-18 MED ORDER — ALBUTEROL SULFATE (2.5 MG/3ML) 0.083% IN NEBU: 2.5000 mg | INHALATION_SOLUTION | Freq: Four times a day (QID) | RESPIRATORY_TRACT | Status: DC | PRN

## 2018-11-18 MED ORDER — FLUTICASONE-UMECLIDIN-VILANT 100-62.5-25 MCG/INH IN AEPB: 1.0000 | INHALATION_SPRAY | Freq: Every day | RESPIRATORY_TRACT | Status: DC

## 2018-11-18 MED ORDER — DIPHENHYDRAMINE HCL 25 MG PO CAPS: 25.0000 mg | ORAL_CAPSULE | Freq: Three times a day (TID) | ORAL | Status: DC | PRN

## 2018-11-18 MED ORDER — KLOR-CON M20 20 MEQ PO TBCR: 10.0000 meq | EXTENDED_RELEASE_TABLET | Freq: Two times a day (BID) | ORAL | 0 refills | Status: DC

## 2018-11-18 MED ORDER — FLUTICASONE FUROATE-VILANTEROL 100-25 MCG/INH IN AEPB
1.0000 | INHALATION_SPRAY | Freq: Every day | RESPIRATORY_TRACT | Status: DC
  Administered 2018-11-18: 1 via RESPIRATORY_TRACT
  Filled 2018-11-18: qty 28

## 2018-11-18 MED ORDER — ALBUTEROL SULFATE HFA 108 (90 BASE) MCG/ACT IN AERS: 1.0000 | INHALATION_SPRAY | Freq: Four times a day (QID) | RESPIRATORY_TRACT | Status: DC | PRN

## 2018-11-18 MED ORDER — METHOCARBAMOL 750 MG PO TABS
750.0000 mg | ORAL_TABLET | Freq: Three times a day (TID) | ORAL | Status: DC | PRN
  Filled 2018-11-18: qty 1

## 2018-11-18 MED ORDER — UMECLIDINIUM BROMIDE 62.5 MCG/INH IN AEPB
1.0000 | INHALATION_SPRAY | Freq: Every day | RESPIRATORY_TRACT | Status: DC
  Administered 2018-11-18: 1 via RESPIRATORY_TRACT
  Filled 2018-11-18: qty 7

## 2018-11-18 MED ORDER — TORSEMIDE 20 MG PO TABS: 20.0000 mg | ORAL_TABLET | Freq: Two times a day (BID) | ORAL | 0 refills | Status: DC

## 2018-11-18 MED ORDER — TORSEMIDE 20 MG PO TABS
20.0000 mg | ORAL_TABLET | Freq: Two times a day (BID) | ORAL | Status: DC
  Administered 2018-11-18: 20 mg via ORAL
  Filled 2018-11-18 (×2): qty 1

## 2018-11-18 MED ORDER — ATORVASTATIN CALCIUM 20 MG PO TABS: 40.0000 mg | ORAL_TABLET | Freq: Every day | ORAL | Status: DC

## 2018-11-18 MED ORDER — PREGABALIN 75 MG PO CAPS
150.0000 mg | ORAL_CAPSULE | Freq: Two times a day (BID) | ORAL | Status: DC
  Administered 2018-11-18: 150 mg via ORAL
  Filled 2018-11-18: qty 2

## 2018-11-18 MED ORDER — ASPIRIN EC 81 MG PO TBEC
81.0000 mg | DELAYED_RELEASE_TABLET | Freq: Every day | ORAL | Status: DC
  Administered 2018-11-18: 81 mg via ORAL
  Filled 2018-11-18: qty 1

## 2018-11-18 MED ORDER — PROPRANOLOL HCL 20 MG PO TABS
20.0000 mg | ORAL_TABLET | Freq: Three times a day (TID) | ORAL | Status: DC | PRN
  Filled 2018-11-18: qty 1

## 2018-11-18 MED ORDER — MONTELUKAST SODIUM 10 MG PO TABS
10.0000 mg | ORAL_TABLET | Freq: Every day | ORAL | Status: DC
  Administered 2018-11-18: 10 mg via ORAL
  Filled 2018-11-18: qty 1

## 2018-11-18 MED ORDER — QUETIAPINE FUMARATE 25 MG PO TABS: 100.0000 mg | ORAL_TABLET | Freq: Every evening | ORAL | Status: DC | PRN

## 2018-11-18 MED ORDER — BUSPIRONE HCL 10 MG PO TABS
20.0000 mg | ORAL_TABLET | Freq: Three times a day (TID) | ORAL | Status: DC
  Administered 2018-11-18: 20 mg via ORAL
  Filled 2018-11-18 (×3): qty 2

## 2018-11-18 MED ORDER — PANTOPRAZOLE SODIUM 40 MG PO TBEC
40.0000 mg | DELAYED_RELEASE_TABLET | Freq: Every day | ORAL | Status: DC
  Administered 2018-11-18: 40 mg via ORAL
  Filled 2018-11-18: qty 1

## 2018-11-18 NOTE — Progress Notes (Signed)
Marie Clarke to be D/C'd home per MD order.  Discussed prescriptions and follow up appointments with the patient. Prescriptions given to patient, medication list explained in detail. Pt verbalized understanding.  Allergies as of 11/18/2018      Reactions   Diazepam Hives, Nausea And Vomiting   Levofloxacin Hives, Rash   1/31: would like to retry since not taking valium   Metronidazole Hives   1/31: would like to retry since she is no longer taking valium   Sulfa Antibiotics Hives   Cephalexin Hives   Ciprofloxacin Hives   Ziprasidone Hcl Other (See Comments)   CONFUSED CONFUSED Other reaction(s): Other (See Comments) CONFUSED CONFUSED   Penicillins Nausea And Vomiting   Has patient had a PCN reaction causing immediate rash, facial/tongue/throat swelling, SOB or lightheadedness with hypotension: No Has patient had a PCN reaction causing severe rash involving mucus membranes or skin necrosis: No Has patient had a PCN reaction that required hospitalization: No Has patient had a PCN reaction occurring within the last 10 years: No If all of the above answers are "NO", then may proceed with Cephalosporin use.      Medication List    TAKE these medications   aspirin EC 81 MG tablet Take 81 mg by mouth daily.   atorvastatin 40 MG tablet Commonly known as: LIPITOR Take 40 mg by mouth at bedtime.   busPIRone 10 MG tablet Commonly known as: BUSPAR Take 2 tablets (20 mg total) by mouth 3 (three) times daily.   diphenhydrAMINE 25 mg capsule Commonly known as: BENADRYL TAKE 1 CAPSULE BY MOUTH TWICE DAILY AS NEEDED FOR ITCHING What changed: See the new instructions.   divalproex 250 MG DR tablet Commonly known as: DEPAKOTE TAKE 1 TABLET BY MOUTH 3 TIMES DAILY What changed: when to take this   Dulaglutide 0.75 MG/0.5ML Sopn Inject 0.75 mg into the skin every Saturday.   Klor-Con M20 20 MEQ tablet Generic drug: potassium chloride SA Take 0.5 tablets (10 mEq total) by mouth 2 (two)  times daily. What changed: how much to take   levocetirizine 5 MG tablet Commonly known as: XYZAL Take 5 mg by mouth daily.   levothyroxine 200 MCG tablet Commonly known as: SYNTHROID Take 200 mcg by mouth daily. (232mcg total daily)   levothyroxine 50 MCG tablet Commonly known as: SYNTHROID Take 50 mcg by mouth daily. (246mcg total daily)   Magnesium Oxide 500 MG Caps Take 1 capsule (500 mg total) by mouth 2 (two) times daily at 8 am and 10 pm.   methocarbamol 750 MG tablet Commonly known as: ROBAXIN Take 1 tablet (750 mg total) by mouth every 8 (eight) hours as needed for muscle spasms.   montelukast 10 MG tablet Commonly known as: SINGULAIR Take 10 mg by mouth daily.   Myrbetriq 50 MG Tb24 tablet Generic drug: mirabegron ER Take 50 mg by mouth daily.   nitroGLYCERIN 0.4 MG SL tablet Commonly known as: NITROSTAT Place 0.4 mg under the tongue every 5 (five) minutes as needed for chest pain.   omeprazole 20 MG capsule Commonly known as: PRILOSEC Take 1 capsule (20 mg total) by mouth at bedtime.   ondansetron 8 MG disintegrating tablet Commonly known as: ZOFRAN-ODT Take 8 mg by mouth every 8 (eight) hours as needed for nausea or vomiting.   pregabalin 150 MG capsule Commonly known as: Lyrica Take 1 capsule (150 mg total) by mouth 2 (two) times daily. Start taking on: November 19, 2018   propranolol 20 MG tablet Commonly  known as: INDERAL TAKE 1 TABLET BY MOUTH 3 TIMES A DAY AS NEEDED FOR ANXIETY What changed: See the new instructions.   QUEtiapine 100 MG tablet Commonly known as: SEROquel Take 1-1.5 tablets (100-150 mg total) by mouth at bedtime as needed. What changed: reasons to take this   rizatriptan 10 MG tablet Commonly known as: MAXALT Take 1 tablet (10 mg total) by mouth as needed. What changed: reasons to take this   torsemide 20 MG tablet Commonly known as: DEMADEX Take 1 tablet (20 mg total) by mouth 2 (two) times daily. What changed: how much  to take   traMADol 50 MG tablet Commonly known as: ULTRAM Take 1-2 tablets (50-100 mg total) by mouth every 6 (six) hours. Start taking on: December 17, 2018   Trelegy Ellipta 100-62.5-25 MCG/INH Aepb Generic drug: Fluticasone-Umeclidin-Vilant Inhale 1 puff into the lungs daily.   Ventolin HFA 108 (90 Base) MCG/ACT inhaler Generic drug: albuterol Inhale 1-2 puffs into the lungs every 6 (six) hours as needed for wheezing or shortness of breath.       Vitals:   11/17/18 2040 11/18/18 0628  BP: 92/60 113/84  Pulse: (!) 104 93  Resp: 20 20  Temp: 98.5 F (36.9 C) 97.9 F (36.6 C)  SpO2: 99% 96%    Skin clean, dry and intact without evidence of skin break down, no evidence of skin tears noted. IV catheter discontinued intact. Site without signs and symptoms of complications. Dressing and pressure applied. Pt denies pain at this time. No complaints noted.  An After Visit Summary was printed and given to the patient. Patient escorted via [place], and D/C home via private auto.  Chuck Hint RN [facility] [employer]

## 2018-11-18 NOTE — Telephone Encounter (Addendum)
Patient called to let Dr. Dossie Arbour know that taking the Mobic, Ibuprofen, and lycentipryl caused her kidney failure not the Tramadol. And her physician said she can resume the Tramadol.

## 2018-11-18 NOTE — Discharge Summary (Signed)
[facility] NAME: Marie Clarke    MR#:  [id number]  DATE OF BIRTH:  [birth year]  DATE OF ADMISSION:  11/16/2018   ADMITTING PHYSICIAN: Harrie Foreman, MD  DATE OF DISCHARGE: 11/18/18  PRIMARY CARE PHYSICIAN: Sharyne Peach, MD   ADMISSION DIAGNOSIS:  Hypotension, unspecified hypotension type [I95.9] Urinary tract infection without hematuria, site unspecified [N39.0] Acute renal failure, unspecified acute renal failure type ([place]) [N17.9] DISCHARGE DIAGNOSIS:  Active Problems:   AKI (acute kidney injury) ([place])  SECONDARY DIAGNOSIS:   Past Medical History:  Diagnosis Date  . Anemia   . Anxiety   . Asthma   . Bipolar disorder ([place])   . CAD (coronary artery disease) unk  . CHF (congestive heart failure) ([place])   . COPD (chronic obstructive pulmonary disease) ([place])   . Depression unk  . Diabetes mellitus without complication ([place])   . Diabetes mellitus, type II ([place])   . Drug overdose   . GERD (gastroesophageal reflux disease)   . Headache   . Hyperlipidemia   . Hypertension   . Left leg pain 04/29/2014  . MI (myocardial infarction) ([place])   . Muscle ache 09/16/2014  . Osteoporosis   . Overactive bladder   . Pancreatitis unk  . Reflex sympathetic dystrophy   . Renal insufficiency   . Sleep apnea    pt reported on 2/6/7 she currently is not using CPAP  . Sleep apnea   . Stroke ([place])   . Thyroid disease   . TIA (transient ischemic attack) unk  . TIA (transient ischemic attack)    HOSPITAL COURSE:   Marie Clarke is a 48 year old female who presented to the ED with lightheadedness and fatigue.  In the ED, she was found to be mildly hypotensive with systolic blood pressures in the 90s.  She was also noted to have an AKI.  She was admitted for further management.  AKI- resolved.  Creatinine returned back to baseline on the day of discharge.  AKI was likely multifactorial due to hypotension, overdiuresis, ACE, and NSAID use.  -Renal ultrasound was unremarkable -Nephrology was consulted -Lisinopril was stopped -Patient was advised to stop taking meloxicam and ibuprofen -Home torsemide dose was decreased from 40 mg twice daily to 20 mg twice daily -Needs BMP rechecked as an outpatient  Chronic diastolic congestive heart failure- patient does have some bilateral lower extremity edema, but this may be due to chronic lymphedema. -BNP was 2 5 -Recent ECHO 09/2018 with impaired relaxation and normal EF -Home torsemide dose was decreased to 20 mg twice daily and home K-Dur dose was decreased to 10 mEq twice daily -Advised that patient use compression stockings at home and keep legs elevated as much as possible  Hypertension- BP was low-normal throughout admission. -Lisinopril was discontinued -Needs to follow-up with PCP for BP control  Type 2 diabetes-stable -Not on any diabetes medicines at home  Hyperlipidemia-stable -Home Lipitor was continued  UTI- patient was being treated for this as an outpatient. -Repeat urine culture with insignificant growth -Macrobid was discontinued  Hypothyroidism-stable -TSH was normal this admission -Home Synthroid was continued  DISCHARGE CONDITIONS:  Chronic diastolic congestive heart failure Hypertension Type 2 diabetes Hyperlipidemia Hypothyroidism CONSULTS OBTAINED:  Nephrology DRUG ALLERGIES:   Allergies  Allergen Reactions  . Diazepam Hives and Nausea And Vomiting  . Levofloxacin Hives and Rash    1/31: would like to retry since not taking valium  . Metronidazole Hives    1/31:  would like to retry since she is no longer taking valium  . Sulfa Antibiotics Hives  . Cephalexin Hives  . Ciprofloxacin Hives  . Ziprasidone Hcl Other (See Comments)    CONFUSED CONFUSED Other reaction(s): Other (See Comments) CONFUSED CONFUSED   . Penicillins Nausea And Vomiting    Has patient had a PCN reaction causing immediate rash, facial/tongue/throat swelling, SOB  or lightheadedness with hypotension: No Has patient had a PCN reaction causing severe rash involving mucus membranes or skin necrosis: No Has patient had a PCN reaction that required hospitalization: No Has patient had a PCN reaction occurring within the last 10 years: No If all of the above answers are "NO", then may proceed with Cephalosporin use.    DISCHARGE MEDICATIONS:   Allergies as of 11/18/2018      Reactions   Diazepam Hives, Nausea And Vomiting   Levofloxacin Hives, Rash   1/31: would like to retry since not taking valium   Metronidazole Hives   1/31: would like to retry since she is no longer taking valium   Sulfa Antibiotics Hives   Cephalexin Hives   Ciprofloxacin Hives   Ziprasidone Hcl Other (See Comments)   CONFUSED CONFUSED Other reaction(s): Other (See Comments) CONFUSED CONFUSED   Penicillins Nausea And Vomiting   Has patient had a PCN reaction causing immediate rash, facial/tongue/throat swelling, SOB or lightheadedness with hypotension: No Has patient had a PCN reaction causing severe rash involving mucus membranes or skin necrosis: No Has patient had a PCN reaction that required hospitalization: No Has patient had a PCN reaction occurring within the last 10 years: No If all of the above answers are "NO", then may proceed with Cephalosporin use.      Medication List    TAKE these medications   aspirin EC 81 MG tablet Take 81 mg by mouth daily.   atorvastatin 40 MG tablet Commonly known as: LIPITOR Take 40 mg by mouth at bedtime.   busPIRone 10 MG tablet Commonly known as: BUSPAR Take 2 tablets (20 mg total) by mouth 3 (three) times daily.   diphenhydrAMINE 25 mg capsule Commonly known as: BENADRYL TAKE 1 CAPSULE BY MOUTH TWICE DAILY AS NEEDED FOR ITCHING What changed: See the new instructions.   divalproex 250 MG DR tablet Commonly known as: DEPAKOTE TAKE 1 TABLET BY MOUTH 3 TIMES DAILY What changed: when to take this   Dulaglutide 0.75  MG/0.5ML Sopn Inject 0.75 mg into the skin every Saturday.   Klor-Con M20 20 MEQ tablet Generic drug: potassium chloride SA Take 0.5 tablets (10 mEq total) by mouth 2 (two) times daily. What changed: how much to take   levocetirizine 5 MG tablet Commonly known as: XYZAL Take 5 mg by mouth daily.   levothyroxine 200 MCG tablet Commonly known as: SYNTHROID Take 200 mcg by mouth daily. (228mcg total daily)   levothyroxine 50 MCG tablet Commonly known as: SYNTHROID Take 50 mcg by mouth daily. (233mcg total daily)   Magnesium Oxide 500 MG Caps Take 1 capsule (500 mg total) by mouth 2 (two) times daily at 8 am and 10 pm.   methocarbamol 750 MG tablet Commonly known as: ROBAXIN Take 1 tablet (750 mg total) by mouth every 8 (eight) hours as needed for muscle spasms.   montelukast 10 MG tablet Commonly known as: SINGULAIR Take 10 mg by mouth daily.   Myrbetriq 50 MG Tb24 tablet Generic drug: mirabegron ER Take 50 mg by mouth daily.   nitroGLYCERIN 0.4 MG SL tablet Commonly  known as: NITROSTAT Place 0.4 mg under the tongue every 5 (five) minutes as needed for chest pain.   omeprazole 20 MG capsule Commonly known as: PRILOSEC Take 1 capsule (20 mg total) by mouth at bedtime.   ondansetron 8 MG disintegrating tablet Commonly known as: ZOFRAN-ODT Take 8 mg by mouth every 8 (eight) hours as needed for nausea or vomiting.   pregabalin 150 MG capsule Commonly known as: Lyrica Take 1 capsule (150 mg total) by mouth 2 (two) times daily. Start taking on: November 19, 2018   propranolol 20 MG tablet Commonly known as: INDERAL TAKE 1 TABLET BY MOUTH 3 TIMES A DAY AS NEEDED FOR ANXIETY What changed: See the new instructions.   QUEtiapine 100 MG tablet Commonly known as: SEROquel Take 1-1.5 tablets (100-150 mg total) by mouth at bedtime as needed. What changed: reasons to take this   rizatriptan 10 MG tablet Commonly known as: MAXALT Take 1 tablet (10 mg total) by mouth as  needed. What changed: reasons to take this   torsemide 20 MG tablet Commonly known as: DEMADEX Take 1 tablet (20 mg total) by mouth 2 (two) times daily. What changed: how much to take   traMADol 50 MG tablet Commonly known as: ULTRAM Take 1-2 tablets (50-100 mg total) by mouth every 6 (six) hours. Start taking on: December 17, 2018   Trelegy Ellipta 100-62.5-25 MCG/INH Aepb Generic drug: Fluticasone-Umeclidin-Vilant Inhale 1 puff into the lungs daily.   Ventolin HFA 108 (90 Base) MCG/ACT inhaler Generic drug: albuterol Inhale 1-2 puffs into the lungs every 6 (six) hours as needed for wheezing or shortness of breath.        DISCHARGE INSTRUCTIONS:  1.  Follow-up with PCP in 5 days 2.  Stop meloxicam, ibuprofen, and lisinopril 3.  Decrease torsemide dose to 20 mg twice daily and decrease K-Dur dose to 10 mEq twice daily DIET:  Cardiac diet and Diabetic diet DISCHARGE CONDITION:  Stable ACTIVITY:  Activity as tolerated OXYGEN:  Home Oxygen: No.  Oxygen Delivery: room air DISCHARGE LOCATION:  home   If you experience worsening of your admission symptoms, develop shortness of breath, life threatening emergency, suicidal or homicidal thoughts you must seek medical attention immediately by calling 911 or calling your MD immediately  if symptoms less severe.  You Must read complete instructions/literature along with all the possible adverse reactions/side effects for all the Medicines you take and that have been prescribed to you. Take any new Medicines after you have completely understood and accpet all the possible adverse reactions/side effects.   Please note  You were cared for by a hospitalist during your hospital stay. If you have any questions about your discharge medications or the care you received while you were in the hospital after you are discharged, you can call the unit and asked to speak with the hospitalist on call if the hospitalist that took care of you is not  available. Once you are discharged, your primary care physician will handle any further medical issues. Please note that NO REFILLS for any discharge medications will be authorized once you are discharged, as it is imperative that you return to your primary care physician (or establish a relationship with a primary care physician if you do not have one) for your aftercare needs so that they can reassess your need for medications and monitor your lab values.    On the day of Discharge:  VITAL SIGNS:  Blood pressure 113/84, pulse 93, temperature 97.9 F (36.6 C),  temperature source Oral, resp. rate 20, height 5\' 4"  (1.626 m), weight 100.5 kg, SpO2 96 %. PHYSICAL EXAMINATION:  GENERAL:  48 y.o.-year-old patient sitting up in the chair in no acute distress.  Well-appearing. EYES: Pupils equal, round, reactive to light and accommodation. No scleral icterus. Extraocular muscles intact.  HEENT: Head atraumatic, normocephalic. Oropharynx and nasopharynx clear.  NECK:  Supple, no jugular venous distention. No thyroid enlargement, no tenderness.  LUNGS: Normal breath sounds bilaterally, no wheezing, rales,rhonchi or crepitation. No use of accessory muscles of respiration.  CARDIOVASCULAR: RRR, S1, S2 normal. No murmurs, rubs, or gallops.  ABDOMEN: Soft, non-tender, non-distended. Bowel sounds present. No organomegaly or mass.  EXTREMITIES: No cyanosis, or clubbing. 1-2+ pitting edema in the lower extremities bilaterally. NEUROLOGIC: Cranial nerves II through XII are intact. Muscle strength 5/5 in all extremities. Sensation intact. Gait not checked.  PSYCHIATRIC: The patient is alert and oriented x 3.  SKIN: No obvious rash, lesion, or ulcer.  DATA REVIEW:   CBC Recent Labs  Lab 11/18/18 0553  WBC 3.5*  HGB 10.1*  HCT 31.4*  PLT 154    Chemistries  Recent Labs  Lab 11/17/18 0005  11/18/18 0447  NA 135   < > 137  K 4.2   < > 4.3  CL 101   < > 108  CO2 26   < > 25  GLUCOSE 116*   < > 93   BUN 49*   < > 18  CREATININE 2.36*   < > 0.78  CALCIUM 7.7*   < > 8.2*  AST 20  --   --   ALT 15  --   --   ALKPHOS 69  --   --   BILITOT 0.4  --   --    < > = values in this interval not displayed.     Microbiology Results  Results for orders placed or performed during the hospital encounter of 11/16/18  Urine Culture     Status: Abnormal   Collection Time: 11/16/18 10:41 PM   Specimen: Urine, Random  Result Value Ref Range Status   Specimen Description   Final    URINE, RANDOM Performed at [facility], [address] [place], [place] [address]    Special Requests   Final    Normal Performed at [facility], [address] [place], [place] [address]    Culture (A)  Final    <10,000 COLONIES/mL INSIGNIFICANT GROWTH Performed at [facility], [place] [address], [place], [place] [address]    Report Status 11/17/2018 FINAL  Final  SARS Coronavirus 2 [facility] order, Performed in [facility] hospital lab) Nasopharyngeal Nasopharyngeal Swab     Status: None   Collection Time: 11/16/18 11:34 PM   Specimen: Nasopharyngeal Swab  Result Value Ref Range Status   SARS Coronavirus 2 NEGATIVE NEGATIVE Final    Comment: (NOTE) If result is NEGATIVE SARS-CoV-2 target nucleic acids are NOT DETECTED. The SARS-CoV-2 RNA is generally detectable in upper and lower  respiratory specimens during the acute phase of infection. The lowest  concentration of SARS-CoV-2 viral copies this assay can detect is 250  copies / mL. A negative result does not preclude SARS-CoV-2 infection  and should not be used as the sole basis for treatment or other  patient management decisions.  A negative result may occur with  improper specimen collection / handling, submission of specimen other  than nasopharyngeal swab, presence of viral mutation(s) within the  areas  targeted by this assay, and inadequate number of viral copies  (<250 copies / mL). A negative result must be combined  with clinical  observations, patient history, and epidemiological information. If result is POSITIVE SARS-CoV-2 target nucleic acids are DETECTED. The SARS-CoV-2 RNA is generally detectable in upper and lower  respiratory specimens dur ing the acute phase of infection.  Positive  results are indicative of active infection with SARS-CoV-2.  Clinical  correlation with patient history and other diagnostic information is  necessary to determine patient infection status.  Positive results do  not rule out bacterial infection or co-infection with other viruses. If result is PRESUMPTIVE POSTIVE SARS-CoV-2 nucleic acids MAY BE PRESENT.   A presumptive positive result was obtained on the submitted specimen  and confirmed on repeat testing.  While 2019 novel coronavirus  (SARS-CoV-2) nucleic acids may be present in the submitted sample  additional confirmatory testing may be necessary for epidemiological  and / or clinical management purposes  to differentiate between  SARS-CoV-2 and other Sarbecovirus currently known to infect humans.  If clinically indicated additional testing with an alternate test  methodology [phone]) is advised. The SARS-CoV-2 RNA is generally  detectable in upper and lower respiratory sp ecimens during the acute  phase of infection. The expected result is Negative. Fact Sheet for Patients:  StrictlyIdeas.no Fact Sheet for Healthcare Providers: BankingDealers.co.za This test is not yet approved or cleared by the [place] FDA and has been authorized for detection and/or diagnosis of SARS-CoV-2 by FDA under an Emergency Use Authorization (EUA).  This EUA will remain in effect (meaning this test can be used) for the duration of the COVID-19 declaration under Section 564(b)(1) of the Act, 21 U.S.C. section 360bbb-3(b)(1), unless the authorization is terminated or revoked sooner. Performed at [facility], [address] [place], [place] [address]     RADIOLOGY:  US Renal  Result Date: 11/17/2018 CLINICAL DATA:  Acute renal injury EXAM: RENAL / URINARY TRACT ULTRASOUND COMPLETE COMPARISON:  None. FINDINGS: Right Kidney: Renal measurements: 12.2 x 5.3 x 5.7 cm. = volume: 192 mL . Echogenicity within normal limits. No mass or hydronephrosis visualized. Left Kidney: Renal measurements: 12.0 x 6.2 x 5.4 cm. = volume: 208 mL. Echogenicity within normal limits. No mass or hydronephrosis visualized. Bladder: Appears normal for degree of bladder distention. IMPRESSION: Normal-appearing kidneys. Electronically Signed   By: Inez Catalina M.D.   On: 11/17/2018 13:54     Management plans discussed with the patient, family and they are in agreement.  CODE STATUS: Full Code   TOTAL TIME TAKING CARE OF THIS PATIENT: 45 minutes.    Berna Spare Mayo M.D on 11/18/2018 at 9:59 AM  Between 7am to 6pm - Pager - [phone]  After 6pm go to [email or web address] - Proofreader  [facility]  Office  [phone]  CC: Primary care physician; Sharyne Peach, MD   Note: This dictation was prepared with Dragon dictation along with smaller phrase technology. Any transcriptional errors that result from this process are unintentional.

## 2018-11-18 NOTE — Progress Notes (Signed)
[facility], [place] 11/18/18  Subjective:   Patient is doing better Serum creatinine is back to normal Able to eat without any nausea or vomiting  Objective:  Vital signs in last 24 hours:  Temp:  [97.9 F (36.6 C)-98.5 F (36.9 C)] 97.9 F (36.6 C) (10/06 0628) Pulse Rate:  [93-105] 93 (10/06 0628) Resp:  [17-20] 20 (10/06 0628) BP: (92-113)/(60-84) 113/84 (10/06 0628) SpO2:  [95 %-99 %] 96 % (10/06 0628) Weight:  [100.5 kg] 100.5 kg (10/06 0500)  Weight change: -1.588 kg Filed Weights   11/16/18 2219 11/17/18 0251 11/18/18 0500  Weight: 102.1 kg 106.5 kg 100.5 kg    Intake/Output:    Intake/Output Summary (Last 24 hours) at 11/18/2018 1033 Last data filed at 11/18/2018 0909 Gross per 24 hour  Intake 600 ml  Output 1100 ml  Net -500 ml     Physical Exam: General:  No acute distress, ambulatory in room  HEENT  moist oral mucous membranes  Pulm/lungs  normal breathing effort on room air, clear to auscultation  CVS/Heart  regular rhythm, no rub  Abdomen:   Soft, nontender  Extremities:  No edema  Neurologic:  Alert and oriented  Skin:  No acute rashes    Basic Metabolic Panel:  Recent Labs  Lab 11/17/18 0005 11/17/18 0330 11/18/18 0447  NA 135 136 137  K 4.2 3.3* 4.3  CL 101 101 108  CO2 26 26 25   GLUCOSE 116* 136* 93  BUN 49* 49* 18  CREATININE 2.36* 2.35* 0.78  CALCIUM 7.7* 7.6* 8.2*     CBC: Recent Labs  Lab 11/17/18 0005 11/17/18 0330 11/18/18 0553  WBC 5.3 4.8 3.5*  NEUTROABS 4.4  --   --   HGB 9.8* 9.6* 10.1*  HCT 29.7* 30.7* 31.4*  MCV 95.5 98.1 94.3  PLT 150 139* 154     No results found for: HEPBSAG, HEPBSAB, HEPBIGM    Microbiology:  Recent Results (from the past 240 hour(s))  Urine Culture     Status: Abnormal   Collection Time: 11/16/18 10:41 PM   Specimen: Urine, Random  Result Value Ref Range Status   Specimen Description   Final    URINE, RANDOM Performed at [facility],  [address], [place], [place] [address]    Special Requests   Final    Normal Performed at [facility], [address] [place], [place] [address]    Culture (A)  Final    <10,000 COLONIES/mL INSIGNIFICANT GROWTH Performed at [facility], [place] [address] [place], [place] [address]    Report Status 11/17/2018 FINAL  Final  SARS Coronavirus 2 [facility] order, Performed in [facility] hospital lab) Nasopharyngeal Nasopharyngeal Swab     Status: None   Collection Time: 11/16/18 11:34 PM   Specimen: Nasopharyngeal Swab  Result Value Ref Range Status   SARS Coronavirus 2 NEGATIVE NEGATIVE Final    Comment: (NOTE) If result is NEGATIVE SARS-CoV-2 target nucleic acids are NOT DETECTED. The SARS-CoV-2 RNA is generally detectable in upper and lower  respiratory specimens during the acute phase of infection. The lowest  concentration of SARS-CoV-2 viral copies this assay can detect is 250  copies / mL. A negative result does not preclude SARS-CoV-2 infection  and should not be used as the sole basis for treatment or other  patient management decisions.  A negative result may occur with  improper specimen collection / handling, submission of specimen other  than nasopharyngeal swab,  presence of viral mutation(s) within the  areas targeted by this assay, and inadequate number of viral copies  (<250 copies / mL). A negative result must be combined with clinical  observations, patient history, and epidemiological information. If result is POSITIVE SARS-CoV-2 target nucleic acids are DETECTED. The SARS-CoV-2 RNA is generally detectable in upper and lower  respiratory specimens dur ing the acute phase of infection.  Positive  results are indicative of active infection with SARS-CoV-2.  Clinical  correlation with patient history and other diagnostic information is  necessary to determine patient infection status.  Positive results do  not rule out bacterial infection or  co-infection with other viruses. If result is PRESUMPTIVE POSTIVE SARS-CoV-2 nucleic acids MAY BE PRESENT.   A presumptive positive result was obtained on the submitted specimen  and confirmed on repeat testing.  While 2019 novel coronavirus  (SARS-CoV-2) nucleic acids may be present in the submitted sample  additional confirmatory testing may be necessary for epidemiological  and / or clinical management purposes  to differentiate between  SARS-CoV-2 and other Sarbecovirus currently known to infect humans.  If clinically indicated additional testing with an alternate test  methodology [phone]) is advised. The SARS-CoV-2 RNA is generally  detectable in upper and lower respiratory sp ecimens during the acute  phase of infection. The expected result is Negative. Fact Sheet for Patients:  StrictlyIdeas.no Fact Sheet for Healthcare Providers: BankingDealers.co.za This test is not yet approved or cleared by the [place] FDA and has been authorized for detection and/or diagnosis of SARS-CoV-2 by FDA under an Emergency Use Authorization (EUA).  This EUA will remain in effect (meaning this test can be used) for the duration of the COVID-19 declaration under Section 564(b)(1) of the Act, 21 U.S.C. section 360bbb-3(b)(1), unless the authorization is terminated or revoked sooner. Performed at [facility], [address] [place], [place] [address]     Coagulation Studies: No results for input(s): LABPROT, INR in the last 72 hours.  Urinalysis: Recent Labs    11/16/18 2241  COLORURINE YELLOW*  LABSPEC 1.015  PHURINE 5.0  GLUCOSEU NEGATIVE  HGBUR NEGATIVE  BILIRUBINUR NEGATIVE  KETONESUR NEGATIVE  PROTEINUR NEGATIVE  NITRITE NEGATIVE  LEUKOCYTESUR SMALL*      Imaging: US Renal  Result Date: 11/17/2018 CLINICAL DATA:  Acute renal injury EXAM: RENAL / URINARY TRACT ULTRASOUND COMPLETE COMPARISON:  None. FINDINGS:  Right Kidney: Renal measurements: 12.2 x 5.3 x 5.7 cm. = volume: 192 mL . Echogenicity within normal limits. No mass or hydronephrosis visualized. Left Kidney: Renal measurements: 12.0 x 6.2 x 5.4 cm. = volume: 208 mL. Echogenicity within normal limits. No mass or hydronephrosis visualized. Bladder: Appears normal for degree of bladder distention. IMPRESSION: Normal-appearing kidneys. Electronically Signed   By: Inez Catalina M.D.   On: 11/17/2018 13:54     Medications:    . aspirin EC  81 mg Oral Daily  . atorvastatin  40 mg Oral QHS  . busPIRone  20 mg Oral TID  . divalproex  250 mg Oral Q12H  . docusate sodium  100 mg Oral BID  . fluticasone furoate-vilanterol  1 puff Inhalation Daily  . heparin  5,000 Units Subcutaneous Q8H  . insulin aspart  0-5 Units Subcutaneous QHS  . insulin aspart  0-9 Units Subcutaneous TID WC  . levothyroxine  250 mcg Oral Q0600  . mirabegron ER  50 mg Oral Daily  . montelukast  10 mg Oral Daily  . pantoprazole  40 mg Oral Daily  .  pregabalin  150 mg Oral BID  . torsemide  20 mg Oral BID  . umeclidinium bromide  1 puff Inhalation Daily   acetaminophen **OR** acetaminophen, albuterol, diphenhydrAMINE, methocarbamol, ondansetron **OR** ondansetron (ZOFRAN) IV, propranolol, QUEtiapine, traMADol  Assessment/ Plan:  48 y.o. Caucasian female with diabetes, congestive heart failure, coronary disease, hypertension, hyperlipidemia COPD, bipolar disorder was admitted on 11/16/2018 with lightheadedness and is found to have acute kidney injury.   1.  Acute kidney injury Serum creatinine is back to baseline Most likely caused by overdiuresis in the setting of taking ACE inhibitors.  Patient was also taking nonsteroidals.  Recommend to avoid nonsteroidals Hold ACE inhibitor as blood pressure is low normal Lower the dose of torsemide to 20 mg twice a day as needed May take potassium supplement with torsemide at a lower dose Consider compression socks for possible  lymphedema  2. Hypokalemia - replace as necesary   LOS: [place] 10/6/202010:33 AM  [place], [place]  Note: This note was prepared with Dragon dictation. Any transcription errors are unintentional

## 2018-11-18 NOTE — Care Management Obs Status (Signed)
[place] NOTIFICATION   Patient Details  Name: Marie Clarke MRN: [id number] Date of Birth: [birth year]   Medicare Observation Status Notification Given:  No  Care Manager received call from [facility] with UR that Code 44 needed to be delivered at 11:48 AM. CM went directly to the patient room and she had discharged from the unit. Unable to complete the code 44 process.   Katrina Stack, RN 11/18/2018, 1:49 PM

## 2018-11-18 NOTE — Discharge Instructions (Signed)
It was so nice to meet you during this hospitalization!  You came into the hospital with low blood pressure and kidney injury. We think your kidneys were injured because of the lisinopril, meloxicam (mobic), and ibuprofen  -Please STOP taking lisinopril, ibuprofen, and meloxicam. You should not take any other NSAIDs either (advil, naproxen, etc) -You can continued to take tylenol and tramadol because these are safe for your kidneys -I have decreased your dose of torsemide to 20mg  twice a day and I have decreased your potassium dose to 24mEq (1/2 tablet) twice a day.  To help with the swelling in your legs, you can use compression stockings and you should keep your legs elevated as much as you can during the day.  Take care, Dr. Brett Albino

## 2018-11-18 NOTE — Care Management CC44 (Signed)
Condition Code 44 Documentation Completed  Patient Details  Name: Marie Clarke MRN: [id number] Date of Birth: [birth year]   Condition Code 44 given:  (No) Patient signature on Condition Code 44 notice:    Documentation of 2 MD's agreement:    Code 44 added to claim:       Katrina Stack, RN 11/18/2018, 1:52 PM

## 2018-11-19 DIAGNOSIS — Z6841 Body Mass Index (BMI) 40.0 and over, adult: Secondary | ICD-10-CM | POA: Insufficient documentation

## 2018-11-26 ENCOUNTER — Other Ambulatory Visit: Payer: Self-pay

## 2018-11-26 DIAGNOSIS — Z20822 Contact with and (suspected) exposure to covid-19: Secondary | ICD-10-CM

## 2018-11-27 ENCOUNTER — Encounter: Payer: Self-pay | Admitting: Pain Medicine

## 2018-11-27 LAB — NOVEL CORONAVIRUS, NAA: SARS-CoV-2, NAA: NOT DETECTED

## 2018-11-28 ENCOUNTER — Other Ambulatory Visit: Payer: Self-pay | Admitting: Psychiatry

## 2018-11-28 DIAGNOSIS — F3160 Bipolar disorder, current episode mixed, unspecified: Secondary | ICD-10-CM

## 2018-12-03 ENCOUNTER — Encounter: Payer: Self-pay | Admitting: Psychiatry

## 2018-12-03 ENCOUNTER — Other Ambulatory Visit: Payer: Self-pay

## 2018-12-03 ENCOUNTER — Telehealth: Payer: Self-pay

## 2018-12-03 ENCOUNTER — Ambulatory Visit (INDEPENDENT_AMBULATORY_CARE_PROVIDER_SITE_OTHER): Payer: Medicare HMO | Admitting: Psychiatry

## 2018-12-03 DIAGNOSIS — F3160 Bipolar disorder, current episode mixed, unspecified: Secondary | ICD-10-CM | POA: Diagnosis not present

## 2018-12-03 DIAGNOSIS — F4310 Post-traumatic stress disorder, unspecified: Secondary | ICD-10-CM | POA: Diagnosis not present

## 2018-12-03 DIAGNOSIS — Z79899 Other long term (current) drug therapy: Secondary | ICD-10-CM

## 2018-12-03 DIAGNOSIS — F17200 Nicotine dependence, unspecified, uncomplicated: Secondary | ICD-10-CM

## 2018-12-03 DIAGNOSIS — F410 Panic disorder [episodic paroxysmal anxiety] without agoraphobia: Secondary | ICD-10-CM

## 2018-12-03 MED ORDER — DIVALPROEX SODIUM 250 MG PO DR TAB: 250.0000 mg | DELAYED_RELEASE_TABLET | Freq: Two times a day (BID) | ORAL | 1 refills | Status: DC

## 2018-12-03 MED ORDER — DIVALPROEX SODIUM 500 MG PO DR TAB: 500.0000 mg | DELAYED_RELEASE_TABLET | Freq: Every evening | ORAL | 1 refills | Status: DC

## 2018-12-03 NOTE — Progress Notes (Signed)
Virtual Visit via Telephone Note  I connected with Marie Clarke on 12/03/18 at  2:15 PM EDT by telephone and verified that I am speaking with the correct person using two identifiers.   I discussed the limitations, risks, security and privacy concerns of performing an evaluation and management service by telephone and the availability of in person appointments. I also discussed with the patient that there may be a patient responsible charge related to this service. The patient expressed understanding and agreed to proceed.   I discussed the assessment and treatment plan with the patient. The patient was provided an opportunity to ask questions and all were answered. The patient agreed with the plan and demonstrated an understanding of the instructions.   The patient was advised to call back or seek an in-person evaluation if the symptoms worsen or if the condition fails to improve as anticipated.  [place] MD OP Progress Note  12/03/2018 2:44 PM [name]  MRN:  [id number]  Chief Complaint:  Chief Complaint    Follow-up     HPI: Marie Clarke is a 48 year old Caucasian female, single, on SSD, lives in [place], has a history of bipolar disorder, PTSD, panic attacks, COPD, OSA on CPAP, coronary artery disease, hyperlipidemia, hypertension, hypothyroidism, back pain was evaluated by telemedicine today.  Patient preferred to do a phone call.  Patient today reports she continues to feel irritable on and off.  She reports she feels nervous often.  She reports she is compliant on her medications as prescribed.  She reports sleep is good.  She reports she has not seen her therapist Ms. Cecilie Lowers in a while.  She however agrees to restart psychotherapy sessions.  Patient reports she was released from the hospital after acute kidney injury.  She reports her antihypertensive medications as well as her pain medications may have contributed to it.   Patient denies any suicidality, homicidality or perceptual  disturbances.  Patient denies any other concerns today. Visit Diagnosis:    ICD-10-CM   1. Bipolar I disorder, most recent episode mixed (HCC)  F31.60 divalproex (DEPAKOTE) 500 MG DR tablet    divalproex (DEPAKOTE) 250 MG DR tablet  2. PTSD (post-traumatic stress disorder)  F43.10   3. Panic attacks  F41.0   4. Tobacco use disorder  F17.200   5. High risk medication use  Z79.899 Valproic acid level    CMP and Liver    Past Psychiatric History: I have reviewed past psychiatric history from my progress note on 03/06/2017.    Past Medical History:  Past Medical History:  Diagnosis Date  . Anemia   . Anxiety   . Asthma   . Bipolar disorder ([place])   . CAD (coronary artery disease) unk  . CHF (congestive heart failure) ([place])   . COPD (chronic obstructive pulmonary disease) ([place])   . Depression unk  . Diabetes mellitus without complication ([place])   . Diabetes mellitus, type II ([place])   . Drug overdose   . GERD (gastroesophageal reflux disease)   . Headache   . Hyperlipidemia   . Hypertension   . Left leg pain 04/29/2014  . MI (myocardial infarction) ([place])   . Muscle ache 09/16/2014  . Osteoporosis   . Overactive bladder   . Pancreatitis unk  . Reflex sympathetic dystrophy   . Renal insufficiency   . Sleep apnea    pt reported on 2/6/7 she currently is not using CPAP  . Sleep apnea   . Stroke ([place])   . Thyroid disease   .  TIA (transient ischemic attack) unk  . TIA (transient ischemic attack)     Past Surgical History:  Procedure Laterality Date  . ABDOMINAL HYSTERECTOMY    . HERNIA REPAIR  07/15/2017   [facility]  . prolapse rectum surgery N/A July 2016  . TONSILLECTOMY      Family Psychiatric History: I have reviewed family psychiatric history from my progress note on 03/06/2017  Family History:  Family History  Problem Relation Age of Onset  . Diabetes Mellitus II Mother   . CAD Mother   . Sleep apnea Mother   . Osteoarthritis Mother   . Osteoporosis Mother   .  Anxiety disorder Mother   . Depression Mother   . Bipolar disorder Mother   . Bipolar disorder Father   . Hypertension Father   . Depression Father   . Anxiety disorder Father   . Post-traumatic stress disorder Sister     Social History: I have reviewed social history from my progress note on 03/06/2017 Social History   Socioeconomic History  . Marital status: Single    Spouse name: Not on file  . Number of children: 0  . Years of education: Not on file  . Highest education level: High school graduate  Occupational History    Comment: not employed  Social Needs  . Financial resource strain: Not hard at all  . Food insecurity    Worry: Never true    Inability: Never true  . Transportation needs    Medical: Yes    Non-medical: Yes  Tobacco Use  . Smoking status: Former Smoker    Packs/day: 0.50    Types: Cigarettes  . Smokeless tobacco: Never Used  Substance and Sexual Activity  . Alcohol use: No    Alcohol/week: 0.0 standard drinks  . Drug use: No  . Sexual activity: Not Currently  Lifestyle  . Physical activity    Days per week: 0 days    Minutes per session: 0 min  . Stress: Not at all  Relationships  . Social connections    Talks on phone: More than three times a week    Gets together: More than three times a week    Attends religious service: More than 4 times per year    Active member of club or organization: No    Attends meetings of clubs or organizations: Never    Relationship status: Never married  Other Topics Concern  . Not on file  Social History Narrative  . Not on file    Allergies:  Allergies  Allergen Reactions  . Diazepam Hives and Nausea And Vomiting  . Levofloxacin Hives and Rash    1/31: would like to retry since not taking valium  . Metronidazole Hives    1/31: would like to retry since she is no longer taking valium  . Sulfa Antibiotics Hives  . Cephalexin Hives  . Ciprofloxacin Hives  . Ziprasidone Hcl Other (See Comments)     CONFUSED CONFUSED Other reaction(s): Other (See Comments) CONFUSED CONFUSED   . Penicillins Nausea And Vomiting    Has patient had a PCN reaction causing immediate rash, facial/tongue/throat swelling, SOB or lightheadedness with hypotension: No Has patient had a PCN reaction causing severe rash involving mucus membranes or skin necrosis: No Has patient had a PCN reaction that required hospitalization: No Has patient had a PCN reaction occurring within the last 10 years: No If all of the above answers are "NO", then may proceed with Cephalosporin use.  Metabolic Disorder Labs: Lab Results  Component Value Date   HGBA1C 6.3 (H) 09/29/2018   MPG 134.11 09/29/2018   No results found for: PROLACTIN No results found for: CHOL, TRIG, HDL, CHOLHDL, VLDL, LDLCALC Lab Results  Component Value Date   TSH 2.296 11/17/2018   TSH 3.335 09/29/2018    Therapeutic Level Labs: No results found for: LITHIUM Lab Results  Component Value Date   VALPROATE 33 (L) 09/29/2018   No components found for:  CBMZ  Current Medications: Current Outpatient Medications  Medication Sig Dispense Refill  . diphenoxylate-atropine (LOMOTIL) 2.5-0.025 MG tablet Take by mouth.    Marland Kitchen aspirin EC 81 MG tablet Take 81 mg by mouth daily.     Marland Kitchen atorvastatin (LIPITOR) 40 MG tablet Take 40 mg by mouth at bedtime.     . busPIRone (BUSPAR) 10 MG tablet TAKE 2 TABLETS BY MOUTH 3 TIMES DAILY 540 tablet 1  . diphenhydrAMINE (BENADRYL) 25 mg capsule TAKE 1 CAPSULE BY MOUTH TWICE DAILY AS NEEDED FOR ITCHING 60 capsule 1  . divalproex (DEPAKOTE) 250 MG DR tablet Take 1 tablet (250 mg total) by mouth 2 (two) times daily. 1 tablet AM and 1 tablet after lunch 60 tablet 1  . divalproex (DEPAKOTE) 500 MG DR tablet Take 1 tablet (500 mg total) by mouth every evening. 30 tablet 1  . Dulaglutide 0.75 MG/0.5ML SOPN Inject 0.75 mg into the skin every Saturday.     Marland Kitchen KLOR-CON M20 20 MEQ tablet Take 0.5 tablets (10 mEq total) by mouth  2 (two) times daily. 30 tablet 0  . Lancets (ACCU-CHEK SAFE-T PRO) lancets     . levocetirizine (XYZAL) 5 MG tablet Take 5 mg by mouth daily.     Marland Kitchen levothyroxine (SYNTHROID) 25 MCG tablet     . levothyroxine (SYNTHROID) 50 MCG tablet Take 50 mcg by mouth daily. (292mcg total daily)    . levothyroxine (SYNTHROID, LEVOTHROID) 200 MCG tablet Take 200 mcg by mouth daily. (264mcg total daily)    . Magnesium Oxide 500 MG CAPS Take 1 capsule (500 mg total) by mouth 2 (two) times daily at 8 am and 10 pm. 60 capsule 5  . methocarbamol (ROBAXIN) 750 MG tablet Take 1 tablet (750 mg total) by mouth every 8 (eight) hours as needed for muscle spasms. 90 tablet 5  . montelukast (SINGULAIR) 10 MG tablet Take 10 mg by mouth daily.     Marland Kitchen MYRBETRIQ 50 MG TB24 tablet Take 50 mg by mouth daily.     . naproxen (NAPROSYN) 500 MG tablet     . nitroGLYCERIN (NITROSTAT) 0.4 MG SL tablet Place 0.4 mg under the tongue every 5 (five) minutes as needed for chest pain.     Marland Kitchen omeprazole (PRILOSEC) 20 MG capsule Take 1 capsule (20 mg total) by mouth at bedtime. 30 capsule 5  . ondansetron (ZOFRAN-ODT) 8 MG disintegrating tablet Take 8 mg by mouth every 8 (eight) hours as needed for nausea or vomiting.    . pregabalin (LYRICA) 150 MG capsule Take 1 capsule (150 mg total) by mouth 2 (two) times daily. 290 capsule 0  . propranolol (INDERAL) 20 MG tablet TAKE 1 TABLET BY MOUTH 3 TIMES A DAY AS NEEDED FOR ANXIETY (Patient taking differently: Take 20 mg by mouth 3 (three) times daily as needed (anxiety). ) 90 tablet 1  . QUEtiapine (SEROQUEL) 100 MG tablet Take 1-1.5 tablets (100-150 mg total) by mouth at bedtime as needed (sleep). 135 tablet 1  . rizatriptan (MAXALT)  10 MG tablet Take 1 tablet (10 mg total) by mouth as needed. (Patient taking differently: Take 10 mg by mouth as needed for migraine. ) 10 tablet 2  . torsemide (DEMADEX) 20 MG tablet Take 1 tablet (20 mg total) by mouth 2 (two) times daily. 60 tablet 0  . [START ON  12/17/2018] traMADol (ULTRAM) 50 MG tablet Take 1-2 tablets (50-100 mg total) by mouth every 6 (six) hours. 240 tablet 3  . TRELEGY ELLIPTA 100-62.5-25 MCG/INH AEPB Inhale 1 puff into the lungs daily.     . VENTOLIN HFA 108 (90 Base) MCG/ACT inhaler Inhale 1-2 puffs into the lungs every 6 (six) hours as needed for wheezing or shortness of breath.     . vitamin E 400 UNIT capsule vitamin e 400 iu synthetic     No current facility-administered medications for this visit.      Musculoskeletal: Strength & Muscle Tone: UTA Gait & Station: Reports as WNL Patient leans: N/A  Psychiatric Specialty Exam: Review of Systems  Psychiatric/Behavioral: The patient is nervous/anxious.        Irritable  All other systems reviewed and are negative.   There were no vitals taken for this visit.There is no height or weight on file to calculate BMI.  General Appearance: UTA  Eye Contact:  UTA  Speech:  Clear and Coherent  Volume:  Normal  Mood:  Irritable  Affect:  UTA  Thought Process:  Goal Directed and Descriptions of Associations: Intact  Orientation:  Full (Time, Place, and Person)  Thought Content: Logical   Suicidal Thoughts:  No  Homicidal Thoughts:  No  Memory:  Immediate;   Fair Recent;   Fair Remote;   Fair  Judgement:  Fair  Insight:  Fair  Psychomotor Activity:  UTA  Concentration:  Concentration: Fair and Attention Span: Fair  Recall:  [employer] of Knowledge: Fair  Language: Fair  Akathisia:  No  Handed:  Right  AIMS (if indicated): Denies tremors, rigidity  Assets:  Communication Skills Desire for Improvement Housing Social Support  ADL's:  Intact  Cognition: WNL  Sleep:  Fair   Screenings: PHQ2-9     Procedure visit from 04/22/2018 in [place] Procedure visit from 03/13/2018 in [place] Office Visit from 12/23/2017 in [place]  Procedure visit from 12/05/2017 in [place] Office Visit from 11/13/2017 in [place]  PHQ-2 Total Score  0  0  0  0  0       Assessment and Plan: Aleidy is a 48 year old Caucasian female who has a history of bipolar disorder, panic attacks, tobacco use disorder, multiple medical problems was evaluated by telemedicine today.  Patient is currently struggling with mood lability although making progress.  She will continue to benefit from medication readjustment as well as psychotherapy sessions.  Plan Bipolar disorder-some progress Increase Depakote to 250 mg p.o. twice daily and 500 mg every afternoon. We will get Depakote level and CMP.  Will mail lab slip to her today. Reviewed and discussed Depakote level dated 09/29/2018-33-subtherapeutic BuSpar 20 mg p.o. 3 times daily  PTSD-improving She will continue psychotherapy sessions with Ms. Cecilie Lowers.  Discussed with patient to reach out and make an appointment.  Panic attacks-improving Propranolol 20 mg p.o. 3 times daily  Tobacco use disorder in early remission We will continue to monitor.  Follow-up in clinic  in 4 weeks or sooner if needed.  November 20 at 10:20 AM  I have spent atleast 15 minutes non face to face with patient today. More than 50 % of the time was spent for psychoeducation and supportive psychotherapy and care coordination.  This note was generated in part or whole with voice recognition software. Voice recognition is usually quite accurate but there are transcription errors that can and very often do occur. I apologize for any typographical errors that were not detected and corrected.      Ursula Alert, MD 12/03/2018, 2:44 PM

## 2018-12-03 NOTE — Telephone Encounter (Signed)
labwork order mailed out 

## 2018-12-05 ENCOUNTER — Telehealth: Payer: Self-pay | Admitting: *Deleted

## 2018-12-05 ENCOUNTER — Telehealth: Payer: Self-pay

## 2018-12-05 ENCOUNTER — Encounter: Payer: Self-pay | Admitting: Pain Medicine

## 2018-12-07 NOTE — Progress Notes (Signed)
Pain Management Virtual Encounter Note - Virtual Visit via Telephone Telehealth (real-time audio visits between healthcare provider and patient).   Patient's Phone No. & Preferred Pharmacy:  [phone] (home); [phone] (mobile); (Preferred) [phone] [email or web address]  [facility] [address] [place] [address] Phone: [phone] Fax: [phone]    Pre-screening note:  Our staff contacted Marie Clarke and offered her an "in person", "face-to-face" appointment versus a telephone encounter. She indicated preferring the telephone encounter, at this time.   Reason for Virtual Visit: COVID-19*  Social distancing based on CDC and AMA recommendations.   I contacted Marie Clarke on 12/08/2018 via telephone.      I clearly identified myself as Gaspar Cola, MD. I verified that I was speaking with the correct person using two identifiers (Name: Marie Clarke, and date of birth: 1970/04/12).  Advanced Informed Consent I sought verbal advanced consent from [facility] for virtual visit interactions. I informed Marie Clarke of possible security and privacy concerns, risks, and limitations associated with providing "not-in-person" medical evaluation and management services. I also informed Marie Clarke of the availability of "in-person" appointments. Finally, I informed her that there would be a charge for the virtual visit and that she could be  personally, fully or partially, financially responsible for it. Marie Clarke expressed understanding and agreed to proceed.   Historic Elements   Marie Clarke is a 48 y.o. year old, female patient evaluated today after her last encounter by our practice on 12/05/2018. Marie Clarke  has a past medical history of Anemia, Anxiety, Asthma, Bipolar disorder ([place]), CAD (coronary artery disease) ([place]), CHF (congestive heart failure) ([place]), COPD (chronic obstructive pulmonary disease) ([place]), Depression ([place]), Diabetes  mellitus without complication ([place]), Diabetes mellitus, type II ([place]), Drug overdose, GERD (gastroesophageal reflux disease), Headache, Hyperlipidemia, Hypertension, Left leg pain (04/29/2014), MI (myocardial infarction) ([place]), Muscle ache (09/16/2014), Osteoporosis, Overactive bladder, Pancreatitis ([place]), Reflex sympathetic dystrophy, Renal insufficiency, Sleep apnea, Sleep apnea, Stroke ([place]), Thyroid disease, TIA (transient ischemic attack) ([place]), and TIA (transient ischemic attack). She also  has a past surgical history that includes prolapse rectum surgery (N/A, July 2016); Tonsillectomy; Abdominal hysterectomy; and Hernia repair (07/15/2017). Marie Clarke has a current medication list which includes the following prescription(s): atorvastatin, buspirone, diphenhydramine, diphenoxylate-atropine, divalproex, divalproex, dulaglutide, furosemide, klor-con m20, accu-chek safe-t pro, levocetirizine, levothyroxine, levothyroxine, levothyroxine, magnesium oxide, methocarbamol, montelukast, naproxen, nitroglycerin, omeprazole, ondansetron, pregabalin, propranolol, quetiapine, rizatriptan, torsemide, tramadol, trelegy ellipta, ventolin hfa, vitamin e, and myrbetriq. She  reports that she quit smoking about 3 months ago. Her smoking use included cigarettes. She smoked 0.50 packs per day. She has never used smokeless tobacco. She reports that she does not drink alcohol or use drugs. Marie Clarke is allergic to diazepam; levofloxacin; metronidazole; sulfa antibiotics; cephalexin; ciprofloxacin; ziprasidone hcl; and penicillins.   HPI  Today, she is being contacted for follow-up evaluation to discuss possible SCS implant trial.  Today the patient wanted to know if she had to go back to the medical psychologist again.  I informed her that she did not.  I do have her evaluation done on 11/03/2018 which indicates that she is a suitable candidate for the spinal cord stimulator trial.  Today I will be scanning the results into her  medical record.  She still has a hernia repair pending, but after that, she would like to do the trial.  Today I will go ahead and get started with the process of getting this preapproved.  Pharmacotherapy Assessment  Analgesic: Tramadol 50 mg, 1-2 tab q 6 hrs (200-400 mg/day of tramadol) High-Risk due to history of a prior suicidal attempt w/ medications. MME/day:20-40mg /day.   Monitoring: Pharmacotherapy: No side-effects or adverse reactions reported. [place] PMP: PDMP reviewed during this encounter.       Compliance: No problems identified. Effectiveness: Clinically acceptable. Plan: Refer to "POC".  UDS:  Summary  Date Value Ref Range Status  01/22/2018 FINAL  Final    Comment:    ==================================================================== TOXASSURE SELECT 13 (MW) ==================================================================== Test                             Result       Flag       Units Drug Present and Declared for Prescription Verification   Tramadol                       2742         EXPECTED   ng/mg creat   O-Desmethyltramadol            5630         EXPECTED   ng/mg creat   N-Desmethyltramadol            2591         EXPECTED   ng/mg creat    Source of tramadol is a prescription medication.    O-desmethyltramadol and N-desmethyltramadol are expected    metabolites of tramadol. ==================================================================== Test                      Result    Flag   Units      Ref Range   Creatinine              66               mg/dL      >=20 ==================================================================== Declared Medications:  The flagging and interpretation on this report are based on the  following declared medications.  Unexpected results may arise from  inaccuracies in the declared medications.  **Note: The testing scope of this panel includes these medications:  Tramadol  **Note: The testing scope of this panel does not  include following  reported medications:  Acetaminophen  Aspirin (Aspirin 81)  Atorvastatin  Atropine (Lomotil)  Azelastine  Bumetanide  Buspirone  Diphenoxylate (Lomotil)  Fluticasone (Trelegy Ellipta)  Gabapentin  Hydroxyzine (Vistaril)  Lamotrigine  Levocetirizine (Xyzal)  Levothyroxine  Magnesium Oxide  Metformin  Methocarbamol  Mirabegron (Myrbetriq)  Montelukast  Nicotine  Omega-3 Fatty Acids  Ondansetron  Potassium  Potassium (Klor-Con)  Promethazine  Quetiapine  Rizatriptan  Umeclidinium (Trelegy Ellipta)  Vilanterol (Trelegy Ellipta) ==================================================================== For clinical consultation, please call [phone]. ====================================================================    Laboratory Chemistry Profile (12 mo)  Renal: 11/18/2018: BUN 18; Creatinine, Ser 0.78  Lab Results  Component Value Date   GFRAA >60 11/18/2018   GFRNONAA >60 11/18/2018   Hepatic: 11/17/2018: Albumin 3.4 Lab Results  Component Value Date   AST 20 11/17/2018   ALT 15 11/17/2018   Other: 11/17/2018: Vitamin B-12 366 Note: Above Lab results reviewed.  Imaging  Last 90 days:  Ct Head Wo Contrast  Result Date: 09/29/2018 CLINICAL DATA:  Altered level of consciousness EXAM: CT HEAD WITHOUT CONTRAST TECHNIQUE: Contiguous axial images were obtained from the base of the skull through the vertex without intravenous contrast. COMPARISON:  Head CT and brain MRI September 20, 2016 FINDINGS:  Brain: The ventricles are normal in size and configuration. There is no intracranial mass, hemorrhage, extra-axial fluid collection, or midline shift. The brain parenchyma appears unremarkable. No evident acute infarct. Vascular: There is no hyperdense vessel. There is no appreciable vascular calcification. Skull: Bony calvarium appears intact. Sinuses/Orbits: There is slight mucosal thickening in the inferior right maxillary antrum. There is slight mucosal  thickening in several ethmoid air cells. Other visualized paranasal sinuses are clear. Orbits appear symmetric bilaterally. Other: Mastoid air cells are clear. IMPRESSION: Slight paranasal sinus disease.  Study otherwise unremarkable. Electronically Signed   By: Lowella Grip III M.D.   On: 09/29/2018 09:42   Ct Abdomen Pelvis W Contrast  Result Date: 09/29/2018 CLINICAL DATA:  Acute onset abdominal pain, nausea and vomiting, and diarrhea last night. EXAM: CT ABDOMEN AND PELVIS WITH CONTRAST TECHNIQUE: Multidetector CT imaging of the abdomen and pelvis was performed using the standard protocol following bolus administration of intravenous contrast. CONTRAST:  155mL OMNIPAQUE IOHEXOL 300 MG/ML  SOLN COMPARISON:  09/17/2018 and 09/06/2017 FINDINGS: Lower Chest: No acute findings. Hepatobiliary: No hepatic masses identified. Gallbladder is unremarkable. No evidence of biliary ductal dilatation. Pancreas:  No mass or inflammatory changes. Spleen: Within normal limits in size and appearance. Adrenals/Urinary Tract: Stable 2 cm left adrenal mass, consistent with benign adenoma. No evidence of renal masses or hydronephrosis. Unremarkable unopacified urinary bladder. Stomach/Bowel: No evidence of obstruction, inflammatory process or abnormal fluid collections. Anastomotic staples noted in the sigmoid colon. Normal appendix visualized. Vascular/Lymphatic: No pathologically enlarged lymph nodes. No abdominal aortic aneurysm. Reproductive: Prior hysterectomy noted. Adnexal regions are unremarkable in appearance. Other: Stable tiny ventral abdominal wall incisional hernia containing a collapsed loop of small bowel. Musculoskeletal:  No suspicious bone lesions identified. IMPRESSION: 1. No acute findings within the abdomen or pelvis. 2. Stable tiny ventral abdominal wall incisional hernia. 3. Stable small benign left adrenal adenoma. Electronically Signed   By: Marlaine Hind M.D.   On: 09/29/2018 09:59   Ct Abdomen  Pelvis W Contrast  Result Date: 09/17/2018 CLINICAL DATA:  Acute generalized abdominal pain. EXAM: CT ABDOMEN AND PELVIS WITH CONTRAST TECHNIQUE: Multidetector CT imaging of the abdomen and pelvis was performed using the standard protocol following bolus administration of intravenous contrast. CONTRAST:  14mL OMNIPAQUE IOHEXOL 300 MG/ML  SOLN COMPARISON:  September 06, 2017 FINDINGS: Lower chest: No acute abnormality. Hepatobiliary: No focal liver abnormality is seen. No gallstones, gallbladder wall thickening, or biliary dilatation. Pancreas: Unremarkable. No pancreatic ductal dilatation or surrounding inflammatory changes. Spleen: Normal in size without focal abnormality. Adrenals/Urinary Tract: 2 cm intermediate density left adrenal mass, measuring 44 Hounsfield units. Stable in size from July 2019 and March 2018. Normal appearance of the bilateral kidneys and urinary bladder. Stomach/Bowel: Stomach is within normal limits. Appendix appears normal. No evidence of bowel wall thickening, distention, or inflammatory changes. Postsurgical changes of the sigmoid colon without complicating features. Vascular/Lymphatic: No significant vascular findings are present. No enlarged abdominal or pelvic lymph nodes. Reproductive: Status post hysterectomy. No adnexal masses. Other: For current tiny periumbilical anterior abdominal wall hernia which contains a short segment of small bowel. The segment of bowel within the hernial sac has normal appearance, however given the narrow neck of the hernia, there is potential for incarceration. Musculoskeletal: No acute or significant osseous findings. IMPRESSION: 1. Tiny periumbilical anterior abdominal wall hernia which contains a short segment of small bowel. The segment of bowel within the hernial sac has normal appearance, however given the narrow neck of the hernia, there  is potential for incarceration. 2. Otherwise no evidence of acute abnormalities within the abdomen or pelvis. 3.  Stable 2 cm left adrenal mass. Electronically Signed   By: Fidela Salisbury M.D.   On: 09/17/2018 13:14   US Renal  Result Date: 11/17/2018 CLINICAL DATA:  Acute renal injury EXAM: RENAL / URINARY TRACT ULTRASOUND COMPLETE COMPARISON:  None. FINDINGS: Right Kidney: Renal measurements: 12.2 x 5.3 x 5.7 cm. = volume: 192 mL . Echogenicity within normal limits. No mass or hydronephrosis visualized. Left Kidney: Renal measurements: 12.0 x 6.2 x 5.4 cm. = volume: 208 mL. Echogenicity within normal limits. No mass or hydronephrosis visualized. Bladder: Appears normal for degree of bladder distention. IMPRESSION: Normal-appearing kidneys. Electronically Signed   By: Inez Catalina M.D.   On: 11/17/2018 13:54   US Carotid Bilateral  Result Date: 09/30/2018 CLINICAL DATA:  48 year old female with syncope EXAM: BILATERAL CAROTID DUPLEX ULTRASOUND TECHNIQUE: Pearline Cables scale imaging, color Doppler and duplex ultrasound were performed of bilateral carotid and vertebral arteries in the neck. COMPARISON:  None. FINDINGS: Criteria: Quantification of carotid stenosis is based on velocity parameters that correlate the residual internal carotid diameter with NASCET-based stenosis levels, using the diameter of the distal internal carotid lumen as the denominator for stenosis measurement. The following velocity measurements were obtained: RIGHT ICA: 113/26 cm/sec CCA: [id number] cm/sec SYSTOLIC ICA/CCA RATIO:  1.3 ECA:  136 cm/sec LEFT ICA: 80/29 cm/sec CCA: [id number] cm/sec SYSTOLIC ICA/CCA RATIO:  0.9 ECA:  97 cm/sec RIGHT CAROTID ARTERY: No significant atherosclerotic plaque or evidence of stenosis. RIGHT VERTEBRAL ARTERY:  Patent with normal antegrade flow. LEFT CAROTID ARTERY: No significant atherosclerotic plaque or stenosis. LEFT VERTEBRAL ARTERY:  Patent with normal antegrade flow. IMPRESSION: 1. No significant atherosclerotic plaque or internal carotid artery stenosis. 2. Vertebral arteries are patent with normal antegrade flow.  Signed, Criselda Peaches, MD, [facility] Electronically Signed   By: Jacqulynn Cadet M.D.   On: 09/30/2018 12:36    Assessment  The primary encounter diagnosis was Chronic pain syndrome. Diagnoses of Chronic low back pain (Primary Area of Pain) (Bilateral) (L>R), Chronic lower extremity pain (Referred) (Secondary Area of Pain) (Left), Chronic ankle pain (Tertiary Area of Pain) (Left), Chronic neck pain (Fourth Area of Pain) (Bilateral) (L>R), Chronic shoulder pain (Fifth Area of Pain) (Bilateral) (L>R), and CRPS (complex regional pain syndrome) type 1 of lower limb (left ankle) were also pertinent to this visit.  Plan of Care  I have discontinued Marie Clarke aspirin EC. I am also having her maintain her atorvastatin, levothyroxine, montelukast, Trelegy Ellipta, Myrbetriq, Ventolin HFA, rizatriptan, levocetirizine, nitroGLYCERIN, Dulaglutide, levothyroxine, ondansetron, methocarbamol, Magnesium Oxide, omeprazole, traMADol, pregabalin, propranolol, diphenhydrAMINE, Klor-Con M20, torsemide, busPIRone, QUEtiapine, vitamin E, naproxen, levothyroxine, accu-chek safe-t pro, diphenoxylate-atropine, divalproex, divalproex, and furosemide.  Pharmacotherapy (Medications Ordered): No orders of the defined types were placed in this encounter.  Orders:  Orders Placed This Encounter  Procedures  . [facility] representative to notify them of the scheduled case and to make sure they will be available to provide required equipment.    Standing Status:   Future    Standing Expiration Date:   06/07/2019    Scheduling Instructions:     Side: Bilateral     Level: Lumbar     Device: Medtronic     Sedation: With sedation     Timeframe: As soon as pre-approved    Order Specific Question:   Where will this  procedure be performed?    Answer:   [facility]   Follow-up plan:   Return in about 18 weeks  (around 04/13/2019) for (VV), (MM), in addition, Procedure (w/ sedation): (B) Lumbar SCS Trial.      Interventional management options:  Considering:   NOTE: The patient is medical psychology evaluation indicates that the patient is a "suitable candidate" for a spinal cord stimulator trial. Possible lumbar spinal cord stimulator trial Diagnosticbilateral lumbar facet nerve block Possible bilateral lumbar facetRFA Diagnostic left-sided L5-S1 transforaminal ESI Diagnostic left-sided L5 selective nerve root block Possible left-sided L5 DRG RFA Diagnostic left-sidedlumbarsympathetic nerve block Possible left-sided lumbar sympathetic RFA Diagnostic bilateral cervical facet nerve block Possible bilateral cervical facetRFA Diagnosticleft intra-articular shoulder injection Diagnosticleft suprascapular nerve block Diagnostic left suprascapularRFA   Palliative PRN treatment(s):   Diagnostic left L5 TFESI #2 + right L4-5 LESI #2  Palliative bilateral lumbar facet blocks Palliative right-sided lumbar facet RFA #2 (last done 04/22/2018) Palliative left-sided lumbar facet RFA #2 (last done 03/13/2018)     Recent Visits Date Type Provider Dept  11/17/18 Office Visit Milinda Pointer, [facility]  10/15/18 Office Visit Milinda Pointer, [facility]  09/18/18 Office Visit Milinda Pointer, MD [facility]  Showing recent visits within past 90 days and meeting all other requirements   Today's Visits Date Type Provider Dept  12/08/18 Office Visit Milinda Pointer, MD [facility]  Showing today's visits and meeting all other requirements   Future Appointments No visits were found meeting these conditions.  Showing future appointments within next 90 days and meeting all other requirements   I discussed the assessment and treatment plan with the patient. The patient was provided an opportunity to ask questions and all were  answered. The patient agreed with the plan and demonstrated an understanding of the instructions.  Patient advised to call back or seek an in-person evaluation if the symptoms or condition worsens.  Total duration of non-face-to-face encounter: 15 minutes.  Note by: Gaspar Cola, MD Date: 12/08/2018; Time: 1:34 PM  Note: This dictation was prepared with Dragon dictation. Any transcriptional errors that may result from this process are unintentional.  Disclaimer:  * Given the special circumstances of the COVID-19 pandemic, the federal government has announced that the Office for Civil Rights (OCR) will exercise its enforcement discretion and will not impose penalties on physicians using telehealth in the event of noncompliance with regulatory requirements under the [place] and [place] (HIPAA) in connection with the good faith provision of telehealth during the XX123456 national public health emergency. ([place])

## 2018-12-08 ENCOUNTER — Ambulatory Visit: Payer: Medicare HMO | Attending: Pain Medicine | Admitting: Pain Medicine

## 2018-12-08 ENCOUNTER — Other Ambulatory Visit: Payer: Self-pay

## 2018-12-08 DIAGNOSIS — M79605 Pain in left leg: Secondary | ICD-10-CM

## 2018-12-08 DIAGNOSIS — M25512 Pain in left shoulder: Secondary | ICD-10-CM

## 2018-12-08 DIAGNOSIS — G90522 Complex regional pain syndrome I of left lower limb: Secondary | ICD-10-CM

## 2018-12-08 DIAGNOSIS — M25572 Pain in left ankle and joints of left foot: Secondary | ICD-10-CM

## 2018-12-08 DIAGNOSIS — G894 Chronic pain syndrome: Secondary | ICD-10-CM | POA: Diagnosis not present

## 2018-12-08 DIAGNOSIS — M545 Low back pain, unspecified: Secondary | ICD-10-CM

## 2018-12-08 DIAGNOSIS — M25511 Pain in right shoulder: Secondary | ICD-10-CM

## 2018-12-08 DIAGNOSIS — M542 Cervicalgia: Secondary | ICD-10-CM

## 2018-12-08 DIAGNOSIS — G8929 Other chronic pain: Secondary | ICD-10-CM

## 2018-12-08 NOTE — Patient Instructions (Signed)

## 2018-12-10 ENCOUNTER — Other Ambulatory Visit: Payer: Self-pay | Admitting: Pain Medicine

## 2018-12-10 NOTE — Progress Notes (Signed)
Virtual Visit via Video Note  I connected with Marie Clarke on 12/11/18 at 10:00 AM EDT by a video enabled telemedicine application and verified that I am speaking with the correct person using two identifiers.   I discussed the limitations of evaluation and management by telemedicine and the availability of in person appointments. The patient expressed understanding and agreed to proceed.     I discussed the assessment and treatment plan with the patient. The patient was provided an opportunity to ask questions and all were answered. The patient agreed with the plan and demonstrated an understanding of the instructions.   The patient was advised to call back or seek an in-person evaluation if the symptoms worsen or if the condition fails to improve as anticipated.  [facility] MD OP Progress Note  12/11/2018 12:05 PM Marie Clarke  MRN:  [id number]  Chief Complaint:  Chief Complaint    Follow-up     HPI: Marie Clarke is a 48 year old Caucasian female, single, on SSD, lives in [place], has a history of bipolar disorder, PTSD, panic attacks, COPD, OSA on CPAP, coronary artery disease, hyperlipidemia, hypertension, hypothyroidism, back pain was evaluated by telemedicine today.  Patient today reports she is currently compliant on her Depakote as prescribed.  Her dosage was increased few days ago.  She reports that has helped her with her mood symptoms and she feels more stable.  She denies any significant irritability or anxiety symptoms.  She however reports she has not been able to get a Depakote level done yet.  She however agrees to get it done soon.  She reports sleep is good.  She denies any suicidality, homicidality or perceptual disturbances.  She has been keeping herself occupied doing jigsaw puzzles.  That helps her to calm down.  She reports she has upcoming surgery for hernia coming up.  She has was not able to make an appointment with her therapist.  She however agrees to do it soon.  She  denies any other concerns today.     Visit Diagnosis:    ICD-10-CM   1. Bipolar I disorder, most recent episode mixed ([place])  F31.60 busPIRone (BUSPAR) 30 MG tablet  2. PTSD (post-traumatic stress disorder)  F43.10 busPIRone (BUSPAR) 30 MG tablet  3. Panic attacks  F41.0 busPIRone (BUSPAR) 30 MG tablet  4. Tobacco use disorder  F17.200     Past Psychiatric History: I have reviewed past psychiatric history from my progress note on 03/06/2017.  Past Medical History:  Past Medical History:  Diagnosis Date  . Anemia   . Anxiety   . Asthma   . Bipolar disorder ([place])   . CAD (coronary artery disease) unk  . CHF (congestive heart failure) ([place])   . COPD (chronic obstructive pulmonary disease) ([place])   . Depression unk  . Diabetes mellitus without complication ([place])   . Diabetes mellitus, type II ([place])   . Drug overdose   . GERD (gastroesophageal reflux disease)   . Headache   . Hyperlipidemia   . Hypertension   . Left leg pain 04/29/2014  . MI (myocardial infarction) ([place])   . Muscle ache 09/16/2014  . Osteoporosis   . Overactive bladder   . Pancreatitis unk  . Reflex sympathetic dystrophy   . Renal insufficiency   . Sleep apnea    pt reported on 2/6/7 she currently is not using CPAP  . Sleep apnea   . Stroke ([place])   . Thyroid disease   . TIA (transient ischemic attack) unk  .  TIA (transient ischemic attack)     Past Surgical History:  Procedure Laterality Date  . ABDOMINAL HYSTERECTOMY    . HERNIA REPAIR  07/15/2017   [facility]  . prolapse rectum surgery N/A July 2016  . TONSILLECTOMY      Family Psychiatric History: Reviewed family psychiatric history from my progress note on 03/06/2017.  Family History:  Family History  Problem Relation Age of Onset  . Diabetes Mellitus II Mother   . CAD Mother   . Sleep apnea Mother   . Osteoarthritis Mother   . Osteoporosis Mother   . Anxiety disorder Mother   . Depression Mother   . Bipolar disorder Mother   . Bipolar disorder  Father   . Hypertension Father   . Depression Father   . Anxiety disorder Father   . Post-traumatic stress disorder Sister     Social History: Reviewed social history from my progress note on 03/06/2017. Social History   Socioeconomic History  . Marital status: Single    Spouse name: Not on file  . Number of children: 0  . Years of education: Not on file  . Highest education level: High school graduate  Occupational History    Comment: not employed  Social Needs  . Financial resource strain: Not hard at all  . Food insecurity    Worry: Never true    Inability: Never true  . Transportation needs    Medical: Yes    Non-medical: Yes  Tobacco Use  . Smoking status: Former Smoker    Packs/day: 0.50    Types: Cigarettes    Quit date: 09/01/2018    Years since quitting: 0.2  . Smokeless tobacco: Never Used  . Tobacco comment: Patient quit smoking 09/01/2018  Substance and Sexual Activity  . Alcohol use: No    Alcohol/week: 0.0 standard drinks  . Drug use: No  . Sexual activity: Not Currently  Lifestyle  . Physical activity    Days per week: 0 days    Minutes per session: 0 min  . Stress: Not at all  Relationships  . Social connections    Talks on phone: More than three times a week    Gets together: More than three times a week    Attends religious service: More than 4 times per year    Active member of club or organization: No    Attends meetings of clubs or organizations: Never    Relationship status: Never married  Other Topics Concern  . Not on file  Social History Narrative  . Not on file    Allergies:  Allergies  Allergen Reactions  . Diazepam Hives and Nausea And Vomiting  . Levofloxacin Hives and Rash    1/31: would like to retry since not taking valium  . Metronidazole Hives    1/31: would like to retry since she is no longer taking valium  . Sulfa Antibiotics Hives  . Cephalexin Hives  . Ciprofloxacin Hives  . Ziprasidone Hcl Other (See Comments)     CONFUSED CONFUSED Other reaction(s): Other (See Comments) CONFUSED CONFUSED   . Penicillins Nausea And Vomiting    Has patient had a PCN reaction causing immediate rash, facial/tongue/throat swelling, SOB or lightheadedness with hypotension: No Has patient had a PCN reaction causing severe rash involving mucus membranes or skin necrosis: No Has patient had a PCN reaction that required hospitalization: No Has patient had a PCN reaction occurring within the last 10 years: No If all of the above answers are "  NO", then may proceed with Cephalosporin use.     Metabolic Disorder Labs: Lab Results  Component Value Date   HGBA1C 6.3 (H) 09/29/2018   MPG 134.11 09/29/2018   No results found for: PROLACTIN No results found for: CHOL, TRIG, HDL, CHOLHDL, VLDL, LDLCALC Lab Results  Component Value Date   TSH 2.296 11/17/2018   TSH 3.335 09/29/2018    Therapeutic Level Labs: No results found for: LITHIUM Lab Results  Component Value Date   VALPROATE 33 (L) 09/29/2018   No components found for:  CBMZ  Current Medications: Current Outpatient Medications  Medication Sig Dispense Refill  . divalproex (DEPAKOTE) 500 MG DR tablet     . meloxicam (MOBIC) 15 MG tablet     . atorvastatin (LIPITOR) 40 MG tablet Take 40 mg by mouth at bedtime.     . busPIRone (BUSPAR) 30 MG tablet Take 1 tablet (30 mg total) by mouth 2 (two) times daily. 180 tablet 1  . diphenhydrAMINE (BENADRYL) 25 mg capsule TAKE 1 CAPSULE BY MOUTH TWICE DAILY AS NEEDED FOR ITCHING 60 capsule 1  . diphenoxylate-atropine (LOMOTIL) 2.5-0.025 MG tablet Take by mouth.    . divalproex (DEPAKOTE) 250 MG DR tablet Take 1 tablet (250 mg total) by mouth 2 (two) times daily. 1 tablet AM and 1 tablet after lunch 60 tablet 1  . divalproex (DEPAKOTE) 500 MG DR tablet Take 1 tablet (500 mg total) by mouth every evening. 30 tablet 1  . Dulaglutide 0.75 MG/0.5ML SOPN Inject 0.75 mg into the skin every Saturday.     . furosemide  (LASIX) 20 MG tablet Take 20 mg by mouth 2 (two) times daily.    Marland Kitchen KLOR-CON M20 20 MEQ tablet Take 0.5 tablets (10 mEq total) by mouth 2 (two) times daily. (Patient taking differently: Take 20 mEq by mouth daily. ) 30 tablet 0  . Lancets (ACCU-CHEK SAFE-T PRO) lancets     . levocetirizine (XYZAL) 5 MG tablet Take 5 mg by mouth daily.     Marland Kitchen levothyroxine (SYNTHROID) 25 MCG tablet     . levothyroxine (SYNTHROID) 50 MCG tablet Take 50 mcg by mouth daily. (227mcg total daily)    . levothyroxine (SYNTHROID, LEVOTHROID) 200 MCG tablet Take 200 mcg by mouth daily. (224mcg total daily)    . Magnesium Oxide 500 MG CAPS Take 1 capsule (500 mg total) by mouth 2 (two) times daily at 8 am and 10 pm. 60 capsule 5  . methocarbamol (ROBAXIN) 750 MG tablet Take 1 tablet (750 mg total) by mouth every 8 (eight) hours as needed for muscle spasms. 90 tablet 5  . montelukast (SINGULAIR) 10 MG tablet Take 10 mg by mouth daily.     Marland Kitchen MYRBETRIQ 50 MG TB24 tablet Take 50 mg by mouth daily.     . naproxen (NAPROSYN) 500 MG tablet     . nitroGLYCERIN (NITROSTAT) 0.4 MG SL tablet Place 0.4 mg under the tongue every 5 (five) minutes as needed for chest pain.     Marland Kitchen omeprazole (PRILOSEC) 20 MG capsule Take 1 capsule (20 mg total) by mouth at bedtime. 30 capsule 5  . ondansetron (ZOFRAN-ODT) 8 MG disintegrating tablet Take 8 mg by mouth every 8 (eight) hours as needed for nausea or vomiting.    . pregabalin (LYRICA) 150 MG capsule Take 1 capsule (150 mg total) by mouth 2 (two) times daily. 290 capsule 0  . propranolol (INDERAL) 20 MG tablet TAKE 1 TABLET BY MOUTH 3 TIMES A DAY AS NEEDED  FOR ANXIETY (Patient taking differently: Take 20 mg by mouth 3 (three) times daily as needed (anxiety). ) 90 tablet 1  . QUEtiapine (SEROQUEL) 100 MG tablet Take 1-1.5 tablets (100-150 mg total) by mouth at bedtime as needed (sleep). 135 tablet 1  . rizatriptan (MAXALT) 10 MG tablet Take 1 tablet (10 mg total) by mouth as needed. (Patient taking  differently: Take 10 mg by mouth as needed for migraine. ) 10 tablet 2  . torsemide (DEMADEX) 20 MG tablet Take 1 tablet (20 mg total) by mouth 2 (two) times daily. 60 tablet 0  . [START ON 12/17/2018] traMADol (ULTRAM) 50 MG tablet Take 1-2 tablets (50-100 mg total) by mouth every 6 (six) hours. 240 tablet 3  . TRELEGY ELLIPTA 100-62.5-25 MCG/INH AEPB Inhale 1 puff into the lungs daily.     . VENTOLIN HFA 108 (90 Base) MCG/ACT inhaler Inhale 1-2 puffs into the lungs every 6 (six) hours as needed for wheezing or shortness of breath.     . vitamin E 400 UNIT capsule vitamin e 400 iu synthetic     No current facility-administered medications for this visit.      Musculoskeletal: Strength & Muscle Tone: UTA Gait & Station: normal Patient leans: N/A  Psychiatric Specialty Exam: Review of Systems  Psychiatric/Behavioral: Negative for depression, hallucinations, substance abuse and suicidal ideas. The patient is not nervous/anxious and does not have insomnia.   All other systems reviewed and are negative.   There were no vitals taken for this visit.There is no height or weight on file to calculate BMI.  General Appearance: Casual  Eye Contact:  Fair  Speech:  Normal Rate  Volume:  Normal  Mood:  Euthymic  Affect:  Congruent  Thought Process:  Goal Directed and Descriptions of Associations: Intact  Orientation:  Full (Time, Place, and Person)  Thought Content: Logical   Suicidal Thoughts:  No  Homicidal Thoughts:  No  Memory:  Immediate;   Fair Recent;   Fair Remote;   Fair  Judgement:  Fair  Insight:  Fair  Psychomotor Activity:  Normal  Concentration:  Concentration: Fair and Attention Span: Fair  Recall:  [employer] of Knowledge: Fair  Language: Fair  Akathisia:  No  Handed:  Right  AIMS (if indicated): Denies tremors, rigidity  Assets:  Communication Skills Desire for Improvement Social Support  ADL's:  Intact  Cognition: WNL  Sleep:  Fair   Screenings: PHQ2-9      Procedure visit from 04/22/2018 in [facility] Procedure visit from 03/13/2018 in [facility] Office Visit from 12/23/2017 in [facility] Procedure visit from 12/05/2017 in [facility] Office Visit from 11/13/2017 in [facility]  PHQ-2 Total Score  0  0  0  0  0       Assessment and Plan:Crissi is a 48 year old Caucasian female who has a history of bipolar disorder, panic attacks, tobacco use disorder, multiple medical problems was evaluated by telemedicine today.  Patient is currently making progress on the recent increase of medications.  Patient will continue to need medication management and psychotherapy sessions.  Patient however has been noncompliant with requested labs.  Encouraged her to do so.  Plan as noted below.  Plan Bipolar disorder-improving Depakote 250 mg p.o. twice daily and 500 mg q. noon. Pending Depakote level and CMP which were ordered last visit-patient  has been noncompliant. Depakote level on 09/29/2018-33-subtherapeutic Change to BuSpar 30 mg po bid  PTSD-improving She will continue psychotherapy sessions with Ms. Cecilie Lowers.  Patient has been noncompliant with therapy sessions encouraged her to call and make an appointment.  Panic attacks-improving Propranolol 20 mg p.o. 3 times daily  Tobacco use disorder in early remission We will continue to monitor closely.  Follow-up in clinic in 4 to 6 weeks or sooner if needed.  December 2 at 2:15 PM  I have spent atleast 15 minutes non face to face with patient today. More than 50 % of the time was spent for psychoeducation and supportive psychotherapy and care coordination. This note was generated in part or whole with voice recognition software. Voice recognition is usually quite accurate but there are  transcription errors that can and very often do occur. I apologize for any typographical errors that were not detected and corrected.         Ursula Alert, MD 12/11/2018, 12:05 PM

## 2018-12-11 ENCOUNTER — Encounter: Payer: Self-pay | Admitting: Psychiatry

## 2018-12-11 ENCOUNTER — Other Ambulatory Visit: Payer: Self-pay

## 2018-12-11 ENCOUNTER — Ambulatory Visit (INDEPENDENT_AMBULATORY_CARE_PROVIDER_SITE_OTHER): Payer: Medicare HMO | Admitting: Psychiatry

## 2018-12-11 DIAGNOSIS — F410 Panic disorder [episodic paroxysmal anxiety] without agoraphobia: Secondary | ICD-10-CM

## 2018-12-11 DIAGNOSIS — F3160 Bipolar disorder, current episode mixed, unspecified: Secondary | ICD-10-CM | POA: Diagnosis not present

## 2018-12-11 DIAGNOSIS — F4310 Post-traumatic stress disorder, unspecified: Secondary | ICD-10-CM | POA: Diagnosis not present

## 2018-12-11 DIAGNOSIS — F17200 Nicotine dependence, unspecified, uncomplicated: Secondary | ICD-10-CM

## 2018-12-11 MED ORDER — BUSPIRONE HCL 30 MG PO TABS: 30.0000 mg | ORAL_TABLET | Freq: Two times a day (BID) | ORAL | 1 refills | Status: DC

## 2018-12-18 ENCOUNTER — Encounter: Payer: Self-pay | Admitting: Pain Medicine

## 2018-12-19 ENCOUNTER — Encounter: Payer: Self-pay | Admitting: Pain Medicine

## 2018-12-19 ENCOUNTER — Telehealth: Payer: Self-pay

## 2018-12-25 ENCOUNTER — Ambulatory Visit: Payer: Medicare HMO | Admitting: Pain Medicine

## 2018-12-26 ENCOUNTER — Encounter: Payer: Self-pay | Admitting: Pain Medicine

## 2018-12-29 ENCOUNTER — Telehealth: Payer: Self-pay

## 2018-12-29 DIAGNOSIS — F3160 Bipolar disorder, current episode mixed, unspecified: Secondary | ICD-10-CM

## 2018-12-29 DIAGNOSIS — F410 Panic disorder [episodic paroxysmal anxiety] without agoraphobia: Secondary | ICD-10-CM

## 2018-12-29 DIAGNOSIS — Z91199 Patient's noncompliance with other medical treatment and regimen due to unspecified reason: Secondary | ICD-10-CM

## 2018-12-29 DIAGNOSIS — Z9119 Patient's noncompliance with other medical treatment and regimen: Secondary | ICD-10-CM

## 2018-12-29 DIAGNOSIS — F4310 Post-traumatic stress disorder, unspecified: Secondary | ICD-10-CM

## 2018-12-29 NOTE — Telephone Encounter (Signed)
pt states she can not take the depakote she has a rash all over her and she taken  benadryl and it is not helping she states that her surgery was canceled do to all of this.

## 2018-12-29 NOTE — Telephone Encounter (Signed)
Returned call to patient.  She reports Depakote gave her a rash and she stopped taking it.  The rashes are getting better since she got an injection from [facility].  Patient however reports she needs something for her nerves.  Patient was told multiple times in the past to start psychotherapy sessions.  Patient however has been noncompliant.  Patient today agrees to start therapy sessions again.  Discussed with patient that she can be terminated from the clinic if she does not follow recommendations.  Discussed reducing her BuSpar and adding Prozac.  Patient however reported she just wants to stay on propranolol 3 times a day since she has been taking it only 2 times a day at this time.  She wants to start taking the propranolol more frequently.  Advised patient she could do so.  Encourage patient to make an appointment with a therapist as soon as possible.

## 2018-12-30 ENCOUNTER — Encounter: Payer: Self-pay | Admitting: Pain Medicine

## 2019-01-05 ENCOUNTER — Encounter: Payer: Self-pay | Admitting: Pain Medicine

## 2019-01-11 ENCOUNTER — Encounter: Payer: Self-pay | Admitting: Emergency Medicine

## 2019-01-11 ENCOUNTER — Other Ambulatory Visit: Payer: Self-pay

## 2019-01-11 DIAGNOSIS — E1122 Type 2 diabetes mellitus with diabetic chronic kidney disease: Secondary | ICD-10-CM | POA: Diagnosis not present

## 2019-01-11 DIAGNOSIS — E039 Hypothyroidism, unspecified: Secondary | ICD-10-CM | POA: Diagnosis not present

## 2019-01-11 DIAGNOSIS — N183 Chronic kidney disease, stage 3 unspecified: Secondary | ICD-10-CM | POA: Diagnosis not present

## 2019-01-11 DIAGNOSIS — T8143XA Infection following a procedure, organ and space surgical site, initial encounter: Secondary | ICD-10-CM | POA: Diagnosis not present

## 2019-01-11 DIAGNOSIS — Z20828 Contact with and (suspected) exposure to other viral communicable diseases: Secondary | ICD-10-CM | POA: Diagnosis not present

## 2019-01-11 DIAGNOSIS — Z79899 Other long term (current) drug therapy: Secondary | ICD-10-CM | POA: Diagnosis not present

## 2019-01-11 DIAGNOSIS — Z87891 Personal history of nicotine dependence: Secondary | ICD-10-CM | POA: Insufficient documentation

## 2019-01-11 DIAGNOSIS — I2510 Atherosclerotic heart disease of native coronary artery without angina pectoris: Secondary | ICD-10-CM | POA: Diagnosis not present

## 2019-01-11 DIAGNOSIS — Z8673 Personal history of transient ischemic attack (TIA), and cerebral infarction without residual deficits: Secondary | ICD-10-CM | POA: Diagnosis not present

## 2019-01-11 DIAGNOSIS — Y69 Unspecified misadventure during surgical and medical care: Secondary | ICD-10-CM | POA: Diagnosis not present

## 2019-01-11 DIAGNOSIS — I130 Hypertensive heart and chronic kidney disease with heart failure and stage 1 through stage 4 chronic kidney disease, or unspecified chronic kidney disease: Secondary | ICD-10-CM | POA: Diagnosis not present

## 2019-01-11 DIAGNOSIS — G8918 Other acute postprocedural pain: Secondary | ICD-10-CM | POA: Diagnosis present

## 2019-01-11 DIAGNOSIS — J45909 Unspecified asthma, uncomplicated: Secondary | ICD-10-CM | POA: Diagnosis not present

## 2019-01-11 DIAGNOSIS — I509 Heart failure, unspecified: Secondary | ICD-10-CM | POA: Insufficient documentation

## 2019-01-11 LAB — URINALYSIS, COMPLETE (UACMP) WITH MICROSCOPIC
Bacteria, UA: NONE SEEN
Bilirubin Urine: NEGATIVE
Glucose, UA: NEGATIVE mg/dL
Hgb urine dipstick: NEGATIVE
Ketones, ur: NEGATIVE mg/dL
Leukocytes,Ua: NEGATIVE
Nitrite: NEGATIVE
Protein, ur: NEGATIVE mg/dL
Specific Gravity, Urine: 1.002 — ABNORMAL LOW (ref 1.005–1.030)
Squamous Epithelial / HPF: NONE SEEN (ref 0–5)
WBC, UA: NONE SEEN WBC/hpf (ref 0–5)
pH: 6 (ref 5.0–8.0)

## 2019-01-11 LAB — COMPREHENSIVE METABOLIC PANEL
ALT: 16 U/L (ref 0–44)
AST: 17 U/L (ref 15–41)
Albumin: 3.4 g/dL — ABNORMAL LOW (ref 3.5–5.0)
Alkaline Phosphatase: 81 U/L (ref 38–126)
Anion gap: 12 (ref 5–15)
BUN: 18 mg/dL (ref 6–20)
CO2: 22 mmol/L (ref 22–32)
Calcium: 8.7 mg/dL — ABNORMAL LOW (ref 8.9–10.3)
Chloride: 106 mmol/L (ref 98–111)
Creatinine, Ser: 0.68 mg/dL (ref 0.44–1.00)
GFR calc Af Amer: >60 mL/min (ref >60)
GFR calc non Af Amer: >60 mL/min (ref >60)
Glucose, Bld: 140 mg/dL — ABNORMAL HIGH (ref 70–99)
Potassium: 4.2 mmol/L (ref 3.5–5.1)
Sodium: 140 mmol/L (ref 135–145)
Total Bilirubin: 0.4 mg/dL (ref 0.3–1.2)
Total Protein: 7.5 g/dL (ref 6.5–8.1)

## 2019-01-11 LAB — CBC WITH DIFFERENTIAL/PLATELET
Abs Immature Granulocytes: 0.07 10*3/uL (ref 0.00–0.07)
Basophils Absolute: 0 10*3/uL (ref 0.0–0.1)
Basophils Relative: 0 %
Eosinophils Absolute: 0.2 10*3/uL (ref 0.0–0.5)
Eosinophils Relative: 2 %
HCT: 32.6 % — ABNORMAL LOW (ref 36.0–46.0)
Hemoglobin: 10.5 g/dL — ABNORMAL LOW (ref 12.0–15.0)
Immature Granulocytes: 1 %
Lymphocytes Relative: 20 %
Lymphs Abs: 1.9 10*3/uL (ref 0.7–4.0)
MCH: 29.2 pg (ref 26.0–34.0)
MCHC: 32.2 g/dL (ref 30.0–36.0)
MCV: 90.6 fL (ref 80.0–100.0)
Monocytes Absolute: 0.5 10*3/uL (ref 0.1–1.0)
Monocytes Relative: 6 %
Neutro Abs: 6.8 10*3/uL (ref 1.7–7.7)
Neutrophils Relative %: 71 %
Platelets: 348 10*3/uL (ref 150–400)
RBC: 3.6 MIL/uL — ABNORMAL LOW (ref 3.87–5.11)
RDW: 13.9 % (ref 11.5–15.5)
WBC: 9.5 10*3/uL (ref 4.0–10.5)
nRBC: 0.2 % (ref 0.0–0.2)

## 2019-01-11 LAB — LACTIC ACID, PLASMA: Lactic Acid, Venous: 1.6 mmol/L (ref 0.5–1.9)

## 2019-01-11 NOTE — ED Triage Notes (Signed)
Patient states that she had a hernia repair at [facility] on Nov 20th. Patient states that the pain became worse tonight. Patient states that she has also been nauseated at home. Patient reports temperature of 100.6 at home.

## 2019-01-12 ENCOUNTER — Emergency Department
Admission: EM | Admit: 2019-01-12 | Discharge: 2019-01-12 | Disposition: A | Discharge status: Other Acute Inpatient Hospital | Payer: Medicare HMO | Attending: Emergency Medicine | Admitting: Emergency Medicine

## 2019-01-12 ENCOUNTER — Emergency Department: Payer: Medicare HMO

## 2019-01-12 DIAGNOSIS — T8143XA Infection following a procedure, organ and space surgical site, initial encounter: Secondary | ICD-10-CM | POA: Diagnosis not present

## 2019-01-12 DIAGNOSIS — K651 Peritoneal abscess: Secondary | ICD-10-CM

## 2019-01-12 LAB — SARS CORONAVIRUS 2 (TAT 6-24 HRS): SARS Coronavirus 2: NEGATIVE

## 2019-01-12 MED ORDER — VANCOMYCIN HCL IN DEXTROSE 1-5 GM/200ML-% IV SOLN
1000.0000 mg | Freq: Once | INTRAVENOUS | Status: AC
  Administered 2019-01-12: 1000 mg via INTRAVENOUS
  Filled 2019-01-12: qty 200

## 2019-01-12 MED ORDER — MORPHINE SULFATE (PF) 2 MG/ML IV SOLN
2.0000 mg | Freq: Once | INTRAVENOUS | Status: AC
  Administered 2019-01-12: 2 mg via INTRAVENOUS
  Filled 2019-01-12: qty 1

## 2019-01-12 MED ORDER — IOHEXOL 300 MG/ML  SOLN
100.0000 mL | Freq: Once | INTRAMUSCULAR | Status: AC | PRN
  Administered 2019-01-12: 100 mL via INTRAVENOUS

## 2019-01-12 MED ORDER — SODIUM CHLORIDE 0.9 % IV BOLUS
1000.0000 mL | Freq: Once | INTRAVENOUS | Status: AC
  Administered 2019-01-12: 1000 mL via INTRAVENOUS

## 2019-01-12 MED ORDER — MORPHINE SULFATE (PF) 4 MG/ML IV SOLN
4.0000 mg | Freq: Once | INTRAVENOUS | Status: AC
  Administered 2019-01-12: 4 mg via INTRAVENOUS
  Filled 2019-01-12: qty 1

## 2019-01-12 MED ORDER — ONDANSETRON HCL 4 MG/2ML IJ SOLN
4.0000 mg | Freq: Once | INTRAMUSCULAR | Status: AC
  Administered 2019-01-12: 4 mg via INTRAVENOUS
  Filled 2019-01-12: qty 2

## 2019-01-12 MED ORDER — VANCOMYCIN HCL 10 G IV SOLR: 1.0000 g | Freq: Once | INTRAVENOUS | Status: DC

## 2019-01-12 MED ORDER — VANCOMYCIN HCL IN DEXTROSE 1-5 GM/200ML-% IV SOLN: 1000.0000 mg | Freq: Once | INTRAVENOUS | Status: DC

## 2019-01-12 NOTE — ED Provider Notes (Signed)
[facility] Emergency Department Provider Note _____   First MD Initiated Contact with Patient 01/12/19 0157     (approximate)  I have reviewed the triage vital signs and the nursing notes.   HISTORY  Chief Complaint Abdominal Pain and Post-op Problem   HPI Marie Clarke is a 48 y.o. female with below list of previous medical conditions including recent hernia repair performed at [facility] on 01/02/2019 presents to the emergency department secondary to progressively worsening generalized abdominal discomfort which patient states began on Thanksgiving day.  Patient also admits to nausea however no vomiting.  Patient denies any fever.  Patient denies any urinary symptoms.  Patient states that current pain score is 10 out of 10.        Past Medical History:  Diagnosis Date   Anemia    Anxiety    Asthma    Bipolar disorder ([place])    CAD (coronary artery disease) unk   CHF (congestive heart failure) (HCC)    COPD (chronic obstructive pulmonary disease) (HCC)    Depression unk   Diabetes mellitus without complication ([place])    Diabetes mellitus, type II ([place])    Drug overdose    GERD (gastroesophageal reflux disease)    Headache    Hyperlipidemia    Hypertension    Left leg pain 04/29/2014   MI (myocardial infarction) ([place])    Muscle ache 09/16/2014   Osteoporosis    Overactive bladder    Pancreatitis unk   Reflex sympathetic dystrophy    Renal insufficiency    Sleep apnea    pt reported on 2/6/7 she currently is not using CPAP   Sleep apnea    Stroke [facility])    Thyroid disease    TIA (transient ischemic attack) unk   TIA (transient ischemic attack)     Patient Active Problem List   Diagnosis Date Noted   Noncompliance with treatment regimen 12/29/2018   BMI 40.0-44.9, adult ([place]) 11/19/2018   AKI (acute kidney injury) ([place]) 11/17/2018   Postural dizziness with presyncope 09/29/2018   Bipolar I disorder, most  recent episode mixed ([place]) 09/10/2018   Panic attacks 09/10/2018   Chronic lumbosacral L5-S1 IVD protrusion (Bilateral) 08/25/2018   Lumbar lateral recess stenosis (L5-S1) (Bilateral) 08/25/2018   Wound disruption 08/05/2018   Osteoarthritis involving multiple joints 07/10/2018   Long term current use of non-steroidal anti-inflammatories (NSAID) 07/10/2018   NSAID induced gastritis 07/10/2018   DDD (degenerative disc disease), lumbosacral 04/22/2018   Disease related peripheral neuropathy 11/13/2017   Gastroesophageal reflux disease without esophagitis 11/13/2017   Occipital headache (Bilateral) 11/13/2017   Cervicogenic headache (Bilateral) (L>R) 11/13/2017   History of postoperative nausea 10/22/2017   Chronic shoulder pain (Fifth Area of Pain) (Bilateral) (L>R) 10/09/2017   CRPS (complex regional pain syndrome) type 1 of lower limb (left ankle) 10/09/2017   Ankle joint instability (Left) 10/09/2017   Ankle sprain, sequela (Left) 10/09/2017   History of psychiatric symptoms 10/09/2017   Chronic pain syndrome 10/02/2017   Spondylosis without myelopathy or radiculopathy, lumbosacral region 10/02/2017   Chronic musculoskeletal pain 10/02/2017   Elevated C-reactive protein (CRP) 09/10/2017   Elevated sed rate 09/10/2017   Chronic neck pain (Fourth Area of Pain) (Bilateral) (L>R) 09/09/2017   Pharmacologic therapy 09/09/2017   Disorder of skeletal system 09/09/2017   Problems influencing health status 09/09/2017   Long term current use of opiate analgesic 09/09/2017   Tobacco use disorder 07/16/2017   Ventral hernia without obstruction or gangrene 06/25/2017  Strain of extensor muscle, fascia and tendon of left index finger at wrist and hand level, initial encounter 05/16/2017   Sepsis ([place]) 04/14/2017   Osteopenia 04/03/2017   Hypotension 09/17/2016   Contusion of knee (Left) 10/12/2015   Strain of knee (Left) 10/12/2015   Incidental lung  nodule 04/28/2015   Chronic low back pain (Primary Area of Pain) (Bilateral) (L>R) 03/28/2015   Chronic lower extremity pain (Referred) (Secondary Area of Pain) (Left) 03/28/2015   Abdominal wound dehiscence 03/28/2015   Encounter for pain management planning 03/28/2015   Morbid obesity ([place]) 03/28/2015   Abnormal CT scan, lumbar spine 03/28/2015   Lumbar facet hypertrophy 03/28/2015   Lumbar facet syndrome (Bilateral) (L>R) 03/28/2015   Lumbar foraminal stenosis (Bilateral) (L5-S1) 03/28/2015   Chronic ankle pain (Tertiary Area of Pain) (Left) 03/28/2015   Neurogenic pain 03/28/2015   Neuropathic pain 03/28/2015   Myofascial pain 03/28/2015   History of suicide attempt 03/28/2015   PTSD (post-traumatic stress disorder) 01/13/2015   Abnormal gait 12/15/2014   Congestive heart failure ([place]) 11/15/2014   Abdominal wall abscess 09/20/2014   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   Diabetes mellitus, type 2 ([place]) 08/13/2014   Bipolar affective disorder ([place]) 08/13/2014   Type 2 diabetes mellitus ([place]) 08/13/2014   Rectal prolapse 08/09/2014   Rectal bleeding 08/09/2014   Rectal bleed 08/09/2014   Affective bipolar disorder ([place]) 08/05/2014   Arteriosclerosis of coronary artery 08/05/2014   CCF (congestive cardiac failure) ([place]) 08/05/2014   Chronic kidney disease 08/05/2014   Detrusor muscle hypertonia 08/05/2014   Apnea, sleep 08/05/2014   Temporary cerebral vascular dysfunction 08/05/2014   Polypharmacy 04/29/2014   Other long term (current) drug therapy 04/29/2014   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 Authorizing Provider  atorvastatin (LIPITOR) 40 MG tablet Take 40 mg by mouth at bedtime.     [provider]  busPIRone (BUSPAR) 30 MG tablet Take 1 tablet (30 mg total) by mouth 2 (two) times daily. 12/11/18   Ursula Alert, MD  diphenhydrAMINE (BENADRYL) 25 mg capsule TAKE 1 CAPSULE BY MOUTH TWICE DAILY AS NEEDED FOR ITCHING 11/17/18   Ursula Alert, MD  diphenoxylate-atropine (LOMOTIL) 2.5-0.025 MG tablet Take by mouth. 12/25/17   [provider]  divalproex (DEPAKOTE) 500 MG DR tablet  12/03/18   [provider]  Dulaglutide 0.75 MG/0.5ML SOPN Inject 0.75 mg into the skin every Saturday.  07/15/18   [provider]  furosemide (LASIX) 20 MG tablet Take 20 mg by mouth 2 (two) times daily.    [provider]  KLOR-CON M20 20 MEQ tablet Take 0.5 tablets (10 mEq total) by mouth 2 (two) times daily. Patient taking differently: Take 20 mEq by mouth daily.  11/18/18   Mayo, Pete Pelt, MD  Lancets (ACCU-CHEK SAFE-T PRO) lancets  11/21/18   [provider]  levocetirizine (XYZAL) 5 MG tablet Take 5 mg by mouth daily.  06/04/18   [provider]  levothyroxine (  SYNTHROID) 25 MCG tablet  10/01/18   [provider]  levothyroxine (SYNTHROID) 50 MCG tablet Take 50 mcg by mouth daily. (251mcg total daily) 09/03/18   [provider]  levothyroxine (SYNTHROID, LEVOTHROID) 200 MCG tablet Take 200 mcg by mouth daily. (279mcg total daily)    [provider]  Magnesium Oxide 500 MG CAPS Take 1 capsule (500 mg total) by mouth 2 (two) times daily at 8 am and 10 pm. 10/28/18 04/26/19  Milinda Pointer, MD  meloxicam [facility]) 15 MG tablet  11/14/18   [provider]  methocarbamol (ROBAXIN) 750 MG tablet Take 1  tablet (750 mg total) by mouth every 8 (eight) hours as needed for muscle spasms. 10/28/18 04/26/19  Milinda Pointer, MD  montelukast (SINGULAIR) 10 MG tablet Take 10 mg by mouth daily.     [provider]  MYRBETRIQ 50 MG TB24 tablet Take 50 mg by mouth daily.     [provider]  naproxen (NAPROSYN) 500 MG tablet  11/20/18   [provider]  nitroGLYCERIN (NITROSTAT) 0.4 MG SL tablet Place 0.4 mg under the tongue every 5 (five) minutes as needed for chest pain.     [provider]  omeprazole (PRILOSEC) 20 MG capsule Take 1 capsule (20 mg total) by mouth at bedtime. 10/28/18 04/26/19  Milinda Pointer, MD  ondansetron (ZOFRAN-ODT) 8 MG disintegrating tablet Take 8 mg by mouth every 8 (eight) hours as needed for nausea or vomiting.    [provider]  pregabalin (LYRICA) 150 MG capsule Take 1 capsule (150 mg total) by mouth 2 (two) times daily. 11/19/18 04/13/19  Milinda Pointer, MD  propranolol (INDERAL) 20 MG tablet TAKE 1 TABLET BY MOUTH 3 TIMES A DAY AS NEEDED FOR ANXIETY Patient taking differently: Take 20 mg by mouth 3 (three) times daily as needed (anxiety).  11/14/18   Ursula Alert, MD  QUEtiapine (SEROQUEL) 100 MG tablet Take 1-1.5 tablets (100-150 mg total) by mouth at bedtime as needed (sleep). 11/28/18   Ursula Alert, MD  rizatriptan (MAXALT) 10 MG tablet Take 1 tablet (10 mg total) by mouth as needed. Patient taking differently: Take 10 mg by mouth as needed for migraine.  06/03/18   Vevelyn Francois, NP  torsemide (DEMADEX) 20 MG tablet Take 1 tablet (20 mg total) by mouth 2 (two) times daily. 11/18/18 12/18/18  Mayo, Pete Pelt, MD  traMADol (ULTRAM) 50 MG tablet Take 1-2 tablets (50-100 mg total) by mouth every 6 (six) hours. 12/17/18 04/16/19  Milinda Pointer, MD  TRELEGY ELLIPTA 100-62.5-25 MCG/INH AEPB Inhale 1 puff into the lungs daily.     [provider]  VENTOLIN HFA 108 (90 Base) MCG/ACT inhaler Inhale 1-2 puffs into the  lungs every 6 (six) hours as needed for wheezing or shortness of breath.  05/03/18   [provider]  vitamin E 400 UNIT capsule vitamin e 400 iu synthetic    [provider]    Allergies Diazepam, Levofloxacin, Metronidazole, Sulfa antibiotics, Cephalexin, Ciprofloxacin, Depakote [divalproex sodium], Ziprasidone hcl, and Penicillins  Family History  Problem Relation Age of Onset   Diabetes Mellitus II Mother    CAD Mother    Sleep apnea Mother    Osteoarthritis Mother    Osteoporosis Mother    Anxiety disorder Mother    Depression Mother    Bipolar disorder Mother    Bipolar disorder Father    Hypertension Father    Depression Father    Anxiety disorder Father    Post-traumatic  stress disorder Sister     Social History Social History   Tobacco Use   Smoking status: Former Smoker    Packs/day: 0.50    Types: Cigarettes    Quit date: 09/01/2018    Years since quitting: 0.3   Smokeless tobacco: Never Used   Tobacco comment: Patient quit smoking 09/01/2018  Substance Use Topics   Alcohol use: No    Alcohol/week: 0.0 standard drinks   Drug use: No    Review of Systems Constitutional: No fever/chills Eyes: No visual changes. ENT: No sore throat. Cardiovascular: Denies chest pain. Respiratory: Denies shortness of breath. Gastrointestinal: Positive for abdominal pain and nausea no vomiting.  No diarrhea.  No constipation. Genitourinary: Negative for dysuria. Musculoskeletal: Negative for neck pain.  Negative for back pain. Integumentary: Negative for rash. Neurological: Negative for headaches, focal weakness or numbness.   ____________________________________________   PHYSICAL EXAM:  VITAL SIGNS: ED Triage Vitals [01/11/19 2220]  Enc Vitals Group     BP 103/87     Pulse Rate (!) 110     Resp 20     Temp 98.8 F (37.1 C)     Temp Source Oral     SpO2 96 %     Weight 102.1 kg (225 lb)     Height 1.626 m (5\' 4" )     Head  Circumference      Peak Flow      Pain Score 10     Pain Loc      Pain Edu?      Excl. in [place]?     Constitutional: Alert and oriented.  Eyes: Conjunctivae are normal.  Mouth/Throat: Patient is wearing a mask. Neck: No stridor.  No meningeal signs.   Cardiovascular: Normal rate, regular rhythm. Good peripheral circulation. Grossly normal heart sounds. Respiratory: Normal respiratory effort.  No retractions. Gastrointestinal: Generalized tenderness to palpation.  No distention.  Approximately 6 mL of serosanguineous drainage noted in the JP drain. Musculoskeletal: No lower extremity tenderness nor edema. No gross deformities of extremities. Neurologic:  Normal speech and language. No gross focal neurologic deficits are appreciated.  Skin:  Skin is warm, dry and intact. Psychiatric: Mood and affect are normal. Speech and behavior are normal.  ____________________________________________   LABS (all labs ordered are listed, but only abnormal results are displayed)  Labs Reviewed  COMPREHENSIVE METABOLIC PANEL - Abnormal; Notable for the following components:      Result Value   Glucose, Bld 140 (*)    Calcium 8.7 (*)    Albumin 3.4 (*)    All other components within normal limits  CBC WITH DIFFERENTIAL/PLATELET - Abnormal; Notable for the following components:   RBC 3.60 (*)    Hemoglobin 10.5 (*)    HCT 32.6 (*)    All other components within normal limits  URINALYSIS, COMPLETE (UACMP) WITH MICROSCOPIC - Abnormal; Notable for the following components:   Color, Urine COLORLESS (*)    APPearance CLEAR (*)    Specific Gravity, Urine 1.002 (*)    All other components within normal limits  SARS CORONAVIRUS 2 (TAT 6-24 HRS)  LACTIC ACID, PLASMA     RADIOLOGY I, [place] Jah[place]el Stroschein, personally viewed and evaluated these images (plain radiographs) as part of my medical decision making, as well as reviewing the written report by the radiologist.  ED MD interpretation: 9 x 14 cm  abscess/seroma noted below the abdominal wall fascia  Official radiology report(s): Ct Abdomen Pelvis W Contrast  Result Date: 01/12/2019 CLINICAL  DATA:  Abdominal pain, status post hernia repair 01/02/2019 EXAM: CT ABDOMEN AND PELVIS WITH CONTRAST TECHNIQUE: Multidetector CT imaging of the abdomen and pelvis was performed using the standard protocol following bolus administration of intravenous contrast. CONTRAST:  134mL OMNIPAQUE IOHEXOL 300 MG/ML  SOLN COMPARISON:  None. FINDINGS: Lower chest: The visualized heart size within normal limits. No pericardial fluid/thickening. No hiatal hernia. The visualized portions of the lungs are clear. Hepatobiliary: The liver is normal in density without focal abnormality.The main portal vein is patent. No evidence of calcified gallstones, gallbladder wall thickening or biliary dilatation. Pancreas: Unremarkable. No pancreatic ductal dilatation or surrounding inflammatory changes. Spleen: Normal in size without focal abnormality. Adrenals/Urinary Tract: Both adrenal glands appear normal. The kidneys and collecting system appear normal without evidence of urinary tract calculus or hydronephrosis. Bladder is unremarkable. Stomach/Bowel: The stomach, small bowel, and colon are normal in appearance. No inflammatory changes, wall thickening, or obstructive findings.The appendix is normal. Vascular/Lymphatic: There are no enlarged mesenteric, retroperitoneal, or pelvic lymph nodes. No significant vascular findings are present. Reproductive: The patient is status post hysterectomy. No adnexal masses or collections seen. Other: Along the anterior abdominal wall there is overlying surgical staples and significant fat stranding changes. A pigtail catheter seen within the subcutaneous fat. Along the lower anterior abdominal wall adjacent to the significant fat stranding changes, there is a loculated intraperitoneal fluid collection extending from the deep fascial layer measuring  approximately 9.0 x 3.8 by 14.1 cm and tracking into the anterior lower the pelvis. There is a small amount of subcutaneous emphysema and pneumoperitoneum best seen on series 2, image 49 in the mid anterior abdomen. Musculoskeletal: No acute or significant osseous findings. IMPRESSION: 1. Postsurgical changes along the anterior lower abdominal wall with a pigtail catheter in the subcutaneous fat. 2. Beneath the lower anterior abdominal wall fascia there is a multilocular intra-abdominal abscess/seroma extending into the deep pelvis measuring 9.0 x 3.8 x 14.1 cm. 3. Small amount of subcutaneous emphysema and pneumoperitoneum along the upper anterior abdomen, likely postsurgical. Electronically Signed   By: Prudencio Pair M.D.   On: 01/12/2019 03:24     Procedures   ____________________________________________   INITIAL IMPRESSION / MDM / [place] / ED COURSE  As part of my medical decision making, I reviewed the following data within the electronic MEDICAL RECORD NUMBER   48 year old female presenting with above-stated history and physical exam concerning for possible intra-abdominal abscess.  CT abdomen pelvis performed revealed possible abscess versus seroma.  Patient discussed with Dr. Mariea Clonts at [facility] who accepted the patient in transfer.  Patient given IV vancomycin 1 g in the emergency department. ____________________________________________  FINAL CLINICAL IMPRESSION(S) / ED DIAGNOSES  Final diagnoses:  Intra-abdominal abscess ([place])     MEDICATIONS GIVEN DURING THIS VISIT:  Medications  vancomycin (VANCOCIN) IVPB 1000 mg/200 mL premix (has no administration in time range)  morphine 2 MG/ML injection 2 mg (2 mg Intravenous Given 01/12/19 0218)  ondansetron (ZOFRAN) injection 4 mg (4 mg Intravenous Given 01/12/19 0218)  iohexol (OMNIPAQUE) 300 MG/ML solution 100 mL (100 mLs Intravenous Contrast Given 01/12/19 0315)     ED Discharge Orders    None      *Please note:  Sebastiana Shafran was evaluated in Emergency Department on 01/12/2019 for the symptoms described in the history of present illness. She was evaluated in the context of the global COVID-19 pandemic, which necessitated consideration that the patient might be at risk for infection with the SARS-CoV-2 virus that  causes COVID-19. Institutional protocols and algorithms that pertain to the evaluation of patients at risk for COVID-19 are in a state of rapid change based on information released by regulatory bodies including the CDC and federal and state organizations. These policies and algorithms were followed during the patient's care in the ED.  Some ED evaluations and interventions may be delayed as a result of limited staffing during the pandemic.*  Note:  This document was prepared using Dragon voice recognition software and may include unintentional dictation errors.   Gregor Hams, MD 01/12/19 [phone]

## 2019-01-12 NOTE — ED Notes (Signed)
Pt updated on transfer process. Pt requesting to sit in chair. Pt assisted out of bed to chair.

## 2019-01-14 ENCOUNTER — Ambulatory Visit: Payer: Medicare HMO | Admitting: Psychiatry

## 2019-01-20 ENCOUNTER — Ambulatory Visit (INDEPENDENT_AMBULATORY_CARE_PROVIDER_SITE_OTHER): Payer: Medicare HMO | Admitting: Psychiatry

## 2019-01-20 ENCOUNTER — Encounter: Payer: Self-pay | Admitting: Psychiatry

## 2019-01-20 ENCOUNTER — Other Ambulatory Visit: Payer: Self-pay

## 2019-01-20 DIAGNOSIS — F410 Panic disorder [episodic paroxysmal anxiety] without agoraphobia: Secondary | ICD-10-CM

## 2019-01-20 DIAGNOSIS — Z9119 Patient's noncompliance with other medical treatment and regimen: Secondary | ICD-10-CM | POA: Diagnosis not present

## 2019-01-20 DIAGNOSIS — Z91199 Patient's noncompliance with other medical treatment and regimen due to unspecified reason: Secondary | ICD-10-CM

## 2019-01-20 DIAGNOSIS — F3160 Bipolar disorder, current episode mixed, unspecified: Secondary | ICD-10-CM | POA: Diagnosis not present

## 2019-01-20 DIAGNOSIS — F4310 Post-traumatic stress disorder, unspecified: Secondary | ICD-10-CM | POA: Diagnosis not present

## 2019-01-20 DIAGNOSIS — F17200 Nicotine dependence, unspecified, uncomplicated: Secondary | ICD-10-CM

## 2019-01-20 MED ORDER — HYDROXYZINE HCL 25 MG PO TABS: 25.0000 mg | ORAL_TABLET | Freq: Four times a day (QID) | ORAL | 1 refills | Status: DC | PRN

## 2019-01-20 NOTE — Progress Notes (Signed)
Virtual Visit via Telephone Note  I connected with Marie Clarke on 01/20/19 at  3:00 PM EST by telephone and verified that I am speaking with the correct person using two identifiers.   I discussed the limitations, risks, security and privacy concerns of performing an evaluation and management service by telephone and the availability of in person appointments. I also discussed with the patient that there may be a patient responsible charge related to this service. The patient expressed understanding and agreed to proceed.      I discussed the assessment and treatment plan with the patient. The patient was provided an opportunity to ask questions and all were answered. The patient agreed with the plan and demonstrated an understanding of the instructions.   The patient was advised to call back or seek an in-person evaluation if the symptoms worsen or if the condition fails to improve as anticipated.   Carl MD OP Progress Note  01/20/2019 6:20 PM Monteria Hasselman  MRN:  [id number]  Chief Complaint:  Chief Complaint    Follow-up     HPI: Marie Clarke is a 48 year old Caucasian female, single, on SSD, lives in [place], has a history of bipolar disorder, PTSD, panic attacks, coronary artery disease, OSA on CPAP, COPD, hyperlipidemia, hypertension, hypothyroidism, back pain was evaluated by phone today.  Patient preferred to do a phone call.  Patient today reports she had recent surgery for hernia repair as well as possible complication to it.  She reports she is currently recovering.  She is taking oxycodone as needed for pain which helps.  She reports while she was admitted to the hospital for the surgery she was started back on hydroxyzine as needed for anxiety and that has been helpful.  She reports she hence wants to stay on the hydroxyzine and does not want the propranolol at this time.  She has been compliant on her other medications including her mood stabilizer.  Patient denies any significant  mood swings at this time.  Patient has not been compliant with therapy sessions as discussed however agrees today to call her therapist to make an appointment.  Patient denies any other concerns today. Visit Diagnosis:    ICD-10-CM   1. Bipolar I disorder, most recent episode mixed ([place])  F31.60    improving  2. PTSD (post-traumatic stress disorder)  F43.10   3. Panic attacks  F41.0 hydrOXYzine (ATARAX/VISTARIL) 25 MG tablet  4. Noncompliance with treatment regimen  Z91.19   5. Tobacco use disorder  F17.200     Past Psychiatric History: I have reviewed past psychiatric history from my progress note on 03/06/2017.  Past Medical History:  Past Medical History:  Diagnosis Date  . Anemia   . Anxiety   . Asthma   . Bipolar disorder ([place])   . CAD (coronary artery disease) unk  . CHF (congestive heart failure) ([place])   . COPD (chronic obstructive pulmonary disease) ([place])   . Depression unk  . Diabetes mellitus without complication ([place])   . Diabetes mellitus, type II ([place])   . Drug overdose   . GERD (gastroesophageal reflux disease)   . Headache   . Hyperlipidemia   . Hypertension   . Left leg pain 04/29/2014  . MI (myocardial infarction) ([place])   . Muscle ache 09/16/2014  . Osteoporosis   . Overactive bladder   . Pancreatitis unk  . Reflex sympathetic dystrophy   . Renal insufficiency   . Sleep apnea    pt reported on 2/6/7 she currently is  not using CPAP  . Sleep apnea   . Stroke ([place])   . Thyroid disease   . TIA (transient ischemic attack) unk  . TIA (transient ischemic attack)     Past Surgical History:  Procedure Laterality Date  . ABDOMINAL HYSTERECTOMY    . HERNIA REPAIR  07/15/2017   [facility]  . prolapse rectum surgery N/A July 2016  . TONSILLECTOMY      Family Psychiatric History: I have reviewed family psychiatric history from my progress note on 03/06/2017.  Family History:  Family History  Problem Relation Age of Onset  . Diabetes Mellitus II Mother   . CAD  Mother   . Sleep apnea Mother   . Osteoarthritis Mother   . Osteoporosis Mother   . Anxiety disorder Mother   . Depression Mother   . Bipolar disorder Mother   . Bipolar disorder Father   . Hypertension Father   . Depression Father   . Anxiety disorder Father   . Post-traumatic stress disorder Sister     Social History: I have reviewed social history from my progress note on 03/06/2017. Social History   Socioeconomic History  . Marital status: Single    Spouse name: Not on file  . Number of children: 0  . Years of education: Not on file  . Highest education level: High school graduate  Occupational History    Comment: not employed  Social Needs  . Financial resource strain: Not hard at all  . Food insecurity    Worry: Never true    Inability: Never true  . Transportation needs    Medical: Yes    Non-medical: Yes  Tobacco Use  . Smoking status: Former Smoker    Packs/day: 0.50    Types: Cigarettes    Quit date: 09/01/2018    Years since quitting: 0.3  . Smokeless tobacco: Never Used  . Tobacco comment: Patient quit smoking 09/01/2018  Substance and Sexual Activity  . Alcohol use: No    Alcohol/week: 0.0 standard drinks  . Drug use: No  . Sexual activity: Not Currently  Lifestyle  . Physical activity    Days per week: 0 days    Minutes per session: 0 min  . Stress: Not at all  Relationships  . Social connections    Talks on phone: More than three times a week    Gets together: More than three times a week    Attends religious service: More than 4 times per year    Active member of club or organization: No    Attends meetings of clubs or organizations: Never    Relationship status: Never married  Other Topics Concern  . Not on file  Social History Narrative  . Not on file    Allergies:  Allergies  Allergen Reactions  . Azithromycin Hives  . Diazepam Hives and Nausea And Vomiting  . Levofloxacin Hives and Rash    1/31: would like to retry since not  taking valium  . Metronidazole Hives    1/31: would like to retry since she is no longer taking valium  . Sulfa Antibiotics Hives  . Valproic Acid Rash  . Ziprasidone Hcl Other (See Comments) and Itching    CONFUSED CONFUSED Other reaction(s): Other (See Comments) CONFUSED CONFUSED   . Divalproex Sodium Itching  . Cephalexin Hives  . Ciprofloxacin Hives  . Penicillins Nausea And Vomiting    Has patient had a PCN reaction causing immediate rash, facial/tongue/throat swelling, SOB or lightheadedness with hypotension:  No Has patient had a PCN reaction causing severe rash involving mucus membranes or skin necrosis: No Has patient had a PCN reaction that required hospitalization: No Has patient had a PCN reaction occurring within the last 10 years: No If all of the above answers are "NO", then may proceed with Cephalosporin use.     Metabolic Disorder Labs: Lab Results  Component Value Date   HGBA1C 6.3 (H) 09/29/2018   MPG 134.11 09/29/2018   No results found for: PROLACTIN No results found for: CHOL, TRIG, HDL, CHOLHDL, VLDL, LDLCALC Lab Results  Component Value Date   TSH 2.296 11/17/2018   TSH 3.335 09/29/2018    Therapeutic Level Labs: No results found for: LITHIUM Lab Results  Component Value Date   VALPROATE 33 (L) 09/29/2018   No components found for:  CBMZ  Current Medications: Current Outpatient Medications  Medication Sig Dispense Refill  . amoxicillin-clavulanate (AUGMENTIN) 875-125 MG tablet Take by mouth.    Marland Kitchen ipratropium (ATROVENT) 0.03 % nasal spray 1 spray by Each Nare route Two (2) times a day.    . Olopatadine HCl 0.2 % SOLN Apply to eye.    . solifenacin (VESICARE) 5 MG tablet Take by mouth.    Marland Kitchen amoxicillin-clavulanate (AUGMENTIN) 875-125 MG tablet     . aspirin 81 MG chewable tablet Chew by mouth.    Marland Kitchen atorvastatin (LIPITOR) 40 MG tablet Take 40 mg by mouth at bedtime.     . B Complex-C (B-COMPLEX WITH VITAMIN C) tablet Take by mouth.    .  busPIRone (BUSPAR) 30 MG tablet Take 1 tablet (30 mg total) by mouth 2 (two) times daily. 180 tablet 1  . Cholecalciferol (VITAMIN D3) 125 MCG (5000 UT) TABS Take by mouth.    . colchicine (COLCRYS) 0.6 MG tablet Colcrys 0.6 mg tablet    . COLCRYS 0.6 MG tablet     . dicyclomine (BENTYL) 10 MG capsule dicyclomine 10 mg capsule    . diphenoxylate-atropine (LOMOTIL) 2.5-0.025 MG tablet Take by mouth.    . doxycycline (VIBRA-TABS) 100 MG tablet doxycycline hyclate 100 mg tablet    . doxycycline (VIBRAMYCIN) 100 MG capsule doxycycline hyclate 100 mg capsule    . Dulaglutide 0.75 MG/0.5ML SOPN Inject 0.75 mg into the skin every Saturday.     . fluconazole (DIFLUCAN) 150 MG tablet fluconazole 150 mg tablet    . furosemide (LASIX) 20 MG tablet Take 20 mg by mouth 2 (two) times daily.    Marland Kitchen HYDROcodone-acetaminophen (NORCO/VICODIN) 5-325 MG tablet hydrocodone 5 mg-acetaminophen 325 mg tablet    . hydrOXYzine (ATARAX/VISTARIL) 25 MG tablet Take 1 tablet (25 mg total) by mouth every 6 (six) hours as needed. 120 tablet 1  . ipratropium (ATROVENT) 0.03 % nasal spray     . KLOR-CON M20 20 MEQ tablet Take 0.5 tablets (10 mEq total) by mouth 2 (two) times daily. (Patient taking differently: Take 20 mEq by mouth daily. ) 30 tablet 0  . Lancets (ACCU-CHEK SAFE-T PRO) lancets     . levocetirizine (XYZAL) 5 MG tablet Take 5 mg by mouth daily.     Marland Kitchen levothyroxine (SYNTHROID) 50 MCG tablet Take 50 mcg by mouth daily. (219mcg total daily)    . levothyroxine (SYNTHROID, LEVOTHROID) 200 MCG tablet Take 200 mcg by mouth daily. (261mcg total daily)    . lisinopril (ZESTRIL) 5 MG tablet lisinopril 5 mg tablet    . Magnesium Oxide 500 MG CAPS Take 1 capsule (500 mg total) by mouth 2 (two) times  daily at 8 am and 10 pm. 60 capsule 5  . meloxicam (MOBIC) 15 MG tablet     . metFORMIN (GLUCOPHAGE-XR) 500 MG 24 hr tablet metformin ER 500 mg tablet,extended release 24 hr    . methocarbamol (ROBAXIN) 750 MG tablet Take 1 tablet  (750 mg total) by mouth every 8 (eight) hours as needed for muscle spasms. 90 tablet 5  . montelukast (SINGULAIR) 10 MG tablet Take 10 mg by mouth daily.     Marland Kitchen MYRBETRIQ 50 MG TB24 tablet Take 50 mg by mouth daily.     . naproxen (NAPROSYN) 500 MG tablet     . nitroGLYCERIN (NITROSTAT) 0.4 MG SL tablet Place 0.4 mg under the tongue every 5 (five) minutes as needed for chest pain.     Marland Kitchen nystatin (NYSTATIN) powder Nystop 100,000 unit/gram topical powder    . Olopatadine HCl 0.2 % SOLN     . omeprazole (PRILOSEC) 20 MG capsule Take 1 capsule (20 mg total) by mouth at bedtime. 30 capsule 5  . ondansetron (ZOFRAN-ODT) 8 MG disintegrating tablet Take 8 mg by mouth every 8 (eight) hours as needed for nausea or vomiting.    Marland Kitchen oseltamivir (TAMIFLU) 75 MG capsule oseltamivir 75 mg capsule    . oxyCODONE (OXY IR/ROXICODONE) 5 MG immediate release tablet     . oxyCODONE (OXY IR/ROXICODONE) 5 MG immediate release tablet Take by mouth.    . polyethylene glycol-electrolytes (NULYTELY/GOLYTELY) 420 g solution peg-electrolyte solution 420 gram oral solution    . pregabalin (LYRICA) 150 MG capsule Take 1 capsule (150 mg total) by mouth 2 (two) times daily. 290 capsule 0  . prochlorperazine (COMPAZINE) 10 MG tablet prochlorperazine maleate 10 mg tablet    . promethazine (PHENERGAN) 25 MG tablet promethazine 25 mg tablet    . QUEtiapine (SEROQUEL) 100 MG tablet Take 1-1.5 tablets (100-150 mg total) by mouth at bedtime as needed (sleep). 135 tablet 1  . rizatriptan (MAXALT) 10 MG tablet Take 1 tablet (10 mg total) by mouth as needed. (Patient taking differently: Take 10 mg by mouth as needed for migraine. ) 10 tablet 2  . terconazole (TERAZOL 3) 0.8 % vaginal cream terconazole 0.8 % vaginal cream    . torsemide (DEMADEX) 20 MG tablet Take 1 tablet (20 mg total) by mouth 2 (two) times daily. 60 tablet 0  . traMADol (ULTRAM) 50 MG tablet Take 1-2 tablets (50-100 mg total) by mouth every 6 (six) hours. 240 tablet 3  .  TRELEGY ELLIPTA 100-62.5-25 MCG/INH AEPB Inhale 1 puff into the lungs daily.     . VENTOLIN HFA 108 (90 Base) MCG/ACT inhaler Inhale 1-2 puffs into the lungs every 6 (six) hours as needed for wheezing or shortness of breath.     . vitamin E 400 UNIT capsule vitamin e 400 iu synthetic     No current facility-administered medications for this visit.      Musculoskeletal: Strength & Muscle Tone: UTA Gait & Station: Reports as WNL Patient leans: N/A  Psychiatric Specialty Exam: Review of Systems  Musculoskeletal:       S/p Hernia repair surgery - currently has pain  Psychiatric/Behavioral: The patient is nervous/anxious.   All other systems reviewed and are negative.   There were no vitals taken for this visit.There is no height or weight on file to calculate BMI.  General Appearance: UTA  Eye Contact:  UTA  Speech:  Clear and Coherent  Volume:  Normal  Mood:  Anxious  Affect:  UTA  Thought Process:  Goal Directed and Descriptions of Associations: Intact  Orientation:  Full (Time, Place, and Person)  Thought Content: Logical   Suicidal Thoughts:  No  Homicidal Thoughts:  No  Memory:  Immediate;   Fair Recent;   Fair Remote;   Fair  Judgement:  Fair  Insight:  Fair  Psychomotor Activity:  UTA  Concentration:  Concentration: Fair and Attention Span: Fair  Recall:  [employer] of Knowledge: Fair  Language: Fair  Akathisia:  No  Handed:  Right  AIMS (if indicated): denies tremors, rigidity  Assets:  Communication Skills Desire for Improvement Housing Social Support  ADL's:  Intact  Cognition: WNL  Sleep:  Fair   Screenings: PHQ2-9     Procedure visit from 04/22/2018 in [facility] Procedure visit from 03/13/2018 in [place] [facility] Office Visit from 12/23/2017 in [facility] Procedure visit from 12/05/2017 in [place] Office Visit from 11/13/2017 in [facility]  PHQ-2 Total Score  0  0  0  0  0       Assessment and Plan: Marie Clarke is a 48 year old Caucasian female who has a history of bipolar disorder, panic attacks, tobacco use disorder, multiple medical problems was evaluated by telemedicine today.  Patient is currently making progress on the current medication regimen although she struggles with anxiety symptoms on and off.  Patient will benefit from psychotherapy sessions however has been noncompliant.  Plan as noted below.  Plan Bipolar disorder-improving Seroquel 150 mg p.o. nightly. BuSpar 30 mg p.o. twice daily  PTSD-improving Patient advised to restart psychotherapy sessions-she has been noncompliant  Panic attacks-improving Hydroxyzine 25 mg p.o. every 6 hourly as needed.  Patient advised to limit use due to cognitive side effects.  Tobacco use disorder in early remission We will continue to monitor closely   Follow-up in clinic in 1 to 2 months or sooner if needed.  February 10 at 2 PM  I have spent atleast 15 minutes non face to face with patient today. More than 50 % of the time was spent for psychoeducation and supportive psychotherapy and care coordination. This note was generated in part or whole with voice recognition software. Voice recognition is usually quite accurate but there are transcription errors that can and very often do occur. I apologize for any typographical errors that were not detected and corrected.        Ursula Alert, MD 01/20/2019, 6:20 PM

## 2019-01-21 ENCOUNTER — Encounter: Payer: Self-pay | Admitting: Pain Medicine

## 2019-01-21 ENCOUNTER — Ambulatory Visit: Payer: Medicare HMO | Attending: Pain Medicine | Admitting: Pain Medicine

## 2019-01-21 DIAGNOSIS — T39395A Adverse effect of other nonsteroidal anti-inflammatory drugs [NSAID], initial encounter: Secondary | ICD-10-CM

## 2019-01-21 DIAGNOSIS — R519 Headache, unspecified: Secondary | ICD-10-CM

## 2019-01-21 DIAGNOSIS — M792 Neuralgia and neuritis, unspecified: Secondary | ICD-10-CM

## 2019-01-21 DIAGNOSIS — K2960 Other gastritis without bleeding: Secondary | ICD-10-CM

## 2019-01-21 DIAGNOSIS — G894 Chronic pain syndrome: Secondary | ICD-10-CM

## 2019-01-21 DIAGNOSIS — M7918 Myalgia, other site: Secondary | ICD-10-CM

## 2019-01-21 DIAGNOSIS — M150 Primary generalized (osteo)arthritis: Secondary | ICD-10-CM

## 2019-01-21 DIAGNOSIS — M109 Gout, unspecified: Secondary | ICD-10-CM | POA: Insufficient documentation

## 2019-01-21 DIAGNOSIS — M8949 Other hypertrophic osteoarthropathy, multiple sites: Secondary | ICD-10-CM

## 2019-01-21 DIAGNOSIS — K219 Gastro-esophageal reflux disease without esophagitis: Secondary | ICD-10-CM

## 2019-01-21 DIAGNOSIS — G90522 Complex regional pain syndrome I of left lower limb: Secondary | ICD-10-CM

## 2019-01-21 DIAGNOSIS — Z791 Long term (current) use of non-steroidal anti-inflammatories (NSAID): Secondary | ICD-10-CM

## 2019-01-21 DIAGNOSIS — M159 Polyosteoarthritis, unspecified: Secondary | ICD-10-CM

## 2019-01-21 DIAGNOSIS — M1A9XX0 Chronic gout, unspecified, without tophus (tophi): Secondary | ICD-10-CM

## 2019-01-21 DIAGNOSIS — G8929 Other chronic pain: Secondary | ICD-10-CM

## 2019-01-21 MED ORDER — MAGNESIUM OXIDE -MG SUPPLEMENT 500 MG PO CAPS: 1.0000 | ORAL_CAPSULE | Freq: Two times a day (BID) | ORAL | 11 refills | Status: DC

## 2019-01-21 MED ORDER — PREGABALIN 150 MG PO CAPS: 150.0000 mg | ORAL_CAPSULE | Freq: Two times a day (BID) | ORAL | 5 refills | Status: DC

## 2019-01-21 MED ORDER — OMEPRAZOLE 20 MG PO CPDR: 20.0000 mg | DELAYED_RELEASE_CAPSULE | Freq: Every day | ORAL | 11 refills | Status: DC

## 2019-01-21 MED ORDER — METHOCARBAMOL 750 MG PO TABS: 750.0000 mg | ORAL_TABLET | Freq: Three times a day (TID) | ORAL | 5 refills | Status: DC | PRN

## 2019-01-21 MED ORDER — CELECOXIB 100 MG PO CAPS: 100.0000 mg | ORAL_CAPSULE | Freq: Two times a day (BID) | ORAL | 5 refills | Status: DC

## 2019-01-21 MED ORDER — RIZATRIPTAN BENZOATE 10 MG PO TABS: 10.0000 mg | ORAL_TABLET | ORAL | 5 refills | Status: DC | PRN

## 2019-01-21 MED ORDER — TRAMADOL HCL 50 MG PO TABS: 50.0000 mg | ORAL_TABLET | Freq: Four times a day (QID) | ORAL | 5 refills | Status: DC

## 2019-01-21 NOTE — Patient Instructions (Signed)
____________________________________________________________________________________________  Gout    Mechanism: Uric acid accumulation.    Uric Acid: Uric acid is a heterocyclic compound of carbon, nitrogen, oxygen, and hydrogen with the formula [id number]. It forms ions and salts known as urates and acid urates such as ammonium acid urate. Uric acid is a product of the metabolic breakdown of purine nucleotides. High blood concentrations of uric acid can lead to gout. The chemical is associated with other medical conditions including diabetes and the formation of ammonium acid urate kidney stones.    Purines: Purines are found in high concentration in meat and meat products, especially internal organs such as liver and kidney. In general, plant-based diets are low in purines. Examples of high-purine sources include: sweetbreads, anchovies, sardines, liver, beef kidneys, brains, meat extracts (e.g., Oxo, Bovril), herring, mackerel, scallops, game meats, beer (from the yeast) and gravy. A moderate amount of purine is also contained in beef, pork, poultry, other fish and seafood, asparagus, cauliflower, spinach, mushrooms, green peas, lentils, dried peas, beans, oatmeal, wheat bran, wheat germ, and hawthorn. Higher levels of meat and seafood consumption are associated with an increased risk of gout, whereas a higher level of consumption of dairy products is associated with a decreased risk. Moderate intake of purine-rich vegetables or protein is not associated with an increased risk of gout.    Causes: Uric acid is generated as the body's tissues are broken down during normal cell turnover. Some people with gout generate too much uric acid (10%). Other patients with gout do not effectively eliminate their uric acid into the urine (90%). Genetics, gender,and nutrition (alcoholism, obesity) play key roles in the development of gout.   1. If your parents have gout, then you have a 20% chance of developing it.    2. [place] people are 5 times more likely to develop gout. 3. American blacks, but not African blacks, are more likely to have gout than other populations. 4. Use of alcohol, especially beer, increases the risk for gout.   5. Diets rich in red meats, internal organs, yeast, and oily fish increase the risk for gout.   6. Uric acid levels increase at puberty in men and at menopause in women, so men first develop gout at an earlier age (34s to 60s) than do women (51s to 52s). Gout in pre-menopausal women is distinctly unusual.   7. Attacks of gouty arthritis?can be precipitated when there is a sudden change in uric acid levels.   8. Overindulgence of alcohol and red meats   9. Trauma   10. Starvation and dehydration 11. IV contrast dyes   12. Chemotherapy     Some Possible Causes of Elevated Uric Acid Levels  Medication Diuretics used for weight loss or heart disease, insulin, some antibiotics, medication for rheumatoid arthritis, or an overdose of B vitamins can cause uric acid levels to rise. Diuretics reduce sodium, magnesium, calcium and potassium (among other things) levels. If you need to use a diuretic, see our natural herbal products for ones with fewer side effects. One customer reported getting gout when he took beta-blockers for his high blood pressure.     Poor kidney function When kidneys are not functioning at optimum levels, they lose their ability to excrete uric acid from the body. This situation may be due to various kidney problems or over-consumption of alcohol. When alcohol is metabolized, lactic acid is produced, which hinders uric acid excretion by the kidneys.     Dieting Severe dieting or fasting can cause excess lactic  acid, which hinders uric acid excretion by the kidneys. Crash and severe calorie restriction diets shock your metabolism and can trigger a gout attack. Dieting may also cause a loss of potassium, which can increase urate levels in the blood. As mentioned  above, some dieters also use diuretics to speed the process, and they can rob the body of potassium and other minerals, triggering a gout attack. It seems to be a vicious circle! However, a proper diet that is done slowly is recommended because losing weight will reduce serum levels of uric acid.    Diet Traditional thinking tells [place] that gout is the result of excessive amounts of alcohol, protein, heavy foods, coffee and soft drinks in your diet. Certain foods contain high levels of purine which can cause uric acid levels to rise. Purine is a protein substance that is transformed into uric acid during digestion. Reduction in consumption of these foods is very often successful in reducing or eliminating gout.     A potassium deficiency can increase urate levels in the blood. This is very important, and ways to correct it?are discussed above and under the diuretics section.    Drugs that increase serum uric acid  1. Aspirin (Low dose)  2. Diuretics  3. hypertensive medications   4. Nicotinic acid   5. Cyclosporine A   6. Acetaminophen (Tylenol)  7. Others     Pharmacological treatment:  1) Colchicine (PO)  Adverse Side Effects: nausea, vomiting, diarrhea  MAX: 6 mg/day during an acute attack  Goal: keep serum urate < 7.0 mg/dl Prophylactic Dose: 0.6 -1.2 mg/day  2) Probenecid (Uricosuric properties)  Mechanism of Action: increases uric acid excretion  Adverse Side Effects: overt nephrolithiasis (Kidney stones). Side effects of probenecid are uncommon and usually mild. In addition to causing kidney stones and precipitating acute gouty arthritis, side effects of probenecid include hair loss, skin rash, headache, nausea, sore gums, and fever. In rare instances, it has caused severe anemias  To Avoid Stones:  start at low doses  Stay well hydrated  Alkalinize urine  1) Sodium Bicarbonate  2) And/or Acetazolamide (Carbonic anhydrate inhibitor) Starting Dose: 250 mg bid and increase over  several weeks  3) Allopurinol  Mechanism of Action: Decreases uric acid production  Adverse Side Effects: fever, dermatitis, elevated liver enzymes, diarrhea, and vasculitis. The most frequent adverse reaction to allopurinol is skin rash. Allopurinol should be discontinued immediately at the first appearance of rash, painful urination, blood in the urine, eye irritation, or swelling of the mouth or lips, because these can be a signs of impending severe allergic reaction, which can be fatal. Rarely, allopurinol can cause nerve, kidney, and bone marrow damage.  Dose: 300 mg/day   Treatment  1. Drink 2 to 3 L of fluid daily.  2. Consume a moderate amount of protein. Limit meat, fish and poultry to 4 - 6 oz per day. Try other low-purine good protein foods such as low fat dairy products, tofu and eggs.  3. Limit fat intake by choosing leaner meats, foods prepared with less oils and lower fat dairy products  4. Aside from avoiding high purine foods, maintaining a healthy body weight is important for gout patients as well. Obesity can result in increased uric acid production by the body. Follow a well-balanced diet to lose excess body weight. Do not follow a high-protein low-carb diet as this can worsen gout conditions.  5. Keep the urine pH high (basic or non-acidic)  6. Colchicine, probenecid, allopurinol, sodium bicarbonate.  Prevention  If you are at risk for gout, you should   1. Eat a low-cholesterol, low-fat diet. People with gout have a higher risk for heart disease. This diet would not only lower your risk for gout but also your risk for heart disease.   2. Slowly lose weight. This can lower your uric acid levels. Losing weight too rapidly can occasionally precipitate gout attacks.   3. Restrict your?intake of alcohol, especially beer.   If you have had an attack of gouty arthritis, you should do all of the above and follow the regimen prescribed by your physician. The adequate prevention of  gouty arthritis may involve lifelong medical therapy.    Balanced Diet  According to the American Medical Association, a balanced diet for people with gout include foods:  1. High in complex carbohydrates (whole grains, fruits, vegetables)   2. Low in protein (15% of calories and sources should be soy, lean meats, poultry)   3. No more than 30% of calories from fat (10% animal fat)    Beneficial Foods  Foods which may be beneficial to people with gout include:  1. Dark berries may contain chemicals that lower uric acid and reduce inflammation.   2. Tofu which is made from soybeans may be a better choice than meats.   3. Certain fatty acids found in certain fish such as salmon, flax or olive oil, or nuts may possess some anti-inflammatory benefits.    Recommended Foods to Eat  1. Fresh cherries, strawberries, blueberries, and other red-blue berries   2. Bananas   3. Celery   4. Tomatoes   5. Vegetables including kale, cabbage, parsley, green-leafy vegetables   6. Foods high in bromelain (pineapple)   7. Foods high in vitamin C (red cabbage, red bell peppers, tangerines, mandarins, oranges, potatoes)   8. Drink fruit juices and purified water (8 glasses of water per day)   9. Low-fat dairy products   10. Complex carbohydrates (breads, cereals, pasta, rice, as well as aforementioned vegetables and fruits)   11. Chocolate, cocoa   12. Coffee, tea   13. Carbonated beverages   14. Essential fatty acids (tuna and salmon, flaxseed, nuts, seeds)   15. Tofu, although a legume and made from soybeans, may be a better choice than meat    Foods to Avoid  Diets which are high in purines and high in protein have long been suspected of causing an increased risk of gout .    According to the American Medical Association, purine-containing foods include:  1. Beer, other alcoholic beverages. Limit alcohol consumption to 1 drink 3 times a week.  2. Anchovies, sardines in oil, fish roes, herring,  Mackerel, Scallops, mussels  3. Yeast. (Beer), whole grain breads and cereals, oatmeal  4. Organ meat (liver, kidneys, brains, sweetbreads)   5. Processed meats (hot dogs, lunch meats, etc.),  6. Legumes (dried beans, peas, lima beans)   7. Meat extracts, consomm, broth, bouillon, gravies. (e.g Oxo, Bovril)  8. Mushrooms, spinach, asparagus, cauliflower, mushrooms.  9. Chicken, duck, ham, [place], Game meats   10. Fried foods, roasted nuts, any food cooked in oil (heated oil destroys vitamin E)  11. Rich foods (cakes, sugar products, white flour products)  12. Dried fruits  13. Caffeine  14. Eggs    NOTE: It is important to remember that purines are found in all protein foods. All sources of purines should not be eliminated.    Urine at pH 7.0 is neutral and elimination of  uric acid decreases by approximately 50% at pH 6.5. The pka of uric acid is 5.75.  In urine at pH 5.0, only 15% of uric acid exists in solution. The solubility increases more than 10-fold at pH 7.0 and more than 100-fold at pH 8.0.    Extremely Alkaline Forming Foods - pH 8.5 to 9.0:  Lemons, Watermelon , Agar Agar , Cantaloupe, Cayenne (Capsicum), Dried dates & figs, Kelp, [place], [place] root, Limes, [place], Melons, Papaya, [place], [place] grapes (sweet), Watercress, Seaweed    Moderate Alkaline - pH 7.5 to 8.0  Apples (sweet), Apricots, Alfalfa sprouts Arrowroot, Avocados, Bananas (ripe), Berries, Carrots, Celery, Currants, Dates & figs (fresh), Garlic , Gooseberry, Grapes (less sweet), Grapefruit, Guavas, Herbs (leafy green), Lettuce (leafy green), Nectarine, Peaches (sweet), Pears (less sweet), Peas (fresh sweet), Persimmon, Pumpkin (sweet), Sea salt , Spinach, Apples (sour), Bamboo shoots, Beans (fresh green), Beets, Bell Pepper, Broccoli, Cabbage, Cauliflower, Carob , Daikon, Ginger (fresh), Grapes (sour), Kale, Kohlrabi, Lettuce (pale green), Oranges, Parsnip, Peaches (less sweet), Peas (less sweet), Potatoes & skin,  Pumpkin (less sweet), Raspberry, Sapote, Strawberry, Squash , Sweet corn (fresh), Tamari , Turnip, Sour Dairy    Slightly Alkaline to Neutral pH 7.0  Almonds , Artichokes ([place]), Barley-Malt (sweetener-[place]), [employer] Rice Syrup, [employer], Cherries, Coconut (fresh), Cucumbers, [employer] plant, Honey (raw), Leeks, Miso, Okra, Olives ripe , [place], [place] (home made with brown rice vinegar), Radish, Sea salt , Spices , Taro, Tomatoes (sweet), Vinegar (sweet brown rice), Water Chestnut, Amaranth, Artichoke (globe), Chestnuts (dry roasted), Egg yolks (soft cooked), Goat's milk and whey (raw) , Horseradish, Mayonnaise (home made), Millet, Olive oil, Quinoa, Rhubarb, Sesame seeds (whole) , Soy beans (dry), Sprouted grains , Tempeh, Tofu, Tomatoes (less sweet)    Slightly Acid to Neutral pH 7.0  Barley malt syrup, Barley, Bran, Cashews, Cereals (unrefined with honey-fruit-maple syrup), Cornmeal, Fructose, Honey (pasteurized), Lentils, Macadamias, Maple syrup (unprocessed),Low Fat Milk (homogenized) and most processed dairy products, Molasses organic , Nutmeg, Mustard, Pistachios, Popcorn (plain), Rice or wheat crackers (unrefined), Rye (grain), Rye bread (organic sprouted), Seeds (pumpkin & sunflower), Walnuts Blueberries, [place] nuts, Butter (salted), Cheeses (mild & crumbly) , Crackers (unrefined rye), Dried beans (mung, adzuki, pinto, kidney, garbanzo) , Dry coconut, Egg whites, Goats milk (homogenized), Olives (pickled), Pecans, Plums , Prunes , Butter (fresh unsalted), Cream (fresh & raw), Milk (raw cow's) , Whey (cow's)     ACID FORMING FOODS FATS & OILS  Avocado Oil, Canola Oil, Corn Oil, Hemp Seed Oil, Flax Oil, Lard, Olive Oil, Safflower Oil, Sesame Oil, Sunflower Oil    FRUITS  Cranberries    GRAINS  Rice Cakes, Wheat Cakes, Amaranth, Barley, Buckwheat, Oats (rolled), Quinoa, Rice, Rye, Spelt, [place], Wheat, Hemp Seed Flour    NUTS & BUTTERS  Cashews, [place] Nuts, Peanuts, Processed Peanut  Butter, Pecans, Tahini    ANIMAL PROTEIN  Beef, Carp, Clams, Fish, [place], [place], Mussels, [place], [employer], Rabbit, [place], Shrimp, [place], [place], [place], Therapist, nutritional    PASTA (WHITE)  Noodles, Macaroni, Spaghetti Distilled Vinegar, Wheat Germ    BEANS & LEGUMES  Black Beans, Chick Peas, Green Peas, Kidney Beans, Lentils, [place] Beans, Pinto Beans, Red Beans, Soy Beans, Soy Milk, White Beans, Rice Milk, Almond Milk    DRUGS & CHEMICALS  Aspartame, Chemicals, Drugs (Medicinal), Drugs (Psychedelic), Pesticides, Herbicides    ALCOHOL  Beer, Spirits, Hard Liquor, Wine    ACTIVITIES  Overwork, Anger, Fear, Jealousy, Stress    Moderate Acid - pH 6.0 to 6.5  Cigarette tobacco, Cream of Wheat (  unrefined), Fish, Fruit juices with sugar, Maple syrup (processed), Molasses (sulphured), Pickles (commercial), Breads (refined) of corn, oats, rice & rye, Cereals (refined), corn flakes, Shellfish, Wheat germ, Whole Wheat foods , Wine , Yogurt (sweetened) Bananas (green), Buckwheat, Cheeses (sharp), Corn & rice breads, Egg whole (cooked hard), Ketchup, Mayonnaise, Oats, Pasta (whole grain), Pastry (wholegrain & honey), Peanuts, Potatoes (with no skins), Popcorn (with salt & butter), Rice (basmati), Rice (brown), Soy sauce (commercial), Tapioca, Wheat bread (sprouted organic)    Extremely Acid Forming Foods - pH 5.0 to 5.5  Artificial sweeteners, Beef, Carbonated soft drinks & fizzy drinks , Cigarettes (tailor made), Drugs, Flour (white wheat), Goat, Lamb, Pastries & cakes from white flour, Pork, Sugar (white) , Beer , Brown sugar , Chicken, [place], Chocolate, Coffee , Custard with white sugar, Jams, Jellies, Liquor , Pasta (white), Rabbit, [place], Table salt refined & iodized, Tea black, [place], Wheat bread, White rice, White vinegar (processed).    Research Update:   A recent study published in the [place] [employer] on Apr 23, 2002 revealed that high intake of low-fat dairy products indeed reduces  the risk of gout by 50%. It is unknown why low-fat dairy products offer a protective effect.   Unfortunately, no natural supplements are proven effective to prevent or alleviate onset of acute gout attacks. The most effective treatment for gout attack is medication.   ____________________________________________________________________________________________

## 2019-01-21 NOTE — Progress Notes (Signed)
Pain Management Virtual Encounter Note - Virtual Visit via Telephone Telehealth (real-time audio visits between healthcare provider and patient).   Patient's Phone No. & Preferred Pharmacy:  [phone] (home); [phone] (mobile); (Preferred) [phone] [email or web address]  [name] [name] [address] [place] [address] Phone: [phone] Fax: [phone]    Pre-screening note:  Our staff contacted [name] and offered her an "in person", "face-to-face" appointment versus a telephone encounter. She indicated preferring the telephone encounter, at this time.   Reason for Virtual Visit: COVID-19*  Social distancing based on CDC and AMA recommendations.   I contacted [name] on 01/21/2019 via telephone.      I clearly identified myself as Gaspar Cola, MD. I verified that I was speaking with the correct person using two identifiers (Name: Marie Clarke, and date of birth: 1970-04-29).  Advanced Informed Consent I sought verbal advanced consent from [facility] for virtual visit interactions. I informed [name] of possible security and privacy concerns, risks, and limitations associated with providing "not-in-person" medical evaluation and management services. I also informed [name] of the availability of "in-person" appointments. Finally, I informed her that there would be a charge for the virtual visit and that she could be  personally, fully or partially, financially responsible for it. [name] expressed understanding and agreed to proceed.   Historic Elements   [name] is a 48 y.o. year old, female patient evaluated today after her last encounter by our practice on 12/19/2018. [name]  has a past medical history of Anemia, Anxiety, Asthma, Bipolar disorder ([place]), CAD (coronary artery disease) ([place]), CHF (congestive heart failure) ([place]), COPD (chronic obstructive pulmonary disease) ([place]), Depression ([place]), Diabetes  mellitus without complication ([place]), Diabetes mellitus, type II ([place]), Drug overdose, GERD (gastroesophageal reflux disease), Headache, Hyperlipidemia, Hypertension, Left leg pain (04/29/2014), MI (myocardial infarction) ([place]), Muscle ache (09/16/2014), Osteoporosis, Overactive bladder, Pancreatitis ([place]), Reflex sympathetic dystrophy, Renal insufficiency, Sleep apnea, Sleep apnea, Stroke ([place]), Thyroid disease, TIA (transient ischemic attack) ([place]), and TIA (transient ischemic attack). She also  has a past surgical history that includes prolapse rectum surgery (N/A, July 2016); Tonsillectomy; Abdominal hysterectomy; and Hernia repair (07/15/2017). [name] has a current medication list which includes the following prescription(s): amoxicillin-clavulanate, aspirin, atorvastatin, b-complex with vitamin c, buspirone, vitamin d3, colchicine, dicyclomine, diphenoxylate-atropine, dulaglutide, furosemide, hydrocodone-acetaminophen, hydroxyzine, ipratropium, klor-con m20, accu-chek safe-t pro, levocetirizine, levothyroxine, levothyroxine, montelukast, myrbetriq, nitroglycerin, olopatadine hcl, ondansetron, oseltamivir, oxycodone, prochlorperazine, quetiapine, solifenacin, trelegy ellipta, ventolin hfa, vitamin e, celecoxib, magnesium oxide, methocarbamol, omeprazole, pregabalin, rizatriptan, torsemide, and tramadol. She  reports that she quit smoking about 4 months ago. Her smoking use included cigarettes. She smoked 0.50 packs per day. She has never used smokeless tobacco. She reports that she does not drink alcohol or use drugs. [name] is allergic to azithromycin; diazepam; levofloxacin; lisinopril; metronidazole; sulfa antibiotics; valproic acid; ziprasidone hcl; divalproex sodium; cephalexin; ciprofloxacin; and penicillins.   HPI  Today, she is being contacted for medication management. The patient indicates doing well with the current medication regimen. No adverse reactions or side effects reported to the  medications.  The patient indicates recently having been hospitalized for her hernia repair.  She also indicates that while she was in the hospital, she was given Celebrex instead of the Mobic and that seemed to work better.  I reviewed her most recent kidney function and she seems to have adequate glomerular filtration rates.  I reviewed her chart and I told her that she  needed to discontinue the Mobic and throw away the pills and that she needed to do the same with the naproxen.  I have provided her with a prescription for Celebrex 100 mcg to be taken 1 twice daily.  For now she is recovering from her recent abdominal surgery and we will plan on doing any type of injections for the time being.  Today we also talked about her gout and I have provided her with information regarding it including some guidance in terms of her diet.  Pharmacotherapy Assessment  Analgesic: Tramadol 50 mg, 1-2 tab q 6 hrs (200-400 mg/day of tramadol) High-Risk due to history of a prior suicidal attempt w/ medications. MME/day:20-40mg /day.   Monitoring: Pharmacotherapy: No side-effects or adverse reactions reported. [place] PMP: PDMP reviewed during this encounter.       Compliance: No problems identified. Effectiveness: Clinically acceptable. Plan: Refer to "POC".  UDS:  Summary  Date Value Ref Range Status  01/22/2018 FINAL  Final    Comment:    ==================================================================== TOXASSURE SELECT 13 (MW) ==================================================================== Test                             Result       Flag       Units Drug Present and Declared for Prescription Verification   Tramadol                       2742         EXPECTED   ng/mg creat   O-Desmethyltramadol            5630         EXPECTED   ng/mg creat   N-Desmethyltramadol            2591         EXPECTED   ng/mg creat    Source of tramadol is a prescription medication.    O-desmethyltramadol and  N-desmethyltramadol are expected    metabolites of tramadol. ==================================================================== Test                      Result    Flag   Units      Ref Range   Creatinine              66               mg/dL      >=20 ==================================================================== Declared Medications:  The flagging and interpretation on this report are based on the  following declared medications.  Unexpected results may arise from  inaccuracies in the declared medications.  **Note: The testing scope of this panel includes these medications:  Tramadol  **Note: The testing scope of this panel does not include following  reported medications:  Acetaminophen  Aspirin (Aspirin 81)  Atorvastatin  Atropine (Lomotil)  Azelastine  Bumetanide  Buspirone  Diphenoxylate (Lomotil)  Fluticasone (Trelegy Ellipta)  Gabapentin  Hydroxyzine (Vistaril)  Lamotrigine  Levocetirizine (Xyzal)  Levothyroxine  Magnesium Oxide  Metformin  Methocarbamol  Mirabegron (Myrbetriq)  Montelukast  Nicotine  Omega-3 Fatty Acids  Ondansetron  Potassium  Potassium (Klor-Con)  Promethazine  Quetiapine  Rizatriptan  Umeclidinium (Trelegy Ellipta)  Vilanterol (Trelegy Ellipta) ==================================================================== For clinical consultation, please call [phone]. ====================================================================    Laboratory Chemistry Profile (12 mo)  Renal: 01/11/2019: BUN 18; Creatinine, Ser 0.68  Lab Results  Component Value Date   GFRAA >60 01/11/2019  GFRNONAA >60 01/11/2019   Hepatic: 01/11/2019: Albumin 3.4 Lab Results  Component Value Date   AST 17 01/11/2019   ALT 16 01/11/2019   Other: 11/17/2018: Vitamin B-12 366 Note: Above Lab results reviewed.  Imaging  CT ABDOMEN PELVIS W CONTRAST CLINICAL DATA:  Abdominal pain, status post hernia repair 01/02/2019  EXAM: CT ABDOMEN AND PELVIS  WITH CONTRAST  TECHNIQUE: Multidetector CT imaging of the abdomen and pelvis was performed using the standard protocol following bolus administration of intravenous contrast.  CONTRAST:  164mL OMNIPAQUE IOHEXOL 300 MG/ML  SOLN  COMPARISON:  None.  FINDINGS: Lower chest: The visualized heart size within normal limits. No pericardial fluid/thickening.  No hiatal hernia.  The visualized portions of the lungs are clear.  Hepatobiliary: The liver is normal in density without focal abnormality.The main portal vein is patent. No evidence of calcified gallstones, gallbladder wall thickening or biliary dilatation.  Pancreas: Unremarkable. No pancreatic ductal dilatation or surrounding inflammatory changes.  Spleen: Normal in size without focal abnormality.  Adrenals/Urinary Tract: Both adrenal glands appear normal. The kidneys and collecting system appear normal without evidence of urinary tract calculus or hydronephrosis. Bladder is unremarkable.  Stomach/Bowel: The stomach, small bowel, and colon are normal in appearance. No inflammatory changes, wall thickening, or obstructive findings.The appendix is normal.  Vascular/Lymphatic: There are no enlarged mesenteric, retroperitoneal, or pelvic lymph nodes. No significant vascular findings are present.  Reproductive: The patient is status post hysterectomy. No adnexal masses or collections seen.  Other: Along the anterior abdominal wall there is overlying surgical staples and significant fat stranding changes. A pigtail catheter seen within the subcutaneous fat. Along the lower anterior abdominal wall adjacent to the significant fat stranding changes, there is a loculated intraperitoneal fluid collection extending from the deep fascial layer measuring approximately 9.0 x 3.8 by 14.1 cm and tracking into the anterior lower the pelvis. There is a small amount of subcutaneous emphysema and pneumoperitoneum best seen on series 2,  image 49 in the mid anterior abdomen.  Musculoskeletal: No acute or significant osseous findings.  IMPRESSION: 1. Postsurgical changes along the anterior lower abdominal wall with a pigtail catheter in the subcutaneous fat. 2. Beneath the lower anterior abdominal wall fascia there is a multilocular intra-abdominal abscess/seroma extending into the deep pelvis measuring 9.0 x 3.8 x 14.1 cm. 3. Small amount of subcutaneous emphysema and pneumoperitoneum along the upper anterior abdomen, likely postsurgical.  Electronically Signed   By: Prudencio Pair M.D.   On: 01/12/2019 03:24   Assessment  The primary encounter diagnosis was Chronic pain syndrome. Diagnoses of CRPS (complex regional pain syndrome) type 1 of lower limb (left ankle), Occipital headache (Bilateral), Chronic musculoskeletal pain, Neurogenic pain, Neuropathic pain, Long term current use of non-steroidal anti-inflammatories (NSAID), NSAID induced gastritis, Gastroesophageal reflux disease without esophagitis, Osteoarthritis involving multiple joints, and Chronic gout without tophus, unspecified cause, unspecified site were also pertinent to this visit.  Plan of Care  Problem-specific:  No problem-specific Assessment & Plan notes found for this encounter.  I have discontinued Stanton Kidney Rohde's naproxen, meloxicam, terconazole, promethazine, polyethylene glycol-electrolytes, nystatin, metFORMIN, lisinopril, fluconazole, doxycycline, doxycycline, and Colcrys. I am also having her start on celecoxib. Additionally, I am having her maintain her atorvastatin, levothyroxine, montelukast, Trelegy Ellipta, Myrbetriq, Ventolin HFA, levocetirizine, nitroGLYCERIN, Dulaglutide, levothyroxine, ondansetron, Klor-Con M20, torsemide, QUEtiapine, vitamin E, accu-chek safe-t pro, diphenoxylate-atropine, furosemide, busPIRone, oxyCODONE, solifenacin, prochlorperazine, oseltamivir, Olopatadine HCl, ipratropium, HYDROcodone-acetaminophen, dicyclomine,  colchicine, Vitamin D3, B-complex with vitamin C, aspirin, amoxicillin-clavulanate, hydrOXYzine, methocarbamol, Magnesium Oxide, traMADol, pregabalin,  omeprazole, and rizatriptan.  Pharmacotherapy (Medications Ordered): Meds ordered this encounter  Medications  . methocarbamol (ROBAXIN) 750 MG tablet    Sig: Take 1 tablet (750 mg total) by mouth every 8 (eight) hours as needed for muscle spasms.    Dispense:  90 tablet    Refill:  5    Fill one day early if pharmacy is closed on scheduled refill date. May substitute for generic if available.  . Magnesium Oxide 500 MG CAPS    Sig: Take 1 capsule (500 mg total) by mouth 2 (two) times daily at 8 am and 10 pm.    Dispense:  60 capsule    Refill:  11    Fill one day early if pharmacy is closed on scheduled refill date. May substitute for generic if available.  . traMADol (ULTRAM) 50 MG tablet    Sig: Take 1-2 tablets (50-100 mg total) by mouth every 6 (six) hours.    Dispense:  240 tablet    Refill:  5    Chronic Pain: STOP Act (Not applicable) Fill 1 day early if closed on refill date. Do not fill until: 04/13/2019. To last until: 10/10/2019. Avoid benzodiazepines within 8 hours of opioids  . pregabalin (LYRICA) 150 MG capsule    Sig: Take 1 capsule (150 mg total) by mouth 2 (two) times daily.    Dispense:  60 capsule    Refill:  5    Fill one day early if pharmacy is closed on scheduled refill date. May substitute for generic if available.  Marland Kitchen omeprazole (PRILOSEC) 20 MG capsule    Sig: Take 1 capsule (20 mg total) by mouth at bedtime.    Dispense:  30 capsule    Refill:  11    Fill one day early if pharmacy is closed on scheduled refill date. May substitute for generic if available.  . rizatriptan (MAXALT) 10 MG tablet    Sig: Take 1 tablet (10 mg total) by mouth as needed.    Dispense:  10 tablet    Refill:  5    Fill one day early if pharmacy is closed on scheduled refill date. May substitute for generic if available.  . celecoxib  (CELEBREX) 100 MG capsule    Sig: Take 1 capsule (100 mg total) by mouth 2 (two) times daily.    Dispense:  60 capsule    Refill:  5    Fill one day early if pharmacy is closed on scheduled refill date. May substitute for generic if available.   Orders:  No orders of the defined types were placed in this encounter.  Follow-up plan:   Return in about 33 weeks (around 09/09/2019) for (VV), (MM).      Interventional management options:  Considering:   NOTE: The patient is medical psychology evaluation indicates that the patient is a "suitable candidate" for a spinal cord stimulator trial. Possible lumbar spinal cord stimulator trial Diagnosticbilateral lumbar facet nerve block Possible bilateral lumbar facetRFA Diagnostic left-sided L5-S1 transforaminal ESI Diagnostic left-sided L5 selective nerve root block Possible left-sided L5 DRG RFA Diagnostic left-sidedlumbarsympathetic nerve block Possible left-sided lumbar sympathetic RFA Diagnostic bilateral cervical facet nerve block Possible bilateral cervical facetRFA Diagnosticleft intra-articular shoulder injection Diagnosticleft suprascapular nerve block Diagnostic left suprascapularRFA   Palliative PRN treatment(s):   Diagnostic left L5 TFESI #2 + right L4-5 LESI #2  Palliative bilateral lumbar facet blocks Palliative right-sided lumbar facet RFA #2 (last done 04/22/2018) Palliative left-sided lumbar facet RFA #2 (last done 03/13/2018)  Recent Visits Date Type Provider Dept  12/08/18 Office Visit Milinda Pointer, MD [facility]  11/17/18 Office Visit Milinda Pointer, MD [facility]  Showing recent visits within past 90 days and meeting all other requirements   Today's Visits Date Type Provider Dept  01/21/19 Telemedicine Milinda Pointer, MD [facility]  Showing today's visits and meeting all other requirements   Future Appointments Date Type Provider Dept   04/13/19 Appointment Milinda Pointer, MD [facility]  Showing future appointments within next 90 days and meeting all other requirements   I discussed the assessment and treatment plan with the patient. The patient was provided an opportunity to ask questions and all were answered. The patient agreed with the plan and demonstrated an understanding of the instructions.  Patient advised to call back or seek an in-person evaluation if the symptoms or condition worsens.  Total duration of non-face-to-face encounter: 15 minutes.  Note by: Gaspar Cola, MD Date: 01/21/2019; Time: 2:25 PM  Note: This dictation was prepared with Dragon dictation. Any transcriptional errors that may result from this process are unintentional.  Disclaimer:  * Given the special circumstances of the COVID-19 pandemic, the federal government has announced that the Office for Civil Rights (OCR) will exercise its enforcement discretion and will not impose penalties on physicians using telehealth in the event of noncompliance with regulatory requirements under the [place] (HIPAA) in connection with the good faith provision of telehealth during the XX123456 national public health emergency. ([place])

## 2019-02-07 ENCOUNTER — Other Ambulatory Visit: Payer: Self-pay

## 2019-02-07 ENCOUNTER — Encounter: Payer: Self-pay | Admitting: Emergency Medicine

## 2019-02-07 ENCOUNTER — Emergency Department: Payer: Medicare HMO

## 2019-02-07 DIAGNOSIS — Z79899 Other long term (current) drug therapy: Secondary | ICD-10-CM | POA: Insufficient documentation

## 2019-02-07 DIAGNOSIS — E1122 Type 2 diabetes mellitus with diabetic chronic kidney disease: Secondary | ICD-10-CM | POA: Diagnosis not present

## 2019-02-07 DIAGNOSIS — I252 Old myocardial infarction: Secondary | ICD-10-CM | POA: Diagnosis not present

## 2019-02-07 DIAGNOSIS — I509 Heart failure, unspecified: Secondary | ICD-10-CM | POA: Insufficient documentation

## 2019-02-07 DIAGNOSIS — E039 Hypothyroidism, unspecified: Secondary | ICD-10-CM | POA: Insufficient documentation

## 2019-02-07 DIAGNOSIS — Z87891 Personal history of nicotine dependence: Secondary | ICD-10-CM | POA: Insufficient documentation

## 2019-02-07 DIAGNOSIS — G8918 Other acute postprocedural pain: Secondary | ICD-10-CM | POA: Insufficient documentation

## 2019-02-07 DIAGNOSIS — L7634 Postprocedural seroma of skin and subcutaneous tissue following other procedure: Secondary | ICD-10-CM | POA: Diagnosis not present

## 2019-02-07 DIAGNOSIS — I259 Chronic ischemic heart disease, unspecified: Secondary | ICD-10-CM | POA: Diagnosis not present

## 2019-02-07 DIAGNOSIS — J449 Chronic obstructive pulmonary disease, unspecified: Secondary | ICD-10-CM | POA: Insufficient documentation

## 2019-02-07 DIAGNOSIS — N183 Chronic kidney disease, stage 3 unspecified: Secondary | ICD-10-CM | POA: Diagnosis not present

## 2019-02-07 DIAGNOSIS — R1030 Lower abdominal pain, unspecified: Secondary | ICD-10-CM | POA: Diagnosis present

## 2019-02-07 DIAGNOSIS — J45909 Unspecified asthma, uncomplicated: Secondary | ICD-10-CM | POA: Insufficient documentation

## 2019-02-07 DIAGNOSIS — I130 Hypertensive heart and chronic kidney disease with heart failure and stage 1 through stage 4 chronic kidney disease, or unspecified chronic kidney disease: Secondary | ICD-10-CM | POA: Diagnosis not present

## 2019-02-07 DIAGNOSIS — Z7982 Long term (current) use of aspirin: Secondary | ICD-10-CM | POA: Diagnosis not present

## 2019-02-07 LAB — COMPREHENSIVE METABOLIC PANEL
ALT: 18 U/L (ref 0–44)
AST: 22 U/L (ref 15–41)
Albumin: 4.5 g/dL (ref 3.5–5.0)
Alkaline Phosphatase: 107 U/L (ref 38–126)
Anion gap: 13 (ref 5–15)
BUN: 26 mg/dL — ABNORMAL HIGH (ref 6–20)
CO2: 28 mmol/L (ref 22–32)
Calcium: 9 mg/dL (ref 8.9–10.3)
Chloride: 97 mmol/L — ABNORMAL LOW (ref 98–111)
Creatinine, Ser: 1.27 mg/dL — ABNORMAL HIGH (ref 0.44–1.00)
GFR calc Af Amer: 58 mL/min — ABNORMAL LOW (ref >60)
GFR calc non Af Amer: 50 mL/min — ABNORMAL LOW (ref >60)
Glucose, Bld: 304 mg/dL — ABNORMAL HIGH (ref 70–99)
Potassium: 3.8 mmol/L (ref 3.5–5.1)
Sodium: 138 mmol/L (ref 135–145)
Total Bilirubin: 0.4 mg/dL (ref 0.3–1.2)
Total Protein: 8.2 g/dL — ABNORMAL HIGH (ref 6.5–8.1)

## 2019-02-07 LAB — CBC
HCT: 34.6 % — ABNORMAL LOW (ref 36.0–46.0)
Hemoglobin: 11.6 g/dL — ABNORMAL LOW (ref 12.0–15.0)
MCH: 29.1 pg (ref 26.0–34.0)
MCHC: 33.5 g/dL (ref 30.0–36.0)
MCV: 86.7 fL (ref 80.0–100.0)
Platelets: 304 10*3/uL (ref 150–400)
RBC: 3.99 MIL/uL (ref 3.87–5.11)
RDW: 14.5 % (ref 11.5–15.5)
WBC: 10 10*3/uL (ref 4.0–10.5)
nRBC: 0 % (ref 0.0–0.2)

## 2019-02-07 MED ORDER — ONDANSETRON HCL 4 MG/2ML IJ SOLN
4.0000 mg | Freq: Once | INTRAMUSCULAR | Status: AC
  Administered 2019-02-07: 4 mg via INTRAVENOUS
  Filled 2019-02-07: qty 2

## 2019-02-07 MED ORDER — IOHEXOL 300 MG/ML  SOLN
125.0000 mL | Freq: Once | INTRAMUSCULAR | Status: AC | PRN
  Administered 2019-02-07: 125 mL via INTRAVENOUS

## 2019-02-07 MED ORDER — FENTANYL CITRATE (PF) 100 MCG/2ML IJ SOLN
50.0000 ug | INTRAMUSCULAR | Status: DC | PRN
  Administered 2019-02-07: 50 ug via INTRAVENOUS
  Filled 2019-02-07: qty 2

## 2019-02-07 NOTE — ED Triage Notes (Signed)
Bandage visualized, sero sanguinous light pink drainage noted, light sero sanguinous drainage in JP drain, approx 30 cc noted.

## 2019-02-07 NOTE — ED Triage Notes (Addendum)
Patient states that she had a hernia surgery on 01/02/19. Patient states that she developed an abscess the weekend after Thanksgiving and they had to place a JP drain. Patient states that the drainage was clear until today and today that she noticed some blood in the drain. Patient states that she noticed increase pressure in her abdomen 2 days ago and today the pressure improved when she noticed the blood in the JP drain.

## 2019-02-08 ENCOUNTER — Emergency Department
Admission: EM | Admit: 2019-02-08 | Discharge: 2019-02-08 | Disposition: A | Discharge status: Home / Self Care | Payer: Medicare HMO | Attending: Emergency Medicine | Admitting: Emergency Medicine

## 2019-02-08 DIAGNOSIS — S301XXA Contusion of abdominal wall, initial encounter: Secondary | ICD-10-CM

## 2019-02-08 DIAGNOSIS — G8918 Other acute postprocedural pain: Secondary | ICD-10-CM

## 2019-02-08 MED ORDER — CLINDAMYCIN HCL 150 MG PO CAPS: 450.0000 mg | ORAL_CAPSULE | Freq: Three times a day (TID) | ORAL | 0 refills | Status: AC

## 2019-02-08 MED ORDER — CLINDAMYCIN HCL 150 MG PO CAPS
450.0000 mg | ORAL_CAPSULE | Freq: Once | ORAL | Status: AC
  Administered 2019-02-08: 450 mg via ORAL
  Filled 2019-02-08: qty 3

## 2019-02-08 NOTE — Discharge Instructions (Signed)
As we discussed, your work-up was generally reassuring, and the JP drain seems to be functioning correctly.  The abdominal abscess/fluid collection that you had previously has almost completely resolved.  Continue to use the JP drain as instructed.  There is an area within the tissue of your abdominal wall that likely has another fluid collection.  This is different than the one you had previously, and I spoke with the surgeon, Dr. Trellis Moment, at [facility].  We agreed upon a plan to start you on antibiotics to try to prevent or treat infection and they will try to see you in the surgery clinic on Monday or Tuesday at the latest.  I recommend that you call on Monday morning and keep calling until you get in touch with someone so that you can have a follow-up appointment at the next available opportunity.  I provided a number for a general surgery clinic but if you have a better number on the main [place], I recommend you use that number.  Please let them know that Dr. Trellis Moment said that you need to be seen at the next available opportunity this week.  If you develop any new or worsening symptoms that concern you, please follow-up with your surgeon or at [facility] emergency department if possible.  If time is critical, please call 911 or come to the nearest available emergency department.

## 2019-02-08 NOTE — ED Provider Notes (Signed)
[facility] Emergency Department Provider Note  ____________________________________________   First MD Initiated Contact with Patient 02/08/19 [phone]     (approximate)  I have reviewed the triage vital signs and the nursing notes.   HISTORY  Chief Complaint Post-op Problem    HPI Marie Clarke is a 48 y.o. female with a complicated medical history who presents with concerns of postoperative complication after having surgery at [facility] by Dr. Sharen Counter a little over a month ago for recurrent ventral hernia.  She subsequently developed an abscess with JP drain placement.  She said that the drainage was clear until today when she developed some blood in the drain which is happened multiple times.  She said that she has emptied the bulb twice and the material has been bloody although it is currently stopped draining.  She also feels some increased pressure in her lower abdomen over the last couple of days but the pressure has improved after the drainage today.  She called [facility] who recommended she come to the emergency department so she came to [facility] because it is closer.  No fever, chest pain, shortness of breath, nor vomiting.  The onset of the bloody drainage was acute today, symptoms are moderate.  Nothing in particular makes them better or worse.   She has persistent pain in the area but it is no worse than usual.        Past Medical History:  Diagnosis Date  . Anemia   . Anxiety   . Asthma   . Bipolar disorder ([place])   . CAD (coronary artery disease) unk  . CHF (congestive heart failure) ([place])   . COPD (chronic obstructive pulmonary disease) ([place])   . Depression unk  . Diabetes mellitus without complication ([place])   . Diabetes mellitus, type II ([place])   . Drug overdose   . GERD (gastroesophageal reflux disease)   . Headache   . Hyperlipidemia   . Hypertension   . Left leg pain 04/29/2014  . MI (myocardial infarction) ([place])   . Muscle ache 09/16/2014  . Osteoporosis    . Overactive bladder   . Pancreatitis unk  . Reflex sympathetic dystrophy   . Renal insufficiency   . Sleep apnea    pt reported on 2/6/7 she currently is not using CPAP  . Sleep apnea   . Stroke ([place])   . Thyroid disease   . TIA (transient ischemic attack) unk  . TIA (transient ischemic attack)     Patient Active Problem List   Diagnosis Date Noted  . Gout 01/21/2019  . Noncompliance with treatment regimen 12/29/2018  . BMI 40.0-44.9, adult ([place]) 11/19/2018  . AKI (acute kidney injury) ([place]) 11/17/2018  . Postural dizziness with presyncope 09/29/2018  . Bipolar I disorder, most recent episode mixed ([place]) 09/10/2018  . Panic attacks 09/10/2018  . Chronic lumbosacral L5-S1 IVD protrusion (Bilateral) 08/25/2018  . Lumbar lateral recess stenosis (L5-S1) (Bilateral) 08/25/2018  . Wound disruption 08/05/2018  . Osteoarthritis involving multiple joints 07/10/2018  . Long term current use of non-steroidal anti-inflammatories (NSAID) 07/10/2018  . NSAID induced gastritis 07/10/2018  . DDD (degenerative disc disease), lumbosacral 04/22/2018  . Disease related peripheral neuropathy 11/13/2017  . Gastroesophageal reflux disease without esophagitis 11/13/2017  . Occipital headache (Bilateral) 11/13/2017  . Cervicogenic headache (Bilateral) (L>R) 11/13/2017  . History of postoperative nausea 10/22/2017  . Chronic shoulder pain (Fifth Area of Pain) (Bilateral) (L>R) 10/09/2017  . CRPS (complex regional pain syndrome) type 1 of lower limb (  left ankle) 10/09/2017  . Ankle joint instability (Left) 10/09/2017  . Ankle sprain, sequela (Left) 10/09/2017  . History of psychiatric symptoms 10/09/2017  . Chronic pain syndrome 10/02/2017  . Spondylosis without myelopathy or radiculopathy, lumbosacral region 10/02/2017  . Chronic musculoskeletal pain 10/02/2017  . Elevated C-reactive protein (CRP) 09/10/2017  . Elevated sed rate 09/10/2017  . Chronic neck pain (Fourth Area of Pain) (Bilateral)  (L>R) 09/09/2017  . Pharmacologic therapy 09/09/2017  . Disorder of skeletal system 09/09/2017  . Problems influencing health status 09/09/2017  . Long term current use of opiate analgesic 09/09/2017  . Tobacco use disorder 07/16/2017  . Ventral hernia without obstruction or gangrene 06/25/2017  . Strain of extensor muscle, fascia and tendon of left index finger at wrist and hand level, initial encounter 05/16/2017  . Sepsis ([place]) 04/14/2017  . Osteopenia 04/03/2017  . Hypotension 09/17/2016  . Contusion of knee (Left) 10/12/2015  . Strain of knee (Left) 10/12/2015  . Incidental lung nodule 04/28/2015  . Chronic low back pain (Primary Area of Pain) (Bilateral) (L>R) 03/28/2015  . Chronic lower extremity pain (Referred) (Secondary Area of Pain) (Left) 03/28/2015  . Abdominal wound dehiscence 03/28/2015  . Encounter for pain management planning 03/28/2015  . Morbid obesity ([place]) 03/28/2015  . Abnormal CT scan, lumbar spine 03/28/2015  . Lumbar facet hypertrophy 03/28/2015  . Lumbar facet syndrome (Bilateral) (L>R) 03/28/2015  . Lumbar foraminal stenosis (Bilateral) (L5-S1) 03/28/2015  . Chronic ankle pain [facility] Area of Pain) (Left) 03/28/2015  . Neurogenic pain 03/28/2015  . Neuropathic pain 03/28/2015  . Myofascial pain 03/28/2015  . History of suicide attempt 03/28/2015  . PTSD (post-traumatic stress disorder) 01/13/2015  . Abnormal gait 12/15/2014  . Congestive heart failure ([place]) 11/15/2014  . Abdominal wall abscess 09/20/2014  . 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 Diabetes mellitus, type 2 ([place]) 08/13/2014  . Bipolar affective disorder ([place]) 08/13/2014  . Type 2 diabetes mellitus ([place]) 08/13/2014  . Rectal prolapse 08/09/2014  . Rectal bleeding 08/09/2014  . Rectal bleed 08/09/2014  . Affective bipolar disorder ([place]) 08/05/2014  . Arteriosclerosis of coronary artery 08/05/2014  . CCF (congestive cardiac failure) ([place]) 08/05/2014  . Chronic kidney disease 08/05/2014  .  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 Apnea, sleep 08/05/2014  . Temporary cerebral vascular dysfunction 08/05/2014  . Polypharmacy 04/29/2014  . Other long term (current) drug therapy 04/29/2014  . 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 Chronic kidney disease, stage III (moderate) ([place]) 12/14/2013  . Controlled diabetes mellitus type II without complication ([place]) 99991111  . Essential (primary) hypertension 12/03/2013  . Adult hypothyroidism 12/03/2013  . Controlled type 2 diabetes mellitus without complication ([place]) 99991111    Past Surgical History:  Procedure Laterality Date  . ABDOMINAL HYSTERECTOMY    . HERNIA REPAIR  07/15/2017   [facility]  . prolapse rectum surgery N/A July 2016  . TONSILLECTOMY      Prior to Admission medications   Medication Sig Start Date End Date Taking? Authorizing Provider  aspirin 81 MG chewable tablet Chew by mouth.    [provider]  atorvastatin (LIPITOR) 40 MG tablet Take 40 mg by mouth at bedtime.     [provider]  B Complex-C (B-COMPLEX WITH VITAMIN C) tablet Take by mouth.    [provider]  busPIRone (BUSPAR) 30 MG tablet Take 1 tablet (30 mg total) by mouth 2 (two) times daily. 12/11/18   Ursula Alert, MD  celecoxib (CELEBREX) 100 MG capsule Take 1 capsule (100 mg total) by  mouth 2 (two) times daily. 01/21/19 07/20/19  Milinda Pointer, MD  Cholecalciferol (VITAMIN D3) 125 MCG (5000 UT) TABS Take by mouth.    [provider]  clindamycin (CLEOCIN) 150 MG capsule Take 3 capsules (450 mg total) by mouth 3 (three) times daily for 7 days. 02/08/19 02/15/19  Hinda Kehr, MD  colchicine (COLCRYS) 0.6 MG tablet Colcrys 0.6 mg tablet    [provider]  dicyclomine (BENTYL) 10 MG capsule dicyclomine 10 mg capsule    [provider]  diphenoxylate-atropine (LOMOTIL) 2.5-0.025 MG tablet Take by mouth. 12/25/17   [provider]  Dulaglutide 0.75 MG/0.5ML SOPN Inject 0.75 mg into the  skin every Saturday.  07/15/18   [provider]  furosemide (LASIX) 20 MG tablet Take 20 mg by mouth 2 (two) times daily.    [provider]  HYDROcodone-acetaminophen (NORCO/VICODIN) 5-325 MG tablet hydrocodone 5 mg-acetaminophen 325 mg tablet    [provider]  hydrOXYzine (ATARAX/VISTARIL) 25 MG tablet Take 1 tablet (25 mg total) by mouth every 6 (six) hours as needed. 01/20/19   Ursula Alert, MD  ipratropium (ATROVENT) 0.03 % nasal spray  01/15/19   [provider]  KLOR-CON M20 20 MEQ tablet Take 0.5 tablets (10 mEq total) by mouth 2 (two) times daily. Patient taking differently: Take 20 mEq by mouth daily. But can take twice a day if needed 11/18/18   Mayo, Pete Pelt, MD  Lancets (ACCU-CHEK SAFE-T PRO) lancets  11/21/18   [provider]  levocetirizine (XYZAL) 5 MG tablet Take 5 mg by mouth daily.  06/04/18   [provider]  levothyroxine (SYNTHROID) 50 MCG tablet Take 50 mcg by mouth daily. (237mcg total daily) 09/03/18   [provider]  levothyroxine (SYNTHROID, LEVOTHROID) 200 MCG tablet Take 200 mcg by mouth daily. (26mcg total daily)    [provider]  Magnesium Oxide 500 MG CAPS Take 1 capsule (500 mg total) by mouth 2 (two) times daily at 8 am and 10 pm. 04/13/19 04/12/20  Milinda Pointer, MD  methocarbamol (ROBAXIN) 750 MG tablet Take 1 tablet (750 mg total) by mouth every 8 (eight) hours as needed for muscle spasms. 04/13/19 10/10/19  Milinda Pointer, MD  montelukast (SINGULAIR) 10 MG tablet Take 10 mg by mouth daily.     [provider]  MYRBETRIQ 50 MG TB24 tablet Take 50 mg by mouth daily.     [provider]  nitroGLYCERIN (NITROSTAT) 0.4 MG SL tablet Place 0.4 mg under the tongue every 5 (five) minutes as needed for chest pain.     [provider]  Olopatadine HCl 0.2 % SOLN  01/01/19   [provider]  omeprazole (PRILOSEC) 20 MG capsule Take 1 capsule (20 mg total) by  mouth at bedtime. 04/13/19 04/12/20  Milinda Pointer, MD  ondansetron (ZOFRAN-ODT) 8 MG disintegrating tablet Take 8 mg by mouth every 8 (eight) hours as needed for nausea or vomiting.    [provider]  oseltamivir (TAMIFLU) 75 MG capsule oseltamivir 75 mg capsule    [provider]  oxyCODONE (OXY IR/ROXICODONE) 5 MG immediate release tablet  01/15/19   [provider]  pregabalin (LYRICA) 150 MG capsule Take 1 capsule (150 mg total) by mouth 2 (two) times daily. 04/13/19 10/10/19  Milinda Pointer, MD  prochlorperazine (COMPAZINE) 10 MG tablet prochlorperazine maleate 10 mg tablet    [provider]  QUEtiapine (SEROQUEL) 100 MG tablet Take 1-1.5 tablets (100-150 mg total) by mouth at bedtime as  needed (sleep). 11/28/18   Ursula Alert, MD  rizatriptan (MAXALT) 10 MG tablet Take 1 tablet (10 mg total) by mouth as needed. 01/21/19 07/20/19  Milinda Pointer, MD  solifenacin (VESICARE) 5 MG tablet Take by mouth. 01/16/19 01/16/20  [provider]  torsemide (DEMADEX) 20 MG tablet Take 1 tablet (20 mg total) by mouth 2 (two) times daily. 11/18/18 12/18/18  Mayo, Pete Pelt, MD  traMADol (ULTRAM) 50 MG tablet Take 1-2 tablets (50-100 mg total) by mouth every 6 (six) hours. 04/13/19 10/10/19  Milinda Pointer, MD  TRELEGY ELLIPTA 100-62.5-25 MCG/INH AEPB Inhale 1 puff into the lungs daily.     [provider]  VENTOLIN HFA 108 (90 Base) MCG/ACT inhaler Inhale 1-2 puffs into the lungs every 6 (six) hours as needed for wheezing or shortness of breath.  05/03/18   [provider]  vitamin E 400 UNIT capsule vitamin e 400 iu synthetic    [provider]    Allergies Azithromycin, Diazepam, Levofloxacin, Lisinopril, Metronidazole, Sulfa antibiotics, Valproic acid, Ziprasidone hcl, Divalproex sodium, Cephalexin, Ciprofloxacin, and Penicillins  Family History  Problem Relation Age of Onset  . Diabetes Mellitus II Mother   . CAD Mother   .  Sleep apnea Mother   . Osteoarthritis Mother   . Osteoporosis Mother   . Anxiety disorder Mother   . Depression Mother   . Bipolar disorder Mother   . Bipolar disorder Father   . Hypertension Father   . Depression Father   . Anxiety disorder Father   . Post-traumatic stress disorder Sister     Social History Social History   Tobacco Use  . Smoking status: Former Smoker    Packs/day: 0.50    Types: Cigarettes    Quit date: 09/01/2018    Years since quitting: 0.4  . Smokeless tobacco: Never Used  . Tobacco comment: Patient quit smoking 09/01/2018  Substance Use Topics  . Alcohol use: No    Alcohol/week: 0.0 standard drinks  . Drug use: No    Review of Systems Constitutional: No fever/chills Eyes: No visual changes. ENT: No sore throat. Cardiovascular: Denies chest pain. Respiratory: Denies shortness of breath. Gastrointestinal: Bloody drainage from JP drain in lower abdomen, persistent pain since hernia surgery about 5 weeks ago. Genitourinary: Negative for dysuria. Musculoskeletal: Negative for neck pain.  Negative for back pain. Integumentary: Negative for rash. Neurological: Negative for headaches, focal weakness or numbness.   ____________________________________________   PHYSICAL EXAM:  VITAL SIGNS: ED Triage Vitals  Enc Vitals Group     BP 02/07/19 1919 139/85     Pulse Rate 02/07/19 1919 (!) 107     Resp 02/07/19 1919 18     Temp 02/07/19 1919 98.9 F (37.2 C)     Temp Source 02/07/19 1919 Oral     SpO2 02/07/19 1919 97 %     Weight 02/07/19 1920 102.1 kg (225 lb)     Height 02/07/19 1920 1.626 m (5\' 4" )     Head Circumference --      Peak Flow --      Pain Score 02/07/19 1920 9     Pain Loc --      Pain Edu? --      Excl. in [place]? --     Constitutional: Alert and oriented.  Appears generally comfortable, in no acute distress at this time. Eyes: Conjunctivae are normal.  Head: Atraumatic. Nose: No congestion/rhinnorhea. Mouth/Throat: Patient  is wearing a mask. Neck: No stridor.  No meningeal signs.  Cardiovascular: Normal rate, regular rhythm. Good peripheral circulation. Grossly normal heart sounds. Respiratory: Normal respiratory effort.  No retractions. Gastrointestinal: Obese.  JP drain is in place with minimal serosanguineous fluid in the bulb.  Mild diffuse tenderness to palpation throughout the abdomen.  I do not appreciate any external cellulitis and there is no induration or fluctuance or obvious abscess/fluid collection inferior to the JP drain site. Musculoskeletal: No lower extremity tenderness nor edema. No gross deformities of extremities. Neurologic:  Normal speech and language. No gross focal neurologic deficits are appreciated.  Skin:  Skin is warm, dry and intact. Psychiatric: Mood and affect are normal. Speech and behavior are normal.  ____________________________________________   LABS (all labs ordered are listed, but only abnormal results are displayed)  Labs Reviewed  COMPREHENSIVE METABOLIC PANEL - Abnormal; Notable for the following components:      Result Value   Chloride 97 (*)    Glucose, Bld 304 (*)    BUN 26 (*)    Creatinine, Ser 1.27 (*)    Total Protein 8.2 (*)    GFR calc non Af Amer 50 (*)    GFR calc Af Amer 58 (*)    All other components within normal limits  CBC - Abnormal; Notable for the following components:   Hemoglobin 11.6 (*)    HCT 34.6 (*)    All other components within normal limits   ____________________________________________  EKG  No indication for EKG ____________________________________________  RADIOLOGY I, Hinda Kehr, personally viewed and evaluated these images (plain radiographs) as part of my medical decision making, as well as reviewing the written report by the radiologist.  ED MD interpretation: Near complete resolution of the intra-abdominal/pelvic fluid collection but with the development of a peripherally enhancing collection within the anterior  lower abdominal wall that could represent a seroma versus subcutaneous abscess.   Official radiology report(s): CT ABDOMEN PELVIS W CONTRAST  Result Date: 02/07/2019 CLINICAL DATA:  Hernia surgery on 01/02/2019 developed abscess has drain placed EXAM: CT ABDOMEN AND PELVIS WITH CONTRAST TECHNIQUE: Multidetector CT imaging of the abdomen and pelvis was performed using the standard protocol following bolus administration of intravenous contrast. CONTRAST:  125mL OMNIPAQUE IOHEXOL 300 MG/ML  SOLN COMPARISON:  January 12, 2019 FINDINGS: Lower chest: The visualized heart size within normal limits. No pericardial fluid/thickening. No hiatal hernia. The visualized portions of the lungs are clear. Hepatobiliary: The liver is normal in density without focal abnormality.The main portal vein is patent. No evidence of calcified gallstones, gallbladder wall thickening or biliary dilatation. Pancreas: Unremarkable. No pancreatic ductal dilatation or surrounding inflammatory changes. Spleen: Normal in size without focal abnormality. Adrenals/Urinary Tract: Both adrenal glands appear normal. The kidneys and collecting system appear normal without evidence of urinary tract calculus or hydronephrosis. Bladder is unremarkable. Stomach/Bowel: The stomach, small bowel, and colon are normal in appearance. Colonic anastomosis seen with the sigmoid rectal junction. Surgical clips seen within the deep pelvis. No inflammatory changes, wall thickening, or obstructive findings.The appendix is normal. Vascular/Lymphatic: There are no enlarged mesenteric, retroperitoneal, or pelvic lymph nodes. No significant vascular findings are present. Reproductive: The patient is status post hysterectomy. No adnexal masses or collections seen. Other: There is been interval placement of a pigtail catheter within the left mid abdomen. There is been interval near complete resolution of the intra-abdominal/pelvic collection on prior exam. There is  however interval development of a loculated peripherally enhancing collection within the subcutaneous tissues just inferior to this region measuring 6.6 x 2.3 x 2.7 cm  with surrounding fat stranding changes. The collection abuts the anterior lower abdominal wall fascial layer without intra-abdominal extension. Musculoskeletal: No acute or significant osseous findings. IMPRESSION: 1. Interval placement of pigtail catheter within the left mid abdomen with near complete resolution of intra-abdominal/pelvic collection. 2. development of a peripherally enhancing collection within the inferior lower abdominal wall subcutaneous tissues measuring 6.6 x 2.3 x 2.7 cm could represent seroma or subcutaneous abscess. Electronically Signed   By: Prudencio Pair M.D.   On: 02/07/2019 21:58    ____________________________________________   PROCEDURES   Procedure(s) performed (including Critical Care):  Procedures   ____________________________________________   INITIAL IMPRESSION / MDM / [place] / ED COURSE  As part of my medical decision making, I reviewed the following data within the [place] notes reviewed and incorporated, Labs reviewed , Old chart reviewed, Discussed with [facility] surgery (Dr. Trellis Moment) and Notes from prior ED visits   Differential diagnosis includes, but is not limited to, seroma, subcutaneous abscess, worsening intra-abdominal infection, other postoperative complication.  Patient is generally well-appearing.  She was initially mildly tachycardic at triage but that has resolved while she is lying in the exam bed.  She has only minimal tenderness to palpation and I can palpate no fluctuance or induration in the subcutaneous tissues around the JP drain which does appear functional with a small amount of serosanguineous fluid.  Her CT scan was generally reassuring except for the development of a seroma versus subcutaneous abscess.  Given that the patient  has received all of her care at [facility], I called at approximately 1:10 AM for a consult to general surgery at [facility].  The images have been power shared so they can review the imaging.  Anticipate the patient is appropriate for discharge and follow-up in the surgery clinic at [facility] on Monday but I will await recommendations by surgery team.      Clinical Course as of Feb 08 315  Sun Feb 08, 2019  0154 There was some confusion regarding to whom I should speak at [facility], but the [facility] center reported that the surgeon is in the [place] and will call me back when available.   [CF]  [id number] I spoke by phone with Dr. Trellis Moment, the on-call attending surgeon at [facility].  We discussed the case in detail including the imaging results.  We both agreed on the plan for starting the patient on Keflex empirically given the stranding changes in the fat surrounding the seroma versus abscess and they will try to see the patient on Monday in surgery clinic at [facility].  I will update the patient with these recommendations and give her a first dose of Keflex 500 mg by mouth in the ED prior to discharge.  I will provide my usual and customary return precautions.   [CF]  0304 Unfortunately the patient has an allergy listed to Keflex of hives.  My best alternative is clindamycin for both strep and staph coverage.  While I understand this increases the risk of developing a C. difficile infection, she has the possibility of subcutaneous infection and has had a complicated surgical course and I think that the risk of not treating until she can follow-up with her surgeon on Monday is greater than the risk of C. difficile.  I will give her a first dose of clindamycin 450 mg and prescribe her a course of 450 mg TID for 7 days and encourage her to ask her surgeon if she should continue taking it when  they see her on Monday.   [CF]    Clinical Course User Index [CF] Hinda Kehr, MD     ____________________________________________  FINAL CLINICAL  IMPRESSION(S) / ED DIAGNOSES  Final diagnoses:  Post-op pain  Abdominal wall seroma, initial encounter     MEDICATIONS GIVEN DURING THIS VISIT:  Medications  fentaNYL (SUBLIMAZE) injection 50 mcg (50 mcg Intravenous Given 02/07/19 2044)  ondansetron (ZOFRAN) injection 4 mg (4 mg Intravenous Given 02/07/19 2044)  iohexol (OMNIPAQUE) 300 MG/ML solution 125 mL (125 mLs Intravenous Contrast Given 02/07/19 2138)  clindamycin (CLEOCIN) capsule 450 mg (450 mg Oral Given 02/08/19 0314)     ED Discharge Orders         Ordered    clindamycin (CLEOCIN) 150 MG capsule  3 times daily     02/08/19 0310          *Please note:  Denyel Pabla was evaluated in Emergency Department on 02/08/2019 for the symptoms described in the history of present illness. She was evaluated in the context of the global COVID-19 pandemic, which necessitated consideration that the patient might be at risk for infection with the SARS-CoV-2 virus that causes COVID-19. Institutional protocols and algorithms that pertain to the evaluation of patients at risk for COVID-19 are in a state of rapid change based on information released by regulatory bodies including the CDC and federal and state organizations. These policies and algorithms were followed during the patient's care in the ED.  Some ED evaluations and interventions may be delayed as a result of limited staffing during the pandemic.*  Note:  This document was prepared using Dragon voice recognition software and may include unintentional dictation errors.   Hinda Kehr, MD 02/08/19 [phone]

## 2019-02-08 NOTE — ED Notes (Signed)
[facility] consult for general surgery

## 2019-02-26 ENCOUNTER — Ambulatory Visit (HOSPITAL_COMMUNITY): Payer: Medicare HMO | Admitting: Licensed Clinical Social Worker

## 2019-03-02 ENCOUNTER — Ambulatory Visit (INDEPENDENT_AMBULATORY_CARE_PROVIDER_SITE_OTHER): Payer: Medicare HMO | Admitting: Licensed Clinical Social Worker

## 2019-03-02 ENCOUNTER — Other Ambulatory Visit: Payer: Self-pay

## 2019-03-02 DIAGNOSIS — F3160 Bipolar disorder, current episode mixed, unspecified: Secondary | ICD-10-CM | POA: Diagnosis not present
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
## 2019-03-03 ENCOUNTER — Ambulatory Visit
Admission: RE | Admit: 2019-03-03 | Discharge: 2019-03-03 | Disposition: A | Discharge status: Home / Self Care | Payer: Medicare HMO | Source: Ambulatory Visit | Attending: Pain Medicine | Admitting: Pain Medicine

## 2019-03-03 ENCOUNTER — Other Ambulatory Visit: Payer: Self-pay

## 2019-03-03 ENCOUNTER — Ambulatory Visit (HOSPITAL_BASED_OUTPATIENT_CLINIC_OR_DEPARTMENT_OTHER): Payer: Medicare HMO | Admitting: Pain Medicine

## 2019-03-03 ENCOUNTER — Encounter: Payer: Self-pay | Admitting: Pain Medicine

## 2019-03-03 VITALS — BP 107/57 | HR 113 | Temp 97.5°F | Resp 19 | Ht 64.0 in | Wt 225.0 lb

## 2019-03-03 DIAGNOSIS — M545 Low back pain, unspecified: Secondary | ICD-10-CM

## 2019-03-03 DIAGNOSIS — M25572 Pain in left ankle and joints of left foot: Secondary | ICD-10-CM

## 2019-03-03 DIAGNOSIS — M51379 Other intervertebral disc degeneration, lumbosacral region without mention of lumbar back pain or lower extremity pain: Secondary | ICD-10-CM

## 2019-03-03 DIAGNOSIS — M5137 Other intervertebral disc degeneration, lumbosacral region: Secondary | ICD-10-CM | POA: Diagnosis present

## 2019-03-03 DIAGNOSIS — M79605 Pain in left leg: Secondary | ICD-10-CM | POA: Diagnosis present

## 2019-03-03 DIAGNOSIS — G8929 Other chronic pain: Secondary | ICD-10-CM

## 2019-03-03 DIAGNOSIS — M5127 Other intervertebral disc displacement, lumbosacral region: Secondary | ICD-10-CM | POA: Insufficient documentation

## 2019-03-03 DIAGNOSIS — G90522 Complex regional pain syndrome I of left lower limb: Secondary | ICD-10-CM | POA: Insufficient documentation

## 2019-03-03 DIAGNOSIS — G894 Chronic pain syndrome: Secondary | ICD-10-CM

## 2019-03-03 MED ORDER — MIDAZOLAM HCL 5 MG/5ML IJ SOLN
1.0000 mg | INTRAMUSCULAR | Status: DC | PRN
  Administered 2019-03-03: 10:00:00 3 mg via INTRAVENOUS
  Filled 2019-03-03: qty 5

## 2019-03-03 MED ORDER — FENTANYL CITRATE (PF) 100 MCG/2ML IJ SOLN
25.0000 ug | INTRAMUSCULAR | Status: DC | PRN
  Administered 2019-03-03: 100 ug via INTRAVENOUS
  Filled 2019-03-03: qty 2

## 2019-03-03 MED ORDER — ROPIVACAINE HCL 2 MG/ML IJ SOLN
20.0000 mL | Freq: Once | INTRAMUSCULAR | Status: AC
  Administered 2019-03-03: 10 mL
  Filled 2019-03-03: qty 20

## 2019-03-03 MED ORDER — DIPHENHYDRAMINE HCL 50 MG/ML IJ SOLN
12.5000 mg | Freq: Once | INTRAMUSCULAR | Status: AC
  Administered 2019-03-03: 50 mg via INTRAVENOUS
  Filled 2019-03-03: qty 1

## 2019-03-03 MED ORDER — CLINDAMYCIN HCL 300 MG PO CAPS: 300.0000 mg | ORAL_CAPSULE | Freq: Three times a day (TID) | ORAL | 0 refills | Status: AC

## 2019-03-03 MED ORDER — CLINDAMYCIN PHOSPHATE 900 MG/50ML IV SOLN
900.0000 mg | Freq: Once | INTRAVENOUS | Status: AC
  Administered 2019-03-03: 900 mg via INTRAVENOUS
  Filled 2019-03-03: qty 50

## 2019-03-03 MED ORDER — LIDOCAINE HCL 2 % IJ SOLN
20.0000 mL | Freq: Once | INTRAMUSCULAR | Status: AC
  Administered 2019-03-03: 400 mg
  Filled 2019-03-03: qty 20

## 2019-03-03 MED ORDER — LACTATED RINGERS IV SOLN
1000.0000 mL | Freq: Once | INTRAVENOUS | Status: AC
  Administered 2019-03-03: 1000 mL via INTRAVENOUS

## 2019-03-03 NOTE — Progress Notes (Signed)
PROVIDER NOTE: Information contained herein reflects review and annotations entered in association with encounter. Interpretation of such information and data should be left to medically-trained personnel. Information provided to patient can be located elsewhere in the medical record under "Patient Instructions". Document created using STT-dictation technology, any transcriptional errors that may result from process are unintentional.    Patient: [facility]  Service Category: Procedure  Provider: Gaspar Cola, MD  DOB: [birth year]  DOS: 03/03/2019  Location: [facility]  MRN: [id number]  Setting: Ambulatory - outpatient  Referring Provider: Milinda Pointer, MD  Type: Established Patient  Specialty: Interventional Pain Management  PCP: Sharyne Peach, MD   Primary Reason for Admission: Surgical management of chronic pain condition.  Procedure:  Anesthesia, Analgesia, Anxiolysis:  Type: Trial Spinal Cord Neurostimulator Implant (Percutaneous, interlaminar, posterior epidural placement) Purpose: To determine if a permanent implant may be effective in controlling some or all of Ms. Johannsen's chronic pain symptoms.  Region: Lumbar Level: L2-3 Laterality: Bilateral Paramedial  Type: Moderate (Conscious) Sedation combined with Local Anesthesia Indication(s): Analgesia and Anxiety Route: Intravenous (IV) IV Access: Secured Sedation: Meaningful verbal contact was maintained at all times during the procedure  Local Anesthetic: Lidocaine 1-2%   Indications: 1. CRPS (complex regional pain syndrome) type 1 of lower limb (left ankle)   2. Chronic low back pain (Primary Area of Pain) (Bilateral) (L>R)   3. Chronic lower extremity pain (Referred) (Secondary Area of Pain) (Left)   4. Chronic lumbosacral L5-S1 IVD protrusion (Bilateral)   5. DDD (degenerative disc disease), lumbosacral   6. Chronic pain syndrome    Pain Score: Pre-procedure: 8 /10 Post-procedure: 0-No pain/10    Pre-op Assessment:  Ms. Sagel is a 49 y.o. (year old), female patient, seen today for interventional treatment. She  has a past surgical history that includes prolapse rectum surgery (N/A, July 2016); Tonsillectomy; Abdominal hysterectomy; and Hernia repair (07/15/2017).  Initial Vital Signs:  Pulse/EKG Rate: (!) 113ECG Heart Rate: (!) 118 Temp: 98.4 F (36.9 C) Resp: 18 BP: (!) 133/98 SpO2: 97 %  BMI: Estimated body mass index is 38.62 kg/m as calculated from the following:   Height as of this encounter: 5\' 4"  (1.626 m).   Weight as of this encounter: 225 lb (102.1 kg).  Risk Assessment: Allergies: Reviewed. She is allergic to azithromycin; diazepam; levofloxacin; lisinopril; metronidazole; sulfa antibiotics; valproic acid; ziprasidone hcl; divalproex sodium; cephalexin; ciprofloxacin; and penicillins.  Allergy Precautions: None required Coagulopathies: Reviewed. None identified.  Blood-thinner therapy: None at this time Active Infection(s): Reviewed. None identified. Ms. Mcclurkin is afebrile  Site Confirmation: Ms. Helbig was asked to confirm the procedure and laterality before marking the site, which she did. Procedure checklist: Completed Consent: Before the procedure and under the influence of no sedative(s), amnesic(s), or anxiolytics, the patient was informed of the treatment options, risks and possible complications. To fulfill our ethical and legal obligations, as recommended by the American Medical Association's Code of Ethics, I have informed the patient of my clinical impression; the nature and purpose of the treatment or procedure; the risks, benefits, and possible complications of the intervention; the alternatives, including doing nothing; the risk(s) and benefit(s) of the alternative treatment(s) or procedure(s); and the risk(s) and benefit(s) of doing nothing.  Ms. Bartkiewicz was provided with information about the general risks and possible complications associated with most  interventional procedures. These include, but are not limited to: failure to achieve desired goals, infection, bleeding, organ or nerve damage, allergic reactions, paralysis, and/or death.  In addition, she was informed of those risks and possible complications associated to this particular procedure, which include, but are not limited to: damage to the implant; failure to decrease pain; local, systemic, or serious CNS infections, intraspinal abscess with possible cord compression and paralysis, or life-threatening such as meningitis; intrathecal and/or epidural bleeding with formation of hematoma with possible spinal cord compression and permanent paralysis; organ damage; nerve injury or damage with subsequent sensory, motor, and/or autonomic system dysfunction, resulting in transient or permanent pain, numbness, and/or weakness of one or several areas of the body; allergic reactions, either minor or major life-threatening, such as anaphylactic or anaphylactoid reactions.  Furthermore, Ms. Duffield was informed of those risks and complications associated with the medications. These include, but are not limited to: allergic reactions (i.e.: anaphylactic or anaphylactoid reactions); arrhythmia;  Hypotension/hypertension; cardiovascular collapse; respiratory depression and/or shortness of breath; swelling or edema; medication-induced neural toxicity; particulate matter embolism and blood vessel occlusion with resultant organ, and/or nervous system infarction and permanent paralysis.  Finally, she was informed that Medicine is not an exact science; therefore, there is also the possibility of unforeseen or unpredictable risks and/or possible complications that may result in a catastrophic outcome. The patient indicated having understood very clearly. We have given the patient no guarantees and we have made no promises. Enough time was given to the patient to ask questions, all of which were answered to the patient's  satisfaction. Ms. Loader has indicated that she wanted to continue with the procedure. Attestation: I, the ordering provider, attest that I have discussed with the patient the benefits, risks, side-effects, alternatives, likelihood of achieving goals, and potential problems during recovery for the procedure that I have provided informed consent. Date  Time: 03/03/2019  8:07 AM  Pre-Procedure Preparation:  Monitoring: As per clinic protocol. Respiration, ETCO2, SpO2, BP, heart rate and rhythm monitor placed and checked for adequate function Safety Precautions: Patient was assessed for positional comfort and pressure points before starting the procedure. Time-out: I initiated and conducted the "Time-out" before starting the procedure, as per protocol. The patient was asked to participate by confirming the accuracy of the "Time Out" information. Verification of the correct person, site, and procedure were performed and confirmed by me, the nursing staff, and the patient. "Time-out" conducted as per Joint Commission's Universal Protocol (UP.01.01.01). Time: 0905  Description of Procedure Process:   Position: Prone Target Area: Posterior epidural space Approach: Posterior percutaneous, paramedial, interlaminar approach Area Prepped: Bilateral thoraco-lumbar Region Prepping solution: ChloraPrep (2% chlorhexidine gluconate and 70% isopropyl alcohol) Safety Precautions: Safe injection practices and needle disposal techniques used. Medications properly checked for expiration dates. SDV (single dose vial) medications used. Aspiration looking for blood return and/or CSF was conducted prior to all injections. At no point did I inject any substances, as a needle was being advanced. No attempts were made at seeking any paresthesias.  Description of the Procedure: Availability of a responsible, adult driver, and NPO status confirmed. Informed consent was obtained after having discussed risks and possible  complications. An IV was started. The patient was then taken to the fluoroscopy suite, where the patient was placed in position for the procedure, over the fluoroscopy table. The patient was then monitored in the usual manner. Fluoroscopy was manipulated to obtain the best possible view of the target. Parallex error was corrected before commencing the procedure. Once a clear view of the target had been obtained, the skin and deeper tissues over the procedure site were infiltrated using lidocaine, loaded in  a 10 cc luer-loc syringe with a 0.5 inch, 25-G needle. The introducer needle(s) was/were then inserted through the skin and deeper tissues. A paramidline approach was used to enter the posterior epidural space at a 30 angle, using "Loss-of-resistance Technique" with 3 ml of PF-NaCl (0.9% NSS). Correct needle placement was confirmed in the antero-posterior and lateral fluoroscopic views. The lead was gently introduced and manipulated under real-time fluoroscopy, constantly assessing for pain, discomfort, or paresthesias, until the tip rested at the desired level. Both sides were done in identical fashion. Electrode placement was tested until appropriate coverage was attained. Once the patient confirmed that the stimulation was over the desired area, the lead(s) was/were secured in place and the introducer needles removed. This was done under real-time fluoroscopy while observing the electrode tip to avoid unintended migration. The area was covered with a non-occlusive dressing and the patient transported to recovery for further programming.  Vitals:   03/03/19 0955 03/03/19 1005 03/03/19 1015 03/03/19 1025  BP: 108/74 (!) 104/56 (!) 114/53 (!) 107/57  Pulse:      Resp: 12 (!) 21 12 19   Temp:  (!) 97.3 F (36.3 C)  (!) 97.5 F (36.4 C)  TempSrc:      SpO2: 99% 99% 99% 100%  Weight:      Height:       Start Time: 0905 hrs. End Time: 0955 hrs.  Neurostimulator Details:   Lead(s):  Brand:  Medtronic Epidural Access Level:  T12-L1 T12-L1  Lead implant:  Bilateral   No. of Electrodes/Lead:  8 8  Laterality:  Left Right  Top electrode location:  T8 T8  Bottom electrode location:  T8 T7-8  Model No.: [id number] Same  Length: 60 cm Same  Lot No.: [id number]  MRI compatibility:  Yes Yes  External Neurostimulator    Model No.: [id number]   Serial No.: [id number] N    Imaging Guidance (Spinal):          Type of Imaging Technique: Fluoroscopy Guidance (Spinal) Indication(s): Assistance in needle guidance and placement for procedures requiring needle placement in or near specific anatomical locations not easily accessible without such assistance. Exposure Time: Please see nurses notes. Contrast: None used. Fluoroscopic Guidance: I was personally present during the use of fluoroscopy. "Tunnel Vision Technique" used to obtain the best possible view of the target area. Parallax error corrected before commencing the procedure. "Direction-depth-direction" technique used to introduce the needle under continuous pulsed fluoroscopy. Once target was reached, antero-posterior, oblique, and lateral fluoroscopic projection used confirm needle placement in all planes. Images permanently stored in EMR.     Interpretation: No contrast injected. I personally interpreted the imaging intraoperatively. Adequate needle placement confirmed in multiple planes. Permanent images saved into the patient's record.  Antibiotic Prophylaxis:   Anti-infectives (From admission, onward)   Start     Dose/Rate Route Frequency Ordered Stop   03/03/19 0900  clindamycin (CLEOCIN) IVPB 900 mg     900 mg 100 mL/hr over 30 Minutes Intravenous  Once 03/03/19 0825 03/03/19 0915   03/03/19 0000  clindamycin (CLEOCIN) 300 MG capsule     300 mg Oral 3 times daily 03/03/19 0825 03/10/19 2359     Indication(s): None identified  Post-operative Assessment:  Post-procedure Vital Signs:  Pulse/HCG Rate: (!) 113(!)  108 Temp: (!) 97.5 F (36.4 C) Resp: 19 BP: (!) 107/57 SpO2: 123XX123 %  Complications: No immediate post-treatment complications observed by team, or reported by patient.  Note: The patient tolerated the entire procedure well. A  repeat set of vitals were taken after the procedure and the patient was kept under observation following institutional policy, for this type of procedure. Post-procedural neurological assessment was performed, showing return to baseline, prior to discharge. The patient was provided with post-procedure discharge instructions, including a section on how to identify potential problems. Should any problems arise concerning this procedure, the patient was given instructions to immediately contact us, at any time, without hesitation. In any case, we plan to contact the patient by telephone for a follow-up status report regarding this interventional procedure.  Comments:  No additional relevant information.  Plan of Care  Orders:  Orders Placed This Encounter  Procedures  . [facility] representative to make sure they are available to provide required equipment.    Scheduling Instructions:     Side: Bilateral     Level: Lumbar     Device: Medtronic     Sedation: With sedation     Timeframe: Today    Order Specific Question:   Where will this procedure be performed?    Answer:   [facility]  . [facility] C-ARM 1-60 MIN NO REPORT    Intraoperative interpretation by procedural physician at [facility].    Standing Status:   Standing    Number of Occurrences:   1    Order Specific Question:   Reason for exam:    Answer:   Assistance in needle guidance and placement for procedures requiring needle placement in or near specific anatomical locations not easily accessible without such assistance.  . Informed Consent Details: Physician/Practitioner Attestation; Transcribe to consent form and obtain patient signature     Provider Attestation: I, [place] Dossie Arbour, MD, (Pain Management Specialist), the physician/practitioner, attest that I have discussed with the patient the benefits, risks, side effects, alternatives, likelihood of achieving goals and potential problems during recovery for the procedure that I have provided informed consent.    Scheduling Instructions:     Procedure: Neurostimulator Trial     Indication/Reason: Regional Chronic Pain Syndrome     Note: Always confirm laterality of pain with Ms. [place], before procedure.     Transcribe to consent form and obtain patient signature.  . Provide equipment / supplies at bedside    Epidural Tray (x1) Sterile towel pack (x2) Small streight hemostat (x2) Large hemostat (x1) Sutures (cutting needle) 0-silk Needle holder (x1) Scissors (Mayo) (x1) 3/4" Steri-strip pack (x2) 4x4 Gauze Pack (x1) Large and Medium size sterile transparent dressings    Standing Status:   Standing    Number of Occurrences:   1    Order Specific Question:   Specify    Answer:   Epidural Tray & Minor Surgery Tray   Chronic Opioid Analgesic:  Tramadol 50 mg, 1-2 tab q 6 hrs (200-400 mg/day of tramadol) High-Risk due to history of a prior suicidal attempt w/ medications. MME/day:20-40mg /day.   Medications administered: We administered lidocaine, lactated ringers, midazolam, fentaNYL, ropivacaine (PF) 2 mg/mL (0.2%), clindamycin, and diphenhydrAMINE.  See the medical record for exact dosing, route, and time of administration.  Follow-up plan:   Return in about 6 days (around 03/09/2019) for (F2F) SCS removal.       Interventional management options:  Considering:   NOTE: The patient is medical psychology evaluation indicates that the patient is a "suitable candidate" for a spinal cord stimulator trial. Possible lumbar spinal cord stimulator trial Diagnosticbilateral lumbar facet nerve block Possible bilateral lumbar facetRFA  Diagnostic left-sided L5-S1  transforaminal ESI Diagnostic left-sided L5 selective nerve root block Possible left-sided L5 DRG RFA Diagnostic left-sidedlumbarsympathetic nerve block Possible left-sided lumbar sympathetic RFA Diagnostic bilateral cervical facet nerve block Possible bilateral cervical facetRFA Diagnosticleft intra-articular shoulder injection Diagnosticleft suprascapular nerve block Diagnostic left suprascapularRFA   Palliative PRN treatment(s):   Diagnostic left L5 TFESI #2 + right L4-5 LESI #2  Palliative bilateral lumbar facet blocks Palliative right-sided lumbar facet RFA #2 (last done 04/22/2018) Palliative left-sided lumbar facet RFA #2 (last done 03/13/2018)    Recent Visits Date Type Provider Dept  01/21/19 Telemedicine Milinda Pointer, [facility]  12/08/18 Office Visit Milinda Pointer, MD [facility]  Showing recent visits within past 90 days and meeting all other requirements   Today's Visits Date Type Provider Dept  03/03/19 Procedure visit Milinda Pointer, MD [facility]  Showing today's visits and meeting all other requirements   Future Appointments Date Type Provider Dept  03/10/19 Appointment Milinda Pointer, MD [facility]  Showing future appointments within next 90 days and meeting all other requirements   Disposition: Discharge home  Discharge (Date  Time): 03/03/2019; 1032 hrs.   Primary Care Physician: Sharyne Peach, MD Location: [facility] Note by: Gaspar Cola, MD Date: 03/03/2019; Time: 10:39 AM

## 2019-03-03 NOTE — Progress Notes (Signed)
Safety precautions to be maintained throughout the outpatient stay will include: orient to surroundings, keep bed in low position, maintain call bell within reach at all times, provide assistance with transfer out of bed and ambulation.  

## 2019-03-03 NOTE — Patient Instructions (Signed)
____________________________________________________________________________________________  Post-Procedure Discharge Instructions  Instructions:  Apply ice:   Purpose: This will minimize any swelling and discomfort after procedure.   When: Day of procedure, as soon as you get home.  How: Fill a plastic sandwich bag with crushed ice. Cover it with a small towel and apply to injection site.  How long: (15 min on, 15 min off) Apply for 15 minutes then remove x 15 minutes.  Repeat sequence on day of procedure, until you go to bed.  Apply heat:   Purpose: To treat any soreness and discomfort from the procedure.  When: Starting the next day after the procedure.  How: Apply heat to procedure site starting the day following the procedure.  How long: May continue to repeat daily, until discomfort goes away.  Food intake: Start with clear liquids (like water) and advance to regular food, as tolerated.   Physical activities: Keep activities to a minimum for the first 8 hours after the procedure. After that, then as tolerated.  Driving: If you have received any sedation, be responsible and do not drive. You are not allowed to drive for 24 hours after having sedation.  Blood thinner: (Applies only to those taking blood thinners) You may restart your blood thinner 6 hours after your procedure.  Insulin: (Applies only to Diabetic patients taking insulin) As soon as you can eat, you may resume your normal dosing schedule.  Infection prevention: Keep procedure site clean and dry. Shower daily and clean area with soap and water.  Post-procedure Pain Diary: Extremely important that this be done correctly and accurately. Recorded information will be used to determine the next step in treatment. For the purpose of accuracy, follow these rules:  Evaluate only the area treated. Do not report or include pain from an untreated area. For the purpose of this evaluation, ignore all other areas of pain,  except for the treated area.  After your procedure, avoid taking a long nap and attempting to complete the pain diary after you wake up. Instead, set your alarm clock to go off every hour, on the hour, for the initial 8 hours after the procedure. Document the duration of the numbing medicine, and the relief you are getting from it.  Do not go to sleep and attempt to complete it later. It will not be accurate. If you received sedation, it is likely that you were given a medication that may cause amnesia. Because of this, completing the diary at a later time may cause the information to be inaccurate. This information is needed to plan your care.  Follow-up appointment: Keep your post-procedure follow-up evaluation appointment after the procedure (usually 2 weeks for most procedures, 6 weeks for radiofrequencies). DO NOT FORGET to bring you pain diary with you.   Expect: (What should I expect to see with my procedure?)  From numbing medicine (AKA: Local Anesthetics): Numbness or decrease in pain. You may also experience some weakness, which if present, could last for the duration of the local anesthetic.  Onset: Full effect within 15 minutes of injected.  Duration: It will depend on the type of local anesthetic used. On the average, 1 to 8 hours.   From steroids (Applies only if steroids were used): Decrease in swelling or inflammation. Once inflammation is improved, relief of the pain will follow.  Onset of benefits: Depends on the amount of swelling present. The more swelling, the longer it will take for the benefits to be seen. In some cases, up to 10 days.    Duration: Steroids will stay in the system x 2 weeks. Duration of benefits will depend on multiple posibilities including persistent irritating factors.  Side-effects: If present, they may typically last 2 weeks (the duration of the steroids).  Frequent: Cramps (if they occur, drink Gatorade and take over-the-counter Magnesium 450-500 mg  once to twice a day); water retention with temporary weight gain; increases in blood sugar; decreased immune system response; increased appetite.  Occasional: Facial flushing (red, warm cheeks); mood swings; menstrual changes.  Uncommon: Long-term decrease or suppression of natural hormones; bone thinning. (These are more common with higher doses or more frequent use. This is why we prefer that our patients avoid having any injection therapies in other practices.)   Very Rare: Severe mood changes; psychosis; aseptic necrosis.  From procedure: Some discomfort is to be expected once the numbing medicine wears off. This should be minimal if ice and heat are applied as instructed.  Call if: (When should I call?)  You experience numbness and weakness that gets worse with time, as opposed to wearing off.  New onset bowel or bladder incontinence. (Applies only to procedures done in the spine)  Emergency Numbers:  Durning business hours (Monday - Thursday, 8:00 AM - 4:00 PM) (Friday, 9:00 AM - 12:00 Noon): [phone]  After hours: [phone]  NOTE: If you are having a problem and are unable connect with, or to talk to a provider, then go to your nearest urgent care or emergency department. If the problem is serious and urgent, please call 911. ____________________________________________________________________________________________    Spinal Cord Stimulation Trial Information A spinal cord stimulation trial is a test to see whether a spinal cord stimulator reduces your pain. A spinal cord stimulator is a small device that is inserted (implanted) in your back. The stimulator has small wires (leads) that connect it to your spinal cord. The stimulator sends electrical pulses through the leads to the spinal cord. This can relieve pain. Settings for the stimulator can be adjusted with a remote device to find the best pain control. Your health care provider may suggest a spinal cord  stimulation trial if other treatments for long-lasting (chronic) pain have not worked for you. Spinal cord stimulation may be used to manage pain that is caused by:  Coronary artery disease or peripheral vascular disease.  Failed back surgery.  Phantom limb sensation.  Peripheral neuropathy.  Complex regional pain syndrome.  Other syndromes that involve chronic pain. For the trial, the stimulator is attached to your back instead of inserted under the skin. A trial period is usually 3-5 days, but this can vary among health care providers. After your trial period, you and your health care provider will discuss whether a permanent spinal cord stimulator is an option for you. The permanent stimulator may be an option depending on:  Whether the stimulator reduces your pain during the trial.  Whether the stimulator fits into your lifestyle.  Whether the cost of the stimulator is covered by your insurance. What are the risks? Generally, a spinal cord stimulation trial is safe. However, problems can occur, including:  Bleeding or pain at the insertion site of the leads.  Infection at the insertion site or around the leads.  Allergic reactions to medicines, devices, or dyes.  Damage to the skin, nerves, back muscles, or spinal cord where the leads are placed.  Inability to move the legs (paralysis).  Numbness in the legs.  Inability to control when you urinate or have a bowel movement (incontinence).    Spinal fluid leakage. How is a spinal cord stimulator placed for a trial?  For a trial period, the stimulator is placed on your skin, not under it. Only the leads that connect the stimulator to the spinal cord are implanted under your skin. The exact location of the stimulator depends on where you have pain. There are two types of surgery for implanting the leads:  Noninvasive surgery. In this type of surgery, a small incision is made and needles are used to place the leads under your  skin.  Open surgery. In this type of surgery, a larger incision is made, and the leads are implanted directly into your back. How should I care for myself during the trial period? Activity  Return to your normal activities as told by your health care provider. Ask your health care provider what activities are safe for you.  Do not lift anything that is heavier than 10 lb (4.5 kg), or the limit that you are told. General instructions  Follow your health care provider's specific instructions about how to take care of your spinal cord stimulator and your incision.  Make sure you write down the following information so that you can share this information with your health care provider: ? Your responses to the stimulator. Describe these as told by your health care provider. ? Your pain level throughout the day. ? The amount and kind of pain medicine that you take.  Take over-the-counter and prescription medicines only as told by your health care provider.  Do not take baths, swim, or use a hot tub until your health care provider approves.  Tell all health care providers who provide care for you that you have a spinal cord stimulator. This is important information that could affect the medical treatment that you receive.  Keep all follow-up visits as told by your health care provider. This is important. When should I seek medical care? During the trial, seek medical care if:  You have redness, swelling, or pain around your incision.  You have fluid or blood coming from your incision.  Your incision feels warm to the touch.  You have pus or a bad smell coming from your incision.  The bandage (dressing) that covers your incision comes off. Get help right away if:  Your pain gets worse.  The stimulator leads come out.  You develop numbness or weakness in your legs, or you have difficulty walking.  You have problems urinating or having a bowel movement.  You have a fever.  You  have symptoms that last for more than 2-3 days.  Your symptoms suddenly get worse. Summary  A spinal cord stimulator is a small device that sends electrical pulses to your spinal cord. This can relieve pain caused by many different health conditions.  Before a permanent stimulator is placed, you will have a trial using a temporary stimulator that is not implanted under your skin. This helps determine if a stimulator will reduce your pain.  For the trial, only the leads that connect the stimulator to the spinal cord are implanted under your skin.  During the trial period, make sure you write down information about your pain and your responses to the stimulator so that you can share this information with your health care provider.  Keep all follow-up visits as told by your health care provider. This is important. Contact your health care provider if you have symptoms that indicate a problem. This information is not intended to replace advice given to you  by your health care provider. Make sure you discuss any questions you have with your health care provider. Document Revised: 10/24/2018 Document Reviewed: 03/05/2018 Elsevier Patient Education  [place].

## 2019-03-04 ENCOUNTER — Encounter: Payer: Self-pay | Admitting: Pain Medicine

## 2019-03-04 ENCOUNTER — Telehealth: Payer: Self-pay

## 2019-03-04 NOTE — Telephone Encounter (Signed)
Post procedure phone call.  LM 

## 2019-03-07 ENCOUNTER — Encounter: Payer: Self-pay | Admitting: Pain Medicine

## 2019-03-10 ENCOUNTER — Ambulatory Visit: Payer: Medicare HMO | Attending: Pain Medicine | Admitting: Pain Medicine

## 2019-03-10 ENCOUNTER — Other Ambulatory Visit: Payer: Self-pay

## 2019-03-10 VITALS — BP 111/86 | HR 115 | Temp 97.9°F | Resp 16 | Ht 64.0 in | Wt 225.0 lb

## 2019-03-10 DIAGNOSIS — M25572 Pain in left ankle and joints of left foot: Secondary | ICD-10-CM | POA: Diagnosis present

## 2019-03-10 DIAGNOSIS — M545 Low back pain, unspecified: Secondary | ICD-10-CM

## 2019-03-10 DIAGNOSIS — M79605 Pain in left leg: Secondary | ICD-10-CM | POA: Insufficient documentation

## 2019-03-10 DIAGNOSIS — G894 Chronic pain syndrome: Secondary | ICD-10-CM

## 2019-03-10 DIAGNOSIS — G90522 Complex regional pain syndrome I of left lower limb: Secondary | ICD-10-CM | POA: Diagnosis present

## 2019-03-10 DIAGNOSIS — G8929 Other chronic pain: Secondary | ICD-10-CM

## 2019-03-10 NOTE — Progress Notes (Signed)
PROVIDER NOTE: Information contained herein reflects review and annotations entered in association with encounter. Interpretation of such information and data should be left to medically-trained personnel. Information provided to patient can be located elsewhere in the medical record under "Patient Instructions". Document created using STT-dictation technology, any transcriptional errors that may result from process are unintentional.    Patient: Marie Clarke  Service Category: Procedure  Provider: Gaspar Cola, MD  DOB: 07-02-1970  DOS: 03/10/2019  Location: [facility]  MRN: [id number]  Setting: Ambulatory - outpatient  Referring Provider: Sharyne Peach, MD  Type: Established Patient  Specialty: Interventional Pain Management  PCP: Sharyne Peach, MD   Primary Reason for Admission: Surgical management of chronic pain condition.  Procedure:  Anesthesia, Analgesia, Anxiolysis:  Type: Removal of Trial Neurostimulator Leads Purpose: End-of-trial Region: Lumbar Laterality: Bilateral   None required   Indications: 1. Chronic pain syndrome   2. Chronic low back pain (Primary Area of Pain) (Bilateral) (L>R)   3. Chronic lower extremity pain (Referred) (Secondary Area of Pain) (Left)   4. Chronic ankle pain [facility] Area of Pain) (Left)   5. CRPS (complex regional pain syndrome) type 1 of lower limb (left ankle)    Pain Score: Pre-procedure: 0-No pain/10 Post-procedure: 0-No pain/10   NOTE: On 03/03/2019 the patient underwent a successful bilateral lumbar spinal cord stimulator trial implant, which we have removed today at the end of the trial.  The patient was trialed with 2 [employer] percutaneous electrode lead system.  The patient returns today indicating that it eliminated 85% of her pain, it allow for her to have increased range of motion and increase level of activity where she was able to walk for greater distances without having to stop because of the pain.  She  also indicated that while she normally takes at least 3 tramadol 50 mg tablets per day, she was able to go for an entire week where she only took 3 pills during the entire 1 week.  Clearly this was a successful trial and therefore I would recommend to proceed with the permanent implant the patient will be sent to Dr. Lysle Morales for the permanent implant.  We will continue to manage the patient after the implant.             Neuromodulation Implant Therapy Assessment  Epidural Neurostimulator implant: Side-effects or Adverse reactions: None reported Effectiveness: Described as relatively effective, allowing for increase in activities of daily living (ADL).  In fact, she was very enthusiastic about it and she described it as very effective having controlled 85% of her pain and having allowed her to take less medication and do more activity. Plan: Today we have removed the trial leads without any problems.  We have recommended for the patient to continue taking her oral antibiotics for another 3 days and to clean the area 2-3 times a day with soap and water and some alcohol.  She did have a little redness around the leads that seem to be superficial.  She denies any fever, night sweats, chills, or any other infection related symptoms.  Pre-op Assessment:  Marie Clarke is a 49 y.o. (year old), female patient, seen today for removal of neurostimulator trial lead(s). She  has a past surgical history that includes prolapse rectum surgery (N/A, July 2016); Tonsillectomy; Abdominal hysterectomy; and Hernia repair (07/15/2017).  Initial Vital Signs:  Pulse: (!) 115  Temp: 97.9 F (36.6 C) Resp: 16 BP: 111/86 SpO2: 99 %  BMI: Estimated  body mass index is 38.62 kg/m as calculated from the following:   Height as of this encounter: _0  (1.626 m).   Weight as of this encounter: 225 lb (102.1 kg).  Risk Assessment: Allergies: Reviewed. She is allergic to azithromycin; diazepam; levofloxacin; lisinopril;  metronidazole; sulfa antibiotics; valproic acid; ziprasidone hcl; divalproex sodium; cephalexin; ciprofloxacin; and penicillins.  Allergy Precautions: None required Coagulopathies: Reviewed. None identified.  Blood-thinner therapy: None at this time Active Infection(s): Reviewed. None identified. Marie Clarke is afebrile  Site Confirmation: Marie Clarke was asked to confirm the procedure and laterality before marking the site, which she did. Procedure checklist: Completed Consent: Marie Clarke consents to removal of trial leads. Attestation: I, the ordering provider, attest that I have discussed with the patient the risks and potential problems associated with procedure. Date  Time: 03/10/2019 11:16 AM  Pre-Procedure Preparation:  Monitoring: As per clinic protocol. Respiration, ETCO2, SpO2, BP, heart rate and rhythm monitor placed and checked for adequate function Safety Precautions: Patient was assessed for positional comfort and pressure points before starting the procedure. Time-out: I initiated and conducted the "Time-out" before starting the procedure, as per protocol. The patient was asked to participate by confirming the accuracy of the "Time Out" information. Verification of the correct person, site, and procedure were performed and confirmed by me, the nursing staff, and the patient. "Time-out" conducted as per Joint Commission's Universal Protocol (UP.01.01.01). Time: 1130  Description of Procedure Process:   Position: Sitting Area Prepped: Around implant device site Prepping solution: ChloraPrep (2% chlorhexidine gluconate and 70% isopropyl alcohol) Safety Precautions: Sterile technique used  Description of the Procedure: Availability of a responsible, adult driver, and NPO status confirmed. Informed consent was obtained after having discussed risks and possible complications. An IV was started. The patient was then taken to the fluoroscopy suite, where the patient was placed in position for  the procedure, over the fluoroscopy table. The patient was then monitored in the usual manner. The bandages were removed and the surgical area was prepped using a broad-spectrum topical antiseptic. Meaningful verbal contact was maintained with the patient at all times. The stitches were then removed and the patient asked to mildly flex the spine. Using constant tension over the leads, the electrodes were removed in their entirety, without any apparent complication. The patient tolerated the entire procedure well. Following the performance of this procedure, the patient was kept under observation until the discharge criteria were met. The patient was sent home in stable condition. The patient was provided with discharge instructions, including a section on how to identify potential problems. Should any problems arise concerning this procedure, the patient was given instructions to contact us, without hesitation. In any case, we will contact the patient by telephone for a follow-up status report regarding this interventional procedure.  Vitals:   03/10/19 1114  BP: 111/86  Pulse: (!) 115  Resp: 16  Temp: 97.9 F (36.6 C)  TempSrc: Temporal  SpO2: 99%  Weight: 225 lb (102.1 kg)  Height: _1  (1.626 m)   Start Time: 1149 hrs. End Time:  1156 hrs.  Materials: Sterile suture removal kit; Sterile gloves; Sterile gauze; Band-aid  Medication(s): N/A  Post-operative Assessment:  Post-procedure Vital Signs:  Pulse/HCG Rate: (!) 115  Temp: 97.9 F (36.6 C) Resp: 16 BP: 111/86 SpO2: 99 %  Complications: No immediate post-treatment complications observed by team, or reported by patient.  Note: The patient tolerated the entire procedure well. A repeat set of vitals were taken after the procedure  and the patient was kept under observation following institutional policy, for this type of procedure. Post-procedural neurological assessment was performed, showing return to baseline, prior to  discharge. The patient was provided with post-procedure discharge instructions, including a section on how to identify potential problems. Should any problems arise concerning this procedure, the patient was given instructions to immediately contact us, at any time, without hesitation. In any case, we plan to contact the patient by telephone for a follow-up status report regarding this interventional procedure.  Comments:  No additional relevant information.  Plan of Care  Orders:  Orders Placed This Encounter  Procedures  . Suture removal kit    Please have suture removal kit available in exam room.    Standing Status:   Standing    Number of Occurrences:   1  . Ambulatory referral to Neurosurgery    Referral Priority:   Routine    Referral Type:   Surgical    Referral Reason:   Specialty Services Required    Referred to Provider:   [name], MD    Number of Visits Requested:   1  . Provide equipment / supplies at bedside    Equipment required: Single use, disposable, "Suture Removal Kit"    Standing Status:   Standing    Number of Occurrences:   1    Order Specific Question:   Specify    Answer:   Suture Removal Kit  . Care order/instruction: 1. Get sterile gloves; suture/staple removal kit; Benzoin tincture; 3/4" steri trips; sterile bandages. 2. Please uncover surgical wound and prep with antiseptic solution (Chloraprep, if not allergic, otherwise use 70% alco...    1. Get sterile gloves; suture/staple removal kit; Benzoin tincture; 3/4" steri trips; sterile bandages.  2. Please uncover surgical wound and prep with antiseptic solution (Chloraprep, if not allergic, otherwise use 70% alcohol).  3. Notify Healthcare provider to inspect wound healing. 4. Remove suture/staples. 5. Clean area again with alcohol and allow to dry. 6. Apply Benzoin tincture on both sides of incision and allow to dry. 7. Apply steri-strips 8. Re-dress with sterile bandages.    Standing Status:    Standing    Number of Occurrences:   1  . LEAD REMOVAL    Have suture removal kit available in room. Other equipment: Alcohol Prep; sterile gloves; sterile scissors (suture removal kit); Band-Aid; 4x4 gauze.    Scheduling Instructions:     Procedure: Neurostimulator lead removal     Laterality: N/A     Sedation: No Sedation     Timeframe:  Today   Chronic Opioid Analgesic:  Tramadol 50 mg, 1-2 tab q 6 hrs (200-400 mg/day of tramadol) High-Risk due to history of a prior suicidal attempt w/ medications. MME/day:20-102m/day.   [facility].  See the medical record for exact dosing, route, and time of administration.  Follow-up plan:   Return in about 2 weeks (around 03/24/2019) for (VV), (PP).       Interventional management options:  Considering:   NOTE: The patient is medical psychology evaluation indicates that the patient is a "suitable candidate" for a spinal cord stimulator trial. Possible lumbar spinal cord stimulator trial Diagnosticbilateral lumbar facet nerve block Possible bilateral lumbar facetRFA Diagnostic left-sided L5-S1 transforaminal ESI Diagnostic left-sided L5 selective nerve root block Possible left-sided L5 DRG RFA Diagnostic left-sidedlumbarsympathetic nerve block Possible left-sided lumbar sympathetic RFA Diagnostic bilateral cervical facet nerve block Possible bilateral cervical facetRFA Diagnosticleft intra-articular shoulder  injection Diagnosticleft suprascapular nerve block Diagnostic left suprascapularRFA   Palliative PRN treatment(s):   Diagnostic left L5 TFESI #2 + right L4-5 LESI #2  Palliative bilateral lumbar facet blocks Palliative right-sided lumbar facet RFA #2 (last done 04/22/2018) Palliative left-sided lumbar facet RFA #2 (last done 03/13/2018)    Recent Visits Date Type Provider Dept  03/03/19 Procedure visit Milinda Pointer, MD [facility]  01/21/19 Telemedicine Milinda Pointer, MD [facility]  Showing recent visits within past 90 days and meeting all other requirements   Today's Visits Date Type Provider Dept  03/10/19 Procedure visit Milinda Pointer, MD [facility]  Showing today's visits and meeting all other requirements   Future Appointments No visits were found meeting these conditions.  Showing future appointments within next 90 days and meeting all other requirements   Disposition: Discharge home  Discharge (Date  Time): 03/10/2019; 1200 hrs.   Primary Care Physician: Sharyne Peach, MD Location: [facility] Note by: Gaspar Cola, MD Date: 03/10/2019; Time: 12:25 PM

## 2019-03-10 NOTE — Patient Instructions (Signed)
Continue taking antibiotics 3 more days. Clean the area 3 times per day with soap and water. Keep the area clean and dry.

## 2019-03-11 ENCOUNTER — Telehealth: Payer: Self-pay

## 2019-03-11 NOTE — Telephone Encounter (Signed)
Post procedure phone call.  LM 

## 2019-03-11 NOTE — Telephone Encounter (Signed)
Patient has appt with DR. Cook for Feb 9th SCS eval

## 2019-03-11 NOTE — Telephone Encounter (Signed)
POst procedure phone call.  LM  

## 2019-03-12 DIAGNOSIS — G43709 Chronic migraine without aura, not intractable, without status migrainosus: Secondary | ICD-10-CM | POA: Insufficient documentation

## 2019-03-12 DIAGNOSIS — IMO0002 Reserved for concepts with insufficient information to code with codable children: Secondary | ICD-10-CM | POA: Insufficient documentation

## 2019-03-16 ENCOUNTER — Ambulatory Visit (INDEPENDENT_AMBULATORY_CARE_PROVIDER_SITE_OTHER): Payer: Medicare HMO | Admitting: Licensed Clinical Social Worker

## 2019-03-16 DIAGNOSIS — F3160 Bipolar disorder, current episode mixed, unspecified: Secondary | ICD-10-CM
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
## 2019-03-20 ENCOUNTER — Encounter: Payer: Self-pay | Admitting: Pain Medicine

## 2019-03-23 ENCOUNTER — Encounter: Payer: Self-pay | Admitting: Pain Medicine

## 2019-03-25 ENCOUNTER — Ambulatory Visit: Payer: Medicare HMO | Admitting: Psychiatry

## 2019-03-30 ENCOUNTER — Ambulatory Visit (HOSPITAL_COMMUNITY): Payer: Medicare HMO | Admitting: Licensed Clinical Social Worker

## 2019-03-30 ENCOUNTER — Encounter: Payer: Self-pay | Admitting: Pain Medicine

## 2019-04-02 ENCOUNTER — Encounter: Payer: Self-pay | Admitting: Pain Medicine

## 2019-04-02 ENCOUNTER — Other Ambulatory Visit: Payer: Self-pay

## 2019-04-02 ENCOUNTER — Encounter: Payer: Self-pay | Admitting: Emergency Medicine

## 2019-04-02 ENCOUNTER — Emergency Department: Payer: Medicare HMO

## 2019-04-02 ENCOUNTER — Emergency Department
Admission: EM | Admit: 2019-04-02 | Discharge: 2019-04-02 | Disposition: A | Discharge status: Home / Self Care | Payer: Medicare HMO | Attending: Emergency Medicine | Admitting: Emergency Medicine

## 2019-04-02 DIAGNOSIS — N183 Chronic kidney disease, stage 3 unspecified: Secondary | ICD-10-CM | POA: Diagnosis not present

## 2019-04-02 DIAGNOSIS — E1122 Type 2 diabetes mellitus with diabetic chronic kidney disease: Secondary | ICD-10-CM | POA: Diagnosis not present

## 2019-04-02 DIAGNOSIS — Z8673 Personal history of transient ischemic attack (TIA), and cerebral infarction without residual deficits: Secondary | ICD-10-CM | POA: Diagnosis not present

## 2019-04-02 DIAGNOSIS — M79671 Pain in right foot: Secondary | ICD-10-CM | POA: Diagnosis not present

## 2019-04-02 DIAGNOSIS — E039 Hypothyroidism, unspecified: Secondary | ICD-10-CM | POA: Diagnosis not present

## 2019-04-02 DIAGNOSIS — M19071 Primary osteoarthritis, right ankle and foot: Secondary | ICD-10-CM | POA: Diagnosis not present

## 2019-04-02 DIAGNOSIS — Z7982 Long term (current) use of aspirin: Secondary | ICD-10-CM | POA: Insufficient documentation

## 2019-04-02 DIAGNOSIS — I130 Hypertensive heart and chronic kidney disease with heart failure and stage 1 through stage 4 chronic kidney disease, or unspecified chronic kidney disease: Secondary | ICD-10-CM | POA: Insufficient documentation

## 2019-04-02 DIAGNOSIS — Z79899 Other long term (current) drug therapy: Secondary | ICD-10-CM | POA: Insufficient documentation

## 2019-04-02 DIAGNOSIS — I252 Old myocardial infarction: Secondary | ICD-10-CM | POA: Diagnosis not present

## 2019-04-02 DIAGNOSIS — Z87891 Personal history of nicotine dependence: Secondary | ICD-10-CM | POA: Diagnosis not present

## 2019-04-02 DIAGNOSIS — I509 Heart failure, unspecified: Secondary | ICD-10-CM | POA: Insufficient documentation

## 2019-04-02 DIAGNOSIS — J449 Chronic obstructive pulmonary disease, unspecified: Secondary | ICD-10-CM | POA: Insufficient documentation

## 2019-04-02 MED ORDER — PREDNISONE 10 MG PO TABS: ORAL_TABLET | ORAL | 0 refills | Status: DC

## 2019-04-02 NOTE — Discharge Instructions (Addendum)
Follow-up with your primary care provider if any continued problems or concerns.  You may continue taking Tylenol and tramadol with the steroids.  Do not take over-the-counter medication such as Aleve, ibuprofen or Advil with the prednisone.  If not improving you may follow-up with Baker who is the podiatrist on call today.  His contact information is listed on your discharge papers.

## 2019-04-02 NOTE — ED Provider Notes (Signed)
[facility] Emergency Department Provider Note  ____________________________________________   First MD Initiated Contact with Patient 04/02/19 1100     (approximate)  I have reviewed the triage vital signs and the nursing notes.   HISTORY  Chief Complaint Foot Pain   HPI Marie Clarke is a 49 y.o. female presents to the ED with complaint of right foot pain.  Patient states pain started 3 days ago while she was sitting doing a puzzle.  There is been no history of injury.  Patient states that the pain is like a toothache that will not go away.  She has tried various over-the-counter medications and also a tramadol that she had at home which also gave no relief.  She denies any previous injury to her foot.  She rates her pain as 9 out of 10.       Past Medical History:  Diagnosis Date  . Anemia   . Anxiety   . Asthma   . Bipolar disorder ([place])   . CAD (coronary artery disease) unk  . CHF (congestive heart failure) ([place])   . COPD (chronic obstructive pulmonary disease) ([place])   . Depression unk  . Diabetes mellitus without complication ([place])   . Diabetes mellitus, type II ([place])   . Drug overdose   . GERD (gastroesophageal reflux disease)   . Headache   . Hyperlipidemia   . Hypertension   . Left leg pain 04/29/2014  . MI (myocardial infarction) ([place])   . Muscle ache 09/16/2014  . Osteoporosis   . Overactive bladder   . Pancreatitis unk  . Reflex sympathetic dystrophy   . Renal insufficiency   . Sleep apnea    pt reported on 2/6/7 she currently is not using CPAP  . Sleep apnea   . Stroke ([place])   . Thyroid disease   . TIA (transient ischemic attack) unk  . TIA (transient ischemic attack)     Patient Active Problem List   Diagnosis Date Noted  . Gout 01/21/2019  . Noncompliance with treatment regimen 12/29/2018  . BMI 40.0-44.9, adult ([place]) 11/19/2018  . AKI (acute kidney injury) ([place]) 11/17/2018  . Postural dizziness with presyncope 09/29/2018    . Bipolar I disorder, most recent episode mixed ([place]) 09/10/2018  . Panic attacks 09/10/2018  . Chronic lumbosacral L5-S1 IVD protrusion (Bilateral) 08/25/2018  . Lumbar lateral recess stenosis (L5-S1) (Bilateral) 08/25/2018  . Wound disruption 08/05/2018  . Osteoarthritis involving multiple joints 07/10/2018  . Long term current use of non-steroidal anti-inflammatories (NSAID) 07/10/2018  . NSAID induced gastritis 07/10/2018  . DDD (degenerative disc disease), lumbosacral 04/22/2018  . Disease related peripheral neuropathy 11/13/2017  . Gastroesophageal reflux disease without esophagitis 11/13/2017  . Occipital headache (Bilateral) 11/13/2017  . Cervicogenic headache (Bilateral) (L>R) 11/13/2017  . History of postoperative nausea 10/22/2017  . Chronic shoulder pain (Fifth Area of Pain) (Bilateral) (L>R) 10/09/2017  . CRPS (complex regional pain syndrome) type 1 of lower limb (left ankle) 10/09/2017  . Ankle joint instability (Left) 10/09/2017  . Ankle sprain, sequela (Left) 10/09/2017  . History of psychiatric symptoms 10/09/2017  . Chronic pain syndrome 10/02/2017  . Spondylosis without myelopathy or radiculopathy, lumbosacral region 10/02/2017  . Chronic musculoskeletal pain 10/02/2017  . Elevated C-reactive protein (CRP) 09/10/2017  . Elevated sed rate 09/10/2017  . Chronic neck pain (Fourth Area of Pain) (Bilateral) (L>R) 09/09/2017  . Pharmacologic therapy 09/09/2017  . Disorder of skeletal system 09/09/2017  . Problems influencing health status 09/09/2017  . Long  term current use of opiate analgesic 09/09/2017  . Tobacco use disorder 07/16/2017  . Ventral hernia without obstruction or gangrene 06/25/2017  . Strain of extensor muscle, fascia and tendon of left index finger at wrist and hand level, initial encounter 05/16/2017  . Sepsis ([place]) 04/14/2017  . Osteopenia 04/03/2017  . Hypotension 09/17/2016  . Contusion of knee (Left) 10/12/2015  . Strain of knee (Left)  10/12/2015  . Incidental lung nodule 04/28/2015  . Chronic low back pain (Primary Area of Pain) (Bilateral) (L>R) 03/28/2015  . Chronic lower extremity pain (Referred) (Secondary Area of Pain) (Left) 03/28/2015  . Abdominal wound dehiscence 03/28/2015  . Encounter for pain management planning 03/28/2015  . Morbid obesity ([place]) 03/28/2015  . Abnormal CT scan, lumbar spine 03/28/2015  . Lumbar facet hypertrophy 03/28/2015  . Lumbar facet syndrome (Bilateral) (L>R) 03/28/2015  . Lumbar foraminal stenosis (Bilateral) (L5-S1) 03/28/2015  . Chronic ankle pain [facility] Area of Pain) (Left) 03/28/2015  . Neurogenic pain 03/28/2015  . Neuropathic pain 03/28/2015  . Myofascial pain 03/28/2015  . History of suicide attempt 03/28/2015  . PTSD (post-traumatic stress disorder) 01/13/2015  . Abnormal gait 12/15/2014  . Congestive heart failure ([place]) 11/15/2014  . Abdominal wall abscess 09/20/2014  . 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 Diabetes mellitus, type 2 ([place]) 08/13/2014  . Bipolar affective disorder ([place]) 08/13/2014  . Type 2 diabetes mellitus ([place]) 08/13/2014  . Rectal prolapse 08/09/2014  . Rectal bleeding 08/09/2014  . Rectal bleed 08/09/2014  . Affective bipolar disorder ([place]) 08/05/2014  . Arteriosclerosis of coronary artery 08/05/2014  . CCF (congestive cardiac failure) ([place]) 08/05/2014  . Chronic kidney disease 08/05/2014  . 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 Apnea, sleep 08/05/2014  . Temporary cerebral vascular dysfunction 08/05/2014  . Polypharmacy 04/29/2014  . Other long term (current) drug therapy 04/29/2014  . 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 Chronic kidney disease, stage III (moderate) ([place]) 12/14/2013  . Controlled diabetes mellitus type II without complication ([place]) 99991111  . Essential (primary) hypertension 12/03/2013  . Adult hypothyroidism 12/03/2013  . Controlled type 2 diabetes mellitus without complication ([place]) 99991111    Past Surgical  History:  Procedure Laterality Date  . ABDOMINAL HYSTERECTOMY    . HERNIA REPAIR  07/15/2017   [facility]  . prolapse rectum surgery N/A July 2016  . TONSILLECTOMY      Prior to Admission medications   Medication Sig Start Date End Date Taking? Authorizing Provider  aspirin 81 MG chewable tablet Chew by mouth.    [provider]  atorvastatin (LIPITOR) 40 MG tablet Take 40 mg by mouth at bedtime.     [provider]  B Complex-C (B-COMPLEX WITH VITAMIN C) tablet Take by mouth.    [provider]  busPIRone (BUSPAR) 30 MG tablet Take 1 tablet (30 mg total) by mouth 2 (two) times daily. 12/11/18   Ursula Alert, MD  celecoxib (CELEBREX) 100 MG capsule Take 1 capsule (100 mg total) by mouth 2 (two) times daily. 01/21/19 07/20/19  Milinda Pointer, MD  Cholecalciferol (VITAMIN D3) 125 MCG (5000 UT) TABS Take by mouth.    [provider]  colchicine (COLCRYS) 0.6 MG tablet Colcrys 0.6 mg tablet    [provider]  dicyclomine (BENTYL) 10 MG capsule dicyclomine 10 mg capsule    [provider]  diphenoxylate-atropine (LOMOTIL) 2.5-0.025 MG tablet Take by mouth. 12/25/17   [provider]  Dulaglutide 0.75 MG/0.5ML SOPN Inject 0.75 mg into the skin every Saturday.  07/15/18  [provider]  HYDROcodone-acetaminophen (NORCO/VICODIN) 5-325 MG tablet hydrocodone 5 mg-acetaminophen 325 mg tablet    [provider]  hydrOXYzine (ATARAX/VISTARIL) 25 MG tablet Take 1 tablet (25 mg total) by mouth every 6 (six) hours as needed. 01/20/19   Ursula Alert, MD  ipratropium (ATROVENT) 0.03 % nasal spray  01/15/19   [provider]  KLOR-CON M20 20 MEQ tablet Take 0.5 tablets (10 mEq total) by mouth 2 (two) times daily. Patient taking differently: Take 20 mEq by mouth daily. But can take twice a day if needed 11/18/18   Mayo, Pete Pelt, MD  Lancets (ACCU-CHEK SAFE-T PRO) lancets  11/21/18   [provider]    levocetirizine (XYZAL) 5 MG tablet Take 5 mg by mouth daily.  06/04/18   [provider]  levothyroxine (SYNTHROID) 50 MCG tablet Take 50 mcg by mouth daily. (279mcg total daily) 09/03/18   [provider]  levothyroxine (SYNTHROID, LEVOTHROID) 200 MCG tablet Take 200 mcg by mouth daily. (222mcg total daily)    [provider]  Magnesium Oxide 500 MG CAPS Take 1 capsule (500 mg total) by mouth 2 (two) times daily at 8 am and 10 pm. 04/13/19 04/12/20  Milinda Pointer, MD  methocarbamol (ROBAXIN) 750 MG tablet Take 1 tablet (750 mg total) by mouth every 8 (eight) hours as needed for muscle spasms. 04/13/19 10/10/19  Milinda Pointer, MD  montelukast (SINGULAIR) 10 MG tablet Take 10 mg by mouth daily.     [provider]  nitroGLYCERIN (NITROSTAT) 0.4 MG SL tablet Place 0.4 mg under the tongue every 5 (five) minutes as needed for chest pain.     [provider]  Olopatadine HCl 0.2 % SOLN  01/01/19   [provider]  ondansetron (ZOFRAN-ODT) 8 MG disintegrating tablet Take 8 mg by mouth every 8 (eight) hours as needed for nausea or vomiting.    [provider]  oseltamivir (TAMIFLU) 75 MG capsule oseltamivir 75 mg capsule    [provider]  predniSONE (DELTASONE) 10 MG tablet Take 6 tablets  today, on day 2 take 5 tablets, day 3 take 4 tablets, day 4 take 3 tablets, day 5 take  2 tablets and 1 tablet the last day 04/02/19   [name], PA-C  pregabalin (LYRICA) 150 MG capsule Take 1 capsule (150 mg total) by mouth 2 (two) times daily. 04/13/19 10/10/19  Milinda Pointer, MD  prochlorperazine (COMPAZINE) 10 MG tablet prochlorperazine maleate 10 mg tablet    [provider]  QUEtiapine (SEROQUEL) 100 MG tablet Take 1-1.5 tablets (100-150 mg total) by mouth at bedtime as needed (sleep). 11/28/18   Ursula Alert, MD  rizatriptan (MAXALT) 10 MG tablet Take 1 tablet (10 mg total) by mouth as needed. 01/21/19 07/20/19  Milinda Pointer, MD  solifenacin (VESICARE) 5 MG tablet Take by mouth. 01/16/19 01/16/20  [provider]  torsemide (DEMADEX) 20 MG tablet Take 1 tablet (20 mg total) by mouth 2 (two) times daily. 11/18/18 12/18/18  Mayo, Pete Pelt, MD  traMADol (ULTRAM) 50 MG tablet Take 1-2 tablets (50-100 mg total) by mouth every 6 (six) hours. 04/13/19 10/10/19  Milinda Pointer, MD  TRELEGY ELLIPTA 100-62.5-25 MCG/INH AEPB Inhale 1 puff into the lungs daily.     [provider]  VENTOLIN HFA 108 (90 Base) MCG/ACT inhaler Inhale 1-2 puffs into the lungs every 6 (six) hours as needed for wheezing or shortness of breath.  05/03/18   [provider]  vitamin E 400  UNIT capsule vitamin e 400 iu synthetic    [provider]    Allergies Azithromycin, Diazepam, Levofloxacin, Lisinopril, Metronidazole, Sulfa antibiotics, Valproic acid, Ziprasidone hcl, Divalproex sodium, Cephalexin, Ciprofloxacin, and Penicillins  Family History  Problem Relation Age of Onset  . Diabetes Mellitus II Mother   . CAD Mother   . Sleep apnea Mother   . Osteoarthritis Mother   . Osteoporosis Mother   . Anxiety disorder Mother   . Depression Mother   . Bipolar disorder Mother   . Bipolar disorder Father   . Hypertension Father   . Depression Father   . Anxiety disorder Father   . Post-traumatic stress disorder Sister     Social History Social History   Tobacco Use  . Smoking status: Former Smoker    Packs/day: 0.50    Types: Cigarettes    Quit date: 09/01/2018    Years since quitting: 0.5  . Smokeless tobacco: Never Used  . Tobacco comment: Patient quit smoking 09/01/2018  Substance Use Topics  . Alcohol use: No    Alcohol/week: 0.0 standard drinks  . Drug use: No    Review of Systems Constitutional: No fever/chills Cardiovascular: Denies chest pain. Respiratory: Denies shortness of breath. Gastrointestinal: No abdominal pain.  No nausea, no vomiting.   Musculoskeletal: Positive for  right foot pain. Skin: Negative for rash. Neurological: Negative for headaches, focal weakness or numbness. ____________________________________________   PHYSICAL EXAM:  VITAL SIGNS: ED Triage Vitals  Enc Vitals Group     BP 04/02/19 1051 (!) 96/43     Pulse Rate 04/02/19 1051 (!) 102     Resp 04/02/19 1051 14     Temp 04/02/19 1051 98.6 F (37 C)     Temp Source 04/02/19 1051 Oral     SpO2 04/02/19 1051 100 %     Weight 04/02/19 1052 225 lb (102.1 kg)     Height 04/02/19 1052 5\' 4"  (1.626 m)     Head Circumference --      Peak Flow --      Pain Score 04/02/19 1052 9     Pain Loc --      Pain Edu? --      Excl. in [place]? --    Constitutional: Alert and oriented. Well appearing and in no acute distress. Eyes: Conjunctivae are normal.  Head: Atraumatic. Neck: No stridor.   Cardiovascular: Normal rate, regular rhythm. Grossly normal heart sounds.  Good peripheral circulation. Respiratory: Normal respiratory effort.  No retractions. Lungs CTAB. Musculoskeletal: Examination of right foot shows no gross deformity.  Patient is moderately tender on palpation of the distal fourth and fifth metatarsals.  Skin is intact.  There is no discoloration or abrasions.  Patient is able to move digits without any difficulty and motor sensory function intact.  Patient has multiple fungal nails.  No point tenderness noted on palpation of the ankle. Neurologic:  Normal speech and language. No gross focal neurologic deficits are appreciated.  Skin:  Skin is warm, dry and intact. No rash noted. Psychiatric: Mood and affect are normal. Speech and behavior are normal.  ____________________________________________   LABS (all labs ordered are listed, but only abnormal results are displayed)  Labs Reviewed - No data to display   RADIOLOGY   Official radiology report(s): DG Foot Complete Right  Result Date: 04/02/2019 CLINICAL DATA:  Pain for 4 days EXAM: RIGHT FOOT COMPLETE - 3+ VIEW COMPARISON:   None. FINDINGS: Frontal, oblique, and lateral views were obtained. There is no  fracture or dislocation. There is narrowing of the first MTP joint as well as the second, third, and fourth PIP and D IP joints. No erosive changes. There is a posterior calcaneal spur. There is an accessory ossicle lateral to the cuboid, an anatomic variant. IMPRESSION: Narrowing of several distal joints as well as the first MTP joint. No fracture or dislocation. There is a posterior calcaneal spur. Electronically Signed   By: Lowella Grip III M.D.   On: 04/02/2019 11:37    ____________________________________________   PROCEDURES  Procedure(s) performed (including Critical Care):  Procedures   ____________________________________________   INITIAL IMPRESSION / ASSESSMENT AND PLAN / ED COURSE  As part of my medical decision making, I reviewed the following data within the electronic MEDICAL RECORD NUMBER Notes from prior ED visits and  Controlled Substance Database  49 year old female presents to the ED with complaint of right foot pain without history of injury.  Patient states that this is been going on for 3 days and she has had no relief with over-the-counter medication and also tramadol which she had at home.  Patient denies any previous injury.  Physical exam was unremarkable with the exception of moderate tenderness on palpation of the distal fourth and fifth metatarsals.  X-rays were negative for acute bony injury.  Patient was placed on prednisone 6-day taper and told to avoid NSAIDs.  She can take Tylenol or her tramadol with the prednisone if needed.  She is to follow-up with Dr. Luana Shu who is the podiatrist on call if any continued problems with her foot. ____________________________________________   FINAL CLINICAL IMPRESSION(S) / ED DIAGNOSES  Final diagnoses:  Acute pain of right foot  Osteoarthritis of right foot, unspecified osteoarthritis type     ED Discharge Orders         Ordered     predniSONE (DELTASONE) 10 MG tablet     04/02/19 1152           Note:  This document was prepared using Dragon voice recognition software and may include unintentional dictation errors.    [name], PA-C 04/02/19 1304    Duffy Bruce, MD 04/03/19 1140

## 2019-04-02 NOTE — ED Triage Notes (Signed)
Says right foot pain 3 days. Hurts like a toothache.  Says no injury

## 2019-04-02 NOTE — ED Notes (Signed)
See triage note  Presents with right foot pain  States pain started 3 days ago w/o injury   States pain is across foot states she noticed some swelling   Good pulses

## 2019-04-03 NOTE — Progress Notes (Signed)
Patient is having issue with her lower back, S1, and she is scheduled to see Dr Lacinda Axon re; SCS placement on 04/07/19.  She states Tramadol is not working, she had to go to ED for this pain and wants to speak to you prior to seeing Dr Lacinda Axon.

## 2019-04-05 NOTE — Progress Notes (Signed)
Patient: Marie Clarke  Service Category: E/M  Provider: Gaspar Cola, MD  DOB: March 28, 1970  DOS: 04/06/2019  Location: Office  MRN: [id number]  Setting: Ambulatory outpatient  Referring Provider: Sharyne Peach, MD  Type: Established Patient  Specialty: Interventional Pain Management  PCP: Sharyne Peach, MD  Location: Remote location  Delivery: TeleHealth     Virtual Encounter - Pain Management PROVIDER NOTE: Information contained herein reflects review and annotations entered in association with encounter. Interpretation of such information and data should be left to medically-trained personnel. Information provided to patient can be located elsewhere in the medical record under "Patient Instructions". Document created using STT-dictation technology, any transcriptional errors that may result from process are unintentional.    Contact & Pharmacy Preferred: [phone] Home: [phone] (home) Mobile: [phone] (mobile) E-mail: [email or web address]  [place] [place] [facility] [place] [address] [place] [address] Phone: [phone] Fax: [phone]   Pre-screening  [name] offered "in-person" vs "virtual" encounter. She indicated preferring virtual for this encounter.   Reason COVID-19*  Social distancing based on CDC and AMA recommendations.   I contacted Marie Clarke on 04/06/2019 via telephone.      I clearly identified myself as Gaspar Cola, MD. I verified that I was speaking with the correct person using two identifiers (Name: Cordie Beazley, and date of birth: [birth year]).  Consent I sought verbal advanced consent from [facility] for virtual visit interactions. I informed [name] of possible security and privacy concerns, risks, and limitations associated with providing "not-in-person" medical evaluation and management services. I also informed [name] of the availability of "in-person" appointments. Finally, I informed her that there  would be a charge for the virtual visit and that she could be  personally, fully or partially, financially responsible for it. Ms. Rawles expressed understanding and agreed to proceed.   Historic Elements   Ms. Rhenda [place] is a 49 y.o. year old, female patient evaluated today after her last contact with our practice on 03/11/2019. Ms. Harbach  has a past medical history of Anemia, Anxiety, Asthma, Bipolar disorder ([place]), CAD (coronary artery disease) ([place]), CHF (congestive heart failure) ([place]), COPD (chronic obstructive pulmonary disease) ([place]), Depression ([place]), Diabetes mellitus without complication ([place]), Diabetes mellitus, type II ([place]), Drug overdose, GERD (gastroesophageal reflux disease), Headache, Hyperlipidemia, Hypertension, Left leg pain (04/29/2014), MI (myocardial infarction) ([place]), Muscle ache (09/16/2014), Osteoporosis, Overactive bladder, Pancreatitis ([place]), Reflex sympathetic dystrophy, Renal insufficiency, Sleep apnea, Sleep apnea, Stroke ([place]), Thyroid disease, TIA (transient ischemic attack) ([place]), and TIA (transient ischemic attack). She also  has a past surgical history that includes prolapse rectum surgery (N/A, July 2016); Tonsillectomy; Abdominal hysterectomy; and Hernia repair (07/15/2017). Ms. Novakowski has a current medication list which includes the following prescription(s): aspirin, atorvastatin, b-complex with vitamin c, buspirone, celecoxib, vitamin d3, colchicine, darifenacin, dicyclomine, diphenoxylate-atropine, dulaglutide, famotidine, hydrocodone-acetaminophen, hydroxyzine, ipratropium, klor-con m20, accu-chek safe-t pro, levocetirizine, levothyroxine, levothyroxine, [START ON 04/13/2019] magnesium oxide, [START ON 04/13/2019] methocarbamol, metoprolol succinate, montelukast, nitroglycerin, olopatadine hcl, omeprazole, ondansetron, oseltamivir, prednisone, [START ON 04/13/2019] pregabalin, prochlorperazine, promethazine, quetiapine, rizatriptan, ropinirole, solifenacin, topiramate, [START ON  04/13/2019] tramadol, trelegy ellipta, ventolin hfa, vitamin e, and torsemide. She  reports that she quit smoking about 7 months ago. Her smoking use included cigarettes. She smoked 0.50 packs per day. She has never used smokeless tobacco. She reports that she does not drink alcohol or use drugs. Ms. [place] is allergic to azithromycin; diazepam; levofloxacin; lisinopril; metronidazole; sulfa antibiotics; valproic acid; ziprasidone hcl; divalproex sodium;  cephalexin; ciprofloxacin; and penicillins.   HPI  Today, she is being contacted for worsening of previously known (established) problem.  Today I contacted the patient because she stated that she needed to talk to me before she could go want to do anything else.  I was under the impression that she had ready seen the neurosurgeon to do the spinal cord stimulator implant as it turns out, she has not.  She apparently postponed or canceled that appointment because she was having a flareup of her right lower extremity pain and she ended up in the emergency room where they took 1 x-ray of her foot and told her that she had an S1 radiculopathy.  As it turns out, this is not accurate.  First of all you cannot tell that somebody has a radiculopathy from an x-ray and much less from an x-ray of the foot.  However this was enough to totally confused the patient and had her stop all of her plans because she felt that this did not go with what we had told her we were working with.  As it turns out, she describes the event as a flareup of the lower extremity pain, bilaterally, with the right side being much worse than the left.  In the case of the right lower extremity pain, that pain went down the back of the leg all the way into the top of the foot and what seems to be an L5/S1 dermatomal distributions.  Looking at her prior MRI, it shows that she has severe foraminal stenosis affecting the L5, bilaterally with a bilateral, lateral recess stenosis at L5-S1 that is likely to  also be affecting the S1 nerve root.  However the symptoms that she is currently having and that predominate are those of an L5 radiculopathy on the right side.  In the case of the left lower extremity the pain runs down to the area of the knee where it stops and it runs through the back of the leg suggesting simply referred pain from the lower back.  We do know that this patient has a bilateral lumbar facet syndrome which we have treated with radiofrequency ablation, bilaterally, with the right side having been done on 04/22/2018, and the left side having been done on 03/13/2018.  By now it has been longer than a year on the left side and we are reaching the 1 year mark on the right side as well.  Interestingly, when we did the diagnostic lumbar facet blocks the patient attained 100% relief of the pain for the duration of the local anesthetic and 70% long-term benefit which she describes as being more profound in terms of the leg pain than the back pain.  On 09/04/2018 the patient had a left L5 transforaminal ESI + a right L4-5 LESI under fluoroscopic guidance and IV sedation.  This last procedure, although it gave the patient 100% relief of the pain for the duration of the local anesthetic, it did not last long and therefore went on to try and the spinal cord stimulator.  In any case, since the patient is having a flareup of the pain, and she does have a history of diabetes, I will try to avoid providing her with oral steroids which will significantly increase her blood sugar for a prolonged period of time and I will bring her in for a right-sided L5 transforaminal ESI + a right-sided L5-S1 LESI under fluoroscopic guidance and IV sedation.  This way, we will minimize the amount of steroids  needed to get this better.  With regards to the patient's question asked to whether or not the spinal cord stimulator will help with some of this pain, the answer would be yes, but in view of the fact that the patient has  such severe foraminal stenosis, we also need to consider having the surgeon decompressed that area to see if the use of the spinal cord stimulator is still needed after that.  It is my impression that if we go ahead and put a spinal cord stimulator and without decompressing the severe foraminal stenosis that she has, eventually this will order right the stimulation from the implantable device and we will back on square 1.  Pharmacotherapy Assessment  Analgesic: Tramadol 50 mg, 1-2 tab q 6 hrs (200-400 mg/day of tramadol) High-Risk due to history of a prior suicidal attempt w/ medications. MME/day:20-76m/day.   Monitoring: [place] PMP: PDMP reviewed during this encounter.       Pharmacotherapy: No side-effects or adverse reactions reported. Compliance: No problems identified. Effectiveness: Clinically acceptable. Plan: Refer to "POC".  UDS:  Summary  Date Value Ref Range Status  01/22/2018 FINAL  Final    Comment:    ==================================================================== TOXASSURE SELECT 13 (MW) ==================================================================== Test                             Result       Flag       Units Drug Present and Declared for Prescription Verification   Tramadol                       2742         EXPECTED   ng/mg creat   O-Desmethyltramadol            5630         EXPECTED   ng/mg creat   N-Desmethyltramadol            2591         EXPECTED   ng/mg creat    Source of tramadol is a prescription medication.    O-desmethyltramadol and N-desmethyltramadol are expected    metabolites of tramadol. ==================================================================== Test                      Result    Flag   Units      Ref Range   Creatinine              66               mg/dL      >=20 ==================================================================== Declared Medications:  The flagging and interpretation on this report are based on the   following declared medications.  Unexpected results may arise from  inaccuracies in the declared medications.  **Note: The testing scope of this panel includes these medications:  Tramadol  **Note: The testing scope of this panel does not include following  reported medications:  Acetaminophen  Aspirin (Aspirin 81)  Atorvastatin  Atropine (Lomotil)  Azelastine  Bumetanide  Buspirone  Diphenoxylate (Lomotil)  Fluticasone (Trelegy Ellipta)  Gabapentin  Hydroxyzine (Vistaril)  Lamotrigine  Levocetirizine (Xyzal)  Levothyroxine  Magnesium Oxide  Metformin  Methocarbamol  Mirabegron (Myrbetriq)  Montelukast  Nicotine  Omega-3 Fatty Acids  Ondansetron  Potassium  Potassium (Klor-Con)  Promethazine  Quetiapine  Rizatriptan  Umeclidinium (Trelegy Ellipta)  Vilanterol (Trelegy Ellipta) ==================================================================== For clinical consultation, please call [phone] ====================================================================  Laboratory Chemistry Profile   Renal Lab Results  Component Value Date   BUN 26 (H) 02/07/2019   CREATININE 1.27 (H) 02/07/2019   BCR 16 09/09/2017   GFRAA 58 (L) 02/07/2019   GFRNONAA 50 (L) 02/07/2019    Hepatic Lab Results  Component Value Date   AST 22 02/07/2019   ALT 18 02/07/2019   ALBUMIN 4.5 02/07/2019   ALKPHOS 107 02/07/2019   LIPASE 39 11/17/2018    Electrolytes Lab Results  Component Value Date   NA 138 02/07/2019   K 3.8 02/07/2019   CL 97 (L) 02/07/2019   CALCIUM 9.0 02/07/2019   MG 1.9 09/09/2017    Bone Lab Results  Component Value Date   25OHVITD1 43 09/09/2017   25OHVITD2 <1.0 09/09/2017   25OHVITD3 43 09/09/2017    Inflammation (CRP: Acute Phase) (ESR: Chronic Phase) Lab Results  Component Value Date   CRP 21 (H) 09/09/2017   ESRSEDRATE 50 (H) 09/09/2017   LATICACIDVEN 1.6 01/11/2019      Note: Above Lab results reviewed.  Imaging  DG Foot Complete  Right CLINICAL DATA:  Pain for 4 days  EXAM: RIGHT FOOT COMPLETE - 3+ VIEW  COMPARISON:  None.  FINDINGS: Frontal, oblique, and lateral views were obtained. There is no fracture or dislocation. There is narrowing of the first MTP joint as well as the second, third, and fourth PIP and D IP joints. No erosive changes. There is a posterior calcaneal spur. There is an accessory ossicle lateral to the cuboid, an anatomic variant.  IMPRESSION: Narrowing of several distal joints as well as the first MTP joint. No fracture or dislocation. There is a posterior calcaneal spur.  Electronically Signed   By: Lowella Grip III M.D.   On: 04/02/2019 11:37  Assessment  The primary encounter diagnosis was Chronic pain syndrome. Diagnoses of Chronic low back pain (Primary Area of Pain) (Bilateral) (L>R), Chronic lower extremity pain (Referred) (Secondary Area of Pain) (Left), Other intervertebral disc degeneration, lumbar region, Chronic lumbosacral L5-S1 IVD protrusion (Bilateral), DDD (degenerative disc disease), lumbosacral, Lumbar foraminal stenosis (Bilateral) (L5-S1), and Lumbar lateral recess stenosis (L5-S1) (Bilateral) were also pertinent to this visit.  Plan of Care  Problem-specific:  No problem-specific Assessment & Plan notes found for this encounter.  Ms. Rukia Mcgillivray has a current medication list which includes the following long-term medication(s): atorvastatin, celecoxib, colchicine, klor-con m20, levothyroxine, levothyroxine, [START ON 04/13/2019] magnesium oxide, [START ON 04/13/2019] methocarbamol, montelukast, nitroglycerin, [START ON 04/13/2019] pregabalin, quetiapine, rizatriptan, ropinirole, [START ON 04/13/2019] tramadol, and torsemide.  Pharmacotherapy (Medications Ordered): No orders of the defined types were placed in this encounter.  Orders:  Orders Placed This Encounter  Procedures  . Lumbar Epidural Injection    Standing Status:   Future    Standing Expiration Date:    05/04/2019    Scheduling Instructions:     Procedure: Interlaminar Lumbar Epidural Steroid injection (LESI)  L5-S1     Laterality: Right-sided     Sedation: Patient's choice.     Timeframe: ASAP    Order Specific Question:   Where will this procedure be performed?    Answer:   [facility]  . Lumbar Transforaminal Epidural    Standing Status:   Future    Standing Expiration Date:   05/04/2019    Scheduling Instructions:     Side: Right-sided     Level: L5     Sedation: Patient's choice.     Timeframe: ASAP    Order Specific Question:  Where will this procedure be performed?    Answer:   [facility]  . MR LUMBAR SPINE WO CONTRAST    Patient presents with axial pain with possible radicular component.  In addition to any acute findings, please report on:  1. Facet (Zygapophyseal) joint DJD (Hypertrophy, space narrowing, subchondral sclerosis, and/or osteophyte formation) 2. DDD and/or IVDD (Loss of disc height, desiccation or "Black disc disease") 3. Pars defects 4. Spondylolisthesis, spondylosis, and/or spondyloarthropathies (include Degree/Grade of displacement in mm) 5. Vertebral body Fractures, including age (old, new/acute) 83. Modic Type Changes 7. Demineralization 8. Bone pathology 9. Central, Lateral Recess, and/or Foraminal Stenosis (include AP diameter of stenosis in mm) 10. Surgical changes (hardware type, status, and presence of fibrosis)  NOTE: Please specify level(s) and laterality.    Standing Status:   Future    Standing Expiration Date:   07/04/2019    Order Specific Question:   What is the patient's sedation requirement?    Answer:   No Sedation    Order Specific Question:   Does the patient have a pacemaker or implanted devices?    Answer:   No    Order Specific Question:   Preferred imaging location?    Answer:   [facility] (table limit-350lbs)    Order Specific Question:   Call Results- Best Contact Number?    Answer:   [phone]  [phone] ([facility])    Order Specific Question:   Radiology Contrast Protocol - do NOT remove file path    Answer:   [email or web address]   Follow-up plan:   Return for Procedure (w/ sedation): (R) L5 TFESI + (R) L5-S1 LESI, (ASAP).      Interventional management options:  Considering:   NOTE: The patient is medical psychology evaluation indicates that the patient is a "suitable candidate" for a spinal cord stimulator trial. Possible lumbar spinal cord stimulator trial Diagnosticbilateral lumbar facet nerve block Possible bilateral lumbar facetRFA Diagnostic left-sided L5-S1 transforaminal ESI Diagnostic left-sided L5 selective nerve root block Possible left-sided L5 DRG RFA Diagnostic left-sidedlumbarsympathetic nerve block Possible left-sided lumbar sympathetic RFA Diagnostic bilateral cervical facet nerve block Possible bilateral cervical facetRFA Diagnosticleft intra-articular shoulder injection Diagnosticleft suprascapular nerve block Diagnostic left suprascapularRFA   Palliative PRN treatment(s):   Diagnostic left L5 TFESI #2 + right L4-5 LESI #2  Palliative bilateral lumbar facet blocks Palliative right-sided lumbar facet RFA #2 (last done 04/22/2018) Palliative left-sided lumbar facet RFA #2 (last done 03/13/2018)     Recent Visits Date Type Provider Dept  03/10/19 Procedure visit Milinda Pointer, MD [facility]  03/03/19 Procedure visit Milinda Pointer, MD [facility]  01/21/19 Telemedicine Milinda Pointer, MD [facility]  Showing recent visits within past 90 days and meeting all other requirements   Today's Visits Date Type Provider Dept  04/06/19 Telemedicine Milinda Pointer, MD [facility]  Showing today's visits and meeting all other requirements   Future Appointments No visits were found meeting these conditions.  Showing future appointments within next 90  days and meeting all other requirements   I discussed the assessment and treatment plan with the patient. The patient was provided an opportunity to ask questions and all were answered. The patient agreed with the plan and demonstrated an understanding of the instructions.  Patient advised to call back or seek an in-person evaluation if the symptoms or condition worsens.  Duration of encounter: 30 minutes.  Note by: Gaspar Cola, MD Date: 04/06/2019; Time: 1:28 PM

## 2019-04-06 ENCOUNTER — Ambulatory Visit: Payer: Medicare HMO | Attending: Pain Medicine | Admitting: Pain Medicine

## 2019-04-06 ENCOUNTER — Other Ambulatory Visit: Payer: Self-pay

## 2019-04-06 DIAGNOSIS — M545 Low back pain, unspecified: Secondary | ICD-10-CM

## 2019-04-06 DIAGNOSIS — M79605 Pain in left leg: Secondary | ICD-10-CM

## 2019-04-06 DIAGNOSIS — M5127 Other intervertebral disc displacement, lumbosacral region: Secondary | ICD-10-CM

## 2019-04-06 DIAGNOSIS — M5136 Other intervertebral disc degeneration, lumbar region: Secondary | ICD-10-CM

## 2019-04-06 DIAGNOSIS — M48061 Spinal stenosis, lumbar region without neurogenic claudication: Secondary | ICD-10-CM

## 2019-04-06 DIAGNOSIS — G894 Chronic pain syndrome: Secondary | ICD-10-CM

## 2019-04-06 DIAGNOSIS — M5137 Other intervertebral disc degeneration, lumbosacral region: Secondary | ICD-10-CM

## 2019-04-06 DIAGNOSIS — G8929 Other chronic pain: Secondary | ICD-10-CM

## 2019-04-06 NOTE — Patient Instructions (Signed)

## 2019-04-07 ENCOUNTER — Telehealth: Payer: Self-pay | Admitting: Pain Medicine

## 2019-04-07 ENCOUNTER — Encounter: Payer: Self-pay | Admitting: Pain Medicine

## 2019-04-07 ENCOUNTER — Other Ambulatory Visit: Payer: Self-pay | Admitting: Pain Medicine

## 2019-04-07 NOTE — Telephone Encounter (Signed)
Patient called stating she is in a lot of pain and she needs some help with getting this pain under control. Wants a phone call to discuss this

## 2019-04-09 ENCOUNTER — Encounter: Payer: Self-pay | Admitting: Pain Medicine

## 2019-04-09 ENCOUNTER — Ambulatory Visit
Admission: RE | Admit: 2019-04-09 | Discharge: 2019-04-09 | Disposition: A | Discharge status: Home / Self Care | Payer: Medicare HMO | Source: Ambulatory Visit | Attending: Pain Medicine | Admitting: Pain Medicine

## 2019-04-09 ENCOUNTER — Ambulatory Visit (HOSPITAL_BASED_OUTPATIENT_CLINIC_OR_DEPARTMENT_OTHER): Payer: Medicare HMO | Admitting: Pain Medicine

## 2019-04-09 VITALS — BP 129/80 | HR 109 | Temp 97.3°F | Resp 21 | Ht 64.0 in | Wt 225.0 lb

## 2019-04-09 DIAGNOSIS — M79605 Pain in left leg: Secondary | ICD-10-CM

## 2019-04-09 DIAGNOSIS — G8929 Other chronic pain: Secondary | ICD-10-CM | POA: Diagnosis present

## 2019-04-09 DIAGNOSIS — M5137 Other intervertebral disc degeneration, lumbosacral region: Secondary | ICD-10-CM | POA: Insufficient documentation

## 2019-04-09 DIAGNOSIS — M5417 Radiculopathy, lumbosacral region: Secondary | ICD-10-CM | POA: Insufficient documentation

## 2019-04-09 DIAGNOSIS — M5127 Other intervertebral disc displacement, lumbosacral region: Secondary | ICD-10-CM | POA: Diagnosis present

## 2019-04-09 DIAGNOSIS — M5136 Other intervertebral disc degeneration, lumbar region: Secondary | ICD-10-CM | POA: Insufficient documentation

## 2019-04-09 DIAGNOSIS — M48061 Spinal stenosis, lumbar region without neurogenic claudication: Secondary | ICD-10-CM | POA: Diagnosis present

## 2019-04-09 MED ORDER — FENTANYL CITRATE (PF) 100 MCG/2ML IJ SOLN
25.0000 ug | INTRAMUSCULAR | Status: DC | PRN
  Administered 2019-04-09: 50 ug via INTRAVENOUS
  Filled 2019-04-09: qty 2

## 2019-04-09 MED ORDER — ROPIVACAINE HCL 2 MG/ML IJ SOLN
2.0000 mL | Freq: Once | INTRAMUSCULAR | Status: AC
  Administered 2019-04-09: 2 mL via EPIDURAL

## 2019-04-09 MED ORDER — SODIUM CHLORIDE 0.9% FLUSH
2.0000 mL | Freq: Once | INTRAVENOUS | Status: AC
  Administered 2019-04-09: 11:00:00 2 mL

## 2019-04-09 MED ORDER — LIDOCAINE HCL 2 % IJ SOLN
20.0000 mL | Freq: Once | INTRAMUSCULAR | Status: AC
  Administered 2019-04-09: 400 mg
  Filled 2019-04-09: qty 40

## 2019-04-09 MED ORDER — LACTATED RINGERS IV SOLN
1000.0000 mL | Freq: Once | INTRAVENOUS | Status: AC
  Administered 2019-04-09: 1000 mL via INTRAVENOUS

## 2019-04-09 MED ORDER — IOHEXOL 180 MG/ML  SOLN
10.0000 mL | Freq: Once | INTRAMUSCULAR | Status: AC
  Administered 2019-04-09: 10 mL via EPIDURAL
  Filled 2019-04-09: qty 20

## 2019-04-09 MED ORDER — SODIUM CHLORIDE (PF) 0.9 % IJ SOLN
INTRAMUSCULAR | Status: AC
  Filled 2019-04-09: qty 10

## 2019-04-09 MED ORDER — DEXAMETHASONE SODIUM PHOSPHATE 10 MG/ML IJ SOLN
10.0000 mg | Freq: Once | INTRAMUSCULAR | Status: AC
  Administered 2019-04-09: 10 mg
  Filled 2019-04-09: qty 1

## 2019-04-09 MED ORDER — TRIAMCINOLONE ACETONIDE 40 MG/ML IJ SUSP
40.0000 mg | Freq: Once | INTRAMUSCULAR | Status: AC
  Administered 2019-04-09: 40 mg
  Filled 2019-04-09: qty 1

## 2019-04-09 MED ORDER — SODIUM CHLORIDE 0.9% FLUSH
1.0000 mL | Freq: Once | INTRAVENOUS | Status: AC
  Administered 2019-04-09: 1 mL

## 2019-04-09 MED ORDER — ROPIVACAINE HCL 2 MG/ML IJ SOLN
1.0000 mL | Freq: Once | INTRAMUSCULAR | Status: AC
  Administered 2019-04-09: 1 mL via EPIDURAL
  Filled 2019-04-09: qty 10

## 2019-04-09 MED ORDER — MIDAZOLAM HCL 5 MG/5ML IJ SOLN
1.0000 mg | INTRAMUSCULAR | Status: DC | PRN
  Administered 2019-04-09: 11:00:00 1 mg via INTRAVENOUS
  Filled 2019-04-09: qty 5

## 2019-04-09 NOTE — Progress Notes (Signed)
PROVIDER NOTE: Information contained herein reflects review and annotations entered in association with encounter. Interpretation of such information and data should be left to medically-trained personnel. Information provided to patient can be located elsewhere in the medical record under "Patient Instructions". Document created using STT-dictation technology, any transcriptional errors that may result from process are unintentional.    Patient: Marie Clarke  Service Category: Procedure  Provider: Gaspar Cola, MD  DOB: Dec 08, 1970  DOS: 04/09/2019  Location: [facility]  MRN: [id number]  Setting: Ambulatory - outpatient  Referring Provider: Milinda Pointer, MD  Type: Established Patient  Specialty: Interventional Pain Management  PCP: Sharyne Peach, MD   Primary Reason for Visit: Interventional Pain Management Treatment. CC: Back Pain (lower)  Procedure #1:  Anesthesia, Analgesia, Anxiolysis:  Type: Diagnostic Trans-Foraminal Epidural Steroid Injection   #1  Region: Lumbar Level: L5 Paravertebral Laterality: Right Paravertebral   Type: Moderate (Conscious) Sedation combined with Local Anesthesia Indication(s): Analgesia and Anxiety Route: Intravenous (IV) IV Access: Secured Sedation: Meaningful verbal contact was maintained at all times during the procedure  Local Anesthetic: Lidocaine 1-2%  Position: Prone  Procedure #2:    Type: Diagnostic Inter-Laminar Epidural Steroid Injection   #1  Region: Lumbar Level: L5-S1 Level. Laterality: Right Paramedial     Indications: 1. Lumbosacral radiculopathy at L5 (Right)   2. Lumbar foraminal stenosis (Bilateral) (L5-S1)   3. Chronic lumbosacral L5-S1 IVD protrusion (Bilateral)   4. Lumbar lateral recess stenosis (L5-S1) (Bilateral)   5. DDD (degenerative disc disease), lumbosacral   6. Chronic lower extremity pain (Referred) (Secondary Area of Pain) (Left)   7. Other intervertebral disc degeneration, lumbar region    8. Protrusion of intervertebral disc of lumbosacral region   9. Lumbar foraminal stenosis   10. Bilateral stenosis of lateral recess of lumbar spine   11. Lumbosacral radiculopathy at L5    Pain Score: Pre-procedure: 9 /10 Post-procedure: 0-No pain/10   Note: She describes the event as a flareup of the lower extremity pain, bilaterally (Bilateral), with the right side being much worse than the left (R>L).  In the case of the right lower extremity pain, that pain went down the back of the leg all the way into the top of the foot and what seems to be an L5/S1 dermatomal distributions.  Looking at her prior MRI, it shows that she has severe foraminal stenosis affecting the L5, bilaterally with a bilateral, lateral recess stenosis at L5-S1 that is likely to also be affecting the S1 nerve root.  However the symptoms that she is currently having and that predominate are those of an L5 radiculopathy on the right side.  In the case of the left lower extremity the pain runs down to the area of the knee where it stops and it runs through the back of the leg suggesting simply referred pain from the lower back.  We do know that this patient has a bilateral lumbar facet syndrome which we have treated with radiofrequency ablation, bilaterally, with the right side having been done on 04/22/2018, and the left side having been done on 03/13/2018.  By now it has been longer than a year on the left side and we are reaching the 1 year mark on the right side as well.  Interestingly, when we did the diagnostic lumbar facet blocks the patient attained 100% relief of the pain for the duration of the local anesthetic and 70% long-term benefit which she describes as being more profound in terms of the  leg pain than the back pain.   Pre-op Assessment:  Marie Clarke is a 49 y.o. (year old), female patient, seen today for interventional treatment. She  has a past surgical history that includes prolapse rectum surgery (N/A, July 2016);  Tonsillectomy; Abdominal hysterectomy; and Hernia repair (07/15/2017). Ms. Salo has a current medication list which includes the following prescription(s): aspirin, atorvastatin, b-complex with vitamin c, buspirone, celecoxib, vitamin d3, colchicine, darifenacin, dicyclomine, diphenoxylate-atropine, dulaglutide, famotidine, hydrocodone-acetaminophen, hydroxyzine, ipratropium, klor-con m20, accu-chek safe-t pro, levocetirizine, levothyroxine, levothyroxine, [START ON 04/13/2019] magnesium oxide, [START ON 04/13/2019] methocarbamol, metoprolol succinate, montelukast, nitroglycerin, olopatadine hcl, omeprazole, ondansetron, oseltamivir, [START ON 04/13/2019] pregabalin, prochlorperazine, promethazine, quetiapine, rizatriptan, ropinirole, solifenacin, topiramate, [START ON 04/13/2019] tramadol, trelegy ellipta, ventolin hfa, vitamin e, prednisone, and torsemide, and the following Facility-Administered Medications: fentanyl and midazolam. Her primarily concern today is the Back Pain (lower)  Initial Vital Signs:  Pulse/HCG Rate: (!) 109ECG Heart Rate: 99 Temp: 98.3 F (36.8 C) Resp: 18 BP: 103/90 SpO2: 98 %  BMI: Estimated body mass index is 38.62 kg/m as calculated from the following:   Height as of this encounter: 5\' 4"  (1.626 m).   Weight as of this encounter: 225 lb (102.1 kg).  Risk Assessment: Allergies: Reviewed. She is allergic to azithromycin; diazepam; levofloxacin; lisinopril; metronidazole; sulfa antibiotics; valproic acid; ziprasidone hcl; divalproex sodium; cephalexin; ciprofloxacin; and penicillins.  Allergy Precautions: None required Coagulopathies: Reviewed. None identified.  Blood-thinner therapy: None at this time Active Infection(s): Reviewed. None identified. Ms. Buhman is afebrile  Site Confirmation: Ms. Fasnacht was asked to confirm the procedure and laterality before marking the site Procedure checklist: Completed Consent: Before the procedure and under the influence of no sedative(s),  amnesic(s), or anxiolytics, the patient was informed of the treatment options, risks and possible complications. To fulfill our ethical and legal obligations, as recommended by the American Medical Association's Code of Ethics, I have informed the patient of my clinical impression; the nature and purpose of the treatment or procedure; the risks, benefits, and possible complications of the intervention; the alternatives, including doing nothing; the risk(s) and benefit(s) of the alternative treatment(s) or procedure(s); and the risk(s) and benefit(s) of doing nothing. The patient was provided information about the general risks and possible complications associated with the procedure. These may include, but are not limited to: failure to achieve desired goals, infection, bleeding, organ or nerve damage, allergic reactions, paralysis, and death. In addition, the patient was informed of those risks and complications associated to Spine-related procedures, such as failure to decrease pain; infection (i.e.: Meningitis, epidural or intraspinal abscess); bleeding (i.e.: epidural hematoma, subarachnoid hemorrhage, or any other type of intraspinal or peri-dural bleeding); organ or nerve damage (i.e.: Any type of peripheral nerve, nerve root, or spinal cord injury) with subsequent damage to sensory, motor, and/or autonomic systems, resulting in permanent pain, numbness, and/or weakness of one or several areas of the body; allergic reactions; (i.e.: anaphylactic reaction); and/or death. Furthermore, the patient was informed of those risks and complications associated with the medications. These include, but are not limited to: allergic reactions (i.e.: anaphylactic or anaphylactoid reaction(s)); adrenal axis suppression; blood sugar elevation that in diabetics may result in ketoacidosis or comma; water retention that in patients with history of congestive heart failure may result in shortness of breath, pulmonary edema, and  decompensation with resultant heart failure; weight gain; swelling or edema; medication-induced neural toxicity; particulate matter embolism and blood vessel occlusion with resultant organ, and/or nervous system infarction; and/or aseptic necrosis of one or more  joints. Finally, the patient was informed that Medicine is not an exact science; therefore, there is also the possibility of unforeseen or unpredictable risks and/or possible complications that may result in a catastrophic outcome. The patient indicated having understood very clearly. We have given the patient no guarantees and we have made no promises. Enough time was given to the patient to ask questions, all of which were answered to the patient's satisfaction. Ms. Zeman has indicated that she wanted to continue with the procedure. Attestation: I, the ordering provider, attest that I have discussed with the patient the benefits, risks, side-effects, alternatives, likelihood of achieving goals, and potential problems during recovery for the procedure that I have provided informed consent. Date  Time: 04/09/2019 10:28 AM  Pre-Procedure Preparation:  Monitoring: As per clinic protocol. Respiration, ETCO2, SpO2, BP, heart rate and rhythm monitor placed and checked for adequate function Safety Precautions: Patient was assessed for positional comfort and pressure points before starting the procedure. Time-out: I initiated and conducted the "Time-out" before starting the procedure, as per protocol. The patient was asked to participate by confirming the accuracy of the "Time Out" information. Verification of the correct person, site, and procedure were performed and confirmed by me, the nursing staff, and the patient. "Time-out" conducted as per Joint Commission's Universal Protocol (UP.01.01.01). Time: 1120  Description of Procedure #1:  Target Area: The inferior and lateral portion of the pedicle, just lateral to a line created by the 6:00 position  of the pedicle and the superior articular process of the vertebral body below. On the lateral view, this target lies just posterior to the anterior aspect of the lamina and posterior to the midpoint created between the anterior and the posterior aspect of the neural foramina. Approach: Posterior paravertebral approach. Area Prepped: Entire Posterior Lumbosacral Region Prepping solution: DuraPrep (Iodine Povacrylex [0.7% available iodine] and Isopropyl Alcohol, 74% w/w) Safety Precautions: Aspiration looking for blood return was conducted prior to all injections. At no point did we inject any substances, as a needle was being advanced. No attempts were made at seeking any paresthesias. Safe injection practices and needle disposal techniques used. Medications properly checked for expiration dates. SDV (single dose vial) medications used.  Description of the Procedure: Protocol guidelines were followed. The patient was placed in position over the fluoroscopy table. The target area was identified and the area prepped in the usual manner. Skin & deeper tissues infiltrated with local anesthetic. Appropriate amount of time allowed to pass for local anesthetics to take effect. The procedure needles were then advanced to the target area. Proper needle placement secured. Negative aspiration confirmed. Solution injected in intermittent fashion, asking for systemic symptoms every 0.2cc of injectate. The needles were then removed and the area cleansed, making sure to leave some of the prepping solution back to take advantage of its long term bactericidal properties.  Start Time: 1120 hrs.  Materials:  Needle(s) Type: Spinal Needle Gauge: 22G Length: 3.5-in Medication(s): Please see orders for medications and dosing details.  Description of Procedure #2:  Target Area: The  interlaminar space, initially targeting the lower border of the superior vertebral body lamina. Approach: Posterior paramedial  approach. Area Prepped: Same as above Prepping solution: Same as above Safety Precautions: Same as above  Description of the Procedure: Protocol guidelines were followed. The patient was placed in position over the fluoroscopy table. The target area was identified and the area prepped in the usual manner. Skin & deeper tissues infiltrated with local anesthetic. Appropriate amount of time  allowed to pass for local anesthetics to take effect. The procedure needle was introduced through the skin, ipsilateral to the reported pain, and advanced to the target area. Bone was contacted and the needle walked caudad, until the lamina was cleared. The ligamentum flavum was engaged and loss-of-resistance technique used as the epidural needle was advanced. The epidural space was identified using "loss-of-resistance technique" with 2-3 ml of PF-NaCl (0.9% NSS), in a 5cc LOR glass syringe. Proper needle placement secured. Negative aspiration confirmed. Solution injected in intermittent fashion, asking for systemic symptoms every 0.5cc of injectate. The needles were then removed and the area cleansed, making sure to leave some of the prepping solution back to take advantage of its long term bactericidal properties.  Vitals:   04/09/19 1140 04/09/19 1150 04/09/19 1200 04/09/19 1211  BP: 130/74 131/74 132/76 129/80  Pulse:      Resp: 12 16 (!) 22 (!) 21  Temp:  98 F (36.7 C)  (!) 97.3 F (36.3 C)  SpO2: 100% 98% 98% 99%  Weight:      Height:        End Time: 1138 hrs.  Materials:  Needle(s) Type: Epidural needle Gauge: 17G Length: 3.5-in Medication(s): Please see orders for medications and dosing details.  Imaging Guidance (Spinal):          Type of Imaging Technique: Fluoroscopy Guidance (Spinal) Indication(s): Assistance in needle guidance and placement for procedures requiring needle placement in or near specific anatomical locations not easily accessible without such assistance. Exposure Time:  Please see nurses notes. Contrast: Before injecting any contrast, we confirmed that the patient did not have an allergy to iodine, shellfish, or radiological contrast. Once satisfactory needle placement was completed at the desired level, radiological contrast was injected. Contrast injected under live fluoroscopy. No contrast complications. See chart for type and volume of contrast used. Fluoroscopic Guidance: I was personally present during the use of fluoroscopy. "Tunnel Vision Technique" used to obtain the best possible view of the target area. Parallax error corrected before commencing the procedure. "Direction-depth-direction" technique used to introduce the needle under continuous pulsed fluoroscopy. Once target was reached, antero-posterior, oblique, and lateral fluoroscopic projection used confirm needle placement in all planes. Images permanently stored in EMR. Interpretation: I personally interpreted the imaging intraoperatively. Adequate needle placement confirmed in multiple planes. Appropriate spread of contrast into desired area was observed. No evidence of afferent or efferent intravascular uptake. No intrathecal or subarachnoid spread observed. Permanent images saved into the patient's record.  Antibiotic Prophylaxis:   Anti-infectives (From admission, onward)   None     Indication(s): None identified  Post-operative Assessment:  Post-procedure Vital Signs:  Pulse/HCG Rate: (!) 10998 Temp: (!) 97.3 F (36.3 C) Resp: (!) 21 BP: 129/80 SpO2: 99 %  EBL: None  Complications: No immediate post-treatment complications observed by team, or reported by patient.  Note: The patient tolerated the entire procedure well. A repeat set of vitals were taken after the procedure and the patient was kept under observation following institutional policy, for this type of procedure. Post-procedural neurological assessment was performed, showing return to baseline, prior to discharge. The  patient was provided with post-procedure discharge instructions, including a section on how to identify potential problems. Should any problems arise concerning this procedure, the patient was given instructions to immediately contact us, at any time, without hesitation. In any case, we plan to contact the patient by telephone for a follow-up status report regarding this interventional procedure.  Comments:  No additional relevant information.  Plan of Care  Orders:  Orders Placed This Encounter  Procedures  . Lumbar Transforaminal Epidural    Scheduling Instructions:     Side: Right-sided     Level: L5     Sedation: With Sedation.     Timeframe: Today    Order Specific Question:   Where will this procedure be performed?    Answer:   [facility]  . Lumbar Epidural Injection    Scheduling Instructions:     Procedure: Interlaminar LESI L5-S1     Laterality: Right-sided     Sedation: Patient's choice     Timeframe:  Today    Order Specific Question:   Where will this procedure be performed?    Answer:   [facility]  . DG PAIN [facility] C-ARM 1-60 MIN NO REPORT    Intraoperative interpretation by procedural physician at [place].    Standing Status:   Standing    Number of Occurrences:   1    Order Specific Question:   Reason for exam:    Answer:   Assistance in needle guidance and placement for procedures requiring needle placement in or near specific anatomical locations not easily accessible without such assistance.  . Informed Consent Details: Physician/Practitioner Attestation; Transcribe to consent form and obtain patient signature    Provider Attestation: I, [place] Dossie Arbour, MD, (Pain Management Specialist), the physician/practitioner, attest that I have discussed with the patient the benefits, risks, side effects, alternatives, likelihood of achieving goals and potential problems during recovery for the procedure that I have provided informed  consent.    Scheduling Instructions:     Procedure: Diagnostic lumbar transforaminal epidural steroid injection under fluoroscopic guidance. (See notes for level and laterality.)     Indication/Reason: Lumbar radiculopathy/radiculitis associated with lumbar stenosis     Note: Always confirm laterality of pain with Ms. [place], before procedure.     Transcribe to consent form and obtain patient signature.  . Provide equipment / supplies at bedside    Equipment required: Single use, disposable, "Epidural Tray" Epidural Catheter: NOT required    Standing Status:   Standing    Number of Occurrences:   1    Order Specific Question:   Specify    Answer:   Epidural Tray  . Informed Consent Details: Physician/Practitioner Attestation; Transcribe to consent form and obtain patient signature    Provider Attestation: I, [place] Dossie Arbour, MD, (Pain Management Specialist), the physician/practitioner, attest that I have discussed with the patient the benefits, risks, side effects, alternatives, likelihood of achieving goals and potential problems during recovery for the procedure that I have provided informed consent.    Scheduling Instructions:     Procedure: Lumbar epidural steroid injection under fluoroscopic guidance     Indications: Low back and/or lower extremity pain secondary to lumbar radiculitis     Note: Always confirm laterality of pain with Ms. [place], before procedure.     Transcribe to consent form and obtain patient signature.   Chronic Opioid Analgesic:  Tramadol 50 mg, 1-2 tab q 6 hrs (200-400 mg/day of tramadol) High-Risk due to history of a prior suicidal attempt w/ medications. MME/day:20-40mg /day.   Medications ordered for procedure: Meds ordered this encounter  Medications  . iohexol (OMNIPAQUE) 180 MG/ML injection 10 mL    Must be Myelogram-compatible. If not available, you may substitute with a water-soluble, non-ionic, hypoallergenic, myelogram-compatible radiological  contrast medium.  Marland Kitchen lidocaine (XYLOCAINE) 2 % (with pres) injection 400 mg  . lactated  ringers infusion 1,000 mL  . midazolam (VERSED) 5 MG/5ML injection 1-2 mg    Make sure Flumazenil is available in the pyxis when using this medication. If oversedation occurs, administer 0.2 mg IV over 15 sec. If after 45 sec no response, administer 0.2 mg again over 1 min; may repeat at 1 min intervals; not to exceed 4 doses (1 mg)  . fentaNYL (SUBLIMAZE) injection 25-50 mcg    Make sure Narcan is available in the pyxis when using this medication. In the event of respiratory depression (RR< 8/min): Titrate NARCAN (naloxone) in increments of 0.1 to 0.2 mg IV at 2-3 minute intervals, until desired degree of reversal.  . sodium chloride flush (NS) 0.9 % injection 1 mL  . ropivacaine (PF) 2 mg/mL (0.2%) (NAROPIN) injection 1 mL  . dexamethasone (DECADRON) injection 10 mg  . sodium chloride flush (NS) 0.9 % injection 2 mL  . ropivacaine (PF) 2 mg/mL (0.2%) (NAROPIN) injection 2 mL  . triamcinolone acetonide (KENALOG-40) injection 40 mg   Medications administered: We administered iohexol, lidocaine, lactated ringers, midazolam, fentaNYL, sodium chloride flush, ropivacaine (PF) 2 mg/mL (0.2%), dexamethasone, sodium chloride flush, ropivacaine (PF) 2 mg/mL (0.2%), and triamcinolone acetonide.  See the medical record for exact dosing, route, and time of administration.  Follow-up plan:   Return in about 2 weeks (around 04/23/2019) for (VV), (PP).       Interventional management options:  Considering:   NOTE: The patient is medical psychology evaluation indicates that the patient is a "suitable candidate" for a spinal cord stimulator trial. Possible lumbar spinal cord stimulator trial Diagnosticbilateral lumbar facet block Possible bilateral lumbar facetRFA Diagnostic left L5 TFESI Diagnostic left L5 SNRB Possible left L5 DRG RFA Diagnostic leftLSB Possible left lumbar sympathetic RFA Diagnostic  bilateral cervical facet block Possible bilateral cervical facetRFA Diagnosticleft IA shoulder injection Diagnosticleft suprascapular NB Diagnostic left suprascapularRFA   Palliative PRN treatment(s):   Diagnostic left L5 TFESI #2 + right L4-5 LESI #2  Palliative bilateral lumbar facet blocks Palliative right lumbar facet RFA #2 (last done 04/22/2018) Palliative left lumbar facet RFA #2 (last done 03/13/2018)    Recent Visits Date Type Provider Dept  04/06/19 Telemedicine Milinda Pointer, MD [facility]  03/10/19 Procedure visit Milinda Pointer, MD [facility]  03/03/19 Procedure visit Milinda Pointer, MD [facility]  01/21/19 Telemedicine Milinda Pointer, MD [facility]  Showing recent visits within past 90 days and meeting all other requirements   Today's Visits Date Type Provider Dept  04/09/19 Procedure visit Milinda Pointer, MD [facility]  Showing today's visits and meeting all other requirements   Future Appointments Date Type Provider Dept  04/22/19 Appointment Milinda Pointer, MD [facility]  Showing future appointments within next 90 days and meeting all other requirements   Disposition: Discharge home  Discharge (Date  Time): 04/09/2019; 1216 hrs.   Primary Care Physician: Sharyne Peach, MD Location: [facility] Note by: Gaspar Cola, MD Date: 04/09/2019; Time: 12:43 PM  Disclaimer:  Medicine is not an Chief Strategy Officer. The only guarantee in medicine is that nothing is guaranteed. It is important to note that the decision to proceed with this intervention was based on the information collected from the patient. The Data and conclusions were drawn from the patient's questionnaire, the interview, and the physical examination. Because the information was provided in large part by the patient, it cannot be guaranteed that it has not been purposely or  unconsciously manipulated.  Every effort has been made to obtain as much relevant data as possible for this evaluation. It is important to note that the conclusions that lead to this procedure are derived in large part from the available data. Always take into account that the treatment will also be dependent on availability of resources and existing treatment guidelines, considered by other Pain Management Practitioners as being common knowledge and practice, at the time of the intervention. For Medico-Legal purposes, it is also important to point out that variation in procedural techniques and pharmacological choices are the acceptable norm. The indications, contraindications, technique, and results of the above procedure should only be interpreted and judged by a Board-Certified Interventional Pain Specialist with extensive familiarity and expertise in the same exact procedure and technique.

## 2019-04-09 NOTE — Patient Instructions (Signed)

## 2019-04-10 ENCOUNTER — Telehealth: Payer: Self-pay

## 2019-04-10 NOTE — Telephone Encounter (Signed)
Post procedure phone call. Patient states she is doing good.  

## 2019-04-13 ENCOUNTER — Ambulatory Visit: Payer: Medicare HMO | Admitting: Pain Medicine

## 2019-04-14 ENCOUNTER — Ambulatory Visit: Payer: Medicare HMO | Admitting: Psychiatry

## 2019-04-14 ENCOUNTER — Other Ambulatory Visit: Payer: Self-pay

## 2019-04-15 ENCOUNTER — Ambulatory Visit: Payer: Medicare HMO | Admitting: Pain Medicine

## 2019-04-15 ENCOUNTER — Other Ambulatory Visit: Payer: Self-pay | Admitting: Psychiatry

## 2019-04-15 ENCOUNTER — Other Ambulatory Visit: Payer: Self-pay | Admitting: Neurosurgery

## 2019-04-15 DIAGNOSIS — F410 Panic disorder [episodic paroxysmal anxiety] without agoraphobia: Secondary | ICD-10-CM

## 2019-04-15 DIAGNOSIS — G894 Chronic pain syndrome: Secondary | ICD-10-CM

## 2019-04-20 ENCOUNTER — Encounter: Payer: Self-pay | Admitting: Pain Medicine

## 2019-04-21 ENCOUNTER — Encounter: Payer: Self-pay | Admitting: Pain Medicine

## 2019-04-21 NOTE — Progress Notes (Signed)
Patient: Marie Clarke  Service Category: E/M  Provider: Gaspar Cola, MD  DOB: 1971/01/20  DOS: 04/22/2019  Location: Office  MRN: [id number]  Setting: Ambulatory outpatient  Referring Provider: Sharyne Peach, MD  Type: Established Patient  Specialty: Interventional Pain Management  PCP: Sharyne Peach, MD  Location: Remote location  Delivery: TeleHealth     Virtual Encounter - Pain Management PROVIDER NOTE: Information contained herein reflects review and annotations entered in association with encounter. Interpretation of such information and data should be left to medically-trained personnel. Information provided to patient can be located elsewhere in the medical record under "Patient Instructions". Document created using STT-dictation technology, any transcriptional errors that may result from process are unintentional.    Contact & Pharmacy Preferred: [phone] Home: [phone] (home) Mobile: [phone] (mobile) E-mail: [email or web address]  [place], [place] [facility] [place] [address] [place] [address] Phone: [phone] Fax: [phone]   Pre-screening  Marie Clarke offered "in-person" vs "virtual" encounter. She indicated preferring virtual for this encounter.   Reason COVID-19*  Social distancing based on CDC and AMA recommendations.   I contacted Marie Clarke on 04/22/2019 via telephone.      I clearly identified myself as Gaspar Cola, MD. I verified that I was speaking with the correct person using two identifiers (Name: Marie Clarke, and date of birth: Mar 09, 1970).  Consent I sought verbal advanced consent from [facility] for virtual visit interactions. I informed Marie Clarke of possible security and privacy concerns, risks, and limitations associated with providing "not-in-person" medical evaluation and management services. I also informed Marie Clarke of the availability of "in-person" appointments. Finally, I informed her that there  would be a charge for the virtual visit and that she could be  personally, fully or partially, financially responsible for it. Marie Clarke expressed understanding and agreed to proceed.   Historic Elements   Marie Clarke is a 49 y.o. year old, female patient evaluated today after her last contact with our practice on 04/10/2019. Marie Clarke  has a past medical history of Anemia, Anxiety, Asthma, Bipolar disorder ([place]), CAD (coronary artery disease) ([place]), CHF (congestive heart failure) ([place]), COPD (chronic obstructive pulmonary disease) ([place]), Depression ([place]), Diabetes mellitus without complication ([place]), Diabetes mellitus, type II ([place]), Drug overdose, GERD (gastroesophageal reflux disease), Headache, Hyperlipidemia, Hypertension, Left leg pain (04/29/2014), MI (myocardial infarction) ([place]), Muscle ache (09/16/2014), Osteoporosis, Overactive bladder, Pancreatitis ([place]), Reflex sympathetic dystrophy, Renal insufficiency, Sleep apnea, Sleep apnea, Stroke ([place]), Thyroid disease, TIA (transient ischemic attack) ([place]), and TIA (transient ischemic attack). She also  has a past surgical history that includes prolapse rectum surgery (N/A, July 2016); Tonsillectomy; Abdominal hysterectomy; and Hernia repair (07/15/2017). Marie Clarke has a current medication list which includes the following prescription(s): aspirin, atorvastatin, b-complex with vitamin c, buspirone, carvedilol, celecoxib, vitamin d3, colchicine, darifenacin, dicyclomine, diphenoxylate-atropine, dulaglutide, famotidine, hydroxyzine, klor-con m20, accu-chek safe-t pro, levocetirizine, levothyroxine, levothyroxine, magnesium oxide, methocarbamol, metoprolol succinate, montelukast, nitroglycerin, olopatadine hcl, omeprazole, ondansetron, pregabalin, prochlorperazine, promethazine, quetiapine, rizatriptan, ropinirole, solifenacin, topiramate, tramadol, trelegy ellipta, triamterene-hydrochlorothiazide, ventolin hfa, vitamin e, and torsemide. She  reports that she quit  smoking about 7 months ago. Her smoking use included cigarettes. She smoked 0.50 packs per day. She has never used smokeless tobacco. She reports that she does not drink alcohol or use drugs. Marie Clarke is allergic to azithromycin; diazepam; levofloxacin; lisinopril; metronidazole; sulfa antibiotics; valproic acid; ziprasidone hcl; divalproex sodium; cephalexin; ciprofloxacin; and penicillins.   HPI  Today, she is being contacted for  a post-procedure assessment.  She indicates is doing really good after the right side at L5 transforaminal and a right-sided L5-S1 interlaminar combination.  She describes that this is the better she has felt in a long time.  She wants to go ahead and have the MRI, but she is thinking about holding on the spinal cord stimulator implant for now.  I totally agree with this and support her decision.  Even though she initially told the nurses that she had attained 50% benefit from the injection, it would seem that this is improving with time and as a matter of fact, she indicates that today she is having absolutely no pain and therefore she has attained 100% relief of her pain.  In view of this, today I will add a as needed order for this combination of shots that she can have whenever the pain returns.  The patient was informed of this and she agrees with the plan.  She also indicates that her primary care physician has taken over her gabapentin.  Post-Procedure Evaluation  Procedure (04/09/2019): Diagnostic right sided L5 transforaminal ESI #1 + right-sided L5-S1 interlaminar LESI #1 under fluoroscopic guidance and IV sedation Pre-procedure pain level:  9/10 Post-procedure: 0/10 (100% relief)  Sedation: Sedation provided.  Dewayne Shorter, RN  04/21/2019  9:11 AM  Sign when Signing Visit Pain relief after procedure (treated area only): (Questions asked to patient) 1. Starting about 15 minutes after the procedure, and "while the area was still numb" (from the local anesthetics),  were you having any of your usual pain "in that area" (the treated area)?  (NOTE: NOT including the discomfort from the needle sticks.) First 1 hour: 100 % better. First 4-6 hours: 100 % better.  3. How much better is your pain now, when compared to before the procedure? Current benefit: 100 % better. 4. Can you move better now? Improvement in ROM (Range of Motion): Yes. 5. Can you do more now? Improvement in function: Yes. 4. Did you have any problems with the procedure? Side-effects/Complications: No.  Current benefits: Defined as benefit that persist at this time.   Analgesia:  90-100% better Function: Marie Clarke reports improvement in function ROM: Marie Clarke reports improvement in ROM  Pharmacotherapy Assessment  Analgesic: Tramadol 50 mg, 1-2 tab q 6 hrs (200-400 mg/day of tramadol) High-Risk due to history of a prior suicidal attempt w/ medications. MME/day:20-10m/day.   Monitoring: [place] PMP: PDMP reviewed during this encounter.       Pharmacotherapy: No side-effects or adverse reactions reported. Compliance: No problems identified. Effectiveness: Clinically acceptable. Plan: Refer to "POC".  UDS:  Summary  Date Value Ref Range Status  01/22/2018 FINAL  Final    Comment:    ==================================================================== TOXASSURE SELECT 13 (MW) ==================================================================== Test                             Result       Flag       Units Drug Present and Declared for Prescription Verification   Tramadol                       2742         EXPECTED   ng/mg creat   O-Desmethyltramadol            5630         EXPECTED   ng/mg creat   N-Desmethyltramadol  2591         EXPECTED   ng/mg creat    Source of tramadol is a prescription medication.    O-desmethyltramadol and N-desmethyltramadol are expected    metabolites of tramadol. ==================================================================== Test                       Result    Flag   Units      Ref Range   Creatinine              66               mg/dL      >=20 ==================================================================== Declared Medications:  The flagging and interpretation on this report are based on the  following declared medications.  Unexpected results may arise from  inaccuracies in the declared medications.  **Note: The testing scope of this panel includes these medications:  Tramadol  **Note: The testing scope of this panel does not include following  reported medications:  Acetaminophen  Aspirin (Aspirin 81)  Atorvastatin  Atropine (Lomotil)  Azelastine  Bumetanide  Buspirone  Diphenoxylate (Lomotil)  Fluticasone (Trelegy Ellipta)  Gabapentin  Hydroxyzine (Vistaril)  Lamotrigine  Levocetirizine (Xyzal)  Levothyroxine  Magnesium Oxide  Metformin  Methocarbamol  Mirabegron (Myrbetriq)  Montelukast  Nicotine  Omega-3 Fatty Acids  Ondansetron  Potassium  Potassium (Klor-Con)  Promethazine  Quetiapine  Rizatriptan  Umeclidinium (Trelegy Ellipta)  Vilanterol (Trelegy Ellipta) ==================================================================== For clinical consultation, please call [phone]. ====================================================================    Laboratory Chemistry Profile   Renal Lab Results  Component Value Date   BUN 26 (H) 02/07/2019   CREATININE 1.27 (H) 02/07/2019   BCR 16 09/09/2017   GFRAA 58 (L) 02/07/2019   GFRNONAA 50 (L) 02/07/2019    Hepatic Lab Results  Component Value Date   AST 22 02/07/2019   ALT 18 02/07/2019   ALBUMIN 4.5 02/07/2019   ALKPHOS 107 02/07/2019   LIPASE 39 11/17/2018    Electrolytes Lab Results  Component Value Date   NA 138 02/07/2019   K 3.8 02/07/2019   CL 97 (L) 02/07/2019   CALCIUM 9.0 02/07/2019   MG 1.9 09/09/2017    Bone Lab Results  Component Value Date   25OHVITD1 43 09/09/2017   25OHVITD2 <1.0 09/09/2017    25OHVITD3 43 09/09/2017    Inflammation (CRP: Acute Phase) (ESR: Chronic Phase) Lab Results  Component Value Date   CRP 21 (H) 09/09/2017   ESRSEDRATE 50 (H) 09/09/2017   LATICACIDVEN 1.6 01/11/2019      Note: Above Lab results reviewed.  Imaging  DG PAIN CLINIC C-ARM 1-60 MIN NO REPORT Fluoro was used, but no Radiologist interpretation will be provided.  Please refer to "NOTES" tab for provider progress note.  Assessment  The primary encounter diagnosis was Chronic low back pain (Primary Area of Pain) (Bilateral) (L>R). Diagnoses of Chronic lumbosacral L5-S1 IVD protrusion (Bilateral), Lumbar foraminal stenosis (Bilateral) (L5-S1), Lumbar lateral recess stenosis (L5-S1) (Bilateral), and Lumbosacral radiculopathy at L5 (Right) were also pertinent to this visit.  Plan of Care  Problem-specific:  No problem-specific Assessment & Plan notes found for this encounter.  Marie Clarke has a current medication list which includes the following long-term medication(s): atorvastatin, carvedilol, celecoxib, colchicine, klor-con m20, levothyroxine, levothyroxine, magnesium oxide, methocarbamol, montelukast, nitroglycerin, pregabalin, quetiapine, rizatriptan, ropinirole, tramadol, triamterene-hydrochlorothiazide, and torsemide.  Pharmacotherapy (Medications Ordered): No orders of the defined types were placed in this encounter.  Orders:  Orders Placed This Encounter  Procedures  .  Lumbar Epidural Injection    Standing Status:   Standing    Number of Occurrences:   9    Standing Expiration Date:   10/22/2020    Scheduling Instructions:     Purpose: Palliative     Indication: Lower extremity pain/Sciatica right (M54.31).     Side: Right-sided     Level: L5-S1     Sedation: With Sedation.     TIMEFRAME: PRN procedure. (Marie Clarke will call when needed.)    Order Specific Question:   Where will this procedure be performed?    Answer:   [facility]  . Lumbar Transforaminal Epidural     Standing Status:   Standing    Number of Occurrences:   9    Standing Expiration Date:   10/22/2020    Scheduling Instructions:     Purpose: Palliative     Indication: Lower extremity pain. Radiculopathy/Radiculitis lumbosacral (M54.17).     Side: Right-sided     Level: L5     Sedation: With Sedation.     TIMEFRAME: PRN procedure. (Marie Clarke will call when needed.)    Order Specific Question:   Where will this procedure be performed?    Answer:   [facility]   Follow-up plan:   Return in about 3 months (around 07/20/2019) for (VV), (MM), in addition, PRN Procedure(s): (R) L5 TFESI #2 + (R) L5-S1 LESI #2, (w/ Sedation).      Interventional management options:  Considering:   NOTE: The patient is medical psychology evaluation indicates that the patient is a "suitable candidate" for a spinal cord stimulator trial. Possible lumbar spinal cord stimulator trial(04/22/2019.  The patient indicates wanting to hold on this for now) Diagnosticbilateral lumbar facet block Possible bilateral lumbar facetRFA Diagnostic left L5 TFESI Diagnostic left L5 SNRB Possible left L5 DRG RFA Diagnostic leftLSB Possible left lumbar sympathetic RFA Diagnostic bilateral cervical facet block Possible bilateral cervical facetRFA Diagnosticleft IA shoulder injection Diagnosticleft suprascapular NB Diagnostic left suprascapularRFA   Palliative PRN treatment(s):   Therapeutic/palliative left L5 TFESI #2 + right L4-5 LESI #2 (100/100/50/100) Palliative bilateral lumbar facet blocks Palliative right lumbar facet RFA #2 (last done 04/22/2018) Palliative left lumbar facet RFA #2 (last done 03/13/2018)    Recent Visits Date Type Provider Dept  04/09/19 Procedure visit Milinda Pointer, MD [facility]  04/06/19 Telemedicine Milinda Pointer, MD [facility]  03/10/19 Procedure visit Milinda Pointer, MD [facility]  03/03/19 Procedure visit Milinda Pointer, MD [facility]  Showing recent visits within past 90 days and meeting all other requirements   Today's Visits Date Type Provider Dept  04/22/19 Telemedicine Milinda Pointer, MD [facility]  Showing today's visits and meeting all other requirements   Future Appointments No visits were found meeting these conditions.  Showing future appointments within next 90 days and meeting all other requirements   I discussed the assessment and treatment plan with the patient. The patient was provided an opportunity to ask questions and all were answered. The patient agreed with the plan and demonstrated an understanding of the instructions.  Patient advised to call back or seek an in-person evaluation if the symptoms or condition worsens.  Duration of encounter: 14 minutes.  Note by: Gaspar Cola, MD Date: 04/22/2019; Time: 11:26 AM

## 2019-04-21 NOTE — Progress Notes (Signed)
Pain relief after procedure (treated area only): (Questions asked to patient) 1. Starting about 15 minutes after the procedure, and "while the area was still numb" (from the local anesthetics), were you having any of your usual pain "in that area" (the treated area)?  (NOTE: NOT including the discomfort from the needle sticks.) First 1 hour: 100 % better. First 4-6 hours:100 % better.   3. How much better is your pain now, when compared to before the procedure? Current benefit: 50 % better. 4. Can you move better now? Improvement in ROM (Range of Motion): Yes. 5. Can you do more now? Improvement in function: Yes. 4. Did you have any problems with the procedure? Side-effects/Complications: No. 

## 2019-04-22 ENCOUNTER — Other Ambulatory Visit: Payer: Self-pay

## 2019-04-22 ENCOUNTER — Ambulatory Visit: Payer: Medicare HMO | Admitting: Pain Medicine

## 2019-04-22 ENCOUNTER — Ambulatory Visit: Payer: Medicare HMO | Attending: Pain Medicine | Admitting: Pain Medicine

## 2019-04-22 DIAGNOSIS — M5127 Other intervertebral disc displacement, lumbosacral region: Secondary | ICD-10-CM | POA: Diagnosis not present

## 2019-04-22 DIAGNOSIS — M545 Low back pain, unspecified: Secondary | ICD-10-CM

## 2019-04-22 DIAGNOSIS — M5417 Radiculopathy, lumbosacral region: Secondary | ICD-10-CM

## 2019-04-22 DIAGNOSIS — M48061 Spinal stenosis, lumbar region without neurogenic claudication: Secondary | ICD-10-CM | POA: Diagnosis not present

## 2019-04-22 DIAGNOSIS — G8929 Other chronic pain: Secondary | ICD-10-CM

## 2019-04-22 NOTE — Patient Instructions (Signed)

## 2019-04-29 ENCOUNTER — Ambulatory Visit: Payer: Medicare HMO

## 2019-04-30 ENCOUNTER — Encounter: Payer: Self-pay | Admitting: Psychiatry

## 2019-04-30 ENCOUNTER — Other Ambulatory Visit: Payer: Self-pay

## 2019-04-30 ENCOUNTER — Ambulatory Visit (INDEPENDENT_AMBULATORY_CARE_PROVIDER_SITE_OTHER): Payer: Medicare HMO | Admitting: Psychiatry

## 2019-04-30 DIAGNOSIS — F17200 Nicotine dependence, unspecified, uncomplicated: Secondary | ICD-10-CM | POA: Diagnosis not present

## 2019-04-30 DIAGNOSIS — F4310 Post-traumatic stress disorder, unspecified: Secondary | ICD-10-CM | POA: Diagnosis not present

## 2019-04-30 DIAGNOSIS — F3160 Bipolar disorder, current episode mixed, unspecified: Secondary | ICD-10-CM | POA: Diagnosis not present

## 2019-04-30 DIAGNOSIS — Z9119 Patient's noncompliance with other medical treatment and regimen: Secondary | ICD-10-CM

## 2019-04-30 DIAGNOSIS — F410 Panic disorder [episodic paroxysmal anxiety] without agoraphobia: Secondary | ICD-10-CM

## 2019-04-30 DIAGNOSIS — Z91199 Patient's noncompliance with other medical treatment and regimen due to unspecified reason: Secondary | ICD-10-CM

## 2019-04-30 MED ORDER — DESVENLAFAXINE SUCCINATE ER 25 MG PO TB24: 25.0000 mg | ORAL_TABLET | Freq: Every day | ORAL | 1 refills | Status: DC

## 2019-04-30 MED ORDER — BUSPIRONE HCL 15 MG PO TABS: 15.0000 mg | ORAL_TABLET | Freq: Two times a day (BID) | ORAL | 1 refills | Status: DC

## 2019-04-30 NOTE — Progress Notes (Signed)
Provider Location : [facility] Patient Location : Home  Virtual Visit via Telephone Note  I connected with Marie Clarke on 04/30/19 at  9:30 AM EDT by telephone and verified that I am speaking with the correct person using two identifiers.   I discussed the limitations, risks, security and privacy concerns of performing an evaluation and management service by telephone and the availability of in person appointments. I also discussed with the patient that there may be a patient responsible charge related to this service. The patient expressed understanding and agreed to proceed.    I discussed the assessment and treatment plan with the patient. The patient was provided an opportunity to ask questions and all were answered. The patient agreed with the plan and demonstrated an understanding of the instructions.   The patient was advised to call back or seek an in-person evaluation if the symptoms worsen or if the condition fails to improve as anticipated.   Hand MD OP Progress Note  04/30/2019 11:21 AM Marie Clarke  MRN:  [id number]  Chief Complaint:  Chief Complaint    Follow-up     HPI: Marie Clarke is a 49 year old Caucasian female, single, on SSD, lives in [place], has a history of bipolar disorder, PTSD, panic attacks, coronary artery disease, OSA on CPAP, COPD, hyperlipidemia, hypertension, hypothyroidism, back pain was evaluated by phone today.  Patient preferred to do a phone call.  Patient today reports she continues to be irritable and nervous all the time.  She reports she does struggle with a lot of back pain.  She rates her pain at a 6 out of 10 today, 10 being the worst.  She currently takes tramadol once a day during the day which helps with her pain to some extent.  She continues to follow-up with her provider for the same.  Patient reports due to her irritability she has been getting more and more angry, and has been getting into arguments.  Patient reports sleep is good on the  Seroquel.  Patient reports she did follow-up with her therapist couple of times however the last visit was sometime in February.  She has not followed up with her therapist in over a month and a half.  She reports her last visit was canceled by her therapist and she did not call back to schedule an appointment yet.   Discussed several medications however patient reports adverse side effects or lack of benefit from multiple medications.  She is already on 2 mood stabilizers she is on Seroquel as well as gabapentin which she takes for pain.  Patient has never tried Pristiq and is agreeable to giving it a trial.  Patient denies any suicidality, homicidality or perceptual disturbances. Visit Diagnosis:    ICD-10-CM   1. Bipolar I disorder, most recent episode mixed (HCC)  F31.60 Desvenlafaxine Succinate ER (PRISTIQ) 25 MG TB24    busPIRone (BUSPAR) 15 MG tablet  2. PTSD (post-traumatic stress disorder)  F43.10 Desvenlafaxine Succinate ER (PRISTIQ) 25 MG TB24    busPIRone (BUSPAR) 15 MG tablet  3. Panic attacks  F41.0 Desvenlafaxine Succinate ER (PRISTIQ) 25 MG TB24    busPIRone (BUSPAR) 15 MG tablet  4. Tobacco use disorder  F17.200   5. Noncompliance with treatment regimen  Z91.19     Past Psychiatric History: I have reviewed past psychiatric history from my progress note on 03/06/2017.  Past Medical History:  Past Medical History:  Diagnosis Date  . Anemia   . Anxiety   . Asthma   .  Bipolar disorder ([place])   . CAD (coronary artery disease) unk  . CHF (congestive heart failure) ([place])   . COPD (chronic obstructive pulmonary disease) ([place])   . Depression unk  . Diabetes mellitus without complication ([place])   . Diabetes mellitus, type II ([place])   . Drug overdose   . GERD (gastroesophageal reflux disease)   . Headache   . Hyperlipidemia   . Hypertension   . Left leg pain 04/29/2014  . MI (myocardial infarction) ([place])   . Muscle ache 09/16/2014  . Osteoporosis   . Overactive bladder   .  Pancreatitis unk  . Reflex sympathetic dystrophy   . Renal insufficiency   . Sleep apnea    pt reported on 2/6/7 she currently is not using CPAP  . Sleep apnea   . Stroke ([place])   . Thyroid disease   . TIA (transient ischemic attack) unk  . TIA (transient ischemic attack)     Past Surgical History:  Procedure Laterality Date  . ABDOMINAL HYSTERECTOMY    . HERNIA REPAIR  07/15/2017   [facility]  . prolapse rectum surgery N/A July 2016  . TONSILLECTOMY      Family Psychiatric History: I have reviewed family psychiatric history from my progress note on 03/06/2017.  Family History:  Family History  Problem Relation Age of Onset  . Diabetes Mellitus II Mother   . CAD Mother   . Sleep apnea Mother   . Osteoarthritis Mother   . Osteoporosis Mother   . Anxiety disorder Mother   . Depression Mother   . Bipolar disorder Mother   . Bipolar disorder Father   . Hypertension Father   . Depression Father   . Anxiety disorder Father   . Post-traumatic stress disorder Sister     Social History: I have reviewed social history from my progress note on 03/06/2017. Social History   Socioeconomic History  . Marital status: Single    Spouse name: Not on file  . Number of children: 0  . Years of education: Not on file  . Highest education level: High school graduate  Occupational History    Comment: not employed  Tobacco Use  . Smoking status: Former Smoker    Packs/day: 0.50    Types: Cigarettes    Quit date: 09/01/2018    Years since quitting: 0.6  . Smokeless tobacco: Never Used  . Tobacco comment: Patient quit smoking 09/01/2018  Substance and Sexual Activity  . Alcohol use: No    Alcohol/week: 0.0 standard drinks  . Drug use: No  . Sexual activity: Not Currently  Other Topics Concern  . Not on file  Social History Narrative  . Not on file   Social Determinants of Health   Financial Resource Strain:   . Difficulty of Paying Living Expenses:   Food Insecurity:   . Worried  About Charity fundraiser in the Last Year:   . Arboriculturist in the Last Year:   Transportation Needs:   . Film/video editor (Medical):   Marland Kitchen Lack of Transportation (Non-Medical):   Physical Activity:   . Days of Exercise per Week:   . Minutes of Exercise per Session:   Stress:   . Feeling of Stress :   Social Connections:   . Frequency of Communication with Friends and Family:   . Frequency of Social Gatherings with Friends and Family:   . Attends Religious Services:   . Active Member of Clubs or Organizations:   .  Attends Archivist Meetings:   Marland Kitchen Marital Status:     Allergies:  Allergies  Allergen Reactions  . Azithromycin Hives  . Diazepam Hives and Nausea And Vomiting  . Levofloxacin Hives and Rash    1/31: would like to retry since not taking valium  . Lisinopril     Medicine cause kidney failure  . Metronidazole Hives    1/31: would like to retry since she is no longer taking valium  . Sulfa Antibiotics Hives  . Valproic Acid Rash and Itching  . Ziprasidone Hcl Other (See Comments) and Itching    CONFUSED CONFUSED Other reaction(s): Other (See Comments) CONFUSED CONFUSED   . Divalproex Sodium Itching  . Cephalexin Hives  . Ciprofloxacin Hives  . Penicillins Nausea And Vomiting    Has patient had a PCN reaction causing immediate rash, facial/tongue/throat swelling, SOB or lightheadedness with hypotension: No Has patient had a PCN reaction causing severe rash involving mucus membranes or skin necrosis: No Has patient had a PCN reaction that required hospitalization: No Has patient had a PCN reaction occurring within the last 10 years: No If all of the above answers are "NO", then may proceed with Cephalosporin use.     Metabolic Disorder Labs: Lab Results  Component Value Date   HGBA1C 6.3 (H) 09/29/2018   MPG 134.11 09/29/2018   No results found for: PROLACTIN No results found for: CHOL, TRIG, HDL, CHOLHDL, VLDL, LDLCALC Lab Results   Component Value Date   TSH 2.296 11/17/2018   TSH 3.335 09/29/2018    Therapeutic Level Labs: No results found for: LITHIUM Lab Results  Component Value Date   VALPROATE 33 (L) 09/29/2018   No components found for:  CBMZ  Current Medications: Current Outpatient Medications  Medication Sig Dispense Refill  . gabapentin (NEURONTIN) 600 MG tablet Take by mouth.    Marland Kitchen aspirin 81 MG chewable tablet Chew by mouth.    Marland Kitchen atorvastatin (LIPITOR) 40 MG tablet Take 40 mg by mouth at bedtime.     . B Complex-C (B-COMPLEX WITH VITAMIN C) tablet Take by mouth.    . busPIRone (BUSPAR) 15 MG tablet Take 1 tablet (15 mg total) by mouth 2 (two) times daily. 60 tablet 1  . carvedilol (COREG) 3.125 MG tablet Take by mouth.    . celecoxib (CELEBREX) 100 MG capsule Take 1 capsule (100 mg total) by mouth 2 (two) times daily. 60 capsule 5  . Cholecalciferol (VITAMIN D3) 125 MCG (5000 UT) TABS Take by mouth.    . colchicine (COLCRYS) 0.6 MG tablet Colcrys 0.6 mg tablet    . darifenacin (ENABLEX) 7.5 MG 24 hr tablet Take 7.5 mg by mouth at bedtime.    Marland Kitchen Desvenlafaxine Succinate ER (PRISTIQ) 25 MG TB24 Take 25 mg by mouth at bedtime. 30 tablet 1  . dicyclomine (BENTYL) 10 MG capsule dicyclomine 10 mg capsule    . diphenoxylate-atropine (LOMOTIL) 2.5-0.025 MG tablet Take by mouth.    . Dulaglutide 0.75 MG/0.5ML SOPN Inject 0.75 mg into the skin every Saturday.     . famotidine (PEPCID) 20 MG tablet Take 20 mg by mouth daily.    Marland Kitchen glucose blood test strip OneTouch Verio test strips    . hydrOXYzine (ATARAX/VISTARIL) 25 MG tablet TAKE 1 TABLET BY MOUTH EVERY 6 HOURS AS NEEDED 120 tablet 1  . KLOR-CON M20 20 MEQ tablet Take 0.5 tablets (10 mEq total) by mouth 2 (two) times daily. (Patient taking differently: Take 20 mEq by mouth daily. But  can take twice a day if needed) 30 tablet 0  . Lancets (ACCU-CHEK SAFE-T PRO) lancets     . levocetirizine (XYZAL) 5 MG tablet Take 5 mg by mouth daily.     Marland Kitchen levothyroxine  (SYNTHROID) 50 MCG tablet Take 50 mcg by mouth daily. (261mcg total daily)    . levothyroxine (SYNTHROID, LEVOTHROID) 200 MCG tablet Take 200 mcg by mouth daily. (268mcg total daily)    . Magnesium Oxide 500 MG CAPS Take 1 capsule (500 mg total) by mouth 2 (two) times daily at 8 am and 10 pm. 60 capsule 11  . methocarbamol (ROBAXIN) 750 MG tablet Take 1 tablet (750 mg total) by mouth every 8 (eight) hours as needed for muscle spasms. 90 tablet 5  . metoprolol succinate (TOPROL-XL) 25 MG 24 hr tablet Take 25 mg by mouth daily.    . montelukast (SINGULAIR) 10 MG tablet Take 10 mg by mouth daily.     . nitroGLYCERIN (NITROSTAT) 0.4 MG SL tablet Place 0.4 mg under the tongue every 5 (five) minutes as needed for chest pain.     Marland Kitchen Olopatadine HCl 0.2 % SOLN     . omeprazole (PRILOSEC) 20 MG capsule Take 20 mg by mouth daily.    . ondansetron (ZOFRAN-ODT) 8 MG disintegrating tablet Take 8 mg by mouth every 8 (eight) hours as needed for nausea or vomiting.    . pregabalin (LYRICA) 150 MG capsule Take 1 capsule (150 mg total) by mouth 2 (two) times daily. 60 capsule 5  . prochlorperazine (COMPAZINE) 10 MG tablet prochlorperazine maleate 10 mg tablet    . promethazine (PHENERGAN) 25 MG tablet Take 25 mg by mouth as needed.    Marland Kitchen QUEtiapine (SEROQUEL) 100 MG tablet Take 1-1.5 tablets (100-150 mg total) by mouth at bedtime as needed (sleep). 135 tablet 1  . rizatriptan (MAXALT) 10 MG tablet Take 1 tablet (10 mg total) by mouth as needed. 10 tablet 5  . rOPINIRole (REQUIP) 0.25 MG tablet Take 1 tablet by mouth in the morning, at noon, and at bedtime.    . solifenacin (VESICARE) 5 MG tablet Take by mouth.    . topiramate (TOPAMAX) 50 MG tablet Take 50 mg by mouth 2 (two) times daily.    Marland Kitchen torsemide (DEMADEX) 20 MG tablet Take 1 tablet (20 mg total) by mouth 2 (two) times daily. 60 tablet 0  . traMADol (ULTRAM) 50 MG tablet Take 1-2 tablets (50-100 mg total) by mouth every 6 (six) hours. 240 tablet 5  . TRELEGY  ELLIPTA 100-62.5-25 MCG/INH AEPB Inhale 1 puff into the lungs daily.     Marland Kitchen triamterene-hydrochlorothiazide (MAXZIDE-25) 37.5-25 MG tablet Take by mouth.    . VENTOLIN HFA 108 (90 Base) MCG/ACT inhaler Inhale 1-2 puffs into the lungs every 6 (six) hours as needed for wheezing or shortness of breath.     . vitamin E 400 UNIT capsule vitamin e 400 iu synthetic     No current facility-administered medications for this visit.     Musculoskeletal: Strength & Muscle Tone: UTA Gait & Station: Reports as WNL Patient leans: UTA  Psychiatric Specialty Exam: Review of Systems  Musculoskeletal: Positive for back pain.  Psychiatric/Behavioral: The patient is nervous/anxious.        Irritable  All other systems reviewed and are negative.   There were no vitals taken for this visit.There is no height or weight on file to calculate BMI.  General Appearance: UTA  Eye Contact:  UTA  Speech:  Clear  and Coherent  Volume:  Normal  Mood:  Anxious and Irritable  Affect:  UTA  Thought Process:  Goal Directed and Descriptions of Associations: Circumstantial  Orientation:  Full (Time, Place, and Person)  Thought Content: Logical   Suicidal Thoughts:  No  Homicidal Thoughts:  No  Memory:  Immediate;   Fair Recent;   Fair Remote;   Fair  Judgement:  Fair  Insight:  Fair  Psychomotor Activity:  UTA  Concentration:  Concentration: Fair and Attention Span: Fair  Recall:  [employer] of Knowledge: Fair  Language: Fair  Akathisia:  No  Handed:  Right  AIMS (if indicated): UTA  Assets:  Communication Skills Desire for Improvement Housing Social Support  ADL's:  Intact  Cognition: WNL  Sleep:  Fair   Screenings: PHQ2-9     Procedure visit from 04/22/2018 in [facility] Procedure visit from 03/13/2018 in [facility] Office Visit from 12/23/2017 in [facility] Procedure  visit from 12/05/2017 in [facility] Visit from 11/13/2017 in [facility]  PHQ-2 Total Score  0  0  0  0  0       Assessment and Plan: Marie Clarke is a 49 year old Caucasian female who has a history of bipolar disorder, panic attacks, tobacco use disorder, multiple medical problems including chronic back pain was evaluated by phone today.  Patient is currently struggling with mood lability, irritability and anxiety.  She does have psychosocial stressors of pain which could be contributing to her mood lability.  Patient was referred for psychotherapy sessions and had a few sessions however has been noncompliant since the past more than a month.  Patient will benefit from medication readjustment and psychotherapy sessions.  Plan as noted below.  Plan Bipolar disorder-unstable Seroquel 150 mg p.o. nightly Reduce BuSpar to 15 mg p.o. twice daily Add Pristiq 25 mg p.o. nightly.  Provided medication education.  PTSD-improving Patient has been referred for psychotherapy sessions-encourage patient to follow-up with Mr. Vonna Kotyk sheets.  Panic attacks-improving Hydroxyzine 25 mg p.o. every 6 hourly as needed.  Patient advised to limit use due to cognitive side effects.  Tobacco use disorder in early remission Will monitor closely  Noncompliance with treatment plan-patient has been noncompliant with her psychotherapy visits.  Encouraged patient to call and make an appointment and to work with her therapist on a more frequent basis.  I will also communicate with her therapist.  Follow-up in clinic in 3- 4 weeks or sooner if needed.  I have spent atleast 20 minutes non face to face with patient today. More than 50 % of the time was spent for preparing to see the patient ( e.g., review of test, records ), obtaining and to review and separately obtained history , ordering medications and test ,psychoeducation and  supportive psychotherapy and care coordination,as well as documenting clinical information in electronic health record. This note was generated in part or whole with voice recognition software. Voice recognition is usually quite accurate but there are transcription errors that can and very often do occur. I apologize for any typographical errors that were not detected and corrected.      Ursula Alert, MD 04/30/2019, 11:21 AM

## 2019-05-08 ENCOUNTER — Encounter: Payer: Self-pay | Admitting: Pain Medicine

## 2019-05-11 ENCOUNTER — Telehealth: Payer: Self-pay | Admitting: *Deleted

## 2019-05-11 NOTE — Telephone Encounter (Signed)
Called patient and she states that her insurance denied Methocarbamol 750mg .

## 2019-05-11 NOTE — Telephone Encounter (Signed)
Talked with patient and insurance denied Methocarbamol. Is there another muscle relaxer that you can order for her?

## 2019-05-18 NOTE — Telephone Encounter (Signed)
Called patient and instructed her on what Dr Dossie Arbour said in his reply to me. She stated that she has changed insurance company back to [employer]. Instructed her to let us know information when she gets her card. States its the same numbers as before.

## 2019-05-18 NOTE — Telephone Encounter (Signed)
Stop... This is not the way to do this. I am not about to try 10 different ones until I guess which one they allow. Have her look for the muscle relaxants covered by insurance and then let me know which ones I can order.

## 2019-05-19 ENCOUNTER — Other Ambulatory Visit: Payer: Self-pay

## 2019-05-19 ENCOUNTER — Ambulatory Visit
Admission: RE | Admit: 2019-05-19 | Discharge: 2019-05-19 | Disposition: A | Discharge status: Home / Self Care | Payer: Medicare HMO | Source: Ambulatory Visit | Attending: Neurosurgery | Admitting: Neurosurgery

## 2019-05-19 DIAGNOSIS — G894 Chronic pain syndrome: Secondary | ICD-10-CM | POA: Diagnosis not present

## 2019-05-21 ENCOUNTER — Ambulatory Visit (INDEPENDENT_AMBULATORY_CARE_PROVIDER_SITE_OTHER): Payer: Medicare HMO | Admitting: Psychiatry

## 2019-05-21 ENCOUNTER — Encounter: Payer: Self-pay | Admitting: Psychiatry

## 2019-05-21 ENCOUNTER — Other Ambulatory Visit: Payer: Self-pay

## 2019-05-21 DIAGNOSIS — F4310 Post-traumatic stress disorder, unspecified: Secondary | ICD-10-CM | POA: Diagnosis not present

## 2019-05-21 DIAGNOSIS — Z9119 Patient's noncompliance with other medical treatment and regimen: Secondary | ICD-10-CM | POA: Diagnosis not present

## 2019-05-21 DIAGNOSIS — Z91199 Patient's noncompliance with other medical treatment and regimen due to unspecified reason: Secondary | ICD-10-CM

## 2019-05-21 DIAGNOSIS — F3160 Bipolar disorder, current episode mixed, unspecified: Secondary | ICD-10-CM | POA: Diagnosis not present

## 2019-05-21 DIAGNOSIS — F410 Panic disorder [episodic paroxysmal anxiety] without agoraphobia: Secondary | ICD-10-CM | POA: Diagnosis not present

## 2019-05-21 MED ORDER — QUETIAPINE FUMARATE 200 MG PO TABS: 200.0000 mg | ORAL_TABLET | Freq: Every day | ORAL | 0 refills | Status: DC

## 2019-05-21 MED ORDER — DESVENLAFAXINE SUCCINATE ER 50 MG PO TB24: 50.0000 mg | ORAL_TABLET | Freq: Every day | ORAL | 0 refills | Status: DC

## 2019-05-21 NOTE — Patient Instructions (Signed)
Follow-up in clinic May 5 at 9:30 AM

## 2019-05-21 NOTE — Progress Notes (Signed)
Provider Location : [facility] Patient Location : Home  Virtual Visit via Video Note  I connected with Marie Clarke on 05/21/19 at 11:40 AM EDT by a video enabled telemedicine application and verified that I am speaking with the correct person using two identifiers.   I discussed the limitations of evaluation and management by telemedicine and the availability of in person appointments. The patient expressed understanding and agreed to proceed.     I discussed the assessment and treatment plan with the patient. The patient was provided an opportunity to ask questions and all were answered. The patient agreed with the plan and demonstrated an understanding of the instructions.   The patient was advised to call back or seek an in-person evaluation if the symptoms worsen or if the condition fails to improve as anticipated.   [facility] OP Progress Note  05/21/2019 12:08 PM Shulanda Schloemer  MRN:  [id number]  Chief Complaint:  Chief Complaint    Follow-up     HPI: Marie Clarke is a 49 year old Caucasian female, single on SSD, lives in [place], has a history of bipolar disorder, PTSD, panic attacks, coronary artery disease, OSA on CPAP, COPD, hyperlipidemia, hypertension, hypothyroidism, back pain was evaluated by telemedicine today.  Patient today reports she is currently struggling with anxiety symptoms.  She reports she has itching all over and the sense of crawling on her skin.  She reports she believes it is due to her anxiety.  She does not believe she needs to go to a dermatologist or talk to her primary care provider about the same.  However encouraged her to do so.  Patient is compliant on medications.  She reports she currently does not have any side effects.  Patient denies any suicidality, homicidality or perceptual disturbances.  Patient reports sleep is good.  She reports appetite is fair.  She reports she has upcoming appointment with her therapist in April.  Discussed with her the importance of  having more frequent sessions with her therapist.  Patient resistant however agreed.  She denies any other concerns today. Visit Diagnosis:    ICD-10-CM   1. Bipolar I disorder, most recent episode mixed ([name])  F31.60 desvenlafaxine (PRISTIQ) 50 MG 24 hr tablet    QUEtiapine (SEROQUEL) 200 MG tablet  2. PTSD (post-traumatic stress disorder)  F43.10 desvenlafaxine (PRISTIQ) 50 MG 24 hr tablet    QUEtiapine (SEROQUEL) 200 MG tablet  3. Panic attacks  F41.0 desvenlafaxine (PRISTIQ) 50 MG 24 hr tablet    QUEtiapine (SEROQUEL) 200 MG tablet  4. Noncompliance with treatment regimen  Z91.19     Past Psychiatric History: I have reviewed past psychiatric history from my progress note on 03/06/2017.  Past Medical History:  Past Medical History:  Diagnosis Date  . Anemia   . Anxiety   . Asthma   . Bipolar disorder ([place])   . CAD (coronary artery disease) unk  . CHF (congestive heart failure) ([place])   . COPD (chronic obstructive pulmonary disease) ([place])   . Depression unk  . Diabetes mellitus without complication ([place])   . Diabetes mellitus, type II ([place])   . Drug overdose   . GERD (gastroesophageal reflux disease)   . Headache   . Hyperlipidemia   . Hypertension   . Left leg pain 04/29/2014  . MI (myocardial infarction) ([place])   . Muscle ache 09/16/2014  . Osteoporosis   . Overactive bladder   . Pancreatitis unk  . Reflex sympathetic dystrophy   . Renal insufficiency   . Sleep  apnea    pt reported on 2/6/7 she currently is not using CPAP  . Sleep apnea   . Stroke ([place])   . Thyroid disease   . TIA (transient ischemic attack) unk  . TIA (transient ischemic attack)     Past Surgical History:  Procedure Laterality Date  . ABDOMINAL HYSTERECTOMY    . HERNIA REPAIR  07/15/2017   [facility]  . prolapse rectum surgery N/A July 2016  . TONSILLECTOMY      Family Psychiatric History: I have reviewed family psychiatric history from my progress note on 03/06/2017.  Family History:  Family  History  Problem Relation Age of Onset  . Diabetes Mellitus II Mother   . CAD Mother   . Sleep apnea Mother   . Osteoarthritis Mother   . Osteoporosis Mother   . Anxiety disorder Mother   . Depression Mother   . Bipolar disorder Mother   . Bipolar disorder Father   . Hypertension Father   . Depression Father   . Anxiety disorder Father   . Post-traumatic stress disorder Sister     Social History: I have reviewed social history from my progress note on 03/06/2017. Social History   Socioeconomic History  . Marital status: Single    Spouse name: Not on file  . Number of children: 0  . Years of education: Not on file  . Highest education level: High school graduate  Occupational History    Comment: not employed  Tobacco Use  . Smoking status: Former Smoker    Packs/day: 0.50    Types: Cigarettes    Quit date: 09/01/2018    Years since quitting: 0.7  . Smokeless tobacco: Never Used  . Tobacco comment: Patient quit smoking 09/01/2018  Substance and Sexual Activity  . Alcohol use: No    Alcohol/week: 0.0 standard drinks  . Drug use: No  . Sexual activity: Not Currently  Other Topics Concern  . Not on file  Social History Narrative  . Not on file   Social Determinants of Health   Financial Resource Strain:   . Difficulty of Paying Living Expenses:   Food Insecurity:   . Worried About Charity fundraiser in the Last Year:   . Arboriculturist in the Last Year:   Transportation Needs:   . Film/video editor (Medical):   Marland Kitchen Lack of Transportation (Non-Medical):   Physical Activity:   . Days of Exercise per Week:   . Minutes of Exercise per Session:   Stress:   . Feeling of Stress :   Social Connections:   . Frequency of Communication with Friends and Family:   . Frequency of Social Gatherings with Friends and Family:   . Attends Religious Services:   . Active Member of Clubs or Organizations:   . Attends Archivist Meetings:   Marland Kitchen Marital Status:      Allergies:  Allergies  Allergen Reactions  . Azithromycin Hives  . Diazepam Hives and Nausea And Vomiting  . Levofloxacin Hives and Rash    1/31: would like to retry since not taking valium  . Lisinopril     Medicine cause kidney failure  . Metronidazole Hives    1/31: would like to retry since she is no longer taking valium  . Sulfa Antibiotics Hives  . Valproic Acid Rash and Itching  . Ziprasidone Hcl Other (See Comments) and Itching    CONFUSED CONFUSED Other reaction(s): Other (See Comments) CONFUSED CONFUSED   . Divalproex  Sodium Itching  . Cephalexin Hives  . Ciprofloxacin Hives  . Penicillins Nausea And Vomiting    Has patient had a PCN reaction causing immediate rash, facial/tongue/throat swelling, SOB or lightheadedness with hypotension: No Has patient had a PCN reaction causing severe rash involving mucus membranes or skin necrosis: No Has patient had a PCN reaction that required hospitalization: No Has patient had a PCN reaction occurring within the last 10 years: No If all of the above answers are "NO", then may proceed with Cephalosporin use.     Metabolic Disorder Labs: Lab Results  Component Value Date   HGBA1C 6.3 (H) 09/29/2018   MPG 134.11 09/29/2018   No results found for: PROLACTIN No results found for: CHOL, TRIG, HDL, CHOLHDL, VLDL, LDLCALC Lab Results  Component Value Date   TSH 2.296 11/17/2018   TSH 3.335 09/29/2018    Therapeutic Level Labs: No results found for: LITHIUM Lab Results  Component Value Date   VALPROATE 33 (L) 09/29/2018   No components found for:  CBMZ  Current Medications: Current Outpatient Medications  Medication Sig Dispense Refill  . naproxen (NAPROSYN) 500 MG tablet Take by mouth.    Marland Kitchen aspirin 81 MG chewable tablet Chew by mouth.    Marland Kitchen atorvastatin (LIPITOR) 40 MG tablet Take 40 mg by mouth at bedtime.     . B Complex-C (B-COMPLEX WITH VITAMIN C) tablet Take by mouth.    . busPIRone (BUSPAR) 15 MG tablet  Take 1 tablet (15 mg total) by mouth 2 (two) times daily. 60 tablet 1  . carvedilol (COREG) 3.125 MG tablet Take by mouth.    . celecoxib (CELEBREX) 100 MG capsule Take 1 capsule (100 mg total) by mouth 2 (two) times daily. 60 capsule 5  . Cholecalciferol (VITAMIN D3) 125 MCG (5000 UT) TABS Take by mouth.    . colchicine (COLCRYS) 0.6 MG tablet Colcrys 0.6 mg tablet    . darifenacin (ENABLEX) 7.5 MG 24 hr tablet Take 7.5 mg by mouth at bedtime.    Marland Kitchen desvenlafaxine (PRISTIQ) 50 MG 24 hr tablet Take 1 tablet (50 mg total) by mouth at bedtime. 90 tablet 0  . dicyclomine (BENTYL) 10 MG capsule dicyclomine 10 mg capsule    . diphenoxylate-atropine (LOMOTIL) 2.5-0.025 MG tablet Take by mouth.    . Dulaglutide 0.75 MG/0.5ML SOPN Inject 0.75 mg into the skin every Saturday.     . famotidine (PEPCID) 20 MG tablet Take 20 mg by mouth daily.    Marland Kitchen gabapentin (NEURONTIN) 600 MG tablet Take by mouth.    Marland Kitchen glucose blood test strip OneTouch Verio test strips    . hydrOXYzine (ATARAX/VISTARIL) 25 MG tablet TAKE 1 TABLET BY MOUTH EVERY 6 HOURS AS NEEDED 120 tablet 1  . KLOR-CON M20 20 MEQ tablet Take 0.5 tablets (10 mEq total) by mouth 2 (two) times daily. (Patient taking differently: Take 20 mEq by mouth daily. But can take twice a day if needed) 30 tablet 0  . Lancets (ACCU-CHEK SAFE-T PRO) lancets     . levocetirizine (XYZAL) 5 MG tablet Take 5 mg by mouth daily.     Marland Kitchen levothyroxine (SYNTHROID) 50 MCG tablet Take 50 mcg by mouth daily. (269mcg total daily)    . levothyroxine (SYNTHROID, LEVOTHROID) 200 MCG tablet Take 200 mcg by mouth daily. (261mcg total daily)    . Magnesium Oxide 500 MG CAPS Take 1 capsule (500 mg total) by mouth 2 (two) times daily at 8 am and 10 pm. 60 capsule 11  .  methocarbamol (ROBAXIN) 750 MG tablet Take 1 tablet (750 mg total) by mouth every 8 (eight) hours as needed for muscle spasms. 90 tablet 5  . metoprolol succinate (TOPROL-XL) 25 MG 24 hr tablet Take 25 mg by mouth daily.    .  montelukast (SINGULAIR) 10 MG tablet Take 10 mg by mouth daily.     . naproxen (NAPROSYN) 500 MG tablet Take 500 mg by mouth 2 (two) times daily.    . nitroGLYCERIN (NITROSTAT) 0.4 MG SL tablet Place 0.4 mg under the tongue every 5 (five) minutes as needed for chest pain.     Marland Kitchen Olopatadine HCl 0.2 % SOLN     . omeprazole (PRILOSEC) 20 MG capsule Take 20 mg by mouth daily.    . ondansetron (ZOFRAN-ODT) 8 MG disintegrating tablet Take 8 mg by mouth every 8 (eight) hours as needed for nausea or vomiting.    . pregabalin (LYRICA) 150 MG capsule Take 1 capsule (150 mg total) by mouth 2 (two) times daily. 60 capsule 5  . prochlorperazine (COMPAZINE) 10 MG tablet prochlorperazine maleate 10 mg tablet    . promethazine (PHENERGAN) 25 MG tablet Take 25 mg by mouth as needed.    Marland Kitchen QUEtiapine (SEROQUEL) 200 MG tablet Take 1 tablet (200 mg total) by mouth at bedtime. 90 tablet 0  . rizatriptan (MAXALT) 10 MG tablet Take 1 tablet (10 mg total) by mouth as needed. 10 tablet 5  . rOPINIRole (REQUIP) 0.25 MG tablet Take 1 tablet by mouth in the morning, at noon, and at bedtime.    . solifenacin (VESICARE) 5 MG tablet Take by mouth.    . topiramate (TOPAMAX) 50 MG tablet Take 50 mg by mouth 2 (two) times daily.    Marland Kitchen torsemide (DEMADEX) 20 MG tablet Take 1 tablet (20 mg total) by mouth 2 (two) times daily. 60 tablet 0  . traMADol (ULTRAM) 50 MG tablet Take 1-2 tablets (50-100 mg total) by mouth every 6 (six) hours. 240 tablet 5  . TRELEGY ELLIPTA 100-62.5-25 MCG/INH AEPB Inhale 1 puff into the lungs daily.     Marland Kitchen triamterene-hydrochlorothiazide (MAXZIDE-25) 37.5-25 MG tablet Take by mouth.    . VENTOLIN HFA 108 (90 Base) MCG/ACT inhaler Inhale 1-2 puffs into the lungs every 6 (six) hours as needed for wheezing or shortness of breath.     . vitamin E 400 UNIT capsule vitamin e 400 iu synthetic     No current facility-administered medications for this visit.     Musculoskeletal: Strength & Muscle Tone:  UTA Gait & Station: normal Patient leans: N/A  Psychiatric Specialty Exam: Review of Systems  Skin:       Itching  Psychiatric/Behavioral: The patient is nervous/anxious.   All other systems reviewed and are negative.   There were no vitals taken for this visit.There is no height or weight on file to calculate BMI.  General Appearance: Casual  Eye Contact:  Fair  Speech:  Clear and Coherent  Volume:  Normal  Mood:  Anxious  Affect:  Congruent  Thought Process:  Goal Directed and Descriptions of Associations: Intact  Orientation:  Full (Time, Place, and Person)  Thought Content: Logical   Suicidal Thoughts:  No  Homicidal Thoughts:  No  Memory:  Immediate;   Fair Recent;   Fair Remote;   Fair  Judgement:  Poor  Insight:  Shallow  Psychomotor Activity:  Increased  Concentration:  Concentration: Fair and Attention Span: Fair  Recall:  [employer] of Knowledge: Fair  Language: Fair  Akathisia:  No  Handed:  Right  AIMS (if indicated): UTA  Assets:  Communication Skills Desire for Improvement Social Support  ADL's:  Intact  Cognition: WNL  Sleep:  Fair   Screenings: PHQ2-9     Procedure visit from 04/22/2018 in [facility] Procedure visit from 03/13/2018 in [facility] Office Visit from 12/23/2017 in [facility] Procedure visit from 12/05/2017 in [facility] Visit from 11/13/2017 in [facility]  PHQ-2 Total Score  0  0  0  0  0       Assessment and Plan: Karron is a 49 year old Caucasian female who has a history of bipolar disorder, panic attacks, tobacco use disorder, multiple medical problems including chronic back pain was evaluated by telemedicine today.  She continues to struggle with anxiety as well as sensation of itching or crawling over her skin  which she believes is due to anxiety.  Patient advised to follow-up with her primary care provider or get a referral to dermatology.  However will continue to make medication readjustment and also coordinate care with her therapist.  Plan as noted below.  Plan Bipolar disorder-some progress Increase Seroquel to 200 mg p.o. nightly BuSpar 15 mg p.o. twice daily Increase Pristiq to 50 mg p.o. nightly   PTSD-improving Patient to continue to follow up with her therapist Mr. Vonna Kotyk sheets.  Panic attacks-some progress Hydroxyzine 25 mg every 6 hourly as needed for severe anxiety.  Patient advised to limit use  Noncompliance with treatment plan-patient continues to be noncompliant with psychotherapy visits as discussed.  Encouraged her to do so.  I have also coordinated care with her therapist Mr. Vonna Kotyk sheets.  Follow-up in clinic in 3 to 4 weeks or sooner if needed.  I have spent atleast 20 minutes non face to face with patient today. More than 50 % of the time was spent for preparing to see the patient ( e.g., review of test, records ),ordering medications and test ,psychoeducation and supportive psychotherapy and care coordination,as well as documenting clinical information in electronic health record. This note was generated in part or whole with voice recognition software. Voice recognition is usually quite accurate but there are transcription errors that can and very often do occur. I apologize for any typographical errors that were not detected and corrected.      Ursula Alert, MD 05/21/2019, 12:08 PM

## 2019-06-01 ENCOUNTER — Ambulatory Visit (INDEPENDENT_AMBULATORY_CARE_PROVIDER_SITE_OTHER): Payer: Medicare HMO | Admitting: Licensed Clinical Social Worker

## 2019-06-01 DIAGNOSIS — F3160 Bipolar disorder, current episode mixed, unspecified: Secondary | ICD-10-CM | POA: Diagnosis not present

## 2019-06-01 NOTE — Progress Notes (Signed)
Virtual Visit via Video Note  I connected with Marie Clarke on 06/01/19 at 10:00 AM EDT by a video enabled telemedicine application and verified that I am speaking with the correct person using two identifiers.  Location: Patient: Home Provider: Office   I discussed the limitations of evaluation and management by telemedicine and the availability of in person appointments. The patient expressed understanding and agreed to proceed.   THERAPIST PROGRESS NOTE  Session Time: 10:00 am-10:30 am  Participation Level: Active  Behavioral Response: CasualAlertDepressed  Type of Therapy: Individual Therapy  Treatment Goals addressed: Coping  Interventions: CBT and Solution Focused  Case Summary: Marie Clarke is a 49 y.o. female who presents oriented x5 (person, place, situation, time, and object), casually dressed, appropriately groomed, average height, average weight, and cooperative to address mood and anxiety. Patient has a history of medical treatment including congestive heart failure. Patient has a history of mental health treatment including outpatient therapy and medication management. Patient denies current suicidal and homicidal ideations but admits to past suicide attempts. Patient denies psychosis including auditory and visual hallucinations. Patient denies substance abuse. Patient is at low risk for lethality at this time.  Session #2  Physically: Patient has chronic pain. She has a stable appetite. She has hypothyroidism. Patient has been sleeping 7 to 9 hours a night with the CPAP.  Spiritually/values: Patient is a religious person. She has been attending church. 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 She completed some classes to do so.  Relationships: Patient lives with her P[place]an and Barbaraann Rondo. Patient has been arguing less with her Aunt. She also cares for a family member with with dementia who lives with her.  Emotionally/Mentally/Behavior: Patient's mood has been stable. She  feels like she has adjusted to her medication and her mood is improving. Patient used to be irritable toward her aunt. She has been walking away, praying, reading her Bible, and working on keeping her "mouth shut."    Suicidal/Homicidal: Negativewithout intent/plan  Therapist Response: Therapist reviewed patient's recent thoughts and behaviors. Therapist utilized CBT to address mood and anxiety. Therapist processed patient's thoughts to identify triggers. Therapist had patient identify how she has worked on her irritability and updated treatment plan.   Plan: Return again in 1 weeks. Review patient's treatment plan on or before 07.18.21,  Diagnosis: Axis I: Bipolar, Depressed    Axis II: No diagnosis  I discussed the assessment and treatment plan with the patient. The patient was provided an opportunity to ask questions and all were answered. The patient agreed with the plan and demonstrated an understanding of the instructions.   The patient was advised to call back or seek an in-person evaluation if the symptoms worsen or if the condition fails to improve as anticipated.  I provided 30 minutes of non-face-to-face time during this encounter.  Glori Bickers, LCSW 06/01/2019

## 2019-06-13 DIAGNOSIS — G4730 Sleep apnea, unspecified: Secondary | ICD-10-CM

## 2019-06-13 HISTORY — DX: Sleep apnea, unspecified: G47.30

## 2019-06-14 ENCOUNTER — Encounter: Payer: Self-pay | Admitting: Pain Medicine

## 2019-06-15 ENCOUNTER — Telehealth: Payer: Self-pay

## 2019-06-15 NOTE — Telephone Encounter (Signed)
Attempted to call patient, message left. 

## 2019-06-15 NOTE — Telephone Encounter (Signed)
Spoke with Dr. Holley Raring, informed him of patient's history. Suggested she take Tramadol 2 tablets every 6 hours consistently. Also take Alpha Lipoic acid 600 mg daily. Patient notified.

## 2019-06-15 NOTE — Telephone Encounter (Signed)
She states she is having pain her left ankle and wants to know if there is someone filling in for Dr. Dossie Arbour that can do something for her.

## 2019-06-17 ENCOUNTER — Telehealth (INDEPENDENT_AMBULATORY_CARE_PROVIDER_SITE_OTHER): Payer: Medicare HMO | Admitting: Psychiatry

## 2019-06-17 ENCOUNTER — Other Ambulatory Visit: Payer: Self-pay

## 2019-06-17 ENCOUNTER — Encounter: Payer: Self-pay | Admitting: Psychiatry

## 2019-06-17 DIAGNOSIS — F410 Panic disorder [episodic paroxysmal anxiety] without agoraphobia: Secondary | ICD-10-CM

## 2019-06-17 DIAGNOSIS — F3178 Bipolar disorder, in full remission, most recent episode mixed: Secondary | ICD-10-CM | POA: Diagnosis not present

## 2019-06-17 DIAGNOSIS — F4310 Post-traumatic stress disorder, unspecified: Secondary | ICD-10-CM

## 2019-06-17 DIAGNOSIS — Z9119 Patient's noncompliance with other medical treatment and regimen: Secondary | ICD-10-CM

## 2019-06-17 DIAGNOSIS — Z91199 Patient's noncompliance with other medical treatment and regimen due to unspecified reason: Secondary | ICD-10-CM

## 2019-06-17 MED ORDER — BUSPIRONE HCL 15 MG PO TABS: 15.0000 mg | ORAL_TABLET | Freq: Two times a day (BID) | ORAL | 0 refills | Status: DC

## 2019-06-17 MED ORDER — HYDROXYZINE HCL 25 MG PO TABS: 25.0000 mg | ORAL_TABLET | Freq: Four times a day (QID) | ORAL | 0 refills | Status: DC | PRN

## 2019-06-17 NOTE — Progress Notes (Signed)
Provider Location : [facility] Patient Location : Home  Virtual Visit via Video Note  I connected with Marie Clarke on 06/17/19 at  9:30 AM EDT by a video enabled telemedicine application and verified that I am speaking with the correct person using two identifiers.   I discussed the limitations of evaluation and management by telemedicine and the availability of in person appointments. The patient expressed understanding and agreed to proceed.    I discussed the assessment and treatment plan with the patient. The patient was provided an opportunity to ask questions and all were answered. The patient agreed with the plan and demonstrated an understanding of the instructions.   The patient was advised to call back or seek an in-person evaluation if the symptoms worsen or if the condition fails to improve as anticipated.  [facility] [facility] MD OP Progress Note  06/17/2019 12:41 PM Marie Clarke  MRN:  [id number]  Chief Complaint:  Chief Complaint    Follow-up     HPI: Marie Clarke is a 49 year old Caucasian female, single, on SSD, lives in [place], has a history of bipolar disorder, PTSD, panic attacks, coronary artery disease, OSA on CPAP, COPD, hyperlipidemia, hypertension, hypothyroidism, back pain was evaluated by telemedicine today.  Patient today reports she is currently doing well on the current medication regimen.  Since the recent medication changes her anxiety symptoms have improved.  She reports she does not have the sense of crawling on her skin/itching anymore.  She feels more stable.  She reports sleep is good.  She reports appetite is fair.  Patient denies any suicidality, homicidality or perceptual disturbances.  She reports she does have psychosocial stressors of taking care of her family member who has dementia.  Patient however does have support system from her aunt and uncle.  She also continues to be in psychotherapy with her counselor Mr. Vonna Kotyk sheets.  She reports therapy sessions is  beneficial.  She reports her pain is more under control.  She reports if she has any worsening of pain in the future she is planning to get a spinal cord stimulator.  She denies any other concerns today. Visit Diagnosis:    ICD-10-CM   1. Bipolar disorder, in full remission, most recent episode mixed ([place])  F31.78 busPIRone (BUSPAR) 15 MG tablet  2. PTSD (post-traumatic stress disorder)  F43.10 busPIRone (BUSPAR) 15 MG tablet  3. Panic attacks  F41.0 busPIRone (BUSPAR) 15 MG tablet    hydrOXYzine (ATARAX/VISTARIL) 25 MG tablet  4. Noncompliance with treatment regimen  Z91.19     Past Psychiatric History: I have reviewed past psychiatric history from my progress note on 03/06/2017.  Past Medical History:  Past Medical History:  Diagnosis Date  . Anemia   . Anxiety   . Asthma   . Bipolar disorder ([place])   . CAD (coronary artery disease) unk  . CHF (congestive heart failure) ([place])   . COPD (chronic obstructive pulmonary disease) ([place])   . Depression unk  . Diabetes mellitus without complication ([place])   . Diabetes mellitus, type II ([place])   . Drug overdose   . GERD (gastroesophageal reflux disease)   . Headache   . Hyperlipidemia   . Hypertension   . Left leg pain 04/29/2014  . MI (myocardial infarction) ([place])   . Muscle ache 09/16/2014  . Osteoporosis   . Overactive bladder   . Pancreatitis unk  . Reflex sympathetic dystrophy   . Renal insufficiency   . Sleep apnea    pt reported on 2/6/7 she  currently is not using CPAP  . Sleep apnea   . Stroke ([place])   . Thyroid disease   . TIA (transient ischemic attack) unk  . TIA (transient ischemic attack)     Past Surgical History:  Procedure Laterality Date  . ABDOMINAL HYSTERECTOMY    . HERNIA REPAIR  07/15/2017   [facility]  . prolapse rectum surgery N/A July 2016  . TONSILLECTOMY      Family Psychiatric History: Reviewed family psychiatric history from my progress note on 03/06/2017.  Family History:  Family History  Problem  Relation Age of Onset  . Diabetes Mellitus II Mother   . CAD Mother   . Sleep apnea Mother   . Osteoarthritis Mother   . Osteoporosis Mother   . Anxiety disorder Mother   . Depression Mother   . Bipolar disorder Mother   . Bipolar disorder Father   . Hypertension Father   . Depression Father   . Anxiety disorder Father   . Post-traumatic stress disorder Sister     Social History: Reviewed social history from my progress note on 03/06/2017. Social History   Socioeconomic History  . Marital status: Single    Spouse name: Not on file  . Number of children: 0  . Years of education: Not on file  . Highest education level: High school graduate  Occupational History    Comment: not employed  Tobacco Use  . Smoking status: Former Smoker    Packs/day: 0.50    Types: Cigarettes    Quit date: 09/01/2018    Years since quitting: 0.7  . Smokeless tobacco: Never Used  . Tobacco comment: Patient quit smoking 09/01/2018  Substance and Sexual Activity  . Alcohol use: No    Alcohol/week: 0.0 standard drinks  . Drug use: No  . Sexual activity: Not Currently  Other Topics Concern  . Not on file  Social History Narrative  . Not on file   Social Determinants of Health   Financial Resource Strain:   . Difficulty of Paying Living Expenses:   Food Insecurity:   . Worried About Charity fundraiser in the Last Year:   . Arboriculturist in the Last Year:   Transportation Needs:   . Film/video editor (Medical):   Marland Kitchen Lack of Transportation (Non-Medical):   Physical Activity:   . Days of Exercise per Week:   . Minutes of Exercise per Session:   Stress:   . Feeling of Stress :   Social Connections:   . Frequency of Communication with Friends and Family:   . Frequency of Social Gatherings with Friends and Family:   . Attends Religious Services:   . Active Member of Clubs or Organizations:   . Attends Archivist Meetings:   Marland Kitchen Marital Status:     Allergies:  Allergies   Allergen Reactions  . Azithromycin Hives  . Diazepam Hives and Nausea And Vomiting  . Levofloxacin Hives and Rash    1/31: would like to retry since not taking valium  . Lisinopril     Medicine cause kidney failure  . Metronidazole Hives    1/31: would like to retry since she is no longer taking valium  . Sulfa Antibiotics Hives  . Valproic Acid Rash and Itching  . Ziprasidone Hcl Other (See Comments) and Itching    CONFUSED CONFUSED Other reaction(s): Other (See Comments) CONFUSED CONFUSED   . Divalproex Sodium Itching  . Cephalexin Hives  . Ciprofloxacin Hives  . Penicillins  Nausea And Vomiting    Has patient had a PCN reaction causing immediate rash, facial/tongue/throat swelling, SOB or lightheadedness with hypotension: No Has patient had a PCN reaction causing severe rash involving mucus membranes or skin necrosis: No Has patient had a PCN reaction that required hospitalization: No Has patient had a PCN reaction occurring within the last 10 years: No If all of the above answers are "NO", then may proceed with Cephalosporin use.     Metabolic Disorder Labs: Lab Results  Component Value Date   HGBA1C 6.3 (H) 09/29/2018   MPG 134.11 09/29/2018   No results found for: PROLACTIN No results found for: CHOL, TRIG, HDL, CHOLHDL, VLDL, LDLCALC Lab Results  Component Value Date   TSH 2.296 11/17/2018   TSH 3.335 09/29/2018    Therapeutic Level Labs: No results found for: LITHIUM Lab Results  Component Value Date   VALPROATE 33 (L) 09/29/2018   No components found for:  CBMZ  Current Medications: Current Outpatient Medications  Medication Sig Dispense Refill  . Cysteamine Bitartrate (PROCYSBI) 300 MG PACK Use 1 each 3 (three) times daily One Touch Single Use.  Use as instructed.    . fluticasone (FLONASE) 50 MCG/ACT nasal spray Place into the nose.    Marland Kitchen aspirin 81 MG chewable tablet Chew by mouth.    Marland Kitchen atorvastatin (LIPITOR) 40 MG tablet Take 40 mg by mouth at  bedtime.     . B Complex-C (B-COMPLEX WITH VITAMIN C) tablet Take by mouth.    . busPIRone (BUSPAR) 15 MG tablet Take 1 tablet (15 mg total) by mouth 2 (two) times daily. 180 tablet 0  . carvedilol (COREG) 3.125 MG tablet Take by mouth.    . celecoxib (CELEBREX) 100 MG capsule Take 1 capsule (100 mg total) by mouth 2 (two) times daily. 60 capsule 5  . Cholecalciferol (VITAMIN D3) 125 MCG (5000 UT) TABS Take by mouth.    . colchicine (COLCRYS) 0.6 MG tablet Colcrys 0.6 mg tablet    . darifenacin (ENABLEX) 7.5 MG 24 hr tablet Take 7.5 mg by mouth at bedtime.    Marland Kitchen desvenlafaxine (PRISTIQ) 50 MG 24 hr tablet Take 1 tablet (50 mg total) by mouth at bedtime. 90 tablet 0  . dicyclomine (BENTYL) 10 MG capsule dicyclomine 10 mg capsule    . diphenoxylate-atropine (LOMOTIL) 2.5-0.025 MG tablet Take by mouth.    . Dulaglutide 0.75 MG/0.5ML SOPN Inject 0.75 mg into the skin every Saturday.     . famotidine (PEPCID) 20 MG tablet Take 20 mg by mouth daily.    . fluticasone (FLONASE) 50 MCG/ACT nasal spray     . gabapentin (NEURONTIN) 600 MG tablet Take by mouth.    Marland Kitchen glucose blood test strip OneTouch Verio test strips    . hydrOXYzine (ATARAX/VISTARIL) 25 MG tablet Take 1 tablet (25 mg total) by mouth every 6 (six) hours as needed. 360 tablet 0  . KLOR-CON M20 20 MEQ tablet Take 0.5 tablets (10 mEq total) by mouth 2 (two) times daily. (Patient taking differently: Take 20 mEq by mouth daily. But can take twice a day if needed) 30 tablet 0  . Lancets (ACCU-CHEK SAFE-T PRO) lancets     . levocetirizine (XYZAL) 5 MG tablet Take 5 mg by mouth daily.     Marland Kitchen levothyroxine (SYNTHROID) 50 MCG tablet Take 50 mcg by mouth daily. (222mcg total daily)    . levothyroxine (SYNTHROID, LEVOTHROID) 200 MCG tablet Take 200 mcg by mouth daily. (271mcg total daily)    .  Magnesium Oxide 500 MG CAPS Take 1 capsule (500 mg total) by mouth 2 (two) times daily at 8 am and 10 pm. 60 capsule 11  . methocarbamol (ROBAXIN) 750 MG tablet  Take 1 tablet (750 mg total) by mouth every 8 (eight) hours as needed for muscle spasms. 90 tablet 5  . metoprolol succinate (TOPROL-XL) 25 MG 24 hr tablet Take 25 mg by mouth daily.    . montelukast (SINGULAIR) 10 MG tablet Take 10 mg by mouth daily.     . naproxen (NAPROSYN) 500 MG tablet Take 500 mg by mouth 2 (two) times daily.    . naproxen (NAPROSYN) 500 MG tablet Take by mouth.    . nitroGLYCERIN (NITROSTAT) 0.4 MG SL tablet Place 0.4 mg under the tongue every 5 (five) minutes as needed for chest pain.     Marland Kitchen Olopatadine HCl 0.2 % SOLN     . omeprazole (PRILOSEC) 20 MG capsule Take 20 mg by mouth daily.    . ondansetron (ZOFRAN-ODT) 8 MG disintegrating tablet Take 8 mg by mouth every 8 (eight) hours as needed for nausea or vomiting.    . pregabalin (LYRICA) 150 MG capsule Take 1 capsule (150 mg total) by mouth 2 (two) times daily. 60 capsule 5  . prochlorperazine (COMPAZINE) 10 MG tablet prochlorperazine maleate 10 mg tablet    . promethazine (PHENERGAN) 25 MG tablet Take 25 mg by mouth as needed.    Marland Kitchen QUEtiapine (SEROQUEL) 200 MG tablet Take 1 tablet (200 mg total) by mouth at bedtime. 90 tablet 0  . rizatriptan (MAXALT) 10 MG tablet Take 1 tablet (10 mg total) by mouth as needed. 10 tablet 5  . rOPINIRole (REQUIP) 0.25 MG tablet Take 1 tablet by mouth in the morning, at noon, and at bedtime.    . solifenacin (VESICARE) 5 MG tablet Take by mouth.    . topiramate (TOPAMAX) 50 MG tablet Take 50 mg by mouth 2 (two) times daily.    Marland Kitchen torsemide (DEMADEX) 20 MG tablet Take 1 tablet (20 mg total) by mouth 2 (two) times daily. 60 tablet 0  . traMADol (ULTRAM) 50 MG tablet Take 1-2 tablets (50-100 mg total) by mouth every 6 (six) hours. 240 tablet 5  . TRELEGY ELLIPTA 100-62.5-25 MCG/INH AEPB Inhale 1 puff into the lungs daily.     Marland Kitchen triamterene-hydrochlorothiazide (MAXZIDE-25) 37.5-25 MG tablet Take by mouth.    . VENTOLIN HFA 108 (90 Base) MCG/ACT inhaler Inhale 1-2 puffs into the lungs every 6  (six) hours as needed for wheezing or shortness of breath.     . vitamin E 400 UNIT capsule vitamin e 400 iu synthetic     No current facility-administered medications for this visit.     Musculoskeletal: Strength & Muscle Tone: UTA Gait & Station: normal Patient leans: N/A  Psychiatric Specialty Exam: Review of Systems  Psychiatric/Behavioral: Negative for agitation, behavioral problems, confusion, decreased concentration, dysphoric mood, hallucinations, self-injury, sleep disturbance and suicidal ideas. The patient is not nervous/anxious and is not hyperactive.   All other systems reviewed and are negative.   There were no vitals taken for this visit.There is no height or weight on file to calculate BMI.  General Appearance: Casual  Eye Contact:  Fair  Speech:  Clear and Coherent  Volume:  Normal  Mood:  Euthymic  Affect:  Congruent  Thought Process:  Goal Directed and Descriptions of Associations: Intact  Orientation:  Full (Time, Place, and Person)  Thought Content: Logical   Suicidal Thoughts:  No  Homicidal Thoughts:  No  Memory:  Immediate;   Fair Recent;   Fair Remote;   Fair  Judgement:  Fair  Insight:  Fair  Psychomotor Activity:  Normal  Concentration:  Concentration: Fair and Attention Span: Fair  Recall:  [employer] of Knowledge: Fair  Language: Fair  Akathisia:  No  Handed:  Right  AIMS (if indicated): UTA  Assets:  Communication Skills Desire for Improvement Housing Social Support  ADL's:  Intact  Cognition: WNL  Sleep:  Fair   Screenings: PHQ2-9     Procedure visit from 04/22/2018 in [facility] Procedure visit from 03/13/2018 in [facility] Office Visit from 12/23/2017 in [facility] Procedure visit from 12/05/2017 in [facility] Visit from 11/13/2017 in [facility]  PHQ-2 Total Score  0  0  0  0  0       Assessment and Plan: Azareya is a 49 year old Caucasian female who has a history of bipolar disorder, panic attacks, tobacco use disorder, multiple medical problems including chronic back pain was evaluated by telemedicine today.  Patient is currently making progress with regards to her anxiety symptoms and mood.  Plan as noted below.  Plan Bipolar disorder-in remission Seroquel 200 mg p.o. nightly BuSpar 15 mg p.o. twice daily Pristiq 50 mg p.o. nightly  PTSD-stable Continue CBT with Mr. Vonna Kotyk sheets. Continue Pristiq and BuSpar as prescribed  Panic attacks-stable Hydroxyzine 25 mg every 6 hours as needed for severe anxiety.  Noncompliance with treatment plan-patient is currently more compliant.  We will monitor closely  Follow-up in clinic in 6 to 8 weeks or sooner if needed.  I have spent atleast 20 minutes non face to face with patient today. More than 50 % of the time was spent for preparing to see the patient ( e.g., review of test, records ) ordering medications and test ,psychoeducation and supportive psychotherapy and care coordination,as well as documenting clinical information in electronic health record. This note was generated in part or whole with voice recognition software. Voice recognition is usually quite accurate but there are transcription errors that can and very often do occur. I apologize for any typographical errors that were not detected and corrected.       Ursula Alert, MD 06/17/2019, 12:41 PM

## 2019-06-20 ENCOUNTER — Other Ambulatory Visit: Payer: Self-pay | Admitting: Psychiatry

## 2019-06-20 DIAGNOSIS — F4310 Post-traumatic stress disorder, unspecified: Secondary | ICD-10-CM

## 2019-06-20 DIAGNOSIS — F3160 Bipolar disorder, current episode mixed, unspecified: Secondary | ICD-10-CM

## 2019-06-20 DIAGNOSIS — F410 Panic disorder [episodic paroxysmal anxiety] without agoraphobia: Secondary | ICD-10-CM

## 2019-06-25 ENCOUNTER — Encounter: Payer: Self-pay | Admitting: Pain Medicine

## 2019-06-28 NOTE — Progress Notes (Signed)
Patient: Marie Clarke  Service Category: E/M  Provider: Gaspar Cola, MD  DOB: June 07, 1970  DOS: 06/29/2019  Location: Office  MRN: [id number]  Setting: Ambulatory outpatient  Referring Provider: Sharyne Peach, MD  Type: Established Patient  Specialty: Interventional Pain Management  PCP: Sharyne Peach, MD  Location: Remote location  Delivery: TeleHealth     Virtual Encounter - Pain Management PROVIDER NOTE: Information contained herein reflects review and annotations entered in association with encounter. Interpretation of such information and data should be left to medically-trained personnel. Information provided to patient can be located elsewhere in the medical record under "Patient Instructions". Document created using STT-dictation technology, any transcriptional errors that may result from process are unintentional.    Contact & Pharmacy Preferred: [phone] Home: [phone] (home) Mobile: [phone] (mobile) E-mail: [email or web address]  [place], [place] [facility] [place] [address] [place] [address] Phone: [phone] Fax: [phone]   Pre-screening  [name] offered "in-person" vs "virtual" encounter. She indicated preferring virtual for this encounter.   Reason COVID-19*  Social distancing based on CDC and AMA recommendations.   I contacted Marie Clarke on 06/29/2019 via telephone.      I clearly identified myself as Gaspar Cola, MD. I verified that I was speaking with the correct person using two identifiers (Name: Nafeesah Lapaglia, and date of birth: 04/28/1970).  Consent I sought verbal advanced consent from [facility] for virtual visit interactions. I informed [name] of possible security and privacy concerns, risks, and limitations associated with providing "not-in-person" medical evaluation and management services. I also informed [name] of the availability of "in-person" appointments. Finally, I informed her that there  would be a charge for the virtual visit and that she could be  personally, fully or partially, financially responsible for it. Ms. Barletta expressed understanding and agreed to proceed.   Historic Elements   Ms. Antinette Keough is a 49 y.o. year old, female patient evaluated today after her last contact with our practice on 06/15/2019. Ms. Schul  has a past medical history of Anemia, Anxiety, Asthma, Bipolar disorder ([place]), CAD (coronary artery disease) ([place]), CHF (congestive heart failure) ([place]), COPD (chronic obstructive pulmonary disease) ([place]), Depression ([place]), Diabetes mellitus without complication ([place]), Diabetes mellitus, type II ([place]), Drug overdose, GERD (gastroesophageal reflux disease), Headache, Hyperlipidemia, Hypertension, Left leg pain (04/29/2014), MI (myocardial infarction) ([place]), Muscle ache (09/16/2014), Osteoporosis, Overactive bladder, Pancreatitis ([place]), Reflex sympathetic dystrophy, Renal insufficiency, Sleep apnea, Sleep apnea, Stroke ([place]), Thyroid disease, TIA (transient ischemic attack) ([place]), and TIA (transient ischemic attack). She also  has a past surgical history that includes prolapse rectum surgery (N/A, July 2016); Tonsillectomy; Abdominal hysterectomy; and Hernia repair (07/15/2017). Ms. Waterbury has a current medication list which includes the following prescription(s): aspirin, atorvastatin, b-complex with vitamin c, buspirone, carvedilol, [START ON 07/20/2019] celecoxib, vitamin d3, colchicine, procysbi, darifenacin, desvenlafaxine, dicyclomine, diphenoxylate-atropine, dulaglutide, famotidine, fluticasone, fluticasone, gabapentin, glucose blood, hydroxyzine, klor-con m20, accu-chek safe-t pro, levocetirizine, levothyroxine, levothyroxine, magnesium oxide, methocarbamol, metoprolol succinate, montelukast, naproxen, nitroglycerin, olopatadine hcl, omeprazole, ondansetron, pregabalin, prochlorperazine, promethazine, quetiapine, rizatriptan, ropinirole, solifenacin, topiramate, tramadol, trelegy  ellipta, triamterene-hydrochlorothiazide, ventolin hfa, prednisone, torsemide, and vitamin e. She  reports that she quit smoking about 9 months ago. Her smoking use included cigarettes. She smoked 0.50 packs per day. She has never used smokeless tobacco. She reports that she does not drink alcohol or use drugs. Ms. Hosmer is allergic to azithromycin; diazepam; levofloxacin; lisinopril; metronidazole; sulfa antibiotics; valproic acid; ziprasidone hcl; divalproex sodium; cephalexin; ciprofloxacin;  and penicillins.   HPI  Today, she is being contacted for medication management.  Today I had a long conversation with the patient about her long-term care.  Hopefully the solution to her left ankle CRPS chronic pain should be the spinal cord stimulator implant.  She is scheduled to undergo that, several weeks from now.  Unfortunately, she is also having an increase in her left ankle pain and she was interested in seeing what is it that we could do to improve her pain.  However, she is high risk secondary to a prior history of a suicidal attempt using medications.  For this reason, will continue to avoid her request to put her on stronger medication.  She keeps asking about "something else to take".  Today I have reminded her that she is on maximum recommended doses of the tramadol and we will not be going to a different schedule to opioid.  In reviewing her chart, she has had multiple things done for her low back pain and leg pain, but her primary complaint today is that of the left ankle CRPS and the 1 thing that I have noticed is that she did not have evidence of having had a diagnostic left lumbar sympathetic block.  Today I will go will go ahead and schedule this and if she gets good relief with that, she could be a candidate for radiofrequency ablation.  This can be done in conjunction with her spinal cord stimulator.  Today to help with the pain until we can get her in, I have also sent a prescription for a steroid  taper to the pharmacy which I have explained to the patient that she needs to make sure that she completes that to avoid stopping it midway and developing an adrenal suppression.  Pharmacotherapy Assessment  Analgesic: Tramadol 50 mg, 1-2 tab q 6 hrs (200-400 mg/day of tramadol) High-Risk due to history of a prior suicidal attempt w/ medications. MME/day:20-92m/day.   Monitoring: [place] PMP: PDMP reviewed during this encounter.       Pharmacotherapy: No side-effects or adverse reactions reported. Compliance: No problems identified. Effectiveness: Clinically acceptable. Plan: Refer to "POC".  UDS:  Summary  Date Value Ref Range Status  01/22/2018 FINAL  Final    Comment:    ==================================================================== TOXASSURE SELECT 13 (MW) ==================================================================== Test                             Result       Flag       Units Drug Present and Declared for Prescription Verification   Tramadol                       2742         EXPECTED   ng/mg creat   O-Desmethyltramadol            5630         EXPECTED   ng/mg creat   N-Desmethyltramadol            2591         EXPECTED   ng/mg creat    Source of tramadol is a prescription medication.    O-desmethyltramadol and N-desmethyltramadol are expected    metabolites of tramadol. ==================================================================== Test                      Result    Flag   Units  Ref Range   Creatinine              66               mg/dL      >=20 ==================================================================== Declared Medications:  The flagging and interpretation on this report are based on the  following declared medications.  Unexpected results may arise from  inaccuracies in the declared medications.  **Note: The testing scope of this panel includes these medications:  Tramadol  **Note: The testing scope of this panel does not include  following  reported medications:  Acetaminophen  Aspirin (Aspirin 81)  Atorvastatin  Atropine (Lomotil)  Azelastine  Bumetanide  Buspirone  Diphenoxylate (Lomotil)  Fluticasone (Trelegy Ellipta)  Gabapentin  Hydroxyzine (Vistaril)  Lamotrigine  Levocetirizine (Xyzal)  Levothyroxine  Magnesium Oxide  Metformin  Methocarbamol  Mirabegron (Myrbetriq)  Montelukast  Nicotine  Omega-3 Fatty Acids  Ondansetron  Potassium  Potassium (Klor-Con)  Promethazine  Quetiapine  Rizatriptan  Umeclidinium (Trelegy Ellipta)  Vilanterol (Trelegy Ellipta) ==================================================================== For clinical consultation, please call [phone]. ====================================================================    Laboratory Chemistry Profile   Renal Lab Results  Component Value Date   BUN 26 (H) 02/07/2019   CREATININE 1.27 (H) 02/07/2019   BCR 16 09/09/2017   GFRAA 58 (L) 02/07/2019   GFRNONAA 50 (L) 02/07/2019     Hepatic Lab Results  Component Value Date   AST 22 02/07/2019   ALT 18 02/07/2019   ALBUMIN 4.5 02/07/2019   ALKPHOS 107 02/07/2019   LIPASE 39 11/17/2018     Electrolytes Lab Results  Component Value Date   NA 138 02/07/2019   K 3.8 02/07/2019   CL 97 (L) 02/07/2019   CALCIUM 9.0 02/07/2019   MG 1.9 09/09/2017     Bone Lab Results  Component Value Date   25OHVITD1 43 09/09/2017   25OHVITD2 <1.0 09/09/2017   25OHVITD3 43 09/09/2017     Inflammation (CRP: Acute Phase) (ESR: Chronic Phase) Lab Results  Component Value Date   CRP 21 (H) 09/09/2017   ESRSEDRATE 50 (H) 09/09/2017   LATICACIDVEN 1.6 01/11/2019       Note: Above Lab results reviewed.  Imaging  MR THORACIC SPINE WO CONTRAST CLINICAL DATA:  Chronic mid back pain. Nerve damage. Remote history of trauma.  EXAM: MRI THORACIC SPINE WITHOUT CONTRAST  TECHNIQUE: Multiplanar, multisequence MR imaging of the thoracic spine was performed. No  intravenous contrast was administered.  COMPARISON:  None.  FINDINGS: Alignment: No significant listhesis is present. Mild rightward curvature of is present in the midthoracic spine.  Vertebrae: Hemangioma is noted at L2. Mild endplate changes are present in the upper thoracic spine. Marrow signal and vertebral body heights are otherwise normal.  Cord:  Normal signal and morphology.  Paraspinal and other soft tissues: A 10 mm exophytic cyst is noted at the upper pole of the left kidney. The visualized intra-abdominal contents are otherwise normal. Lung bases are clear. Paraspinous musculature is within normal limits.  Disc levels:  T1-2: Negative.  T2-3: Slight disc bulging is present without significant stenosis.  T3-4: A shallow central disc protrusion is present without significant stenosis.  T4-5: Slight disc bulging is present without significant stenosis.  T5-6: A shallow central disc protrusion is present without significant stenosis.  T6-7: A shallow central disc protrusion is present. Partial effacement of the ventral CSF is present without significant stenosis.  T7-8: A central disc protrusion is present. Partial effacement of the ventral CSF is noted. This is the  most significant disc protrusion. No focal stenosis is present.  Slight disc bulges are present at T8-9 and T9-10 without significant stenosis.  No other significant disc disease is present.  Foramina are patent bilaterally.  IMPRESSION: 1. Mild degenerative disc disease in the mid and upper thoracic spine without focal stenosis. The most significant disc protrusion is at T7-8 with partial effacement of the ventral CSF. 2. Mild rightward curvature of the midthoracic spine.  Electronically Signed   By: San Morelle M.D.   On: 05/19/2019 13:09  Assessment  The primary encounter diagnosis was Chronic pain syndrome. 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  Plan of Care  Problem-specific:  No problem-specific Assessment & Plan notes found for this encounter.  Ms. Dynasty Holquin has a current medication list which includes the following long-term medication(s): atorvastatin, carvedilol, [START ON 07/20/2019] celecoxib, colchicine, desvenlafaxine, fluticasone, fluticasone, klor-con m20, levothyroxine, levothyroxine, magnesium oxide, methocarbamol, montelukast, nitroglycerin, pregabalin, quetiapine, rizatriptan, ropinirole, tramadol, triamterene-hydrochlorothiazide, and torsemide.  Pharmacotherapy (Medications Ordered): Meds ordered this encounter  Medications  . celecoxib (CELEBREX) 100 MG capsule    Sig: Take 1 capsule (100 mg total) by mouth 2 (two) times daily.    Dispense:  60 capsule    Refill:  5    Fill one day early if pharmacy is closed on scheduled refill date. May substitute for generic if available.  . predniSONE (DELTASONE) 20 MG tablet    Sig: Take 3 tablets (60 mg total) by mouth daily with breakfast for 3 days, THEN 2 tablets (40 mg total) daily with breakfast for 3 days, THEN 1 tablet (20 mg total) daily with breakfast for 3 days.    Dispense:  18 tablet    Refill:  0   Orders:  Orders Placed This Encounter  Procedures  . LUMBAR SYMPATHETIC BLOCK    For sympathetically-mediated lower extremity pain.    Standing Status:   Future    Standing Expiration Date:   07/29/2019    Scheduling Instructions:     Purpose: Diagnostic     Laterality: Left-sided      Level(s): Lumbar sympathetic chain (L3, L4)     Sedation: Sedation recommended.     Scheduling Timeframe: As permitted by the schedule    Order Specific Question:   Where will this procedure be performed?    Answer:   [facility]  . ToxASSURE Select 13 (MW), Urine    Volume: 30 ml(s). Minimum 3 ml of urine is needed. Document temperature of fresh sample. Indications: Long term (current) use of opiate analgesic (V69.794)    Order Specific Question:   Release to patient    Answer:   Immediate   Follow-up plan:   Return in about 3 months (around 10/07/2019) for (F2F), (MM), in addition, Procedure (w/ sedation): (L) LSB #1, (ASAP).      Interventional management options:  Considering:   NOTE: The patient is medical psychology evaluation indicates that the patient is a "suitable candidate" for a spinal cord stimulator trial. Possible lumbar spinal cord stimulator implant([place] done 04/22/2019.  She  is currently pending implant.)  Diagnosticbilateral lumbar facet block Possible bilateral lumbar facetRFA Diagnostic left L5 TFESI Diagnostic left L5 SNRB Possible left L5 DRG RFA Diagnostic bilateral cervical facet block Possible bilateral cervical facetRFA Diagnosticleft IA shoulder injection Diagnosticleft suprascapular NB Diagnostic left suprascapularRFA Diagnostic left lumbar sympathetic block (LSB) #1  Possible left lumbar sympathetic RFA   Palliative PRN treatment(s):   Therapeutic/palliative left L5 TFESI #2 + right L4-5 LESI #2 (100/100/50/100) Palliative bilateral lumbar facet blocks Palliative right lumbar facet RFA #2 (last done 04/22/2018) Palliative left lumbar facet RFA #2 (last done 03/13/2018)    Recent Visits Date Type Provider Dept  04/22/19 Telemedicine Milinda Pointer, MD [facility]  04/09/19 Procedure visit Milinda Pointer, MD [facility]  04/06/19 Telemedicine Milinda Pointer, MD [facility]   Showing recent visits within past 90 days and meeting all other requirements   Today's Visits Date Type Provider Dept  06/29/19 Telemedicine Milinda Pointer, MD [facility]  Showing today's visits and meeting all other requirements   Future Appointments Date Type Provider Dept  07/20/19 Appointment Milinda Pointer, MD [facility]  09/09/19 Appointment Milinda Pointer, MD [facility]  Showing future appointments within next 90 days and meeting all other requirements   I discussed the assessment and treatment plan with the patient. The patient was provided an opportunity to ask questions and all were answered. The patient agreed with the plan and demonstrated an understanding of the instructions.  Patient advised to call back or seek an in-person evaluation if the symptoms or condition worsens.  Duration of encounter: 16 minutes.  Note by: Gaspar Cola, MD Date: 06/29/2019; Time: 12:42 PM

## 2019-06-29 ENCOUNTER — Ambulatory Visit: Payer: Medicare HMO | Attending: Pain Medicine | Admitting: Pain Medicine

## 2019-06-29 ENCOUNTER — Other Ambulatory Visit: Payer: Self-pay

## 2019-06-29 ENCOUNTER — Telehealth: Payer: Self-pay | Admitting: *Deleted

## 2019-06-29 DIAGNOSIS — M79605 Pain in left leg: Secondary | ICD-10-CM

## 2019-06-29 DIAGNOSIS — G8929 Other chronic pain: Secondary | ICD-10-CM

## 2019-06-29 DIAGNOSIS — M8949 Other hypertrophic osteoarthropathy, multiple sites: Secondary | ICD-10-CM

## 2019-06-29 DIAGNOSIS — M545 Low back pain, unspecified: Secondary | ICD-10-CM

## 2019-06-29 DIAGNOSIS — M25511 Pain in right shoulder: Secondary | ICD-10-CM

## 2019-06-29 DIAGNOSIS — G894 Chronic pain syndrome: Secondary | ICD-10-CM | POA: Diagnosis not present

## 2019-06-29 DIAGNOSIS — M159 Polyosteoarthritis, unspecified: Secondary | ICD-10-CM

## 2019-06-29 DIAGNOSIS — M542 Cervicalgia: Secondary | ICD-10-CM

## 2019-06-29 DIAGNOSIS — G90522 Complex regional pain syndrome I of left lower limb: Secondary | ICD-10-CM

## 2019-06-29 DIAGNOSIS — M1A9XX0 Chronic gout, unspecified, without tophus (tophi): Secondary | ICD-10-CM

## 2019-06-29 DIAGNOSIS — M150 Primary generalized (osteo)arthritis: Secondary | ICD-10-CM

## 2019-06-29 DIAGNOSIS — M25572 Pain in left ankle and joints of left foot: Secondary | ICD-10-CM

## 2019-06-29 DIAGNOSIS — Z79899 Other long term (current) drug therapy: Secondary | ICD-10-CM

## 2019-06-29 DIAGNOSIS — M25512 Pain in left shoulder: Secondary | ICD-10-CM

## 2019-06-29 MED ORDER — CELECOXIB 100 MG PO CAPS: 100.0000 mg | ORAL_CAPSULE | Freq: Two times a day (BID) | ORAL | 5 refills | Status: DC

## 2019-06-29 MED ORDER — PREDNISONE 20 MG PO TABS: ORAL_TABLET | ORAL | 0 refills | Status: AC

## 2019-06-29 NOTE — Patient Instructions (Signed)
____________________________________________________________________________________________  Preparing for Procedure with Sedation  Procedure appointments are limited to planned procedures: . No Prescription Refills. . No disability issues will be discussed. . No medication changes will be discussed.  Instructions: . Oral Intake: Do not eat or drink anything for at least 8 hours prior to your procedure. (Exception: Blood Pressure Medication. See below.) . Transportation: Unless otherwise stated by your physician, you may drive yourself after the procedure. . Blood Pressure Medicine: Do not forget to take your blood pressure medicine with a sip of water the morning of the procedure. If your Diastolic (lower reading)is above 100 mmHg, elective cases will be cancelled/rescheduled. . Blood thinners: These will need to be stopped for procedures. Notify our staff if you are taking any blood thinners. Depending on which one you take, there will be specific instructions on how and when to stop it. . Diabetics on insulin: Notify the staff so that you can be scheduled 1st case in the morning. If your diabetes requires high dose insulin, take only  of your normal insulin dose the morning of the procedure and notify the staff that you have done so. . Preventing infections: Shower with an antibacterial soap the morning of your procedure. . Build-up your immune system: Take 1000 mg of Vitamin C with every meal (3 times a day) the day prior to your procedure. . Antibiotics: Inform the staff if you have a condition or reason that requires you to take antibiotics before dental procedures. . Pregnancy: If you are pregnant, call and cancel the procedure. . Sickness: If you have a cold, fever, or any active infections, call and cancel the procedure. . Arrival: You must be in the facility at least 30 minutes prior to your scheduled procedure. . Children: Do not bring children with you. . Dress appropriately:  Bring dark clothing that you would not mind if they get stained. . Valuables: Do not bring any jewelry or valuables.  Reasons to call and reschedule or cancel your procedure: (Following these recommendations will minimize the risk of a serious complication.) . Surgeries: Avoid having procedures within 2 weeks of any surgery. (Avoid for 2 weeks before or after any surgery). . Flu Shots: Avoid having procedures within 2 weeks of a flu shots or . (Avoid for 2 weeks before or after immunizations). . Barium: Avoid having a procedure within 7-10 days after having had a radiological study involving the use of radiological contrast. (Myelograms, Barium swallow or enema study). . Heart attacks: Avoid any elective procedures or surgeries for the initial 6 months after a "Myocardial Infarction" (Heart Attack). . Blood thinners: It is imperative that you stop these medications before procedures. Let us know if you if you take any blood thinner.  . Infection: Avoid procedures during or within two weeks of an infection (including chest colds or gastrointestinal problems). Symptoms associated with infections include: Localized redness, fever, chills, night sweats or profuse sweating, burning sensation when voiding, cough, congestion, stuffiness, runny nose, sore throat, diarrhea, nausea, vomiting, cold or Flu symptoms, recent or current infections. It is specially important if the infection is over the area that we intend to treat. . Heart and lung problems: Symptoms that may suggest an active cardiopulmonary problem include: cough, chest pain, breathing difficulties or shortness of breath, dizziness, ankle swelling, uncontrolled high or unusually low blood pressure, and/or palpitations. If you are experiencing any of these symptoms, cancel your procedure and contact your primary care physician for an evaluation.  Remember:  Regular Business hours are:    Monday to Thursday 8:00 AM to 4:00 PM  Provider's  Schedule: Kennadi Albany, MD:  Procedure days: Tuesday and Thursday 7:30 AM to 4:00 PM  Bilal Lateef, MD:  Procedure days: Monday and Wednesday 7:30 AM to 4:00 PM ____________________________________________________________________________________________    

## 2019-07-01 ENCOUNTER — Ambulatory Visit (INDEPENDENT_AMBULATORY_CARE_PROVIDER_SITE_OTHER): Payer: Medicare HMO | Admitting: Licensed Clinical Social Worker

## 2019-07-01 DIAGNOSIS — F3178 Bipolar disorder, in full remission, most recent episode mixed: Secondary | ICD-10-CM | POA: Diagnosis not present

## 2019-07-01 NOTE — Progress Notes (Signed)
Virtual Visit via Video Note  I connected with Marie Clarke on 07/01/19 at 10:00 AM EDT by a video enabled telemedicine application and verified that I am speaking with the correct person using two identifiers.  Location: Patient: Home Provider: Office   I discussed the limitations of evaluation and management by telemedicine and the availability of in person appointments. The patient expressed understanding and agreed to proceed.   THERAPIST PROGRESS NOTE  Session Time: 10:20 am-10:40 am  Participation Level: Active  Behavioral Response: CasualAlertDepressed  Type of Therapy: Individual Therapy  Treatment Goals addressed: Coping  Interventions: CBT and Solution Focused  Case Summary: Marie Clarke is a 49 y.o. female who presents oriented x5 (person, place, situation, time, and object), casually dressed, appropriately groomed, average height, average weight, and cooperative to address mood and anxiety. Patient has a history of medical treatment including congestive heart failure. Patient has a history of mental health treatment including outpatient therapy and medication management. Patient denies current suicidal and homicidal ideations but admits to past suicide attempts. Patient denies psychosis including auditory and visual hallucinations. Patient denies substance abuse. Patient is at low risk for lethality at this time.  Session #3  Physically: Patient continues to struggle with back and leg pain. She is managing her pain the best she can. Patient is going to talk to her doctor about getting a stimulator in her back to relieve pain.  Spiritually/values: Patient is a religious person. She is spiritually healthy.   Relationships: Patient is getting along with her Aunt and Uncle. Patient had a death in the family. She lost the person she would sit and help during the day. Patient helped create a slide show for the funeral and is helping cancel services, appointments, etc as well as filling  out paperwork for family related to this loss.  Emotionally/Mentally/Behavior: Patient's mood has been stable.She is not responding in a negative way and is managing her reactions.    Suicidal/Homicidal: Negativewithout intent/plan  Therapist Response: Therapist reviewed patient's recent thoughts and behaviors. Therapist utilized CBT to address mood and anxiety. Therapist processed patient's thoughts to identify triggers. Therapist discussed with patient her recent loss and how she is managing her mood.    Plan: Return again in 1 weeks. Review patient's treatment plan on or before 07.18.21,  Diagnosis: Axis I: Bipolar, Depressed    Axis II: No diagnosis  I discussed the assessment and treatment plan with the patient. The patient was provided an opportunity to ask questions and all were answered. The patient agreed with the plan and demonstrated an understanding of the instructions.   The patient was advised to call back or seek an in-person evaluation if the symptoms worsen or if the condition fails to improve as anticipated.  I provided 20 minutes of non-face-to-face time during this encounter.  Glori Bickers, LCSW 07/01/2019

## 2019-07-08 LAB — TOXASSURE SELECT 13 (MW), URINE

## 2019-07-15 ENCOUNTER — Other Ambulatory Visit: Payer: Self-pay | Admitting: Neurosurgery

## 2019-07-20 ENCOUNTER — Telehealth: Payer: Medicare HMO | Admitting: Pain Medicine

## 2019-07-22 ENCOUNTER — Encounter
Admission: RE | Admit: 2019-07-22 | Discharge: 2019-07-22 | Disposition: A | Discharge status: Home / Self Care | Payer: Medicare HMO | Source: Ambulatory Visit | Attending: Neurosurgery | Admitting: Neurosurgery

## 2019-07-22 ENCOUNTER — Other Ambulatory Visit: Payer: Self-pay

## 2019-07-22 DIAGNOSIS — Z01818 Encounter for other preprocedural examination: Secondary | ICD-10-CM | POA: Insufficient documentation

## 2019-07-22 DIAGNOSIS — Z20822 Contact with and (suspected) exposure to covid-19: Secondary | ICD-10-CM | POA: Insufficient documentation

## 2019-07-22 HISTORY — DX: Chronic pain syndrome: G89.4

## 2019-07-22 HISTORY — DX: Restless legs syndrome: G25.81

## 2019-07-22 HISTORY — DX: Cyst of kidney, acquired: N28.1

## 2019-07-22 HISTORY — DX: Localized edema: R60.0

## 2019-07-22 HISTORY — DX: Angina pectoris, unspecified: I20.9

## 2019-07-22 HISTORY — DX: Post-traumatic stress disorder, unspecified: F43.10

## 2019-07-22 HISTORY — DX: Other intervertebral disc degeneration, lumbar region without mention of lumbar back pain or lower extremity pain: M51.369

## 2019-07-22 HISTORY — DX: Other intervertebral disc degeneration, lumbar region: M51.36

## 2019-07-22 HISTORY — DX: Panic disorder (episodic paroxysmal anxiety): F41.0

## 2019-07-22 HISTORY — DX: Cardiac arrhythmia, unspecified: I49.9

## 2019-07-22 HISTORY — DX: Hypothyroidism, unspecified: E03.9

## 2019-07-22 HISTORY — DX: Unspecified osteoarthritis, unspecified site: M19.90

## 2019-07-22 HISTORY — DX: Suicide attempt, initial encounter: T14.91XA

## 2019-07-22 HISTORY — DX: Fibromyalgia: M79.7

## 2019-07-22 NOTE — Patient Instructions (Addendum)
INSTRUCTIONS FOR SURGERY     Your surgery is scheduled for:   Monday, June 14TH     To find out your arrival time for the day of surgery,          please call [phone] between 1 pm and 3 pm on :  Friday, June 11TH     When you arrive for surgery, report to the [facility].       Do NOT stop on the first floor to register.    REMEMBER: Instructions that are not followed completely may result in serious medical risk,  up to and including death, or upon the discretion of your surgeon and anesthesiologist,            your surgery may need to be rescheduled.  __X__ 1. Do not eat food after midnight the night before your procedure.                    No gum, candy, lozenger, tic tacs, tums or hard candies.                  ABSOLUTELY NOTHING SOLID IN YOUR MOUTH AFTER MIDNIGHT                    You may drink unlimited clear liquids up to 2 hours before you are scheduled to arrive for surgery.                   Do not drink anything within those 2 hours unless you need to take medicine, then take the                   smallest amount you need.  Clear liquids include:  water, apple juice without pulp,                   any flavor Gatorade, Black coffee, black tea.  Sugar may be added but no dairy/ honey /lemon.                        Broth and jello is not considered a clear liquid.  __x__  2. On the morning of surgery, please brush your teeth with toothpaste and water. You may rinse with                  mouthwash if you wish but DO NOT SWALLOW TOOTHPASTE OR MOUTHWASH  __X___3. NO alcohol for 24 hours before or after surgery.  __x___ 4.  Do NOT smoke or use e-cigarettes for 24 HOURS PRIOR TO SURGERY.                      DO NOT Use any chewable tobacco products for at least 6 hours prior to surgery.  __x___ 5. If you start any new medication after this appointment and prior to surgery, please                    Bring it with you on the day of surgery.  ___x__ 6. Notify your doctor if there is any change in  your medical condition, such as fever,                  infection, vomitting, diarrhea or any open sores.  __x___ 7.  USE the CHG SOAP as instructed, the night before surgery and the day of surgery.                   Once you have washed with this soap, do NOT use any of the following: Powders, perfumes                    or lotions. Please do not wear make up, hairpins, clips or nail polish. You may wear deodorant.                   Men may shave their face and neck.  Women need to shave 48 hours prior to surgery.                   DO NOT wear ANY jewelry on the day of surgery. If there are rings that are too tight to                    remove easily, please address this prior to the surgery day. Piercings need to be removed.                                                                     NO METAL ON YOUR BODY.                    Do NOT bring any valuables.  If you came to Pre-Admit testing then you will not need license,                     insurance card or credit card.  If you will be staying overnight, please either leave your things in                     the car or have your family be responsible for these items.                     [facility] IS NOT RESPONSIBLE FOR BELONGINGS OR VALUABLES.  ___X__ 8. DO NOT wear contact lenses on surgery day.  You may not have dentures,                     Hearing aides, contacts or glasses in the operating room. These items can be                    Placed in the Recovery Room to receive immediately after surgery.  __x___ 9. IF YOU ARE SCHEDULED TO GO HOME ON THE SAME DAY, YOU MUST                   Have someone to drive you home and to stay with you  for the first 24 hours.                    Have an arrangement prior to arriving on surgery day.  ___x__ 10. Take the following medications on the  morning of surgery with a sip of water:                               1.SYNTHROID                     2.TOPAMAX                     3.PEPCID                     4.NEURONTIN                     5.BUSPAR                     6.TRAMADOL, if needed  _____ 11.  Follow any instructions provided to you by your surgeon.                        Such as enema, clear liquid bowel prep  __X__  12. STOP ALL ASPIRIN PRODUCTS NOW.  (07/22/19)                       THIS INCLUDES BC POWDERS / GOODIES POWDER  __x___ 13. STOP Anti-inflammatories as of: NOW (07/22/19)                      This includes IBUPROFEN / MOTRIN / ADVIL / ALEVE/ NAPROXYN / MELOXICAM                    YOU MAY TAKE TYLENOL ANY TIME PRIOR TO SURGERY.  __X___ 14.  Stop supplements until after surgery.                     This includes:B COMPLEX // MAGNESIUM // VITAMIN E // MELOXICAM //                  You may continue taking Vitamin B12 / Vitamin D3 but do not take on the morning of surgery.  __X__ 15. Bring your CPAP machine into preop with you on the morning of surgery.  __X____16.  YOU MAY TAKE DULAGLUTIDE INJECTION ON SATURDAY                     Do NOT take any diabetes medications on surgery day.  ______17.  Continue to take the following medications but do not take on the morning of surgery:                        TORSEMIDE // POTASSIUM SUPPLEMENT // ROBAXIN // REQUIP   __X____18. If staying overnight, please have appropriate shoes to wear to be able to walk around the unit.                   Wear clean and comfortable clothing to the hospital.  PLEASE BRING YOUR CPAP. BRING PHONE NUMBERS FOR YOUR CONTACTS. IF YOU USE A CELL PHONE, YOU MIGHT WANT A CHARGER ALSO.

## 2019-07-23 ENCOUNTER — Encounter
Admission: RE | Admit: 2019-07-23 | Discharge: 2019-07-23 | Disposition: A | Discharge status: Home / Self Care | Payer: Medicare HMO | Source: Ambulatory Visit | Attending: Neurosurgery | Admitting: Neurosurgery

## 2019-07-23 DIAGNOSIS — I10 Essential (primary) hypertension: Secondary | ICD-10-CM | POA: Insufficient documentation

## 2019-07-23 DIAGNOSIS — Z01818 Encounter for other preprocedural examination: Secondary | ICD-10-CM | POA: Insufficient documentation

## 2019-07-23 DIAGNOSIS — Z20822 Contact with and (suspected) exposure to covid-19: Secondary | ICD-10-CM | POA: Insufficient documentation

## 2019-07-23 DIAGNOSIS — R000 Tachycardia, unspecified: Secondary | ICD-10-CM | POA: Insufficient documentation

## 2019-07-23 LAB — BASIC METABOLIC PANEL
Anion gap: 13 (ref 5–15)
BUN: 17 mg/dL (ref 6–20)
CO2: 25 mmol/L (ref 22–32)
Calcium: 9.5 mg/dL (ref 8.9–10.3)
Chloride: 101 mmol/L (ref 98–111)
Creatinine, Ser: 0.78 mg/dL (ref 0.44–1.00)
GFR calc Af Amer: >60 mL/min (ref >60)
GFR calc non Af Amer: >60 mL/min (ref >60)
Glucose, Bld: 201 mg/dL — ABNORMAL HIGH (ref 70–99)
Potassium: 3 mmol/L — ABNORMAL LOW (ref 3.5–5.1)
Sodium: 139 mmol/L (ref 135–145)

## 2019-07-23 LAB — CBC
HCT: 39.8 % (ref 36.0–46.0)
Hemoglobin: 13.1 g/dL (ref 12.0–15.0)
MCH: 28.6 pg (ref 26.0–34.0)
MCHC: 32.9 g/dL (ref 30.0–36.0)
MCV: 86.9 fL (ref 80.0–100.0)
Platelets: 262 10*3/uL (ref 150–400)
RBC: 4.58 MIL/uL (ref 3.87–5.11)
RDW: 15.2 % (ref 11.5–15.5)
WBC: 6.9 10*3/uL (ref 4.0–10.5)
nRBC: 0 % (ref 0.0–0.2)

## 2019-07-23 LAB — SURGICAL PCR SCREEN
MRSA, PCR: NEGATIVE
Staphylococcus aureus: POSITIVE — AB

## 2019-07-23 LAB — PROTIME-INR
INR: 1.1 (ref 0.8–1.2)
Prothrombin Time: 13.5 seconds (ref 11.4–15.2)

## 2019-07-23 LAB — TYPE AND SCREEN
ABO/RH(D): A NEG
Antibody Screen: NEGATIVE

## 2019-07-23 LAB — APTT: aPTT: 29 seconds (ref 24–36)

## 2019-07-23 NOTE — Pre-Procedure Instructions (Signed)
Patients potassium today is 3.0. Fax sent to surgeons office to notify and request patient get a supplement. Will recheck DOS.

## 2019-07-24 LAB — SARS CORONAVIRUS 2 (TAT 6-24 HRS): SARS Coronavirus 2: NEGATIVE

## 2019-07-24 NOTE — Pre-Procedure Instructions (Signed)
Per Burna Mortimer RN/Dr Lacinda Axon "No changes. Patient forgot to take her K+ supplement last night (07/22/19). She will make sure she takes it the next few days."  No new orders for +Staph PCR.  DOS I-stat K+ ordered

## 2019-07-27 ENCOUNTER — Encounter
Admission: RE | Disposition: A | Discharge status: Home / Self Care | Payer: Self-pay | Source: Home / Self Care | Attending: Neurosurgery

## 2019-07-27 ENCOUNTER — Other Ambulatory Visit: Payer: Self-pay

## 2019-07-27 ENCOUNTER — Ambulatory Visit
Admission: RE | Admit: 2019-07-27 | Discharge: 2019-07-27 | Disposition: A | Discharge status: Home / Self Care | Payer: Medicare HMO | Attending: Neurosurgery | Admitting: Neurosurgery

## 2019-07-27 ENCOUNTER — Encounter: Payer: Self-pay | Admitting: Neurosurgery

## 2019-07-27 ENCOUNTER — Ambulatory Visit: Payer: Medicare HMO | Admitting: Anesthesiology

## 2019-07-27 ENCOUNTER — Ambulatory Visit: Payer: Medicare HMO

## 2019-07-27 DIAGNOSIS — J439 Emphysema, unspecified: Secondary | ICD-10-CM | POA: Insufficient documentation

## 2019-07-27 DIAGNOSIS — F410 Panic disorder [episodic paroxysmal anxiety] without agoraphobia: Secondary | ICD-10-CM | POA: Diagnosis not present

## 2019-07-27 DIAGNOSIS — G4730 Sleep apnea, unspecified: Secondary | ICD-10-CM | POA: Diagnosis not present

## 2019-07-27 DIAGNOSIS — M797 Fibromyalgia: Secondary | ICD-10-CM | POA: Diagnosis not present

## 2019-07-27 DIAGNOSIS — G9050 Complex regional pain syndrome I, unspecified: Secondary | ICD-10-CM | POA: Diagnosis not present

## 2019-07-27 DIAGNOSIS — N183 Chronic kidney disease, stage 3 unspecified: Secondary | ICD-10-CM | POA: Insufficient documentation

## 2019-07-27 DIAGNOSIS — Z79899 Other long term (current) drug therapy: Secondary | ICD-10-CM | POA: Insufficient documentation

## 2019-07-27 DIAGNOSIS — G894 Chronic pain syndrome: Secondary | ICD-10-CM | POA: Insufficient documentation

## 2019-07-27 DIAGNOSIS — Z79890 Hormone replacement therapy (postmenopausal): Secondary | ICD-10-CM | POA: Insufficient documentation

## 2019-07-27 DIAGNOSIS — F319 Bipolar disorder, unspecified: Secondary | ICD-10-CM | POA: Diagnosis not present

## 2019-07-27 DIAGNOSIS — Z7982 Long term (current) use of aspirin: Secondary | ICD-10-CM | POA: Insufficient documentation

## 2019-07-27 DIAGNOSIS — M5417 Radiculopathy, lumbosacral region: Secondary | ICD-10-CM

## 2019-07-27 DIAGNOSIS — I509 Heart failure, unspecified: Secondary | ICD-10-CM | POA: Diagnosis not present

## 2019-07-27 DIAGNOSIS — E039 Hypothyroidism, unspecified: Secondary | ICD-10-CM | POA: Diagnosis not present

## 2019-07-27 DIAGNOSIS — Z7984 Long term (current) use of oral hypoglycemic drugs: Secondary | ICD-10-CM | POA: Diagnosis not present

## 2019-07-27 DIAGNOSIS — I2510 Atherosclerotic heart disease of native coronary artery without angina pectoris: Secondary | ICD-10-CM | POA: Diagnosis not present

## 2019-07-27 DIAGNOSIS — Z87891 Personal history of nicotine dependence: Secondary | ICD-10-CM | POA: Insufficient documentation

## 2019-07-27 DIAGNOSIS — I130 Hypertensive heart and chronic kidney disease with heart failure and stage 1 through stage 4 chronic kidney disease, or unspecified chronic kidney disease: Secondary | ICD-10-CM | POA: Insufficient documentation

## 2019-07-27 DIAGNOSIS — Z79891 Long term (current) use of opiate analgesic: Secondary | ICD-10-CM | POA: Insufficient documentation

## 2019-07-27 DIAGNOSIS — E785 Hyperlipidemia, unspecified: Secondary | ICD-10-CM | POA: Insufficient documentation

## 2019-07-27 DIAGNOSIS — E1122 Type 2 diabetes mellitus with diabetic chronic kidney disease: Secondary | ICD-10-CM | POA: Diagnosis not present

## 2019-07-27 DIAGNOSIS — D631 Anemia in chronic kidney disease: Secondary | ICD-10-CM | POA: Insufficient documentation

## 2019-07-27 DIAGNOSIS — I252 Old myocardial infarction: Secondary | ICD-10-CM | POA: Diagnosis not present

## 2019-07-27 DIAGNOSIS — G90522 Complex regional pain syndrome I of left lower limb: Secondary | ICD-10-CM

## 2019-07-27 DIAGNOSIS — G2581 Restless legs syndrome: Secondary | ICD-10-CM | POA: Insufficient documentation

## 2019-07-27 DIAGNOSIS — Z8673 Personal history of transient ischemic attack (TIA), and cerebral infarction without residual deficits: Secondary | ICD-10-CM | POA: Diagnosis not present

## 2019-07-27 DIAGNOSIS — G43909 Migraine, unspecified, not intractable, without status migrainosus: Secondary | ICD-10-CM | POA: Diagnosis not present

## 2019-07-27 DIAGNOSIS — Z419 Encounter for procedure for purposes other than remedying health state, unspecified: Secondary | ICD-10-CM

## 2019-07-27 DIAGNOSIS — K219 Gastro-esophageal reflux disease without esophagitis: Secondary | ICD-10-CM | POA: Diagnosis not present

## 2019-07-27 DIAGNOSIS — Z791 Long term (current) use of non-steroidal anti-inflammatories (NSAID): Secondary | ICD-10-CM | POA: Insufficient documentation

## 2019-07-27 HISTORY — PX: THORACIC LAMINECTOMY FOR SPINAL CORD STIMULATOR: SHX6887

## 2019-07-27 LAB — POCT I-STAT, CHEM 8
BUN: 17 mg/dL (ref 6–20)
Calcium, Ion: 1.24 mmol/L (ref 1.15–1.40)
Chloride: 102 mmol/L (ref 98–111)
Creatinine, Ser: 0.9 mg/dL (ref 0.44–1.00)
Glucose, Bld: 169 mg/dL — ABNORMAL HIGH (ref 70–99)
HCT: 33 % — ABNORMAL LOW (ref 36.0–46.0)
Hemoglobin: 11.2 g/dL — ABNORMAL LOW (ref 12.0–15.0)
Potassium: 3.6 mmol/L (ref 3.5–5.1)
Sodium: 139 mmol/L (ref 135–145)
TCO2: 24 mmol/L (ref 22–32)

## 2019-07-27 LAB — URINALYSIS, ROUTINE W REFLEX MICROSCOPIC
Bilirubin Urine: NEGATIVE
Glucose, UA: NEGATIVE mg/dL
Hgb urine dipstick: NEGATIVE
Ketones, ur: NEGATIVE mg/dL
Leukocytes,Ua: NEGATIVE
Nitrite: NEGATIVE
Protein, ur: NEGATIVE mg/dL
Specific Gravity, Urine: 1.021 (ref 1.005–1.030)
pH: 5 (ref 5.0–8.0)

## 2019-07-27 LAB — GLUCOSE, CAPILLARY
Glucose-Capillary: 152 mg/dL — ABNORMAL HIGH (ref 70–99)
Glucose-Capillary: 166 mg/dL — ABNORMAL HIGH (ref 70–99)

## 2019-07-27 SURGERY — THORACIC LAMINECTOMY FOR SPINAL CORD STIMULATOR
Anesthesia: General

## 2019-07-27 MED ORDER — CEFAZOLIN SODIUM-DEXTROSE 2-4 GM/100ML-% IV SOLN
2.0000 g | INTRAVENOUS | Status: AC
  Administered 2019-07-27: 2 g via INTRAVENOUS

## 2019-07-27 MED ORDER — ONDANSETRON HCL 4 MG/2ML IJ SOLN
INTRAMUSCULAR | Status: DC | PRN
  Administered 2019-07-27: 4 mg via INTRAVENOUS

## 2019-07-27 MED ORDER — PHENYLEPHRINE HCL (PRESSORS) 10 MG/ML IV SOLN
INTRAVENOUS | Status: DC | PRN
  Administered 2019-07-27 (×3): 100 ug via INTRAVENOUS

## 2019-07-27 MED ORDER — PROPOFOL 10 MG/ML IV BOLUS
INTRAVENOUS | Status: DC | PRN
  Administered 2019-07-27: 150 mg via INTRAVENOUS
  Administered 2019-07-27: 50 mg via INTRAVENOUS
  Administered 2019-07-27: 40 mg via INTRAVENOUS

## 2019-07-27 MED ORDER — PROPOFOL 500 MG/50ML IV EMUL
INTRAVENOUS | Status: AC
  Filled 2019-07-27: qty 50

## 2019-07-27 MED ORDER — SODIUM CHLORIDE 0.9 % IV SOLN
INTRAVENOUS | Status: DC | PRN
  Administered 2019-07-27: 25 ug/min via INTRAVENOUS

## 2019-07-27 MED ORDER — KETAMINE HCL 50 MG/ML IJ SOLN
INTRAMUSCULAR | Status: AC
  Filled 2019-07-27: qty 10

## 2019-07-27 MED ORDER — PROPOFOL 500 MG/50ML IV EMUL
INTRAVENOUS | Status: DC | PRN
  Administered 2019-07-27: 120 ug/kg/min via INTRAVENOUS

## 2019-07-27 MED ORDER — PHENYLEPHRINE HCL (PRESSORS) 10 MG/ML IV SOLN
INTRAVENOUS | Status: AC
  Filled 2019-07-27: qty 1

## 2019-07-27 MED ORDER — KETAMINE HCL 50 MG/ML IJ SOLN
INTRAMUSCULAR | Status: DC | PRN
  Administered 2019-07-27 (×2): 50 mg via INTRAMUSCULAR

## 2019-07-27 MED ORDER — DEXAMETHASONE SODIUM PHOSPHATE 10 MG/ML IJ SOLN
INTRAMUSCULAR | Status: AC
  Filled 2019-07-27: qty 1

## 2019-07-27 MED ORDER — LIDOCAINE-EPINEPHRINE 1 %-1:100000 IJ SOLN
INTRAMUSCULAR | Status: DC | PRN
  Administered 2019-07-27: 19 mL

## 2019-07-27 MED ORDER — MIDAZOLAM HCL 2 MG/2ML IJ SOLN
INTRAMUSCULAR | Status: AC
  Filled 2019-07-27: qty 2

## 2019-07-27 MED ORDER — CHLORHEXIDINE GLUCONATE 0.12 % MT SOLN: 15.0000 mL | Freq: Once | OROMUCOSAL | Status: AC

## 2019-07-27 MED ORDER — ROCURONIUM BROMIDE 10 MG/ML (PF) SYRINGE
PREFILLED_SYRINGE | INTRAVENOUS | Status: AC
  Filled 2019-07-27: qty 10

## 2019-07-27 MED ORDER — ACETAMINOPHEN 10 MG/ML IV SOLN
INTRAVENOUS | Status: DC | PRN
  Administered 2019-07-27: 1000 mg via INTRAVENOUS

## 2019-07-27 MED ORDER — FENTANYL CITRATE (PF) 100 MCG/2ML IJ SOLN
INTRAMUSCULAR | Status: DC | PRN
  Administered 2019-07-27: 100 ug via INTRAVENOUS
  Administered 2019-07-27: 50 ug via INTRAVENOUS

## 2019-07-27 MED ORDER — FENTANYL CITRATE (PF) 100 MCG/2ML IJ SOLN
INTRAMUSCULAR | Status: AC
  Filled 2019-07-27: qty 2

## 2019-07-27 MED ORDER — VANCOMYCIN HCL 1000 MG IV SOLR
INTRAVENOUS | Status: DC | PRN
  Administered 2019-07-27: 1000 mg via TOPICAL

## 2019-07-27 MED ORDER — OXYCODONE HCL 5 MG PO TABS: 5.0000 mg | ORAL_TABLET | Freq: Four times a day (QID) | ORAL | 0 refills | Status: AC | PRN

## 2019-07-27 MED ORDER — CHLORHEXIDINE GLUCONATE 0.12 % MT SOLN
OROMUCOSAL | Status: AC
  Administered 2019-07-27: 15 mL via OROMUCOSAL
  Filled 2019-07-27: qty 15

## 2019-07-27 MED ORDER — PROPOFOL 10 MG/ML IV BOLUS
INTRAVENOUS | Status: AC
  Filled 2019-07-27: qty 20

## 2019-07-27 MED ORDER — MEPERIDINE HCL 50 MG/ML IJ SOLN: 6.2500 mg | INTRAMUSCULAR | Status: DC | PRN

## 2019-07-27 MED ORDER — PROMETHAZINE HCL 25 MG/ML IJ SOLN: 6.2500 mg | INTRAMUSCULAR | Status: DC | PRN

## 2019-07-27 MED ORDER — CEFAZOLIN SODIUM-DEXTROSE 2-4 GM/100ML-% IV SOLN
INTRAVENOUS | Status: AC
  Filled 2019-07-27: qty 100

## 2019-07-27 MED ORDER — REMIFENTANIL HCL 1 MG IV SOLR
INTRAVENOUS | Status: AC
  Filled 2019-07-27: qty 1000

## 2019-07-27 MED ORDER — LIDOCAINE HCL (CARDIAC) PF 100 MG/5ML IV SOSY
PREFILLED_SYRINGE | INTRAVENOUS | Status: DC | PRN
  Administered 2019-07-27: 50 mg via INTRAVENOUS

## 2019-07-27 MED ORDER — FENTANYL CITRATE (PF) 100 MCG/2ML IJ SOLN
25.0000 ug | INTRAMUSCULAR | Status: DC | PRN
  Administered 2019-07-27: 25 ug via INTRAVENOUS

## 2019-07-27 MED ORDER — OXYCODONE HCL 5 MG PO TABS: 5.0000 mg | ORAL_TABLET | Freq: Once | ORAL | Status: DC | PRN

## 2019-07-27 MED ORDER — METHOCARBAMOL 500 MG PO TABS: 500.0000 mg | ORAL_TABLET | Freq: Three times a day (TID) | ORAL | 0 refills | Status: DC | PRN

## 2019-07-27 MED ORDER — SUGAMMADEX SODIUM 200 MG/2ML IV SOLN
INTRAVENOUS | Status: DC | PRN
  Administered 2019-07-27 (×2): 100 mg via INTRAVENOUS

## 2019-07-27 MED ORDER — SUCCINYLCHOLINE CHLORIDE 20 MG/ML IJ SOLN
INTRAMUSCULAR | Status: DC | PRN
  Administered 2019-07-27: 120 mg via INTRAVENOUS

## 2019-07-27 MED ORDER — ROCURONIUM BROMIDE 100 MG/10ML IV SOLN
INTRAVENOUS | Status: DC | PRN
  Administered 2019-07-27: 10 mg via INTRAVENOUS

## 2019-07-27 MED ORDER — ACETAMINOPHEN 10 MG/ML IV SOLN
INTRAVENOUS | Status: AC
  Filled 2019-07-27: qty 100

## 2019-07-27 MED ORDER — ORAL CARE MOUTH RINSE: 15.0000 mL | Freq: Once | OROMUCOSAL | Status: AC

## 2019-07-27 MED ORDER — SODIUM CHLORIDE 0.9 % IV SOLN
INTRAVENOUS | Status: DC | PRN
  Administered 2019-07-27: .1 ug/kg/min via INTRAVENOUS

## 2019-07-27 MED ORDER — OXYCODONE HCL 5 MG/5ML PO SOLN: 5.0000 mg | Freq: Once | ORAL | Status: DC | PRN

## 2019-07-27 MED ORDER — SODIUM CHLORIDE 0.9 % IV SOLN: INTRAVENOUS | Status: DC

## 2019-07-27 MED ORDER — ONDANSETRON HCL 4 MG/2ML IJ SOLN
INTRAMUSCULAR | Status: AC
  Filled 2019-07-27: qty 2

## 2019-07-27 MED ORDER — SUCCINYLCHOLINE CHLORIDE 200 MG/10ML IV SOSY
PREFILLED_SYRINGE | INTRAVENOUS | Status: AC
  Filled 2019-07-27: qty 10

## 2019-07-27 MED ORDER — THROMBIN 5000 UNITS EX SOLR
CUTANEOUS | Status: DC | PRN
  Administered 2019-07-27: 5000 [IU] via TOPICAL

## 2019-07-27 SURGICAL SUPPLY — 67 items
ACCESSORY KIT ×2 IMPLANT
BUR NEURO DRILL SOFT 3.0X3.8M (BURR) ×2 IMPLANT
CANISTER SUCT 1200ML W/VALVE (MISCELLANEOUS) ×2 IMPLANT
CHLORAPREP W/TINT 26 (MISCELLANEOUS) ×4 IMPLANT
COUNTER NEEDLE 20/40 LG (NEEDLE) ×2 IMPLANT
COVER LIGHT HANDLE STERIS (MISCELLANEOUS) ×4 IMPLANT
COVER WAND RF STERILE (DRAPES) ×2 IMPLANT
CUP MEDICINE 2OZ PLAST GRAD ST (MISCELLANEOUS) ×4 IMPLANT
DERMABOND ADVANCED (GAUZE/BANDAGES/DRESSINGS) ×1
DERMABOND ADVANCED .7 DNX12 (GAUZE/BANDAGES/DRESSINGS) ×1 IMPLANT
DEVICE IMPLANT NEUROSTIMULATOR (Neuro Prosthesis/Implant) ×2 IMPLANT
DRAPE C-ARM 42X72 X-RAY (DRAPES) ×4 IMPLANT
DRAPE C-ARMOR (DRAPES) ×2 IMPLANT
DRAPE INCISE IOBAN 66X45 STRL (DRAPES) ×2 IMPLANT
DRAPE SURG 17X11 SM STRL (DRAPES) ×2 IMPLANT
DRAPE THYROID T SHEET (DRAPES) ×2 IMPLANT
DRSG OPSITE POSTOP 4X6 (GAUZE/BANDAGES/DRESSINGS) IMPLANT
DRSG OPSITE POSTOP 4X8 (GAUZE/BANDAGES/DRESSINGS) IMPLANT
DRSG TEGADERM 4X4.75 (GAUZE/BANDAGES/DRESSINGS) ×4 IMPLANT
DRSG TELFA 4X3 1S NADH ST (GAUZE/BANDAGES/DRESSINGS) ×4 IMPLANT
ELECT CAUTERY BLADE TIP 2.5 (TIP) ×2
ELECTRODE CAUTERY BLDE TIP 2.5 (TIP) ×1 IMPLANT
ENVELOPE ABSORB ANTIBACTERIAL (Neuro Prosthesis/Implant) ×2 IMPLANT
FEE INTRAOP MONITOR IMPULS NCS (MISCELLANEOUS) ×1 IMPLANT
GAUZE 4X4 16PLY RFD (DISPOSABLE) ×2 IMPLANT
GAUZE SPONGE 4X4 12PLY STRL (GAUZE/BANDAGES/DRESSINGS) ×2 IMPLANT
GLOVE BIOGEL PI IND STRL 7.0 (GLOVE) ×1 IMPLANT
GLOVE BIOGEL PI INDICATOR 7.0 (GLOVE) ×1
GLOVE INDICATOR 8.0 STRL GRN (GLOVE) ×2 IMPLANT
GLOVE SURG SYN 7.0 (GLOVE) ×4 IMPLANT
GLOVE SURG SYN 8.0 (GLOVE) ×4 IMPLANT
GOWN STRL REUS W/ TWL XL LVL3 (GOWN DISPOSABLE) ×2 IMPLANT
GOWN STRL REUS W/TWL XL LVL3 (GOWN DISPOSABLE) ×2
GRADUATE 1200CC STRL 31836 (MISCELLANEOUS) ×2 IMPLANT
HEMOSTAT SURGICEL 2X3 (HEMOSTASIS) IMPLANT
INTRAOP MONITOR FEE IMPULS NCS (MISCELLANEOUS) ×1
INTRAOP MONITOR FEE IMPULSE (MISCELLANEOUS) ×1
IV CATH ANGIO 12GX3 LT BLUE (NEEDLE) IMPLANT
KIT ACCESSORY (KITS) ×2 IMPLANT
KIT LEAD (Lead) ×4 IMPLANT
KIT TURNOVER KIT A (KITS) ×2 IMPLANT
LAPSAC SURG PACK 8X10 (MISCELLANEOUS)
MARKER SKIN DUAL TIP RULER LAB (MISCELLANEOUS) ×6 IMPLANT
NEEDLE HYPO 22GX1.5 SAFETY (NEEDLE) ×2 IMPLANT
PACK LAMINECTOMY NEURO (CUSTOM PROCEDURE TRAY) ×2 IMPLANT
PACK SURG LAPSAC 8X10 (MISCELLANEOUS) IMPLANT
PIN MAYFIELD SKULL DISP (PIN) IMPLANT
RECHARGER INTELLIS (NEUROSURGERY SUPPLIES) ×2 IMPLANT
REPROGRAMMER INTELLIS (NEUROSURGERY SUPPLIES) ×2 IMPLANT
SPOGE SURGIFLO 8M (HEMOSTASIS) ×1
SPONGE KITTNER 5P (MISCELLANEOUS) IMPLANT
SPONGE SURGIFLO 8M (HEMOSTASIS) ×1 IMPLANT
STAPLER SKIN PROX 35W (STAPLE) ×2 IMPLANT
SUT BONE WAX W31G (SUTURE) ×2 IMPLANT
SUT ETHILON 3 0 PS 1 (SUTURE) ×6 IMPLANT
SUT NURALON 4 0 TR CR/8 (SUTURE) IMPLANT
SUT POLYSORB 2-0 5X18 GS-10 (SUTURE) ×14 IMPLANT
SUT PROLENE 5 0 RB 2 (SUTURE) IMPLANT
SUT SILK 2 0 SH (SUTURE) ×4 IMPLANT
SUT VIC AB 0 CT1 18XCR BRD 8 (SUTURE) ×2 IMPLANT
SUT VIC AB 0 CT1 8-18 (SUTURE) ×2
SYR 30ML LL (SYRINGE) ×2 IMPLANT
TAPE CLOTH 3X10 WHT NS LF (GAUZE/BANDAGES/DRESSINGS) ×2 IMPLANT
TAPE TRANSPORE STRL 2 31045 (GAUZE/BANDAGES/DRESSINGS) ×2 IMPLANT
TOWEL OR 17X26 4PK STRL BLUE (TOWEL DISPOSABLE) ×4 IMPLANT
TRAY FOLEY MTR SLVR 16FR STAT (SET/KITS/TRAYS/PACK) IMPLANT
TUBING CONNECTING 10 (TUBING) ×2 IMPLANT

## 2019-07-27 NOTE — Anesthesia Preprocedure Evaluation (Signed)
Anesthesia Evaluation  Patient identified by MRN, date of birth, ID band Patient awake    Reviewed: Allergy & Precautions, NPO status , Patient's Chart, lab work & pertinent test results  History of Anesthesia Complications Negative for: history of anesthetic complications  Airway Mallampati: II  TM Distance: >3 FB Neck ROM: Full    Dental  (+) Edentulous Upper, Edentulous Lower   Pulmonary asthma , sleep apnea and Continuous Positive Airway Pressure Ventilation , COPD, former smoker,    breath sounds clear to auscultation- rhonchi (-) wheezing      Cardiovascular hypertension, Pt. on medications (-) angina+ CAD, + Past MI and +CHF  (-) Cardiac Stents and (-) CABG  Rhythm:Regular Rate:Normal - Systolic murmurs and - Diastolic murmurs    Neuro/Psych  Headaches, PSYCHIATRIC DISORDERS Anxiety Depression Bipolar Disorder TIACVA, No Residual Symptoms    GI/Hepatic Neg liver ROS, GERD  ,  Endo/Other  diabetes, Oral Hypoglycemic AgentsHypothyroidism   Renal/GU negative Renal ROS     Musculoskeletal  (+) Arthritis , Fibromyalgia -  Abdominal (+) + obese,   Peds  Hematology  (+) anemia ,   Anesthesia Other Findings Past Medical History: No date: Anemia No date: Anginal pain ([place]) No date: Anxiety No date: Arthritis No date: Asthma No date: Bilateral lower extremity edema No date: Bipolar disorder (HCC) unk: CAD (coronary artery disease) No date: CHF (congestive heart failure) (HCC) No date: Chronic pain syndrome No date: COPD (chronic obstructive pulmonary disease) (HCC)     Comment:  emphysema No date: DDD (degenerative disc disease), lumbar unk: Depression No date: Diabetes mellitus without complication (HCC) No date: Diabetes mellitus, type II ([facility]) 2015: Drug overdose     Comment:  after dad died No date: Dysrhythmia     Comment:  history of tachy arrythmias No date: Fibromyalgia No date: GERD  (gastroesophageal reflux disease) No date: Headache     Comment:  chronic migraines No date: Hyperlipidemia No date: Hypertension No date: Hypothyroidism 04/29/2014: Left leg pain 2007: MI (myocardial infarction) ([place]) 09/16/2014: Muscle ache No date: Osteoporosis No date: Overactive bladder unk: Pancreatitis No date: Panic attacks No date: PTSD (post-traumatic stress disorder) No date: Reflex sympathetic dystrophy No date: Renal cyst, left No date: Renal insufficiency     Comment:  ckd stage iii No date: Restless legs syndrome 06/2019: Sleep apnea     Comment:  uses cpap No date: Sleep apnea 2010: Stroke ([place])     Comment:  TIA x 2. no residual deficits No date: Suicide attempt [facility])     Comment:  after father died. No date: Thyroid disease     Comment:  thyroid nodule unk: TIA (transient ischemic attack) No date: TIA (transient ischemic attack)   Reproductive/Obstetrics                             Anesthesia Physical Anesthesia Plan  ASA: III  Anesthesia Plan: General   Post-op Pain Management:    Induction: Intravenous  PONV Risk Score and Plan: 2 and Ondansetron, Dexamethasone and Midazolam  Airway Management Planned: Oral ETT  Additional Equipment:   Intra-op Plan:   Post-operative Plan: Extubation in OR  Informed Consent: I have reviewed the patients History and Physical, chart, labs and discussed the procedure including the risks, benefits and alternatives for the proposed anesthesia with the patient or authorized representative who has indicated his/her understanding and acceptance.     Dental advisory given  Plan Discussed with: CRNA  and Anesthesiologist  Anesthesia Plan Comments: (Allergy to diazepam, pt denies problems with other benzodiazepines )        Anesthesia Quick Evaluation

## 2019-07-27 NOTE — Transfer of Care (Signed)
Immediate Anesthesia Transfer of Care Note  Patient: Marie Clarke  Procedure(s) Performed: THORACIC SPINAL CORD STIMULATOR PLACEMENT WITH RIGHT FLANK PULSE GENERATOR (N/A )  Patient Location: PACU  Anesthesia Type:General  Level of Consciousness: drowsy and patient cooperative  Airway & Oxygen Therapy: Patient Spontanous Breathing and Patient connected to face mask oxygen  Post-op Assessment: Report given to RN and Post -op Vital signs reviewed and stable  Post vital signs: Reviewed and stable  Last Vitals:  Vitals Value Taken Time  BP 118/93 07/27/19 1018  Temp 36.6 C 07/27/19 1018  Pulse 104 07/27/19 1020  Resp 19 07/27/19 1020  SpO2 100 % 07/27/19 1020  Vitals shown include unvalidated device data.  Last Pain:  Vitals:   07/27/19 1018  TempSrc:   PainSc: (P) 0-No pain         Complications: No complications documented.

## 2019-07-27 NOTE — Op Note (Signed)
Operative Note  SURGERY DATE:07/27/2019  PRE-OP DIAGNOSIS: Chronic pain syndrome  POST-OP DIAGNOSIS:Post-Op Diagnosis Codes: Chronic pain syndrome  Procedure(s) with comments: Bilateral percutaneous spinal cord stimulator lead placement Right flank pulse generator  SURGEON:  * Malen Gauze, MD      Lonell Face - assistant   ANESTHESIA:IV analgesic  OPERATIVE FINDINGS:Successful placement of thoracic spinal cord stimulator leads and right flank pulse generator  Indication Ms. Westwas seen in clinic on6/2 with ongoing back and left > right leg pain. MRI of the spine revealed no concerning stenosis at the level of the implant. The patient underwent a spinal cord stimulator trial previously  and had great relief of her pain.  She therefore wanted to proceed with the permanent implant.Risks including weakness, hematoma, infection, failure of pain relief, post-operative pain, stroke, heart attack, pneumonia, and spinal cord injury were discussed.  We discussed using neuro monitoring to prevent any neurologic deficits.   Procedure The patient was brought to the operating room where vascular access was obtained and she was intubated by the anesthesia service.  Neuro monitoring electrodes were placed and baseline MEP's and SSEPs were normal. She wasplaced prone on gel rolls. Antibiotics were given.Fluoroscopy was used to confirm planned incision inlumbar area at the level of L3 in the midline.  In addition incision was planned in the right flank for the pulse generator placement.  The patient was prepped and draped in a sterile fashion. A hard time out was performed. Local anesthetic was instilled intoplanned incision sites.   The midline lumbar incision was opened and taken to the fascia using cautery.Next, a Touhy needle was used to insert in a paramedian approach to enter the interlaminar space between L2 and L3. Once loss of resistance was identified,  this was confirmed with a metal stylette. Next, the percutaneous lead was passed in the rostral direction using fluoroscopy as guidance to keep in the midline. This was passed without resistance to the level of the T8 vertebral body. Lateral views were obtained to confirm we were in the dorsal epidural space. Next, the same procedure was performed contralaterally placing an additional lead in the midline at the T7 vertebral body, slightly offset to the previous lead to allow for better coverage.We used the programmer and monitoring to ensure these were covering the patients areas of pain in the lower extremities and we were getting bilateral coverage.  The leads were secured with anchors in the fascia.  The incision was irrigated and hemostasis obtained  Next, the right flank incision was opened and taken to the fascia and inferior dissection to allow for a large enough pocket for the placement of the battery.  The incision was irrigated and hemostasis obtained.  Next, a tunneler was used to pass the electrode leads from the lumbar incision to the right flank.  There, the electrodes were attached to the pulse generator and impedances were found to be normal.  The pulse generator was placed into the antibiotic pouch and then placed into the incision taking care to place the wires beneath the pulse generator.  A final fluoroscopic image was taken show good placement ofpercutaneous leads.  Vancomycin powder was placed into the incisions.  Next, a combination of 0, 2-0, and 3-0 Vicryls were used to close the incisions with 3-0 nylon on the skin.   Sterile dressings were applied. The patient was returned to supine positionand the patient was seen to be moving all extremities symmetrically and was taken to PACU for recovery. The family  was updated and all questions answered.   ESTIMATED BLOOD LOSS: 10cc  SPECIMENS None  IMPLANT  KIT LEAD - [id number]  Inventory Item: KIT LEAD Serial no.:   Model/Cat no.: [id number]  Implant name: KIT LEAD - [id number] Laterality: N/A Area: Spine Thoracic  Manufacturer: [employer] Date of Manufacture:    Action: Implanted Number Used: 1   Device Identifier:  Device Identifier Type:    KIT LEAD - [id number]  Inventory Item: KIT LEAD Serial no.:  Model/Cat no.: [id number]  Implant name: KIT LEAD - [id number] Laterality: N/A Area: Spine Thoracic  Manufacturer: [employer] Date of Manufacture:    Action: Implanted Number Used: 1   Device Identifier:  Device Identifier Type:    DEVICE Lucillie Garfinkel [id number]  Inventory Item: DEVICE IMPLANT NEUROSTIMINATOR Serial no.: [id number] Model/Cat no.: [id number]  Implant name: DEVICE Lucillie Garfinkel [id number] Laterality: N/A Area: Spine Thoracic  Manufacturer: [employer] NEUROMOD [employer] MGMT Date of Manufacture:    Action: Implanted Number Used: 1   Device Identifier:  Device Identifier Type:    ENVELOPE ABSORB ANTIBACTERIAL - [id number]  Inventory Item: ENVELOPE ABSORB ANTIBACTERIAL Serial no.:  Model/Cat no.: [id number]  Implant name: ENVELOPE ABSORB ANTIBACTERIAL - [id number] Laterality: N/A Area: Spine Thoracic  Manufacturer: [employer] Date of Manufacture:    Action: Implanted Number Used: 1   Device Identifier:  Device Identifier Type       I performed the case in its entiretywithassistance of Lonell Face, NP  [name], [place]

## 2019-07-27 NOTE — Anesthesia Procedure Notes (Signed)
Procedure Name: Intubation Date/Time: 07/27/2019 7:34 AM Performed by: Jonna Clark, CRNA Pre-anesthesia Checklist: Patient identified, Patient being monitored, Timeout performed, Emergency Drugs available and Suction available Patient Re-evaluated:Patient Re-evaluated prior to induction Oxygen Delivery Method: Circle system utilized Preoxygenation: Pre-oxygenation with 100% oxygen Induction Type: IV induction Ventilation: Mask ventilation without difficulty Laryngoscope Size: 3 and McGraph Grade View: Grade I Tube type: Oral Tube size: 7.0 mm Number of attempts: 1 Airway Equipment and Method: Stylet Placement Confirmation: ETT inserted through vocal cords under direct vision,  positive ETCO2 and breath sounds checked- equal and bilateral Secured at: 22 cm Tube secured with: Tape Dental Injury: Teeth and Oropharynx as per pre-operative assessment

## 2019-07-27 NOTE — Interval H&P Note (Signed)
History and Physical Interval Note:  07/27/2019 6:51 AM  Marie Clarke  has presented today for surgery, with the diagnosis of g89.4 chronic pain syndrome.  The various methods of treatment have been discussed with the patient and family. After consideration of risks, benefits and other options for treatment, the patient has consented to  Procedure(s): [place] (N/A) as a surgical intervention.  The patient's history has been reviewed, patient examined, no change in status, stable for surgery.  I have reviewed the patient's chart and labs.  Questions were answered to the patient's satisfaction.     [name]

## 2019-07-27 NOTE — Anesthesia Postprocedure Evaluation (Signed)
Anesthesia Post Note  Patient: Marie Clarke  Procedure(s) Performed: THORACIC SPINAL CORD STIMULATOR PLACEMENT WITH RIGHT FLANK PULSE GENERATOR (N/A )  Patient location during evaluation: PACU Anesthesia Type: General Level of consciousness: awake and alert and oriented Pain management: pain level controlled Vital Signs Assessment: post-procedure vital signs reviewed and stable Respiratory status: spontaneous breathing, nonlabored ventilation and respiratory function stable Cardiovascular status: blood pressure returned to baseline and stable Postop Assessment: no signs of nausea or vomiting Anesthetic complications: no   No complications documented.   Last Vitals:  Vitals:   07/27/19 1047 07/27/19 1058  BP: (!) 123/98 122/70  Pulse: 97 98  Resp: 17 18  Temp:  (!) 36.1 C  SpO2: 95% 99%    Last Pain:  Vitals:   07/27/19 1058  TempSrc: Temporal  PainSc: 0-No pain                 Mussa Groesbeck

## 2019-07-27 NOTE — H&P (Signed)
Marie Clarke is an 49 y.o. female.   Chief Complaint: Back and leg pain, chronic pain HPI: Marie Clarke returns after having a recent spinal cord stimulator trial performed for back and leg pain. She states she got approximate 60% of relief. She does state the pain returned in full after the trial was removed. Given the amount of relief, she would like to proceed with a permanent stimulator placement.   Past Medical History:  Diagnosis Date  . Anemia   . Anginal pain ([place])   . Anxiety   . Arthritis   . Asthma   . Bilateral lower extremity edema   . Bipolar disorder ([place])   . CAD (coronary artery disease) unk  . CHF (congestive heart failure) ([place])   . Chronic pain syndrome   . COPD (chronic obstructive pulmonary disease) ([facility])    emphysema  . DDD (degenerative disc disease), lumbar   . Depression unk  . Diabetes mellitus without complication ([place])   . Diabetes mellitus, type II ([place])   . Drug overdose 05/14/13   after dad died  . Dysrhythmia    history of tachy arrythmias  . Fibromyalgia   . GERD (gastroesophageal reflux disease)   . Headache    chronic migraines  . Hyperlipidemia   . Hypertension   . Hypothyroidism   . Left leg pain 04/29/2014  . MI (myocardial infarction) ([place]) 05/14/05  . Muscle ache 09/16/2014  . Osteoporosis   . Overactive bladder   . Pancreatitis unk  . Panic attacks   . PTSD (post-traumatic stress disorder)   . Reflex sympathetic dystrophy   . Renal cyst, left   . Renal insufficiency    ckd stage iii  . Restless legs syndrome   . Sleep apnea 06/2019   uses cpap  . Sleep apnea   . Stroke ([place]) 05/14/08   TIA x 2. no residual deficits  . Suicide attempt [facility])    after father died.  . Thyroid disease    thyroid nodule  . TIA (transient ischemic attack) unk  . TIA (transient ischemic attack)     Past Surgical History:  Procedure Laterality Date  . ABDOMINAL HYSTERECTOMY    . CARDIAC CATHETERIZATION    . HERNIA REPAIR  07/15/2017   [facility]  . lumbar facet      several  . prolapse rectum surgery N/A July 2016  . TONSILLECTOMY      Family History  Problem Relation Age of Onset  . Diabetes Mellitus II Mother   . CAD Mother   . Sleep apnea Mother   . Osteoarthritis Mother   . Osteoporosis Mother   . Anxiety disorder Mother   . Depression Mother   . Bipolar disorder Mother   . Bipolar disorder Father   . Hypertension Father   . Depression Father   . Anxiety disorder Father   . Post-traumatic stress disorder Sister    Social History:  reports that she quit smoking about 10 months ago. Her smoking use included cigarettes. She smoked 0.50 packs per day. She has never used smokeless tobacco. She reports that she does not drink alcohol and does not use drugs.  Allergies:  Allergies  Allergen Reactions  . Diazepam Hives and Nausea And Vomiting  . Ziprasidone Hcl Other (See Comments) and Itching    CONFUSED CONFUSED Other reaction(s): Other (See Comments) CONFUSED CONFUSED   . Azithromycin Hives  . Divalproex Sodium Itching  . Levofloxacin Hives and Rash    1/31: would like to  retry since not taking valium  . Lisinopril     Medicine cause kidney failure  . Metronidazole Hives    1/31: would like to retry since she is no longer taking valium  . Sulfa Antibiotics Hives  . Valproic Acid Itching and Rash  . Cephalexin Hives  . Ciprofloxacin Hives  . Penicillins Nausea And Vomiting    Has patient had a PCN reaction causing immediate rash, facial/tongue/throat swelling, SOB or lightheadedness with hypotension: No Has patient had a PCN reaction causing severe rash involving mucus membranes or skin necrosis: No Has patient had a PCN reaction that required hospitalization: No Has patient had a PCN reaction occurring within the last 10 years: No If all of the above answers are "NO", then may proceed with Cephalosporin use.     Medications Prior to Admission  Medication Sig Dispense Refill  . atorvastatin (LIPITOR) 40 MG tablet Take 40  mg by mouth at bedtime.     . busPIRone (BUSPAR) 15 MG tablet Take 1 tablet (15 mg total) by mouth 2 (two) times daily. 180 tablet 0  . darifenacin (ENABLEX) 7.5 MG 24 hr tablet Take 7.5 mg by mouth at bedtime. Use as needed for OAB    . desvenlafaxine (PRISTIQ) 50 MG 24 hr tablet Take 1 tablet (50 mg total) by mouth at bedtime. 90 tablet 0  . dicyclomine (BENTYL) 10 MG capsule as needed.     . diphenoxylate-atropine (LOMOTIL) 2.5-0.025 MG tablet Take by mouth.    . Dulaglutide 0.75 MG/0.5ML SOPN Inject 0.75 mg into the skin every Saturday.     . famotidine (PEPCID) 20 MG tablet Take 20 mg by mouth daily.    Marland Kitchen gabapentin (NEURONTIN) 600 MG tablet Take by mouth 3 (three) times daily.     . hydrOXYzine (ATARAX/VISTARIL) 25 MG tablet Take 1 tablet (25 mg total) by mouth every 6 (six) hours as needed. (Patient taking differently: Take 25 mg by mouth every 6 (six) hours as needed for itching. Primarily for itching) 360 tablet 0  . KLOR-CON M20 20 MEQ tablet Take 0.5 tablets (10 mEq total) by mouth 2 (two) times daily. (Patient taking differently: Take 20 mEq by mouth daily. But can take twice a day if needed) 30 tablet 0  . levocetirizine (XYZAL) 5 MG tablet Take 5 mg by mouth daily. Takes at night    . levothyroxine (SYNTHROID) 50 MCG tablet Take 50 mcg by mouth daily. (253mcg total daily)    . levothyroxine (SYNTHROID, LEVOTHROID) 200 MCG tablet Take 200 mcg by mouth daily. (291mcg total daily)    . methocarbamol (ROBAXIN) 750 MG tablet Take 1 tablet (750 mg total) by mouth every 8 (eight) hours as needed for muscle spasms. 90 tablet 5  . metoprolol succinate (TOPROL-XL) 25 MG 24 hr tablet Take 25 mg by mouth daily. Takes in the evening    . montelukast (SINGULAIR) 10 MG tablet Take 10 mg by mouth daily. Takes in the afternoon    . naproxen (NAPROSYN) 500 MG tablet Take 500 mg by mouth 2 (two) times daily.    . nitroGLYCERIN (NITROSTAT) 0.4 MG SL tablet Place 0.4 mg under the tongue every 5 (five)  minutes as needed for chest pain.     Marland Kitchen Olopatadine HCl 0.2 % SOLN     . omeprazole (PRILOSEC) 20 MG capsule Take 20 mg by mouth daily.    . ondansetron (ZOFRAN-ODT) 8 MG disintegrating tablet Take 8 mg by mouth every 8 (eight) hours as needed for  nausea or vomiting.    . prochlorperazine (COMPAZINE) 10 MG tablet prochlorperazine maleate 10 mg tablet    . promethazine (PHENERGAN) 25 MG tablet Take 25 mg by mouth as needed.    Marland Kitchen QUEtiapine (SEROQUEL) 200 MG tablet Take 1 tablet (200 mg total) by mouth at bedtime. 90 tablet 0  . rOPINIRole (REQUIP) 0.25 MG tablet Take 1 tablet by mouth in the morning, at noon, and at bedtime.    . topiramate (TOPAMAX) 50 MG tablet Take 50 mg by mouth 2 (two) times daily.    . traMADol (ULTRAM) 50 MG tablet Take 1-2 tablets (50-100 mg total) by mouth every 6 (six) hours. 240 tablet 5  . TRELEGY ELLIPTA 100-62.5-25 MCG/INH AEPB Inhale 1 puff into the lungs daily. Takes at night    . VENTOLIN HFA 108 (90 Base) MCG/ACT inhaler Inhale 1-2 puffs into the lungs every 6 (six) hours as needed for wheezing or shortness of breath.     Marland Kitchen aspirin 81 MG chewable tablet Chew by mouth.    . B Complex-C (B-COMPLEX WITH VITAMIN C) tablet Take by mouth.    . carvedilol (COREG) 3.125 MG tablet Take by mouth. (Patient not taking: Reported on 07/27/2019)    . celecoxib (CELEBREX) 100 MG capsule Take 1 capsule (100 mg total) by mouth 2 (two) times daily. 60 capsule 5  . Cholecalciferol (VITAMIN D3) 125 MCG (5000 UT) TABS Take by mouth.    . colchicine (COLCRYS) 0.6 MG tablet as needed. For gout flare up (Patient not taking: Reported on 07/27/2019)    . Cysteamine Bitartrate (PROCYSBI) 300 MG PACK Use 1 each 3 (three) times daily One Touch Single Use.  Use as instructed.    . fluticasone (FLONASE) 50 MCG/ACT nasal spray     . fluticasone (FLONASE) 50 MCG/ACT nasal spray Place into the nose.    Marland Kitchen glucose blood test strip OneTouch Verio test strips    . Lancets (ACCU-CHEK SAFE-T PRO)  lancets     . Magnesium Oxide 500 MG CAPS Take 1 capsule (500 mg total) by mouth 2 (two) times daily at 8 am and 10 pm. (Patient not taking: Reported on 07/22/2019) 60 capsule 11  . pregabalin (LYRICA) 150 MG capsule Take 1 capsule (150 mg total) by mouth 2 (two) times daily. (Patient not taking: Reported on 07/22/2019) 60 capsule 5  . rizatriptan (MAXALT) 10 MG tablet Take 1 tablet (10 mg total) by mouth as needed. 10 tablet 5  . solifenacin (VESICARE) 5 MG tablet Take by mouth. (Patient not taking: Reported on 07/27/2019)    . torsemide (DEMADEX) 20 MG tablet Take 1 tablet (20 mg total) by mouth 2 (two) times daily. 60 tablet 0  . triamterene-hydrochlorothiazide (MAXZIDE-25) 37.5-25 MG tablet Take by mouth. (Patient not taking: Reported on 07/27/2019)    . vitamin E 400 UNIT capsule 400 Units daily.       Results for orders placed or performed during the hospital encounter of 07/27/19 (from the past 48 hour(s))  Glucose, capillary     Status: Abnormal   Collection Time: 07/27/19  6:16 AM  Result Value Ref Range   Glucose-Capillary 152 (H) 70 - 99 mg/dL    Comment: Glucose reference range applies only to samples taken after fasting for at least 8 hours.  Urinalysis, Routine w reflex microscopic     Status: Abnormal   Collection Time: 07/27/19  6:21 AM  Result Value Ref Range   Color, Urine YELLOW (A) YELLOW   APPearance CLEAR (  A) CLEAR   Specific Gravity, Urine 1.021 1.005 - 1.030   pH 5.0 5.0 - 8.0   Glucose, UA NEGATIVE NEGATIVE mg/dL   Hgb urine dipstick NEGATIVE NEGATIVE   Bilirubin Urine NEGATIVE NEGATIVE   Ketones, ur NEGATIVE NEGATIVE mg/dL   Protein, ur NEGATIVE NEGATIVE mg/dL   Nitrite NEGATIVE NEGATIVE   Leukocytes,Ua NEGATIVE NEGATIVE    Comment: Performed at [facility], [address] [place], [place] [address]   No results found.  Review of Systems General ROS: Negative Respiratory ROS: Negative Cardiovascular ROS: Negative Gastrointestinal ROS:  Negative Genito-Urinary ROS: Negative Musculoskeletal ROS: Positive for back pain Neurological ROS: Positive for leg pain Dermatological ROS: Negative   Blood pressure 115/84, pulse (!) 102, temperature (!) 97 F (36.1 C), temperature source Tympanic, resp. rate 12, SpO2 99 %. Physical Exam  General appearance: Alert, cooperative, in no acute distress CV: Regular rate and rhythm Pulm: Clear to auscultation Back: Well-healed puncture sites in the lumbar region  Neurologic exam:  Mental status: alertness: alert, affect: normal Speech: fluent and clear Motor:strength symmetric 5/5 in bilateral lower extremities Sensory: intact to light touch in bilateral lower extremities Gait: normal   Assessment/Plan Chronic pain syndrome  Proceed with thoracic SCS today  [name], MD 07/27/2019, 6:49 AM

## 2019-07-27 NOTE — Interval H&P Note (Signed)
History and Physical Interval Note:  07/27/2019 6:51 AM  Marie Clarke  has presented today for surgery, with the diagnosis of g89.4 chronic pain syndrome.  The various methods of treatment have been discussed with the patient and family. After consideration of risks, benefits and other options for treatment, the patient has consented to  Procedure(s): [place] (N/A) as a surgical intervention.  The patient's history has been reviewed, patient examined, no change in status, stable for surgery.  I have reviewed the patient's chart and labs.  Questions were answered to the patient's satisfaction.     [name]

## 2019-07-27 NOTE — Discharge Summary (Signed)
Procedure: implantation of thoracic spinal cord stimulator Procedure date: 07/27/2019 Diagnosis: Chronic Pain   History: Marie Clarke is s/p permanent implantation of spinal cord stimulator for chronic pain POD: Tolerated procedure well. Evaluated in post op recovery still disoriented from anesthesia but able to answer questions and obey commands.   Physical Exam: Vitals:   07/27/19 1028 07/27/19 1030  BP:    Pulse: 98 100  Resp: 13 19  Temp:    SpO2: 99% 97%    General: Alert and oriented, lying in bed Strength:5/5 throughout  Sensation: intact and symmetric throughout  Skin: Dressings over incision clean, dry, intact  Data:  Recent Labs  Lab 07/23/19 0829 07/23/19 0829 07/27/19 0655  NA 139   < > 139  K 3.0*   < > 3.6  CL 101   < > 102  CO2 25  --   --   BUN 17   < > 17  CREATININE 0.78   < > 0.90  GLUCOSE 201*   < > 169*  CALCIUM 9.5  --   --    < > = values in this interval not displayed.   No results for input(s): AST, ALT, ALKPHOS in the last 168 hours.  Invalid input(s): TBILI   Recent Labs  Lab 07/23/19 0829 07/23/19 0829 07/27/19 0655  WBC 6.9  --   --   HGB 13.1   < > 11.2*  HCT 39.8   < > 33.0*  PLT 262  --   --    < > = values in this interval not displayed.   Recent Labs  Lab 07/23/19 0829  APTT 29  INR 1.1         Assessment/Plan:  Marie Clarke is POD0 s/p Permanent implantation of SCS.   Once she is able to tolerate PO, ambulate, and urinate, she is cleared to home from a surgical standpoint. Discharge instructions shave been given to patient.   Lonell Face, NP Department of Neurosurgery

## 2019-07-27 NOTE — Discharge Instructions (Signed)
AMBULATORY SURGERY  DISCHARGE INSTRUCTIONS   1) The drugs that you were given will stay in your system until tomorrow so for the next 24 hours you should not:  A) Drive an automobile B) Make any legal decisions C) Drink any alcoholic beverage   2) You may resume regular meals tomorrow.  Today it is better to start with liquids and gradually work up to solid foods.  You may eat anything you prefer, but it is better to start with liquids, then soup and crackers, and gradually work up to solid foods.   3) Please notify your doctor immediately if you have any unusual bleeding, trouble breathing, redness and pain at the surgery site, drainage, fever, or pain not relieved by medication.    4) Additional Instructions:        Please contact your physician with any problems or Same Day Surgery at [phone], Monday through Friday 6 am to 4 pm, or [place] at [facility] number at [phone]. Your surgeon has performed an operation on your back to implant a spinal cord stimulator.   Many times, patients feel better immediately after surgery and can "overdo it." Even if you feel well, it is important that you follow these activity guidelines. If you do not let your back heal properly from the surgery, you can increase the chance of the leads moving and/or return of your symptoms. The following are instructions to help in your recovery once you have been discharged from the hospital.  * Do not take anti-inflammatory medications for 3 days after surgery (naproxen [Aleve], ibuprofen [Advil, Motrin], celecoxib [Celebrex], etc.)  Activity   No bending, lifting, or twisting ("BLT"). Avoid lifting objects heavier than 10 pounds (gallon milk jug).  Where possible, avoid household activities that involve lifting, bending, pushing, or pulling such as laundry, vacuuming, grocery shopping, and childcare. Try to arrange for help from friends and family for these activities while your back  heals.  Increase physical activity slowly as tolerated.  Taking short walks is encouraged, but avoid strenuous exercise. Do not jog, run, bicycle, lift weights, or participate in any other exercises unless specifically allowed by your doctor. Avoid prolonged sitting, including car rides.  Talk to your doctor before resuming sexual activity.  You should not drive until cleared by your doctor.  Until released by your doctor, you should not return to work or school.  You should rest at home and let your body heal.   You may shower two days after your surgery.  After showering, lightly dab your incision dry. Do not take a tub bath or go swimming for 3 weeks, or until approved by your doctor at your follow-up appointment.  If you smoke, we strongly recommend that you quit.  Smoking has been proven to interfere with normal healing in your back and will dramatically reduce the success rate of your surgery. Please contact [facility] (800-QUIT-NOW) and use the resources at www.[facility].com for assistance in stopping smoking.  Surgical Incision   If you have a dressing on your incision, you may remove it three days after your surgery. Keep your incision area clean and dry.  If you have staples or stitches on your incision, you should have a follow up scheduled for removal. If you do not have staples or stitches, you will have steri-strips (small pieces of surgical tape) or Dermabond glue. The steri-strips/glue should begin to peel away within about a week (it is fine if the steri-strips fall off before then). If the strips  are still in place one week after your surgery, you may gently remove them.  Diet            You may return to your usual diet. Be sure to stay hydrated.  When to Contact us  Although your surgery and recovery will likely be uneventful, you may have some residual numbness, aches, and pains in your back and/or legs. This is normal and should improve in the next few  weeks.  However, should you experience any of the following, contact us immediately: . New numbness or weakness . Pain that is progressively getting worse, and is not relieved by your pain medications or rest . Bleeding, redness, swelling, pain, or drainage from surgical incision . Chills or flu-like symptoms . Fever greater than 101.0 F (38.3 C) . Problems with bowel or bladder functions . Difficulty breathing or shortness of breath . Warmth, tenderness, or swelling in your calf  Contact Information . During office hours (Monday-Friday 9 am to 5 pm), please call your physician at [phone] . After hours and weekends, please call [phone] and an answering service will put you in touch with either Dr. Lacinda Axon or Dr. Izora Ribas.  . For a life-threatening emergency, call 911

## 2019-07-28 ENCOUNTER — Encounter: Payer: Self-pay | Admitting: Neurosurgery

## 2019-08-03 ENCOUNTER — Ambulatory Visit (INDEPENDENT_AMBULATORY_CARE_PROVIDER_SITE_OTHER): Payer: Medicare HMO | Admitting: Licensed Clinical Social Worker

## 2019-08-03 DIAGNOSIS — F3178 Bipolar disorder, in full remission, most recent episode mixed: Secondary | ICD-10-CM | POA: Diagnosis not present

## 2019-08-03 NOTE — Progress Notes (Signed)
Virtual Visit via Video Note  I connected with Marie Clarke on 08/03/19 at  8:00 AM EDT by a video enabled telemedicine application and verified that I am speaking with the correct person using two identifiers.  Location: Patient: Home Provider: Office   I discussed the limitations of evaluation and management by telemedicine and the availability of in person appointments. The patient expressed understanding and agreed to proceed.   THERAPIST PROGRESS NOTE  Session Time: 8:00 am-8:20 am  Participation Level: Active  Behavioral Response: CasualAlertDepressed  Type of Therapy: Individual Therapy  Treatment Goals addressed: Coping  Interventions: CBT and Solution Focused  Case Summary: Marie Clarke is a 49 y.o. female who presents oriented x5 (person, place, situation, time, and object), casually dressed, appropriately groomed, average height, average weight, and cooperative to address mood and anxiety. Patient has a history of medical treatment including congestive heart failure. Patient has a history of mental health treatment including outpatient therapy and medication management. Patient denies current suicidal and homicidal ideations but admits to past suicide attempts. Patient denies psychosis including auditory and visual hallucinations. Patient denies substance abuse. Patient is at low risk for lethality at this time.  Session #4  Physically: Patient is sleeping well, she has a healthy appetite, and is as active as she can be. Patient is sore from a procedure where she had a stimulator implanted in her back. It has not been turned on yet.  Spiritually/values: Patient went to church the previous day. She is spiritually healthy.  Relationships: Patient is getting along well with others. She misses her uncle's sister who passed away.  Emotionally/Mentally/Behavior: Patient's mood has been stable. She denies anger, anxiety, or depression. Patient feels like the medication has been a huge  difference for her.    Suicidal/Homicidal: Negativewithout intent/plan  Therapist Response: Therapist reviewed patient's recent thoughts and behaviors. Therapist utilized CBT to address mood and anxiety. Therapist processed patient's thoughts to identify triggers. Therapist discussed with patient her improved mood.    Plan: Return again in 1 weeks. Review patient's treatment plan on or before 07.18.21,  Diagnosis: Axis I: Bipolar, Depressed    Axis II: No diagnosis  I discussed the assessment and treatment plan with the patient. The patient was provided an opportunity to ask questions and all were answered. The patient agreed with the plan and demonstrated an understanding of the instructions.   The patient was advised to call back or seek an in-person evaluation if the symptoms worsen or if the condition fails to improve as anticipated.  I provided 20 minutes of non-face-to-face time during this encounter.  Glori Bickers, LCSW 08/03/2019

## 2019-08-05 ENCOUNTER — Encounter: Payer: Self-pay | Admitting: Pain Medicine

## 2019-08-07 ENCOUNTER — Encounter: Payer: Self-pay | Admitting: Physical Therapy

## 2019-08-11 ENCOUNTER — Encounter: Payer: Self-pay | Admitting: Psychiatry

## 2019-08-11 ENCOUNTER — Telehealth (INDEPENDENT_AMBULATORY_CARE_PROVIDER_SITE_OTHER): Payer: Medicare HMO | Admitting: Psychiatry

## 2019-08-11 ENCOUNTER — Other Ambulatory Visit: Payer: Self-pay

## 2019-08-11 DIAGNOSIS — F4310 Post-traumatic stress disorder, unspecified: Secondary | ICD-10-CM

## 2019-08-11 DIAGNOSIS — F3178 Bipolar disorder, in full remission, most recent episode mixed: Secondary | ICD-10-CM | POA: Diagnosis not present

## 2019-08-11 DIAGNOSIS — F17200 Nicotine dependence, unspecified, uncomplicated: Secondary | ICD-10-CM | POA: Diagnosis not present

## 2019-08-11 DIAGNOSIS — F410 Panic disorder [episodic paroxysmal anxiety] without agoraphobia: Secondary | ICD-10-CM | POA: Diagnosis not present

## 2019-08-11 MED ORDER — QUETIAPINE FUMARATE 200 MG PO TABS: 200.0000 mg | ORAL_TABLET | Freq: Every day | ORAL | 0 refills | Status: DC

## 2019-08-11 MED ORDER — DESVENLAFAXINE SUCCINATE ER 50 MG PO TB24: 50.0000 mg | ORAL_TABLET | Freq: Every day | ORAL | 0 refills | Status: DC

## 2019-08-11 NOTE — Progress Notes (Signed)
Provider Location : [facility] Patient Location : Home  Virtual Visit via Video Note  I connected with Marie Clarke on 08/11/19 at 10:00 AM EDT by a video enabled telemedicine application and verified that I am speaking with the correct person using two identifiers.   I discussed the limitations of evaluation and management by telemedicine and the availability of in person appointments. The patient expressed understanding and agreed to proceed.   I discussed the assessment and treatment plan with the patient. The patient was provided an opportunity to ask questions and all were answered. The patient agreed with the plan and demonstrated an understanding of the instructions.   The patient was advised to call back or seek an in-person evaluation if the symptoms worsen or if the condition fails to improve as anticipated.  [facility] MD OP Progress Note  08/11/2019 10:24 AM Merri Dimaano  MRN:  [id number]  Chief Complaint:  Chief Complaint    Follow-up     HPI: Marie Clarke is a 49 year old Caucasian female, single, , lives in [place], has a history of bipolar disorder, PTSD, panic attacks, coronary artery disease, OSA on CPAP, COPD, hyperlipidemia, hypertension, hypothyroidism, back pain was evaluated by telemedicine today.  Patient today reports she is currently grieving the loss of her aunt who passed away from dementia recently.  She was taking care of of this aunt along with her other aunt and uncle.  She reports she used to read out to her every day and now that she is gone she misses her a lot.  Patient became tearful when she discussed this.  She however reports she has been working in therapy and her therapist seems to believe she is coping well.  Patient reports mood wise she is doing okay otherwise.  She denies any significant mood lability or panic attacks.  She reports sleep as good.  Patient denies any suicidality, homicidality or perceptual disturbances.  She reports she had back surgery and  spinal stimulator placement recently.  She reports she may have to go for a disability review however she does not think she can work due to her history of panic symptoms and PTSD.  She wonders what she will do if she has these panic attacks when she goes back to work.  She clearly does not think she can function like that.  Patient denies any other concerns today.  Visit Diagnosis:    ICD-10-CM   1. Bipolar disorder, in full remission, most recent episode mixed ([place])  F31.78   2. PTSD (post-traumatic stress disorder)  F43.10 QUEtiapine (SEROQUEL) 200 MG tablet    desvenlafaxine (PRISTIQ) 50 MG 24 hr tablet  3. Panic attacks  F41.0 QUEtiapine (SEROQUEL) 200 MG tablet    desvenlafaxine (PRISTIQ) 50 MG 24 hr tablet  4. Tobacco use disorder  F17.200     Past Psychiatric History: I have reviewed past psychiatric history from my progress note on 03/06/2017.  Past Medical History:  Past Medical History:  Diagnosis Date  . Anemia   . Anginal pain ([place])   . Anxiety   . Arthritis   . Asthma   . Bilateral lower extremity edema   . Bipolar disorder ([place])   . CAD (coronary artery disease) unk  . CHF (congestive heart failure) ([place])   . Chronic pain syndrome   . COPD (chronic obstructive pulmonary disease) ([facility])    emphysema  . DDD (degenerative disc disease), lumbar   . Depression unk  . Diabetes mellitus without complication ([place])   .  Diabetes mellitus, type II ([place])   . Drug overdose 07-May-2013   after dad died  . Dysrhythmia    history of tachy arrythmias  . Fibromyalgia   . GERD (gastroesophageal reflux disease)   . Headache    chronic migraines  . Hyperlipidemia   . Hypertension   . Hypothyroidism   . Left leg pain 04/29/2014  . MI (myocardial infarction) ([place]) 05/07/2005  . Muscle ache 09/16/2014  . Osteoporosis   . Overactive bladder   . Pancreatitis unk  . Panic attacks   . PTSD (post-traumatic stress disorder)   . Reflex sympathetic dystrophy   . Renal cyst, left   . Renal  insufficiency    ckd stage iii  . Restless legs syndrome   . Sleep apnea 06/2019   uses cpap  . Sleep apnea   . Stroke ([place]) 05/07/08   TIA x 2. no residual deficits  . Suicide attempt [facility])    after father died.  . Thyroid disease    thyroid nodule  . TIA (transient ischemic attack) unk  . TIA (transient ischemic attack)     Past Surgical History:  Procedure Laterality Date  . ABDOMINAL HYSTERECTOMY    . CARDIAC CATHETERIZATION    . HERNIA REPAIR  07/15/2017   [facility]  . lumbar facet     several  . prolapse rectum surgery N/A July 2016  . THORACIC LAMINECTOMY FOR SPINAL CORD STIMULATOR N/A 07/27/2019   Procedure: THORACIC SPINAL CORD STIMULATOR PLACEMENT WITH RIGHT FLANK PULSE GENERATOR;  Surgeon: [name], MD;  Location: [facility] ORS;  Service: Neurosurgery;  Laterality: N/A;  . TONSILLECTOMY      Family Psychiatric History: I have reviewed family psychiatric history from my progress note on 03/06/2017.  Family History:  Family History  Problem Relation Age of Onset  . Diabetes Mellitus II Mother   . CAD Mother   . Sleep apnea Mother   . Osteoarthritis Mother   . Osteoporosis Mother   . Anxiety disorder Mother   . Depression Mother   . Bipolar disorder Mother   . Bipolar disorder Father   . Hypertension Father   . Depression Father   . Anxiety disorder Father   . Post-traumatic stress disorder Sister     Social History: I have reviewed social history from my progress note on 03/06/2017. Social History   Socioeconomic History  . Marital status: Single    Spouse name: Not on file  . Number of children: 0  . Years of education: Not on file  . Highest education level: High school graduate  Occupational History    Comment: not employed  Tobacco Use  . Smoking status: Former Smoker    Packs/day: 0.50    Types: Cigarettes    Quit date: 09/01/2018    Years since quitting: 0.9  . Smokeless tobacco: Never Used  . Tobacco comment: Patient quit smoking 09/01/2018  Vaping  Use  . Vaping Use: Never used  Substance and Sexual Activity  . Alcohol use: No    Alcohol/week: 0.0 standard drinks  . Drug use: No  . Sexual activity: Not Currently  Other Topics Concern  . Not on file  Social History Narrative   Patient lives with aunt and uncle.   Social Determinants of Health   Financial Resource Strain:   . Difficulty of Paying Living Expenses:   Food Insecurity:   . Worried About Charity fundraiser in the Last Year:   . Arboriculturist in  the Last Year:   Transportation Needs:   . Film/video editor (Medical):   Marland Kitchen Lack of Transportation (Non-Medical):   Physical Activity:   . Days of Exercise per Week:   . Minutes of Exercise per Session:   Stress:   . Feeling of Stress :   Social Connections:   . Frequency of Communication with Friends and Family:   . Frequency of Social Gatherings with Friends and Family:   . Attends Religious Services:   . Active Member of Clubs or Organizations:   . Attends Archivist Meetings:   Marland Kitchen Marital Status:     Allergies:  Allergies  Allergen Reactions  . Diazepam Hives and Nausea And Vomiting  . Ziprasidone Hcl Other (See Comments) and Itching    CONFUSED CONFUSED Other reaction(s): Other (See Comments) CONFUSED CONFUSED   . Azithromycin Hives  . Divalproex Sodium Itching  . Levofloxacin Hives and Rash    1/31: would like to retry since not taking valium  . Lisinopril     Medicine cause kidney failure  . Metronidazole Hives    1/31: would like to retry since she is no longer taking valium  . Sulfa Antibiotics Hives  . Valproic Acid Itching and Rash  . Cephalexin Hives  . Ciprofloxacin Hives  . Penicillins Nausea And Vomiting    Has patient had a PCN reaction causing immediate rash, facial/tongue/throat swelling, SOB or lightheadedness with hypotension: No Has patient had a PCN reaction causing severe rash involving mucus membranes or skin necrosis: No Has patient had a PCN reaction that  required hospitalization: No Has patient had a PCN reaction occurring within the last 10 years: No If all of the above answers are "NO", then may proceed with Cephalosporin use.     Metabolic Disorder Labs: Lab Results  Component Value Date   HGBA1C 6.3 (H) 09/29/2018   MPG 134.11 09/29/2018   No results found for: PROLACTIN No results found for: CHOL, TRIG, HDL, CHOLHDL, VLDL, LDLCALC Lab Results  Component Value Date   TSH 2.296 11/17/2018   TSH 3.335 09/29/2018    Therapeutic Level Labs: No results found for: LITHIUM Lab Results  Component Value Date   VALPROATE 33 (L) 09/29/2018   No components found for:  CBMZ  Current Medications: Current Outpatient Medications  Medication Sig Dispense Refill  . celecoxib (CELEBREX) 100 MG capsule Take by mouth.    . oxyCODONE (OXY IR/ROXICODONE) 5 MG immediate release tablet Take by mouth.    Marland Kitchen atorvastatin (LIPITOR) 40 MG tablet Take 40 mg by mouth at bedtime.     . B Complex-C (B-COMPLEX WITH VITAMIN C) tablet Take by mouth.    . busPIRone (BUSPAR) 15 MG tablet Take 1 tablet (15 mg total) by mouth 2 (two) times daily. 180 tablet 0  . carvedilol (COREG) 3.125 MG tablet Take by mouth. (Patient not taking: Reported on 07/27/2019)    . Cholecalciferol (VITAMIN D3) 125 MCG (5000 UT) TABS Take by mouth.    . colchicine (COLCRYS) 0.6 MG tablet as needed. For gout flare up (Patient not taking: Reported on 07/27/2019)    . Cysteamine Bitartrate (PROCYSBI) 300 MG PACK Use 1 each 3 (three) times daily One Touch Single Use.  Use as instructed.    . darifenacin (ENABLEX) 7.5 MG 24 hr tablet Take 7.5 mg by mouth at bedtime. Use as needed for OAB    . desvenlafaxine (PRISTIQ) 50 MG 24 hr tablet Take 1 tablet (50 mg total) by mouth at  bedtime. 90 tablet 0  . dicyclomine (BENTYL) 10 MG capsule as needed.     . diphenoxylate-atropine (LOMOTIL) 2.5-0.025 MG tablet Take by mouth.    . Dulaglutide 0.75 MG/0.5ML SOPN Inject 0.75 mg into the skin every  Saturday.     . famotidine (PEPCID) 20 MG tablet Take 20 mg by mouth daily.    . fluticasone (FLONASE) 50 MCG/ACT nasal spray     . fluticasone (FLONASE) 50 MCG/ACT nasal spray Place into the nose.    . gabapentin (NEURONTIN) 600 MG tablet Take by mouth 3 (three) times daily.     Marland Kitchen glucose blood test strip OneTouch Verio test strips    . HYDROcodone-acetaminophen (NORCO/VICODIN) 5-325 MG tablet Take 1 tablet by mouth every 6 (six) hours as needed.    . hydrOXYzine (ATARAX/VISTARIL) 25 MG tablet Take 1 tablet (25 mg total) by mouth every 6 (six) hours as needed. (Patient taking differently: Take 25 mg by mouth every 6 (six) hours as needed for itching. Primarily for itching) 360 tablet 0  . KLOR-CON M20 20 MEQ tablet Take 0.5 tablets (10 mEq total) by mouth 2 (two) times daily. (Patient taking differently: Take 20 mEq by mouth daily. But can take twice a day if needed) 30 tablet 0  . Lancets (ACCU-CHEK SAFE-T PRO) lancets     . levocetirizine (XYZAL) 5 MG tablet Take 5 mg by mouth daily. Takes at night    . levothyroxine (SYNTHROID) 50 MCG tablet Take 50 mcg by mouth daily. (279mcg total daily)    . levothyroxine (SYNTHROID, LEVOTHROID) 200 MCG tablet Take 200 mcg by mouth daily. (213mcg total daily)    . Magnesium Oxide 500 MG CAPS Take 1 capsule (500 mg total) by mouth 2 (two) times daily at 8 am and 10 pm. (Patient not taking: Reported on 07/22/2019) 60 capsule 11  . methocarbamol (ROBAXIN) 500 MG tablet     . methocarbamol (ROBAXIN) 750 MG tablet Take 1 tablet (750 mg total) by mouth every 8 (eight) hours as needed for muscle spasms. 90 tablet 5  . metoprolol succinate (TOPROL-XL) 25 MG 24 hr tablet Take 25 mg by mouth daily. Takes in the evening    . montelukast (SINGULAIR) 10 MG tablet Take 10 mg by mouth daily. Takes in the afternoon    . nitroGLYCERIN (NITROSTAT) 0.4 MG SL tablet Place 0.4 mg under the tongue every 5 (five) minutes as needed for chest pain.     Marland Kitchen Olopatadine HCl 0.2 % SOLN      . omeprazole (PRILOSEC) 20 MG capsule Take 20 mg by mouth daily.    . ondansetron (ZOFRAN-ODT) 8 MG disintegrating tablet Take 8 mg by mouth every 8 (eight) hours as needed for nausea or vomiting.    . pregabalin (LYRICA) 150 MG capsule Take 1 capsule (150 mg total) by mouth 2 (two) times daily. (Patient not taking: Reported on 07/22/2019) 60 capsule 5  . prochlorperazine (COMPAZINE) 10 MG tablet prochlorperazine maleate 10 mg tablet    . promethazine (PHENERGAN) 25 MG tablet Take 25 mg by mouth as needed.    Marland Kitchen QUEtiapine (SEROQUEL) 200 MG tablet Take 1 tablet (200 mg total) by mouth at bedtime. 90 tablet 0  . rizatriptan (MAXALT) 10 MG tablet Take 1 tablet (10 mg total) by mouth as needed. 10 tablet 5  . rOPINIRole (REQUIP) 0.25 MG tablet Take 1 tablet by mouth in the morning, at noon, and at bedtime.    . solifenacin (VESICARE) 5 MG tablet Take by  mouth. (Patient not taking: Reported on 07/27/2019)    . topiramate (TOPAMAX) 50 MG tablet Take 50 mg by mouth 2 (two) times daily.    Marland Kitchen torsemide (DEMADEX) 20 MG tablet Take 1 tablet (20 mg total) by mouth 2 (two) times daily. 60 tablet 0  . TRELEGY ELLIPTA 100-62.5-25 MCG/INH AEPB Inhale 1 puff into the lungs daily. Takes at night    . triamterene-hydrochlorothiazide (MAXZIDE-25) 37.5-25 MG tablet Take by mouth. (Patient not taking: Reported on 07/27/2019)    . VENTOLIN HFA 108 (90 Base) MCG/ACT inhaler Inhale 1-2 puffs into the lungs every 6 (six) hours as needed for wheezing or shortness of breath.     . vitamin E 400 UNIT capsule 400 Units daily.      No current facility-administered medications for this visit.     Musculoskeletal: Strength & Muscle Tone: UTA Gait & Station: normal Patient leans: N/A  Psychiatric Specialty Exam: Review of Systems  Psychiatric/Behavioral: Negative for agitation, behavioral problems, confusion, decreased concentration, dysphoric mood, hallucinations, self-injury, sleep disturbance and suicidal ideas. The  patient is not nervous/anxious and is not hyperactive.   All other systems reviewed and are negative.   There were no vitals taken for this visit.There is no height or weight on file to calculate BMI.  General Appearance: Casual  Eye Contact:  Fair  Speech:  Clear and Coherent  Volume:  Normal  Mood:  Euthymic  Affect:  Congruent  Thought Process:  Goal Directed and Descriptions of Associations: Intact  Orientation:  Full (Time, Place, and Person)  Thought Content: Logical   Suicidal Thoughts:  No  Homicidal Thoughts:  No  Memory:  Immediate;   Fair Recent;   Fair Remote;   Fair  Judgement:  Fair  Insight:  Fair  Psychomotor Activity:  Normal  Concentration:  Concentration: Fair and Attention Span: Fair  Recall:  [employer] of Knowledge: Fair  Language: Fair  Akathisia:  No  Handed:  Right  AIMS (if indicated): UTA  Assets:  Communication Skills Desire for Improvement Housing Social Support  ADL's:  Intact  Cognition: WNL  Sleep:  Fair   Screenings: PHQ2-9     Procedure visit from 04/22/2018 in [facility] Procedure visit from 03/13/2018 in [place] [facility] Office Visit from 12/23/2017 in [place] Procedure visit from 12/05/2017 in [place] [facility] Visit from 11/13/2017 in [facility]  PHQ-2 Total Score 0 0 0 0 0       Assessment and Plan: Eddis Pingleton is a 49 year old Caucasian female who has a history of bipolar disorder, panic attacks, multiple medical problems including chronic back pain was evaluated by telemedicine today.  Patient is currently stable on current medication regimen.  She does have psychosocial stressors of recent death in the family, her own health problems including surgery.  She however will continue to benefit from medications and  psychotherapy sessions.  Plan Bipolar disorder in remission Seroquel 200 mg p.o. nightly BuSpar 15 mg p.o. twice daily Pristiq 50 mg p.o. nightly  PTSD-stable Continue CBT with Mr. Vonna Kotyk sheets Pristiq and BuSpar as prescribed  Panic attacks-stable Hydroxyzine 25 mg every 6 hours as needed for severe anxiety.  Follow-up in clinic in 8 weeks or sooner if needed.  I have spent atleast 20 minutes non face to face with patient today. More than 50 % of the time  was spent for preparing to see the patient ( e.g., review of test, records ), ordering medications and test ,psychoeducation and supportive psychotherapy and care coordination,as well as documenting clinical information in electronic health record. This note was generated in part or whole with voice recognition software. Voice recognition is usually quite accurate but there are transcription errors that can and very often do occur. I apologize for any typographical errors that were not detected and corrected.     Ursula Alert, MD 08/11/2019, 10:24 AM

## 2019-08-14 ENCOUNTER — Telehealth: Payer: Self-pay

## 2019-08-14 NOTE — Telephone Encounter (Signed)
She has a stimulator put in last month and she woke up this morning left leg tingling and pinching feeling. She states its a horrible feeling. Please call

## 2019-08-14 NOTE — Telephone Encounter (Signed)
Spoke with patient.  She states stimulator has been turned off and she is still having this pain.  States she is in contact with [employer] rep.  Informed patient she should contact the surgeon who implanted her stimulator.  States she will and that she would go to the ED if necessary.

## 2019-08-18 ENCOUNTER — Telehealth: Payer: Self-pay

## 2019-08-18 DIAGNOSIS — R259 Unspecified abnormal involuntary movements: Secondary | ICD-10-CM

## 2019-08-18 MED ORDER — QUETIAPINE FUMARATE 100 MG PO TABS: 100.0000 mg | ORAL_TABLET | Freq: Every day | ORAL | 0 refills | Status: DC

## 2019-08-18 NOTE — Telephone Encounter (Signed)
pt called states she thinks she is having side affect of her medication.  pt states she is taping her fingers and the is hummin while she eats and she wants to know why.

## 2019-08-18 NOTE — Telephone Encounter (Signed)
Returned call to patient.  She reports she is having as well as having abnormal movements of her fingers, she reports this may have been going on for several years however it may be getting worse.  She reports she never talked about this to Probation officer and now and the only reason she is talking about it is since it is getting worse.  We will reduce Seroquel to 100 mg p.o. nightly.  We will make a referral to neurology.  She reports she used to follow-up with Dr. Melrose Nakayama in the past.  We will send a referral to Dr. Melrose Nakayama.

## 2019-08-19 NOTE — Telephone Encounter (Signed)
faxed and confirmed earlier today the referral for [facility]

## 2019-08-28 ENCOUNTER — Encounter: Payer: Self-pay | Admitting: Physical Therapy

## 2019-08-28 ENCOUNTER — Ambulatory Visit: Payer: Medicare HMO | Attending: Family Medicine | Admitting: Physical Therapy

## 2019-08-28 DIAGNOSIS — M25572 Pain in left ankle and joints of left foot: Secondary | ICD-10-CM | POA: Diagnosis present

## 2019-08-28 DIAGNOSIS — R269 Unspecified abnormalities of gait and mobility: Secondary | ICD-10-CM | POA: Diagnosis present

## 2019-08-28 DIAGNOSIS — M5442 Lumbago with sciatica, left side: Secondary | ICD-10-CM | POA: Insufficient documentation

## 2019-08-28 DIAGNOSIS — G8929 Other chronic pain: Secondary | ICD-10-CM | POA: Insufficient documentation

## 2019-08-28 DIAGNOSIS — M6281 Muscle weakness (generalized): Secondary | ICD-10-CM | POA: Diagnosis not present

## 2019-08-28 NOTE — Therapy (Addendum)
[facility] [address] [place], [place], [address] Phone: [phone]   Fax:  [phone]  Physical Therapy Evaluation  Patient Details  Name: Marie Clarke MRN: [id number] Date of Birth: April 20, 1970 Referring Provider (PT): Dr. Salome Holmes   Encounter Date: 08/28/2019   PT End of Session - 08/28/19 0750    Visit Number 1    Number of Visits 1    Date for PT Re-Evaluation 08/28/19    PT Start Time 0736    PT Stop Time 0935    PT Time Calculation (min) 119 min    Equipment Utilized During Treatment Gait belt    Activity Tolerance Patient tolerated treatment well;Patient limited by pain           Past Medical History:  Diagnosis Date  . Anemia   . Anginal pain ([place])   . Anxiety   . Arthritis   . Asthma   . Bilateral lower extremity edema   . Bipolar disorder ([place])   . CAD (coronary artery disease) unk  . CHF (congestive heart failure) ([place])   . Chronic pain syndrome   . COPD (chronic obstructive pulmonary disease) (HCC)    emphysema  . DDD (degenerative disc disease), lumbar   . Depression unk  . Diabetes mellitus without complication ([place])   . Diabetes mellitus, type II ([place])   . Drug overdose Apr 16, 2013   after dad died  . Dysrhythmia    history of tachy arrythmias  . Fibromyalgia   . GERD (gastroesophageal reflux disease)   . Headache    chronic migraines  . Hyperlipidemia   . Hypertension   . Hypothyroidism   . Left leg pain 04/29/2014  . MI (myocardial infarction) ([place]) Apr 16, 2005  . Muscle ache 09/16/2014  . Osteoporosis   . Overactive bladder   . Pancreatitis unk  . Panic attacks   . PTSD (post-traumatic stress disorder)   . Reflex sympathetic dystrophy   . Renal cyst, left   . Renal insufficiency    ckd stage iii  . Restless legs syndrome   . Sleep apnea 06/2019   uses cpap  . Sleep apnea   . Stroke ([place]) 16-Apr-2008   TIA x 2. no residual deficits  . Suicide attempt [facility])    after father died.  . Thyroid  disease    thyroid nodule  . TIA (transient ischemic attack) unk  . TIA (transient ischemic attack)     Past Surgical History:  Procedure Laterality Date  . ABDOMINAL HYSTERECTOMY    . CARDIAC CATHETERIZATION    . HERNIA REPAIR  07/15/2017   [facility]  . lumbar facet     several  . prolapse rectum surgery N/A July 2016  . THORACIC LAMINECTOMY FOR SPINAL CORD STIMULATOR N/A 07/27/2019   Procedure: THORACIC SPINAL CORD STIMULATOR PLACEMENT WITH RIGHT FLANK PULSE GENERATOR;  Surgeon: [name], MD;  Location: [facility] ORS;  Service: Neurosurgery;  Laterality: N/A;  . TONSILLECTOMY      There were no vitals filed for this visit.    Subjective Assessment - 08/28/19 0746    Subjective Pt. reports 6/10 L back/LE radicular symptoms.  Pt. entered PT with use of QC and L AFO.  Pt. reports a fall 2 months ago after getting out of bed (no AFO at the time).    Pertinent History Reviewed significant PMHx.    Patient Stated Goals FCE only    Currently in Pain? Yes    Pain Score 6  Pain Location Back    Pain Orientation Left    Pain Type Chronic pain    Pain Onset More than a month ago              [facility] PT Assessment - 08/28/19 0001      Assessment   Medical Diagnosis Lumbar Radiculopathy/ Complex Regional Pain Syndrome Type 1 of Left Lower Extremity    Referring Provider (PT) Dr. Salome Holmes    Onset Date/Surgical Date 03/16/03      Balance Screen   Has the patient fallen in the past 6 months Yes    How many times? 2    Has the patient had a decrease in activity level because of a fear of falling?  Yes    Is the patient reluctant to leave their home because of a fear of falling?  Yes      Prior Function   Level of Independence Requires assistive device for independence    Vocation On disability      Cognition   Overall Cognitive Status Within Functional Limits for tasks assessed      ROM / Strength   AROM / PROM / Strength --              Overall Level of Work:  Falls within the Sedentary range. Exerting up to 10 pounds of force occasionally (Occasionally: activity or condition exist up to 1/3 or the time) and/or a negligible amount of force frequently (Frequently: activity or condition exist from 1/3 to 2/3 or the time) to lift, carry, push, pull, or otherwise move objects, including the human body. Sedentary work involves sitting most of the time, but may involve walking or standing for brief periods of time. Jobs are sedentary if walking and standing are required only occasionally and all other sedentary criteria are met. Please see the Task Performance Table for specific abilities. Tolerance for the 8-Hour Day: Due to the client's limited ability to sit, she would have to alternate between sitting and other tasks as listed in the task performance table to tolerate the Sedentary level of work for the 8-hour day/40-hour week. Due to the limited sitting tolerance, however, the client will need to alternate among other tasks listed in the task performance table to maximize work tolerance to the 8-hour day.           Patient will benefit from skilled therapeutic intervention in order to improve the following deficits and impairments:     Visit Diagnosis: Muscle weakness (generalized)  Chronic left-sided low back pain with left-sided sciatica  Pain in left ankle and joints of left foot  Gait difficulty     Problem List Patient Active Problem List   Diagnosis Date Noted  . Abnormal involuntary movement 08/18/2019  . Bipolar disorder, in full remission, most recent episode mixed ([place]) 08/11/2019  . Lumbosacral radiculopathy at L5 (Right) 04/09/2019  . Chronic migraine 03/12/2019  . Gout 01/21/2019  . Noncompliance with treatment regimen 12/29/2018  . BMI 40.0-44.9, adult ([name]) 11/19/2018  . AKI (acute kidney injury) ([place]) 11/17/2018  . Postural dizziness with presyncope 09/29/2018  . Bipolar I disorder, most recent episode mixed ([place])  09/10/2018  . Panic attacks 09/10/2018  . Chronic lumbosacral L5-S1 IVD protrusion (Bilateral) 08/25/2018  . Lumbar lateral recess stenosis (L5-S1) (Bilateral) 08/25/2018  . Wound disruption 08/05/2018  . Osteoarthritis involving multiple joints 07/10/2018  . Long term current use of non-steroidal anti-inflammatories (NSAID) 07/10/2018  . NSAID induced gastritis 07/10/2018  . DDD (  degenerative disc disease), lumbosacral 04/22/2018  . Disease related peripheral neuropathy 11/13/2017  . Gastroesophageal reflux disease without esophagitis 11/13/2017  . Occipital headache (Bilateral) 11/13/2017  . Cervicogenic headache (Bilateral) (L>R) 11/13/2017  . History of postoperative nausea 10/22/2017  . Chronic shoulder pain (Fifth Area of Pain) (Bilateral) (L>R) 10/09/2017  . CRPS (complex regional pain syndrome) type 1 of lower limb (left ankle) 10/09/2017  . Ankle joint instability (Left) 10/09/2017  . Ankle sprain, sequela (Left) 10/09/2017  . History of psychiatric symptoms 10/09/2017  . Chronic pain syndrome 10/02/2017  . Spondylosis without myelopathy or radiculopathy, lumbosacral region 10/02/2017  . Chronic musculoskeletal pain 10/02/2017  . Elevated C-reactive protein (CRP) 09/10/2017  . Elevated sed rate 09/10/2017  . Chronic neck pain (Fourth Area of Pain) (Bilateral) (L>R) 09/09/2017  . Pharmacologic therapy 09/09/2017  . Disorder of skeletal system 09/09/2017  . Problems influencing health status 09/09/2017  . Long term current use of opiate analgesic 09/09/2017  . Tobacco use disorder 07/16/2017  . Ventral hernia without obstruction or gangrene 06/25/2017  . Strain of extensor muscle, fascia and tendon of left index finger at wrist and hand level, initial encounter 05/16/2017  . Sepsis ([place]) 04/14/2017  . Osteopenia 04/03/2017  . Hypotension 09/17/2016  . Contusion of knee (Left) 10/12/2015  . Strain of knee (Left) 10/12/2015  . Incidental lung nodule 04/28/2015  . Chronic  low back pain (Primary Area of Pain) (Bilateral) (L>R) 03/28/2015  . Chronic lower extremity pain (Referred) (Secondary Area of Pain) (Left) 03/28/2015  . Abdominal wound dehiscence 03/28/2015  . Encounter for pain management planning 03/28/2015  . Morbid obesity ([place]) 03/28/2015  . Abnormal CT scan, lumbar spine 03/28/2015  . Lumbar facet hypertrophy 03/28/2015  . Lumbar facet syndrome (Bilateral) (L>R) 03/28/2015  . Lumbar foraminal stenosis (Bilateral) (L5-S1) 03/28/2015  . Chronic ankle pain [facility] Area of Pain) (Left) 03/28/2015  . Neurogenic pain 03/28/2015  . Neuropathic pain 03/28/2015  . Myofascial pain 03/28/2015  . History of suicide attempt 03/28/2015  . PTSD (post-traumatic stress disorder) 01/13/2015  . Abnormal gait 12/15/2014  . Congestive heart failure ([place]) 11/15/2014  . Abdominal wall abscess 09/20/2014  . 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 Diabetes mellitus, type 2 ([place]) 08/13/2014  . Bipolar affective disorder ([place]) 08/13/2014  . Type 2 diabetes mellitus ([place]) 08/13/2014  . Rectal prolapse 08/09/2014  . Rectal bleeding 08/09/2014  . Rectal bleed 08/09/2014  . Affective bipolar disorder ([place]) 08/05/2014  . Arteriosclerosis of coronary artery 08/05/2014  . CCF (congestive cardiac failure) ([place]) 08/05/2014  . Chronic kidney disease 08/05/2014  . 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 Apnea, sleep 08/05/2014  . Temporary cerebral vascular dysfunction 08/05/2014  . Polypharmacy 04/29/2014  . Other long term (current) drug therapy 04/29/2014  . 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 Chronic kidney disease, stage III (moderate) ([place]) 12/14/2013  . Controlled diabetes mellitus type II without complication ([place]) 96/72/8979  . Essential (primary) hypertension 12/03/2013  . Adult hypothyroidism 12/03/2013  . Controlled type 2 diabetes mellitus without complication ([place]) 15/05/1362   Pura Spice, PT, DPT # [phone] 08/28/2019, 4:37 PM  [facility] [address]. [place], [place], [address] Phone: [phone]   Fax:  [phone]  Name: Manda Holstad MRN: [id number] Date of Birth: Nov 24, 1970

## 2019-08-31 NOTE — Addendum Note (Signed)
Addended by: Dorcas Carrow C on: 08/31/2019 10:08 AM   Modules accepted: Orders

## 2019-09-02 ENCOUNTER — Ambulatory Visit (HOSPITAL_COMMUNITY): Payer: Medicare HMO | Admitting: Licensed Clinical Social Worker

## 2019-09-03 ENCOUNTER — Other Ambulatory Visit: Payer: Self-pay | Admitting: Family Medicine

## 2019-09-03 DIAGNOSIS — Z1231 Encounter for screening mammogram for malignant neoplasm of breast: Secondary | ICD-10-CM

## 2019-09-06 ENCOUNTER — Emergency Department
Admission: EM | Admit: 2019-09-06 | Discharge: 2019-09-06 | Disposition: A | Discharge status: Home / Self Care | Payer: Medicare HMO | Attending: Student in an Organized Health Care Education/Training Program | Admitting: Student in an Organized Health Care Education/Training Program

## 2019-09-06 ENCOUNTER — Emergency Department: Payer: Medicare HMO

## 2019-09-06 ENCOUNTER — Other Ambulatory Visit: Payer: Self-pay

## 2019-09-06 DIAGNOSIS — Z79890 Hormone replacement therapy (postmenopausal): Secondary | ICD-10-CM | POA: Diagnosis not present

## 2019-09-06 DIAGNOSIS — Z8673 Personal history of transient ischemic attack (TIA), and cerebral infarction without residual deficits: Secondary | ICD-10-CM | POA: Diagnosis not present

## 2019-09-06 DIAGNOSIS — Z7951 Long term (current) use of inhaled steroids: Secondary | ICD-10-CM | POA: Insufficient documentation

## 2019-09-06 DIAGNOSIS — N183 Chronic kidney disease, stage 3 unspecified: Secondary | ICD-10-CM | POA: Diagnosis not present

## 2019-09-06 DIAGNOSIS — I130 Hypertensive heart and chronic kidney disease with heart failure and stage 1 through stage 4 chronic kidney disease, or unspecified chronic kidney disease: Secondary | ICD-10-CM | POA: Insufficient documentation

## 2019-09-06 DIAGNOSIS — I2510 Atherosclerotic heart disease of native coronary artery without angina pectoris: Secondary | ICD-10-CM | POA: Diagnosis not present

## 2019-09-06 DIAGNOSIS — I509 Heart failure, unspecified: Secondary | ICD-10-CM | POA: Insufficient documentation

## 2019-09-06 DIAGNOSIS — Z87891 Personal history of nicotine dependence: Secondary | ICD-10-CM | POA: Diagnosis not present

## 2019-09-06 DIAGNOSIS — K219 Gastro-esophageal reflux disease without esophagitis: Secondary | ICD-10-CM | POA: Diagnosis not present

## 2019-09-06 DIAGNOSIS — R21 Rash and other nonspecific skin eruption: Secondary | ICD-10-CM | POA: Insufficient documentation

## 2019-09-06 DIAGNOSIS — Z7984 Long term (current) use of oral hypoglycemic drugs: Secondary | ICD-10-CM | POA: Diagnosis not present

## 2019-09-06 DIAGNOSIS — J45909 Unspecified asthma, uncomplicated: Secondary | ICD-10-CM | POA: Diagnosis not present

## 2019-09-06 DIAGNOSIS — E039 Hypothyroidism, unspecified: Secondary | ICD-10-CM | POA: Diagnosis not present

## 2019-09-06 DIAGNOSIS — E1122 Type 2 diabetes mellitus with diabetic chronic kidney disease: Secondary | ICD-10-CM | POA: Diagnosis not present

## 2019-09-06 DIAGNOSIS — I252 Old myocardial infarction: Secondary | ICD-10-CM | POA: Insufficient documentation

## 2019-09-06 DIAGNOSIS — Z79899 Other long term (current) drug therapy: Secondary | ICD-10-CM | POA: Diagnosis not present

## 2019-09-06 DIAGNOSIS — J449 Chronic obstructive pulmonary disease, unspecified: Secondary | ICD-10-CM | POA: Diagnosis not present

## 2019-09-06 DIAGNOSIS — R1011 Right upper quadrant pain: Secondary | ICD-10-CM | POA: Diagnosis not present

## 2019-09-06 LAB — COMPREHENSIVE METABOLIC PANEL
ALT: 24 U/L (ref 0–44)
AST: 25 U/L (ref 15–41)
Albumin: 4.6 g/dL (ref 3.5–5.0)
Alkaline Phosphatase: 133 U/L — ABNORMAL HIGH (ref 38–126)
Anion gap: 14 (ref 5–15)
BUN: 20 mg/dL (ref 6–20)
CO2: 22 mmol/L (ref 22–32)
Calcium: 9.6 mg/dL (ref 8.9–10.3)
Chloride: 100 mmol/L (ref 98–111)
Creatinine, Ser: 1.05 mg/dL — ABNORMAL HIGH (ref 0.44–1.00)
GFR calc Af Amer: >60 mL/min (ref >60)
GFR calc non Af Amer: >60 mL/min (ref >60)
Glucose, Bld: 205 mg/dL — ABNORMAL HIGH (ref 70–99)
Potassium: 2.9 mmol/L — ABNORMAL LOW (ref 3.5–5.1)
Sodium: 136 mmol/L (ref 135–145)
Total Bilirubin: 0.7 mg/dL (ref 0.3–1.2)
Total Protein: 8.5 g/dL — ABNORMAL HIGH (ref 6.5–8.1)

## 2019-09-06 LAB — CBC
HCT: 39.5 % (ref 36.0–46.0)
Hemoglobin: 13.1 g/dL (ref 12.0–15.0)
MCH: 28.5 pg (ref 26.0–34.0)
MCHC: 33.2 g/dL (ref 30.0–36.0)
MCV: 85.9 fL (ref 80.0–100.0)
Platelets: 244 10*3/uL (ref 150–400)
RBC: 4.6 MIL/uL (ref 3.87–5.11)
RDW: 14.7 % (ref 11.5–15.5)
WBC: 6.9 10*3/uL (ref 4.0–10.5)
nRBC: 0 % (ref 0.0–0.2)

## 2019-09-06 LAB — URINALYSIS, COMPLETE (UACMP) WITH MICROSCOPIC
Bacteria, UA: NONE SEEN
Bilirubin Urine: NEGATIVE
Glucose, UA: NEGATIVE mg/dL
Hgb urine dipstick: NEGATIVE
Ketones, ur: NEGATIVE mg/dL
Leukocytes,Ua: NEGATIVE
Nitrite: NEGATIVE
Protein, ur: NEGATIVE mg/dL
Specific Gravity, Urine: 1.016 (ref 1.005–1.030)
Squamous Epithelial / HPF: NONE SEEN (ref 0–5)
pH: 5 (ref 5.0–8.0)

## 2019-09-06 LAB — LIPASE, BLOOD: Lipase: 34 U/L (ref 11–51)

## 2019-09-06 MED ORDER — ONDANSETRON 4 MG PO TBDP
ORAL_TABLET | ORAL | Status: AC
  Administered 2019-09-06: 4 mg
  Filled 2019-09-06: qty 1

## 2019-09-06 MED ORDER — SODIUM CHLORIDE 0.9 % IV BOLUS: 500.0000 mL | Freq: Once | INTRAVENOUS | Status: DC

## 2019-09-06 MED ORDER — ONDANSETRON HCL 4 MG/2ML IJ SOLN
4.0000 mg | Freq: Once | INTRAMUSCULAR | Status: DC
  Filled 2019-09-06: qty 2

## 2019-09-06 NOTE — Discharge Instructions (Addendum)

## 2019-09-06 NOTE — ED Notes (Signed)
US at bedside

## 2019-09-06 NOTE — ED Provider Notes (Signed)
[facility] Emergency Department Provider Note    First MD Initiated Contact with Patient 09/06/19 1715     (approximate)  I have reviewed the triage vital signs and the nursing notes.   HISTORY  Chief Complaint Abdominal Pain and Wound Infection    HPI Marie Clarke is a 49 y.o. female extensive past medical history is listed below presents to the ER for recurrent right upper quadrant pain as well as concern for warmth and pain to bilateral AC IV sites replaced at [facility] yesterday for CT imaging and work-up of abdominal pain.  CT imaging was reassuring.  Patient is concerned that she is having gallbladder attacks.  States that it is worsened with greasy food.  States that she is also "bothered by reflux.  "Denies any fevers or chills.  Apparently did have contrast extravasation during the CT from yesterday was directed to the ER for evaluation due to concern for infected IV site.    Past Medical History:  Diagnosis Date  . Anemia   . Anginal pain ([place])   . Anxiety   . Arthritis   . Asthma   . Bilateral lower extremity edema   . Bipolar disorder ([place])   . CAD (coronary artery disease) unk  . CHF (congestive heart failure) ([place])   . Chronic pain syndrome   . COPD (chronic obstructive pulmonary disease) (HCC)    emphysema  . DDD (degenerative disc disease), lumbar   . Depression unk  . Diabetes mellitus without complication ([place])   . Diabetes mellitus, type II ([place])   . Drug overdose May 18, 2013   after dad died  . Dysrhythmia    history of tachy arrythmias  . Fibromyalgia   . GERD (gastroesophageal reflux disease)   . Headache    chronic migraines  . Hyperlipidemia   . Hypertension   . Hypothyroidism   . Left leg pain 04/29/2014  . MI (myocardial infarction) ([place]) 05-18-2005  . Muscle ache 09/16/2014  . Osteoporosis   . Overactive bladder   . Pancreatitis unk  . Panic attacks   . PTSD (post-traumatic stress disorder)   . Reflex sympathetic dystrophy   .  Renal cyst, left   . Renal insufficiency    ckd stage iii  . Restless legs syndrome   . Sleep apnea 06/2019   uses cpap  . Sleep apnea   . Stroke ([place]) 05/18/2008   TIA x 2. no residual deficits  . Suicide attempt [facility])    after father died.  . Thyroid disease    thyroid nodule  . TIA (transient ischemic attack) unk  . TIA (transient ischemic attack)    Family History  Problem Relation Age of Onset  . Diabetes Mellitus II Mother   . CAD Mother   . Sleep apnea Mother   . Osteoarthritis Mother   . Osteoporosis Mother   . Anxiety disorder Mother   . Depression Mother   . Bipolar disorder Mother   . Bipolar disorder Father   . Hypertension Father   . Depression Father   . Anxiety disorder Father   . Post-traumatic stress disorder Sister    Past Surgical History:  Procedure Laterality Date  . ABDOMINAL HYSTERECTOMY    . CARDIAC CATHETERIZATION    . HERNIA REPAIR  07/15/2017   [facility]  . lumbar facet     several  . prolapse rectum surgery N/A July 2016  . THORACIC LAMINECTOMY FOR SPINAL CORD STIMULATOR N/A 07/27/2019   Procedure: THORACIC SPINAL  CORD STIMULATOR PLACEMENT WITH RIGHT FLANK PULSE GENERATOR;  Surgeon: [name], MD;  Location: [facility] ORS;  Service: Neurosurgery;  Laterality: N/A;  . TONSILLECTOMY     Patient Active Problem List   Diagnosis Date Noted  . Abnormal involuntary movement 08/18/2019  . Bipolar disorder, in full remission, most recent episode mixed ([place]) 08/11/2019  . Lumbosacral radiculopathy at L5 (Right) 04/09/2019  . Chronic migraine 03/12/2019  . Gout 01/21/2019  . Noncompliance with treatment regimen 12/29/2018  . BMI 40.0-44.9, adult ([place]) 11/19/2018  . AKI (acute kidney injury) ([place]) 11/17/2018  . Postural dizziness with presyncope 09/29/2018  . Bipolar I disorder, most recent episode mixed ([place]) 09/10/2018  . Panic attacks 09/10/2018  . Chronic lumbosacral L5-S1 IVD protrusion (Bilateral) 08/25/2018  . Lumbar lateral recess stenosis (L5-S1)  (Bilateral) 08/25/2018  . Wound disruption 08/05/2018  . Osteoarthritis involving multiple joints 07/10/2018  . Long term current use of non-steroidal anti-inflammatories (NSAID) 07/10/2018  . NSAID induced gastritis 07/10/2018  . DDD (degenerative disc disease), lumbosacral 04/22/2018  . Disease related peripheral neuropathy 11/13/2017  . Gastroesophageal reflux disease without esophagitis 11/13/2017  . Occipital headache (Bilateral) 11/13/2017  . Cervicogenic headache (Bilateral) (L>R) 11/13/2017  . History of postoperative nausea 10/22/2017  . Chronic shoulder pain (Fifth Area of Pain) (Bilateral) (L>R) 10/09/2017  . CRPS (complex regional pain syndrome) type 1 of lower limb (left ankle) 10/09/2017  . Ankle joint instability (Left) 10/09/2017  . Ankle sprain, sequela (Left) 10/09/2017  . History of psychiatric symptoms 10/09/2017  . Chronic pain syndrome 10/02/2017  . Spondylosis without myelopathy or radiculopathy, lumbosacral region 10/02/2017  . Chronic musculoskeletal pain 10/02/2017  . Elevated C-reactive protein (CRP) 09/10/2017  . Elevated sed rate 09/10/2017  . Chronic neck pain (Fourth Area of Pain) (Bilateral) (L>R) 09/09/2017  . Pharmacologic therapy 09/09/2017  . Disorder of skeletal system 09/09/2017  . Problems influencing health status 09/09/2017  . Long term current use of opiate analgesic 09/09/2017  . Tobacco use disorder 07/16/2017  . Ventral hernia without obstruction or gangrene 06/25/2017  . Strain of extensor muscle, fascia and tendon of left index finger at wrist and hand level, initial encounter 05/16/2017  . Sepsis ([place]) 04/14/2017  . Osteopenia 04/03/2017  . Hypotension 09/17/2016  . Contusion of knee (Left) 10/12/2015  . Strain of knee (Left) 10/12/2015  . Incidental lung nodule 04/28/2015  . Chronic low back pain (Primary Area of Pain) (Bilateral) (L>R) 03/28/2015  . Chronic lower extremity pain (Referred) (Secondary Area of Pain) (Left) 03/28/2015    . Abdominal wound dehiscence 03/28/2015  . Encounter for pain management planning 03/28/2015  . Morbid obesity ([place]) 03/28/2015  . Abnormal CT scan, lumbar spine 03/28/2015  . Lumbar facet hypertrophy 03/28/2015  . Lumbar facet syndrome (Bilateral) (L>R) 03/28/2015  . Lumbar foraminal stenosis (Bilateral) (L5-S1) 03/28/2015  . Chronic ankle pain [facility] Area of Pain) (Left) 03/28/2015  . Neurogenic pain 03/28/2015  . Neuropathic pain 03/28/2015  . Myofascial pain 03/28/2015  . History of suicide attempt 03/28/2015  . PTSD (post-traumatic stress disorder) 01/13/2015  . Abnormal gait 12/15/2014  . Congestive heart failure ([place]) 11/15/2014  . Abdominal wall abscess 09/20/2014  . 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 Diabetes mellitus, type 2 ([place]) 08/13/2014  . Bipolar affective disorder ([place]) 08/13/2014  . Type 2 diabetes mellitus ([place]) 08/13/2014  . Rectal prolapse 08/09/2014  . Rectal bleeding 08/09/2014  . Rectal bleed 08/09/2014  . Affective bipolar disorder ([place]) 08/05/2014  . Arteriosclerosis of coronary artery 08/05/2014  . CCF (  congestive cardiac failure) ([place]) 08/05/2014  . Chronic kidney disease 08/05/2014  . 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 Apnea, sleep 08/05/2014  . Temporary cerebral vascular dysfunction 08/05/2014  . Polypharmacy 04/29/2014  . Other long term (current) drug therapy 04/29/2014  . 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 Chronic kidney disease, stage III (moderate) ([place]) 12/14/2013  . Controlled diabetes mellitus type II without complication ([place]) 96/22/2979  . Essential (primary) hypertension 12/03/2013  . Adult hypothyroidism 12/03/2013  . Controlled type 2 diabetes mellitus without complication ([place]) 89/[birth year]/1941      Prior to Admission medications   Medication Sig Start Date End Date Taking? Authorizing Provider  atorvastatin (LIPITOR) 40 MG tablet Take 40 mg by mouth at bedtime.     [provider]  B Complex-C (B-COMPLEX WITH  VITAMIN C) tablet Take by mouth.    [provider]  busPIRone (BUSPAR) 15 MG tablet Take 1 tablet (15 mg total) by mouth 2 (two) times daily. 06/17/19   Ursula Alert, MD  carvedilol (COREG) 3.125 MG tablet Take by mouth. Patient not taking: Reported on 07/27/2019 04/16/19 04/15/20  [provider]  celecoxib (CELEBREX) 100 MG capsule Take by mouth. 06/29/19   [provider]  Cholecalciferol (VITAMIN D3) 125 MCG (5000 UT) TABS Take by mouth.    [provider]  colchicine (COLCRYS) 0.6 MG tablet as needed. For gout flare up Patient not taking: Reported on 07/27/2019    [provider]  Cysteamine Bitartrate (PROCYSBI) 300 MG PACK Use 1 each 3 (three) times daily One Touch Single Use.  Use as instructed. 05/26/19 05/25/20  [provider]  darifenacin (ENABLEX) 7.5 MG 24 hr tablet Take 7.5 mg by mouth at bedtime. Use as needed for OAB 03/18/19 03/17/20  [provider]  desvenlafaxine (PRISTIQ) 50 MG 24 hr tablet Take 1 tablet (50 mg total) by mouth at bedtime. 08/11/19   Ursula Alert, MD  dicyclomine (BENTYL) 10 MG capsule as needed.     [provider]  diphenoxylate-atropine (LOMOTIL) 2.5-0.025 MG tablet Take by mouth. 12/25/17   [provider]  Dulaglutide 0.75 MG/0.5ML SOPN Inject 0.75 mg into the skin every Saturday.  07/15/18   [provider]  famotidine (PEPCID) 20 MG tablet Take 20 mg by mouth daily. 04/01/19   [provider]  fluticasone Asencion Islam) 50 MCG/ACT nasal spray  06/03/19   [provider]  fluticasone (FLONASE) 50 MCG/ACT nasal spray Place into the nose. 06/03/19   [provider]  gabapentin (NEURONTIN) 600 MG tablet Take by mouth 3 (three) times daily.  04/21/19 04/20/20  [provider]  glucose blood test strip OneTouch Verio test strips    [provider]  HYDROcodone-acetaminophen (NORCO/VICODIN) 5-325 MG tablet Take 1 tablet by mouth every 6 (six) hours  as needed. 07/17/19   [provider]  hydrOXYzine (ATARAX/VISTARIL) 25 MG tablet Take 1 tablet (25 mg total) by mouth every 6 (six) hours as needed. Patient taking differently: Take 25 mg by mouth every 6 (six) hours as needed for itching. Primarily for itching 06/17/19   Ursula Alert, MD  KLOR-CON M20 20 MEQ tablet Take 0.5 tablets (10 mEq total) by mouth 2 (two) times daily. Patient taking differently: Take 20 mEq by mouth daily. But can take twice a day if needed 11/18/18   Mayo, Pete Pelt, MD  Lancets (ACCU-CHEK SAFE-T PRO) lancets  11/21/18   [provider]  levocetirizine (XYZAL) 5 MG tablet Take 5 mg by mouth daily. Takes at  night 06/04/18   [provider]  levothyroxine (SYNTHROID) 50 MCG tablet Take 50 mcg by mouth daily. (275mcg total daily) 09/03/18   [provider]  levothyroxine (SYNTHROID, LEVOTHROID) 200 MCG tablet Take 200 mcg by mouth daily. (271mcg total daily)    [provider]  Magnesium Oxide 500 MG CAPS Take 1 capsule (500 mg total) by mouth 2 (two) times daily at 8 am and 10 pm. Patient not taking: Reported on 07/22/2019 04/13/19 04/12/20  Milinda Pointer, MD  methocarbamol (ROBAXIN) 500 MG tablet  07/27/19   [provider]  methocarbamol (ROBAXIN) 750 MG tablet Take 1 tablet (750 mg total) by mouth every 8 (eight) hours as needed for muscle spasms. 04/13/19 10/10/19  Milinda Pointer, MD  metoprolol succinate (TOPROL-XL) 25 MG 24 hr tablet Take 25 mg by mouth daily. Takes in the evening 02/27/19   [provider]  montelukast (SINGULAIR) 10 MG tablet Take 10 mg by mouth daily. Takes in the afternoon    [provider]  nitroGLYCERIN (NITROSTAT) 0.4 MG SL tablet Place 0.4 mg under the tongue every 5 (five) minutes as needed for chest pain.     [provider]  Olopatadine HCl 0.2 % SOLN  01/01/19   [provider]  omeprazole (PRILOSEC) 20 MG capsule Take 20 mg by mouth daily. 04/01/19    [provider]  ondansetron (ZOFRAN-ODT) 8 MG disintegrating tablet Take 8 mg by mouth every 8 (eight) hours as needed for nausea or vomiting.    [provider]  pregabalin (LYRICA) 150 MG capsule Take 1 capsule (150 mg total) by mouth 2 (two) times daily. Patient not taking: Reported on 07/22/2019 04/13/19 10/10/19  Milinda Pointer, MD  prochlorperazine (COMPAZINE) 10 MG tablet prochlorperazine maleate 10 mg tablet    [provider]  promethazine (PHENERGAN) 25 MG tablet Take 25 mg by mouth as needed. 03/16/19   [provider]  QUEtiapine (SEROQUEL) 100 MG tablet Take 1 tablet (100 mg total) by mouth at bedtime. Dose reduction 08/18/19   Ursula Alert, MD  rizatriptan (MAXALT) 10 MG tablet Take 1 tablet (10 mg total) by mouth as needed. 01/21/19 07/20/19  Milinda Pointer, MD  rOPINIRole (REQUIP) 0.25 MG tablet Take 1 tablet by mouth in the morning, at noon, and at bedtime. 03/29/19 03/28/20  [provider]  solifenacin (VESICARE) 5 MG tablet Take by mouth. Patient not taking: Reported on 07/27/2019 01/16/19 01/16/20  [provider]  topiramate (TOPAMAX) 50 MG tablet Take 50 mg by mouth 2 (two) times daily.    [provider]  torsemide (DEMADEX) 20 MG tablet Take 1 tablet (20 mg total) by mouth 2 (two) times daily. 11/18/18 12/18/18  Mayo, Pete Pelt, MD  TRELEGY ELLIPTA 100-62.5-25 MCG/INH AEPB Inhale 1 puff into the lungs daily. Takes at night    [provider]  triamterene-hydrochlorothiazide (MAXZIDE-25) 37.5-25 MG tablet Take by mouth. Patient not taking: Reported on 07/27/2019 04/16/19 04/15/20  [provider]  VENTOLIN HFA 108 (90 Base) MCG/ACT inhaler Inhale 1-2 puffs into the lungs every 6 (six) hours as needed for wheezing or shortness of breath.  05/03/18   [provider]  vitamin E 400 UNIT capsule 400 Units daily.     [provider]    Allergies Diazepam, Ziprasidone hcl, Azithromycin,  Divalproex sodium, Levofloxacin, Lisinopril, Metronidazole, Sulfa antibiotics, Valproic acid, Cephalexin, Ciprofloxacin, and Penicillins    Social History Social History   Tobacco Use  . Smoking status: Former Smoker  Packs/day: 0.50    Types: Cigarettes    Quit date: 09/01/2018    Years since quitting: 1.0  . Smokeless tobacco: Never Used  . Tobacco comment: Patient quit smoking 09/01/2018  Vaping Use  . Vaping Use: Never used  Substance Use Topics  . Alcohol use: No    Alcohol/week: 0.0 standard drinks  . Drug use: No    Review of Systems Patient denies headaches, rhinorrhea, blurry vision, numbness, shortness of breath, chest pain, edema, cough, abdominal pain, nausea, vomiting, diarrhea, dysuria, fevers, rashes or hallucinations unless otherwise stated above in HPI. ____________________________________________   PHYSICAL EXAM:  VITAL SIGNS: Vitals:   09/06/19 1846 09/06/19 1849  BP:  (!) 124/100  Pulse: 104 104  Resp:  18  Temp:  98.5 F (36.9 C)  SpO2: 98% 97%    Constitutional: Alert and oriented.  Eyes: Conjunctivae are normal.  Head: Atraumatic. Nose: No congestion/rhinnorhea. Mouth/Throat: Mucous membranes are moist.   Neck: No stridor. Painless ROM.  Cardiovascular: Normal rate, regular rhythm. Grossly normal heart sounds.  Good peripheral circulation. Respiratory: Normal respiratory effort.  No retractions. Lungs CTAB. Gastrointestinal: Soft, mild RUQ ttp, No distention. No abdominal bruits. No CVA tenderness. Genitourinary:  Musculoskeletal: ecchymosis and small amaont of erythema/dermatitis over bilateral AC fossa in the shape of tape used to secure IV. No crepitus, compartments are soft, No lower extremity tenderness nor edema.  No joint effusions. Neurologic:  Normal speech and language. No gross focal neurologic deficits are appreciated. No facial droop Skin:  Skin is warm, dry and intact. No rash noted. Psychiatric: Mood and affect are normal.  Speech and behavior are normal.  ____________________________________________   LABS (all labs ordered are listed, but only abnormal results are displayed)  Results for orders placed or performed during the hospital encounter of 09/06/19 (from the past 24 hour(s))  Lipase, blood     Status: None   Collection Time: 09/06/19  2:09 PM  Result Value Ref Range   Lipase 34 11 - 51 U/L  Comprehensive metabolic panel     Status: Abnormal   Collection Time: 09/06/19  2:09 PM  Result Value Ref Range   Sodium 136 135 - 145 mmol/L   Potassium 2.9 (L) 3.5 - 5.1 mmol/L   Chloride 100 98 - 111 mmol/L   CO2 22 22 - 32 mmol/L   Glucose, Bld 205 (H) 70 - 99 mg/dL   BUN 20 6 - 20 mg/dL   Creatinine, Ser 1.05 (H) 0.44 - 1.00 mg/dL   Calcium 9.6 8.9 - 10.3 mg/dL   Total Protein 8.5 (H) 6.5 - 8.1 g/dL   Albumin 4.6 3.5 - 5.0 g/dL   AST 25 15 - 41 U/L   ALT 24 0 - 44 U/L   Alkaline Phosphatase 133 (H) 38 - 126 U/L   Total Bilirubin 0.7 0.3 - 1.2 mg/dL   GFR calc non Af Amer >60 >60 mL/min   GFR calc Af Amer >60 >60 mL/min   Anion gap 14 5 - 15  CBC     Status: None   Collection Time: 09/06/19  2:09 PM  Result Value Ref Range   WBC 6.9 4.0 - 10.5 K/uL   RBC 4.60 3.87 - 5.11 MIL/uL   Hemoglobin 13.1 12.0 - 15.0 g/dL   HCT 39.5 36 - 46 %   MCV 85.9 80.0 - 100.0 fL   MCH 28.5 26.0 - 34.0 pg   MCHC 33.2 30.0 - 36.0 g/dL   RDW 14.7 11.5 -  15.5 %   Platelets 244 150 - 400 K/uL   nRBC 0.0 0.0 - 0.2 %  Urinalysis, Complete w Microscopic     Status: Abnormal   Collection Time: 09/06/19  2:09 PM  Result Value Ref Range   Color, Urine YELLOW (A) YELLOW   APPearance CLEAR (A) CLEAR   Specific Gravity, Urine 1.016 1.005 - 1.030   pH 5.0 5.0 - 8.0   Glucose, UA NEGATIVE NEGATIVE mg/dL   Hgb urine dipstick NEGATIVE NEGATIVE   Bilirubin Urine NEGATIVE NEGATIVE   Ketones, ur NEGATIVE NEGATIVE mg/dL   Protein, ur NEGATIVE NEGATIVE mg/dL   Nitrite NEGATIVE NEGATIVE   Leukocytes,Ua NEGATIVE NEGATIVE    RBC / HPF 0-5 0 - 5 RBC/hpf   WBC, UA 0-5 0 - 5 WBC/hpf   Bacteria, UA NONE SEEN NONE SEEN   Squamous Epithelial / LPF NONE SEEN 0 - 5   Hyaline Casts, UA PRESENT    ____________________________________________ ____________________________________________  RADIOLOGY  I personally reviewed all radiographic images ordered to evaluate for the above acute complaints and reviewed radiology reports and findings.  These findings were personally discussed with the patient.  Please see medical record for radiology report.  ____________________________________________   PROCEDURES  Procedure(s) performed:  Procedures    Critical Care performed: no ____________________________________________   INITIAL IMPRESSION / ASSESSMENT AND PLAN / ED COURSE  Pertinent labs & imaging results that were available during my care of the patient were reviewed by me and considered in my medical decision making (see chart for details).   DDX: biliary colic, cholelithiasis, cholecystitis, enteritis, gastritis, cellulitis, abscess  Marie Clarke is a 49 y.o. who presents to the ED with right upper quadrant abdominal pain epigastric discomfort as well as concern over rash to bilateral AC after having IV placed yesterday and work-up at [facility].  Had extensive work-up yesterday which was reassuring but does show evidence of prolapse.  Today she is concerned about biliary colic and worried that her pain is related to gallbladder.  History would be consistent with such.  Will order right upper quadrant ultrasound to further evaluate.  Her skin changes in the bilateral AC do not appear to be consistent with cellulitis or infectious process.  Compartments are soft.  Clinical Course as of Sep 05 1898  Sun Sep 06, 2019  1847 Right upper quadrant ultrasound is reassuring. I do not see any indication for repeat CT imaging. The area of redness appears to be more of a dermatitis related to aware the tape was holding as it is  actually in the shape of tape. I do not believe this reflects acute cellulitis particularly given lack of leukocytosis. Tolerating p.o.   [PR]    Clinical Course User Index [PR] Merlyn Lot, MD    The patient was evaluated in Emergency Department today for the symptoms described in the history of present illness. He/she was evaluated in the context of the global COVID-19 pandemic, which necessitated consideration that the patient might be at risk for infection with the SARS-CoV-2 virus that causes COVID-19. Institutional protocols and algorithms that pertain to the evaluation of patients at risk for COVID-19 are in a state of rapid change based on information released by regulatory bodies including the CDC and federal and state organizations. These policies and algorithms were followed during the patient's care in the ED.  As part of my medical decision making, I reviewed the following data within the [place] notes reviewed and incorporated, Labs reviewed, notes  from prior ED visits and [place] Controlled Substance Database   ____________________________________________   FINAL CLINICAL IMPRESSION(S) / ED DIAGNOSES  Final diagnoses:  RUQ abdominal pain  Rash and nonspecific skin eruption      NEW MEDICATIONS STARTED DURING THIS VISIT:  New Prescriptions   No medications on file     Note:  This document was prepared using Dragon voice recognition software and may include unintentional dictation errors.    Merlyn Lot, MD 09/06/19 1901

## 2019-09-06 NOTE — ED Triage Notes (Signed)
Pt states that she was seen at [facility] yesterday and had 2 ivs, right iv site patient states is draining yellow, pt also states that the left iv site is swollen and states the ct contrast went into her arm not through the vein, pt also states that she wants her gallbladder checked because she believes her issue it with it rather than the diagnosis of prolapsed bowel from [facility]

## 2019-09-08 ENCOUNTER — Encounter: Payer: Self-pay | Admitting: Pain Medicine

## 2019-09-08 NOTE — Progress Notes (Signed)
Patient: Marie Clarke  Service Category: E/M  Provider: Gaspar Cola, MD  DOB: January 31, 1971  DOS: 09/09/2019  Location: Office  MRN: [id number]  Setting: Ambulatory outpatient  Referring Provider: Sharyne Peach, MD  Type: Established Patient  Specialty: Interventional Pain Management  PCP: Sharyne Peach, MD  Location: Remote location  Delivery: TeleHealth     Virtual Encounter - Pain Management PROVIDER NOTE: Information contained herein reflects review and annotations entered in association with encounter. Interpretation of such information and data should be left to medically-trained personnel. Information provided to patient can be located elsewhere in the medical record under "Patient Instructions". Document created using STT-dictation technology, any transcriptional errors that may result from process are unintentional.    Contact & Pharmacy Preferred: [phone] Home: [phone] (home) Mobile: [phone] (mobile) E-mail: [email or web address]  [place], [employer] [facility] [place] [address] [place] [address] Phone: [phone] Fax: [phone]   Pre-screening  Marie Clarke offered "in-person" vs "virtual" encounter. She indicated preferring virtual for this encounter.   Reason COVID-19*  Social distancing based on CDC and AMA recommendations.   I contacted Marie Clarke on 09/09/2019 via telephone.      I clearly identified myself as Gaspar Cola, MD. I verified that I was speaking with the correct person using two identifiers (Name: Marie Clarke, and date of birth: [birth year]).  Consent I sought verbal advanced consent from [facility] for virtual visit interactions. I informed Marie Clarke of possible security and privacy concerns, risks, and limitations associated with providing "not-in-person" medical evaluation and management services. I also informed Marie Clarke of the availability of "in-person" appointments. Finally, I informed her that there  would be a charge for the virtual visit and that she could be  personally, fully or partially, financially responsible for it. Ms. Char expressed understanding and agreed to proceed.   Historic Elements   Ms. Nygeria Clarke is a 49 y.o. year old, female patient evaluated today after her last contact with our practice on 08/14/2019. [name]  has a past medical history of Anemia, Anginal pain ([place]), Anxiety, Arthritis, Asthma, Bilateral lower extremity edema, Bipolar disorder ([place]), CAD (coronary artery disease) ([place]), CHF (congestive heart failure) ([place]), Chronic pain syndrome, COPD (chronic obstructive pulmonary disease) ([place]), DDD (degenerative disc disease), lumbar, Depression ([place]), Diabetes mellitus without complication ([place]), Diabetes mellitus, type II ([place]), Drug overdose (2015), Dysrhythmia, Fibromyalgia, GERD (gastroesophageal reflux disease), Headache, Hyperlipidemia, Hypertension, Hypothyroidism, Left leg pain (04/29/2014), MI (myocardial infarction) ([place]) (2007), Muscle ache (09/16/2014), Osteoporosis, Overactive bladder, Pancreatitis ([place]), Panic attacks, PTSD (post-traumatic stress disorder), Reflex sympathetic dystrophy, Renal cyst, left, Renal insufficiency, Restless legs syndrome, Sleep apnea (06/2019), Sleep apnea, Stroke ([place]) (2010), Suicide attempt [facility]), Thyroid disease, TIA (transient ischemic attack) ([place]), and TIA (transient ischemic attack). She also  has a past surgical history that includes prolapse rectum surgery (N/A, July 2016); Tonsillectomy; Abdominal hysterectomy; Hernia repair (07/15/2017); Cardiac catheterization; lumbar facet; and Thoracic laminectomy for spinal cord stimulator (N/A, 07/27/2019). Marie Clarke has a current medication list which includes the following prescription(s): atorvastatin, b-complex with vitamin c, buspirone, celecoxib, vitamin d3, colchicine, procysbi, darifenacin, desvenlafaxine, dicyclomine, diphenoxylate-atropine, dulaglutide, famotidine, fluticasone, gabapentin,  glucose blood, hydroxyzine, klor-con m20, accu-chek safe-t pro, levocetirizine, levothyroxine, levothyroxine, methocarbamol, metoprolol succinate, montelukast, nitroglycerin, olopatadine hcl, ondansetron, oxycodone hcl, quetiapine, rizatriptan, ropinirole, topiramate, torsemide, tramadol, trelegy ellipta, ventolin hfa, and vitamin e. She  reports that she quit smoking about a year ago. Her smoking use included cigarettes. She smoked 0.50 packs per day. She  has never used smokeless tobacco. She reports that she does not drink alcohol and does not use drugs. [name] is allergic to diazepam, ziprasidone hcl, azithromycin, divalproex sodium, levofloxacin, lisinopril, metronidazole, sulfa antibiotics, valproic acid, cephalexin, ciprofloxacin, and penicillins.   HPI  Today, she is being contacted for medication management.  Previously I had been prescribing tramadol 50 mg 2 tablets p.o. every 6 hours for pain.  The last prescription I had given her was on 01/21/2019 to last for 6 months.  She had her last refill done on 07/03/2019.  Since then, the patient has obtained hydrocodone/APAP 5/325 from Dr. Nicki Reaper C. Duanne Moron; oxycodone 5 mg prescribed by Geoffry Paradise on 07/27/2019, 07/30/2019, 08/14/2019; and oxycodone 10 mg on 09/06/2019. On 07/27/2019 the patient had a spinal cord stimulator implant by Dr. [name].  Upon contacting the patient today, she indicated that she had her spinal cord stimulator implanted which is helping with her low back pain.  Unfortunately, she is having more pain in the left ankle secondary to her CRPS.  She says that the spinal cord stimulator is not covering that area.  She indicates that she cannot feel any stimulation in the right ankle.  I will go ahead and schedule a visit where we will try to have the [employer] representative in the clinic to reprogram the device to see if we can capture of the pain in the right ankle.  In addition, today the patient indicated that her tramadol was not  taking care of the pain and she needed something else.  When she said this, I confronted her with the fact that her PMP indicated that she had recently received hydrocodone and oxycodone prescriptions from different physicians.  When confronted with this, she indicated that this was true and that she had done it because she was having quite a bit of pain.  She also indicated that she had some surgery and she ended up in the emergency room because of a prolapse and she took additional pain medicine for that.  I told the patient that even though we do allow for occasional additional pain medicine during emergencies or surgeries, it is her responsibility to contact our clinic and notify us of those emergencies and prescriptions.  She admitted that she had not done that and she attempted to excuse her self by saying that she was having more pain.  Unfortunately, 100% of my patients have chronic and acute pain at any given time, but that does not mean that the can this regard her medication rules and regulations.  Today I went over her medications and she has been taking 1200 mg of gabapentin 3 times daily, which is prescribed by her primary care physician Dr. Salome Holmes.  This is indicated a more than adequate for this patient's condition.  However she should only be taking either Neurontin or Lyrica and therefore I will not be prescribing any more of the Lyrica.  Today the patient was very insistent and getting either more pain medication or a different type of opioid pain medication.  I told the patient that unfortunately, due to her this regard towards our medication rules and regulations, we will no longer be prescribing the tramadol and we would certainly not be prescribing anything stronger.  I have informed the patient that I will be bringing her in for a left lumbar sympathetic block which I will try to combine with a visit with the [employer] representative so that her spinal cord stimulator can be  reprogrammed.  Pharmacotherapy Assessment  Analgesic: No opioid analgesics prescribed by our practice.I have discontinue all opioid analgesics on 09/09/2019 secondary to the patient asked this regard towards our medication rules and regulations.  This patient is High-Risk due to history of a prior suicidal attempt w/ medications.  The PMP reveals that she has obtained opioid analgesic medications from several physicians, without notifying our practice of this. MME/day:79m/day.   Monitoring: [place] PMP: PDMP reviewed during this encounter.       Pharmacotherapy: No side-effects or adverse reactions reported.  At the time of the patient's last UDS on 07/02/2019 she was still compliant.  Unfortunately, this changed by 09/09/2019. Compliance: On 09/09/2019 we identify the patient to be completely disregarding our medication rules and regulations.  For this reason we discontinued our opioid analgesics.  At the time the patient had obtained opioid analgesics from several other physicians without notifying our practice. Effectiveness: She complains of the medications not being enough. Plan: Refer to "POC".  UDS:  Summary  Date Value Ref Range Status  07/02/2019 Note  Final    Comment:    ==================================================================== [employer] Select 13 (MW) ==================================================================== Specimen Alert Note: Urinary creatinine is very low; ability to detect some drugs may be compromised; creatinine-normalized drug concentrations should be interpreted with caution. Suggest recollection. (Creatinine) ==================================================================== Test                             Result       Flag       Units Drug Present and Declared for Prescription Verification   Tramadol                       >62500       EXPECTED   ng/mg creat   O-Desmethyltramadol            22813        EXPECTED   ng/mg creat   N-Desmethyltramadol             11550        EXPECTED   ng/mg creat    Source of tramadol is a prescription medication. O-desmethyltramadol    and N-desmethyltramadol are expected metabolites of tramadol. ==================================================================== Test                      Result    Flag   Units      Ref Range   Creatinine              8         LL     mg/dL      >=20 ==================================================================== Declared Medications:  The flagging and interpretation on this report are based on the  following declared medications.  Unexpected results may arise from  inaccuracies in the declared medications.  **Note: The testing scope of this panel includes these medications:  Tramadol  **Note: The testing scope of this panel does not include the  following reported medications:  Albuterol  Aspirin  Atorvastatin  Atropine (Lomotil)  Buspirone  Carvedilol (Coreg)  Celecoxib (Celebrex)  Colchicine  Cysteamine (Procysbi)  Darifenacin  Dicyclomine (Bentyl)  Diphenoxylate (Lomotil)  Dulaglutide  Eye Drops  Famotidine (Pepcid)  Fluticasone  Fluticasone (Trelegy)  Gabapentin  Hydrochlorothiazide  Hydroxyzine  Levocetirizine (Xyzal)  Levothyroxine  Magnesium (Mag-Ox)  Methocarbamol  Metoprolol  Montelukast (Singulair)  Naproxen  Nitroglycerin  Omeprazole  Ondansetron  Potassium  Prednisone  Pregabalin  Prochlorperazine (Compazine)  Promethazine  Quetiapine (Seroquel)  Rizatriptan (Maxalt)  Ropinirole (Requip)  Solifenacin (Vesicare)  Topiramate (Topamax)  Torsemide  Triamterene  Umeclidinium (Trelegy)  Vilanterol (Trelegy)  Vitamin B  Vitamin C  Vitamin D3  Vitamin E ==================================================================== For clinical consultation, please call [phone]. ====================================================================     Laboratory Chemistry Profile   Renal Lab Results  Component Value  Date   BUN 20 09/06/2019   CREATININE 1.05 (H) 09/06/2019   BCR 16 09/09/2017   GFRAA >60 09/06/2019   GFRNONAA >60 09/06/2019     Hepatic Lab Results  Component Value Date   AST 25 09/06/2019   ALT 24 09/06/2019   ALBUMIN 4.6 09/06/2019   ALKPHOS 133 (H) 09/06/2019   LIPASE 34 09/06/2019     Electrolytes Lab Results  Component Value Date   NA 136 09/06/2019   K 2.9 (L) 09/06/2019   CL 100 09/06/2019   CALCIUM 9.6 09/06/2019   MG 1.9 09/09/2017     Bone Lab Results  Component Value Date   25OHVITD1 43 09/09/2017   25OHVITD2 <1.0 09/09/2017   25OHVITD3 43 09/09/2017     Inflammation (CRP: Acute Phase) (ESR: Chronic Phase) Lab Results  Component Value Date   CRP 21 (H) 09/09/2017   ESRSEDRATE 50 (H) 09/09/2017   LATICACIDVEN 1.6 01/11/2019       Note: Above Lab results reviewed.  Imaging  US ABDOMEN LIMITED RUQ CLINICAL DATA:  49 year old female with history of right upper quadrant abdominal pain, nausea, vomiting and bloating for the past 2 days.  EXAM: ULTRASOUND ABDOMEN LIMITED RIGHT UPPER QUADRANT  COMPARISON:  No priors.  FINDINGS: Gallbladder:  No gallstones or wall thickening visualized. No sonographic Murphy sign noted by sonographer.  Common bile duct:  Diameter: 3.2 mm  Liver:  No focal lesion identified. Diffusely increased hepatic echogenicity, suggesting hepatic steatosis. Portal vein is patent on color Doppler imaging with normal direction of blood flow towards the liver.  Other: None.  IMPRESSION: 1. No acute findings. Specifically, no gallstones and no findings to suggest an acute cholecystitis at this time. 2. Hepatic steatosis.  Electronically Signed   By: Vinnie Langton M.D.   On: 09/06/2019 18:42  Assessment  The primary encounter diagnosis was Chronic pain syndrome. 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  Plan of Care  Problem-specific:  No problem-specific Assessment & Plan notes found for this encounter.  Ms. Edeline Greening has a current medication list which includes the following long-term medication(s): atorvastatin, colchicine, desvenlafaxine, fluticasone, klor-con m20, levothyroxine, levothyroxine, methocarbamol, montelukast, nitroglycerin, quetiapine, rizatriptan, ropinirole, and torsemide.  Pharmacotherapy (Medications Ordered): Meds ordered this encounter  Medications  . methocarbamol (ROBAXIN) 750 MG tablet    Sig: Take 1 tablet (750 mg total) by mouth every 8 (eight) hours as needed for muscle spasms.    Dispense:  90 tablet    Refill:  5    Fill one day early if pharmacy is closed on scheduled refill date. May substitute for generic if available.   Orders:  Orders Placed This Encounter  Procedures  . LUMBAR SYMPATHETIC BLOCK  For sympathetically-mediated lower extremity pain.    Standing Status:   Future    Standing Expiration Date:   10/09/2019    Scheduling Instructions:     Purpose: Diagnostic     Laterality: Left-sided     Level(s): Lumbar sympathetic chain (L3, L4)     Sedation: Sedation recommended.     Scheduling Timeframe: As permitted by the schedule    Order Specific Question:   Where will this procedure be performed?    Answer:   [facility]   Follow-up plan:   Return for Procedure (w/ sedation): (L) LSB #1 + SCS reprograming.      Interventional management options:  Considering:   NOTE: The patient is medical psychology evaluation indicates that the patient is a "suitable candidate" for a spinal cord stimulator  trial. Possible lumbar spinal cord stimulator implant([place] done 04/22/2019.  She is currently pending implant.)  Diagnosticbilateral lumbar facet block Possible bilateral lumbar facetRFA Diagnostic left L5 TFESI Diagnostic left L5 SNRB Possible left L5 DRG RFA Diagnostic bilateral cervical facet block Possible bilateral cervical facetRFA Diagnosticleft IA shoulder injection Diagnosticleft suprascapular NB Diagnostic left suprascapularRFA Diagnostic left lumbar sympathetic block (LSB) #1  Possible left lumbar sympathetic RFA   Palliative PRN treatment(s):   Therapeutic/palliative left L5 TFESI #2 + right L4-5 LESI #2 (100/100/50/100) Palliative bilateral lumbar facet blocks Palliative right lumbar facet RFA #2 (last done 04/22/2018) Palliative left lumbar facet RFA #2 (last done 03/13/2018)     Recent Visits Date Type Provider Dept  06/29/19 Telemedicine Milinda Pointer, MD [facility]  Showing recent visits within past 90 days and meeting all other requirements Today's Visits Date Type Provider Dept  09/09/19 Telemedicine Milinda Pointer, MD [facility]  Showing today's visits and meeting all other requirements Future Appointments No visits were found meeting these conditions. Showing future appointments within next 90 days and meeting all other requirements  I discussed the assessment and treatment plan with the patient. The patient was provided an opportunity to ask questions and all were answered. The patient agreed with the plan and demonstrated an understanding of the instructions.  Patient advised to call back or seek an in-person evaluation if the symptoms or condition worsens.  Duration of encounter: 22 minutes.  Note by: Gaspar Cola, MD Date: 09/09/2019; Time: 9:07 AM

## 2019-09-09 ENCOUNTER — Ambulatory Visit: Payer: Medicare HMO | Attending: Pain Medicine | Admitting: Pain Medicine

## 2019-09-09 ENCOUNTER — Other Ambulatory Visit: Payer: Self-pay

## 2019-09-09 DIAGNOSIS — M545 Low back pain, unspecified: Secondary | ICD-10-CM

## 2019-09-09 DIAGNOSIS — G90522 Complex regional pain syndrome I of left lower limb: Secondary | ICD-10-CM

## 2019-09-09 DIAGNOSIS — M25572 Pain in left ankle and joints of left foot: Secondary | ICD-10-CM | POA: Diagnosis not present

## 2019-09-09 DIAGNOSIS — M542 Cervicalgia: Secondary | ICD-10-CM

## 2019-09-09 DIAGNOSIS — M79605 Pain in left leg: Secondary | ICD-10-CM

## 2019-09-09 DIAGNOSIS — G894 Chronic pain syndrome: Secondary | ICD-10-CM | POA: Diagnosis not present

## 2019-09-09 DIAGNOSIS — G8929 Other chronic pain: Secondary | ICD-10-CM

## 2019-09-09 DIAGNOSIS — M25512 Pain in left shoulder: Secondary | ICD-10-CM

## 2019-09-09 DIAGNOSIS — Z79899 Other long term (current) drug therapy: Secondary | ICD-10-CM

## 2019-09-09 DIAGNOSIS — M25511 Pain in right shoulder: Secondary | ICD-10-CM

## 2019-09-09 DIAGNOSIS — M7918 Myalgia, other site: Secondary | ICD-10-CM

## 2019-09-09 MED ORDER — METHOCARBAMOL 750 MG PO TABS: 750.0000 mg | ORAL_TABLET | Freq: Three times a day (TID) | ORAL | 5 refills | Status: DC | PRN

## 2019-09-09 NOTE — Patient Instructions (Signed)
____________________________________________________________________________________________  Preparing for Procedure with Sedation  Procedure appointments are limited to planned procedures: . No Prescription Refills. . No disability issues will be discussed. . No medication changes will be discussed.  Instructions: . Oral Intake: Do not eat or drink anything for at least 8 hours prior to your procedure. (Exception: Blood Pressure Medication. See below.) . Transportation: Unless otherwise stated by your physician, you may drive yourself after the procedure. . Blood Pressure Medicine: Do not forget to take your blood pressure medicine with a sip of water the morning of the procedure. If your Diastolic (lower reading)is above 100 mmHg, elective cases will be cancelled/rescheduled. . Blood thinners: These will need to be stopped for procedures. Notify our staff if you are taking any blood thinners. Depending on which one you take, there will be specific instructions on how and when to stop it. . Diabetics on insulin: Notify the staff so that you can be scheduled 1st case in the morning. If your diabetes requires high dose insulin, take only  of your normal insulin dose the morning of the procedure and notify the staff that you have done so. . Preventing infections: Shower with an antibacterial soap the morning of your procedure. . Build-up your immune system: Take 1000 mg of Vitamin C with every meal (3 times a day) the day prior to your procedure. . Antibiotics: Inform the staff if you have a condition or reason that requires you to take antibiotics before dental procedures. . Pregnancy: If you are pregnant, call and cancel the procedure. . Sickness: If you have a cold, fever, or any active infections, call and cancel the procedure. . Arrival: You must be in the facility at least 30 minutes prior to your scheduled procedure. . Children: Do not bring children with you. . Dress appropriately:  Bring dark clothing that you would not mind if they get stained. . Valuables: Do not bring any jewelry or valuables.  Reasons to call and reschedule or cancel your procedure: (Following these recommendations will minimize the risk of a serious complication.) . Surgeries: Avoid having procedures within 2 weeks of any surgery. (Avoid for 2 weeks before or after any surgery). . Flu Shots: Avoid having procedures within 2 weeks of a flu shots or . (Avoid for 2 weeks before or after immunizations). . Barium: Avoid having a procedure within 7-10 days after having had a radiological study involving the use of radiological contrast. (Myelograms, Barium swallow or enema study). . Heart attacks: Avoid any elective procedures or surgeries for the initial 6 months after a "Myocardial Infarction" (Heart Attack). . Blood thinners: It is imperative that you stop these medications before procedures. Let us know if you if you take any blood thinner.  . Infection: Avoid procedures during or within two weeks of an infection (including chest colds or gastrointestinal problems). Symptoms associated with infections include: Localized redness, fever, chills, night sweats or profuse sweating, burning sensation when voiding, cough, congestion, stuffiness, runny nose, sore throat, diarrhea, nausea, vomiting, cold or Flu symptoms, recent or current infections. It is specially important if the infection is over the area that we intend to treat. . Heart and lung problems: Symptoms that may suggest an active cardiopulmonary problem include: cough, chest pain, breathing difficulties or shortness of breath, dizziness, ankle swelling, uncontrolled high or unusually low blood pressure, and/or palpitations. If you are experiencing any of these symptoms, cancel your procedure and contact your primary care physician for an evaluation.  Remember:  Regular Business hours are:    Monday to Thursday 8:00 AM to 4:00 PM  Provider's  Schedule: Devanie Galanti, MD:  Procedure days: Tuesday and Thursday 7:30 AM to 4:00 PM  Bilal Lateef, MD:  Procedure days: Monday and Wednesday 7:30 AM to 4:00 PM ____________________________________________________________________________________________    

## 2019-09-10 ENCOUNTER — Encounter: Payer: Self-pay | Admitting: Pain Medicine

## 2019-09-10 DIAGNOSIS — F419 Anxiety disorder, unspecified: Secondary | ICD-10-CM | POA: Insufficient documentation

## 2019-09-10 DIAGNOSIS — R259 Unspecified abnormal involuntary movements: Secondary | ICD-10-CM | POA: Insufficient documentation

## 2019-09-12 ENCOUNTER — Emergency Department
Admission: EM | Admit: 2019-09-12 | Discharge: 2019-09-12 | Disposition: A | Discharge status: Home / Self Care | Payer: Medicare HMO | Attending: Emergency Medicine | Admitting: Emergency Medicine

## 2019-09-12 ENCOUNTER — Encounter: Payer: Self-pay | Admitting: Emergency Medicine

## 2019-09-12 ENCOUNTER — Other Ambulatory Visit: Payer: Self-pay

## 2019-09-12 DIAGNOSIS — J449 Chronic obstructive pulmonary disease, unspecified: Secondary | ICD-10-CM | POA: Diagnosis not present

## 2019-09-12 DIAGNOSIS — Z87891 Personal history of nicotine dependence: Secondary | ICD-10-CM | POA: Insufficient documentation

## 2019-09-12 DIAGNOSIS — E119 Type 2 diabetes mellitus without complications: Secondary | ICD-10-CM | POA: Diagnosis not present

## 2019-09-12 DIAGNOSIS — E039 Hypothyroidism, unspecified: Secondary | ICD-10-CM | POA: Insufficient documentation

## 2019-09-12 DIAGNOSIS — I130 Hypertensive heart and chronic kidney disease with heart failure and stage 1 through stage 4 chronic kidney disease, or unspecified chronic kidney disease: Secondary | ICD-10-CM | POA: Insufficient documentation

## 2019-09-12 DIAGNOSIS — K623 Rectal prolapse: Secondary | ICD-10-CM

## 2019-09-12 DIAGNOSIS — N183 Chronic kidney disease, stage 3 unspecified: Secondary | ICD-10-CM | POA: Diagnosis not present

## 2019-09-12 DIAGNOSIS — Z79899 Other long term (current) drug therapy: Secondary | ICD-10-CM | POA: Diagnosis not present

## 2019-09-12 DIAGNOSIS — I509 Heart failure, unspecified: Secondary | ICD-10-CM | POA: Insufficient documentation

## 2019-09-12 DIAGNOSIS — K6289 Other specified diseases of anus and rectum: Secondary | ICD-10-CM | POA: Diagnosis present

## 2019-09-12 DIAGNOSIS — I2510 Atherosclerotic heart disease of native coronary artery without angina pectoris: Secondary | ICD-10-CM | POA: Insufficient documentation

## 2019-09-12 LAB — CBC WITH DIFFERENTIAL/PLATELET
Abs Immature Granulocytes: 0.02 10*3/uL (ref 0.00–0.07)
Basophils Absolute: 0 10*3/uL (ref 0.0–0.1)
Basophils Relative: 0 %
Eosinophils Absolute: 0.1 10*3/uL (ref 0.0–0.5)
Eosinophils Relative: 2 %
HCT: 38.4 % (ref 36.0–46.0)
Hemoglobin: 12.3 g/dL (ref 12.0–15.0)
Immature Granulocytes: 0 %
Lymphocytes Relative: 32 %
Lymphs Abs: 1.7 10*3/uL (ref 0.7–4.0)
MCH: 28.3 pg (ref 26.0–34.0)
MCHC: 32 g/dL (ref 30.0–36.0)
MCV: 88.5 fL (ref 80.0–100.0)
Monocytes Absolute: 0.3 10*3/uL (ref 0.1–1.0)
Monocytes Relative: 5 %
Neutro Abs: 3.3 10*3/uL (ref 1.7–7.7)
Neutrophils Relative %: 61 %
Platelets: 231 10*3/uL (ref 150–400)
RBC: 4.34 MIL/uL (ref 3.87–5.11)
RDW: 14.7 % (ref 11.5–15.5)
WBC: 5.5 10*3/uL (ref 4.0–10.5)
nRBC: 0 % (ref 0.0–0.2)

## 2019-09-12 LAB — BASIC METABOLIC PANEL
Anion gap: 12 (ref 5–15)
BUN: 20 mg/dL (ref 6–20)
CO2: 24 mmol/L (ref 22–32)
Calcium: 9 mg/dL (ref 8.9–10.3)
Chloride: 103 mmol/L (ref 98–111)
Creatinine, Ser: 1 mg/dL (ref 0.44–1.00)
GFR calc Af Amer: >60 mL/min (ref >60)
GFR calc non Af Amer: >60 mL/min (ref >60)
Glucose, Bld: 173 mg/dL — ABNORMAL HIGH (ref 70–99)
Potassium: 3.2 mmol/L — ABNORMAL LOW (ref 3.5–5.1)
Sodium: 139 mmol/L (ref 135–145)

## 2019-09-12 LAB — GLUCOSE, CAPILLARY
Glucose-Capillary: 151 mg/dL — ABNORMAL HIGH (ref 70–99)
Glucose-Capillary: 83 mg/dL (ref 70–99)

## 2019-09-12 MED ORDER — LIDOCAINE HCL URETHRAL/MUCOSAL 2 % EX GEL
1.0000 "application " | Freq: Once | CUTANEOUS | Status: AC
  Administered 2019-09-12: 1 via TOPICAL
  Filled 2019-09-12: qty 10

## 2019-09-12 MED ORDER — LIDOCAINE 3 % EX CREA
1.0000 | TOPICAL_CREAM | Freq: Two times a day (BID) | CUTANEOUS | 0 refills | Status: DC | PRN
Start: 2019-09-12 — End: 2019-10-06

## 2019-09-12 NOTE — ED Triage Notes (Signed)
Pt to ED via POV stating that she is having an issue with constipation. Pt reports that she has a prolapsed bowel. Pt reports not being able to use the bathroom since Wednesday. Pt states that she is passing blood and mucus. Pt is in NAD.

## 2019-09-12 NOTE — ED Notes (Signed)
This RN notified by EDT that patient requesting to know if she can eat, this RN explained preferrable to wait until evaluated by EDP. Per Mimi, EDT, pt states she is going to eat anyway due to CBG being 83.

## 2019-09-12 NOTE — ED Notes (Signed)
This RN spoke with Cherlyn Roberts for DG Abdomen. Pt to desk crying, no tears noted. Pt states repeatedly that "it hurts, it hurts". No orders for pain medication at this time. Explained to patient that X-ray of her abdomen had been ordered.

## 2019-09-12 NOTE — ED Notes (Signed)
This RN to bathroom due to patient pulling cord. Pt c/o increasing pain at this time. Pt states she was able to push her bowels back in at this time, pt with swelling noted to rectum at this time.

## 2019-09-12 NOTE — ED Notes (Signed)
Pt can be visualized intermittently moaning out while staring at ED staff. Pt then noted to be able to hold a conversation with other patients without difficulty, sitting on bench at this time.

## 2019-09-12 NOTE — ED Notes (Signed)
Pt requesting to speak with patient relations. Pt ambulatory without difficulty with walker. Pt A&O x4. Pt crying with no visibile tears at this time. Luanne, patient relations speaking with patient at this time.

## 2019-09-12 NOTE — ED Notes (Signed)
Pt reports history of multiple previous abdominal surgeries, with repeat problems. Pt reports ultrasound at [facility] which she reports showed a mild prolapse. Pt reports she has had internal prolapsing and external prolapsing. Pt reports bilateral constant sharp abdominal pain.   Pt reports previously being constipated, but began having diarrhea with mucus and some bright red blood. Pt reports raw anus from repeat wiping.

## 2019-09-12 NOTE — ED Provider Notes (Signed)
[facility] Emergency Department Provider Note  ____________________________________________  Time seen: Approximately 8:14 PM  I have reviewed the triage vital signs and the nursing notes.   HISTORY  Chief Complaint Constipation    HPI Marie Clarke is a 49 y.o. female who presents the emergency department complaining of rectal pain.  Patient has a known rectal prolapse, is scheduled to see surgery in 3 days.  Patient states that she was constipated and the pain had been worsening.  Later in the emergency department patient had 5 bowel movements and states that the consistency is now soft.  She had taken a stool softener and states that the consistency is very soft at this time.  Patient is requesting narcotic pain medication until she can see surgery.   Patient with a history of anemia, bipolar disorder, CHF, COPD, chronic pain syndrome, degenerative disc disease, diabetes, fibromyalgia, panic attacks, osteoporosis, renal insufficiency, restless leg syndrome, TIA, sleep apnea, CVA.  On review of patient's medical records, patient has been seeing pain management.  Evidently patient had received multiple prescriptions from other providers other than her pain management provider and had not notified them of same.  Apparently pain management has discontinued patient's chronic pain medications and she will not be receiving further controlled substances from them.        Past Medical History:  Diagnosis Date  . Anemia   . Anginal pain ([place])   . Anxiety   . Arthritis   . Asthma   . Bilateral lower extremity edema   . Bipolar disorder ([place])   . CAD (coronary artery disease) unk  . CHF (congestive heart failure) ([place])   . Chronic pain syndrome   . COPD (chronic obstructive pulmonary disease) (HCC)    emphysema  . DDD (degenerative disc disease), lumbar   . Depression unk  . Diabetes mellitus without complication ([place])   . Diabetes mellitus, type II ([place])   .  Drug overdose 05/22/13   after dad died  . Dysrhythmia    history of tachy arrythmias  . Fibromyalgia   . GERD (gastroesophageal reflux disease)   . Headache    chronic migraines  . Hyperlipidemia   . Hypertension   . Hypothyroidism   . Left leg pain 04/29/2014  . MI (myocardial infarction) ([place]) 05-22-05  . Muscle ache 09/16/2014  . Osteoporosis   . Overactive bladder   . Pancreatitis unk  . Panic attacks   . PTSD (post-traumatic stress disorder)   . Reflex sympathetic dystrophy   . Renal cyst, left   . Renal insufficiency    ckd stage iii  . Restless legs syndrome   . Sleep apnea 06/2019   uses cpap  . Sleep apnea   . Stroke ([place]) 05/22/2008   TIA x 2. no residual deficits  . Suicide attempt [facility])    after father died.  . Thyroid disease    thyroid nodule  . TIA (transient ischemic attack) unk  . TIA (transient ischemic attack)     Patient Active Problem List   Diagnosis Date Noted  . Abnormal involuntary movement 08/18/2019  . Bipolar disorder, in full remission, most recent episode mixed ([place]) 08/11/2019  . Lumbosacral radiculopathy at L5 (Right) 04/09/2019  . Chronic migraine 03/12/2019  . Gout 01/21/2019  . Noncompliance with treatment regimen 12/29/2018  . BMI 40.0-44.9, adult ([place]) 11/19/2018  . AKI (acute kidney injury) ([place]) 11/17/2018  . Postural dizziness with presyncope 09/29/2018  . Bipolar I disorder, most recent episode mixed (  [place]) 09/10/2018  . Panic attacks 09/10/2018  . Chronic lumbosacral L5-S1 IVD protrusion (Bilateral) 08/25/2018  . Lumbar lateral recess stenosis (L5-S1) (Bilateral) 08/25/2018  . Wound disruption 08/05/2018  . Osteoarthritis involving multiple joints 07/10/2018  . Long term current use of non-steroidal anti-inflammatories (NSAID) 07/10/2018  . NSAID induced gastritis 07/10/2018  . DDD (degenerative disc disease), lumbosacral 04/22/2018  . Disease related peripheral neuropathy 11/13/2017  . Gastroesophageal reflux disease without  esophagitis 11/13/2017  . Occipital headache (Bilateral) 11/13/2017  . Cervicogenic headache (Bilateral) (L>R) 11/13/2017  . History of postoperative nausea 10/22/2017  . Chronic shoulder pain (Fifth Area of Pain) (Bilateral) (L>R) 10/09/2017  . CRPS (complex regional pain syndrome) type 1 of lower limb (left ankle) 10/09/2017  . Ankle joint instability (Left) 10/09/2017  . Ankle sprain, sequela (Left) 10/09/2017  . History of psychiatric symptoms 10/09/2017  . Chronic pain syndrome 10/02/2017  . Spondylosis without myelopathy or radiculopathy, lumbosacral region 10/02/2017  . Chronic musculoskeletal pain 10/02/2017  . Elevated C-reactive protein (CRP) 09/10/2017  . Elevated sed rate 09/10/2017  . Chronic neck pain (Fourth Area of Pain) (Bilateral) (L>R) 09/09/2017  . Pharmacologic therapy 09/09/2017  . Disorder of skeletal system 09/09/2017  . Problems influencing health status 09/09/2017  . Long term current use of opiate analgesic 09/09/2017  . Tobacco use disorder 07/16/2017  . Ventral hernia without obstruction or gangrene 06/25/2017  . Strain of extensor muscle, fascia and tendon of left index finger at wrist and hand level, initial encounter 05/16/2017  . Sepsis ([place]) 04/14/2017  . Osteopenia 04/03/2017  . Hypotension 09/17/2016  . Contusion of knee (Left) 10/12/2015  . Strain of knee (Left) 10/12/2015  . Incidental lung nodule 04/28/2015  . Chronic low back pain (Primary Area of Pain) (Bilateral) (L>R) 03/28/2015  . Chronic lower extremity pain (Referred) (Secondary Area of Pain) (Left) 03/28/2015  . Abdominal wound dehiscence 03/28/2015  . Encounter for pain management planning 03/28/2015  . Morbid obesity ([place]) 03/28/2015  . Abnormal CT scan, lumbar spine 03/28/2015  . Lumbar facet hypertrophy 03/28/2015  . Lumbar facet syndrome (Bilateral) (L>R) 03/28/2015  . Lumbar foraminal stenosis (Bilateral) (L5-S1) 03/28/2015  . Chronic ankle pain [facility] Area of Pain) (Left)  03/28/2015  . Neurogenic pain 03/28/2015  . Neuropathic pain 03/28/2015  . Myofascial pain 03/28/2015  . History of suicide attempt 03/28/2015  . PTSD (post-traumatic stress disorder) 01/13/2015  . Abnormal gait 12/15/2014  . Congestive heart failure ([place]) 11/15/2014  . Abdominal wall abscess 09/20/2014  . 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 Diabetes mellitus, type 2 ([place]) 08/13/2014  . Bipolar affective disorder ([place]) 08/13/2014  . Type 2 diabetes mellitus ([place]) 08/13/2014  . Rectal prolapse 08/09/2014  . Rectal bleeding 08/09/2014  . Rectal bleed 08/09/2014  . Affective bipolar disorder ([place]) 08/05/2014  . Arteriosclerosis of coronary artery 08/05/2014  . CCF (congestive cardiac failure) ([place]) 08/05/2014  . Chronic kidney disease 08/05/2014  . 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 Apnea, sleep 08/05/2014  . Temporary cerebral vascular dysfunction 08/05/2014  . Polypharmacy 04/29/2014  . Other long term (current) drug therapy 04/29/2014  . 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 Chronic kidney disease, stage III (moderate) ([place]) 12/14/2013  . Controlled diabetes mellitus type II without complication ([place]) 16/11/9602  . Essential (primary) hypertension 12/03/2013  . Adult hypothyroidism 12/03/2013  . Controlled type 2 diabetes mellitus without complication ([place]) 54/10/8117    Past Surgical History:  Procedure Laterality Date  . ABDOMINAL HYSTERECTOMY    . CARDIAC CATHETERIZATION    .  HERNIA REPAIR  07/15/2017   [facility]  . lumbar facet     several  . prolapse rectum surgery N/A July 2016  . THORACIC LAMINECTOMY FOR SPINAL CORD STIMULATOR N/A 07/27/2019   Procedure: THORACIC SPINAL CORD STIMULATOR PLACEMENT WITH RIGHT FLANK PULSE GENERATOR;  Surgeon: [name], MD;  Location: [facility] ORS;  Service: Neurosurgery;  Laterality: N/A;  . TONSILLECTOMY      Prior to Admission medications   Medication Sig Start Date End Date Taking? Authorizing Provider  atorvastatin (LIPITOR) 40  MG tablet Take 40 mg by mouth at bedtime.     [provider]  B Complex-C (B-COMPLEX WITH VITAMIN C) tablet Take by mouth.    [provider]  busPIRone (BUSPAR) 15 MG tablet Take 1 tablet (15 mg total) by mouth 2 (two) times daily. 06/17/19   Ursula Alert, MD  celecoxib (CELEBREX) 100 MG capsule Take by mouth. 06/29/19   [provider]  Cholecalciferol (VITAMIN D3) 125 MCG (5000 UT) TABS Take by mouth.    [provider]  colchicine (COLCRYS) 0.6 MG tablet as needed. For gout flare up    [provider]  Cysteamine Bitartrate (PROCYSBI) 300 MG PACK Use 1 each 3 (three) times daily One Touch Single Use.  Use as instructed. 05/26/19 05/25/20  [provider]  darifenacin (ENABLEX) 7.5 MG 24 hr tablet Take 7.5 mg by mouth at bedtime. Use as needed for OAB 03/18/19 03/17/20  [provider]  desvenlafaxine (PRISTIQ) 50 MG 24 hr tablet Take 1 tablet (50 mg total) by mouth at bedtime. 08/11/19   Ursula Alert, MD  dicyclomine (BENTYL) 10 MG capsule as needed.     [provider]  diphenoxylate-atropine (LOMOTIL) 2.5-0.025 MG tablet Take by mouth. 12/25/17   [provider]  Dulaglutide 0.75 MG/0.5ML SOPN Inject 0.75 mg into the skin every Saturday.  07/15/18   [provider]  famotidine (PEPCID) 20 MG tablet Take 20 mg by mouth daily. 04/01/19   [provider]  fluticasone (FLONASE) 50 MCG/ACT nasal spray Place into the nose. 06/03/19   [provider]  gabapentin (NEURONTIN) 600 MG tablet Take by mouth 3 (three) times daily.  04/21/19 04/20/20  [provider]  glucose blood test strip OneTouch Verio test strips    [provider]  hydrOXYzine (ATARAX/VISTARIL) 25 MG tablet Take 1 tablet (25 mg total) by mouth every 6 (six) hours as needed. Patient taking differently: Take 25 mg by mouth every 6 (six) hours as needed for itching. Primarily for itching 06/17/19   Ursula Alert, MD   KLOR-CON M20 20 MEQ tablet Take 0.5 tablets (10 mEq total) by mouth 2 (two) times daily. Patient taking differently: Take 20 mEq by mouth daily. But can take twice a day if needed 11/18/18   Mayo, Pete Pelt, MD  Lancets (ACCU-CHEK SAFE-T PRO) lancets  11/21/18   [provider]  levocetirizine (XYZAL) 5 MG tablet Take 5 mg by mouth daily. Takes at night 06/04/18   [provider]  levothyroxine (SYNTHROID) 50 MCG tablet Take 25 mcg by mouth daily. (259mcg total daily) 09/03/18   [provider]  levothyroxine (SYNTHROID, LEVOTHROID) 200 MCG tablet Take 200 mcg by mouth daily. (250mcg total daily)    [provider]  Lidocaine 3 % CREA Apply 1 application topically 2 (two) times daily as needed (pain). 09/12/19   Zamirah Denny, Charline Bills, PA-C  methocarbamol (ROBAXIN) 750 MG tablet Take 1 tablet (750 mg total) by mouth every 8 (  eight) hours as needed for muscle spasms. 09/09/19 03/07/20  Milinda Pointer, MD  metoprolol succinate (TOPROL-XL) 25 MG 24 hr tablet Take 25 mg by mouth daily. Takes in the evening 02/27/19   [provider]  montelukast (SINGULAIR) 10 MG tablet Take 10 mg by mouth daily. Takes in the afternoon    [provider]  nitroGLYCERIN (NITROSTAT) 0.4 MG SL tablet Place 0.4 mg under the tongue every 5 (five) minutes as needed for chest pain.     [provider]  Olopatadine HCl 0.2 % SOLN  01/01/19   [provider]  ondansetron (ZOFRAN-ODT) 8 MG disintegrating tablet Take 8 mg by mouth every 8 (eight) hours as needed for nausea or vomiting.    [provider]  Oxycodone HCl 10 MG TABS Take by mouth. 09/06/19   [provider]  QUEtiapine (SEROQUEL) 100 MG tablet Take 1 tablet (100 mg total) by mouth at bedtime. Dose reduction 08/18/19   Ursula Alert, MD  rizatriptan (MAXALT) 10 MG tablet Take 1 tablet (10 mg total) by mouth as needed. 01/21/19 09/08/19  Milinda Pointer, MD  rOPINIRole (REQUIP) 0.25  MG tablet Take 1 tablet by mouth in the morning, at noon, and at bedtime. 03/29/19 03/28/20  [provider]  topiramate (TOPAMAX) 50 MG tablet Take 50 mg by mouth 2 (two) times daily.    [provider]  torsemide (DEMADEX) 20 MG tablet Take 1 tablet (20 mg total) by mouth 2 (two) times daily. 11/18/18 09/08/19  Mayo, Pete Pelt, MD  traMADol (ULTRAM) 50 MG tablet Take 50 mg by mouth every 6 (six) hours as needed. 1-2 every 6 hours prn    [provider]  TRELEGY ELLIPTA 100-62.5-25 MCG/INH AEPB Inhale 1 puff into the lungs daily. Takes at night    [provider]  VENTOLIN HFA 108 (90 Base) MCG/ACT inhaler Inhale 1-2 puffs into the lungs every 6 (six) hours as needed for wheezing or shortness of breath.  05/03/18   [provider]  vitamin E 400 UNIT capsule 400 Units daily.     [provider]    Allergies Diazepam, Ziprasidone hcl, Azithromycin, Divalproex sodium, Levofloxacin, Lisinopril, Metronidazole, Sulfa antibiotics, Valproic acid, Cephalexin, Ciprofloxacin, and Penicillins  Family History  Problem Relation Age of Onset  . Diabetes Mellitus II Mother   . CAD Mother   . Sleep apnea Mother   . Osteoarthritis Mother   . Osteoporosis Mother   . Anxiety disorder Mother   . Depression Mother   . Bipolar disorder Mother   . Bipolar disorder Father   . Hypertension Father   . Depression Father   . Anxiety disorder Father   . Post-traumatic stress disorder Sister     Social History Social History   Tobacco Use  . Smoking status: Former Smoker    Packs/day: 0.50    Types: Cigarettes    Quit date: 09/01/2018    Years since quitting: 1.0  . Smokeless tobacco: Never Used  . Tobacco comment: Patient quit smoking 09/01/2018  Vaping Use  . Vaping Use: Never used  Substance Use Topics  . Alcohol use: No    Alcohol/week: 0.0 standard drinks  . Drug use: No     Review of Systems  Constitutional: No fever/chills Eyes: No visual  changes. No discharge ENT: No upper respiratory complaints. Cardiovascular: no chest pain. Respiratory: no cough. No SOB. Gastrointestinal: No abdominal pain.  No nausea, no vomiting.  No diarrhea.  No constipation.  Rectal prolapse  Musculoskeletal: Negative for musculoskeletal pain. Skin: Negative for rash, abrasions, lacerations, ecchymosis. Neurological: Negative for headaches, focal weakness or numbness. 10-point ROS otherwise negative.  ____________________________________________   PHYSICAL EXAM:  VITAL SIGNS: ED Triage Vitals  Enc Vitals Group     BP 09/12/19 1338 95/67     Pulse Rate 09/12/19 1334 94     Resp 09/12/19 1334 16     Temp 09/12/19 1334 97.7 F (36.5 C)     Temp Source 09/12/19 1334 Oral     SpO2 09/12/19 1334 97 %     Weight 09/12/19 1338 (!) 240 lb (108.9 kg)     Height 09/12/19 1338 5\' 4"  (1.626 m)     Head Circumference --      Peak Flow --      Pain Score 09/12/19 1338 10     Pain Loc --      Pain Edu? --      Excl. in [place]? --      Constitutional: Alert and oriented. Well appearing and in no acute distress. Eyes: Conjunctivae are normal. PERRL. EOMI. Head: Atraumatic. ENT:      Ears:       Nose: No congestion/rhinnorhea.      Mouth/Throat: Mucous membranes are moist.  Neck: No stridor.    Cardiovascular: Normal rate, regular rhythm. Normal S1 and S2.  Good peripheral circulation. Respiratory: Normal respiratory effort without tachypnea or retractions. Lungs CTAB. Good air entry to the bases with no decreased or absent breath sounds. Gastrointestinal: Bowel sounds 4 quadrants. Soft and nontender to palpation. No guarding or rigidity. No palpable masses. No distention. No CVA tenderness.  No evidence of prolapse.  Some mild irritation to the skin but not consistent with cellulitis.  No evidence of hemorrhoids. Musculoskeletal: Full range of motion to all extremities. No gross deformities appreciated. Neurologic:  Normal speech and language. No  gross focal neurologic deficits are appreciated.  Skin:  Skin is warm, dry and intact. No rash noted. Psychiatric: Mood and affect are normal. Speech and behavior are normal. Patient exhibits appropriate insight and judgement.   ____________________________________________   LABS (all labs ordered are listed, but only abnormal results are displayed)  Labs Reviewed  GLUCOSE, CAPILLARY - Abnormal; Notable for the following components:      Result Value   Glucose-Capillary 151 (*)    All other components within normal limits  BASIC METABOLIC PANEL - Abnormal; Notable for the following components:   Potassium 3.2 (*)    Glucose, Bld 173 (*)    All other components within normal limits  CBC WITH DIFFERENTIAL/PLATELET  GLUCOSE, CAPILLARY   ____________________________________________  EKG   ____________________________________________  RADIOLOGY   No results found.  ____________________________________________    PROCEDURES  Procedure(s) performed:    Procedures    Medications  lidocaine (XYLOCAINE) 2 % jelly 1 application (has no administration in time range)     ____________________________________________   INITIAL IMPRESSION / ASSESSMENT AND PLAN / ED COURSE  Pertinent labs & imaging results that were available during my care of the patient were reviewed by me and considered in my medical decision making (see chart for details).  Review of the [place] CSRS was performed in accordance of the [place] prior to dispensing any controlled drugs.           Patient's diagnosis is consistent with rectal prolapse.  Patient presented to emergency department complaining of symptoms consistent with a rectal prolapse.  Patient has a known rectal prolapse and is scheduled  to see surgery in 3 days.  Patient states that she was having constipation with increased rectal pain.  Pain had improved somewhat after patient had 5 bowel movements here in the emergency department.   Patient was asking for pain medication but patient has been stopped by her pain management doctor due to seeking prescriptions elsewhere.  At this time I will not prescribe any narcotics as this would worsen her constipation and her pain management specialist has discontinued her controlled substances.  No evidence of prolapse on exam.  Patient is advised that she may use topical lidocaine for skin irritation from frequent bowel movements but should not use it on any exposed bowel (if she has a recurrent prolapse).  Continue stool softener at home.  Drink plenty of fluids, eat fiber-containing foods..  Follow-up with surgery in 3 days for her rectal prolapse.  Patient is given ED precautions to return to the ED for any worsening or new symptoms.     ____________________________________________  FINAL CLINICAL IMPRESSION(S) / ED DIAGNOSES  Final diagnoses:  Rectal prolapse      NEW MEDICATIONS STARTED DURING THIS VISIT:  ED Discharge Orders         Ordered    Lidocaine 3 % CREA  2 times daily PRN     Discontinue  Reprint     09/12/19 2022              This chart was dictated using voice recognition software/Dragon. Despite best efforts to proofread, errors can occur which can change the meaning. Any change was purely unintentional.    Darletta Moll, PA-C 09/12/19 2024    Nance Pear, MD 09/12/19 2103

## 2019-09-15 NOTE — Progress Notes (Signed)
Hello,          Jocelyn Lamer,                This patient requires prior auth before scheduling. In which [employer] will be handling the prior auth. [employer] stated that normally you schedule the SCS trail. I'm wondering what's required of me concerning this pts appt?                                                                                                             Thanks

## 2019-09-17 ENCOUNTER — Encounter: Payer: Self-pay | Admitting: Pain Medicine

## 2019-09-17 NOTE — Progress Notes (Signed)
I'm not sure what exactly you are asking. As with all patients that need prior authorization., Send to [place] and yes I will schedule SCS trials.

## 2019-09-18 ENCOUNTER — Encounter: Payer: Self-pay | Admitting: Pain Medicine

## 2019-09-18 ENCOUNTER — Other Ambulatory Visit: Payer: Self-pay | Admitting: Pain Medicine

## 2019-09-18 NOTE — Progress Notes (Signed)
Hello,         I reached out to the pt to schedule her procedure and to set her up with [facility]. Patient refused the services, and went on to state that she's upset with Dr. Dossie Arbour.                                                                                 Thanks

## 2019-09-21 ENCOUNTER — Other Ambulatory Visit: Payer: Self-pay

## 2019-09-21 ENCOUNTER — Ambulatory Visit: Payer: Medicare HMO | Attending: Pain Medicine | Admitting: Pain Medicine

## 2019-09-21 DIAGNOSIS — T85848A Pain due to other internal prosthetic devices, implants and grafts, initial encounter: Secondary | ICD-10-CM | POA: Diagnosis not present

## 2019-09-21 NOTE — Progress Notes (Addendum)
Patient: Marie Clarke  Service Category: E/M  Provider: Gaspar Cola, MD  DOB: [birth year]  DOS: 09/21/2019  Referring Provider: Sharyne Peach, MD  MRN: [id number]  Setting: Ambulatory outpatient  PCP: Sharyne Peach, MD  Type: Established Patient  Specialty: Interventional Pain Management    Location: Office  Delivery: Face-to-face     Purpose:  The patient comes in today for SCS implant evaluation.. Case was discussed with attending physician.    Subjective:  Marie Clarke is a 49 y.o. year old, female patient, who comes today for a nurse visit complaining of No chief complaint on file. Her last contact with [place] was on 09/18/2019.  The patient called earlier today indicating that she was having some swelling in the area of the spinal cord stimulator battery.  We brought the patient in to check on this.  The area looks good with no evidence of redness, swelling, or puffiness.  She does have a little bit of tenderness right over the scar, likely to be secondary to the normal absorption of the "absorbable sutures".  The patient was reassured that everything looks good.  No increased temperature or any other evidence that would suggest a reaction or infection.    No notes on file    Objective:  Marie Clarke  vitals were not taken for this visit.  There is no height or weight on file to calculate BMI.  Analgesic:  No opioid analgesics prescribed by our practice.I have discontinue all opioid analgesics on 09/09/2019 secondary to the patient asked this regard towards our medication rules and regulations.  This patient is High-Risk due to history of a prior suicidal attempt w/ medications.  The PMP reveals that she has obtained opioid analgesic medications from several physicians, without notifying our practice of this. MME/day:0mg /day.     Allergies:  Patient is allergic to diazepam, ziprasidone hcl, azithromycin, divalproex sodium, levofloxacin, lisinopril, metronidazole, sulfa antibiotics,  valproic acid, cephalexin, ciprofloxacin, and penicillins.    Labs:  Lab Results  Component Value Date   BUN 20 09/12/2019   CREATININE 1.00 09/12/2019   GFRAA >60 09/12/2019   GFRNONAA >60 09/12/2019      Assessment:  There were no encounter diagnoses.    Attestation: Medical screening examination/treatment/procedure(s) were performed by non-physician practitioner and as supervising physician I was immediately available for consultation/collaboration.    Plan of Care  Orders:  No orders of the defined types were placed in this encounter.  Chronic Opioid Analgesic:   No opioid analgesics prescribed by our practice.I have discontinue all opioid analgesics on 09/09/2019 secondary to the patient's disregard towards our medication rules and regulations.  High-Risk due to history of a prior suicidal attempt w/ medications.  PMP revealed obtaining undisclosed opioid analgesics from several providers. MME/day:0mg /day.   Medications ordered for procedure: No orders of the defined types were placed in this encounter.  Medications administered: [facility] had no medications administered during this visit.  See the medical record for exact dosing, route, and time of administration.  Follow-up plan:   Return if symptoms worsen or fail to improve.       Interventional management options:  Considering:   NOTE: The patient is medical psychology evaluation indicates that the patient is a "suitable candidate" for a spinal cord stimulator trial. Possible lumbar spinal cord stimulator implant(Trail done 04/22/2019.  She is currently pending implant.)  Diagnosticbilateral lumbar facet block Possible bilateral lumbar facetRFA Diagnostic left L5 TFESI Diagnostic left L5 SNRB Possible left L5 DRG  RFA Diagnostic bilateral cervical facet block Possible bilateral cervical facetRFA Diagnosticleft IA shoulder injection Diagnosticleft suprascapular NB Diagnostic left  suprascapularRFA Diagnostic left lumbar sympathetic block (LSB) #1  Possible left lumbar sympathetic RFA   Palliative PRN treatment(s):   Therapeutic/palliative left L5 TFESI #2 + right L4-5 LESI #2 (100/100/50/100) Palliative bilateral lumbar facet blocks Palliative right lumbar facet RFA #2 (last done 04/22/2018) Palliative left lumbar facet RFA #2 (last done 03/13/2018)      Recent Visits Date Type Provider Dept  09/09/19 Telemedicine Milinda Pointer, MD [facility]  06/29/19 Telemedicine Milinda Pointer, MD [facility]  Showing recent visits within past 90 days and meeting all other requirements Today's Visits Date Type Provider Dept  09/21/19 Office Visit Milinda Pointer, MD [facility]  Showing today's visits and meeting all other requirements Future Appointments No visits were found meeting these conditions. Showing future appointments within next 90 days and meeting all other requirements  Disposition: Discharge home  Discharge (Date  Time): 09/21/2019;   hrs.   Primary Care Physician: Sharyne Peach, MD Location: [facility] Note by: Gaspar Cola, MD Date: 09/21/2019; Time: 1:05 PM  Disclaimer:  Medicine is not an Chief Strategy Officer. The only guarantee in medicine is that nothing is guaranteed. It is important to note that the decision to proceed with this intervention was based on the information collected from the patient. The Data and conclusions were drawn from the patient's questionnaire, the interview, and the physical examination. Because the information was provided in large part by the patient, it cannot be guaranteed that it has not been purposely or unconsciously manipulated. Every effort has been made to obtain as much relevant data as possible for this evaluation. It is important to note that the conclusions that lead to this procedure are derived in large part from the available data. Always  take into account that the treatment will also be dependent on availability of resources and existing treatment guidelines, considered by other Pain Management Practitioners as being common knowledge and practice, at the time of the intervention. For Medico-Legal purposes, it is also important to point out that variation in procedural techniques and pharmacological choices are the acceptable norm. The indications, contraindications, technique, and results of the above procedure should only be interpreted and judged by a Board-Certified Interventional Pain Specialist with extensive familiarity and expertise in the same exact procedure and technique.

## 2019-09-22 ENCOUNTER — Encounter: Payer: Self-pay | Admitting: Pain Medicine

## 2019-09-22 ENCOUNTER — Ambulatory Visit: Payer: Medicare HMO | Admitting: Pain Medicine

## 2019-09-22 ENCOUNTER — Telehealth: Payer: Self-pay

## 2019-09-22 NOTE — Telephone Encounter (Signed)
Dr. Dossie Arbour ordered an LSB for this patient. I did the insurance auth, called the patient and she was quite upset stating that we told her if she got the stimulator that she wouldn't have to get any more shots and then we stopped her medicine and she knows people that go to [facility] and we don't stop their medicines so why did you stop hers? She states we need to give her the tramadol because we told her she wouldn't have to get any more shots. I couldn't continue to debate with her so I politely told her I would send a note back, and thank you  and I hung up.

## 2019-09-25 ENCOUNTER — Other Ambulatory Visit: Payer: Self-pay | Admitting: Pain Medicine

## 2019-09-25 DIAGNOSIS — G90522 Complex regional pain syndrome I of left lower limb: Secondary | ICD-10-CM

## 2019-09-25 DIAGNOSIS — M545 Low back pain, unspecified: Secondary | ICD-10-CM

## 2019-09-25 DIAGNOSIS — G894 Chronic pain syndrome: Secondary | ICD-10-CM

## 2019-09-25 DIAGNOSIS — R269 Unspecified abnormalities of gait and mobility: Secondary | ICD-10-CM

## 2019-09-25 DIAGNOSIS — G8929 Other chronic pain: Secondary | ICD-10-CM

## 2019-09-28 ENCOUNTER — Telehealth: Payer: Self-pay | Admitting: *Deleted

## 2019-09-28 NOTE — Telephone Encounter (Signed)
Called patient and she states that her PCP has already taken care of the FCE and the disability issue.

## 2019-10-01 ENCOUNTER — Encounter: Payer: Self-pay | Admitting: Pain Medicine

## 2019-10-06 ENCOUNTER — Ambulatory Visit: Payer: Medicare HMO | Attending: Pain Medicine | Admitting: Pain Medicine

## 2019-10-06 ENCOUNTER — Encounter: Payer: Self-pay | Admitting: Pain Medicine

## 2019-10-06 ENCOUNTER — Other Ambulatory Visit: Payer: Self-pay

## 2019-10-06 VITALS — BP 115/76 | HR 103 | Temp 97.3°F | Resp 16 | Ht 64.0 in | Wt 230.0 lb

## 2019-10-06 DIAGNOSIS — M8949 Other hypertrophic osteoarthropathy, multiple sites: Secondary | ICD-10-CM | POA: Diagnosis present

## 2019-10-06 DIAGNOSIS — G90522 Complex regional pain syndrome I of left lower limb: Secondary | ICD-10-CM

## 2019-10-06 DIAGNOSIS — M25372 Other instability, left ankle: Secondary | ICD-10-CM | POA: Diagnosis present

## 2019-10-06 DIAGNOSIS — Z9682 Presence of neurostimulator: Secondary | ICD-10-CM | POA: Insufficient documentation

## 2019-10-06 DIAGNOSIS — M159 Polyosteoarthritis, unspecified: Secondary | ICD-10-CM

## 2019-10-06 DIAGNOSIS — M25572 Pain in left ankle and joints of left foot: Secondary | ICD-10-CM | POA: Insufficient documentation

## 2019-10-06 DIAGNOSIS — G894 Chronic pain syndrome: Secondary | ICD-10-CM

## 2019-10-06 DIAGNOSIS — M545 Low back pain, unspecified: Secondary | ICD-10-CM

## 2019-10-06 DIAGNOSIS — M79605 Pain in left leg: Secondary | ICD-10-CM

## 2019-10-06 DIAGNOSIS — M8900 Algoneurodystrophy, unspecified site: Secondary | ICD-10-CM | POA: Diagnosis present

## 2019-10-06 DIAGNOSIS — G8929 Other chronic pain: Secondary | ICD-10-CM

## 2019-10-06 DIAGNOSIS — S93402S Sprain of unspecified ligament of left ankle, sequela: Secondary | ICD-10-CM | POA: Diagnosis present

## 2019-10-06 DIAGNOSIS — M150 Primary generalized (osteo)arthritis: Secondary | ICD-10-CM

## 2019-10-06 MED ORDER — MELOXICAM 15 MG PO TABS: 15.0000 mg | ORAL_TABLET | Freq: Every day | ORAL | 0 refills | Status: DC

## 2019-10-06 NOTE — Progress Notes (Signed)
Nursing Pain Medication Assessment:  Safety precautions to be maintained throughout the outpatient stay will include: orient to surroundings, keep bed in low position, maintain call bell within reach at all times, provide assistance with transfer out of bed and ambulation.  Medication Inspection Compliance: Marie Clarke did not comply with our request to bring her pills to be counted. She was reminded that bringing the medication bottles, even when empty, is a requirement.  Medication: None brought in. Pill/Patch Count: None available to be counted. Bottle Appearance: No container available. Did not bring bottle(s) to appointment. Filled Date: N/A Last Medication intake:  Today

## 2019-10-06 NOTE — Patient Instructions (Addendum)
____________________________________________________________________________________________  Preparing for Procedure with Sedation  Procedure appointments are limited to planned procedures: . No Prescription Refills. . No disability issues will be discussed. . No medication changes will be discussed.  Instructions: . Oral Intake: Do not eat or drink anything for at least 8 hours prior to your procedure. (Exception: Blood Pressure Medication. See below.) . Transportation: Unless otherwise stated by your physician, you may drive yourself after the procedure. . Blood Pressure Medicine: Do not forget to take your blood pressure medicine with a sip of water the morning of the procedure. If your Diastolic (lower reading)is above 100 mmHg, elective cases will be cancelled/rescheduled. . Blood thinners: These will need to be stopped for procedures. Notify our staff if you are taking any blood thinners. Depending on which one you take, there will be specific instructions on how and when to stop it. . Diabetics on insulin: Notify the staff so that you can be scheduled 1st case in the morning. If your diabetes requires high dose insulin, take only  of your normal insulin dose the morning of the procedure and notify the staff that you have done so. . Preventing infections: Shower with an antibacterial soap the morning of your procedure. . Build-up your immune system: Take 1000 mg of Vitamin C with every meal (3 times a day) the day prior to your procedure. . Antibiotics: Inform the staff if you have a condition or reason that requires you to take antibiotics before dental procedures. . Pregnancy: If you are pregnant, call and cancel the procedure. . Sickness: If you have a cold, fever, or any active infections, call and cancel the procedure. . Arrival: You must be in the facility at least 30 minutes prior to your scheduled procedure. . Children: Do not bring children with you. . Dress appropriately:  Bring dark clothing that you would not mind if they get stained. . Valuables: Do not bring any jewelry or valuables.  Reasons to call and reschedule or cancel your procedure: (Following these recommendations will minimize the risk of a serious complication.) . Surgeries: Avoid having procedures within 2 weeks of any surgery. (Avoid for 2 weeks before or after any surgery). . Flu Shots: Avoid having procedures within 2 weeks of a flu shots or . (Avoid for 2 weeks before or after immunizations). . Barium: Avoid having a procedure within 7-10 days after having had a radiological study involving the use of radiological contrast. (Myelograms, Barium swallow or enema study). . Heart attacks: Avoid any elective procedures or surgeries for the initial 6 months after a "Myocardial Infarction" (Heart Attack). . Blood thinners: It is imperative that you stop these medications before procedures. Let us know if you if you take any blood thinner.  . Infection: Avoid procedures during or within two weeks of an infection (including chest colds or gastrointestinal problems). Symptoms associated with infections include: Localized redness, fever, chills, night sweats or profuse sweating, burning sensation when voiding, cough, congestion, stuffiness, runny nose, sore throat, diarrhea, nausea, vomiting, cold or Flu symptoms, recent or current infections. It is specially important if the infection is over the area that we intend to treat. . Heart and lung problems: Symptoms that may suggest an active cardiopulmonary problem include: cough, chest pain, breathing difficulties or shortness of breath, dizziness, ankle swelling, uncontrolled high or unusually low blood pressure, and/or palpitations. If you are experiencing any of these symptoms, cancel your procedure and contact your primary care physician for an evaluation.  Remember:  Regular Business hours are:    Monday to Thursday 8:00 AM to 4:00 PM  Provider's  Schedule: Milinda Pointer, MD:  Procedure days: Tuesday and Thursday 7:30 AM to 4:00 PM  Gillis Santa, MD:  Procedure days: Monday and Wednesday 7:30 AM to 4:00 PM ____________________________________________________________________________________________   ____________________________________________________________________________________________  Preparing for Procedure with Sedation  Procedure appointments are limited to planned procedures: . No Prescription Refills. . No disability issues will be discussed. . No medication changes will be discussed.  Instructions: . Oral Intake: Do not eat or drink anything for at least 8 hours prior to your procedure. (Exception: Blood Pressure Medication. See below.) . Transportation: Unless otherwise stated by your physician, you may drive yourself after the procedure. . Blood Pressure Medicine: Do not forget to take your blood pressure medicine with a sip of water the morning of the procedure. If your Diastolic (lower reading)is above 100 mmHg, elective cases will be cancelled/rescheduled. . Blood thinners: These will need to be stopped for procedures. Notify our staff if you are taking any blood thinners. Depending on which one you take, there will be specific instructions on how and when to stop it. . Diabetics on insulin: Notify the staff so that you can be scheduled 1st case in the morning. If your diabetes requires high dose insulin, take only  of your normal insulin dose the morning of the procedure and notify the staff that you have done so. . Preventing infections: Shower with an antibacterial soap the morning of your procedure. . Build-up your immune system: Take 1000 mg of Vitamin C with every meal (3 times a day) the day prior to your procedure. Marland Kitchen Antibiotics: Inform the staff if you have a condition or reason that requires you to take antibiotics before dental procedures. . Pregnancy: If you are pregnant, call and cancel the  procedure. . Sickness: If you have a cold, fever, or any active infections, call and cancel the procedure. . Arrival: You must be in the facility at least 30 minutes prior to your scheduled procedure. . Children: Do not bring children with you. . Dress appropriately: Bring dark clothing that you would not mind if they get stained. . Valuables: Do not bring any jewelry or valuables.  Reasons to call and reschedule or cancel your procedure: (Following these recommendations will minimize the risk of a serious complication.) . Surgeries: Avoid having procedures within 2 weeks of any surgery. (Avoid for 2 weeks before or after any surgery). . Flu Shots: Avoid having procedures within 2 weeks of a flu shots or . (Avoid for 2 weeks before or after immunizations). . Barium: Avoid having a procedure within 7-10 days after having had a radiological study involving the use of radiological contrast. (Myelograms, Barium swallow or enema study). . Heart attacks: Avoid any elective procedures or surgeries for the initial 6 months after a "Myocardial Infarction" (Heart Attack). . Blood thinners: It is imperative that you stop these medications before procedures. Let us know if you if you take any blood thinner.  . Infection: Avoid procedures during or within two weeks of an infection (including chest colds or gastrointestinal problems). Symptoms associated with infections include: Localized redness, fever, chills, night sweats or profuse sweating, burning sensation when voiding, cough, congestion, stuffiness, runny nose, sore throat, diarrhea, nausea, vomiting, cold or Flu symptoms, recent or current infections. It is specially important if the infection is over the area that we intend to treat. Marland Kitchen Heart and lung problems: Symptoms that may suggest an active cardiopulmonary problem include: cough, chest pain, breathing  difficulties or shortness of breath, dizziness, ankle swelling, uncontrolled high or unusually low  blood pressure, and/or palpitations. If you are experiencing any of these symptoms, cancel your procedure and contact your primary care physician for an evaluation.  Remember:  Regular Business hours are:  Monday to Thursday 8:00 AM to 4:00 PM  Provider's Schedule: Milinda Pointer, MD:  Procedure days: Tuesday and Thursday 7:30 AM to 4:00 PM  Gillis Santa, MD:  Procedure days: Monday and Wednesday 7:30 AM to 4:00 PM ____________________________________________________________________________________________  ____________________________________________________________________________________________  Preparing for Procedure with Sedation  Procedure appointments are limited to planned procedures: . No Prescription Refills. . No disability issues will be discussed. . No medication changes will be discussed.  Instructions: . Oral Intake: Do not eat or drink anything for at least 8 hours prior to your procedure. (Exception: Blood Pressure Medication. See below.) . Transportation: Unless otherwise stated by your physician, you may drive yourself after the procedure. . Blood Pressure Medicine: Do not forget to take your blood pressure medicine with a sip of water the morning of the procedure. If your Diastolic (lower reading)is above 100 mmHg, elective cases will be cancelled/rescheduled. . Blood thinners: These will need to be stopped for procedures. Notify our staff if you are taking any blood thinners. Depending on which one you take, there will be specific instructions on how and when to stop it. . Diabetics on insulin: Notify the staff so that you can be scheduled 1st case in the morning. If your diabetes requires high dose insulin, take only  of your normal insulin dose the morning of the procedure and notify the staff that you have done so. . Preventing infections: Shower with an antibacterial soap the morning of your procedure. . Build-up your immune system: Take 1000 mg of Vitamin  C with every meal (3 times a day) the day prior to your procedure. Marland Kitchen Antibiotics: Inform the staff if you have a condition or reason that requires you to take antibiotics before dental procedures. . Pregnancy: If you are pregnant, call and cancel the procedure. . Sickness: If you have a cold, fever, or any active infections, call and cancel the procedure. . Arrival: You must be in the facility at least 30 minutes prior to your scheduled procedure. . Children: Do not bring children with you. . Dress appropriately: Bring dark clothing that you would not mind if they get stained. . Valuables: Do not bring any jewelry or valuables.  Reasons to call and reschedule or cancel your procedure: (Following these recommendations will minimize the risk of a serious complication.) . Surgeries: Avoid having procedures within 2 weeks of any surgery. (Avoid for 2 weeks before or after any surgery). . Flu Shots: Avoid having procedures within 2 weeks of a flu shots or . (Avoid for 2 weeks before or after immunizations). . Barium: Avoid having a procedure within 7-10 days after having had a radiological study involving the use of radiological contrast. (Myelograms, Barium swallow or enema study). . Heart attacks: Avoid any elective procedures or surgeries for the initial 6 months after a "Myocardial Infarction" (Heart Attack). . Blood thinners: It is imperative that you stop these medications before procedures. Let us know if you if you take any blood thinner.  . Infection: Avoid procedures during or within two weeks of an infection (including chest colds or gastrointestinal problems). Symptoms associated with infections include: Localized redness, fever, chills, night sweats or profuse sweating, burning sensation when voiding, cough, congestion, stuffiness, runny nose, sore throat, diarrhea,  nausea, vomiting, cold or Flu symptoms, recent or current infections. It is specially important if the infection is over the area  that we intend to treat. Marland Kitchen Heart and lung problems: Symptoms that may suggest an active cardiopulmonary problem include: cough, chest pain, breathing difficulties or shortness of breath, dizziness, ankle swelling, uncontrolled high or unusually low blood pressure, and/or palpitations. If you are experiencing any of these symptoms, cancel your procedure and contact your primary care physician for an evaluation.  Remember:  Regular Business hours are:  Monday to Thursday 8:00 AM to 4:00 PM  Provider's Schedule: Milinda Pointer, MD:  Procedure days: Tuesday and Thursday 7:30 AM to 4:00 PM  Gillis Santa, MD:  Procedure days: Monday and Wednesday 7:30 AM to 4:00 PM ____________________________________________________________________________________________  ____________________________________________________________________________________________  General Risks and Possible Complications  Patient Responsibilities: It is important that you read this as it is part of your informed consent. It is our duty to inform you of the risks and possible complications associated with treatments offered to you. It is your responsibility as a patient to read this and to ask questions about anything that is not clear or that you believe was not covered in this document.  Patient's Rights: You have the right to refuse treatment. You also have the right to change your mind, even after initially having agreed to have the treatment done. However, under this last option, if you wait until the last second to change your mind, you may be charged for the materials used up to that point.  Introduction: Medicine is not an Chief Strategy Officer. Everything in Medicine, including the lack of treatment(s), carries the potential for danger, harm, or loss (which is by definition: Risk). In Medicine, a complication is a secondary problem, condition, or disease that can aggravate an already existing one. All treatments carry the risk  of possible complications. The fact that a side effects or complications occurs, does not imply that the treatment was conducted incorrectly. It must be clearly understood that these can happen even when everything is done following the highest safety standards.  No treatment: You can choose not to proceed with the proposed treatment alternative. The "PRO(s)" would include: avoiding the risk of complications associated with the therapy. The "CON(s)" would include: not getting any of the treatment benefits. These benefits fall under one of three categories: diagnostic; therapeutic; and/or palliative. Diagnostic benefits include: getting information which can ultimately lead to improvement of the disease or symptom(s). Therapeutic benefits are those associated with the successful treatment of the disease. Finally, palliative benefits are those related to the decrease of the primary symptoms, without necessarily curing the condition (example: decreasing the pain from a flare-up of a chronic condition, such as incurable terminal cancer).  General Risks and Complications: These are associated to most interventional treatments. They can occur alone, or in combination. They fall under one of the following six (6) categories: no benefit or worsening of symptoms; bleeding; infection; nerve damage; allergic reactions; and/or death. 1. No benefits or worsening of symptoms: In Medicine there are no guarantees, only probabilities. No healthcare provider can ever guarantee that a medical treatment will work, they can only state the probability that it may. Furthermore, there is always the possibility that the condition may worsen, either directly, or indirectly, as a consequence of the treatment. 2. Bleeding: This is more common if the patient is taking a blood thinner, either prescription or over the counter (example: Goody Powders, Fish oil, Aspirin, Garlic, etc.), or if suffering a  condition associated with impaired  coagulation (example: Hemophilia, cirrhosis of the liver, low platelet counts, etc.). However, even if you do not have one on these, it can still happen. If you have any of these conditions, or take one of these drugs, make sure to notify your treating physician. 3. Infection: This is more common in patients with a compromised immune system, either due to disease (example: diabetes, cancer, human immunodeficiency virus [HIV], etc.), or due to medications or treatments (example: therapies used to treat cancer and rheumatological diseases). However, even if you do not have one on these, it can still happen. If you have any of these conditions, or take one of these drugs, make sure to notify your treating physician. 4. Nerve Damage: This is more common when the treatment is an invasive one, but it can also happen with the use of medications, such as those used in the treatment of cancer. The damage can occur to small secondary nerves, or to large primary ones, such as those in the spinal cord and brain. This damage may be temporary or permanent and it may lead to impairments that can range from temporary numbness to permanent paralysis and/or brain death. 5. Allergic Reactions: Any time a substance or material comes in contact with our body, there is the possibility of an allergic reaction. These can range from a mild skin rash (contact dermatitis) to a severe systemic reaction (anaphylactic reaction), which can result in death. 6. Death: In general, any medical intervention can result in death, most of the time due to an unforeseen complication. ____________________________________________________________________________________________

## 2019-10-06 NOTE — Progress Notes (Signed)
PROVIDER NOTE: Information contained herein reflects review and annotations entered in association with encounter. Interpretation of such information and data should be left to medically-trained personnel. Information provided to patient can be located elsewhere in the medical record under "Patient Instructions". Document created using STT-dictation technology, any transcriptional errors that may result from process are unintentional.    Patient: Marie Clarke  Service Category: E/M  Provider: Oswaldo Done, MD  DOB: [birth year]  DOS: 10/06/2019  Specialty: Interventional Pain Management  MRN: [id number]  Setting: Ambulatory outpatient  PCP: Rayetta Humphrey, MD  Type: Established Patient    Referring Provider: Rayetta Humphrey, MD  Location: Office  Delivery: Face-to-face     HPI  Reason for encounter: Marie Clarke, a 49 y.o. year old female, is here today for evaluation and management of her Chronic pain syndrome [G89.4]. Marie Clarke primary complain today is Foot Pain (left) Last encounter: Practice (09/28/2019). My last encounter with her was on 09/21/2019. Pertinent problems: Marie Clarke has Algodystrophic syndrome; Abnormal gait; Chronic low back pain (Primary Area of Pain) (Bilateral) (L>R); Chronic lower extremity pain (Referred) (Secondary Area of Pain) (Left); Abnormal CT scan, lumbar spine; Lumbar facet hypertrophy; Lumbar facet syndrome (Bilateral) (L>R); Lumbar foraminal stenosis (Bilateral) (L5-S1); Chronic ankle pain [facility] Area of Pain) (Left); Neurogenic pain; Neuropathic pain; Myofascial pain; Contusion of knee (Left); Strain of knee (Left); Osteopenia; Strain of extensor muscle, fascia and tendon of left index finger at wrist and hand level, initial encounter; Chronic neck pain (Fourth Area of Pain) (Bilateral) (L>R); Chronic pain syndrome; Spondylosis without myelopathy or radiculopathy, lumbosacral region; Chronic musculoskeletal pain; Chronic shoulder pain (Fifth Area of Pain)  (Bilateral) (L>R); CRPS (complex regional pain syndrome) type 1 of lower limb (left ankle); Ankle joint instability (Left); Ankle sprain, sequela (Left); Disease related peripheral neuropathy; Occipital headache (Bilateral); Cervicogenic headache (Bilateral) (L>R); DDD (degenerative disc disease), lumbosacral; Osteoarthritis involving multiple joints; Chronic lumbosacral L5-S1 IVD protrusion (Bilateral); Lumbar lateral recess stenosis (L5-S1) (Bilateral); Gout; Chronic migraine; Lumbosacral radiculopathy at L5 (Right); and Pain due to any device, implant or graft on their pertinent problem list. Pain Assessment: Severity of Chronic pain is reported as a 9 /10. Location: Foot Left/denies. Onset: More than a month ago. Quality:  . Timing: Constant. Modifying factor(s): nothing. Vitals:  height is 5\' 4"  (1.626 m) and weight is 230 lb (104.3 kg). Her temporal temperature is 97.3 F (36.3 C) (abnormal). Her blood pressure is 115/76 and her pulse is 103 (abnormal). Her respiration is 16 and oxygen saturation is 100%.   Today I had a long conversation with the patient regarding her spinal cord stimulator.  Interestingly, she apparently was not aware that she could increase or decrease the output on her spinal cord stimulator at will.  She knew that she could do that with her programmer, but she was under the impression that she needed permission to do that.  Today I have clarified this for her and she is now well aware that she does not need to ask permission to go up or down on her spinal cord stimulator as needed.  Today she made some adjustments in the clinic and she indicated that this seems to have helped.  In addition to this the patient indicates having lost approximately 11 pounds and this goes a long way towards improving her back pain.  Because she is still having some pain in her lower extremity secondary to her CRPS, we have schedule her to come in for a diagnostic lumbar sympathetic  block under  fluoroscopic guidance and IV sedation.  In addition, the plan is to have her come in at the same time that we have a [employer] representative available so that they can help program and adjust her spinal cord stimulator.  She understood and accepted this plan and she was in agreement with it.  Pharmacotherapy Assessment   Analgesic: No opioid analgesics prescribed by our practice.I have discontinue all opioid analgesics on 09/09/2019 secondary to the patient's disregard towards our medication rules and regulations.  High-Risk due to history of a prior suicidal attempt w/ medications.  PMP revealed obtaining undisclosed opioid analgesics from several providers. MME/day:0mg /day.   Monitoring: [place] PMP: PDMP reviewed during this encounter.       Pharmacotherapy: No side-effects or adverse reactions reported. Compliance: No problems identified. Effectiveness: Clinically acceptable.  Landis Martins, RN  10/06/2019 12:30 PM  Sign when Signing Visit Nursing Pain Medication Assessment:  Safety precautions to be maintained throughout the outpatient stay will include: orient to surroundings, keep bed in low position, maintain call bell within reach at all times, provide assistance with transfer out of bed and ambulation.  Medication Inspection Compliance: Marie Clarke did not comply with our request to bring her pills to be counted. She was reminded that bringing the medication bottles, even when empty, is a requirement.  Medication: None brought in. Pill/Patch Count: None available to be counted. Bottle Appearance: No container available. Did not bring bottle(s) to appointment. Filled Date: N/A Last Medication intake:  Today    UDS:  Summary  Date Value Ref Range Status  07/02/2019 Note  Final    Comment:    ==================================================================== [employer] 13 (MW) ==================================================================== Specimen Alert Note: Urinary  creatinine is very low; ability to detect some drugs may be compromised; creatinine-normalized drug concentrations should be interpreted with caution. Suggest recollection. (Creatinine) ==================================================================== Test                             Result       Flag       Units Drug Present and Declared for Prescription Verification   Tramadol                       >62500       EXPECTED   ng/mg creat   O-Desmethyltramadol            22813        EXPECTED   ng/mg creat   N-Desmethyltramadol            11550        EXPECTED   ng/mg creat    Source of tramadol is a prescription medication. O-desmethyltramadol    and N-desmethyltramadol are expected metabolites of tramadol. ==================================================================== Test                      Result    Flag   Units      Ref Range   Creatinine              8         LL     mg/dL      >=20 ==================================================================== Declared Medications:  The flagging and interpretation on this report are based on the  following declared medications.  Unexpected results may arise from  inaccuracies in the declared medications.  **Note: The testing scope of this panel includes these medications:  Tramadol  **  Note: The testing scope of this panel does not include the  following reported medications:  Albuterol  Aspirin  Atorvastatin  Atropine (Lomotil)  Buspirone  Carvedilol (Coreg)  Celecoxib (Celebrex)  Colchicine  Cysteamine (Procysbi)  Darifenacin  Dicyclomine (Bentyl)  Diphenoxylate (Lomotil)  Dulaglutide  Eye Drops  Famotidine (Pepcid)  Fluticasone  Fluticasone (Trelegy)  Gabapentin  Hydrochlorothiazide  Hydroxyzine  Levocetirizine (Xyzal)  Levothyroxine  Magnesium (Mag-Ox)  Methocarbamol  Metoprolol  Montelukast (Singulair)  Naproxen  Nitroglycerin  Omeprazole  Ondansetron  Potassium  Prednisone  Pregabalin  Prochlorperazine  (Compazine)  Promethazine  Quetiapine (Seroquel)  Rizatriptan (Maxalt)  Ropinirole (Requip)  Solifenacin (Vesicare)  Topiramate (Topamax)  Torsemide  Triamterene  Umeclidinium (Trelegy)  Vilanterol (Trelegy)  Vitamin B  Vitamin C  Vitamin D3  Vitamin E ==================================================================== For clinical consultation, please call [phone]. ====================================================================      ROS  Constitutional: Denies any fever or chills Gastrointestinal: No reported hemesis, hematochezia, vomiting, or acute GI distress Musculoskeletal: Denies any acute onset joint swelling, redness, loss of ROM, or weakness Neurological: No reported episodes of acute onset apraxia, aphasia, dysarthria, agnosia, amnesia, paralysis, loss of coordination, or loss of consciousness  Medication Review  B-complex with vitamin C, Dexcom G6 Receiver, Dexcom G6 Sensor, Dexcom G6 Transmitter, Dulaglutide, Fluticasone-Umeclidin-Vilant, Olopatadine HCl, QUEtiapine, Vitamin D3, accu-chek safe-t pro, albuterol, atorvastatin, benztropine, busPIRone, colchicine, darifenacin, desvenlafaxine, dicyclomine, diphenoxylate-atropine, famotidine, fluticasone, gabapentin, glucose blood, hydrOXYzine, levocetirizine, levothyroxine, meloxicam, methocarbamol, metoprolol succinate, montelukast, nitroGLYCERIN, potassium chloride SA, rizatriptan, topiramate, torsemide, and vitamin E  History Review  Allergy: Marie Clarke is allergic to diazepam, ziprasidone hcl, azithromycin, divalproex sodium, levofloxacin, lisinopril, metronidazole, sulfa antibiotics, valproic acid, cephalexin, ciprofloxacin, and penicillins. Drug: Marie Clarke  reports no history of drug use. Alcohol:  reports no history of alcohol use. Tobacco:  reports that she quit smoking about 13 months ago. Her smoking use included cigarettes. She smoked 0.50 packs per day. She has never used smokeless tobacco. Social: Ms.  Clarke  reports that she quit smoking about 13 months ago. Her smoking use included cigarettes. She smoked 0.50 packs per day. She has never used smokeless tobacco. She reports that she does not drink alcohol and does not use drugs. Medical:  has a past medical history of Anemia, Anginal pain (HCC), Anxiety, Arthritis, Asthma, Bilateral lower extremity edema, Bipolar disorder (HCC), CAD (coronary artery disease) (unk), CHF (congestive heart failure) (HCC), Chronic pain syndrome, COPD (chronic obstructive pulmonary disease) (HCC), DDD (degenerative disc disease), lumbar, Depression (unk), Diabetes mellitus without complication (HCC), Diabetes mellitus, type II (HCC), Drug overdose (2015), Dysrhythmia, Fibromyalgia, GERD (gastroesophageal reflux disease), Headache, Hyperlipidemia, Hypertension, Hypothyroidism, Left leg pain (04/29/2014), MI (myocardial infarction) (HCC) (2007), Muscle ache (09/16/2014), Osteoporosis, Overactive bladder, Pancreatitis (unk), Panic attacks, PTSD (post-traumatic stress disorder), Reflex sympathetic dystrophy, Renal cyst, left, Renal insufficiency, Restless legs syndrome, Sleep apnea (06/2019), Sleep apnea, Stroke (HCC) (2010), Suicide attempt (HCC), Thyroid disease, TIA (transient ischemic attack) (unk), and TIA (transient ischemic attack). Surgical: Ms. Girdler  has a past surgical history that includes prolapse rectum surgery (N/A, July 2016); Tonsillectomy; Abdominal hysterectomy; Hernia repair (07/15/2017); Cardiac catheterization; lumbar facet; and Thoracic laminectomy for spinal cord stimulator (N/A, 07/27/2019). Family: family history includes Anxiety disorder in her father and mother; Bipolar disorder in her father and mother; CAD in her mother; Depression in her father and mother; Diabetes Mellitus II in her mother; Hypertension in her father; Osteoarthritis in her mother; Osteoporosis in her mother; Post-traumatic stress disorder in her sister; Sleep apnea in her mother.  Laboratory  Chemistry Profile   Renal Lab Results  Component Value Date   BUN 20 09/12/2019   CREATININE 1.00 09/12/2019   BCR 16 09/09/2017   GFRAA >60 09/12/2019   GFRNONAA >60 09/12/2019     Hepatic Lab Results  Component Value Date   AST 25 09/06/2019   ALT 24 09/06/2019   ALBUMIN 4.6 09/06/2019   ALKPHOS 133 (H) 09/06/2019   LIPASE 34 09/06/2019     Electrolytes Lab Results  Component Value Date   NA 139 09/12/2019   K 3.2 (L) 09/12/2019   CL 103 09/12/2019   CALCIUM 9.0 09/12/2019   MG 1.9 09/09/2017     Bone Lab Results  Component Value Date   25OHVITD1 43 09/09/2017   25OHVITD2 <1.0 09/09/2017   25OHVITD3 43 09/09/2017     Inflammation (CRP: Acute Phase) (ESR: Chronic Phase) Lab Results  Component Value Date   CRP 21 (H) 09/09/2017   ESRSEDRATE 50 (H) 09/09/2017   LATICACIDVEN 1.6 01/11/2019       Note: Above Lab results reviewed.  Recent Imaging Review  US ABDOMEN LIMITED RUQ CLINICAL DATA:  49 year old female with history of right upper quadrant abdominal pain, nausea, vomiting and bloating for the past 2 days.  EXAM: ULTRASOUND ABDOMEN LIMITED RIGHT UPPER QUADRANT  COMPARISON:  No priors.  FINDINGS: Gallbladder:  No gallstones or wall thickening visualized. No sonographic Murphy sign noted by sonographer.  Common bile duct:  Diameter: 3.2 mm  Liver:  No focal lesion identified. Diffusely increased hepatic echogenicity, suggesting hepatic steatosis. Portal vein is patent on color Doppler imaging with normal direction of blood flow towards the liver.  Other: None.  IMPRESSION: 1. No acute findings. Specifically, no gallstones and no findings to suggest an acute cholecystitis at this time. 2. Hepatic steatosis.  Electronically Signed   By: Vinnie Langton M.D.   On: 09/06/2019 18:42 Note: Reviewed        Physical Exam  General appearance: Well nourished, well developed, and well hydrated. In no apparent acute distress Mental  status: Alert, oriented x 3 (person, place, & time)       Respiratory: No evidence of acute respiratory distress Eyes: PERLA Vitals: BP 115/76 (BP Location: Right Arm, Patient Position: Sitting, Cuff Size: Large)   Pulse (!) 103   Temp (!) 97.3 F (36.3 C) (Temporal)   Resp 16   Ht $R'5\' 4"'or$  (1.626 m)   Wt 230 lb (104.3 kg)   SpO2 100%   BMI 39.48 kg/m  BMI: Estimated body mass index is 39.48 kg/m as calculated from the following:   Height as of this encounter: $RemoveBeforeD'5\' 4"'gQOfOuyCTDYxwc$  (1.626 m).   Weight as of this encounter: 230 lb (104.3 kg). Ideal: Ideal body weight: 54.7 kg (120 lb 9.5 oz) Adjusted ideal body weight: 74.6 kg (164 lb 5.7 oz)  Assessment   Status Diagnosis  Persistent Persistent Persistent 1. Chronic pain syndrome   2. CRPS (complex regional pain syndrome) type 1 of lower limb (left ankle)   3. Chronic low back pain (Primary Area of Pain) (Bilateral) (L>R)   4. Chronic lower extremity pain (Referred) (Secondary Area of Pain) (Left)   5. Chronic ankle pain [facility] Area of Pain) (Left)   6. Ankle sprain, sequela (Left)   7. Ankle joint instability (Left)   8. Algodystrophic syndrome   9. Presence of neurostimulator (SCS) (June 2021)   10. Osteoarthritis involving multiple joints      Updated Problems: No problems updated.  Plan of Care  Problem-specific:  No problem-specific Assessment & Plan notes found for this encounter.  Marie Clarke has a current medication list which includes the following long-term medication(s): atorvastatin, colchicine, fluticasone, klor-con m20, levothyroxine, levothyroxine, methocarbamol, montelukast, nitroglycerin, quetiapine, benztropine, desvenlafaxine, rizatriptan, and torsemide.  Pharmacotherapy (Medications Ordered): Meds ordered this encounter  Medications  . meloxicam (MOBIC) 15 MG tablet    Sig: Take 1 tablet (15 mg total) by mouth daily.    Dispense:  30 tablet    Refill:  0    Do not add this medication to the electronic  "Automatic Refill" notification system. Patient may have prescription filled one day early if pharmacy is closed on scheduled refill date.   Orders:  Orders Placed This Encounter  Procedures  . Spinal Cord Stimulator Analysis    Standing Status:   Future    Standing Expiration Date:   12/06/2019    Scheduling Instructions:     Get [employer] Representative to come in to provide her with more programs for left leg stimulation.  . LUMBAR SYMPATHETIC BLOCK    For sympathetically-mediated lower extremity pain.    Standing Status:   Future    Standing Expiration Date:   11/05/2019    Scheduling Instructions:     Purpose: Diagnostic     Laterality: Left-sided     Level(s): Lumbar sympathetic chain (L3, L4)     Sedation: Sedation recommended.     Scheduling Timeframe: As permitted by the schedule    Order Specific Question:   Where will this procedure be performed?    Answer:   [facility]   Follow-up plan:   Return for Procedure (w/ sedation): (L) LSB #1 and to check Mobic trial..      Interventional management options:  Considering:   NOTE: The patient is medical psychology evaluation indicates that the patient is a "suitable candidate" for a spinal cord stimulator trial. Possible lumbar spinal cord stimulator implant(Trail done 04/22/2019.  She is currently pending implant.)  Diagnosticbilateral lumbar facet block Possible bilateral lumbar facetRFA Diagnostic left L5 TFESI Diagnostic left L5 SNRB Possible left L5 DRG RFA Diagnostic bilateral cervical facet block Possible bilateral cervical facetRFA Diagnosticleft IA shoulder injection Diagnosticleft suprascapular NB Diagnostic left suprascapularRFA Diagnostic left lumbar sympathetic block (LSB) #1  Possible left lumbar sympathetic RFA   Palliative PRN treatment(s):   Therapeutic/palliative left L5 TFESI #2 + right L4-5 LESI #2 (100/100/50/100) Palliative bilateral lumbar facet blocks Palliative right  lumbar facet RFA #2 (last done 04/22/2018) Palliative left lumbar facet RFA #2 (last done 03/13/2018)      Recent Visits Date Type Provider Dept  10/06/19 Office Visit Milinda Pointer, MD [facility]  09/21/19 Office Visit Milinda Pointer, MD [facility]  09/09/19 Telemedicine Milinda Pointer, MD [facility]  Showing recent visits within past 90 days and meeting all other requirements Future Appointments Date Type Provider Dept  10/22/19 Appointment Milinda Pointer, MD [facility]  Showing future appointments within next 90 days and meeting all other requirements  I discussed the assessment and treatment plan with the patient. The patient was provided an opportunity to ask questions and all were answered. The patient agreed with the plan and demonstrated an understanding of the instructions.  Patient advised to call back or seek an in-person evaluation if the symptoms or condition worsens.  Duration of encounter: 30 minutes.  Note by: Gaspar Cola, MD Date: 10/06/2019; Time: 12:13 PM

## 2019-10-14 ENCOUNTER — Telehealth (INDEPENDENT_AMBULATORY_CARE_PROVIDER_SITE_OTHER): Payer: Medicare HMO | Admitting: Psychiatry

## 2019-10-14 ENCOUNTER — Encounter: Payer: Self-pay | Admitting: Psychiatry

## 2019-10-14 ENCOUNTER — Other Ambulatory Visit: Payer: Self-pay

## 2019-10-14 DIAGNOSIS — F410 Panic disorder [episodic paroxysmal anxiety] without agoraphobia: Secondary | ICD-10-CM

## 2019-10-14 DIAGNOSIS — F3178 Bipolar disorder, in full remission, most recent episode mixed: Secondary | ICD-10-CM

## 2019-10-14 DIAGNOSIS — R259 Unspecified abnormal involuntary movements: Secondary | ICD-10-CM

## 2019-10-14 DIAGNOSIS — F4310 Post-traumatic stress disorder, unspecified: Secondary | ICD-10-CM

## 2019-10-14 MED ORDER — BUSPIRONE HCL 15 MG PO TABS: 15.0000 mg | ORAL_TABLET | Freq: Two times a day (BID) | ORAL | 0 refills | Status: DC

## 2019-10-14 MED ORDER — DESVENLAFAXINE SUCCINATE ER 50 MG PO TB24: 50.0000 mg | ORAL_TABLET | Freq: Every day | ORAL | 0 refills | Status: DC

## 2019-10-14 MED ORDER — BENZTROPINE MESYLATE 0.5 MG PO TABS: 0.5000 mg | ORAL_TABLET | Freq: Two times a day (BID) | ORAL | 1 refills | Status: DC

## 2019-10-14 NOTE — Progress Notes (Signed)
Provider Location : [facility] Patient Location : Car  Participants: Patient , Provider  Virtual Visit via Telephone Note  I connected with Marie Clarke on 10/14/19 at  2:00 PM EDT by telephone and verified that I am speaking with the correct person using two identifiers.   I discussed the limitations, risks, security and privacy concerns of performing an evaluation and management service by telephone and the availability of in person appointments. I also discussed with the patient that there may be a patient responsible charge related to this service. The patient expressed understanding and agreed to proceed.    I discussed the assessment and treatment plan with the patient. The patient was provided an opportunity to ask questions and all were answered. The patient agreed with the plan and demonstrated an understanding of the instructions.   The patient was advised to call back or seek an in-person evaluation if the symptoms worsen or if the condition fails to improve as anticipated.    [place] MD OP Progress Note  10/14/2019 2:29 PM Marie Clarke  MRN:  [id number]  Chief Complaint:  Chief Complaint    Follow-up     HPI: Marie Clarke is a 49 year old Caucasian female, single, lives in [place], has a history of bipolar disorder, PTSD, panic attacks, coronary artery disease, OSA on CPAP, COPD, hyperlipidemia, hypertension, hypothyroidism, back pain was evaluated by telemedicine today.  Patient today reports she believes her anxiety symptoms are under control.  She reports she had her neurology visit for abnormal movements of her fingers.  She reports she was told that it could be anxiety induced and her Seroquel was readjusted by neurologist.  She reports in spite of that she continues to have movements of her fingers which she describes as' playing piano'.  She reports it is not worsened by increasing the Seroquel.  She reports she would like to get help with her movements which she believes is not  anxiety induced.  Patient reports she currently follows up with her therapist on a monthly basis.  Patient denies any sleep problems.  She denies any suicidality, homicidality or perceptual disturbances.  Patient denies any other concerns today.    Visit Diagnosis:    ICD-10-CM   1. Bipolar disorder, in full remission, most recent episode mixed ([place])  F31.78 busPIRone (BUSPAR) 15 MG tablet  2. PTSD (post-traumatic stress disorder)  F43.10 busPIRone (BUSPAR) 15 MG tablet    desvenlafaxine (PRISTIQ) 50 MG 24 hr tablet  3. Panic attacks  F41.0 busPIRone (BUSPAR) 15 MG tablet    desvenlafaxine (PRISTIQ) 50 MG 24 hr tablet  4. Abnormal involuntary movement  R25.9 benztropine (COGENTIN) 0.5 MG tablet    Past Psychiatric History: I have reviewed past psychiatric history from my progress note on 03/06/2017.  Past Medical History:  Past Medical History:  Diagnosis Date  . Anemia   . Anginal pain ([employer])   . Anxiety   . Arthritis   . Asthma   . Bilateral lower extremity edema   . Bipolar disorder ([place])   . CAD (coronary artery disease) unk  . CHF (congestive heart failure) ([place])   . Chronic pain syndrome   . COPD (chronic obstructive pulmonary disease) ([facility])    emphysema  . DDD (degenerative disc disease), lumbar   . Depression unk  . Diabetes mellitus without complication ([place])   . Diabetes mellitus, type II ([employer])   . Drug overdose May 24, 2013   after dad died  . Dysrhythmia    history of tachy arrythmias  .  Fibromyalgia   . GERD (gastroesophageal reflux disease)   . Headache    chronic migraines  . Hyperlipidemia   . Hypertension   . Hypothyroidism   . Left leg pain 04/29/2014  . MI (myocardial infarction) ([place]) 2007  . Muscle ache 09/16/2014  . Osteoporosis   . Overactive bladder   . Pancreatitis unk  . Panic attacks   . PTSD (post-traumatic stress disorder)   . Reflex sympathetic dystrophy   . Renal cyst, left   . Renal insufficiency    ckd stage iii  . Restless legs  syndrome   . Sleep apnea 06/2019   uses cpap  . Sleep apnea   . Stroke ([place]) 2010   TIA x 2. no residual deficits  . Suicide attempt [facility])    after father died.  . Thyroid disease    thyroid nodule  . TIA (transient ischemic attack) unk  . TIA (transient ischemic attack)     Past Surgical History:  Procedure Laterality Date  . ABDOMINAL HYSTERECTOMY    . CARDIAC CATHETERIZATION    . HERNIA REPAIR  07/15/2017   [facility]  . lumbar facet     several  . prolapse rectum surgery N/A July 2016  . THORACIC LAMINECTOMY FOR SPINAL CORD STIMULATOR N/A 07/27/2019   Procedure: THORACIC SPINAL CORD STIMULATOR PLACEMENT WITH RIGHT FLANK PULSE GENERATOR;  Surgeon: [name], MD;  Location: [facility] ORS;  Service: Neurosurgery;  Laterality: N/A;  . TONSILLECTOMY      Family Psychiatric History: I have reviewed family psychiatric history from my progress note on 03/06/2017  Family History:  Family History  Problem Relation Age of Onset  . Diabetes Mellitus II Mother   . CAD Mother   . Sleep apnea Mother   . Osteoarthritis Mother   . Osteoporosis Mother   . Anxiety disorder Mother   . Depression Mother   . Bipolar disorder Mother   . Bipolar disorder Father   . Hypertension Father   . Depression Father   . Anxiety disorder Father   . Post-traumatic stress disorder Sister     Social History: Reviewed social history from my progress note on 03/06/2017 Social History   Socioeconomic History  . Marital status: Single    Spouse name: Not on file  . Number of children: 0  . Years of education: Not on file  . Highest education level: High school graduate  Occupational History    Comment: not employed  Tobacco Use  . Smoking status: Former Smoker    Packs/day: 0.50    Types: Cigarettes    Quit date: 09/01/2018    Years since quitting: 1.1  . Smokeless tobacco: Never Used  . Tobacco comment: Patient quit smoking 09/01/2018  Vaping Use  . Vaping Use: Never used  Substance and Sexual  Activity  . Alcohol use: No    Alcohol/week: 0.0 standard drinks  . Drug use: No  . Sexual activity: Not Currently  Other Topics Concern  . Not on file  Social History Narrative   Patient lives with aunt and uncle.   Social Determinants of Health   Financial Resource Strain:   . Difficulty of Paying Living Expenses: Not on file  Food Insecurity:   . Worried About Charity fundraiser in the Last Year: Not on file  . Ran Out of Food in the Last Year: Not on file  Transportation Needs:   . Lack of Transportation (Medical): Not on file  . Lack of Transportation (Non-Medical): Not  on file  Physical Activity:   . Days of Exercise per Week: Not on file  . Minutes of Exercise per Session: Not on file  Stress:   . Feeling of Stress : Not on file  Social Connections:   . Frequency of Communication with Friends and Family: Not on file  . Frequency of Social Gatherings with Friends and Family: Not on file  . Attends Religious Services: Not on file  . Active Member of Clubs or Organizations: Not on file  . Attends Archivist Meetings: Not on file  . Marital Status: Not on file    Allergies:  Allergies  Allergen Reactions  . Diazepam Hives and Nausea And Vomiting  . Ziprasidone Hcl Other (See Comments) and Itching    CONFUSED CONFUSED Other reaction(s): Other (See Comments) CONFUSED CONFUSED   . Azithromycin Hives  . Divalproex Sodium Itching  . Levofloxacin Hives and Rash    1/31: would like to retry since not taking valium  . Lisinopril     Medicine cause kidney failure  . Metronidazole Hives    1/31: would like to retry since she is no longer taking valium  . Sulfa Antibiotics Hives  . Valproic Acid Itching and Rash  . Cephalexin Hives  . Ciprofloxacin Hives  . Penicillins Nausea And Vomiting    Has patient had a PCN reaction causing immediate rash, facial/tongue/throat swelling, SOB or lightheadedness with hypotension: No Has patient had a PCN reaction  causing severe rash involving mucus membranes or skin necrosis: No Has patient had a PCN reaction that required hospitalization: No Has patient had a PCN reaction occurring within the last 10 years: No If all of the above answers are "NO", then may proceed with Cephalosporin use.     Metabolic Disorder Labs: Lab Results  Component Value Date   HGBA1C 6.3 (H) 09/29/2018   MPG 134.11 09/29/2018   No results found for: PROLACTIN No results found for: CHOL, TRIG, HDL, CHOLHDL, VLDL, LDLCALC Lab Results  Component Value Date   TSH 2.296 11/17/2018   TSH 3.335 09/29/2018    Therapeutic Level Labs: No results found for: LITHIUM Lab Results  Component Value Date   VALPROATE 33 (L) 09/29/2018   No components found for:  CBMZ  Current Medications: Current Outpatient Medications  Medication Sig Dispense Refill  . atorvastatin (LIPITOR) 40 MG tablet Take 40 mg by mouth at bedtime.     . B Complex-C (B-COMPLEX WITH VITAMIN C) tablet Take by mouth.    . busPIRone (BUSPAR) 15 MG tablet Take 1 tablet (15 mg total) by mouth 2 (two) times daily. 180 tablet 0  . Cholecalciferol (VITAMIN D3) 125 MCG (5000 UT) TABS Take by mouth.    . colchicine (COLCRYS) 0.6 MG tablet as needed. For gout flare up    . Continuous Blood Gluc Receiver (DEXCOM G6 RECEIVER) DEVI 1 device use as directed annually    . Continuous Blood Gluc Sensor (DEXCOM G6 SENSOR) MISC Use 1 each every 30 (thirty) days    . Continuous Blood Gluc Transmit (DEXCOM G6 TRANSMITTER) MISC Use 1 Device every 3 (three) months    . darifenacin (ENABLEX) 7.5 MG 24 hr tablet Take 7.5 mg by mouth at bedtime. Use as needed for OAB    . desvenlafaxine (PRISTIQ) 50 MG 24 hr tablet Take 1 tablet (50 mg total) by mouth at bedtime. 90 tablet 0  . dicyclomine (BENTYL) 10 MG capsule 3 (three) times daily.     . Dulaglutide 0.75  MG/0.5ML SOPN Inject 0.75 mg into the skin every Saturday.     . famotidine (PEPCID) 20 MG tablet Take 40 mg by mouth daily.      . fluticasone (FLONASE) 50 MCG/ACT nasal spray Place into the nose.    . gabapentin (NEURONTIN) 600 MG tablet Take by mouth 3 (three) times daily.     Marland Kitchen glucose blood test strip OneTouch Verio test strips    . hydrOXYzine (ATARAX/VISTARIL) 25 MG tablet Take 1 tablet (25 mg total) by mouth every 6 (six) hours as needed. (Patient taking differently: Take 25 mg by mouth every 6 (six) hours as needed for itching. Primarily for itching) 360 tablet 0  . KLOR-CON M20 20 MEQ tablet Take 0.5 tablets (10 mEq total) by mouth 2 (two) times daily. (Patient taking differently: Take 20 mEq by mouth 2 (two) times daily. ) 30 tablet 0  . Lancets (ACCU-CHEK SAFE-T PRO) lancets     . levocetirizine (XYZAL) 5 MG tablet Take 5 mg by mouth daily. Takes at night    . levothyroxine (SYNTHROID) 50 MCG tablet Take 25 mcg by mouth daily. (256mcg total daily)    . levothyroxine (SYNTHROID, LEVOTHROID) 200 MCG tablet Take 200 mcg by mouth daily. (252mcg total daily)    . meloxicam (MOBIC) 15 MG tablet Take 1 tablet (15 mg total) by mouth daily. 30 tablet 0  . methocarbamol (ROBAXIN) 750 MG tablet Take 1 tablet (750 mg total) by mouth every 8 (eight) hours as needed for muscle spasms. 90 tablet 5  . metoprolol succinate (TOPROL-XL) 25 MG 24 hr tablet Take 25 mg by mouth daily. Takes in the evening    . montelukast (SINGULAIR) 10 MG tablet Take 10 mg by mouth daily. Takes in the afternoon    . nitroGLYCERIN (NITROSTAT) 0.4 MG SL tablet Place 0.4 mg under the tongue every 5 (five) minutes as needed for chest pain.     Marland Kitchen QUEtiapine (SEROQUEL) 100 MG tablet Take 1 tablet (100 mg total) by mouth at bedtime. Dose reduction (Patient taking differently: Take by mouth at bedtime. 125 mg at bedtime, 25 mg in a.m.) 90 tablet 0  . topiramate (TOPAMAX) 50 MG tablet Take 50 mg by mouth 2 (two) times daily.    . TRELEGY ELLIPTA 100-62.5-25 MCG/INH AEPB Inhale 1 puff into the lungs daily. Takes at night    . VENTOLIN HFA 108 (90 Base)  MCG/ACT inhaler Inhale 1-2 puffs into the lungs every 6 (six) hours as needed for wheezing or shortness of breath.     . vitamin E 400 UNIT capsule 400 Units daily.     . benztropine (COGENTIN) 0.5 MG tablet Take 1 tablet (0.5 mg total) by mouth 2 (two) times daily. Abnormal movements 60 tablet 1  . diphenoxylate-atropine (LOMOTIL) 2.5-0.025 MG tablet Take by mouth. (Patient not taking: Reported on 10/14/2019)    . Olopatadine HCl 0.2 % SOLN  (Patient not taking: Reported on 10/14/2019)    . rizatriptan (MAXALT) 10 MG tablet Take 1 tablet (10 mg total) by mouth as needed. 10 tablet 5  . torsemide (DEMADEX) 20 MG tablet Take 1 tablet (20 mg total) by mouth 2 (two) times daily. 60 tablet 0   No current facility-administered medications for this visit.     Musculoskeletal: Strength & Muscle Tone: UTA Gait & Station: UTA Patient leans: N/A  Psychiatric Specialty Exam: Review of Systems  Neurological:       Reports abnormal movements of her fingers - " playing piano" type  movements.  Psychiatric/Behavioral: Negative for agitation, confusion, decreased concentration, dysphoric mood, hallucinations, self-injury, sleep disturbance and suicidal ideas. The patient is not nervous/anxious and is not hyperactive.   All other systems reviewed and are negative.   There were no vitals taken for this visit.There is no height or weight on file to calculate BMI.  General Appearance: UTA  Eye Contact:  UTA  Speech:  Clear and Coherent  Volume:  Normal  Mood:  Anxious  Affect:  UTA  Thought Process:  Goal Directed and Descriptions of Associations: Intact  Orientation:  Full (Time, Place, and Person)  Thought Content: Logical   Suicidal Thoughts:  No  Homicidal Thoughts:  No  Memory:  Immediate;   Fair Recent;   Fair Remote;   Fair  Judgement:  Fair  Insight:  Fair  Psychomotor Activity:  UTA  Concentration:  Concentration: Fair and Attention Span: Fair  Recall:  [employer] of Knowledge: Fair   Language: Fair  Akathisia:  No  Handed:  Right  AIMS (if indicated): UTA  Assets:  Communication Skills Desire for Improvement Housing Social Support  ADL's:  Intact  Cognition: WNL  Sleep:  Fair   Screenings: PHQ2-9     Procedure visit from 04/22/2018 in [place] Procedure visit from 03/13/2018 in [place] Office Visit from 12/23/2017 in [place] Procedure visit from 12/05/2017 in [place] Office Visit from 11/13/2017 in [facility]  PHQ-2 Total Score 0 0 0 0 0       Assessment and Plan: Marie Clarke is a 49 year old Caucasian female who has a history of bipolar disorder, panic attacks, multiple medical problems including chronic back pain, was evaluated by telemedicine today.  Patient is currently struggling with abnormal involuntary movements of her fingers.  She however reports mood wise she is doing okay.  Plan as noted below.  Plan Bipolar disorder in remission Seroquel 125 mg p.o. nightly, Seroquel 25 mg every morning.  Recently increased by neurology. BuSpar 15 mg p.o. twice daily Pristiq 50 mg p.o. nightly  PTSD-stable Continue CBT with Mr. Vonna Kotyk sheets Pristiq and BuSpar as prescribed  Panic attacks-stable Hydroxyzine 25 mg every 6 hourly as needed for anxiety symptoms  Abnormal involuntary movements-unstable I have reviewed medical records-neurology consultation-dated 09/10/2019. Her Seroquel was recently increased by neurology. Patient would like to try Cogentin since she believes her symptoms are not anxiety induced and that her anxiety symptoms are under control. We will give Cogentin a trial.  Cogentin 0.5 mg p.o. twice daily.  Discussed with patient to stop the medication if she has any worsening symptoms.  Per review of neurology notes  Dr.Potter -abnormal involuntary movements/stress spells-anxiety-patient with abnormal involuntary movement-she continuously taps her fingers, shaking her legs and humming when she eats.  Symptoms not consistent with tremors.  Symptoms likely consistent with anxiety induced abnormal movements.  Increase Seroquel to 150 mg p.o. nightly and 25 mg in the morning. Decrease Requip to 1 mg p.o. twice daily for 1 week, then decrease to 1 mg once a day for a week and stop taking it.  We can consider a benzodiazepine if patient is off of opioids for at least 1 month.     Follow-up in clinic in 4 weeks or sooner if needed.  I have spent atleast 20 minutes non- face to face with patient today. More than  50 % of the time was spent for preparing to see the patient ( e.g., review of test, records ), obtaining and to review and separately obtained history , ordering medications and test ,psychoeducation and supportive psychotherapy and care coordination,as well as documenting clinical information in electronic health record. This note was generated in part or whole with voice recognition software. Voice recognition is usually quite accurate but there are transcription errors that can and very often do occur. I apologize for any typographical errors that were not detected and corrected.      Ursula Alert, MD 10/14/2019, 2:29 PM

## 2019-10-22 ENCOUNTER — Ambulatory Visit
Admission: RE | Admit: 2019-10-22 | Discharge: 2019-10-22 | Disposition: A | Discharge status: Home / Self Care | Payer: Medicare HMO | Source: Ambulatory Visit | Attending: Pain Medicine | Admitting: Pain Medicine

## 2019-10-22 ENCOUNTER — Other Ambulatory Visit: Payer: Self-pay

## 2019-10-22 ENCOUNTER — Ambulatory Visit (HOSPITAL_BASED_OUTPATIENT_CLINIC_OR_DEPARTMENT_OTHER): Payer: Medicare HMO | Admitting: Pain Medicine

## 2019-10-22 ENCOUNTER — Encounter: Payer: Self-pay | Admitting: Pain Medicine

## 2019-10-22 VITALS — BP 118/59 | HR 102 | Temp 97.8°F | Resp 14 | Ht 64.0 in | Wt 235.0 lb

## 2019-10-22 DIAGNOSIS — G90522 Complex regional pain syndrome I of left lower limb: Secondary | ICD-10-CM | POA: Insufficient documentation

## 2019-10-22 DIAGNOSIS — S93402S Sprain of unspecified ligament of left ankle, sequela: Secondary | ICD-10-CM

## 2019-10-22 DIAGNOSIS — M25372 Other instability, left ankle: Secondary | ICD-10-CM

## 2019-10-22 DIAGNOSIS — Z9682 Presence of neurostimulator: Secondary | ICD-10-CM

## 2019-10-22 DIAGNOSIS — M79605 Pain in left leg: Secondary | ICD-10-CM | POA: Insufficient documentation

## 2019-10-22 DIAGNOSIS — M25572 Pain in left ankle and joints of left foot: Secondary | ICD-10-CM | POA: Insufficient documentation

## 2019-10-22 DIAGNOSIS — M8900 Algoneurodystrophy, unspecified site: Secondary | ICD-10-CM

## 2019-10-22 DIAGNOSIS — G8929 Other chronic pain: Secondary | ICD-10-CM | POA: Insufficient documentation

## 2019-10-22 MED ORDER — MIDAZOLAM HCL 5 MG/5ML IJ SOLN
1.0000 mg | INTRAMUSCULAR | Status: DC | PRN
  Administered 2019-10-22: 3 mg via INTRAVENOUS
  Filled 2019-10-22: qty 5

## 2019-10-22 MED ORDER — DEXAMETHASONE SODIUM PHOSPHATE 10 MG/ML IJ SOLN
10.0000 mg | Freq: Once | INTRAMUSCULAR | Status: AC
  Administered 2019-10-22: 10 mg
  Filled 2019-10-22: qty 1

## 2019-10-22 MED ORDER — BUPIVACAINE-EPINEPHRINE (PF) 0.25% -1:200000 IJ SOLN
10.0000 mL | Freq: Once | INTRAMUSCULAR | Status: AC
  Administered 2019-10-22: 9 mL
  Filled 2019-10-22: qty 30

## 2019-10-22 MED ORDER — LIDOCAINE HCL 2 % IJ SOLN
20.0000 mL | Freq: Once | INTRAMUSCULAR | Status: AC
  Administered 2019-10-22: 400 mg

## 2019-10-22 MED ORDER — FENTANYL CITRATE (PF) 100 MCG/2ML IJ SOLN
25.0000 ug | INTRAMUSCULAR | Status: DC | PRN
  Administered 2019-10-22: 50 ug via INTRAVENOUS
  Filled 2019-10-22: qty 2

## 2019-10-22 MED ORDER — IOHEXOL 180 MG/ML  SOLN
10.0000 mL | Freq: Once | INTRAMUSCULAR | Status: AC
  Administered 2019-10-22: 10 mL via INTRATHECAL

## 2019-10-22 MED ORDER — LACTATED RINGERS IV SOLN
1000.0000 mL | Freq: Once | INTRAVENOUS | Status: AC
  Administered 2019-10-22: 1000 mL via INTRAVENOUS

## 2019-10-22 NOTE — Progress Notes (Signed)
PROVIDER NOTE: Information contained herein reflects review and annotations entered in association with encounter. Interpretation of such information and data should be left to medically-trained personnel. Information provided to patient can be located elsewhere in the medical record under "Patient Instructions". Document created using STT-dictation technology, any transcriptional errors that may result from process are unintentional.    Patient: Marie Clarke  Service Category: Procedure  Provider: Gaspar Cola, MD  DOB: August 22, 1970  DOS: 10/22/2019  Location: [facility]  MRN: [id number]  Setting: Ambulatory - outpatient  Referring Provider: Milinda Pointer, MD  Type: Established Patient  Specialty: Interventional Pain Management  PCP: Sharyne Peach, MD   Primary Reason for Visit: Interventional Pain Management Treatment. CC: Foot Pain (left)  Procedure:          Anesthesia, Analgesia, Anxiolysis:  Type: Diagnostic Lumbar Sympathetic Block #1  Region:Lumbosacral Level: L3, L4 Laterality: Left Paravertebral  Type: Moderate (Conscious) Sedation combined with Local Anesthesia Indication(s): Analgesia and Anxiety Route: Intravenous (IV) IV Access: Secured Sedation: Meaningful verbal contact was maintained at all times during the procedure  Local Anesthetic: Lidocaine 1-2%  Position: Prone with head of the table was raised to facilitate breathing.   Indications: 1. CRPS (complex regional pain syndrome) type 1 of lower limb (left ankle)   2. Algodystrophic syndrome   3. Chronic lower extremity pain (Referred) (Secondary Area of Pain) (Left)   4. Chronic ankle pain [facility] Area of Pain) (Left)   5. Ankle sprain, sequela (Left)   6. Ankle joint instability (Left)    Pain Score: Pre-procedure: 8 /10 Post-procedure: 0-No pain/10   Pre-op Assessment:  Marie Clarke is a 49 y.o. (year old), female patient, seen today for interventional treatment. She  has a past surgical  history that includes prolapse rectum surgery (N/A, July 2016); Tonsillectomy; Abdominal hysterectomy; Hernia repair (07/15/2017); Cardiac catheterization; lumbar facet; and Thoracic laminectomy for spinal cord stimulator (N/A, 07/27/2019). Marie Clarke has a current medication list which includes the following prescription(s): atorvastatin, b-complex with vitamin c, benztropine, buspirone, vitamin d3, dexcom g6 receiver, dexcom g6 sensor, dexcom g6 transmitter, darifenacin, desvenlafaxine, dicyclomine, dulaglutide, famotidine, fluticasone, gabapentin, glucose blood, hydroxyzine, klor-con m20, accu-chek safe-t pro, levocetirizine, levothyroxine, levothyroxine, meloxicam, methocarbamol, metoprolol succinate, montelukast, nitroglycerin, quetiapine, topiramate, trelegy ellipta, ventolin hfa, vitamin e, colchicine, diphenoxylate-atropine, levothyroxine, olopatadine hcl, rizatriptan, and torsemide, and the following Facility-Administered Medications: fentanyl and midazolam. Her primarily concern today is the Foot Pain (left)  Initial Vital Signs:  Pulse/HCG Rate: (!) 102ECG Heart Rate: 99 Temp: 98 F (36.7 C) Resp: 18 BP: 107/80 SpO2: 96 %  BMI: Estimated body mass index is 40.34 kg/m as calculated from the following:   Height as of this encounter: 5\' 4"  (1.626 m).   Weight as of this encounter: 235 lb (106.6 kg).  Risk Assessment: Allergies: Reviewed. She is allergic to diazepam, ziprasidone hcl, azithromycin, divalproex sodium, levofloxacin, lisinopril, metronidazole, sulfa antibiotics, valproic acid, cephalexin, ciprofloxacin, and penicillins.  Allergy Precautions: None required Coagulopathies: Reviewed. None identified.  Blood-thinner therapy: None at this time Active Infection(s): Reviewed. None identified. Marie Clarke is afebrile  Site Confirmation: Marie Clarke was asked to confirm the procedure and laterality before marking the site Procedure checklist: Completed Consent: Before the procedure and  under the influence of no sedative(s), amnesic(s), or anxiolytics, the patient was informed of the treatment options, risks and possible complications. To fulfill our ethical and legal obligations, as recommended by the American Medical Association's Code of Ethics, I have informed the patient of my clinical  impression; the nature and purpose of the treatment or procedure; the risks, benefits, and possible complications of the intervention; the alternatives, including doing nothing; the risk(s) and benefit(s) of the alternative treatment(s) or procedure(s); and the risk(s) and benefit(s) of doing nothing. The patient was provided information about the general risks and possible complications associated with the procedure. These may include, but are not limited to: failure to achieve desired goals, infection, bleeding, organ or nerve damage, allergic reactions, paralysis, and death. In addition, the patient was informed of those risks and complications associated to Spine-related procedures, such as failure to decrease pain; infection (i.e.: Meningitis, epidural or intraspinal abscess); bleeding (i.e.: epidural hematoma, subarachnoid hemorrhage, or any other type of intraspinal or peri-dural bleeding); organ or nerve damage (i.e.: Any type of peripheral nerve, nerve root, or spinal cord injury) with subsequent damage to sensory, motor, and/or autonomic systems, resulting in permanent pain, numbness, and/or weakness of one or several areas of the body; allergic reactions; (i.e.: anaphylactic reaction); and/or death. Furthermore, the patient was informed of those risks and complications associated with the medications. These include, but are not limited to: allergic reactions (i.e.: anaphylactic or anaphylactoid reaction(s)); adrenal axis suppression; blood sugar elevation that in diabetics may result in ketoacidosis or comma; water retention that in patients with history of congestive heart failure may result in  shortness of breath, pulmonary edema, and decompensation with resultant heart failure; weight gain; swelling or edema; medication-induced neural toxicity; particulate matter embolism and blood vessel occlusion with resultant organ, and/or nervous system infarction; and/or aseptic necrosis of one or more joints. Finally, the patient was informed that Medicine is not an exact science; therefore, there is also the possibility of unforeseen or unpredictable risks and/or possible complications that may result in a catastrophic outcome. The patient indicated having understood very clearly. We have given the patient no guarantees and we have made no promises. Enough time was given to the patient to ask questions, all of which were answered to the patient's satisfaction. Marie Clarke has indicated that she wanted to continue with the procedure. Attestation: I, the ordering provider, attest that I have discussed with the patient the benefits, risks, side-effects, alternatives, likelihood of achieving goals, and potential problems during recovery for the procedure that I have provided informed consent. Date  Time: 10/22/2019  9:01 AM  Pre-Procedure Preparation:  Monitoring: As per clinic protocol. Respiration, ETCO2, SpO2, BP, heart rate and rhythm monitor placed and checked for adequate function Safety Precautions: Patient was assessed for positional comfort and pressure points before starting the procedure. Time-out: I initiated and conducted the "Time-out" before starting the procedure, as per protocol. The patient was asked to participate by confirming the accuracy of the "Time Out" information. Verification of the correct person, site, and procedure were performed and confirmed by me, the nursing staff, and the patient. "Time-out" conducted as per Joint Commission's Universal Protocol (UP.01.01.01). Time: 1010  Description of Procedure:          Target Area: For Lumbar Sympathetic Block(s), the target is the  anterolateral aspect of the L3 & L4 vertebral bodies, where the lumbar sympathetic chain resides. Approach: Paravertebral, ipsilateral approach. Area Prepped: Entire Posterior Thoracolumbar Region DuraPrep (Iodine Povacrylex [0.7% available iodine] and Isopropyl Alcohol, 74% w/w) Safety Precautions: Aspiration looking for blood return was conducted prior to all injections. At no point did we inject any substances, as a needle was being advanced. No attempts were made at seeking any paresthesias. Safe injection practices and needle disposal techniques used.  Medications properly checked for expiration dates. SDV (single dose vial) medications used. Description of the Procedure: Protocol guidelines were followed. The patient was placed in position over the procedure table. The target area was identified and the area prepped in the usual manner. Skin & deeper tissues infiltrated with local anesthetic. Appropriate amount of time allowed to pass for local anesthetics to take effect. The procedure needles were then advanced to the target area, the superior anterolateral border of the L3 vertebral body, under pulsed fluoroscopic guidance. Care was taken not to advance the tip of the needle past the anterior border of the vertebral body, on the lateral fluoroscopic view. Proper needle placement secured. Negative aspiration confirmed. Solution injected in intermittent fashion, asking for systemic symptoms every 0.5cc of injectate. The needles were then removed and the area cleansed, making sure to leave some of the prepping solution back to take advantage of its long term bactericidal properties. Vitals:   10/22/19 1020 10/22/19 1030 10/22/19 1040 10/22/19 1051  BP: 112/76 104/75 101/68 (!) 118/59  Pulse:      Resp: 18 (!) 23 16 14   Temp:  (!) 97.5 F (36.4 C)  97.8 F (36.6 C)  TempSrc:  Temporal  Temporal  SpO2: 98% 98% 97% 95%  Weight:      Height:        Start Time: 1010 hrs. End Time: 1020  hrs. Materials:  Needle(s) Type: Spinal Needle Gauge: 22G Length: 3.5-in Medication(s): Please see orders for medications and dosing details.  Imaging Guidance (Spinal):          Type of Imaging Technique: Fluoroscopy Guidance (Spinal) Indication(s): Assistance in needle guidance and placement for procedures requiring needle placement in or near specific anatomical locations not easily accessible without such assistance. Exposure Time: Please see nurses notes. Contrast: Before injecting any contrast, we confirmed that the patient did not have an allergy to iodine, shellfish, or radiological contrast. Once satisfactory needle placement was completed at the desired level, radiological contrast was injected. Contrast injected under live fluoroscopy. No contrast complications. See chart for type and volume of contrast used. Fluoroscopic Guidance: I was personally present during the use of fluoroscopy. "Tunnel Vision Technique" used to obtain the best possible view of the target area. Parallax error corrected before commencing the procedure. "Direction-depth-direction" technique used to introduce the needle under continuous pulsed fluoroscopy. Once target was reached, antero-posterior, oblique, and lateral fluoroscopic projection used confirm needle placement in all planes. Images permanently stored in EMR. Interpretation: I personally interpreted the imaging intraoperatively. Adequate needle placement confirmed in multiple planes. Appropriate spread of contrast into desired area was observed. No evidence of afferent or efferent intravascular uptake. No intrathecal or subarachnoid spread observed. Permanent images saved into the patient's record.  Antibiotic Prophylaxis:   Anti-infectives (From admission, onward)   None     Indication(s): None identified  Post-operative Assessment:  Post-procedure Vital Signs:  Pulse/HCG Rate: (!) 10296 Temp: 97.8 F (36.6 C) (left 89.2, right 89.1) Resp:  14 BP: (!) 118/59 SpO2: 95 %  EBL: None  Complications: No immediate post-treatment complications observed by team, or reported by patient.  Note: The patient tolerated the entire procedure well. A repeat set of vitals were taken after the procedure and the patient was kept under observation following institutional policy, for this type of procedure. Post-procedural neurological assessment was performed, showing return to baseline, prior to discharge. The patient was provided with post-procedure discharge instructions, including a section on how to identify potential problems. Should any problems  arise concerning this procedure, the patient was given instructions to immediately contact us, at any time, without hesitation. In any case, we plan to contact the patient by telephone for a follow-up status report regarding this interventional procedure.  Comments:  No additional relevant information.  Plan of Care  Orders:  Orders Placed This Encounter  Procedures  . LUMBAR SYMPATHETIC BLOCK    For sympathetically-mediated lower extremity pain.    Scheduling Instructions:     Purpose: Diagnostic     Laterality: Left-sided     Level(s): Lumbar sympathetic chain (L3, L4)     Sedation: Sedation recommended.     Scheduling Timeframe: Today    Order Specific Question:   Where will this procedure be performed?    Answer:   [facility]  . DG PAIN CLINIC C-ARM 1-60 MIN NO REPORT    Intraoperative interpretation by procedural physician at [place].    Standing Status:   Standing    Number of Occurrences:   1    Order Specific Question:   Reason for exam:    Answer:   Assistance in needle guidance and placement for procedures requiring needle placement in or near specific anatomical locations not easily accessible without such assistance.  . Informed Consent Details: Physician/Practitioner Attestation; Transcribe to consent form and obtain patient signature    Provider  Attestation: I, [place] Dossie Arbour, MD, (Pain Management Specialist), the physician/practitioner, attest that I have discussed with the patient the benefits, risks, side effects, alternatives, likelihood of achieving goals and potential problems during recovery for the procedure that I have provided informed consent.    Scheduling Instructions:     Procedure: Diagnostic left lumbar sympathetic block     Indication/Reason: Left lower extremity complex regional pain syndrome     Note: Always confirm laterality of pain with Ms. [place], before procedure.  . Provide equipment / supplies at bedside    Equipment required: Single use, disposable, "Block Tray" Needle type: Spinal Amount/quantity: 2 Size: Long (7-inch) Gauge: 22G    Standing Status:   Standing    Number of Occurrences:   1    Order Specific Question:   Specify    Answer:   Block Tray  . Check temperature    Place skin temperature probes in both lower extremities to monitor temperature changes from sympathetic blockade. Record baseline temperatures before block. Record final temperatures 10 to 15 minutes after block and notify physician of difference between the two.    Standing Status:   Standing    Number of Occurrences:   1   Chronic Opioid Analgesic:  No opioid analgesics prescribed by our practice.I have discontinue all opioid analgesics on 09/09/2019 secondary to the patient's disregard towards our medication rules and regulations.  High-Risk due to history of a prior suicidal attempt w/ medications.  PMP revealed obtaining undisclosed opioid analgesics from several providers. MME/day:0mg /day.   Medications ordered for procedure: Meds ordered this encounter  Medications  . iohexol (OMNIPAQUE) 180 MG/ML injection 10 mL    Must be Myelogram-compatible. If not available, you may substitute with a water-soluble, non-ionic, hypoallergenic, myelogram-compatible radiological contrast medium.  Marland Kitchen lidocaine (XYLOCAINE) 2 % (with pres)  injection 400 mg  . lactated ringers infusion 1,000 mL  . midazolam (VERSED) 5 MG/5ML injection 1-2 mg    Make sure Flumazenil is available in the pyxis when using this medication. If oversedation occurs, administer 0.2 mg IV over 15 sec. If after 45 sec no response, administer 0.2 mg  again over 1 min; may repeat at 1 min intervals; not to exceed 4 doses (1 mg)  . fentaNYL (SUBLIMAZE) injection 25-50 mcg    Make sure Narcan is available in the pyxis when using this medication. In the event of respiratory depression (RR< 8/min): Titrate NARCAN (naloxone) in increments of 0.1 to 0.2 mg IV at 2-3 minute intervals, until desired degree of reversal.  . dexamethasone (DECADRON) injection 10 mg  . bupivacaine-epinephrine (MARCAINE W/ EPI) 0.25% -1:200000 injection 10 mL   Medications administered: We administered iohexol, lidocaine, lactated ringers, midazolam, fentaNYL, dexamethasone, and bupivacaine-epinephrine.  See the medical record for exact dosing, route, and time of administration.  Follow-up plan:   Return in about 2 weeks (around 11/05/2019) for (VV), (PP) Follow-up.       Interventional management options:  Considering:   NOTE: The patient is medical psychology evaluation indicates that the patient is a "suitable candidate" for a spinal cord stimulator trial. Possible lumbar spinal cord stimulator implant(Trail done 04/22/2019.  She is currently pending implant.)  Diagnosticbilateral lumbar facet block Possible bilateral lumbar facetRFA Diagnostic left L5 TFESI Diagnostic left L5 SNRB Possible left L5 DRG RFA Diagnostic bilateral cervical facet block Possible bilateral cervical facetRFA Diagnosticleft IA shoulder injection Diagnosticleft suprascapular NB Diagnostic left suprascapularRFA Diagnostic left lumbar sympathetic block (LSB) #1  Possible left lumbar sympathetic RFA   Palliative PRN treatment(s):   Therapeutic/palliative left L5 TFESI #2 + right L4-5 LESI  #2 (100/100/50/100) Palliative bilateral lumbar facet blocks Palliative right lumbar facet RFA #2 (last done 04/22/2018) Palliative left lumbar facet RFA #2 (last done 03/13/2018)       Recent Visits Date Type Provider Dept  10/06/19 Office Visit Milinda Pointer, MD [facility]  09/21/19 Office Visit Milinda Pointer, MD [facility]  09/09/19 Telemedicine Milinda Pointer, MD [facility]  Showing recent visits within past 90 days and meeting all other requirements Today's Visits Date Type Provider Dept  10/22/19 Procedure visit Milinda Pointer, MD [facility]  Showing today's visits and meeting all other requirements Future Appointments Date Type Provider Dept  11/17/19 Appointment Milinda Pointer, MD [facility]  Showing future appointments within next 90 days and meeting all other requirements  Disposition: Discharge home  Discharge (Date  Time): 10/22/2019; 1102 hrs.   Primary Care Physician: Sharyne Peach, MD Location: [facility] Note by: Gaspar Cola, MD Date: 10/22/2019; Time: 11:10 AM  Disclaimer:  Medicine is not an Chief Strategy Officer. The only guarantee in medicine is that nothing is guaranteed. It is important to note that the decision to proceed with this intervention was based on the information collected from the patient. The Data and conclusions were drawn from the patient's questionnaire, the interview, and the physical examination. Because the information was provided in large part by the patient, it cannot be guaranteed that it has not been purposely or unconsciously manipulated. Every effort has been made to obtain as much relevant data as possible for this evaluation. It is important to note that the conclusions that lead to this procedure are derived in large part from the available data. Always take into account that the treatment will also be dependent on availability of  resources and existing treatment guidelines, considered by other Pain Management Practitioners as being common knowledge and practice, at the time of the intervention. For Medico-Legal purposes, it is also important to point out that variation in procedural techniques and pharmacological choices are the acceptable norm. The indications, contraindications,  technique, and results of the above procedure should only be interpreted and judged by a Board-Certified Interventional Pain Specialist with extensive familiarity and expertise in the same exact procedure and technique.

## 2019-10-22 NOTE — Progress Notes (Signed)
Safety precautions to be maintained throughout the outpatient stay will include: orient to surroundings, keep bed in low position, maintain call bell within reach at all times, provide assistance with transfer out of bed and ambulation.  

## 2019-10-22 NOTE — Patient Instructions (Signed)

## 2019-10-23 ENCOUNTER — Telehealth: Payer: Self-pay

## 2019-10-23 NOTE — Telephone Encounter (Signed)
Post procedure phone call.  Patient states she is hurting in the left leg.  Instructed to use heat today and top call us for any further questions or concerns.

## 2019-10-25 ENCOUNTER — Emergency Department
Admission: EM | Admit: 2019-10-25 | Discharge: 2019-10-25 | Disposition: A | Discharge status: Home / Self Care | Payer: Medicare HMO | Attending: Emergency Medicine | Admitting: Emergency Medicine

## 2019-10-25 ENCOUNTER — Emergency Department: Payer: Medicare HMO

## 2019-10-25 ENCOUNTER — Other Ambulatory Visit: Payer: Self-pay

## 2019-10-25 DIAGNOSIS — I130 Hypertensive heart and chronic kidney disease with heart failure and stage 1 through stage 4 chronic kidney disease, or unspecified chronic kidney disease: Secondary | ICD-10-CM | POA: Insufficient documentation

## 2019-10-25 DIAGNOSIS — I2510 Atherosclerotic heart disease of native coronary artery without angina pectoris: Secondary | ICD-10-CM | POA: Diagnosis not present

## 2019-10-25 DIAGNOSIS — Z7951 Long term (current) use of inhaled steroids: Secondary | ICD-10-CM | POA: Diagnosis not present

## 2019-10-25 DIAGNOSIS — I509 Heart failure, unspecified: Secondary | ICD-10-CM | POA: Diagnosis not present

## 2019-10-25 DIAGNOSIS — E039 Hypothyroidism, unspecified: Secondary | ICD-10-CM | POA: Insufficient documentation

## 2019-10-25 DIAGNOSIS — R1031 Right lower quadrant pain: Secondary | ICD-10-CM

## 2019-10-25 DIAGNOSIS — Z79899 Other long term (current) drug therapy: Secondary | ICD-10-CM | POA: Diagnosis not present

## 2019-10-25 DIAGNOSIS — J449 Chronic obstructive pulmonary disease, unspecified: Secondary | ICD-10-CM | POA: Insufficient documentation

## 2019-10-25 DIAGNOSIS — Z79890 Hormone replacement therapy (postmenopausal): Secondary | ICD-10-CM | POA: Diagnosis not present

## 2019-10-25 DIAGNOSIS — N183 Chronic kidney disease, stage 3 unspecified: Secondary | ICD-10-CM | POA: Insufficient documentation

## 2019-10-25 DIAGNOSIS — Z8673 Personal history of transient ischemic attack (TIA), and cerebral infarction without residual deficits: Secondary | ICD-10-CM | POA: Diagnosis not present

## 2019-10-25 DIAGNOSIS — E1122 Type 2 diabetes mellitus with diabetic chronic kidney disease: Secondary | ICD-10-CM | POA: Diagnosis not present

## 2019-10-25 DIAGNOSIS — I252 Old myocardial infarction: Secondary | ICD-10-CM | POA: Diagnosis not present

## 2019-10-25 DIAGNOSIS — Z7901 Long term (current) use of anticoagulants: Secondary | ICD-10-CM | POA: Diagnosis not present

## 2019-10-25 DIAGNOSIS — Z7984 Long term (current) use of oral hypoglycemic drugs: Secondary | ICD-10-CM | POA: Diagnosis not present

## 2019-10-25 DIAGNOSIS — Z87891 Personal history of nicotine dependence: Secondary | ICD-10-CM | POA: Diagnosis not present

## 2019-10-25 LAB — URINALYSIS, COMPLETE (UACMP) WITH MICROSCOPIC
Bilirubin Urine: NEGATIVE
Glucose, UA: NEGATIVE mg/dL
Hgb urine dipstick: NEGATIVE
Ketones, ur: NEGATIVE mg/dL
Leukocytes,Ua: NEGATIVE
Nitrite: NEGATIVE
Protein, ur: NEGATIVE mg/dL
Specific Gravity, Urine: 1.013 (ref 1.005–1.030)
pH: 5 (ref 5.0–8.0)

## 2019-10-25 LAB — CBC
HCT: 34.9 % — ABNORMAL LOW (ref 36.0–46.0)
Hemoglobin: 11.9 g/dL — ABNORMAL LOW (ref 12.0–15.0)
MCH: 29.2 pg (ref 26.0–34.0)
MCHC: 34.1 g/dL (ref 30.0–36.0)
MCV: 85.5 fL (ref 80.0–100.0)
Platelets: 225 10*3/uL (ref 150–400)
RBC: 4.08 MIL/uL (ref 3.87–5.11)
RDW: 14.3 % (ref 11.5–15.5)
WBC: 7.2 10*3/uL (ref 4.0–10.5)
nRBC: 0 % (ref 0.0–0.2)

## 2019-10-25 LAB — COMPREHENSIVE METABOLIC PANEL
ALT: 21 U/L (ref 0–44)
AST: 22 U/L (ref 15–41)
Albumin: 4.1 g/dL (ref 3.5–5.0)
Alkaline Phosphatase: 119 U/L (ref 38–126)
Anion gap: 13 (ref 5–15)
BUN: 21 mg/dL — ABNORMAL HIGH (ref 6–20)
CO2: 24 mmol/L (ref 22–32)
Calcium: 8.7 mg/dL — ABNORMAL LOW (ref 8.9–10.3)
Chloride: 103 mmol/L (ref 98–111)
Creatinine, Ser: 1.16 mg/dL — ABNORMAL HIGH (ref 0.44–1.00)
GFR calc Af Amer: >60 mL/min (ref >60)
GFR calc non Af Amer: 56 mL/min — ABNORMAL LOW (ref >60)
Glucose, Bld: 206 mg/dL — ABNORMAL HIGH (ref 70–99)
Potassium: 3.4 mmol/L — ABNORMAL LOW (ref 3.5–5.1)
Sodium: 140 mmol/L (ref 135–145)
Total Bilirubin: 0.6 mg/dL (ref 0.3–1.2)
Total Protein: 7.5 g/dL (ref 6.5–8.1)

## 2019-10-25 LAB — LIPASE, BLOOD: Lipase: 38 U/L (ref 11–51)

## 2019-10-25 MED ORDER — IOHEXOL 300 MG/ML  SOLN
100.0000 mL | Freq: Once | INTRAMUSCULAR | Status: AC | PRN
  Administered 2019-10-25: 100 mL via INTRAVENOUS

## 2019-10-25 MED ORDER — MORPHINE SULFATE (PF) 4 MG/ML IV SOLN
4.0000 mg | Freq: Once | INTRAVENOUS | Status: AC
  Administered 2019-10-25: 4 mg via INTRAVENOUS
  Filled 2019-10-25: qty 1

## 2019-10-25 MED ORDER — TIZANIDINE HCL 4 MG PO TABS: 4.0000 mg | ORAL_TABLET | Freq: Three times a day (TID) | ORAL | 0 refills | Status: AC | PRN

## 2019-10-25 MED ORDER — ONDANSETRON HCL 4 MG/2ML IJ SOLN
4.0000 mg | Freq: Once | INTRAMUSCULAR | Status: AC
  Administered 2019-10-25: 4 mg via INTRAVENOUS
  Filled 2019-10-25: qty 2

## 2019-10-25 MED ORDER — HYDROCODONE-ACETAMINOPHEN 5-325 MG PO TABS: 1.0000 | ORAL_TABLET | Freq: Four times a day (QID) | ORAL | 0 refills | Status: AC | PRN

## 2019-10-25 NOTE — ED Notes (Signed)
Pt given ice chips per request

## 2019-10-25 NOTE — ED Provider Notes (Signed)
[facility] Provider Note ____________________________________________   First MD Initiated Contact with Patient 10/25/19 1806     (approximate)  I have reviewed the triage vital signs and the nursing notes.   HISTORY  Chief Complaint Abdominal Pain  HPI Marie Clarke is a 49 y.o. female with history of multiple chronic medical issues presents to the emergency department for treatment and evaluation of acute onset right lower quadrant abdominal pain.  Pain was present to some degree yesterday but did not last very long.  Today, the pain started just after eating and has continued.  Pain increases with movement and is better if she pushes in on her abdomen over the area of pain.  She does have her appendix.  She had a complete hysterectomy.  No right upper quadrant pain, nausea, or vomiting.      Past Medical History:  Diagnosis Date  . Anemia   . Anginal pain ([place])   . Anxiety   . Arthritis   . Asthma   . Bilateral lower extremity edema   . Bipolar disorder ([place])   . CAD (coronary artery disease) unk  . CHF (congestive heart failure) ([place])   . Chronic pain syndrome   . COPD (chronic obstructive pulmonary disease) (HCC)    emphysema  . DDD (degenerative disc disease), lumbar   . Depression unk  . Diabetes mellitus without complication ([place])   . Diabetes mellitus, type II ([place])   . Drug overdose May 10, 2013   after dad died  . Dysrhythmia    history of tachy arrythmias  . Fibromyalgia   . GERD (gastroesophageal reflux disease)   . Headache    chronic migraines  . Hyperlipidemia   . Hypertension   . Hypothyroidism   . Left leg pain 04/29/2014  . MI (myocardial infarction) ([place]) 05-10-2005  . Muscle ache 09/16/2014  . Osteoporosis   . Overactive bladder   . Pancreatitis unk  . Panic attacks   . PTSD (post-traumatic stress disorder)   . Reflex sympathetic dystrophy   . Renal cyst, left   . Renal insufficiency    ckd stage iii  .  Restless legs syndrome   . Sleep apnea 06/2019   uses cpap  . Sleep apnea   . Stroke ([place]) 2008-05-10   TIA x 2. no residual deficits  . Suicide attempt [facility])    after father died.  . Thyroid disease    thyroid nodule  . TIA (transient ischemic attack) unk  . TIA (transient ischemic attack)     Patient Active Problem List   Diagnosis Date Noted  . Presence of neurostimulator (SCS) (June 2021) 10/06/2019  . Pain due to any device, implant or graft 09/21/2019  . Anxiety 09/10/2019  . Involuntary movements 09/10/2019  . Abnormal involuntary movement 08/18/2019  . Bipolar disorder, in full remission, most recent episode mixed ([place]) 08/11/2019  . Lumbosacral radiculopathy at L5 (Right) 04/09/2019  . Chronic migraine 03/12/2019  . Gout 01/21/2019  . Noncompliance with treatment regimen 12/29/2018  . BMI 40.0-44.9, adult ([place]) 11/19/2018  . AKI (acute kidney injury) ([place]) 11/17/2018  . Postural dizziness with presyncope 09/29/2018  . Bipolar I disorder, most recent episode mixed ([place]) 09/10/2018  . Panic attacks 09/10/2018  . Chronic lumbosacral L5-S1 IVD protrusion (Bilateral) 08/25/2018  . Lumbar lateral recess stenosis (L5-S1) (Bilateral) 08/25/2018  . Wound disruption 08/05/2018  . Osteoarthritis involving multiple joints 07/10/2018  . Long term current use of non-steroidal anti-inflammatories (NSAID) 07/10/2018  .  NSAID induced gastritis 07/10/2018  . DDD (degenerative disc disease), lumbosacral 04/22/2018  . Disease related peripheral neuropathy 11/13/2017  . Gastroesophageal reflux disease without esophagitis 11/13/2017  . Occipital headache (Bilateral) 11/13/2017  . Cervicogenic headache (Bilateral) (L>R) 11/13/2017  . History of postoperative nausea 10/22/2017  . Chronic shoulder pain (Fifth Area of Pain) (Bilateral) (L>R) 10/09/2017  . CRPS (complex regional pain syndrome) type 1 of lower limb (left ankle) 10/09/2017  . Ankle joint instability (Left) 10/09/2017  . Ankle  sprain, sequela (Left) 10/09/2017  . History of psychiatric symptoms 10/09/2017  . Chronic pain syndrome 10/02/2017  . Spondylosis without myelopathy or radiculopathy, lumbosacral region 10/02/2017  . Chronic musculoskeletal pain 10/02/2017  . Elevated C-reactive protein (CRP) 09/10/2017  . Elevated sed rate 09/10/2017  . Chronic neck pain (Fourth Area of Pain) (Bilateral) (L>R) 09/09/2017  . Pharmacologic therapy 09/09/2017  . Disorder of skeletal system 09/09/2017  . Problems influencing health status 09/09/2017  . Long term current use of opiate analgesic 09/09/2017  . Tobacco use disorder 07/16/2017  . Ventral hernia without obstruction or gangrene 06/25/2017  . Strain of extensor muscle, fascia and tendon of left index finger at wrist and hand level, initial encounter 05/16/2017  . Sepsis ([place]) 04/14/2017  . Osteopenia 04/03/2017  . Hypotension 09/17/2016  . Contusion of knee (Left) 10/12/2015  . Strain of knee (Left) 10/12/2015  . Incidental lung nodule 04/28/2015  . Chronic low back pain (Primary Area of Pain) (Bilateral) (L>R) 03/28/2015  . Chronic lower extremity pain (Referred) (Secondary Area of Pain) (Left) 03/28/2015  . Abdominal wound dehiscence 03/28/2015  . Encounter for pain management planning 03/28/2015  . Morbid obesity ([place]) 03/28/2015  . Abnormal CT scan, lumbar spine 03/28/2015  . Lumbar facet hypertrophy 03/28/2015  . Lumbar facet syndrome (Bilateral) (L>R) 03/28/2015  . Lumbar foraminal stenosis (Bilateral) (L5-S1) 03/28/2015  . Chronic ankle pain [facility] Area of Pain) (Left) 03/28/2015  . Neurogenic pain 03/28/2015  . Neuropathic pain 03/28/2015  . Myofascial pain 03/28/2015  . History of suicide attempt 03/28/2015  . PTSD (post-traumatic stress disorder) 01/13/2015  . Abnormal gait 12/15/2014  . Congestive heart failure ([place]) 11/15/2014  . Abdominal wall abscess 09/20/2014  . 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 Diabetes mellitus, type 2 ([place])  08/13/2014  . Bipolar affective disorder ([place]) 08/13/2014  . Type 2 diabetes mellitus ([place]) 08/13/2014  . Rectal prolapse 08/09/2014  . Rectal bleeding 08/09/2014  . Rectal bleed 08/09/2014  . Affective bipolar disorder ([place]) 08/05/2014  . Arteriosclerosis of coronary artery 08/05/2014  . CCF (congestive cardiac failure) ([place]) 08/05/2014  . Chronic kidney disease 08/05/2014  . 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 Apnea, sleep 08/05/2014  . Temporary cerebral vascular dysfunction 08/05/2014  . Polypharmacy 04/29/2014  . Other long term (current) drug therapy 04/29/2014  . 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 Chronic kidney disease, stage III (moderate) ([place]) 12/14/2013  . Controlled diabetes mellitus type II without complication ([place]) 43/88/8757  . Essential (primary) hypertension 12/03/2013  . Adult hypothyroidism 12/03/2013  . Controlled type 2 diabetes mellitus without complication ([place]) 97/28/2060    Past Surgical History:  Procedure Laterality Date  . ABDOMINAL HYSTERECTOMY    . CARDIAC CATHETERIZATION    . HERNIA REPAIR  07/15/2017   [facility]  . lumbar facet     several  . prolapse rectum surgery N/A July 2016  . THORACIC LAMINECTOMY FOR SPINAL CORD STIMULATOR N/A 07/27/2019   Procedure: THORACIC SPINAL CORD STIMULATOR PLACEMENT WITH RIGHT FLANK PULSE GENERATOR;  Surgeon: Lacinda Axon,  Remo Lipps, MD;  Location: [facility] ORS;  Service: Neurosurgery;  Laterality: N/A;  . TONSILLECTOMY      Prior to Admission medications   Medication Sig Start Date End Date Taking? Authorizing Provider  atorvastatin (LIPITOR) 40 MG tablet Take 40 mg by mouth at bedtime.     [provider]  B Complex-C (B-COMPLEX WITH VITAMIN C) tablet Take by mouth.    [provider]  benztropine (COGENTIN) 0.5 MG tablet Take 1 tablet (0.5 mg total) by mouth 2 (two) times daily. Abnormal movements 10/14/19   Ursula Alert, MD  busPIRone (BUSPAR) 15 MG tablet Take 1 tablet (15 mg total) by mouth 2 (two)  times daily. 10/14/19   Ursula Alert, MD  Cholecalciferol (VITAMIN D3) 125 MCG (5000 UT) TABS Take by mouth.    [provider]  Continuous Blood Gluc Receiver (DEXCOM G6 RECEIVER) DEVI 1 device use as directed annually 10/09/19   [provider]  Continuous Blood Gluc Sensor (DEXCOM G6 SENSOR) MISC Use 1 each every 30 (thirty) days 10/09/19   [provider]  Continuous Blood Gluc Transmit (DEXCOM G6 TRANSMITTER) MISC Use 1 Device every 3 (three) months 10/09/19   [provider]  darifenacin (ENABLEX) 7.5 MG 24 hr tablet Take 7.5 mg by mouth at bedtime. Use as needed for OAB 03/18/19 03/17/20  [provider]  desvenlafaxine (PRISTIQ) 50 MG 24 hr tablet Take 1 tablet (50 mg total) by mouth at bedtime. 10/14/19   Ursula Alert, MD  dicyclomine (BENTYL) 10 MG capsule 3 (three) times daily.     [provider]  Dulaglutide 0.75 MG/0.5ML SOPN Inject 0.75 mg into the skin every Saturday.  07/15/18   [provider]  famotidine (PEPCID) 20 MG tablet Take 40 mg by mouth daily.  04/01/19   [provider]  fluticasone (FLONASE) 50 MCG/ACT nasal spray Place into the nose. 06/03/19   [provider]  gabapentin (NEURONTIN) 600 MG tablet Take by mouth 3 (three) times daily.  04/21/19 04/20/20  [provider]  glucose blood test strip OneTouch Verio test strips    [provider]  HYDROcodone-acetaminophen (NORCO/VICODIN) 5-325 MG tablet Take 1 tablet by mouth every 6 (six) hours as needed for up to 3 days for severe pain. 10/25/19 10/28/19  Harwood Nall, Johnette Abraham B, FNP  hydrOXYzine (ATARAX/VISTARIL) 25 MG tablet Take 1 tablet (25 mg total) by mouth every 6 (six) hours as needed. Patient taking differently: Take 25 mg by mouth every 6 (six) hours as needed for itching. Primarily for itching 06/17/19   Ursula Alert, MD  KLOR-CON M20 20 MEQ tablet Take 0.5 tablets (10 mEq total) by mouth 2 (two) times daily. Patient taking  differently: Take 20 mEq by mouth 2 (two) times daily.  11/18/18   Mayo, Pete Pelt, MD  Lancets (ACCU-CHEK SAFE-T PRO) lancets  11/21/18   [provider]  levocetirizine (XYZAL) 5 MG tablet Take 5 mg by mouth daily. Takes at night 06/04/18   [provider]  levothyroxine (SYNTHROID) 25 MCG tablet  10/16/19   [provider]  levothyroxine (SYNTHROID, LEVOTHROID) 200 MCG tablet Take 200 mcg by mouth daily. (254mg total daily)    [provider]  meloxicam (MOBIC) 15 MG tablet Take 1 tablet (15 mg total) by mouth daily. 10/06/19 11/05/19  NMilinda Pointer MD  metoprolol succinate (TOPROL-XL) 25 MG 24 hr tablet Take 25 mg by mouth daily. Takes in the evening 02/27/19   [provider]  montelukast (SINGULAIR) 10 MG  tablet Take 10 mg by mouth daily. Takes in the afternoon    [provider]  nitroGLYCERIN (NITROSTAT) 0.4 MG SL tablet Place 0.4 mg under the tongue every 5 (five) minutes as needed for chest pain.     [provider]  QUEtiapine (SEROQUEL) 100 MG tablet Take 1 tablet (100 mg total) by mouth at bedtime. Dose reduction Patient taking differently: Take by mouth at bedtime. 125 mg at bedtime, 25 mg in a.m. 08/18/19   Ursula Alert, MD  rizatriptan (MAXALT) 10 MG tablet Take 1 tablet (10 mg total) by mouth as needed. 01/21/19 09/08/19  Milinda Pointer, MD  tiZANidine (ZANAFLEX) 4 MG tablet Take 1 tablet (4 mg total) by mouth 3 (three) times daily as needed for up to 14 days for muscle spasms. 10/25/19 11/08/19  Gladyes Kudo, Johnette Abraham B, FNP  topiramate (TOPAMAX) 50 MG tablet Take 50 mg by mouth 2 (two) times daily.    [provider]  torsemide (DEMADEX) 20 MG tablet Take 1 tablet (20 mg total) by mouth 2 (two) times daily. 11/18/18 09/08/19  Mayo, Pete Pelt, MD  TRELEGY ELLIPTA 100-62.5-25 MCG/INH AEPB Inhale 1 puff into the lungs daily. Takes at night    [provider]  VENTOLIN HFA 108 (90 Base) MCG/ACT inhaler Inhale 1-2  puffs into the lungs every 6 (six) hours as needed for wheezing or shortness of breath.  05/03/18   [provider]  vitamin E 400 UNIT capsule 400 Units daily.     [provider]  methocarbamol (ROBAXIN) 750 MG tablet Take 1 tablet (750 mg total) by mouth every 8 (eight) hours as needed for muscle spasms. 09/09/19 10/25/19  Milinda Pointer, MD    Allergies Diazepam, Ziprasidone hcl, Azithromycin, Divalproex sodium, Levofloxacin, Lisinopril, Metronidazole, Sulfa antibiotics, Valproic acid, Cephalexin, Ciprofloxacin, and Penicillins  Family History  Problem Relation Age of Onset  . Diabetes Mellitus II Mother   . CAD Mother   . Sleep apnea Mother   . Osteoarthritis Mother   . Osteoporosis Mother   . Anxiety disorder Mother   . Depression Mother   . Bipolar disorder Mother   . Bipolar disorder Father   . Hypertension Father   . Depression Father   . Anxiety disorder Father   . Post-traumatic stress disorder Sister     Social History Social History   Tobacco Use  . Smoking status: Former Smoker    Packs/day: 0.50    Types: Cigarettes    Quit date: 09/01/2018    Years since quitting: 1.1  . Smokeless tobacco: Never Used  . Tobacco comment: Patient quit smoking 09/01/2018  Vaping Use  . Vaping Use: Never used  Substance Use Topics  . Alcohol use: No    Alcohol/week: 0.0 standard drinks  . Drug use: No    Review of Systems  Constitutional: No fever/chills Eyes: No visual changes. ENT: No sore throat. Cardiovascular: Denies chest pain. Respiratory: Denies shortness of breath. Gastrointestinal: Positive for abdominal pain.  Intermittent nausea secondary to pain.  No vomiting or diarrhea  genitourinary: Negative for dysuria. Musculoskeletal: Negative for back pain. Skin: Negative for rash. Neurological: Negative for headaches, focal weakness or numbness. ____________________________________________   PHYSICAL EXAM:  VITAL SIGNS: ED Triage Vitals    Enc Vitals Group     BP 10/25/19 1558 126/73     Pulse Rate 10/25/19 1558 (!) 109     Resp 10/25/19 1558 18     Temp 10/25/19 1558 98.4 F (36.9 C)  Temp Source 10/25/19 1558 Oral     SpO2 10/25/19 1558 97 %     Weight 10/25/19 1559 235 lb (106.6 kg)     Height 10/25/19 1559 '5\' 4"'  (1.626 m)     Head Circumference --      Peak Flow --      Pain Score 10/25/19 1558 9     Pain Loc --      Pain Edu? --      Excl. in [place]? --     Constitutional: Alert and oriented. Well appearing and in no acute distress. Eyes: Conjunctivae are normal. Head: Atraumatic. Nose: No congestion/rhinnorhea. Mouth/Throat: Mucous membranes are moist.  Oropharynx non-erythematous. Neck: No stridor.   Hematological/Lymphatic/Immunilogical: No cervical lymphadenopathy. Cardiovascular: Normal rate, regular rhythm. Grossly normal heart sounds.  Good peripheral circulation. Respiratory: Normal respiratory effort.  No retractions. Lungs CTAB. Gastrointestinal: Soft and tender over the right lower quadrant.  No right upper quadrant tenderness.. No distention. No abdominal bruits. Genitourinary:  Musculoskeletal: No lower extremity tenderness nor edema.  No joint effusions. Neurologic:  Normal speech and language. No gross focal neurologic deficits are appreciated. No gait instability. Skin:  Skin is warm, dry and intact. No rash noted. Psychiatric: Mood and affect are normal. Speech and behavior are normal.  ____________________________________________   LABS (all labs ordered are listed, but only abnormal results are displayed)  Labs Reviewed  COMPREHENSIVE METABOLIC PANEL - Abnormal; Notable for the following components:      Result Value   Potassium 3.4 (*)    Glucose, Bld 206 (*)    BUN 21 (*)    Creatinine, Ser 1.16 (*)    Calcium 8.7 (*)    GFR calc non Af Amer 56 (*)    All other components within normal limits  CBC - Abnormal; Notable for the following components:   Hemoglobin 11.9 (*)    HCT  34.9 (*)    All other components within normal limits  URINALYSIS, COMPLETE (UACMP) WITH MICROSCOPIC - Abnormal; Notable for the following components:   Color, Urine YELLOW (*)    APPearance HAZY (*)    Bacteria, UA FEW (*)    All other components within normal limits  LIPASE, BLOOD   ____________________________________________  EKG  Not indicated ____________________________________________  RADIOLOGY  ED MD interpretation:    CT of the abdomen pelvis with contrast is negative for acute findings per radiology.  I, Sherrie George, personally viewed and evaluated these images (plain radiographs) as part of my medical decision making, as well as reviewing the written report by the radiologist.  Official radiology report(s): CT ABDOMEN PELVIS W CONTRAST  Result Date: 10/25/2019 CLINICAL DATA:  Right lower quadrant abdominal pain for 3 days. EXAM: CT ABDOMEN AND PELVIS WITH CONTRAST TECHNIQUE: Multidetector CT imaging of the abdomen and pelvis was performed using the standard protocol following bolus administration of intravenous contrast. CONTRAST:  156m OMNIPAQUE IOHEXOL 300 MG/ML  SOLN COMPARISON:  02/07/2019 FINDINGS: Lower chest: The lung bases are clear of an acute process. No pleural effusions or pulmonary lesions. The heart is normal in size. Hepatobiliary: No focal hepatic lesions or intrahepatic biliary dilatation. The gallbladder is normal. No common bile duct dilatation. Pancreas: No mass, inflammation or ductal dilatation. Spleen: Normal size.  No focal lesions. Adrenals/Urinary Tract: Stable left adrenal gland nodule, unchanged since 2019 and most consistent with a benign adenoma. No worrisome renal lesions or hydronephrosis. No renal or obstructing ureteral calculi. No bladder mass or bladder calculi. Stomach/Bowel: The stomach is  moderately distended with food. The duodenum, small bowel and colon are unremarkable. No acute inflammatory changes, mass lesions or obstructive  findings. The terminal ileum and appendix are normal. Remote surgical changes involving the sigmoid colon. There are small anterior abdominal wall hernias containing small bowel loops and fat. No findings for obstruction or incarceration. Vascular/Lymphatic: The aorta is normal in caliber. No dissection. The branch vessels are patent. The major venous structures are patent. No mesenteric or retroperitoneal mass or adenopathy. Small scattered lymph nodes are noted. Reproductive: Surgically absent. Other: No pelvic mass or adenopathy. No free pelvic fluid collections. No inguinal mass or adenopathy. Musculoskeletal: No significant bony findings. A spinal cord stimulator is noted. IMPRESSION: 1. No acute abdominal/pelvic findings, mass lesions or adenopathy. 2. Small anterior abdominal wall hernias containing small bowel loops and fat. No findings for obstruction or incarceration. 3. Stable left adrenal gland nodule, most consistent with a benign adenoma. Electronically Signed   By: Marijo Sanes M.D.   On: 10/25/2019 17:17    ____________________________________________   PROCEDURES  Procedure(s) performed (including Critical Care):  Procedures  ____________________________________________   INITIAL IMPRESSION / ASSESSMENT AND PLAN   49 year old female presenting to the emergency department for treatment and evaluation of abdominal pain.  See HPI for further details.  While awaiting ER room assignment, protocol lab studies were obtained and are all reassuring.   White blood cell count is normal, lipase is normal.  She does have a very mild hypokalemia which is chronic and she is on potassium daily.  Also very slight bump in her BUN and creatinine but not indicative of acute kidney injury.  ALT AST and alk phos plus bilirubin are all normal.  Urinalysis shows few bacteria but otherwise no indication of acute cystitis.  On exam, she does have tenderness in the right lower quadrant.  DIFFERENTIAL  DIAGNOSIS  Constipation, bowel gas, early appendicitis  ED COURSE  Patient reports significant reduction in pain after medication.  Symptoms most likely muscle spasm.  Area of pain is not necessarily directly overlying the normal anatomical position of the appendix.  She has had a complete hysterectomy as well.  Patient was given strict ER return precautions and also advised to follow-up with her primary care provider if symptoms are not improving over the next 24 hours.  Patient states that she takes methocarbamol every day but has felt like that it has not been helping with her her pain.  Plan will be to change to Zanaflex and provide her with a few tablets of Norco.    ___________________________________________   FINAL CLINICAL IMPRESSION(S) / ED DIAGNOSES  Final diagnoses:  Right lower quadrant abdominal pain     ED Discharge Orders         Ordered    tiZANidine (ZANAFLEX) 4 MG tablet  3 times daily PRN        10/25/19 2020    HYDROcodone-acetaminophen (NORCO/VICODIN) 5-325 MG tablet  Every 6 hours PRN        10/25/19 2020           Marie Clarke was evaluated in Emergency Department on 10/25/2019 for the symptoms described in the history of present illness. She was evaluated in the context of the global COVID-19 pandemic, which necessitated consideration that the patient might be at risk for infection with the SARS-CoV-2 virus that causes COVID-19. Institutional protocols and algorithms that pertain to the evaluation of patients at risk for COVID-19 are in a state of rapid change based on information  released by regulatory bodies including the CDC and federal and state organizations. These policies and algorithms were followed during the patient's care in the ED.   Note:  This document was prepared using Dragon voice recognition software and may include unintentional dictation errors.   Victorino Dike, FNP 10/25/19 2024    Lavonia Drafts, MD 10/25/19 2028

## 2019-10-25 NOTE — Discharge Instructions (Signed)
Please follow-up with your primary care provider for symptoms that are not improving over the next 24 hours.  If you develop symptoms that change or worsen and you are unable to see primary care, please return to the emergency department.  Do not take methocarbamol in addition to the Zanaflex prescribed today.

## 2019-10-25 NOTE — ED Triage Notes (Signed)
Pt to the er for right side abd pain x 3 days however it got worse after pt walked out of [facility]. Last BM today. Pt denies vomiting.

## 2019-11-03 ENCOUNTER — Other Ambulatory Visit: Payer: Self-pay | Admitting: Pain Medicine

## 2019-11-03 ENCOUNTER — Encounter: Payer: Self-pay | Admitting: Emergency Medicine

## 2019-11-03 DIAGNOSIS — T889XXA Complication of surgical and medical care, unspecified, initial encounter: Secondary | ICD-10-CM | POA: Diagnosis not present

## 2019-11-03 DIAGNOSIS — R519 Headache, unspecified: Secondary | ICD-10-CM

## 2019-11-03 DIAGNOSIS — Z5321 Procedure and treatment not carried out due to patient leaving prior to being seen by health care provider: Secondary | ICD-10-CM | POA: Insufficient documentation

## 2019-11-03 DIAGNOSIS — M542 Cervicalgia: Secondary | ICD-10-CM | POA: Diagnosis not present

## 2019-11-03 NOTE — ED Triage Notes (Signed)
Pt reports she had cyst removed and since bleeding has not been controlled. Area is to the right side of neck. Stitches in place is one with wound open. No bleeding when assessed by this RN. New bandage in place.

## 2019-11-04 ENCOUNTER — Emergency Department
Admission: EM | Admit: 2019-11-04 | Discharge: 2019-11-04 | Disposition: A | Discharge status: Home / Self Care | Payer: Medicare HMO | Attending: Emergency Medicine | Admitting: Emergency Medicine

## 2019-11-06 ENCOUNTER — Other Ambulatory Visit: Payer: Self-pay | Admitting: Psychiatry

## 2019-11-06 DIAGNOSIS — F410 Panic disorder [episodic paroxysmal anxiety] without agoraphobia: Secondary | ICD-10-CM

## 2019-11-16 ENCOUNTER — Encounter: Payer: Self-pay | Admitting: Pain Medicine

## 2019-11-16 NOTE — Progress Notes (Signed)
Patient: Marie Clarke  Service Category: E/M  Provider: Gaspar Cola, MD  DOB: 07-Jan-1971  DOS: 11/17/2019  Location: Office  MRN: [id number]  Setting: Ambulatory outpatient  Referring Provider: Sharyne Peach, MD  Type: Established Patient  Specialty: Interventional Pain Management  PCP: Sharyne Peach, MD  Location: Remote location  Delivery: TeleHealth     Virtual Encounter - Pain Management PROVIDER NOTE: Information contained herein reflects review and annotations entered in association with encounter. Interpretation of such information and data should be left to medically-trained personnel. Information provided to patient can be located elsewhere in the medical record under "Patient Instructions". Document created using STT-dictation technology, any transcriptional errors that may result from process are unintentional.    Contact & Pharmacy Preferred: [phone] Home: [phone] (home) Mobile: [phone] (mobile) E-mail: [email or web address]  [place], [place] [address] [place] [address] Phone: [phone] Fax: [phone]   Pre-screening  [name] offered "in-person" vs "virtual" encounter. She indicated preferring virtual for this encounter.   Reason COVID-19*  Social distancing based on CDC and AMA recommendations.   I contacted Marie Clarke on 11/17/2019 via telephone.      I clearly identified myself as Gaspar Cola, MD. I verified that I was speaking with the correct person using two identifiers (Name: Bristol Osentoski, and date of birth: 27-Dec-1970).  Consent I sought verbal advanced consent from [facility] for virtual visit interactions. I informed [name] of possible security and privacy concerns, risks, and limitations associated with providing "not-in-person" medical evaluation and management services. I also informed [name] of the availability of "in-person" appointments. Finally, I informed her that there  would be a charge for the virtual visit and that she could be  personally, fully or partially, financially responsible for it. Ms. [place]ma expressed understanding and agreed to proceed.   Historic Elements   Ms. Ziomara Birenbaum is a 49 y.o. year old, female patient evaluated today after our last contact on 11/03/2019. Ms. Klinedinst  has a past medical history of Anemia, Anginal pain ([place]), Anxiety, Arthritis, Asthma, Bilateral lower extremity edema, Bipolar disorder ([place]), CAD (coronary artery disease) ([place]), CHF (congestive heart failure) ([place]), Chronic pain syndrome, COPD (chronic obstructive pulmonary disease) ([place]), DDD (degenerative disc disease), lumbar, Depression ([place]), Diabetes mellitus without complication ([place]), Diabetes mellitus, type II ([place]), Drug overdose (2015), Dysrhythmia, Fibromyalgia, GERD (gastroesophageal reflux disease), Headache, Hyperlipidemia, Hypertension, Hypothyroidism, Left leg pain (04/29/2014), MI (myocardial infarction) ([place]) (2007), Muscle ache (09/16/2014), Osteoporosis, Overactive bladder, Pancreatitis ([place]), Panic attacks, PTSD (post-traumatic stress disorder), Reflex sympathetic dystrophy, Renal cyst, left, Renal insufficiency, Restless legs syndrome, Sleep apnea (06/2019), Sleep apnea, Stroke ([place]) (2010), Suicide attempt [facility]), Thyroid disease, TIA (transient ischemic attack) ([place]), and TIA (transient ischemic attack). She also  has a past surgical history that includes prolapse rectum surgery (N/A, July 2016); Tonsillectomy; Abdominal hysterectomy; Hernia repair (07/15/2017); Cardiac catheterization; lumbar facet; and Thoracic laminectomy for spinal cord stimulator (N/A, 07/27/2019). Ms. Sharps has a current medication list which includes the following prescription(s): atorvastatin, b-complex with vitamin c, benztropine, buspirone, vitamin d3, dexcom g6 receiver, dexcom g6 sensor, dexcom g6 transmitter, darifenacin, desvenlafaxine, dicyclomine, dulaglutide, famotidine, fluticasone, gabapentin,  glucose blood, hydroxyzine, klor-con m20, accu-chek safe-t pro, levocetirizine, levothyroxine, levothyroxine, metoprolol succinate, montelukast, nitroglycerin, quetiapine, topiramate, trelegy ellipta, ventolin hfa, vitamin e, rizatriptan, torsemide, and [DISCONTINUED] methocarbamol. She  reports that she quit smoking about 14 months ago. Her smoking use included cigarettes. She smoked 0.50 packs per day. She has never used  smokeless tobacco. She reports that she does not drink alcohol and does not use drugs. Ms. Peltz is allergic to diazepam, ziprasidone hcl, azithromycin, divalproex sodium, levofloxacin, lisinopril, metronidazole, sulfa antibiotics, valproic acid, cephalexin, ciprofloxacin, and penicillins.   HPI  Today, she is being contacted for a post-procedure assessment. The fact that the patient did not obtain any type of benefit with the diagnostic lumbar sympathetic block would suggest that this pain is not sympathetically mediated and therefore she does not have a complex regional pain syndrome. In reviewing the patient's pharmacotherapy, I noticed that she is taking the Neurontin 600 mg p.o. 3 times daily and she refers that she is not having any type of problems with the medicine. Today I have proposed to the patient to start going up on this medicine slowly, as tolerated, until we get some benefit or we get to the maximum 800 mg 4 times daily. The patient understood the plan and she was in agreement with it. I will call her back in about 3 to 4 weeks to see how she is doing and to adjust the medicine if needed. Once I get the medication optimized, then I can transfer that back to her PCP.  Post-Procedure Evaluation  Procedure (11/03/2019): Diagnostic left lumbar sympathetic block #1 under fluoroscopic guidance and IV sedation Pre-procedure pain level: 8/10 Post-procedure: 0/10 (100% relief)  Sedation: None.  Effectiveness during initial hour after procedure(Ultra-Short Term Relief): 10  %.  Local anesthetic used: Long-acting (4-6 hours) Effectiveness: Defined as any analgesic benefit obtained secondary to the administration of local anesthetics. This carries significant diagnostic value as to the etiological location, or anatomical origin, of the pain. Duration of benefit is expected to coincide with the duration of the local anesthetic used.  Effectiveness during initial 4-6 hours after procedure(Short-Term Relief): 10 %.  Long-term benefit: Defined as any relief past the pharmacologic duration of the local anesthetics.  Effectiveness past the initial 6 hours after procedure(Long-Term Relief): 0 %.  Current benefits: Defined as benefit that persist at this time.   Analgesia:  Back to baseline Function: Back to baseline ROM: Back to baseline  Pharmacotherapy Assessment  Analgesic: No opioid analgesics prescribed by our practice.I have discontinue all opioid analgesics on 09/09/2019 secondary to the patient's disregard towards our medication rules and regulations.  High-Risk due to history of a prior suicidal attempt w/ medications.  PMP revealed obtaining undisclosed opioid analgesics from several providers. MME/day:29m/day.   Monitoring:  PMP: PDMP reviewed during this encounter.       Pharmacotherapy: No side-effects or adverse reactions reported. Compliance: No problems identified. Effectiveness: Clinically acceptable. Plan: Refer to "POC".  UDS:  Summary  Date Value Ref Range Status  07/02/2019 Note  Final    Comment:    ==================================================================== [employer] Select 13 (MW) ==================================================================== Specimen Alert Note: Urinary creatinine is very low; ability to detect some drugs may be compromised; creatinine-normalized drug concentrations should be interpreted with caution. Suggest recollection.  (Creatinine) ==================================================================== Test                             Result       Flag       Units Drug Present and Declared for Prescription Verification   Tramadol                       >62500       EXPECTED   ng/mg creat   O-Desmethyltramadol  22813        EXPECTED   ng/mg creat   N-Desmethyltramadol            11550        EXPECTED   ng/mg creat    Source of tramadol is a prescription medication. O-desmethyltramadol    and N-desmethyltramadol are expected metabolites of tramadol. ==================================================================== Test                      Result    Flag   Units      Ref Range   Creatinine              8         LL     mg/dL      >=20 ==================================================================== Declared Medications:  The flagging and interpretation on this report are based on the  following declared medications.  Unexpected results may arise from  inaccuracies in the declared medications.  **Note: The testing scope of this panel includes these medications:  Tramadol  **Note: The testing scope of this panel does not include the  following reported medications:  Albuterol  Aspirin  Atorvastatin  Atropine (Lomotil)  Buspirone  Carvedilol (Coreg)  Celecoxib (Celebrex)  Colchicine  Cysteamine (Procysbi)  Darifenacin  Dicyclomine (Bentyl)  Diphenoxylate (Lomotil)  Dulaglutide  Eye Drops  Famotidine (Pepcid)  Fluticasone  Fluticasone (Trelegy)  Gabapentin  Hydrochlorothiazide  Hydroxyzine  Levocetirizine (Xyzal)  Levothyroxine  Magnesium (Mag-Ox)  Methocarbamol  Metoprolol  Montelukast (Singulair)  Naproxen  Nitroglycerin  Omeprazole  Ondansetron  Potassium  Prednisone  Pregabalin  Prochlorperazine (Compazine)  Promethazine  Quetiapine (Seroquel)  Rizatriptan (Maxalt)  Ropinirole (Requip)  Solifenacin (Vesicare)  Topiramate (Topamax)  Torsemide  Triamterene   Umeclidinium (Trelegy)  Vilanterol (Trelegy)  Vitamin B  Vitamin C  Vitamin D3  Vitamin E ==================================================================== For clinical consultation, please call [phone]. ====================================================================     Laboratory Chemistry Profile   Renal Lab Results  Component Value Date   BUN 21 (H) 10/25/2019   CREATININE 1.16 (H) 10/25/2019   BCR 16 09/09/2017   GFRAA >60 10/25/2019   GFRNONAA 56 (L) 10/25/2019     Hepatic Lab Results  Component Value Date   AST 22 10/25/2019   ALT 21 10/25/2019   ALBUMIN 4.1 10/25/2019   ALKPHOS 119 10/25/2019   LIPASE 38 10/25/2019     Electrolytes Lab Results  Component Value Date   NA 140 10/25/2019   K 3.4 (L) 10/25/2019   CL 103 10/25/2019   CALCIUM 8.7 (L) 10/25/2019   MG 1.9 09/09/2017     Bone Lab Results  Component Value Date   25OHVITD1 43 09/09/2017   25OHVITD2 <1.0 09/09/2017   25OHVITD3 43 09/09/2017     Inflammation (CRP: Acute Phase) (ESR: Chronic Phase) Lab Results  Component Value Date   CRP 21 (H) 09/09/2017   ESRSEDRATE 50 (H) 09/09/2017   LATICACIDVEN 1.6 01/11/2019       Note: Above Lab results reviewed.  Imaging  CT ABDOMEN PELVIS W CONTRAST CLINICAL DATA:  Right lower quadrant abdominal pain for 3 days.  EXAM: CT ABDOMEN AND PELVIS WITH CONTRAST  TECHNIQUE: Multidetector CT imaging of the abdomen and pelvis was performed using the standard protocol following bolus administration of intravenous contrast.  CONTRAST:  129m OMNIPAQUE IOHEXOL 300 MG/ML  SOLN  COMPARISON:  02/07/2019  FINDINGS: Lower chest: The lung bases are clear of an acute process. No pleural effusions or pulmonary lesions. The heart is  normal in size.  Hepatobiliary: No focal hepatic lesions or intrahepatic biliary dilatation. The gallbladder is normal. No common bile duct dilatation.  Pancreas: No mass, inflammation or ductal  dilatation.  Spleen: Normal size.  No focal lesions.  Adrenals/Urinary Tract: Stable left adrenal gland nodule, unchanged since 2019 and most consistent with a benign adenoma.  No worrisome renal lesions or hydronephrosis. No renal or obstructing ureteral calculi. No bladder mass or bladder calculi.  Stomach/Bowel: The stomach is moderately distended with food. The duodenum, small bowel and colon are unremarkable. No acute inflammatory changes, mass lesions or obstructive findings. The terminal ileum and appendix are normal. Remote surgical changes involving the sigmoid colon. There are small anterior abdominal wall hernias containing small bowel loops and fat. No findings for obstruction or incarceration.  Vascular/Lymphatic: The aorta is normal in caliber. No dissection. The branch vessels are patent. The major venous structures are patent. No mesenteric or retroperitoneal mass or adenopathy. Small scattered lymph nodes are noted.  Reproductive: Surgically absent.  Other: No pelvic mass or adenopathy. No free pelvic fluid collections. No inguinal mass or adenopathy.  Musculoskeletal: No significant bony findings. A spinal cord stimulator is noted.  IMPRESSION: 1. No acute abdominal/pelvic findings, mass lesions or adenopathy. 2. Small anterior abdominal wall hernias containing small bowel loops and fat. No findings for obstruction or incarceration. 3. Stable left adrenal gland nodule, most consistent with a benign adenoma.  Electronically Signed   By: Marijo Sanes M.D.   On: 10/25/2019 17:17  Assessment  The primary encounter diagnosis was Chronic pain syndrome. Diagnoses of Chronic ankle pain (Tertiary Area of Pain) (Left), Chronic low back pain (Primary Area of Pain) (Bilateral) (L>R), Chronic lower extremity pain (Referred) (Secondary Area of Pain) (Left), Neuropathic pain, and Neurogenic pain were also pertinent to this visit.  Plan of Care  Problem-specific:  No  problem-specific Assessment & Plan notes found for this encounter.  Ms. Cristal Qadir has a current medication list which includes the following long-term medication(s): atorvastatin, benztropine, desvenlafaxine, fluticasone, gabapentin, klor-con m20, levothyroxine, levothyroxine, montelukast, nitroglycerin, quetiapine, rizatriptan, torsemide, and [DISCONTINUED] methocarbamol.  Pharmacotherapy (Medications Ordered): Meds ordered this encounter  Medications  . gabapentin (NEURONTIN) 600 MG tablet    Sig: Take 1 tablet (600 mg total) by mouth in the morning, at noon, in the evening, and at bedtime.    Dispense:  120 tablet    Refill:  0   Orders:  No orders of the defined types were placed in this encounter.  Follow-up plan:   Return in about 3 weeks (around 12/08/2019) for (VV), (Med Mgmt) to evaluate changes on Neurontin.      Interventional management options:  Considering:   NOTE: The patient is medical psychology evaluation indicates that the patient is a "suitable candidate" for a spinal cord stimulator trial. Possible lumbar spinal cord stimulator implant(Trail done 04/22/2019.  She is currently pending implant.)  Diagnosticbilateral lumbar facet block Possible bilateral lumbar facetRFA Diagnostic left L5 TFESI Diagnostic left L5 SNRB Possible left L5 DRG RFA Diagnostic bilateral cervical facet block Possible bilateral cervical facetRFA Diagnosticleft IA shoulder injection Diagnosticleft suprascapular NB Diagnostic left suprascapularRFA Diagnostic left lumbar sympathetic block (LSB) #1  Possible left lumbar sympathetic RFA   Palliative PRN treatment(s):   Therapeutic/palliative left L5 TFESI #2 + right L4-5 LESI #2 (100/100/50/100) Palliative bilateral lumbar facet blocks Palliative right lumbar facet RFA #2 (last done 04/22/2018) Palliative left lumbar facet RFA #2 (last done 03/13/2018)        Recent Visits  Date Type Provider Dept  10/22/19 Procedure  visit Milinda Pointer, MD [facility]  10/06/19 Office Visit Milinda Pointer, MD [facility]  09/21/19 Office Visit Milinda Pointer, MD [facility]  09/09/19 Telemedicine Milinda Pointer, MD [facility]  Showing recent visits within past 90 days and meeting all other requirements Today's Visits Date Type Provider Dept  11/17/19 Telemedicine Milinda Pointer, MD [facility]  Showing today's visits and meeting all other requirements Future Appointments No visits were found meeting these conditions. Showing future appointments within next 90 days and meeting all other requirements  I discussed the assessment and treatment plan with the patient. The patient was provided an opportunity to ask questions and all were answered. The patient agreed with the plan and demonstrated an understanding of the instructions.  Patient advised to call back or seek an in-person evaluation if the symptoms or condition worsens.  Duration of encounter: 15 minutes.  Note by: Gaspar Cola, MD Date: 11/17/2019; Time: 3:24 PM

## 2019-11-17 ENCOUNTER — Ambulatory Visit: Payer: Medicare HMO | Attending: Pain Medicine | Admitting: Pain Medicine

## 2019-11-17 ENCOUNTER — Other Ambulatory Visit: Payer: Self-pay

## 2019-11-17 DIAGNOSIS — G8929 Other chronic pain: Secondary | ICD-10-CM

## 2019-11-17 DIAGNOSIS — G894 Chronic pain syndrome: Secondary | ICD-10-CM

## 2019-11-17 DIAGNOSIS — M545 Low back pain, unspecified: Secondary | ICD-10-CM

## 2019-11-17 DIAGNOSIS — M79605 Pain in left leg: Secondary | ICD-10-CM

## 2019-11-17 DIAGNOSIS — M25572 Pain in left ankle and joints of left foot: Secondary | ICD-10-CM | POA: Diagnosis not present

## 2019-11-17 DIAGNOSIS — M792 Neuralgia and neuritis, unspecified: Secondary | ICD-10-CM

## 2019-11-17 MED ORDER — GABAPENTIN 600 MG PO TABS: 600.0000 mg | ORAL_TABLET | Freq: Four times a day (QID) | ORAL | 0 refills | Status: DC

## 2019-11-25 ENCOUNTER — Encounter: Payer: Self-pay | Admitting: Pain Medicine

## 2019-11-25 ENCOUNTER — Other Ambulatory Visit: Payer: Self-pay

## 2019-11-25 ENCOUNTER — Telehealth (INDEPENDENT_AMBULATORY_CARE_PROVIDER_SITE_OTHER): Payer: Medicare HMO | Admitting: Psychiatry

## 2019-11-25 ENCOUNTER — Encounter: Payer: Self-pay | Admitting: Psychiatry

## 2019-11-25 DIAGNOSIS — F4310 Post-traumatic stress disorder, unspecified: Secondary | ICD-10-CM | POA: Diagnosis not present

## 2019-11-25 DIAGNOSIS — R259 Unspecified abnormal involuntary movements: Secondary | ICD-10-CM

## 2019-11-25 DIAGNOSIS — F3178 Bipolar disorder, in full remission, most recent episode mixed: Secondary | ICD-10-CM

## 2019-11-25 DIAGNOSIS — F410 Panic disorder [episodic paroxysmal anxiety] without agoraphobia: Secondary | ICD-10-CM | POA: Diagnosis not present

## 2019-11-25 MED ORDER — QUETIAPINE FUMARATE 100 MG PO TABS: 100.0000 mg | ORAL_TABLET | Freq: Every day | ORAL | 0 refills | Status: DC

## 2019-11-25 MED ORDER — QUETIAPINE FUMARATE 25 MG PO TABS: 25.0000 mg | ORAL_TABLET | ORAL | 0 refills | Status: DC

## 2019-11-25 NOTE — Progress Notes (Signed)
Provider Location : [facility] Patient Location : Home  Participants: Patient , Provider  Virtual Visit via Telephone Note  I connected with Marie Clarke on 11/25/19 at  8:30 AM EDT by telephone and verified that I am speaking with the correct person using two identifiers.   I discussed the limitations, risks, security and privacy concerns of performing an evaluation and management service by telephone and the availability of in person appointments. I also discussed with the patient that there may be a patient responsible charge related to this service. The patient expressed understanding and agreed to proceed.     I discussed the assessment and treatment plan with the patient. The patient was provided an opportunity to ask questions and all were answered. The patient agreed with the plan and demonstrated an understanding of the instructions.   The patient was advised to call back or seek an in-person evaluation if the symptoms worsen or if the condition fails to improve as anticipated.   [place] MD OP Progress Note  11/25/2019 8:57 AM Richetta Cubillos  MRN:  [id number]  Chief Complaint:  Chief Complaint    Follow-up     HPI: Marie Clarke is a 49 year old Caucasian female, single, lives in [place], has a history of bipolar disorder, PTSD, panic attacks, coronary artery disease, OSA on CPAP, COPD, hyperlipidemia, hypertension, hypothyroidism, abnormal involuntary movements, was evaluated by today by phone today.  Patient today reports since starting the benztropine her abnormal movements of her fingers and the humming noises have improved.  She reports she also does not feel on edge like she used to before.  She reports mood wise she is doing much better.  She denies any mood lability.  She does continue to have pain which does affect her sleep on and off however overall sleep has improved.  She was able to stop the Requip.  She currently takes only gabapentin for pain.  Patient denies any  suicidality, homicidality or perceptual disturbances.  Patient reports she is trying to get involved in church more regularly.  She reports she currently does not believe she needs psychotherapy sessions since she is getting out more and getting involved with more activities.  She denies any other concerns today.  Visit Diagnosis:    ICD-10-CM   1. Bipolar disorder, in full remission, most recent episode mixed (HCC)  F31.78 QUEtiapine (SEROQUEL) 25 MG tablet  2. PTSD (post-traumatic stress disorder)  F43.10 QUEtiapine (SEROQUEL) 25 MG tablet  3. Panic attacks  F41.0 QUEtiapine (SEROQUEL) 25 MG tablet  4. Abnormal involuntary movement  R25.9 QUEtiapine (SEROQUEL) 100 MG tablet    Past Psychiatric History: I have reviewed past psychiatric history from my progress note on 03/06/2017  Past Medical History:  Past Medical History:  Diagnosis Date  . Anemia   . Anginal pain ([place])   . Anxiety   . Arthritis   . Asthma   . Bilateral lower extremity edema   . Bipolar disorder ([place])   . CAD (coronary artery disease) unk  . CHF (congestive heart failure) ([place])   . Chronic pain syndrome   . COPD (chronic obstructive pulmonary disease) (HCC)    emphysema  . DDD (degenerative disc disease), lumbar   . Depression unk  . Diabetes mellitus without complication ([place])   . Diabetes mellitus, type II ([place])   . Drug overdose 05-06-2013   after dad died  . Dysrhythmia    history of tachy arrythmias  . Fibromyalgia   . GERD (gastroesophageal reflux disease)   .  Headache    chronic migraines  . Hyperlipidemia   . Hypertension   . Hypothyroidism   . Left leg pain 04/29/2014  . MI (myocardial infarction) ([place]) 2007  . Muscle ache 09/16/2014  . Osteoporosis   . Overactive bladder   . Pancreatitis unk  . Panic attacks   . PTSD (post-traumatic stress disorder)   . Reflex sympathetic dystrophy   . Renal cyst, left   . Renal insufficiency    ckd stage iii  . Restless legs syndrome   . Sleep apnea  06/2019   uses cpap  . Sleep apnea   . Stroke ([place]) 2010   TIA x 2. no residual deficits  . Suicide attempt [facility])    after father died.  . Thyroid disease    thyroid nodule  . TIA (transient ischemic attack) unk  . TIA (transient ischemic attack)     Past Surgical History:  Procedure Laterality Date  . ABDOMINAL HYSTERECTOMY    . CARDIAC CATHETERIZATION    . HERNIA REPAIR  07/15/2017   [facility]  . lumbar facet     several  . prolapse rectum surgery N/A July 2016  . THORACIC LAMINECTOMY FOR SPINAL CORD STIMULATOR N/A 07/27/2019   Procedure: THORACIC SPINAL CORD STIMULATOR PLACEMENT WITH RIGHT FLANK PULSE GENERATOR;  Surgeon: [name], MD;  Location: [facility] ORS;  Service: Neurosurgery;  Laterality: N/A;  . TONSILLECTOMY      Family Psychiatric History: I have reviewed family psychiatric history from my progress note on 03/06/2017  Family History:  Family History  Problem Relation Age of Onset  . Diabetes Mellitus II Mother   . CAD Mother   . Sleep apnea Mother   . Osteoarthritis Mother   . Osteoporosis Mother   . Anxiety disorder Mother   . Depression Mother   . Bipolar disorder Mother   . Bipolar disorder Father   . Hypertension Father   . Depression Father   . Anxiety disorder Father   . Post-traumatic stress disorder Sister     Social History: I have reviewed social history from my progress note on 03/06/2017 Social History   Socioeconomic History  . Marital status: Single    Spouse name: Not on file  . Number of children: 0  . Years of education: Not on file  . Highest education level: High school graduate  Occupational History    Comment: not employed  Tobacco Use  . Smoking status: Former Smoker    Packs/day: 0.50    Types: Cigarettes    Quit date: 09/01/2018    Years since quitting: 1.2  . Smokeless tobacco: Never Used  . Tobacco comment: Patient quit smoking 09/01/2018  Vaping Use  . Vaping Use: Never used  Substance and Sexual Activity  . Alcohol  use: No    Alcohol/week: 0.0 standard drinks  . Drug use: No  . Sexual activity: Not Currently  Other Topics Concern  . Not on file  Social History Narrative   Patient lives with aunt and uncle.   Social Determinants of Health   Financial Resource Strain:   . Difficulty of Paying Living Expenses: Not on file  Food Insecurity:   . Worried About Charity fundraiser in the Last Year: Not on file  . Ran Out of Food in the Last Year: Not on file  Transportation Needs:   . Lack of Transportation (Medical): Not on file  . Lack of Transportation (Non-Medical): Not on file  Physical Activity:   . Days  of Exercise per Week: Not on file  . Minutes of Exercise per Session: Not on file  Stress:   . Feeling of Stress : Not on file  Social Connections:   . Frequency of Communication with Friends and Family: Not on file  . Frequency of Social Gatherings with Friends and Family: Not on file  . Attends Religious Services: Not on file  . Active Member of Clubs or Organizations: Not on file  . Attends Archivist Meetings: Not on file  . Marital Status: Not on file    Allergies:  Allergies  Allergen Reactions  . Diazepam Hives and Nausea And Vomiting  . Ziprasidone Hcl Other (See Comments) and Itching    CONFUSED CONFUSED Other reaction(s): Other (See Comments) CONFUSED CONFUSED   . Azithromycin Hives  . Divalproex Sodium Itching  . Levofloxacin Hives and Rash    1/31: would like to retry since not taking valium  . Lisinopril     Medicine cause kidney failure  . Metronidazole Hives    1/31: would like to retry since she is no longer taking valium  . Sulfa Antibiotics Hives  . Valproic Acid Itching and Rash  . Cephalexin Hives  . Ciprofloxacin Hives  . Penicillins Nausea And Vomiting    Has patient had a PCN reaction causing immediate rash, facial/tongue/throat swelling, SOB or lightheadedness with hypotension: No Has patient had a PCN reaction causing severe rash  involving mucus membranes or skin necrosis: No Has patient had a PCN reaction that required hospitalization: No Has patient had a PCN reaction occurring within the last 10 years: No If all of the above answers are "NO", then may proceed with Cephalosporin use.     Metabolic Disorder Labs: Lab Results  Component Value Date   HGBA1C 6.3 (H) 09/29/2018   MPG 134.11 09/29/2018   No results found for: PROLACTIN No results found for: CHOL, TRIG, HDL, CHOLHDL, VLDL, LDLCALC Lab Results  Component Value Date   TSH 2.296 11/17/2018   TSH 3.335 09/29/2018    Therapeutic Level Labs: No results found for: LITHIUM Lab Results  Component Value Date   VALPROATE 33 (L) 09/29/2018   No components found for:  CBMZ  Current Medications: Current Outpatient Medications  Medication Sig Dispense Refill  . atorvastatin (LIPITOR) 40 MG tablet Take 40 mg by mouth at bedtime.     . B Complex-C (B-COMPLEX WITH VITAMIN C) tablet Take by mouth.    . benztropine (COGENTIN) 0.5 MG tablet Take 1 tablet (0.5 mg total) by mouth 2 (two) times daily. Abnormal movements 60 tablet 1  . busPIRone (BUSPAR) 15 MG tablet Take 1 tablet (15 mg total) by mouth 2 (two) times daily. 180 tablet 0  . Cholecalciferol (VITAMIN D3) 125 MCG (5000 UT) TABS Take by mouth.    . Continuous Blood Gluc Receiver (DEXCOM G6 RECEIVER) DEVI 1 device use as directed annually    . Continuous Blood Gluc Sensor (DEXCOM G6 SENSOR) MISC Use 1 each every 30 (thirty) days    . Continuous Blood Gluc Transmit (DEXCOM G6 TRANSMITTER) MISC Use 1 Device every 3 (three) months    . darifenacin (ENABLEX) 7.5 MG 24 hr tablet Take 7.5 mg by mouth at bedtime. Use as needed for OAB    . desvenlafaxine (PRISTIQ) 50 MG 24 hr tablet Take 1 tablet (50 mg total) by mouth at bedtime. 90 tablet 0  . dicyclomine (BENTYL) 10 MG capsule 3 (three) times daily.     . famotidine (PEPCID)  20 MG tablet Take 40 mg by mouth daily.     . fluticasone (FLONASE) 50 MCG/ACT  nasal spray Place into the nose.    . gabapentin (NEURONTIN) 600 MG tablet Take 1 tablet (600 mg total) by mouth in the morning, at noon, in the evening, and at bedtime. 120 tablet 0  . glipiZIDE (GLUCOTROL XL) 10 MG 24 hr tablet Take by mouth.    Marland Kitchen glucose blood test strip OneTouch Verio test strips    . hydrOXYzine (ATARAX/VISTARIL) 25 MG tablet TAKE 1 TABLET BY MOUTH EVERY 6 HOURS AS NEEDED 360 tablet 0  . KLOR-CON M20 20 MEQ tablet Take 0.5 tablets (10 mEq total) by mouth 2 (two) times daily. (Patient taking differently: Take 20 mEq by mouth 2 (two) times daily. ) 30 tablet 0  . Lancets (ACCU-CHEK SAFE-T PRO) lancets     . levocetirizine (XYZAL) 5 MG tablet Take 5 mg by mouth daily. Takes at night    . levothyroxine (SYNTHROID) 175 MCG tablet     . metoprolol succinate (TOPROL-XL) 25 MG 24 hr tablet Take 25 mg by mouth daily. Takes in the evening    . montelukast (SINGULAIR) 10 MG tablet Take 10 mg by mouth daily. Takes in the afternoon    . nitroGLYCERIN (NITROSTAT) 0.4 MG SL tablet Place 0.4 mg under the tongue every 5 (five) minutes as needed for chest pain.     Marland Kitchen omeprazole (PRILOSEC) 40 MG capsule     . ondansetron (ZOFRAN-ODT) 8 MG disintegrating tablet     . promethazine (PHENERGAN) 25 MG tablet     . QUEtiapine (SEROQUEL) 100 MG tablet Take 1 tablet (100 mg total) by mouth at bedtime. To be combined with 25 mg at bedtime 90 tablet 0  . rizatriptan (MAXALT) 10 MG tablet Take 1 tablet (10 mg total) by mouth as needed. 10 tablet 5  . sucralfate (CARAFATE) 1 g tablet     . topiramate (TOPAMAX) 50 MG tablet Take 50 mg by mouth 2 (two) times daily.    . TRELEGY ELLIPTA 100-62.5-25 MCG/INH AEPB Inhale 1 puff into the lungs daily. Takes at night    . VENTOLIN HFA 108 (90 Base) MCG/ACT inhaler Inhale 1-2 puffs into the lungs every 6 (six) hours as needed for wheezing or shortness of breath.     . vitamin E 400 UNIT capsule 400 Units daily.     . Dulaglutide 0.75 MG/0.5ML SOPN Inject 0.75 mg  into the skin every Saturday.  (Patient not taking: Reported on 11/25/2019)    . levothyroxine (SYNTHROID) 25 MCG tablet  (Patient not taking: Reported on 11/25/2019)    . levothyroxine (SYNTHROID, LEVOTHROID) 200 MCG tablet Take 200 mcg by mouth daily. (261mcg total daily) (Patient not taking: Reported on 11/25/2019)    . QUEtiapine (SEROQUEL) 25 MG tablet Take 1 tablet (25 mg total) by mouth as directed. Start taking 1 tablet  with 100 mg at bedtime and 1 tablet daily as needed for agitation only 180 tablet 0  . torsemide (DEMADEX) 20 MG tablet Take 1 tablet (20 mg total) by mouth 2 (two) times daily. 60 tablet 0   No current facility-administered medications for this visit.     Musculoskeletal: Strength & Muscle Tone: UTA Gait & Station: UTA Patient leans: N/A  Psychiatric Specialty Exam: Review of Systems  Musculoskeletal: Positive for back pain.  Psychiatric/Behavioral: Positive for sleep disturbance (Improving).  All other systems reviewed and are negative.   There were no vitals taken for  this visit.There is no height or weight on file to calculate BMI.  General Appearance: UTA  Eye Contact:  UTA  Speech:  Clear and Coherent  Volume:  Normal  Mood:  Euthymic  Affect:  UTA  Thought Process:  Goal Directed and Descriptions of Associations: Intact  Orientation:  Full (Time, Place, and Person)  Thought Content: Logical   Suicidal Thoughts:  No  Homicidal Thoughts:  No  Memory:  Immediate;   Fair Recent;   Fair Remote;   Fair  Judgement:  Fair  Insight:  Fair  Psychomotor Activity:  UTA  Concentration:  Concentration: Fair and Attention Span: Fair  Recall:  [employer] of Knowledge: Fair  Language: Fair  Akathisia:  No  Handed:  Right  AIMS (if indicated): UTA  Assets:  Communication Skills Desire for Improvement Housing Social Support  ADL's:  Intact  Cognition: WNL  Sleep:  Improving   Screenings: PHQ2-9     Procedure visit from 10/22/2019 in [place] Procedure visit from 04/22/2018 in [place] Procedure visit from 03/13/2018 in [place] Office Visit from 12/23/2017 in [place] Procedure visit from 12/05/2017 in [facility]  PHQ-2 Total Score 0 0 0 0 0       Assessment and Plan: Piccola Arico is a 49 year old Caucasian female who has a history of bipolar disorder, panic attacks, multiple medical problems including chronic back pain was evaluated by phone today.  Patient is currently making progress with regards to her abnormal involuntary movements as well as her mood symptoms.  Plan as noted below.  Plan Bipolar disorder in remission Seroquel 125 mg p.o. nightly, Seroquel 25 mg every morning. BuSpar 15 mg p.o. twice daily Pristiq 50 mg p.o. nightly  PTSD-stable Continue CBT as needed Pristiq and BuSpar as prescribed  Panic attacks-stable Hydroxyzine 25 mg every 6 hourly as needed for anxiety symptoms  Abnormal involuntary movements-stable Continue Cogentin 0.5 mg p.o. twice daily  Follow-up in clinic in 2 months or sooner if needed.  I have spent atleast 20 minutes non face to face  with patient today. More than 50 % of the time was spent for preparing to see the patient ( e.g., review of test, records ), ordering medications and test ,psychoeducation and supportive psychotherapy and care coordination,as well as documenting clinical information in electronic health record. This note was generated in part or whole with voice recognition software. Voice recognition is usually quite accurate but there are transcription errors that can and very often do occur. I apologize for any typographical errors that were not detected and corrected.       Ursula Alert, MD 11/25/2019, 8:57 AM

## 2019-11-30 ENCOUNTER — Ambulatory Visit: Payer: Medicare HMO | Attending: Internal Medicine

## 2019-11-30 DIAGNOSIS — Z23 Encounter for immunization: Secondary | ICD-10-CM

## 2019-11-30 NOTE — Progress Notes (Signed)
   Covid-19 Vaccination Clinic  Name:  Marie Clarke    MRN: [id number] DOB: 1970/06/15  11/30/2019  [name] was observed post Covid-19 immunization for 15 minutes without incident. She was provided with Vaccine Information Sheet and instruction to access the V-Safe system.   [name] was instructed to call 911 with any severe reactions post vaccine: Marland Kitchen Difficulty breathing  . Swelling of face and throat  . A fast heartbeat  . A bad rash all over body  . Dizziness and weakness

## 2019-12-02 ENCOUNTER — Inpatient Hospital Stay: Payer: Medicare HMO

## 2019-12-02 ENCOUNTER — Encounter: Payer: Self-pay | Admitting: Emergency Medicine

## 2019-12-02 ENCOUNTER — Inpatient Hospital Stay
Admission: EM | Admit: 2019-12-02 | Discharge: 2019-12-09 | DRG: 389 | Disposition: A | Discharge status: Home / Self Care | Payer: Medicare HMO | Attending: Internal Medicine | Admitting: Internal Medicine

## 2019-12-02 ENCOUNTER — Emergency Department: Payer: Medicare HMO

## 2019-12-02 ENCOUNTER — Other Ambulatory Visit: Payer: Self-pay

## 2019-12-02 DIAGNOSIS — Z888 Allergy status to other drugs, medicaments and biological substances status: Secondary | ICD-10-CM

## 2019-12-02 DIAGNOSIS — F319 Bipolar disorder, unspecified: Secondary | ICD-10-CM | POA: Diagnosis present

## 2019-12-02 DIAGNOSIS — N1831 Chronic kidney disease, stage 3a: Secondary | ICD-10-CM | POA: Diagnosis present

## 2019-12-02 DIAGNOSIS — Z20822 Contact with and (suspected) exposure to covid-19: Secondary | ICD-10-CM | POA: Diagnosis present

## 2019-12-02 DIAGNOSIS — M810 Age-related osteoporosis without current pathological fracture: Secondary | ICD-10-CM | POA: Diagnosis present

## 2019-12-02 DIAGNOSIS — N183 Chronic kidney disease, stage 3 unspecified: Secondary | ICD-10-CM | POA: Diagnosis present

## 2019-12-02 DIAGNOSIS — I130 Hypertensive heart and chronic kidney disease with heart failure and stage 1 through stage 4 chronic kidney disease, or unspecified chronic kidney disease: Secondary | ICD-10-CM | POA: Diagnosis present

## 2019-12-02 DIAGNOSIS — M25572 Pain in left ankle and joints of left foot: Secondary | ICD-10-CM | POA: Diagnosis not present

## 2019-12-02 DIAGNOSIS — G8929 Other chronic pain: Secondary | ICD-10-CM | POA: Diagnosis not present

## 2019-12-02 DIAGNOSIS — E1122 Type 2 diabetes mellitus with diabetic chronic kidney disease: Secondary | ICD-10-CM | POA: Diagnosis present

## 2019-12-02 DIAGNOSIS — E785 Hyperlipidemia, unspecified: Secondary | ICD-10-CM | POA: Diagnosis present

## 2019-12-02 DIAGNOSIS — R109 Unspecified abdominal pain: Secondary | ICD-10-CM

## 2019-12-02 DIAGNOSIS — G894 Chronic pain syndrome: Secondary | ICD-10-CM | POA: Diagnosis present

## 2019-12-02 DIAGNOSIS — E1169 Type 2 diabetes mellitus with other specified complication: Secondary | ICD-10-CM

## 2019-12-02 DIAGNOSIS — F419 Anxiety disorder, unspecified: Secondary | ICD-10-CM | POA: Diagnosis present

## 2019-12-02 DIAGNOSIS — I509 Heart failure, unspecified: Secondary | ICD-10-CM | POA: Diagnosis present

## 2019-12-02 DIAGNOSIS — F410 Panic disorder [episodic paroxysmal anxiety] without agoraphobia: Secondary | ICD-10-CM | POA: Diagnosis present

## 2019-12-02 DIAGNOSIS — G4730 Sleep apnea, unspecified: Secondary | ICD-10-CM | POA: Diagnosis present

## 2019-12-02 DIAGNOSIS — N179 Acute kidney failure, unspecified: Secondary | ICD-10-CM | POA: Diagnosis present

## 2019-12-02 DIAGNOSIS — Z881 Allergy status to other antibiotic agents status: Secondary | ICD-10-CM

## 2019-12-02 DIAGNOSIS — F4310 Post-traumatic stress disorder, unspecified: Secondary | ICD-10-CM | POA: Diagnosis present

## 2019-12-02 DIAGNOSIS — J439 Emphysema, unspecified: Secondary | ICD-10-CM | POA: Diagnosis present

## 2019-12-02 DIAGNOSIS — M797 Fibromyalgia: Secondary | ICD-10-CM | POA: Diagnosis present

## 2019-12-02 DIAGNOSIS — K660 Peritoneal adhesions (postprocedural) (postinfection): Secondary | ICD-10-CM | POA: Diagnosis present

## 2019-12-02 DIAGNOSIS — E876 Hypokalemia: Secondary | ICD-10-CM | POA: Diagnosis present

## 2019-12-02 DIAGNOSIS — I10 Essential (primary) hypertension: Secondary | ICD-10-CM | POA: Diagnosis present

## 2019-12-02 DIAGNOSIS — Z9151 Personal history of suicidal behavior: Secondary | ICD-10-CM

## 2019-12-02 DIAGNOSIS — Z8673 Personal history of transient ischemic attack (TIA), and cerebral infarction without residual deficits: Secondary | ICD-10-CM

## 2019-12-02 DIAGNOSIS — Z79890 Hormone replacement therapy (postmenopausal): Secondary | ICD-10-CM

## 2019-12-02 DIAGNOSIS — G2581 Restless legs syndrome: Secondary | ICD-10-CM | POA: Diagnosis present

## 2019-12-02 DIAGNOSIS — Z8262 Family history of osteoporosis: Secondary | ICD-10-CM

## 2019-12-02 DIAGNOSIS — Z8249 Family history of ischemic heart disease and other diseases of the circulatory system: Secondary | ICD-10-CM

## 2019-12-02 DIAGNOSIS — M1990 Unspecified osteoarthritis, unspecified site: Secondary | ICD-10-CM | POA: Diagnosis present

## 2019-12-02 DIAGNOSIS — Z833 Family history of diabetes mellitus: Secondary | ICD-10-CM

## 2019-12-02 DIAGNOSIS — Z79899 Other long term (current) drug therapy: Secondary | ICD-10-CM

## 2019-12-02 DIAGNOSIS — E039 Hypothyroidism, unspecified: Secondary | ICD-10-CM | POA: Diagnosis present

## 2019-12-02 DIAGNOSIS — K219 Gastro-esophageal reflux disease without esophagitis: Secondary | ICD-10-CM | POA: Diagnosis present

## 2019-12-02 DIAGNOSIS — K56609 Unspecified intestinal obstruction, unspecified as to partial versus complete obstruction: Principal | ICD-10-CM | POA: Diagnosis present

## 2019-12-02 DIAGNOSIS — M5136 Other intervertebral disc degeneration, lumbar region: Secondary | ICD-10-CM | POA: Diagnosis present

## 2019-12-02 DIAGNOSIS — Z4659 Encounter for fitting and adjustment of other gastrointestinal appliance and device: Secondary | ICD-10-CM

## 2019-12-02 DIAGNOSIS — M79605 Pain in left leg: Secondary | ICD-10-CM | POA: Diagnosis not present

## 2019-12-02 DIAGNOSIS — M545 Low back pain, unspecified: Secondary | ICD-10-CM | POA: Diagnosis not present

## 2019-12-02 DIAGNOSIS — E119 Type 2 diabetes mellitus without complications: Secondary | ICD-10-CM

## 2019-12-02 DIAGNOSIS — Z6841 Body Mass Index (BMI) 40.0 and over, adult: Secondary | ICD-10-CM

## 2019-12-02 DIAGNOSIS — Z818 Family history of other mental and behavioral disorders: Secondary | ICD-10-CM

## 2019-12-02 DIAGNOSIS — Z87891 Personal history of nicotine dependence: Secondary | ICD-10-CM

## 2019-12-02 DIAGNOSIS — I252 Old myocardial infarction: Secondary | ICD-10-CM

## 2019-12-02 DIAGNOSIS — I2510 Atherosclerotic heart disease of native coronary artery without angina pectoris: Secondary | ICD-10-CM | POA: Diagnosis present

## 2019-12-02 DIAGNOSIS — Z7984 Long term (current) use of oral hypoglycemic drugs: Secondary | ICD-10-CM

## 2019-12-02 LAB — BASIC METABOLIC PANEL
Anion gap: 16 — ABNORMAL HIGH (ref 5–15)
BUN: 21 mg/dL — ABNORMAL HIGH (ref 6–20)
CO2: 21 mmol/L — ABNORMAL LOW (ref 22–32)
Calcium: 9.1 mg/dL (ref 8.9–10.3)
Chloride: 99 mmol/L (ref 98–111)
Creatinine, Ser: 1.24 mg/dL — ABNORMAL HIGH (ref 0.44–1.00)
GFR, Estimated: 52 mL/min — ABNORMAL LOW (ref >60)
Glucose, Bld: 179 mg/dL — ABNORMAL HIGH (ref 70–99)
Potassium: 3.1 mmol/L — ABNORMAL LOW (ref 3.5–5.1)
Sodium: 136 mmol/L (ref 135–145)

## 2019-12-02 LAB — RESPIRATORY PANEL BY RT PCR (FLU A&B, COVID)
Influenza A by PCR: NEGATIVE
Influenza B by PCR: NEGATIVE
SARS Coronavirus 2 by RT PCR: NEGATIVE

## 2019-12-02 LAB — CBC
HCT: 39.9 % (ref 36.0–46.0)
Hemoglobin: 13.4 g/dL (ref 12.0–15.0)
MCH: 29.1 pg (ref 26.0–34.0)
MCHC: 33.6 g/dL (ref 30.0–36.0)
MCV: 86.6 fL (ref 80.0–100.0)
Platelets: 300 10*3/uL (ref 150–400)
RBC: 4.61 MIL/uL (ref 3.87–5.11)
RDW: 14.7 % (ref 11.5–15.5)
WBC: 5.9 10*3/uL (ref 4.0–10.5)
nRBC: 0 % (ref 0.0–0.2)

## 2019-12-02 LAB — LACTIC ACID, PLASMA: Lactic Acid, Venous: 1.8 mmol/L (ref 0.5–1.9)

## 2019-12-02 LAB — TROPONIN I (HIGH SENSITIVITY)
Troponin I (High Sensitivity): 3 ng/L (ref <18)
Troponin I (High Sensitivity): 3 ng/L (ref <18)

## 2019-12-02 LAB — GLUCOSE, CAPILLARY
Glucose-Capillary: 146 mg/dL — ABNORMAL HIGH (ref 70–99)
Glucose-Capillary: 146 mg/dL — ABNORMAL HIGH (ref 70–99)

## 2019-12-02 LAB — APTT: aPTT: 30 seconds (ref 24–36)

## 2019-12-02 MED ORDER — INSULIN ASPART 100 UNIT/ML ~~LOC~~ SOLN
0.0000 [IU] | SUBCUTANEOUS | Status: DC
  Administered 2019-12-02 – 2019-12-03 (×3): 2 [IU] via SUBCUTANEOUS
  Administered 2019-12-03: 3 [IU] via SUBCUTANEOUS
  Administered 2019-12-03 (×2): 2 [IU] via SUBCUTANEOUS
  Administered 2019-12-03: 3 [IU] via SUBCUTANEOUS
  Administered 2019-12-04 – 2019-12-05 (×7): 2 [IU] via SUBCUTANEOUS
  Administered 2019-12-05 – 2019-12-08 (×3): 3 [IU] via SUBCUTANEOUS
  Administered 2019-12-08: 2 [IU] via SUBCUTANEOUS
  Administered 2019-12-09: 3 [IU] via SUBCUTANEOUS
  Filled 2019-12-02 (×18): qty 1

## 2019-12-02 MED ORDER — MORPHINE SULFATE (PF) 2 MG/ML IV SOLN
2.0000 mg | INTRAVENOUS | Status: DC | PRN
  Administered 2019-12-02 – 2019-12-04 (×9): 2 mg via INTRAVENOUS
  Filled 2019-12-02 (×9): qty 1

## 2019-12-02 MED ORDER — IOHEXOL 300 MG/ML  SOLN
100.0000 mL | Freq: Once | INTRAMUSCULAR | Status: AC | PRN
  Administered 2019-12-02: 100 mL via INTRAVENOUS
  Filled 2019-12-02: qty 100

## 2019-12-02 MED ORDER — ENOXAPARIN SODIUM 60 MG/0.6ML ~~LOC~~ SOLN
50.0000 mg | SUBCUTANEOUS | Status: DC
  Filled 2019-12-02: qty 0.6

## 2019-12-02 MED ORDER — SODIUM CHLORIDE 0.9 % IV SOLN
1000.0000 mL | Freq: Once | INTRAVENOUS | Status: AC
  Administered 2019-12-02: 1000 mL via INTRAVENOUS

## 2019-12-02 MED ORDER — BENZOCAINE 20 % MT AERO
INHALATION_SPRAY | Freq: Once | OROMUCOSAL | Status: AC
  Administered 2019-12-02: 1 via OROMUCOSAL
  Filled 2019-12-02: qty 57

## 2019-12-02 MED ORDER — MORPHINE SULFATE (PF) 4 MG/ML IV SOLN
4.0000 mg | Freq: Once | INTRAVENOUS | Status: AC
  Administered 2019-12-02: 4 mg via INTRAVENOUS
  Filled 2019-12-02: qty 1

## 2019-12-02 MED ORDER — ONDANSETRON 4 MG PO TBDP
4.0000 mg | ORAL_TABLET | Freq: Once | ORAL | Status: AC | PRN
  Administered 2019-12-02: 4 mg via ORAL
  Filled 2019-12-02: qty 1

## 2019-12-02 MED ORDER — SODIUM CHLORIDE 0.9 % IV SOLN: INTRAVENOUS | Status: DC

## 2019-12-02 MED ORDER — ONDANSETRON HCL 4 MG PO TABS: 4.0000 mg | ORAL_TABLET | Freq: Four times a day (QID) | ORAL | Status: DC | PRN

## 2019-12-02 MED ORDER — ONDANSETRON HCL 4 MG/2ML IJ SOLN
4.0000 mg | Freq: Four times a day (QID) | INTRAMUSCULAR | Status: DC | PRN
  Administered 2019-12-03 – 2019-12-08 (×12): 4 mg via INTRAVENOUS
  Filled 2019-12-02 (×12): qty 2

## 2019-12-02 MED ORDER — ACETAMINOPHEN 325 MG PO TABS
650.0000 mg | ORAL_TABLET | Freq: Once | ORAL | Status: AC
  Administered 2019-12-02: 650 mg via ORAL
  Filled 2019-12-02: qty 2

## 2019-12-02 MED ORDER — PROMETHAZINE HCL 25 MG/ML IJ SOLN
12.5000 mg | Freq: Once | INTRAMUSCULAR | Status: AC
  Administered 2019-12-02: 12.5 mg via INTRAVENOUS
  Filled 2019-12-02: qty 1

## 2019-12-02 MED ORDER — ONDANSETRON HCL 4 MG/2ML IJ SOLN
4.0000 mg | Freq: Once | INTRAMUSCULAR | Status: AC
  Administered 2019-12-02: 4 mg via INTRAVENOUS
  Filled 2019-12-02: qty 2

## 2019-12-02 NOTE — ED Provider Notes (Signed)
[facility] Emergency Department Provider Note   ____________________________________________    I have reviewed the triage vital signs and the nursing notes.   HISTORY  Chief Complaint Tachycardia     HPI Marie Clarke is a 49 y.o. female with extensive past medical history as detailed below who presents with multiple complaints.  Patient reports she had her Covid booster on Monday and since then has been feeling fatigued with cough and mild shortness of breath.  She also feels nauseated at times.  She does not know if she has had fevers.  Has not take anything for this.  Reports normal stools.  Denies chest pain.  Past Medical History:  Diagnosis Date  . Anemia   . Anginal pain ([place])   . Anxiety   . Arthritis   . Asthma   . Bilateral lower extremity edema   . Bipolar disorder ([place])   . CAD (coronary artery disease) unk  . CHF (congestive heart failure) ([place])   . Chronic pain syndrome   . COPD (chronic obstructive pulmonary disease) (HCC)    emphysema  . DDD (degenerative disc disease), lumbar   . Depression unk  . Diabetes mellitus without complication ([place])   . Diabetes mellitus, type II ([place])   . Drug overdose 2013-05-06   after dad died  . Dysrhythmia    history of tachy arrythmias  . Fibromyalgia   . GERD (gastroesophageal reflux disease)   . Headache    chronic migraines  . Hyperlipidemia   . Hypertension   . Hypothyroidism   . Left leg pain 04/29/2014  . MI (myocardial infarction) ([place]) 2005/05/06  . Muscle ache 09/16/2014  . Osteoporosis   . Overactive bladder   . Pancreatitis unk  . Panic attacks   . PTSD (post-traumatic stress disorder)   . Reflex sympathetic dystrophy   . Renal cyst, left   . Renal insufficiency    ckd stage iii  . Restless legs syndrome   . Sleep apnea 06/2019   uses cpap  . Sleep apnea   . Stroke ([place]) 05-06-08   TIA x 2. no residual deficits  . Suicide attempt [facility])    after father died.  . Thyroid disease     thyroid nodule  . TIA (transient ischemic attack) unk  . TIA (transient ischemic attack)     Patient Active Problem List   Diagnosis Date Noted  . Presence of neurostimulator (SCS) (June 2021) 10/06/2019  . Pain due to any device, implant or graft 09/21/2019  . Anxiety 09/10/2019  . Involuntary movements 09/10/2019  . Abnormal involuntary movement 08/18/2019  . Bipolar disorder, in full remission, most recent episode mixed ([place]) 08/11/2019  . Lumbosacral radiculopathy at L5 (Right) 04/09/2019  . Chronic migraine 03/12/2019  . Gout 01/21/2019  . Noncompliance with treatment regimen 12/29/2018  . BMI 40.0-44.9, adult ([place]) 11/19/2018  . AKI (acute kidney injury) ([place]) 11/17/2018  . Postural dizziness with presyncope 09/29/2018  . Bipolar I disorder, most recent episode mixed ([place]) 09/10/2018  . Panic attacks 09/10/2018  . Chronic lumbosacral L5-S1 IVD protrusion (Bilateral) 08/25/2018  . Lumbar lateral recess stenosis (L5-S1) (Bilateral) 08/25/2018  . Wound disruption 08/05/2018  . Osteoarthritis involving multiple joints 07/10/2018  . Long term current use of non-steroidal anti-inflammatories (NSAID) 07/10/2018  . NSAID induced gastritis 07/10/2018  . DDD (degenerative disc disease), lumbosacral 04/22/2018  . Disease related peripheral neuropathy 11/13/2017  . Gastroesophageal reflux disease without esophagitis 11/13/2017  . Occipital headache (Bilateral) 11/13/2017  .  Cervicogenic headache (Bilateral) (L>R) 11/13/2017  . History of postoperative nausea 10/22/2017  . Chronic shoulder pain (Fifth Area of Pain) (Bilateral) (L>R) 10/09/2017  . Ankle joint instability (Left) 10/09/2017  . Ankle sprain, sequela (Left) 10/09/2017  . History of psychiatric symptoms 10/09/2017  . Chronic pain syndrome 10/02/2017  . Spondylosis without myelopathy or radiculopathy, lumbosacral region 10/02/2017  . Chronic musculoskeletal pain 10/02/2017  . Elevated C-reactive protein (CRP) 09/10/2017   . Elevated sed rate 09/10/2017  . Chronic neck pain (Fourth Area of Pain) (Bilateral) (L>R) 09/09/2017  . Pharmacologic therapy 09/09/2017  . Disorder of skeletal system 09/09/2017  . Problems influencing health status 09/09/2017  . Long term current use of opiate analgesic 09/09/2017  . Tobacco use disorder 07/16/2017  . Ventral hernia without obstruction or gangrene 06/25/2017  . Strain of extensor muscle, fascia and tendon of left index finger at wrist and hand level, initial encounter 05/16/2017  . Sepsis ([place]) 04/14/2017  . Osteopenia 04/03/2017  . Hypotension 09/17/2016  . Contusion of knee (Left) 10/12/2015  . Strain of knee (Left) 10/12/2015  . Incidental lung nodule 04/28/2015  . Chronic low back pain (Primary Area of Pain) (Bilateral) (L>R) 03/28/2015  . Chronic lower extremity pain (Referred) (Secondary Area of Pain) (Left) 03/28/2015  . Abdominal wound dehiscence 03/28/2015  . Encounter for pain management planning 03/28/2015  . Morbid obesity ([place]) 03/28/2015  . Abnormal CT scan, lumbar spine 03/28/2015  . Lumbar facet hypertrophy 03/28/2015  . Lumbar facet syndrome (Bilateral) (L>R) 03/28/2015  . Lumbar foraminal stenosis (Bilateral) (L5-S1) 03/28/2015  . Chronic ankle pain [facility] Area of Pain) (Left) 03/28/2015  . Neurogenic pain 03/28/2015  . Neuropathic pain 03/28/2015  . Myofascial pain 03/28/2015  . History of suicide attempt 03/28/2015  . PTSD (post-traumatic stress disorder) 01/13/2015  . Abnormal gait 12/15/2014  . Congestive heart failure ([place]) 11/15/2014  . Abdominal wall abscess 09/20/2014  . 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 Diabetes mellitus, type 2 ([place]) 08/13/2014  . Bipolar affective disorder ([place]) 08/13/2014  . Type 2 diabetes mellitus ([place]) 08/13/2014  . Rectal prolapse 08/09/2014  . Rectal bleeding 08/09/2014  . Rectal bleed 08/09/2014  . Affective bipolar disorder ([place]) 08/05/2014  . Arteriosclerosis of coronary artery 08/05/2014  .  CCF (congestive cardiac failure) ([place]) 08/05/2014  . Chronic kidney disease 08/05/2014  . 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 Apnea, sleep 08/05/2014  . Temporary cerebral vascular dysfunction 08/05/2014  . Polypharmacy 04/29/2014  . Other long term (current) drug therapy 04/29/2014  . 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 Chronic kidney disease, stage III (moderate) ([place]) 12/14/2013  . Controlled diabetes mellitus type II without complication ([place]) 93/79/0240  . Essential (primary) hypertension 12/03/2013  . Adult hypothyroidism 12/03/2013  . Controlled type 2 diabetes mellitus without complication ([place]) 97/35/3299    Past Surgical History:  Procedure Laterality Date  . ABDOMINAL HYSTERECTOMY    . CARDIAC CATHETERIZATION    . HERNIA REPAIR  07/15/2017   [facility]  . lumbar facet     several  . prolapse rectum surgery N/A July 2016  . THORACIC LAMINECTOMY FOR SPINAL CORD STIMULATOR N/A 07/27/2019   Procedure: THORACIC SPINAL CORD STIMULATOR PLACEMENT WITH RIGHT FLANK PULSE GENERATOR;  Surgeon: [name], MD;  Location: [facility] ORS;  Service: Neurosurgery;  Laterality: N/A;  . TONSILLECTOMY      Prior to Admission medications   Medication Sig Start Date End Date Taking? Authorizing Provider  atorvastatin (LIPITOR) 40 MG tablet Take 40 mg by mouth at bedtime.  [provider]  B Complex-C (B-COMPLEX WITH VITAMIN C) tablet Take by mouth.    [provider]  benztropine (COGENTIN) 0.5 MG tablet Take 1 tablet (0.5 mg total) by mouth 2 (two) times daily. Abnormal movements 10/14/19   Ursula Alert, MD  busPIRone (BUSPAR) 15 MG tablet Take 1 tablet (15 mg total) by mouth 2 (two) times daily. 10/14/19   Ursula Alert, MD  Cholecalciferol (VITAMIN D3) 125 MCG (5000 UT) TABS Take by mouth.    [provider]  Continuous Blood Gluc Receiver (DEXCOM G6 RECEIVER) DEVI 1 device use as directed annually 10/09/19   [provider]  Continuous Blood Gluc Sensor  (DEXCOM G6 SENSOR) MISC Use 1 each every 30 (thirty) days 10/09/19   [provider]  Continuous Blood Gluc Transmit (DEXCOM G6 TRANSMITTER) MISC Use 1 Device every 3 (three) months 10/09/19   [provider]  darifenacin (ENABLEX) 7.5 MG 24 hr tablet Take 7.5 mg by mouth at bedtime. Use as needed for OAB 03/18/19 03/17/20  [provider]  desvenlafaxine (PRISTIQ) 50 MG 24 hr tablet Take 1 tablet (50 mg total) by mouth at bedtime. 10/14/19   Ursula Alert, MD  dicyclomine (BENTYL) 10 MG capsule 3 (three) times daily.     [provider]  Dulaglutide 0.75 MG/0.5ML SOPN Inject 0.75 mg into the skin every Saturday.  Patient not taking: Reported on 11/25/2019 07/15/18   [provider]  famotidine (PEPCID) 20 MG tablet Take 40 mg by mouth daily.  04/01/19   [provider]  fluticasone (FLONASE) 50 MCG/ACT nasal spray Place into the nose. 06/03/19   [provider]  gabapentin (NEURONTIN) 600 MG tablet Take 1 tablet (600 mg total) by mouth in the morning, at noon, in the evening, and at bedtime. 11/17/19 12/17/19  Milinda Pointer, MD  glipiZIDE (GLUCOTROL XL) 10 MG 24 hr tablet Take by mouth. 11/17/19 11/16/20  [provider]  glucose blood test strip OneTouch Verio test strips    [provider]  hydrOXYzine (ATARAX/VISTARIL) 25 MG tablet TAKE 1 TABLET BY MOUTH EVERY 6 HOURS AS NEEDED 11/09/19   Ursula Alert, MD  KLOR-CON M20 20 MEQ tablet Take 0.5 tablets (10 mEq total) by mouth 2 (two) times daily. Patient taking differently: Take 20 mEq by mouth 2 (two) times daily.  11/18/18   Mayo, Pete Pelt, MD  Lancets (ACCU-CHEK SAFE-T PRO) lancets  11/21/18   [provider]  levocetirizine (XYZAL) 5 MG tablet Take 5 mg by mouth daily. Takes at night 06/04/18   [provider]  levothyroxine (SYNTHROID) 175 MCG tablet  11/10/19   [provider]  levothyroxine (SYNTHROID) 25 MCG tablet  10/16/19   [provider]  levothyroxine (SYNTHROID, LEVOTHROID) 200 MCG tablet Take 200 mcg by mouth daily. (222mcg total daily) Patient not taking: Reported on 11/25/2019    [provider]  metoprolol succinate (TOPROL-XL) 25 MG 24 hr tablet Take 25 mg by mouth daily. Takes in the evening 02/27/19   [provider]  montelukast (SINGULAIR) 10 MG tablet Take 10 mg by mouth daily. Takes in the afternoon    [provider]  nitroGLYCERIN (NITROSTAT) 0.4 MG SL tablet Place 0.4 mg under the tongue every 5 (five) minutes as needed for chest pain.     [provider]  omeprazole (PRILOSEC) 40 MG capsule  11/16/19   [provider]  ondansetron (ZOFRAN-ODT) 8 MG disintegrating tablet  11/12/19   [provider]  promethazine (PHENERGAN) 25 MG tablet  11/12/19   [provider]  QUEtiapine (SEROQUEL) 100 MG tablet Take 1 tablet (100 mg total) by mouth at bedtime. To be combined with 25 mg at bedtime 11/25/19   Ursula Alert, MD  QUEtiapine (SEROQUEL) 25 MG tablet Take 1 tablet (25 mg total) by mouth as directed. Start taking 1 tablet  with 100 mg at bedtime and 1 tablet daily as needed for agitation only 11/25/19   Ursula Alert, MD  rizatriptan (MAXALT) 10 MG tablet Take 1 tablet (10 mg total) by mouth as needed. 01/21/19 11/25/19  Milinda Pointer, MD  sucralfate (CARAFATE) 1 g tablet  11/16/19   [provider]  topiramate (TOPAMAX) 50 MG tablet Take 50 mg by mouth 2 (two) times daily.    [provider]  torsemide (DEMADEX) 20 MG tablet Take 1 tablet (20 mg total) by mouth 2 (two) times daily. 11/18/18 09/08/19  Mayo, Pete Pelt, MD  TRELEGY ELLIPTA 100-62.5-25 MCG/INH AEPB Inhale 1 puff into the lungs daily. Takes at night    [provider]  VENTOLIN HFA 108 (90 Base) MCG/ACT inhaler Inhale 1-2 puffs into the lungs every 6 (six) hours as needed for wheezing or shortness of breath.  05/03/18   [provider]  vitamin  E 400 UNIT capsule 400 Units daily.     [provider]  methocarbamol (ROBAXIN) 750 MG tablet Take 1 tablet (750 mg total) by mouth every 8 (eight) hours as needed for muscle spasms. 09/09/19 10/25/19  Milinda Pointer, MD     Allergies Diazepam, Ziprasidone hcl, Azithromycin, Divalproex sodium, Levofloxacin, Lisinopril, Metronidazole, Sulfa antibiotics, Valproic acid, Cephalexin, Ciprofloxacin, and Penicillins  Family History  Problem Relation Age of Onset  . Diabetes Mellitus II Mother   . CAD Mother   . Sleep apnea Mother   . Osteoarthritis Mother   . Osteoporosis Mother   . Anxiety disorder Mother   . Depression Mother   . Bipolar disorder Mother   . Bipolar disorder Father   . Hypertension Father   . Depression Father   . Anxiety disorder Father   . Post-traumatic stress disorder Sister     Social History Social History   Tobacco Use  . Smoking status: Former Smoker    Packs/day: 0.50    Types: Cigarettes    Quit date: 09/01/2018    Years since quitting: 1.2  . Smokeless tobacco: Never Used  . Tobacco comment: Patient quit smoking 09/01/2018  Vaping Use  . Vaping Use: Never used  Substance Use Topics  . Alcohol use: No    Alcohol/week: 0.0 standard drinks  . Drug use: No    Review of Systems  Constitutional: No fever/chills Eyes: No visual changes.  ENT: No sore throat. Cardiovascular: Denies chest pain. Respiratory: Cough Gastrointestinal: Nausea, no vomiting Genitourinary: Negative for dysuria. Musculoskeletal: Negative for back pain. Skin: Negative for rash. Neurological: Negative for headaches   ____________________________________________   PHYSICAL EXAM:  VITAL SIGNS: ED Triage Vitals  Enc Vitals Group     BP 12/02/19 0850 139/88     Pulse Rate 12/02/19 0850 (!) 142     Resp 12/02/19 0850 (!) 22     Temp 12/02/19 0850 (!) 101.4 F (38.6 C)     Temp Source 12/02/19 0850 Oral     SpO2 12/02/19 0850 95 %     Weight 12/02/19 0850  102.1 kg (225 lb)     Height 12/02/19 0850 1.626 m (5\' 4" )  Head Circumference --      Peak Flow --      Pain Score 12/02/19 0900 10     Pain Loc --      Pain Edu? --      Excl. in [place]? --     Constitutional: Alert and oriented.   Nose: No congestion/rhinnorhea. Mouth/Throat: Mucous membranes are moist.   Neck:  Painless ROM Cardiovascular: Tachycardia, regular rhythm. Grossly normal heart sounds.  Good peripheral circulation. Respiratory: Normal respiratory effort.  No retractions. Lungs CTAB. Gastrointestinal: Mild distention, overall soft  Musculoskeletal:   Warm and well perfused Neurologic:  Normal speech and language. No gross focal neurologic deficits are appreciated.  Skin:  Skin is warm, dry and intact. No rash noted. Psychiatric: Mood and affect are normal. Speech and behavior are normal.  ____________________________________________   LABS (all labs ordered are listed, but only abnormal results are displayed)  Labs Reviewed  BASIC METABOLIC PANEL - Abnormal; Notable for the following components:      Result Value   Potassium 3.1 (*)    CO2 21 (*)    Glucose, Bld 179 (*)    BUN 21 (*)    Creatinine, Ser 1.24 (*)    GFR, Estimated 52 (*)    Anion gap 16 (*)    All other components within normal limits  RESPIRATORY PANEL BY RT PCR (FLU A&B, COVID)  CULTURE, BLOOD (SINGLE)  CBC  LACTIC ACID, PLASMA  APTT  LACTIC ACID, PLASMA  URINALYSIS, COMPLETE (UACMP) WITH MICROSCOPIC  TROPONIN I (HIGH SENSITIVITY)  TROPONIN I (HIGH SENSITIVITY)   ____________________________________________  EKG  ED ECG REPORT I, Lavonia Drafts, the attending physician, personally viewed and interpreted this ECG.  Date: 12/02/2019  Rate: 144 Rhythm: Sinus tachycardia QRS Axis: normal Intervals: normal ST/T Wave abnormalities: normal Narrative Interpretation: no evidence of acute ischemia  ____________________________________________  RADIOLOGY  Chest x-ray viewed by  me, possible lingular infiltrate, CT chest reviewed by me, no evidence of pneumonia, on radiology review dilated loops of bowel noted, contacted by Dr. Liborio Nixon of radiology ____________________________________________   PROCEDURES  Procedure(s) performed: No  Procedures   Critical Care performed: No ____________________________________________   INITIAL IMPRESSION / ASSESSMENT AND PLAN / ED COURSE  Pertinent labs & imaging results that were available during my care of the patient were reviewed by me and considered in my medical decision making (see chart for details).  Patient presents with primary complaint of cough, fatigue, nausea after receiving her Covid booster 2 days ago.  She reports several people in her church have had Covid.    Differential includes COVID-19, CAP, other viral illness  We will send for CT chest given possible infiltrate on chest x-ray, lab work is overall reassuring, normal white blood cell count.  She does have mild elevation of BUN to creatinine ratio, will give IV fluids.  Troponin is normal x2  Lactic acid is normal.  Contacted by Dr. Golden Circle of radiology about dilated loops of bowel noted on CT chest, will send for CT abdomen pelvis.  On reevaluation patient is increasingly tender in the abdomen and more distended   Sent for CT which demonstrates SBO with transition point. D/W Dr. Luther Bradley of surgery who recommends NGT, will consult. Will consult medicine for admission       ____________________________________________   FINAL CLINICAL IMPRESSION(S) / ED DIAGNOSES  Final diagnoses:  Small bowel obstruction ([place])        Note:  This document was prepared using Dragon voice recognition  software and may include unintentional dictation errors.   Lavonia Drafts, MD 12/02/19 [phone]

## 2019-12-02 NOTE — Progress Notes (Signed)
PHARMACIST - PHYSICIAN COMMUNICATION  CONCERNING:  Enoxaparin (Lovenox) for DVT Prophylaxis    RECOMMENDATION: Patient was prescribed enoxaprin 40mg  q24 hours for VTE prophylaxis.   Filed Weights   12/02/19 0850  Weight: 102.1 kg (225 lb)    Body mass index is 38.62 kg/m.  Estimated Creatinine Clearance: 65.3 mL/min (A) (by C-G formula based on SCr of 1.24 mg/dL (H)).   Based on [place] patient is candidate for enoxaparin 0.5mg /kg TBW SQ every 24 hours based on BMI being >30.  DESCRIPTION: Pharmacy has adjusted enoxaparin dose per [facility] policy.  Patient is now receiving enoxaparin 50 mg every 24 hours    Odai Wimmer, PharmD Clinical Pharmacist  12/02/2019 7:07 PM

## 2019-12-02 NOTE — ED Notes (Signed)
Pt ambulated to bedside commode at this time with minimal assistance. Will continue to monitor.

## 2019-12-02 NOTE — ED Triage Notes (Signed)
Pt presents to ED via POV, pt states had 3rd pfizer covid shot on Monday. Pt presents with c/o tachycardia, fever, emesis/diarrhea. Pt states "feels like my whole insides from here (points to abdomen) down are coming out".

## 2019-12-02 NOTE — H&P (Signed)
History and Physical    Marie Clarke HWE:[id number] DOB: 1970-05-16 DOA: 12/02/2019  PCP: Sharyne Peach, MD   Patient coming from: Home  I have personally briefly reviewed patient's old medical records in [place]  Chief Complaint: Vomiting, abdominal pain x1 day  HPI: Marie Clarke is a 49 y.o. female with medical history significant for diabetes, hypertension, hypothyroidism, CKD 3, with history of several abdominal surgeries, who presents with a 1 day history of of abdominal pain, fever, and several episodes of bilious vomiting .  She denies cough or shortness of breath.  Denies chest pain.  Abdominal pain is colicky, periumbilical and nonradiating of moderate intensity.  ED Course: On arrival in the emergency room she had a low-grade temperature of 99.5 and was tachycardic at 134.  BP 110/80.  On her blood work WBC 5.9 with lactic acid 1.8.  Creatinine 1.24, above baseline of 1.05.  Troponin 3x2.  Covid and flu tests negative EKG as interpreted by me, shows sinus tachycardia with a rate of 144.  CT abdomen and pelvis showed small bowel obstruction with point of transition in the region of previously noted spigelian hernia suggesting an adhesion as underlying cause.  The emergency room provider spoke with surgeon Dr. Milas Gain who will see patient in consultation.  NG tube placed.  Hospitalist consulted for admission.   Review of Systems: As per HPI otherwise all other systems on review of systems negative.    Past Medical History:  Diagnosis Date  . Anemia   . Anginal pain ([place])   . Anxiety   . Arthritis   . Asthma   . Bilateral lower extremity edema   . Bipolar disorder ([place])   . CAD (coronary artery disease) unk  . CHF (congestive heart failure) ([place])   . Chronic pain syndrome   . COPD (chronic obstructive pulmonary disease) (H[facility])    emphysema  . DDD (degenerative disc disease), lumbar   . Depression unk  . Diabetes mellitus without complication ([place])   . Diabetes  mellitus, type II ([place])   . Drug overdose 2013-05-13   after dad died  . Dysrhythmia    history of tachy arrythmias  . Fibromyalgia   . GERD (gastroesophageal reflux disease)   . Headache    chronic migraines  . Hyperlipidemia   . Hypertension   . Hypothyroidism   . Left leg pain 04/29/2014  . MI (myocardial infarction) ([place]) 2005-05-13  . Muscle ache 09/16/2014  . Osteoporosis   . Overactive bladder   . Pancreatitis unk  . Panic attacks   . PTSD (post-traumatic stress disorder)   . Reflex sympathetic dystrophy   . Renal cyst, left   . Renal insufficiency    ckd stage iii  . Restless legs syndrome   . Sleep apnea 06/2019   uses cpap  . Sleep apnea   . Stroke ([place]) 05/13/2008   TIA x 2. no residual deficits  . Suicide attempt [facility])    after father died.  . Thyroid disease    thyroid nodule  . TIA (transient ischemic attack) unk  . TIA (transient ischemic attack)     Past Surgical History:  Procedure Laterality Date  . ABDOMINAL HYSTERECTOMY    . CARDIAC CATHETERIZATION    . HERNIA REPAIR  07/15/2017   [facility]  . lumbar facet     several  . prolapse rectum surgery N/A July 2016  . THORACIC LAMINECTOMY FOR SPINAL CORD STIMULATOR N/A 07/27/2019   Procedure: THORACIC SPINAL CORD  STIMULATOR PLACEMENT WITH RIGHT FLANK PULSE GENERATOR;  Surgeon: [name], MD;  Location: [facility] ORS;  Service: Neurosurgery;  Laterality: N/A;  . TONSILLECTOMY       reports that she quit smoking about 15 months ago. Her smoking use included cigarettes. She smoked 0.50 packs per day. She has never used smokeless tobacco. She reports that she does not drink alcohol and does not use drugs.  Allergies  Allergen Reactions  . Diazepam Hives and Nausea And Vomiting  . Ziprasidone Hcl Other (See Comments) and Itching    CONFUSED CONFUSED Other reaction(s): Other (See Comments) CONFUSED CONFUSED   . Azithromycin Hives  . Divalproex Sodium Itching  . Levofloxacin Hives and Rash    1/31: would like to retry since  not taking valium  . Lisinopril     Medicine cause kidney failure  . Metronidazole Hives    1/31: would like to retry since she is no longer taking valium  . Sulfa Antibiotics Hives  . Valproic Acid Itching and Rash  . Cephalexin Hives  . Ciprofloxacin Hives  . Penicillins Nausea And Vomiting    Has patient had a PCN reaction causing immediate rash, facial/tongue/throat swelling, SOB or lightheadedness with hypotension: No Has patient had a PCN reaction causing severe rash involving mucus membranes or skin necrosis: No Has patient had a PCN reaction that required hospitalization: No Has patient had a PCN reaction occurring within the last 10 years: No If all of the above answers are "NO", then may proceed with Cephalosporin use.     Family History  Problem Relation Age of Onset  . Diabetes Mellitus II Mother   . CAD Mother   . Sleep apnea Mother   . Osteoarthritis Mother   . Osteoporosis Mother   . Anxiety disorder Mother   . Depression Mother   . Bipolar disorder Mother   . Bipolar disorder Father   . Hypertension Father   . Depression Father   . Anxiety disorder Father   . Post-traumatic stress disorder Sister       Prior to Admission medications   Medication Sig Start Date End Date Taking? Authorizing Provider  benztropine (COGENTIN) 0.5 MG tablet Take 1 tablet (0.5 mg total) by mouth 2 (two) times daily. Abnormal movements 10/14/19  Yes Eappen, Ria Clock, MD  busPIRone (BUSPAR) 15 MG tablet Take 1 tablet (15 mg total) by mouth 2 (two) times daily. 10/14/19  Yes Ursula Alert, MD  desvenlafaxine (PRISTIQ) 50 MG 24 hr tablet Take 1 tablet (50 mg total) by mouth at bedtime. 10/14/19  Yes Ursula Alert, MD  gabapentin (NEURONTIN) 600 MG tablet Take 1 tablet (600 mg total) by mouth in the morning, at noon, in the evening, and at bedtime. 11/17/19 12/17/19 Yes Milinda Pointer, MD  hydrOXYzine (ATARAX/VISTARIL) 25 MG tablet TAKE 1 TABLET BY MOUTH EVERY 6 HOURS AS NEEDED Patient  taking differently: Take 25 mg by mouth every 6 (six) hours as needed.  11/09/19  Yes Ursula Alert, MD  ibuprofen (ADVIL) 800 MG tablet Take 800 mg by mouth 3 (three) times daily. 11/04/19  Yes [provider]  KLOR-CON M20 20 MEQ tablet Take 0.5 tablets (10 mEq total) by mouth 2 (two) times daily. Patient taking differently: Take 20 mEq by mouth 2 (two) times daily.  11/18/18  Yes Mayo, Pete Pelt, MD  methocarbamol (ROBAXIN) 750 MG tablet Take 750 mg by mouth 3 (three) times daily.   Yes [provider]  metoprolol succinate (TOPROL-XL) 25 MG 24 hr tablet  Take 25 mg by mouth daily. Takes in the evening 02/27/19  Yes [provider]  montelukast (SINGULAIR) 10 MG tablet Take 10 mg by mouth daily. Takes in the afternoon   Yes [provider]  sucralfate (CARAFATE) 1 g tablet Take 1 g by mouth 4 (four) times daily.   Yes [provider]  topiramate (TOPAMAX) 50 MG tablet Take 50 mg by mouth 2 (two) times daily.   Yes [provider]  torsemide (DEMADEX) 20 MG tablet Take 1 tablet (20 mg total) by mouth 2 (two) times daily. 11/18/18 12/02/19 Yes Mayo, Pete Pelt, MD  TRELEGY ELLIPTA 100-62.5-25 MCG/INH AEPB Inhale 1 puff into the lungs daily. Takes at night   Yes [provider]  TRULICITY 1.5 PI/9.5JO SOPN Inject 1.5 mg into the skin once a week. 11/10/19  Yes [provider]  vitamin E 400 UNIT capsule 400 Units daily.    Yes [provider]  atorvastatin (LIPITOR) 40 MG tablet Take 40 mg by mouth at bedtime.     [provider]  B Complex-C (B-COMPLEX WITH VITAMIN C) tablet Take by mouth.    [provider]  Cholecalciferol (VITAMIN D3) 125 MCG (5000 UT) TABS Take by mouth.    [provider]  Continuous Blood Gluc Receiver (DEXCOM G6 RECEIVER) DEVI 1 device use as directed annually 10/09/19   [provider]  Continuous Blood Gluc Sensor (DEXCOM G6 SENSOR) MISC Use 1 each every 30  (thirty) days 10/09/19   [provider]  Continuous Blood Gluc Transmit (DEXCOM G6 TRANSMITTER) MISC Use 1 Device every 3 (three) months 10/09/19   [provider]  darifenacin (ENABLEX) 7.5 MG 24 hr tablet Take 7.5 mg by mouth at bedtime. Use as needed for OAB 03/18/19 03/17/20  [provider]  dicyclomine (BENTYL) 10 MG capsule 3 (three) times daily.     [provider]  famotidine (PEPCID) 20 MG tablet Take 40 mg by mouth daily.  04/01/19   [provider]  fluticasone (FLONASE) 50 MCG/ACT nasal spray Place 2 sprays into both nostrils daily as needed.  06/03/19   [provider]  glipiZIDE (GLUCOTROL XL) 10 MG 24 hr tablet Take by mouth. 11/17/19 11/16/20  [provider]  naproxen (NAPROSYN) 500 MG tablet Take 500 mg by mouth 2 (two) times daily. 12/02/19   [provider]  nitroGLYCERIN (NITROSTAT) 0.4 MG SL tablet Place 0.4 mg under the tongue every 5 (five) minutes as needed for chest pain.     [provider]  omeprazole (PRILOSEC) 40 MG capsule  11/16/19   [provider]  ondansetron (ZOFRAN-ODT) 8 MG disintegrating tablet  11/12/19   [provider]  promethazine (PHENERGAN) 25 MG tablet  11/12/19   [provider]  QUEtiapine (SEROQUEL) 100 MG tablet Take 1 tablet (100 mg total) by mouth at bedtime. To be combined with 25 mg at bedtime 11/25/19   Ursula Alert, MD  QUEtiapine (SEROQUEL) 25 MG tablet Take 1 tablet (25 mg total) by mouth as directed. Start taking 1 tablet  with 100 mg at bedtime and 1 tablet daily as needed for agitation only 11/25/19   Ursula Alert, MD  rizatriptan (MAXALT) 10 MG tablet Take 1 tablet (10 mg total) by mouth as needed. 01/21/19 11/25/19  Milinda Pointer, MD  VENTOLIN HFA 108 [phone] Base) MCG/ACT inhaler Inhale 1-2 puffs into the lungs every 6 (six) hours as needed for wheezing or shortness of breath.  05/03/18  [provider]    Physical  Exam: Vitals:   12/02/19 0850 12/02/19 1149 12/02/19 1416 12/02/19 1758  BP: 139/88 111/86 110/80 126/85  Pulse: (!) 142 (!) 136 (!) 134 (!) 119  Resp: (!) 22 (!) 24 20 15   Temp: (!) 101.4 F (38.6 C) 99.5 F (37.5 C) 99.5 F (37.5 C)   TempSrc: Oral Oral Oral   SpO2: 95% 94% 95% 97%  Weight: 102.1 kg     Height: 5\' 4"  (1.626 m)        Vitals:   12/02/19 0850 12/02/19 1149 12/02/19 1416 12/02/19 1758  BP: 139/88 111/86 110/80 126/85  Pulse: (!) 142 (!) 136 (!) 134 (!) 119  Resp: (!) 22 (!) 24 20 15   Temp: (!) 101.4 F (38.6 C) 99.5 F (37.5 C) 99.5 F (37.5 C)   TempSrc: Oral Oral Oral   SpO2: 95% 94% 95% 97%  Weight: 102.1 kg     Height: 5\' 4"  (1.626 m)         Constitutional: Alert and oriented x 3 . Not in any apparent distress. Ng tube in place HEENT:      Head: Normocephalic and atraumatic.         Eyes: PERLA, EOMI, Conjunctivae are normal. Sclera is non-icteric.       Mouth/Throat: Mucous membranes are moist.       Neck: Supple with no signs of meningismus. Cardiovascular:  Tachycardic. No murmurs, gallops, or rubs. 2+ symmetrical distal pulses are present . No JVD. No LE edema Respiratory: Respiratory effort normal .Lungs sounds clear bilaterally. No wheezes, crackles, or rhonchi.  Gastrointestinal:  Tender on palpation in in periumbilical area, mild distention, but sluggish bowel sounds Genitourinary: No CVA tenderness. Musculoskeletal: Nontender with normal range of motion in all extremities. No cyanosis, or erythema of extremities. Neurologic:  Face is symmetric. Moving all extremities. No gross focal neurologic deficits . Skin: Skin is warm, dry.  No rash or ulcers Psychiatric: Mood and affect are normal    Labs on Admission: I have personally reviewed following labs and imaging studies  CBC: Recent Labs  Lab 12/02/19 0902  WBC 5.9  HGB 13.4  HCT 39.9  MCV 86.6  PLT 793   Basic Metabolic Panel: Recent Labs  Lab 12/02/19 0902  NA 136  K  3.1*  CL 99  CO2 21*  GLUCOSE 179*  BUN 21*  CREATININE 1.24*  CALCIUM 9.1   GFR: Estimated Creatinine Clearance: 65.3 mL/min (A) (by C-G formula based on SCr of 1.24 mg/dL (H)). Liver Function Tests: No results for input(s): AST, ALT, ALKPHOS, BILITOT, PROT, ALBUMIN in the last 168 hours. No results for input(s): LIPASE, AMYLASE in the last 168 hours. No results for input(s): AMMONIA in the last 168 hours. Coagulation Profile: No results for input(s): INR, PROTIME in the last 168 hours. Cardiac Enzymes: No results for input(s): CKTOTAL, CKMB, CKMBINDEX, TROPONINI in the last 168 hours. BNP (last 3 results) No results for input(s): PROBNP in the last 8760 hours. HbA1C: No results for input(s): HGBA1C in the last 72 hours. CBG: No results for input(s): GLUCAP in the last 168 hours. Lipid Profile: No results for input(s): CHOL, HDL, LDLCALC, TRIG, CHOLHDL, LDLDIRECT in the last 72 hours. Thyroid Function Tests: No results for input(s): TSH, T4TOTAL, FREET4, T3FREE, THYROIDAB in the last 72 hours. Anemia Panel: No results for input(s): VITAMINB12, FOLATE, FERRITIN, TIBC, IRON, RETICCTPCT in the last 72 hours. Urine analysis:    Component Value Date/Time   COLORURINE  YELLOW (A) 10/25/2019 1604   APPEARANCEUR HAZY (A) 10/25/2019 1604   LABSPEC 1.013 10/25/2019 1604   PHURINE 5.0 10/25/2019 1604   GLUCOSEU NEGATIVE 10/25/2019 1604   HGBUR NEGATIVE 10/25/2019 [place] 10/25/2019 [place] 10/25/2019 1604   PROTEINUR NEGATIVE 10/25/2019 1604   NITRITE NEGATIVE 10/25/2019 1604   LEUKOCYTESUR NEGATIVE 10/25/2019 1604    Radiological Exams on Admission: DG Chest 2 View  Result Date: 12/02/2019 CLINICAL DATA:  Tachycardia EXAM: CHEST - 2 VIEW COMPARISON:  06/08/2018 FINDINGS: The lungs are symmetrically expanded. Minimal lingular atelectasis or infiltrate. Previously noted diffuse nodular pulmonary infiltrate has resolved. No pneumothorax or  pleural effusion. Cardiac size within normal limits. Pulmonary vascularity is normal. Dorsal column stimulator leads extend to the level of T7. No acute bone abnormality. IMPRESSION: Minimal lingular atelectasis or infiltrate. Electronically Signed   By: Fidela Salisbury MD   On: 12/02/2019 09:24   CT Chest Wo Contrast  Result Date: 12/02/2019 CLINICAL DATA:  Tachycardia and fever, recent COVID booster shot EXAM: CT CHEST WITHOUT CONTRAST TECHNIQUE: Multidetector CT imaging of the chest was performed following the standard protocol without IV contrast. COMPARISON:  Plain film from earlier in the same day, CT from 04/23/2015, 10/25/2019 FINDINGS: Cardiovascular: Somewhat limited due to lack of IV contrast. No significant atherosclerotic calcifications are noted. No aneurysmal dilatation is seen. No cardiac enlargement is noted. No coronary calcifications are seen. Mediastinum/Nodes: Thoracic inlet is within normal limits. No sizable hilar or mediastinal adenopathy is noted. Fluid is noted within the esophagus likely related to reflux given the degree of gastric distension. In the left axilla, there is some mild inflammatory change and minimal lymph nodes although not significant by size criteria. This likely represents reaction to the recent COVID booster shot. Correlate with the location of the recent booster. Lungs/Pleura: Lungs are well aerated bilaterally. Lingular scarring is noted stable from 2017. This corresponds to the finding seen on recent chest x-ray. Upper Abdomen: Visualized upper abdomen shows the visceral organs to be within normal limits with the exception of a small left adrenal nodule stable from the recent exam. The stomach is distended with significant amount of fluid and there is a single loop of dilated small bowel identified centrally on the inferior aspect of the imaging plane. Comparison with recent CT examination from 10/25/2019 shows evidence of anterior abdominal wall hernia with small  bowel within. This raises suspicion for possible incarceration. CT of the abdomen and pelvis without contrast is recommended. Musculoskeletal: No chest wall mass or suspicious bone lesions identified. Spinal stimulator is noted. IMPRESSION: Changes on recent chest x-ray are related to scarring unchanged from 2017. Increased inflammatory change in the region of the left axilla. This is likely related to the recent COVID booster shot given the patient's clinical history. Correlate with the injection side on the left. Distension of the stomach with fluid as well as a single loop of dilated small bowel suggestive of obstruction. Previous CT of the abdomen and pelvis demonstrates ventral hernia is and these changes are highly suspicious for small bowel incarceration. CT of the abdomen and pelvis without contrast is recommended for further evaluation. These results were called by telephone at the time of interpretation on 12/02/2019 at 4:14 pm to provider Lavonia Drafts , who verbally acknowledged these results. Electronically Signed   By: Inez Catalina M.D.   On: 12/02/2019 16:15   CT ABDOMEN PELVIS W CONTRAST  Result Date: 12/02/2019 CLINICAL DATA:  Nausea, tachycardia, fever,  unspecified abdominal pain EXAM: CT ABDOMEN AND PELVIS WITH CONTRAST TECHNIQUE: Multidetector CT imaging of the abdomen and pelvis was performed using the standard protocol following bolus administration of intravenous contrast. CONTRAST:  152mL OMNIPAQUE IOHEXOL 300 MG/ML  SOLN COMPARISON:  10/25/2019 FINDINGS: Lower chest: Discoid atelectasis at the left lung base. The visualized heart and pericardium are unremarkable. The distal esophagus appears fluid-filled suggesting changes of gastroesophageal reflux or esophageal dysmotility. Hepatobiliary: Moderate hepatic steatosis. No focal liver lesion. Gallbladder unremarkable. No intra or extrahepatic biliary ductal dilation. Pancreas: Unremarkable Spleen: Unremarkable Adrenals/Urinary Tract:  Slight interval increase in left adrenal nodule, now measuring 22 mm, previously measuring 18 mm on remote prior examination of 04/26/2016. This is indeterminate and differential considerations include adrenal adenoma or pheochromocytoma. Right adrenal gland is unremarkable. Kidneys are unremarkable. Bladder is unremarkable. Stomach/Bowel: A distal small bowel obstruction is present with multiple fluid-filled dilated loops of small bowel as well as fluid distension of the stomach. As noted previously, a tiny fat containing ventral hernia and slightly inferior parasagittal ventral hernia containing a single sidewall of small bowel are identified. The previously noted spigelian hernia, is no longer visualized suggesting interval repair. The bone transition of the small bowel is inferior to the expected location of the spigelian hernia, best seen on axial image # 61/2 and sagittal image # 55/6 suggesting a a adhesion in this location. All bowel demonstrates normal mural enhancement. No significant mesenteric edema identified. No free intraperitoneal gas or fluid. The ileum is decompressed. Surgical changes of sigmoid colectomy are identified. The large bowel is otherwise unremarkable. Appendix is unremarkable. Vascular/Lymphatic: No significant vascular findings are present. No enlarged abdominal or pelvic lymph nodes. Reproductive: Status post hysterectomy. No adnexal masses. Other: Rectum unremarkable. Healed laparotomy incision noted. Dorsal column stimulator leads are seen extending to the level of T7 with the battery pack seen within the subcutaneous fat of the right gluteal region. Musculoskeletal: Degenerative changes are seen within the lumbar spine. No lytic or blastic bone lesions are seen. IMPRESSION: Small-bowel obstruction with point of transition in the region of the previously noted spigelian hernia suggesting an adhesion as an underlying cause. Fluid-filled proximal small bowel and stomach with fluid  within the esophagus in keeping with gastroesophageal reflux. Electronically Signed   By: Fidela Salisbury MD   On: 12/02/2019 17:45     Assessment/Plan 49 year old female with history of diabetes, hypertension, hypothyroidism, CKD 3, chronic pain who presents with a complaint of abdominal pain,.    SBO (small bowel obstruction) ([place]) -Patient presenting with abdominal pain, fever and vomiting. -CT abdomen and pelvis showing small bowel obstruction suggesting adhesion as a cause -Continue NG tube to low intermittent suction -IV fluids, IV antiemetics, IV pain medication and supportive care -Surgical consult    AKI (acute kidney injury) ([place]),  CKD stage IIIa -Likely prerenal secondary to acute process above -IV hydration and monitor renal function    Essential (primary) hypertension -Labetalol IV as needed as patient is n.p.o.    Adult hypothyroidism -Hold levothyroxine for now    Controlled type 2 diabetes mellitus without complication (HCC) -Regular insulin sliding scale coverage    Chronic pain -IV pain meds as needed    DVT prophylaxis: SCDs.  Patient refusing Lovenox.  She was made aware of the superior benefit of Lovenox in preventing DVT in hospitalized patient and she voiced understanding. Code Status: full code  Family Communication:  non  Disposition Plan: Back to previous home environment Consults called: Surgery Status:At the time of  admission, it appears that the appropriate admission status for this patient is INPATIENT. This is judged to be reasonable and necessary in order to provide the required intensity of service to ensure the patient's safety given the presenting symptoms, physical exam findings, and initial radiographic and laboratory data in the context of their  Comorbid conditions.   Patient requires inpatient status due to high intensity of service, high risk for further deterioration and high frequency of surveillance required.   I certify that at the  point of admission it is my clinical judgment that the patient will require inpatient hospital care spanning beyond [place] MD [facility]     12/02/2019, 7:06 PM

## 2019-12-03 ENCOUNTER — Encounter: Payer: Self-pay | Admitting: Pain Medicine

## 2019-12-03 ENCOUNTER — Telehealth: Payer: Self-pay

## 2019-12-03 DIAGNOSIS — G8929 Other chronic pain: Secondary | ICD-10-CM

## 2019-12-03 DIAGNOSIS — E039 Hypothyroidism, unspecified: Secondary | ICD-10-CM

## 2019-12-03 DIAGNOSIS — K56609 Unspecified intestinal obstruction, unspecified as to partial versus complete obstruction: Principal | ICD-10-CM

## 2019-12-03 DIAGNOSIS — E876 Hypokalemia: Secondary | ICD-10-CM

## 2019-12-03 LAB — POTASSIUM: Potassium: 3.1 mmol/L — ABNORMAL LOW (ref 3.5–5.1)

## 2019-12-03 LAB — BASIC METABOLIC PANEL
Anion gap: 15 (ref 5–15)
BUN: 27 mg/dL — ABNORMAL HIGH (ref 6–20)
CO2: 26 mmol/L (ref 22–32)
Calcium: 8.7 mg/dL — ABNORMAL LOW (ref 8.9–10.3)
Chloride: 98 mmol/L (ref 98–111)
Creatinine, Ser: 1.19 mg/dL — ABNORMAL HIGH (ref 0.44–1.00)
GFR, Estimated: 54 mL/min — ABNORMAL LOW (ref >60)
Glucose, Bld: 163 mg/dL — ABNORMAL HIGH (ref 70–99)
Potassium: 2.8 mmol/L — ABNORMAL LOW (ref 3.5–5.1)
Sodium: 139 mmol/L (ref 135–145)

## 2019-12-03 LAB — URINALYSIS, COMPLETE (UACMP) WITH MICROSCOPIC
Bacteria, UA: NONE SEEN
Bilirubin Urine: NEGATIVE
Glucose, UA: NEGATIVE mg/dL
Hgb urine dipstick: NEGATIVE
Ketones, ur: NEGATIVE mg/dL
Leukocytes,Ua: NEGATIVE
Nitrite: NEGATIVE
Protein, ur: NEGATIVE mg/dL
Specific Gravity, Urine: 1.043 — ABNORMAL HIGH (ref 1.005–1.030)
pH: 5 (ref 5.0–8.0)

## 2019-12-03 LAB — CBC
HCT: 37.5 % (ref 36.0–46.0)
Hemoglobin: 12.7 g/dL (ref 12.0–15.0)
MCH: 29.5 pg (ref 26.0–34.0)
MCHC: 33.9 g/dL (ref 30.0–36.0)
MCV: 87.2 fL (ref 80.0–100.0)
Platelets: 256 10*3/uL (ref 150–400)
RBC: 4.3 MIL/uL (ref 3.87–5.11)
RDW: 14.7 % (ref 11.5–15.5)
WBC: 2.7 10*3/uL — ABNORMAL LOW (ref 4.0–10.5)
nRBC: 0 % (ref 0.0–0.2)

## 2019-12-03 LAB — GLUCOSE, CAPILLARY
Glucose-Capillary: 149 mg/dL — ABNORMAL HIGH (ref 70–99)
Glucose-Capillary: 167 mg/dL — ABNORMAL HIGH (ref 70–99)
Glucose-Capillary: 146 mg/dL — ABNORMAL HIGH (ref 70–99)
Glucose-Capillary: 121 mg/dL — ABNORMAL HIGH (ref 70–99)
Glucose-Capillary: 154 mg/dL — ABNORMAL HIGH (ref 70–99)
Glucose-Capillary: 97 mg/dL (ref 70–99)

## 2019-12-03 LAB — HIV ANTIBODY (ROUTINE TESTING W REFLEX): HIV Screen 4th Generation wRfx: NONREACTIVE

## 2019-12-03 LAB — PHOSPHORUS: Phosphorus: 4 mg/dL (ref 2.5–4.6)

## 2019-12-03 LAB — MAGNESIUM: Magnesium: 2 mg/dL (ref 1.7–2.4)

## 2019-12-03 MED ORDER — POTASSIUM CHLORIDE 10 MEQ/100ML IV SOLN
10.0000 meq | INTRAVENOUS | Status: AC
  Administered 2019-12-03 (×4): 10 meq via INTRAVENOUS
  Filled 2019-12-03 (×3): qty 100

## 2019-12-03 MED ORDER — POTASSIUM CHLORIDE 10 MEQ/100ML IV SOLN
10.0000 meq | INTRAVENOUS | Status: AC
  Administered 2019-12-03 – 2019-12-04 (×4): 10 meq via INTRAVENOUS
  Filled 2019-12-03 (×3): qty 100

## 2019-12-03 NOTE — Consult Note (Signed)
[facility] SURGICAL CONSULTATION NOTE (initial) - cpt: 16109   HISTORY OF PRESENT ILLNESS (HPI):  49 y.o. female presented to [facility] ED yesterday (10/20) for evaluation of abdominal pain. Patient reports around a 1 day history of periumbilical abdominal pain with associated nausea and non-bloody, non-bilious emesis. She reports her abdominal pain seemed to wax and wane in intensity and was crampy in nature. She has a history of bowel obstructions in the past, and this feels similar. No associated fever, chills, cough, SOB, CP, or urinary changes. She does have an extensive surgical history including abdominal hysterectomy, hernia repair, and most recently ventral hernia repair with excision of mesh and primary closure with Dr Sharen Counter [facility]) in November 2020. She also initially had ventral hernia repair with Dr Launa Flight [facility]) in 6045 which was complicated by need for wound debridement postoperatively. She was seen by Dr Dahlia Byes in September of 2020 but for her recurrence but referred back to [facility] for management. Work up in the ED yesterday was concerning for a slight AKI with sCr - 1.24 (now 1.19), mild hypokalemia to 3.1 (which is worse this morning to 2.8), normal lactic acid levels, high sensitivity troponin negative x3, and CT Abdomen/Pelvis was concerning for   Surgery is consulted by hospitalist physician Dr. Judd Gaudier, MD in this context for evaluation and management of small bowel obstruction.  PAST MEDICAL HISTORY (PMH):  Past Medical History:  Diagnosis Date   Anemia    Anginal pain (HCC)    Anxiety    Arthritis    Asthma    Bilateral lower extremity edema    Bipolar disorder ([place])    CAD (coronary artery disease) unk   CHF (congestive heart failure) (HCC)    Chronic pain syndrome    COPD (chronic obstructive pulmonary disease) ([place])    emphysema   DDD (degenerative disc disease), lumbar    Depression unk   Diabetes mellitus without complication ([place])     Diabetes mellitus, type II ([place])    Drug overdose 05/21/13   after dad died   Dysrhythmia    history of tachy arrythmias   Fibromyalgia    GERD (gastroesophageal reflux disease)    Headache    chronic migraines   Hyperlipidemia    Hypertension    Hypothyroidism    Left leg pain 04/29/2014   MI (myocardial infarction) ([place]) 05-21-05   Muscle ache 09/16/2014   Osteoporosis    Overactive bladder    Pancreatitis unk   Panic attacks    PTSD (post-traumatic stress disorder)    Reflex sympathetic dystrophy    Renal cyst, left    Renal insufficiency    ckd stage iii   Restless legs syndrome    Sleep apnea 06/2019   uses cpap   Sleep apnea    Stroke ([place]) May 21, 2008   TIA x 2. no residual deficits   Suicide attempt [facility])    after father died.   Thyroid disease    thyroid nodule   TIA (transient ischemic attack) unk   TIA (transient ischemic attack)      PAST SURGICAL HISTORY [facility]):  Past Surgical History:  Procedure Laterality Date   ABDOMINAL HYSTERECTOMY     CARDIAC CATHETERIZATION     HERNIA REPAIR  07/15/2017   [facility]   lumbar facet     several   prolapse rectum surgery N/A July 2016   THORACIC LAMINECTOMY FOR SPINAL CORD STIMULATOR N/A 07/27/2019   Procedure: THORACIC SPINAL CORD STIMULATOR PLACEMENT WITH RIGHT FLANK  PULSE GENERATOR;  Surgeon: [name], MD;  Location: [facility] ORS;  Service: Neurosurgery;  Laterality: N/A;   TONSILLECTOMY       MEDICATIONS:  Prior to Admission medications   Medication Sig Start Date End Date Taking? Authorizing Provider  atorvastatin (LIPITOR) 40 MG tablet Take 40 mg by mouth at bedtime.    Yes [provider]  B Complex-C (B-COMPLEX WITH VITAMIN C) tablet Take 1 tablet by mouth daily.    Yes [provider]  benztropine (COGENTIN) 0.5 MG tablet Take 1 tablet (0.5 mg total) by mouth 2 (two) times daily. Abnormal movements 10/14/19  Yes Eappen, Ria Clock, MD  busPIRone (BUSPAR) 15 MG tablet Take 1  tablet (15 mg total) by mouth 2 (two) times daily. 10/14/19  Yes Ursula Alert, MD  Cholecalciferol (VITAMIN D3) 125 MCG (5000 UT) TABS Take 1 tablet by mouth daily.    Yes [provider]  darifenacin (ENABLEX) 7.5 MG 24 hr tablet Take 7.5 mg by mouth at bedtime. Use as needed for OAB 03/18/19 03/17/20 Yes [provider]  desvenlafaxine (PRISTIQ) 50 MG 24 hr tablet Take 1 tablet (50 mg total) by mouth at bedtime. 10/14/19  Yes Ursula Alert, MD  dicyclomine (BENTYL) 10 MG capsule Take 10 mg by mouth 3 (three) times daily.    Yes [provider]  famotidine (PEPCID) 20 MG tablet Take 40 mg by mouth 2 (two) times daily.  04/01/19  Yes [provider]  gabapentin (NEURONTIN) 600 MG tablet Take 1 tablet (600 mg total) by mouth in the morning, at noon, in the evening, and at bedtime. 11/17/19 12/17/19 Yes Milinda Pointer, MD  glipiZIDE (GLUCOTROL XL) 10 MG 24 hr tablet Take 10 mg by mouth daily with breakfast.  11/17/19 11/16/20 Yes [provider]  hydrOXYzine (ATARAX/VISTARIL) 25 MG tablet TAKE 1 TABLET BY MOUTH EVERY 6 HOURS AS NEEDED Patient taking differently: Take 25 mg by mouth every 6 (six) hours as needed.  11/09/19  Yes Ursula Alert, MD  ibuprofen (ADVIL) 800 MG tablet Take 800 mg by mouth 3 (three) times daily. 11/04/19  Yes [provider]  KLOR-CON M20 20 MEQ tablet Take 0.5 tablets (10 mEq total) by mouth 2 (two) times daily. Patient taking differently: Take 20 mEq by mouth 2 (two) times daily.  11/18/18  Yes Mayo, Pete Pelt, MD  levothyroxine (SYNTHROID) 175 MCG tablet Take 175 mcg by mouth daily before breakfast.   Yes [provider]  methocarbamol (ROBAXIN) 750 MG tablet Take 750 mg by mouth 3 (three) times daily.   Yes [provider]  metoprolol succinate (TOPROL-XL) 25 MG 24 hr tablet Take 25 mg by mouth daily. Takes in the evening 02/27/19  Yes [provider]  montelukast (SINGULAIR) 10 MG tablet Take 10 mg  by mouth daily. Takes in the afternoon   Yes [provider]  omeprazole (PRILOSEC) 40 MG capsule Take 40 mg by mouth daily.  11/16/19  Yes [provider]  QUEtiapine (SEROQUEL) 100 MG tablet Take 1 tablet (100 mg total) by mouth at bedtime. To be combined with 25 mg at bedtime 11/25/19  Yes Eappen, Ria Clock, MD  QUEtiapine (SEROQUEL) 25 MG tablet Take 1 tablet (25 mg total) by mouth as directed. Start taking 1 tablet  with 100 mg at bedtime and 1 tablet daily as needed for agitation only 11/25/19  Yes Eappen, Saramma, MD  sucralfate (CARAFATE) 1 g tablet Take 1 g by mouth 4 (four) times daily.   Yes [provider]  topiramate (TOPAMAX) 50 MG tablet Take 50 mg by mouth 2 (two) times daily.   Yes [provider]  torsemide (DEMADEX) 20 MG tablet Take 1 tablet (20 mg total) by mouth 2 (two) times daily. 11/18/18 12/02/19 Yes Mayo, Pete Pelt, MD  TRELEGY ELLIPTA 100-62.5-25 MCG/INH AEPB Inhale 1 puff into the lungs daily. Takes at night   Yes [provider]  TRULICITY 1.5 ZO/1.0RU SOPN Inject 1.5 mg into the skin once a week. 11/10/19  Yes [provider]  vitamin E 400 UNIT capsule 400 Units daily.    Yes [provider]  fluticasone (FLONASE) 50 MCG/ACT nasal spray Place 2 sprays into both nostrils daily as needed.  06/03/19   [provider]  naproxen (NAPROSYN) 500 MG tablet Take 500 mg by mouth 2 (two) times daily. 12/02/19   [provider]  nitroGLYCERIN (NITROSTAT) 0.4 MG SL tablet Place 0.4 mg under the tongue every 5 (five) minutes as needed for chest pain.     [provider]  promethazine (PHENERGAN) 25 MG tablet Take 25 mg by mouth every 6 (six) hours as needed.  11/12/19   [provider]  rizatriptan (MAXALT) 10 MG tablet Take 1 tablet (10 mg total) by mouth as needed. 01/21/19 11/25/19  Milinda Pointer, MD  VENTOLIN HFA 108 [phone] Base) MCG/ACT inhaler Inhale 1-2 puffs into the lungs every 6  (six) hours as needed for wheezing or shortness of breath.  05/03/18   [provider]     ALLERGIES:  Allergies  Allergen Reactions   Diazepam Hives and Nausea And Vomiting   Ziprasidone Hcl Other (See Comments) and Itching    CONFUSED CONFUSED Other reaction(s): Other (See Comments) CONFUSED CONFUSED    Azithromycin Hives   Divalproex Sodium Itching   Levofloxacin Hives and Rash    1/31: would like to retry since not taking valium   Lisinopril     Medicine cause kidney failure   Metronidazole Hives    1/31: would like to retry since she is no longer taking valium   Sulfa Antibiotics Hives   Valproic Acid Itching and Rash   Cephalexin Hives   Ciprofloxacin Hives   Penicillins Nausea And Vomiting    Has patient had a PCN reaction causing immediate rash, facial/tongue/throat swelling, SOB or lightheadedness with hypotension: No Has patient had a PCN reaction causing severe rash involving mucus membranes or skin necrosis: No Has patient had a PCN reaction that required hospitalization: No Has patient had a PCN reaction occurring within the last 10 years: No If all of the above answers are "NO", then may proceed with Cephalosporin use.      SOCIAL HISTORY:  Social History   Socioeconomic History   Marital status: Single    Spouse name: Not on file   Number of children: 0   Years of education: Not on file   Highest education level: High school graduate  Occupational History    Comment: not employed  Tobacco Use   Smoking status: Former Smoker    Packs/day: 0.50    Types: Cigarettes    Quit date: 09/01/2018    Years since quitting: 1.2   Smokeless tobacco: Never Used   Tobacco comment: Patient quit smoking 09/01/2018  Vaping Use   Vaping Use: Never used  Substance and Sexual Activity   Alcohol use: No    Alcohol/week: 0.0 standard drinks   Drug use: No   Sexual activity: Not Currently  Other Topics Concern  Not on file  Social  History Narrative   Patient lives with aunt and uncle.   Social Determinants of Health   Financial Resource Strain:    Difficulty of Paying Living Expenses: Not on file  Food Insecurity:    Worried About Charity fundraiser in the Last Year: Not on file   [employer] of Food in the Last Year: Not on file  Transportation Needs:    Lack of Transportation (Medical): Not on file   Lack of Transportation (Non-Medical): Not on file  Physical Activity:    Days of Exercise per Week: Not on file   Minutes of Exercise per Session: Not on file  Stress:    Feeling of Stress : Not on file  Social Connections:    Frequency of Communication with Friends and Family: Not on file   Frequency of Social Gatherings with Friends and Family: Not on file   Attends Religious Services: Not on file   Active Member of Clubs or Organizations: Not on file   Attends Archivist Meetings: Not on file   Marital Status: Not on file  Intimate Partner Violence:    Fear of Current or Ex-Partner: Not on file   Emotionally Abused: Not on file   Physically Abused: Not on file   Sexually Abused: Not on file     FAMILY HISTORY:  Family History  Problem Relation Age of Onset   Diabetes Mellitus II Mother    CAD Mother    Sleep apnea Mother    Osteoarthritis Mother    Osteoporosis Mother    Anxiety disorder Mother    Depression Mother    Bipolar disorder Mother    Bipolar disorder Father    Hypertension Father    Depression Father    Anxiety disorder Father    Post-traumatic stress disorder Sister       REVIEW OF SYSTEMS:  Review of Systems  Constitutional: Negative for chills and fever.  HENT: Negative for congestion and sore throat.   Respiratory: Negative for cough and shortness of breath.   Cardiovascular: Negative for chest pain and palpitations.  Gastrointestinal: Positive for abdominal pain, nausea and vomiting. Negative for blood in stool, constipation and  diarrhea.  Genitourinary: Negative for dysuria and urgency.  All other systems reviewed and are negative.   VITAL SIGNS:  Temp:  [99.5 F (37.5 C)-101.4 F (38.6 C)] 99.5 F (37.5 C) (10/20 1416) Pulse Rate:  [101-142] 101 (10/21 0728) Resp:  [13-24] 18 (10/21 0600) BP: (88-139)/(59-90) 100/73 (10/21 0700) SpO2:  [89 %-97 %] 92 % (10/21 0728) Weight:  [102.1 kg] 102.1 kg (10/20 0850)     Height: 5\' 4"  (162.6 cm) Weight: 102.1 kg BMI (Calculated): 38.6   INTAKE/OUTPUT:  10/20 0701 - 10/21 0700 In: 1000 [I.V.:1000] Out: 4100 [Emesis/NG output:1100]  PHYSICAL EXAM:  Physical Exam Vitals and nursing note reviewed.  Constitutional:      General: She is not in acute distress.    Appearance: Normal appearance. She is obese. She is not ill-appearing.  HENT:     Head: Normocephalic and atraumatic.  Eyes:     General: No scleral icterus.    Conjunctiva/sclera: Conjunctivae normal.     Pupils: Pupils are equal, round, and reactive to light.  Cardiovascular:     Rate and Rhythm: Normal rate and regular rhythm.     Pulses: Normal pulses.     Heart sounds: No murmur heard.   Pulmonary:     Effort: Pulmonary effort  is normal. No respiratory distress.  Abdominal:     General: A surgical scar is present. There is no distension.     Palpations: Abdomen is soft.     Tenderness: There is abdominal tenderness in the periumbilical area. There is no guarding or rebound.       Comments: Abdomen is obese, soft, she has periumbilical soreness, non-distended, no rebound/guarding. I am unable to appreciate ventral hernia as seen on imaging   Genitourinary:    Comments: Deferred Musculoskeletal:     Right lower leg: No edema.     Left lower leg: No edema.  Skin:    General: Skin is warm and dry.     Coloration: Skin is not pale.     Findings: No erythema.  Neurological:     General: No focal deficit present.     Mental Status: She is alert and oriented to person, place, and time.   Psychiatric:        Mood and Affect: Mood normal.        Behavior: Behavior normal.      Labs:  CBC Latest Ref Rng & Units 12/03/2019 12/02/2019 10/25/2019  WBC 4.0 - 10.5 K/uL 2.7(L) 5.9 7.2  Hemoglobin 12.0 - 15.0 g/dL 12.7 13.4 11.9(L)  Hematocrit 36 - 46 % 37.5 39.9 34.9(L)  Platelets 150 - 400 K/uL 256 300 225   CMP Latest Ref Rng & Units 12/03/2019 12/02/2019 10/25/2019  Glucose 70 - 99 mg/dL 163(H) 179(H) 206(H)  BUN 6 - 20 mg/dL 27(H) 21(H) 21(H)  Creatinine 0.44 - 1.00 mg/dL 1.19(H) 1.24(H) 1.16(H)  Sodium 135 - 145 mmol/L 139 136 140  Potassium 3.5 - 5.1 mmol/L 2.8(L) 3.1(L) 3.4(L)  Chloride 98 - 111 mmol/L 98 99 103  CO2 22 - 32 mmol/L 26 21(L) 24  Calcium 8.9 - 10.3 mg/dL 8.7(L) 9.1 8.7(L)  Total Protein 6.5 - 8.1 g/dL - - 7.5  Total Bilirubin 0.3 - 1.2 mg/dL - - 0.6  Alkaline Phos 38 - 126 U/L - - 119  AST 15 - 41 U/L - - 22  ALT 0 - 44 U/L - - 21    Imaging studies:   CT Abdomen/Pelvis (12/02/2019) personally reviewed which shows gastric and small bowel dilatation with transition on the mid-abdominal wall concerning for adhesive disease, and she does appear to have recurrent ventral hernia with loop/side wall of small bowel but this does not appear to be obstructed and there are no signs of bowel compromise, and radiologist report reviewed: IMPRESSION: Small-bowel obstruction with point of transition in the region of the previously noted spigelian hernia suggesting an adhesion as an underlying cause. Fluid-filled proximal small bowel and stomach with fluid within the esophagus in keeping with gastroesophageal reflux.   Assessment/Plan: (ICD-10's: K86.609) 49 y.o. 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 She does appear to have recurrence of ventral hernia which contains fat and loop of small bowel, but this does NOT appear to be the transition point and there is no  evidence of bowel compromise in her work up nor on her examination.    - Appreciate medicine admission  - Agree with NGT decompression; LIS; monitor and record output daily  - NPO + IVF resuscitation   - Monitor abdominal examination; on-going bowel function  - Serial KUBs as needed   - Pain control prn (minimize narcotics); antiemetics prn  - Replete electrolytes; monitor  - No emergent surgical intervention; she  understands that f she were to fail conservative measures then she may require exploration.    - Further management per primary service; we will follow  All of the above findings and recommendations were discussed with the patient, and all of patient's questions were answered to her expressed satisfaction.  Thank you for the opportunity to participate in this patient's care.   -- Edison Simon, PA-C  [facility] 12/03/2019, 7:59 AM [phone] M-F: 7am - 4pm

## 2019-12-03 NOTE — Progress Notes (Addendum)
PHARMACY CONSULT NOTE - FOLLOW UP  Pharmacy Consult for Electrolyte Monitoring and Replacement   Recent Labs: Potassium (mmol/L)  Date Value  12/03/2019 2.8 (L)  02/22/2014 2.9 (L)   Magnesium (mg/dL)  Date Value  09/09/2017 1.9   Calcium (mg/dL)  Date Value  12/03/2019 8.7 (L)   Calcium, Total (mg/dL)  Date Value  02/22/2014 8.7   Albumin (g/dL)  Date Value  10/25/2019 4.1  09/09/2017 4.6   Sodium (mmol/L)  Date Value  12/03/2019 139  09/09/2017 142  02/22/2014 141   Add on labs Phos: 4.0 Mg: 2.0  Assessment: 49yo female with PMH for DM, HTN, hypothyroidism, CKD III, with severe abdominal surgeries (including abdominal hysterectomy, hernia repair, and most recently ventral hernia repair in Nov 2020). Pt presented to ED c/o vomiting (non bloody, non bilious) and abdominal pain x1 day. CT abdomen and pelvis showed SBO. NG tubed placed. Pharmacy has been consulted for electrolyte monitoring and replacement.   Goal of Therapy:  Electrolytes WNL  Plan:  K 2.8 - will order KCl IV 57mEq x 4 runs and recheck K 1 hour after last dose All other electrolytes WNL, no replacement needed at this time.  Monitor electrolytes daily with AM labs  Sherilyn Banker, PharmD Pharmacy Resident  12/03/2019 12:16 PM

## 2019-12-03 NOTE — Progress Notes (Signed)
[facility] NAME: Marie Clarke    MR#:  [id number]  DATE OF BIRTH:  12-30-1970  SUBJECTIVE:   Came in with significant nausea vomiting. Patient has had multiple small bowel obstructions in the past. NG tube was placed. More than 3 L bilious output over 24 hours. Patient reports passing gas. Tolerating ice chips.  REVIEW OF SYSTEMS:   Review of Systems  Constitutional: Negative for chills, fever and weight loss.  HENT: Negative for ear discharge, ear pain and nosebleeds.   Eyes: Negative for blurred vision, pain and discharge.  Respiratory: Negative for sputum production, shortness of breath, wheezing and stridor.   Cardiovascular: Negative for chest pain, palpitations, orthopnea and PND.  Gastrointestinal: Positive for abdominal pain and nausea. Negative for diarrhea and vomiting.  Genitourinary: Negative for frequency and urgency.  Musculoskeletal: Negative for back pain and joint pain.  Neurological: Positive for weakness. Negative for sensory change, speech change and focal weakness.  Psychiatric/Behavioral: Negative for depression and hallucinations. The patient is not nervous/anxious.    Tolerating Diet:npo Tolerating PT: not needed  DRUG ALLERGIES:   Allergies  Allergen Reactions  . Diazepam Hives and Nausea And Vomiting  . Ziprasidone Hcl Other (See Comments) and Itching    CONFUSED CONFUSED Other reaction(s): Other (See Comments) CONFUSED CONFUSED   . Azithromycin Hives  . Divalproex Sodium Hives  . Levofloxacin Hives and Rash    1/31: would like to retry since not taking valium  . Lisinopril     Medicine cause kidney failure  . Metronidazole Hives    1/31: would like to retry since she is no longer taking valium  . Sulfa Antibiotics Hives  . Valproic Acid Itching and Rash  . Cephalexin Hives  . Ciprofloxacin Hives  . Penicillins Hives    Has patient had a PCN reaction causing immediate rash,  facial/tongue/throat swelling, SOB or lightheadedness with hypotension: No Has patient had a PCN reaction causing severe rash involving mucus membranes or skin necrosis: No Has patient had a PCN reaction that required hospitalization: No Has patient had a PCN reaction occurring within the last 10 years: No If all of the above answers are "NO", then may proceed with Cephalosporin use.     VITALS:  Blood pressure 100/73, pulse (!) 101, temperature 99.5 F (37.5 C), temperature source Oral, resp. rate 18, height 5\' 4"  (1.626 m), weight 102.1 kg, SpO2 97 %.  PHYSICAL EXAMINATION:   Physical Exam  GENERAL:  49 y.o.-year-old patient lying in the bed with no acute distress. Obese EYES: Pupils equal, round, reactive to light and accommodation. No scleral icterus.   HEENT: Head atraumatic, normocephalic. Oropharynx and nasopharynx clear.  NECK:  Supple, no jugular venous distention. No thyroid enlargement, no tenderness.  LUNGS: Normal breath sounds bilaterally, no wheezing, rales, rhonchi. No use of accessory muscles of respiration.  CARDIOVASCULAR: S1, S2 normal. No murmurs, rubs, or gallops.  ABDOMEN: Soft, nontender, +distended. Well healed surgical present EXTREMITIES: No cyanosis, clubbing or edema b/l.    NEUROLOGIC: Cranial nerves II through XII are intact. No focal Motor or sensory deficits b/l.   PSYCHIATRIC:  patient is alert and oriented x 3.  SKIN: No obvious rash, lesion, or ulcer.   LABORATORY PANEL:  CBC Recent Labs  Lab 12/03/19 0410  WBC 2.7*  HGB 12.7  HCT 37.5  PLT 256    Chemistries  Recent Labs  Lab 12/03/19 0410  NA 139  K 2.8*  CL  98  CO2 26  GLUCOSE 163*  BUN 27*  CREATININE 1.19*  CALCIUM 8.7*  MG 2.0   Cardiac Enzymes No results for input(s): TROPONINI in the last 168 hours. RADIOLOGY:  DG Chest 2 View  Result Date: 12/02/2019 CLINICAL DATA:  Tachycardia EXAM: CHEST - 2 VIEW COMPARISON:  06/08/2018 FINDINGS: The lungs are symmetrically  expanded. Minimal lingular atelectasis or infiltrate. Previously noted diffuse nodular pulmonary infiltrate has resolved. No pneumothorax or pleural effusion. Cardiac size within normal limits. Pulmonary vascularity is normal. Dorsal column stimulator leads extend to the level of T7. No acute bone abnormality. IMPRESSION: Minimal lingular atelectasis or infiltrate. Electronically Signed   By: Fidela Salisbury MD   On: 12/02/2019 09:24   CT Chest Wo Contrast  Result Date: 12/02/2019 CLINICAL DATA:  Tachycardia and fever, recent COVID booster shot EXAM: CT CHEST WITHOUT CONTRAST TECHNIQUE: Multidetector CT imaging of the chest was performed following the standard protocol without IV contrast. COMPARISON:  Plain film from earlier in the same day, CT from 04/23/2015, 10/25/2019 FINDINGS: Cardiovascular: Somewhat limited due to lack of IV contrast. No significant atherosclerotic calcifications are noted. No aneurysmal dilatation is seen. No cardiac enlargement is noted. No coronary calcifications are seen. Mediastinum/Nodes: Thoracic inlet is within normal limits. No sizable hilar or mediastinal adenopathy is noted. Fluid is noted within the esophagus likely related to reflux given the degree of gastric distension. In the left axilla, there is some mild inflammatory change and minimal lymph nodes although not significant by size criteria. This likely represents reaction to the recent COVID booster shot. Correlate with the location of the recent booster. Lungs/Pleura: Lungs are well aerated bilaterally. Lingular scarring is noted stable from 2017. This corresponds to the finding seen on recent chest x-ray. Upper Abdomen: Visualized upper abdomen shows the visceral organs to be within normal limits with the exception of a small left adrenal nodule stable from the recent exam. The stomach is distended with significant amount of fluid and there is a single loop of dilated small bowel identified centrally on the inferior  aspect of the imaging plane. Comparison with recent CT examination from 10/25/2019 shows evidence of anterior abdominal wall hernia with small bowel within. This raises suspicion for possible incarceration. CT of the abdomen and pelvis without contrast is recommended. Musculoskeletal: No chest wall mass or suspicious bone lesions identified. Spinal stimulator is noted. IMPRESSION: Changes on recent chest x-ray are related to scarring unchanged from 2017. Increased inflammatory change in the region of the left axilla. This is likely related to the recent COVID booster shot given the patient's clinical history. Correlate with the injection side on the left. Distension of the stomach with fluid as well as a single loop of dilated small bowel suggestive of obstruction. Previous CT of the abdomen and pelvis demonstrates ventral hernia is and these changes are highly suspicious for small bowel incarceration. CT of the abdomen and pelvis without contrast is recommended for further evaluation. These results were called by telephone at the time of interpretation on 12/02/2019 at 4:14 pm to provider Lavonia Drafts , who verbally acknowledged these results. Electronically Signed   By: Inez Catalina M.D.   On: 12/02/2019 16:15   CT ABDOMEN PELVIS W CONTRAST  Result Date: 12/02/2019 CLINICAL DATA:  Nausea, tachycardia, fever, unspecified abdominal pain EXAM: CT ABDOMEN AND PELVIS WITH CONTRAST TECHNIQUE: Multidetector CT imaging of the abdomen and pelvis was performed using the standard protocol following bolus administration of intravenous contrast. CONTRAST:  14mL OMNIPAQUE IOHEXOL 300  MG/ML  SOLN COMPARISON:  10/25/2019 FINDINGS: Lower chest: Discoid atelectasis at the left lung base. The visualized heart and pericardium are unremarkable. The distal esophagus appears fluid-filled suggesting changes of gastroesophageal reflux or esophageal dysmotility. Hepatobiliary: Moderate hepatic steatosis. No focal liver lesion.  Gallbladder unremarkable. No intra or extrahepatic biliary ductal dilation. Pancreas: Unremarkable Spleen: Unremarkable Adrenals/Urinary Tract: Slight interval increase in left adrenal nodule, now measuring 22 mm, previously measuring 18 mm on remote prior examination of 04/26/2016. This is indeterminate and differential considerations include adrenal adenoma or pheochromocytoma. Right adrenal gland is unremarkable. Kidneys are unremarkable. Bladder is unremarkable. Stomach/Bowel: A distal small bowel obstruction is present with multiple fluid-filled dilated loops of small bowel as well as fluid distension of the stomach. As noted previously, a tiny fat containing ventral hernia and slightly inferior parasagittal ventral hernia containing a single sidewall of small bowel are identified. The previously noted spigelian hernia, is no longer visualized suggesting interval repair. The bone transition of the small bowel is inferior to the expected location of the spigelian hernia, best seen on axial image # 61/2 and sagittal image # 55/6 suggesting a a adhesion in this location. All bowel demonstrates normal mural enhancement. No significant mesenteric edema identified. No free intraperitoneal gas or fluid. The ileum is decompressed. Surgical changes of sigmoid colectomy are identified. The large bowel is otherwise unremarkable. Appendix is unremarkable. Vascular/Lymphatic: No significant vascular findings are present. No enlarged abdominal or pelvic lymph nodes. Reproductive: Status post hysterectomy. No adnexal masses. Other: Rectum unremarkable. Healed laparotomy incision noted. Dorsal column stimulator leads are seen extending to the level of T7 with the battery pack seen within the subcutaneous fat of the right gluteal region. Musculoskeletal: Degenerative changes are seen within the lumbar spine. No lytic or blastic bone lesions are seen. IMPRESSION: Small-bowel obstruction with point of transition in the region of  the previously noted spigelian hernia suggesting an adhesion as an underlying cause. Fluid-filled proximal small bowel and stomach with fluid within the esophagus in keeping with gastroesophageal reflux. Electronically Signed   By: Fidela Salisbury MD   On: 12/02/2019 17:45   DG Abd Portable 1 View  Result Date: 12/02/2019 CLINICAL DATA:  NG tube placement EXAM: PORTABLE ABDOMEN - 1 VIEW COMPARISON:  None. FINDINGS: NG tube tip is within the stomach. IMPRESSION: NG tube in the stomach. Electronically Signed   By: Rolm Baptise M.D.   On: 12/02/2019 19:14   ASSESSMENT AND PLAN:  49yo female with PMH for DM, HTN, hypothyroidism, CKD III, with severe abdominal surgeries (including abdominal hysterectomy, hernia repair, and most recently ventral hernia repair in Nov 2020). Pt presented to ED c/o vomiting (non bloody, non bilious) and abdominal pain x1 day. CT abdomen and pelvis showed SBO. NG tubed placed.    SBO (small bowel obstruction) ([place]), recurrent complex surgeries in the past -Patient presenting with abdominal pain, fever and vomiting. -CT abdomen and pelvis showing small bowel obstruction suggesting adhesion as a cause -Continue NG tube to low intermittent suction-- patient had 3 L of NG output bilious 24 hours -- ports passing gas  -IV fluids, IV antiemetics, IV pain medication and supportive care -Surgical consult-- Dr. Christian Mate-- recommends continue conservative management    AKI (acute kidney injury) ([place]),  CKD stage IIIa -Likely prerenal secondary to acute process above -IV hydration and monitor renal function    Essential (primary) hypertension -Labetalol IV as needed as patient is n.p.o.    Adult hypothyroidism -Hold levothyroxine for now    Controlled  type 2 diabetes mellitus without complication (HCC) -Regular insulin sliding scale coverage    Chronic pain -IV pain meds as needed    DVT prophylaxis: SCDs.  Patient refusing Lovenox. Code Status: full code   Family Communication:  none Disposition Plan: Back to previous home environment Consults called: Surgery Status:At the time of admission, it appears that the appropriate admission status for this patient is INPATIENT. Patient continues to have significant NG output. Surgical consultation appreciated. Continue conservative management for now.     Dispo: The patient is from: Home              Anticipated d/c is to: Home              Anticipated d/c date is: 3 days              Patient currently is not medically stable to d/c.        TOTAL TIME TAKING CARE OF THIS PATIENT: 25 minutes.  >50% time spent on counselling and coordination of care  Note: This dictation was prepared with Dragon dictation along with smaller phrase technology. Any transcriptional errors that result from this process are unintentional.  Fritzi Mandes M.D    [facility]   CC: Primary care physician; Sharyne Peach, MDPatient ID: Marie Clarke, female   DOB: 08-31-1970, 49 y.o.   MRN: [id number]

## 2019-12-03 NOTE — Progress Notes (Signed)
PHARMACY CONSULT NOTE - FOLLOW UP  Pharmacy Consult for Electrolyte Monitoring and Replacement   Recent Labs: Potassium (mmol/L)  Date Value  12/03/2019 3.1 (L)  02/22/2014 2.9 (L)   Magnesium (mg/dL)  Date Value  12/03/2019 2.0   Calcium (mg/dL)  Date Value  12/03/2019 8.7 (L)   Calcium, Total (mg/dL)  Date Value  02/22/2014 8.7   Albumin (g/dL)  Date Value  10/25/2019 4.1  09/09/2017 4.6   Phosphorus (mg/dL)  Date Value  12/03/2019 4.0   Sodium (mmol/L)  Date Value  12/03/2019 139  09/09/2017 142  02/22/2014 141   Add on labs Phos: 4.0 Mg: 2.0  Assessment: 49yo female with PMH for DM, HTN, hypothyroidism, CKD III, with severe abdominal surgeries (including abdominal hysterectomy, hernia repair, and most recently ventral hernia repair in Nov 2020). Pt presented to ED c/o vomiting (non bloody, non bilious) and abdominal pain x1 day. CT abdomen and pelvis showed SBO. NG tubed placed. Pharmacy has been consulted for electrolyte monitoring and replacement.   Goal of Therapy:  Electrolytes WNL  Plan:  K 3.1 s/p KCl IV 81mEq x 4 runs - will order additional KCl IV 80mEq x4 runs Monitor electrolytes daily with AM labs and supplement as needed  Sherilyn Banker, PharmD Pharmacy Resident  12/03/2019 7:27 PM

## 2019-12-04 ENCOUNTER — Inpatient Hospital Stay: Payer: Medicare HMO

## 2019-12-04 DIAGNOSIS — N179 Acute kidney failure, unspecified: Secondary | ICD-10-CM

## 2019-12-04 LAB — HEMOGLOBIN A1C
Hgb A1c MFr Bld: 6.4 % — ABNORMAL HIGH (ref 4.8–5.6)
Mean Plasma Glucose: 137 mg/dL

## 2019-12-04 LAB — RENAL FUNCTION PANEL
Albumin: 3.4 g/dL — ABNORMAL LOW (ref 3.5–5.0)
Anion gap: 13 (ref 5–15)
BUN: 24 mg/dL — ABNORMAL HIGH (ref 6–20)
CO2: 30 mmol/L (ref 22–32)
Calcium: 8.8 mg/dL — ABNORMAL LOW (ref 8.9–10.3)
Chloride: 100 mmol/L (ref 98–111)
Creatinine, Ser: 0.85 mg/dL (ref 0.44–1.00)
GFR, Estimated: >60 mL/min (ref >60)
Glucose, Bld: 140 mg/dL — ABNORMAL HIGH (ref 70–99)
Phosphorus: 2.9 mg/dL (ref 2.5–4.6)
Potassium: 2.9 mmol/L — ABNORMAL LOW (ref 3.5–5.1)
Sodium: 143 mmol/L (ref 135–145)

## 2019-12-04 LAB — GLUCOSE, CAPILLARY
Glucose-Capillary: 133 mg/dL — ABNORMAL HIGH (ref 70–99)
Glucose-Capillary: 126 mg/dL — ABNORMAL HIGH (ref 70–99)
Glucose-Capillary: 119 mg/dL — ABNORMAL HIGH (ref 70–99)
Glucose-Capillary: 123 mg/dL — ABNORMAL HIGH (ref 70–99)
Glucose-Capillary: 129 mg/dL — ABNORMAL HIGH (ref 70–99)

## 2019-12-04 LAB — MAGNESIUM: Magnesium: 2.2 mg/dL (ref 1.7–2.4)

## 2019-12-04 MED ORDER — PROMETHAZINE HCL 25 MG/ML IJ SOLN
25.0000 mg | Freq: Once | INTRAMUSCULAR | Status: AC
  Administered 2019-12-04: 25 mg via INTRAVENOUS
  Filled 2019-12-04: qty 1

## 2019-12-04 MED ORDER — HYDROMORPHONE HCL 1 MG/ML IJ SOLN
INTRAMUSCULAR | Status: AC
  Administered 2019-12-04: 0.5 mg via INTRAVENOUS
  Filled 2019-12-04: qty 1

## 2019-12-04 MED ORDER — POTASSIUM CHLORIDE 10 MEQ/100ML IV SOLN
10.0000 meq | INTRAVENOUS | Status: AC
  Administered 2019-12-04 (×4): 10 meq via INTRAVENOUS
  Filled 2019-12-04 (×4): qty 100

## 2019-12-04 MED ORDER — HYDROMORPHONE HCL 1 MG/ML IJ SOLN
0.5000 mg | INTRAMUSCULAR | Status: DC | PRN
  Administered 2019-12-05 – 2019-12-09 (×17): 0.5 mg via INTRAVENOUS
  Filled 2019-12-04 (×17): qty 0.5

## 2019-12-04 NOTE — Care Management Important Message (Signed)
Important Message  Patient Details  Name: Marie Clarke MRN: [id number] Date of Birth: [birth year]   Medicare Important Message Given:  N/A - LOS <3 / Initial given by admissions  Initial Medicare IM given by Patient Access Associate on 12/03/2019 at 8:07am.   Dannette Barbara 12/04/2019, 8:23 AM

## 2019-12-04 NOTE — Progress Notes (Signed)
RT to patient bedside for CPAP placement. Patient complains of nausea and feeling sick. RN notified. CPAP to be placed once patient is no longer nauseated, for protection against aspiration.

## 2019-12-04 NOTE — Progress Notes (Signed)
[place] for Electrolyte Monitoring and Replacement   Recent Labs: Potassium (mmol/L)  Date Value  12/04/2019 2.9 (L)  02/22/2014 2.9 (L)   Magnesium (mg/dL)  Date Value  12/04/2019 2.2   Calcium (mg/dL)  Date Value  12/04/2019 8.8 (L)   Calcium, Total (mg/dL)  Date Value  02/22/2014 8.7   Albumin (g/dL)  Date Value  12/04/2019 3.4 (L)  09/09/2017 4.6   Phosphorus (mg/dL)  Date Value  12/04/2019 2.9   Sodium (mmol/L)  Date Value  12/04/2019 143  09/09/2017 142  02/22/2014 141    Assessment: 49yo female with PMH for DM, HTN, hypothyroidism, CKD III, with severe abdominal surgeries (including abdominal hysterectomy, hernia repair, and most recently ventral hernia repair in Nov 2020). Pt presented to ED c/o vomiting (non bloody, non bilious) and abdominal pain x1 day. CT abdomen and pelvis showed SBO. NG tube clamped. Pharmacy has been consulted for electrolyte monitoring and replacement.   Goal of Therapy:  Electrolytes WNL  Plan:   KCl IV 72mEq x 4   Monitor electrolytes daily with AM labs and supplement as needed  Marie Clarke, PharmD 12/04/2019 7:21 AM

## 2019-12-04 NOTE — Progress Notes (Addendum)
[facility] SURGICAL PROGRESS NOTE (cpt [phone])  Hospital Day(s): 2.   Interval History: Patient seen and examined, no acute events or new complaints overnight. Patient reports she is feeling much better this morning, she denies fever, chills, nausea, emesis, abdominal pain, distension. Still with hypokalemia to 2.9 despite repletion. sCr remains normal at 0.85. NGT output recorded at 3.0L however I am suspicious to the quantity of her PO intake is falsifying the output. She reports she had a "good bit" of ice/water. With further questioning she admits to "7+ cups of ice/water and a bottle of ginger ale" yesterday. She has had multiple BMs recorded and is passing flatus. No other complaints.   Review of Systems:  Constitutional: denies fever, chills  HEENT: denies cough or congestion  Respiratory: denies any shortness of breath  Cardiovascular: denies chest pain or palpitations  Gastrointestinal: denies abdominal pain, N/V, or diarrhea/and bowel function as per interval history Genitourinary: denies burning with urination or urinary frequency Musculoskeletal: denies pain, decreased motor or sensation  Vital signs in last 24 hours: [min-max] current  Temp:  [98.3 F (36.8 C)-99.1 F (37.3 C)] 98.3 F (36.8 C) (10/22 0333) Pulse Rate:  [99-104] 99 (10/22 0333) Resp:  [16-18] 18 (10/22 0333) BP: (109-127)/(90-95) 127/90 (10/22 0333) SpO2:  [92 %-97 %] 95 % (10/22 0333)     Height: 5\' 4"  (162.6 cm) Weight: 102.1 kg BMI (Calculated): 38.6   Intake/Output last 2 shifts:  10/21 0701 - 10/22 0700 In: 3357.8 [I.V.:2967.6; IV Piggyback:390.2] Out: 3100 [Urine:100; Emesis/NG output:3000]   Physical Exam:  Constitutional: alert, cooperative and no distress  HENT: normocephalic without obvious abnormality, NGT in place Eyes: PERRL, EOM's grossly intact and symmetric  Respiratory: breathing non-labored at rest  Cardiovascular: regular rate and sinus rhythm  Gastrointestinal:  Soft, non-tender, and non-distended, no rebound/guarding  Musculoskeletal: No edema or wounds, motor and sensation grossly intact, NT    Labs:  CBC Latest Ref Rng & Units 12/03/2019 12/02/2019 10/25/2019  WBC 4.0 - 10.5 K/uL 2.7(L) 5.9 7.2  Hemoglobin 12.0 - 15.0 g/dL 12.7 13.4 11.9(L)  Hematocrit 36 - 46 % 37.5 39.9 34.9(L)  Platelets 150 - 400 K/uL 256 300 225   CMP Latest Ref Rng & Units 12/04/2019 12/03/2019 12/03/2019  Glucose 70 - 99 mg/dL 140(H) - 163(H)  BUN 6 - 20 mg/dL 24(H) - 27(H)  Creatinine 0.44 - 1.00 mg/dL 0.85 - 1.19(H)  Sodium 135 - 145 mmol/L 143 - 139  Potassium 3.5 - 5.1 mmol/L 2.9(L) 3.1(L) 2.8(L)  Chloride 98 - 111 mmol/L 100 - 98  CO2 22 - 32 mmol/L 30 - 26  Calcium 8.9 - 10.3 mg/dL 8.8(L) - 8.7(L)  Total Protein 6.5 - 8.1 g/dL - - -  Total Bilirubin 0.3 - 1.2 mg/dL - - -  Alkaline Phos 38 - 126 U/L - - -  AST 15 - 41 U/L - - -  ALT 0 - 44 U/L - - -     Imaging studies:   KUB (12/04/2019) personally reviewed which appears to have dilated loop of small bowel in LUQ but overall small bowel dilation appears improved with air seen in colon/rectum, and radiologist report reviewed: IMPRESSION: Dilated gas-filled small bowel with evidence of wall thickening suggesting small bowel obstruction.   Assessment/Plan: (ICD-10's: K34.609) 49 y.o. 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 She does  appear to have recurrence of ventral hernia which contains fat and loop of small bowel, but this does NOT appear to be the transition point and there is no evidence of bowel compromise in her work up nor on her examination.    - NGT output is likely falsely elevated given significant PO intake yesterday. She is otherwise clinically improved. I will get clamping trial. Clamp NGT x4 hours.  I extensively educated the patient to NOT have any PO intake over these 4 hours. After 4 hours, we will check residuals and if less than 150 ccs we can remove this and start diet.               - NPO + IVF resuscitation; limit consumption of ice chips/water             - Monitor abdominal examination; on-going bowel function             - Serial KUBs as needed              - Pain control prn (minimize narcotics); antiemetics prn             - Replete electrolytes; monitor  - Mobilization as tolerates              - No emergent surgical intervention; she understands that f she were to fail conservative measures then she may require exploration.               - Further management per primary service; we will follow  All of the above findings and recommendations were discussed with the patient, and the medical team, and all of patient's questions were answered to her expressed satisfaction.  -- Edison Simon, PA-C [facility] 12/04/2019, 7:21 AM [phone] M-F: 7am - 4pm  NGT residual checked.  150 cc. NGT removed and CLD ordered.  I saw and evaluated the patient.  I agree with the above documentation, exam, and plan, which I have edited where appropriate. Fredirick Maudlin  4:43 PM

## 2019-12-04 NOTE — Progress Notes (Signed)
Initial Nutrition Assessment  DOCUMENTATION CODES:   Obesity unspecified  INTERVENTION:   RD will add supplements once diet advanced  NUTRITION DIAGNOSIS:   Inadequate oral intake related to acute illness as evidenced by NPO status.  GOAL:   Patient will meet greater than or equal to 90% of their needs  MONITOR:   Diet advancement, Labs, Weight trends, Skin, I & O's  REASON FOR ASSESSMENT:   Malnutrition Screening Tool    ASSESSMENT:   49 y/o female with PMH for DM, HTN, hypothyroidism, CKD III, bipolar disorder s/p multiple abominal surgeries (including abdominal hysterectomy, hernia repair, and most recently ventral hernia repair in Nov 2020). Pt now admitted with SBO.   Met with pt in room today. Pt reports good appetite and oral intake at baseline. Pt reports that she was eating well up until one day pta when she started having nausea and abdominal pain. Pt is currently NPO. NGT in place. Pt undergoing clamp trial currently. RD will add supplements once diet advanced; pt prefers butter pecan flavor. Per chart, pt is down 15lbs(6%) over the past 3 months; this is not significant. Pt reports her UBW is ~240lbs.   Medications reviewed and include: insulin, NaCl '@100ml' /hr, KCl, zofran  Labs reviewed: K 2.9(L), BUN 24(H), Mg 2.2 wnl Wbc- 2.7(L)- 10/21 cbgs- 163, 140 x 24 hrs AIC 6.4(H)- 10/21  NUTRITION - FOCUSED PHYSICAL EXAM:    Most Recent Value  Orbital Region No depletion  Upper Arm Region No depletion  Thoracic and Lumbar Region No depletion  Buccal Region No depletion  Temple Region No depletion  Clavicle Bone Region No depletion  Clavicle and Acromion Bone Region No depletion  Scapular Bone Region No depletion  Dorsal Hand No depletion  Patellar Region No depletion  Anterior Thigh Region No depletion  Posterior Calf Region No depletion  Edema (RD Assessment) None  Hair Reviewed  Eyes Reviewed  Mouth Reviewed  Skin Reviewed  Nails Reviewed     Diet  Order:   Diet Order            Diet NPO time specified  Diet effective now                EDUCATION NEEDS:   Education needs have been addressed  Skin:  Skin Assessment: Reviewed RN Assessment  Last BM:  10/21- type 6  Height:   Ht Readings from Last 1 Encounters:  12/02/19 '5\' 4"'  (1.626 m)    Weight:   Wt Readings from Last 1 Encounters:  12/02/19 102.1 kg    Ideal Body Weight:  54.5 kg  BMI:  Body mass index is 38.62 kg/m.  Estimated Nutritional Needs:   Kcal:  2000-2300kcal/day  Protein:  100-115g/day  Fluid:  1.6-1.9L/day  Koleen Distance MS, RD, LDN Please refer to [facility] for RD and/or RD on-call/weekend/after hours pager

## 2019-12-04 NOTE — Progress Notes (Signed)
[facility] NAME: Marie Clarke    MR#:  [id number]  DATE OF BIRTH:  1970-12-12  SUBJECTIVE:   Patient feels better. Had multiple bowel movements. Wants to go home. She wants to get NG removed  Drank a  whole bottle of Sprite brought from home. Consuming lot of ice and water in the ER  NG clamped by surgery. If shows improvement likely will be removed later today. REVIEW OF SYSTEMS:   Review of Systems  Constitutional: Negative for chills, fever and weight loss.  HENT: Negative for ear discharge, ear pain and nosebleeds.   Eyes: Negative for blurred vision, pain and discharge.  Respiratory: Negative for sputum production, shortness of breath, wheezing and stridor.   Cardiovascular: Negative for chest pain, palpitations, orthopnea and PND.  Gastrointestinal: Positive for abdominal pain and nausea. Negative for diarrhea and vomiting.  Genitourinary: Negative for frequency and urgency.  Musculoskeletal: Negative for back pain and joint pain.  Neurological: Positive for weakness. Negative for sensory change, speech change and focal weakness.  Psychiatric/Behavioral: Negative for depression and hallucinations. The patient is not nervous/anxious.    Tolerating Diet:npo Tolerating PT: not needed  DRUG ALLERGIES:   Allergies  Allergen Reactions  . Diazepam Hives and Nausea And Vomiting  . Ziprasidone Hcl Other (See Comments) and Itching    CONFUSED CONFUSED Other reaction(s): Other (See Comments) CONFUSED CONFUSED   . Azithromycin Hives  . Divalproex Sodium Hives  . Levofloxacin Hives and Rash    1/31: would like to retry since not taking valium  . Lisinopril     Medicine cause kidney failure  . Metronidazole Hives    1/31: would like to retry since she is no longer taking valium  . Sulfa Antibiotics Hives  . Valproic Acid Itching and Rash  . Cephalexin Hives  . Ciprofloxacin Hives  . Penicillins Hives    Has patient had  a PCN reaction causing immediate rash, facial/tongue/throat swelling, SOB or lightheadedness with hypotension: No Has patient had a PCN reaction causing severe rash involving mucus membranes or skin necrosis: No Has patient had a PCN reaction that required hospitalization: No Has patient had a PCN reaction occurring within the last 10 years: No If all of the above answers are "NO", then may proceed with Cephalosporin use.     VITALS:  Blood pressure 116/79, pulse 83, temperature 99 F (37.2 C), resp. rate 18, height 5\' 4"  (1.626 m), weight 102.1 kg, SpO2 93 %.  PHYSICAL EXAMINATION:   Physical Exam  GENERAL:  49 y.o.-year-old patient lying in the bed with no acute distress. Obese  HEENT: Head atraumatic, normocephalic. Oropharynx and nasopharynx clear. NG+ NECK:  Supple, no jugular venous distention. No thyroid enlargement, no tenderness.  LUNGS: Normal breath sounds bilaterally, no wheezing, rales, rhonchi. No use of accessory muscles of respiration.  CARDIOVASCULAR: S1, S2 normal. No murmurs, rubs, or gallops.  ABDOMEN: Soft, nontender, +distended. Well healed surgical present EXTREMITIES: No cyanosis, clubbing or edema b/l.    NEUROLOGIC: Cranial nerves II through XII are intact. No focal Motor or sensory deficits b/l.   PSYCHIATRIC:  patient is alert and oriented x 3.  SKIN: No obvious rash, lesion, or ulcer.   LABORATORY PANEL:  CBC Recent Labs  Lab 12/03/19 0410  WBC 2.7*  HGB 12.7  HCT 37.5  PLT 256    Chemistries  Recent Labs  Lab 12/03/19 0410 12/04/19 0542 12/04/19 0550  NA  --  143  --  K  --  2.9*  --   CL  --  100  --   CO2  --  30  --   GLUCOSE  --  140*  --   BUN  --  24*  --   CREATININE  --  0.85  --   CALCIUM  --  8.8*  --   MG   < >  --  2.2   < > = values in this interval not displayed.   Cardiac Enzymes No results for input(s): TROPONINI in the last 168 hours. RADIOLOGY:  DG Abd 1 View  Result Date: 12/04/2019 CLINICAL DATA:  Small  bowel obstruction EXAM: ABDOMEN - 1 VIEW COMPARISON:  12/02/2019 FINDINGS: Focal gas-filled moderately distended small bowel in the left upper quadrant. Visualized small bowel loops demonstrate evidence of wall thickening. Changes consistent with small bowel obstruction. Enteric tube is present in the left upper quadrant consistent with location in the stomach. Spinal stimulator device. Surgical clips in the pelvis. Degenerative changes in the spine and hips. IMPRESSION: Dilated gas-filled small bowel with evidence of wall thickening suggesting small bowel obstruction. Electronically Signed   By: Lucienne Capers M.D.   On: 12/04/2019 06:58   CT Chest Wo Contrast  Result Date: 12/02/2019 CLINICAL DATA:  Tachycardia and fever, recent COVID booster shot EXAM: CT CHEST WITHOUT CONTRAST TECHNIQUE: Multidetector CT imaging of the chest was performed following the standard protocol without IV contrast. COMPARISON:  Plain film from earlier in the same day, CT from 04/23/2015, 10/25/2019 FINDINGS: Cardiovascular: Somewhat limited due to lack of IV contrast. No significant atherosclerotic calcifications are noted. No aneurysmal dilatation is seen. No cardiac enlargement is noted. No coronary calcifications are seen. Mediastinum/Nodes: Thoracic inlet is within normal limits. No sizable hilar or mediastinal adenopathy is noted. Fluid is noted within the esophagus likely related to reflux given the degree of gastric distension. In the left axilla, there is some mild inflammatory change and minimal lymph nodes although not significant by size criteria. This likely represents reaction to the recent COVID booster shot. Correlate with the location of the recent booster. Lungs/Pleura: Lungs are well aerated bilaterally. Lingular scarring is noted stable from 2017. This corresponds to the finding seen on recent chest x-ray. Upper Abdomen: Visualized upper abdomen shows the visceral organs to be within normal limits with the  exception of a small left adrenal nodule stable from the recent exam. The stomach is distended with significant amount of fluid and there is a single loop of dilated small bowel identified centrally on the inferior aspect of the imaging plane. Comparison with recent CT examination from 10/25/2019 shows evidence of anterior abdominal wall hernia with small bowel within. This raises suspicion for possible incarceration. CT of the abdomen and pelvis without contrast is recommended. Musculoskeletal: No chest wall mass or suspicious bone lesions identified. Spinal stimulator is noted. IMPRESSION: Changes on recent chest x-ray are related to scarring unchanged from 2017. Increased inflammatory change in the region of the left axilla. This is likely related to the recent COVID booster shot given the patient's clinical history. Correlate with the injection side on the left. Distension of the stomach with fluid as well as a single loop of dilated small bowel suggestive of obstruction. Previous CT of the abdomen and pelvis demonstrates ventral hernia is and these changes are highly suspicious for small bowel incarceration. CT of the abdomen and pelvis without contrast is recommended for further evaluation. These results were called by telephone at the time of  interpretation on 12/02/2019 at 4:14 pm to provider Lavonia Drafts , who verbally acknowledged these results. Electronically Signed   By: Inez Catalina M.D.   On: 12/02/2019 16:15   CT ABDOMEN PELVIS W CONTRAST  Result Date: 12/02/2019 CLINICAL DATA:  Nausea, tachycardia, fever, unspecified abdominal pain EXAM: CT ABDOMEN AND PELVIS WITH CONTRAST TECHNIQUE: Multidetector CT imaging of the abdomen and pelvis was performed using the standard protocol following bolus administration of intravenous contrast. CONTRAST:  188mL OMNIPAQUE IOHEXOL 300 MG/ML  SOLN COMPARISON:  10/25/2019 FINDINGS: Lower chest: Discoid atelectasis at the left lung base. The visualized heart and  pericardium are unremarkable. The distal esophagus appears fluid-filled suggesting changes of gastroesophageal reflux or esophageal dysmotility. Hepatobiliary: Moderate hepatic steatosis. No focal liver lesion. Gallbladder unremarkable. No intra or extrahepatic biliary ductal dilation. Pancreas: Unremarkable Spleen: Unremarkable Adrenals/Urinary Tract: Slight interval increase in left adrenal nodule, now measuring 22 mm, previously measuring 18 mm on remote prior examination of 04/26/2016. This is indeterminate and differential considerations include adrenal adenoma or pheochromocytoma. Right adrenal gland is unremarkable. Kidneys are unremarkable. Bladder is unremarkable. Stomach/Bowel: A distal small bowel obstruction is present with multiple fluid-filled dilated loops of small bowel as well as fluid distension of the stomach. As noted previously, a tiny fat containing ventral hernia and slightly inferior parasagittal ventral hernia containing a single sidewall of small bowel are identified. The previously noted spigelian hernia, is no longer visualized suggesting interval repair. The bone transition of the small bowel is inferior to the expected location of the spigelian hernia, best seen on axial image # 61/2 and sagittal image # 55/6 suggesting a a adhesion in this location. All bowel demonstrates normal mural enhancement. No significant mesenteric edema identified. No free intraperitoneal gas or fluid. The ileum is decompressed. Surgical changes of sigmoid colectomy are identified. The large bowel is otherwise unremarkable. Appendix is unremarkable. Vascular/Lymphatic: No significant vascular findings are present. No enlarged abdominal or pelvic lymph nodes. Reproductive: Status post hysterectomy. No adnexal masses. Other: Rectum unremarkable. Healed laparotomy incision noted. Dorsal column stimulator leads are seen extending to the level of T7 with the battery pack seen within the subcutaneous fat of the  right gluteal region. Musculoskeletal: Degenerative changes are seen within the lumbar spine. No lytic or blastic bone lesions are seen. IMPRESSION: Small-bowel obstruction with point of transition in the region of the previously noted spigelian hernia suggesting an adhesion as an underlying cause. Fluid-filled proximal small bowel and stomach with fluid within the esophagus in keeping with gastroesophageal reflux. Electronically Signed   By: Fidela Salisbury MD   On: 12/02/2019 17:45   DG Abd Portable 1 View  Result Date: 12/02/2019 CLINICAL DATA:  NG tube placement EXAM: PORTABLE ABDOMEN - 1 VIEW COMPARISON:  None. FINDINGS: NG tube tip is within the stomach. IMPRESSION: NG tube in the stomach. Electronically Signed   By: Rolm Baptise M.D.   On: 12/02/2019 19:14   ASSESSMENT AND PLAN:  49yo female with PMH for DM, HTN, hypothyroidism, CKD III, with severe abdominal surgeries (including abdominal hysterectomy, hernia repair, and most recently ventral hernia repair in Nov 2020). Pt presented to ED c/o vomiting (non bloody, non bilious) and abdominal pain x1 day. CT abdomen and pelvis showed SBO. NG tubed placed.    SBO (small bowel obstruction) ([place]), recurrent complex surgeries in the past -Patient presenting with abdominal pain, fever and vomiting. -CT abdomen and pelvis showing small bowel obstruction suggesting adhesion as a cause -- NG clamped at present. --had a couple  bowel movements  -IV fluids, IV antiemetics, IV pain medication and supportive care -Surgical consult-- Dr. Christian Mate-- recommends continue conservative management-- so far shows clinical improvement.    AKI (acute kidney injury) ([place]),  CKD stage IIIa -Likely prerenal secondary to acute process above -IV hydration and monitor renal function    Essential (primary) hypertension -Labetalol IV as needed as patient is n.p.o.    Adult hypothyroidism -Hold levothyroxine for now    Controlled type 2 diabetes mellitus  without complication (HCC) -Regular insulin sliding scale coverage    Chronic pain -IV pain meds as needed    DVT prophylaxis: SCDs.  Patient refusing Lovenox. Code Status: full code  Family Communication:  none Disposition Plan: Back to previous home environment Consults called: Surgery Status:At the time of admission, it appears that the appropriate admission status for this patient is INPATIENT. Patient continues to have significant NG output. Surgical consultation appreciated. Continue conservative management for now.     Dispo: The patient is from: Home              Anticipated d/c is to: Home              Anticipated d/c date is:1 day              Patient currently is not medically stable to d/c.  Awaiting for measuring NG output after being plan for several hours. Patient will be discharged once changes removed she is tolerating oral diet. Will follow surgical recommendations for this patient.      TOTAL TIME TAKING CARE OF THIS PATIENT: 25 minutes.  >50% time spent on counselling and coordination of care  Note: This dictation was prepared with Dragon dictation along with smaller phrase technology. Any transcriptional errors that result from this process are unintentional.  Fritzi Mandes M.D    [facility]   CC: Primary care physician; Sharyne Peach, MDPatient ID: [name], female   DOB: 1970/04/11, 49 y.o.   MRN: [id number]

## 2019-12-05 ENCOUNTER — Inpatient Hospital Stay: Payer: Medicare HMO

## 2019-12-05 LAB — BASIC METABOLIC PANEL
Anion gap: 12 (ref 5–15)
BUN: 17 mg/dL (ref 6–20)
CO2: 27 mmol/L (ref 22–32)
Calcium: 8.8 mg/dL — ABNORMAL LOW (ref 8.9–10.3)
Chloride: 98 mmol/L (ref 98–111)
Creatinine, Ser: 0.74 mg/dL (ref 0.44–1.00)
GFR, Estimated: >60 mL/min (ref >60)
Glucose, Bld: 158 mg/dL — ABNORMAL HIGH (ref 70–99)
Potassium: 2.7 mmol/L — CL (ref 3.5–5.1)
Sodium: 137 mmol/L (ref 135–145)

## 2019-12-05 LAB — GLUCOSE, CAPILLARY
Glucose-Capillary: 102 mg/dL — ABNORMAL HIGH (ref 70–99)
Glucose-Capillary: 132 mg/dL — ABNORMAL HIGH (ref 70–99)
Glucose-Capillary: 141 mg/dL — ABNORMAL HIGH (ref 70–99)
Glucose-Capillary: 147 mg/dL — ABNORMAL HIGH (ref 70–99)
Glucose-Capillary: 153 mg/dL — ABNORMAL HIGH (ref 70–99)
Glucose-Capillary: 91 mg/dL (ref 70–99)
Glucose-Capillary: 97 mg/dL (ref 70–99)

## 2019-12-05 LAB — MAGNESIUM: Magnesium: 2.3 mg/dL (ref 1.7–2.4)

## 2019-12-05 MED ORDER — PHENOL 1.4 % MT LIQD
1.0000 | OROMUCOSAL | Status: DC | PRN
  Administered 2019-12-05: 1 via OROMUCOSAL
  Filled 2019-12-05 (×2): qty 177

## 2019-12-05 MED ORDER — POTASSIUM CHLORIDE 10 MEQ/100ML IV SOLN
10.0000 meq | INTRAVENOUS | Status: AC
  Administered 2019-12-05 (×6): 10 meq via INTRAVENOUS
  Filled 2019-12-05 (×6): qty 100

## 2019-12-05 MED ORDER — DIPHENHYDRAMINE HCL 50 MG/ML IJ SOLN
INTRAMUSCULAR | Status: AC
  Filled 2019-12-05: qty 1

## 2019-12-05 MED ORDER — PROMETHAZINE HCL 25 MG/ML IJ SOLN
12.5000 mg | Freq: Four times a day (QID) | INTRAMUSCULAR | Status: DC | PRN
  Administered 2019-12-06 – 2019-12-07 (×2): 12.5 mg via INTRAVENOUS
  Filled 2019-12-05 (×3): qty 1

## 2019-12-05 MED ORDER — LORAZEPAM 2 MG/ML IJ SOLN
2.0000 mg | Freq: Once | INTRAMUSCULAR | Status: AC
  Administered 2019-12-05: 2 mg via INTRAVENOUS
  Filled 2019-12-05: qty 1

## 2019-12-05 MED ORDER — DIPHENHYDRAMINE HCL 50 MG/ML IJ SOLN
12.5000 mg | Freq: Four times a day (QID) | INTRAMUSCULAR | Status: DC | PRN
  Administered 2019-12-07 – 2019-12-09 (×3): 12.5 mg via INTRAVENOUS
  Filled 2019-12-05 (×3): qty 1

## 2019-12-05 NOTE — Progress Notes (Signed)
Patient: Marie Clarke  Service Category: E/M  Provider: Gaspar Cola, MD  DOB: 11-Oct-1970  DOS: 12/07/2019  Location: Office  MRN: [id number]  Setting: Ambulatory outpatient  Referring Provider: Sharyne Peach, MD  Type: Established Patient  Specialty: Interventional Pain Management  PCP: Sharyne Peach, MD  Location: Remote location  Delivery: TeleHealth     Virtual Encounter - Pain Management PROVIDER NOTE: Information contained herein reflects review and annotations entered in association with encounter. Interpretation of such information and data should be left to medically-trained personnel. Information provided to patient can be located elsewhere in the medical record under "Patient Instructions". Document created using STT-dictation technology, any transcriptional errors that may result from process are unintentional.    Contact & Pharmacy Preferred: [phone] Home: [phone] (home) Mobile: [phone] (mobile) E-mail: [email or web address]  [place], [place] [facility] [place] [address] [place] [address] Phone: [phone] Fax: [phone]   Pre-screening  [name] offered "in-person" vs "virtual" encounter. She indicated preferring virtual for this encounter.   Reason COVID-19*  Social distancing based on CDC and AMA recommendations.   I contacted Marie Clarke on 12/07/2019 via telephone.      I clearly identified myself as Gaspar Cola, MD. I verified that I was speaking with the correct person using two identifiers (Name: Electra Paladino, and date of birth: [birth year]).  Consent I sought verbal advanced consent from [facility] for virtual visit interactions. I informed [name] of possible security and privacy concerns, risks, and limitations associated with providing "not-in-person" medical evaluation and management services. I also informed [name] of the availability of "in-person" appointments. Finally, I informed her that  there would be a charge for the virtual visit and that she could be  personally, fully or partially, financially responsible for it. Ms. Lipsky expressed understanding and agreed to proceed.   Historic Elements   Ms. Krystina Strieter is a 49 y.o. year old, female patient evaluated today after our last contact on 11/03/2019. Ms. Zucker  has a past medical history of Anemia, Anginal pain ([place]), Anxiety, Arthritis, Asthma, Bilateral lower extremity edema, Bipolar disorder ([place]), CAD (coronary artery disease) ([place]), CHF (congestive heart failure) ([place]), Chronic pain syndrome, COPD (chronic obstructive pulmonary disease) ([place]), DDD (degenerative disc disease), lumbar, Depression ([place]), Diabetes mellitus without complication ([place]), Diabetes mellitus, type II ([place]), Drug overdose (2015), Dysrhythmia, Fibromyalgia, GERD (gastroesophageal reflux disease), Headache, Hyperlipidemia, Hypertension, Hypothyroidism, Left leg pain (04/29/2014), MI (myocardial infarction) ([place]) (2007), Muscle ache (09/16/2014), Osteoporosis, Overactive bladder, Pancreatitis ([place]), Panic attacks, PTSD (post-traumatic stress disorder), Reflex sympathetic dystrophy, Renal cyst, left, Renal insufficiency, Restless legs syndrome, Sleep apnea (06/2019), Sleep apnea, Stroke ([place]) (2010), Suicide attempt [facility]), Thyroid disease, TIA (transient ischemic attack) ([place]), and TIA (transient ischemic attack). She also  has a past surgical history that includes prolapse rectum surgery (N/A, July 2016); Tonsillectomy; Abdominal hysterectomy; Hernia repair (07/15/2017); Cardiac catheterization; lumbar facet; and Thoracic laminectomy for spinal cord stimulator (N/A, 07/27/2019). Ms. Laramee has a current medication list which includes the following prescription(s): atorvastatin, b-complex with vitamin c, benztropine, buspirone, vitamin d3, darifenacin, desvenlafaxine, dicyclomine, famotidine, fluticasone, [START ON 12/17/2019] gabapentin, glipizide, hydroxyzine, klor-con m20,  levothyroxine, methocarbamol, metoprolol succinate, montelukast, naproxen, nitroglycerin, omeprazole, promethazine, quetiapine, quetiapine, sucralfate, topiramate, trelegy ellipta, trulicity, ventolin hfa, vitamin e, albuterol sulfate, gabapentin, meloxicam, rizatriptan, and torsemide, and the following Facility-Administered Medications: 0.9 % nacl with kcl 20 meq / l, diatrizoate meglumine-sodium, diphenhydramine, hydromorphone, insulin aspart, labetalol, ondansetron **OR** ondansetron, phenol, and promethazine. She  reports  that she quit smoking about 15 months ago. Her smoking use included cigarettes. She smoked 0.50 packs per day. She has never used smokeless tobacco. She reports that she does not drink alcohol and does not use drugs. Ms. Fahmy is allergic to diazepam, ziprasidone hcl, azithromycin, divalproex sodium, levofloxacin, lisinopril, metronidazole, sulfa antibiotics, valproic acid, cephalexin, ciprofloxacin, and penicillins.   HPI  Today, she is being contacted for medication management.  Today we have contacted the patient to evaluate how she is doing with the increase on the gabapentin 600 mg p.o. 3 times daily to 4 times daily.  Unfortunately, the patient is still in the hospital with a hernia and a bowel obstruction.  I asked her if they were planning on doing any kind of surgery and she indicated that she was not sure.  She indicates that she was able to tolerate it without any problems and she also seems to recall that at 1 time she was taking 1200 mg 3 times a day.  I asked her if she had any problems when she was taking that amount of medicine she indicated that she did not.  I asked her why she was not on it anymore and she indicated that it did not completely eliminate her pain.  I took this opportunity to make sure that she had adequate expectations and I told her that our goal was not necessarily to get 100% relief of the pain but to obtain at least 50% relief and to be able to increase  her level of activity.  Interestingly, the patient also indicates having been on Lyrica in the past and she thinks that the Lyrica worked a lot better than the Neurontin.  I have proposed to complete the full trial on the gabapentin and if she fails to get good relief at 800 mg 4 times daily, then we could definitely switch her to the Lyrica and do a similar trial until we can get to a maximum of 450 mg/day or some type of side effect stops this from getting there.  Of course, if we get good relief of the pain before then, we will also stop before we get to the maximum daily dose.  She understood and accepted.  If she continues to have problems with this, we may look at other options such as a diagnostic block of the lower extremity and perhaps a temporary or permanent peripheral nerve stimulator.  Pharmacotherapy Assessment  Analgesic: No opioid analgesics prescribed by our practice.I have discontinue all opioid analgesics on 09/09/2019 secondary to the patient's disregard towards our medication rules and regulations.  High-Risk due to history of a prior suicidal attempt w/ medications.  PMP revealed obtaining undisclosed opioid analgesics from several providers. MME/day:57m/day.   Monitoring: [place] PMP: PDMP reviewed during this encounter.       Pharmacotherapy: No side-effects or adverse reactions reported. Compliance: No problems identified. Effectiveness: Clinically acceptable. Plan: Refer to "POC".  UDS:  Summary  Date Value Ref Range Status  07/02/2019 Note  Final    Comment:    ==================================================================== [employer] Select 13 (MW) ==================================================================== Specimen Alert Note: Urinary creatinine is very low; ability to detect some drugs may be compromised; creatinine-normalized drug concentrations should be interpreted with caution. Suggest recollection.  (Creatinine) ==================================================================== Test                             Result       Flag  Units Drug Present and Declared for Prescription Verification   Tramadol                       >62500       EXPECTED   ng/mg creat   O-Desmethyltramadol            22813        EXPECTED   ng/mg creat   N-Desmethyltramadol            11550        EXPECTED   ng/mg creat    Source of tramadol is a prescription medication. O-desmethyltramadol    and N-desmethyltramadol are expected metabolites of tramadol. ==================================================================== Test                      Result    Flag   Units      Ref Range   Creatinine              8         LL     mg/dL      >=14 ==================================================================== Declared Medications:  The flagging and interpretation on this report are based on the  following declared medications.  Unexpected results may arise from  inaccuracies in the declared medications.  **Note: The testing scope of this panel includes these medications:  Tramadol  **Note: The testing scope of this panel does not include the  following reported medications:  Albuterol  Aspirin  Atorvastatin  Atropine (Lomotil)  Buspirone  Carvedilol (Coreg)  Celecoxib (Celebrex)  Colchicine  Cysteamine (Procysbi)  Darifenacin  Dicyclomine (Bentyl)  Diphenoxylate (Lomotil)  Dulaglutide  Eye Drops  Famotidine (Pepcid)  Fluticasone  Fluticasone (Trelegy)  Gabapentin  Hydrochlorothiazide  Hydroxyzine  Levocetirizine (Xyzal)  Levothyroxine  Magnesium (Mag-Ox)  Methocarbamol  Metoprolol  Montelukast (Singulair)  Naproxen  Nitroglycerin  Omeprazole  Ondansetron  Potassium  Prednisone  Pregabalin  Prochlorperazine (Compazine)  Promethazine  Quetiapine (Seroquel)  Rizatriptan (Maxalt)  Ropinirole (Requip)  Solifenacin (Vesicare)  Topiramate (Topamax)  Torsemide  Triamterene   Umeclidinium (Trelegy)  Vilanterol (Trelegy)  Vitamin B  Vitamin C  Vitamin D3  Vitamin E ==================================================================== For clinical consultation, please call [phone]. ====================================================================     Laboratory Chemistry Profile   Renal Lab Results  Component Value Date   BUN 16 12/07/2019   CREATININE 0.69 12/07/2019   BCR 16 09/09/2017   GFRAA >60 10/25/2019   GFRNONAA >60 12/07/2019     Hepatic Lab Results  Component Value Date   AST 22 10/25/2019   ALT 21 10/25/2019   ALBUMIN 3.4 (L) 12/04/2019   ALKPHOS 119 10/25/2019   LIPASE 38 10/25/2019     Electrolytes Lab Results  Component Value Date   NA 143 12/07/2019   K 3.6 12/07/2019   CL 108 12/07/2019   CALCIUM 8.8 (L) 12/07/2019   MG 2.3 12/07/2019   PHOS 3.2 12/07/2019     Bone Lab Results  Component Value Date   25OHVITD1 43 09/09/2017   25OHVITD2 <1.0 09/09/2017   25OHVITD3 43 09/09/2017     Inflammation (CRP: Acute Phase) (ESR: Chronic Phase) Lab Results  Component Value Date   CRP 21 (H) 09/09/2017   ESRSEDRATE 50 (H) 09/09/2017   LATICACIDVEN 1.8 12/02/2019       Note: Above Lab results reviewed.  Imaging  DG Abd 1 View CLINICAL DATA:  Abdominal pain.  EXAM: ABDOMEN - 1 VIEW  COMPARISON:  December 05, 2019  FINDINGS: There is stable nasogastric tube and spinal stimulator wire positioning. Stable, mildly dilated small bowel loops are seen. No radio-opaque calculi or other significant radiographic abnormality are seen.  IMPRESSION: Stable, mildly dilated small bowel loops consistent with a small bowel obstruction versus ileus.  Electronically Signed   By: Virgina Norfolk M.D.   On: 12/06/2019 18:33  Assessment  The primary encounter diagnosis was Chronic pain syndrome. Diagnoses of Chronic ankle pain (Tertiary Area of Pain) (Left), Chronic low back pain (Primary Area of Pain) (Bilateral)  (L>R), Chronic lower extremity pain (Referred) (Secondary Area of Pain) (Left), Neuropathic pain, Neurogenic pain, and Osteoarthritis involving multiple joints were also pertinent to this visit.  Plan of Care  Problem-specific:  No problem-specific Assessment & Plan notes found for this encounter.  Ms. Joanann Mies has a current medication list which includes the following long-term medication(s): atorvastatin, benztropine, desvenlafaxine, fluticasone, [START ON 12/17/2019] gabapentin, klor-con m20, methocarbamol, montelukast, nitroglycerin, quetiapine, quetiapine, gabapentin, meloxicam, rizatriptan, and torsemide.  Pharmacotherapy (Medications Ordered): Meds ordered this encounter  Medications  . meloxicam (MOBIC) 15 MG tablet    Sig: Take 1 tablet (15 mg total) by mouth daily.    Dispense:  30 tablet    Refill:  2    Do not add this medication to the electronic "Automatic Refill" notification system. Patient may have prescription filled one day early if pharmacy is closed on scheduled refill date.  . gabapentin (NEURONTIN) 600 MG tablet    Sig: Take 1 tablet (600 mg total) by mouth 3 (three) times daily.    Dispense:  90 tablet    Refill:  0    Fill one day early if pharmacy is closed on scheduled refill date. May substitute for generic, or similar, if available.  . gabapentin (NEURONTIN) 800 MG tablet    Sig: Take 1 tablet (800 mg total) by mouth at bedtime.    Dispense:  40 tablet    Refill:  0    Fill one day early if pharmacy is closed on scheduled refill date. May substitute for generic if available.   Orders:  No orders of the defined types were placed in this encounter.  Follow-up plan:   Return in about 6 weeks (around 01/16/2020) for (VV), (Med Mgmt) to evaluate the gabapentin titration.      Interventional management options:  Considering:   NOTE: The patient is medical psychology evaluation indicates that the patient is a "suitable candidate" for a spinal cord stimulator  trial. Possible lumbar spinal cord stimulator implant([place] done 04/22/2019.  She is currently pending implant.)  Diagnosticbilateral lumbar facet block Possible bilateral lumbar facetRFA Diagnostic left L5 TFESI Diagnostic left L5 SNRB Possible left L5 DRG RFA Diagnostic bilateral cervical facet block Possible bilateral cervical facetRFA Diagnosticleft IA shoulder injection Diagnosticleft suprascapular NB Diagnostic left suprascapularRFA Diagnostic left lumbar sympathetic block (LSB) #1  Possible left lumbar sympathetic RFA   Palliative PRN treatment(s):   Therapeutic/palliative left L5 TFESI #2 + right L4-5 LESI #2 (100/100/50/100) Palliative bilateral lumbar facet blocks Palliative right lumbar facet RFA #2 (last done 04/22/2018) Palliative left lumbar facet RFA #2 (last done 03/13/2018)      Recent Visits Date Type Provider Dept  11/17/19 Telemedicine Milinda Pointer, MD [facility]  10/22/19 Procedure visit Milinda Pointer, MD [facility]  10/06/19 Office Visit Milinda Pointer, MD [facility]  09/21/19 Office Visit Milinda Pointer, MD [facility]  09/09/19 Telemedicine Milinda Pointer, MD [facility]  Showing recent visits within  past 90 days and meeting all other requirements Today's Visits Date Type Provider Dept  12/07/19 Telemedicine Milinda Pointer, MD [facility]  Showing today's visits and meeting all other requirements Future Appointments No visits were found meeting these conditions. Showing future appointments within next 90 days and meeting all other requirements  I discussed the assessment and treatment plan with the patient. The patient was provided an opportunity to ask questions and all were answered. The patient agreed with the plan and demonstrated an understanding of the instructions.  Patient advised to call back or seek an in-person evaluation if the symptoms  or condition worsens.  Duration of encounter: 15 minutes.  Note by: Gaspar Cola, MD Date: 12/07/2019; Time: 11:14 AM

## 2019-12-05 NOTE — Progress Notes (Signed)
[facility] NAME: Marie Clarke    MR#:  [id number]  DATE OF BIRTH:  03/06/1970  SUBJECTIVE:   Patient feels better. Had multiple bowel movements. Wants to go home. She wants to get NG removed  Patient had significant amount of vomiting last night after NG tube was removed.  Continues to vomit. NG to be placed. REVIEW OF SYSTEMS:   Review of Systems  Constitutional: Negative for chills, fever and weight loss.  HENT: Negative for ear discharge, ear pain and nosebleeds.   Eyes: Negative for blurred vision, pain and discharge.  Respiratory: Negative for sputum production, shortness of breath, wheezing and stridor.   Cardiovascular: Negative for chest pain, palpitations, orthopnea and PND.  Gastrointestinal: Positive for abdominal pain and nausea. Negative for diarrhea and vomiting.  Genitourinary: Negative for frequency and urgency.  Musculoskeletal: Negative for back pain and joint pain.  Neurological: Positive for weakness. Negative for sensory change, speech change and focal weakness.  Psychiatric/Behavioral: Negative for depression and hallucinations. The patient is not nervous/anxious.    Tolerating Diet:npo Tolerating PT: not needed  DRUG ALLERGIES:   Allergies  Allergen Reactions  . Diazepam Hives and Nausea And Vomiting  . Ziprasidone Hcl Other (See Comments) and Itching    CONFUSED CONFUSED Other reaction(s): Other (See Comments) CONFUSED CONFUSED   . Azithromycin Hives  . Divalproex Sodium Hives  . Levofloxacin Hives and Rash    1/31: would like to retry since not taking valium  . Lisinopril     Medicine cause kidney failure  . Metronidazole Hives    1/31: would like to retry since she is no longer taking valium  . Sulfa Antibiotics Hives  . Valproic Acid Itching and Rash  . Cephalexin Hives  . Ciprofloxacin Hives  . Penicillins Hives    Has patient had a PCN reaction causing immediate rash,  facial/tongue/throat swelling, SOB or lightheadedness with hypotension: No Has patient had a PCN reaction causing severe rash involving mucus membranes or skin necrosis: No Has patient had a PCN reaction that required hospitalization: No Has patient had a PCN reaction occurring within the last 10 years: No If all of the above answers are "NO", then may proceed with Cephalosporin use.     VITALS:  Blood pressure 119/83, pulse 79, temperature 98.4 F (36.9 C), temperature source Oral, resp. rate 20, height 5\' 4"  (1.626 m), weight 102.1 kg, SpO2 93 %.  PHYSICAL EXAMINATION:   Physical Exam  GENERAL:  49 y.o.-year-old patient lying in the bed with no acute distress. Obese  HEENT: Head atraumatic, normocephalic. Oropharynx and nasopharynx clear. NG+ NECK:  Supple, no jugular venous distention. No thyroid enlargement, no tenderness.  LUNGS: Normal breath sounds bilaterally, no wheezing, rales, rhonchi. No use of accessory muscles of respiration.  CARDIOVASCULAR: S1, S2 normal. No murmurs, rubs, or gallops.  ABDOMEN: Soft, nontender, +distended. Well healed surgical present EXTREMITIES: No cyanosis, clubbing or edema b/l.    NEUROLOGIC: Cranial nerves II through XII are intact. No focal Motor or sensory deficits b/l.   PSYCHIATRIC:  patient is alert and oriented x 3.  SKIN: No obvious rash, lesion, or ulcer.   LABORATORY PANEL:  CBC Recent Labs  Lab 12/03/19 0410  WBC 2.7*  HGB 12.7  HCT 37.5  PLT 256    Chemistries  Recent Labs  Lab 12/05/19 0453  NA 137  K 2.7*  CL 98  CO2 27  GLUCOSE 158*  BUN 17  CREATININE  0.74  CALCIUM 8.8*  MG 2.3   Cardiac Enzymes No results for input(s): TROPONINI in the last 168 hours. RADIOLOGY:  DG Abd 1 View  Result Date: 12/04/2019 CLINICAL DATA:  Worsening pain and nausea EXAM: ABDOMEN - 1 VIEW COMPARISON:  12/04/2019, 12/02/2019, CT 12/02/2019 FINDINGS: Esophageal tube tip no longer visualized. Ascending thoracic stimulator leads.  Persistent gaseous dilatation of small bowel measuring up to 4.7 cm, without significant radiographic change. Pelvic calcifications. IMPRESSION: Persistent gaseous dilatation of small bowel, not significantly changed radiograph performed earlier today. Previously noted esophageal tube is not identified. Electronically Signed   By: Donavan Foil M.D.   On: 12/04/2019 23:41   DG Abd 1 View  Result Date: 12/04/2019 CLINICAL DATA:  Small bowel obstruction EXAM: ABDOMEN - 1 VIEW COMPARISON:  12/02/2019 FINDINGS: Focal gas-filled moderately distended small bowel in the left upper quadrant. Visualized small bowel loops demonstrate evidence of wall thickening. Changes consistent with small bowel obstruction. Enteric tube is present in the left upper quadrant consistent with location in the stomach. Spinal stimulator device. Surgical clips in the pelvis. Degenerative changes in the spine and hips. IMPRESSION: Dilated gas-filled small bowel with evidence of wall thickening suggesting small bowel obstruction. Electronically Signed   By: Lucienne Capers M.D.   On: 12/04/2019 06:58   ASSESSMENT AND PLAN:  49yo female with PMH for DM, HTN, hypothyroidism, CKD III, with severe abdominal surgeries (including abdominal hysterectomy, hernia repair, and most recently ventral hernia repair in Nov 2020). Pt presented to ED c/o vomiting (non bloody, non bilious) and abdominal pain x1 day. CT abdomen and pelvis showed SBO. NG tubed placed.    SBO (small bowel obstruction) ([place]), recurrent complex surgeries in the past -Patient presenting with abdominal pain and vomiting. -CT abdomen and pelvis showing small bowel obstruction suggesting adhesion as a cause -- NG to be placed again --had a couple bowel movements yesterday  -IV fluids, IV antiemetics, IV pain medication and supportive care -Surgical consult-- Dr. Celine Ahr-- recommends reinserting NG tube given persistent vomiting -IV PRN Zofran and Phenergan  AKI (acute  kidney injury) ([place]),  CKD stage IIIa hypokalemia -Likely prerenal secondary to acute process above -IV hydration and monitor renal function -pharmacy to replete electrolyte's    Essential (primary) hypertension -Labetalol IV as needed as patient is n.p.o.    Adult hypothyroidism -Hold levothyroxine for now    Controlled type 2 diabetes mellitus without complication (HCC) -Regular insulin sliding scale coverage    Chronic pain -IV pain meds as needed    DVT prophylaxis: SCDs.  Patient refusing Lovenox. Code Status: full code  Family Communication:  none Disposition Plan: Back to previous home environment Consults called: Surgery Status:At the time of admission, it appears that the appropriate admission status for this patient is INPATIENT. Patient continues to have significant NG output. Surgical consultation appreciated. Continue conservative management for now.     Dispo: The patient is from: Home              Anticipated d/c is to: Home              Anticipated d/c date is: to be determined given ongoing vomiting              Patient currently is not medically stable to d/c.  Patient continues to have significant vomiting. NG tube to be reinserted. Surgery following closely    TOTAL TIME TAKING CARE OF THIS PATIENT: 25 minutes.  >50% time spent on counselling and coordination of  care  Note: This dictation was prepared with Dragon dictation along with smaller phrase technology. Any transcriptional errors that result from this process are unintentional.  Fritzi Mandes M.D    [facility]   CC: Primary care physician; Sharyne Peach, MDPatient ID: [name], female   DOB: 12/10/1970, 49 y.o.   MRN: [id number]

## 2019-12-05 NOTE — Progress Notes (Signed)
[place] for Electrolyte Monitoring and Replacement   Recent Labs: Potassium (mmol/L)  Date Value  12/05/2019 2.7 (LL)  02/22/2014 2.9 (L)   Magnesium (mg/dL)  Date Value  12/05/2019 2.3   Calcium (mg/dL)  Date Value  12/05/2019 8.8 (L)   Calcium, Total (mg/dL)  Date Value  02/22/2014 8.7   Albumin (g/dL)  Date Value  12/04/2019 3.4 (L)  09/09/2017 4.6   Phosphorus (mg/dL)  Date Value  12/04/2019 2.9   Sodium (mmol/L)  Date Value  12/05/2019 137  09/09/2017 142  02/22/2014 141    Assessment: 49yo female with PMH for DM, HTN, hypothyroidism, CKD III, with severe abdominal surgeries (including abdominal hysterectomy, hernia repair, and most recently ventral hernia repair in Nov 2020). Pt presented to ED c/o vomiting (non bloody, non bilious) and abdominal pain x1 day. CT abdomen and pelvis showed SBO. NG tube clamped. Pharmacy has been consulted for electrolyte monitoring and replacement.   Goal of Therapy:  Electrolytes WNL  Plan:   KCl IV 83mEq x 6 IV ordered by medical team.   Monitor electrolytes daily with AM labs and supplement as needed  Oswald Hillock, PharmD, BCPS 12/05/2019 8:20 AM

## 2019-12-05 NOTE — Progress Notes (Signed)
[facility] SURGICAL PROGRESS NOTE (cpt [id number])  Hospital Day(s): 3.   Interval History: Yesterday, the patient passed a clamping trial with only 150 cc residual.  Her NG tube was subsequently removed.  Unfortunately, throughout the night, she had episodes of nausea and vomiting.  This morning, she is angrily and loudly declaring that she will not have the NG tube replaced.  She says that she is still having bowel movements, but that if I will not operate on her to "fix the problem," she wants to be transferred to [facility].  Review of Systems:  Constitutional: denies fever, chills  HEENT: denies cough or congestion  Respiratory: denies any shortness of breath  Cardiovascular: denies chest pain or palpitations  Gastrointestinal: denies abdominal pain, N/V, or diarrhea/and bowel function as per interval history Genitourinary: denies burning with urination or urinary frequency Musculoskeletal: denies pain, decreased motor or sensation  Vital signs in last 24 hours: [min-max] current  Temp:  [97.5 F (36.4 C)-98.6 F (37 C)] 97.8 F (36.6 C) (10/23 0840) Pulse Rate:  [86-102] 102 (10/23 0840) Resp:  [16-18] 18 (10/23 0335) BP: (104-124)/(72-95) 124/95 (10/23 0840) SpO2:  [91 %-96 %] 96 % (10/23 0840)     Height: 5\' 4"  (162.6 cm) Weight: 102.1 kg BMI (Calculated): 38.6   Intake/Output last 2 shifts:  10/22 0701 - 10/23 0700 In: 2808.1 [P.O.:120; I.V.:2670.4; IV Piggyback:17.7] Out: -    Physical Exam:  Constitutional: Alert, agitated HENT: normocephalic without obvious abnormality Eyes: PERRL, EOM's grossly intact and symmetric  Respiratory: Normal work of breathing on room air Cardiovascular: regular rate and sinus rhythm  Gastrointestinal: She declined to permit an abdominal exam today Musculoskeletal: No edema or wounds, motor and sensation grossly intact, NT    Labs:  CBC Latest Ref Rng & Units 12/03/2019 12/02/2019 10/25/2019  WBC 4.0 - 10.5 K/uL 2.7(L) 5.9 7.2   Hemoglobin 12.0 - 15.0 g/dL 12.7 13.4 11.9(L)  Hematocrit 36 - 46 % 37.5 39.9 34.9(L)  Platelets 150 - 400 K/uL 256 300 225   CMP Latest Ref Rng & Units 12/05/2019 12/04/2019 12/03/2019  Glucose 70 - 99 mg/dL 158(H) 140(H) -  BUN 6 - 20 mg/dL 17 24(H) -  Creatinine 0.44 - 1.00 mg/dL 0.74 0.85 -  Sodium 135 - 145 mmol/L 137 143 -  Potassium 3.5 - 5.1 mmol/L 2.7(LL) 2.9(L) 3.1(L)  Chloride 98 - 111 mmol/L 98 100 -  CO2 22 - 32 mmol/L 27 30 -  Calcium 8.9 - 10.3 mg/dL 8.8(L) 8.8(L) -  Total Protein 6.5 - 8.1 g/dL - - -  Total Bilirubin 0.3 - 1.2 mg/dL - - -  Alkaline Phos 38 - 126 U/L - - -  AST 15 - 41 U/L - - -  ALT 0 - 44 U/L - - -     Imaging studies:   No new imaging performed today   Assessment/Plan: (ICD-10's: K48.609) 49 y.o. female who as of yesterday, appeared to have resolved her small bowel obstruction.  Unfortunately, after removing her NG tube yesterday, she had multiple episodes of nausea and vomiting.  She is currently refusing to have the tube replaced.  She became quite angry with me as I tried to explain the justification for replacing it.  She insisted that she would not agree to have it placed and that she wanted to be transferred to [facility] so someone would operate on her.  I attempted to explain that this was not the recommended standard of care, however she remained adamant.   -  Due to recurrent nausea and vomiting, I have recommended that her NG tube be replaced.  Her bedside nurse and the hospitalist are working with her to try and convince her of its necessity.              - NPO + IVF resuscitation; limit consumption of ice chips/water             - Monitor abdominal examination; on-going bowel function             - Serial KUBs as needed              - Pain control prn (minimize narcotics); antiemetics prn             - Replete electrolytes; monitor  - Mobilization as tolerates              - No emergent surgical intervention; she understands that f she  were to fail conservative measures then she may require exploration.               - Further management per primary service; we will follow

## 2019-12-06 ENCOUNTER — Inpatient Hospital Stay: Payer: Medicare HMO

## 2019-12-06 LAB — GLUCOSE, CAPILLARY
Glucose-Capillary: 108 mg/dL — ABNORMAL HIGH (ref 70–99)
Glucose-Capillary: 82 mg/dL (ref 70–99)
Glucose-Capillary: 82 mg/dL (ref 70–99)
Glucose-Capillary: 85 mg/dL (ref 70–99)
Glucose-Capillary: 86 mg/dL (ref 70–99)
Glucose-Capillary: 97 mg/dL (ref 70–99)

## 2019-12-06 LAB — BASIC METABOLIC PANEL
Anion gap: 13 (ref 5–15)
BUN: 16 mg/dL (ref 6–20)
CO2: 28 mmol/L (ref 22–32)
Calcium: 9 mg/dL (ref 8.9–10.3)
Chloride: 100 mmol/L (ref 98–111)
Creatinine, Ser: 0.85 mg/dL (ref 0.44–1.00)
GFR, Estimated: >60 mL/min (ref >60)
Glucose, Bld: 106 mg/dL — ABNORMAL HIGH (ref 70–99)
Potassium: 2.8 mmol/L — ABNORMAL LOW (ref 3.5–5.1)
Sodium: 141 mmol/L (ref 135–145)

## 2019-12-06 MED ORDER — POTASSIUM CHLORIDE IN NACL 20-0.9 MEQ/L-% IV SOLN
INTRAVENOUS | Status: DC
  Filled 2019-12-06 (×7): qty 1000

## 2019-12-06 MED ORDER — POTASSIUM CHLORIDE 10 MEQ/100ML IV SOLN
10.0000 meq | INTRAVENOUS | Status: AC
  Administered 2019-12-06 (×4): 10 meq via INTRAVENOUS
  Filled 2019-12-06 (×4): qty 100

## 2019-12-06 MED ORDER — LABETALOL HCL 5 MG/ML IV SOLN: 5.0000 mg | INTRAVENOUS | Status: DC | PRN

## 2019-12-06 NOTE — Progress Notes (Signed)
PROGRESS NOTE    Marie Clarke  XAJ:[id number] DOB: 1971/01/28 DOA: 12/02/2019 PCP: Sharyne Peach, MD    Brief Narrative:  Marie Clarke is a 49 y.o. female with medical history significant for diabetes, hypertension, hypothyroidism, CKD 3, with history of several abdominal surgeries, who presents with a 1 day history of of abdominal pain, fever, and several episodes of bilious vomiting .  She denies cough or shortness of breath. NG tube was removed and patient has significant amount of vomiting and NG tube was replaced again    Consultants:   surgery  Procedures:   Antimicrobials:       Subjective: Feels "soreness of her abdomen ".  Still nauseous but no vomiting since NG tube was replaced again.  States +flatus  Objective: Vitals:   12/05/19 2017 12/05/19 2334 12/06/19 0512 12/06/19 0812  BP: (!) 125/96 119/82 126/85 119/82  Pulse: 90 92 96 94  Resp: 20 16 20 13   Temp: 98.5 F (36.9 C) 98.5 F (36.9 C) 98.3 F (36.8 C) (!) 97.4 F (36.3 C)  TempSrc: Oral Oral Oral   SpO2: 94% 93% 91% 94%  Weight:      Height:        Intake/Output Summary (Last 24 hours) at 12/06/2019 0916 Last data filed at 12/05/2019 2350 Gross per 24 hour  Intake 2000 ml  Output 900 ml  Net 1100 ml   Filed Weights   12/02/19 0850  Weight: 102.1 kg    Examination:  General exam: Appears calm and comfortable  HEENT: NGT inplace with bilious suctioning Respiratory system: Clear to auscultation. Respiratory effort normal. Cardiovascular system: S1 & S2 heard, RRR. No JVD, murmurs, rubs, gallops or clicks. No pedal edema. Gastrointestinal system: Abdomen is distended, decrease bs, does not want me to check for tenderness.  Central nervous system: Alert and oriented.  Grossly intact Extremities: No edema Skin: Warm dry Psychiatry:  Mood & affect appropriate.     Data Reviewed: I have personally reviewed following labs and imaging studies  CBC: Recent Labs  Lab 12/02/19 0902  12/03/19 0410  WBC 5.9 2.7*  HGB 13.4 12.7  HCT 39.9 37.5  MCV 86.6 87.2  PLT 300 094   Basic Metabolic Panel: Recent Labs  Lab 12/02/19 0902 12/02/19 0902 12/03/19 0410 12/03/19 1846 12/04/19 0542 12/04/19 0550 12/05/19 0453 12/06/19 0446  NA 136  --  139  --  143  --  137 141  K 3.1*   < > 2.8* 3.1* 2.9*  --  2.7* 2.8*  CL 99  --  98  --  100  --  98 100  CO2 21*  --  26  --  30  --  27 28  GLUCOSE 179*  --  163*  --  140*  --  158* 106*  BUN 21*  --  27*  --  24*  --  17 16  CREATININE 1.24*  --  1.19*  --  0.85  --  0.74 0.85  CALCIUM 9.1  --  8.7*  --  8.8*  --  8.8* 9.0  MG  --   --  2.0  --   --  2.2 2.3  --   PHOS  --   --  4.0  --  2.9  --   --   --    < > = values in this interval not displayed.   GFR: Estimated Creatinine Clearance: 95.2 mL/min (by C-G formula based on SCr of 0.85 mg/dL). Liver  Function Tests: Recent Labs  Lab 12/04/19 0542  ALBUMIN 3.4*   No results for input(s): LIPASE, AMYLASE in the last 168 hours. No results for input(s): AMMONIA in the last 168 hours. Coagulation Profile: No results for input(s): INR, PROTIME in the last 168 hours. Cardiac Enzymes: No results for input(s): CKTOTAL, CKMB, CKMBINDEX, TROPONINI in the last 168 hours. BNP (last 3 results) No results for input(s): PROBNP in the last 8760 hours. HbA1C: No results for input(s): HGBA1C in the last 72 hours. CBG: Recent Labs  Lab 12/05/19 1656 12/05/19 1948 12/05/19 2332 12/06/19 0503 12/06/19 0742  GLUCAP 102* 91 97 108* 97   Lipid Profile: No results for input(s): CHOL, HDL, LDLCALC, TRIG, CHOLHDL, LDLDIRECT in the last 72 hours. Thyroid Function Tests: No results for input(s): TSH, T4TOTAL, FREET4, T3FREE, THYROIDAB in the last 72 hours. Anemia Panel: No results for input(s): VITAMINB12, FOLATE, FERRITIN, TIBC, IRON, RETICCTPCT in the last 72 hours. Sepsis Labs: Recent Labs  Lab 12/02/19 1521  LATICACIDVEN 1.8    Recent Results (from the past 240  hour(s))  Respiratory Panel by RT PCR (Flu A&B, Covid) - Nasopharyngeal Swab     Status: None   Collection Time: 12/02/19  3:21 PM   Specimen: Nasopharyngeal Swab  Result Value Ref Range Status   SARS Coronavirus 2 by RT PCR NEGATIVE NEGATIVE Final    Comment: (NOTE) SARS-CoV-2 target nucleic acids are NOT DETECTED.  The SARS-CoV-2 RNA is generally detectable in upper respiratoy specimens during the acute phase of infection. The lowest concentration of SARS-CoV-2 viral copies this assay can detect is 131 copies/mL. A negative result does not preclude SARS-Cov-2 infection and should not be used as the sole basis for treatment or other patient management decisions. A negative result may occur with  improper specimen collection/handling, submission of specimen other than nasopharyngeal swab, presence of viral mutation(s) within the areas targeted by this assay, and inadequate number of viral copies (<131 copies/mL). A negative result must be combined with clinical observations, patient history, and epidemiological information. The expected result is Negative.  Fact Sheet for Patients:  PinkCheek.be  Fact Sheet for Healthcare Providers:  GravelBags.it  This test is no t yet approved or cleared by the [place] FDA and  has been authorized for detection and/or diagnosis of SARS-CoV-2 by FDA under an Emergency Use Authorization (EUA). This EUA will remain  in effect (meaning this test can be used) for the duration of the COVID-19 declaration under Section 564(b)(1) of the Act, 21 U.S.C. section 360bbb-3(b)(1), unless the authorization is terminated or revoked sooner.     Influenza A by PCR NEGATIVE NEGATIVE Final   Influenza B by PCR NEGATIVE NEGATIVE Final    Comment: (NOTE) The Xpert Xpress SARS-CoV-2/FLU/RSV assay is intended as an aid in  the diagnosis of influenza from Nasopharyngeal swab specimens and  should not  be used as a sole basis for treatment. Nasal washings and  aspirates are unacceptable for Xpert Xpress SARS-CoV-2/FLU/RSV  testing.  Fact Sheet for Patients: PinkCheek.be  Fact Sheet for Healthcare Providers: GravelBags.it  This test is not yet approved or cleared by the [place] FDA and  has been authorized for detection and/or diagnosis of SARS-CoV-2 by  FDA under an Emergency Use Authorization (EUA). This EUA will remain  in effect (meaning this test can be used) for the duration of the  Covid-19 declaration under Section 564(b)(1) of the Act, 21  U.S.C. section 360bbb-3(b)(1), unless the authorization is  terminated or revoked. Performed  at [facility], [address] [place], [place] [address]   Blood culture (routine single)     Status: None (Preliminary result)   Collection Time: 12/02/19  3:21 PM   Specimen: BLOOD  Result Value Ref Range Status   Specimen Description BLOOD RIGHT ANTECUBITAL  Final   Special Requests   Final    BOTTLES DRAWN AEROBIC AND ANAEROBIC Blood Culture results may not be optimal due to an excessive volume of blood received in culture bottles   Culture   Final    NO GROWTH 4 DAYS Performed at [facility], [address] [place], [place] [address]    Report Status PENDING  Incomplete         Radiology Studies: DG Abd 1 View  Result Date: 12/05/2019 CLINICAL DATA:  Vomiting.  Multiple bowel movements. EXAM: ABDOMEN - 1 VIEW COMPARISON:  12/04/2019 FINDINGS: NG tube in stomach. No dilated large or small bowel. Stimulator device in the central canal of the spine. IMPRESSION: NG tube in stomach.  No bowel obstruction. Electronically Signed   By: Suzy Bouchard M.D.   On: 12/05/2019 15:43   DG Abd 1 View  Result Date: 12/04/2019 CLINICAL DATA:  Worsening pain and nausea EXAM: ABDOMEN - 1 VIEW COMPARISON:  12/04/2019, 12/02/2019, CT 12/02/2019 FINDINGS:  Esophageal tube tip no longer visualized. Ascending thoracic stimulator leads. Persistent gaseous dilatation of small bowel measuring up to 4.7 cm, without significant radiographic change. Pelvic calcifications. IMPRESSION: Persistent gaseous dilatation of small bowel, not significantly changed radiograph performed earlier today. Previously noted esophageal tube is not identified. Electronically Signed   By: Donavan Foil M.D.   On: 12/04/2019 23:41        Scheduled Meds: . insulin aspart  0-15 Units Subcutaneous Q4H   Continuous Infusions: . 0.9 % NaCl with KCl 20 mEq / L    . potassium chloride      Assessment & Plan:   Principal Problem:   SBO (small bowel obstruction) (HCC) Active Problems:   Chronic kidney disease, stage III (moderate) (HCC)   Essential (primary) hypertension   Adult hypothyroidism   Diabetes mellitus, type 2 ([place])   Controlled type 2 diabetes mellitus without complication ([place])   Type 2 diabetes mellitus ([place])   AKI (acute kidney injury) ([place])   Chronic pain   Hypokalemia   49yo female with PMH for DM, HTN, hypothyroidism, CKD III, with severe abdominal surgeries (including abdominal hysterectomy, hernia repair, and most recently ventral hernia repair in Nov 2020). Pt presented to ED c/o vomiting (non bloody, non bilious) and abdominal pain x1 day. CT abdomen and pelvis showed SBO. NG tubed placed.   SBO (small bowel obstruction) ([place]), recurrent complex surgeries in the past CT abdomen and pelvis showing small bowel obstruction suggesting adhesion as a cause --Surgery was consulted -continue NG tube given persistent vomiting  Minimize narcotics  Surgery plans to do Gastrografin study in a.m. if her NG output remains high Continue n.p.o. and IV fluid resuscitation, limit consumption of ice chips/water per surgery Serial abdominal exam Serial KUBS as needed Mobilization as tolerated No emergent surgical intervention, if she fails conservative therapy  then she may require exploration   AKI (acute kidney injury) ([place]),  CKD stage IIIa hypokalemia -Likely prerenal secondary to acute process above K today 2.8, will replace Magnesium 2.3  Continue with IV fluids     Essential (primary) hypertension Currently stable Labetalol as needed as pt is NPO  Adult hypothyroidism -On levothyroxine on hold  since patient is n.p.o.    Controlled type 2 diabetes mellitus without complication (HCC) -BG stable.  Continue RISS  Chronic pain -iv pain meds prn   DVT prophylaxis: SCD Code Status:full Family Communication: None at bedside  Status is: Inpatient  Remains inpatient appropriate because:Inpatient level of care appropriate due to severity of illness   Dispo: The patient is from: Home              Anticipated d/c is to: Home              Anticipated d/c date is: > 3 days              Patient currently is not medically stable to d/c.Pt with vomiting, has NGT, no po intake yet, still diagnostic w/u            LOS: 4 days   Time spent: 35 minutes with more than 50% on [place], MD [facility] Pager [phone]  If 7PM-7AM, please contact night-coverage [email or web address] Password [email or web address] 12/06/2019, 9:16 AM

## 2019-12-06 NOTE — Progress Notes (Signed)
[place] Hospital Day(s): 4.   Interval History: Her NG tube was replaced yesterday with what is possibly the smallest nasogastric tube available in the hospital.  Per the nurse who placed it, approximately 1500 cc came out immediately.  There is currently 900 cc recorded in the patient's chart.  Her electrolytes are fairly deranged with a potassium of 2.8.  She continues to be angry at having the NG tube in place.  Review of Systems:  Constitutional: denies fever, chills  HEENT: denies cough or congestion  Respiratory: denies any shortness of breath  Cardiovascular: denies chest pain or palpitations  Gastrointestinal: denies abdominal pain, N/V, or diarrhea/and bowel function as per interval history Genitourinary: denies burning with urination or urinary frequency Musculoskeletal: denies pain, decreased motor or sensation  Vital signs in last 24 hours: [min-max] current  Temp:  [97.4 F (36.3 C)-98.5 F (36.9 C)] 97.4 F (36.3 C) (10/24 0812) Pulse Rate:  [79-96] 94 (10/24 0812) Resp:  [13-20] 13 (10/24 0812) BP: (119-126)/(82-96) 119/82 (10/24 0812) SpO2:  [91 %-94 %] 94 % (10/24 0812)     Height: 5\' 4"  (162.6 cm) Weight: 102.1 kg BMI (Calculated): 38.6   Intake/Output last 2 shifts:  10/23 0701 - 10/24 0700 In: 2000 [NG/GT:2000] Out: 900 [Emesis/NG output:900]   Physical Exam:  Constitutional: Alert, agitated HENT: normocephalic without obvious abnormality Eyes: PERRL, EOM's grossly intact and symmetric  Respiratory: Normal work of breathing on room air Cardiovascular: regular rate and sinus rhythm  Gastrointestinal: Soft, protuberant Musculoskeletal: No edema or wounds, motor and sensation grossly intact, NT    Labs:  CBC Latest Ref Rng & Units 12/03/2019 12/02/2019 10/25/2019  WBC 4.0 - 10.5 K/uL 2.7(L) 5.9 7.2  Hemoglobin 12.0 - 15.0 g/dL 12.7 13.4 11.9(L)  Hematocrit 36 - 46 % 37.5 39.9 34.9(L)  Platelets 150 - 400 K/uL  256 300 225   CMP Latest Ref Rng & Units 12/06/2019 12/05/2019 12/04/2019  Glucose 70 - 99 mg/dL 106(H) 158(H) 140(H)  BUN 6 - 20 mg/dL 16 17 24(H)  Creatinine 0.44 - 1.00 mg/dL 0.85 0.74 0.85  Sodium 135 - 145 mmol/L 141 137 143  Potassium 3.5 - 5.1 mmol/L 2.8(L) 2.7(LL) 2.9(L)  Chloride 98 - 111 mmol/L 100 98 100  CO2 22 - 32 mmol/L 28 27 30   Calcium 8.9 - 10.3 mg/dL 9.0 8.8(L) 8.8(L)  Total Protein 6.5 - 8.1 g/dL - - -  Total Bilirubin 0.3 - 1.2 mg/dL - - -  Alkaline Phos 38 - 126 U/L - - -  AST 15 - 41 U/L - - -  ALT 0 - 44 U/L - - -     Imaging studies:   No new imaging performed today   Assessment/Plan: (ICD-10's: K51.609) 49 y.o. female admitted with small bowel obstruction who initially passed a clamping trial but subsequently developed nausea and vomiting.  The NG tube was replaced yesterday and has fairly high output.  -We will likely consider a Gastrografin study tomorrow if her NG output remains high  - NPO + IVF resuscitation; limit consumption of ice chips/water             - Monitor abdominal examination; on-going bowel function             - Serial KUBs as needed              - Pain control prn (minimize narcotics); antiemetics prn             -  Replete electrolytes; monitor  - Mobilization as tolerates              - No emergent surgical intervention; she understands that f she were to fail conservative measures then she may require exploration.               - Further management per primary service; we will follow

## 2019-12-06 NOTE — Progress Notes (Signed)
[facility] for Electrolyte Monitoring and Replacement   Recent Labs: Potassium (mmol/L)  Date Value  12/06/2019 2.8 (L)  02/22/2014 2.9 (L)   Magnesium (mg/dL)  Date Value  12/05/2019 2.3   Calcium (mg/dL)  Date Value  12/06/2019 9.0   Calcium, Total (mg/dL)  Date Value  02/22/2014 8.7   Albumin (g/dL)  Date Value  12/04/2019 3.4 (L)  09/09/2017 4.6   Phosphorus (mg/dL)  Date Value  12/04/2019 2.9   Sodium (mmol/L)  Date Value  12/06/2019 141  09/09/2017 142  02/22/2014 141    Assessment: 49yo female with PMH for DM, HTN, hypothyroidism, CKD III, with severe abdominal surgeries (including abdominal hysterectomy, hernia repair, and most recently ventral hernia repair in Nov 2020). Pt presented to ED c/o vomiting (non bloody, non bilious) and abdominal pain x1 day. CT abdomen and pelvis showed SBO. NG tube removed. Pharmacy has been consulted for electrolyte monitoring and replacement.   Goal of Therapy:  Electrolytes WNL  Plan:   KCl IV 40mEq x 4 IV and will add KCl 20 mEq to IV fluid.   Monitor electrolytes daily with AM labs and supplement as needed  Oswald Hillock, PharmD, BCPS 12/06/2019 8:26 AM

## 2019-12-07 ENCOUNTER — Inpatient Hospital Stay (HOSPITAL_BASED_OUTPATIENT_CLINIC_OR_DEPARTMENT_OTHER): Payer: Medicare HMO | Admitting: Pain Medicine

## 2019-12-07 ENCOUNTER — Inpatient Hospital Stay: Payer: Medicare HMO

## 2019-12-07 DIAGNOSIS — G894 Chronic pain syndrome: Secondary | ICD-10-CM | POA: Diagnosis not present

## 2019-12-07 DIAGNOSIS — M79605 Pain in left leg: Secondary | ICD-10-CM | POA: Diagnosis not present

## 2019-12-07 DIAGNOSIS — M25572 Pain in left ankle and joints of left foot: Secondary | ICD-10-CM

## 2019-12-07 DIAGNOSIS — M8949 Other hypertrophic osteoarthropathy, multiple sites: Secondary | ICD-10-CM

## 2019-12-07 DIAGNOSIS — M545 Low back pain, unspecified: Secondary | ICD-10-CM | POA: Diagnosis not present

## 2019-12-07 DIAGNOSIS — M792 Neuralgia and neuritis, unspecified: Secondary | ICD-10-CM

## 2019-12-07 DIAGNOSIS — M159 Polyosteoarthritis, unspecified: Secondary | ICD-10-CM

## 2019-12-07 DIAGNOSIS — G8929 Other chronic pain: Secondary | ICD-10-CM

## 2019-12-07 LAB — BASIC METABOLIC PANEL
Anion gap: 10 (ref 5–15)
BUN: 16 mg/dL (ref 6–20)
CO2: 25 mmol/L (ref 22–32)
Calcium: 8.8 mg/dL — ABNORMAL LOW (ref 8.9–10.3)
Chloride: 108 mmol/L (ref 98–111)
Creatinine, Ser: 0.69 mg/dL (ref 0.44–1.00)
GFR, Estimated: >60 mL/min (ref >60)
Glucose, Bld: 105 mg/dL — ABNORMAL HIGH (ref 70–99)
Potassium: 3.6 mmol/L (ref 3.5–5.1)
Sodium: 143 mmol/L (ref 135–145)

## 2019-12-07 LAB — PHOSPHORUS: Phosphorus: 3.2 mg/dL (ref 2.5–4.6)

## 2019-12-07 LAB — GLUCOSE, CAPILLARY
Glucose-Capillary: 88 mg/dL (ref 70–99)
Glucose-Capillary: 91 mg/dL (ref 70–99)
Glucose-Capillary: 74 mg/dL (ref 70–99)

## 2019-12-07 LAB — CULTURE, BLOOD (SINGLE): Culture: NO GROWTH

## 2019-12-07 LAB — MAGNESIUM: Magnesium: 2.3 mg/dL (ref 1.7–2.4)

## 2019-12-07 MED ORDER — DIATRIZOATE MEGLUMINE & SODIUM 66-10 % PO SOLN
90.0000 mL | Freq: Once | ORAL | Status: AC
  Administered 2019-12-07: 90 mL via NASOGASTRIC

## 2019-12-07 MED ORDER — MELOXICAM 15 MG PO TABS: 15.0000 mg | ORAL_TABLET | Freq: Every day | ORAL | 2 refills | Status: DC

## 2019-12-07 MED ORDER — LEVOTHYROXINE SODIUM 100 MCG/5ML IV SOLN
175.0000 ug | Freq: Every day | INTRAVENOUS | Status: DC
Start: 2019-12-10 — End: 2019-12-07

## 2019-12-07 MED ORDER — GABAPENTIN 800 MG PO TABS
800.0000 mg | ORAL_TABLET | Freq: Every day | ORAL | 0 refills | Status: DC
Start: 2019-12-07 — End: 2019-12-27

## 2019-12-07 MED ORDER — KETOROLAC TROMETHAMINE 15 MG/ML IJ SOLN
15.0000 mg | Freq: Once | INTRAMUSCULAR | Status: AC
  Administered 2019-12-07: 15 mg via INTRAVENOUS
  Filled 2019-12-07: qty 1

## 2019-12-07 MED ORDER — POTASSIUM CHLORIDE 10 MEQ/100ML IV SOLN
10.0000 meq | Freq: Once | INTRAVENOUS | Status: AC
  Administered 2019-12-07: 10 meq via INTRAVENOUS
  Filled 2019-12-07: qty 100

## 2019-12-07 MED ORDER — GABAPENTIN 600 MG PO TABS: 600.0000 mg | ORAL_TABLET | Freq: Three times a day (TID) | ORAL | 0 refills | Status: DC

## 2019-12-07 MED ORDER — LEVOTHYROXINE SODIUM 100 MCG/5ML IV SOLN
88.0000 ug | Freq: Every day | INTRAVENOUS | Status: DC
  Administered 2019-12-07 – 2019-12-09 (×2): 88 ug via INTRAVENOUS
  Filled 2019-12-07 (×3): qty 5

## 2019-12-07 NOTE — Care Management Important Message (Signed)
Important Message  Patient Details  Name: Marie Clarke MRN: [id number] Date of Birth: Jan 10, 1971   Medicare Important Message Given:  Yes     Dannette Barbara 12/07/2019, 12:04 PM

## 2019-12-07 NOTE — Progress Notes (Signed)
[facility] NAME: Marie Clarke    MR#:  [id number]  DATE OF BIRTH:  Aug 29, 1970  SUBJECTIVE:   Patient out in the chair. NG tube output around 2500 mL. She is frustrated with the NG tube. Overall looks better.  Has had bowel movement. NPO. REVIEW OF SYSTEMS:   Review of Systems  Constitutional: Negative for chills, fever and weight loss.  HENT: Negative for ear discharge, ear pain and nosebleeds.   Eyes: Negative for blurred vision, pain and discharge.  Respiratory: Negative for sputum production, shortness of breath, wheezing and stridor.   Cardiovascular: Negative for chest pain, palpitations, orthopnea and PND.  Gastrointestinal: Positive for abdominal pain and nausea. Negative for diarrhea and vomiting.  Genitourinary: Negative for frequency and urgency.  Musculoskeletal: Negative for back pain and joint pain.  Neurological: Positive for weakness. Negative for sensory change, speech change and focal weakness.  Psychiatric/Behavioral: Negative for depression and hallucinations. The patient is not nervous/anxious.    Tolerating Diet:npo Tolerating PT: not needed  DRUG ALLERGIES:   Allergies  Allergen Reactions  . Diazepam Hives and Nausea And Vomiting  . Ziprasidone Hcl Other (See Comments) and Itching    CONFUSED CONFUSED Other reaction(s): Other (See Comments) CONFUSED CONFUSED   . Azithromycin Hives  . Divalproex Sodium Hives  . Levofloxacin Hives and Rash    1/31: would like to retry since not taking valium  . Lisinopril     Medicine cause kidney failure  . Metronidazole Hives    1/31: would like to retry since she is no longer taking valium  . Sulfa Antibiotics Hives  . Valproic Acid Itching and Rash  . Cephalexin Hives  . Ciprofloxacin Hives  . Penicillins Hives    Has patient had a PCN reaction causing immediate rash, facial/tongue/throat swelling, SOB or lightheadedness with hypotension: No Has patient  had a PCN reaction causing severe rash involving mucus membranes or skin necrosis: No Has patient had a PCN reaction that required hospitalization: No Has patient had a PCN reaction occurring within the last 10 years: No If all of the above answers are "NO", then may proceed with Cephalosporin use.     VITALS:  Blood pressure 122/80, pulse 93, temperature 99.8 F (37.7 C), temperature source Oral, resp. rate 20, height 5\' 4"  (1.626 m), weight 102.1 kg, SpO2 94 %.  PHYSICAL EXAMINATION:   Physical Exam  GENERAL:  49 y.o.-year-old patient lying in the bed with no acute distress. Obese  HEENT: Head atraumatic, normocephalic. Oropharynx and nasopharynx clear. NG+ NECK:  Supple, no jugular venous distention. No thyroid enlargement, no tenderness.  LUNGS: Normal breath sounds bilaterally, no wheezing, rales, rhonchi. No use of accessory muscles of respiration.  CARDIOVASCULAR: S1, S2 normal. No murmurs, rubs, or gallops.  ABDOMEN: Soft, nontender, +distended. Well healed surgical present EXTREMITIES: No cyanosis, clubbing or edema b/l.    NEUROLOGIC: Cranial nerves II through XII are intact. No focal Motor or sensory deficits b/l.   PSYCHIATRIC:  patient is alert and oriented x 3.  SKIN: No obvious rash, lesion, or ulcer.   LABORATORY PANEL:  CBC Recent Labs  Lab 12/03/19 0410  WBC 2.7*  HGB 12.7  HCT 37.5  PLT 256    Chemistries  Recent Labs  Lab 12/07/19 0457  NA 143  K 3.6  CL 108  CO2 25  GLUCOSE 105*  BUN 16  CREATININE 0.69  CALCIUM 8.8*  MG 2.3   Cardiac Enzymes No  results for input(s): TROPONINI in the last 168 hours. RADIOLOGY:  DG Abd 1 View  Result Date: 12/06/2019 CLINICAL DATA:  Abdominal pain. EXAM: ABDOMEN - 1 VIEW COMPARISON:  December 05, 2019 FINDINGS: There is stable nasogastric tube and spinal stimulator wire positioning. Stable, mildly dilated small bowel loops are seen. No radio-opaque calculi or other significant radiographic abnormality are  seen. IMPRESSION: Stable, mildly dilated small bowel loops consistent with a small bowel obstruction versus ileus. Electronically Signed   By: Virgina Norfolk M.D.   On: 12/06/2019 18:33   DG Abd 1 View  Result Date: 12/05/2019 CLINICAL DATA:  Vomiting.  Multiple bowel movements. EXAM: ABDOMEN - 1 VIEW COMPARISON:  12/04/2019 FINDINGS: NG tube in stomach. No dilated large or small bowel. Stimulator device in the central canal of the spine. IMPRESSION: NG tube in stomach.  No bowel obstruction. Electronically Signed   By: Suzy Bouchard M.D.   On: 12/05/2019 15:43   ASSESSMENT AND PLAN:  49yo female with PMH for DM, HTN, hypothyroidism, CKD III, with severe abdominal surgeries (including abdominal hysterectomy, hernia repair, and most recently ventral hernia repair in Nov 2020). Pt presented to ED c/o vomiting (non bloody, non bilious) and abdominal pain x1 day. CT abdomen and pelvis showed SBO. NG tubed placed.    SBO (small bowel obstruction) ([place]), recurrent complex surgeries in the past -Patient presenting with abdominal pain and vomiting. -CT abdomen and pelvis showing small bowel obstruction suggesting adhesion as a cause -- 10/23--NG to be placed again --had a couple bowel movements yesterday  -IV fluids, IV antiemetics, IV pain medication and supportive care -10/25-- Dr. Celine Ahr surgery -- recommends gastrographin study today. -IV PRN Zofran and Phenergan  AKI (acute kidney injury) ([place]),  CKD stage IIIa hypokalemia -Likely prerenal secondary to acute process above -IV hydration and monitor renal function -pharmacy to replete electrolyte's -labs overall stable and improved    Essential (primary) hypertension -Labetalol IV as needed as patient is n.p.o.    Adult hypothyroidism -Hold levothyroxine for now    Controlled type 2 diabetes mellitus without complication (HCC) -Regular insulin sliding scale coverage    Chronic pain -IV pain meds as needed    DVT  prophylaxis: SCDs.  Patient refusing Lovenox. Code Status: full code  Family Communication:  none Disposition Plan: Back to previous home environment Consults called: Surgery Status:At the time of admission, it appears that the appropriate admission status for this patient is INPATIENT. Patient continues to have significant NG output. Surgical consultation appreciated. Continue conservative management for now. Gastrograffin study to be done today     Dispo: The patient is from: Home              Anticipated d/c is to: Home              Anticipated d/c date is: to be determined given ongoing vomiting              Patient currently is not medically stable to d/c.  Patient continues to have significant vomiting. NG tube to be reinserted. Surgery following closely    TOTAL TIME TAKING CARE OF THIS PATIENT: 25 minutes.  >50% time spent on counselling and coordination of care  Note: This dictation was prepared with Dragon dictation along with smaller phrase technology. Any transcriptional errors that result from this process are unintentional.  Fritzi Mandes M.D    [facility]   CC: Primary care physician; Sharyne Peach, MDPatient ID: Marie Clarke, female   DOB:  1970-08-28, 49 y.o.   MRN: [id number]

## 2019-12-07 NOTE — Progress Notes (Signed)
[facility] for Electrolyte Monitoring and Replacement   Recent Labs: Potassium (mmol/L)  Date Value  12/07/2019 3.6  02/22/2014 2.9 (L)   Magnesium (mg/dL)  Date Value  12/07/2019 2.3   Calcium (mg/dL)  Date Value  12/07/2019 8.8 (L)   Calcium, Total (mg/dL)  Date Value  02/22/2014 8.7   Albumin (g/dL)  Date Value  12/04/2019 3.4 (L)  09/09/2017 4.6   Phosphorus (mg/dL)  Date Value  12/07/2019 3.2   Sodium (mmol/L)  Date Value  12/07/2019 143  09/09/2017 142  02/22/2014 141    Assessment: 49yo female with PMH for DM, HTN, hypothyroidism, CKD III, with severe abdominal surgeries (including abdominal hysterectomy, hernia repair, and most recently ventral hernia repair in Nov 2020). Pt presented to ED c/o vomiting (non bloody, non bilious) and abdominal pain x1 day. CT abdomen and pelvis showed SBO. NG tube removed. Pharmacy has been consulted for electrolyte monitoring and replacement.   K 3.6-Potassium is at lower end of normal limit.   Goal of Therapy:  Electrolytes WNL  Plan:   KCl IV 80mEq x 1 IV and no changes to MVIF with potassium.   Monitor electrolytes daily with AM labs and supplement as needed  Rowland Lathe, PharmD 12/07/2019 7:31 AM

## 2019-12-07 NOTE — Progress Notes (Addendum)
[facility] SURGICAL PROGRESS NOTE (cpt [phone])  Hospital Day(s): 5.   Interval History: Patient seen and examined, no acute events or new complaints overnight. Patient reports she continues to have periumbilical abdominal pain without improvement. She denied denies fever, chills, nausea, emesis. Her labs are reassuring this morning as she is without leukocytosis, renal function remains normal, and her electrolyte derangements are resolved. NGT output remains high at 2.5L. She does have BMs x2 recorded  Review of Systems:  Constitutional: denies fever, chills  HEENT: denies cough or congestion  Respiratory: denies any shortness of breath  Cardiovascular: denies chest pain or palpitations  Gastrointestinal: + abdominal pain, denied N/V, diarrhea/and bowel function as per interval history Genitourinary: denies burning with urination or urinary frequency Musculoskeletal: denies pain, decreased motor or sensation  Vital signs in last 24 hours: [min-max] current  Temp:  [97.4 F (36.3 C)-98.6 F (37 C)] 98.6 F (37 C) (10/25 0402) Pulse Rate:  [85-94] 89 (10/25 0402) Resp:  [13-20] 20 (10/25 0402) BP: (111-127)/(70-82) 127/76 (10/25 0402) SpO2:  [94 %-96 %] 94 % (10/25 0402)     Height: 5\' 4"  (162.6 cm) Weight: 102.1 kg BMI (Calculated): 38.6   Intake/Output last 2 shifts:  10/24 0701 - 10/25 0700 In: 775.2 [I.V.:393.2; IV Piggyback:382] Out: 2550 [Emesis/NG output:2550]   Physical Exam:  Constitutional: alert, cooperative and no distress HENT: normocephalic without obvious abnormality, NGT in place Eyes: PERRL, EOM's grossly intact and symmetric  Respiratory: breathing non-labored at rest  Cardiovascular: regular rate and sinus rhythm  Gastrointestinal: Soft, periumbilical soreness, and non-distended, no rebound/guarding, surgical scar present Musculoskeletal: No edema or wounds, motor and sensation grossly intact, NT   Labs:  CBC Latest Ref Rng & Units 12/03/2019  12/02/2019 10/25/2019  WBC 4.0 - 10.5 K/uL 2.7(L) 5.9 7.2  Hemoglobin 12.0 - 15.0 g/dL 12.7 13.4 11.9(L)  Hematocrit 36 - 46 % 37.5 39.9 34.9(L)  Platelets 150 - 400 K/uL 256 300 225   CMP Latest Ref Rng & Units 12/07/2019 12/06/2019 12/05/2019  Glucose 70 - 99 mg/dL 105(H) 106(H) 158(H)  BUN 6 - 20 mg/dL 16 16 17   Creatinine 0.44 - 1.00 mg/dL 0.69 0.85 0.74  Sodium 135 - 145 mmol/L 143 141 137  Potassium 3.5 - 5.1 mmol/L 3.6 2.8(L) 2.7(LL)  Chloride 98 - 111 mmol/L 108 100 98  CO2 22 - 32 mmol/L 25 28 27   Calcium 8.9 - 10.3 mg/dL 8.8(L) 9.0 8.8(L)  Total Protein 6.5 - 8.1 g/dL - - -  Total Bilirubin 0.3 - 1.2 mg/dL - - -  Alkaline Phos 38 - 126 U/L - - -  AST 15 - 41 U/L - - -  ALT 0 - 44 U/L - - -     Imaging studies: No new pertinent imaging studies   Assessment/Plan: (ICD-10's: K64.609) 49 y.o. 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 She does appear to have recurrence of ventral hernia which contains fat and loop of small bowel, but this does NOT appear to be the transition point and there is no evidence of bowel compromise in her work up nor on her examination.   - Continue NGT decompression; LIS; output remains high despite bowel function. I will evaluate with gastrografin study today  - NPO + IVF resuscitation; limit consumption of ice chips/water - Monitor abdominal examination; on-going bowel function - Serial KUBs as needed - Pain control prn (minimize narcotics); antiemetics prn   -  Monitor renal function / electrolytes while NPO; replete as needed   - Mobilization as tolerates  - No emergent surgical intervention; she understands that f she were to fail conservative measures then she may require exploration. - Further management per primary service; we will follow   All of the above  findings and recommendations were discussed with the patient, and the medical team, and all of patient's questions were answered to her expressed satisfaction.  -- Edison Simon, PA-C [facility] 12/07/2019, 7:54 AM [phone] M-F: 7am - 4pm  I saw and evaluated the patient.  I agree with the above documentation, exam, and plan, which I have edited where appropriate. Fredirick Maudlin  10:37 AM

## 2019-12-07 NOTE — Progress Notes (Signed)
Patient's sugar was checked and it was in 70's. She then took a glucose tablet from her purse in front of the tech, [place]. The tech let me know so I went into patient's room and discussed with her that she cannot be taking anything that we do not provide for her at the hospital. We also discussed her sending her purse home as it also has vapes in it, though she stated she was not using them.

## 2019-12-07 NOTE — Progress Notes (Signed)
Mobility Specialist - Progress Note   12/07/19 1500  Mobility  Range of Motion/Exercises All extremities (slr, heel slides, hip add/abd, quad sets, crossover reach)  Level of Assistance Standby assist, set-up cues, supervision of patient - no hands on  Assistive Device None  Distance Ambulated (ft) 0 ft  Mobility Response Tolerated well  Mobility performed by Mobility specialist  $Mobility charge 1 Mobility    Pre-mobility: 81 HR, 93% SpO2 Post-mobility: 86 HR, 97% SpO2   Pt was sitting in recliner upon arrival with aunt present in room. Pt agreed to session. Pt denied nausea or fatigue this date, but stated she does experience pain in L ankle. Pt declined OOB activity d/t not having walking boot present in room, however, aunt volunteered to bring boot later this evening. Pt stated that she's "able to tolerate some pressure on L ankle but not much." Pt was able to perform seated exercises: heel slides, straight leg raises, hip abd/add, quad sets, cross-over reach, and arm raises with no physical assistance. Overall, pt tolerated session well. Pt remains in recliner with all needs in reach.    Kathee Delton Mobility Specialist 12/07/19, 3:22 PM

## 2019-12-08 ENCOUNTER — Inpatient Hospital Stay: Payer: Medicare HMO

## 2019-12-08 LAB — RENAL FUNCTION PANEL
Albumin: 3.1 g/dL — ABNORMAL LOW (ref 3.5–5.0)
Anion gap: 12 (ref 5–15)
BUN: 14 mg/dL (ref 6–20)
CO2: 20 mmol/L — ABNORMAL LOW (ref 22–32)
Calcium: 8.7 mg/dL — ABNORMAL LOW (ref 8.9–10.3)
Chloride: 112 mmol/L — ABNORMAL HIGH (ref 98–111)
Creatinine, Ser: 0.71 mg/dL (ref 0.44–1.00)
GFR, Estimated: >60 mL/min (ref >60)
Glucose, Bld: 90 mg/dL (ref 70–99)
Phosphorus: 4.1 mg/dL (ref 2.5–4.6)
Potassium: 4.6 mmol/L (ref 3.5–5.1)
Sodium: 144 mmol/L (ref 135–145)

## 2019-12-08 LAB — GLUCOSE, CAPILLARY
Glucose-Capillary: 111 mg/dL — ABNORMAL HIGH (ref 70–99)
Glucose-Capillary: 59 mg/dL — ABNORMAL LOW (ref 70–99)
Glucose-Capillary: 72 mg/dL (ref 70–99)
Glucose-Capillary: 75 mg/dL (ref 70–99)
Glucose-Capillary: 79 mg/dL (ref 70–99)
Glucose-Capillary: 85 mg/dL (ref 70–99)
Glucose-Capillary: 88 mg/dL (ref 70–99)
Glucose-Capillary: 177 mg/dL — ABNORMAL HIGH (ref 70–99)

## 2019-12-08 LAB — MAGNESIUM: Magnesium: 2.2 mg/dL (ref 1.7–2.4)

## 2019-12-08 MED ORDER — DEXTROSE 50 % IV SOLN
INTRAVENOUS | Status: AC
  Filled 2019-12-08: qty 50

## 2019-12-08 MED ORDER — DEXTROSE 50 % IV SOLN
12.5000 g | Freq: Once | INTRAVENOUS | Status: AC
  Administered 2019-12-08: 12.5 g via INTRAVENOUS

## 2019-12-08 NOTE — Progress Notes (Signed)
[facility] for Electrolyte Monitoring and Replacement   Recent Labs: Potassium (mmol/L)  Date Value  12/08/2019 4.6  02/22/2014 2.9 (L)   Magnesium (mg/dL)  Date Value  12/08/2019 2.2   Calcium (mg/dL)  Date Value  12/08/2019 8.7 (L)   Calcium, Total (mg/dL)  Date Value  02/22/2014 8.7   Albumin (g/dL)  Date Value  12/08/2019 3.1 (L)  09/09/2017 4.6   Phosphorus (mg/dL)  Date Value  12/08/2019 4.1   Sodium (mmol/L)  Date Value  12/08/2019 144  09/09/2017 142  02/22/2014 141    Assessment: 49yo female with PMH for DM, HTN, hypothyroidism, CKD III, with severe abdominal surgeries (including abdominal hysterectomy, hernia repair, and most recently ventral hernia repair in Nov 2020). Pt presented to ED c/o vomiting (non bloody, non bilious) and abdominal pain x1 day. CT abdomen and pelvis showed SBO. NG tube removed. Pharmacy has been consulted for electrolyte monitoring and replacement.   K 4.6-Remains on NS + KCL 20 mEq continuous infusion  Goal of Therapy:  Electrolytes WNL  Plan:   No electrolyte replacement needed at this time.  Monitor electrolytes daily with AM labs and supplement as needed  Rowland Lathe, PharmD 12/08/2019 7:30 AM

## 2019-12-08 NOTE — Progress Notes (Signed)
NG tube residual checked and none noted. Per Dr. Standley Brooking pt ok to start on clear liquids and remove NG tube.   Thresa Ross

## 2019-12-08 NOTE — Progress Notes (Signed)
[facility] NAME: Marie Clarke    MR#:  [id number]  DATE OF BIRTH:  19-Oct-1970  SUBJECTIVE:   Patient out in the chair. NG tube output around 2500 mL. She is frustrated with the NG tube. Overall looks better.  NG clamped per sx this am Has had multiple bowel movement. NPO. Ambulating in the halls REVIEW OF SYSTEMS:   Review of Systems  Constitutional: Negative for chills, fever and weight loss.  HENT: Negative for ear discharge, ear pain and nosebleeds.   Eyes: Negative for blurred vision, pain and discharge.  Respiratory: Negative for sputum production, shortness of breath, wheezing and stridor.   Cardiovascular: Negative for chest pain, palpitations, orthopnea and PND.  Gastrointestinal: Positive for abdominal pain and nausea. Negative for diarrhea and vomiting.  Genitourinary: Negative for frequency and urgency.  Musculoskeletal: Negative for back pain and joint pain.  Neurological: Positive for weakness. Negative for sensory change, speech change and focal weakness.  Psychiatric/Behavioral: Negative for depression and hallucinations. The patient is not nervous/anxious.    Tolerating Diet:npo Tolerating PT: not needed  DRUG ALLERGIES:   Allergies  Allergen Reactions  . Diazepam Hives and Nausea And Vomiting  . Ziprasidone Hcl Other (See Comments) and Itching    CONFUSED CONFUSED Other reaction(s): Other (See Comments) CONFUSED CONFUSED   . Azithromycin Hives  . Divalproex Sodium Hives  . Levofloxacin Hives and Rash    1/31: would like to retry since not taking valium  . Lisinopril     Medicine cause kidney failure  . Metronidazole Hives    1/31: would like to retry since she is no longer taking valium  . Sulfa Antibiotics Hives  . Valproic Acid Itching and Rash  . Cephalexin Hives  . Ciprofloxacin Hives  . Penicillins Hives    Has patient had a PCN reaction causing immediate rash, facial/tongue/throat  swelling, SOB or lightheadedness with hypotension: No Has patient had a PCN reaction causing severe rash involving mucus membranes or skin necrosis: No Has patient had a PCN reaction that required hospitalization: No Has patient had a PCN reaction occurring within the last 10 years: No If all of the above answers are "NO", then may proceed with Cephalosporin use.     VITALS:  Blood pressure 130/87, pulse 69, temperature 97.9 F (36.6 C), resp. rate 15, height 5\' 4"  (1.626 m), weight 102.1 kg, SpO2 97 %.  PHYSICAL EXAMINATION:   Physical Exam  GENERAL:  49 y.o.-year-old patient lying in the bed with no acute distress. Obese  HEENT: Head atraumatic, normocephalic. Oropharynx and nasopharynx clear. NG+ NECK:  Supple, no jugular venous distention. No thyroid enlargement, no tenderness.  LUNGS: Normal breath sounds bilaterally, no wheezing, rales, rhonchi. No use of accessory muscles of respiration.  CARDIOVASCULAR: S1, S2 normal. No murmurs, rubs, or gallops.  ABDOMEN: Soft, nontender, +distended. Well healed surgical present EXTREMITIES: No cyanosis, clubbing or edema b/l.    NEUROLOGIC: Cranial nerves II through XII are intact. No focal Motor or sensory deficits b/l.   PSYCHIATRIC:  patient is alert and oriented x 3.  SKIN: No obvious rash, lesion, or ulcer.   LABORATORY PANEL:  CBC Recent Labs  Lab 12/03/19 0410  WBC 2.7*  HGB 12.7  HCT 37.5  PLT 256    Chemistries  Recent Labs  Lab 12/08/19 0630  NA 144  K 4.6  CL 112*  CO2 20*  GLUCOSE 90  BUN 14  CREATININE 0.71  CALCIUM 8.7*  MG 2.2   Cardiac Enzymes No results for input(s): TROPONINI in the last 168 hours. RADIOLOGY:  DG Abd 1 View  Result Date: 12/06/2019 CLINICAL DATA:  Abdominal pain. EXAM: ABDOMEN - 1 VIEW COMPARISON:  December 05, 2019 FINDINGS: There is stable nasogastric tube and spinal stimulator wire positioning. Stable, mildly dilated small bowel loops are seen. No radio-opaque calculi or other  significant radiographic abnormality are seen. IMPRESSION: Stable, mildly dilated small bowel loops consistent with a small bowel obstruction versus ileus. Electronically Signed   By: Virgina Norfolk M.D.   On: 12/06/2019 18:33   DG Abd Portable 1V-Small Bowel Obstruction Protocol-initial, 8 hr delay  Result Date: 12/07/2019 CLINICAL DATA:  Delay for small-bowel obstruction EXAM: PORTABLE ABDOMEN - 1 VIEW COMPARISON:  None. FINDINGS: The bowel gas pattern is normal. Contrast is seen throughout the colon to the level of the sigmoid rectal junction. NG tube is seen within the mid body of the stomach. No radio-opaque calculi or other significant radiographic abnormality are seen. IMPRESSION: Contrast to level of the sigmoid rectal junction. No dilated loops of bowel. Electronically Signed   By: Prudencio Pair M.D.   On: 12/07/2019 20:47   ASSESSMENT AND PLAN:  49yo female with PMH for DM, HTN, hypothyroidism, CKD III, with severe abdominal surgeries (including abdominal hysterectomy, hernia repair, and most recently ventral hernia repair in Nov 2020). Pt presented to ED c/o vomiting (non bloody, non bilious) and abdominal pain x1 day. CT abdomen and pelvis showed SBO. NG tubed placed.    SBO (small bowel obstruction) ([place]), recurrent complex surgeries in the past -Patient presenting with abdominal pain and vomiting. -CT abdomen and pelvis showing small bowel obstruction suggesting adhesion as a cause -- 10/23--NG to be placed again --had a couple bowel movements yesterday  -IV fluids, IV antiemetics, IV pain medication and supportive care -10/25-- Dr. Celine Ahr surgery -- recommends gastrographin study--no obstruction noted. -IV PRN Zofran and Phenergan -10/26--NG clamped per sx  AKI (acute kidney injury) ([place]),  CKD stage IIIa hypokalemia -Likely prerenal secondary to acute process above -recieving IV hydration  -pharmacy  repleted electrolyte's -labs overall stable and improved    Essential  (primary) hypertension -Labetalol IV as needed as patient is n.p.o.    Adult hypothyroidism -IV levothyroxine for now    Controlled type 2 diabetes mellitus without complication (HCC) with low sugar--asymptomatic -Regular insulin sliding scale coverage    Chronic pain -IV pain meds as needed    DVT prophylaxis: SCDs.  Patient refusing Lovenox. Code Status: full code  Family Communication:  none Disposition Plan: Back to previous home environment Consults called: Surgery Status:At the time of admission, it appears that the appropriate admission status for this patient is INPATIENT. Patient continues to have significant NG output. Surgical consultation appreciated. Continue conservative management for now. NG claim today. If not much residual clear liquid will be started and hopefully discharge October 27    Dispo: The patient is from: Home              Anticipated d/c is to: Home              Anticipated d/c date is: likely oct 27th              Patient currently is not medically stable to d/c.     TOTAL TIME TAKING CARE OF THIS PATIENT: 22 minutes.  >50% time spent on counselling and coordination of care  Note: This dictation was prepared with Dragon dictation along with  smaller phrase technology. Any transcriptional errors that result from this process are unintentional.  Fritzi Mandes M.D    [facility]   CC: Primary care physician; Sharyne Peach, MDPatient ID: [name], female   DOB: 1970/04/16, 49 y.o.   MRN: [id number]

## 2019-12-08 NOTE — Progress Notes (Signed)
[facility] SURGICAL PROGRESS NOTE (cpt [phone])  Hospital Day(s): 6.   Interval History: Patient seen and examined, no acute events or new complaints overnight. Patient reports she is feeling much better this morning and no longer with nausea or abdominal pain. She denies fever, chills, emesis. Renal function remains normal, sCr 0.71, UO not recorded. No electrolyte significant derangements. NGT output remains high at 3.2L however I am not sure she is being honesty with her degree of PO intake. She did undergo gastrografin challenge yesterday which shows contrast at the level of the rectum. She has had multiple BMs in last 24 hours.   Review of Systems:  Constitutional: denies fever, chills  HEENT: denies cough or congestion  Respiratory: denies any shortness of breath  Cardiovascular: denies chest pain or palpitations  Gastrointestinal: denies abdominal pain, N/V, or diarrhea/and bowel function as per interval history Genitourinary: denies burning with urination or urinary frequency  Vital signs in last 24 hours: [min-max] current  Temp:  [97.5 F (36.4 C)-99.8 F (37.7 C)] 97.5 F (36.4 C) (10/26 0415) Pulse Rate:  [67-93] 88 (10/26 0415) Resp:  [18-21] 18 (10/26 0415) BP: (115-138)/(67-82) 130/82 (10/26 0415) SpO2:  [94 %-100 %] 97 % (10/26 0415)     Height: 5\' 4"  (162.6 cm) Weight: 102.1 kg BMI (Calculated): 38.6   Intake/Output last 2 shifts:  10/25 0701 - 10/26 0700 In: 2302.7 [I.V.:2212.7; NG/GT:90] Out: 3200 [Emesis/NG output:3200]   Physical Exam:  Constitutional: alert, cooperative and no distress HENT: normocephalic without obvious abnormality, NGT in place Eyes: PERRL, EOM's grossly intact and symmetric  Respiratory: breathing non-labored at rest  Cardiovascular: regular rate and sinus rhythm  Gastrointestinal:Soft, non-tender, and non-distended, no rebound/guarding, surgical scar present Musculoskeletal:Noedema or wounds, motor and sensation  grossly intact, NT    Labs:  CBC Latest Ref Rng & Units 12/03/2019 12/02/2019 10/25/2019  WBC 4.0 - 10.5 K/uL 2.7(L) 5.9 7.2  Hemoglobin 12.0 - 15.0 g/dL 12.7 13.4 11.9(L)  Hematocrit 36 - 46 % 37.5 39.9 34.9(L)  Platelets 150 - 400 K/uL 256 300 225   CMP Latest Ref Rng & Units 12/08/2019 12/07/2019 12/06/2019  Glucose 70 - 99 mg/dL 90 105(H) 106(H)  BUN 6 - 20 mg/dL 14 16 16   Creatinine 0.44 - 1.00 mg/dL 0.71 0.69 0.85  Sodium 135 - 145 mmol/L 144 143 141  Potassium 3.5 - 5.1 mmol/L 4.6 3.6 2.8(L)  Chloride 98 - 111 mmol/L 112(H) 108 100  CO2 22 - 32 mmol/L 20(L) 25 28  Calcium 8.9 - 10.3 mg/dL 8.7(L) 8.8(L) 9.0  Total Protein 6.5 - 8.1 g/dL - - -  Total Bilirubin 0.3 - 1.2 mg/dL - - -  Alkaline Phos 38 - 126 U/L - - -  AST 15 - 41 U/L - - -  ALT 0 - 44 U/L - - -     Imaging studies:   KUB w/ gastrografin (12/07/2019 - 8 hr delay) personally reviewed which shows resolution of small bowel dilation and contrast throughout the colon and rectum, and radiologist repot reviewed below:  IMPRESSION: Contrast to level of the sigmoid rectal junction. No dilated loops of bowel.   Assessment/Plan: (ICD-10's: K2.609) 49 y.o. 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 She does appear to have recurrence of ventral hernia which contains fat and loop of small bowel, but this does  NOT appear to be the transition point and there is no evidence of bowel compromise in her work up nor on her examination.   - Will preform NGT clamping trial this morning  - NPO + IVF resuscitation; limit consumption of ice chips/water - Monitor abdominal examination; on-going bowel function - Serial KUBs as needed - Pain control prn (minimize narcotics); antiemetics prn               - Monitor renal function / electrolytes while NPO; replete as needed              - Mobilization as tolerates - No emergent surgical intervention; she understands that f she were to fail conservative measures then she may require exploration. - Further management per primary service; we will follow   All of the above findings and recommendations were discussed with the patient, and the medical team, and all of patient's questions were answered to her expressed satisfaction.  -- Edison Simon, PA-C [facility] 12/08/2019, 7:44 AM [phone] M-F: 7am - 4pm

## 2019-12-08 NOTE — Progress Notes (Signed)
Mobility Specialist - Progress Note   12/08/19 1400  Mobility  Activity Ambulated in hall  Level of Assistance Modified independent, requires aide device or extra time  Assistive Device None  Distance Ambulated (ft) 500 ft  Mobility Response Tolerated well  Mobility performed by Mobility specialist  $Mobility charge 1 Mobility    Pre-mobility: 125 HR, 98% SpO2 Post-mobility: 141 HR, 96% SpO2   Pt was sitting in recliner upon arrival. Pt agreed to session. Pt was able to ambulate 500' in room and hallway without AD and supervision. No LOB and no heavy breathing noted. Pt was able to carry out great conversation and turn head during ambulation. Pt was able to ambulate with walking boot for session this afternoon. Pt denied SOB, dizziness, or weakness in extremities. Overall, pt tolerated session well. Pt was left in recliner with all needs in reach.    Kathee Delton Mobility Specialist 12/08/19, 2:35 PM

## 2019-12-08 NOTE — Progress Notes (Signed)
FSBS 59, 12.5gm d50 given

## 2019-12-08 NOTE — Progress Notes (Signed)
Mobility Specialist - Progress Note   12/08/19 1143  Mobility  Activity Ambulated in hall  Level of Assistance Modified independent, requires aide device or extra time  Assistive Device Front wheel walker  Distance Ambulated (ft) 180 ft  Mobility Response Tolerated well  Mobility performed by Mobility specialist  $Mobility charge 1 Mobility    Pre-mobility: 105 HR, 95% SpO2 Post-mobility: 101 HR, 98% SpO2   Pt was ambulating to recliner upon arrival. Pt agreed to session. Pt was seen ambulating independently without AD upon entering room. Pt denied any pain, nausea, or fatigue this date. Pt stated that she "did not have walking boot in room today, but has been able to tolerate walking without it." Pt was modI in all transfers including ambulation. Pt ambulated 180' in hallway with RW and no LOB. No heavy breathing noted, pt denies SOB as well. No VC needed for proper gait posture that tolerates weight-bearing on L ankle. Pt is motivated to ambulate again later this evening. Overall, pt tolerated session well. Pt was left in recliner with all needs in reach.    Kathee Delton Mobility Specialist 12/08/19, 11:53 AM

## 2019-12-09 LAB — GLUCOSE, CAPILLARY
Glucose-Capillary: 101 mg/dL — ABNORMAL HIGH (ref 70–99)
Glucose-Capillary: 130 mg/dL — ABNORMAL HIGH (ref 70–99)
Glucose-Capillary: 156 mg/dL — ABNORMAL HIGH (ref 70–99)
Glucose-Capillary: 175 mg/dL — ABNORMAL HIGH (ref 70–99)
Glucose-Capillary: 129 mg/dL — ABNORMAL HIGH (ref 70–99)

## 2019-12-09 LAB — BASIC METABOLIC PANEL
Anion gap: 11 (ref 5–15)
BUN: 6 mg/dL (ref 6–20)
CO2: 24 mmol/L (ref 22–32)
Calcium: 8.9 mg/dL (ref 8.9–10.3)
Chloride: 102 mmol/L (ref 98–111)
Creatinine, Ser: 0.67 mg/dL (ref 0.44–1.00)
GFR, Estimated: >60 mL/min (ref >60)
Glucose, Bld: 187 mg/dL — ABNORMAL HIGH (ref 70–99)
Potassium: 3.2 mmol/L — ABNORMAL LOW (ref 3.5–5.1)
Sodium: 137 mmol/L (ref 135–145)

## 2019-12-09 MED ORDER — GLUCERNA SHAKE PO LIQD
237.0000 mL | Freq: Three times a day (TID) | ORAL | Status: DC
  Administered 2019-12-09: 237 mL via ORAL

## 2019-12-09 MED ORDER — ADULT MULTIVITAMIN W/MINERALS CH: 1.0000 | ORAL_TABLET | Freq: Every day | ORAL | Status: DC

## 2019-12-09 MED ORDER — POTASSIUM CHLORIDE 10 MEQ/100ML IV SOLN
10.0000 meq | INTRAVENOUS | Status: DC
  Administered 2019-12-09: 10 meq via INTRAVENOUS
  Filled 2019-12-09: qty 100

## 2019-12-09 NOTE — Progress Notes (Signed)
[facility] for Electrolyte Monitoring and Replacement   Recent Labs: Potassium (mmol/L)  Date Value  12/08/2019 4.6  02/22/2014 2.9 (L)   Magnesium (mg/dL)  Date Value  12/08/2019 2.2   Calcium (mg/dL)  Date Value  12/08/2019 8.7 (L)   Calcium, Total (mg/dL)  Date Value  02/22/2014 8.7   Albumin (g/dL)  Date Value  12/08/2019 3.1 (L)  09/09/2017 4.6   Phosphorus (mg/dL)  Date Value  12/08/2019 4.1   Sodium (mmol/L)  Date Value  12/08/2019 144  09/09/2017 142  02/22/2014 141    Assessment: 49yo female with PMH for DM, HTN, hypothyroidism, CKD III, with severe abdominal surgeries (including abdominal hysterectomy, hernia repair, and most recently ventral hernia repair in Nov 2020). Pt presented to ED c/o vomiting (non bloody, non bilious) and abdominal pain x1 day. CT abdomen and pelvis showed SBO. NG tube removed. Pharmacy has been consulted for electrolyte monitoring and replacement. NS + KCL 20 mEq continuous infusion was stopped yesterday.  K 3.2  Goal of Therapy:  Electrolytes WNL  Plan:   Will order KCL IV 10 mEq x4 runs.   Monitor electrolytes daily with AM labs and supplement as needed  Rowland Lathe, PharmD 12/09/2019 7:28 AM

## 2019-12-09 NOTE — Discharge Summary (Signed)
Marie Clarke DXI:[id number] DOB: [birth year] DOA: 12/02/2019  PCP: Sharyne Peach, MD  Admit date: 12/02/2019 Discharge date: 12/09/2019  Admitted From: home Disposition:  home  Recommendations for Outpatient Follow-up:  1. Follow up with PCP in 1 week 2. Please obtain BMP/CBC in one week      Discharge Condition:Stable CODE STATUS:full  Diet recommendation: carb modified   Brief/Interim Summary: Marie Clarke is a 49 y.o. female with medical history significant for diabetes, hypertension, hypothyroidism, CKD 3, with history of several abdominal surgeries, who presented with vomiting and abdominal pain.   SBO (small bowel obstruction) ([place]), recurrent complex surgeries in the past -Patient presenting with abdominal pain and vomiting. -CT abdomen and pelvis showing small bowel obstruction suggesting adhesion as a cause -- NG tube was placed and once clinically improved, it was discontinued.  - Dr. Celine Ahr surgery -- recommended  gastrographin study--no obstruction noted. Patients Clarke intake was advanced and she tolerated without any symptoms and she is being cleared to be discharged home today by general surgery She had bowel movements .   AKI (acute kidney injury) ([place]),  CKD stage IIIa hypokalemia -Likely prerenal secondary to acute process above Improved with ivf.   Essential (primary) hypertension Resume home meds  Adult hypothyroidism - resume home meds  Controlled type 2 diabetes mellitus without complication ([place]) with low sugar--asymptomatic Can resume home meds on discharge. Inpatient was placed on R-ISS  Chronic pain  Hypokalemia-she was supplemented with potassium prior to discharge.  Discharge Diagnoses:  Principal Problem:   SBO (small bowel obstruction) ([facility]) Active Problems:   Chronic kidney disease, stage III (moderate) (H[facility])   Essential (primary) hypertension   Adult hypothyroidism   Diabetes mellitus, type 2 ([place])   Controlled type 2  diabetes mellitus without complication ([place])   Type 2 diabetes mellitus ([place])   AKI (acute kidney injury) ([place])   Chronic pain   Hypokalemia    Discharge Instructions  Discharge Instructions    Call MD for:  persistant nausea and vomiting   Complete by: As directed    Diet - low sodium heart healthy   Complete by: As directed    Diet Carb Modified   Complete by: As directed    Discharge instructions   Complete by: As directed    Make sure have regular bowel movement Minimize pain meds F/u with pcp   Increase activity slowly   Complete by: As directed      Allergies as of 12/09/2019      Reactions   Diazepam Hives, Nausea And Vomiting   Ziprasidone Hcl Other (See Comments), Itching   CONFUSED CONFUSED Other reaction(s): Other (See Comments) CONFUSED CONFUSED   Azithromycin Hives   Divalproex Sodium Hives   Levofloxacin Hives, Rash   1/31: would like to retry since not taking valium   Lisinopril    Medicine cause kidney failure   Metronidazole Hives   1/31: would like to retry since she is no longer taking valium   Sulfa Antibiotics Hives   Valproic Acid Itching, Rash   Cephalexin Hives   Ciprofloxacin Hives   Penicillins Hives   Has patient had a PCN reaction causing immediate rash, facial/tongue/throat swelling, SOB or lightheadedness with hypotension: No Has patient had a PCN reaction causing severe rash involving mucus membranes or skin necrosis: No Has patient had a PCN reaction that required hospitalization: No Has patient had a PCN reaction occurring within the last 10 years: No If all of the above answers are "NO", then may  proceed with Cephalosporin use.      Medication List    STOP taking these medications   Klor-Con M20 20 MEQ tablet Generic drug: potassium chloride SA   methocarbamol 750 MG tablet Commonly known as: ROBAXIN   naproxen 500 MG tablet Commonly known as: NAPROSYN   topiramate 50 MG tablet Commonly known as: TOPAMAX     TAKE  these medications   atorvastatin 40 MG tablet Commonly known as: LIPITOR Take 40 mg by mouth at bedtime.   B-complex with vitamin C tablet Take 1 tablet by mouth daily.   benztropine 0.5 MG tablet Commonly known as: COGENTIN Take 1 tablet (0.5 mg total) by mouth 2 (two) times daily. Abnormal movements   busPIRone 15 MG tablet Commonly known as: BUSPAR Take 1 tablet (15 mg total) by mouth 2 (two) times daily.   darifenacin 7.5 MG 24 hr tablet Commonly known as: ENABLEX Take 7.5 mg by mouth at bedtime. Use as needed for OAB   desvenlafaxine 50 MG 24 hr tablet Commonly known as: PRISTIQ Take 1 tablet (50 mg total) by mouth at bedtime.   dicyclomine 10 MG capsule Commonly known as: BENTYL Take 10 mg by mouth 3 (three) times daily.   famotidine 20 MG tablet Commonly known as: PEPCID Take 40 mg by mouth 2 (two) times daily.   fluticasone 50 MCG/ACT nasal spray Commonly known as: FLONASE Place 2 sprays into both nostrils daily as needed.   gabapentin 800 MG tablet Commonly known as: Neurontin Take 1 tablet (800 mg total) by mouth at bedtime. What changed: You were already taking a medication with the same name, and this prescription was added. Make sure you understand how and when to take each.   gabapentin 600 MG tablet Commonly known as: NEURONTIN Take 1 tablet (600 mg total) by mouth 3 (three) times daily. Start taking on: December 17, 2019 What changed:   when to take this  These instructions start on December 17, 2019. If you are unsure what to do until then, ask your doctor or other care provider.   glipiZIDE 10 MG 24 hr tablet Commonly known as: GLUCOTROL XL Take 10 mg by mouth daily with breakfast.   hydrOXYzine 25 MG tablet Commonly known as: ATARAX/VISTARIL TAKE 1 TABLET BY MOUTH EVERY 6 HOURS AS NEEDED   levothyroxine 175 MCG tablet Commonly known as: SYNTHROID Take 175 mcg by mouth daily before breakfast.   metoprolol succinate 25 MG 24 hr  tablet Commonly known as: TOPROL-XL Take 25 mg by mouth daily. Takes in the evening   montelukast 10 MG tablet Commonly known as: SINGULAIR Take 10 mg by mouth daily. Takes in the afternoon   nitroGLYCERIN 0.4 MG SL tablet Commonly known as: NITROSTAT Place 0.4 mg under the tongue every 5 (five) minutes as needed for chest pain.   omeprazole 40 MG capsule Commonly known as: PRILOSEC Take 40 mg by mouth daily.   promethazine 25 MG tablet Commonly known as: PHENERGAN Take 25 mg by mouth every 6 (six) hours as needed.   QUEtiapine 100 MG tablet Commonly known as: SEROquel Take 1 tablet (100 mg total) by mouth at bedtime. To be combined with 25 mg at bedtime   QUEtiapine 25 MG tablet Commonly known as: SEROquel Take 1 tablet (25 mg total) by mouth as directed. Start taking 1 tablet  with 100 mg at bedtime and 1 tablet daily as needed for agitation only   rizatriptan 10 MG tablet Commonly known as: MAXALT Take 1  tablet (10 mg total) by mouth as needed.   sucralfate 1 g tablet Commonly known as: CARAFATE Take 1 g by mouth 4 (four) times daily.   torsemide 20 MG tablet Commonly known as: DEMADEX Take 1 tablet (20 mg total) by mouth 2 (two) times daily.   Trelegy Ellipta 100-62.5-25 MCG/INH Aepb Generic drug: Fluticasone-Umeclidin-Vilant Inhale 1 puff into the lungs daily. Takes at night   Trulicity 1.5 SE/8.3TD Sopn Generic drug: Dulaglutide Inject 1.5 mg into the skin once a week.   PROAIR RESPICLICK IN Inhale 1-2 puffs into the lungs every 6 (six) hours as needed (wheezing or shortness of breath).   Ventolin HFA 108 (90 Base) MCG/ACT inhaler Generic drug: albuterol Inhale 1-2 puffs into the lungs every 6 (six) hours as needed for wheezing or shortness of breath.   Vitamin D3 125 MCG (5000 UT) Tabs Take 1 tablet by mouth daily.   vitamin E 180 MG (400 UNITS) capsule 400 Units daily.       Allergies  Allergen Reactions  . Diazepam Hives and Nausea And  Vomiting  . Ziprasidone Hcl Other (See Comments) and Itching    CONFUSED CONFUSED Other reaction(s): Other (See Comments) CONFUSED CONFUSED   . Azithromycin Hives  . Divalproex Sodium Hives  . Levofloxacin Hives and Rash    1/31: would like to retry since not taking valium  . Lisinopril     Medicine cause kidney failure  . Metronidazole Hives    1/31: would like to retry since she is no longer taking valium  . Sulfa Antibiotics Hives  . Valproic Acid Itching and Rash  . Cephalexin Hives  . Ciprofloxacin Hives  . Penicillins Hives    Has patient had a PCN reaction causing immediate rash, facial/tongue/throat swelling, SOB or lightheadedness with hypotension: No Has patient had a PCN reaction causing severe rash involving mucus membranes or skin necrosis: No Has patient had a PCN reaction that required hospitalization: No Has patient had a PCN reaction occurring within the last 10 years: No If all of the above answers are "NO", then may proceed with Cephalosporin use.     Consultations:  General surgery   Procedures/Studies: DG Chest 2 View  Result Date: 12/02/2019 CLINICAL DATA:  Tachycardia EXAM: CHEST - 2 VIEW COMPARISON:  06/08/2018 FINDINGS: The lungs are symmetrically expanded. Minimal lingular atelectasis or infiltrate. Previously noted diffuse nodular pulmonary infiltrate has resolved. No pneumothorax or pleural effusion. Cardiac size within normal limits. Pulmonary vascularity is normal. Dorsal column stimulator leads extend to the level of T7. No acute bone abnormality. IMPRESSION: Minimal lingular atelectasis or infiltrate. Electronically Signed   By: Fidela Salisbury MD   On: 12/02/2019 09:24   DG Abd 1 View  Result Date: 12/06/2019 CLINICAL DATA:  Abdominal pain. EXAM: ABDOMEN - 1 VIEW COMPARISON:  December 05, 2019 FINDINGS: There is stable nasogastric tube and spinal stimulator wire positioning. Stable, mildly dilated small bowel loops are seen. No radio-opaque  calculi or other significant radiographic abnormality are seen. IMPRESSION: Stable, mildly dilated small bowel loops consistent with a small bowel obstruction versus ileus. Electronically Signed   By: Virgina Norfolk M.D.   On: 12/06/2019 18:33   DG Abd 1 View  Result Date: 12/05/2019 CLINICAL DATA:  Vomiting.  Multiple bowel movements. EXAM: ABDOMEN - 1 VIEW COMPARISON:  12/04/2019 FINDINGS: NG tube in stomach. No dilated large or small bowel. Stimulator device in the central canal of the spine. IMPRESSION: NG tube in stomach.  No bowel obstruction. Electronically Signed  By: Suzy Bouchard M.D.   On: 12/05/2019 15:43   DG Abd 1 View  Result Date: 12/04/2019 CLINICAL DATA:  Worsening pain and nausea EXAM: ABDOMEN - 1 VIEW COMPARISON:  12/04/2019, 12/02/2019, CT 12/02/2019 FINDINGS: Esophageal tube tip no longer visualized. Ascending thoracic stimulator leads. Persistent gaseous dilatation of small bowel measuring up to 4.7 cm, without significant radiographic change. Pelvic calcifications. IMPRESSION: Persistent gaseous dilatation of small bowel, not significantly changed radiograph performed earlier today. Previously noted esophageal tube is not identified. Electronically Signed   By: Donavan Foil M.D.   On: 12/04/2019 23:41   DG Abd 1 View  Result Date: 12/04/2019 CLINICAL DATA:  Small bowel obstruction EXAM: ABDOMEN - 1 VIEW COMPARISON:  12/02/2019 FINDINGS: Focal gas-filled moderately distended small bowel in the left upper quadrant. Visualized small bowel loops demonstrate evidence of wall thickening. Changes consistent with small bowel obstruction. Enteric tube is present in the left upper quadrant consistent with location in the stomach. Spinal stimulator device. Surgical clips in the pelvis. Degenerative changes in the spine and hips. IMPRESSION: Dilated gas-filled small bowel with evidence of wall thickening suggesting small bowel obstruction. Electronically Signed   By: Lucienne Capers M.D.   On: 12/04/2019 06:58   CT Chest Wo Contrast  Result Date: 12/02/2019 CLINICAL DATA:  Tachycardia and fever, recent COVID booster shot EXAM: CT CHEST WITHOUT CONTRAST TECHNIQUE: Multidetector CT imaging of the chest was performed following the standard protocol without IV contrast. COMPARISON:  Plain film from earlier in the same day, CT from 04/23/2015, 10/25/2019 FINDINGS: Cardiovascular: Somewhat limited due to lack of IV contrast. No significant atherosclerotic calcifications are noted. No aneurysmal dilatation is seen. No cardiac enlargement is noted. No coronary calcifications are seen. Mediastinum/Nodes: Thoracic inlet is within normal limits. No sizable hilar or mediastinal adenopathy is noted. Fluid is noted within the esophagus likely related to reflux given the degree of gastric distension. In the left axilla, there is some mild inflammatory change and minimal lymph nodes although not significant by size criteria. This likely represents reaction to the recent COVID booster shot. Correlate with the location of the recent booster. Lungs/Pleura: Lungs are well aerated bilaterally. Lingular scarring is noted stable from 2017. This corresponds to the finding seen on recent chest x-ray. Upper Abdomen: Visualized upper abdomen shows the visceral organs to be within normal limits with the exception of a small left adrenal nodule stable from the recent exam. The stomach is distended with significant amount of fluid and there is a single loop of dilated small bowel identified centrally on the inferior aspect of the imaging plane. Comparison with recent CT examination from 10/25/2019 shows evidence of anterior abdominal wall hernia with small bowel within. This raises suspicion for possible incarceration. CT of the abdomen and pelvis without contrast is recommended. Musculoskeletal: No chest wall mass or suspicious bone lesions identified. Spinal stimulator is noted. IMPRESSION: Changes on recent  chest x-ray are related to scarring unchanged from 2017. Increased inflammatory change in the region of the left axilla. This is likely related to the recent COVID booster shot given the patient's clinical history. Correlate with the injection side on the left. Distension of the stomach with fluid as well as a single loop of dilated small bowel suggestive of obstruction. Previous CT of the abdomen and pelvis demonstrates ventral hernia is and these changes are highly suspicious for small bowel incarceration. CT of the abdomen and pelvis without contrast is recommended for further evaluation. These results were called by telephone  at the time of interpretation on 12/02/2019 at 4:14 pm to provider Lavonia Drafts , who verbally acknowledged these results. Electronically Signed   By: Inez Catalina M.D.   On: 12/02/2019 16:15   CT ABDOMEN PELVIS W CONTRAST  Result Date: 12/02/2019 CLINICAL DATA:  Nausea, tachycardia, fever, unspecified abdominal pain EXAM: CT ABDOMEN AND PELVIS WITH CONTRAST TECHNIQUE: Multidetector CT imaging of the abdomen and pelvis was performed using the standard protocol following bolus administration of intravenous contrast. CONTRAST:  177mL OMNIPAQUE IOHEXOL 300 MG/ML  SOLN COMPARISON:  10/25/2019 FINDINGS: Lower chest: Discoid atelectasis at the left lung base. The visualized heart and pericardium are unremarkable. The distal esophagus appears fluid-filled suggesting changes of gastroesophageal reflux or esophageal dysmotility. Hepatobiliary: Moderate hepatic steatosis. No focal liver lesion. Gallbladder unremarkable. No intra or extrahepatic biliary ductal dilation. Pancreas: Unremarkable Spleen: Unremarkable Adrenals/Urinary Tract: Slight interval increase in left adrenal nodule, now measuring 22 mm, previously measuring 18 mm on remote prior examination of 04/26/2016. This is indeterminate and differential considerations include adrenal adenoma or pheochromocytoma. Right adrenal gland is  unremarkable. Kidneys are unremarkable. Bladder is unremarkable. Stomach/Bowel: A distal small bowel obstruction is present with multiple fluid-filled dilated loops of small bowel as well as fluid distension of the stomach. As noted previously, a tiny fat containing ventral hernia and slightly inferior parasagittal ventral hernia containing a single sidewall of small bowel are identified. The previously noted spigelian hernia, is no longer visualized suggesting interval repair. The bone transition of the small bowel is inferior to the expected location of the spigelian hernia, best seen on axial image # 61/2 and sagittal image # 55/6 suggesting a a adhesion in this location. All bowel demonstrates normal mural enhancement. No significant mesenteric edema identified. No free intraperitoneal gas or fluid. The ileum is decompressed. Surgical changes of sigmoid colectomy are identified. The large bowel is otherwise unremarkable. Appendix is unremarkable. Vascular/Lymphatic: No significant vascular findings are present. No enlarged abdominal or pelvic lymph nodes. Reproductive: Status post hysterectomy. No adnexal masses. Other: Rectum unremarkable. Healed laparotomy incision noted. Dorsal column stimulator leads are seen extending to the level of T7 with the battery pack seen within the subcutaneous fat of the right gluteal region. Musculoskeletal: Degenerative changes are seen within the lumbar spine. No lytic or blastic bone lesions are seen. IMPRESSION: Small-bowel obstruction with point of transition in the region of the previously noted spigelian hernia suggesting an adhesion as an underlying cause. Fluid-filled proximal small bowel and stomach with fluid within the esophagus in keeping with gastroesophageal reflux. Electronically Signed   By: Fidela Salisbury MD   On: 12/02/2019 17:45   DG Abd Portable 1V-Small Bowel Obstruction Protocol-24 hr delay  Result Date: 12/08/2019 CLINICAL DATA:  Small bowel  obstruction EXAM: PORTABLE ABDOMEN - 1 VIEW COMPARISON:  December 07, 2019 and December 06, 2019 radiograph; CT abdomen and pelvis December 02, 2019 FINDINGS: Nasogastric tube tip and side port are in the stomach. There is moderate stool in the colon. There is no bowel dilatation or air-fluid level to suggest bowel obstruction. No free air. There are surgical clips in left pelvis. A stimulator overlies the right abdomen with stimulator lead tips in the lower thoracic region. Lung bases clear. IMPRESSION: Nasogastric tube tip and side port in stomach. No findings indicative of bowel obstruction or free air. Thoracic stimulator leads in lower thoracic region. Surgical clips left pelvis. Electronically Signed   By: Lowella Grip III M.D.   On: 12/08/2019 14:08   DG Abd Portable  1V-Small Bowel Obstruction Protocol-initial, 8 hr delay  Result Date: 12/07/2019 CLINICAL DATA:  Delay for small-bowel obstruction EXAM: PORTABLE ABDOMEN - 1 VIEW COMPARISON:  None. FINDINGS: The bowel gas pattern is normal. Contrast is seen throughout the colon to the level of the sigmoid rectal junction. NG tube is seen within the mid body of the stomach. No radio-opaque calculi or other significant radiographic abnormality are seen. IMPRESSION: Contrast to level of the sigmoid rectal junction. No dilated loops of bowel. Electronically Signed   By: Prudencio Pair M.D.   On: 12/07/2019 20:47   DG Abd Portable 1 View  Result Date: 12/02/2019 CLINICAL DATA:  NG tube placement EXAM: PORTABLE ABDOMEN - 1 VIEW COMPARISON:  None. FINDINGS: NG tube tip is within the stomach. IMPRESSION: NG tube in the stomach. Electronically Signed   By: Rolm Baptise M.D.   On: 12/02/2019 19:14       Subjective: Feels well wants to go home. Discharge Exam: Vitals:   12/09/19 0449 12/09/19 1141  BP: 120/82 (!) 125/92  Pulse: 90 81  Resp: 16 20  Temp: 98.1 F (36.7 C) 97.7 F (36.5 C)  SpO2: 99% 100%   Vitals:   12/08/19 1927 12/08/19 2109  12/09/19 0449 12/09/19 1141  BP: 115/79  120/82 (!) 125/92  Pulse: 96 91 90 81  Resp: 18 18 16 20   Temp: 98.2 F (36.8 C)  98.1 F (36.7 C) 97.7 F (36.5 C)  TempSrc: Oral  Oral Oral  SpO2: 99% 98% 99% 100%  Weight:      Height:        General: Pt is alert, awake, not in acute distress Cardiovascular: RRR, S1/S2 +, no rubs, no gallops Respiratory: CTA bilaterally, no wheezing, no rhonchi Abdominal: Soft, NT, ND, bowel sounds + Extremities: no edema, no cyanosis    The results of significant diagnostics from this hospitalization (including imaging, microbiology, ancillary and laboratory) are listed below for reference.     Microbiology: Recent Results (from the past 240 hour(s))  Respiratory Panel by RT PCR (Flu A&B, Covid) - Nasopharyngeal Swab     Status: None   Collection Time: 12/02/19  3:21 PM   Specimen: Nasopharyngeal Swab  Result Value Ref Range Status   SARS Coronavirus 2 by RT PCR NEGATIVE NEGATIVE Final    Comment: (NOTE) SARS-CoV-2 target nucleic acids are NOT DETECTED.  The SARS-CoV-2 RNA is generally detectable in upper respiratoy specimens during the acute phase of infection. The lowest concentration of SARS-CoV-2 viral copies this assay can detect is 131 copies/mL. A negative result does not preclude SARS-Cov-2 infection and should not be used as the sole basis for treatment or other patient management decisions. A negative result may occur with  improper specimen collection/handling, submission of specimen other than nasopharyngeal swab, presence of viral mutation(s) within the areas targeted by this assay, and inadequate number of viral copies (<131 copies/mL). A negative result must be combined with clinical observations, patient history, and epidemiological information. The expected result is Negative.  Fact Sheet for Patients:  PinkCheek.be  Fact Sheet for Healthcare Providers:   GravelBags.it  This test is no t yet approved or cleared by the [place] FDA and  has been authorized for detection and/or diagnosis of SARS-CoV-2 by FDA under an Emergency Use Authorization (EUA). This EUA will remain  in effect (meaning this test can be used) for the duration of the COVID-19 declaration under Section 564(b)(1) of the Act, 21 U.S.C. section 360bbb-3(b)(1), unless the authorization is terminated  or revoked sooner.     Influenza A by PCR NEGATIVE NEGATIVE Final   Influenza B by PCR NEGATIVE NEGATIVE Final    Comment: (NOTE) The Xpert Xpress SARS-CoV-2/FLU/RSV assay is intended as an aid in  the diagnosis of influenza from Nasopharyngeal swab specimens and  should not be used as a sole basis for treatment. Nasal washings and  aspirates are unacceptable for Xpert Xpress SARS-CoV-2/FLU/RSV  testing.  Fact Sheet for Patients: PinkCheek.be  Fact Sheet for Healthcare Providers: GravelBags.it  This test is not yet approved or cleared by the [place] FDA and  has been authorized for detection and/or diagnosis of SARS-CoV-2 by  FDA under an Emergency Use Authorization (EUA). This EUA will remain  in effect (meaning this test can be used) for the duration of the  Covid-19 declaration under Section 564(b)(1) of the Act, 21  U.S.C. section 360bbb-3(b)(1), unless the authorization is  terminated or revoked. Performed at [facility], [address] [place], [place] [address]   Blood culture (routine single)     Status: None   Collection Time: 12/02/19  3:21 PM   Specimen: BLOOD  Result Value Ref Range Status   Specimen Description BLOOD RIGHT ANTECUBITAL  Final   Special Requests   Final    BOTTLES DRAWN AEROBIC AND ANAEROBIC Blood Culture results may not be optimal due to an excessive volume of blood received in culture bottles   Culture   Final    NO GROWTH 5  DAYS Performed at [facility], [address] [place], [place] [address]    Report Status 12/07/2019 FINAL  Final     Labs: BNP (last 3 results) No results for input(s): BNP in the last 8760 hours. Basic Metabolic Panel: Recent Labs  Lab 12/03/19 0410 12/03/19 1846 12/04/19 0542 12/04/19 0542 12/04/19 0550 12/05/19 0453 12/06/19 0446 12/07/19 0457 12/08/19 0630 12/09/19 0711  NA 139  --  143   < >  --  137 141 143 144 137  K 2.8*   < > 2.9*   < >  --  2.7* 2.8* 3.6 4.6 3.2*  CL 98  --  100   < >  --  98 100 108 112* 102  CO2 26  --  30   < >  --  27 28 25  20* 24  GLUCOSE 163*  --  140*   < >  --  158* 106* 105* 90 187*  BUN 27*  --  24*   < >  --  17 16 16 14 6   CREATININE 1.19*  --  0.85   < >  --  0.74 0.85 0.69 0.71 0.67  CALCIUM 8.7*  --  8.8*   < >  --  8.8* 9.0 8.8* 8.7* 8.9  MG 2.0  --   --   --  2.2 2.3  --  2.3 2.2  --   PHOS 4.0  --  2.9  --   --   --   --  3.2 4.1  --    < > = values in this interval not displayed.   Liver Function Tests: Recent Labs  Lab 12/04/19 0542 12/08/19 0630  ALBUMIN 3.4* 3.1*   No results for input(s): LIPASE, AMYLASE in the last 168 hours. No results for input(s): AMMONIA in the last 168 hours. CBC: Recent Labs  Lab 12/03/19 0410  WBC 2.7*  HGB 12.7  HCT 37.5  MCV 87.2  PLT 256   Cardiac  Enzymes: No results for input(s): CKTOTAL, CKMB, CKMBINDEX, TROPONINI in the last 168 hours. BNP: Invalid input(s): POCBNP CBG: Recent Labs  Lab 12/08/19 1554 12/08/19 1949 12/08/19 2348 12/09/19 0445 12/09/19 0745  GLUCAP 177* 175* 130* 101* 156*   D-Dimer No results for input(s): DDIMER in the last 72 hours. Hgb A1c No results for input(s): HGBA1C in the last 72 hours. Lipid Profile No results for input(s): CHOL, HDL, LDLCALC, TRIG, CHOLHDL, LDLDIRECT in the last 72 hours. Thyroid function studies No results for input(s): TSH, T4TOTAL, T3FREE, THYROIDAB in the last 72 hours.  Invalid input(s):  FREET3 Anemia work up No results for input(s): VITAMINB12, FOLATE, FERRITIN, TIBC, IRON, RETICCTPCT in the last 72 hours. Urinalysis    Component Value Date/Time   COLORURINE YELLOW (A) 12/02/2019 0424   APPEARANCEUR HAZY (A) 12/02/2019 0424   LABSPEC 1.043 (H) 12/02/2019 0424   PHURINE 5.0 12/02/2019 0424   GLUCOSEU NEGATIVE 12/02/2019 0424   HGBUR NEGATIVE 12/02/2019 0424   BILIRUBINUR NEGATIVE 12/02/2019 0424   KETONESUR NEGATIVE 12/02/2019 0424   PROTEINUR NEGATIVE 12/02/2019 0424   NITRITE NEGATIVE 12/02/2019 0424   LEUKOCYTESUR NEGATIVE 12/02/2019 0424   Sepsis Labs Invalid input(s): PROCALCITONIN,  WBC,  LACTICIDVEN Microbiology Recent Results (from the past 240 hour(s))  Respiratory Panel by RT PCR (Flu A&B, Covid) - Nasopharyngeal Swab     Status: None   Collection Time: 12/02/19  3:21 PM   Specimen: Nasopharyngeal Swab  Result Value Ref Range Status   SARS Coronavirus 2 by RT PCR NEGATIVE NEGATIVE Final    Comment: (NOTE) SARS-CoV-2 target nucleic acids are NOT DETECTED.  The SARS-CoV-2 RNA is generally detectable in upper respiratoy specimens during the acute phase of infection. The lowest concentration of SARS-CoV-2 viral copies this assay can detect is 131 copies/mL. A negative result does not preclude SARS-Cov-2 infection and should not be used as the sole basis for treatment or other patient management decisions. A negative result may occur with  improper specimen collection/handling, submission of specimen other than nasopharyngeal swab, presence of viral mutation(s) within the areas targeted by this assay, and inadequate number of viral copies (<131 copies/mL). A negative result must be combined with clinical observations, patient history, and epidemiological information. The expected result is Negative.  Fact Sheet for Patients:  PinkCheek.be  Fact Sheet for Healthcare Providers:   GravelBags.it  This test is no t yet approved or cleared by the [place] FDA and  has been authorized for detection and/or diagnosis of SARS-CoV-2 by FDA under an Emergency Use Authorization (EUA). This EUA will remain  in effect (meaning this test can be used) for the duration of the COVID-19 declaration under Section 564(b)(1) of the Act, 21 U.S.C. section 360bbb-3(b)(1), unless the authorization is terminated or revoked sooner.     Influenza A by PCR NEGATIVE NEGATIVE Final   Influenza B by PCR NEGATIVE NEGATIVE Final    Comment: (NOTE) The Xpert Xpress SARS-CoV-2/FLU/RSV assay is intended as an aid in  the diagnosis of influenza from Nasopharyngeal swab specimens and  should not be used as a sole basis for treatment. Nasal washings and  aspirates are unacceptable for Xpert Xpress SARS-CoV-2/FLU/RSV  testing.  Fact Sheet for Patients: PinkCheek.be  Fact Sheet for Healthcare Providers: GravelBags.it  This test is not yet approved or cleared by the [place] FDA and  has been authorized for detection and/or diagnosis of SARS-CoV-2 by  FDA under an Emergency Use Authorization (EUA). This EUA will remain  in effect (meaning this  test can be used) for the duration of the  Covid-19 declaration under Section 564(b)(1) of the Act, 21  U.S.C. section 360bbb-3(b)(1), unless the authorization is  terminated or revoked. Performed at [facility], [address] [place], [place] [address]   Blood culture (routine single)     Status: None   Collection Time: 12/02/19  3:21 PM   Specimen: BLOOD  Result Value Ref Range Status   Specimen Description BLOOD RIGHT ANTECUBITAL  Final   Special Requests   Final    BOTTLES DRAWN AEROBIC AND ANAEROBIC Blood Culture results may not be optimal due to an excessive volume of blood received in culture bottles   Culture   Final    NO GROWTH 5  DAYS Performed at [facility], [address] [place], [place] [address]    Report Status 12/07/2019 FINAL  Final     Time coordinating discharge: Over 30 minutes  SIGNED:   Nolberto Hanlon, MD  [facility] 12/09/2019, 1:02 PM Pager   If 7PM-7AM, please contact night-coverage [email or web address] Password [email or web address]

## 2019-12-09 NOTE — Progress Notes (Signed)
Nutrition Follow Up Note   DOCUMENTATION CODES:   Obesity unspecified  INTERVENTION:   Glucerna Shake po TID, each supplement provides 220 kcal and 10 grams of protein  MVI daily   Pt at high refeed risk; recommend monitor potassium, magnesium and phosphorus labs daily until stable  NUTRITION DIAGNOSIS:   Inadequate oral intake related to acute illness as evidenced by NPO status. -resolved  GOAL:   Patient will meet greater than or equal to 90% of their needs  -progressing   MONITOR:   PO intake, Supplement acceptance, Labs, Weight trends, Skin, I & O's  ASSESSMENT:   49 y/o female with PMH for DM, HTN, hypothyroidism, CKD III, bipolar disorder s/p multiple abominal surgeries (including abdominal hysterectomy, hernia repair, and most recently ventral hernia repair in Nov 2020). Pt now admitted with SBO.   Pt has remained on NPO/clear liquid diet since admit and is now without adequate nutrition for >7 days. Pt advanced to a soft diet today. RD will add supplements and MVI to help pt meet her estimated needs. Pt is at high refeed risk. If pt unable to tolerate soft diet, would recommend TPN. No new weight since admit; will request weekly weights.    Medications reviewed and include: insulin, synthroid   Labs reviewed: K 3.2(L) Wbc- 2.7(L) cbgs- 59, 111, 72, 88, 177 x 24 hrs  Diet Order:   Diet Order            DIET SOFT Room service appropriate? Yes; Fluid consistency: Thin  Diet effective now                EDUCATION NEEDS:   Education needs have been addressed  Skin:  Skin Assessment: Reviewed RN Assessment  Last BM:  10/26- type 7  Height:   Ht Readings from Last 1 Encounters:  12/02/19 5\' 4"  (1.626 m)    Weight:   Wt Readings from Last 1 Encounters:  12/02/19 102.1 kg    Ideal Body Weight:  54.5 kg  BMI:  Body mass index is 38.62 kg/m.  Estimated Nutritional Needs:   Kcal:  2000-2300kcal/day  Protein:  100-115g/day  Fluid:   1.6-1.9L/day  Koleen Distance MS, RD, LDN Please refer to [facility] for RD and/or RD on-call/weekend/after hours pager

## 2019-12-09 NOTE — Progress Notes (Signed)
D/C instructions given to patient. Patient verbalized understanding. No further questions or concerns at this time.   Thresa Ross, RN

## 2019-12-09 NOTE — Progress Notes (Signed)
[facility] SURGICAL PROGRESS NOTE (cpt [phone])  Hospital Day(s): 7.   Interval History: Patient seen and examined, no acute events or new complaints overnight. Patient reports she is doing much better and she is very anxious to get home. She denies abdominal pain, distension, nausea, emesis, fever, chills. She has tolerated CLD without issues. She is passing flatus and having multiple BMS. No other complaints. .  Review of Systems:  Constitutional: denies fever, chills  HEENT: denies cough or congestion  Respiratory: denies any shortness of breath  Cardiovascular: denies chest pain or palpitations  Gastrointestinal: denies abdominal pain, N/V, or diarrhea/and bowel function as per interval history Genitourinary: denies burning with urination or urinary frequency  Vital signs in last 24 hours: [min-max] current  Temp:  [97.9 F (36.6 C)-98.2 F (36.8 C)] 98.1 F (36.7 C) (10/27 0449) Pulse Rate:  [69-96] 90 (10/27 0449) Resp:  [15-18] 16 (10/27 0449) BP: (115-130)/(76-87) 120/82 (10/27 0449) SpO2:  [96 %-99 %] 99 % (10/27 0449)     Height: 5\' 4"  (162.6 cm) Weight: 102.1 kg BMI (Calculated): 38.6   Intake/Output last 2 shifts:  10/26 0701 - 10/27 0700 In: -  Out: 350 [Urine:350]   Physical Exam:  Constitutional: alert, cooperative and no distress HENT: normocephalic without obvious abnormality Eyes: PERRL, EOM's grossly intact and symmetric  Respiratory: breathing non-labored at rest  Cardiovascular: regular rate and sinus rhythm  Gastrointestinal:Soft,non-tender, and non-distended, no rebound/guarding, surgical scar present Musculoskeletal:Noedema or wounds, motor and sensation grossly intact, NT  Labs:  CBC Latest Ref Rng & Units 12/03/2019 12/02/2019 10/25/2019  WBC 4.0 - 10.5 K/uL 2.7(L) 5.9 7.2  Hemoglobin 12.0 - 15.0 g/dL 12.7 13.4 11.9(L)  Hematocrit 36 - 46 % 37.5 39.9 34.9(L)  Platelets 150 - 400 K/uL 256 300 225   CMP Latest Ref Rng & Units  12/09/2019 12/08/2019 12/07/2019  Glucose 70 - 99 mg/dL 187(H) 90 105(H)  BUN 6 - 20 mg/dL 6 14 16   Creatinine 0.44 - 1.00 mg/dL 0.67 0.71 0.69  Sodium 135 - 145 mmol/L 137 144 143  Potassium 3.5 - 5.1 mmol/L 3.2(L) 4.6 3.6  Chloride 98 - 111 mmol/L 102 112(H) 108  CO2 22 - 32 mmol/L 24 20(L) 25  Calcium 8.9 - 10.3 mg/dL 8.9 8.7(L) 8.8(L)  Total Protein 6.5 - 8.1 g/dL - - -  Total Bilirubin 0.3 - 1.2 mg/dL - - -  Alkaline Phos 38 - 126 U/L - - -  AST 15 - 41 U/L - - -  ALT 0 - 44 U/L - - -     Imaging studies: No new pertinent imaging studies   Assessment/Plan: (ICD-10's: K27.609) 49 y.o. 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 She does appear to have recurrence of ventral hernia which contains fat and loop of small bowel, but this does NOT appear to be the transition point and there is no evidence of bowel compromise in her work up nor on her examination.   - I advanced her to soft diet this morning   - Monitor abdominal examination; on-going bowel function - Serial KUBs as needed - Pain control prn (minimize narcotics); antiemetics prn   - Mobilization as tolerates - No emergent surgical intervention   - Further management per primary service   - Discharge Planning; Okay for discharge from surgical standpoint; we will sign off. She DOES NOT need surgical follow up; follow up at [facility] as needed  All of the above findings and recommendations were discussed with the patient, and the medical team, and all of patient's questions were answered to her expressed satisfaction.  -- Edison Simon, PA-C [facility] 12/09/2019, 8:19 AM [phone] M-F: 7am - 4pm

## 2019-12-10 ENCOUNTER — Other Ambulatory Visit: Payer: Self-pay | Admitting: Psychiatry

## 2019-12-10 DIAGNOSIS — R259 Unspecified abnormal involuntary movements: Secondary | ICD-10-CM

## 2019-12-23 ENCOUNTER — Encounter: Payer: Self-pay | Admitting: Pain Medicine

## 2019-12-25 ENCOUNTER — Other Ambulatory Visit: Payer: Self-pay | Admitting: Psychiatry

## 2019-12-25 DIAGNOSIS — F410 Panic disorder [episodic paroxysmal anxiety] without agoraphobia: Secondary | ICD-10-CM

## 2019-12-25 DIAGNOSIS — F3178 Bipolar disorder, in full remission, most recent episode mixed: Secondary | ICD-10-CM

## 2019-12-25 DIAGNOSIS — F4310 Post-traumatic stress disorder, unspecified: Secondary | ICD-10-CM

## 2019-12-26 ENCOUNTER — Inpatient Hospital Stay: Payer: Medicare HMO

## 2019-12-26 ENCOUNTER — Inpatient Hospital Stay
Admission: EM | Admit: 2019-12-26 | Discharge: 2019-12-27 | DRG: 395 | Disposition: A | Discharge status: Other Acute Inpatient Hospital | Payer: Medicare HMO | Attending: Surgery | Admitting: Surgery

## 2019-12-26 ENCOUNTER — Emergency Department: Payer: Medicare HMO

## 2019-12-26 ENCOUNTER — Other Ambulatory Visit: Payer: Self-pay

## 2019-12-26 ENCOUNTER — Encounter: Payer: Self-pay | Admitting: Emergency Medicine

## 2019-12-26 DIAGNOSIS — Z8679 Personal history of other diseases of the circulatory system: Secondary | ICD-10-CM

## 2019-12-26 DIAGNOSIS — Z20822 Contact with and (suspected) exposure to covid-19: Secondary | ICD-10-CM | POA: Diagnosis present

## 2019-12-26 DIAGNOSIS — Z888 Allergy status to other drugs, medicaments and biological substances status: Secondary | ICD-10-CM

## 2019-12-26 DIAGNOSIS — Z8673 Personal history of transient ischemic attack (TIA), and cerebral infarction without residual deficits: Secondary | ICD-10-CM

## 2019-12-26 DIAGNOSIS — Z79890 Hormone replacement therapy (postmenopausal): Secondary | ICD-10-CM | POA: Diagnosis not present

## 2019-12-26 DIAGNOSIS — F4310 Post-traumatic stress disorder, unspecified: Secondary | ICD-10-CM | POA: Diagnosis present

## 2019-12-26 DIAGNOSIS — Z8262 Family history of osteoporosis: Secondary | ICD-10-CM

## 2019-12-26 DIAGNOSIS — E785 Hyperlipidemia, unspecified: Secondary | ICD-10-CM | POA: Diagnosis present

## 2019-12-26 DIAGNOSIS — Z833 Family history of diabetes mellitus: Secondary | ICD-10-CM | POA: Diagnosis not present

## 2019-12-26 DIAGNOSIS — Z6838 Body mass index (BMI) 38.0-38.9, adult: Secondary | ICD-10-CM

## 2019-12-26 DIAGNOSIS — G2581 Restless legs syndrome: Secondary | ICD-10-CM | POA: Diagnosis present

## 2019-12-26 DIAGNOSIS — Z8249 Family history of ischemic heart disease and other diseases of the circulatory system: Secondary | ICD-10-CM

## 2019-12-26 DIAGNOSIS — I252 Old myocardial infarction: Secondary | ICD-10-CM

## 2019-12-26 DIAGNOSIS — F319 Bipolar disorder, unspecified: Secondary | ICD-10-CM | POA: Diagnosis present

## 2019-12-26 DIAGNOSIS — I10 Essential (primary) hypertension: Secondary | ICD-10-CM | POA: Diagnosis present

## 2019-12-26 DIAGNOSIS — E039 Hypothyroidism, unspecified: Secondary | ICD-10-CM | POA: Diagnosis present

## 2019-12-26 DIAGNOSIS — Z90710 Acquired absence of both cervix and uterus: Secondary | ICD-10-CM

## 2019-12-26 DIAGNOSIS — N3281 Overactive bladder: Secondary | ICD-10-CM | POA: Diagnosis present

## 2019-12-26 DIAGNOSIS — G894 Chronic pain syndrome: Secondary | ICD-10-CM | POA: Diagnosis present

## 2019-12-26 DIAGNOSIS — M810 Age-related osteoporosis without current pathological fracture: Secondary | ICD-10-CM | POA: Diagnosis present

## 2019-12-26 DIAGNOSIS — Z7984 Long term (current) use of oral hypoglycemic drugs: Secondary | ICD-10-CM | POA: Diagnosis not present

## 2019-12-26 DIAGNOSIS — J449 Chronic obstructive pulmonary disease, unspecified: Secondary | ICD-10-CM | POA: Diagnosis present

## 2019-12-26 DIAGNOSIS — K219 Gastro-esophageal reflux disease without esophagitis: Secondary | ICD-10-CM | POA: Diagnosis present

## 2019-12-26 DIAGNOSIS — Z87891 Personal history of nicotine dependence: Secondary | ICD-10-CM

## 2019-12-26 DIAGNOSIS — K430 Incisional hernia with obstruction, without gangrene: Secondary | ICD-10-CM | POA: Diagnosis present

## 2019-12-26 DIAGNOSIS — Z7951 Long term (current) use of inhaled steroids: Secondary | ICD-10-CM | POA: Diagnosis not present

## 2019-12-26 DIAGNOSIS — E119 Type 2 diabetes mellitus without complications: Secondary | ICD-10-CM | POA: Diagnosis present

## 2019-12-26 DIAGNOSIS — Z882 Allergy status to sulfonamides status: Secondary | ICD-10-CM

## 2019-12-26 DIAGNOSIS — Z0189 Encounter for other specified special examinations: Secondary | ICD-10-CM

## 2019-12-26 DIAGNOSIS — K56609 Unspecified intestinal obstruction, unspecified as to partial versus complete obstruction: Secondary | ICD-10-CM | POA: Diagnosis present

## 2019-12-26 DIAGNOSIS — M797 Fibromyalgia: Secondary | ICD-10-CM | POA: Diagnosis present

## 2019-12-26 DIAGNOSIS — Z881 Allergy status to other antibiotic agents status: Secondary | ICD-10-CM

## 2019-12-26 DIAGNOSIS — Z880 Allergy status to penicillin: Secondary | ICD-10-CM

## 2019-12-26 DIAGNOSIS — I2510 Atherosclerotic heart disease of native coronary artery without angina pectoris: Secondary | ICD-10-CM | POA: Diagnosis present

## 2019-12-26 DIAGNOSIS — M160 Bilateral primary osteoarthritis of hip: Secondary | ICD-10-CM | POA: Diagnosis present

## 2019-12-26 DIAGNOSIS — E669 Obesity, unspecified: Secondary | ICD-10-CM | POA: Diagnosis present

## 2019-12-26 DIAGNOSIS — Z79899 Other long term (current) drug therapy: Secondary | ICD-10-CM | POA: Diagnosis not present

## 2019-12-26 LAB — COMPREHENSIVE METABOLIC PANEL
ALT: 14 U/L (ref 0–44)
AST: 13 U/L — ABNORMAL LOW (ref 15–41)
Albumin: 3.8 g/dL (ref 3.5–5.0)
Alkaline Phosphatase: 95 U/L (ref 38–126)
Anion gap: 12 (ref 5–15)
BUN: 12 mg/dL (ref 6–20)
CO2: 19 mmol/L — ABNORMAL LOW (ref 22–32)
Calcium: 9 mg/dL (ref 8.9–10.3)
Chloride: 103 mmol/L (ref 98–111)
Creatinine, Ser: 1.12 mg/dL — ABNORMAL HIGH (ref 0.44–1.00)
GFR, Estimated: >60 mL/min (ref >60)
Glucose, Bld: 141 mg/dL — ABNORMAL HIGH (ref 70–99)
Potassium: 3.6 mmol/L (ref 3.5–5.1)
Sodium: 134 mmol/L — ABNORMAL LOW (ref 135–145)
Total Bilirubin: 0.6 mg/dL (ref 0.3–1.2)
Total Protein: 8 g/dL (ref 6.5–8.1)

## 2019-12-26 LAB — RESPIRATORY PANEL BY RT PCR (FLU A&B, COVID)
Influenza A by PCR: NEGATIVE
Influenza B by PCR: NEGATIVE
SARS Coronavirus 2 by RT PCR: NEGATIVE

## 2019-12-26 LAB — CBC
HCT: 35.2 % — ABNORMAL LOW (ref 36.0–46.0)
Hemoglobin: 11.7 g/dL — ABNORMAL LOW (ref 12.0–15.0)
MCH: 29.5 pg (ref 26.0–34.0)
MCHC: 33.2 g/dL (ref 30.0–36.0)
MCV: 88.7 fL (ref 80.0–100.0)
Platelets: 283 10*3/uL (ref 150–400)
RBC: 3.97 MIL/uL (ref 3.87–5.11)
RDW: 16 % — ABNORMAL HIGH (ref 11.5–15.5)
WBC: 5 10*3/uL (ref 4.0–10.5)
nRBC: 0.4 % — ABNORMAL HIGH (ref 0.0–0.2)

## 2019-12-26 LAB — GLUCOSE, CAPILLARY
Glucose-Capillary: 88 mg/dL (ref 70–99)
Glucose-Capillary: 65 mg/dL — ABNORMAL LOW (ref 70–99)
Glucose-Capillary: 145 mg/dL — ABNORMAL HIGH (ref 70–99)

## 2019-12-26 LAB — LIPASE, BLOOD: Lipase: 22 U/L (ref 11–51)

## 2019-12-26 MED ORDER — SODIUM CHLORIDE 0.9 % IV SOLN: INTRAVENOUS | Status: DC

## 2019-12-26 MED ORDER — DIATRIZOATE MEGLUMINE & SODIUM 66-10 % PO SOLN
90.0000 mL | Freq: Once | ORAL | Status: AC
  Administered 2019-12-26: 90 mL via ORAL

## 2019-12-26 MED ORDER — IOHEXOL 300 MG/ML  SOLN
100.0000 mL | Freq: Once | INTRAMUSCULAR | Status: AC | PRN
  Administered 2019-12-26: 100 mL via INTRAVENOUS

## 2019-12-26 MED ORDER — SODIUM CHLORIDE 0.9 % IV BOLUS
1000.0000 mL | Freq: Once | INTRAVENOUS | Status: AC
  Administered 2019-12-26: 1000 mL via INTRAVENOUS

## 2019-12-26 MED ORDER — KETOROLAC TROMETHAMINE 30 MG/ML IJ SOLN
15.0000 mg | Freq: Once | INTRAMUSCULAR | Status: AC
  Administered 2019-12-26: 15 mg via INTRAVENOUS
  Filled 2019-12-26: qty 1

## 2019-12-26 MED ORDER — MORPHINE SULFATE (PF) 2 MG/ML IV SOLN
2.0000 mg | INTRAVENOUS | Status: DC | PRN
  Administered 2019-12-26 – 2019-12-27 (×6): 2 mg via INTRAVENOUS
  Filled 2019-12-26 (×6): qty 1

## 2019-12-26 MED ORDER — GABAPENTIN 800 MG PO TABS
800.0000 mg | ORAL_TABLET | Freq: Every day | ORAL | Status: DC
  Filled 2019-12-26: qty 1

## 2019-12-26 MED ORDER — DARIFENACIN HYDROBROMIDE ER 7.5 MG PO TB24
7.5000 mg | ORAL_TABLET | Freq: Every day | ORAL | Status: DC
  Administered 2019-12-26: 7.5 mg via ORAL
  Filled 2019-12-26 (×2): qty 1

## 2019-12-26 MED ORDER — DEXTROSE 50 % IV SOLN: 25.0000 mL | Freq: Once | INTRAVENOUS | Status: DC

## 2019-12-26 MED ORDER — PROMETHAZINE HCL 25 MG PO TABS
25.0000 mg | ORAL_TABLET | Freq: Four times a day (QID) | ORAL | Status: DC | PRN
  Administered 2019-12-27 (×2): 25 mg via ORAL
  Filled 2019-12-26 (×2): qty 1

## 2019-12-26 MED ORDER — ONDANSETRON HCL 4 MG/2ML IJ SOLN
4.0000 mg | Freq: Once | INTRAMUSCULAR | Status: AC
  Administered 2019-12-26: 4 mg via INTRAVENOUS
  Filled 2019-12-26: qty 2

## 2019-12-26 MED ORDER — CHLORHEXIDINE GLUCONATE 0.12 % MT SOLN: 15.0000 mL | Freq: Two times a day (BID) | OROMUCOSAL | Status: DC

## 2019-12-26 MED ORDER — FAMOTIDINE 20 MG PO TABS
40.0000 mg | ORAL_TABLET | Freq: Two times a day (BID) | ORAL | Status: DC
  Administered 2019-12-26: 40 mg via ORAL
  Filled 2019-12-26: qty 2

## 2019-12-26 MED ORDER — ATORVASTATIN CALCIUM 20 MG PO TABS
40.0000 mg | ORAL_TABLET | Freq: Every day | ORAL | Status: DC
  Administered 2019-12-26: 40 mg via ORAL
  Filled 2019-12-26: qty 2

## 2019-12-26 MED ORDER — ENOXAPARIN SODIUM 40 MG/0.4ML ~~LOC~~ SOLN: 40.0000 mg | SUBCUTANEOUS | Status: DC

## 2019-12-26 MED ORDER — LORAZEPAM 2 MG/ML IJ SOLN
1.0000 mg | Freq: Once | INTRAMUSCULAR | Status: AC
  Administered 2019-12-26: 1 mg via INTRAVENOUS
  Filled 2019-12-26: qty 1

## 2019-12-26 MED ORDER — INSULIN ASPART 100 UNIT/ML ~~LOC~~ SOLN: 0.0000 [IU] | Freq: Three times a day (TID) | SUBCUTANEOUS | Status: DC

## 2019-12-26 MED ORDER — QUETIAPINE FUMARATE 100 MG PO TABS
100.0000 mg | ORAL_TABLET | Freq: Every day | ORAL | Status: DC
  Filled 2019-12-26: qty 1

## 2019-12-26 MED ORDER — METOPROLOL SUCCINATE ER 25 MG PO TB24
25.0000 mg | ORAL_TABLET | Freq: Every day | ORAL | Status: DC
  Filled 2019-12-26: qty 1

## 2019-12-26 MED ORDER — MONTELUKAST SODIUM 10 MG PO TABS: 10.0000 mg | ORAL_TABLET | Freq: Every day | ORAL | Status: DC

## 2019-12-26 MED ORDER — VENLAFAXINE HCL ER 75 MG PO CP24: 75.0000 mg | ORAL_CAPSULE | Freq: Every day | ORAL | Status: DC

## 2019-12-26 MED ORDER — SUCRALFATE 1 G PO TABS
1.0000 g | ORAL_TABLET | Freq: Four times a day (QID) | ORAL | Status: DC
  Administered 2019-12-26: 1 g via ORAL
  Filled 2019-12-26: qty 1

## 2019-12-26 MED ORDER — BENZTROPINE MESYLATE 0.5 MG PO TABS
0.5000 mg | ORAL_TABLET | Freq: Two times a day (BID) | ORAL | Status: DC
  Administered 2019-12-26: 0.5 mg via ORAL
  Filled 2019-12-26 (×4): qty 1

## 2019-12-26 MED ORDER — GLIPIZIDE ER 10 MG PO TB24
10.0000 mg | ORAL_TABLET | Freq: Every day | ORAL | Status: DC
  Filled 2019-12-26 (×2): qty 1

## 2019-12-26 MED ORDER — DOCUSATE SODIUM 100 MG PO CAPS: 100.0000 mg | ORAL_CAPSULE | Freq: Two times a day (BID) | ORAL | Status: DC | PRN

## 2019-12-26 MED ORDER — QUETIAPINE FUMARATE 100 MG PO TABS: 100.0000 mg | ORAL_TABLET | Freq: Every day | ORAL | Status: DC

## 2019-12-26 MED ORDER — HYDROCODONE-ACETAMINOPHEN 5-325 MG PO TABS: 1.0000 | ORAL_TABLET | ORAL | Status: DC | PRN

## 2019-12-26 MED ORDER — ENOXAPARIN SODIUM 60 MG/0.6ML ~~LOC~~ SOLN
50.0000 mg | SUBCUTANEOUS | Status: DC
  Filled 2019-12-26: qty 0.6

## 2019-12-26 MED ORDER — GABAPENTIN 400 MG PO CAPS
800.0000 mg | ORAL_CAPSULE | Freq: Every day | ORAL | Status: DC
  Administered 2019-12-26: 800 mg via ORAL
  Filled 2019-12-26: qty 2

## 2019-12-26 MED ORDER — BENZOCAINE 20 % MT AERO
1.0000 "application " | INHALATION_SPRAY | Freq: Once | OROMUCOSAL | Status: DC
  Filled 2019-12-26: qty 57

## 2019-12-26 MED ORDER — ORAL CARE MOUTH RINSE: 15.0000 mL | Freq: Two times a day (BID) | OROMUCOSAL | Status: DC

## 2019-12-26 MED ORDER — B COMPLEX-C PO TABS
1.0000 | ORAL_TABLET | Freq: Every day | ORAL | Status: DC
  Filled 2019-12-26 (×2): qty 1

## 2019-12-26 MED ORDER — QUETIAPINE FUMARATE 25 MG PO TABS
25.0000 mg | ORAL_TABLET | Freq: Every day | ORAL | Status: DC
  Filled 2019-12-26: qty 1

## 2019-12-26 MED ORDER — TRAMADOL HCL 50 MG PO TABS: 50.0000 mg | ORAL_TABLET | Freq: Four times a day (QID) | ORAL | Status: DC | PRN

## 2019-12-26 MED ORDER — LEVOTHYROXINE SODIUM 50 MCG PO TABS
175.0000 ug | ORAL_TABLET | Freq: Every day | ORAL | Status: DC
  Administered 2019-12-27: 175 ug via ORAL
  Filled 2019-12-26: qty 1

## 2019-12-26 MED ORDER — DICYCLOMINE HCL 10 MG PO CAPS
10.0000 mg | ORAL_CAPSULE | Freq: Three times a day (TID) | ORAL | Status: DC
  Administered 2019-12-26: 10 mg via ORAL
  Filled 2019-12-26 (×5): qty 1

## 2019-12-26 MED ORDER — HYDROXYZINE HCL 25 MG PO TABS: 25.0000 mg | ORAL_TABLET | Freq: Four times a day (QID) | ORAL | Status: DC | PRN

## 2019-12-26 MED ORDER — PANTOPRAZOLE SODIUM 40 MG IV SOLR
40.0000 mg | Freq: Every day | INTRAVENOUS | Status: DC
  Administered 2019-12-26 – 2019-12-27 (×2): 40 mg via INTRAVENOUS
  Filled 2019-12-26 (×2): qty 40

## 2019-12-26 MED ORDER — BUSPIRONE HCL 15 MG PO TABS
15.0000 mg | ORAL_TABLET | Freq: Two times a day (BID) | ORAL | Status: DC
  Administered 2019-12-26: 15 mg via ORAL
  Filled 2019-12-26 (×4): qty 1

## 2019-12-26 MED ORDER — PROMETHAZINE HCL 25 MG/ML IJ SOLN
25.0000 mg | Freq: Once | INTRAMUSCULAR | Status: AC
  Administered 2019-12-26: 25 mg via INTRAVENOUS
  Filled 2019-12-26: qty 1

## 2019-12-26 MED ORDER — GABAPENTIN 600 MG PO TABS
600.0000 mg | ORAL_TABLET | Freq: Three times a day (TID) | ORAL | Status: DC
  Administered 2019-12-26: 600 mg via ORAL
  Filled 2019-12-26: qty 1

## 2019-12-26 MED ORDER — DEXTROSE 50 % IV SOLN
INTRAVENOUS | Status: AC
  Administered 2019-12-26: 25 mL
  Filled 2019-12-26: qty 50

## 2019-12-26 MED ORDER — QUETIAPINE FUMARATE 25 MG PO TABS: 25.0000 mg | ORAL_TABLET | Freq: Every day | ORAL | Status: DC | PRN

## 2019-12-26 MED ORDER — VITAMIN D3 25 MCG (1000 UNIT) PO TABS
5000.0000 [IU] | ORAL_TABLET | Freq: Every day | ORAL | Status: DC
  Filled 2019-12-26 (×2): qty 5

## 2019-12-26 MED ORDER — SUMATRIPTAN SUCCINATE 50 MG PO TABS
50.0000 mg | ORAL_TABLET | ORAL | Status: DC | PRN
  Filled 2019-12-26: qty 1

## 2019-12-26 NOTE — ED Provider Notes (Signed)
[facility] Emergency Department Provider Note   ____________________________________________   First MD Initiated Contact with Patient 12/26/19 [phone]     (approximate)  I have reviewed the triage vital signs and the nursing notes.   HISTORY  Chief Complaint Abdominal Pain and Emesis    HPI Marie Clarke is a 49 y.o. female with a stated past medical history of midline abdominal hernia with multiple small bowel obstructions secondary to adhesions as well as bipolar disorder, CAD, CHF, chronic pain syndrome, and type 2 diabetes who presents for nausea/vomiting/abdominal pain that she states is similar to previous small bowel obstruction she has had in the past.  Patient describes sharp, midline, nonradiating abdominal pain that has been worsening since onset approximately 1 hour prior to arrival.  Patient also describes 3-4 episodes of nonbloody emesis since this abdominal pain started.  Patient states that she was just discharged from [facility] 3 days prior to arrival after being admitted for similar symptoms and was found to have a small bowel obstruction         Past Medical History:  Diagnosis Date  . Anemia   . Anginal pain ([place])   . Anxiety   . Arthritis   . Asthma   . Bilateral lower extremity edema   . Bipolar disorder ([place])   . CAD (coronary artery disease) unk  . CHF (congestive heart failure) ([place])   . Chronic pain syndrome   . COPD (chronic obstructive pulmonary disease) (HCC)    emphysema  . DDD (degenerative disc disease), lumbar   . Depression unk  . Diabetes mellitus without complication ([place])   . Diabetes mellitus, type II ([place])   . Drug overdose May 05, 2013   after dad died  . Dysrhythmia    history of tachy arrythmias  . Fibromyalgia   . GERD (gastroesophageal reflux disease)   . Headache    chronic migraines  . Hyperlipidemia   . Hypertension   . Hypothyroidism   . Left leg pain 04/29/2014  . MI (myocardial infarction) ([place]) May 05, 2005  .  Muscle ache 09/16/2014  . Osteoporosis   . Overactive bladder   . Pancreatitis unk  . Panic attacks   . PTSD (post-traumatic stress disorder)   . Reflex sympathetic dystrophy   . Renal cyst, left   . Renal insufficiency    ckd stage iii  . Restless legs syndrome   . Sleep apnea 06/2019   uses cpap  . Sleep apnea   . Stroke ([place]) May 05, 2008   TIA x 2. no residual deficits  . Suicide attempt [facility])    after father died.  . Thyroid disease    thyroid nodule  . TIA (transient ischemic attack) unk  . TIA (transient ischemic attack)     Patient Active Problem List   Diagnosis Date Noted  . Hypokalemia   . SBO (small bowel obstruction) ([place]) 12/02/2019  . Chronic pain 12/02/2019  . Presence of neurostimulator (SCS) (June 2021) 10/06/2019  . Pain due to any device, implant or graft 09/21/2019  . Anxiety 09/10/2019  . Involuntary movements 09/10/2019  . Abnormal involuntary movement 08/18/2019  . Bipolar disorder, in full remission, most recent episode mixed ([place]) 08/11/2019  . Lumbosacral radiculopathy at L5 (Right) 04/09/2019  . Chronic migraine 03/12/2019  . Gout 01/21/2019  . Noncompliance with treatment regimen 12/29/2018  . BMI 40.0-44.9, adult ([place]) 11/19/2018  . AKI (acute kidney injury) ([place]) 11/17/2018  . Postural dizziness with presyncope 09/29/2018  . Bipolar I disorder, most  recent episode mixed ([place]) 09/10/2018  . Panic attacks 09/10/2018  . Chronic lumbosacral L5-S1 IVD protrusion (Bilateral) 08/25/2018  . Lumbar lateral recess stenosis (L5-S1) (Bilateral) 08/25/2018  . Wound disruption 08/05/2018  . Osteoarthritis involving multiple joints 07/10/2018  . Long term current use of non-steroidal anti-inflammatories (NSAID) 07/10/2018  . NSAID induced gastritis 07/10/2018  . DDD (degenerative disc disease), lumbosacral 04/22/2018  . Disease related peripheral neuropathy 11/13/2017  . Gastroesophageal reflux disease without esophagitis 11/13/2017  . Occipital headache  (Bilateral) 11/13/2017  . Cervicogenic headache (Bilateral) (L>R) 11/13/2017  . History of postoperative nausea 10/22/2017  . Chronic shoulder pain (Fifth Area of Pain) (Bilateral) (L>R) 10/09/2017  . Ankle joint instability (Left) 10/09/2017  . Ankle sprain, sequela (Left) 10/09/2017  . History of psychiatric symptoms 10/09/2017  . Chronic pain syndrome 10/02/2017  . Spondylosis without myelopathy or radiculopathy, lumbosacral region 10/02/2017  . Chronic musculoskeletal pain 10/02/2017  . Elevated C-reactive protein (CRP) 09/10/2017  . Elevated sed rate 09/10/2017  . Chronic neck pain (Fourth Area of Pain) (Bilateral) (L>R) 09/09/2017  . Pharmacologic therapy 09/09/2017  . Disorder of skeletal system 09/09/2017  . Problems influencing health status 09/09/2017  . Long term current use of opiate analgesic 09/09/2017  . Tobacco use disorder 07/16/2017  . Ventral hernia without obstruction or gangrene 06/25/2017  . Strain of extensor muscle, fascia and tendon of left index finger at wrist and hand level, initial encounter 05/16/2017  . Sepsis ([place]) 04/14/2017  . Osteopenia 04/03/2017  . Hypotension 09/17/2016  . Contusion of knee (Left) 10/12/2015  . Strain of knee (Left) 10/12/2015  . Incidental lung nodule 04/28/2015  . Chronic low back pain (Primary Area of Pain) (Bilateral) (L>R) 03/28/2015  . Chronic lower extremity pain (Referred) (Secondary Area of Pain) (Left) 03/28/2015  . Abdominal wound dehiscence 03/28/2015  . Encounter for pain management planning 03/28/2015  . Morbid obesity ([place]) 03/28/2015  . Abnormal CT scan, lumbar spine 03/28/2015  . Lumbar facet hypertrophy 03/28/2015  . Lumbar facet syndrome (Bilateral) (L>R) 03/28/2015  . Lumbar foraminal stenosis (Bilateral) (L5-S1) 03/28/2015  . Chronic ankle pain [facility] Area of Pain) (Left) 03/28/2015  . Neurogenic pain 03/28/2015  . Neuropathic pain 03/28/2015  . Myofascial pain 03/28/2015  . History of suicide  attempt 03/28/2015  . PTSD (post-traumatic stress disorder) 01/13/2015  . Abnormal gait 12/15/2014  . Congestive heart failure ([place]) 11/15/2014  . Abdominal wall abscess 09/20/2014  . 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 Diabetes mellitus, type 2 ([place]) 08/13/2014  . Bipolar affective disorder ([place]) 08/13/2014  . Type 2 diabetes mellitus ([employer]) 08/13/2014  . Rectal prolapse 08/09/2014  . Rectal bleeding 08/09/2014  . Rectal bleed 08/09/2014  . Affective bipolar disorder ([place]) 08/05/2014  . Arteriosclerosis of coronary artery 08/05/2014  . CCF (congestive cardiac failure) ([place]) 08/05/2014  . Chronic kidney disease 08/05/2014  . 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 Apnea, sleep 08/05/2014  . Temporary cerebral vascular dysfunction 08/05/2014  . Polypharmacy 04/29/2014  . Other long term (current) drug therapy 04/29/2014  . 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 Chronic kidney disease, stage III (moderate) ([place]) 12/14/2013  . Controlled diabetes mellitus type II without complication ([place]) 95/10/3265  . Essential (primary) hypertension 12/03/2013  . Adult hypothyroidism 12/03/2013  . Controlled type 2 diabetes mellitus without complication ([place]) 12/45/8099    Past Surgical History:  Procedure Laterality Date  . ABDOMINAL HYSTERECTOMY    . CARDIAC CATHETERIZATION    . HERNIA REPAIR  07/15/2017   [facility]  . lumbar facet  several  . prolapse rectum surgery N/A July 2016  . THORACIC LAMINECTOMY FOR SPINAL CORD STIMULATOR N/A 07/27/2019   Procedure: THORACIC SPINAL CORD STIMULATOR PLACEMENT WITH RIGHT FLANK PULSE GENERATOR;  Surgeon: [name], MD;  Location: [facility] ORS;  Service: Neurosurgery;  Laterality: N/A;  . TONSILLECTOMY      Prior to Admission medications   Medication Sig Start Date End Date Taking? Authorizing Provider  Albuterol Sulfate (PROAIR RESPICLICK IN) Inhale 1-2 puffs into the lungs every 6 (six) hours as needed (wheezing or shortness of breath).    [provider]  atorvastatin (LIPITOR) 40 MG tablet Take 40 mg by mouth at bedtime.     [provider]  B Complex-C (B-COMPLEX WITH VITAMIN C) tablet Take 1 tablet by mouth daily.     [provider]  benztropine (COGENTIN) 0.5 MG tablet TAKE 1 TABLET BY MOUTH TWICE A DAY FOR ABNORMAL MOVEMENTS. 12/10/19   Ursula Alert, MD  busPIRone (BUSPAR) 15 MG tablet Take 1 tablet (15 mg total) by mouth 2 (two) times daily. 10/14/19   Ursula Alert, MD  Cholecalciferol (VITAMIN D3) 125 MCG (5000 UT) TABS Take 1 tablet by mouth daily.     [provider]  darifenacin (ENABLEX) 7.5 MG 24 hr tablet Take 7.5 mg by mouth at bedtime. Use as needed for OAB 03/18/19 03/17/20  [provider]  desvenlafaxine (PRISTIQ) 50 MG 24 hr tablet Take 1 tablet (50 mg total) by mouth at bedtime. 10/14/19   Ursula Alert, MD  dicyclomine (BENTYL) 10 MG capsule Take 10 mg by mouth 3 (three) times daily.     [provider]  famotidine (PEPCID) 20 MG tablet Take 40 mg by mouth 2 (two) times daily.  04/01/19   [provider]  fluticasone (FLONASE) 50 MCG/ACT nasal spray Place 2 sprays into both nostrils daily as needed.  06/03/19   [provider]  gabapentin (NEURONTIN) 600 MG tablet Take 1 tablet (600 mg total) by mouth 3 (three) times daily. 12/17/19 01/16/20  Milinda Pointer, MD  gabapentin (NEURONTIN) 800 MG tablet Take 1 tablet (800 mg total) by mouth at bedtime. 12/07/19 01/16/20  Milinda Pointer, MD  glipiZIDE (GLUCOTROL XL) 10 MG 24 hr tablet Take 10 mg by mouth daily with breakfast.  11/17/19 11/16/20  [provider]  hydrOXYzine (ATARAX/VISTARIL) 25 MG tablet TAKE 1 TABLET BY MOUTH EVERY 6 HOURS AS NEEDED Patient taking differently: Take 25 mg by mouth every 6 (six) hours as needed.  11/09/19   Ursula Alert, MD  levothyroxine (SYNTHROID) 175 MCG tablet Take 175 mcg by mouth daily before breakfast.    [provider]  metoprolol succinate  (TOPROL-XL) 25 MG 24 hr tablet Take 25 mg by mouth daily. Takes in the evening 02/27/19   [provider]  montelukast (SINGULAIR) 10 MG tablet Take 10 mg by mouth daily. Takes in the afternoon    [provider]  nitroGLYCERIN (NITROSTAT) 0.4 MG SL tablet Place 0.4 mg under the tongue every 5 (five) minutes as needed for chest pain.     [provider]  omeprazole (PRILOSEC) 40 MG capsule Take 40 mg by mouth daily.  11/16/19   [provider]  promethazine (PHENERGAN) 25 MG tablet Take 25 mg by mouth every 6 (six) hours as needed.  11/12/19   [provider]  QUEtiapine (SEROQUEL) 100 MG tablet Take 1 tablet (100 mg total) by mouth at bedtime. To be combined with 25 mg at bedtime 11/25/19  Ursula Alert, MD  QUEtiapine (SEROQUEL) 25 MG tablet Take 1 tablet (25 mg total) by mouth as directed. Start taking 1 tablet  with 100 mg at bedtime and 1 tablet daily as needed for agitation only 11/25/19   Ursula Alert, MD  rizatriptan (MAXALT) 10 MG tablet Take 1 tablet (10 mg total) by mouth as needed. 01/21/19 11/25/19  Milinda Pointer, MD  sucralfate (CARAFATE) 1 g tablet Take 1 g by mouth 4 (four) times daily.    [provider]  torsemide (DEMADEX) 20 MG tablet Take 1 tablet (20 mg total) by mouth 2 (two) times daily. 11/18/18 12/02/19  Mayo, Pete Pelt, MD  TRELEGY ELLIPTA 100-62.5-25 MCG/INH AEPB Inhale 1 puff into the lungs daily. Takes at night Patient not taking: Reported on 12/03/2019    [provider]  TRULICITY 1.5 UR/4.2HC SOPN Inject 1.5 mg into the skin once a week. Patient not taking: Reported on 12/03/2019 11/10/19   [provider]  VENTOLIN HFA 108 (90 Base) MCG/ACT inhaler Inhale 1-2 puffs into the lungs every 6 (six) hours as needed for wheezing or shortness of breath.  Patient not taking: Reported on 12/03/2019 05/03/18   [provider]  vitamin E 400 UNIT capsule 400 Units daily.     [provider]    Allergies Diazepam, Ziprasidone hcl, Azithromycin, Divalproex sodium, Levofloxacin, Lisinopril, Metronidazole, Sulfa antibiotics, Valproic acid, Cephalexin, Ciprofloxacin, and Penicillins  Family History  Problem Relation Age of Onset  . Diabetes Mellitus II Mother   . CAD Mother   . Sleep apnea Mother   . Osteoarthritis Mother   . Osteoporosis Mother   . Anxiety disorder Mother   . Depression Mother   . Bipolar disorder Mother   . Bipolar disorder Father   . Hypertension Father   . Depression Father   . Anxiety disorder Father   . Post-traumatic stress disorder Sister     Social History Social History   Tobacco Use  . Smoking status: Former Smoker    Packs/day: 0.50    Types: Cigarettes    Quit date: 09/01/2018    Years since quitting: 1.3  . Smokeless tobacco: Never Used  . Tobacco comment: Patient quit smoking 09/01/2018  Vaping Use  . Vaping Use: Never used  Substance Use Topics  . Alcohol use: No    Alcohol/week: 0.0 standard drinks  . Drug use: No    Review of Systems Constitutional: No fever/chills Eyes: No visual changes. ENT: No sore throat. Cardiovascular: Denies chest pain. Respiratory: Denies shortness of breath. Gastrointestinal: Endorses abdominal pain.  Endorses nausea, no vomiting.  No diarrhea. Genitourinary: Negative for dysuria. Musculoskeletal: Negative for acute arthralgias Skin: Negative for rash. Neurological: Negative for headaches, weakness/numbness/paresthesias in any extremity Psychiatric: Negative for suicidal ideation/homicidal ideation   ____________________________________________   PHYSICAL EXAM:  VITAL SIGNS: ED Triage Vitals  Enc Vitals Group     BP 12/26/19 0853 104/71     Pulse Rate 12/26/19 0853 (!) 132     Resp 12/26/19 0853 20     Temp 12/26/19 0853 99 F (37.2 C)     Temp Source 12/26/19 0853 Oral     SpO2 12/26/19 0853 95 %     Weight 12/26/19 0854 224 lb 13.9 oz (102 kg)     Height  12/26/19 0854 5\' 4"  (1.626 m)     Head Circumference --      Peak Flow --      Pain Score 12/26/19 0853 10  Pain Loc --      Pain Edu? --      Excl. in [place]? --    Constitutional: Alert and oriented. Well appearing and in no acute distress. Eyes: Conjunctivae are normal. PERRL. Head: Atraumatic. Nose: No congestion/rhinnorhea. Mouth/Throat: Mucous membranes are moist. Neck: No stridor Cardiovascular: Grossly normal heart sounds.  Good peripheral circulation. Respiratory: Normal respiratory effort.  No retractions. Gastrointestinal: Soft, tenderness to palpation over midline left side of the abdomen where a large hernia is appreciated without abdominal contents palpable Musculoskeletal: No obvious deformities Neurologic:  Normal speech and language. No gross focal neurologic deficits are appreciated. Skin:  Skin is warm and dry. No rash noted. Psychiatric: Mood and affect are normal. Speech and behavior are normal.  ____________________________________________   LABS (all labs ordered are listed, but only abnormal results are displayed)  Labs Reviewed  COMPREHENSIVE METABOLIC PANEL - Abnormal; Notable for the following components:      Result Value   Sodium 134 (*)    CO2 19 (*)    Glucose, Bld 141 (*)    Creatinine, Ser 1.12 (*)    AST 13 (*)    All other components within normal limits  CBC - Abnormal; Notable for the following components:   Hemoglobin 11.7 (*)    HCT 35.2 (*)    RDW 16.0 (*)    nRBC 0.4 (*)    All other components within normal limits  RESPIRATORY PANEL BY RT PCR (FLU A&B, COVID)  LIPASE, BLOOD  URINALYSIS, COMPLETE (UACMP) WITH MICROSCOPIC  POC URINE PREG, ED    RADIOLOGY  ED MD interpretation: CT of the abdomen pelvis with oral and IV contrast shows Small bowel obstruction due to a complex right paracentral anterior abdominal wall hernia containing 3 components. The top component contains adipose tissue only, the middle component contains a loop  of small bowel, and this same loop of small bowel extends back into the abdomen and into the lower hernia component.  No evidence of perforated hollow viscus  Official radiology report(s): CT Abdomen Pelvis W Contrast  Result Date: 12/26/2019 CLINICAL DATA:  Vomiting and constipation for 24 hours. EXAM: CT ABDOMEN AND PELVIS WITH CONTRAST TECHNIQUE: Multidetector CT imaging of the abdomen and pelvis was performed using the standard protocol following bolus administration of intravenous contrast. CONTRAST:  125mL OMNIPAQUE IOHEXOL 300 MG/ML  SOLN COMPARISON:  Abdominal radiograph 12/08/2019 and CT scan from 12/02/2019 FINDINGS: Body habitus reduces diagnostic sensitivity and specificity. Lower chest: Mild lingular scarring. Hepatobiliary: Unremarkable Pancreas: Unremarkable Spleen: Unremarkable Adrenals/Urinary Tract: 2.2 by 1.7 cm left adrenal mass with relative washout of 24%, indeterminate. This mass previously measured 2.1 by 1.7 cm on 09/11/2016 and accordingly is roughly stable and likely benign. Mildly distended urinary bladder. 0.5 cm hypodense lesion in the left kidney lower pole on image 30 of series 4 is technically too small to characterize although statistically likely to be a small cyst or similar benign lesion. Stomach/Bowel: The stomach is moderately distended. Small bowel dilatation extending to a complex right paracentral anterior abdominal wall hernia. There 3 components of this hernia. The first is in upper component shown on image 74 of series 7 containing only adipose tissue, and thus not contributing to the obstruction. Just below this, there is a component with a herniated loop of small bowel which appears mildly dilated. This dilated segment of small bowel extends back into the abdomen, tracks caudad, and extends out through a third right paracentral hernia. The distal portion of the loop  then extends back into the abdomen where it assumes a normal caliber. Accordingly the transition  appears to be in this bottom most component of the hernia. There is no extraluminal gas, pneumatosis, abscess, or evidence of perforation. Proximal small bowel loops measure up to 4.9 cm in diameter. The distal small bowel loops are of normal caliber. There is formed stool in the colon. Unremarkable appendix. Anastomotic staple line in the sigmoid colon. Vascular/Lymphatic: Unremarkable Reproductive: Uterus absent.  Adnexa unremarkable. Other: Scattered small mesenteric lymph nodes are likely reactive. Musculoskeletal: Bilateral degenerative hip arthropathy. Dorsal column stimulator noted. The leads are at the T7 through T10 levels. Pelvic floor laxity with low position of the anorectal junction. Hemangioma in the L2 vertebral body. IMPRESSION: 1. Small bowel obstruction due to a complex right paracentral anterior abdominal wall hernia containing 3 components. The top component contains adipose tissue only, the middle component contains a loop of small bowel, and this same loop of small bowel extends back into the abdomen and into the lower hernia component. The transition in caliber appears to be in this bottom most component of the hernia. No extraluminal gas, pneumatosis, or abscess. 2. Pelvic floor laxity with low position of the anorectal junction. 3. Bilateral degenerative hip arthropathy. 4. 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 Electronically Signed   By: Van Clines M.D.   On: 12/26/2019 12:23    ____________________________________________   PROCEDURES  Procedure(s) performed (including Critical Care):  .1-3 Lead EKG Interpretation Performed by: Naaman Plummer, MD Authorized by: Naaman Plummer, MD     Interpretation: abnormal     ECG rate:  111   ECG rate assessment: tachycardic     Rhythm: sinus tachycardia     Ectopy: none     Conduction: normal   .Critical Care Performed by:  Naaman Plummer, MD Authorized by: Naaman Plummer, MD   Critical care provider statement:    Critical care time (minutes):  43   Critical care time was exclusive of:  Separately billable procedures and treating other patients   Critical care was necessary to treat or prevent imminent or life-threatening deterioration of the following conditions: gastrointestinal crisis, SBO.   Critical care was time spent personally by me on the following activities:  Discussions with consultants, evaluation of patient's response to treatment, examination of patient, ordering and performing treatments and interventions, ordering and review of laboratory studies, ordering and review of radiographic studies, pulse oximetry, re-evaluation of patient's condition, obtaining history from patient or surrogate and review of old charts   I assumed direction of critical care for this patient from another provider in my specialty: no       ____________________________________________   INITIAL IMPRESSION / ASSESSMENT AND PLAN / ED COURSE  As part of my medical decision making, I reviewed the following data within the [place] notes reviewed and incorporated, Labs reviewed, EKG interpreted, Old chart reviewed, Radiograph reviewed and Notes from prior ED visits reviewed and incorporated       Given History, Exam I believe patient needs labs and imaging to evaluate for SBO vs other acute abdomen. ED Workup: CBC, BMP, LFTs, CT Abdomen/Pelvis ED Findings: CT: Small Bowel Obstruction  History, Exam, and Workup show no overt evidence of mesenteric ischemia, bowel gangrene, abscess, peritonitis. ED Interventions: Analgesia. Defer ABX at this time. Consult: General Surgery recommended that given the complexity of patient's hernia and  need for transfer due to this complexity in the past to contact [facility] surgery.  I spoke to the on-call general surgeon and [facility] hospital who states that she knew this  patient very well however she does not have any available beds and [facility] general surgery does not have a wait list for patients.  I spoke to Dr. Lysle Pearl from our general surgery team who agreed to accept this patient with the intent to transfer as soon as possible Disposition: Admit   Clinical Course as of Dec 25 1308  Sat Dec 26, 2019  1222 Platelets: 283 [EB]    Clinical Course User Index [EB] Naaman Plummer, MD     ____________________________________________   FINAL CLINICAL IMPRESSION(S) / ED DIAGNOSES  Final diagnoses:  None     ED Discharge Orders    None       Note:  This document was prepared using Dragon voice recognition software and may include unintentional dictation errors.   Naaman Plummer, MD 12/26/19 1318

## 2019-12-26 NOTE — Progress Notes (Signed)
   12/26/19 1521  Assess: MEWS Score  Temp 98.2 F (36.8 C)  BP 112/69  Pulse Rate (!) 114  Resp 16  Level of Consciousness Alert  SpO2 99 %  O2 Device Room Air  Assess: MEWS Score  MEWS Temp 0  MEWS Systolic 0  MEWS Pulse 2  MEWS RR 0  MEWS LOC 0  MEWS Score 2  MEWS Score Color Yellow  Assess: if the MEWS score is Yellow or Red  Were vital signs taken at a resting state? Yes  Focused Assessment No change from prior assessment  Early Detection of Sepsis Score *See Row Information* High  MEWS guidelines implemented *See Row Information* Yes  Treat  MEWS Interventions Administered scheduled meds/treatments  Pain Scale 0-10  Pain Score 9  Pain Type Acute pain  Pain Location Abdomen  Pain Orientation Mid  Pain Descriptors / Indicators Aching  Pain Frequency Constant  Patients Stated Pain Goal 2  Pain Intervention(s) Medication (See eMAR)  Escalate  MEWS: Escalate Yellow: discuss with charge nurse/RN and consider discussing with provider and RRT

## 2019-12-26 NOTE — ED Triage Notes (Signed)
Pt to ER via EMS reports "I'm throwing up my bowels from that hernia again.  It needs to be flushed again."  Pt reports 2 recent hospitalizations for same symptoms.

## 2019-12-26 NOTE — ED Notes (Signed)
CT notified by was able to take a few sips of contrast only. Dr bradler also aware.

## 2019-12-26 NOTE — ED Triage Notes (Signed)
First nurse note: Per EMS, pt c/o vomiting and constipation x24hrs. EMS report pt states due to hernia.

## 2019-12-26 NOTE — Progress Notes (Signed)
PHARMACIST - PHYSICIAN COMMUNICATION  CONCERNING:  Enoxaparin (Lovenox) for DVT Prophylaxis    RECOMMENDATION: Patient was prescribed enoxaprin 40mg  q24 hours for VTE prophylaxis.   Filed Weights   12/26/19 0854  Weight: 102 kg (224 lb 13.9 oz)    Body mass index is 38.6 kg/m.  Estimated Creatinine Clearance: 72.1 mL/min (A) (by C-G formula based on SCr of 1.12 mg/dL (H)).   Based on [place] patient is candidate for enoxaparin 0.5mg /kg TBW SQ every 24 hours based on BMI being >30.   DESCRIPTION: Pharmacy has adjusted enoxaparin dose per [facility] policy.  Patient is now receiving enoxaparin 50 mg  every 24 hours    Rowland Lathe, PharmD Clinical Pharmacist  12/26/2019 4:08 PM

## 2019-12-26 NOTE — ED Notes (Signed)
Dr bradler at bedside.

## 2019-12-26 NOTE — Progress Notes (Addendum)
Subjective:  CC: Marie Clarke is a 49 y.o. female  Hospital stay day 0,   ventral hernia  HPI: Pt states no further vomiting episodes.  Tolerated the contrast. Pain is still about the same, but controlled with pain meds.  No flatus or BM  ROS:  General: Denies weight loss, weight gain, fatigue, fevers, chills, and night sweats. Heart: Denies chest pain, palpitations, racing heart, irregular heartbeat, leg pain or swelling, and decreased activity tolerance. Respiratory: Denies breathing difficulty, shortness of breath, wheezing, cough, and sputum. GI: Denies change in appetite, heartburn, nausea, vomiting, constipation, diarrhea, and blood in stool. GU: Denies difficulty urinating, pain with urinating, urgency, frequency, blood in urine.   Objective:   Temp:  [98.1 F (36.7 C)-100.8 F (38.2 C)] 98.1 F (36.7 C) (11/13 1934) Pulse Rate:  [99-132] 99 (11/13 1934) Resp:  [15-20] 18 (11/13 1934) BP: (91-132)/(59-87) 91/62 (11/13 1934) SpO2:  [95 %-99 %] 95 % (11/13 1934) Weight:  [102 kg] 102 kg (11/13 0854)     Height: 5\' 4"  (162.6 cm) Weight: 102 kg BMI (Calculated): 38.58   Intake/Output this shift:   Intake/Output Summary (Last 24 hours) at 12/26/2019 2204 Last data filed at 12/26/2019 1600 Gross per 24 hour  Intake 90 ml  Output --  Net 90 ml    Constitutional :  alert, cooperative, appears stated age and no distress  Respiratory:  clear to auscultation bilaterally  Cardiovascular:  regular rate and rhythm  Gastrointestinal: soft, no guarding, still some TTP over incisional hernia site, but much softer than initial exam, with now palpable defect edges of hernia.  small bowel was palpable within defect, but felt soft and reducible.   Skin: Cool and moist.   Psychiatric: Normal affect, non-agitated, not confused       LABS:  CMP Latest Ref Rng & Units 12/26/2019 12/09/2019 12/08/2019  Glucose 70 - 99 mg/dL 141(H) 187(H) 90  BUN 6 - 20 mg/dL 12 6 14   Creatinine 0.44 - 1.00  mg/dL 1.12(H) 0.67 0.71  Sodium 135 - 145 mmol/L 134(L) 137 144  Potassium 3.5 - 5.1 mmol/L 3.6 3.2(L) 4.6  Chloride 98 - 111 mmol/L 103 102 112(H)  CO2 22 - 32 mmol/L 19(L) 24 20(L)  Calcium 8.9 - 10.3 mg/dL 9.0 8.9 8.7(L)  Total Protein 6.5 - 8.1 g/dL 8.0 - -  Total Bilirubin 0.3 - 1.2 mg/dL 0.6 - -  Alkaline Phos 38 - 126 U/L 95 - -  AST 15 - 41 U/L 13(L) - -  ALT 0 - 44 U/L 14 - -   CBC Latest Ref Rng & Units 12/26/2019 12/03/2019 12/02/2019  WBC 4.0 - 10.5 K/uL 5.0 2.7(L) 5.9  Hemoglobin 12.0 - 15.0 g/dL 11.7(L) 12.7 13.4  Hematocrit 36 - 46 % 35.2(L) 37.5 39.9  Platelets 150 - 400 K/uL 283 256 300    RADS: n/a Assessment:   Ventral hernia- reduced at bedside, remains tender but reducible.  Will await SBFT to ensure no further issues.  Cont home meds for now  >82min spent with patient care including discussion of goals of care directly with patient at bedside

## 2019-12-26 NOTE — Progress Notes (Signed)
  Chaplain On-Call received telephone call from the patient, who requested to receive a Bible.  Chaplain provided a Bible for the patient, and provided brief spiritual and emotional support for her.  Patient stated her appreciation for the Bible.  Munday Izeyah Deike M.Div., [facility]

## 2019-12-26 NOTE — H&P (Signed)
Subjective:   CC: Small bowel obstruction  HPI:  Marie Clarke is a 49 y.o. female who was consulted by Signa Kell for issue above.  Symptoms were first noted Today. Pain is sharp, confined to the mid abdomen, without radiation.  Associated with nausea vomiting, exacerbated by palpation.  History of recurrent small bowel obstruction secondary to a recurrent incisional hernia she has been followed by [facility] for possible elective repair of this complicated hernia, but was pending a cardiology exam due to past EKG showing possible anterior infarct.  [facility] surgeon specifically told her cardiology work-up needs to be completed prior to consideration of any surgical intervention.   Past Medical History:  has a past medical history of Anemia, Anginal pain ([place]), Anxiety, Arthritis, Asthma, Bilateral lower extremity edema, Bipolar disorder ([place]), CAD (coronary artery disease) ([place]), CHF (congestive heart failure) ([place]), Chronic pain syndrome, COPD (chronic obstructive pulmonary disease) ([place]), DDD (degenerative disc disease), lumbar, Depression ([place]), Diabetes mellitus without complication ([place]), Diabetes mellitus, type II ([place]), Drug overdose (2015), Dysrhythmia, Fibromyalgia, GERD (gastroesophageal reflux disease), Headache, Hyperlipidemia, Hypertension, Hypothyroidism, Left leg pain (04/29/2014), MI (myocardial infarction) ([place]) (2007), Muscle ache (09/16/2014), Osteoporosis, Overactive bladder, Pancreatitis ([place]), Panic attacks, PTSD (post-traumatic stress disorder), Reflex sympathetic dystrophy, Renal cyst, left, Renal insufficiency, Restless legs syndrome, Sleep apnea (06/2019), Sleep apnea, Stroke ([place]) (2010), Suicide attempt ([place]), Thyroid disease, TIA (transient ischemic attack) ([place]), and TIA (transient ischemic attack).  Past Surgical History:  Past Surgical History:  Procedure Laterality Date  . ABDOMINAL HYSTERECTOMY    . CARDIAC CATHETERIZATION    . HERNIA REPAIR  07/15/2017   [facility]  . lumbar facet      several  . prolapse rectum surgery N/A July 2016  . THORACIC LAMINECTOMY FOR SPINAL CORD STIMULATOR N/A 07/27/2019   Procedure: THORACIC SPINAL CORD STIMULATOR PLACEMENT WITH RIGHT FLANK PULSE GENERATOR;  Surgeon: [name], MD;  Location: [facility] ORS;  Service: Neurosurgery;  Laterality: N/A;  . TONSILLECTOMY      Family History: family history includes Anxiety disorder in her father and mother; Bipolar disorder in her father and mother; CAD in her mother; Depression in her father and mother; Diabetes Mellitus II in her mother; Hypertension in her father; Osteoarthritis in her mother; Osteoporosis in her mother; Post-traumatic stress disorder in her sister; Sleep apnea in her mother.  Social History:  reports that she quit smoking about 15 months ago. Her smoking use included cigarettes. She smoked 0.50 packs per day. She has never used smokeless tobacco. She reports that she does not drink alcohol and does not use drugs.  Current Medications:  Prior to Admission medications   Medication Sig Start Date End Date Taking? Authorizing Provider  Albuterol Sulfate (PROAIR RESPICLICK IN) Inhale 1-2 puffs into the lungs every 6 (six) hours as needed (wheezing or shortness of breath).    [provider]  atorvastatin (LIPITOR) 40 MG tablet Take 40 mg by mouth at bedtime.     [provider]  B Complex-C (B-COMPLEX WITH VITAMIN C) tablet Take 1 tablet by mouth daily.     [provider]  benztropine (COGENTIN) 0.5 MG tablet TAKE 1 TABLET BY MOUTH TWICE A DAY FOR ABNORMAL MOVEMENTS. 12/10/19   Ursula Alert, MD  busPIRone (BUSPAR) 15 MG tablet Take 1 tablet (15 mg total) by mouth 2 (two) times daily. 10/14/19   Ursula Alert, MD  Cholecalciferol (VITAMIN D3) 125 MCG (5000 UT) TABS Take 1 tablet by mouth daily.     [provider]  darifenacin (ENABLEX)  7.5 MG 24 hr tablet Take 7.5 mg by mouth at bedtime. Use as needed for OAB 03/18/19 03/17/20  [provider]   desvenlafaxine (PRISTIQ) 50 MG 24 hr tablet Take 1 tablet (50 mg total) by mouth at bedtime. 10/14/19   Ursula Alert, MD  dicyclomine (BENTYL) 10 MG capsule Take 10 mg by mouth 3 (three) times daily.     [provider]  famotidine (PEPCID) 20 MG tablet Take 40 mg by mouth 2 (two) times daily.  04/01/19   [provider]  fluticasone (FLONASE) 50 MCG/ACT nasal spray Place 2 sprays into both nostrils daily as needed.  06/03/19   [provider]  gabapentin (NEURONTIN) 600 MG tablet Take 1 tablet (600 mg total) by mouth 3 (three) times daily. 12/17/19 01/16/20  Milinda Pointer, MD  gabapentin (NEURONTIN) 800 MG tablet Take 1 tablet (800 mg total) by mouth at bedtime. 12/07/19 01/16/20  Milinda Pointer, MD  glipiZIDE (GLUCOTROL XL) 10 MG 24 hr tablet Take 10 mg by mouth daily with breakfast.  11/17/19 11/16/20  [provider]  hydrOXYzine (ATARAX/VISTARIL) 25 MG tablet TAKE 1 TABLET BY MOUTH EVERY 6 HOURS AS NEEDED Patient taking differently: Take 25 mg by mouth every 6 (six) hours as needed.  11/09/19   Ursula Alert, MD  levothyroxine (SYNTHROID) 175 MCG tablet Take 175 mcg by mouth daily before breakfast.    [provider]  metoprolol succinate (TOPROL-XL) 25 MG 24 hr tablet Take 25 mg by mouth daily. Takes in the evening 02/27/19   [provider]  montelukast (SINGULAIR) 10 MG tablet Take 10 mg by mouth daily. Takes in the afternoon    [provider]  nitroGLYCERIN (NITROSTAT) 0.4 MG SL tablet Place 0.4 mg under the tongue every 5 (five) minutes as needed for chest pain.     [provider]  omeprazole (PRILOSEC) 40 MG capsule Take 40 mg by mouth daily.  11/16/19   [provider]  promethazine (PHENERGAN) 25 MG tablet Take 25 mg by mouth every 6 (six) hours as needed.  11/12/19   [provider]  QUEtiapine (SEROQUEL) 100 MG tablet Take 1 tablet (100 mg total) by mouth at bedtime. To be combined with 25 mg  at bedtime 11/25/19   Ursula Alert, MD  QUEtiapine (SEROQUEL) 25 MG tablet Take 1 tablet (25 mg total) by mouth as directed. Start taking 1 tablet  with 100 mg at bedtime and 1 tablet daily as needed for agitation only 11/25/19   Ursula Alert, MD  rizatriptan (MAXALT) 10 MG tablet Take 1 tablet (10 mg total) by mouth as needed. 01/21/19 11/25/19  Milinda Pointer, MD  sucralfate (CARAFATE) 1 g tablet Take 1 g by mouth 4 (four) times daily.    [provider]  torsemide (DEMADEX) 20 MG tablet Take 1 tablet (20 mg total) by mouth 2 (two) times daily. 11/18/18 12/02/19  Mayo, Pete Pelt, MD  TRELEGY ELLIPTA 100-62.5-25 MCG/INH AEPB Inhale 1 puff into the lungs daily. Takes at night Patient not taking: Reported on 12/03/2019    [provider]  TRULICITY 1.5 VE/7.2CN SOPN Inject 1.5 mg into the skin once a week. Patient not taking: Reported on 12/03/2019 11/10/19   [provider]  VENTOLIN HFA 108 (90 Base) MCG/ACT inhaler Inhale 1-2 puffs into the lungs every 6 (six) hours as needed for wheezing or shortness of breath.  Patient not taking: Reported on 12/03/2019 05/03/18   [provider]  vitamin E 400 UNIT capsule  400 Units daily.     [provider]    Allergies:  Allergies as of 12/26/2019 - Review Complete 12/26/2019  Allergen Reaction Noted  . Diazepam Hives and Nausea And Vomiting 03/28/2015  . Ziprasidone hcl Other (See Comments) and Itching 03/06/2017  . Azithromycin Hives 08/13/2014  . Divalproex sodium Hives 01/11/2019  . Levofloxacin Hives and Rash 08/13/2014  . Lisinopril  01/21/2019  . Metronidazole Hives 08/13/2014  . Sulfa antibiotics Hives 09/16/2014  . Valproic acid Itching and Rash 01/02/2019  . Cephalexin Hives   . Ciprofloxacin Hives   . Penicillins Hives 03/28/2015    ROS:  General: Denies weight loss, weight gain, fatigue, fevers, chills, and night sweats. Eyes: Denies blurry vision, double vision, eye pain, itchy  eyes, and tearing. Ears: Denies hearing loss, earache, and ringing in ears. Nose: Denies sinus pain, congestion, infections, runny nose, and nosebleeds. Mouth/throat: Denies hoarseness, sore throat, bleeding gums, and difficulty swallowing. Heart: Denies chest pain, palpitations, racing heart, irregular heartbeat, leg pain or swelling, and decreased activity tolerance. Respiratory: Denies breathing difficulty, shortness of breath, wheezing, cough, and sputum. GI: Denies change in appetite, heartburn, nausea, vomiting, constipation, diarrhea, and blood in stool. GU: Denies difficulty urinating, pain with urinating, urgency, frequency, blood in urine. Musculoskeletal: Denies joint stiffness, pain, swelling, muscle weakness. Skin: Denies rash, itching, mass, tumors, sores, and boils Neurologic: Denies headache, fainting, dizziness, seizures, numbness, and tingling. Psychiatric: Denies depression, anxiety, difficulty sleeping, and memory loss. Endocrine: Denies heat or cold intolerance, and increased thirst or urination. Blood/lymph: Denies easy bruising, easy bruising, and swollen glands     Objective:     BP 114/73   Pulse (!) 109   Temp (!) 100.8 F (38.2 C) (Oral)   Resp 16   Ht 5\' 4"  (1.626 m)   Wt 102 kg   SpO2 95%   BMI 38.60 kg/m   Constitutional :  alert, cooperative, appears stated age and no distress  Lymphatics/Throat:  no asymmetry, masses, or scars  Respiratory:  clear to auscultation bilaterally  Cardiovascular:  tachycardia  Gastrointestinal: Obese, no overlying skin changes, palpable fullness within the former hernia site, with soft reducible bowel contents on exam with some tenderness to palpation.  The tenderness seems to have improved after the bowels seem to be reduced at bedside..   Musculoskeletal: Steady movement  Skin: Cool and moist   Psychiatric: Normal affect, non-agitated, not confused       LABS:  CMP Latest Ref Rng & Units 12/26/2019 12/09/2019  12/08/2019  Glucose 70 - 99 mg/dL 141(H) 187(H) 90  BUN 6 - 20 mg/dL 12 6 14   Creatinine 0.44 - 1.00 mg/dL 1.12(H) 0.67 0.71  Sodium 135 - 145 mmol/L 134(L) 137 144  Potassium 3.5 - 5.1 mmol/L 3.6 3.2(L) 4.6  Chloride 98 - 111 mmol/L 103 102 112(H)  CO2 22 - 32 mmol/L 19(L) 24 20(L)  Calcium 8.9 - 10.3 mg/dL 9.0 8.9 8.7(L)  Total Protein 6.5 - 8.1 g/dL 8.0 - -  Total Bilirubin 0.3 - 1.2 mg/dL 0.6 - -  Alkaline Phos 38 - 126 U/L 95 - -  AST 15 - 41 U/L 13(L) - -  ALT 0 - 44 U/L 14 - -   CBC Latest Ref Rng & Units 12/26/2019 12/03/2019 12/02/2019  WBC 4.0 - 10.5 K/uL 5.0 2.7(L) 5.9  Hemoglobin 12.0 - 15.0 g/dL 11.7(L) 12.7 13.4  Hematocrit 36 - 46 % 35.2(L) 37.5 39.9  Platelets 150 - 400 K/uL 283 256 300  RADS: CLINICAL DATA:  Vomiting and constipation for 24 hours.  EXAM: CT ABDOMEN AND PELVIS WITH CONTRAST  TECHNIQUE: Multidetector CT imaging of the abdomen and pelvis was performed using the standard protocol following bolus administration of intravenous contrast.  CONTRAST:  117mL OMNIPAQUE IOHEXOL 300 MG/ML  SOLN  COMPARISON:  Abdominal radiograph 12/08/2019 and CT scan from 12/02/2019  FINDINGS: Body habitus reduces diagnostic sensitivity and specificity.  Lower chest: Mild lingular scarring.  Hepatobiliary: Unremarkable  Pancreas: Unremarkable  Spleen: Unremarkable  Adrenals/Urinary Tract: 2.2 by 1.7 cm left adrenal mass with relative washout of 24%, indeterminate. This mass previously measured 2.1 by 1.7 cm on 09/11/2016 and accordingly is roughly stable and likely benign.  Mildly distended urinary bladder. 0.5 cm hypodense lesion in the left kidney lower pole on image 30 of series 4 is technically too small to characterize although statistically likely to be a small cyst or similar benign lesion.  Stomach/Bowel: The stomach is moderately distended. Small bowel dilatation extending to a complex right paracentral anterior abdominal wall  hernia. There 3 components of this hernia. The first is in upper component shown on image 74 of series 7 containing only adipose tissue, and thus not contributing to the obstruction. Just below this, there is a component with a herniated loop of small bowel which appears mildly dilated. This dilated segment of small bowel extends back into the abdomen, tracks caudad, and extends out through a third right paracentral hernia. The distal portion of the loop then extends back into the abdomen where it assumes a normal caliber. Accordingly the transition appears to be in this bottom most component of the hernia. There is no extraluminal gas, pneumatosis, abscess, or evidence of perforation. Proximal small bowel loops measure up to 4.9 cm in diameter. The distal small bowel loops are of normal caliber.  There is formed stool in the colon. Unremarkable appendix. Anastomotic staple line in the sigmoid colon.  Vascular/Lymphatic: Unremarkable  Reproductive: Uterus absent.  Adnexa unremarkable.  Other: Scattered small mesenteric lymph nodes are likely reactive.  Musculoskeletal: Bilateral degenerative hip arthropathy. Dorsal column stimulator noted. The leads are at the T7 through T10 levels.  Pelvic floor laxity with low position of the anorectal junction.  Hemangioma in the L2 vertebral body.  IMPRESSION: 1. Small bowel obstruction due to a complex right paracentral anterior abdominal wall hernia containing 3 components. The top component contains adipose tissue only, the middle component contains a loop of small bowel, and this same loop of small bowel extends back into the abdomen and into the lower hernia component. The transition in caliber appears to be in this bottom most component of the hernia. No extraluminal gas, pneumatosis, or abscess. 2. Pelvic floor laxity with low position of the anorectal junction. 3. Bilateral degenerative hip arthropathy. 4. 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   Electronically Signed   By: Van Clines M.D.   On: 12/26/2019 12:23 Assessment:   Small bowel obstruction Hypertension Anxiety Hypothyroidism Chronic pain Non-insulin-dependent diabetes  Plan:   Physical exam reassuring that hernia likely was able to be reduced.  Will confirm with Gastrografin study to ensure no persistent obstruction noted, and also continue with serial abdominal exams in the meantime she did have extensive distention of her stomach on the CT scan so still recommended NG tube but the patient still is refusing at this time I specifically stated that aspiration pneumonitis is a potential consequence  of not placing an NG tube and can potentially be lethal.  Patient verbalized understanding and still wishes to postpone any NG tube placement until she gets actively nauseous again.  We will proceed with a Gastrografin study by allowing her to take the contrast orally as tolerated.  We will reach out to cardiology to further assess possible need for work-up in preparation for eventual surgery hope is this hernia contents were reduced at bedside so no urgent surgery will be necessary, but will like to optimize her as much as possible in the meantime.  Continue all home meds for her chronic issues in the meantime.  Sliding scale insulin for her diabetes.

## 2019-12-27 ENCOUNTER — Inpatient Hospital Stay: Payer: Medicare HMO

## 2019-12-27 LAB — PHOSPHORUS: Phosphorus: 4.5 mg/dL (ref 2.5–4.6)

## 2019-12-27 LAB — CBC
HCT: 38.3 % (ref 36.0–46.0)
Hemoglobin: 12.1 g/dL (ref 12.0–15.0)
MCH: 28.9 pg (ref 26.0–34.0)
MCHC: 31.6 g/dL (ref 30.0–36.0)
MCV: 91.6 fL (ref 80.0–100.0)
Platelets: 315 10*3/uL (ref 150–400)
RBC: 4.18 MIL/uL (ref 3.87–5.11)
RDW: 16.4 % — ABNORMAL HIGH (ref 11.5–15.5)
WBC: 3.3 10*3/uL — ABNORMAL LOW (ref 4.0–10.5)
nRBC: 0 % (ref 0.0–0.2)

## 2019-12-27 LAB — GLUCOSE, CAPILLARY
Glucose-Capillary: 82 mg/dL (ref 70–99)
Glucose-Capillary: 78 mg/dL (ref 70–99)
Glucose-Capillary: 79 mg/dL (ref 70–99)
Glucose-Capillary: 83 mg/dL (ref 70–99)

## 2019-12-27 LAB — BASIC METABOLIC PANEL
Anion gap: 13 (ref 5–15)
BUN: 16 mg/dL (ref 6–20)
CO2: 22 mmol/L (ref 22–32)
Calcium: 8.9 mg/dL (ref 8.9–10.3)
Chloride: 107 mmol/L (ref 98–111)
Creatinine, Ser: 0.87 mg/dL (ref 0.44–1.00)
GFR, Estimated: >60 mL/min (ref >60)
Glucose, Bld: 89 mg/dL (ref 70–99)
Potassium: 3.9 mmol/L (ref 3.5–5.1)
Sodium: 142 mmol/L (ref 135–145)

## 2019-12-27 LAB — MAGNESIUM: Magnesium: 1.8 mg/dL (ref 1.7–2.4)

## 2019-12-27 MED ORDER — PROMETHAZINE HCL 25 MG/ML IJ SOLN
25.0000 mg | Freq: Four times a day (QID) | INTRAMUSCULAR | Status: DC | PRN
  Administered 2019-12-27 (×2): 25 mg via INTRAVENOUS
  Filled 2019-12-27 (×2): qty 1

## 2019-12-27 MED ORDER — LORAZEPAM 2 MG/ML IJ SOLN
0.5000 mg | Freq: Once | INTRAMUSCULAR | Status: AC
  Administered 2019-12-27: 0.5 mg via INTRAVENOUS
  Filled 2019-12-27: qty 1

## 2019-12-27 NOTE — Progress Notes (Signed)
Subjective:  CC: Marie Clarke is a 49 y.o. female  Hospital stay day 1,   ventral hernia  HPI: Small bowel follow-through with persistent contrast noted within the stomach despite the hernia being reduced at bedside.  She stated that the pain was slightly better in the area but is starting to get worse again along with episodes of vomiting.  She has been adamantly refusing her NG tube until Ativan was given.  ROS:  General: Denies weight loss, weight gain, fatigue, fevers, chills, and night sweats. Heart: Denies chest pain, palpitations, racing heart, irregular heartbeat, leg pain or swelling, and decreased activity tolerance. Respiratory: Denies breathing difficulty, shortness of breath, wheezing, cough, and sputum. GI: Denies change in appetite, heartburn, nausea, vomiting, constipation, diarrhea, and blood in stool. GU: Denies difficulty urinating, pain with urinating, urgency, frequency, blood in urine.   Objective:   Temp:  [97.5 F (36.4 C)-99 F (37.2 C)] 97.5 F (36.4 C) (11/14 0600) Pulse Rate:  [94-114] 103 (11/14 0600) Resp:  [16-18] 16 (11/14 0600) BP: (91-117)/(59-83) 109/83 (11/14 0600) SpO2:  [95 %-99 %] 98 % (11/14 0600)     Height: 5\' 4"  (162.6 cm) Weight: 102 kg BMI (Calculated): 38.58   Intake/Output this shift:   Intake/Output Summary (Last 24 hours) at 12/27/2019 1508 Last data filed at 12/27/2019 1430 Gross per 24 hour  Intake 823.56 ml  Output 1300 ml  Net -476.44 ml    Constitutional :  alert, cooperative, appears stated age and no distress  Respiratory:  clear to auscultation bilaterally  Cardiovascular:  regular rate and rhythm  Gastrointestinal: soft, no guarding, increasing TTP over incisional hernia site, but still soft with palpable defect edges of hernia.  small bowel was palpable within defect, still soft and reducible.   Skin: Cool and moist.   Psychiatric: Normal affect, non-agitated, not confused       LABS:  CMP Latest Ref Rng & Units  12/27/2019 12/26/2019 12/09/2019  Glucose 70 - 99 mg/dL 89 141(H) 187(H)  BUN 6 - 20 mg/dL 16 12 6   Creatinine 0.44 - 1.00 mg/dL 0.87 1.12(H) 0.67  Sodium 135 - 145 mmol/L 142 134(L) 137  Potassium 3.5 - 5.1 mmol/L 3.9 3.6 3.2(L)  Chloride 98 - 111 mmol/L 107 103 102  CO2 22 - 32 mmol/L 22 19(L) 24  Calcium 8.9 - 10.3 mg/dL 8.9 9.0 8.9  Total Protein 6.5 - 8.1 g/dL - 8.0 -  Total Bilirubin 0.3 - 1.2 mg/dL - 0.6 -  Alkaline Phos 38 - 126 U/L - 95 -  AST 15 - 41 U/L - 13(L) -  ALT 0 - 44 U/L - 14 -   CBC Latest Ref Rng & Units 12/27/2019 12/26/2019 12/03/2019  WBC 4.0 - 10.5 K/uL 3.3(L) 5.0 2.7(L)  Hemoglobin 12.0 - 15.0 g/dL 12.1 11.7(L) 12.7  Hematocrit 36 - 46 % 38.3 35.2(L) 37.5  Platelets 150 - 400 K/uL 315 283 256    RADS: CLINICAL DATA:  Small-bowel obstruction  EXAM: PORTABLE ABDOMEN - 1 VIEW  COMPARISON:  CT 12/26/2019  FINDINGS: Multiple gas-filled dilated loops of small bowel are seen within the mid abdomen. There is gas noted within a nondistended colon. These findings are compatible with a CT examination performed earlier. Administered oral contrast remains largely within the stomach. No gross free intraperitoneal gas. Surgical clips and surgical anastomotic staple line noted within the pelvis. Dorsal column stimulator leads again noted.  IMPRESSION: Stable findings of a small-bowel obstruction, similar to prior CT examination. Administered oral  contrast remains largely within the stomach.   Electronically Signed   By: Fidela Salisbury MD   On: 12/27/2019 02:00 Assessment:   Ventral hernia- reduced at bedside, still remains tender but reducible.  Small bowel follow-through unfortunately showing intermittent bowel obstruction and/or ileus.  Patient is now actively vomiting so NG tube was placed at bedside for symptom control.  Due to the persistent nature of her pain, I have reached out to [facility] the past couple days for possible urgent transfer and more  definitive repair of her ventral hernia.  They have no bed so far.  Now awaiting callback from [place] at this point.  We will continue with symptomatic management and ensure the hernia remains reducible, possibility of urgent take back to the operating room if the hernia becomes incarcerated or she shows any signs of clinical deterioration.  Discussed case with cardiology as well in person, pending echo to see if there is any true cardiac dysfunction.  Cont home meds for now for her pre-existing conditions

## 2019-12-27 NOTE — Progress Notes (Signed)
Pt has transferred out via [facility] ground transport. Report given to [employer].

## 2019-12-27 NOTE — Progress Notes (Signed)
Report given to Nira Conn, RN at [facility]. Pt going to bed 2A  Bed 8. Transport to [employer] (ground transport) notified.

## 2019-12-27 NOTE — Discharge Summary (Signed)
Physician Discharge Summary  Patient ID: Marie Clarke MRN: [id number] DOB/AGE: 07/12/1970 49 y.o.  Admit date: 12/26/2019 Discharge date: 12/27/19   Admission Diagnoses: sbo  Discharge Diagnoses:  Same as above  Discharged Condition: stable  Hospital Course: admitted for above secondary to recurrent ventral hernia, complex.  Reduced at bedside, but symptoms persisted with pain worsening over hernia during hospitalization, with concern of persistent incarceration.  SBFT with ileus vs persistent SBO noted so request for transfer made for definitive repair of recurrent complex hernia beyond scope of practice at [facility].  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  Currently asymptomatic from that standoint.   Stable for discharge  Consults: cardiology  Discharge Exam: Blood pressure 109/83, pulse (!) 103, temperature (!) 97.5 F (36.4 C), temperature source Axillary, resp. rate 16, height 5\' 4"  (1.626 m), weight 102 kg, SpO2 98 %. GI: soft, no guarding, focal TTP over hernia again, but no frank peritonitis  Disposition:  Discharge disposition: [place] Not Defined       Discharge Instructions    Discharge patient   Complete by: As directed    Discharge disposition: [place] Not Defined   Discharge patient date: 12/27/2019     Allergies as of 12/27/2019      Reactions   Diazepam Hives, Nausea And Vomiting   Ziprasidone Hcl Other (See Comments), Itching   CONFUSED CONFUSED Other reaction(s): Other (See Comments) CONFUSED CONFUSED   Azithromycin Hives   Divalproex Sodium Hives   Levofloxacin Hives, Rash   1/31: would like to retry since not taking valium   Lisinopril    Medicine cause kidney failure   Metronidazole Hives   1/31: would like to retry since she is no longer taking valium   Sulfa Antibiotics Hives   Valproic Acid Itching, Rash   Cephalexin Hives    Ciprofloxacin Hives   Penicillins Hives   Has patient had a PCN reaction causing immediate rash, facial/tongue/throat swelling, SOB or lightheadedness with hypotension: No Has patient had a PCN reaction causing severe rash involving mucus membranes or skin necrosis: No Has patient had a PCN reaction that required hospitalization: No Has patient had a PCN reaction occurring within the last 10 years: No If all of the above answers are "NO", then may proceed with Cephalosporin use.      Medication List    STOP taking these medications   atorvastatin 40 MG tablet Commonly known as: LIPITOR   B-complex with vitamin C tablet   benztropine 0.5 MG tablet Commonly known as: COGENTIN   busPIRone 15 MG tablet Commonly known as: BUSPAR   darifenacin 7.5 MG 24 hr tablet Commonly known as: ENABLEX   desvenlafaxine 50 MG 24 hr tablet Commonly known as: PRISTIQ   dicyclomine 10 MG capsule Commonly known as: BENTYL   famotidine 20 MG tablet Commonly known as: PEPCID   fluticasone 50 MCG/ACT nasal spray Commonly known as: FLONASE   gabapentin 600 MG tablet Commonly known as: NEURONTIN   gabapentin 800 MG tablet Commonly known as: Neurontin   glipiZIDE 10 MG 24 hr tablet Commonly known as: GLUCOTROL XL   hydrOXYzine 25 MG tablet Commonly known as: ATARAX/VISTARIL   levothyroxine 175 MCG tablet Commonly known as: SYNTHROID   metoprolol succinate 25 MG 24 hr tablet Commonly known as: TOPROL-XL   montelukast 10 MG tablet Commonly known as: SINGULAIR   nitroGLYCERIN 0.4 MG SL tablet Commonly known as: NITROSTAT  omeprazole 40 MG capsule Commonly known as: PRILOSEC   PROAIR RESPICLICK IN   promethazine 25 MG tablet Commonly known as: PHENERGAN   QUEtiapine 100 MG tablet Commonly known as: SEROquel   QUEtiapine 25 MG tablet Commonly known as: SEROquel   rizatriptan 10 MG tablet Commonly known as: MAXALT   sucralfate 1 g tablet Commonly known as: CARAFATE    torsemide 20 MG tablet Commonly known as: DEMADEX   Trelegy Ellipta 100-62.5-25 MCG/INH Aepb Generic drug: Fluticasone-Umeclidin-Vilant   Trulicity 1.5 QA/8.3MH Sopn Generic drug: Dulaglutide   Ventolin HFA 108 (90 Base) MCG/ACT inhaler Generic drug: albuterol   Vitamin D3 125 MCG (5000 UT) Tabs   vitamin E 180 MG (400 UNITS) capsule         Total time spent arranging discharge was >20min. Signed: Benjamine Sprague 12/27/2019, 7:04 PM

## 2019-12-27 NOTE — Progress Notes (Signed)
Pt had a CBG of 65. Per protocol, 41mL of dextrose 50% IV was given. Rechecked CBG: 145. Pt has been NPO. Will continue to monitor.

## 2019-12-27 NOTE — Progress Notes (Signed)
Pt notified me this morning that during her xray last night the staff member accidentally knocked her dvd player off the edge of the bed, and now it no longer works. The screen is cracked, and she is very upset about it. I notified [facility] & directors, to see if there is something that could be done to replace pt's dvd player.

## 2019-12-27 NOTE — Progress Notes (Signed)
Mobility Specialist - Progress Note   12/27/19 1400  Mobility  Activity Refused mobility  Mobility performed by Mobility specialist    Pt was encouraged to participate in mobility session this date, however, pt declined d/t feeling "worn out from NG tube insertion." Will attempt session at another date/time.    Kathee Delton Mobility Specialist 12/27/19, 2:09 PM

## 2019-12-28 ENCOUNTER — Telehealth: Payer: Self-pay

## 2019-12-28 ENCOUNTER — Other Ambulatory Visit: Payer: Self-pay

## 2019-12-28 ENCOUNTER — Other Ambulatory Visit: Payer: Self-pay | Admitting: Psychiatry

## 2019-12-28 DIAGNOSIS — F3160 Bipolar disorder, current episode mixed, unspecified: Secondary | ICD-10-CM

## 2019-12-28 DIAGNOSIS — F4310 Post-traumatic stress disorder, unspecified: Secondary | ICD-10-CM

## 2019-12-28 DIAGNOSIS — F410 Panic disorder [episodic paroxysmal anxiety] without agoraphobia: Secondary | ICD-10-CM

## 2019-12-28 DIAGNOSIS — F3178 Bipolar disorder, in full remission, most recent episode mixed: Secondary | ICD-10-CM

## 2019-12-28 MED ORDER — BUSPIRONE HCL 15 MG PO TABS: 15.0000 mg | ORAL_TABLET | Freq: Two times a day (BID) | ORAL | 0 refills | Status: DC

## 2019-12-28 NOTE — Telephone Encounter (Signed)
pt called left message that she needs a refill on the buspar.

## 2019-12-28 NOTE — Telephone Encounter (Signed)
I have sent BuSpar to pharmacy. 

## 2019-12-29 ENCOUNTER — Telehealth: Payer: Self-pay | Admitting: Psychiatry

## 2019-12-29 NOTE — Telephone Encounter (Signed)
ste[facility] called from [phone] states that pt has been in and out of the hospital and is having a difficult time.  pt needs something for her anxiety. and she needs to see you sooner that next month.

## 2019-12-29 NOTE — Telephone Encounter (Signed)
It was sent over last evening , please ask to contact pharmacy.

## 2019-12-29 NOTE — Telephone Encounter (Signed)
Ok sounds good , please try to put her in sooner

## 2019-12-29 NOTE — Telephone Encounter (Signed)
spoke with lea at the front desk she is going to call patient and see if pt can do this friday.

## 2020-01-01 ENCOUNTER — Encounter: Payer: Self-pay | Admitting: Psychiatry

## 2020-01-01 ENCOUNTER — Telehealth (INDEPENDENT_AMBULATORY_CARE_PROVIDER_SITE_OTHER): Payer: Medicare HMO | Admitting: Psychiatry

## 2020-01-01 ENCOUNTER — Other Ambulatory Visit: Payer: Self-pay

## 2020-01-01 DIAGNOSIS — F4310 Post-traumatic stress disorder, unspecified: Secondary | ICD-10-CM

## 2020-01-01 DIAGNOSIS — F410 Panic disorder [episodic paroxysmal anxiety] without agoraphobia: Secondary | ICD-10-CM | POA: Diagnosis not present

## 2020-01-01 DIAGNOSIS — F3178 Bipolar disorder, in full remission, most recent episode mixed: Secondary | ICD-10-CM

## 2020-01-01 MED ORDER — LORAZEPAM 0.5 MG PO TABS: 0.5000 mg | ORAL_TABLET | ORAL | 0 refills | Status: DC

## 2020-01-01 MED ORDER — DESVENLAFAXINE SUCCINATE ER 100 MG PO TB24: 100.0000 mg | ORAL_TABLET | Freq: Every day | ORAL | 0 refills | Status: DC

## 2020-01-01 NOTE — Patient Instructions (Signed)
Lorazepam tablets What is this medicine? LORAZEPAM (lor A ze pam) is a benzodiazepine. It is used to treat anxiety. This medicine may be used for other purposes; ask your health care provider or pharmacist if you have questions. COMMON BRAND NAME(S): Ativan What should I tell my health care provider before I take this medicine? They need to know if you have any of these conditions:  glaucoma  history of drug or alcohol abuse problem  kidney disease  liver disease  lung or breathing disease, like asthma  mental illness  myasthenia gravis  Parkinson's disease  suicidal thoughts, plans, or attempt; a previous suicide attempt by you or a family member  an unusual or allergic reaction to lorazepam, other medicines, foods, dyes, or preservatives  pregnant or trying to get pregnant  breast-feeding How should I use this medicine? Take this medicine by mouth with a glass of water. Follow the directions on the prescription label. Take your medicine at regular intervals. Do not take it more often than directed. Do not stop taking except on your doctor's advice. A special MedGuide will be given to you by the pharmacist with each prescription and refill. Be sure to read this information carefully each time. Talk to your pediatrician regarding the use of this medicine in children. While this drug may be used in children as young as 12 years for selected conditions, precautions do apply. Overdosage: If you think you have taken too much of this medicine contact a poison control center or emergency room at once. NOTE: This medicine is only for you. Do not share this medicine with others. What if I miss a dose? If you miss a dose, take it as soon as you can. If it is almost time for your next dose, take only that dose. Do not take double or extra doses. What may interact with this medicine? Do not take this medicine with any of the following medications:  narcotic medicines for  cough  sodium oxybate This medicine may also interact with the following medications:  alcohol  antihistamines for allergy, cough and cold  certain medicines for anxiety or sleep  certain medicines for depression, like amitriptyline, fluoxetine, sertraline  certain medicines for seizures like carbamazepine, phenobarbital, phenytoin, primidone  general anesthetics like lidocaine, pramoxine, tetracaine  MAOIs like Carbex, Eldepryl, Marplan, Nardil, and Parnate  medicines that relax muscles for surgery  narcotic medicines for pain  phenothiazines like chlorpromazine, mesoridazine, prochlorperazine, thioridazine This list may not describe all possible interactions. Give your health care provider a list of all the medicines, herbs, non-prescription drugs, or dietary supplements you use. Also tell them if you smoke, drink alcohol, or use illegal drugs. Some items may interact with your medicine. What should I watch for while using this medicine? Tell your doctor or health care professional if your symptoms do not start to get better or if they get worse. Do not stop taking except on your doctor's advice. You may develop a severe reaction. Your doctor will tell you how much medicine to take. You may get drowsy or dizzy. Do not drive, use machinery, or do anything that needs mental alertness until you know how this medicine affects you. To reduce the risk of dizzy and fainting spells, do not stand or sit up quickly, especially if you are an older patient. Alcohol may increase dizziness and drowsiness. Avoid alcoholic drinks. If you are taking another medicine that also causes drowsiness, you may have more side effects. Give your health care provider a  list of all medicines you use. Your doctor will tell you how much medicine to take. Do not take more medicine than directed. Call emergency for help if you have problems breathing or unusual sleepiness. What side effects may I notice from  receiving this medicine? Side effects that you should report to your doctor or health care professional as soon as possible:  allergic reactions like skin rash, itching or hives, swelling of the face, lips, or tongue  breathing problems  confusion  loss of balance or coordination  signs and symptoms of low blood pressure like dizziness; feeling faint or lightheaded, falls; unusually weak or tired  suicidal thoughts or other mood changes Side effects that usually do not require medical attention (report to your doctor or health care professional if they continue or are bothersome):  dizziness  headache  nausea, vomiting  tiredness This list may not describe all possible side effects. Call your doctor for medical advice about side effects. You may report side effects to FDA at 1-800-FDA-1088. Where should I keep my medicine? Keep out of the reach of children. This medicine can be abused. Keep your medicine in a safe place to protect it from theft. Do not share this medicine with anyone. Selling or giving away this medicine is dangerous and against the law. This medicine may cause accidental overdose and death if taken by other adults, children, or pets. Mix any unused medicine with a substance like cat litter or coffee grounds. Then throw the medicine away in a sealed container like a sealed bag or a coffee can with a lid. Do not use the medicine after the expiration date. Store at room temperature between 20 and 25 degrees C (68 and 77 degrees F). Protect from light. Keep container tightly closed. NOTE: This sheet is a summary. It may not cover all possible information. If you have questions about this medicine, talk to your doctor, pharmacist, or health care provider.  2020 Elsevier/Gold Standard (2014-10-28 15:54:27)

## 2020-01-01 NOTE — Progress Notes (Signed)
Virtual Visit via Telephone Note  I connected with Marie Clarke on 01/01/20 at  9:30 AM EST by telephone and verified that I am speaking with the correct person using two identifiers.  Location Provider Location : [facility] Patient Location : Home  Participants: Patient , Provider   I discussed the limitations, risks, security and privacy concerns of performing an evaluation and management service by telephone and the availability of in person appointments. I also discussed with the patient that there may be a patient responsible charge related to this service. The patient expressed understanding and agreed to proceed.  I discussed the assessment and treatment plan with the patient. The patient was provided an opportunity to ask questions and all were answered. The patient agreed with the plan and demonstrated an understanding of the instructions.  The patient was advised to call back or seek an in-person evaluation if the symptoms worsen or if the condition fails to improve as anticipated.   [facility] MD OP Progress Note  01/01/2020 9:55 AM Marie Clarke  MRN:  [id number]  Chief Complaint:  Chief Complaint    Follow-up     HPI: Marie Clarke is a 49 year old Caucasian female, single, lives in [place], has a history of bipolar disorder, PTSD, panic attacks, recurrent ventral hernia coronary artery disease, OSA on CPAP, COPD, hyperlipidemia, hypertension, hypothyroidism, abnormal involuntary movement was evaluated by phone today.  Patient today reports she is currently not doing well physically.  She reports she got admitted to the hospital for several days recently for a ventral hernia hernia and small bowel obstruction.  She was initially evaluated and managed at [facility] and later on transferred to [facility].  She reports she has upcoming appointment for her surgery in December.  She also reports she has psychosocial stressors of her uncle being hospitalized.  She reports with everything going on her  anxiety has worsened.  She wonders whether she can be prescribed Ativan since it helped her in the past.  She has not been very compliant with therapy since she was hospitalized recently however agrees to restart therapy.  She is compliant on her Pristiq and other medications as prescribed.  She reports sleep is good.  Patient denies any suicidality, homicidality or perceptual disturbances.  Patient denies any other concerns today.  Visit Diagnosis:    ICD-10-CM   1. Bipolar disorder, in full remission, most recent episode mixed ([place])  F31.78   2. PTSD (post-traumatic stress disorder)  F43.10 desvenlafaxine (PRISTIQ) 100 MG 24 hr tablet  3. Panic attacks  F41.0 LORazepam (ATIVAN) 0.5 MG tablet    desvenlafaxine (PRISTIQ) 100 MG 24 hr tablet    Past Psychiatric History: I have reviewed past psychiatric history from my progress note on 03/06/2017.  Past Medical History:  Past Medical History:  Diagnosis Date  . Anemia   . Anginal pain ([place])   . Anxiety   . Arthritis   . Asthma   . Bilateral lower extremity edema   . Bipolar disorder ([place])   . CAD (coronary artery disease) unk  . CHF (congestive heart failure) ([place])   . Chronic pain syndrome   . COPD (chronic obstructive pulmonary disease) ([facility])    emphysema  . DDD (degenerative disc disease), lumbar   . Depression unk  . Diabetes mellitus without complication ([place])   . Diabetes mellitus, type II ([place])   . Drug overdose 05/15/13   after dad died  . Dysrhythmia    history of tachy arrythmias  . Fibromyalgia   .  GERD (gastroesophageal reflux disease)   . Headache    chronic migraines  . Hyperlipidemia   . Hypertension   . Hypothyroidism   . Left leg pain 04/29/2014  . MI (myocardial infarction) ([place]) 2007  . Muscle ache 09/16/2014  . Osteoporosis   . Overactive bladder   . Pancreatitis unk  . Panic attacks   . PTSD (post-traumatic stress disorder)   . Reflex sympathetic dystrophy   . Renal cyst, left   . Renal  insufficiency    ckd stage iii  . Restless legs syndrome   . Sleep apnea 06/2019   uses cpap  . Sleep apnea   . Stroke ([place]) 2010   TIA x 2. no residual deficits  . Suicide attempt [facility])    after father died.  . Thyroid disease    thyroid nodule  . TIA (transient ischemic attack) unk  . TIA (transient ischemic attack)     Past Surgical History:  Procedure Laterality Date  . ABDOMINAL HYSTERECTOMY    . CARDIAC CATHETERIZATION    . HERNIA REPAIR  07/15/2017   [facility]  . lumbar facet     several  . prolapse rectum surgery N/A July 2016  . THORACIC LAMINECTOMY FOR SPINAL CORD STIMULATOR N/A 07/27/2019   Procedure: THORACIC SPINAL CORD STIMULATOR PLACEMENT WITH RIGHT FLANK PULSE GENERATOR;  Surgeon: [name], MD;  Location: [facility] ORS;  Service: Neurosurgery;  Laterality: N/A;  . TONSILLECTOMY      Family Psychiatric History: I have reviewed family psychiatric history from my progress note on 03/06/2017.  Family History:  Family History  Problem Relation Age of Onset  . Diabetes Mellitus II Mother   . CAD Mother   . Sleep apnea Mother   . Osteoarthritis Mother   . Osteoporosis Mother   . Anxiety disorder Mother   . Depression Mother   . Bipolar disorder Mother   . Bipolar disorder Father   . Hypertension Father   . Depression Father   . Anxiety disorder Father   . Post-traumatic stress disorder Sister     Social History: I have reviewed social history from my progress note on 03/06/2017. Social History   Socioeconomic History  . Marital status: Single    Spouse name: Not on file  . Number of children: 0  . Years of education: Not on file  . Highest education level: High school graduate  Occupational History    Comment: not employed  Tobacco Use  . Smoking status: Former Smoker    Packs/day: 0.50    Types: Cigarettes    Quit date: 09/01/2018    Years since quitting: 1.3  . Smokeless tobacco: Never Used  . Tobacco comment: Patient quit smoking 09/01/2018  Vaping  Use  . Vaping Use: Never used  Substance and Sexual Activity  . Alcohol use: No    Alcohol/week: 0.0 standard drinks  . Drug use: No  . Sexual activity: Not Currently  Other Topics Concern  . Not on file  Social History Narrative   Patient lives with aunt and uncle.   Social Determinants of Health   Financial Resource Strain:   . Difficulty of Paying Living Expenses: Not on file  Food Insecurity:   . Worried About Charity fundraiser in the Last Year: Not on file  . Ran Out of Food in the Last Year: Not on file  Transportation Needs:   . Lack of Transportation (Medical): Not on file  . Lack of Transportation (Non-Medical): Not on file  Physical Activity:   . Days of Exercise per Week: Not on file  . Minutes of Exercise per Session: Not on file  Stress:   . Feeling of Stress : Not on file  Social Connections:   . Frequency of Communication with Friends and Family: Not on file  . Frequency of Social Gatherings with Friends and Family: Not on file  . Attends Religious Services: Not on file  . Active Member of Clubs or Organizations: Not on file  . Attends Archivist Meetings: Not on file  . Marital Status: Not on file    Allergies:  Allergies  Allergen Reactions  . Diazepam Hives and Nausea And Vomiting  . Ziprasidone Hcl Other (See Comments) and Itching    CONFUSED CONFUSED Other reaction(s): Other (See Comments) CONFUSED CONFUSED   . Azithromycin Hives  . Divalproex Sodium Hives  . Levofloxacin Hives and Rash    1/31: would like to retry since not taking valium  . Lisinopril     Medicine cause kidney failure  . Metronidazole Hives    1/31: would like to retry since she is no longer taking valium  . Sulfa Antibiotics Hives  . Valproic Acid Itching and Rash  . Cephalexin Hives  . Ciprofloxacin Hives  . Penicillins Hives    Has patient had a PCN reaction causing immediate rash, facial/tongue/throat swelling, SOB or lightheadedness with hypotension:  No Has patient had a PCN reaction causing severe rash involving mucus membranes or skin necrosis: No Has patient had a PCN reaction that required hospitalization: No Has patient had a PCN reaction occurring within the last 10 years: No If all of the above answers are "NO", then may proceed with Cephalosporin use.     Metabolic Disorder Labs: Lab Results  Component Value Date   HGBA1C 6.4 (H) 12/03/2019   MPG 137 12/03/2019   MPG 134.11 09/29/2018   No results found for: PROLACTIN No results found for: CHOL, TRIG, HDL, CHOLHDL, VLDL, LDLCALC Lab Results  Component Value Date   TSH 2.296 11/17/2018   TSH 3.335 09/29/2018    Therapeutic Level Labs: No results found for: LITHIUM Lab Results  Component Value Date   VALPROATE 33 (L) 09/29/2018   No components found for:  CBMZ  Current Medications: Current Outpatient Medications  Medication Sig Dispense Refill  . acetaminophen (TYLENOL) 325 MG tablet Take by mouth.    . fluticasone (FLONASE) 50 MCG/ACT nasal spray Place into the nose.    . ondansetron (ZOFRAN-ODT) 8 MG disintegrating tablet Take by mouth.    . Albuterol Sulfate (PROAIR RESPICLICK) 322 (90 Base) MCG/ACT AEPB Inhale into the lungs.    . benztropine (COGENTIN) 0.5 MG tablet Take by mouth.    . busPIRone (BUSPAR) 15 MG tablet Take 1 tablet (15 mg total) by mouth 2 (two) times daily. 180 tablet 0  . cyclobenzaprine (FLEXERIL) 10 MG tablet Take by mouth.    . desvenlafaxine (PRISTIQ) 100 MG 24 hr tablet Take 1 tablet (100 mg total) by mouth daily. 90 tablet 0  . dicyclomine (BENTYL) 10 MG capsule Take by mouth.    . esomeprazole (NEXIUM) 40 MG capsule Take by mouth.    . gabapentin (NEURONTIN) 600 MG tablet Take by mouth.    Marland Kitchen glipiZIDE (GLUCOTROL XL) 10 MG 24 hr tablet Take by mouth.    Marland Kitchen ibuprofen (ADVIL) 800 MG tablet Take by mouth.    . levothyroxine (SYNTHROID) 175 MCG tablet Take by mouth.    Marland Kitchen LORazepam (ATIVAN)  0.5 MG tablet Take 1 tablet (0.5 mg total) by  mouth as directed. Take 1 tablet 3-4 times a week only for severe panic attacks 15 tablet 0  . oxyCODONE (OXY IR/ROXICODONE) 5 MG immediate release tablet     . potassium chloride (KLOR-CON) 10 MEQ tablet Take 10 mEq by mouth 4 (four) times daily.    . potassium chloride SA (KLOR-CON) 20 MEQ tablet Take 20 mEq by mouth 2 (two) times daily.    . QUEtiapine (SEROQUEL) 100 MG tablet Take by mouth.    . QUEtiapine (SEROQUEL) 25 MG tablet Take by mouth.    Marland Kitchen rOPINIRole (REQUIP) 1 MG tablet Take by mouth.     No current facility-administered medications for this visit.     Musculoskeletal: Strength & Muscle Tone: UTA Gait & Station: UTA Patient leans: N/A  Psychiatric Specialty Exam: Review of Systems  Gastrointestinal: Positive for abdominal pain.  Psychiatric/Behavioral: The patient is nervous/anxious.   All other systems reviewed and are negative.   There were no vitals taken for this visit.There is no height or weight on file to calculate BMI.  General Appearance: UTA  Eye Contact:  UTA  Speech:  Clear and Coherent  Volume:  Normal  Mood:  Anxious  Affect:  UTA  Thought Process:  Goal Directed and Descriptions of Associations: Intact  Orientation:  Full (Time, Place, and Person)  Thought Content: Logical   Suicidal Thoughts:  No  Homicidal Thoughts:  No  Memory:  Immediate;   Fair Recent;   Fair Remote;   Fair  Judgement:  Fair  Insight:  Fair  Psychomotor Activity:  UTA  Concentration:  Concentration: Fair and Attention Span: Good  Recall:  [employer] of Knowledge: Fair  Language: Good  Akathisia:  No  Handed:  Right  AIMS (if indicated): UTA  Assets:  Communication Skills Desire for Improvement Social Support  ADL's:  Intact  Cognition: WNL  Sleep:  Fair   Screenings: PHQ2-9     Procedure visit from 10/22/2019 in [facility] Procedure visit from 04/22/2018 in [facility]  Procedure visit from 03/13/2018 in [facility] Office Visit from 12/23/2017 in [facility] Procedure visit from 12/05/2017 in [facility]  PHQ-2 Total Score 0 0 0 0 0       Assessment and Plan: Lacara Dunsworth is a 49 year old Caucasian female who has a history of bipolar disorder, panic attack, multiple medical problems including chronic back pain was evaluated by phone today.  Patient is currently struggling with worsening anxiety symptoms due to her health problems and other situational stressors.  Patient will benefit from medication readjustment and continued psychotherapy sessions.  Plan Bipolar disorder in remission Seroquel 125 mg p.o. nightly Seroquel 25 mg p.o. every morning. Continue Cogentin for side effects of Seroquel. BuSpar 15 mg p.o. twice daily Increase Pristiq to 100 mg p.o. nightly  PTSD-stable Continue CBT Pristiq and BuSpar as prescribed  Panic attacks-unstable Increase Pristiq to 100 mg p.o. nightly Start Ativan 0.5 mg as needed for severe panic attacks only.  Patient advised to use it a few times a week for severe panic symptoms and to avoid use if she is able to cope with her anxiety otherwise. Patient provided education about drug to drug interaction with hydrocodone.  She will avoid combination. Patient with history of Valium allergy, however per review of  medical records as well as per patient report she was given Ativan recently while she was admitted to the hospital and tolerated it well.  Patient reports she would like to try the Ativan and will monitor herself closely for any allergic reaction.  She will get help by going to the emergency department or calling 911 if she has any allergic reaction to this medication. I have reviewed [place] controlled substance database. Patient agrees to contact her psychotherapist for continued  CBT. Discussed with patient that Ativan will not be prescribed for long-term and she needs to make use of CBT as well as her other medications for anxiety control. She also has hydroxyzine 25 mg every 6 hours as needed available for anxiety.   Follow-up in clinic in 2 to 3 weeks or sooner if needed.  I have spent atleast 20 minutes non face to face with patient today. More than 50 % of the time was spent for preparing to see the patient ( e.g., review of test, records ), obtaining and to review and separately obtained history , ordering medications and test ,psychoeducation and supportive psychotherapy and care coordination,as well as documenting clinical information in electronic health record,interpreting and communication of test results This note was generated in part or whole with voice recognition software. Voice recognition is usually quite accurate but there are transcription errors that can and very often do occur. I apologize for any typographical errors that were not detected and corrected.         Marie Alert, MD 01/01/2020, 9:55 AM

## 2020-01-11 ENCOUNTER — Ambulatory Visit (HOSPITAL_COMMUNITY): Payer: Medicare HMO | Admitting: Licensed Clinical Social Worker

## 2020-01-12 ENCOUNTER — Encounter: Payer: Self-pay | Admitting: Pain Medicine

## 2020-01-13 ENCOUNTER — Ambulatory Visit: Payer: Medicare HMO | Attending: Pain Medicine | Admitting: Pain Medicine

## 2020-01-13 ENCOUNTER — Encounter: Payer: Self-pay | Admitting: Pain Medicine

## 2020-01-13 ENCOUNTER — Other Ambulatory Visit: Payer: Self-pay

## 2020-01-13 DIAGNOSIS — M79605 Pain in left leg: Secondary | ICD-10-CM

## 2020-01-13 DIAGNOSIS — M25572 Pain in left ankle and joints of left foot: Secondary | ICD-10-CM

## 2020-01-13 DIAGNOSIS — G90522 Complex regional pain syndrome I of left lower limb: Secondary | ICD-10-CM

## 2020-01-13 DIAGNOSIS — M545 Low back pain, unspecified: Secondary | ICD-10-CM

## 2020-01-13 DIAGNOSIS — Z79899 Other long term (current) drug therapy: Secondary | ICD-10-CM

## 2020-01-13 DIAGNOSIS — G8929 Other chronic pain: Secondary | ICD-10-CM

## 2020-01-13 DIAGNOSIS — G894 Chronic pain syndrome: Secondary | ICD-10-CM | POA: Diagnosis not present

## 2020-01-13 DIAGNOSIS — M792 Neuralgia and neuritis, unspecified: Secondary | ICD-10-CM

## 2020-01-13 MED ORDER — GABAPENTIN 800 MG PO TABS: ORAL_TABLET | ORAL | 0 refills | Status: DC

## 2020-01-13 NOTE — Progress Notes (Signed)
Patient: Marie Clarke  Service Category: E/M  Provider: Gaspar Cola, MD  DOB: 1970-05-25  DOS: 01/13/2020  Location: Office  MRN: [id number]  Setting: Ambulatory outpatient  Referring Provider: Sharyne Peach, MD  Type: Established Patient  Specialty: Interventional Pain Management  PCP: Sharyne Peach, MD  Location: Remote location  Delivery: TeleHealth     Virtual Encounter - Pain Management PROVIDER NOTE: Information contained herein reflects review and annotations entered in association with encounter. Interpretation of such information and data should be left to medically-trained personnel. Information provided to patient can be located elsewhere in the medical record under "Patient Instructions". Document created using STT-dictation technology, any transcriptional errors that may result from process are unintentional.    Contact & Pharmacy Preferred: [phone] Home: [phone] (home) Mobile: [phone] (mobile) E-mail: [email or web address]  [place] [place] [facility] [place] [address] [place] [address] Phone: [phone] Fax: [phone]   Pre-screening  Marie Clarke offered "in-person" vs "virtual" encounter. She indicated preferring virtual for this encounter.   Reason COVID-19*  Social distancing based on CDC and AMA recommendations.   I contacted Marie Clarke on 01/13/2020 via telephone.      I clearly identified myself as Gaspar Cola, MD. I verified that I was speaking with the correct person using two identifiers (Name: Marie Clarke, and date of birth: [birth year]).  Consent I sought verbal advanced consent from [facility] for virtual visit interactions. I informed Marie Clarke of possible security and privacy concerns, risks, and limitations associated with providing "not-in-person" medical evaluation and management services. I also informed Marie Clarke of the availability of "in-person" appointments. Finally, I informed her that there  would be a charge for the virtual visit and that she could be  personally, fully or partially, financially responsible for it. Marie Clarke expressed understanding and agreed to proceed.   Historic Elements   Marie Clarke is a 49 y.o. year old, female patient evaluated today after our last contact on 11/03/2019. Marie Clarke  has a past medical history of Anemia, Anginal pain ([place]), Anxiety, Arthritis, Asthma, Bilateral lower extremity edema, Bipolar disorder ([place]), CAD (coronary artery disease) ([place]), CHF (congestive heart failure) ([place]), Chronic pain syndrome, COPD (chronic obstructive pulmonary disease) ([place]), DDD (degenerative disc disease), lumbar, Depression ([place]), Diabetes mellitus without complication ([place]), Diabetes mellitus, type II ([place]), Drug overdose (2015), Dysrhythmia, Fibromyalgia, GERD (gastroesophageal reflux disease), Headache, Hyperlipidemia, Hypertension, Hypothyroidism, Left leg pain (04/29/2014), MI (myocardial infarction) ([place]) (2007), Muscle ache (09/16/2014), Osteoporosis, Overactive bladder, Pancreatitis ([place]), Panic attacks, PTSD (post-traumatic stress disorder), Reflex sympathetic dystrophy, Renal cyst, left, Renal insufficiency, Restless legs syndrome, Sleep apnea (06/2019), Sleep apnea, Stroke ([place]) (2010), Suicide attempt [facility]), Thyroid disease, TIA (transient ischemic attack) ([place]), and TIA (transient ischemic attack). She also  has a past surgical history that includes prolapse rectum surgery (N/A, July 2016); Tonsillectomy; Abdominal hysterectomy; Hernia repair (07/15/2017); Cardiac catheterization; lumbar facet; and Thoracic laminectomy for spinal cord stimulator (N/A, 07/27/2019). Marie Clarke has a current medication list which includes the following prescription(s): proair respiclick, benztropine, buspirone, cyclobenzaprine, desvenlafaxine, dicyclomine, esomeprazole, fluticasone, glipizide, ibuprofen, levocetirizine, levothyroxine, lorazepam, ondansetron, potassium chloride sa, quetiapine,  quetiapine, ropinirole, and gabapentin. She  reports that she quit smoking about 16 months ago. Her smoking use included cigarettes. She smoked 0.50 packs per day. She has never used smokeless tobacco. She reports that she does not drink alcohol and does not use drugs. Marie Clarke is allergic to diazepam, ziprasidone hcl, azithromycin, divalproex sodium, levofloxacin, lisinopril, metronidazole, sulfa  antibiotics, valproic acid, cephalexin, ciprofloxacin, and penicillins.   HPI  Today, she is being contacted for medication management.  Today we have contacted the patient to evaluate the gabapentin titration.  The patient had a basic metabolic panel on 95/63/8756 which indicates the patient to have adequate renal function with a GFR above 60.  She is currently taking gabapentin 600 mg p.o. twice daily and 800 mg at bedtime.  She indicates having absolutely no problems with the medication and no side effects.  She also indicates that she is getting significant benefit from the medication.  However, she is still having pain and was wondering if we could increase the dose.  I asked her if she felt that she could take the 800 mg tablet during the day without any problem since she stated that she was sure that I would not give her any issues.  Today I have discontinued the gabapentin 600 mg and I have sent a prescription to her pharmacy for gabapentin 800 mg with instructions to go up to 1 tablet p.o. 3 times daily.  The patient asked if that was the maximum those that she could have on the gabapentin and I indicated to her that the maximum recommended dose for chronic pain is 800 mg 4 times daily.  She indicated that eventually she would like to try that.  I told her that she needed to stay with the dose that provided her with adequate relief of the pain without giving her any type of side effects.  I told her that if she could achieve this at a dose of 800 mg p.o. 3 times daily, she should then stay on that.  However,  since she has been able to tolerate the medicine well and she claims that she can handle the higher dose, I have written a prescription where after 15 days of taking the 800 mg 3 times daily, if she still feels that she needs a higher dose and she is not having any side effects, she will be able to go to 800 mg p.o. 4 times daily.  I told her that I would be calling her in approximately 3 to 4 weeks to assess how she is doing on that increase.  Once we have her on a steady dose of the medication, then we will provide her with a prescription with several refills.  Once uncomfortable with how the patient takes the medication and how she is handling it, I will probably be transferring the medicine to her PCP for long-term management.  Pharmacotherapy Assessment  Analgesic: No opioid analgesics prescribed by our practice.I have discontinue all opioid analgesics on 09/09/2019 secondary to the patient's disregard towards our medication rules and regulations.  High-Risk due to history of a prior suicidal attempt w/ medications.  PMP revealed obtaining undisclosed opioid analgesics from several providers. MME/day:29m/day.   Monitoring: [place] PMP: PDMP reviewed during this encounter.       Pharmacotherapy: No side-effects or adverse reactions reported. Compliance: No problems identified. Effectiveness: Clinically acceptable. Plan: Refer to "POC".  UDS:  Summary  Date Value Ref Range Status  07/02/2019 Note  Final    Comment:    ==================================================================== [employer] Select 13 (MW) ==================================================================== Specimen Alert Note: Urinary creatinine is very low; ability to detect some drugs may be compromised; creatinine-normalized drug concentrations should be interpreted with caution. Suggest recollection. (Creatinine) ==================================================================== Test  Result       Flag       Units Drug Present and Declared for Prescription Verification   Tramadol                       >62500       EXPECTED   ng/mg creat   O-Desmethyltramadol            22813        EXPECTED   ng/mg creat   N-Desmethyltramadol            11550        EXPECTED   ng/mg creat    Source of tramadol is a prescription medication. O-desmethyltramadol    and N-desmethyltramadol are expected metabolites of tramadol. ==================================================================== Test                      Result    Flag   Units      Ref Range   Creatinine              8         LL     mg/dL      >=20 ==================================================================== Declared Medications:  The flagging and interpretation on this report are based on the  following declared medications.  Unexpected results may arise from  inaccuracies in the declared medications.  **Note: The testing scope of this panel includes these medications:  Tramadol  **Note: The testing scope of this panel does not include the  following reported medications:  Albuterol  Aspirin  Atorvastatin  Atropine (Lomotil)  Buspirone  Carvedilol (Coreg)  Celecoxib (Celebrex)  Colchicine  Cysteamine (Procysbi)  Darifenacin  Dicyclomine (Bentyl)  Diphenoxylate (Lomotil)  Dulaglutide  Eye Drops  Famotidine (Pepcid)  Fluticasone  Fluticasone (Trelegy)  Gabapentin  Hydrochlorothiazide  Hydroxyzine  Levocetirizine (Xyzal)  Levothyroxine  Magnesium (Mag-Ox)  Methocarbamol  Metoprolol  Montelukast (Singulair)  Naproxen  Nitroglycerin  Omeprazole  Ondansetron  Potassium  Prednisone  Pregabalin  Prochlorperazine (Compazine)  Promethazine  Quetiapine (Seroquel)  Rizatriptan (Maxalt)  Ropinirole (Requip)  Solifenacin (Vesicare)  Topiramate (Topamax)  Torsemide  Triamterene  Umeclidinium (Trelegy)  Vilanterol (Trelegy)  Vitamin B  Vitamin C  Vitamin D3  Vitamin  E ==================================================================== For clinical consultation, please call [phone]. ====================================================================     Laboratory Chemistry Profile   Renal Lab Results  Component Value Date   BUN 16 12/27/2019   CREATININE 0.87 12/27/2019   BCR 16 09/09/2017   GFRAA >60 10/25/2019   GFRNONAA >60 12/27/2019     Hepatic Lab Results  Component Value Date   AST 13 (L) 12/26/2019   ALT 14 12/26/2019   ALBUMIN 3.8 12/26/2019   ALKPHOS 95 12/26/2019   LIPASE 22 12/26/2019     Electrolytes Lab Results  Component Value Date   NA 142 12/27/2019   K 3.9 12/27/2019   CL 107 12/27/2019   CALCIUM 8.9 12/27/2019   MG 1.8 12/27/2019   PHOS 4.5 12/27/2019     Bone Lab Results  Component Value Date   25OHVITD1 43 09/09/2017   25OHVITD2 <1.0 09/09/2017   25OHVITD3 43 09/09/2017     Inflammation (CRP: Acute Phase) (ESR: Chronic Phase) Lab Results  Component Value Date   CRP 21 (H) 09/09/2017   ESRSEDRATE 50 (H) 09/09/2017   LATICACIDVEN 1.8 12/02/2019       Note: Above Lab results reviewed.  Imaging  DG Abd Portable 1V CLINICAL DATA:  Enteric  tube placement.  EXAM: PORTABLE ABDOMEN - 1 VIEW  COMPARISON:  December 26, 2018  FINDINGS: Limited assessment secondary to technique. Enteric tube appears to project over the proximal stomach at the level the GE junction. Spinal stimulator. Bibasilar heterogeneous opacities. Gaseous distension of stomach. Mild degenerative changes of the spine.  IMPRESSION: Enteric tube projects over the proximal stomach at the level of the GE junction. Recommend advancement.  Electronically Signed   By: Valentino Saxon MD   On: 12/27/2019 16:14 DG Abd Portable 1V-Small Bowel Obstruction Protocol-initial, 8 hr delay CLINICAL DATA:  Small-bowel obstruction  EXAM: PORTABLE ABDOMEN - 1 VIEW  COMPARISON:  CT 12/26/2019  FINDINGS: Multiple gas-filled  dilated loops of small bowel are seen within the mid abdomen. There is gas noted within a nondistended colon. These findings are compatible with a CT examination performed earlier. Administered oral contrast remains largely within the stomach. No gross free intraperitoneal gas. Surgical clips and surgical anastomotic staple line noted within the pelvis. Dorsal column stimulator leads again noted.  IMPRESSION: Stable findings of a small-bowel obstruction, similar to prior CT examination. Administered oral contrast remains largely within the stomach.  Electronically Signed   By: Fidela Salisbury MD   On: 12/27/2019 02:00  Assessment  The primary encounter diagnosis was Chronic pain syndrome. Diagnoses of Chronic ankle pain (Tertiary Area of Pain) (Left), Chronic low back pain (Primary Area of Pain) (Bilateral) (L>R), Chronic lower extremity pain (Referred) (Secondary Area of Pain) (Left), CRPS (complex regional pain syndrome) type 1 of lower limb (left ankle), Pharmacologic therapy, Neurogenic pain, and Neuropathic pain were also pertinent to this visit.  Plan of Care  Problem-specific:  No problem-specific Assessment & Plan notes found for this encounter.  Marie Clarke has a current medication list which includes the following long-term medication(s): desvenlafaxine, esomeprazole, and gabapentin.  Pharmacotherapy (Medications Ordered): Meds ordered this encounter  Medications  . gabapentin (NEURONTIN) 800 MG tablet    Sig: Take 1 tablet (800 mg total) by mouth 3 (three) times daily for 15 days, THEN 1 tablet (800 mg total) 4 (four) times daily for 15 days.    Dispense:  105 tablet    Refill:  0    Fill one day early if pharmacy is closed on scheduled refill date. May substitute for generic if available.   Orders:  No orders of the defined types were placed in this encounter.  Follow-up plan:   Return in about 3 weeks (around 02/03/2020) for (VIrtual), (Med Mgmt) to evaluate  gabapentin titration.      Interventional management options:  Considering:   NOTE: The patient is medical psychology evaluation indicates that the patient is a "suitable candidate" for a spinal cord stimulator trial. Possible lumbar spinal cord stimulator implant([place] done 04/22/2019.  She is currently pending implant.)  Diagnosticbilateral lumbar facet block Possible bilateral lumbar facetRFA Diagnostic left L5 TFESI Diagnostic left L5 SNRB Possible left L5 DRG RFA Diagnostic bilateral cervical facet block Possible bilateral cervical facetRFA Diagnosticleft IA shoulder injection Diagnosticleft suprascapular NB Diagnostic left suprascapularRFA Diagnostic left lumbar sympathetic block (LSB) #1  Possible left lumbar sympathetic RFA   Palliative PRN treatment(s):   Therapeutic/palliative left L5 TFESI #2 + right L4-5 LESI #2 (100/100/50/100) Palliative bilateral lumbar facet blocks Palliative right lumbar facet RFA #2 (last done 04/22/2018) Palliative left lumbar facet RFA #2 (last done 03/13/2018)       Recent Visits Date Type Provider Dept  12/07/19 Telemedicine Milinda Pointer, [facility]  11/17/19 Telemedicine [place],  Beatriz Chancellor, [facility]  10/22/19 Procedure visit Milinda Pointer, MD [facility]  Showing recent visits within past 90 days and meeting all other requirements Today's Visits Date Type Provider Dept  01/13/20 Telemedicine Milinda Pointer, MD [facility]  Showing today's visits and meeting all other requirements Future Appointments No visits were found meeting these conditions. Showing future appointments within next 90 days and meeting all other requirements  I discussed the assessment and treatment plan with the patient. The patient was provided an opportunity to ask questions and all were answered. The patient agreed with the plan and demonstrated an understanding of the  instructions.  Patient advised to call back or seek an in-person evaluation if the symptoms or condition worsens.  Duration of encounter: 15 minutes.  Note by: Gaspar Cola, MD Date: 01/13/2020; Time: 5:11 PM

## 2020-01-18 ENCOUNTER — Encounter: Payer: Self-pay | Admitting: Psychiatry

## 2020-01-18 ENCOUNTER — Other Ambulatory Visit: Payer: Self-pay

## 2020-01-18 ENCOUNTER — Telehealth (INDEPENDENT_AMBULATORY_CARE_PROVIDER_SITE_OTHER): Payer: Medicare HMO | Admitting: Psychiatry

## 2020-01-18 DIAGNOSIS — F4310 Post-traumatic stress disorder, unspecified: Secondary | ICD-10-CM | POA: Diagnosis not present

## 2020-01-18 DIAGNOSIS — F410 Panic disorder [episodic paroxysmal anxiety] without agoraphobia: Secondary | ICD-10-CM | POA: Diagnosis not present

## 2020-01-18 DIAGNOSIS — F3178 Bipolar disorder, in full remission, most recent episode mixed: Secondary | ICD-10-CM | POA: Diagnosis not present

## 2020-01-18 DIAGNOSIS — R259 Unspecified abnormal involuntary movements: Secondary | ICD-10-CM

## 2020-01-18 MED ORDER — BENZTROPINE MESYLATE 0.5 MG PO TABS: 0.5000 mg | ORAL_TABLET | Freq: Two times a day (BID) | ORAL | 0 refills | Status: DC | PRN

## 2020-01-18 NOTE — Progress Notes (Signed)
Virtual Visit via Video Note  I connected with Marie Clarke on 01/18/20 at 10:00 AM EST by a video enabled telemedicine application and verified that I am speaking with the correct person using two identifiers.  Location Provider Location : [facility] Patient Location : Home  Participants: Patient , Provider   I discussed the limitations of evaluation and management by telemedicine and the availability of in person appointments. The patient expressed understanding and agreed to proceed.    I discussed the assessment and treatment plan with the patient. The patient was provided an opportunity to ask questions and all were answered. The patient agreed with the plan and demonstrated an understanding of the instructions.   The patient was advised to call back or seek an in-person evaluation if the symptoms worsen or if the condition fails to improve as anticipated.   [facility] MD OP Progress Note  01/18/2020 1:44 PM Marie Clarke  MRN:  [id number]  Chief Complaint:  Chief Complaint    Follow-up     HPI: Marie Clarke is a 49 year old Caucasian female, single, lives in [place], has a history of bipolar disorder, PTSD, panic attacks, recurrent ventral hernia, coronary artery disease, OSA on CPAP, COPD, hyperlipidemia, hypertension, hypothyroidism, abnormal involuntary movements was evaluated by telemedicine today.  Patient today reports she is currently making progress with regards to her mood.  Denies any significant panic attacks.  Denies any manic or depressive symptoms.  She reports she is compliant on all medications.  She has used the Ativan orally twice since her last visit with Probation officer.  She reports she does have tremors and abnormal movements however the Cogentin is beneficial.  She does have some days when the movements are worse and she wonders whether she can take an extra dosage of Cogentin those days.  Patient denies any suicidality, homicidality or perceptual disturbances.  Patient denies  any other concerns today.  Visit Diagnosis:    ICD-10-CM   1. Bipolar disorder, in full remission, most recent episode mixed ([place])  F31.78   2. PTSD (post-traumatic stress disorder)  F43.10   3. Panic attacks  F41.0   4. Abnormal involuntary movement  R25.9 benztropine (COGENTIN) 0.5 MG tablet    Past Psychiatric History: I have reviewed past psychiatric history from my progress note on 03/06/2017  Past Medical History:  Past Medical History:  Diagnosis Date  . Anemia   . Anginal pain ([place])   . Anxiety   . Arthritis   . Asthma   . Bilateral lower extremity edema   . Bipolar disorder ([place])   . CAD (coronary artery disease) unk  . CHF (congestive heart failure) ([place])   . Chronic pain syndrome   . COPD (chronic obstructive pulmonary disease) ([facility])    emphysema  . DDD (degenerative disc disease), lumbar   . Depression unk  . Diabetes mellitus without complication ([place])   . Diabetes mellitus, type II ([place])   . Drug overdose 27-May-2013   after dad died  . Dysrhythmia    history of tachy arrythmias  . Fibromyalgia   . GERD (gastroesophageal reflux disease)   . Headache    chronic migraines  . Hyperlipidemia   . Hypertension   . Hypothyroidism   . Left leg pain 04/29/2014  . MI (myocardial infarction) ([place]) 05-27-05  . Muscle ache 09/16/2014  . Osteoporosis   . Overactive bladder   . Pancreatitis unk  . Panic attacks   . PTSD (post-traumatic stress disorder)   . Reflex sympathetic dystrophy   .  Renal cyst, left   . Renal insufficiency    ckd stage iii  . Restless legs syndrome   . Sleep apnea 06/2019   uses cpap  . Sleep apnea   . Stroke ([place]) 2010   TIA x 2. no residual deficits  . Suicide attempt [facility])    after father died.  . Thyroid disease    thyroid nodule  . TIA (transient ischemic attack) unk  . TIA (transient ischemic attack)     Past Surgical History:  Procedure Laterality Date  . ABDOMINAL HYSTERECTOMY    . CARDIAC CATHETERIZATION    . HERNIA REPAIR   07/15/2017   [facility]  . lumbar facet     several  . prolapse rectum surgery N/A July 2016  . THORACIC LAMINECTOMY FOR SPINAL CORD STIMULATOR N/A 07/27/2019   Procedure: THORACIC SPINAL CORD STIMULATOR PLACEMENT WITH RIGHT FLANK PULSE GENERATOR;  Surgeon: [name], MD;  Location: [facility] ORS;  Service: Neurosurgery;  Laterality: N/A;  . TONSILLECTOMY      Family Psychiatric History: I have reviewed family psychiatric history from my progress note on 03/06/2017  Family History:  Family History  Problem Relation Age of Onset  . Diabetes Mellitus II Mother   . CAD Mother   . Sleep apnea Mother   . Osteoarthritis Mother   . Osteoporosis Mother   . Anxiety disorder Mother   . Depression Mother   . Bipolar disorder Mother   . Bipolar disorder Father   . Hypertension Father   . Depression Father   . Anxiety disorder Father   . Post-traumatic stress disorder Sister     Social History: Reviewed social history from my progress note on 03/06/2017 Social History   Socioeconomic History  . Marital status: Single    Spouse name: Not on file  . Number of children: 0  . Years of education: Not on file  . Highest education level: High school graduate  Occupational History    Comment: not employed  Tobacco Use  . Smoking status: Former Smoker    Packs/day: 0.50    Types: Cigarettes    Quit date: 09/01/2018    Years since quitting: 1.3  . Smokeless tobacco: Never Used  . Tobacco comment: Patient quit smoking 09/01/2018  Vaping Use  . Vaping Use: Never used  Substance and Sexual Activity  . Alcohol use: No    Alcohol/week: 0.0 standard drinks  . Drug use: No  . Sexual activity: Not Currently  Other Topics Concern  . Not on file  Social History Narrative   Patient lives with aunt and uncle.   Social Determinants of Health   Financial Resource Strain:   . Difficulty of Paying Living Expenses: Not on file  Food Insecurity:   . Worried About Charity fundraiser in the Last Year: Not  on file  . Ran Out of Food in the Last Year: Not on file  Transportation Needs:   . Lack of Transportation (Medical): Not on file  . Lack of Transportation (Non-Medical): Not on file  Physical Activity:   . Days of Exercise per Week: Not on file  . Minutes of Exercise per Session: Not on file  Stress:   . Feeling of Stress : Not on file  Social Connections:   . Frequency of Communication with Friends and Family: Not on file  . Frequency of Social Gatherings with Friends and Family: Not on file  . Attends Religious Services: Not on file  . Active Member of  Clubs or Organizations: Not on file  . Attends Archivist Meetings: Not on file  . Marital Status: Not on file    Allergies:  Allergies  Allergen Reactions  . Diazepam Hives and Nausea And Vomiting  . Ziprasidone Hcl Other (See Comments) and Itching    CONFUSED CONFUSED Other reaction(s): Other (See Comments) CONFUSED CONFUSED   . Azithromycin Hives  . Divalproex Sodium Hives  . Levofloxacin Hives and Rash    1/31: would like to retry since not taking valium  . Lisinopril     Medicine cause kidney failure  . Metronidazole Hives    1/31: would like to retry since she is no longer taking valium  . Sulfa Antibiotics Hives  . Valproic Acid Itching and Rash  . Cephalexin Hives  . Ciprofloxacin Hives  . Penicillins Hives    Has patient had a PCN reaction causing immediate rash, facial/tongue/throat swelling, SOB or lightheadedness with hypotension: No Has patient had a PCN reaction causing severe rash involving mucus membranes or skin necrosis: No Has patient had a PCN reaction that required hospitalization: No Has patient had a PCN reaction occurring within the last 10 years: No If all of the above answers are "NO", then may proceed with Cephalosporin use.     Metabolic Disorder Labs: Lab Results  Component Value Date   HGBA1C 6.4 (H) 12/03/2019   MPG 137 12/03/2019   MPG 134.11 09/29/2018   No results  found for: PROLACTIN No results found for: CHOL, TRIG, HDL, CHOLHDL, VLDL, LDLCALC Lab Results  Component Value Date   TSH 2.296 11/17/2018   TSH 3.335 09/29/2018    Therapeutic Level Labs: No results found for: LITHIUM Lab Results  Component Value Date   VALPROATE 33 (L) 09/29/2018   No components found for:  CBMZ  Current Medications: Current Outpatient Medications  Medication Sig Dispense Refill  . Dulaglutide 0.75 MG/0.5ML SOPN Inject into the skin.    . Albuterol Sulfate (PROAIR RESPICLICK) 863 (90 Base) MCG/ACT AEPB Inhale into the lungs.    . benztropine (COGENTIN) 0.5 MG tablet Take 1 tablet (0.5 mg total) by mouth 2 (two) times daily as needed for tremors. 180 tablet 0  . busPIRone (BUSPAR) 15 MG tablet Take 1 tablet (15 mg total) by mouth 2 (two) times daily. 180 tablet 0  . cyclobenzaprine (FLEXERIL) 10 MG tablet Take by mouth.    . desvenlafaxine (PRISTIQ) 100 MG 24 hr tablet Take 1 tablet (100 mg total) by mouth daily. 90 tablet 0  . dicyclomine (BENTYL) 10 MG capsule Take by mouth.    . esomeprazole (NEXIUM) 40 MG capsule Take by mouth.    . fluticasone (FLONASE) 50 MCG/ACT nasal spray Place into the nose.    . gabapentin (NEURONTIN) 800 MG tablet Take 1 tablet (800 mg total) by mouth 3 (three) times daily for 15 days, THEN 1 tablet (800 mg total) 4 (four) times daily for 15 days. 105 tablet 0  . glipiZIDE (GLUCOTROL XL) 10 MG 24 hr tablet Take by mouth.    Marland Kitchen ibuprofen (ADVIL) 800 MG tablet Take by mouth.    . levocetirizine (XYZAL) 5 MG tablet     . levothyroxine (SYNTHROID) 175 MCG tablet Take by mouth.    Marland Kitchen LORazepam (ATIVAN) 0.5 MG tablet Take 1 tablet (0.5 mg total) by mouth as directed. Take 1 tablet 3-4 times a week only for severe panic attacks 15 tablet 0  . ondansetron (ZOFRAN-ODT) 8 MG disintegrating tablet Take by mouth.    Marland Kitchen  potassium chloride SA (KLOR-CON) 20 MEQ tablet Take 20 mEq by mouth 2 (two) times daily.    . QUEtiapine (SEROQUEL) 100 MG tablet  Take by mouth.    . QUEtiapine (SEROQUEL) 25 MG tablet Take by mouth.    Marland Kitchen rOPINIRole (REQUIP) 1 MG tablet Take by mouth.     No current facility-administered medications for this visit.     Musculoskeletal: Strength & Muscle Tone: UTA Gait & Station: UTA Patient leans: N/A  Psychiatric Specialty Exam: Review of Systems  Psychiatric/Behavioral: Negative for agitation, behavioral problems, confusion, decreased concentration, dysphoric mood, hallucinations, self-injury, sleep disturbance and suicidal ideas. The patient is not nervous/anxious and is not hyperactive.   All other systems reviewed and are negative.   There were no vitals taken for this visit.There is no height or weight on file to calculate BMI.  General Appearance: Casual  Eye Contact:  Fair  Speech:  Clear and Coherent  Volume:  Normal  Mood:  Euthymic  Affect:  Congruent  Thought Process:  Goal Directed and Descriptions of Associations: Intact  Orientation:  Full (Time, Place, and Person)  Thought Content: Logical   Suicidal Thoughts:  No  Homicidal Thoughts:  No  Memory:  Immediate;   Fair Recent;   Fair Remote;   Fair  Judgement:  Fair  Insight:  Fair  Psychomotor Activity:  Normal  Concentration:  Concentration: Fair and Attention Span: Fair  Recall:  [employer] of Knowledge: Fair  Language: Fair  Akathisia:  No  Handed:  Right  AIMS (if indicated): UTA  Assets:  Communication Skills Desire for Improvement Housing Social Support  ADL's:  Intact  Cognition: WNL  Sleep:  Fair   Screenings: PHQ2-9     Procedure visit from 10/22/2019 in [facility] Procedure visit from 04/22/2018 in [facility] Procedure visit from 03/13/2018 in [facility] Visit from 12/23/2017 in [facility] Procedure visit from 12/05/2017 in [facility]  PHQ-2 Total Score 0 0 0 0 0       Assessment and Plan: Shanyce Daris is a 49 year old Caucasian female who has a history of bipolar disorder, panic attacks, multiple medical problems including chronic back pain was evaluated by telemedicine today.  Patient is currently making progress with regards to mood however does have abnormal involuntary movements likely due to being on psychotropic medications including Seroquel.  Discussed plan as noted below.  Plan  Bipolar disorder in remission Seroquel 125 mg p.o. nightly Seroquel 25 mg p.o. every morning BuSpar 15 mg p.o. twice daily Pristiq 100 mg p.o. nightly  PTSD-stable Continue CBT Pristiq and BuSpar as prescribed  Panic attacks-improving Pristiq 100 mg p.o. nightly Ativan 0.5 mg as needed for severe panic symptoms only.  She has been limiting use Continue CBT. She also has hydroxyzine 25 mg every 6 hours as needed for severe anxiety attacks  Abnormal involuntary movement-improving Continue Cogentin for side effects of Seroquel however discussed with patient to use it only as needed.  Advised patient to skip the Cogentin on the days that she feels she does not have any involuntary movements.  However discussed with her she could take an extra dose the days that she feels it is worse.  Follow-up in clinic in 4 weeks or sooner if needed.  I have spent atleast 20 minutes face to face by video with patient  today. More than 50 % of the time was spent for preparing to see the patient ( e.g., review of test, records ), ordering medications and test ,psychoeducation and supportive psychotherapy and care coordination,as well as documenting clinical information in electronic health record. This note was generated in part or whole with voice recognition software. Voice recognition is usually quite accurate but there are transcription errors that can and very often do occur. I apologize for any  typographical errors that were not detected and corrected.     Ursula Alert, MD 01/19/2020, 8:21 AM

## 2020-01-25 ENCOUNTER — Telehealth: Payer: Medicare HMO | Admitting: Psychiatry

## 2020-01-25 ENCOUNTER — Other Ambulatory Visit: Payer: Self-pay

## 2020-01-31 NOTE — Progress Notes (Signed)
Patient: Marie Clarke  Service Category: E/M  Provider: Gaspar Cola, MD  DOB: 1970/08/05  DOS: 02/03/2020  Location: Office  MRN: [id number]  Setting: Ambulatory outpatient  Referring Provider: Sharyne Peach, MD  Type: Established Patient  Specialty: Interventional Pain Management  PCP: Sharyne Peach, MD  Location: Remote location  Delivery: TeleHealth     Virtual Encounter - Pain Management PROVIDER NOTE: Information contained herein reflects review and annotations entered in association with encounter. Interpretation of such information and data should be left to medically-trained personnel. Information provided to patient can be located elsewhere in the medical record under "Patient Instructions". Document created using STT-dictation technology, any transcriptional errors that may result from process are unintentional.    Contact & Pharmacy Preferred: [phone] Home: [phone] (home) Mobile: [phone] (mobile) E-mail: [email or web address]  [place] [place] [facility] [place] [address] [place] [address] Phone: [phone] Fax: [phone]   Pre-screening  [name] offered "in-person" vs "virtual" encounter. She indicated preferring virtual for this encounter.   Reason COVID-19*  Social distancing based on CDC and AMA recommendations.   I contacted Marie Clarke on 02/03/2020 via telephone.      I clearly identified myself as Gaspar Cola, MD. I verified that I was speaking with the correct person using two identifiers (Name: Temperence Zenor, and date of birth: Mar 17, 1970).  Consent I sought verbal advanced consent from [facility] for virtual visit interactions. I informed [name] of possible security and privacy concerns, risks, and limitations associated with providing "not-in-person" medical evaluation and management services. I also informed [name] of the availability of "in-person" appointments. Finally, I informed her that  there would be a charge for the virtual visit and that she could be  personally, fully or partially, financially responsible for it. Ms. Pitstick expressed understanding and agreed to proceed.   Historic Elements   Ms. Annahi Short is a 49 y.o. year old, female patient evaluated today after our last contact on 11/03/2019. Ms. Rake  has a past medical history of Anemia, Anginal pain ([place]), Anxiety, Arthritis, Asthma, Bilateral lower extremity edema, Bipolar disorder ([place]), CAD (coronary artery disease) ([place]), CHF (congestive heart failure) ([place]), Chronic pain syndrome, COPD (chronic obstructive pulmonary disease) ([place]), DDD (degenerative disc disease), lumbar, Depression ([place]), Diabetes mellitus without complication ([place]), Diabetes mellitus, type II ([place]), Drug overdose (2015), Dysrhythmia, Fibromyalgia, GERD (gastroesophageal reflux disease), Headache, Hyperlipidemia, Hypertension, Hypothyroidism, Left leg pain (04/29/2014), MI (myocardial infarction) ([place]) (2007), Muscle ache (09/16/2014), Osteoporosis, Overactive bladder, Pancreatitis ([place]), Panic attacks, PTSD (post-traumatic stress disorder), Reflex sympathetic dystrophy, Renal cyst, left, Renal insufficiency, Restless legs syndrome, Sleep apnea (06/2019), Sleep apnea, Stroke ([place]) (2010), Suicide attempt [facility]), Thyroid disease, TIA (transient ischemic attack) ([place]), and TIA (transient ischemic attack). She also  has a past surgical history that includes prolapse rectum surgery (N/A, July 2016); Tonsillectomy; Abdominal hysterectomy; Hernia repair (07/15/2017); Cardiac catheterization; lumbar facet; and Thoracic laminectomy for spinal cord stimulator (N/A, 07/27/2019). Ms. Wisinski has a current medication list which includes the following prescription(s): proair respiclick, benztropine, buspirone, cyclobenzaprine, desvenlafaxine, dicyclomine, dulaglutide, fluticasone, gabapentin, glipizide, ibuprofen, levocetirizine, levothyroxine, lorazepam, ondansetron, potassium chloride  sa, quetiapine, quetiapine, ropinirole, esomeprazole, and pregabalin. She  reports that she quit smoking about 17 months ago. Her smoking use included cigarettes. She smoked 0.50 packs per day. She has never used smokeless tobacco. She reports that she does not drink alcohol and does not use drugs. Ms. Parsley is allergic to diazepam, ziprasidone hcl, azithromycin, divalproex sodium, levofloxacin, lisinopril,  metronidazole, sulfa antibiotics, valproic acid, cephalexin, ciprofloxacin, and penicillins.   HPI  Today, she is being contacted for medication management.  The patient indicates having gone all the way up to 800 mg 4 times daily in she describes having no side effects to the medicine however, she is also indicating that she is not getting any type of benefit from that medicine.  She thinks that the Lyrica was working better for her than this 1 and therefore today we will be tapering down the gabapentin and putting her back on the Lyrica.  During today's encounter she kept going over how she wants me to put her on a stronger pain medicine.  Several times she said that she wanted to go back to the tramadol however, we had already given this patient a chance and unfortunately she completely disregarded our medication rules and regulations and therefore I am not going to go there again.  Pharmacotherapy Assessment  Analgesic: No opioid analgesics prescribed by our practice.I have discontinue all opioid analgesics on 09/09/2019 secondary to the patient's disregard towards our medication rules and regulations.  High-Risk due to history of a prior suicidal attempt w/ medications.  PMP revealed obtaining undisclosed opioid analgesics from several providers. MME/day:36m/day.   Monitoring: [place] PMP: PDMP reviewed during this encounter.       Pharmacotherapy: No side-effects or adverse reactions reported. Compliance: No problems identified. Effectiveness: Clinically acceptable. Plan: Refer to "POC".  UDS:   Summary  Date Value Ref Range Status  07/02/2019 Note  Final    Comment:    ==================================================================== [employer] Select 13 (MW) ==================================================================== Specimen Alert Note: Urinary creatinine is very low; ability to detect some drugs may be compromised; creatinine-normalized drug concentrations should be interpreted with caution. Suggest recollection. (Creatinine) ==================================================================== Test                             Result       Flag       Units Drug Present and Declared for Prescription Verification   Tramadol                       >62500       EXPECTED   ng/mg creat   O-Desmethyltramadol            22813        EXPECTED   ng/mg creat   N-Desmethyltramadol            11550        EXPECTED   ng/mg creat    Source of tramadol is a prescription medication. O-desmethyltramadol    and N-desmethyltramadol are expected metabolites of tramadol. ==================================================================== Test                      Result    Flag   Units      Ref Range   Creatinine              8         LL     mg/dL      >=20 ==================================================================== Declared Medications:  The flagging and interpretation on this report are based on the  following declared medications.  Unexpected results may arise from  inaccuracies in the declared medications.  **Note: The testing scope of this panel includes these medications:  Tramadol  **Note: The testing scope of this panel does not include the  following reported medications:  Albuterol  Aspirin  Atorvastatin  Atropine (Lomotil)  Buspirone  Carvedilol (Coreg)  Celecoxib (Celebrex)  Colchicine  Cysteamine (Procysbi)  Darifenacin  Dicyclomine (Bentyl)  Diphenoxylate (Lomotil)  Dulaglutide  Eye Drops  Famotidine (Pepcid)  Fluticasone  Fluticasone  (Trelegy)  Gabapentin  Hydrochlorothiazide  Hydroxyzine  Levocetirizine (Xyzal)  Levothyroxine  Magnesium (Mag-Ox)  Methocarbamol  Metoprolol  Montelukast (Singulair)  Naproxen  Nitroglycerin  Omeprazole  Ondansetron  Potassium  Prednisone  Pregabalin  Prochlorperazine (Compazine)  Promethazine  Quetiapine (Seroquel)  Rizatriptan (Maxalt)  Ropinirole (Requip)  Solifenacin (Vesicare)  Topiramate (Topamax)  Torsemide  Triamterene  Umeclidinium (Trelegy)  Vilanterol (Trelegy)  Vitamin B  Vitamin C  Vitamin D3  Vitamin E ==================================================================== For clinical consultation, please call [phone]. ====================================================================     Laboratory Chemistry Profile   Renal Lab Results  Component Value Date   BUN 16 12/27/2019   CREATININE 0.87 12/27/2019   BCR 16 09/09/2017   GFRAA >60 10/25/2019   GFRNONAA >60 12/27/2019     Hepatic Lab Results  Component Value Date   AST 13 (L) 12/26/2019   ALT 14 12/26/2019   ALBUMIN 3.8 12/26/2019   ALKPHOS 95 12/26/2019   LIPASE 22 12/26/2019     Electrolytes Lab Results  Component Value Date   NA 142 12/27/2019   K 3.9 12/27/2019   CL 107 12/27/2019   CALCIUM 8.9 12/27/2019   MG 1.8 12/27/2019   PHOS 4.5 12/27/2019     Bone Lab Results  Component Value Date   25OHVITD1 43 09/09/2017   25OHVITD2 <1.0 09/09/2017   25OHVITD3 43 09/09/2017     Inflammation (CRP: Acute Phase) (ESR: Chronic Phase) Lab Results  Component Value Date   CRP 21 (H) 09/09/2017   ESRSEDRATE 50 (H) 09/09/2017   LATICACIDVEN 1.8 12/02/2019       Note: Above Lab results reviewed.  Imaging  DG Abd Portable 1V CLINICAL DATA:  Enteric tube placement.  EXAM: PORTABLE ABDOMEN - 1 VIEW  COMPARISON:  December 26, 2018  FINDINGS: Limited assessment secondary to technique. Enteric tube appears to project over the proximal stomach at the level the  GE junction. Spinal stimulator. Bibasilar heterogeneous opacities. Gaseous distension of stomach. Mild degenerative changes of the spine.  IMPRESSION: Enteric tube projects over the proximal stomach at the level of the GE junction. Recommend advancement.  Electronically Signed   By: Valentino Saxon MD   On: 12/27/2019 16:14 DG Abd Portable 1V-Small Bowel Obstruction Protocol-initial, 8 hr delay CLINICAL DATA:  Small-bowel obstruction  EXAM: PORTABLE ABDOMEN - 1 VIEW  COMPARISON:  CT 12/26/2019  FINDINGS: Multiple gas-filled dilated loops of small bowel are seen within the mid abdomen. There is gas noted within a nondistended colon. These findings are compatible with a CT examination performed earlier. Administered oral contrast remains largely within the stomach. No gross free intraperitoneal gas. Surgical clips and surgical anastomotic staple line noted within the pelvis. Dorsal column stimulator leads again noted.  IMPRESSION: Stable findings of a small-bowel obstruction, similar to prior CT examination. Administered oral contrast remains largely within the stomach.  Electronically Signed   By: Fidela Salisbury MD   On: 12/27/2019 02:00  Assessment  The primary encounter diagnosis was Chronic pain syndrome. Diagnoses of Chronic ankle pain (Tertiary Area of Pain) (Left), Chronic low back pain (Primary Area of Pain) (Bilateral) (L>R), Chronic lower extremity pain (Referred) (Secondary Area of Pain) (Left), Neurogenic pain, Neuropathic pain, and Pharmacologic therapy were also pertinent to this visit.  Plan of Care  Problem-specific:  No problem-specific Assessment & Plan notes found for this encounter.  Ms. Taffany Heiser has a current medication list which includes the following long-term medication(s): benztropine, desvenlafaxine, gabapentin, esomeprazole, and pregabalin.  Pharmacotherapy (Medications Ordered): Meds ordered this encounter  Medications  . pregabalin  (LYRICA) 150 MG capsule    Sig: Take 1 capsule (150 mg total) by mouth 2 (two) times daily for 15 days, THEN 1 capsule (150 mg total) 3 (three) times daily for 15 days. Do not take gabapentin (Neurontin) while taking this medication..    Dispense:  75 capsule    Refill:  0    Fill one day early if pharmacy is closed on scheduled refill date. May substitute for generic if available.   Orders:  No orders of the defined types were placed in this encounter.  Follow-up plan:   Return in about 1 month (around 03/05/2020) for (VIrtual), (Med Mgmt) to evaluate Lyrica titration.  Today we will be discontinuing the gabapentin.  The patient was instructed to go down by 1 pill every other day.  At the same time we will be starting her on the Lyrica.     Interventional management options:  Considering:   NOTE: The patient is medical psychology evaluation indicates that the patient is a "suitable candidate" for a spinal cord stimulator trial. Possible lumbar spinal cord stimulator implant(Trial done 04/22/2019.  She is currently pending implant.)  Diagnosticbilateral lumbar facet block Possible bilateral lumbar facetRFA Diagnostic left L5 TFESI Diagnostic left L5 SNRB Possible left L5 DRG RFA Diagnostic bilateral cervical facet block Possible bilateral cervical facetRFA Diagnosticleft IA shoulder injection Diagnosticleft suprascapular NB Diagnostic left suprascapularRFA Diagnostic left lumbar sympathetic block (LSB) #1  Possible left lumbar sympathetic RFA   Palliative PRN treatment(s):   Therapeutic/palliative left L5 TFESI #2 + right L4-5 LESI #2 (100/100/50/100) Palliative bilateral lumbar facet blocks Palliative right lumbar facet RFA #2 (last done 04/22/2018) Palliative left lumbar facet RFA #2 (last done 03/13/2018)      Recent Visits Date Type Provider Dept  01/13/20 Telemedicine Milinda Pointer, MD [facility]  12/07/19 Telemedicine Milinda Pointer, MD  [facility]  11/17/19 Telemedicine Milinda Pointer, MD [facility]  Showing recent visits within past 90 days and meeting all other requirements Today's Visits Date Type Provider Dept  02/03/20 Telemedicine Milinda Pointer, MD [facility]  Showing today's visits and meeting all other requirements Future Appointments No visits were found meeting these conditions. Showing future appointments within next 90 days and meeting all other requirements  I discussed the assessment and treatment plan with the patient. The patient was provided an opportunity to ask questions and all were answered. The patient agreed with the plan and demonstrated an understanding of the instructions.  Patient advised to call back or seek an in-person evaluation if the symptoms or condition worsens.  Duration of encounter: 15 minutes.  Note by: Gaspar Cola, MD Date: 02/03/2020; Time: 1:09 PM

## 2020-02-03 ENCOUNTER — Other Ambulatory Visit: Payer: Self-pay

## 2020-02-03 ENCOUNTER — Ambulatory Visit: Payer: Medicare HMO | Attending: Pain Medicine | Admitting: Pain Medicine

## 2020-02-03 DIAGNOSIS — M25572 Pain in left ankle and joints of left foot: Secondary | ICD-10-CM | POA: Diagnosis not present

## 2020-02-03 DIAGNOSIS — G8929 Other chronic pain: Secondary | ICD-10-CM

## 2020-02-03 DIAGNOSIS — M545 Low back pain, unspecified: Secondary | ICD-10-CM | POA: Diagnosis not present

## 2020-02-03 DIAGNOSIS — G894 Chronic pain syndrome: Secondary | ICD-10-CM

## 2020-02-03 DIAGNOSIS — M79605 Pain in left leg: Secondary | ICD-10-CM | POA: Diagnosis not present

## 2020-02-03 DIAGNOSIS — Z79899 Other long term (current) drug therapy: Secondary | ICD-10-CM

## 2020-02-03 DIAGNOSIS — M792 Neuralgia and neuritis, unspecified: Secondary | ICD-10-CM

## 2020-02-03 MED ORDER — PREGABALIN 150 MG PO CAPS: ORAL_CAPSULE | ORAL | 0 refills | Status: DC

## 2020-02-03 NOTE — Patient Instructions (Signed)
Pregabalin capsules What is this medicine? PREGABALIN (pre GAB a lin) is used to treat nerve pain from diabetes, shingles, spinal cord injury, and fibromyalgia. It is also used to control seizures in epilepsy. This medicine may be used for other purposes; ask your health care provider or pharmacist if you have questions. COMMON BRAND NAME(S): Lyrica What should I tell my health care provider before I take this medicine? They need to know if you have any of these conditions:  heart disease  history of drug abuse or alcohol abuse problem  kidney disease  lung or breathing disease  suicidal thoughts, plans, or attempt; a previous suicide attempt by you or a family member  an unusual or allergic reaction to pregabalin, gabapentin, other medicines, foods, dyes, or preservatives  pregnant or trying to get pregnant  breast-feeding How should I use this medicine? Take this medicine by mouth with a glass of water. Follow the directions on the prescription label. You can take it with or without food. If it upsets your stomach, take it with food. Take your medicine at regular intervals. Do not take it more often than directed. Do not stop taking except on your doctor's advice. A special MedGuide will be given to you by the pharmacist with each prescription and refill. Be sure to read this information carefully each time. Talk to your pediatrician regarding the use of this medicine in children. While this drug may be prescribed for children as young as 1 month for selected conditions, precautions do apply. Overdosage: If you think you have taken too much of this medicine contact a poison control center or emergency room at once. NOTE: This medicine is only for you. Do not share this medicine with others. What if I miss a dose? If you miss a dose, take it as soon as you can. If it is almost time for your next dose, take only that dose. Do not take double or extra doses. What may interact with this  medicine? This medicine may interact with the following medications:  alcohol  antihistamines for allergy, cough, and cold  certain medicines for anxiety or sleep  certain medicines for depression like amitriptyline, fluoxetine, sertraline  certain medicines for diabetes  certain medicines for seizures like phenobarbital, primidone  general anesthetics like halothane, isoflurane, methoxyflurane, propofol  local anesthetics like lidocaine, pramoxine, tetracaine  medicines that relax muscles for surgery  narcotic medicines for pain  phenothiazines like chlorpromazine, mesoridazine, prochlorperazine, thioridazine This list may not describe all possible interactions. Give your health care provider a list of all the medicines, herbs, non-prescription drugs, or dietary supplements you use. Also tell them if you smoke, drink alcohol, or use illegal drugs. Some items may interact with your medicine. What should I watch for while using this medicine? Tell your doctor or healthcare professional if your symptoms do not start to get better or if they get worse. Visit your doctor or health care professional for regular checks on your progress. Do not stop taking except on your doctor's advice. You may develop a severe reaction. Your doctor will tell you how much medicine to take. Wear a medical identification bracelet or chain if you are taking this medicine for seizures, and carry a card that describes your disease and details of your medicine and dosage times. You may get drowsy or dizzy. Do not drive, use machinery, or do anything that needs mental alertness until you know how this medicine affects you. Do not stand or sit up quickly, especially if   you are an older patient. This reduces the risk of dizzy or fainting spells. Alcohol may interfere with the effect of this medicine. Avoid alcoholic drinks. If you have a heart condition, like congestive heart failure, and notice that you are retaining  water and have swelling in your hands or feet, contact your health care provider immediately. The use of this medicine may increase the chance of suicidal thoughts or actions. Pay special attention to how you are responding while on this medicine. Any worsening of mood, or thoughts of suicide or dying should be reported to your health care professional right away. This medicine has caused reduced sperm counts in some men. This may interfere with the ability to father a child. You should talk to your doctor or health care professional if you are concerned about your fertility. Women who become pregnant while using this medicine for seizures may enroll in the [place] American Antiepileptic Drug Pregnancy Registry by calling 1-888-233-2334. This registry collects information about the safety of antiepileptic drug use during pregnancy. What side effects may I notice from receiving this medicine? Side effects that you should report to your doctor or health care professional as soon as possible:  allergic reactions like skin rash, itching or hives, swelling of the face, lips, or tongue  breathing problems  changes in vision  chest pain  confusion  jerking or unusual movements of any part of your body  loss of memory  muscle pain, tenderness, or weakness  suicidal thoughts or other mood changes  swelling of the ankles, feet, hands  unusual bruising or bleeding Side effects that usually do not require medical attention (report to your doctor or health care professional if they continue or are bothersome):  dizziness  drowsiness  dry mouth  headache  nausea  tremors  trouble sleeping  weight gain This list may not describe all possible side effects. Call your doctor for medical advice about side effects. You may report side effects to FDA at 1-800-FDA-1088. Where should I keep my medicine? Keep out of the reach of children. This medicine can be abused. Keep your medicine in a safe  place to protect it from theft. Do not share this medicine with anyone. Selling or giving away this medicine is dangerous and against the law. This medicine may cause accidental overdose and death if it taken by other adults, children, or pets. Mix any unused medicine with a substance like cat litter or coffee grounds. Then throw the medicine away in a sealed container like a sealed bag or a coffee can with a lid. Do not use the medicine after the expiration date. Store at room temperature between 15 and 30 degrees C (59 and 86 degrees F). NOTE: This sheet is a summary. It may not cover all possible information. If you have questions about this medicine, talk to your doctor, pharmacist, or health care provider.  2020 Elsevier/Gold Standard (2018-01-31 13:15:55)  

## 2020-02-04 ENCOUNTER — Encounter: Payer: Self-pay | Admitting: Pain Medicine

## 2020-02-05 ENCOUNTER — Encounter: Payer: Self-pay | Admitting: Pain Medicine

## 2020-02-10 ENCOUNTER — Other Ambulatory Visit: Payer: Self-pay | Admitting: Pain Medicine

## 2020-02-10 ENCOUNTER — Telehealth: Payer: Self-pay | Admitting: *Deleted

## 2020-02-10 DIAGNOSIS — M792 Neuralgia and neuritis, unspecified: Secondary | ICD-10-CM

## 2020-02-10 DIAGNOSIS — M47816 Spondylosis without myelopathy or radiculopathy, lumbar region: Secondary | ICD-10-CM

## 2020-02-10 DIAGNOSIS — M542 Cervicalgia: Secondary | ICD-10-CM

## 2020-02-10 DIAGNOSIS — M545 Low back pain, unspecified: Secondary | ICD-10-CM

## 2020-02-10 DIAGNOSIS — M5137 Other intervertebral disc degeneration, lumbosacral region: Secondary | ICD-10-CM

## 2020-02-10 DIAGNOSIS — M25511 Pain in right shoulder: Secondary | ICD-10-CM

## 2020-02-10 DIAGNOSIS — M25572 Pain in left ankle and joints of left foot: Secondary | ICD-10-CM

## 2020-02-10 DIAGNOSIS — G8929 Other chronic pain: Secondary | ICD-10-CM

## 2020-02-10 DIAGNOSIS — G894 Chronic pain syndrome: Secondary | ICD-10-CM

## 2020-02-10 MED ORDER — PREGABALIN 150 MG PO CAPS: 150.0000 mg | ORAL_CAPSULE | Freq: Three times a day (TID) | ORAL | 0 refills | Status: DC

## 2020-02-10 NOTE — Progress Notes (Signed)
We have received multiple calls from this patient where she is insisting that she wants to be put on tramadol. This topic has been covert with the patient in multiple occasions where her I have explained to the patient the reason why I am not going to be writing for any opioid analgesics now or in the foreseen future. I have been extremely clear to the patient about this, but she continues to go back to the same topic time after time. She again has sent messages and the nursing staff has offered to put her back on the schedule with me to again go over the same issue. This last time she has indicated that she is not interested in having a virtual visit since she refers that I am hanging in the phone on her. This is not really the case but the issue is that when she calls she keeps insisting on the same thing over and over despite the fact that I had already provided her with the answers to her question. It is as if she will not accept anything else other than me doing what she wants me to do. This phone calls can be extremely long and honestly I have attempted to shorten them as much as possible by simply providing her the answer to her question and explaining to her that no matter how many times she asks the same question, the answer will remain the same... I will not be writing opioids for her. Today we have taken a different approach. We have agreed to disagree and due to the fact that she is not happy with our service, we have offered to refer her somewhere else. She has taken [facility] offer and she wants to be referred to a different pain management facility. Today we will be entering a referral order for that.

## 2020-02-10 NOTE — Telephone Encounter (Signed)
Per Dr. Waynetta Sandy request I called patient and had a discussion with her about her latest e-mails to [place] regarding the physician "hanging up on her" during her last  tele visit.. I informed her that Dr. Laban Emperor has offered to refer her to another pain physician if she prefers. I also informed her, as  Dr. Laban Emperor has discussed with her numerous times, he will not be prescribing ANY narcotic medications for her. She has agreed to be referred to another pain physician. Our office will set that up and refer her as she wishes.

## 2020-02-22 ENCOUNTER — Other Ambulatory Visit: Payer: Self-pay | Admitting: Pain Medicine

## 2020-02-22 DIAGNOSIS — M792 Neuralgia and neuritis, unspecified: Secondary | ICD-10-CM

## 2020-02-22 DIAGNOSIS — G894 Chronic pain syndrome: Secondary | ICD-10-CM

## 2020-02-25 ENCOUNTER — Telehealth: Payer: Self-pay | Admitting: Psychiatry

## 2020-02-25 ENCOUNTER — Other Ambulatory Visit: Payer: Self-pay

## 2020-02-25 DIAGNOSIS — F3178 Bipolar disorder, in full remission, most recent episode mixed: Secondary | ICD-10-CM

## 2020-02-27 ENCOUNTER — Other Ambulatory Visit: Payer: Self-pay | Admitting: Psychiatry

## 2020-03-01 ENCOUNTER — Encounter: Payer: Self-pay | Admitting: Pain Medicine

## 2020-03-03 ENCOUNTER — Telehealth: Payer: Medicare HMO | Admitting: Pain Medicine

## 2020-03-15 HISTORY — PX: MELANOMA EXCISION: SHX5266

## 2020-03-19 DIAGNOSIS — E278 Other specified disorders of adrenal gland: Secondary | ICD-10-CM | POA: Insufficient documentation

## 2020-03-26 ENCOUNTER — Other Ambulatory Visit: Payer: Self-pay

## 2020-03-26 ENCOUNTER — Emergency Department (HOSPITAL_COMMUNITY)
Admission: EM | Admit: 2020-03-26 | Discharge: 2020-03-26 | Disposition: A | Discharge status: Home / Self Care | Payer: Medicare Other | Attending: Emergency Medicine | Admitting: Emergency Medicine

## 2020-03-26 ENCOUNTER — Encounter (HOSPITAL_COMMUNITY): Payer: Self-pay | Admitting: Emergency Medicine

## 2020-03-26 ENCOUNTER — Emergency Department (HOSPITAL_COMMUNITY): Payer: Medicare Other

## 2020-03-26 DIAGNOSIS — Z79899 Other long term (current) drug therapy: Secondary | ICD-10-CM | POA: Diagnosis not present

## 2020-03-26 DIAGNOSIS — R630 Anorexia: Secondary | ICD-10-CM | POA: Insufficient documentation

## 2020-03-26 DIAGNOSIS — E039 Hypothyroidism, unspecified: Secondary | ICD-10-CM | POA: Insufficient documentation

## 2020-03-26 DIAGNOSIS — I130 Hypertensive heart and chronic kidney disease with heart failure and stage 1 through stage 4 chronic kidney disease, or unspecified chronic kidney disease: Secondary | ICD-10-CM | POA: Diagnosis not present

## 2020-03-26 DIAGNOSIS — I2510 Atherosclerotic heart disease of native coronary artery without angina pectoris: Secondary | ICD-10-CM | POA: Insufficient documentation

## 2020-03-26 DIAGNOSIS — R1031 Right lower quadrant pain: Secondary | ICD-10-CM | POA: Diagnosis not present

## 2020-03-26 DIAGNOSIS — I509 Heart failure, unspecified: Secondary | ICD-10-CM | POA: Insufficient documentation

## 2020-03-26 DIAGNOSIS — Z20822 Contact with and (suspected) exposure to covid-19: Secondary | ICD-10-CM | POA: Diagnosis not present

## 2020-03-26 DIAGNOSIS — Z87891 Personal history of nicotine dependence: Secondary | ICD-10-CM | POA: Diagnosis not present

## 2020-03-26 DIAGNOSIS — Z7984 Long term (current) use of oral hypoglycemic drugs: Secondary | ICD-10-CM | POA: Diagnosis not present

## 2020-03-26 DIAGNOSIS — J45909 Unspecified asthma, uncomplicated: Secondary | ICD-10-CM | POA: Diagnosis not present

## 2020-03-26 DIAGNOSIS — J449 Chronic obstructive pulmonary disease, unspecified: Secondary | ICD-10-CM | POA: Diagnosis not present

## 2020-03-26 DIAGNOSIS — N183 Chronic kidney disease, stage 3 unspecified: Secondary | ICD-10-CM | POA: Diagnosis not present

## 2020-03-26 DIAGNOSIS — R112 Nausea with vomiting, unspecified: Secondary | ICD-10-CM | POA: Diagnosis not present

## 2020-03-26 DIAGNOSIS — R000 Tachycardia, unspecified: Secondary | ICD-10-CM | POA: Diagnosis not present

## 2020-03-26 DIAGNOSIS — K5900 Constipation, unspecified: Secondary | ICD-10-CM | POA: Diagnosis not present

## 2020-03-26 DIAGNOSIS — E1122 Type 2 diabetes mellitus with diabetic chronic kidney disease: Secondary | ICD-10-CM | POA: Insufficient documentation

## 2020-03-26 LAB — URINALYSIS, ROUTINE W REFLEX MICROSCOPIC
Bacteria, UA: NONE SEEN
Bilirubin Urine: NEGATIVE
Glucose, UA: NEGATIVE mg/dL
Hgb urine dipstick: NEGATIVE
Ketones, ur: NEGATIVE mg/dL
Nitrite: NEGATIVE
Protein, ur: NEGATIVE mg/dL
Specific Gravity, Urine: 1.026 (ref 1.005–1.030)
pH: 5 (ref 5.0–8.0)

## 2020-03-26 LAB — CBC
HCT: 38.1 % (ref 36.0–46.0)
Hemoglobin: 11.9 g/dL — ABNORMAL LOW (ref 12.0–15.0)
MCH: 28.4 pg (ref 26.0–34.0)
MCHC: 31.2 g/dL (ref 30.0–36.0)
MCV: 90.9 fL (ref 80.0–100.0)
Platelets: 248 10*3/uL (ref 150–400)
RBC: 4.19 MIL/uL (ref 3.87–5.11)
RDW: 14.7 % (ref 11.5–15.5)
WBC: 6.4 10*3/uL (ref 4.0–10.5)
nRBC: 0 % (ref 0.0–0.2)

## 2020-03-26 LAB — COMPREHENSIVE METABOLIC PANEL
ALT: 20 U/L (ref 0–44)
AST: 25 U/L (ref 15–41)
Albumin: 3.8 g/dL (ref 3.5–5.0)
Alkaline Phosphatase: 125 U/L (ref 38–126)
Anion gap: 14 (ref 5–15)
BUN: 26 mg/dL — ABNORMAL HIGH (ref 6–20)
CO2: 24 mmol/L (ref 22–32)
Calcium: 9.1 mg/dL (ref 8.9–10.3)
Chloride: 102 mmol/L (ref 98–111)
Creatinine, Ser: 1.22 mg/dL — ABNORMAL HIGH (ref 0.44–1.00)
GFR, Estimated: 55 mL/min — ABNORMAL LOW (ref >60)
Glucose, Bld: 105 mg/dL — ABNORMAL HIGH (ref 70–99)
Potassium: 3.9 mmol/L (ref 3.5–5.1)
Sodium: 140 mmol/L (ref 135–145)
Total Bilirubin: 0.6 mg/dL (ref 0.3–1.2)
Total Protein: 7.2 g/dL (ref 6.5–8.1)

## 2020-03-26 LAB — RESP PANEL BY RT-PCR (FLU A&B, COVID) ARPGX2
Influenza A by PCR: NEGATIVE
Influenza B by PCR: NEGATIVE
SARS Coronavirus 2 by RT PCR: NEGATIVE

## 2020-03-26 LAB — LIPASE, BLOOD: Lipase: 36 U/L (ref 11–51)

## 2020-03-26 LAB — CBG MONITORING, ED: Glucose-Capillary: 96 mg/dL (ref 70–99)

## 2020-03-26 MED ORDER — LACTATED RINGERS IV BOLUS
1000.0000 mL | Freq: Once | INTRAVENOUS | Status: AC
  Administered 2020-03-26: 1000 mL via INTRAVENOUS

## 2020-03-26 MED ORDER — MORPHINE SULFATE (PF) 4 MG/ML IV SOLN
4.0000 mg | Freq: Once | INTRAVENOUS | Status: AC
  Administered 2020-03-26: 4 mg via INTRAVENOUS
  Filled 2020-03-26: qty 1

## 2020-03-26 MED ORDER — IOHEXOL 300 MG/ML  SOLN
100.0000 mL | Freq: Once | INTRAMUSCULAR | Status: AC | PRN
  Administered 2020-03-26: 100 mL via INTRAVENOUS

## 2020-03-26 MED ORDER — POLYETHYLENE GLYCOL 3350 17 G PO PACK: 17.0000 g | PACK | Freq: Every day | ORAL | 0 refills | Status: AC

## 2020-03-26 MED ORDER — ONDANSETRON HCL 4 MG/2ML IJ SOLN
4.0000 mg | Freq: Once | INTRAMUSCULAR | Status: AC
  Administered 2020-03-26: 4 mg via INTRAVENOUS
  Filled 2020-03-26: qty 2

## 2020-03-26 NOTE — ED Provider Notes (Signed)
[facility] EMERGENCY DEPARTMENT Provider Note   CSN: [id number] Arrival date & time: 03/26/20  1332     History Chief Complaint  Patient presents with  . Abdominal Pain    Marie Clarke is a 50 y.o. female with a h/o CHF, HTN, HLD, hypothyroidism, COPD,  CAD, T2DM, previous TIA, previous SBO, anxiety, depression, Bipolar disorder, and PTSD who presents to the ED for rectal prolapse and RLQ pain. Patient states she has been having intermittent rectal prolapse for the last several months. Last prolapse earlier today and still feels like it prolapsed. Also developed sharp RLQ pain 3 days ago that began while she was vacuuming at home. Pain non-radiating. Worse with movement. She has not tried anything for pain. Associated nausea and NBNB vomiting. Difficulty tolerating PO intake at home. Denies fever, chills, SOB, chest pain, diarrhea, hematochezia, melena, or urinary symptoms.  The history is provided by the patient and medical records.  Abdominal Pain Pain location:  RLQ Pain quality: sharp   Pain radiates to:  Does not radiate Pain severity:  Moderate Onset quality:  Sudden Duration:  3 days Timing:  Constant Progression:  Unchanged Chronicity:  New Context comment:  While vacuuming Relieved by:  None tried Worsened by:  Movement Ineffective treatments:  Bowel activity and vomiting Associated symptoms: anorexia, constipation, nausea and vomiting   Associated symptoms: no chest pain, no chills, no cough, no diarrhea, no dysuria, no fever, no hematemesis, no hematochezia, no hematuria, no melena, no shortness of breath and no sore throat   Risk factors: multiple surgeries and obesity        Past Medical History:  Diagnosis Date  . Anemia   . Anginal pain ([place])   . Anxiety   . Arthritis   . Asthma   . Bilateral lower extremity edema   . Bipolar disorder ([place])   . CAD (coronary artery disease) unk  . CHF (congestive heart failure) ([place])   . Chronic pain  syndrome   . COPD (chronic obstructive pulmonary disease) (H[facility])    emphysema  . DDD (degenerative disc disease), lumbar   . Depression unk  . Diabetes mellitus without complication ([place])   . Diabetes mellitus, type II ([place])   . Drug overdose May 30, 2013   after dad died  . Dysrhythmia    history of tachy arrythmias  . Fibromyalgia   . GERD (gastroesophageal reflux disease)   . Headache    chronic migraines  . Hyperlipidemia   . Hypertension   . Hypothyroidism   . Left leg pain 04/29/2014  . MI (myocardial infarction) ([place]) 05-30-2005  . Muscle ache 09/16/2014  . Osteoporosis   . Overactive bladder   . Pancreatitis unk  . Panic attacks   . PTSD (post-traumatic stress disorder)   . Reflex sympathetic dystrophy   . Renal cyst, left   . Renal insufficiency    ckd stage iii  . Restless legs syndrome   . Sleep apnea 06/2019   uses cpap  . Sleep apnea   . Stroke ([place]) 30-May-2008   TIA x 2. no residual deficits  . Suicide attempt [facility])    after father died.  . Thyroid disease    thyroid nodule  . TIA (transient ischemic attack) unk  . TIA (transient ischemic attack)     Patient Active Problem List   Diagnosis Date Noted  . Hypokalemia   . SBO (small bowel obstruction) ([place]) 12/02/2019  . Chronic pain 12/02/2019  . Presence of neurostimulator (SCS) (June  2021) 10/06/2019  . Pain due to any device, implant or graft 09/21/2019  . Anxiety 09/10/2019  . Involuntary movements 09/10/2019  . Abnormal involuntary movement 08/18/2019  . Bipolar disorder, in full remission, most recent episode mixed ([place]) 08/11/2019  . Lumbosacral radiculopathy at L5 (Right) 04/09/2019  . Chronic migraine 03/12/2019  . Gout 01/21/2019  . Noncompliance with treatment regimen 12/29/2018  . BMI 40.0-44.9, adult ([place]) 11/19/2018  . AKI (acute kidney injury) ([place]) 11/17/2018  . Postural dizziness with presyncope 09/29/2018  . Bipolar I disorder, most recent episode mixed ([place]) 09/10/2018  . Panic attacks 09/10/2018   . Chronic lumbosacral L5-S1 IVD protrusion (Bilateral) 08/25/2018  . Lumbar lateral recess stenosis (L5-S1) (Bilateral) 08/25/2018  . Wound disruption 08/05/2018  . Osteoarthritis involving multiple joints 07/10/2018  . Long term current use of non-steroidal anti-inflammatories (NSAID) 07/10/2018  . NSAID induced gastritis 07/10/2018  . DDD (degenerative disc disease), lumbosacral 04/22/2018  . Disease related peripheral neuropathy 11/13/2017  . Gastroesophageal reflux disease without esophagitis 11/13/2017  . Occipital headache (Bilateral) 11/13/2017  . Cervicogenic headache (Bilateral) (L>R) 11/13/2017  . History of postoperative nausea 10/22/2017  . Chronic shoulder pain (5th area of Pain) (Bilateral) (L>R) 10/09/2017  . Ankle joint instability (Left) 10/09/2017  . Ankle sprain, sequela (Left) 10/09/2017  . History of psychiatric symptoms 10/09/2017  . Chronic pain syndrome 10/02/2017  . Spondylosis without myelopathy or radiculopathy, lumbosacral region 10/02/2017  . Chronic musculoskeletal pain 10/02/2017  . Elevated C-reactive protein (CRP) 09/10/2017  . Elevated sed rate 09/10/2017  . Chronic neck pain (4th area of Pain) (Bilateral) (L>R) 09/09/2017  . Pharmacologic therapy 09/09/2017  . Disorder of skeletal system 09/09/2017  . Problems influencing health status 09/09/2017  . Long term current use of opiate analgesic 09/09/2017  . Tobacco use disorder 07/16/2017  . Ventral hernia without obstruction or gangrene 06/25/2017  . Strain of extensor muscle, fascia and tendon of left index finger at wrist and hand level, initial encounter 05/16/2017  . Sepsis ([place]) 04/14/2017  . Osteopenia 04/03/2017  . Hypotension 09/17/2016  . Contusion of knee (Left) 10/12/2015  . Strain of knee (Left) 10/12/2015  . Incidental lung nodule 04/28/2015  . Chronic low back pain (1ry area of Pain) (Bilateral) (L>R) 03/28/2015  . Chronic lower extremity pain (Referred) (2ry area of Pain) (Left)  03/28/2015  . Abdominal wound dehiscence 03/28/2015  . Encounter for pain management planning 03/28/2015  . Morbid obesity ([place]) 03/28/2015  . Abnormal CT scan, lumbar spine 03/28/2015  . Lumbar facet hypertrophy 03/28/2015  . Lumbar facet syndrome (Bilateral) (L>R) 03/28/2015  . Lumbar foraminal stenosis (Bilateral) (L5-S1) 03/28/2015  . Chronic ankle pain (3ry area of Pain) (Left) 03/28/2015  . Neurogenic pain 03/28/2015  . Neuropathic pain 03/28/2015  . Myofascial pain 03/28/2015  . History of suicide attempt 03/28/2015  . PTSD (post-traumatic stress disorder) 01/13/2015  . Abnormal gait 12/15/2014  . Congestive heart failure ([place]) 11/15/2014  . Abdominal wall abscess 09/20/2014  . 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 Diabetes mellitus, type 2 ([place]) 08/13/2014  . Bipolar affective disorder ([place]) 08/13/2014  . Type 2 diabetes mellitus ([place]) 08/13/2014  . Rectal prolapse 08/09/2014  . Rectal bleeding 08/09/2014  . Rectal bleed 08/09/2014  . Affective bipolar disorder ([place]) 08/05/2014  . Arteriosclerosis of coronary artery 08/05/2014  . CCF (congestive cardiac failure) ([place]) 08/05/2014  . Chronic kidney disease 08/05/2014  . 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 Apnea, sleep 08/05/2014  . Temporary cerebral vascular dysfunction 08/05/2014  . Polypharmacy 04/29/2014  .  Other long term (current) drug therapy 04/29/2014  . 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 Chronic kidney disease, stage III (moderate) ([place]) 12/14/2013  . Controlled diabetes mellitus type II without complication ([place]) 70/62/3762  . Essential (primary) hypertension 12/03/2013  . Adult hypothyroidism 12/03/2013  . Controlled type 2 diabetes mellitus without complication ([place]) 83/15/1761    Past Surgical History:  Procedure Laterality Date  . ABDOMINAL HYSTERECTOMY    . CARDIAC CATHETERIZATION    . HERNIA REPAIR  07/15/2017   [facility]  . lumbar facet     several  . prolapse rectum surgery N/A July 2016  .  THORACIC LAMINECTOMY FOR SPINAL CORD STIMULATOR N/A 07/27/2019   Procedure: THORACIC SPINAL CORD STIMULATOR PLACEMENT WITH RIGHT FLANK PULSE GENERATOR;  Surgeon: [name], MD;  Location: [facility] ORS;  Service: Neurosurgery;  Laterality: N/A;  . TONSILLECTOMY       OB History    Gravida  0   Para  0   Term  0   Preterm  0   AB  0   Living  0     SAB  0   IAB  0   Ectopic  0   Multiple  0   Live Births  0        Obstetric Comments  Menstrual age: 16  Age 1st Pregnancy:         Family History  Problem Relation Age of Onset  . Diabetes Mellitus II Mother   . CAD Mother   . Sleep apnea Mother   . Osteoarthritis Mother   . Osteoporosis Mother   . Anxiety disorder Mother   . Depression Mother   . Bipolar disorder Mother   . Bipolar disorder Father   . Hypertension Father   . Depression Father   . Anxiety disorder Father   . Post-traumatic stress disorder Sister     Social History   Tobacco Use  . Smoking status: Former Smoker    Packs/day: 0.50    Types: Cigarettes    Quit date: 09/01/2018    Years since quitting: 1.5  . Smokeless tobacco: Never Used  . Tobacco comment: Patient quit smoking 09/01/2018  Vaping Use  . Vaping Use: Never used  Substance Use Topics  . Alcohol use: No    Alcohol/week: 0.0 standard drinks  . Drug use: No    Home Medications Prior to Admission medications   Medication Sig Start Date End Date Taking? Authorizing Provider  polyethylene glycol (MIRALAX) 17 g packet Take 17 g by mouth daily for 14 days. 03/26/20 04/09/20 Yes Christy Gentles, MD  Albuterol Sulfate (PROAIR RESPICLICK) 607 (90 Base) MCG/ACT AEPB Inhale into the lungs. 10/26/19   [provider]  benztropine (COGENTIN) 0.5 MG tablet Take 1 tablet (0.5 mg total) by mouth 2 (two) times daily as needed for tremors. 01/18/20   Ursula Alert, MD  busPIRone (BUSPAR) 15 MG tablet Take 1 tablet (15 mg total) by mouth 2 (two) times daily. 12/28/19   Ursula Alert,  MD  cyclobenzaprine (FLEXERIL) 10 MG tablet Take by mouth. 12/17/19   [provider]  desvenlafaxine (PRISTIQ) 100 MG 24 hr tablet Take 1 tablet (100 mg total) by mouth daily. 01/01/20   Ursula Alert, MD  dicyclomine (BENTYL) 10 MG capsule Take by mouth. 10/16/19   [provider]  esomeprazole (NEXIUM) 40 MG capsule Take by mouth. 12/23/19 01/22/20  [provider]  fluticasone (FLONASE) 50 MCG/ACT nasal spray Place into the nose. 06/03/19   [provider]  gabapentin (NEURONTIN) 800 MG tablet Take 1 tablet (800 mg total) by mouth 3 (three) times daily for 15 days, THEN 1 tablet (800 mg total) 4 (four) times daily for 15 days. 01/13/20 02/12/20  Milinda Pointer, MD  glipiZIDE (GLUCOTROL XL) 10 MG 24 hr tablet Take by mouth. 11/17/19 11/16/20  [provider]  ibuprofen (ADVIL) 800 MG tablet Take by mouth. 11/04/19   [provider]  levocetirizine (XYZAL) 5 MG tablet  01/08/20   [provider]  levothyroxine (SYNTHROID) 175 MCG tablet Take by mouth. 11/10/19 11/09/20  [provider]  LORazepam (ATIVAN) 0.5 MG tablet Take 1 tablet (0.5 mg total) by mouth as directed. Take 1 tablet 3-4 times a week only for severe panic attacks 01/01/20   Ursula Alert, MD  ondansetron (ZOFRAN-ODT) 8 MG disintegrating tablet Take by mouth. 12/29/19   [provider]  potassium chloride SA (KLOR-CON) 20 MEQ tablet Take 20 mEq by mouth 2 (two) times daily. 12/25/19   [provider]  pregabalin (LYRICA) 150 MG capsule Take 1 capsule (150 mg total) by mouth 3 (three) times daily. Do not take gabapentin (Neurontin) while taking this medication. 03/04/20 06/02/20  Milinda Pointer, MD  QUEtiapine (SEROQUEL) 100 MG tablet Take by mouth. 11/25/19   [provider]  QUEtiapine (SEROQUEL) 25 MG tablet Take by mouth.    [provider]  rOPINIRole (REQUIP) 1 MG tablet Take by mouth. 12/10/19   [provider]     Allergies    Diazepam, Ziprasidone hcl, Azithromycin, Divalproex sodium, Levofloxacin, Lisinopril, Metronidazole, Sulfa antibiotics, Valproic acid, Cephalexin, Ciprofloxacin, and Penicillins  Review of Systems   Review of Systems  Constitutional: Negative for chills and fever.  HENT: Negative for ear pain and sore throat.   Eyes: Negative for pain and visual disturbance.  Respiratory: Negative for cough and shortness of breath.   Cardiovascular: Negative for chest pain and palpitations.  Gastrointestinal: Positive for abdominal pain, anorexia, constipation, nausea, rectal pain and vomiting. Negative for abdominal distention, blood in stool, diarrhea, hematemesis, hematochezia and melena.  Genitourinary: Negative for dysuria and hematuria.  Musculoskeletal: Negative for arthralgias and back pain.  Skin: Negative for color change and rash.  Neurological: Negative for seizures and syncope.  All other systems reviewed and are negative.   Physical Exam Updated Vital Signs BP 90/77   Pulse 96   Temp 99 F (37.2 C) (Oral)   Resp 15   Ht 5\' 4"  (1.626 m)   Wt 102 kg   LMP  (LMP Unknown)   SpO2 93%   BMI 38.60 kg/m   Physical Exam Vitals and nursing note reviewed. Exam conducted with a chaperone present.  Constitutional:      General: She is not in acute distress.    Appearance: She is well-developed and well-nourished. She is obese. She is not ill-appearing.     Comments: Appears older than stated age  HENT:     Head: Normocephalic and atraumatic.     Right Ear: External ear normal.     Left Ear: External ear normal.     Nose: Nose normal.  Eyes:     General: No scleral icterus.       Right eye: No discharge.        Left eye: No discharge.     Conjunctiva/sclera: Conjunctivae normal.  Cardiovascular:     Rate and Rhythm: Regular rhythm. Tachycardia present.     Heart sounds: No murmur heard.   Pulmonary:  Effort: Pulmonary effort is normal. No respiratory  distress.     Breath sounds: Normal breath sounds.  Abdominal:     General: Abdomen is flat. There is no distension.     Palpations: Abdomen is soft.     Tenderness: There is abdominal tenderness in the right lower quadrant. There is no guarding or rebound. Negative signs include Murphy's sign, Rovsing's sign and McBurney's sign.     Hernia: No hernia is present.     Comments: Old midline surgical scar  Genitourinary:    Rectum: Normal. No anal fissure.     Comments: No rectal prolapse or gross melena or blood. Musculoskeletal:        General: No edema.     Cervical back: Neck supple.     Right lower leg: No edema.     Left lower leg: No edema.  Skin:    General: Skin is warm and dry.     Findings: No rash.  Neurological:     General: No focal deficit present.     Mental Status: She is alert and oriented to person, place, and time.     Sensory: No sensory deficit.     Motor: No weakness.  Psychiatric:        Mood and Affect: Mood and affect and mood normal.        Behavior: Behavior normal.     ED Results / Procedures / Treatments   Labs (all labs ordered are listed, but only abnormal results are displayed) Labs Reviewed  COMPREHENSIVE METABOLIC PANEL - Abnormal; Notable for the following components:      Result Value   Glucose, Bld 105 (*)    BUN 26 (*)    Creatinine, Ser 1.22 (*)    GFR, Estimated 55 (*)    All other components within normal limits  CBC - Abnormal; Notable for the following components:   Hemoglobin 11.9 (*)    All other components within normal limits  URINALYSIS, ROUTINE W REFLEX MICROSCOPIC - Abnormal; Notable for the following components:   Leukocytes,Ua LARGE (*)    All other components within normal limits  RESP PANEL BY RT-PCR (FLU A&B, COVID) ARPGX2  LIPASE, BLOOD  CBG MONITORING, ED    EKG None  Radiology CT ABDOMEN PELVIS W CONTRAST  Result Date: 03/26/2020 CLINICAL DATA:  Right lower quadrant abdominal pain EXAM: CT ABDOMEN AND  PELVIS WITH CONTRAST TECHNIQUE: Multidetector CT imaging of the abdomen and pelvis was performed using the standard protocol following bolus administration of intravenous contrast. CONTRAST:  126mL OMNIPAQUE IOHEXOL 300 MG/ML  SOLN COMPARISON:  12/26/2019 FINDINGS: Lower chest: No acute abnormality. Hepatobiliary: No solid liver abnormality is seen. Hepatic steatosis. No gallstones, gallbladder wall thickening, or biliary dilatation. Pancreas: Unremarkable. No pancreatic ductal dilatation or surrounding inflammatory changes. Spleen: Normal in size without significant abnormality. Adrenals/Urinary Tract: Adrenal glands are unremarkable. Kidneys are normal, without renal calculi, solid lesion, or hydronephrosis. Bladder is unremarkable. Stomach/Bowel: Stomach is within normal limits. Appendix appears normal. No evidence of bowel wall thickening, distention, or inflammatory changes. Status post sigmoid colon resection and reanastomosis. Moderate burden of stool throughout the colon. Vascular/Lymphatic: No significant vascular findings are present. No enlarged abdominal or pelvic lymph nodes. Reproductive: Status post hysterectomy. Other: Midline ventral hernia containing multiple loops of nonobstructed mid small bowel, unchanged in appearance to prior examination (series 3, image 71). No abdominopelvic ascites. Musculoskeletal: No acute or significant osseous findings. IMPRESSION: 1. No acute CT findings of the abdomen or pelvis to  explain right lower quadrant abdominal pain. Normal appendix. 2. Midline ventral hernia containing multiple loops of nonobstructed mid small bowel, unchanged in appearance to prior examination. This may be symptomatic. No evidence of bowel obstruction. 3. Status post sigmoid colon resection and reanastomosis. Moderate burden of stool throughout the colon. 4. Hepatic steatosis. Electronically Signed   By: Eddie Candle M.D.   On: 03/26/2020 17:34    Procedures Procedures   Medications  Ordered in ED Medications  ondansetron (ZOFRAN) injection 4 mg (4 mg Intravenous Given 03/26/20 1649)  morphine 4 MG/ML injection 4 mg (4 mg Intravenous Given 03/26/20 1649)  lactated ringers bolus 1,000 mL (1,000 mLs Intravenous New Bag/Given 03/26/20 1648)  iohexol (OMNIPAQUE) 300 MG/ML solution 100 mL (100 mLs Intravenous Contrast Given 03/26/20 1725)    ED Course  I have reviewed the triage vital signs and the nursing notes.  Pertinent labs & imaging results that were available during my care of the patient were reviewed by me and considered in my medical decision making (see chart for details).    MDM Rules/Calculators/A&P                          Patient is a 41yoF with history and physical as described above who presents to the ED for RLQ abdominal pain and rectal prolapse. VS notable for tachycardia to 100s, otherwise reassuring and HDS. No acute distress and non-toxic appearing. Initial workup includes CBC, CMP, lipase, UA, and CT abdomen/pelvis. Initial treatment includes Zofran and morphrine and IVF bolus.  On reassessment, pain improved and patient resting comfortably. CT unremarkable for acute intraabdominal pathology. Remainder of diagnostic workup reassuring. Low suspicion for bowel obstruction, incarcerated/strangulated hernia, appendicitis, cholecystitis, diverticulitis, UTI, pyelonephritis, nephrolithiasis, or other acute emergent pathology at this time. Recommend Miralax stool softer for home for reported hard pellet-like stool, restricting her time spent sitting on commode to help prevent prolapse recurrence, and follow up with her PCP at [facility] for further evaluation of her ongoing abdominal pain. She is tolerating PO and appears well hydrated. No acute events during ED course. Patient states she has phenergan at home which she can take for nausea. Patient appropriate for discharge.  Strict return precautions provided and discussed. Questions and concerns addressed. Patient  verbalized understanding and amenable with discharge plan. Patient discharged in stable condition.  Final Clinical Impression(s) / ED Diagnoses Final diagnoses:  Right lower quadrant abdominal pain    Rx / DC Orders ED Discharge Orders         Ordered    polyethylene glycol (MIRALAX) 17 g packet  Daily        03/26/20 1856           Christy Gentles, MD 03/26/20 [place]    Dorie Rank, MD 03/26/20 2246

## 2020-03-26 NOTE — ED Triage Notes (Signed)
Pt. Stated, I have bowel prolapse for a week. I normally go to [place] or [facility]. But was told to come here.

## 2020-03-26 NOTE — ED Notes (Signed)
Patient transported to CT 

## 2020-03-29 ENCOUNTER — Other Ambulatory Visit: Payer: Self-pay

## 2020-03-29 ENCOUNTER — Emergency Department: Payer: Medicare Other

## 2020-03-29 ENCOUNTER — Inpatient Hospital Stay
Admission: EM | Admit: 2020-03-29 | Discharge: 2020-03-31 | DRG: 394 | Disposition: A | Discharge status: Home / Self Care | Payer: Medicare Other | Attending: Internal Medicine | Admitting: Internal Medicine

## 2020-03-29 DIAGNOSIS — G894 Chronic pain syndrome: Secondary | ICD-10-CM | POA: Diagnosis present

## 2020-03-29 DIAGNOSIS — E860 Dehydration: Secondary | ICD-10-CM

## 2020-03-29 DIAGNOSIS — J439 Emphysema, unspecified: Secondary | ICD-10-CM | POA: Diagnosis present

## 2020-03-29 DIAGNOSIS — G2581 Restless legs syndrome: Secondary | ICD-10-CM | POA: Diagnosis present

## 2020-03-29 DIAGNOSIS — Z8673 Personal history of transient ischemic attack (TIA), and cerebral infarction without residual deficits: Secondary | ICD-10-CM

## 2020-03-29 DIAGNOSIS — M797 Fibromyalgia: Secondary | ICD-10-CM | POA: Diagnosis present

## 2020-03-29 DIAGNOSIS — Z20822 Contact with and (suspected) exposure to covid-19: Secondary | ICD-10-CM | POA: Diagnosis present

## 2020-03-29 DIAGNOSIS — Z8262 Family history of osteoporosis: Secondary | ICD-10-CM

## 2020-03-29 DIAGNOSIS — E119 Type 2 diabetes mellitus without complications: Secondary | ICD-10-CM

## 2020-03-29 DIAGNOSIS — Z8249 Family history of ischemic heart disease and other diseases of the circulatory system: Secondary | ICD-10-CM

## 2020-03-29 DIAGNOSIS — Z87891 Personal history of nicotine dependence: Secondary | ICD-10-CM

## 2020-03-29 DIAGNOSIS — Z880 Allergy status to penicillin: Secondary | ICD-10-CM | POA: Diagnosis not present

## 2020-03-29 DIAGNOSIS — Z888 Allergy status to other drugs, medicaments and biological substances status: Secondary | ICD-10-CM

## 2020-03-29 DIAGNOSIS — I252 Old myocardial infarction: Secondary | ICD-10-CM

## 2020-03-29 DIAGNOSIS — N183 Chronic kidney disease, stage 3 unspecified: Secondary | ICD-10-CM | POA: Diagnosis present

## 2020-03-29 DIAGNOSIS — Z881 Allergy status to other antibiotic agents status: Secondary | ICD-10-CM

## 2020-03-29 DIAGNOSIS — K56609 Unspecified intestinal obstruction, unspecified as to partial versus complete obstruction: Secondary | ICD-10-CM | POA: Diagnosis not present

## 2020-03-29 DIAGNOSIS — K436 Other and unspecified ventral hernia with obstruction, without gangrene: Principal | ICD-10-CM | POA: Diagnosis present

## 2020-03-29 DIAGNOSIS — Z79890 Hormone replacement therapy (postmenopausal): Secondary | ICD-10-CM

## 2020-03-29 DIAGNOSIS — F319 Bipolar disorder, unspecified: Secondary | ICD-10-CM | POA: Diagnosis present

## 2020-03-29 DIAGNOSIS — K56600 Partial intestinal obstruction, unspecified as to cause: Secondary | ICD-10-CM | POA: Diagnosis present

## 2020-03-29 DIAGNOSIS — F3189 Other bipolar disorder: Secondary | ICD-10-CM | POA: Diagnosis present

## 2020-03-29 DIAGNOSIS — Z8679 Personal history of other diseases of the circulatory system: Secondary | ICD-10-CM

## 2020-03-29 DIAGNOSIS — Z79899 Other long term (current) drug therapy: Secondary | ICD-10-CM

## 2020-03-29 DIAGNOSIS — E039 Hypothyroidism, unspecified: Secondary | ICD-10-CM | POA: Diagnosis present

## 2020-03-29 DIAGNOSIS — Z833 Family history of diabetes mellitus: Secondary | ICD-10-CM

## 2020-03-29 DIAGNOSIS — E876 Hypokalemia: Secondary | ICD-10-CM | POA: Diagnosis present

## 2020-03-29 DIAGNOSIS — K439 Ventral hernia without obstruction or gangrene: Secondary | ICD-10-CM | POA: Diagnosis not present

## 2020-03-29 DIAGNOSIS — I2510 Atherosclerotic heart disease of native coronary artery without angina pectoris: Secondary | ICD-10-CM | POA: Diagnosis present

## 2020-03-29 DIAGNOSIS — Z791 Long term (current) use of non-steroidal anti-inflammatories (NSAID): Secondary | ICD-10-CM

## 2020-03-29 DIAGNOSIS — F419 Anxiety disorder, unspecified: Secondary | ICD-10-CM | POA: Diagnosis present

## 2020-03-29 DIAGNOSIS — E1122 Type 2 diabetes mellitus with diabetic chronic kidney disease: Secondary | ICD-10-CM | POA: Diagnosis present

## 2020-03-29 DIAGNOSIS — K219 Gastro-esophageal reflux disease without esophagitis: Secondary | ICD-10-CM | POA: Diagnosis present

## 2020-03-29 DIAGNOSIS — E1142 Type 2 diabetes mellitus with diabetic polyneuropathy: Secondary | ICD-10-CM | POA: Diagnosis present

## 2020-03-29 DIAGNOSIS — Z7984 Long term (current) use of oral hypoglycemic drugs: Secondary | ICD-10-CM

## 2020-03-29 DIAGNOSIS — I129 Hypertensive chronic kidney disease with stage 1 through stage 4 chronic kidney disease, or unspecified chronic kidney disease: Secondary | ICD-10-CM | POA: Diagnosis present

## 2020-03-29 DIAGNOSIS — Z882 Allergy status to sulfonamides status: Secondary | ICD-10-CM | POA: Diagnosis not present

## 2020-03-29 LAB — COMPREHENSIVE METABOLIC PANEL
ALT: 18 U/L (ref 0–44)
AST: 18 U/L (ref 15–41)
Albumin: 4.1 g/dL (ref 3.5–5.0)
Alkaline Phosphatase: 127 U/L — ABNORMAL HIGH (ref 38–126)
Anion gap: 11 (ref 5–15)
BUN: 28 mg/dL — ABNORMAL HIGH (ref 6–20)
CO2: 26 mmol/L (ref 22–32)
Calcium: 8.8 mg/dL — ABNORMAL LOW (ref 8.9–10.3)
Chloride: 102 mmol/L (ref 98–111)
Creatinine, Ser: 1 mg/dL (ref 0.44–1.00)
GFR, Estimated: >60 mL/min (ref >60)
Glucose, Bld: 162 mg/dL — ABNORMAL HIGH (ref 70–99)
Potassium: 3.3 mmol/L — ABNORMAL LOW (ref 3.5–5.1)
Sodium: 139 mmol/L (ref 135–145)
Total Bilirubin: 0.7 mg/dL (ref 0.3–1.2)
Total Protein: 7.7 g/dL (ref 6.5–8.1)

## 2020-03-29 LAB — URINALYSIS, COMPLETE (UACMP) WITH MICROSCOPIC
Bacteria, UA: NONE SEEN
Bilirubin Urine: NEGATIVE
Glucose, UA: NEGATIVE mg/dL
Hgb urine dipstick: NEGATIVE
Ketones, ur: NEGATIVE mg/dL
Leukocytes,Ua: NEGATIVE
Nitrite: NEGATIVE
Protein, ur: NEGATIVE mg/dL
Specific Gravity, Urine: 1.014 (ref 1.005–1.030)
pH: 5 (ref 5.0–8.0)

## 2020-03-29 LAB — CBC
HCT: 36 % (ref 36.0–46.0)
Hemoglobin: 11.5 g/dL — ABNORMAL LOW (ref 12.0–15.0)
MCH: 28.5 pg (ref 26.0–34.0)
MCHC: 31.9 g/dL (ref 30.0–36.0)
MCV: 89.3 fL (ref 80.0–100.0)
Platelets: 243 10*3/uL (ref 150–400)
RBC: 4.03 MIL/uL (ref 3.87–5.11)
RDW: 14.6 % (ref 11.5–15.5)
WBC: 8.8 10*3/uL (ref 4.0–10.5)
nRBC: 0 % (ref 0.0–0.2)

## 2020-03-29 LAB — TSH: TSH: 7.677 u[IU]/mL — ABNORMAL HIGH (ref 0.350–4.500)

## 2020-03-29 LAB — LIPASE, BLOOD: Lipase: 38 U/L (ref 11–51)

## 2020-03-29 LAB — LACTIC ACID, PLASMA: Lactic Acid, Venous: 0.7 mmol/L (ref 0.5–1.9)

## 2020-03-29 LAB — CBG MONITORING, ED: Glucose-Capillary: 99 mg/dL (ref 70–99)

## 2020-03-29 MED ORDER — CETIRIZINE HCL 10 MG PO TABS
10.0000 mg | ORAL_TABLET | Freq: Every evening | ORAL | Status: DC
  Filled 2020-03-29: qty 1

## 2020-03-29 MED ORDER — ROPINIROLE HCL 1 MG PO TABS
1.0000 mg | ORAL_TABLET | Freq: Three times a day (TID) | ORAL | Status: DC
  Administered 2020-03-30: 1 mg via ORAL
  Filled 2020-03-29: qty 1

## 2020-03-29 MED ORDER — CYCLOBENZAPRINE HCL 10 MG PO TABS: 10.0000 mg | ORAL_TABLET | Freq: Three times a day (TID) | ORAL | Status: DC | PRN

## 2020-03-29 MED ORDER — INSULIN ASPART 100 UNIT/ML ~~LOC~~ SOLN
0.0000 [IU] | SUBCUTANEOUS | Status: DC
  Filled 2020-03-29: qty 1

## 2020-03-29 MED ORDER — PREGABALIN 75 MG PO CAPS
150.0000 mg | ORAL_CAPSULE | Freq: Three times a day (TID) | ORAL | Status: DC
  Administered 2020-03-30 – 2020-03-31 (×5): 150 mg via ORAL
  Filled 2020-03-29 (×5): qty 2

## 2020-03-29 MED ORDER — POTASSIUM CHLORIDE CRYS ER 20 MEQ PO TBCR
20.0000 meq | EXTENDED_RELEASE_TABLET | Freq: Two times a day (BID) | ORAL | Status: DC
  Administered 2020-03-30: 20 meq via ORAL
  Filled 2020-03-29: qty 1

## 2020-03-29 MED ORDER — TRAZODONE HCL 50 MG PO TABS: 25.0000 mg | ORAL_TABLET | Freq: Every evening | ORAL | Status: DC | PRN

## 2020-03-29 MED ORDER — POTASSIUM CHLORIDE 10 MEQ/100ML IV SOLN
10.0000 meq | INTRAVENOUS | Status: AC
  Administered 2020-03-29 – 2020-03-30 (×5): 10 meq via INTRAVENOUS
  Filled 2020-03-29 (×4): qty 100

## 2020-03-29 MED ORDER — PANTOPRAZOLE SODIUM 40 MG IV SOLR
40.0000 mg | Freq: Two times a day (BID) | INTRAVENOUS | Status: DC
  Administered 2020-03-30 – 2020-03-31 (×3): 40 mg via INTRAVENOUS
  Filled 2020-03-29 (×3): qty 40

## 2020-03-29 MED ORDER — ENOXAPARIN SODIUM 60 MG/0.6ML ~~LOC~~ SOLN
50.0000 mg | SUBCUTANEOUS | Status: DC
  Filled 2020-03-29 (×2): qty 0.6

## 2020-03-29 MED ORDER — LACTATED RINGERS IV BOLUS
1000.0000 mL | Freq: Once | INTRAVENOUS | Status: AC
  Administered 2020-03-29: 1000 mL via INTRAVENOUS

## 2020-03-29 MED ORDER — BACLOFEN 10 MG PO TABS: 10.0000 mg | ORAL_TABLET | Freq: Three times a day (TID) | ORAL | Status: DC

## 2020-03-29 MED ORDER — HYDROMORPHONE HCL 1 MG/ML IJ SOLN
1.0000 mg | Freq: Once | INTRAMUSCULAR | Status: AC
  Administered 2020-03-29: 1 mg via INTRAVENOUS
  Filled 2020-03-29: qty 1

## 2020-03-29 MED ORDER — SODIUM CHLORIDE 0.9 % IV SOLN: INTRAVENOUS | Status: DC

## 2020-03-29 MED ORDER — ONDANSETRON HCL 4 MG/2ML IJ SOLN: 4.0000 mg | Freq: Four times a day (QID) | INTRAMUSCULAR | Status: DC | PRN

## 2020-03-29 MED ORDER — FLUTICASONE PROPIONATE 50 MCG/ACT NA SUSP
2.0000 | Freq: Every day | NASAL | Status: DC
  Administered 2020-03-30 – 2020-03-31 (×2): 2 via NASAL
  Filled 2020-03-29: qty 16

## 2020-03-29 MED ORDER — ACETAMINOPHEN 325 MG PO TABS: 650.0000 mg | ORAL_TABLET | Freq: Four times a day (QID) | ORAL | Status: DC | PRN

## 2020-03-29 MED ORDER — QUETIAPINE FUMARATE 100 MG PO TABS
100.0000 mg | ORAL_TABLET | Freq: Every day | ORAL | Status: DC
  Administered 2020-03-30: 100 mg via ORAL
  Filled 2020-03-29: qty 1

## 2020-03-29 MED ORDER — ONDANSETRON HCL 4 MG PO TABS: 4.0000 mg | ORAL_TABLET | Freq: Four times a day (QID) | ORAL | Status: DC | PRN

## 2020-03-29 MED ORDER — IOHEXOL 9 MG/ML PO SOLN
1000.0000 mL | Freq: Once | ORAL | Status: DC | PRN
  Administered 2020-03-29: 1000 mL via ORAL
  Filled 2020-03-29 (×2): qty 1000

## 2020-03-29 MED ORDER — IOHEXOL 300 MG/ML  SOLN
100.0000 mL | Freq: Once | INTRAMUSCULAR | Status: AC | PRN
  Administered 2020-03-29: 100 mL via INTRAVENOUS
  Filled 2020-03-29: qty 100

## 2020-03-29 MED ORDER — BENZTROPINE MESYLATE 0.5 MG PO TABS
0.5000 mg | ORAL_TABLET | Freq: Two times a day (BID) | ORAL | Status: DC | PRN
  Filled 2020-03-29: qty 1

## 2020-03-29 MED ORDER — LORAZEPAM 2 MG/ML IJ SOLN
0.5000 mg | Freq: Once | INTRAMUSCULAR | Status: AC
  Administered 2020-03-29: 0.5 mg via INTRAVENOUS
  Filled 2020-03-29: qty 1

## 2020-03-29 MED ORDER — ONDANSETRON HCL 4 MG/2ML IJ SOLN
4.0000 mg | Freq: Once | INTRAMUSCULAR | Status: AC
  Administered 2020-03-29: 4 mg via INTRAVENOUS
  Filled 2020-03-29: qty 2

## 2020-03-29 MED ORDER — BUSPIRONE HCL 15 MG PO TABS
15.0000 mg | ORAL_TABLET | Freq: Two times a day (BID) | ORAL | Status: DC
  Filled 2020-03-29: qty 1

## 2020-03-29 MED ORDER — LEVOTHYROXINE SODIUM 50 MCG PO TABS
175.0000 ug | ORAL_TABLET | Freq: Every day | ORAL | Status: DC
  Filled 2020-03-29: qty 1

## 2020-03-29 MED ORDER — VENLAFAXINE HCL ER 75 MG PO CP24
75.0000 mg | ORAL_CAPSULE | Freq: Every day | ORAL | Status: DC
  Filled 2020-03-29 (×2): qty 1

## 2020-03-29 MED ORDER — LORAZEPAM 0.5 MG PO TABS: 0.5000 mg | ORAL_TABLET | Freq: Every day | ORAL | Status: DC | PRN

## 2020-03-29 MED ORDER — INSULIN ASPART 100 UNIT/ML ~~LOC~~ SOLN: 0.0000 [IU] | SUBCUTANEOUS | Status: DC

## 2020-03-29 MED ORDER — BACLOFEN 10 MG PO TABS
20.0000 mg | ORAL_TABLET | Freq: Three times a day (TID) | ORAL | Status: DC
  Filled 2020-03-29 (×2): qty 2

## 2020-03-29 MED ORDER — ACETAMINOPHEN 650 MG RE SUPP: 650.0000 mg | Freq: Four times a day (QID) | RECTAL | Status: DC | PRN

## 2020-03-29 NOTE — ED Triage Notes (Signed)
Pt in via EMS from home with c/o abd pain for 2 weeks. Pt was seen at [facility] on Saturday for the same but they did nothing for her. Pt reports thinks it is a hernia that she has had before  140/88, HR 102, 100% RA

## 2020-03-29 NOTE — ED Triage Notes (Signed)
Pt comes via EMS from home with c/o right sided lower abdominal pain. Pt states this started few weeks ago. Pt states nausea.  Pt seen at [facility] for same but said they did nothing.

## 2020-03-29 NOTE — H&P (Addendum)
[place]   PATIENT NAME: Marie Clarke    MR#:  [id number]  DATE OF BIRTH:  1970/12/20  DATE OF ADMISSION:  03/29/2020  PRIMARY CARE PHYSICIAN: Sharyne Peach, MD   Patient is coming from: Home  REQUESTING/REFERRING PHYSICIAN: Hulan Saas, MD  CHIEF COMPLAINT:   Chief Complaint  Patient presents with  . Abdominal Pain    HISTORY OF PRESENT ILLNESS:  Marie Clarke is a 50 y.o. Caucasian female with medical history significant for multiple medical problems including anxiety, bipolar disorder, asthma, coronary artery disease, CHF, chronic pain syndrome, COPD, type 2 diabetes mellitus, fibromyalgia, hypertension and hypothyroidism, as well as no midline ventral hernia most recently evaluated at [facility] on 2/12 with abdominal CT scan revealing nonobstructive loops of bowel with ventral hernia, who presented to the emergency room with acute onset of worsening lower abdominal pain around her hernia.  She has been having pain over the last 2 to 3 weeks.  Over the last 24 hours it has been getting significantly worse with associated vomiting.  She denied any bilious vomitus or hematemesis. She currently has nausea with dry heaves. She has been having mild constipation.  No fever or chills.  No chest pain or dyspnea or cough or wheezing. No dysuria, oliguria or hematuria or flank pain. ED Course: Upon presentation to the emergency room, heart rate was 101 with otherwise normal vital signs.  Labs revealed blood glucose of 162 with a BUN of 28.  Alk phos was 127 lipase 38.  Lactic acid was 0.7.  CBC showed hemoglobin of 11.5.  UA was negative. EKG viewed and interpreted by myself shows a normal sinus rhythm at 94 bpm with no acute findings. Imaging: Abdominal and pelvic CT scan revealed: 1. Short segment of focally dilated small bowel within the anterior abdomen, slightly to the right of midline. This loop of bowel is associated with a complex ventral hernia that is described  above. Suspect that there is a partial obstruction related to the ventral hernia as the more distal small bowel loops are decompressed but do contain some intraluminal contrast. 2. Hepatic steatosis 3. Stable left adrenal nodule The patient was given 1 L bolus of IV lactated Ringer, 0.5 mg of IV Ativan, 1 mg of IV Dilaudid and 10 mEq of IV potassium chloride.  She will be admitted to a medical bed for further evaluation and management.  PAST MEDICAL HISTORY:   Past Medical History:  Diagnosis Date  . Anemia   . Anginal pain ([place])   . Anxiety   . Arthritis   . Asthma   . Bilateral lower extremity edema   . Bipolar disorder ([place])   . CAD (coronary artery disease) unk  . CHF (congestive heart failure) ([place])   . Chronic pain syndrome   . COPD (chronic obstructive pulmonary disease) ([facility])    emphysema  . DDD (degenerative disc disease), lumbar   . Depression unk  . Diabetes mellitus without complication ([place])   . Diabetes mellitus, type II ([place])   . Drug overdose 2013-04-27   after dad died  . Dysrhythmia    history of tachy arrythmias  . Fibromyalgia   . GERD (gastroesophageal reflux disease)   . Headache    chronic migraines  . Hyperlipidemia   . Hypertension   . Hypothyroidism   . Left leg pain 04/29/2014  . MI (myocardial infarction) ([place]) Apr 27, 2005  . Muscle ache 09/16/2014  . Osteoporosis   . Overactive bladder   .  Pancreatitis unk  . Panic attacks   . PTSD (post-traumatic stress disorder)   . Reflex sympathetic dystrophy   . Renal cyst, left   . Renal insufficiency    ckd stage iii  . Restless legs syndrome   . Sleep apnea 06/2019   uses cpap  . Sleep apnea   . Stroke ([place]) 2010   TIA x 2. no residual deficits  . Suicide attempt [facility])    after father died.  . Thyroid disease    thyroid nodule  . TIA (transient ischemic attack) unk  . TIA (transient ischemic attack)     PAST SURGICAL HISTORY:   Past Surgical History:  Procedure Laterality Date  . ABDOMINAL  HYSTERECTOMY    . CARDIAC CATHETERIZATION    . HERNIA REPAIR  07/15/2017   [facility]  . lumbar facet     several  . prolapse rectum surgery N/A July 2016  . THORACIC LAMINECTOMY FOR SPINAL CORD STIMULATOR N/A 07/27/2019   Procedure: THORACIC SPINAL CORD STIMULATOR PLACEMENT WITH RIGHT FLANK PULSE GENERATOR;  Surgeon: [name], MD;  Location: [facility] ORS;  Service: Neurosurgery;  Laterality: N/A;  . TONSILLECTOMY      SOCIAL HISTORY:   Social History   Tobacco Use  . Smoking status: Former Smoker    Packs/day: 0.50    Types: Cigarettes    Quit date: 09/01/2018    Years since quitting: 1.5  . Smokeless tobacco: Never Used  . Tobacco comment: Patient quit smoking 09/01/2018  Substance Use Topics  . Alcohol use: No    Alcohol/week: 0.0 standard drinks    FAMILY HISTORY:   Family History  Problem Relation Age of Onset  . Diabetes Mellitus II Mother   . CAD Mother   . Sleep apnea Mother   . Osteoarthritis Mother   . Osteoporosis Mother   . Anxiety disorder Mother   . Depression Mother   . Bipolar disorder Mother   . Bipolar disorder Father   . Hypertension Father   . Depression Father   . Anxiety disorder Father   . Post-traumatic stress disorder Sister     DRUG ALLERGIES:   Allergies  Allergen Reactions  . Diazepam Hives and Nausea And Vomiting  . Ziprasidone Hcl Other (See Comments) and Itching    CONFUSED CONFUSED Other reaction(s): Other (See Comments) CONFUSED CONFUSED   . Azithromycin Hives  . Divalproex Sodium Hives  . Levofloxacin Hives and Rash    1/31: would like to retry since not taking valium  . Lisinopril     Medicine cause kidney failure  . Metronidazole Hives    1/31: would like to retry since she is no longer taking valium  . Sulfa Antibiotics Hives  . Valproic Acid Itching and Rash  . Cephalexin Hives  . Ciprofloxacin Hives  . Penicillins Hives    Has patient had a PCN reaction causing immediate rash, facial/tongue/throat swelling, SOB or  lightheadedness with hypotension: No Has patient had a PCN reaction causing severe rash involving mucus membranes or skin necrosis: No Has patient had a PCN reaction that required hospitalization: No Has patient had a PCN reaction occurring within the last 10 years: No If all of the above answers are "NO", then may proceed with Cephalosporin use.     REVIEW OF SYSTEMS:   ROS As per history of present illness. All pertinent systems were reviewed above. Constitutional, HEENT, cardiovascular, respiratory, GI, GU, musculoskeletal, neuro, psychiatric, endocrine, integumentary and hematologic systems were reviewed and are otherwise negative/unremarkable except  for positive findings mentioned above in the HPI.   MEDICATIONS AT HOME:   Prior to Admission medications   Medication Sig Start Date End Date Taking? Authorizing Provider  Albuterol Sulfate (PROAIR RESPICLICK) 413 (90 Base) MCG/ACT AEPB Inhale into the lungs. 10/26/19   [provider]  baclofen (LIORESAL) 10 MG tablet Take 10 mg by mouth 3 (three) times daily. 03/18/20   [provider]  baclofen (LIORESAL) 20 MG tablet Take 20 mg by mouth 3 (three) times daily. 03/23/20   [provider]  benztropine (COGENTIN) 0.5 MG tablet Take 1 tablet (0.5 mg total) by mouth 2 (two) times daily as needed for tremors. 01/18/20   Ursula Alert, MD  busPIRone (BUSPAR) 15 MG tablet Take 1 tablet (15 mg total) by mouth 2 (two) times daily. 12/28/19   Ursula Alert, MD  cyclobenzaprine (FLEXERIL) 10 MG tablet Take by mouth. 12/17/19   [provider]  desvenlafaxine (PRISTIQ) 100 MG 24 hr tablet Take 1 tablet (100 mg total) by mouth daily. 01/01/20   Ursula Alert, MD  dicyclomine (BENTYL) 10 MG capsule Take by mouth. 10/16/19   [provider]  esomeprazole (NEXIUM) 40 MG capsule Take by mouth. 12/23/19 01/22/20  [provider]  fluticasone (FLONASE) 50 MCG/ACT nasal spray Place into the nose. 06/03/19    [provider]  gabapentin (NEURONTIN) 800 MG tablet Take 1 tablet (800 mg total) by mouth 3 (three) times daily for 15 days, THEN 1 tablet (800 mg total) 4 (four) times daily for 15 days. 01/13/20 02/12/20  Milinda Pointer, MD  glipiZIDE (GLUCOTROL XL) 10 MG 24 hr tablet Take by mouth. 11/17/19 11/16/20  [provider]  ibuprofen (ADVIL) 800 MG tablet Take by mouth. 11/04/19   [provider]  levocetirizine (XYZAL) 5 MG tablet  01/08/20   [provider]  levothyroxine (SYNTHROID) 175 MCG tablet Take by mouth. 11/10/19 11/09/20  [provider]  LORazepam (ATIVAN) 0.5 MG tablet Take 1 tablet (0.5 mg total) by mouth as directed. Take 1 tablet 3-4 times a week only for severe panic attacks 01/01/20   Ursula Alert, MD  meloxicam (MOBIC) 7.5 MG tablet Take 7.5 mg by mouth 2 (two) times daily. 02/20/20   [provider]  ondansetron (ZOFRAN-ODT) 8 MG disintegrating tablet Take by mouth. 12/29/19   [provider]  polyethylene glycol (MIRALAX) 17 g packet Take 17 g by mouth daily for 14 days. 03/26/20 04/09/20  Christy Gentles, MD  potassium chloride SA (KLOR-CON) 20 MEQ tablet Take 20 mEq by mouth 2 (two) times daily. 12/25/19   [provider]  pregabalin (LYRICA) 150 MG capsule Take 1 capsule (150 mg total) by mouth 3 (three) times daily. Do not take gabapentin (Neurontin) while taking this medication. 03/04/20 06/02/20  Milinda Pointer, MD  promethazine (PHENERGAN) 25 MG tablet Take 25 mg by mouth every 6 (six) hours as needed. 03/26/20   [provider]  QUEtiapine (SEROQUEL) 100 MG tablet Take by mouth. 11/25/19   [provider]  QUEtiapine (SEROQUEL) 25 MG tablet Take by mouth.    [provider]  rOPINIRole (REQUIP) 1 MG tablet Take by mouth. 12/10/19   [provider]      VITAL SIGNS:  Blood pressure 121/87, pulse (!) 101, temperature 98 F (36.7 C), resp. rate 17, SpO2 100  %.  PHYSICAL EXAMINATION:  Physical Exam  GENERAL:  50 y.o.-year-old Caucasian female patient lying in the bed with no acute distress.  EYES: Pupils equal,  round, reactive to light and accommodation. No scleral icterus. Extraocular muscles intact.  HEENT: Head atraumatic, normocephalic. Oropharynx with slightly dry mucous membrane and tongue and naso with NG tube in minimal clear drainage NECK:  Supple, no jugular venous distention. No thyroid enlargement, no tenderness.  LUNGS: Normal breath sounds bilaterally, no wheezing, rales,rhonchi or crepitation. No use of accessory muscles of respiration.  CARDIOVASCULAR: Regular rate and rhythm, S1, S2 normal. No murmurs, rubs, or gallops.  ABDOMEN: Soft, mildly distended with generalized tenderness mainly in the lower abdomen to the right of the midline around the ventral hernia without rebound tenderness guarding or rigidity.  Bowel sounds are significant diminished. No organomegaly or mass.  EXTREMITIES: No pedal edema, cyanosis, or clubbing.  NEUROLOGIC: Cranial nerves II through XII are intact. Muscle strength 5/5 in all extremities. Sensation intact. Gait not checked.  PSYCHIATRIC: The patient is alert and oriented x 3.  Normal affect and good eye contact. SKIN: No obvious rash, lesion, or ulcer.   LABORATORY PANEL:   CBC Recent Labs  Lab 03/29/20 1719  WBC 8.8  HGB 11.5*  HCT 36.0  PLT 243   ------------------------------------------------------------------------------------------------------------------  Chemistries  Recent Labs  Lab 03/29/20 1719  NA 139  K 3.3*  CL 102  CO2 26  GLUCOSE 162*  BUN 28*  CREATININE 1.00  CALCIUM 8.8*  AST 18  ALT 18  ALKPHOS 127*  BILITOT 0.7   ------------------------------------------------------------------------------------------------------------------  Cardiac Enzymes No results for input(s): TROPONINI in the last 168  hours. ------------------------------------------------------------------------------------------------------------------  RADIOLOGY:  CT ABDOMEN PELVIS W CONTRAST  Result Date: 03/29/2020 CLINICAL DATA:  Right-sided abdominal pain EXAM: CT ABDOMEN AND PELVIS WITH CONTRAST TECHNIQUE: Multidetector CT imaging of the abdomen and pelvis was performed using the standard protocol following bolus administration of intravenous contrast. CONTRAST:  124m OMNIPAQUE IOHEXOL 300 MG/ML  SOLN COMPARISON:  CT 03/26/2020, 12/26/2019 01/12/2019 FINDINGS: Lower chest: Lung bases demonstrate minimal focus of atelectasis at the anterior left lung base. Normal cardiac size. Hepatobiliary: Hepatic steatosis. No calcified gallstone or biliary dilatation Pancreas: Unremarkable. No pancreatic ductal dilatation or surrounding inflammatory changes. Spleen: Normal in size without focal abnormality. Adrenals/Urinary Tract: Right adrenal gland is normal. Stable 1.9 cm left adrenal gland nodule. Kidneys show no hydronephrosis. The bladder is normal Stomach/Bowel: The stomach is nonenlarged. Focally dilated loop of contrast containing small bowel slightly to the right of midline within the anterior abdomen. This distended bowel loop extends into complex ventral hernia. The more distal small bowel is decompressed but does contain some contrast. There is no bowel wall thickening. Negative appendix. Postsurgical changes of sigmoid colon Vascular/Lymphatic: Nonaneurysmal aorta.  No suspicious nodes Reproductive: Status post hysterectomy. No adnexal masses. Other: Negative for free air or free fluid. Complex ventral hernia. Cranial portion contains fat. Midportion of hernia contains contrast opacified dilated small bowel. This loop re-enters the abdomen in then extends into a slightly more caudal ventral hernia that also contains mildly dilated small bowel. Small bowel distal to the inferior-most hernia sac is decompressed. Musculoskeletal: No  acute or significant osseous findings. Thoracic stimulator leads to the approximate level of T7. IMPRESSION: 1. Short segment of focally dilated small bowel within the anterior abdomen, slightly to the right of midline. This loop of bowel is associated with a complex ventral hernia that is described above. Suspect that there is a partial obstruction related to the ventral hernia as the more distal small bowel loops are decompressed but do contain some intraluminal contrast. 2. Hepatic steatosis 3. Stable left adrenal  nodule Electronically Signed   By: Donavan Foil M.D.   On: 03/29/2020 20:52      IMPRESSION AND PLAN:  Active Problems:   Partial small bowel obstruction ([place])  1.  Partial small bowel obstruction with associated ventral hernia. -The patient will be admitted to a medical bed. -She will be kept n.p.o. -Pain management will be provided. -NG tube will be placed to intermittent suction. -General surgery consult to be obtained. -I notified Dr. Hampton Abbot about the patient. -Two-view abdomen x-ray will be obtained daily for follow-up. -Her hypokalemia will be replaced and magnesium level will be checked.  2.  Type 2 diabetes mellitus with peripheral neuropathy -The patient will be placed on supplement coverage with NovoLog and will continue her basal coverage. -We will continue her Lyrica.  3.  Bipolar disorder. -We will continue her Seroquel and Cogentin.  4.  Anxiety and depression. -We will continue her BuSpar and Pristiq.  5.  GERD. -The patient will be placed on IV PPI therapy.  6.  Hypothyroidism. -We will continue her Synthroid and check TSH level.  DVT prophylaxis: Lovenox. Code Status: full code. Family Communication:  The plan of care was discussed in details with the patient (and family). I answered all questions. The patient agreed to proceed with the above mentioned plan. Further management will depend upon hospital course. Disposition Plan: Back to previous  home environment Consults called: General surgery consult to Dr. Hampton Abbot. All the records are reviewed and case discussed with ED provider.  Status is: Inpatient  Remains inpatient appropriate because:Ongoing active pain requiring inpatient pain management, Ongoing diagnostic testing needed not appropriate for outpatient work up, Unsafe d/c plan, IV treatments appropriate due to intensity of illness or inability to take PO and Inpatient level of care appropriate due to severity of illness   Dispo: The patient is from: Home              Anticipated d/c is to: Home              Anticipated d/c date is: 3 days              Patient currently is not medically stable to d/c.   Difficult to place patient No   TOTAL TIME TAKING CARE OF THIS PATIENT: 55 minutes.    Christel Mormon M.D on 03/29/2020 at 9:47 PM  [facility]   From 7 PM-7 AM, contact night-coverage [email or web address]  CC: Primary care physician; Sharyne Peach, MD

## 2020-03-29 NOTE — ED Notes (Signed)
IV start unsuccessful, Dr. Tamala Julian notified and states will access via [place].

## 2020-03-29 NOTE — ED Provider Notes (Signed)
-----------------------------------------   10:27 PM on 03/29/2020 -----------------------------------------  EKG viewed and interpreted by myself shows a normal sinus rhythm at 94 bpm with a narrow QRS, normal axis, normal intervals, no concerning ST changes.   Harvest Dark, MD 03/29/20 2227

## 2020-03-29 NOTE — ED Notes (Signed)
Patient ready for transport to the floor per receiving RN. Hildred Alamin, RN to transport patient shortly.

## 2020-03-29 NOTE — ED Notes (Signed)
Unsuccessful attempt at IV x2. Will have other RN attempt.

## 2020-03-29 NOTE — ED Provider Notes (Addendum)
[facility] Emergency Department Provider Note  ____________________________________________   Event Date/Time   First MD Initiated Contact with Patient 03/29/20 1855     (approximate)  I have reviewed the triage vital signs and the nursing notes.   HISTORY  Chief Complaint Abdominal Pain   HPI Marie Clarke is a 50 y.o. female with a past medical history of rectal prolapse s/p sigmoid colonic resection w/ reanastamosis, CAD, CHF, COPD, depression, diabetes, anxiety, arthritis, anemia, fibromyalgia, bipolar disorder, GERD, CKD, obesity and known midline ventral hernia most recently evaluated [facility] on 2/12 with CT showing nonobstructed loops of bowel and ventral hernia who presents for assessment of acute on subacute pain around her hernia. Patient states she has had pain for at least 2 or 3 weeks but that seem to be getting worse over the last 24 hours. She states she had one episode of nonbloody nonbilious emesis and has been passing stool but only very little most recently yesterday. She denies any other acute symptoms including headache, earache, sore throat, chest pain, cough, back pain, rash extremity pain urinary symptoms or any other acute sick symptoms. No clearly getting area factors.         Past Medical History:  Diagnosis Date  . Anemia   . Anginal pain ([place])   . Anxiety   . Arthritis   . Asthma   . Bilateral lower extremity edema   . Bipolar disorder ([place])   . CAD (coronary artery disease) unk  . CHF (congestive heart failure) ([place])   . Chronic pain syndrome   . COPD (chronic obstructive pulmonary disease) (HCC)    emphysema  . DDD (degenerative disc disease), lumbar   . Depression unk  . Diabetes mellitus without complication ([place])   . Diabetes mellitus, type II ([place])   . Drug overdose 05/13/13   after dad died  . Dysrhythmia    history of tachy arrythmias  . Fibromyalgia   . GERD (gastroesophageal reflux disease)   . Headache     chronic migraines  . Hyperlipidemia   . Hypertension   . Hypothyroidism   . Left leg pain 04/29/2014  . MI (myocardial infarction) ([place]) 13-May-2005  . Muscle ache 09/16/2014  . Osteoporosis   . Overactive bladder   . Pancreatitis unk  . Panic attacks   . PTSD (post-traumatic stress disorder)   . Reflex sympathetic dystrophy   . Renal cyst, left   . Renal insufficiency    ckd stage iii  . Restless legs syndrome   . Sleep apnea 06/2019   uses cpap  . Sleep apnea   . Stroke ([place]) May 13, 2008   TIA x 2. no residual deficits  . Suicide attempt [facility])    after father died.  . Thyroid disease    thyroid nodule  . TIA (transient ischemic attack) unk  . TIA (transient ischemic attack)     Patient Active Problem List   Diagnosis Date Noted  . Hypokalemia   . SBO (small bowel obstruction) ([place]) 12/02/2019  . Chronic pain 12/02/2019  . Presence of neurostimulator (SCS) (June 2021) 10/06/2019  . Pain due to any device, implant or graft 09/21/2019  . Anxiety 09/10/2019  . Involuntary movements 09/10/2019  . Abnormal involuntary movement 08/18/2019  . Bipolar disorder, in full remission, most recent episode mixed ([place]) 08/11/2019  . Lumbosacral radiculopathy at L5 (Right) 04/09/2019  . Chronic migraine 03/12/2019  . Gout 01/21/2019  . Noncompliance with treatment regimen 12/29/2018  . BMI 40.0-44.9,  adult ([place]) 11/19/2018  . AKI (acute kidney injury) ([place]) 11/17/2018  . Postural dizziness with presyncope 09/29/2018  . Bipolar I disorder, most recent episode mixed ([place]) 09/10/2018  . Panic attacks 09/10/2018  . Chronic lumbosacral L5-S1 IVD protrusion (Bilateral) 08/25/2018  . Lumbar lateral recess stenosis (L5-S1) (Bilateral) 08/25/2018  . Wound disruption 08/05/2018  . Osteoarthritis involving multiple joints 07/10/2018  . Long term current use of non-steroidal anti-inflammatories (NSAID) 07/10/2018  . NSAID induced gastritis 07/10/2018  . DDD (degenerative disc disease), lumbosacral  04/22/2018  . Disease related peripheral neuropathy 11/13/2017  . Gastroesophageal reflux disease without esophagitis 11/13/2017  . Occipital headache (Bilateral) 11/13/2017  . Cervicogenic headache (Bilateral) (L>R) 11/13/2017  . History of postoperative nausea 10/22/2017  . Chronic shoulder pain (5th area of Pain) (Bilateral) (L>R) 10/09/2017  . Ankle joint instability (Left) 10/09/2017  . Ankle sprain, sequela (Left) 10/09/2017  . History of psychiatric symptoms 10/09/2017  . Chronic pain syndrome 10/02/2017  . Spondylosis without myelopathy or radiculopathy, lumbosacral region 10/02/2017  . Chronic musculoskeletal pain 10/02/2017  . Elevated C-reactive protein (CRP) 09/10/2017  . Elevated sed rate 09/10/2017  . Chronic neck pain (4th area of Pain) (Bilateral) (L>R) 09/09/2017  . Pharmacologic therapy 09/09/2017  . Disorder of skeletal system 09/09/2017  . Problems influencing health status 09/09/2017  . Long term current use of opiate analgesic 09/09/2017  . Tobacco use disorder 07/16/2017  . Ventral hernia without obstruction or gangrene 06/25/2017  . Strain of extensor muscle, fascia and tendon of left index finger at wrist and hand level, initial encounter 05/16/2017  . Sepsis ([place]) 04/14/2017  . Osteopenia 04/03/2017  . Hypotension 09/17/2016  . Contusion of knee (Left) 10/12/2015  . Strain of knee (Left) 10/12/2015  . Incidental lung nodule 04/28/2015  . Chronic low back pain (1ry area of Pain) (Bilateral) (L>R) 03/28/2015  . Chronic lower extremity pain (Referred) (2ry area of Pain) (Left) 03/28/2015  . Abdominal wound dehiscence 03/28/2015  . Encounter for pain management planning 03/28/2015  . Morbid obesity ([place]) 03/28/2015  . Abnormal CT scan, lumbar spine 03/28/2015  . Lumbar facet hypertrophy 03/28/2015  . Lumbar facet syndrome (Bilateral) (L>R) 03/28/2015  . Lumbar foraminal stenosis (Bilateral) (L5-S1) 03/28/2015  . Chronic ankle pain (3ry area of Pain) (Left)  03/28/2015  . Neurogenic pain 03/28/2015  . Neuropathic pain 03/28/2015  . Myofascial pain 03/28/2015  . History of suicide attempt 03/28/2015  . PTSD (post-traumatic stress disorder) 01/13/2015  . Abnormal gait 12/15/2014  . Congestive heart failure ([place]) 11/15/2014  . Abdominal wall abscess 09/20/2014  . 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 Diabetes mellitus, type 2 ([place]) 08/13/2014  . Bipolar affective disorder ([place]) 08/13/2014  . Type 2 diabetes mellitus ([place]) 08/13/2014  . Rectal prolapse 08/09/2014  . Rectal bleeding 08/09/2014  . Rectal bleed 08/09/2014  . Affective bipolar disorder ([place]) 08/05/2014  . Arteriosclerosis of coronary artery 08/05/2014  . CCF (congestive cardiac failure) ([place]) 08/05/2014  . Chronic kidney disease 08/05/2014  . 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 Apnea, sleep 08/05/2014  . Temporary cerebral vascular dysfunction 08/05/2014  . Polypharmacy 04/29/2014  . Other long term (current) drug therapy 04/29/2014  . 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 Chronic kidney disease, stage III (moderate) ([place]) 12/14/2013  . Controlled diabetes mellitus type II without complication ([place]) 28/78/6767  . Essential (primary) hypertension 12/03/2013  . Adult hypothyroidism 12/03/2013  . Controlled type 2 diabetes mellitus without complication ([place]) 20/94/7096    Past Surgical History:  Procedure Laterality Date  .  ABDOMINAL HYSTERECTOMY    . CARDIAC CATHETERIZATION    . HERNIA REPAIR  07/15/2017   [facility]  . lumbar facet     several  . prolapse rectum surgery N/A July 2016  . THORACIC LAMINECTOMY FOR SPINAL CORD STIMULATOR N/A 07/27/2019   Procedure: THORACIC SPINAL CORD STIMULATOR PLACEMENT WITH RIGHT FLANK PULSE GENERATOR;  Surgeon: [name], MD;  Location: [facility] ORS;  Service: Neurosurgery;  Laterality: N/A;  . TONSILLECTOMY      Prior to Admission medications   Medication Sig Start Date End Date Taking? Authorizing Provider  Albuterol Sulfate (PROAIR  RESPICLICK) 716 (90 Base) MCG/ACT AEPB Inhale into the lungs. 10/26/19   [provider]  benztropine (COGENTIN) 0.5 MG tablet Take 1 tablet (0.5 mg total) by mouth 2 (two) times daily as needed for tremors. 01/18/20   Ursula Alert, MD  busPIRone (BUSPAR) 15 MG tablet Take 1 tablet (15 mg total) by mouth 2 (two) times daily. 12/28/19   Ursula Alert, MD  cyclobenzaprine (FLEXERIL) 10 MG tablet Take by mouth. 12/17/19   [provider]  desvenlafaxine (PRISTIQ) 100 MG 24 hr tablet Take 1 tablet (100 mg total) by mouth daily. 01/01/20   Ursula Alert, MD  dicyclomine (BENTYL) 10 MG capsule Take by mouth. 10/16/19   [provider]  esomeprazole (NEXIUM) 40 MG capsule Take by mouth. 12/23/19 01/22/20  [provider]  fluticasone (FLONASE) 50 MCG/ACT nasal spray Place into the nose. 06/03/19   [provider]  gabapentin (NEURONTIN) 800 MG tablet Take 1 tablet (800 mg total) by mouth 3 (three) times daily for 15 days, THEN 1 tablet (800 mg total) 4 (four) times daily for 15 days. 01/13/20 02/12/20  Milinda Pointer, MD  glipiZIDE (GLUCOTROL XL) 10 MG 24 hr tablet Take by mouth. 11/17/19 11/16/20  [provider]  ibuprofen (ADVIL) 800 MG tablet Take by mouth. 11/04/19   [provider]  levocetirizine (XYZAL) 5 MG tablet  01/08/20   [provider]  levothyroxine (SYNTHROID) 175 MCG tablet Take by mouth. 11/10/19 11/09/20  [provider]  LORazepam (ATIVAN) 0.5 MG tablet Take 1 tablet (0.5 mg total) by mouth as directed. Take 1 tablet 3-4 times a week only for severe panic attacks 01/01/20   Ursula Alert, MD  ondansetron (ZOFRAN-ODT) 8 MG disintegrating tablet Take by mouth. 12/29/19   [provider]  polyethylene glycol (MIRALAX) 17 g packet Take 17 g by mouth daily for 14 days. 03/26/20 04/09/20  Christy Gentles, MD  potassium chloride SA (KLOR-CON) 20 MEQ tablet Take 20 mEq by mouth 2 (two) times daily. 12/25/19    [provider]  pregabalin (LYRICA) 150 MG capsule Take 1 capsule (150 mg total) by mouth 3 (three) times daily. Do not take gabapentin (Neurontin) while taking this medication. 03/04/20 06/02/20  Milinda Pointer, MD  QUEtiapine (SEROQUEL) 100 MG tablet Take by mouth. 11/25/19   [provider]  QUEtiapine (SEROQUEL) 25 MG tablet Take by mouth.    [provider]  rOPINIRole (REQUIP) 1 MG tablet Take by mouth. 12/10/19   [provider]    Allergies Diazepam, Ziprasidone hcl, Azithromycin, Divalproex sodium, Levofloxacin, Lisinopril, Metronidazole, Sulfa antibiotics, Valproic acid, Cephalexin, Ciprofloxacin, and Penicillins  Family History  Problem Relation Age of Onset  . Diabetes Mellitus II Mother   . CAD Mother   . Sleep apnea Mother   . Osteoarthritis Mother   . Osteoporosis Mother   . Anxiety disorder Mother   . Depression Mother   .  Bipolar disorder Mother   . Bipolar disorder Father   . Hypertension Father   . Depression Father   . Anxiety disorder Father   . Post-traumatic stress disorder Sister     Social History Social History   Tobacco Use  . Smoking status: Former Smoker    Packs/day: 0.50    Types: Cigarettes    Quit date: 09/01/2018    Years since quitting: 1.5  . Smokeless tobacco: Never Used  . Tobacco comment: Patient quit smoking 09/01/2018  Vaping Use  . Vaping Use: Never used  Substance Use Topics  . Alcohol use: No    Alcohol/week: 0.0 standard drinks  . Drug use: No    Review of Systems  Review of Systems  Constitutional: Negative for chills and fever.  HENT: Negative for sore throat.   Eyes: Negative for pain.  Respiratory: Negative for cough and stridor.   Cardiovascular: Negative for chest pain.  Gastrointestinal: Positive for abdominal pain, nausea and vomiting.  Genitourinary: Negative for dysuria.  Musculoskeletal: Negative for myalgias.  Skin: Negative for rash.  Neurological: Negative for  seizures, loss of consciousness and headaches.  Psychiatric/Behavioral: Negative for suicidal ideas.  All other systems reviewed and are negative.     ____________________________________________   PHYSICAL EXAM:  VITAL SIGNS: ED Triage Vitals [03/29/20 1717]  Enc Vitals Group     BP 121/87     Pulse Rate (!) 101     Resp 17     Temp 98 F (36.7 C)     Temp src      SpO2 100 %     Weight      Height      Head Circumference      Peak Flow      Pain Score 8     Pain Loc      Pain Edu?      Excl. in [place]?    Vitals:   03/29/20 1717  BP: 121/87  Pulse: (!) 101  Resp: 17  Temp: 98 F (36.7 C)  SpO2: 100%   Physical Exam Vitals and nursing note reviewed.  Constitutional:      General: She is in acute distress.     Appearance: She is well-developed and well-nourished. She is ill-appearing.  HENT:     Head: Normocephalic and atraumatic.     Right Ear: External ear normal.     Left Ear: External ear normal.     Nose: Nose normal.     Mouth/Throat:     Mouth: Mucous membranes are dry.  Eyes:     Conjunctiva/sclera: Conjunctivae normal.  Cardiovascular:     Rate and Rhythm: Normal rate and regular rhythm.     Heart sounds: No murmur heard.   Pulmonary:     Effort: Pulmonary effort is normal. No respiratory distress.     Breath sounds: Normal breath sounds.  Abdominal:     Palpations: Abdomen is soft.     Tenderness: There is abdominal tenderness in the epigastric area.  Musculoskeletal:        General: No edema.     Cervical back: Neck supple.  Skin:    General: Skin is warm and dry.     Capillary Refill: Capillary refill takes 2 to 3 seconds.  Neurological:     General: No focal deficit present.     Mental Status: She is alert.  Psychiatric:        Mood and Affect: Mood and affect normal.  Midline abdominal scar with palpable abdominal ventral hernia with palpable bowel there hyperactive. No significant overlying skin  changes. ____________________________________________   LABS (all labs ordered are listed, but only abnormal results are displayed)  Labs Reviewed  COMPREHENSIVE METABOLIC PANEL - Abnormal; Notable for the following components:      Result Value   Potassium 3.3 (*)    Glucose, Bld 162 (*)    BUN 28 (*)    Calcium 8.8 (*)    Alkaline Phosphatase 127 (*)    All other components within normal limits  CBC - Abnormal; Notable for the following components:   Hemoglobin 11.5 (*)    All other components within normal limits  URINALYSIS, COMPLETE (UACMP) WITH MICROSCOPIC - Abnormal; Notable for the following components:   Color, Urine YELLOW (*)    APPearance CLEAR (*)    All other components within normal limits  SARS CORONAVIRUS 2 (TAT 6-24 HRS)  LIPASE, BLOOD  LACTIC ACID, PLASMA  HEMOGLOBIN A1C  CBG MONITORING, ED   ____________________________________________  EKG  ____________________________________________  RADIOLOGY  ED MD interpretation: CT abdomen showed this does show a likely partial obstruction of small bowel in patient's ventral hernia.  No other clear acute abdominal pelvic process to explain patient's pain.  Official radiology report(s): CT ABDOMEN PELVIS W CONTRAST  Result Date: 03/29/2020 CLINICAL DATA:  Right-sided abdominal pain EXAM: CT ABDOMEN AND PELVIS WITH CONTRAST TECHNIQUE: Multidetector CT imaging of the abdomen and pelvis was performed using the standard protocol following bolus administration of intravenous contrast. CONTRAST:  169mL OMNIPAQUE IOHEXOL 300 MG/ML  SOLN COMPARISON:  CT 03/26/2020, 12/26/2019 01/12/2019 FINDINGS: Lower chest: Lung bases demonstrate minimal focus of atelectasis at the anterior left lung base. Normal cardiac size. Hepatobiliary: Hepatic steatosis. No calcified gallstone or biliary dilatation Pancreas: Unremarkable. No pancreatic ductal dilatation or surrounding inflammatory changes. Spleen: Normal in size without focal  abnormality. Adrenals/Urinary Tract: Right adrenal gland is normal. Stable 1.9 cm left adrenal gland nodule. Kidneys show no hydronephrosis. The bladder is normal Stomach/Bowel: The stomach is nonenlarged. Focally dilated loop of contrast containing small bowel slightly to the right of midline within the anterior abdomen. This distended bowel loop extends into complex ventral hernia. The more distal small bowel is decompressed but does contain some contrast. There is no bowel wall thickening. Negative appendix. Postsurgical changes of sigmoid colon Vascular/Lymphatic: Nonaneurysmal aorta.  No suspicious nodes Reproductive: Status post hysterectomy. No adnexal masses. Other: Negative for free air or free fluid. Complex ventral hernia. Cranial portion contains fat. Midportion of hernia contains contrast opacified dilated small bowel. This loop re-enters the abdomen in then extends into a slightly more caudal ventral hernia that also contains mildly dilated small bowel. Small bowel distal to the inferior-most hernia sac is decompressed. Musculoskeletal: No acute or significant osseous findings. Thoracic stimulator leads to the approximate level of T7. IMPRESSION: 1. Short segment of focally dilated small bowel within the anterior abdomen, slightly to the right of midline. This loop of bowel is associated with a complex ventral hernia that is described above. Suspect that there is a partial obstruction related to the ventral hernia as the more distal small bowel loops are decompressed but do contain some intraluminal contrast. 2. Hepatic steatosis 3. Stable left adrenal nodule Electronically Signed   By: Donavan Foil M.D.   On: 03/29/2020 20:52    ____________________________________________   PROCEDURES  Procedure(s) performed (including Critical Care):  .1-3 Lead EKG Interpretation Performed by: Lucrezia Starch, MD Authorized by: Lucrezia Starch,  MD     Interpretation: normal     ECG rate assessment:  normal     Rhythm: sinus rhythm     Ectopy: none     Conduction: normal       ____________________________________________   INITIAL IMPRESSION / ASSESSMENT AND PLAN / ED COURSE      Patient presents with above-stated exam for assessment of acute on subacute abdominal pain around the site of her known ventral hernia. On arrival she is slightly tachycardic with a stable vital signs on room air.  On exam she is quite tender of her abdomen appears slightly hydrated. She does have a palpable hernia.  I did review her recent visit at [facility] on 2/12 when she underwent evaluation with labs and CT. CT at that time showed "1. No acute CT findings of the abdomen or pelvis to explain right lower quadrant abdominal pain. Normal appendix. Midline ventral hernia containing multiple loops of non-obstructed mid small bowel, unchanged in appearance to prior examination. This may be symptomatic. No evidence of bowel obstruction. Status post sigmoid colon resection and reanastomosis. Moderate burden of stool throughout the colon. Hepatic steatosis."  Over given patient states her pain is worsening and she does have hyperactive couple bowel and hernia and concern for possible incarceration and SBO.  CT obtained does show evidence of likely partial small bowel obstruction with loops of bowel in patient's hernia.  No other clear acute abdominal or pelvic pathology at this time.  Lipase 38 not consistent with acute pancreatitis.  CMP remarkable for K of 3.3 and mild hyperglycemia with a glucose of 162 with no other significant electrolyte or metabolic derangements.  CBC shows no leukocytosis and hemoglobin at baseline.  UA does not appear infected.  Lactic acid is nonelevated and a very low suspicion for sepsis or acute bowel ischemia at this time.  I did attempt to reduce this hernia at bedside although patient does not tolerate even minimal palpation of the loops of bowel after Dilaudid.  Will defer further  attempts at this time.  Given concern for partial SBO we did discuss patient presentation work-up with on-call general surgeon Dr. Hampton Abbot who recommended placing NG tube.  He also recommended hospitalist admission given patient has been admitted for almost identical complaint in December of last year and was medically managed as she was deemed a very poor surgical candidate secondary to her surgical complexity and comorbidities and did not ultimately get transferred but was discharged with outpatient surgery follow-up at [facility]  I will plan to admit patient to medicine. ____________________________________________   FINAL CLINICAL IMPRESSION(S) / ED DIAGNOSES  Final diagnoses:  Partial small bowel obstruction (HCC)  Mild dehydration  Hypokalemia    Medications  insulin aspart (novoLOG) injection 0-15 Units (0 Units Subcutaneous Not Given 03/29/20 2053)  iohexol (OMNIPAQUE) 9 MG/ML oral solution 1,000 mL (1,000 mLs Oral Contrast Given 03/29/20 1924)  potassium chloride 10 mEq in 100 mL IVPB (has no administration in time range)  LORazepam (ATIVAN) injection 0.5 mg (has no administration in time range)  HYDROmorphone (DILAUDID) injection 1 mg (1 mg Intravenous Given 03/29/20 2054)  lactated ringers bolus 1,000 mL (1,000 mLs Intravenous New Bag/Given 03/29/20 2045)  ondansetron (ZOFRAN) injection 4 mg (4 mg Intravenous Given 03/29/20 2049)  iohexol (OMNIPAQUE) 300 MG/ML solution 100 mL (100 mLs Intravenous Contrast Given 03/29/20 2029)     ED Discharge Orders    None       Note:  This document was prepared using  [employer] training and education officer and may include unintentional dictation errors.   Lucrezia Starch, MD 03/29/20 2115    Lucrezia Starch, MD 03/29/20 2132

## 2020-03-29 NOTE — ED Notes (Signed)
Floor RN messaged for report.

## 2020-03-30 ENCOUNTER — Encounter: Payer: Self-pay | Admitting: Family Medicine

## 2020-03-30 ENCOUNTER — Inpatient Hospital Stay: Payer: Medicare Other

## 2020-03-30 DIAGNOSIS — K56600 Partial intestinal obstruction, unspecified as to cause: Secondary | ICD-10-CM | POA: Diagnosis not present

## 2020-03-30 DIAGNOSIS — K439 Ventral hernia without obstruction or gangrene: Secondary | ICD-10-CM

## 2020-03-30 DIAGNOSIS — K56609 Unspecified intestinal obstruction, unspecified as to partial versus complete obstruction: Secondary | ICD-10-CM

## 2020-03-30 LAB — CBC
HCT: 33.1 % — ABNORMAL LOW (ref 36.0–46.0)
Hemoglobin: 10.5 g/dL — ABNORMAL LOW (ref 12.0–15.0)
MCH: 28.8 pg (ref 26.0–34.0)
MCHC: 31.7 g/dL (ref 30.0–36.0)
MCV: 90.7 fL (ref 80.0–100.0)
Platelets: 206 10*3/uL (ref 150–400)
RBC: 3.65 MIL/uL — ABNORMAL LOW (ref 3.87–5.11)
RDW: 14.6 % (ref 11.5–15.5)
WBC: 5.9 10*3/uL (ref 4.0–10.5)
nRBC: 0 % (ref 0.0–0.2)

## 2020-03-30 LAB — BASIC METABOLIC PANEL
Anion gap: 9 (ref 5–15)
BUN: 21 mg/dL — ABNORMAL HIGH (ref 6–20)
CO2: 29 mmol/L (ref 22–32)
Calcium: 8.7 mg/dL — ABNORMAL LOW (ref 8.9–10.3)
Chloride: 103 mmol/L (ref 98–111)
Creatinine, Ser: 0.78 mg/dL (ref 0.44–1.00)
GFR, Estimated: >60 mL/min (ref >60)
Glucose, Bld: 95 mg/dL (ref 70–99)
Potassium: 4.2 mmol/L (ref 3.5–5.1)
Sodium: 141 mmol/L (ref 135–145)

## 2020-03-30 LAB — GLUCOSE, CAPILLARY
Glucose-Capillary: 100 mg/dL — ABNORMAL HIGH (ref 70–99)
Glucose-Capillary: 86 mg/dL (ref 70–99)
Glucose-Capillary: 88 mg/dL (ref 70–99)
Glucose-Capillary: 91 mg/dL (ref 70–99)
Glucose-Capillary: 95 mg/dL (ref 70–99)
Glucose-Capillary: 96 mg/dL (ref 70–99)

## 2020-03-30 LAB — SARS CORONAVIRUS 2 (TAT 6-24 HRS): SARS Coronavirus 2: NEGATIVE

## 2020-03-30 LAB — MAGNESIUM: Magnesium: 2.1 mg/dL (ref 1.7–2.4)

## 2020-03-30 MED ORDER — ONDANSETRON HCL 4 MG/2ML IJ SOLN
4.0000 mg | Freq: Four times a day (QID) | INTRAMUSCULAR | Status: DC | PRN
  Administered 2020-03-30 – 2020-03-31 (×3): 4 mg via INTRAVENOUS
  Filled 2020-03-30 (×3): qty 2

## 2020-03-30 MED ORDER — BUSPIRONE HCL 15 MG PO TABS
15.0000 mg | ORAL_TABLET | Freq: Two times a day (BID) | ORAL | Status: DC
  Administered 2020-03-30 – 2020-03-31 (×3): 15 mg
  Filled 2020-03-30 (×4): qty 1

## 2020-03-30 MED ORDER — ACETAMINOPHEN 160 MG/5ML PO SOLN
650.0000 mg | Freq: Four times a day (QID) | ORAL | Status: DC | PRN
  Filled 2020-03-30: qty 20.3

## 2020-03-30 MED ORDER — QUETIAPINE FUMARATE 25 MG PO TABS
25.0000 mg | ORAL_TABLET | Freq: Two times a day (BID) | ORAL | Status: DC
  Administered 2020-03-30 (×2): 25 mg via ORAL
  Filled 2020-03-30 (×2): qty 1

## 2020-03-30 MED ORDER — KETOROLAC TROMETHAMINE 15 MG/ML IJ SOLN
15.0000 mg | Freq: Four times a day (QID) | INTRAMUSCULAR | Status: DC | PRN
  Administered 2020-03-30 (×2): 15 mg via INTRAVENOUS
  Filled 2020-03-30 (×2): qty 1

## 2020-03-30 MED ORDER — MORPHINE SULFATE (PF) 2 MG/ML IV SOLN
2.0000 mg | INTRAVENOUS | Status: DC | PRN
  Administered 2020-03-30 – 2020-03-31 (×2): 2 mg via INTRAVENOUS
  Filled 2020-03-30 (×2): qty 1

## 2020-03-30 MED ORDER — BENZTROPINE MESYLATE 0.5 MG PO TABS
0.5000 mg | ORAL_TABLET | Freq: Two times a day (BID) | ORAL | Status: DC | PRN
  Filled 2020-03-30: qty 1

## 2020-03-30 MED ORDER — CYCLOBENZAPRINE HCL 10 MG PO TABS: 10.0000 mg | ORAL_TABLET | Freq: Three times a day (TID) | ORAL | Status: DC | PRN

## 2020-03-30 MED ORDER — ROPINIROLE HCL 1 MG PO TABS
1.0000 mg | ORAL_TABLET | Freq: Three times a day (TID) | ORAL | Status: DC
  Administered 2020-03-30 – 2020-03-31 (×4): 1 mg
  Filled 2020-03-30 (×4): qty 1

## 2020-03-30 MED ORDER — CETIRIZINE HCL 10 MG PO TABS
10.0000 mg | ORAL_TABLET | Freq: Every evening | ORAL | Status: DC
  Administered 2020-03-30: 10 mg
  Filled 2020-03-30 (×3): qty 1

## 2020-03-30 MED ORDER — KCL IN DEXTROSE-NACL 20-5-0.9 MEQ/L-%-% IV SOLN
INTRAVENOUS | Status: DC
  Filled 2020-03-30 (×4): qty 1000

## 2020-03-30 MED ORDER — LEVOTHYROXINE SODIUM 50 MCG PO TABS
175.0000 ug | ORAL_TABLET | Freq: Every day | ORAL | Status: DC
  Administered 2020-03-30 – 2020-03-31 (×2): 175 ug
  Filled 2020-03-30: qty 1

## 2020-03-30 MED ORDER — TRAZODONE HCL 50 MG PO TABS: 25.0000 mg | ORAL_TABLET | Freq: Every evening | ORAL | Status: DC | PRN

## 2020-03-30 MED ORDER — POTASSIUM CHLORIDE 20 MEQ PO PACK
20.0000 meq | PACK | Freq: Two times a day (BID) | ORAL | Status: DC
  Administered 2020-03-30 – 2020-03-31 (×3): 20 meq
  Filled 2020-03-30 (×3): qty 1

## 2020-03-30 MED ORDER — QUETIAPINE FUMARATE 100 MG PO TABS
100.0000 mg | ORAL_TABLET | Freq: Every day | ORAL | Status: DC
  Administered 2020-03-30: 100 mg
  Filled 2020-03-30: qty 1

## 2020-03-30 MED ORDER — QUETIAPINE FUMARATE 25 MG PO TABS
25.0000 mg | ORAL_TABLET | Freq: Two times a day (BID) | ORAL | Status: DC
  Administered 2020-03-31: 25 mg
  Filled 2020-03-30: qty 1

## 2020-03-30 MED ORDER — BACLOFEN 10 MG PO TABS
20.0000 mg | ORAL_TABLET | Freq: Three times a day (TID) | ORAL | Status: DC
  Administered 2020-03-30 – 2020-03-31 (×4): 20 mg
  Filled 2020-03-30 (×6): qty 2

## 2020-03-30 MED ORDER — ONDANSETRON HCL 4 MG PO TABS: 4.0000 mg | ORAL_TABLET | Freq: Four times a day (QID) | ORAL | Status: DC | PRN

## 2020-03-30 NOTE — Care Plan (Signed)
Pt states she would like to eat and have a diet ordered. PA Delena Bali notified. PA states to place CLD order and clamp NG tube. Order placed and followed.

## 2020-03-30 NOTE — Clinical Social Work Note (Signed)
Went by room for readmission prevention screen but patient fell asleep during introductions. Will try again later.  Marie Clarke, Marie Clarke

## 2020-03-30 NOTE — Progress Notes (Addendum)
Progress Note    Marie Clarke  JOI:[id number] DOB: March 08, 1970  DOA: 03/29/2020 PCP: Sharyne Peach, MD      Brief Narrative:    Medical records reviewed and are as summarized below:  Marie Clarke is a 50 y.o. female       Assessment/Plan:   Active Problems:   Partial small bowel obstruction (HCC)   Partial small bowel obstruction: Continue bowel rest.  Continue NG tube to intermittent low wall suction.  Analgesics as needed for pain.  Antiemetics as needed for nausea/vomiting.  Continue IV fluids for hydration.  Change IV fluids from normal saline to D5 normal saline with KCl.  Follow-up with general surgeon for further recommendations.  Bipolar disorder: Continue psychotropics  Type II DM with peripheral neuropathy: NovoLog as needed for hyperglycemia  Hypokalemia: Improved.  Continue potassium repletion.  Monitor levels.  Other comorbidities include anxiety, depression, GERD and hypothyroidism.  Continue home meds as able.  Diet Order            Diet NPO time specified Except for: Sips with Meds  Diet effective now                    Consultants:  General surgeon  Procedures:  None    Medications:   . baclofen  20 mg Per Tube TID  . busPIRone  15 mg Per Tube BID  . cetirizine  10 mg Per Tube QPM  . enoxaparin (LOVENOX) injection  50 mg Subcutaneous Q24H  . fluticasone  2 spray Each Nare Daily  . insulin aspart  0-15 Units Subcutaneous Q4H  . levothyroxine  175 mcg Per Tube Q0600  . pantoprazole (PROTONIX) IV  40 mg Intravenous Q12H  . potassium chloride  20 mEq Per Tube BID  . pregabalin  150 mg Oral TID  . QUEtiapine  100 mg Per Tube QHS  . QUEtiapine  25 mg Oral BID  . rOPINIRole  1 mg Per Tube TID  . venlafaxine XR  75 mg Oral Q breakfast   Continuous Infusions: . sodium chloride 100 mL/hr at 03/30/20 0847     Anti-infectives (From admission, onward)   None             Family Communication/Anticipated D/C date and  plan/Code Status   DVT prophylaxis:      Code Status: Full Code  Family Communication: None Disposition Plan:    Status is: Inpatient  Remains inpatient appropriate because:IV treatments appropriate due to intensity of illness or inability to take PO and Inpatient level of care appropriate due to severity of illness   Dispo: The patient is from: Home              Anticipated d/c is to: Home              Anticipated d/c date is: 2 days              Patient currently is not medically stable to d/c.   Difficult to place patient No           Subjective:   C/o abdominal pain.  No bowel movement or flatus today.  No vomiting.  Objective:    Vitals:   03/29/20 2343 03/30/20 0533 03/30/20 0758 03/30/20 1141  BP: 120/83 102/78 100/69 113/78  Pulse: 96 93 81 90  Resp: 16 16 17 17   Temp: 98.7 F (37.1 C) 98.3 F (36.8 C) 98.6 F (37 C) 98.2 F (36.8 C)  TempSrc:  Axillary Oral Oral  SpO2: 98% 96% 95% 95%   No data found.   Intake/Output Summary (Last 24 hours) at 03/30/2020 1318 Last data filed at 03/30/2020 0847 Gross per 24 hour  Intake 539.91 ml  Output 0 ml  Net 539.91 ml   There were no vitals filed for this visit.  Exam:  GEN: NAD SKIN: Warm and dry EYES: EOMI ENT: MMM, NG tube connected to intermittent low wall suction CV: RRR PULM: CTA B ABD: soft, obese, mid abdominal tenderness, no rebound tenderness or guarding, +BS CNS: AAO x 3, non focal EXT: No edema or tenderness        Data Reviewed:   I have personally reviewed following labs and imaging studies:  Labs: Labs show the following:   Basic Metabolic Panel: Recent Labs  Lab 03/26/20 1441 03/29/20 1719 03/30/20 0444  NA 140 139 141  K 3.9 3.3* 4.2  CL 102 102 103  CO2 24 26 29   GLUCOSE 105* 162* 95  BUN 26* 28* 21*  CREATININE 1.22* 1.00 0.78  CALCIUM 9.1 8.8* 8.7*  MG  --  2.1  --    GFR Estimated Creatinine Clearance: 99.9 mL/min (by C-G formula based on SCr of  0.78 mg/dL). Liver Function Tests: Recent Labs  Lab 03/26/20 1441 03/29/20 1719  AST 25 18  ALT 20 18  ALKPHOS 125 127*  BILITOT 0.6 0.7  PROT 7.2 7.7  ALBUMIN 3.8 4.1   Recent Labs  Lab 03/26/20 1441 03/29/20 1719  LIPASE 36 38   No results for input(s): AMMONIA in the last 168 hours. Coagulation profile No results for input(s): INR, PROTIME in the last 168 hours.  CBC: Recent Labs  Lab 03/26/20 1441 03/29/20 1719 03/30/20 0444  WBC 6.4 8.8 5.9  HGB 11.9* 11.5* 10.5*  HCT 38.1 36.0 33.1*  MCV 90.9 89.3 90.7  PLT 248 243 206   Cardiac Enzymes: No results for input(s): CKTOTAL, CKMB, CKMBINDEX, TROPONINI in the last 168 hours. BNP (last 3 results) No results for input(s): PROBNP in the last 8760 hours. CBG: Recent Labs  Lab 03/29/20 2052 03/30/20 0049 03/30/20 0520 03/30/20 0748 03/30/20 1147  GLUCAP 99 88 91 95 86   D-Dimer: No results for input(s): DDIMER in the last 72 hours. Hgb A1c: No results for input(s): HGBA1C in the last 72 hours. Lipid Profile: No results for input(s): CHOL, HDL, LDLCALC, TRIG, CHOLHDL, LDLDIRECT in the last 72 hours. Thyroid function studies: Recent Labs    03/29/20 1719  TSH 7.677*   Anemia work up: No results for input(s): VITAMINB12, FOLATE, FERRITIN, TIBC, IRON, RETICCTPCT in the last 72 hours. Sepsis Labs: Recent Labs  Lab 03/26/20 1441 03/29/20 1719 03/29/20 1950 03/30/20 0444  WBC 6.4 8.8  --  5.9  LATICACIDVEN  --   --  0.7  --     Microbiology Recent Results (from the past 240 hour(s))  Resp Panel by RT-PCR (Flu A&B, Covid) Nasopharyngeal Swab     Status: None   Collection Time: 03/26/20  4:56 PM   Specimen: Nasopharyngeal Swab; Nasopharyngeal(NP) swabs in vial transport medium  Result Value Ref Range Status   SARS Coronavirus 2 by RT PCR NEGATIVE NEGATIVE Final    Comment: (NOTE) SARS-CoV-2 target nucleic acids are NOT DETECTED.  The SARS-CoV-2 RNA is generally detectable in upper  respiratory specimens during the acute phase of infection. The lowest concentration of SARS-CoV-2 viral copies this assay can detect is 138 copies/mL. A negative result does not  preclude SARS-Cov-2 infection and should not be used as the sole basis for treatment or other patient management decisions. A negative result may occur with  improper specimen collection/handling, submission of specimen other than nasopharyngeal swab, presence of viral mutation(s) within the areas targeted by this assay, and inadequate number of viral copies(<138 copies/mL). A negative result must be combined with clinical observations, patient history, and epidemiological information. The expected result is Negative.  Fact Sheet for Patients:  EntrepreneurPulse.com.au  Fact Sheet for Healthcare Providers:  IncredibleEmployment.be  This test is no t yet approved or cleared by the [place] FDA and  has been authorized for detection and/or diagnosis of SARS-CoV-2 by FDA under an Emergency Use Authorization (EUA). This EUA will remain  in effect (meaning this test can be used) for the duration of the COVID-19 declaration under Section 564(b)(1) of the Act, 21 U.S.C.section 360bbb-3(b)(1), unless the authorization is terminated  or revoked sooner.       Influenza A by PCR NEGATIVE NEGATIVE Final   Influenza B by PCR NEGATIVE NEGATIVE Final    Comment: (NOTE) The Xpert Xpress SARS-CoV-2/FLU/RSV plus assay is intended as an aid in the diagnosis of influenza from Nasopharyngeal swab specimens and should not be used as a sole basis for treatment. Nasal washings and aspirates are unacceptable for Xpert Xpress SARS-CoV-2/FLU/RSV testing.  Fact Sheet for Patients: EntrepreneurPulse.com.au  Fact Sheet for Healthcare Providers: IncredibleEmployment.be  This test is not yet approved or cleared by the [place] FDA and has been  authorized for detection and/or diagnosis of SARS-CoV-2 by FDA under an Emergency Use Authorization (EUA). This EUA will remain in effect (meaning this test can be used) for the duration of the COVID-19 declaration under Section 564(b)(1) of the Act, 21 U.S.C. section 360bbb-3(b)(1), unless the authorization is terminated or revoked.  Performed at [facility], [place] [address], [place], [place] [address]   SARS CORONAVIRUS 2 (TAT 6-24 HRS) Nasopharyngeal Nasopharyngeal Swab     Status: None   Collection Time: 03/29/20 10:56 PM   Specimen: Nasopharyngeal Swab  Result Value Ref Range Status   SARS Coronavirus 2 NEGATIVE NEGATIVE Final    Comment: (NOTE) SARS-CoV-2 target nucleic acids are NOT DETECTED.  The SARS-CoV-2 RNA is generally detectable in upper and lower respiratory specimens during the acute phase of infection. Negative results do not preclude SARS-CoV-2 infection, do not rule out co-infections with other pathogens, and should not be used as the sole basis for treatment or other patient management decisions. Negative results must be combined with clinical observations, patient history, and epidemiological information. The expected result is Negative.  Fact Sheet for Patients: SugarRoll.be  Fact Sheet for Healthcare Providers: https://www.woods-mathews.com/  This test is not yet approved or cleared by the [place] FDA and  has been authorized for detection and/or diagnosis of SARS-CoV-2 by FDA under an Emergency Use Authorization (EUA). This EUA will remain  in effect (meaning this test can be used) for the duration of the COVID-19 declaration under Se ction 564(b)(1) of the Act, 21 U.S.C. section 360bbb-3(b)(1), unless the authorization is terminated or revoked sooner.  Performed at [facility], [place] [address], [place], [place] [address]     Procedures and diagnostic studies:  CT ABDOMEN PELVIS W  CONTRAST  Result Date: 03/29/2020 CLINICAL DATA:  Right-sided abdominal pain EXAM: CT ABDOMEN AND PELVIS WITH CONTRAST TECHNIQUE: Multidetector CT imaging of the abdomen and pelvis was performed using the standard protocol following bolus administration of intravenous contrast.  CONTRAST:  125mL OMNIPAQUE IOHEXOL 300 MG/ML  SOLN COMPARISON:  CT 03/26/2020, 12/26/2019 01/12/2019 FINDINGS: Lower chest: Lung bases demonstrate minimal focus of atelectasis at the anterior left lung base. Normal cardiac size. Hepatobiliary: Hepatic steatosis. No calcified gallstone or biliary dilatation Pancreas: Unremarkable. No pancreatic ductal dilatation or surrounding inflammatory changes. Spleen: Normal in size without focal abnormality. Adrenals/Urinary Tract: Right adrenal gland is normal. Stable 1.9 cm left adrenal gland nodule. Kidneys show no hydronephrosis. The bladder is normal Stomach/Bowel: The stomach is nonenlarged. Focally dilated loop of contrast containing small bowel slightly to the right of midline within the anterior abdomen. This distended bowel loop extends into complex ventral hernia. The more distal small bowel is decompressed but does contain some contrast. There is no bowel wall thickening. Negative appendix. Postsurgical changes of sigmoid colon Vascular/Lymphatic: Nonaneurysmal aorta.  No suspicious nodes Reproductive: Status post hysterectomy. No adnexal masses. Other: Negative for free air or free fluid. Complex ventral hernia. Cranial portion contains fat. Midportion of hernia contains contrast opacified dilated small bowel. This loop re-enters the abdomen in then extends into a slightly more caudal ventral hernia that also contains mildly dilated small bowel. Small bowel distal to the inferior-most hernia sac is decompressed. Musculoskeletal: No acute or significant osseous findings. Thoracic stimulator leads to the approximate level of T7. IMPRESSION: 1. Short segment of focally dilated small bowel  within the anterior abdomen, slightly to the right of midline. This loop of bowel is associated with a complex ventral hernia that is described above. Suspect that there is a partial obstruction related to the ventral hernia as the more distal small bowel loops are decompressed but do contain some intraluminal contrast. 2. Hepatic steatosis 3. Stable left adrenal nodule Electronically Signed   By: Donavan Foil M.D.   On: 03/29/2020 20:52   DG Abd 2 Views  Result Date: 03/30/2020 CLINICAL DATA:  Abdominal pain EXAM: ABDOMEN - 2 VIEW COMPARISON:  March 29, 2020 FINDINGS: Nasogastric tube tip and side port are in the stomach. There is fairly diffuse stool throughout the colon. There is no appreciable bowel dilatation or air-fluid level to suggest bowel obstruction. No free air. Contrast is seen in the ascending colon. Stimulator device present on the right with leads extending into the lower thoracic region. Visualized lung bases clear. IMPRESSION: Diffuse stool throughout colon. No bowel obstruction or free air evident. Nasogastric tube tip and side port in stomach. Electronically Signed   By: Lowella Grip III M.D.   On: 03/30/2020 08:23               LOS: 1 day   Teonna Coonan  [facility]   Pager on [email or web address]. If 7PM-7AM, please contact night-coverage at [email or web address]     03/30/2020, 1:18 PM

## 2020-03-30 NOTE — Treatment Plan (Signed)
Pt complaining of pain and nausea since her NG tube was clamped. Pain medication administered and nausea meds at this time. Re connected to low intermittent suction.

## 2020-03-30 NOTE — Consult Note (Signed)
[facility] SURGICAL CONSULTATION NOTE (initial) - cpt: [id number]  HISTORY OF PRESENT ILLNESS (HPI):  50 y.o. female presented to [facility] ED yesterday for evaluation of abdominal pain. Patient reports around a 2-3 week history of less intense abdominal discomfort however in the last 24 hours this has become more intense and severe. Nothing seems to make this better or worse. She endorses associated nausea and non-bloody emesis with the pain as well as decrease in her normal bowel activity. No fever, chills, cough, CP, SOB, or urinary changes. She does have a complicated abdominal history and I have copied this from my previous consult:   She does have an extensive surgical history including abdominal hysterectomy, hernia repair, and most recently ventral hernia repair with excision of mesh and primary closure with Dr Sharen Counter [facility]) in November 2020. She also initially had ventral hernia repair with Dr Launa Flight [facility]) in 1245 which was complicated by need for wound debridement postoperatively. She was seen by Dr Dahlia Byes in September of 2020 but for her recurrence but referred back to [facility] for management. Her last admission here was in November of 2021 for the same. At that time, she was seen by Dr Lysle Pearl, her hernia was reducible, and she ultimately did well.   Work up in the ED yesterday was showed a mild hypokalemia to 3.3 but laboratory work up was otherwise reassuring. CT Abdomen/Pelvis again showed ventral hernia containing loops of small bowel, question partial obstruction, but there is contrast both proximal and distal to this.   Surgery is consulted by emergency medicine physician Dr. Hulan Saas, MD in this context for evaluation and management of partial SBO with ventral hernia.  PAST MEDICAL HISTORY (PMH):  Past Medical History:  Diagnosis Date  . Anemia   . Anginal pain ([place])   . Anxiety   . Arthritis   . Asthma   . Bilateral lower extremity edema   . Bipolar disorder ([place])   . CAD  (coronary artery disease) unk  . CHF (congestive heart failure) ([place])   . Chronic pain syndrome   . COPD (chronic obstructive pulmonary disease) (HCC)    emphysema  . DDD (degenerative disc disease), lumbar   . Depression unk  . Diabetes mellitus without complication ([place])   . Diabetes mellitus, type II ([place])   . Drug overdose 2013/05/06   after dad died  . Dysrhythmia    history of tachy arrythmias  . Fibromyalgia   . GERD (gastroesophageal reflux disease)   . Headache    chronic migraines  . Hyperlipidemia   . Hypertension   . Hypothyroidism   . Left leg pain 04/29/2014  . MI (myocardial infarction) ([place]) May 06, 2005  . Muscle ache 09/16/2014  . Osteoporosis   . Overactive bladder   . Pancreatitis unk  . Panic attacks   . PTSD (post-traumatic stress disorder)   . Reflex sympathetic dystrophy   . Renal cyst, left   . Renal insufficiency    ckd stage iii  . Restless legs syndrome   . Sleep apnea 06/2019   uses cpap  . Sleep apnea   . Stroke ([place]) 2008/05/06   TIA x 2. no residual deficits  . Suicide attempt [facility])    after father died.  . Thyroid disease    thyroid nodule  . TIA (transient ischemic attack) unk  . TIA (transient ischemic attack)      PAST SURGICAL HISTORY ([place]):  Past Surgical History:  Procedure Laterality Date  . ABDOMINAL HYSTERECTOMY    .  CARDIAC CATHETERIZATION    . HERNIA REPAIR  07/15/2017   [facility]  . lumbar facet     several  . prolapse rectum surgery N/A July 2016  . THORACIC LAMINECTOMY FOR SPINAL CORD STIMULATOR N/A 07/27/2019   Procedure: THORACIC SPINAL CORD STIMULATOR PLACEMENT WITH RIGHT FLANK PULSE GENERATOR;  Surgeon: [name], MD;  Location: [facility] ORS;  Service: Neurosurgery;  Laterality: N/A;  . TONSILLECTOMY       MEDICATIONS:  Prior to Admission medications   Medication Sig Start Date End Date Taking? Authorizing Provider  Albuterol Sulfate (PROAIR RESPICLICK) 825 (90 Base) MCG/ACT AEPB Inhale 2 puffs into the lungs every 6 (six) hours as  needed. 10/26/19  Yes [provider]  baclofen (LIORESAL) 20 MG tablet Take 20 mg by mouth 3 (three) times daily. 03/23/20  Yes [provider]  benztropine (COGENTIN) 0.5 MG tablet Take 1 tablet (0.5 mg total) by mouth 2 (two) times daily as needed for tremors. 01/18/20  Yes Ursula Alert, MD  busPIRone (BUSPAR) 15 MG tablet Take 1 tablet (15 mg total) by mouth 2 (two) times daily. 12/28/19  Yes Ursula Alert, MD  cyclobenzaprine (FLEXERIL) 10 MG tablet Take 10 mg by mouth 3 (three) times daily as needed. 12/17/19  Yes [provider]  desvenlafaxine (PRISTIQ) 100 MG 24 hr tablet Take 1 tablet (100 mg total) by mouth daily. 01/01/20  Yes Ursula Alert, MD  dicyclomine (BENTYL) 10 MG capsule Take 10 mg by mouth 4 (four) times daily -  before meals and at bedtime. 10/16/19  Yes [provider]  esomeprazole (NEXIUM) 40 MG capsule Take by mouth. 12/23/19 03/29/20 Yes [provider]  fluticasone (FLONASE) 50 MCG/ACT nasal spray Place 2 sprays into both nostrils daily. 06/03/19  Yes [provider]  glipiZIDE (GLUCOTROL XL) 10 MG 24 hr tablet Take 10 mg by mouth daily with breakfast. 11/17/19 11/16/20 Yes [provider]  levocetirizine (XYZAL) 5 MG tablet Take 5 mg by mouth every evening. 01/08/20  Yes [provider]  levothyroxine (SYNTHROID) 175 MCG tablet Take 175 mcg by mouth daily before breakfast. 11/10/19 11/09/20 Yes [provider]  meloxicam (MOBIC) 7.5 MG tablet Take 7.5 mg by mouth 2 (two) times daily. 02/20/20  Yes [provider]  ondansetron (ZOFRAN-ODT) 8 MG disintegrating tablet Take 8 mg by mouth every 8 (eight) hours as needed. 12/29/19  Yes [provider]  polyethylene glycol (MIRALAX) 17 g packet Take 17 g by mouth daily for 14 days. 03/26/20 04/09/20 Yes Christy Gentles, MD  potassium chloride SA (KLOR-CON) 20 MEQ tablet Take 20 mEq by mouth 2 (two) times daily. 12/25/19  Yes [provider]  pregabalin (LYRICA) 150 MG capsule Take 1 capsule (150 mg total) by mouth 3 (three) times daily. Do not take gabapentin (Neurontin) while taking this medication. 03/04/20 06/02/20 Yes Milinda Pointer, MD  promethazine (PHENERGAN) 25 MG tablet Take 25 mg by mouth every 6 (six) hours as needed. 03/26/20  Yes [provider]  QUEtiapine (SEROQUEL) 100 MG tablet Take 100 mg by mouth at bedtime. 11/25/19  Yes [provider]  QUEtiapine (SEROQUEL) 25 MG tablet Take 25 mg by mouth 2 (two) times daily. Takes one 25mg  with 100 mg at bedtime and 25 mg in the morning   Yes [provider]  rOPINIRole (REQUIP) 1 MG tablet Take 1 mg by mouth 3 (three) times daily. 12/10/19  Yes [provider]  LORazepam (ATIVAN) 0.5 MG tablet Take 1 tablet (0.5 mg  total) by mouth as directed. Take 1 tablet 3-4 times a week only for severe panic attacks 01/01/20   Ursula Alert, MD     ALLERGIES:  Allergies  Allergen Reactions  . Diazepam Hives and Nausea And Vomiting  . Ziprasidone Hcl Other (See Comments) and Itching    CONFUSED CONFUSED Other reaction(s): Other (See Comments) CONFUSED CONFUSED   . Azithromycin Hives  . Divalproex Sodium Hives  . Levofloxacin Hives and Rash    1/31: would like to retry since not taking valium  . Lisinopril     Medicine cause kidney failure  . Metronidazole Hives    1/31: would like to retry since she is no longer taking valium  . Sulfa Antibiotics Hives  . Valproic Acid Itching and Rash  . Cephalexin Hives  . Ciprofloxacin Hives  . Penicillins Hives    Has patient had a PCN reaction causing immediate rash, facial/tongue/throat swelling, SOB or lightheadedness with hypotension: No Has patient had a PCN reaction causing severe rash involving mucus membranes or skin necrosis: No Has patient had a PCN reaction that required hospitalization: No Has patient had a PCN reaction occurring within the last 10 years: No If all of  the above answers are "NO", then may proceed with Cephalosporin use.      SOCIAL HISTORY:  Social History   Socioeconomic History  . Marital status: Single    Spouse name: Not on file  . Number of children: 0  . Years of education: Not on file  . Highest education level: High school graduate  Occupational History    Comment: not employed  Tobacco Use  . Smoking status: Former Smoker    Packs/day: 0.50    Types: Cigarettes    Quit date: 09/01/2018    Years since quitting: 1.5  . Smokeless tobacco: Never Used  . Tobacco comment: Patient quit smoking 09/01/2018  Vaping Use  . Vaping Use: Never used  Substance and Sexual Activity  . Alcohol use: No    Alcohol/week: 0.0 standard drinks  . Drug use: No  . Sexual activity: Not Currently  Other Topics Concern  . Not on file  Social History Narrative   Patient lives with aunt and uncle.   Social Determinants of Health   Financial Resource Strain: Not on file  Food Insecurity: Not on file  Transportation Needs: Not on file  Physical Activity: Not on file  Stress: Not on file  Social Connections: Not on file  Intimate Partner Violence: Not on file     FAMILY HISTORY:  Family History  Problem Relation Age of Onset  . Diabetes Mellitus II Mother   . CAD Mother   . Sleep apnea Mother   . Osteoarthritis Mother   . Osteoporosis Mother   . Anxiety disorder Mother   . Depression Mother   . Bipolar disorder Mother   . Bipolar disorder Father   . Hypertension Father   . Depression Father   . Anxiety disorder Father   . Post-traumatic stress disorder Sister       REVIEW OF SYSTEMS:  Review of Systems  Constitutional: Negative for chills and fever.  Respiratory: Negative for cough and shortness of breath.   Cardiovascular: Negative for chest pain and palpitations.  Gastrointestinal: Positive for abdominal pain, nausea and vomiting. Negative for constipation and diarrhea.  Genitourinary: Negative for dysuria and urgency.   All other systems reviewed and are negative.   VITAL SIGNS:  Temp:  [97.9 F (36.6 C)-98.7 F (37.1 C)]  98.3 F (36.8 C) (02/16 0533) Pulse Rate:  [93-101] 93 (02/16 0533) Resp:  [16-17] 16 (02/16 0533) BP: (102-121)/(78-87) 102/78 (02/16 0533) SpO2:  [96 %-100 %] 96 % (02/16 0533)             INTAKE/OUTPUT:  No intake/output data recorded.  PHYSICAL EXAM:  Physical Exam Vitals and nursing note reviewed. Exam conducted with a chaperone present.  Constitutional:      General: She is not in acute distress.    Appearance: She is well-developed. She is obese. She is not ill-appearing.  HENT:     Head: Normocephalic and atraumatic.     Comments: NGT in place  Eyes:     General: No scleral icterus. Cardiovascular:     Rate and Rhythm: Normal rate and regular rhythm.     Heart sounds: Normal heart sounds. No murmur heard.   Pulmonary:     Effort: Pulmonary effort is normal. No respiratory distress.  Abdominal:     General: Abdomen is protuberant. A surgical scar is present. There is no distension.     Palpations: Abdomen is soft.     Tenderness: There is abdominal tenderness in the periumbilical area. There is no guarding or rebound.     Hernia: A hernia is present. Hernia is present in the ventral area.     Comments: Abdomen is obese, soft, non-distended, she is tender over midline ventral hernia to the right of her previous midline incision, this is reducible  Genitourinary:    Comments: Deferred Skin:    General: Skin is warm and dry.     Coloration: Skin is not jaundiced or pale.  Neurological:     General: No focal deficit present.     Mental Status: She is alert and oriented to person, place, and time.  Psychiatric:        Mood and Affect: Mood normal.        Behavior: Behavior normal.      Labs:  CBC Latest Ref Rng & Units 03/30/2020 03/29/2020 03/26/2020  WBC 4.0 - 10.5 K/uL 5.9 8.8 6.4  Hemoglobin 12.0 - 15.0 g/dL 10.5(L) 11.5(L) 11.9(L)  Hematocrit 36.0 -  46.0 % 33.1(L) 36.0 38.1  Platelets 150 - 400 K/uL 206 243 248   CMP Latest Ref Rng & Units 03/30/2020 03/29/2020 03/26/2020  Glucose 70 - 99 mg/dL 95 162(H) 105(H)  BUN 6 - 20 mg/dL 21(H) 28(H) 26(H)  Creatinine 0.44 - 1.00 mg/dL 0.78 1.00 1.22(H)  Sodium 135 - 145 mmol/L 141 139 140  Potassium 3.5 - 5.1 mmol/L 4.2 3.3(L) 3.9  Chloride 98 - 111 mmol/L 103 102 102  CO2 22 - 32 mmol/L 29 26 24   Calcium 8.9 - 10.3 mg/dL 8.7(L) 8.8(L) 9.1  Total Protein 6.5 - 8.1 g/dL - 7.7 7.2  Total Bilirubin 0.3 - 1.2 mg/dL - 0.7 0.6  Alkaline Phos 38 - 126 U/L - 127(H) 125  AST 15 - 41 U/L - 18 25  ALT 0 - 44 U/L - 18 20     Imaging studies:   CT Abdomen/Pelvis (03/29/2020) personally reviewed showing midline ventral hernia containing loops of bowel, there is some question of partial bowel obstruction but there does appear to be contrast proximal and distal to the hernia, she also does appear to have stool burden throughout the colon:  IMPRESSION: 1. Short segment of focally dilated small bowel within the anterior abdomen, slightly to the right of midline. This loop of bowel is associated with a complex ventral  hernia that is described above. Suspect that there is a partial obstruction related to the ventral hernia as the more distal small bowel loops are decompressed but do contain some intraluminal contrast. 2. Hepatic steatosis 3. Stable left adrenal nodule   Assessment/Plan: (ICD-10's: K56.609) 50 y.o. female with abdominal pain, nausea, and emesis with chronic, but reducible, midline ventral hernia and likely resultant partial SBO, complicated by multiple pertinent comorbidities.   - Recommend continued NGT decompression for now to LIS; if she has return of significant bowel function later in the day we can consider removal  - NPO + IVF resuscitation  - No need for emergent surgical intervention; hernia was reduced at bedside. She understands that her hernia repair will be best preformed at  tertiary center given the chronicity, complexity, and her underlying comorbid conditions. She has been seen at [facility] in the past but more recently has been following at [facility] for this.    - Monitor abdominal examination; on-going bowel function  - Pain control prn; antiemetics prn  - Mobilization as tolerated   - Further management per primary service; we will follow   All of the above findings and recommendations were discussed with the patient, and all of patient's questions were answered to her expressed satisfaction.  Thank you for the opportunity to participate in this patient's care.   -- Edison Simon, PA-C [facility] 03/30/2020, 6:59 AM [phone] M-F: 7am - 4pm

## 2020-03-31 LAB — HEMOGLOBIN A1C
Hgb A1c MFr Bld: 6.5 % — ABNORMAL HIGH (ref 4.8–5.6)
Mean Plasma Glucose: 140 mg/dL

## 2020-03-31 LAB — GLUCOSE, CAPILLARY
Glucose-Capillary: 106 mg/dL — ABNORMAL HIGH (ref 70–99)
Glucose-Capillary: 107 mg/dL — ABNORMAL HIGH (ref 70–99)
Glucose-Capillary: 95 mg/dL (ref 70–99)

## 2020-03-31 LAB — BASIC METABOLIC PANEL
Anion gap: 6 (ref 5–15)
BUN: 14 mg/dL (ref 6–20)
CO2: 29 mmol/L (ref 22–32)
Calcium: 8.6 mg/dL — ABNORMAL LOW (ref 8.9–10.3)
Chloride: 108 mmol/L (ref 98–111)
Creatinine, Ser: 0.74 mg/dL (ref 0.44–1.00)
GFR, Estimated: >60 mL/min (ref >60)
Glucose, Bld: 102 mg/dL — ABNORMAL HIGH (ref 70–99)
Potassium: 4.2 mmol/L (ref 3.5–5.1)
Sodium: 143 mmol/L (ref 135–145)

## 2020-03-31 LAB — MAGNESIUM: Magnesium: 2.5 mg/dL — ABNORMAL HIGH (ref 1.7–2.4)

## 2020-03-31 NOTE — Care Plan (Signed)
Pt given and applied abdominal binder at this time size xl.

## 2020-03-31 NOTE — TOC Initial Note (Addendum)
Transition of Care [facility]) - Initial/Assessment Note    Patient Details  Name: Marie Clarke MRN: [id number] Date of Birth: 1970/06/03  Transition of Care [facility] CM/SW Contact:    Candie Chroman, LCSW Phone Number: 03/31/2020, 10:46 AM  Clinical Narrative:  Readmission prevention screen complete. CSW met with patient. No supports at bedside. CSW introduced role and explained that discharge planning would be discussed. Patient lives home with her aunt and uncle. PCP is Salome Holmes, MD at [facility] in [place]. Patient sometimes drives to her appointments but gets rides when needed. Pharmacy is [facility]. No issues obtaining medications. Patient has a home health aide through [facility]. The aide comes out Monday-Friday for 2 1/2 hours and 3 hours on the weekends. Patient has a cane and walker at home. No further concerns. Patient has orders to discharge home today. CSW signing off.  Expected Discharge Plan: Home/Self Care Barriers to Discharge: No Barriers Identified   Patient Goals and CMS Choice        Expected Discharge Plan and Services Expected Discharge Plan: Home/Self Care     Post Acute Care Choice: Resumption of Svcs/PTA Provider Living arrangements for the past 2 months: Single Family Home Expected Discharge Date: 03/31/20                                    Prior Living Arrangements/Services Living arrangements for the past 2 months: Single Family Home Lives with:: Relatives (Aunt and uncle) Patient language and need for interpreter reviewed:: Yes Do you feel safe going back to the place where you live?: Yes      Need for Family Participation in Patient Care: Yes (Comment) Care giver support system in place?: Yes (comment) Current home services: DME,Homehealth aide Criminal Activity/Legal Involvement Pertinent to Current Situation/Hospitalization: No - Comment as needed  Activities of Daily Living Home Assistive Devices/Equipment:  Eyeglasses ADL Screening (condition at time of admission) Patient's cognitive ability adequate to safely complete daily activities?: Yes Is the patient deaf or have difficulty hearing?: No Does the patient have difficulty seeing, even when wearing glasses/contacts?: No Does the patient have difficulty concentrating, remembering, or making decisions?: No Patient able to express need for assistance with ADLs?: Yes Does the patient have difficulty dressing or bathing?: No Independently performs ADLs?: Yes (appropriate for developmental age) Does the patient have difficulty walking or climbing stairs?: No Weakness of Legs: None Weakness of Arms/Hands: None  Permission Sought/Granted                  Emotional Assessment Appearance:: Appears stated age Attitude/Demeanor/Rapport: Engaged,Gracious Affect (typically observed): Accepting,Appropriate,Calm,Pleasant Orientation: : Oriented to Self,Oriented to Place,Oriented to  Time,Oriented to Situation Alcohol / Substance Use: Not Applicable Psych Involvement: No (comment)  Admission diagnosis:  Hypokalemia [E87.6] Mild dehydration [E86.0] Partial small bowel obstruction ([place]) [K56.600] Patient Active Problem List   Diagnosis Date Noted  . Partial small bowel obstruction ([place]) 03/29/2020  . Hypokalemia   . SBO (small bowel obstruction) ([place]) 12/02/2019  . Chronic pain 12/02/2019  . Presence of neurostimulator (SCS) (June 2021) 10/06/2019  . Pain due to any device, implant or graft 09/21/2019  . Anxiety 09/10/2019  . Involuntary movements 09/10/2019  . Abnormal involuntary movement 08/18/2019  . Bipolar disorder, in full remission, most recent episode mixed ([place]) 08/11/2019  . Lumbosacral radiculopathy at L5 (Right) 04/09/2019  . Chronic migraine 03/12/2019  . Gout 01/21/2019  .  Noncompliance with treatment regimen 12/29/2018  . BMI 40.0-44.9, adult ([place]) 11/19/2018  . AKI (acute kidney injury) ([place]) 11/17/2018  . Postural  dizziness with presyncope 09/29/2018  . Bipolar I disorder, most recent episode mixed ([place]) 09/10/2018  . Panic attacks 09/10/2018  . Chronic lumbosacral L5-S1 IVD protrusion (Bilateral) 08/25/2018  . Lumbar lateral recess stenosis (L5-S1) (Bilateral) 08/25/2018  . Wound disruption 08/05/2018  . Osteoarthritis involving multiple joints 07/10/2018  . Long term current use of non-steroidal anti-inflammatories (NSAID) 07/10/2018  . NSAID induced gastritis 07/10/2018  . DDD (degenerative disc disease), lumbosacral 04/22/2018  . Disease related peripheral neuropathy 11/13/2017  . Gastroesophageal reflux disease without esophagitis 11/13/2017  . Occipital headache (Bilateral) 11/13/2017  . Cervicogenic headache (Bilateral) (L>R) 11/13/2017  . History of postoperative nausea 10/22/2017  . Chronic shoulder pain (5th area of Pain) (Bilateral) (L>R) 10/09/2017  . Ankle joint instability (Left) 10/09/2017  . Ankle sprain, sequela (Left) 10/09/2017  . History of psychiatric symptoms 10/09/2017  . Chronic pain syndrome 10/02/2017  . Spondylosis without myelopathy or radiculopathy, lumbosacral region 10/02/2017  . Chronic musculoskeletal pain 10/02/2017  . Elevated C-reactive protein (CRP) 09/10/2017  . Elevated sed rate 09/10/2017  . Chronic neck pain (4th area of Pain) (Bilateral) (L>R) 09/09/2017  . Pharmacologic therapy 09/09/2017  . Disorder of skeletal system 09/09/2017  . Problems influencing health status 09/09/2017  . Long term current use of opiate analgesic 09/09/2017  . Tobacco use disorder 07/16/2017  . Ventral hernia without obstruction or gangrene 06/25/2017  . Strain of extensor muscle, fascia and tendon of left index finger at wrist and hand level, initial encounter 05/16/2017  . Sepsis ([place]) 04/14/2017  . Osteopenia 04/03/2017  . Hypotension 09/17/2016  . Contusion of knee (Left) 10/12/2015  . Strain of knee (Left) 10/12/2015  . Incidental lung nodule 04/28/2015  . Chronic  low back pain (1ry area of Pain) (Bilateral) (L>R) 03/28/2015  . Chronic lower extremity pain (Referred) (2ry area of Pain) (Left) 03/28/2015  . Abdominal wound dehiscence 03/28/2015  . Encounter for pain management planning 03/28/2015  . Morbid obesity ([place]) 03/28/2015  . Abnormal CT scan, lumbar spine 03/28/2015  . Lumbar facet hypertrophy 03/28/2015  . Lumbar facet syndrome (Bilateral) (L>R) 03/28/2015  . Lumbar foraminal stenosis (Bilateral) (L5-S1) 03/28/2015  . Chronic ankle pain (3ry area of Pain) (Left) 03/28/2015  . Neurogenic pain 03/28/2015  . Neuropathic pain 03/28/2015  . Myofascial pain 03/28/2015  . History of suicide attempt 03/28/2015  . PTSD (post-traumatic stress disorder) 01/13/2015  . Abnormal gait 12/15/2014  . Congestive heart failure ([place]) 11/15/2014  . Abdominal wall abscess 09/20/2014  . 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 Diabetes mellitus, type 2 ([place]) 08/13/2014  . Bipolar affective disorder ([place]) 08/13/2014  . Type 2 diabetes mellitus ([place]) 08/13/2014  . Rectal prolapse 08/09/2014  . Rectal bleeding 08/09/2014  . Rectal bleed 08/09/2014  . Affective bipolar disorder ([place]) 08/05/2014  . Arteriosclerosis of coronary artery 08/05/2014  . CCF (congestive cardiac failure) ([place]) 08/05/2014  . Chronic kidney disease 08/05/2014  . 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 Apnea, sleep 08/05/2014  . Temporary cerebral vascular dysfunction 08/05/2014  . Polypharmacy 04/29/2014  . Other long term (current) drug therapy 04/29/2014  . 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 Chronic kidney disease, stage III (moderate) ([place]) 12/14/2013  . Controlled diabetes mellitus type II without complication ([place]) 96/28/3662  . Essential (primary) hypertension 12/03/2013  . Adult hypothyroidism 12/03/2013  . Controlled type 2 diabetes mellitus without complication ([place]) 94/76/5465   PCP:  Sharyne Peach, MD Pharmacy:   [facility] [address] [place] [address] Phone: [phone] Fax: [phone]     Social Determinants of Health (SDOH) Interventions    Readmission Risk Interventions Readmission Risk Prevention Plan 03/31/2020  Transportation Screening Complete  PCP or Specialist Appt within 3-5 Days Complete  HRI or [place] Complete  Social Work Consult for [place] Planning/Counseling Complete  Palliative Care Screening Not Applicable  Medication Review Press photographer) Complete  Some recent data might be hidden

## 2020-03-31 NOTE — Discharge Summary (Signed)
Physician Discharge Summary  Cordell Coke WRU:[id number] DOB: [birth year] DOA: 03/29/2020  PCP: Sharyne Peach, MD  Admit date: 03/29/2020 Discharge date: 03/31/2020  Discharge disposition: Home   Recommendations for Outpatient Follow-Up:   Follow-up with surgeon at [facility] on 04/08/2020 as scheduled Follow-up with PCP in 1 week   Discharge Diagnosis:   Active Problems:   Partial small bowel obstruction ([place])    Discharge Condition: Stable.  Diet recommendation:  Diet Order            DIET SOFT Room service appropriate? Yes; Fluid consistency: Thin  Diet effective now           Diet - low sodium heart healthy           Diet Carb Modified                   Code Status: Full Code     Hospital Course:   [name] is a 50 year old woman with multiple medical problems including but not limited to anxiety, bipolar disorder, asthma, CAD, CHF, chronic pain syndrome, COPD, type II DM, fibromyalgia, hypertension, hypothyroidism, midline ventral hernia.  She presented to the hospital with acute abdominal pain that was progressively worsening.  She later developed nausea and vomiting.  Work-up revealed partial small bowel obstruction nausea with ventral hernia.  She was managed conservatively with bowel rest, bowel decompression with NG tube, IV fluids, analgesics and antiemetics.  General surgery was consulted to assist with management.  She has had hypokalemia that was repleted.  Her condition has improved significantly.  All her symptoms have resolved and she has been able to move her bowels.  She is deemed stable for discharge to home.     Medical Consultants:    General surgeon   Discharge Exam:    Vitals:   03/30/20 1600 03/30/20 2344 03/31/20 0327 03/31/20 0721  BP: (!) 101/59 97/74 123/78 98/62  Pulse: 98 82 85 92  Resp: 18 18 18 16   Temp: 97.7 F (36.5 C) 98.2 F (36.8 C) 97.6 F (36.4 C) 98.1 F (36.7 C)  TempSrc:   Oral Oral  SpO2: 96%  95% 100% 100%     GEN: NAD SKIN: Warm and dry EYES: EOMI ENT: MMM CV: RRR PULM: CTA B ABD: soft, obese, NT, +BS CNS: AAO x 3, non focal EXT: No edema or tenderness   The results of significant diagnostics from this hospitalization (including imaging, microbiology, ancillary and laboratory) are listed below for reference.     Procedures and Diagnostic Studies:   CT ABDOMEN PELVIS W CONTRAST  Result Date: 03/29/2020 CLINICAL DATA:  Right-sided abdominal pain EXAM: CT ABDOMEN AND PELVIS WITH CONTRAST TECHNIQUE: Multidetector CT imaging of the abdomen and pelvis was performed using the standard protocol following bolus administration of intravenous contrast. CONTRAST:  181mL OMNIPAQUE IOHEXOL 300 MG/ML  SOLN COMPARISON:  CT 03/26/2020, 12/26/2019 01/12/2019 FINDINGS: Lower chest: Lung bases demonstrate minimal focus of atelectasis at the anterior left lung base. Normal cardiac size. Hepatobiliary: Hepatic steatosis. No calcified gallstone or biliary dilatation Pancreas: Unremarkable. No pancreatic ductal dilatation or surrounding inflammatory changes. Spleen: Normal in size without focal abnormality. Adrenals/Urinary Tract: Right adrenal gland is normal. Stable 1.9 cm left adrenal gland nodule. Kidneys show no hydronephrosis. The bladder is normal Stomach/Bowel: The stomach is nonenlarged. Focally dilated loop of contrast containing small bowel slightly to the right of midline within the anterior abdomen. This distended bowel loop extends into complex ventral hernia. The more distal  small bowel is decompressed but does contain some contrast. There is no bowel wall thickening. Negative appendix. Postsurgical changes of sigmoid colon Vascular/Lymphatic: Nonaneurysmal aorta.  No suspicious nodes Reproductive: Status post hysterectomy. No adnexal masses. Other: Negative for free air or free fluid. Complex ventral hernia. Cranial portion contains fat. Midportion of hernia contains contrast opacified  dilated small bowel. This loop re-enters the abdomen in then extends into a slightly more caudal ventral hernia that also contains mildly dilated small bowel. Small bowel distal to the inferior-most hernia sac is decompressed. Musculoskeletal: No acute or significant osseous findings. Thoracic stimulator leads to the approximate level of T7. IMPRESSION: 1. Short segment of focally dilated small bowel within the anterior abdomen, slightly to the right of midline. This loop of bowel is associated with a complex ventral hernia that is described above. Suspect that there is a partial obstruction related to the ventral hernia as the more distal small bowel loops are decompressed but do contain some intraluminal contrast. 2. Hepatic steatosis 3. Stable left adrenal nodule Electronically Signed   By: Donavan Foil M.D.   On: 03/29/2020 20:52   DG Abd 2 Views  Result Date: 03/30/2020 CLINICAL DATA:  Abdominal pain EXAM: ABDOMEN - 2 VIEW COMPARISON:  March 29, 2020 FINDINGS: Nasogastric tube tip and side port are in the stomach. There is fairly diffuse stool throughout the colon. There is no appreciable bowel dilatation or air-fluid level to suggest bowel obstruction. No free air. Contrast is seen in the ascending colon. Stimulator device present on the right with leads extending into the lower thoracic region. Visualized lung bases clear. IMPRESSION: Diffuse stool throughout colon. No bowel obstruction or free air evident. Nasogastric tube tip and side port in stomach. Electronically Signed   By: Lowella Grip III M.D.   On: 03/30/2020 08:23     Labs:   Basic Metabolic Panel: Recent Labs  Lab 03/26/20 1441 03/29/20 1719 03/30/20 0444 03/31/20 0354  NA 140 139 141 143  K 3.9 3.3* 4.2 4.2  CL 102 102 103 108  CO2 24 26 29 29   GLUCOSE 105* 162* 95 102*  BUN 26* 28* 21* 14  CREATININE 1.22* 1.00 0.78 0.74  CALCIUM 9.1 8.8* 8.7* 8.6*  MG  --  2.1  --  2.5*   GFR Estimated Creatinine Clearance:  99.9 mL/min (by C-G formula based on SCr of 0.74 mg/dL). Liver Function Tests: Recent Labs  Lab 03/26/20 1441 03/29/20 1719  AST 25 18  ALT 20 18  ALKPHOS 125 127*  BILITOT 0.6 0.7  PROT 7.2 7.7  ALBUMIN 3.8 4.1   Recent Labs  Lab 03/26/20 1441 03/29/20 1719  LIPASE 36 38   No results for input(s): AMMONIA in the last 168 hours. Coagulation profile No results for input(s): INR, PROTIME in the last 168 hours.  CBC: Recent Labs  Lab 03/26/20 1441 03/29/20 1719 03/30/20 0444  WBC 6.4 8.8 5.9  HGB 11.9* 11.5* 10.5*  HCT 38.1 36.0 33.1*  MCV 90.9 89.3 90.7  PLT 248 243 206   Cardiac Enzymes: No results for input(s): CKTOTAL, CKMB, CKMBINDEX, TROPONINI in the last 168 hours. BNP: Invalid input(s): POCBNP CBG: Recent Labs  Lab 03/30/20 1602 03/30/20 2023 03/30/20 2341 03/31/20 0404 03/31/20 0753  GLUCAP 100* 96 107* 106* 95   D-Dimer No results for input(s): DDIMER in the last 72 hours. Hgb A1c Recent Labs    03/29/20 1719  HGBA1C 6.5*   Lipid Profile No results for input(s): CHOL, HDL, LDLCALC,  TRIG, CHOLHDL, LDLDIRECT in the last 72 hours. Thyroid function studies Recent Labs    03/29/20 1719  TSH 7.677*   Anemia work up No results for input(s): VITAMINB12, FOLATE, FERRITIN, TIBC, IRON, RETICCTPCT in the last 72 hours. Microbiology Recent Results (from the past 240 hour(s))  Resp Panel by RT-PCR (Flu A&B, Covid) Nasopharyngeal Swab     Status: None   Collection Time: 03/26/20  4:56 PM   Specimen: Nasopharyngeal Swab; Nasopharyngeal(NP) swabs in vial transport medium  Result Value Ref Range Status   SARS Coronavirus 2 by RT PCR NEGATIVE NEGATIVE Final    Comment: (NOTE) SARS-CoV-2 target nucleic acids are NOT DETECTED.  The SARS-CoV-2 RNA is generally detectable in upper respiratory specimens during the acute phase of infection. The lowest concentration of SARS-CoV-2 viral copies this assay can detect is 138 copies/mL. A negative result does  not preclude SARS-Cov-2 infection and should not be used as the sole basis for treatment or other patient management decisions. A negative result may occur with  improper specimen collection/handling, submission of specimen other than nasopharyngeal swab, presence of viral mutation(s) within the areas targeted by this assay, and inadequate number of viral copies(<138 copies/mL). A negative result must be combined with clinical observations, patient history, and epidemiological information. The expected result is Negative.  Fact Sheet for Patients:  EntrepreneurPulse.com.au  Fact Sheet for Healthcare Providers:  IncredibleEmployment.be  This test is no t yet approved or cleared by the [place] FDA and  has been authorized for detection and/or diagnosis of SARS-CoV-2 by FDA under an Emergency Use Authorization (EUA). This EUA will remain  in effect (meaning this test can be used) for the duration of the COVID-19 declaration under Section 564(b)(1) of the Act, 21 U.S.C.section 360bbb-3(b)(1), unless the authorization is terminated  or revoked sooner.       Influenza A by PCR NEGATIVE NEGATIVE Final   Influenza B by PCR NEGATIVE NEGATIVE Final    Comment: (NOTE) The Xpert Xpress SARS-CoV-2/FLU/RSV plus assay is intended as an aid in the diagnosis of influenza from Nasopharyngeal swab specimens and should not be used as a sole basis for treatment. Nasal washings and aspirates are unacceptable for Xpert Xpress SARS-CoV-2/FLU/RSV testing.  Fact Sheet for Patients: EntrepreneurPulse.com.au  Fact Sheet for Healthcare Providers: IncredibleEmployment.be  This test is not yet approved or cleared by the [place] FDA and has been authorized for detection and/or diagnosis of SARS-CoV-2 by FDA under an Emergency Use Authorization (EUA). This EUA will remain in effect (meaning this test can be used) for the  duration of the COVID-19 declaration under Section 564(b)(1) of the Act, 21 U.S.C. section 360bbb-3(b)(1), unless the authorization is terminated or revoked.  Performed at [facility], [place] [address] [place], [place] [address]   SARS CORONAVIRUS 2 (TAT 6-24 HRS) Nasopharyngeal Nasopharyngeal Swab     Status: None   Collection Time: 03/29/20 10:56 PM   Specimen: Nasopharyngeal Swab  Result Value Ref Range Status   SARS Coronavirus 2 NEGATIVE NEGATIVE Final    Comment: (NOTE) SARS-CoV-2 target nucleic acids are NOT DETECTED.  The SARS-CoV-2 RNA is generally detectable in upper and lower respiratory specimens during the acute phase of infection. Negative results do not preclude SARS-CoV-2 infection, do not rule out co-infections with other pathogens, and should not be used as the sole basis for treatment or other patient management decisions. Negative results must be combined with clinical observations, patient history, and epidemiological information. The expected result is Negative.  Fact  Sheet for Patients: SugarRoll.be  Fact Sheet for Healthcare Providers: https://www.woods-mathews.com/  This test is not yet approved or cleared by the [place] FDA and  has been authorized for detection and/or diagnosis of SARS-CoV-2 by FDA under an Emergency Use Authorization (EUA). This EUA will remain  in effect (meaning this test can be used) for the duration of the COVID-19 declaration under Se ction 564(b)(1) of the Act, 21 U.S.C. section 360bbb-3(b)(1), unless the authorization is terminated or revoked sooner.  Performed at [facility], [place] [address], [place], [place] [address]      Discharge Instructions:   Discharge Instructions    Diet - low sodium heart healthy   Complete by: As directed    Diet Carb Modified   Complete by: As directed    Discharge instructions   Complete by: As directed    Follow up with  surgeon at [facility] as scheduled on Friday, 04/08/2020   Increase activity slowly   Complete by: As directed      Allergies as of 03/31/2020      Reactions   Diazepam Hives, Nausea And Vomiting   Ziprasidone Hcl Other (See Comments), Itching   CONFUSED CONFUSED Other reaction(s): Other (See Comments) CONFUSED CONFUSED   Azithromycin Hives   Divalproex Sodium Hives   Levofloxacin Hives, Rash   1/31: would like to retry since not taking valium   Lisinopril    Medicine cause kidney failure   Metronidazole Hives   1/31: would like to retry since she is no longer taking valium   Sulfa Antibiotics Hives   Valproic Acid Itching, Rash   Cephalexin Hives   Ciprofloxacin Hives   Penicillins Hives   Has patient had a PCN reaction causing immediate rash, facial/tongue/throat swelling, SOB or lightheadedness with hypotension: No Has patient had a PCN reaction causing severe rash involving mucus membranes or skin necrosis: No Has patient had a PCN reaction that required hospitalization: No Has patient had a PCN reaction occurring within the last 10 years: No If all of the above answers are "NO", then may proceed with Cephalosporin use.      Medication List    TAKE these medications   baclofen 20 MG tablet Commonly known as: LIORESAL Take 20 mg by mouth 3 (three) times daily.   benztropine 0.5 MG tablet Commonly known as: COGENTIN Take 1 tablet (0.5 mg total) by mouth 2 (two) times daily as needed for tremors.   busPIRone 15 MG tablet Commonly known as: BUSPAR Take 1 tablet (15 mg total) by mouth 2 (two) times daily.   cyclobenzaprine 10 MG tablet Commonly known as: FLEXERIL Take 10 mg by mouth 3 (three) times daily as needed.   desvenlafaxine 100 MG 24 hr tablet Commonly known as: Pristiq Take 1 tablet (100 mg total) by mouth daily.   dicyclomine 10 MG capsule Commonly known as: BENTYL Take 10 mg by mouth 4 (four) times daily -  before meals and at bedtime.    esomeprazole 40 MG capsule Commonly known as: NEXIUM Take by mouth.   fluticasone 50 MCG/ACT nasal spray Commonly known as: FLONASE Place 2 sprays into both nostrils daily.   glipiZIDE 10 MG 24 hr tablet Commonly known as: GLUCOTROL XL Take 10 mg by mouth daily with breakfast.   levocetirizine 5 MG tablet Commonly known as: XYZAL Take 5 mg by mouth every evening.   levothyroxine 175 MCG tablet Commonly known as: SYNTHROID Take 175 mcg by mouth daily before breakfast.  LORazepam 0.5 MG tablet Commonly known as: ATIVAN Take 1 tablet (0.5 mg total) by mouth as directed. Take 1 tablet 3-4 times a week only for severe panic attacks   meloxicam 7.5 MG tablet Commonly known as: MOBIC Take 7.5 mg by mouth 2 (two) times daily.   ondansetron 8 MG disintegrating tablet Commonly known as: ZOFRAN-ODT Take 8 mg by mouth every 8 (eight) hours as needed.   polyethylene glycol 17 g packet Commonly known as: MiraLax Take 17 g by mouth daily for 14 days.   potassium chloride SA 20 MEQ tablet Commonly known as: KLOR-CON Take 20 mEq by mouth 2 (two) times daily.   pregabalin 150 MG capsule Commonly known as: Lyrica Take 1 capsule (150 mg total) by mouth 3 (three) times daily. Do not take gabapentin (Neurontin) while taking this medication.   ProAir RespiClick 544 (90 Base) MCG/ACT Aepb Generic drug: Albuterol Sulfate Inhale 2 puffs into the lungs every 6 (six) hours as needed.   promethazine 25 MG tablet Commonly known as: PHENERGAN Take 25 mg by mouth every 6 (six) hours as needed.   QUEtiapine 25 MG tablet Commonly known as: SEROQUEL Take 25 mg by mouth 2 (two) times daily. Takes one 25mg  with 100 mg at bedtime and 25 mg in the morning   QUEtiapine 100 MG tablet Commonly known as: SEROQUEL Take 100 mg by mouth at bedtime.   rOPINIRole 1 MG tablet Commonly known as: REQUIP Take 1 mg by mouth 3 (three) times daily.         Time coordinating discharge: 32  minutes  Signed:  Anthonio Mizzell  [facility] 03/31/2020, 10:43 AM   Pager on [email or web address]. If 7PM-7AM, please contact night-coverage at [email or web address]

## 2020-03-31 NOTE — Care Plan (Signed)
Pt asking for Nexium to be called into her pharmacy, [place], MD [facility] made aware.

## 2020-03-31 NOTE — Care Plan (Signed)
Washed patients hair, Pt apologized for yelling at this writer this am.

## 2020-03-31 NOTE — Care Plan (Signed)
Pt called nurse at this time and demanded to see nurse and started to yell at this writer about her cup of coffee, PA Delena Bali did not change diet until 0843. This writer was in a patients room cleaning ponds of urine out of the bed and floor for 47 minutes and had to give 2 baths of the same patient. This Probation officer was not informed that patient needed to go to the bathroom, Network engineer called and I informed her that I was in a room cleaning a patient and couldn't leave this patient covered in urine. Was never told that patient had to go to the bathroom as she could have went since she is ad lib with her IV pole since it is on wheels and NG tube was clamped from medication administration this am and had to wait 1 hour to re-clamp after administration for absorption into system.   As soon as this Probation officer was available, I went into the room at 901 am and attempted to explain to the patient that I was busy in a room and got here as soon as I was able. She started to yell at me and said that I had an attitude since I was trying to explain to her why I couldn't answer the phone as I was covered in urine. She said she didn't care, she was a patient too. I then explained to her that sometimes as a nurse we have to prioritize and I cannot leave a patient like that as her diet was not switched til 843 am and giving food/drinks prior to that was a violation of orders. She said she didn't care and started to yell at this time Probation officer.    This Probation officer did bring her a coffee with cream and sugar after speaking to PA about diet change.  I also tried to educate her to take her diet slow as she demanded a diet yesterday and ended up hurting and being in pain from the CLD ordered. MD [place] messaged writer last night and made her NPO from episode and had to reconnect her to suction.   I informed charge nurse and Radio broadcast assistant of episode at this time. Humberto Leep, RN-MSN.

## 2020-03-31 NOTE — Care Plan (Signed)
Extra large abdominal binder ordered and educated to patient.

## 2020-03-31 NOTE — Progress Notes (Signed)
Pt given AVS at this time, no questions asked. No Iv to remove at this time.

## 2020-03-31 NOTE — Care Plan (Signed)
Pt removed her IV and called writer to inform her that it fell out.

## 2020-03-31 NOTE — Progress Notes (Signed)
[facility] SURGICAL PROGRESS NOTE (cpt [phone])  Hospital Day(s): 2.   Interval History: Patient seen and examined, no acute events or new complaints overnight. Patient reports she is doing much better this morning and wants to go home. She denied any abdominal pain, nausea, emesis. She had her NGT clamped for a few hours this morning and residuals were <10 ccs. She is passing flatus and having multiple BMs. She reports being hungry  Review of Systems:  Constitutional: denies fever, chills  HEENT: denies cough or congestion  Respiratory: denies any shortness of breath  Cardiovascular: denies chest pain or palpitations  Gastrointestinal: denies abdominal pain, N/V, or diarrhea/and bowel function as per interval history Genitourinary: denies burning with urination or urinary frequency  Vital signs in last 24 hours: [min-max] current  Temp:  [97.6 F (36.4 C)-98.6 F (37 C)] 98.1 F (36.7 C) (02/17 0721) Pulse Rate:  [81-98] 92 (02/17 0721) Resp:  [16-18] 16 (02/17 0721) BP: (97-123)/(59-78) 98/62 (02/17 0721) SpO2:  [95 %-100 %] 100 % (02/17 0721)             Intake/Output last 2 shifts:  02/16 0701 - 02/17 0700 In: 1930.6 [P.O.:840; I.V.:910.6; NG/GT:180] Out: 1551 [Urine:900; Emesis/NG output:650; Stool:1]   Physical Exam:  Constitutional: alert, cooperative and no distress  HENT: normocephalic without obvious abnormality, NGT removed Eyes: PERRL, EOM's grossly intact and symmetric  Respiratory: breathing non-labored at rest  Cardiovascular: regular rate and sinus rhythm  Gastrointestinal: Soft, non-tender, and non-distended, no rebound/guarding. Small ventral hernia appreciable, reducible Musculoskeletal: no edema or wounds, motor and sensation grossly intact, NT    Labs:  CBC Latest Ref Rng & Units 03/30/2020 03/29/2020 03/26/2020  WBC 4.0 - 10.5 K/uL 5.9 8.8 6.4  Hemoglobin 12.0 - 15.0 g/dL 10.5(L) 11.5(L) 11.9(L)  Hematocrit 36.0 - 46.0 % 33.1(L) 36.0 38.1   Platelets 150 - 400 K/uL 206 243 248   CMP Latest Ref Rng & Units 03/31/2020 03/30/2020 03/29/2020  Glucose 70 - 99 mg/dL 102(H) 95 162(H)  BUN 6 - 20 mg/dL 14 21(H) 28(H)  Creatinine 0.44 - 1.00 mg/dL 0.74 0.78 1.00  Sodium 135 - 145 mmol/L 143 141 139  Potassium 3.5 - 5.1 mmol/L 4.2 4.2 3.3(L)  Chloride 98 - 111 mmol/L 108 103 102  CO2 22 - 32 mmol/L 29 29 26   Calcium 8.9 - 10.3 mg/dL 8.6(L) 8.7(L) 8.8(L)  Total Protein 6.5 - 8.1 g/dL - - 7.7  Total Bilirubin 0.3 - 1.2 mg/dL - - 0.7  Alkaline Phos 38 - 126 U/L - - 127(H)  AST 15 - 41 U/L - - 18  ALT 0 - 44 U/L - - 18     Imaging studies: No new pertinent imaging studies   Assessment/Plan: (ICD-10's: K87.609) 50 y.o. female with clinically resolved partial SBO secondary to chronic, reducible, ventral hernia, complicated by multiple pertinent comorbidities.              - Removed NGT at bedside  - Will initiate soft diet; discontinue IVF  - Ordered abdominal binder for home             - Monitor abdominal examination; on-going bowel function             - Pain control prn; antiemetics prn             - Mobilization as tolerated              - Further management per primary service   -  Discharge Planning: patient is anxious to go home,, pending diet advancement. She has follow up with surgery at [facility] next Friday  All of the above findings and recommendations were discussed with the patient, and all of patient's questions were answered to her expressed satisfaction.  -- Edison Simon, PA-C [facility] 03/31/2020, 7:32 AM [phone] M-F: 7am - 4pm

## 2020-04-02 ENCOUNTER — Emergency Department: Payer: Medicare Other

## 2020-04-02 ENCOUNTER — Other Ambulatory Visit: Payer: Self-pay

## 2020-04-02 ENCOUNTER — Emergency Department
Admission: EM | Admit: 2020-04-02 | Discharge: 2020-04-03 | Disposition: A | Discharge status: Home / Self Care | Payer: Medicare Other | Attending: Emergency Medicine | Admitting: Emergency Medicine

## 2020-04-02 DIAGNOSIS — J45909 Unspecified asthma, uncomplicated: Secondary | ICD-10-CM | POA: Diagnosis not present

## 2020-04-02 DIAGNOSIS — R42 Dizziness and giddiness: Secondary | ICD-10-CM | POA: Diagnosis present

## 2020-04-02 DIAGNOSIS — Z6841 Body Mass Index (BMI) 40.0 and over, adult: Secondary | ICD-10-CM | POA: Insufficient documentation

## 2020-04-02 DIAGNOSIS — E1122 Type 2 diabetes mellitus with diabetic chronic kidney disease: Secondary | ICD-10-CM | POA: Insufficient documentation

## 2020-04-02 DIAGNOSIS — R110 Nausea: Secondary | ICD-10-CM | POA: Diagnosis not present

## 2020-04-02 DIAGNOSIS — I5089 Other heart failure: Secondary | ICD-10-CM | POA: Diagnosis not present

## 2020-04-02 DIAGNOSIS — I130 Hypertensive heart and chronic kidney disease with heart failure and stage 1 through stage 4 chronic kidney disease, or unspecified chronic kidney disease: Secondary | ICD-10-CM | POA: Insufficient documentation

## 2020-04-02 DIAGNOSIS — R109 Unspecified abdominal pain: Secondary | ICD-10-CM | POA: Diagnosis not present

## 2020-04-02 DIAGNOSIS — I2510 Atherosclerotic heart disease of native coronary artery without angina pectoris: Secondary | ICD-10-CM | POA: Insufficient documentation

## 2020-04-02 DIAGNOSIS — N183 Chronic kidney disease, stage 3 unspecified: Secondary | ICD-10-CM | POA: Diagnosis not present

## 2020-04-02 DIAGNOSIS — I509 Heart failure, unspecified: Secondary | ICD-10-CM

## 2020-04-02 DIAGNOSIS — Z79899 Other long term (current) drug therapy: Secondary | ICD-10-CM | POA: Insufficient documentation

## 2020-04-02 DIAGNOSIS — Z7984 Long term (current) use of oral hypoglycemic drugs: Secondary | ICD-10-CM | POA: Diagnosis not present

## 2020-04-02 DIAGNOSIS — E119 Type 2 diabetes mellitus without complications: Secondary | ICD-10-CM

## 2020-04-02 DIAGNOSIS — E039 Hypothyroidism, unspecified: Secondary | ICD-10-CM | POA: Diagnosis not present

## 2020-04-02 DIAGNOSIS — J449 Chronic obstructive pulmonary disease, unspecified: Secondary | ICD-10-CM | POA: Diagnosis not present

## 2020-04-02 DIAGNOSIS — E860 Dehydration: Secondary | ICD-10-CM

## 2020-04-02 DIAGNOSIS — Z87891 Personal history of nicotine dependence: Secondary | ICD-10-CM | POA: Insufficient documentation

## 2020-04-02 LAB — CBC
HCT: 33.6 % — ABNORMAL LOW (ref 36.0–46.0)
Hemoglobin: 10.7 g/dL — ABNORMAL LOW (ref 12.0–15.0)
MCH: 28.7 pg (ref 26.0–34.0)
MCHC: 31.8 g/dL (ref 30.0–36.0)
MCV: 90.1 fL (ref 80.0–100.0)
Platelets: 237 10*3/uL (ref 150–400)
RBC: 3.73 MIL/uL — ABNORMAL LOW (ref 3.87–5.11)
RDW: 14.6 % (ref 11.5–15.5)
WBC: 8.6 10*3/uL (ref 4.0–10.5)
nRBC: 0 % (ref 0.0–0.2)

## 2020-04-02 LAB — COMPREHENSIVE METABOLIC PANEL
ALT: 18 U/L (ref 0–44)
AST: 22 U/L (ref 15–41)
Albumin: 4 g/dL (ref 3.5–5.0)
Alkaline Phosphatase: 108 U/L (ref 38–126)
Anion gap: 11 (ref 5–15)
BUN: 16 mg/dL (ref 6–20)
CO2: 29 mmol/L (ref 22–32)
Calcium: 8.9 mg/dL (ref 8.9–10.3)
Chloride: 98 mmol/L (ref 98–111)
Creatinine, Ser: 1.11 mg/dL — ABNORMAL HIGH (ref 0.44–1.00)
GFR, Estimated: >60 mL/min (ref >60)
Glucose, Bld: 107 mg/dL — ABNORMAL HIGH (ref 70–99)
Potassium: 3.6 mmol/L (ref 3.5–5.1)
Sodium: 138 mmol/L (ref 135–145)
Total Bilirubin: 0.6 mg/dL (ref 0.3–1.2)
Total Protein: 7.5 g/dL (ref 6.5–8.1)

## 2020-04-02 MED ORDER — SODIUM CHLORIDE 0.9 % IV BOLUS
1000.0000 mL | Freq: Once | INTRAVENOUS | Status: AC
  Administered 2020-04-02: 1000 mL via INTRAVENOUS

## 2020-04-02 NOTE — ED Triage Notes (Signed)
Pt comes via EMS from home . With a C/C of lower abdominal pain, N/V , Discharged Thursday  For a partial blockage.

## 2020-04-02 NOTE — ED Notes (Signed)
First Nurse Note: Pt to ED via EMS from home for abd pain and nausea. Pt was DC on Thursday. VSS per EMS.

## 2020-04-02 NOTE — ED Notes (Signed)
Family updated as to patient's status.

## 2020-04-02 NOTE — ED Provider Notes (Incomplete)
[facility] Emergency Department Provider Note  ____________________________________________  Time seen: Approximately 11:59 PM  I have reviewed the triage vital signs and the nursing notes.   HISTORY  Chief Complaint Abdominal Pain (Onset; ongoing since yesterday , n/v , blurry vision )    HPI Marie Clarke is a 50 y.o. female with a past history of anxiety, CAD CHF COPD diabetes depression  fibromyalgia who is brought to the ED today due to abdominal pain and nausea, also reportedly was feeling lightheaded earlier today.  No vomiting, no diarrhea or constipation.  Was recently in the hospital for partial SBO, discharged 2 days ago.  No fever.  Reports compliance with her medications and eating normally.     Past Medical History:  Diagnosis Date  . Anemia   . Anginal pain ([place])   . Anxiety   . Arthritis   . Asthma   . Bilateral lower extremity edema   . Bipolar disorder ([place])   . CAD (coronary artery disease) unk  . CHF (congestive heart failure) ([place])   . Chronic pain syndrome   . COPD (chronic obstructive pulmonary disease) (HCC)    emphysema  . DDD (degenerative disc disease), lumbar   . Depression unk  . Diabetes mellitus without complication ([place])   . Diabetes mellitus, type II ([place])   . Drug overdose 27-May-2013   after dad died  . Dysrhythmia    history of tachy arrythmias  . Fibromyalgia   . GERD (gastroesophageal reflux disease)   . Headache    chronic migraines  . Hyperlipidemia   . Hypertension   . Hypothyroidism   . Left leg pain 04/29/2014  . MI (myocardial infarction) ([place]) 05-27-05  . Muscle ache 09/16/2014  . Osteoporosis   . Overactive bladder   . Pancreatitis unk  . Panic attacks   . PTSD (post-traumatic stress disorder)   . Reflex sympathetic dystrophy   . Renal cyst, left   . Renal insufficiency    ckd stage iii  . Restless legs syndrome   . Sleep apnea 06/2019   uses cpap  . Sleep apnea   . Stroke ([place]) 05-27-08   TIA x 2. no  residual deficits  . Suicide attempt [facility])    after father died.  . Thyroid disease    thyroid nodule  . TIA (transient ischemic attack) unk  . TIA (transient ischemic attack)      Patient Active Problem List   Diagnosis Date Noted  . Partial small bowel obstruction ([place]) 03/29/2020  . Hypokalemia   . SBO (small bowel obstruction) ([place]) 12/02/2019  . Chronic pain 12/02/2019  . Presence of neurostimulator (SCS) (June 2021) 10/06/2019  . Pain due to any device, implant or graft 09/21/2019  . Anxiety 09/10/2019  . Involuntary movements 09/10/2019  . Abnormal involuntary movement 08/18/2019  . Bipolar disorder, in full remission, most recent episode mixed ([place]) 08/11/2019  . Lumbosacral radiculopathy at L5 (Right) 04/09/2019  . Chronic migraine 03/12/2019  . Gout 01/21/2019  . Noncompliance with treatment regimen 12/29/2018  . BMI 40.0-44.9, adult ([place]) 11/19/2018  . AKI (acute kidney injury) ([place]) 11/17/2018  . Postural dizziness with presyncope 09/29/2018  . Bipolar I disorder, most recent episode mixed ([place]) 09/10/2018  . Panic attacks 09/10/2018  . Chronic lumbosacral L5-S1 IVD protrusion (Bilateral) 08/25/2018  . Lumbar lateral recess stenosis (L5-S1) (Bilateral) 08/25/2018  . Wound disruption 08/05/2018  . Osteoarthritis involving multiple joints 07/10/2018  . Long term current use of non-steroidal anti-inflammatories (NSAID)  07/10/2018  . NSAID induced gastritis 07/10/2018  . DDD (degenerative disc disease), lumbosacral 04/22/2018  . Disease related peripheral neuropathy 11/13/2017  . Gastroesophageal reflux disease without esophagitis 11/13/2017  . Occipital headache (Bilateral) 11/13/2017  . Cervicogenic headache (Bilateral) (L>R) 11/13/2017  . History of postoperative nausea 10/22/2017  . Chronic shoulder pain (5th area of Pain) (Bilateral) (L>R) 10/09/2017  . Ankle joint instability (Left) 10/09/2017  . Ankle sprain, sequela (Left) 10/09/2017  . History of  psychiatric symptoms 10/09/2017  . Chronic pain syndrome 10/02/2017  . Spondylosis without myelopathy or radiculopathy, lumbosacral region 10/02/2017  . Chronic musculoskeletal pain 10/02/2017  . Elevated C-reactive protein (CRP) 09/10/2017  . Elevated sed rate 09/10/2017  . Chronic neck pain (4th area of Pain) (Bilateral) (L>R) 09/09/2017  . Pharmacologic therapy 09/09/2017  . Disorder of skeletal system 09/09/2017  . Problems influencing health status 09/09/2017  . Long term current use of opiate analgesic 09/09/2017  . Tobacco use disorder 07/16/2017  . Ventral hernia without obstruction or gangrene 06/25/2017  . Strain of extensor muscle, fascia and tendon of left index finger at wrist and hand level, initial encounter 05/16/2017  . Sepsis ([place]) 04/14/2017  . Osteopenia 04/03/2017  . Hypotension 09/17/2016  . Contusion of knee (Left) 10/12/2015  . Strain of knee (Left) 10/12/2015  . Incidental lung nodule 04/28/2015  . Chronic low back pain (1ry area of Pain) (Bilateral) (L>R) 03/28/2015  . Chronic lower extremity pain (Referred) (2ry area of Pain) (Left) 03/28/2015  . Abdominal wound dehiscence 03/28/2015  . Encounter for pain management planning 03/28/2015  . Morbid obesity ([place]) 03/28/2015  . Abnormal CT scan, lumbar spine 03/28/2015  . Lumbar facet hypertrophy 03/28/2015  . Lumbar facet syndrome (Bilateral) (L>R) 03/28/2015  . Lumbar foraminal stenosis (Bilateral) (L5-S1) 03/28/2015  . Chronic ankle pain (3ry area of Pain) (Left) 03/28/2015  . Neurogenic pain 03/28/2015  . Neuropathic pain 03/28/2015  . Myofascial pain 03/28/2015  . History of suicide attempt 03/28/2015  . PTSD (post-traumatic stress disorder) 01/13/2015  . Abnormal gait 12/15/2014  . Congestive heart failure ([place]) 11/15/2014  . Abdominal wall abscess 09/20/2014  . 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 Diabetes mellitus, type 2 ([place]) 08/13/2014  . Bipolar affective disorder ([place]) 08/13/2014  . Type 2  diabetes mellitus ([place]) 08/13/2014  . Rectal prolapse 08/09/2014  . Rectal bleeding 08/09/2014  . Rectal bleed 08/09/2014  . Affective bipolar disorder ([place]) 08/05/2014  . Arteriosclerosis of coronary artery 08/05/2014  . CCF (congestive cardiac failure) ([place]) 08/05/2014  . Chronic kidney disease 08/05/2014  . 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 Apnea, sleep 08/05/2014  . Temporary cerebral vascular dysfunction 08/05/2014  . Polypharmacy 04/29/2014  . Other long term (current) drug therapy 04/29/2014  . 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 Chronic kidney disease, stage III (moderate) ([place]) 12/14/2013  . Controlled diabetes mellitus type II without complication ([place]) 78/29/5621  . Essential (primary) hypertension 12/03/2013  . Adult hypothyroidism 12/03/2013  . Controlled type 2 diabetes mellitus without complication ([place]) 30/86/5784     Past Surgical History:  Procedure Laterality Date  . ABDOMINAL HYSTERECTOMY    . CARDIAC CATHETERIZATION    . HERNIA REPAIR  07/15/2017   [facility]  . lumbar facet     several  . prolapse rectum surgery N/A July 2016  . THORACIC LAMINECTOMY FOR SPINAL CORD STIMULATOR N/A 07/27/2019   Procedure: THORACIC SPINAL CORD STIMULATOR PLACEMENT WITH RIGHT FLANK PULSE GENERATOR;  Surgeon: [name], MD;  Location: [facility] ORS;  Service: Neurosurgery;  Laterality:  N/A;  . TONSILLECTOMY       Prior to Admission medications   Medication Sig Start Date End Date Taking? Authorizing Provider  Albuterol Sulfate (PROAIR RESPICLICK) 774 (90 Base) MCG/ACT AEPB Inhale 2 puffs into the lungs every 6 (six) hours as needed. 10/26/19   [provider]  baclofen (LIORESAL) 20 MG tablet Take 20 mg by mouth 3 (three) times daily. 03/23/20   [provider]  benztropine (COGENTIN) 0.5 MG tablet Take 1 tablet (0.5 mg total) by mouth 2 (two) times daily as needed for tremors. 01/18/20   Ursula Alert, MD  busPIRone (BUSPAR) 15 MG tablet Take 1 tablet (15 mg  total) by mouth 2 (two) times daily. 12/28/19   Ursula Alert, MD  cyclobenzaprine (FLEXERIL) 10 MG tablet Take 10 mg by mouth 3 (three) times daily as needed. 12/17/19   [provider]  desvenlafaxine (PRISTIQ) 100 MG 24 hr tablet Take 1 tablet (100 mg total) by mouth daily. 01/01/20   Ursula Alert, MD  dicyclomine (BENTYL) 10 MG capsule Take 10 mg by mouth 4 (four) times daily -  before meals and at bedtime. 10/16/19   [provider]  esomeprazole (NEXIUM) 40 MG capsule Take by mouth. 12/23/19 03/29/20  [provider]  fluticasone (FLONASE) 50 MCG/ACT nasal spray Place 2 sprays into both nostrils daily. 06/03/19   [provider]  glipiZIDE (GLUCOTROL XL) 10 MG 24 hr tablet Take 10 mg by mouth daily with breakfast. 11/17/19 11/16/20  [provider]  levocetirizine (XYZAL) 5 MG tablet Take 5 mg by mouth every evening. 01/08/20   [provider]  levothyroxine (SYNTHROID) 175 MCG tablet Take 175 mcg by mouth daily before breakfast. 11/10/19 11/09/20  [provider]  LORazepam (ATIVAN) 0.5 MG tablet Take 1 tablet (0.5 mg total) by mouth as directed. Take 1 tablet 3-4 times a week only for severe panic attacks 01/01/20   Ursula Alert, MD  meloxicam (MOBIC) 7.5 MG tablet Take 7.5 mg by mouth 2 (two) times daily. 02/20/20   [provider]  ondansetron (ZOFRAN-ODT) 8 MG disintegrating tablet Take 8 mg by mouth every 8 (eight) hours as needed. 12/29/19   [provider]  polyethylene glycol (MIRALAX) 17 g packet Take 17 g by mouth daily for 14 days. 03/26/20 04/09/20  Christy Gentles, MD  potassium chloride SA (KLOR-CON) 20 MEQ tablet Take 20 mEq by mouth 2 (two) times daily. 12/25/19   [provider]  pregabalin (LYRICA) 150 MG capsule Take 1 capsule (150 mg total) by mouth 3 (three) times daily. Do not take gabapentin (Neurontin) while taking this medication. 03/04/20 06/02/20  Milinda Pointer, MD  promethazine  (PHENERGAN) 25 MG tablet Take 25 mg by mouth every 6 (six) hours as needed. 03/26/20   [provider]  QUEtiapine (SEROQUEL) 100 MG tablet Take 100 mg by mouth at bedtime. 11/25/19   [provider]  QUEtiapine (SEROQUEL) 25 MG tablet Take 25 mg by mouth 2 (two) times daily. Takes one 25mg  with 100 mg at bedtime and 25 mg in the morning    [provider]  rOPINIRole (REQUIP) 1 MG tablet Take 1 mg by mouth 3 (three) times daily. 12/10/19   [provider]     Allergies Diazepam, Ziprasidone hcl, Azithromycin, Divalproex sodium, Levofloxacin, Lisinopril, Metronidazole, Sulfa antibiotics, Valproic acid, Cephalexin, Ciprofloxacin, and Penicillins   Family History  Problem Relation Age of Onset  . Diabetes Mellitus II Mother   . CAD Mother   .  Sleep apnea Mother   . Osteoarthritis Mother   . Osteoporosis Mother   . Anxiety disorder Mother   . Depression Mother   . Bipolar disorder Mother   . Bipolar disorder Father   . Hypertension Father   . Depression Father   . Anxiety disorder Father   . Post-traumatic stress disorder Sister     Social History Social History   Tobacco Use  . Smoking status: Former Smoker    Packs/day: 0.50    Types: Cigarettes    Quit date: 09/01/2018    Years since quitting: 1.5  . Smokeless tobacco: Never Used  . Tobacco comment: Patient quit smoking 09/01/2018  Vaping Use  . Vaping Use: Never used  Substance Use Topics  . Alcohol use: No    Alcohol/week: 0.0 standard drinks  . Drug use: No    Review of Systems *** Constitutional:   No fever or chills.  ENT:   No sore throat. No rhinorrhea. Cardiovascular:   No chest pain or syncope. Respiratory:   No dyspnea or cough. Gastrointestinal:   Negative for abdominal pain, vomiting and diarrhea.  Musculoskeletal:   Negative for focal pain or swelling All other systems reviewed and are negative except as documented above in ROS and  HPI.  ____________________________________________   PHYSICAL EXAM:  VITAL SIGNS: ED Triage Vitals  Enc Vitals Group     BP 04/02/20 1907 (!) 114/95     Pulse Rate 04/02/20 1907 96     Resp 04/02/20 1908 18     Temp 04/02/20 1907 97.9 F (36.6 C)     Temp Source 04/02/20 1907 Oral     SpO2 04/02/20 1907 100 %     Weight 04/02/20 1913 225 lb (102.1 kg)     Height 04/02/20 1913 5\' 4"  (1.626 m)     Head Circumference --      Peak Flow --      Pain Score 04/02/20 1913 10     Pain Loc --      Pain Edu? --      Excl. in [place]? --     Vital signs reviewed, nursing assessments reviewed.  *** Constitutional:   Alert and oriented. Non-toxic appearance. Eyes:   Conjunctivae are normal. EOMI. PERRL. ENT      Head:   Normocephalic and atraumatic.      Nose:   Wearing a mask.      Mouth/Throat:   Wearing a mask.      Neck:   No meningismus. Full ROM. Hematological/Lymphatic/Immunilogical:   No cervical lymphadenopathy. Cardiovascular:   RRR. Symmetric bilateral radial and DP pulses.  No murmurs. Cap refill less than 2 seconds. Respiratory:   Normal respiratory effort without tachypnea/retractions. Breath sounds are clear and equal bilaterally. No wheezes/rales/rhonchi. Gastrointestinal:   Soft and nontender. Non distended. There is no CVA tenderness.  No rebound, rigidity, or guarding. Genitourinary:   deferred Musculoskeletal:   Normal range of motion in all extremities. No joint effusions.  No lower extremity tenderness.  No edema. Neurologic:   Normal speech and language.  Motor grossly intact. No acute focal neurologic deficits are appreciated.  Skin:    Skin is warm, dry and intact. No rash noted.  No petechiae, purpura, or bullae.  ____________________________________________    LABS (pertinent positives/negatives) (all labs ordered are listed, but only abnormal results are displayed) Labs Reviewed  COMPREHENSIVE METABOLIC PANEL - Abnormal; Notable for the following  components:      Result Value  Glucose, Bld 107 (*)    Creatinine, Ser 1.11 (*)    All other components within normal limits  CBC - Abnormal; Notable for the following components:   RBC 3.73 (*)    Hemoglobin 10.7 (*)    HCT 33.6 (*)    All other components within normal limits  URINALYSIS, COMPLETE (UACMP) WITH MICROSCOPIC  URINE DRUG SCREEN, QUALITATIVE ([facility] ONLY)   ____________________________________________   EKG  Interpreted by me Normal sinus rhythm rate of 95, normal axis and intervals.  Normal QRS ST segments and T waves.  No ischemic changes  ____________________________________________    RADIOLOGY  DG Abdomen 1 View  Result Date: 04/02/2020 CLINICAL DATA:  Abdominal distension, lower abdominal pain, discharged Thursday following admission for partial obstruction EXAM: ABDOMEN - 1 VIEW COMPARISON:  Radiograph 03/30/2020, CT 03/29/2020 FINDINGS: Battery pack overlies the right mid abdomen with spine stimulator leads spanning the T7-T10 levels. Surgical clips noted in the deep pelvis. Moderate colonic stool burden, predominantly right-sided albeit with air and stool over the rectal vault. No high-grade obstructive bowel gas pattern. No subdiaphragmatic free air. No suspicious abdominal calcifications. Likely atelectatic changes in the lung bases. Mediastinal contour similar to priors. Moderate to severe left and mild right hip osteoarthrosis with additional degenerative changes in the spine and bilateral SI joints. IMPRESSION: 1. No high-grade obstructive bowel gas pattern. 2. Moderate colonic stool burden, predominantly right-sided. Electronically Signed   By: Lovena Le M.D.   On: 04/02/2020 22:09    ____________________________________________   PROCEDURES Procedures  ____________________________________________  DIFFERENTIAL DIAGNOSIS   Dehydration, electrolyte abnormality, polypharmacy sedation side effects, ileus  CLINICAL IMPRESSION / ASSESSMENT AND PLAN  / ED COURSE  Medications ordered in the ED: Medications  sodium chloride 0.9 % bolus 1,000 mL (1,000 mLs Intravenous New Bag/Given 04/02/20 2237)    Pertinent labs & imaging results that were available during my care of the patient were reviewed by me and considered in my medical decision making (see chart for details).  Ola Raap was evaluated in Emergency Department on 04/02/2020 for the symptoms described in the history of present illness. She was evaluated in the context of the global COVID-19 pandemic, which necessitated consideration that the patient might be at risk for infection with the SARS-CoV-2 virus that causes COVID-19. Institutional protocols and algorithms that pertain to the evaluation of patients at risk for COVID-19 are in a state of rapid change based on information released by regulatory bodies including the CDC and federal and state organizations. These policies and algorithms were followed during the patient's care in the ED.     Clinical Course as of 04/02/20 2359  Sat Apr 02, 2020  2146 Patient presents with lightheadedness.  Clinically appears dehydrated.  Vital signs are normal, initial serum labs are normal.  She is quite somnolent, with her medication list there is concern for excess sedation as a side effect of her medications.  Doubt intentional self-harm or overdose. [PS]    Clinical Course User Index [PS] Carrie Mew, MD    ----------------------------------------- 12:01 AM on 04/03/2020 -----------------------------------------  Patient now awake, normal mental status.  Vital signs remain normal.  KUB unremarkable, lab panel unremarkable.  Feeling better after IV fluids.  Stable for discharge home, recommend follow-up with her primary care doctor.   ____________________________________________   FINAL CLINICAL IMPRESSION(S) / ED DIAGNOSES    Final diagnoses:  Dizziness  Dehydration     ED Discharge Orders    None      Portions of  this note were generated with dragon dictation software. Dictation errors may occur despite best attempts at proofreading.

## 2020-04-02 NOTE — ED Notes (Addendum)
ED Provider at bedside. 

## 2020-04-02 NOTE — ED Notes (Signed)
Patient in stretcher. Placed on monitor. Arouses to voice, falls asleep shortly after answering questions; oriented x4. Patient states "I'm dizzy and I feel like I'm about to pass out".  Bowel sounds heard in all 4 quadrants. Patient is wearing abdominal binder.

## 2020-04-02 NOTE — ED Provider Notes (Signed)
[facility] Emergency Department Provider Note  ____________________________________________  Time seen: Approximately 11:59 PM  I have reviewed the triage vital signs and the nursing notes.   HISTORY  Chief Complaint Abdominal Pain (Onset; ongoing since yesterday , n/v , blurry vision )    HPI Marie Clarke is a 50 y.o. female with a past history of anxiety, CAD CHF COPD diabetes depression  fibromyalgia who is brought to the ED today due to abdominal pain and nausea, also reportedly was feeling lightheaded earlier today.  No vomiting, no diarrhea or constipation.  Was recently in the hospital for partial SBO, discharged 2 days ago.  No fever.  Reports compliance with her medications and eating normally.     Past Medical History:  Diagnosis Date  . Anemia   . Anginal pain ([place])   . Anxiety   . Arthritis   . Asthma   . Bilateral lower extremity edema   . Bipolar disorder ([place])   . CAD (coronary artery disease) unk  . CHF (congestive heart failure) ([place])   . Chronic pain syndrome   . COPD (chronic obstructive pulmonary disease) (HCC)    emphysema  . DDD (degenerative disc disease), lumbar   . Depression unk  . Diabetes mellitus without complication ([place])   . Diabetes mellitus, type II ([place])   . Drug overdose 27-May-2013   after dad died  . Dysrhythmia    history of tachy arrythmias  . Fibromyalgia   . GERD (gastroesophageal reflux disease)   . Headache    chronic migraines  . Hyperlipidemia   . Hypertension   . Hypothyroidism   . Left leg pain 04/29/2014  . MI (myocardial infarction) ([place]) 05-27-05  . Muscle ache 09/16/2014  . Osteoporosis   . Overactive bladder   . Pancreatitis unk  . Panic attacks   . PTSD (post-traumatic stress disorder)   . Reflex sympathetic dystrophy   . Renal cyst, left   . Renal insufficiency    ckd stage iii  . Restless legs syndrome   . Sleep apnea 06/2019   uses cpap  . Sleep apnea   . Stroke ([place]) 05-27-08   TIA x 2. no  residual deficits  . Suicide attempt [facility])    after father died.  . Thyroid disease    thyroid nodule  . TIA (transient ischemic attack) unk  . TIA (transient ischemic attack)      Patient Active Problem List   Diagnosis Date Noted  . Partial small bowel obstruction ([place]) 03/29/2020  . Hypokalemia   . SBO (small bowel obstruction) ([place]) 12/02/2019  . Chronic pain 12/02/2019  . Presence of neurostimulator (SCS) (June 2021) 10/06/2019  . Pain due to any device, implant or graft 09/21/2019  . Anxiety 09/10/2019  . Involuntary movements 09/10/2019  . Abnormal involuntary movement 08/18/2019  . Bipolar disorder, in full remission, most recent episode mixed ([place]) 08/11/2019  . Lumbosacral radiculopathy at L5 (Right) 04/09/2019  . Chronic migraine 03/12/2019  . Gout 01/21/2019  . Noncompliance with treatment regimen 12/29/2018  . BMI 40.0-44.9, adult ([place]) 11/19/2018  . AKI (acute kidney injury) ([place]) 11/17/2018  . Postural dizziness with presyncope 09/29/2018  . Bipolar I disorder, most recent episode mixed ([place]) 09/10/2018  . Panic attacks 09/10/2018  . Chronic lumbosacral L5-S1 IVD protrusion (Bilateral) 08/25/2018  . Lumbar lateral recess stenosis (L5-S1) (Bilateral) 08/25/2018  . Wound disruption 08/05/2018  . Osteoarthritis involving multiple joints 07/10/2018  . Long term current use of non-steroidal anti-inflammatories (NSAID)  07/10/2018  . NSAID induced gastritis 07/10/2018  . DDD (degenerative disc disease), lumbosacral 04/22/2018  . Disease related peripheral neuropathy 11/13/2017  . Gastroesophageal reflux disease without esophagitis 11/13/2017  . Occipital headache (Bilateral) 11/13/2017  . Cervicogenic headache (Bilateral) (L>R) 11/13/2017  . History of postoperative nausea 10/22/2017  . Chronic shoulder pain (5th area of Pain) (Bilateral) (L>R) 10/09/2017  . Ankle joint instability (Left) 10/09/2017  . Ankle sprain, sequela (Left) 10/09/2017  . History of  psychiatric symptoms 10/09/2017  . Chronic pain syndrome 10/02/2017  . Spondylosis without myelopathy or radiculopathy, lumbosacral region 10/02/2017  . Chronic musculoskeletal pain 10/02/2017  . Elevated C-reactive protein (CRP) 09/10/2017  . Elevated sed rate 09/10/2017  . Chronic neck pain (4th area of Pain) (Bilateral) (L>R) 09/09/2017  . Pharmacologic therapy 09/09/2017  . Disorder of skeletal system 09/09/2017  . Problems influencing health status 09/09/2017  . Long term current use of opiate analgesic 09/09/2017  . Tobacco use disorder 07/16/2017  . Ventral hernia without obstruction or gangrene 06/25/2017  . Strain of extensor muscle, fascia and tendon of left index finger at wrist and hand level, initial encounter 05/16/2017  . Sepsis ([place]) 04/14/2017  . Osteopenia 04/03/2017  . Hypotension 09/17/2016  . Contusion of knee (Left) 10/12/2015  . Strain of knee (Left) 10/12/2015  . Incidental lung nodule 04/28/2015  . Chronic low back pain (1ry area of Pain) (Bilateral) (L>R) 03/28/2015  . Chronic lower extremity pain (Referred) (2ry area of Pain) (Left) 03/28/2015  . Abdominal wound dehiscence 03/28/2015  . Encounter for pain management planning 03/28/2015  . Morbid obesity ([place]) 03/28/2015  . Abnormal CT scan, lumbar spine 03/28/2015  . Lumbar facet hypertrophy 03/28/2015  . Lumbar facet syndrome (Bilateral) (L>R) 03/28/2015  . Lumbar foraminal stenosis (Bilateral) (L5-S1) 03/28/2015  . Chronic ankle pain (3ry area of Pain) (Left) 03/28/2015  . Neurogenic pain 03/28/2015  . Neuropathic pain 03/28/2015  . Myofascial pain 03/28/2015  . History of suicide attempt 03/28/2015  . PTSD (post-traumatic stress disorder) 01/13/2015  . Abnormal gait 12/15/2014  . Congestive heart failure ([place]) 11/15/2014  . Abdominal wall abscess 09/20/2014  . 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 Diabetes mellitus, type 2 ([place]) 08/13/2014  . Bipolar affective disorder ([place]) 08/13/2014  . Type 2  diabetes mellitus ([place]) 08/13/2014  . Rectal prolapse 08/09/2014  . Rectal bleeding 08/09/2014  . Rectal bleed 08/09/2014  . Affective bipolar disorder ([place]) 08/05/2014  . Arteriosclerosis of coronary artery 08/05/2014  . CCF (congestive cardiac failure) ([place]) 08/05/2014  . Chronic kidney disease 08/05/2014  . 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 Apnea, sleep 08/05/2014  . Temporary cerebral vascular dysfunction 08/05/2014  . Polypharmacy 04/29/2014  . Other long term (current) drug therapy 04/29/2014  . 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 Chronic kidney disease, stage III (moderate) ([place]) 12/14/2013  . Controlled diabetes mellitus type II without complication ([place]) 82/99/3716  . Essential (primary) hypertension 12/03/2013  . Adult hypothyroidism 12/03/2013  . Controlled type 2 diabetes mellitus without complication ([place]) 96/78/9381     Past Surgical History:  Procedure Laterality Date  . ABDOMINAL HYSTERECTOMY    . CARDIAC CATHETERIZATION    . HERNIA REPAIR  07/15/2017   [facility]  . lumbar facet     several  . prolapse rectum surgery N/A July 2016  . THORACIC LAMINECTOMY FOR SPINAL CORD STIMULATOR N/A 07/27/2019   Procedure: THORACIC SPINAL CORD STIMULATOR PLACEMENT WITH RIGHT FLANK PULSE GENERATOR;  Surgeon: [name], MD;  Location: [facility] ORS;  Service: Neurosurgery;  Laterality:  N/A;  . TONSILLECTOMY       Prior to Admission medications   Medication Sig Start Date End Date Taking? Authorizing Provider  Albuterol Sulfate (PROAIR RESPICLICK) 973 (90 Base) MCG/ACT AEPB Inhale 2 puffs into the lungs every 6 (six) hours as needed. 10/26/19   [provider]  baclofen (LIORESAL) 20 MG tablet Take 20 mg by mouth 3 (three) times daily. 03/23/20   [provider]  benztropine (COGENTIN) 0.5 MG tablet Take 1 tablet (0.5 mg total) by mouth 2 (two) times daily as needed for tremors. 01/18/20   Ursula Alert, MD  busPIRone (BUSPAR) 15 MG tablet Take 1 tablet (15 mg  total) by mouth 2 (two) times daily. 12/28/19   Ursula Alert, MD  cyclobenzaprine (FLEXERIL) 10 MG tablet Take 10 mg by mouth 3 (three) times daily as needed. 12/17/19   [provider]  desvenlafaxine (PRISTIQ) 100 MG 24 hr tablet Take 1 tablet (100 mg total) by mouth daily. 01/01/20   Ursula Alert, MD  dicyclomine (BENTYL) 10 MG capsule Take 10 mg by mouth 4 (four) times daily -  before meals and at bedtime. 10/16/19   [provider]  esomeprazole (NEXIUM) 40 MG capsule Take by mouth. 12/23/19 03/29/20  [provider]  fluticasone (FLONASE) 50 MCG/ACT nasal spray Place 2 sprays into both nostrils daily. 06/03/19   [provider]  glipiZIDE (GLUCOTROL XL) 10 MG 24 hr tablet Take 10 mg by mouth daily with breakfast. 11/17/19 11/16/20  [provider]  levocetirizine (XYZAL) 5 MG tablet Take 5 mg by mouth every evening. 01/08/20   [provider]  levothyroxine (SYNTHROID) 175 MCG tablet Take 175 mcg by mouth daily before breakfast. 11/10/19 11/09/20  [provider]  LORazepam (ATIVAN) 0.5 MG tablet Take 1 tablet (0.5 mg total) by mouth as directed. Take 1 tablet 3-4 times a week only for severe panic attacks 01/01/20   Ursula Alert, MD  meloxicam (MOBIC) 7.5 MG tablet Take 7.5 mg by mouth 2 (two) times daily. 02/20/20   [provider]  ondansetron (ZOFRAN-ODT) 8 MG disintegrating tablet Take 8 mg by mouth every 8 (eight) hours as needed. 12/29/19   [provider]  polyethylene glycol (MIRALAX) 17 g packet Take 17 g by mouth daily for 14 days. 03/26/20 04/09/20  Christy Gentles, MD  potassium chloride SA (KLOR-CON) 20 MEQ tablet Take 20 mEq by mouth 2 (two) times daily. 12/25/19   [provider]  pregabalin (LYRICA) 150 MG capsule Take 1 capsule (150 mg total) by mouth 3 (three) times daily. Do not take gabapentin (Neurontin) while taking this medication. 03/04/20 06/02/20  Milinda Pointer, MD  promethazine  (PHENERGAN) 25 MG tablet Take 25 mg by mouth every 6 (six) hours as needed. 03/26/20   [provider]  QUEtiapine (SEROQUEL) 100 MG tablet Take 100 mg by mouth at bedtime. 11/25/19   [provider]  QUEtiapine (SEROQUEL) 25 MG tablet Take 25 mg by mouth 2 (two) times daily. Takes one 25mg  with 100 mg at bedtime and 25 mg in the morning    [provider]  rOPINIRole (REQUIP) 1 MG tablet Take 1 mg by mouth 3 (three) times daily. 12/10/19   [provider]     Allergies Diazepam, Ziprasidone hcl, Azithromycin, Divalproex sodium, Levofloxacin, Lisinopril, Metronidazole, Sulfa antibiotics, Valproic acid, Cephalexin, Ciprofloxacin, and Penicillins   Family History  Problem Relation Age of Onset  . Diabetes Mellitus II Mother   . CAD Mother   .  Sleep apnea Mother   . Osteoarthritis Mother   . Osteoporosis Mother   . Anxiety disorder Mother   . Depression Mother   . Bipolar disorder Mother   . Bipolar disorder Father   . Hypertension Father   . Depression Father   . Anxiety disorder Father   . Post-traumatic stress disorder Sister     Social History Social History   Tobacco Use  . Smoking status: Former Smoker    Packs/day: 0.50    Types: Cigarettes    Quit date: 09/01/2018    Years since quitting: 1.5  . Smokeless tobacco: Never Used  . Tobacco comment: Patient quit smoking 09/01/2018  Vaping Use  . Vaping Use: Never used  Substance Use Topics  . Alcohol use: No    Alcohol/week: 0.0 standard drinks  . Drug use: No    Review of Systems  Constitutional:   No fever or chills.  ENT:   No sore throat. No rhinorrhea. Cardiovascular:   No chest pain or syncope. Respiratory:   No dyspnea or cough. Gastrointestinal:   Positive for abdominal pain.  No vomiting or diarrhea or constipation. Musculoskeletal:   Negative for focal pain or swelling All other systems reviewed and are negative except as documented above in ROS and  HPI.  ____________________________________________   PHYSICAL EXAM:  VITAL SIGNS: ED Triage Vitals  Enc Vitals Group     BP 04/02/20 1907 (!) 114/95     Pulse Rate 04/02/20 1907 96     Resp 04/02/20 1908 18     Temp 04/02/20 1907 97.9 F (36.6 C)     Temp Source 04/02/20 1907 Oral     SpO2 04/02/20 1907 100 %     Weight 04/02/20 1913 225 lb (102.1 kg)     Height 04/02/20 1913 5\' 4"  (1.626 m)     Head Circumference --      Peak Flow --      Pain Score 04/02/20 1913 10     Pain Loc --      Pain Edu? --      Excl. in [place]? --     Vital signs reviewed, nursing assessments reviewed.   Constitutional:   Alert and oriented. Non-toxic appearance. Eyes:   Conjunctivae are normal. EOMI. PERRL. ENT      Head:   Normocephalic and atraumatic.      Nose:   Normal.      Mouth/Throat:   Dry mucous membranes      Neck:   No meningismus. Full ROM. Hematological/Lymphatic/Immunilogical:   No cervical lymphadenopathy. Cardiovascular:   RRR. Symmetric bilateral radial and DP pulses.  No murmurs. Cap refill less than 2 seconds. Respiratory:   Normal respiratory effort without tachypnea/retractions. Breath sounds are clear and equal bilaterally. No wheezes/rales/rhonchi. Gastrointestinal:   Soft and nontender. Non distended.  No rebound, rigidity, or guarding.  Musculoskeletal:   Normal range of motion in all extremities. No joint effusions.  No lower extremity tenderness.  No edema. Neurologic:   Somnolent but arousable. Normal speech and language.  Motor grossly intact. No acute focal neurologic deficits are appreciated.  Skin:    Skin is warm, dry and intact. No rash noted.  No petechiae, purpura, or bullae.  ____________________________________________    LABS (pertinent positives/negatives) (all labs ordered are listed, but only abnormal results are displayed) Labs Reviewed  COMPREHENSIVE METABOLIC PANEL - Abnormal; Notable for the following components:      Result Value    Glucose, Bld 107 (*)  Creatinine, Ser 1.11 (*)    All other components within normal limits  CBC - Abnormal; Notable for the following components:   RBC 3.73 (*)    Hemoglobin 10.7 (*)    HCT 33.6 (*)    All other components within normal limits  URINALYSIS, COMPLETE (UACMP) WITH MICROSCOPIC  URINE DRUG SCREEN, QUALITATIVE ([facility] ONLY)   ____________________________________________   EKG  Interpreted by me Normal sinus rhythm rate of 95, normal axis and intervals.  Normal QRS ST segments and T waves.  No ischemic changes  ____________________________________________    RADIOLOGY  DG Abdomen 1 View  Result Date: 04/02/2020 CLINICAL DATA:  Abdominal distension, lower abdominal pain, discharged Thursday following admission for partial obstruction EXAM: ABDOMEN - 1 VIEW COMPARISON:  Radiograph 03/30/2020, CT 03/29/2020 FINDINGS: Battery pack overlies the right mid abdomen with spine stimulator leads spanning the T7-T10 levels. Surgical clips noted in the deep pelvis. Moderate colonic stool burden, predominantly right-sided albeit with air and stool over the rectal vault. No high-grade obstructive bowel gas pattern. No subdiaphragmatic free air. No suspicious abdominal calcifications. Likely atelectatic changes in the lung bases. Mediastinal contour similar to priors. Moderate to severe left and mild right hip osteoarthrosis with additional degenerative changes in the spine and bilateral SI joints. IMPRESSION: 1. No high-grade obstructive bowel gas pattern. 2. Moderate colonic stool burden, predominantly right-sided. Electronically Signed   By: Lovena Le M.D.   On: 04/02/2020 22:09    ____________________________________________   PROCEDURES Procedures  ____________________________________________  DIFFERENTIAL DIAGNOSIS   Dehydration, electrolyte abnormality, polypharmacy sedation side effects, ileus  CLINICAL IMPRESSION / ASSESSMENT AND PLAN / ED COURSE  Medications  ordered in the ED: Medications  sodium chloride 0.9 % bolus 1,000 mL (1,000 mLs Intravenous New Bag/Given 04/02/20 2237)    Pertinent labs & imaging results that were available during my care of the patient were reviewed by me and considered in my medical decision making (see chart for details).  Marie Clarke was evaluated in Emergency Department on 04/03/2020 for the symptoms described in the history of present illness. She was evaluated in the context of the global COVID-19 pandemic, which necessitated consideration that the patient might be at risk for infection with the SARS-CoV-2 virus that causes COVID-19. Institutional protocols and algorithms that pertain to the evaluation of patients at risk for COVID-19 are in a state of rapid change based on information released by regulatory bodies including the CDC and federal and state organizations. These policies and algorithms were followed during the patient's care in the ED.     Clinical Course as of 04/03/20 0003  Sat Apr 02, 2020  2146 Patient presents with lightheadedness.  Clinically appears dehydrated.  Vital signs are normal, initial serum labs are normal.  She is quite somnolent, with her medication list there is concern for excess sedation as a side effect of her medications.  Doubt intentional self-harm or overdose. [PS]    Clinical Course User Index [PS] Carrie Mew, MD    ----------------------------------------- 12:01 AM on 04/03/2020 -----------------------------------------  Patient now awake, normal mental status.  Vital signs remain normal.  KUB unremarkable, lab panel unremarkable.  Feeling better after IV fluids.  Stable for discharge home, recommend follow-up with her primary care doctor.   ____________________________________________   FINAL CLINICAL IMPRESSION(S) / ED DIAGNOSES    Final diagnoses:  Dizziness  Dehydration  Type 2 diabetes mellitus without complication, without long-term current use of  insulin (HCC)  Chronic obstructive pulmonary disease, unspecified COPD type ([place])  Morbid obesity ([place])  Chronic congestive heart failure, unspecified heart failure type [facility])     ED Discharge Orders    None      Portions of this note were generated with dragon dictation software. Dictation errors may occur despite best attempts at proofreading.   Carrie Mew, MD 04/03/20 [phone]

## 2020-04-03 LAB — URINE DRUG SCREEN, QUALITATIVE (ARMC ONLY)
Amphetamines, Ur Screen: NOT DETECTED
Barbiturates, Ur Screen: NOT DETECTED
Benzodiazepine, Ur Scrn: NOT DETECTED
Cannabinoid 50 Ng, Ur ~~LOC~~: NOT DETECTED
Cocaine Metabolite,Ur ~~LOC~~: NOT DETECTED
MDMA (Ecstasy)Ur Screen: NOT DETECTED
Methadone Scn, Ur: NOT DETECTED
Opiate, Ur Screen: NOT DETECTED
Phencyclidine (PCP) Ur S: NOT DETECTED
Tricyclic, Ur Screen: NOT DETECTED

## 2020-04-03 LAB — URINALYSIS, COMPLETE (UACMP) WITH MICROSCOPIC
Bacteria, UA: NONE SEEN
Bilirubin Urine: NEGATIVE
Glucose, UA: NEGATIVE mg/dL
Hgb urine dipstick: NEGATIVE
Ketones, ur: NEGATIVE mg/dL
Leukocytes,Ua: NEGATIVE
Nitrite: NEGATIVE
Protein, ur: NEGATIVE mg/dL
Specific Gravity, Urine: 1.011 (ref 1.005–1.030)
pH: 6 (ref 5.0–8.0)

## 2020-04-07 DIAGNOSIS — K6289 Other specified diseases of anus and rectum: Secondary | ICD-10-CM | POA: Insufficient documentation

## 2020-04-08 ENCOUNTER — Other Ambulatory Visit: Payer: Self-pay | Admitting: Psychiatry

## 2020-04-08 ENCOUNTER — Telehealth: Payer: Self-pay | Admitting: Psychiatry

## 2020-04-08 DIAGNOSIS — F410 Panic disorder [episodic paroxysmal anxiety] without agoraphobia: Secondary | ICD-10-CM

## 2020-04-08 DIAGNOSIS — F4310 Post-traumatic stress disorder, unspecified: Secondary | ICD-10-CM

## 2020-04-08 NOTE — Telephone Encounter (Signed)
Please contact this patient for an appointment.

## 2020-04-22 ENCOUNTER — Other Ambulatory Visit (HOSPITAL_COMMUNITY): Payer: Self-pay | Admitting: Internal Medicine

## 2020-04-22 DIAGNOSIS — R0789 Other chest pain: Secondary | ICD-10-CM

## 2020-04-22 DIAGNOSIS — R0602 Shortness of breath: Secondary | ICD-10-CM

## 2020-04-22 DIAGNOSIS — I509 Heart failure, unspecified: Secondary | ICD-10-CM

## 2020-04-27 ENCOUNTER — Emergency Department: Payer: Medicare Other

## 2020-04-27 ENCOUNTER — Emergency Department
Admission: EM | Admit: 2020-04-27 | Discharge: 2020-04-28 | Disposition: A | Discharge status: Home / Self Care | Payer: Medicare Other | Attending: Emergency Medicine | Admitting: Emergency Medicine

## 2020-04-27 ENCOUNTER — Other Ambulatory Visit: Payer: Self-pay

## 2020-04-27 DIAGNOSIS — Z87891 Personal history of nicotine dependence: Secondary | ICD-10-CM | POA: Insufficient documentation

## 2020-04-27 DIAGNOSIS — J449 Chronic obstructive pulmonary disease, unspecified: Secondary | ICD-10-CM | POA: Insufficient documentation

## 2020-04-27 DIAGNOSIS — E039 Hypothyroidism, unspecified: Secondary | ICD-10-CM | POA: Insufficient documentation

## 2020-04-27 DIAGNOSIS — K5900 Constipation, unspecified: Secondary | ICD-10-CM | POA: Diagnosis not present

## 2020-04-27 DIAGNOSIS — J45909 Unspecified asthma, uncomplicated: Secondary | ICD-10-CM | POA: Diagnosis not present

## 2020-04-27 DIAGNOSIS — I509 Heart failure, unspecified: Secondary | ICD-10-CM | POA: Diagnosis not present

## 2020-04-27 DIAGNOSIS — E1122 Type 2 diabetes mellitus with diabetic chronic kidney disease: Secondary | ICD-10-CM | POA: Insufficient documentation

## 2020-04-27 DIAGNOSIS — E1169 Type 2 diabetes mellitus with other specified complication: Secondary | ICD-10-CM | POA: Diagnosis not present

## 2020-04-27 DIAGNOSIS — Z79899 Other long term (current) drug therapy: Secondary | ICD-10-CM | POA: Insufficient documentation

## 2020-04-27 DIAGNOSIS — I130 Hypertensive heart and chronic kidney disease with heart failure and stage 1 through stage 4 chronic kidney disease, or unspecified chronic kidney disease: Secondary | ICD-10-CM | POA: Diagnosis not present

## 2020-04-27 DIAGNOSIS — N183 Chronic kidney disease, stage 3 unspecified: Secondary | ICD-10-CM | POA: Insufficient documentation

## 2020-04-27 DIAGNOSIS — Z7984 Long term (current) use of oral hypoglycemic drugs: Secondary | ICD-10-CM | POA: Diagnosis not present

## 2020-04-27 DIAGNOSIS — R1084 Generalized abdominal pain: Secondary | ICD-10-CM

## 2020-04-27 DIAGNOSIS — R109 Unspecified abdominal pain: Secondary | ICD-10-CM | POA: Diagnosis present

## 2020-04-27 DIAGNOSIS — R110 Nausea: Secondary | ICD-10-CM

## 2020-04-27 DIAGNOSIS — E785 Hyperlipidemia, unspecified: Secondary | ICD-10-CM | POA: Diagnosis not present

## 2020-04-27 DIAGNOSIS — K469 Unspecified abdominal hernia without obstruction or gangrene: Secondary | ICD-10-CM | POA: Diagnosis not present

## 2020-04-27 LAB — CBC
HCT: 38.8 % (ref 36.0–46.0)
Hemoglobin: 12.6 g/dL (ref 12.0–15.0)
MCH: 28.6 pg (ref 26.0–34.0)
MCHC: 32.5 g/dL (ref 30.0–36.0)
MCV: 88 fL (ref 80.0–100.0)
Platelets: 277 10*3/uL (ref 150–400)
RBC: 4.41 MIL/uL (ref 3.87–5.11)
RDW: 14.4 % (ref 11.5–15.5)
WBC: 9.2 10*3/uL (ref 4.0–10.5)
nRBC: 0 % (ref 0.0–0.2)

## 2020-04-27 LAB — COMPREHENSIVE METABOLIC PANEL
ALT: 18 U/L (ref 0–44)
AST: 23 U/L (ref 15–41)
Albumin: 4.7 g/dL (ref 3.5–5.0)
Alkaline Phosphatase: 132 U/L — ABNORMAL HIGH (ref 38–126)
Anion gap: 12 (ref 5–15)
BUN: 24 mg/dL — ABNORMAL HIGH (ref 6–20)
CO2: 24 mmol/L (ref 22–32)
Calcium: 9.6 mg/dL (ref 8.9–10.3)
Chloride: 99 mmol/L (ref 98–111)
Creatinine, Ser: 1.22 mg/dL — ABNORMAL HIGH (ref 0.44–1.00)
GFR, Estimated: 55 mL/min — ABNORMAL LOW (ref >60)
Glucose, Bld: 204 mg/dL — ABNORMAL HIGH (ref 70–99)
Potassium: 3.6 mmol/L (ref 3.5–5.1)
Sodium: 135 mmol/L (ref 135–145)
Total Bilirubin: 0.7 mg/dL (ref 0.3–1.2)
Total Protein: 8.6 g/dL — ABNORMAL HIGH (ref 6.5–8.1)

## 2020-04-27 LAB — LIPASE, BLOOD: Lipase: 38 U/L (ref 11–51)

## 2020-04-27 MED ORDER — SODIUM CHLORIDE 0.9 % IV BOLUS
1000.0000 mL | Freq: Once | INTRAVENOUS | Status: AC
  Administered 2020-04-27: 1000 mL via INTRAVENOUS

## 2020-04-27 MED ORDER — ONDANSETRON HCL 4 MG/2ML IJ SOLN
4.0000 mg | Freq: Once | INTRAMUSCULAR | Status: AC
  Administered 2020-04-27: 4 mg via INTRAVENOUS
  Filled 2020-04-27: qty 2

## 2020-04-27 MED ORDER — IOHEXOL 300 MG/ML  SOLN
100.0000 mL | Freq: Once | INTRAMUSCULAR | Status: AC | PRN
  Administered 2020-04-27: 100 mL via INTRAVENOUS

## 2020-04-27 MED ORDER — MORPHINE SULFATE (PF) 4 MG/ML IV SOLN
4.0000 mg | Freq: Once | INTRAVENOUS | Status: AC
  Administered 2020-04-27: 4 mg via INTRAVENOUS
  Filled 2020-04-27: qty 1

## 2020-04-27 MED ORDER — FENTANYL CITRATE (PF) 100 MCG/2ML IJ SOLN
50.0000 ug | Freq: Once | INTRAMUSCULAR | Status: AC
  Administered 2020-04-27: 50 ug via INTRAVENOUS
  Filled 2020-04-27: qty 2

## 2020-04-27 NOTE — ED Triage Notes (Signed)
Pt to ED via EMS from home, in Feb. pt had  partial bowel obstruction. Pt started having abdominal pain last night and has since been unable to eat. Last bowel movement 2 days ago.

## 2020-04-27 NOTE — ED Provider Notes (Signed)
[facility] Emergency Department Provider Note  Time seen: 10:31 PM  I have reviewed the triage vital signs and the nursing notes.   HISTORY  Chief Complaint Abdominal Pain   HPI Marie Clarke is a 50 y.o. female with multiple past medical issues including hernia and partial small bowel obstruction who presents to the emergency department for abdominal pain nausea and constipation.  According to the patient for the past 2 to 3 days she has been experiencing mid to right-sided abdominal pain, has been nauseated at times and has been constipated.  Patient states symptoms are similar to when she had a partial small bowel obstruction.  She was concerned that she could have another bowel obstruction so she came to the emergency department for evaluation.  Here the patient states moderate abdominal pain but states she has been experiencing muscle cramping as well and thinks she is dehydrated.  Denies any known fever.  No chest pain.   Past Medical History:  Diagnosis Date  . Anemia   . Anginal pain ([place])   . Anxiety   . Arthritis   . Asthma   . Bilateral lower extremity edema   . Bipolar disorder ([place])   . CAD (coronary artery disease) unk  . CHF (congestive heart failure) ([place])   . Chronic pain syndrome   . COPD (chronic obstructive pulmonary disease) (HCC)    emphysema  . DDD (degenerative disc disease), lumbar   . Depression unk  . Diabetes mellitus without complication ([place])   . Diabetes mellitus, type II ([place])   . Drug overdose 05-23-2013   after dad died  . Dysrhythmia    history of tachy arrythmias  . Fibromyalgia   . GERD (gastroesophageal reflux disease)   . Headache    chronic migraines  . Hyperlipidemia   . Hypertension   . Hypothyroidism   . Left leg pain 04/29/2014  . MI (myocardial infarction) ([place]) 05/23/05  . Muscle ache 09/16/2014  . Osteoporosis   . Overactive bladder   . Pancreatitis unk  . Panic attacks   . PTSD (post-traumatic stress disorder)    . Reflex sympathetic dystrophy   . Renal cyst, left   . Renal insufficiency    ckd stage iii  . Restless legs syndrome   . Sleep apnea 06/2019   uses cpap  . Sleep apnea   . Stroke ([place]) 05-23-2008   TIA x 2. no residual deficits  . Suicide attempt [facility])    after father died.  . Thyroid disease    thyroid nodule  . TIA (transient ischemic attack) unk  . TIA (transient ischemic attack)     Patient Active Problem List   Diagnosis Date Noted  . Partial small bowel obstruction ([place]) 03/29/2020  . Hypokalemia   . SBO (small bowel obstruction) ([place]) 12/02/2019  . Chronic pain 12/02/2019  . Presence of neurostimulator (SCS) (June 2021) 10/06/2019  . Pain due to any device, implant or graft 09/21/2019  . Anxiety 09/10/2019  . Involuntary movements 09/10/2019  . Abnormal involuntary movement 08/18/2019  . Bipolar disorder, in full remission, most recent episode mixed ([place]) 08/11/2019  . Lumbosacral radiculopathy at L5 (Right) 04/09/2019  . Chronic migraine 03/12/2019  . Gout 01/21/2019  . Noncompliance with treatment regimen 12/29/2018  . BMI 40.0-44.9, adult ([place]) 11/19/2018  . AKI (acute kidney injury) ([place]) 11/17/2018  . Postural dizziness with presyncope 09/29/2018  . Bipolar I disorder, most recent episode mixed ([place]) 09/10/2018  . Panic attacks 09/10/2018  .  Chronic lumbosacral L5-S1 IVD protrusion (Bilateral) 08/25/2018  . Lumbar lateral recess stenosis (L5-S1) (Bilateral) 08/25/2018  . Wound disruption 08/05/2018  . Osteoarthritis involving multiple joints 07/10/2018  . Long term current use of non-steroidal anti-inflammatories (NSAID) 07/10/2018  . NSAID induced gastritis 07/10/2018  . DDD (degenerative disc disease), lumbosacral 04/22/2018  . Disease related peripheral neuropathy 11/13/2017  . Gastroesophageal reflux disease without esophagitis 11/13/2017  . Occipital headache (Bilateral) 11/13/2017  . Cervicogenic headache (Bilateral) (L>R) 11/13/2017  . History of  postoperative nausea 10/22/2017  . Chronic shoulder pain (5th area of Pain) (Bilateral) (L>R) 10/09/2017  . Ankle joint instability (Left) 10/09/2017  . Ankle sprain, sequela (Left) 10/09/2017  . History of psychiatric symptoms 10/09/2017  . Chronic pain syndrome 10/02/2017  . Spondylosis without myelopathy or radiculopathy, lumbosacral region 10/02/2017  . Chronic musculoskeletal pain 10/02/2017  . Elevated C-reactive protein (CRP) 09/10/2017  . Elevated sed rate 09/10/2017  . Chronic neck pain (4th area of Pain) (Bilateral) (L>R) 09/09/2017  . Pharmacologic therapy 09/09/2017  . Disorder of skeletal system 09/09/2017  . Problems influencing health status 09/09/2017  . Long term current use of opiate analgesic 09/09/2017  . Tobacco use disorder 07/16/2017  . Ventral hernia without obstruction or gangrene 06/25/2017  . Strain of extensor muscle, fascia and tendon of left index finger at wrist and hand level, initial encounter 05/16/2017  . Sepsis ([place]) 04/14/2017  . Osteopenia 04/03/2017  . Hypotension 09/17/2016  . Contusion of knee (Left) 10/12/2015  . Strain of knee (Left) 10/12/2015  . Incidental lung nodule 04/28/2015  . Chronic low back pain (1ry area of Pain) (Bilateral) (L>R) 03/28/2015  . Chronic lower extremity pain (Referred) (2ry area of Pain) (Left) 03/28/2015  . Abdominal wound dehiscence 03/28/2015  . Encounter for pain management planning 03/28/2015  . Morbid obesity ([place]) 03/28/2015  . Abnormal CT scan, lumbar spine 03/28/2015  . Lumbar facet hypertrophy 03/28/2015  . Lumbar facet syndrome (Bilateral) (L>R) 03/28/2015  . Lumbar foraminal stenosis (Bilateral) (L5-S1) 03/28/2015  . Chronic ankle pain (3ry area of Pain) (Left) 03/28/2015  . Neurogenic pain 03/28/2015  . Neuropathic pain 03/28/2015  . Myofascial pain 03/28/2015  . History of suicide attempt 03/28/2015  . PTSD (post-traumatic stress disorder) 01/13/2015  . Abnormal gait 12/15/2014  . Congestive  heart failure ([place]) 11/15/2014  . Abdominal wall abscess 09/20/2014  . 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 Diabetes mellitus, type 2 ([place]) 08/13/2014  . Bipolar affective disorder ([place]) 08/13/2014  . Type 2 diabetes mellitus ([place]) 08/13/2014  . Rectal prolapse 08/09/2014  . Rectal bleeding 08/09/2014  . Rectal bleed 08/09/2014  . Affective bipolar disorder ([place]) 08/05/2014  . Arteriosclerosis of coronary artery 08/05/2014  . CCF (congestive cardiac failure) ([place]) 08/05/2014  . Chronic kidney disease 08/05/2014  . 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 Apnea, sleep 08/05/2014  . Temporary cerebral vascular dysfunction 08/05/2014  . Polypharmacy 04/29/2014  . Other long term (current) drug therapy 04/29/2014  . 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 Chronic kidney disease, stage III (moderate) ([place]) 12/14/2013  . Controlled diabetes mellitus type II without complication ([place]) 58/52/7782  . Essential (primary) hypertension 12/03/2013  . Adult hypothyroidism 12/03/2013  . Controlled type 2 diabetes mellitus without complication ([place]) 42/35/3614    Past Surgical History:  Procedure Laterality Date  . ABDOMINAL HYSTERECTOMY    . CARDIAC CATHETERIZATION    . HERNIA REPAIR  07/15/2017   [facility]  . lumbar facet     several  . prolapse rectum surgery N/A July 2016  .  THORACIC LAMINECTOMY FOR SPINAL CORD STIMULATOR N/A 07/27/2019   Procedure: THORACIC SPINAL CORD STIMULATOR PLACEMENT WITH RIGHT FLANK PULSE GENERATOR;  Surgeon: [name], MD;  Location: [facility] ORS;  Service: Neurosurgery;  Laterality: N/A;  . TONSILLECTOMY      Prior to Admission medications   Medication Sig Start Date End Date Taking? Authorizing Provider  Albuterol Sulfate (PROAIR RESPICLICK) 993 (90 Base) MCG/ACT AEPB Inhale 2 puffs into the lungs every 6 (six) hours as needed. 10/26/19   [provider]  baclofen (LIORESAL) 20 MG tablet Take 20 mg by mouth 3 (three) times daily. 03/23/20   [provider]  benztropine (COGENTIN) 0.5 MG tablet Take 1 tablet (0.5 mg total) by mouth 2 (two) times daily as needed for tremors. 01/18/20   Ursula Alert, MD  busPIRone (BUSPAR) 15 MG tablet Take 1 tablet (15 mg total) by mouth 2 (two) times daily. 12/28/19   Ursula Alert, MD  cyclobenzaprine (FLEXERIL) 10 MG tablet Take 10 mg by mouth 3 (three) times daily as needed. 12/17/19   [provider]  desvenlafaxine (PRISTIQ) 100 MG 24 hr tablet TAKE 1 TABLET BY MOUTH ONCE A DAY 04/08/20   Ursula Alert, MD  dicyclomine (BENTYL) 10 MG capsule Take 10 mg by mouth 4 (four) times daily -  before meals and at bedtime. 10/16/19   [provider]  esomeprazole (NEXIUM) 40 MG capsule Take by mouth. 12/23/19 03/29/20  [provider]  fluticasone (FLONASE) 50 MCG/ACT nasal spray Place 2 sprays into both nostrils daily. 06/03/19   [provider]  glipiZIDE (GLUCOTROL XL) 10 MG 24 hr tablet Take 10 mg by mouth daily with breakfast. 11/17/19 11/16/20  [provider]  levocetirizine (XYZAL) 5 MG tablet Take 5 mg by mouth every evening. 01/08/20   [provider]  levothyroxine (SYNTHROID) 175 MCG tablet Take 175 mcg by mouth daily before breakfast. 11/10/19 11/09/20  [provider]  LORazepam (ATIVAN) 0.5 MG tablet Take 1 tablet (0.5 mg total) by mouth as directed. Take 1 tablet 3-4 times a week only for severe panic attacks 01/01/20   Ursula Alert, MD  meloxicam (MOBIC) 7.5 MG tablet Take 7.5 mg by mouth 2 (two) times daily. 02/20/20   [provider]  ondansetron (ZOFRAN-ODT) 8 MG disintegrating tablet Take 8 mg by mouth every 8 (eight) hours as needed. 12/29/19   [provider]  potassium chloride SA (KLOR-CON) 20 MEQ tablet Take 20 mEq by mouth 2 (two) times daily. 12/25/19   [provider]  pregabalin (LYRICA) 150 MG capsule Take 1 capsule (150 mg total) by mouth 3 (three) times daily. Do not take gabapentin (Neurontin) while  taking this medication. 03/04/20 06/02/20  Milinda Pointer, MD  promethazine (PHENERGAN) 25 MG tablet Take 25 mg by mouth every 6 (six) hours as needed. 03/26/20   [provider]  QUEtiapine (SEROQUEL) 100 MG tablet Take 100 mg by mouth at bedtime. 11/25/19   [provider]  QUEtiapine (SEROQUEL) 25 MG tablet Take 25 mg by mouth 2 (two) times daily. Takes one 25mg  with 100 mg at bedtime and 25 mg in the morning    [provider]  rOPINIRole (REQUIP) 1 MG tablet Take 1 mg by mouth 3 (three) times daily. 12/10/19   [provider]    Allergies  Allergen Reactions  . Diazepam Hives and Nausea And Vomiting  . Ziprasidone Hcl Other (See Comments) and Itching    CONFUSED CONFUSED Other reaction(s): Other (See Comments) CONFUSED  CONFUSED   . Azithromycin Hives  . Divalproex Sodium Hives  . Levofloxacin Hives and Rash    1/31: would like to retry since not taking valium  . Lisinopril     Medicine cause kidney failure  . Metronidazole Hives    1/31: would like to retry since she is no longer taking valium  . Sulfa Antibiotics Hives  . Valproic Acid Itching and Rash  . Cephalexin Hives  . Ciprofloxacin Hives  . Penicillins Hives    Has patient had a PCN reaction causing immediate rash, facial/tongue/throat swelling, SOB or lightheadedness with hypotension: No Has patient had a PCN reaction causing severe rash involving mucus membranes or skin necrosis: No Has patient had a PCN reaction that required hospitalization: No Has patient had a PCN reaction occurring within the last 10 years: No If all of the above answers are "NO", then may proceed with Cephalosporin use.     Family History  Problem Relation Age of Onset  . Diabetes Mellitus II Mother   . CAD Mother   . Sleep apnea Mother   . Osteoarthritis Mother   . Osteoporosis Mother   . Anxiety disorder Mother   . Depression Mother   . Bipolar disorder Mother   . Bipolar disorder Father   .  Hypertension Father   . Depression Father   . Anxiety disorder Father   . Post-traumatic stress disorder Sister     Social History Social History   Tobacco Use  . Smoking status: Former Smoker    Packs/day: 0.50    Types: Cigarettes    Quit date: 09/01/2018    Years since quitting: 1.6  . Smokeless tobacco: Never Used  . Tobacco comment: Patient quit smoking 09/01/2018  Vaping Use  . Vaping Use: Never used  Substance Use Topics  . Alcohol use: No    Alcohol/week: 0.0 standard drinks  . Drug use: No    Review of Systems Constitutional: Negative for fever. Cardiovascular: Negative for chest pain. Respiratory: Negative for shortness of breath. Gastrointestinal: Positive for abdominal pain moderate aching pain mostly central and right-sided.  Positive for nausea.  2 days of constipation. Genitourinary: Negative for urinary compaints Musculoskeletal: Complaining of muscle cramping.   Skin: Negative for skin complaints  Neurological: Negative for headache All other ROS negative  ____________________________________________   PHYSICAL EXAM:  VITAL SIGNS: ED Triage Vitals  Enc Vitals Group     BP 04/27/20 2001 113/82     Pulse Rate 04/27/20 2001 100     Resp 04/27/20 2001 16     Temp 04/27/20 2001 99 F (37.2 C)     Temp src --      SpO2 04/27/20 2001 95 %     Weight 04/27/20 2000 225 lb (102.1 kg)     Height 04/27/20 2000 5\' 4"  (1.626 m)     Head Circumference --      Peak Flow --      Pain Score 04/27/20 2000 10     Pain Loc --      Pain Edu? --      Excl. in [place]? --    Constitutional: Alert and oriented. Well appearing and in no distress. Eyes: Normal exam ENT      Head: Normocephalic and atraumatic.      Mouth/Throat: Mucous membranes are moist. Cardiovascular: Normal rate, regular rhythm.  Respiratory: Normal respiratory effort without tachypnea nor retractions. Breath sounds are clear Gastrointestinal: Soft, obese, mild diffuse tenderness mostly around  the right  side of the abdomen.  No rebound guarding or distention.   Musculoskeletal: Nontender with normal range of motion in all extremities. Neurologic:  Normal speech and language. No gross focal neurologic deficits  Skin:  Skin is warm, dry and intact.  Psychiatric: Mood and affect are normal.   ____________________________________________   RADIOLOGY  IMPRESSION:  1. Mild stranding about small bowel loops which are herniated  through the RIGHT paramidline anterior abdominal wall. Appearance is  quite similar to multiple prior studies. The bowel is nondilated on  the current study. Stranding suggests enteritis.  2. Two separate defects in the RIGHT paramidline abdominal wall  adjacent to the umbilicus, complex abdominal hernia as on previous  imaging.  3. Smoothly marginated LEFT adrenal nodule at 1.9 x 1.6 cm. 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 Suggest 1 year follow-up adrenal protocol CT to  document stability and allow for definitive characterization.  4. Mild hepatic steatosis suggested.  5. Signs of prior partial colonic resection in the area of the  sigmoid colon.  6. Spinal nerve stimulator in place as before.  7. Aortic atherosclerosis.   ____________________________________________   INITIAL IMPRESSION / ASSESSMENT AND PLAN / ED COURSE  Pertinent labs & imaging results that were available during my care of the patient were reviewed by me and considered in my medical decision making (see chart for details).   Patient presents emergency department for abdominal pain, constipation, nausea.  Overall the patient appears well, does have mild diffuse tenderness on abdominal exam.  Patient states she has been experiencing leg cramps which are actually more uncomfortable than her abdomen right now per patient.  Patient has a history of a partial small bowel obstruction previously.  Given the patient's symptoms and  abdominal tenderness we will proceed with CT imaging to further evaluate.  Reassuringly patient's lab work is largely within normal limits including a normal white blood cell count.  Reassuring vitals.  We will treat pain nausea and IV hydrate.  Patient CT scan is negative for bowel obstruction, does show signs of possible enteritis.  I have personally reviewed the images and also see stool extending to the ascending colon which could be contributing to her discomfort.  Offered an enema but the patient is refusing at this time states she would rather take something orally at home..  We will reassess after pain and nausea medication, IV hydration.    Marie Clarke was evaluated in Emergency Department on 04/27/2020 for the symptoms described in the history of present illness. She was evaluated in the context of the global COVID-19 pandemic, which necessitated consideration that the patient might be at risk for infection with the SARS-CoV-2 virus that causes COVID-19. Institutional protocols and algorithms that pertain to the evaluation of patients at risk for COVID-19 are in a state of rapid change based on information released by regulatory bodies including the CDC and federal and state organizations. These policies and algorithms were followed during the patient's care in the ED.  ____________________________________________   FINAL CLINICAL IMPRESSION(S) / ED DIAGNOSES  Abdominal pain Constipation Hernia   Harvest Dark, MD 04/29/20 [phone]

## 2020-04-28 DIAGNOSIS — K5900 Constipation, unspecified: Secondary | ICD-10-CM | POA: Diagnosis not present

## 2020-04-28 LAB — URINALYSIS, COMPLETE (UACMP) WITH MICROSCOPIC
Bacteria, UA: NONE SEEN
Bilirubin Urine: NEGATIVE
Glucose, UA: NEGATIVE mg/dL
Hgb urine dipstick: NEGATIVE
Ketones, ur: NEGATIVE mg/dL
Leukocytes,Ua: NEGATIVE
Nitrite: NEGATIVE
Protein, ur: NEGATIVE mg/dL
Specific Gravity, Urine: >1.046 — ABNORMAL HIGH (ref 1.005–1.030)
pH: 5 (ref 5.0–8.0)

## 2020-04-28 MED ORDER — LORAZEPAM 2 MG/ML IJ SOLN
0.5000 mg | Freq: Once | INTRAMUSCULAR | Status: AC
  Administered 2020-04-28: 0.5 mg via INTRAVENOUS
  Filled 2020-04-28: qty 1

## 2020-04-28 MED ORDER — LACTULOSE 10 GM/15ML PO SOLN: 20.0000 g | Freq: Every day | ORAL | 0 refills | Status: DC | PRN

## 2020-04-28 MED ORDER — ONDANSETRON HCL 4 MG PO TABS: 4.0000 mg | ORAL_TABLET | Freq: Three times a day (TID) | ORAL | 0 refills | Status: DC | PRN

## 2020-04-28 NOTE — ED Provider Notes (Incomplete)
-----------------------------------------   12:26 AM on 04/28/2020 -----------------------------------------  Updated patient on UA results.  She is complaining of cramping in her legs.  Takes Requip for restless leg syndrome but feels it does not work for her.  Takes low-dose lorazepam at home.  Will administer low-dose IV.

## 2020-04-28 NOTE — Discharge Instructions (Addendum)
1.  You may take Zofran as needed for nausea. 2.  You may take Lactulose as needed for bowel movements. 3.  Return to the ER for worsening symptoms, persistent vomiting, difficulty breathing or other concerns.

## 2020-05-02 DIAGNOSIS — M546 Pain in thoracic spine: Secondary | ICD-10-CM | POA: Insufficient documentation

## 2020-05-03 ENCOUNTER — Other Ambulatory Visit (HOSPITAL_COMMUNITY): Payer: Self-pay | Admitting: *Deleted

## 2020-05-03 ENCOUNTER — Telehealth (HOSPITAL_COMMUNITY): Payer: Self-pay | Admitting: *Deleted

## 2020-05-03 MED ORDER — METOPROLOL TARTRATE 100 MG PO TABS: ORAL_TABLET | ORAL | 1 refills | Status: DC

## 2020-05-03 NOTE — Telephone Encounter (Signed)
Reaching out to patient to offer assistance regarding upcoming cardiac imaging study; pt verbalizes understanding of appt date/time, but states she will need to re-schedule.  Pt rescheduled to March 31 at 8:45am. After discussing with Dr. Clayborn Bigness and the pt, pt will take 100mg  metoprolol tartrate 2 hours prior to cardiac CT scan.  Pharmacy verified with patient and pt verbalized understanding to pick up medication.  Gordy Clement RN Navigator Cardiac Imaging [facility] Vascular [phone] office [phone] cell

## 2020-05-05 ENCOUNTER — Ambulatory Visit: Admission: RE | Admit: 2020-05-05 | Payer: Medicare Other | Source: Ambulatory Visit

## 2020-05-07 ENCOUNTER — Emergency Department: Payer: Medicare Other

## 2020-05-07 ENCOUNTER — Other Ambulatory Visit: Payer: Self-pay

## 2020-05-07 ENCOUNTER — Emergency Department
Admission: EM | Admit: 2020-05-07 | Discharge: 2020-05-07 | Disposition: A | Discharge status: Home / Self Care | Payer: Medicare Other | Attending: Emergency Medicine | Admitting: Emergency Medicine

## 2020-05-07 DIAGNOSIS — Z87891 Personal history of nicotine dependence: Secondary | ICD-10-CM | POA: Insufficient documentation

## 2020-05-07 DIAGNOSIS — E1122 Type 2 diabetes mellitus with diabetic chronic kidney disease: Secondary | ICD-10-CM | POA: Insufficient documentation

## 2020-05-07 DIAGNOSIS — L03115 Cellulitis of right lower limb: Secondary | ICD-10-CM | POA: Diagnosis not present

## 2020-05-07 DIAGNOSIS — I509 Heart failure, unspecified: Secondary | ICD-10-CM | POA: Diagnosis not present

## 2020-05-07 DIAGNOSIS — J449 Chronic obstructive pulmonary disease, unspecified: Secondary | ICD-10-CM | POA: Insufficient documentation

## 2020-05-07 DIAGNOSIS — Z8673 Personal history of transient ischemic attack (TIA), and cerebral infarction without residual deficits: Secondary | ICD-10-CM | POA: Diagnosis not present

## 2020-05-07 DIAGNOSIS — Z79899 Other long term (current) drug therapy: Secondary | ICD-10-CM | POA: Diagnosis not present

## 2020-05-07 DIAGNOSIS — J45909 Unspecified asthma, uncomplicated: Secondary | ICD-10-CM | POA: Diagnosis not present

## 2020-05-07 DIAGNOSIS — I2510 Atherosclerotic heart disease of native coronary artery without angina pectoris: Secondary | ICD-10-CM | POA: Insufficient documentation

## 2020-05-07 DIAGNOSIS — I130 Hypertensive heart and chronic kidney disease with heart failure and stage 1 through stage 4 chronic kidney disease, or unspecified chronic kidney disease: Secondary | ICD-10-CM | POA: Diagnosis not present

## 2020-05-07 DIAGNOSIS — I80201 Phlebitis and thrombophlebitis of unspecified deep vessels of right lower extremity: Secondary | ICD-10-CM | POA: Diagnosis not present

## 2020-05-07 DIAGNOSIS — N183 Chronic kidney disease, stage 3 unspecified: Secondary | ICD-10-CM | POA: Diagnosis not present

## 2020-05-07 DIAGNOSIS — I809 Phlebitis and thrombophlebitis of unspecified site: Secondary | ICD-10-CM

## 2020-05-07 DIAGNOSIS — E039 Hypothyroidism, unspecified: Secondary | ICD-10-CM | POA: Diagnosis not present

## 2020-05-07 DIAGNOSIS — M79671 Pain in right foot: Secondary | ICD-10-CM | POA: Diagnosis present

## 2020-05-07 MED ORDER — OXYCODONE-ACETAMINOPHEN 5-325 MG PO TABS: 1.0000 | ORAL_TABLET | Freq: Four times a day (QID) | ORAL | 0 refills | Status: DC | PRN

## 2020-05-07 MED ORDER — DOXYCYCLINE HYCLATE 100 MG PO TABS
100.0000 mg | ORAL_TABLET | Freq: Once | ORAL | Status: AC
  Administered 2020-05-07: 100 mg via ORAL
  Filled 2020-05-07: qty 1

## 2020-05-07 MED ORDER — DOXYCYCLINE HYCLATE 100 MG PO CAPS: 100.0000 mg | ORAL_CAPSULE | Freq: Two times a day (BID) | ORAL | 0 refills | Status: DC

## 2020-05-07 MED ORDER — OXYCODONE-ACETAMINOPHEN 5-325 MG PO TABS
1.0000 | ORAL_TABLET | ORAL | Status: AC
Start: 2020-05-07 — End: 2020-05-07
  Administered 2020-05-07: 1 via ORAL
  Filled 2020-05-07: qty 1

## 2020-05-07 NOTE — ED Triage Notes (Signed)
First Nurse Note:  C/O right foot pain x 2 weeks.  Sent to ED for evaluation from [facility].  AAOx3.  Skin warm and dry. NAD

## 2020-05-07 NOTE — ED Triage Notes (Signed)
Pt presents from [facility] c/o right foot pain x2 weeks. Denies injury. Foot very painful to palpation. Pulses palpable.

## 2020-05-07 NOTE — ED Notes (Signed)
[place] tech in room

## 2020-05-07 NOTE — ED Provider Notes (Signed)
[facility] Emergency Department Provider Note ____________________________________________   Event Date/Time   First MD Initiated Contact with Patient 05/07/20 1252     (approximate)  I have reviewed the triage vital signs and the nursing notes.  HISTORY  Chief Complaint Foot Pain  HPI Marie Clarke is a 50 y.o. female the history of multiple medical conditions including depression diabetes, colitis history of remote overdose after death of her father  Patient presents today, reports that for about a week or more she has been experiencing pain on the top of her right foot.  It started a day or 2 after she had an attempt to place an IV in her right foot when she came to the ER for colitis.  Her abdominal symptoms have resolved, but she continues to have pain over the top of the right foot.   No numbness or weakness in the foot.  Pain is only over the bridge of the foot she is noticing to be slightly red and felt like she had a slight fever with that yesterday.  Past Medical History:  Diagnosis Date  . Anemia   . Anginal pain ([place])   . Anxiety   . Arthritis   . Asthma   . Bilateral lower extremity edema   . Bipolar disorder ([place])   . CAD (coronary artery disease) unk  . CHF (congestive heart failure) ([place])   . Chronic pain syndrome   . COPD (chronic obstructive pulmonary disease) (HCC)    emphysema  . DDD (degenerative disc disease), lumbar   . Depression unk  . Diabetes mellitus without complication ([place])   . Diabetes mellitus, type II ([place])   . Drug overdose 05-27-2013   after dad died  . Dysrhythmia    history of tachy arrythmias  . Fibromyalgia   . GERD (gastroesophageal reflux disease)   . Headache    chronic migraines  . Hyperlipidemia   . Hypertension   . Hypothyroidism   . Left leg pain 04/29/2014  . MI (myocardial infarction) ([place]) 05/27/2005  . Muscle ache 09/16/2014  . Osteoporosis   . Overactive bladder   . Pancreatitis unk  . Panic attacks    . PTSD (post-traumatic stress disorder)   . Reflex sympathetic dystrophy   . Renal cyst, left   . Renal insufficiency    ckd stage iii  . Restless legs syndrome   . Sleep apnea 06/2019   uses cpap  . Sleep apnea   . Stroke ([place]) May 27, 2008   TIA x 2. no residual deficits  . Suicide attempt [facility])    after father died.  . Thyroid disease    thyroid nodule  . TIA (transient ischemic attack) unk  . TIA (transient ischemic attack)     Patient Active Problem List   Diagnosis Date Noted  . Partial small bowel obstruction ([place]) 03/29/2020  . Hypokalemia   . SBO (small bowel obstruction) ([place]) 12/02/2019  . Chronic pain 12/02/2019  . Presence of neurostimulator (SCS) (June 2021) 10/06/2019  . Pain due to any device, implant or graft 09/21/2019  . Anxiety 09/10/2019  . Involuntary movements 09/10/2019  . Abnormal involuntary movement 08/18/2019  . Bipolar disorder, in full remission, most recent episode mixed ([place]) 08/11/2019  . Lumbosacral radiculopathy at L5 (Right) 04/09/2019  . Chronic migraine 03/12/2019  . Gout 01/21/2019  . Noncompliance with treatment regimen 12/29/2018  . BMI 40.0-44.9, adult ([place]) 11/19/2018  . AKI (acute kidney injury) ([place]) 11/17/2018  . Postural dizziness with  presyncope 09/29/2018  . Bipolar I disorder, most recent episode mixed ([place]) 09/10/2018  . Panic attacks 09/10/2018  . Chronic lumbosacral L5-S1 IVD protrusion (Bilateral) 08/25/2018  . Lumbar lateral recess stenosis (L5-S1) (Bilateral) 08/25/2018  . Wound disruption 08/05/2018  . Osteoarthritis involving multiple joints 07/10/2018  . Long term current use of non-steroidal anti-inflammatories (NSAID) 07/10/2018  . NSAID induced gastritis 07/10/2018  . DDD (degenerative disc disease), lumbosacral 04/22/2018  . Disease related peripheral neuropathy 11/13/2017  . Gastroesophageal reflux disease without esophagitis 11/13/2017  . Occipital headache (Bilateral) 11/13/2017  . Cervicogenic headache  (Bilateral) (L>R) 11/13/2017  . History of postoperative nausea 10/22/2017  . Chronic shoulder pain (5th area of Pain) (Bilateral) (L>R) 10/09/2017  . Ankle joint instability (Left) 10/09/2017  . Ankle sprain, sequela (Left) 10/09/2017  . History of psychiatric symptoms 10/09/2017  . Chronic pain syndrome 10/02/2017  . Spondylosis without myelopathy or radiculopathy, lumbosacral region 10/02/2017  . Chronic musculoskeletal pain 10/02/2017  . Elevated C-reactive protein (CRP) 09/10/2017  . Elevated sed rate 09/10/2017  . Chronic neck pain (4th area of Pain) (Bilateral) (L>R) 09/09/2017  . Pharmacologic therapy 09/09/2017  . Disorder of skeletal system 09/09/2017  . Problems influencing health status 09/09/2017  . Long term current use of opiate analgesic 09/09/2017  . Tobacco use disorder 07/16/2017  . Ventral hernia without obstruction or gangrene 06/25/2017  . Strain of extensor muscle, fascia and tendon of left index finger at wrist and hand level, initial encounter 05/16/2017  . Sepsis ([place]) 04/14/2017  . Osteopenia 04/03/2017  . Hypotension 09/17/2016  . Contusion of knee (Left) 10/12/2015  . Strain of knee (Left) 10/12/2015  . Incidental lung nodule 04/28/2015  . Chronic low back pain (1ry area of Pain) (Bilateral) (L>R) 03/28/2015  . Chronic lower extremity pain (Referred) (2ry area of Pain) (Left) 03/28/2015  . Abdominal wound dehiscence 03/28/2015  . Encounter for pain management planning 03/28/2015  . Morbid obesity ([place]) 03/28/2015  . Abnormal CT scan, lumbar spine 03/28/2015  . Lumbar facet hypertrophy 03/28/2015  . Lumbar facet syndrome (Bilateral) (L>R) 03/28/2015  . Lumbar foraminal stenosis (Bilateral) (L5-S1) 03/28/2015  . Chronic ankle pain (3ry area of Pain) (Left) 03/28/2015  . Neurogenic pain 03/28/2015  . Neuropathic pain 03/28/2015  . Myofascial pain 03/28/2015  . History of suicide attempt 03/28/2015  . PTSD (post-traumatic stress disorder) 01/13/2015  .  Abnormal gait 12/15/2014  . Congestive heart failure ([place]) 11/15/2014  . Abdominal wall abscess 09/20/2014  . 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 Diabetes mellitus, type 2 ([place]) 08/13/2014  . Bipolar affective disorder ([place]) 08/13/2014  . Type 2 diabetes mellitus ([place]) 08/13/2014  . Rectal prolapse 08/09/2014  . Rectal bleeding 08/09/2014  . Rectal bleed 08/09/2014  . Affective bipolar disorder ([place]) 08/05/2014  . Arteriosclerosis of coronary artery 08/05/2014  . CCF (congestive cardiac failure) ([place]) 08/05/2014  . Chronic kidney disease 08/05/2014  . 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 Apnea, sleep 08/05/2014  . Temporary cerebral vascular dysfunction 08/05/2014  . Polypharmacy 04/29/2014  . Other long term (current) drug therapy 04/29/2014  . 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 Chronic kidney disease, stage III (moderate) ([place]) 12/14/2013  . Controlled diabetes mellitus type II without complication ([place]) 49/82/6415  . Essential (primary) hypertension 12/03/2013  . Adult hypothyroidism 12/03/2013  . Controlled type 2 diabetes mellitus without complication ([place]) 83/10/4074    Past Surgical History:  Procedure Laterality Date  . ABDOMINAL HYSTERECTOMY    . CARDIAC CATHETERIZATION    . HERNIA REPAIR  07/15/2017  [facility]  . lumbar facet     several  . prolapse rectum surgery N/A July 2016  . THORACIC LAMINECTOMY FOR SPINAL CORD STIMULATOR N/A 07/27/2019   Procedure: THORACIC SPINAL CORD STIMULATOR PLACEMENT WITH RIGHT FLANK PULSE GENERATOR;  Surgeon: [name], MD;  Location: [facility] ORS;  Service: Neurosurgery;  Laterality: N/A;  . TONSILLECTOMY      Prior to Admission medications   Medication Sig Start Date End Date Taking? Authorizing Provider  doxycycline (VIBRAMYCIN) 100 MG capsule Take 1 capsule (100 mg total) by mouth 2 (two) times daily. 05/07/20  Yes Delman Kitten, MD  oxyCODONE-acetaminophen (PERCOCET/ROXICET) 5-325 MG tablet Take 1 tablet by mouth every 6 (six)  hours as needed for severe pain. 05/07/20  Yes Delman Kitten, MD  Albuterol Sulfate (PROAIR RESPICLICK) 627 (90 Base) MCG/ACT AEPB Inhale 2 puffs into the lungs every 6 (six) hours as needed. 10/26/19   [provider]  baclofen (LIORESAL) 20 MG tablet Take 20 mg by mouth 3 (three) times daily. 03/23/20   [provider]  benztropine (COGENTIN) 0.5 MG tablet Take 1 tablet (0.5 mg total) by mouth 2 (two) times daily as needed for tremors. 01/18/20   Ursula Alert, MD  busPIRone (BUSPAR) 15 MG tablet Take 1 tablet (15 mg total) by mouth 2 (two) times daily. 12/28/19   Ursula Alert, MD  cyclobenzaprine (FLEXERIL) 10 MG tablet Take 10 mg by mouth 3 (three) times daily as needed. 12/17/19   [provider]  desvenlafaxine (PRISTIQ) 100 MG 24 hr tablet TAKE 1 TABLET BY MOUTH ONCE A DAY 04/08/20   Ursula Alert, MD  dicyclomine (BENTYL) 10 MG capsule Take 10 mg by mouth 4 (four) times daily -  before meals and at bedtime. 10/16/19   [provider]  esomeprazole (NEXIUM) 40 MG capsule Take by mouth. 12/23/19 03/29/20  [provider]  fluticasone (FLONASE) 50 MCG/ACT nasal spray Place 2 sprays into both nostrils daily. 06/03/19   [provider]  glipiZIDE (GLUCOTROL XL) 10 MG 24 hr tablet Take 10 mg by mouth daily with breakfast. 11/17/19 11/16/20  [provider]  lactulose (CHRONULAC) 10 GM/15ML solution Take 30 mLs (20 g total) by mouth daily as needed for mild constipation. 04/28/20   Paulette Blanch, MD  levocetirizine (XYZAL) 5 MG tablet Take 5 mg by mouth every evening. 01/08/20   [provider]  levothyroxine (SYNTHROID) 175 MCG tablet Take 175 mcg by mouth daily before breakfast. 11/10/19 11/09/20  [provider]  LORazepam (ATIVAN) 0.5 MG tablet Take 1 tablet (0.5 mg total) by mouth as directed. Take 1 tablet 3-4 times a week only for severe panic attacks 01/01/20   Ursula Alert, MD  meloxicam (MOBIC) 7.5 MG tablet Take 7.5 mg  by mouth 2 (two) times daily. 02/20/20   [provider]  metoprolol tartrate (LOPRESSOR) 100 MG tablet Take 1 tablet (100mg ) by mouth 2 hours prior to cardiac CT. 05/03/20   [place], Dwayne D, MD  ondansetron (ZOFRAN) 4 MG tablet Take 1 tablet (4 mg total) by mouth every 8 (eight) hours as needed for nausea or vomiting. 04/28/20   Paulette Blanch, MD  ondansetron (ZOFRAN-ODT) 8 MG disintegrating tablet Take 8 mg by mouth every 8 (eight) hours as needed. 12/29/19   [provider]  potassium chloride SA (KLOR-CON) 20 MEQ tablet Take 20 mEq by mouth 2 (two) times daily. 12/25/19   [provider]  pregabalin (LYRICA) 150 MG capsule Take 1 capsule (150 mg total)  by mouth 3 (three) times daily. Do not take gabapentin (Neurontin) while taking this medication. 03/04/20 06/02/20  Milinda Pointer, MD  promethazine (PHENERGAN) 25 MG tablet Take 25 mg by mouth every 6 (six) hours as needed. 03/26/20   [provider]  QUEtiapine (SEROQUEL) 100 MG tablet Take 100 mg by mouth at bedtime. 11/25/19   [provider]  QUEtiapine (SEROQUEL) 25 MG tablet Take 25 mg by mouth 2 (two) times daily. Takes one 25mg  with 100 mg at bedtime and 25 mg in the morning    [provider]  rOPINIRole (REQUIP) 1 MG tablet Take 1 mg by mouth 3 (three) times daily. 12/10/19   [provider]    Allergies Diazepam, Ziprasidone hcl, Azithromycin, Divalproex sodium, Levofloxacin, Lisinopril, Metronidazole, Sulfa antibiotics, Valproic acid, Cephalexin, Ciprofloxacin, and Penicillins  Family History  Problem Relation Age of Onset  . Diabetes Mellitus II Mother   . CAD Mother   . Sleep apnea Mother   . Osteoarthritis Mother   . Osteoporosis Mother   . Anxiety disorder Mother   . Depression Mother   . Bipolar disorder Mother   . Bipolar disorder Father   . Hypertension Father   . Depression Father   . Anxiety disorder Father   . Post-traumatic stress disorder Sister      Social History Social History   Tobacco Use  . Smoking status: Former Smoker    Packs/day: 0.50    Types: Cigarettes    Quit date: 09/01/2018    Years since quitting: 1.6  . Smokeless tobacco: Never Used  . Tobacco comment: Patient quit smoking 09/01/2018  Vaping Use  . Vaping Use: Never used  Substance Use Topics  . Alcohol use: No    Alcohol/week: 0.0 standard drinks  . Drug use: No    Review of Systems Constitutional: No chills felt like she had a slight fever yesterday Eyes: No visual changes. ENT: No sore throat. Cardiovascular: Denies chest pain. Respiratory: Denies shortness of breath. Gastrointestinal: No abdominal pain.  This is improved from previous Genitourinary: Negative for dysuria. Musculoskeletal: Negative for back pain.  See HPI Skin: Negative for rash. Neurological: Negative for headaches    Went to urgent care they recommended she come to have an ultrasound of the right leg to rule out a blood clot ____________________________________________   PHYSICAL EXAM:  VITAL SIGNS: ED Triage Vitals  Enc Vitals Group     BP 05/07/20 1246 103/68     Pulse Rate 05/07/20 1246 97     Resp 05/07/20 1246 14     Temp 05/07/20 1246 98.4 F (36.9 C)     Temp Source 05/07/20 1246 Oral     SpO2 05/07/20 1246 97 %     Weight --      Height --      Head Circumference --      Peak Flow --      Pain Score 05/07/20 1245 10     Pain Loc --      Pain Edu? --      Excl. in [place]? --     Constitutional: Alert and oriented. Well appearing and in no acute distress. Eyes: Conjunctivae are normal. Head: Atraumatic. Nose: No congestion/rhinnorhea. Mouth/Throat: Mucous membranes are moist. Neck: No stridor.  Cardiovascular: Normal rate, regular rhythm. Grossly normal heart sounds.  Good peripheral circulation. Respiratory: Normal respiratory effort.  No retractions. Lungs CTAB. Gastrointestinal: Soft and nontender. No distention. Musculoskeletal:  Lower  Extremities  No edema. Normal DP/PT  pulses bilateral with good cap refill.  Normal neuro-motor function lower extremities bilateral.  RIGHT Right lower extremity demonstrates normal strength, good use of all muscles except for some limitations of plantar and dorsiflexion of the right foot due to pain over the bridge of the right foot. No edema bruising or contusions of the right hip, right knee, right ankle.  Patient has a small area about the size of a silver dollar over the bridge of the right foot dorsally.  Reports pain and tenderness there and some very minimal edema.  The area does have some slight warmth and erythema as well.  She reports this is the location of an IV start about a week or 2 ago when she was last in the ER.  There is no erythema extending up in the leg or down the foot.  There are no venous cords or congestion.  LEFT Left lower extremity demonstrates normal strength, good use of all muscles. No edema bruising or contusions of the hip,  knee, ankle. Full range of motion of the left lower extremity without pain. No pain on axial loading. No evidence of trauma.   Neurologic:  Normal speech and language. No gross focal neurologic deficits are appreciated.  Skin:  Skin is warm, dry and intact. No rash noted. Psychiatric: Mood and affect are normal. Speech and behavior are normal.  ____________________________________________   LABS (all labs ordered are listed, but only abnormal results are displayed)  Labs Reviewed - No data to display ____________________________________________  EKG   ____________________________________________  RADIOLOGY  US Venous Img Lower Unilateral Right  Result Date: 05/07/2020 CLINICAL DATA:  50 year old female with RIGHT LOWER extremity pain. EXAM: RIGHT LOWER EXTREMITY VENOUS DOPPLER ULTRASOUND TECHNIQUE: Gray-scale sonography with compression, as well as color and duplex ultrasound, were performed to evaluate the deep venous  system(s) from the level of the common femoral vein through the popliteal and proximal calf veins. COMPARISON:  None. FINDINGS: VENOUS Normal compressibility of the common femoral, superficial femoral, and popliteal veins, as well as the visualized calf veins. Visualized portions of profunda femoral vein and great saphenous vein unremarkable. No filling defects to suggest DVT on grayscale or color Doppler imaging. Doppler waveforms show normal direction of venous flow, normal respiratory plasticity and response to augmentation. Limited views of the contralateral common femoral vein are unremarkable. OTHER None. Limitations: none IMPRESSION: Negative. Electronically Signed   By: Margarette Canada M.D.   On: 05/07/2020 13:53    Imaging reviewed negative for DVT right lower extremity ____________________________________________   PROCEDURES  Procedure(s) performed: None  Procedures  Critical Care performed: No  ____________________________________________   INITIAL IMPRESSION / ASSESSMENT AND PLAN / ED COURSE  Pertinent labs & imaging results that were available during my care of the patient were reviewed by me and considered in my medical decision making (see chart for details).   Patient presents for evaluation pain tenderness left slight erythema over the bridge of the right foot, consistent with phlebitis but given her report of a slight fever yesterday as well as its warmth and slight erythema I am also considering the possibility of cellulitis.  No evidence of sepsis by clinical history or exam.  Will place patient on doxycycline, also discussed additional treatments for phlebitis such as elevation and rest, follow-up with her primary care physician.  No evidence to suggest DVT or acute complication.  No redness or swelling involving any particular joint location.  No history of trauma  I will prescribe the patient a narcotic  pain medicine due to their condition which I anticipate will cause at  least moderate pain short term. I discussed with the patient safe use of narcotic pain medicines, and that they are not to drive, work in dangerous areas, or ever take more than prescribed (no more than 1 pill every 6 hours). We discussed that this is the type of medication that can be  overdosed on and the risks of this type of medicine. Patient is very agreeable to only use as prescribed and to never use more than prescribed.  I was very specific with the patient.  She does have a recent prescription for just a couple of oxycodone tablets that she reports she only has 1 left from [facility].  She does have tenderness and evidence of probably phlebitis over the top of the right foot, uncomfortable prescribing a very brief very small prescription of oxycodone for her.  She reports that she will not take her Ativan within 8 hours of use of oxycodone, agrees to not mixing this medication with oxycodone, and not to use any more than ever prescribed.  She does not drive while taking medication such as this either.  [place] prescriber database reviewed as well      ____________________________________________   FINAL CLINICAL IMPRESSION(S) / ED DIAGNOSES  Final diagnoses:  Phlebitis  Cellulitis of right foot        Note:  This document was prepared using Dragon voice recognition software and may include unintentional dictation errors       Delman Kitten, MD 05/07/20 1638

## 2020-05-10 ENCOUNTER — Ambulatory Visit (INDEPENDENT_AMBULATORY_CARE_PROVIDER_SITE_OTHER): Payer: Medicare Other | Admitting: Nurse Practitioner

## 2020-05-10 ENCOUNTER — Other Ambulatory Visit: Payer: Self-pay

## 2020-05-10 ENCOUNTER — Encounter (INDEPENDENT_AMBULATORY_CARE_PROVIDER_SITE_OTHER): Payer: Self-pay | Admitting: Nurse Practitioner

## 2020-05-10 VITALS — BP 124/79 | HR 94 | Resp 16 | Ht 64.0 in | Wt 237.0 lb

## 2020-05-10 DIAGNOSIS — I10 Essential (primary) hypertension: Secondary | ICD-10-CM | POA: Diagnosis not present

## 2020-05-10 DIAGNOSIS — I890 Lymphedema, not elsewhere classified: Secondary | ICD-10-CM

## 2020-05-10 NOTE — Consult Note (Signed)
CARDIOLOGY CONSULT NOTE               Patient ID: Marie Clarke MRN: [id number] DOB/AGE: May 14, 1970 50 y.o.  Admit date: 12/26/2019 Referring Physician Dr. Twana First general surgery Primary Physician Salome Holmes MD primary Primary Cardiologist [facility]  Reason for Consultation preop clearance small bowel obstruction  HPI: Patient is a 50 year old obese female history of diabetes hypertension chronic renal insufficiency abdominal pain vomiting colicky abdominal periumbilical pain patient was found to have small bowel obstruction evaluated by general surgery cardiology was consulted for preoperative clearance because of previous cardiac problems.  Patient denies any chest pain complains of lower extremity edema.  Patient feels better now with nasogastric tube decompression.  Preop for small bowel obstruction  Review of systems complete and found to be negative unless listed above     Past Medical History:  Diagnosis Date  . Anemia   . Anginal pain ([place])   . Anxiety   . Arthritis   . Asthma   . Bilateral lower extremity edema   . Bipolar disorder ([place])   . CAD (coronary artery disease) unk  . CHF (congestive heart failure) ([place])   . Chronic pain syndrome   . COPD (chronic obstructive pulmonary disease) (H[facility])    emphysema  . DDD (degenerative disc disease), lumbar   . Depression unk  . Diabetes mellitus without complication ([place])   . Diabetes mellitus, type II ([employer])   . Drug overdose 24-May-2013   after dad died  . Dysrhythmia    history of tachy arrythmias  . Fibromyalgia   . GERD (gastroesophageal reflux disease)   . Headache    chronic migraines  . Hyperlipidemia   . Hypertension   . Hypothyroidism   . Left leg pain 04/29/2014  . MI (myocardial infarction) ([place]) 2005-05-24  . Muscle ache 09/16/2014  . Osteoporosis   . Overactive bladder   . Pancreatitis unk  . Panic attacks   . PTSD (post-traumatic stress disorder)   . Reflex sympathetic dystrophy   . Renal cyst, left   .  Renal insufficiency    ckd stage iii  . Restless legs syndrome   . Sleep apnea 06/2019   uses cpap  . Sleep apnea   . Stroke ([place]) 05/24/08   TIA x 2. no residual deficits  . Suicide attempt [facility])    after father died.  . Thyroid disease    thyroid nodule  . TIA (transient ischemic attack) unk  . TIA (transient ischemic attack)     Past Surgical History:  Procedure Laterality Date  . ABDOMINAL HYSTERECTOMY    . CARDIAC CATHETERIZATION    . HERNIA REPAIR  07/15/2017   [facility]  . lumbar facet     several  . prolapse rectum surgery N/A July 2016  . THORACIC LAMINECTOMY FOR SPINAL CORD STIMULATOR N/A 07/27/2019   Procedure: THORACIC SPINAL CORD STIMULATOR PLACEMENT WITH RIGHT FLANK PULSE GENERATOR;  Surgeon: [name], MD;  Location: [facility] ORS;  Service: Neurosurgery;  Laterality: N/A;  . TONSILLECTOMY      No medications prior to admission.   Social History   Socioeconomic History  . Marital status: Single    Spouse name: Not on file  . Number of children: 0  . Years of education: Not on file  . Highest education level: High school graduate  Occupational History    Comment: not employed  Tobacco Use  . Smoking status: Former Smoker    Packs/day: 0.50    Types: Cigarettes  Quit date: 09/01/2018    Years since quitting: 1.6  . Smokeless tobacco: Never Used  . Tobacco comment: Patient quit smoking 09/01/2018  Vaping Use  . Vaping Use: Never used  Substance and Sexual Activity  . Alcohol use: No    Alcohol/week: 0.0 standard drinks  . Drug use: No  . Sexual activity: Not Currently  Other Topics Concern  . Not on file  Social History Narrative   Patient lives with aunt and uncle.   Social Determinants of Health   Financial Resource Strain: Not on file  Food Insecurity: Not on file  Transportation Needs: Not on file  Physical Activity: Not on file  Stress: Not on file  Social Connections: Not on file  Intimate Partner Violence: Not on file    Family History   Problem Relation Age of Onset  . Diabetes Mellitus II Mother   . CAD Mother   . Sleep apnea Mother   . Osteoarthritis Mother   . Osteoporosis Mother   . Anxiety disorder Mother   . Depression Mother   . Bipolar disorder Mother   . Bipolar disorder Father   . Hypertension Father   . Depression Father   . Anxiety disorder Father   . Post-traumatic stress disorder Sister       Review of systems complete and found to be negative unless listed above      PHYSICAL EXAM  General: Well developed, well nourished, in no acute distress HEENT:  Normocephalic and atramatic Neck:  No JVD.  Lungs: Clear bilaterally to auscultation and percussion. Heart: HRRR . Normal S1 and S2 without gallops or murmurs.  Abdomen: Bowel sounds are positive, abdomen soft and non-tender  Msk:  Back normal, normal gait. Normal strength and tone for age. Extremities: No clubbing, cyanosis or edema.   Neuro: Alert and oriented X 3. Psych:  Good affect, responds appropriately  Labs:   Lab Results  Component Value Date   WBC 9.2 04/27/2020   HGB 12.6 04/27/2020   HCT 38.8 04/27/2020   MCV 88.0 04/27/2020   PLT 277 04/27/2020   No results for input(s): NA, K, CL, CO2, BUN, CREATININE, CALCIUM, PROT, BILITOT, ALKPHOS, ALT, AST, GLUCOSE in the last 168 hours.  Invalid input(s): LABALBU Lab Results  Component Value Date   TROPONINI <0.03 06/08/2018   No results found for: CHOL No results found for: HDL No results found for: LDLCALC No results found for: TRIG No results found for: CHOLHDL No results found for: LDLDIRECT    Radiology: CT ABDOMEN PELVIS W CONTRAST  Addendum Date: 04/27/2020   ADDENDUM REPORT: 04/27/2020 22:24 ADDENDUM: Visualized portions of the appendix are unchanged. The appendix is not seen in its entirety. There are no secondary signs to suggest appendicitis. Distal appendix was quite diminutive on previous imaging Electronically Signed   By: Zetta Bills M.D.   On: 04/27/2020  22:24   Result Date: 04/27/2020 CLINICAL DATA:  Acute abdominal pain. Partial small bowel obstruction in February. EXAM: CT ABDOMEN AND PELVIS WITH CONTRAST TECHNIQUE: Multidetector CT imaging of the abdomen and pelvis was performed using the standard protocol following bolus administration of intravenous contrast. CONTRAST:  167mL OMNIPAQUE IOHEXOL 300 MG/ML  SOLN COMPARISON:  March 29, 2020 FINDINGS: Lower chest: Lingular scarring. No consolidation. No pleural effusion. Hepatobiliary: Normal appearance of hepatic contour. Mild hepatic steatosis suggested. Portal vein is patent. No pericholecystic stranding. No biliary duct dilation. Pancreas: Normal, without mass, inflammation or ductal dilatation. Spleen: Spleen normal size and contour. Adrenals/Urinary  Tract: Smoothly marginated LEFT adrenal nodule at 1.9 x 1.6 cm. RIGHT adrenal gland is normal. Symmetric renal enhancement. No hydronephrosis. Urinary bladder with smooth contours. Stomach/Bowel: Stranding about small bowel loops which are herniated through the RIGHT paramidline anterior abdominal wall. These are not as dilated as on previous images. There are 2 separate defects in the RIGHT paramidline abdominal wall adjacent to the umbilicus. Intra-abdominal bowel is not obstructed at this time. Signs of prior partial colonic resection in the area of the sigmoid colon. Vascular/Lymphatic: Smooth contour of the abdominal aorta. No aneurysmal dilation. There is no gastrohepatic or hepatoduodenal ligament lymphadenopathy. No retroperitoneal or mesenteric lymphadenopathy. No pelvic sidewall lymphadenopathy. Reproductive: Post hysterectomy.  No adnexal masses. Other: Spinal nerve stimulator in place as before, power pack over the RIGHT flank posteriorly in the upper gluteal region Musculoskeletal: No acute musculoskeletal process. Spinal degenerative changes. IMPRESSION: 1. Mild stranding about small bowel loops which are herniated through the RIGHT paramidline  anterior abdominal wall. Appearance is quite similar to multiple prior studies. The bowel is nondilated on the current study. Stranding suggests enteritis. 2. Two separate defects in the RIGHT paramidline abdominal wall adjacent to the umbilicus, complex abdominal hernia as on previous imaging. 3. Smoothly marginated LEFT adrenal nodule at 1.9 x 1.6 cm. 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 Suggest 1 year follow-up adrenal protocol CT to document stability and allow for definitive characterization. 4. Mild hepatic steatosis suggested. 5. Signs of prior partial colonic resection in the area of the sigmoid colon. 6. Spinal nerve stimulator in place as before. 7. Aortic atherosclerosis. Aortic Atherosclerosis (ICD10-I70.0). Electronically Signed: By: Zetta Bills M.D. On: 04/27/2020 21:33   US Venous Img Lower Unilateral Right  Result Date: 05/07/2020 CLINICAL DATA:  50 year old female with RIGHT LOWER extremity pain. EXAM: RIGHT LOWER EXTREMITY VENOUS DOPPLER ULTRASOUND TECHNIQUE: Gray-scale sonography with compression, as well as color and duplex ultrasound, were performed to evaluate the deep venous system(s) from the level of the common femoral vein through the popliteal and proximal calf veins. COMPARISON:  None. FINDINGS: VENOUS Normal compressibility of the common femoral, superficial femoral, and popliteal veins, as well as the visualized calf veins. Visualized portions of profunda femoral vein and great saphenous vein unremarkable. No filling defects to suggest DVT on grayscale or color Doppler imaging. Doppler waveforms show normal direction of venous flow, normal respiratory plasticity and response to augmentation. Limited views of the contralateral common femoral vein are unremarkable. OTHER None. Limitations: none IMPRESSION: Negative. Electronically Signed   By: Margarette Canada M.D.   On: 05/07/2020 13:53    EKG: Normal sinus rhythm nonspecific ST-T  wave changes  ASSESSMENT AND PLAN:  Preop clearance Small bowel obstruction Obesity Asthma Hypertension GERD Obstructive sleep apnea TIA COPD Coronary artery disease Bilateral lower extremity edema . Plan Patient appears to be an acceptable surgical risk for small bowel obstruction Continue current medical therapy Maintain current therapy for anxiety Conservative management for non-insulin-dependent diabetes Recommend weight loss exercise portion control when able Continue hypertension management And the patient follow-up with cardiology as an outpatient   Signed: Yolonda Kida MD 05/10/2020, 2:45 PM

## 2020-05-12 ENCOUNTER — Telehealth (INDEPENDENT_AMBULATORY_CARE_PROVIDER_SITE_OTHER): Payer: Self-pay | Admitting: Nurse Practitioner

## 2020-05-12 ENCOUNTER — Ambulatory Visit: Admission: RE | Admit: 2020-05-12 | Payer: Medicare Other | Source: Ambulatory Visit

## 2020-05-12 NOTE — Telephone Encounter (Signed)
Patient was made aware that [employer] is the company for lymphedema pump

## 2020-05-12 NOTE — Telephone Encounter (Signed)
Patient left voicemail requesting the name of the company leg pump was ordered through?  She was seen on 05/10/2020 9am with Arna Medici. Please advise

## 2020-05-15 ENCOUNTER — Encounter (INDEPENDENT_AMBULATORY_CARE_PROVIDER_SITE_OTHER): Payer: Self-pay | Admitting: Nurse Practitioner

## 2020-05-15 NOTE — Progress Notes (Signed)
Subjective:    Patient ID: Marie Clarke, female    DOB: 01-Aug-1970, 50 y.o.   MRN: [id number] Chief Complaint  Patient presents with  . New Patient (Initial Visit)    Ref [place] ble edema    Marie Clarke is a 50 year old female that is seen for evaluation of leg swelling.  The patient has had leg swelling that has been going on for years.  She has taken diuretics to help with leg swelling however they have not been inherently helpful.  The patient has worn medical grade 1 compression stockings for several months.  These compression stockings do help with swelling but they do not eliminate it completely.  Patient also uses elevation in addition to exercise but despite all of these conservative therapy tactics the patient still continues to have lower extremity edema which can be painful.  Dependency can also make swelling worse.  The patient notes that the legs are swollen at the end of the evening but better in the morning after a week.  The patient has no had any past angiography, interventions or vascular surgery.  The patient denies a history of DVT or PE. There is no prior history of phlebitis. There is no history of primary lymphedema.  There is no history of radiation treatment to the groin or pelvis No history of malignancies. No history of trauma or groin or pelvic surgery. No history of foreign travel or parasitic infections area    Review of Systems  Cardiovascular: Positive for leg swelling.  All other systems reviewed and are negative.      Objective:   Physical Exam Vitals reviewed.  Cardiovascular:     Rate and Rhythm: Normal rate.     Pulses: Normal pulses.  Pulmonary:     Effort: Pulmonary effort is normal.  Musculoskeletal:     Right lower leg: 2+ Edema present.     Left lower leg: 2+ Edema present.  Skin:    Comments: Dermal thickening  Neurological:     Mental Status: She is alert and oriented to person, place, and time.  Psychiatric:        Mood and  Affect: Mood normal.        Behavior: Behavior normal.        Thought Content: Thought content normal.        Judgment: Judgment normal.     BP 124/79 (BP Location: Right Arm)   Pulse 94   Resp 16   Ht 5\' 4"  (1.626 m)   Wt 237 lb (107.5 kg)   LMP  (LMP Unknown)   BMI 40.68 kg/m   Past Medical History:  Diagnosis Date  . Anemia   . Anginal pain ([place])   . Anxiety   . Arthritis   . Asthma   . Bilateral lower extremity edema   . Bipolar disorder ([place])   . CAD (coronary artery disease) unk  . CHF (congestive heart failure) ([place])   . Chronic pain syndrome   . COPD (chronic obstructive pulmonary disease) ([facility])    emphysema  . DDD (degenerative disc disease), lumbar   . Depression unk  . Diabetes mellitus without complication ([place])   . Diabetes mellitus, type II ([place])   . Drug overdose May 12, 2013   after dad died  . Dysrhythmia    history of tachy arrythmias  . Fibromyalgia   . GERD (gastroesophageal reflux disease)   . Headache    chronic migraines  . Hyperlipidemia   . Hypertension   .  Hypothyroidism   . Left leg pain 04/29/2014  . MI (myocardial infarction) ([place]) 2007  . Muscle ache 09/16/2014  . Osteoporosis   . Overactive bladder   . Pancreatitis unk  . Panic attacks   . PTSD (post-traumatic stress disorder)   . Reflex sympathetic dystrophy   . Renal cyst, left   . Renal insufficiency    ckd stage iii  . Restless legs syndrome   . Sleep apnea 06/2019   uses cpap  . Sleep apnea   . Stroke ([place]) 2010   TIA x 2. no residual deficits  . Suicide attempt [facility])    after father died.  . Thyroid disease    thyroid nodule  . TIA (transient ischemic attack) unk  . TIA (transient ischemic attack)     Social History   Socioeconomic History  . Marital status: Single    Spouse name: Not on file  . Number of children: 0  . Years of education: Not on file  . Highest education level: High school graduate  Occupational History    Comment: not employed  Tobacco Use   . Smoking status: Former Smoker    Packs/day: 0.50    Types: Cigarettes    Quit date: 09/01/2018    Years since quitting: 1.7  . Smokeless tobacco: Never Used  . Tobacco comment: Patient quit smoking 09/01/2018  Vaping Use  . Vaping Use: Never used  Substance and Sexual Activity  . Alcohol use: No    Alcohol/week: 0.0 standard drinks  . Drug use: No  . Sexual activity: Not Currently  Other Topics Concern  . Not on file  Social History Narrative   Patient lives with aunt and uncle.   Social Determinants of Health   Financial Resource Strain: Not on file  Food Insecurity: Not on file  Transportation Needs: Not on file  Physical Activity: Not on file  Stress: Not on file  Social Connections: Not on file  Intimate Partner Violence: Not on file    Past Surgical History:  Procedure Laterality Date  . ABDOMINAL HYSTERECTOMY    . CARDIAC CATHETERIZATION    . HERNIA REPAIR  07/15/2017   [facility]  . lumbar facet     several  . prolapse rectum surgery N/A July 2016  . THORACIC LAMINECTOMY FOR SPINAL CORD STIMULATOR N/A 07/27/2019   Procedure: THORACIC SPINAL CORD STIMULATOR PLACEMENT WITH RIGHT FLANK PULSE GENERATOR;  Surgeon: [name], MD;  Location: [facility] ORS;  Service: Neurosurgery;  Laterality: N/A;  . TONSILLECTOMY      Family History  Problem Relation Age of Onset  . Diabetes Mellitus II Mother   . CAD Mother   . Sleep apnea Mother   . Osteoarthritis Mother   . Osteoporosis Mother   . Anxiety disorder Mother   . Depression Mother   . Bipolar disorder Mother   . Bipolar disorder Father   . Hypertension Father   . Depression Father   . Anxiety disorder Father   . Post-traumatic stress disorder Sister     Allergies  Allergen Reactions  . Diazepam Hives and Nausea And Vomiting  . Ziprasidone Hcl Other (See Comments) and Itching    CONFUSED CONFUSED Other reaction(s): Other (See Comments) CONFUSED CONFUSED   . Azithromycin Hives  . Divalproex Sodium Hives   . Levofloxacin Hives and Rash    1/31: would like to retry since not taking valium  . Lisinopril     Medicine cause kidney failure  . Metronidazole Hives  1/31: would like to retry since she is no longer taking valium  . Sulfa Antibiotics Hives  . Valproic Acid Itching and Rash  . Cephalexin Hives  . Ciprofloxacin Hives  . Penicillins Hives    Has patient had a PCN reaction causing immediate rash, facial/tongue/throat swelling, SOB or lightheadedness with hypotension: No Has patient had a PCN reaction causing severe rash involving mucus membranes or skin necrosis: No Has patient had a PCN reaction that required hospitalization: No Has patient had a PCN reaction occurring within the last 10 years: No If all of the above answers are "NO", then may proceed with Cephalosporin use.     CBC Latest Ref Rng & Units 04/27/2020 04/02/2020 03/30/2020  WBC 4.0 - 10.5 K/uL 9.2 8.6 5.9  Hemoglobin 12.0 - 15.0 g/dL 12.6 10.7(L) 10.5(L)  Hematocrit 36.0 - 46.0 % 38.8 33.6(L) 33.1(L)  Platelets 150 - 400 K/uL 277 237 206      CMP     Component Value Date/Time   NA 135 04/27/2020 2009   NA 142 09/09/2017 1032   NA 141 02/22/2014 0049   K 3.6 04/27/2020 2009   K 2.9 (L) 02/22/2014 0049   CL 99 04/27/2020 2009   CL 111 (H) 02/22/2014 0049   CO2 24 04/27/2020 2009   CO2 19 (L) 02/22/2014 0049   GLUCOSE 204 (H) 04/27/2020 2009   GLUCOSE 101 (H) 02/22/2014 0049   BUN 24 (H) 04/27/2020 2009   BUN 13 09/09/2017 1032   BUN 25 (H) 02/22/2014 0049   CREATININE 1.22 (H) 04/27/2020 2009   CREATININE 1.13 02/22/2014 0049   CALCIUM 9.6 04/27/2020 2009   CALCIUM 8.7 02/22/2014 0049   PROT 8.6 (H) 04/27/2020 2009   PROT 7.3 09/09/2017 1032   ALBUMIN 4.7 04/27/2020 2009   ALBUMIN 4.6 09/09/2017 1032   AST 23 04/27/2020 2009   ALT 18 04/27/2020 2009   ALKPHOS 132 (H) 04/27/2020 2009   BILITOT 0.7 04/27/2020 2009   BILITOT 0.2 09/09/2017 1032   GFRNONAA 55 (L) 04/27/2020 2009   GFRNONAA 56 (L)  02/22/2014 0049   GFRAA >60 10/25/2019 1604   GFRAA >60 02/22/2014 0049     No results found.     Assessment & Plan:   1. Lymphedema Recommend:  No surgery or intervention at this point in time.    I have reviewed my previous discussion with the patient regarding swelling and why it causes symptoms.  Patient will continue wearing graduated compression stockings class 1 (20-30 mmHg) on a daily basis. The patient will  beginning wearing the stockings first thing in the morning and removing them in the evening. The patient is instructed specifically not to sleep in the stockings.    In addition, behavioral modification including several periods of elevation of the lower extremities during the day will be continued.  This was reviewed with the patient during the initial visit.  The patient will also continue routine exercise, especially walking on a daily basis as was discussed during the initial visit.    Despite conservative treatments of at least 4 weeks including graduated compression therapy class 1 and behavioral modification including exercise and elevation the patient  has not obtained adequate control of the lymphedema.  The patient still has stage 3 lymphedema and therefore, I believe that a lymph pump should be added to improve the control of the patient's lymphedema.  Additionally, a lymph pump is warranted because it will reduce the risk of cellulitis and ulceration in the future.  Patient should follow-up in six months    2. Essential (primary) hypertension Continue antihypertensive medications as already ordered, these medications have been reviewed and there are no changes at this time.    Current Outpatient Medications on File Prior to Visit  Medication Sig Dispense Refill  . Albuterol Sulfate (PROAIR RESPICLICK) 270 (90 Base) MCG/ACT AEPB Inhale 2 puffs into the lungs every 6 (six) hours as needed.    Marland Kitchen atorvastatin (LIPITOR) 40 MG tablet Take 40 mg by mouth daily.     . baclofen (LIORESAL) 20 MG tablet Take 20 mg by mouth 3 (three) times daily.    . benztropine (COGENTIN) 0.5 MG tablet Take 1 tablet (0.5 mg total) by mouth 2 (two) times daily as needed for tremors. 180 tablet 0  . busPIRone (BUSPAR) 15 MG tablet Take 1 tablet (15 mg total) by mouth 2 (two) times daily. 180 tablet 0  . cyclobenzaprine (FLEXERIL) 10 MG tablet Take 10 mg by mouth 3 (three) times daily as needed.    Marland Kitchen DALIRESP 500 MCG TABS tablet Take 500 mcg by mouth daily.    Marland Kitchen desvenlafaxine (PRISTIQ) 100 MG 24 hr tablet TAKE 1 TABLET BY MOUTH ONCE A DAY 90 tablet 0  . dicyclomine (BENTYL) 10 MG capsule Take 10 mg by mouth 4 (four) times daily -  before meals and at bedtime.    Marland Kitchen doxycycline (VIBRAMYCIN) 100 MG capsule Take 1 capsule (100 mg total) by mouth 2 (two) times daily. 14 capsule 0  . Dulaglutide (TRULICITY) 1.5 WC/3.7SE SOPN once a week.    . fluticasone (FLONASE) 50 MCG/ACT nasal spray Place 2 sprays into both nostrils daily.    Marland Kitchen glipiZIDE (GLUCOTROL XL) 10 MG 24 hr tablet Take 10 mg by mouth daily with breakfast.    . lactulose (CHRONULAC) 10 GM/15ML solution Take 30 mLs (20 g total) by mouth daily as needed for mild constipation. 120 mL 0  . levocetirizine (XYZAL) 5 MG tablet Take 5 mg by mouth every evening.    Marland Kitchen levothyroxine (SYNTHROID) 175 MCG tablet Take 175 mcg by mouth daily before breakfast.    . LORazepam (ATIVAN) 0.5 MG tablet Take 1 tablet (0.5 mg total) by mouth as directed. Take 1 tablet 3-4 times a week only for severe panic attacks 15 tablet 0  . meloxicam (MOBIC) 7.5 MG tablet Take 7.5 mg by mouth 2 (two) times daily.    . metoprolol succinate (TOPROL-XL) 50 MG 24 hr tablet Take 1 tablet by mouth 2 (two) times daily.    . metoprolol tartrate (LOPRESSOR) 100 MG tablet Take 1 tablet (100mg ) by mouth 2 hours prior to cardiac CT. 1 tablet 1  . nitroGLYCERIN (NITROSTAT) 0.4 MG SL tablet Place under the tongue.    Marland Kitchen Olopatadine HCl 0.2 % SOLN olopatadine 0.2 % eye  drops  instill 1 drop into both eyes once daily if needed    . ondansetron (ZOFRAN) 4 MG tablet Take 1 tablet (4 mg total) by mouth every 8 (eight) hours as needed for nausea or vomiting. 20 tablet 0  . ondansetron (ZOFRAN-ODT) 8 MG disintegrating tablet Take 8 mg by mouth every 8 (eight) hours as needed.    Marland Kitchen oxyCODONE (OXY IR/ROXICODONE) 5 MG immediate release tablet Take by mouth.    . oxyCODONE-acetaminophen (PERCOCET/ROXICET) 5-325 MG tablet Take 1 tablet by mouth every 6 (six) hours as needed for severe pain. 8 tablet 0  . potassium chloride (KLOR-CON) 10 MEQ tablet potassium chloride ER 10 mEq tablet,extended release    .  potassium chloride SA (KLOR-CON) 20 MEQ tablet Take 20 mEq by mouth 2 (two) times daily.    . pregabalin (LYRICA) 150 MG capsule Take 1 capsule (150 mg total) by mouth 3 (three) times daily. Do not take gabapentin (Neurontin) while taking this medication. 270 capsule 0  . promethazine (PHENERGAN) 25 MG tablet Take 25 mg by mouth every 6 (six) hours as needed.    Marland Kitchen QUEtiapine (SEROQUEL) 100 MG tablet Take 100 mg by mouth at bedtime.    Marland Kitchen QUEtiapine (SEROQUEL) 25 MG tablet Take 25 mg by mouth 2 (two) times daily. Takes one 25mg  with 100 mg at bedtime and 25 mg in the morning    . rOPINIRole (REQUIP) 1 MG tablet Take 1 mg by mouth 3 (three) times daily.    Marland Kitchen esomeprazole (NEXIUM) 40 MG capsule Take by mouth.     No current facility-administered medications on file prior to visit.    There are no Patient Instructions on file for this visit. No follow-ups on file.   Kris Hartmann, NP

## 2020-05-19 ENCOUNTER — Ambulatory Visit: Payer: Medicare Other

## 2020-05-30 ENCOUNTER — Other Ambulatory Visit (HOSPITAL_COMMUNITY): Payer: Self-pay | Admitting: *Deleted

## 2020-05-30 DIAGNOSIS — I10 Essential (primary) hypertension: Secondary | ICD-10-CM

## 2020-05-31 ENCOUNTER — Telehealth (HOSPITAL_COMMUNITY): Payer: Self-pay | Admitting: *Deleted

## 2020-05-31 NOTE — Telephone Encounter (Signed)
Reaching out to patient to offer assistance regarding upcoming cardiac imaging study; pt states she just got out emergency surgery and will need to re-schedule.  Pt states she knows the number to call to re-schedule.  Gordy Clement RN Navigator Cardiac Imaging [facility] 2 Heart and Vascular [phone] office [phone] cell

## 2020-06-02 ENCOUNTER — Ambulatory Visit: Admission: RE | Admit: 2020-06-02 | Payer: Medicare Other | Source: Ambulatory Visit

## 2020-07-12 ENCOUNTER — Ambulatory Visit
Admission: RE | Admit: 2020-07-12 | Discharge: 2020-07-12 | Disposition: A | Discharge status: Home / Self Care | Payer: BLUE CROSS/BLUE SHIELD | Source: Ambulatory Visit | Attending: Emergency Medicine | Admitting: Emergency Medicine

## 2020-07-12 ENCOUNTER — Other Ambulatory Visit: Payer: Self-pay

## 2020-07-12 VITALS — BP 119/88 | HR 90 | Temp 97.7°F | Resp 19

## 2020-07-12 DIAGNOSIS — R059 Cough, unspecified: Secondary | ICD-10-CM | POA: Diagnosis not present

## 2020-07-12 DIAGNOSIS — B349 Viral infection, unspecified: Secondary | ICD-10-CM | POA: Diagnosis not present

## 2020-07-12 NOTE — Discharge Instructions (Addendum)
Your COVID test is pending.  You should self quarantine until the test result is back.    Take Tylenol as needed for fever or discomfort.  Rest and keep yourself hydrated.    Follow-up with your primary care provider if your symptoms are not improving.

## 2020-07-12 NOTE — ED Triage Notes (Signed)
Pt presents with complaints of congestion, runny nose, cough, drainage down her throat, and congestion in her ears x 3 days. Pt took 2 at home covid tests that were negative.

## 2020-07-12 NOTE — ED Provider Notes (Signed)
Marie Clarke    CSN: [id number] Arrival date & time: 07/12/20  2979      History   Chief Complaint Chief Complaint  Patient presents with  . Facial Pain    HPI Marie Clarke is a 50 y.o. female.   Patient presents with 3-day history of runny nose, postnasal drip, congestion, cough, sore throat, hoarse voice.  She denies fever, rash, shortness of breath, vomiting, diarrhea, or other symptoms.  No treatments attempted at home.  2 negative at-home COVID tests per patient.  Her complicated medical history includes hypertension, CAD, congestive heart failure, MI, stroke, asthma, diabetes, pancreatitis, fibromyalgia, chronic pain, bipolar, drug overdose, suicide attempt.  Followed by pain clinic.   The history is provided by the patient and medical records.    Past Medical History:  Diagnosis Date  . Anemia   . Anginal pain ([place])   . Anxiety   . Arthritis   . Asthma   . Bilateral lower extremity edema   . Bipolar disorder ([place])   . CAD (coronary artery disease) unk  . CHF (congestive heart failure) ([place])   . Chronic pain syndrome   . COPD (chronic obstructive pulmonary disease) (HCC)    emphysema  . DDD (degenerative disc disease), lumbar   . Depression unk  . Diabetes mellitus without complication ([place])   . Diabetes mellitus, type II ([place])   . Drug overdose 29-May-2013   after dad died  . Dysrhythmia    history of tachy arrythmias  . Fibromyalgia   . GERD (gastroesophageal reflux disease)   . Headache    chronic migraines  . Hyperlipidemia   . Hypertension   . Hypothyroidism   . Left leg pain 04/29/2014  . MI (myocardial infarction) ([place]) 05/29/05  . Muscle ache 09/16/2014  . Osteoporosis   . Overactive bladder   . Pancreatitis unk  . Panic attacks   . PTSD (post-traumatic stress disorder)   . Reflex sympathetic dystrophy   . Renal cyst, left   . Renal insufficiency    ckd stage iii  . Restless legs syndrome   . Sleep apnea 06/2019   uses cpap  . Sleep apnea   .  Stroke ([place]) 05-29-08   TIA x 2. no residual deficits  . Suicide attempt [facility])    after father died.  . Thyroid disease    thyroid nodule  . TIA (transient ischemic attack) unk  . TIA (transient ischemic attack)     Patient Active Problem List   Diagnosis Date Noted  . Pain in thoracic spine 05/02/2020  . Rectal pain 04/07/2020  . Partial small bowel obstruction ([place]) 03/29/2020  . Adrenal nodule ([place]) 03/19/2020  . Hypokalemia   . SBO (small bowel obstruction) ([place]) 12/02/2019  . Chronic pain 12/02/2019  . Presence of neurostimulator (SCS) (June 2021) 10/06/2019  . Pain due to any device, implant or graft 09/21/2019  . Anxiety 09/10/2019  . Involuntary movements 09/10/2019  . Abnormal involuntary movement 08/18/2019  . Bipolar disorder, in full remission, most recent episode mixed ([place]) 08/11/2019  . Lumbosacral radiculopathy at L5 (Right) 04/09/2019  . Chronic migraine 03/12/2019  . Gout 01/21/2019  . Noncompliance with treatment regimen 12/29/2018  . BMI 40.0-44.9, adult ([place]) 11/19/2018  . AKI (acute kidney injury) ([place]) 11/17/2018  . Postural dizziness with presyncope 09/29/2018  . Bipolar I disorder, most recent episode mixed ([place]) 09/10/2018  . Panic attacks 09/10/2018  . Chronic lumbosacral L5-S1 IVD protrusion (Bilateral) 08/25/2018  . Lumbar lateral recess  stenosis (L5-S1) (Bilateral) 08/25/2018  . Wound disruption 08/05/2018  . Osteoarthritis involving multiple joints 07/10/2018  . Long term current use of non-steroidal anti-inflammatories (NSAID) 07/10/2018  . NSAID induced gastritis 07/10/2018  . DDD (degenerative disc disease), lumbosacral 04/22/2018  . Disease related peripheral neuropathy 11/13/2017  . Gastroesophageal reflux disease without esophagitis 11/13/2017  . Occipital headache (Bilateral) 11/13/2017  . Cervicogenic headache (Bilateral) (L>R) 11/13/2017  . History of postoperative nausea 10/22/2017  . Chronic shoulder pain (5th area of Pain)  (Bilateral) (L>R) 10/09/2017  . Ankle joint instability (Left) 10/09/2017  . Ankle sprain, sequela (Left) 10/09/2017  . History of psychiatric symptoms 10/09/2017  . Chronic pain syndrome 10/02/2017  . Spondylosis without myelopathy or radiculopathy, lumbosacral region 10/02/2017  . Chronic musculoskeletal pain 10/02/2017  . Elevated C-reactive protein (CRP) 09/10/2017  . Elevated sed rate 09/10/2017  . Chronic neck pain (4th area of Pain) (Bilateral) (L>R) 09/09/2017  . Pharmacologic therapy 09/09/2017  . Disorder of skeletal system 09/09/2017  . Problems influencing health status 09/09/2017  . Long term current use of opiate analgesic 09/09/2017  . Tobacco use disorder 07/16/2017  . Ventral hernia without obstruction or gangrene 06/25/2017  . Strain of extensor muscle, fascia and tendon of left index finger at wrist and hand level, initial encounter 05/16/2017  . Sepsis ([place]) 04/14/2017  . Osteopenia 04/03/2017  . Hypotension 09/17/2016  . Contusion of knee (Left) 10/12/2015  . Strain of knee (Left) 10/12/2015  . Incidental lung nodule 04/28/2015  . Chronic low back pain (1ry area of Pain) (Bilateral) (L>R) 03/28/2015  . Chronic lower extremity pain (Referred) (2ry area of Pain) (Left) 03/28/2015  . Abdominal wound dehiscence 03/28/2015  . Encounter for pain management planning 03/28/2015  . Morbid obesity ([place]) 03/28/2015  . Abnormal CT scan, lumbar spine 03/28/2015  . Lumbar facet hypertrophy 03/28/2015  . Lumbar facet syndrome (Bilateral) (L>R) 03/28/2015  . Lumbar foraminal stenosis (Bilateral) (L5-S1) 03/28/2015  . Chronic ankle pain (3ry area of Pain) (Left) 03/28/2015  . Neurogenic pain 03/28/2015  . Neuropathic pain 03/28/2015  . Myofascial pain 03/28/2015  . History of suicide attempt 03/28/2015  . PTSD (post-traumatic stress disorder) 01/13/2015  . Abnormal gait 12/15/2014  . Congestive heart failure ([place]) 11/15/2014  . Abdominal wall abscess 09/20/2014  .  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 Diabetes mellitus, type 2 ([place]) 08/13/2014  . Bipolar affective disorder ([place]) 08/13/2014  . Type 2 diabetes mellitus ([place]) 08/13/2014  . Rectal prolapse 08/09/2014  . Rectal bleeding 08/09/2014  . Rectal bleed 08/09/2014  . Affective bipolar disorder ([place]) 08/05/2014  . Arteriosclerosis of coronary artery 08/05/2014  . CCF (congestive cardiac failure) ([place]) 08/05/2014  . Chronic kidney disease 08/05/2014  . 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 Apnea, sleep 08/05/2014  . Temporary cerebral vascular dysfunction 08/05/2014  . Polypharmacy 04/29/2014  . Other long term (current) drug therapy 04/29/2014  . 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 Chronic kidney disease, stage III (moderate) ([place]) 12/14/2013  . Controlled diabetes mellitus type II without complication ([place]) 57/84/6962  . Essential (primary) hypertension 12/03/2013  . Adult hypothyroidism 12/03/2013  . Controlled type 2 diabetes mellitus without complication ([place]) 95/28/4132    Past Surgical History:  Procedure Laterality Date  . ABDOMINAL HYSTERECTOMY    . CARDIAC CATHETERIZATION    . HERNIA REPAIR  07/15/2017   [facility]  . lumbar facet     several  . prolapse rectum surgery N/A July 2016  . THORACIC LAMINECTOMY FOR SPINAL CORD STIMULATOR N/A 07/27/2019  Procedure: THORACIC SPINAL CORD STIMULATOR PLACEMENT WITH RIGHT FLANK PULSE GENERATOR;  Surgeon: [name], MD;  Location: [facility] ORS;  Service: Neurosurgery;  Laterality: N/A;  . TONSILLECTOMY      OB History    Gravida  0   Para  0   Term  0   Preterm  0   AB  0   Living  0     SAB  0   IAB  0   Ectopic  0   Multiple  0   Live Births  0        Obstetric Comments  Menstrual age: 58  Age 1st Pregnancy:          Home Medications    Prior to Admission medications   Medication Sig Start Date End Date Taking? Authorizing Provider  Albuterol Sulfate (PROAIR RESPICLICK) 517 (90 Base) MCG/ACT AEPB  Inhale 2 puffs into the lungs every 6 (six) hours as needed. 10/26/19   [provider]  atorvastatin (LIPITOR) 40 MG tablet Take 40 mg by mouth daily. 04/05/20   [provider]  baclofen (LIORESAL) 20 MG tablet Take 20 mg by mouth 3 (three) times daily. 03/23/20   [provider]  benztropine (COGENTIN) 0.5 MG tablet Take 1 tablet (0.5 mg total) by mouth 2 (two) times daily as needed for tremors. 01/18/20   Ursula Alert, MD  busPIRone (BUSPAR) 15 MG tablet Take 1 tablet (15 mg total) by mouth 2 (two) times daily. 12/28/19   Ursula Alert, MD  cyclobenzaprine (FLEXERIL) 10 MG tablet Take 10 mg by mouth 3 (three) times daily as needed. 12/17/19   [provider]  DALIRESP 500 MCG TABS tablet Take 500 mcg by mouth daily. 04/22/20   [provider]  desvenlafaxine (PRISTIQ) 100 MG 24 hr tablet TAKE 1 TABLET BY MOUTH ONCE A DAY 04/08/20   Ursula Alert, MD  dicyclomine (BENTYL) 10 MG capsule Take 10 mg by mouth 4 (four) times daily -  before meals and at bedtime. 10/16/19   [provider]  doxycycline (VIBRAMYCIN) 100 MG capsule Take 1 capsule (100 mg total) by mouth 2 (two) times daily. 05/07/20   Delman Kitten, MD  Dulaglutide (TRULICITY) 1.5 OH/6.0VP SOPN once a week.    [provider]  esomeprazole (NEXIUM) 40 MG capsule Take by mouth. 12/23/19 03/29/20  [provider]  fluticasone (FLONASE) 50 MCG/ACT nasal spray Place 2 sprays into both nostrils daily. 06/03/19   [provider]  glipiZIDE (GLUCOTROL XL) 10 MG 24 hr tablet Take 10 mg by mouth daily with breakfast. 11/17/19 11/16/20  [provider]  lactulose (CHRONULAC) 10 GM/15ML solution Take 30 mLs (20 g total) by mouth daily as needed for mild constipation. 04/28/20   Paulette Blanch, MD  levocetirizine (XYZAL) 5 MG tablet Take 5 mg by mouth every evening. 01/08/20   [provider]  levothyroxine (SYNTHROID) 175 MCG tablet Take 175 mcg by mouth daily  before breakfast. 11/10/19 11/09/20  [provider]  LORazepam (ATIVAN) 0.5 MG tablet Take 1 tablet (0.5 mg total) by mouth as directed. Take 1 tablet 3-4 times a week only for severe panic attacks 01/01/20   Ursula Alert, MD  meloxicam (MOBIC) 7.5 MG tablet Take 7.5 mg by mouth 2 (two) times daily. 02/20/20   [provider]  metoprolol succinate (TOPROL-XL) 50 MG 24 hr tablet Take 1 tablet by mouth 2 (two) times daily. 04/29/20 04/29/21  [provider]  metoprolol tartrate (LOPRESSOR) 100  MG tablet Take 1 tablet (100mg ) by mouth 2 hours prior to cardiac CT. 05/03/20   [place], Dwayne D, MD  nitroGLYCERIN (NITROSTAT) 0.4 MG SL tablet Place under the tongue. 04/21/20   [provider]  Olopatadine HCl 0.2 % SOLN olopatadine 0.2 % eye drops  instill 1 drop into both eyes once daily if needed    [provider]  ondansetron (ZOFRAN) 4 MG tablet Take 1 tablet (4 mg total) by mouth every 8 (eight) hours as needed for nausea or vomiting. 04/28/20   Paulette Blanch, MD  ondansetron (ZOFRAN-ODT) 8 MG disintegrating tablet Take 8 mg by mouth every 8 (eight) hours as needed. 12/29/19   [provider]  oxyCODONE (OXY IR/ROXICODONE) 5 MG immediate release tablet Take by mouth. 04/28/20   [provider]  oxyCODONE-acetaminophen (PERCOCET/ROXICET) 5-325 MG tablet Take 1 tablet by mouth every 6 (six) hours as needed for severe pain. 05/07/20   Delman Kitten, MD  potassium chloride (KLOR-CON) 10 MEQ tablet potassium chloride ER 10 mEq tablet,extended release    [provider]  potassium chloride SA (KLOR-CON) 20 MEQ tablet Take 20 mEq by mouth 2 (two) times daily. 12/25/19   [provider]  pregabalin (LYRICA) 150 MG capsule Take 1 capsule (150 mg total) by mouth 3 (three) times daily. Do not take gabapentin (Neurontin) while taking this medication. 03/04/20 06/02/20  Milinda Pointer, MD  promethazine (PHENERGAN) 25 MG tablet Take 25 mg by  mouth every 6 (six) hours as needed. 03/26/20   [provider]  QUEtiapine (SEROQUEL) 100 MG tablet Take 100 mg by mouth at bedtime. 11/25/19   [provider]  QUEtiapine (SEROQUEL) 25 MG tablet Take 25 mg by mouth 2 (two) times daily. Takes one 25mg  with 100 mg at bedtime and 25 mg in the morning    [provider]  rOPINIRole (REQUIP) 1 MG tablet Take 1 mg by mouth 3 (three) times daily. 12/10/19   [provider]    Family History Family History  Problem Relation Age of Onset  . Diabetes Mellitus II Mother   . CAD Mother   . Sleep apnea Mother   . Osteoarthritis Mother   . Osteoporosis Mother   . Anxiety disorder Mother   . Depression Mother   . Bipolar disorder Mother   . Bipolar disorder Father   . Hypertension Father   . Depression Father   . Anxiety disorder Father   . Post-traumatic stress disorder Sister     Social History Social History   Tobacco Use  . Smoking status: Former Smoker    Packs/day: 0.50    Types: Cigarettes    Quit date: 09/01/2018    Years since quitting: 1.8  . Smokeless tobacco: Never Used  . Tobacco comment: Patient quit smoking 09/01/2018  Vaping Use  . Vaping Use: Never used  Substance Use Topics  . Alcohol use: No    Alcohol/week: 0.0 standard drinks  . Drug use: No     Allergies   Diazepam, Ziprasidone hcl, Azithromycin, Divalproex sodium, Levofloxacin, Lisinopril, Metronidazole, Sulfa antibiotics, Valproic acid, Cephalexin, Ciprofloxacin, and Penicillins   Review of Systems Review of Systems  Constitutional: Negative for chills and fever.  HENT: Positive for congestion, postnasal drip, rhinorrhea, sore throat and voice change. Negative for ear pain.   Respiratory: Positive for cough. Negative for shortness of breath.   Cardiovascular: Negative for chest pain and palpitations.  Gastrointestinal: Negative for abdominal pain, diarrhea and vomiting.  Skin: Negative for  color change and rash.  All  other systems reviewed and are negative.    Physical Exam Triage Vital Signs ED Triage Vitals  Enc Vitals Group     BP      Pulse      Resp      Temp      Temp src      SpO2      Weight      Height      Head Circumference      Peak Flow      Pain Score      Pain Loc      Pain Edu?      Excl. in [place]?    No data found.  Updated Vital Signs BP 119/88   Pulse 90   Temp 97.7 F (36.5 C)   Resp 19   LMP  (LMP Unknown)   SpO2 97%   Visual Acuity Right Eye Distance:   Left Eye Distance:   Bilateral Distance:    Right Eye Near:   Left Eye Near:    Bilateral Near:     Physical Exam Vitals and nursing note reviewed.  Constitutional:      General: She is not in acute distress.    Appearance: She is well-developed. She is obese. She is not ill-appearing.  HENT:     Head: Normocephalic and atraumatic.     Right Ear: Tympanic membrane normal.     Left Ear: Tympanic membrane normal.     Nose: Nose normal.     Mouth/Throat:     Mouth: Mucous membranes are moist.     Pharynx: Oropharynx is clear.  Eyes:     Conjunctiva/sclera: Conjunctivae normal.  Cardiovascular:     Rate and Rhythm: Normal rate and regular rhythm.     Heart sounds: Normal heart sounds.  Pulmonary:     Effort: Pulmonary effort is normal. No respiratory distress.     Breath sounds: Normal breath sounds.  Abdominal:     Palpations: Abdomen is soft.     Tenderness: There is no abdominal tenderness.  Musculoskeletal:     Cervical back: Neck supple.  Skin:    General: Skin is warm and dry.     Findings: No rash.  Neurological:     General: No focal deficit present.     Mental Status: She is alert and oriented to person, place, and time.     Gait: Gait normal.  Psychiatric:        Mood and Affect: Mood normal.        Behavior: Behavior normal.      UC Treatments / Results  Labs (all labs ordered are listed, but only abnormal results are displayed) Labs Reviewed  NOVEL CORONAVIRUS, NAA     EKG   Radiology No results found.  Procedures Procedures (including critical care time)  Medications Ordered in UC Medications - No data to display  Initial Impression / Assessment and Plan / UC Course  I have reviewed the triage vital signs and the nursing notes.  Pertinent labs & imaging results that were available during my care of the patient were reviewed by me and considered in my medical decision making (see chart for details).   Viral illness, cough. COVID pending.  Instructed patient to self quarantine until the test result is back.  Discussed symptomatic treatment including Tylenol, rest, hydration.  Instructed patient to follow up with PCP if her symptoms are not improving.  Patient agrees to plan  of care.    Final Clinical Impressions(s) / UC Diagnoses   Final diagnoses:  Cough  Viral illness     Discharge Instructions     Your COVID test is pending.  You should self quarantine until the test result is back.    Take Tylenol as needed for fever or discomfort.  Rest and keep yourself hydrated.    Follow-up with your primary care provider if your symptoms are not improving.        ED Prescriptions    None     PDMP not reviewed this encounter.   Sharion Balloon, NP 07/12/20 1021

## 2020-07-13 LAB — NOVEL CORONAVIRUS, NAA: SARS-CoV-2, NAA: NOT DETECTED

## 2020-07-13 LAB — SARS-COV-2, NAA 2 DAY TAT

## 2020-07-27 ENCOUNTER — Emergency Department
Admission: EM | Admit: 2020-07-27 | Discharge: 2020-07-27 | Discharge status: Against Medical Advice | Payer: Medicare Other

## 2020-09-14 ENCOUNTER — Other Ambulatory Visit: Payer: Self-pay | Admitting: Neurosurgery

## 2020-09-15 ENCOUNTER — Ambulatory Visit: Payer: Self-pay | Admitting: *Deleted

## 2020-09-15 NOTE — Telephone Encounter (Signed)
Answer Assessment - Initial Assessment Questions 1. REASON FOR CALL or QUESTION: "What is your reason for calling today?" or "How can I best help you?" or "What question do you have that I can help answer?"     I wanted to confirm my Covid booster shot appt.    Do y'all have the monkey pox vaccine available?  Protocols used: Information Only Call - No Triage-A-AH Pt called in wanting to confirm her Covid booster appt.   I confirmed it with her.   She wanted to know about the monkey pox vaccine I let her know it wasn't available yet.     She thanked me for my help.

## 2020-09-16 ENCOUNTER — Other Ambulatory Visit: Payer: Self-pay

## 2020-09-16 ENCOUNTER — Ambulatory Visit: Payer: Medicare Other | Attending: Internal Medicine

## 2020-09-16 DIAGNOSIS — Z23 Encounter for immunization: Secondary | ICD-10-CM

## 2020-09-16 MED ORDER — PFIZER-BIONT COVID-19 VAC-TRIS 30 MCG/0.3ML IM SUSP
INTRAMUSCULAR | 0 refills | Status: DC
  Filled 2020-09-16: qty 0.3, 1d supply, fill #0

## 2020-09-16 NOTE — Progress Notes (Signed)
   Covid-19 Vaccination Clinic  Name:  Marie Clarke    MRN: [id number] DOB: [birth year]  09/16/2020  [name] was observed post Covid-19 immunization for 15 minutes without incident. She was provided with Vaccine Information Sheet and instruction to access the V-Safe system.   [name] was instructed to call 911 with any severe reactions post vaccine: Difficulty breathing  Swelling of face and throat  A fast heartbeat  A bad rash all over body  Dizziness and weakness   Immunizations Administered     Name Date Dose VIS Date Route   PFIZER Comrnaty(Gray TOP) Covid-19 Vaccine 09/16/2020  9:18 AM 0.3 mL 01/21/2020 Intramuscular   Manufacturer: [place]   Lot: [id number]   [place]: [place], PharmD, MBA Clinical Acute Care Pharmacist

## 2020-09-23 ENCOUNTER — Encounter (HOSPITAL_COMMUNITY): Payer: Self-pay | Admitting: Urgent Care

## 2020-09-23 ENCOUNTER — Other Ambulatory Visit: Payer: Self-pay

## 2020-09-23 ENCOUNTER — Encounter
Admission: RE | Admit: 2020-09-23 | Discharge: 2020-09-23 | Disposition: A | Discharge status: Home / Self Care | Payer: Medicare Other | Source: Ambulatory Visit | Attending: Neurosurgery | Admitting: Neurosurgery

## 2020-09-23 DIAGNOSIS — Z01812 Encounter for preprocedural laboratory examination: Secondary | ICD-10-CM | POA: Diagnosis present

## 2020-09-23 LAB — TYPE AND SCREEN
ABO/RH(D): A NEG
Antibody Screen: NEGATIVE

## 2020-09-23 LAB — CBC
HCT: 35.5 % — ABNORMAL LOW (ref 36.0–46.0)
Hemoglobin: 11.9 g/dL — ABNORMAL LOW (ref 12.0–15.0)
MCH: 29.7 pg (ref 26.0–34.0)
MCHC: 33.5 g/dL (ref 30.0–36.0)
MCV: 88.5 fL (ref 80.0–100.0)
Platelets: 250 10*3/uL (ref 150–400)
RBC: 4.01 MIL/uL (ref 3.87–5.11)
RDW: 14 % (ref 11.5–15.5)
WBC: 6.9 10*3/uL (ref 4.0–10.5)
nRBC: 0 % (ref 0.0–0.2)

## 2020-09-23 LAB — SURGICAL PCR SCREEN
MRSA, PCR: NEGATIVE
Staphylococcus aureus: POSITIVE — AB

## 2020-09-23 LAB — BASIC METABOLIC PANEL
Anion gap: 12 (ref 5–15)
BUN: 26 mg/dL — ABNORMAL HIGH (ref 6–20)
CO2: 26 mmol/L (ref 22–32)
Calcium: 9.3 mg/dL (ref 8.9–10.3)
Chloride: 100 mmol/L (ref 98–111)
Creatinine, Ser: 1.07 mg/dL — ABNORMAL HIGH (ref 0.44–1.00)
GFR, Estimated: >60 mL/min (ref >60)
Glucose, Bld: 129 mg/dL — ABNORMAL HIGH (ref 70–99)
Potassium: 3.5 mmol/L (ref 3.5–5.1)
Sodium: 138 mmol/L (ref 135–145)

## 2020-09-23 LAB — APTT: aPTT: 25 seconds (ref 24–36)

## 2020-09-23 LAB — PROTIME-INR
INR: 1 (ref 0.8–1.2)
Prothrombin Time: 12.9 seconds (ref 11.4–15.2)

## 2020-09-23 NOTE — Patient Instructions (Addendum)
Your procedure is scheduled on: October 03, 2020 Monday  Report to the Registration Desk on the 1st floor of the [facility]. To find out your arrival time, please call [phone] between 1PM - 3PM on: Friday September 30, 2020  REMEMBER: Instructions that are not followed completely may result in serious medical risk, up to and including death; or upon the discretion of your surgeon and anesthesiologist your surgery may need to be rescheduled.  Do not eat food after midnight the night before surgery.  No gum chewing, lozengers or hard candies.  You may however, drink CLEAR liquids up to 2 hours before you are scheduled to arrive for your surgery. Do not drink anything within 2 hours of your scheduled arrival time.  Clear liquids include: - water  - black coffee  (Do NOT add milk or creamers to the coffee or tea) Do NOT drink anything that is not on this list.  Type 1 and Type 2 diabetics should only drink water.  TAKE THESE MEDICATIONS THE MORNING OF SURGERY WITH A SIP OF WATER: METOPROLOL ROPINIROLE LEVOTHYROXINE CLONAZEPAM DICLYOMINE OMEPRAZOLE GABAPENTIN  Use inhalers on the day of surgery   LAST OF DOSE OF ASPIRIN IS 09/25/2020 SUNDAY One week prior to surgery: Stop Anti-inflammatories (NSAIDS) such as Advil, Aleve, Ibuprofen, Motrin, Naproxen, Naprosyn and Aspirin based products such as Excedrin, Goodys Powder, BC Powder. Stop ANY OVER THE COUNTER supplements until after surgery. CONTINUE POTASSIUM You may however, continue to take Tylenol if needed for pain up until the day of surgery.  No Alcohol for 24 hours before or after surgery.  No Smoking including e-cigarettes for 24 hours prior to surgery.  No chewable tobacco products for at least 6 hours prior to surgery.  No nicotine patches on the day of surgery.  Do not use any "recreational" drugs for at least a week prior to your surgery.  Please be advised that the combination of cocaine and anesthesia may have  negative outcomes, up to and including death. If you test positive for cocaine, your surgery will be cancelled.  On the morning of surgery brush your teeth with toothpaste and water, you may rinse your mouth with mouthwash if you wish. Do not swallow any toothpaste or mouthwash.  Do not wear jewelry, make-up, hairpins, clips or nail polish.  Do not wear lotions, powders, or perfumes.   Do not shave body from the neck down 48 hours prior to surgery just in case you cut yourself which could leave a site for infection.  Also, freshly shaved skin may become irritated if using the CHG soap.  Contact lenses, hearing aids and dentures may not be worn into surgery.  Do not bring valuables to the hospital. [facility] is not responsible for any missing/lost belongings or valuables.   Use CHG Soap as directed on instruction sheet.  Bring your C-PAP to the hospital with you in case you may have to spend the night.   Notify your doctor if there is any change in your medical condition (cold, fever, infection).  Wear comfortable clothing (specific to your surgery type) to the hospital.  After surgery, you can help prevent lung complications by doing breathing exercises.  Take deep breaths and cough every 1-2 hours. Your doctor may order a device called an Incentive Spirometer to help you take deep breaths. When coughing or sneezing, hold a pillow firmly against your incision with both hands. This is called "splinting." Doing this helps protect your incision. It also decreases belly  discomfort.  If you are being discharged the day of surgery, you will not be allowed to drive home. You will need a responsible adult (18 years or older) to drive you home and stay with you that night.   If you are taking public transportation, you will need to have a responsible adult (18 years or older) with you. Please confirm with your physician that it is acceptable to use public transportation.   Please call the  [facility] Dept. at [phone] if you have any questions about these instructions.  Surgery Visitation Policy:  Patients undergoing a surgery or procedure may have one family member or support person with them as long as that person is not COVID-19 positive or experiencing its symptoms.  That person may remain in the waiting area during the procedure.  Inpatient Visitation:    Visiting hours are 7 a.m. to 8 p.m. Inpatients will be allowed two visitors daily. The visitors may change each day during the patient's stay. No visitors under the age of 70. Any visitor under the age of 69 must be accompanied by an adult. The visitor must pass COVID-19 screenings, use hand sanitizer when entering and exiting the patient's room and wear a mask at all times, including in the patient's room. Patients must also wear a mask when staff or their visitor are in the room. Masking is required regardless of vaccination status.

## 2020-09-30 ENCOUNTER — Emergency Department: Payer: Medicare Other

## 2020-09-30 ENCOUNTER — Other Ambulatory Visit: Payer: Self-pay

## 2020-09-30 ENCOUNTER — Observation Stay: Payer: Medicare Other

## 2020-09-30 ENCOUNTER — Inpatient Hospital Stay
Admission: EM | Admit: 2020-09-30 | Discharge: 2020-10-01 | DRG: 092 | Payer: Medicare Other | Attending: Internal Medicine | Admitting: Internal Medicine

## 2020-09-30 DIAGNOSIS — M8580 Other specified disorders of bone density and structure, unspecified site: Secondary | ICD-10-CM | POA: Diagnosis present

## 2020-09-30 DIAGNOSIS — R130 Aphagia: Secondary | ICD-10-CM | POA: Diagnosis present

## 2020-09-30 DIAGNOSIS — K439 Ventral hernia without obstruction or gangrene: Secondary | ICD-10-CM | POA: Diagnosis present

## 2020-09-30 DIAGNOSIS — I2510 Atherosclerotic heart disease of native coronary artery without angina pectoris: Secondary | ICD-10-CM | POA: Diagnosis present

## 2020-09-30 DIAGNOSIS — E1169 Type 2 diabetes mellitus with other specified complication: Secondary | ICD-10-CM

## 2020-09-30 DIAGNOSIS — E785 Hyperlipidemia, unspecified: Secondary | ICD-10-CM | POA: Diagnosis present

## 2020-09-30 DIAGNOSIS — Z9682 Presence of neurostimulator: Secondary | ICD-10-CM | POA: Diagnosis not present

## 2020-09-30 DIAGNOSIS — J439 Emphysema, unspecified: Secondary | ICD-10-CM | POA: Diagnosis present

## 2020-09-30 DIAGNOSIS — M810 Age-related osteoporosis without current pathological fracture: Secondary | ICD-10-CM | POA: Diagnosis present

## 2020-09-30 DIAGNOSIS — G4730 Sleep apnea, unspecified: Secondary | ICD-10-CM | POA: Diagnosis present

## 2020-09-30 DIAGNOSIS — G894 Chronic pain syndrome: Secondary | ICD-10-CM | POA: Diagnosis present

## 2020-09-30 DIAGNOSIS — Z880 Allergy status to penicillin: Secondary | ICD-10-CM

## 2020-09-30 DIAGNOSIS — Z8249 Family history of ischemic heart disease and other diseases of the circulatory system: Secondary | ICD-10-CM

## 2020-09-30 DIAGNOSIS — G8384 Todd's paralysis (postepileptic): Principal | ICD-10-CM

## 2020-09-30 DIAGNOSIS — R569 Unspecified convulsions: Secondary | ICD-10-CM | POA: Diagnosis not present

## 2020-09-30 DIAGNOSIS — M5136 Other intervertebral disc degeneration, lumbar region: Secondary | ICD-10-CM | POA: Diagnosis present

## 2020-09-30 DIAGNOSIS — Z79890 Hormone replacement therapy (postmenopausal): Secondary | ICD-10-CM

## 2020-09-30 DIAGNOSIS — I5032 Chronic diastolic (congestive) heart failure: Secondary | ICD-10-CM | POA: Diagnosis present

## 2020-09-30 DIAGNOSIS — Z85820 Personal history of malignant melanoma of skin: Secondary | ICD-10-CM

## 2020-09-30 DIAGNOSIS — F410 Panic disorder [episodic paroxysmal anxiety] without agoraphobia: Secondary | ICD-10-CM | POA: Diagnosis present

## 2020-09-30 DIAGNOSIS — I10 Essential (primary) hypertension: Secondary | ICD-10-CM | POA: Diagnosis not present

## 2020-09-30 DIAGNOSIS — Z8262 Family history of osteoporosis: Secondary | ICD-10-CM

## 2020-09-30 DIAGNOSIS — E876 Hypokalemia: Secondary | ICD-10-CM | POA: Diagnosis present

## 2020-09-30 DIAGNOSIS — F4310 Post-traumatic stress disorder, unspecified: Secondary | ICD-10-CM | POA: Diagnosis present

## 2020-09-30 DIAGNOSIS — I130 Hypertensive heart and chronic kidney disease with heart failure and stage 1 through stage 4 chronic kidney disease, or unspecified chronic kidney disease: Secondary | ICD-10-CM | POA: Diagnosis present

## 2020-09-30 DIAGNOSIS — Z87891 Personal history of nicotine dependence: Secondary | ICD-10-CM

## 2020-09-30 DIAGNOSIS — M797 Fibromyalgia: Secondary | ICD-10-CM | POA: Diagnosis present

## 2020-09-30 DIAGNOSIS — E278 Other specified disorders of adrenal gland: Secondary | ICD-10-CM | POA: Diagnosis present

## 2020-09-30 DIAGNOSIS — R1030 Lower abdominal pain, unspecified: Secondary | ICD-10-CM | POA: Diagnosis not present

## 2020-09-30 DIAGNOSIS — K219 Gastro-esophageal reflux disease without esophagitis: Secondary | ICD-10-CM | POA: Diagnosis present

## 2020-09-30 DIAGNOSIS — E1122 Type 2 diabetes mellitus with diabetic chronic kidney disease: Secondary | ICD-10-CM | POA: Diagnosis present

## 2020-09-30 DIAGNOSIS — Z882 Allergy status to sulfonamides status: Secondary | ICD-10-CM

## 2020-09-30 DIAGNOSIS — Z8673 Personal history of transient ischemic attack (TIA), and cerebral infarction without residual deficits: Secondary | ICD-10-CM

## 2020-09-30 DIAGNOSIS — F319 Bipolar disorder, unspecified: Secondary | ICD-10-CM | POA: Diagnosis present

## 2020-09-30 DIAGNOSIS — R29810 Facial weakness: Secondary | ICD-10-CM | POA: Diagnosis present

## 2020-09-30 DIAGNOSIS — Z79899 Other long term (current) drug therapy: Secondary | ICD-10-CM

## 2020-09-30 DIAGNOSIS — Z818 Family history of other mental and behavioral disorders: Secondary | ICD-10-CM

## 2020-09-30 DIAGNOSIS — Z888 Allergy status to other drugs, medicaments and biological substances status: Secondary | ICD-10-CM

## 2020-09-30 DIAGNOSIS — N183 Chronic kidney disease, stage 3 unspecified: Secondary | ICD-10-CM | POA: Diagnosis present

## 2020-09-30 DIAGNOSIS — Z20822 Contact with and (suspected) exposure to covid-19: Secondary | ICD-10-CM | POA: Diagnosis present

## 2020-09-30 DIAGNOSIS — E279 Disorder of adrenal gland, unspecified: Secondary | ICD-10-CM | POA: Diagnosis present

## 2020-09-30 DIAGNOSIS — R911 Solitary pulmonary nodule: Secondary | ICD-10-CM | POA: Diagnosis present

## 2020-09-30 DIAGNOSIS — R109 Unspecified abdominal pain: Secondary | ICD-10-CM

## 2020-09-30 DIAGNOSIS — Z881 Allergy status to other antibiotic agents status: Secondary | ICD-10-CM

## 2020-09-30 DIAGNOSIS — G2581 Restless legs syndrome: Secondary | ICD-10-CM | POA: Diagnosis present

## 2020-09-30 DIAGNOSIS — Z833 Family history of diabetes mellitus: Secondary | ICD-10-CM

## 2020-09-30 DIAGNOSIS — Z7982 Long term (current) use of aspirin: Secondary | ICD-10-CM

## 2020-09-30 DIAGNOSIS — E119 Type 2 diabetes mellitus without complications: Secondary | ICD-10-CM

## 2020-09-30 DIAGNOSIS — M1990 Unspecified osteoarthritis, unspecified site: Secondary | ICD-10-CM | POA: Diagnosis present

## 2020-09-30 DIAGNOSIS — J449 Chronic obstructive pulmonary disease, unspecified: Secondary | ICD-10-CM | POA: Diagnosis present

## 2020-09-30 DIAGNOSIS — E039 Hypothyroidism, unspecified: Secondary | ICD-10-CM | POA: Diagnosis present

## 2020-09-30 DIAGNOSIS — G4733 Obstructive sleep apnea (adult) (pediatric): Secondary | ICD-10-CM | POA: Diagnosis present

## 2020-09-30 LAB — COMPREHENSIVE METABOLIC PANEL
ALT: 17 U/L (ref 0–44)
AST: 17 U/L (ref 15–41)
Albumin: 3.8 g/dL (ref 3.5–5.0)
Alkaline Phosphatase: 127 U/L — ABNORMAL HIGH (ref 38–126)
Anion gap: 9 (ref 5–15)
BUN: 14 mg/dL (ref 6–20)
CO2: 29 mmol/L (ref 22–32)
Calcium: 8.6 mg/dL — ABNORMAL LOW (ref 8.9–10.3)
Chloride: 97 mmol/L — ABNORMAL LOW (ref 98–111)
Creatinine, Ser: 0.89 mg/dL (ref 0.44–1.00)
GFR, Estimated: >60 mL/min (ref >60)
Glucose, Bld: 97 mg/dL (ref 70–99)
Potassium: 3 mmol/L — ABNORMAL LOW (ref 3.5–5.1)
Sodium: 135 mmol/L (ref 135–145)
Total Bilirubin: 0.6 mg/dL (ref 0.3–1.2)
Total Protein: 7.8 g/dL (ref 6.5–8.1)

## 2020-09-30 LAB — DIFFERENTIAL
Abs Immature Granulocytes: 0.03 10*3/uL (ref 0.00–0.07)
Basophils Absolute: 0 10*3/uL (ref 0.0–0.1)
Basophils Relative: 0 %
Eosinophils Absolute: 0 10*3/uL (ref 0.0–0.5)
Eosinophils Relative: 0 %
Immature Granulocytes: 0 %
Lymphocytes Relative: 22 %
Lymphs Abs: 1.9 10*3/uL (ref 0.7–4.0)
Monocytes Absolute: 0.7 10*3/uL (ref 0.1–1.0)
Monocytes Relative: 8 %
Neutro Abs: 6.1 10*3/uL (ref 1.7–7.7)
Neutrophils Relative %: 70 %

## 2020-09-30 LAB — CBC
HCT: 34.7 % — ABNORMAL LOW (ref 36.0–46.0)
Hemoglobin: 11.5 g/dL — ABNORMAL LOW (ref 12.0–15.0)
MCH: 29.3 pg (ref 26.0–34.0)
MCHC: 33.1 g/dL (ref 30.0–36.0)
MCV: 88.5 fL (ref 80.0–100.0)
Platelets: 263 10*3/uL (ref 150–400)
RBC: 3.92 MIL/uL (ref 3.87–5.11)
RDW: 14.1 % (ref 11.5–15.5)
WBC: 8.7 10*3/uL (ref 4.0–10.5)
nRBC: 0 % (ref 0.0–0.2)

## 2020-09-30 LAB — RESP PANEL BY RT-PCR (FLU A&B, COVID) ARPGX2
Influenza A by PCR: NEGATIVE
Influenza B by PCR: NEGATIVE
SARS Coronavirus 2 by RT PCR: NEGATIVE

## 2020-09-30 LAB — APTT: aPTT: 26 seconds (ref 24–36)

## 2020-09-30 LAB — PROTIME-INR
INR: 1.1 (ref 0.8–1.2)
Prothrombin Time: 14 seconds (ref 11.4–15.2)

## 2020-09-30 LAB — GLUCOSE, CAPILLARY: Glucose-Capillary: 122 mg/dL — ABNORMAL HIGH (ref 70–99)

## 2020-09-30 LAB — MAGNESIUM: Magnesium: 1.8 mg/dL (ref 1.7–2.4)

## 2020-09-30 MED ORDER — CLONAZEPAM 0.125 MG PO TBDP
0.1250 mg | ORAL_TABLET | Freq: Three times a day (TID) | ORAL | Status: DC
  Administered 2020-10-01 (×2): 0.125 mg via ORAL
  Filled 2020-09-30 (×2): qty 1

## 2020-09-30 MED ORDER — ALBUTEROL SULFATE (2.5 MG/3ML) 0.083% IN NEBU: 2.5000 mg | INHALATION_SOLUTION | Freq: Four times a day (QID) | RESPIRATORY_TRACT | Status: DC | PRN

## 2020-09-30 MED ORDER — GABAPENTIN 300 MG PO CAPS: 300.0000 mg | ORAL_CAPSULE | ORAL | Status: DC

## 2020-09-30 MED ORDER — POTASSIUM CHLORIDE CRYS ER 20 MEQ PO TBCR
40.0000 meq | EXTENDED_RELEASE_TABLET | Freq: Two times a day (BID) | ORAL | Status: DC
  Administered 2020-10-01: 40 meq via ORAL
  Filled 2020-09-30: qty 2

## 2020-09-30 MED ORDER — SENNOSIDES-DOCUSATE SODIUM 8.6-50 MG PO TABS: 1.0000 | ORAL_TABLET | Freq: Every evening | ORAL | Status: DC | PRN

## 2020-09-30 MED ORDER — METOPROLOL SUCCINATE ER 50 MG PO TB24
50.0000 mg | ORAL_TABLET | Freq: Two times a day (BID) | ORAL | Status: DC
  Administered 2020-10-01: 50 mg via ORAL
  Filled 2020-09-30: qty 1

## 2020-09-30 MED ORDER — ADULT MULTIVITAMIN W/MINERALS CH
1.0000 | ORAL_TABLET | Freq: Every day | ORAL | Status: DC
  Administered 2020-10-01: 1 via ORAL
  Filled 2020-09-30: qty 1

## 2020-09-30 MED ORDER — ATORVASTATIN CALCIUM 20 MG PO TABS
40.0000 mg | ORAL_TABLET | Freq: Every day | ORAL | Status: DC
  Administered 2020-09-30: 21:00:00 40 mg via ORAL
  Filled 2020-09-30: qty 2

## 2020-09-30 MED ORDER — ACETAMINOPHEN 650 MG RE SUPP: 650.0000 mg | RECTAL | Status: DC | PRN

## 2020-09-30 MED ORDER — VITAMIN B-12 1000 MCG PO TABS
1000.0000 ug | ORAL_TABLET | Freq: Every day | ORAL | Status: DC
  Administered 2020-10-01: 1000 ug via ORAL
  Filled 2020-09-30: qty 1

## 2020-09-30 MED ORDER — POTASSIUM CHLORIDE 20 MEQ PO PACK
60.0000 meq | PACK | Freq: Once | ORAL | Status: AC
  Administered 2020-09-30: 60 meq via ORAL
  Filled 2020-09-30: qty 3

## 2020-09-30 MED ORDER — LORAZEPAM 2 MG/ML IJ SOLN
2.0000 mg | INTRAMUSCULAR | Status: AC | PRN
  Administered 2020-09-30 (×2): 2 mg via INTRAVENOUS
  Filled 2020-09-30 (×2): qty 1

## 2020-09-30 MED ORDER — MONTELUKAST SODIUM 10 MG PO TABS
10.0000 mg | ORAL_TABLET | Freq: Every day | ORAL | Status: DC
  Administered 2020-09-30: 21:00:00 10 mg via ORAL
  Filled 2020-09-30: qty 1

## 2020-09-30 MED ORDER — LORAZEPAM 2 MG/ML IJ SOLN
2.0000 mg | INTRAMUSCULAR | Status: AC | PRN
  Administered 2020-09-30 – 2020-10-01 (×2): 2 mg via INTRAVENOUS
  Filled 2020-09-30 (×2): qty 1

## 2020-09-30 MED ORDER — SODIUM CHLORIDE 0.9% FLUSH
3.0000 mL | Freq: Once | INTRAVENOUS | Status: DC
Start: 2020-09-30 — End: 2020-10-01

## 2020-09-30 MED ORDER — SODIUM CHLORIDE 0.9 % IV SOLN
2000.0000 mg | Freq: Once | INTRAVENOUS | Status: AC
  Administered 2020-09-30: 2000 mg via INTRAVENOUS
  Filled 2020-09-30: qty 20

## 2020-09-30 MED ORDER — GABAPENTIN 300 MG PO CAPS: 600.0000 mg | ORAL_CAPSULE | Freq: Every day | ORAL | Status: DC

## 2020-09-30 MED ORDER — FUROSEMIDE 40 MG PO TABS: 80.0000 mg | ORAL_TABLET | Freq: Two times a day (BID) | ORAL | Status: DC

## 2020-09-30 MED ORDER — ENOXAPARIN SODIUM 60 MG/0.6ML IJ SOSY
0.5000 mg/kg | PREFILLED_SYRINGE | INTRAMUSCULAR | Status: DC
  Administered 2020-09-30: 50 mg via SUBCUTANEOUS
  Filled 2020-09-30: qty 0.6
  Filled 2020-09-30: qty 0.5

## 2020-09-30 MED ORDER — LEVETIRACETAM IN NACL 500 MG/100ML IV SOLN
500.0000 mg | Freq: Two times a day (BID) | INTRAVENOUS | Status: DC
  Administered 2020-09-30 – 2020-10-01 (×2): 500 mg via INTRAVENOUS
  Filled 2020-09-30 (×5): qty 100

## 2020-09-30 MED ORDER — FAMOTIDINE 20 MG PO TABS
20.0000 mg | ORAL_TABLET | Freq: Every day | ORAL | Status: DC
  Administered 2020-09-30: 21:00:00 20 mg via ORAL
  Filled 2020-09-30: qty 1

## 2020-09-30 MED ORDER — CLONAZEPAM 0.125 MG PO TBDP: 0.1250 mg | ORAL_TABLET | Freq: Three times a day (TID) | ORAL | Status: DC

## 2020-09-30 MED ORDER — LEVETIRACETAM IN NACL 500 MG/100ML IV SOLN
500.0000 mg | Freq: Two times a day (BID) | INTRAVENOUS | Status: DC
  Filled 2020-09-30: qty 100

## 2020-09-30 MED ORDER — FUROSEMIDE 40 MG PO TABS
80.0000 mg | ORAL_TABLET | Freq: Two times a day (BID) | ORAL | Status: DC
  Administered 2020-10-01 (×2): 80 mg via ORAL
  Filled 2020-09-30: qty 2
  Filled 2020-09-30: qty 4

## 2020-09-30 MED ORDER — ROPINIROLE HCL 1 MG PO TABS
1.0000 mg | ORAL_TABLET | Freq: Three times a day (TID) | ORAL | Status: DC
  Administered 2020-09-30 – 2020-10-01 (×3): 1 mg via ORAL
  Filled 2020-09-30 (×5): qty 1

## 2020-09-30 MED ORDER — IOHEXOL 350 MG/ML SOLN
100.0000 mL | Freq: Once | INTRAVENOUS | Status: AC | PRN
  Administered 2020-09-30: 100 mL via INTRAVENOUS

## 2020-09-30 MED ORDER — INSULIN ASPART 100 UNIT/ML IJ SOLN
0.0000 [IU] | Freq: Three times a day (TID) | INTRAMUSCULAR | Status: DC
  Administered 2020-10-01 (×2): 1 [IU] via SUBCUTANEOUS
  Filled 2020-09-30 (×2): qty 1

## 2020-09-30 MED ORDER — ONDANSETRON HCL 4 MG PO TABS: 4.0000 mg | ORAL_TABLET | Freq: Four times a day (QID) | ORAL | Status: DC | PRN

## 2020-09-30 MED ORDER — ENOXAPARIN SODIUM 40 MG/0.4ML IJ SOSY: 40.0000 mg | PREFILLED_SYRINGE | INTRAMUSCULAR | Status: DC

## 2020-09-30 MED ORDER — LORAZEPAM 2 MG/ML IJ SOLN: 2.0000 mg | Freq: Once | INTRAMUSCULAR | Status: DC

## 2020-09-30 MED ORDER — ACETAMINOPHEN 325 MG PO TABS
650.0000 mg | ORAL_TABLET | ORAL | Status: DC | PRN
  Administered 2020-10-01 (×2): 650 mg via ORAL
  Filled 2020-09-30 (×2): qty 2

## 2020-09-30 MED ORDER — ONDANSETRON HCL 4 MG/2ML IJ SOLN: 4.0000 mg | Freq: Four times a day (QID) | INTRAMUSCULAR | Status: DC | PRN

## 2020-09-30 MED ORDER — GABAPENTIN 300 MG PO CAPS
300.0000 mg | ORAL_CAPSULE | Freq: Two times a day (BID) | ORAL | Status: DC
  Administered 2020-10-01: 300 mg via ORAL
  Filled 2020-09-30: qty 1

## 2020-09-30 MED ORDER — LEVOTHYROXINE SODIUM 112 MCG PO TABS
224.0000 ug | ORAL_TABLET | Freq: Every day | ORAL | Status: DC
  Filled 2020-09-30 (×2): qty 2

## 2020-09-30 NOTE — ED Notes (Signed)
EEG at bedside.

## 2020-09-30 NOTE — Progress Notes (Signed)
Eeg done 

## 2020-09-30 NOTE — ED Notes (Signed)
Pts meal tray delivered by dietary.

## 2020-09-30 NOTE — ED Notes (Signed)
Pt presents to Ed via EMS with c/o of ABD pain and code stroke. EMS was transporting to [place] where pt had ABD surgery in April when pt started to have a R sided facial droop and slurred speech.   Pt states HX of TIA's. Pt is A&Ox4 at this time.   Last known well 1550. Neurologist at bedside as well as telestroke.

## 2020-09-30 NOTE — ED Notes (Signed)
Pt at CT. VSS.

## 2020-09-30 NOTE — ED Notes (Signed)
Dietary called regarding the patients dinner tray, the meal is to be delivered shortly.

## 2020-09-30 NOTE — Consult Note (Signed)
NEUROLOGY CONSULTATION NOTE   Date of service: September 30, 2020 Patient Name: Marie Clarke MRN:  [id number] DOB:  09/01/1970 Reason for consult: stroke code R sided jerking and weakness Requesting physician: Nance Pear MD _ _ _   _ __   _ __ _ _  __ __   _ __   __ _  History of Present Illness   This is a 50 yo woman w/ hx chronic pain syndrome with thoracic spinal cord stimulator in place (batteries dead), OSA on CPAP, CAD, chronic diastolic HF (EF 99991111), DM2, HTN, HL, incarcerated hernia s/p repair 05/30/20 at [facility] who is BIB EMS as code stroke for R sided jerking f/b R sided weakness. EMS was initially called because patient reported nausea, vomiting, and abdominal pain.  There was concern that she had recurrent strangulated hernia so they were in route to [facility] where she received her surgery before.    While in route she developed rhythmic jerking of her right upper extremity followed by weakness of her right face arm and leg.  This was resolving when she reached the hospital although she still had a right-sided facial droop and some right upper extremity weakness.  She was alert and interactive.  She was taken to CT where she had a right focal seizure witnessed by me involving jerking that was rhythmic of the right upper extremity followed by tonic flexion of the right upper extremity.  During that time she was aphasic and then afterwards she was not making sense when she was talking.  I asked her to remember the words purple umbrella and she was unable to after the event.    CT head showed no acute intracranial process.  CTA head and neck had a poor bolus but did not show any definitive LVO.  Due to concern that she might also have an abdominal process that may preclude tPA a CTA chest abdomen and pelvis was performed to rule out any vascular or GI pathology that may require immediate intervention and preclude tPA.  The ED physician and GI reviewed the images and did not see anything  concerning.  However by this point patient's symptoms were rapidly resolving and therefore tPA was not administered due to to the fact that her symptoms were felt to be secondary to postictal state with Todd's paralysis and not stroke.  Upon further questioning patient states that she has had 4 total episodes of this in the past that were previously diagnosed as TIA but based on today's events are actually consistent with seizure.   ROS   Per HPI; all other systems reviewed and were negative  Past History   Past Medical History:  Diagnosis Date  . Anemia   . Anginal pain ([place])   . Anxiety   . Arthritis   . Asthma   . Bilateral lower extremity edema   . Bipolar disorder ([place])   . CAD (coronary artery disease) unk  . CHF (congestive heart failure) ([place])   . Chronic pain syndrome   . COPD (chronic obstructive pulmonary disease) ([facility])    emphysema  . DDD (degenerative disc disease), lumbar   . Depression unk  . Diabetes mellitus without complication ([place])   . Diabetes mellitus, type II ([place])   . Drug overdose 2013-05-12   after dad died  . Dysrhythmia    history of tachy arrythmias  . Fibromyalgia   . GERD (gastroesophageal reflux disease)   . Headache    chronic migraines  . Hyperlipidemia   .  Hypertension   . Hypothyroidism   . Left leg pain 04/29/2014  . MI (myocardial infarction) ([place]) 2007  . Muscle ache 09/16/2014  . Osteoporosis   . Overactive bladder   . Pancreatitis unk  . Panic attacks   . PTSD (post-traumatic stress disorder)   . Reflex sympathetic dystrophy   . Renal cyst, left   . Renal insufficiency    ckd stage iii  . Restless legs syndrome   . Sleep apnea 06/2019   uses cpap  . Sleep apnea   . Stroke ([place]) 2010   TIA x 2. no residual deficits  . Suicide attempt [facility])    after father died.  . Thyroid disease    thyroid nodule  . TIA (transient ischemic attack) unk  . TIA (transient ischemic attack)    Past Surgical History:  Procedure Laterality  Date  . ABDOMINAL HYSTERECTOMY    . CARDIAC CATHETERIZATION    . HERNIA REPAIR  07/15/2017   [facility]  . lumbar facet     several  . MELANOMA EXCISION  03/2020   OUTSIDE OF RECTUM  . prolapse rectum surgery N/A 08/2014  . THORACIC LAMINECTOMY FOR SPINAL CORD STIMULATOR N/A 07/27/2019   Procedure: THORACIC SPINAL CORD STIMULATOR PLACEMENT WITH RIGHT FLANK PULSE GENERATOR;  Surgeon: [name], MD;  Location: [facility] ORS;  Service: Neurosurgery;  Laterality: N/A;  . TONSILLECTOMY     Family History  Problem Relation Age of Onset  . Diabetes Mellitus II Mother   . CAD Mother   . Sleep apnea Mother   . Osteoarthritis Mother   . Osteoporosis Mother   . Anxiety disorder Mother   . Depression Mother   . Bipolar disorder Mother   . Bipolar disorder Father   . Hypertension Father   . Depression Father   . Anxiety disorder Father   . Post-traumatic stress disorder Sister    Social History   Socioeconomic History  . Marital status: Single    Spouse name: Not on file  . Number of children: 0  . Years of education: Not on file  . Highest education level: High school graduate  Occupational History    Comment: not employed  Tobacco Use  . Smoking status: Former    Packs/day: 0.50    Types: Cigarettes    Quit date: 09/01/2018    Years since quitting: 2.0  . Smokeless tobacco: Never  . Tobacco comments:    Patient quit smoking 09/01/2018  Vaping Use  . Vaping Use: Never used  Substance and Sexual Activity  . Alcohol use: No    Alcohol/week: 0.0 standard drinks  . Drug use: No  . Sexual activity: Not Currently  Other Topics Concern  . Not on file  Social History Narrative   Patient lives with aunt and uncle.   Social Determinants of Health   Financial Resource Strain: Not on file  Food Insecurity: Not on file  Transportation Needs: Not on file  Physical Activity: Not on file  Stress: Not on file  Social Connections: Not on file   Allergies  Allergen Reactions  . Diazepam  Hives and Nausea And Vomiting  . Ziprasidone Hcl Other (See Comments) and Itching    CONFUSED CONFUSED Other reaction(s): Other (See Comments) CONFUSED CONFUSED   . Azithromycin Hives  . Divalproex Sodium Hives  . Levofloxacin Hives and Rash    1/31: would like to retry since not taking valium  . Lisinopril     Medicine cause kidney failure  .  Metronidazole Hives    1/31: would like to retry since she is no longer taking valium  . Sulfa Antibiotics Hives  . Valproic Acid Itching and Rash  . Cephalexin Hives  . Ciprofloxacin Hives  . Doxycycline Rash  . Penicillins Hives    Has patient had a PCN reaction causing immediate rash, facial/tongue/throat swelling, SOB or lightheadedness with hypotension: No Has patient had a PCN reaction causing severe rash involving mucus membranes or skin necrosis: No Has patient had a PCN reaction that required hospitalization: No Has patient had a PCN reaction occurring within the last 10 years: No If all of the above answers are "NO", then may proceed with Cephalosporin use.     Medications   (Not in a hospital admission)    Vitals   Vitals:   09/30/20 1730 09/30/20 1800 09/30/20 1830 09/30/20 1920  BP: 98/63 97/65 (!) 98/57 102/74  Pulse: (!) 106 (!) 101 (!) 105 (!) 108  Resp: '19 20 19 18  '$ Temp:    97.9 F (36.6 C)  TempSrc:    Oral  SpO2: 100% 97% 98% 99%     There is no height or weight on file to calculate BMI.  Physical Exam   Exam performed after seizure-like activity in CT scan  Physical Exam Gen: alert, oriented x4 then became unable to speak during seizure-like activity HEENT: Atraumatic, normocephalic;mucous membranes moist; oropharynx clear, tongue without atrophy or fasciculations. Neck: Supple, trachea midline. Resp: CTAB, no w/r/r CV: RRR, no m/g/r; nml S1 and S2. 2+ symmetric peripheral pulses. Abd: soft/NT/ND; nabs x 4 quad Extrem: Nml bulk; no cyanosis, clubbing, or edema.  Neuro: *MS: A&O x4. Follows  multi-step commands.  *Speech: moderate dysarthria with expressive >> receptive aphasia *CN: PERRLA, blinks to threat bilat, EOMI w/o nystagmus, no ptosis, R UMN facial, hearing intact to voice *Motor:   LUE and LLE drift to bed, no movement against gravity RUE and RLE *Sensory: Impaired to LT RUE and RLE *Coordination:  UTA *Reflexes:  1+ and symmetric throughout without clonus; toes down-going bilat *Gait: UTA  NIHSS  1a Level of Conscious.: 0 1b LOC Questions: 2 1c LOC Commands: 1 2 Best Gaze: 0 3 Visual: 0 4 Facial Palsy: 2 5a Motor Arm - left: 1 5b Motor Arm - Right: 3 6a Motor Leg - Left: 1 6b Motor Leg - Right: 3 7 Limb Ataxia: 0 8 Sensory: 1 9 Best Language: 2 10 Dysarthria: 2 11 Extinct. and Inatten.: 0  TOTAL: 18   Premorbid mRS = 2  Sx rapidly resolved after CT scan upon return to ED room. Patient slowly improved over 30 min at which point she was A&Ox4, able to provide lucid hx, and her R sided weakness was rapidly improving as well.   Labs   CBC:  Recent Labs  Lab 09/30/20 1716  WBC 8.7  NEUTROABS 6.1  HGB 11.5*  HCT 34.7*  MCV 88.5  PLT 99991111    Basic Metabolic Panel:  Lab Results  Component Value Date   NA 135 09/30/2020   K 3.0 (L) 09/30/2020   CO2 29 09/30/2020   GLUCOSE 97 09/30/2020   BUN 14 09/30/2020   CREATININE 0.89 09/30/2020   CALCIUM 8.6 (L) 09/30/2020   GFRNONAA >60 09/30/2020   GFRAA >60 10/25/2019   Lipid Panel: No results found for: LDLCALC HgbA1c:  Lab Results  Component Value Date   HGBA1C 6.5 (H) 03/29/2020   Urine Drug Screen:     Component Value Date/Time  LABOPIA NONE DETECTED 04/02/2020 2330   COCAINSCRNUR NONE DETECTED 04/02/2020 2330   LABBENZ NONE DETECTED 04/02/2020 2330   AMPHETMU NONE DETECTED 04/02/2020 2330   THCU NONE DETECTED 04/02/2020 2330   LABBARB NONE DETECTED 04/02/2020 2330    Alcohol Level     Component Value Date/Time   ETH <10 09/29/2018 0826     Impression   50 yo woman w/  hx chronic pain syndrome with thoracic spinal cord stimulator in place (batteries dead), OSA on CPAP, CAD, chronic diastolic HF (EF 99991111), DM2, HTN, HL, incarcerated hernia s/p repair 05/30/20 at [facility] who is BIB EMS as code stroke for R sided jerking f/b R sided weakness. tPA not administered 2/2 rapidly improving sx and the fact that sx were felt to be 2/2 seizure rather than stroke. Based on witnessed event, amnesia of the event, post-ictal lethargy and Todd's paralysis suspect this was epileptic in nature, although PNES is on the differential.  Recommendations   - Admit to hospitalist service for seizure workup - rEEG orderred and will be performed in ED - After EEG completed please order MRI brain if patient's spinal cord stimulator is compatible - S/p keppra 20 mg/kg load in ED - Continue keppra '500mg'$  bid - Seizure precautions - '2mg'$  IV ativan and call rapid response if patient has another seizure. IV ativan is on shortage however I checked with pharmacy and it is still appropriate to use for acute seizure activity ______________________________________________________________________   Thank you for the opportunity to take part in the care of this patient. If you have any further questions, please contact the neurology consultation attending.  Signed,  Su Monks, MD [facility] [phone]  If 7pm- 7am, please page neurology on call as listed in [place].

## 2020-09-30 NOTE — ED Notes (Signed)
Pt returns from CT and there has been an improvement in R sided weakness and deficit.   Pt now has no more R sided facial droop. Speech is also clearing up from intial presentation. Pt is able to move all extremities at this time. R sided weakness is better and grip strengths are becoming stronger.

## 2020-09-30 NOTE — Progress Notes (Signed)
Chaplain Maggie made initial at bedside per Order Requisition request from RN. Patient confirmed she is scared. Chaplain shared several verses of psalm 139 and prayed with patient before she left for an MRI. Chaplain expects to follow up and is available per on call chaplain at [phone]

## 2020-09-30 NOTE — ED Notes (Signed)
Pt started shaking had a slight eye roll and pt started crying and stating "this is crazy". MD aware. Holding benzos at this time.

## 2020-09-30 NOTE — ED Provider Notes (Signed)
[facility] Emergency Department Provider Note   ____________________________________________   I have reviewed the triage vital signs and the nursing notes.   HISTORY  Chief Complaint Code Stroke   History limited by:  aphagia   HPI Marie Clarke is a 50 y.o. female who presents to the emergency department today via EMS as a code stroke. Patient had initially called 911 today because of concern for abdominal pain. It had apparently started this morning. Had been accompanied by nausea and vomiting. The patient has history of hernia surgery at [facility]. Thus EMS was transporting patient to [facility] when she started having right sided upper extremity shaking. EMS then noticed she developed facial droop and right upper extremity weakness. Patient has history of TIA. Because of this the decision was made to call Code Stroke and transport here.    Records reviewed. Per medical record review patient has a history of COPD, CHF.   Past Medical History:  Diagnosis Date   Anemia    Anginal pain (HCC)    Anxiety    Arthritis    Asthma    Bilateral lower extremity edema    Bipolar disorder ([place])    CAD (coronary artery disease) unk   CHF (congestive heart failure) (HCC)    Chronic pain syndrome    COPD (chronic obstructive pulmonary disease) ([place])    emphysema   DDD (degenerative disc disease), lumbar    Depression unk   Diabetes mellitus without complication ([place])    Diabetes mellitus, type II ([place])    Drug overdose May 09, 2013   after dad died   Dysrhythmia    history of tachy arrythmias   Fibromyalgia    GERD (gastroesophageal reflux disease)    Headache    chronic migraines   Hyperlipidemia    Hypertension    Hypothyroidism    Left leg pain 04/29/2014   MI (myocardial infarction) ([place]) 05/09/2005   Muscle ache 09/16/2014   Osteoporosis    Overactive bladder    Pancreatitis unk   Panic attacks    PTSD (post-traumatic stress disorder)    Reflex sympathetic dystrophy     Renal cyst, left    Renal insufficiency    ckd stage iii   Restless legs syndrome    Sleep apnea 06/2019   uses cpap   Sleep apnea    Stroke ([place]) May 09, 2008   TIA x 2. no residual deficits   Suicide attempt [facility])    after father died.   Thyroid disease    thyroid nodule   TIA (transient ischemic attack) unk   TIA (transient ischemic attack)     Patient Active Problem List   Diagnosis Date Noted   Pain in thoracic spine 05/02/2020   Rectal pain 04/07/2020   Partial small bowel obstruction ([place]) 03/29/2020   Adrenal nodule ([place]) 03/19/2020   Hypokalemia    SBO (small bowel obstruction) ([place]) 12/02/2019   Chronic pain 12/02/2019   Presence of neurostimulator (SCS) (June 2021) 10/06/2019   Pain due to any device, implant or graft 09/21/2019   Anxiety 09/10/2019   Involuntary movements 09/10/2019   Abnormal involuntary movement 08/18/2019   Bipolar disorder, in full remission, most recent episode mixed ([place]) 08/11/2019   Lumbosacral radiculopathy at L5 (Right) 04/09/2019   Chronic migraine 03/12/2019   Gout 01/21/2019   Noncompliance with treatment regimen 12/29/2018   BMI 40.0-44.9, adult ([place]) 11/19/2018   AKI (acute kidney injury) ([place]) 11/17/2018   Postural dizziness with presyncope 09/29/2018   Bipolar I  disorder, most recent episode mixed ([place]) 09/10/2018   Panic attacks 09/10/2018   Chronic lumbosacral L5-S1 IVD protrusion (Bilateral) 08/25/2018   Lumbar lateral recess stenosis (L5-S1) (Bilateral) 08/25/2018   Wound disruption 08/05/2018   Osteoarthritis involving multiple joints 07/10/2018   Long term current use of non-steroidal anti-inflammatories (NSAID) 07/10/2018   NSAID induced gastritis 07/10/2018   DDD (degenerative disc disease), lumbosacral 04/22/2018   Disease related peripheral neuropathy 11/13/2017   Gastroesophageal reflux disease without esophagitis 11/13/2017   Occipital headache (Bilateral) 11/13/2017   Cervicogenic headache (Bilateral) (L>R)  11/13/2017   History of postoperative nausea 10/22/2017   Chronic shoulder pain (5th area of Pain) (Bilateral) (L>R) 10/09/2017   Ankle joint instability (Left) 10/09/2017   Ankle sprain, sequela (Left) 10/09/2017   History of psychiatric symptoms 10/09/2017   Chronic pain syndrome 10/02/2017   Spondylosis without myelopathy or radiculopathy, lumbosacral region 10/02/2017   Chronic musculoskeletal pain 10/02/2017   Elevated C-reactive protein (CRP) 09/10/2017   Elevated sed rate 09/10/2017   Chronic neck pain (4th area of Pain) (Bilateral) (L>R) 09/09/2017   Pharmacologic therapy 09/09/2017   Disorder of skeletal system 09/09/2017   Problems influencing health status 09/09/2017   Long term current use of opiate analgesic 09/09/2017   Tobacco use disorder 07/16/2017   Ventral hernia without obstruction or gangrene 06/25/2017   Strain of extensor muscle, fascia and tendon of left index finger at wrist and hand level, initial encounter 05/16/2017   Sepsis ([place]) 04/14/2017   Osteopenia 04/03/2017   Hypotension 09/17/2016   Contusion of knee (Left) 10/12/2015   Strain of knee (Left) 10/12/2015   Incidental lung nodule 04/28/2015   Chronic low back pain (1ry area of Pain) (Bilateral) (L>R) 03/28/2015   Chronic lower extremity pain (Referred) (2ry area of Pain) (Left) 03/28/2015   Abdominal wound dehiscence 03/28/2015   Encounter for pain management planning 03/28/2015   Morbid obesity ([place]) 03/28/2015   Abnormal CT scan, lumbar spine 03/28/2015   Lumbar facet hypertrophy 03/28/2015   Lumbar facet syndrome (Bilateral) (L>R) 03/28/2015   Lumbar foraminal stenosis (Bilateral) (L5-S1) 03/28/2015   Chronic ankle pain (3ry area of Pain) (Left) 03/28/2015   Neurogenic pain 03/28/2015   Neuropathic pain 03/28/2015   Myofascial pain 03/28/2015   History of suicide attempt 03/28/2015   PTSD (post-traumatic stress disorder) 01/13/2015   Abnormal gait 12/15/2014   Congestive heart failure  ([place]) 11/15/2014   Abdominal wall abscess 09/20/2014   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   Diabetes mellitus, type 2 ([place]) 08/13/2014   Bipolar affective disorder ([place]) 08/13/2014   Type 2 diabetes mellitus ([place]) 08/13/2014   Rectal prolapse 08/09/2014   Rectal bleeding 08/09/2014   Rectal bleed 08/09/2014   Affective bipolar disorder ([place]) 08/05/2014   Arteriosclerosis of coronary artery 08/05/2014   CCF (congestive cardiac failure) ([place]) 08/05/2014   Chronic kidney disease 08/05/2014   Detrusor muscle hypertonia 08/05/2014   Apnea, sleep 08/05/2014   Temporary cerebral vascular dysfunction 08/05/2014   Polypharmacy 04/29/2014   Other long term (current) drug therapy 04/29/2014   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   Chronic kidney disease, stage III (moderate) ([place]) 12/14/2013   Controlled diabetes mellitus type II without complication ([place]) 99991111   Essential (primary) hypertension 12/03/2013   Adult hypothyroidism 12/03/2013   Controlled type 2 diabetes mellitus without complication ([place]) 99991111    Past Surgical History:  Procedure Laterality Date   ABDOMINAL HYSTERECTOMY     CARDIAC CATHETERIZATION     HERNIA REPAIR  07/15/2017   [facility]   lumbar  facet     several   MELANOMA EXCISION  03/2020   OUTSIDE OF RECTUM   prolapse rectum surgery N/A 08/2014   THORACIC LAMINECTOMY FOR SPINAL CORD STIMULATOR N/A 07/27/2019   Procedure: THORACIC SPINAL CORD STIMULATOR PLACEMENT WITH RIGHT FLANK PULSE GENERATOR;  Surgeon: [name], MD;  Location: [facility] ORS;  Service: Neurosurgery;  Laterality: N/A;   TONSILLECTOMY      Prior to Admission medications   Medication Sig Start Date End Date Taking? Authorizing Provider  Albuterol Sulfate (PROAIR RESPICLICK) [id number] (90 Base) MCG/ACT AEPB Inhale 2 puffs into the lungs every 6 (six) hours as needed. 10/26/19   [provider]  aspirin EC 81 MG tablet Take 81 mg by mouth daily. Swallow whole.    [provider]   atorvastatin (LIPITOR) 40 MG tablet Take 40 mg by mouth daily. 04/05/20   [provider]  benztropine (COGENTIN) 0.5 MG tablet Take 1 tablet (0.5 mg total) by mouth 2 (two) times daily as needed for tremors. Patient not taking: Reported on 09/20/2020 01/18/20   Ursula Alert, MD  busPIRone (BUSPAR) 15 MG tablet Take 1 tablet (15 mg total) by mouth 2 (two) times daily. Patient not taking: Reported on 09/23/2020 12/28/19   Ursula Alert, MD  CAPLYTA 42 MG capsule Take 42 mg by mouth at bedtime. 09/20/20   [provider]  cholecalciferol (VITAMIN D3) 25 MCG (1000 UNIT) tablet Take 1,000 Units by mouth daily.    [provider]  clonazepam (KLONOPIN) 0.125 MG disintegrating tablet Take 0.125 mg by mouth 3 (three) times daily. 08/25/20   [provider]  COVID-19 mRNA Vac-TriS, Pfizer, (PFIZER-BIONT COVID-19 VAC-TRIS) SUSP injection Inject into the muscle. 09/16/20   Carlyle Basques, MD  cyanocobalamin 1000 MCG tablet Take 1,000 mcg by mouth daily.    [provider]  desvenlafaxine (PRISTIQ) 100 MG 24 hr tablet TAKE 1 TABLET BY MOUTH ONCE A DAY Patient not taking: Reported on 09/20/2020 04/08/20   Ursula Alert, MD  dicyclomine (BENTYL) 10 MG capsule Take 10 mg by mouth 3 (three) times daily with meals. 10/16/19   [provider]  Dulaglutide (TRULICITY) 1.5 0000000 SOPN once a week.    [provider]  esomeprazole (NEXIUM) 40 MG capsule Take by mouth. 12/23/19 03/29/20  [provider]  famotidine (PEPCID) 20 MG tablet Take 20 mg by mouth at bedtime. PT TAKES 2 TABLETS AT NIGHT    [provider]  furosemide (LASIX) 80 MG tablet Take 80 mg by mouth 2 (two) times daily. 09/08/20   [provider]  gabapentin (NEURONTIN) 300 MG capsule Take 300-600 mg by mouth See admin instructions. Take 300 mg in the morning and noon and 600 mg at bedtime 08/25/20   [provider]  ibuprofen (ADVIL) 800 MG tablet Take 800 mg  by mouth 2 (two) times daily. 09/08/20   [provider]  lactulose (CHRONULAC) 10 GM/15ML solution Take 30 mLs (20 g total) by mouth daily as needed for mild constipation. Patient not taking: Reported on 09/20/2020 04/28/20   Paulette Blanch, MD  levocetirizine (XYZAL) 5 MG tablet Take 5 mg by mouth at bedtime. 01/08/20   [provider]  levothyroxine (SYNTHROID) 112 MCG tablet Take 224 mcg by mouth daily before breakfast. 11/10/19 11/09/20  [provider]  LORazepam (ATIVAN) 0.5 MG tablet Take 1 tablet (0.5 mg total) by mouth as directed. Take 1 tablet 3-4 times a week only for severe panic attacks Patient not taking: Reported  on 09/20/2020 01/01/20   Ursula Alert, MD  metoprolol succinate (TOPROL-XL) 50 MG 24 hr tablet Take 50 mg by mouth 2 (two) times daily. 04/29/20 04/29/21  [provider]  metoprolol tartrate (LOPRESSOR) 100 MG tablet Take 1 tablet ('100mg'$ ) by mouth 2 hours prior to cardiac CT. Patient not taking: Reported on 09/20/2020 05/03/20   Callwood, Karma Greaser D, MD  montelukast (SINGULAIR) 10 MG tablet Take 10 mg by mouth at bedtime.    [provider]  Multiple Vitamin (MULTIVITAMIN) tablet Take 1 tablet by mouth daily.    [provider]  nitroGLYCERIN (NITROSTAT) 0.4 MG SL tablet Place 0.4 mg under the tongue every 5 (five) minutes as needed for chest pain. 04/21/20   [provider]  omeprazole (PRILOSEC) 40 MG capsule Take 40 mg by mouth 2 (two) times daily.    [provider]  oxyCODONE-acetaminophen (PERCOCET/ROXICET) 5-325 MG tablet Take 1 tablet by mouth every 6 (six) hours as needed for severe pain. Patient not taking: Reported on 09/20/2020 05/07/20   Delman Kitten, MD  potassium chloride SA (KLOR-CON) 20 MEQ tablet Take 40 mEq by mouth 2 (two) times daily. 12/25/19   [provider]  pregabalin (LYRICA) 150 MG capsule Take 1 capsule (150 mg total) by mouth 3 (three) times daily. Do not take gabapentin (Neurontin)  while taking this medication. Patient not taking: Reported on 09/20/2020 03/04/20 06/02/20  Milinda Pointer, MD  promethazine (PHENERGAN) 25 MG tablet Take 12.5 mg by mouth every 6 (six) hours as needed for nausea or vomiting (reflux). 03/26/20   [provider]  QUEtiapine (SEROQUEL) 100 MG tablet Take 100 mg by mouth at bedtime. Patient not taking: Reported on 09/23/2020 11/25/19   [provider]  QUEtiapine (SEROQUEL) 25 MG tablet Take 25 mg by mouth See admin instructions. Take in morning and lunch Patient not taking: Reported on 09/23/2020    [provider]  ramelteon (ROZEREM) 8 MG tablet Take 8 mg by mouth at bedtime. 09/17/20   [provider]  rizatriptan (MAXALT) 10 MG tablet Take 10 mg by mouth daily as needed for migraine. 09/08/20   [provider]  rOPINIRole (REQUIP) 1 MG tablet Take 1 mg by mouth 3 (three) times daily. 12/10/19   [provider]  traMADol (ULTRAM) 50 MG tablet Take 50 mg by mouth every 6 (six) hours as needed. 08/19/20   [provider]  traZODone (DESYREL) 50 MG tablet Take 50-150 mg by mouth at bedtime as needed for sleep. 09/27/20   [provider]  vitamin C (ASCORBIC ACID) 500 MG tablet Take 500 mg by mouth daily.    [provider]  vitamin E 180 MG (400 UNITS) capsule Take 400 Units by mouth daily.    [provider]  zolpidem (AMBIEN) 10 MG tablet Take 10 mg by mouth at bedtime as needed for sleep. 09/29/20   [provider]    Allergies Diazepam, Ziprasidone hcl, Azithromycin, Divalproex sodium, Levofloxacin, Lisinopril, Metronidazole, Sulfa antibiotics, Valproic acid, Cephalexin, Ciprofloxacin, Doxycycline, and Penicillins  Family History  Problem Relation Age of Onset   Diabetes Mellitus II Mother    CAD Mother    Sleep apnea Mother    Osteoarthritis Mother    Osteoporosis Mother    Anxiety disorder Mother    Depression Mother    Bipolar disorder Mother     Bipolar disorder Father    Hypertension Father    Depression Father    Anxiety disorder Father  Post-traumatic stress disorder Sister     Social History Social History   Tobacco Use   Smoking status: Former    Packs/day: 0.50    Types: Cigarettes    Quit date: 09/01/2018    Years since quitting: 2.0   Smokeless tobacco: Never   Tobacco comments:    Patient quit smoking 09/01/2018  Vaping Use   Vaping Use: Never used  Substance Use Topics   Alcohol use: No    Alcohol/week: 0.0 standard drinks   Drug use: No    Review of Systems Constitutional: No fever/chills Eyes: No visual changes. ENT: No sore throat. Cardiovascular: Denies chest pain. Respiratory: Denies shortness of breath. Gastrointestinal: Positive for abdominal pain, nausea and vomiting.  Genitourinary: Negative for dysuria. Musculoskeletal: Negative for back pain. Skin: Negative for rash. Neurological: Positive for right sided weakness, facial droop.  ____________________________________________   PHYSICAL EXAM:  VITAL SIGNS: ED Triage Vitals [09/30/20 1608]  Enc Vitals Group     BP 128/75     Pulse Rate (!) 115     Resp 13     Temp      Temp src      SpO2 100 %   Constitutional: Alert and oriented.  Eyes: Conjunctivae are normal.  ENT      Head: Normocephalic and atraumatic.      Nose: No congestion/rhinnorhea.      Mouth/Throat: Mucous membranes are moist.      Neck: No stridor. Hematological/Lymphatic/Immunilogical: No cervical lymphadenopathy. Cardiovascular: Normal rate, regular rhythm.  No murmurs, rubs, or gallops.  Respiratory: Normal respiratory effort without tachypnea nor retractions. Breath sounds are clear and equal bilaterally. No wheezes/rales/rhonchi. Gastrointestinal: Soft and minimally tender in the right lower quadrant.  Genitourinary: Deferred Musculoskeletal: Normal range of motion in all extremities. No lower extremity edema. Neurologic:  Aphagia, some slurred and  stuttering speech.  Skin:  Skin is warm, dry and intact. No rash noted. Psychiatric: Mood and affect are normal. Speech and behavior are normal. Patient exhibits appropriate insight and judgment.  ____________________________________________    LABS (pertinent positives/negatives)  Magnesium 1.8 INR 1.1 CMP na 135, k 3.0, glu 97, cr 0.89 CBC wbc 8.7, hgb 11.5, plt 263  ____________________________________________   EKG  I, Nance Pear, attending physician, personally viewed and interpreted this EKG  EKG Time: 1702 Rate: 109 Rhythm: sinus tachycardia Axis: normal Intervals: qtc 550 QRS: q waves V1, II, III, aVF ST changes: no st elevation Impression: abnormal ekg ____________________________________________    RADIOLOGY  CT head Mild hypodensity in right temporal lobe, thought more likely to represent artifact  CT angio head/neck Calculated penumbra within both inferior lobes thought to be artifact  CT angio chest/abd/pel Two abdominal wall hernias without evidence of strangulation or incarceration. Pulmonary nodule. Left adrenal gland nodule. ____________________________________________   PROCEDURES  Procedures  CRITICAL CARE Performed by: Nance Pear   Total critical care time: 30 minutes  Critical care time was exclusive of separately billable procedures and treating other patients.  Critical care was necessary to treat or prevent imminent or life-threatening deterioration.  Critical care was time spent personally by me on the following activities: development of treatment plan with patient and/or surrogate as well as nursing, discussions with consultants, evaluation of patient's response to treatment, examination of patient, obtaining history from patient or surrogate, ordering and performing treatments and interventions, ordering and review of laboratory studies, ordering and review of radiographic studies, pulse oximetry and re-evaluation of  patient's condition.  ____________________________________________   INITIAL IMPRESSION /  ASSESSMENT AND PLAN / ED COURSE  Pertinent labs & imaging results that were available during my care of the patient were reviewed by me and considered in my medical decision making (see chart for details).   Patient presented to the emergency department today because of concerns for possible stroke.  On exam patient did have some aphasia.  Patient was called a code stroke and Dr. Quinn Axe with neurology was present for initial evaluation upon arrival to the emergency department.  There was some concern for abnormal CT head and CT angio head and neck.  Additionally patient was complaining of some abdominal pain.  Because of this in discussion with Dr. Quinn Axe the decision was made to get a CT angio chest abdomen pelvis in addition to evaluate for possible dissection.  Does not show any acute vascular disorder.  Did show 2 abdominal wall hernias however no evidence of strangulation or incarceration.  At this time will plan on admission to the hospital service for further neurologic work-up.  ____________________________________________   FINAL CLINICAL IMPRESSION(S) / ED DIAGNOSES  Final diagnoses:  Seizure-like activity ([place])  Abdominal pain, unspecified abdominal location     Note: This dictation was prepared with Dragon dictation. Any transcriptional errors that result from this process are unintentional     Nance Pear, MD 09/30/20 1905

## 2020-09-30 NOTE — Procedures (Signed)
Routine EEG Report  Marie Clarke is a 50 y.o. female with a history of spells who is undergoing an EEG to evaluate for seizures.  Report: This EEG was acquired with electrodes placed according to the International 10-20 electrode system (including Fp1, Fp2, F3, F4, C3, C4, P3, P4, O1, O2, T3, T4, T5, T6, A1, A2, Fz, Cz, Pz). The following electrodes were missing or displaced: none.  The occipital dominant rhythm was 8.5 Hz. This activity is reactive to stimulation. Drowsiness was manifested by background fragmentation; deeper stages of sleep were manifested by K complexes and sleep spindles. There was no focal slowing. There were no interictal epileptiform discharges. There were no electrographic seizures identified. There was no abnormal response to photic stimulation or hyperventilation.   Impression: This EEG was obtained while awake and asleep and is normal.    Clinical Correlation: Normal EEGs, however, do not rule out epilepsy.  Su Monks, MD [facility] [phone]  If 7pm- 7am, please page neurology on call as listed in [place].

## 2020-09-30 NOTE — Progress Notes (Signed)
Chaplain Maggie responded to code blue to MRI2 where patient was experiencing a seizure. During a moment of calm the patient expressed that she is scared. Chaplain affirmed her feelings and confirmed that she is not alone. Chaplain offered silent prayer and non-anxious presence walking with patient as staff brought her back to her room. Continued support available per on call chaplain at [phone].

## 2020-09-30 NOTE — H&P (Signed)
History and Physical    Marie Clarke DUK:[id number] DOB: 1970/05/09 DOA: 09/30/2020  PCP: Sharyne Peach, MD  Patient coming from: Home via EMS  I have personally briefly reviewed patient's old medical records in [place]  Chief Complaint: Right facial droop  HPI: Marie Clarke is a 50 y.o. female with medical history significant for CAD, chronic diastolic CHF (EF 37-62%), T2DM, HTN, HLD, hypothyroidism, COPD, bipolar disorder/depression/anxiety, chronic pain syndrome s/p thoracic spinal cord stimulator placement, OSA on CPAP, and incarcerated ventral hernia s/p repair 05/30/2020 who presented to the ED for evaluation of right facial droop and right-sided weakness.  Patient reports new onset of nausea and vomiting with mild lower abdominal pain beginning earlier today.  She had hernia surgery at [facility] in April 2022 and thought this was related to her hernia.  She called EMS and was being transported to [facility] when she developed rhythmic jerking of her right upper extremity followed by weakness of her right face, arm, and leg.  She was rerouted to [facility] ED as a code stroke.  She was seen by neurology on arrival in which time she was noted to have continued right-sided facial droop with some right upper extremity weakness.  She was alert and interactive.  She was taken to CT where she had a right focal seizure witnessed by neurology.  She was aphasic and then afterwards did not make sense when speaking.  She underwent imaging with CT head, CTA head/neck, and CTA chest/abdomen/pelvis as below.  Patient was given IV Keppra load.  On my evaluation she is back to baseline, alert and fully oriented.  She is giving good history with clear speech.  She denies any further nausea, vomiting, abdominal pain.  He tells me that she is scheduled to have her spinal cord stimulator removed on 10/03/2020.  She otherwise denies any chest pain or dyspnea.  ED Course:  Initial vitals showed BP 128/75, pulse 115, RR 13,  temp not recorded, SPO2 100% on room air.  Labs showed WBC 8.7, hemoglobin 11.5, platelets 263,000, sodium 135, potassium 3.0, bicarb 29, BUN 14, creatinine 0.89, serum glucose 97, AST 17, ALT 17, alk phos 127, total bilirubin 0.6.  SARS-CoV-2 PCR panel ordered and pending collection.  Patient arrived as a code stroke.  CT head without contrast showed a mild hypodensity in the right temporal lobe which was favored to be artifactual.  No definite acute infarct or hemorrhage seen.  CTA head/neck was a suboptimal evaluation due to contrast bolus timing.  No LVO or hemodynamically significant stenosis in the neck seen.  Perfusion imaging demonstrated a calculated penumbra within both inferior lobes which was also favored to be artifactual.  CTA chest/abdomen/pelvis was negative for acute vascular abnormality.  2 supraumbilical ventral wall hernias were seen, 1 containing fat and other containing short loop of small bowel with no findings of associated ischemia or bowel obstruction.  Incidental 7 mm lingular nodule and increased interval size of an indeterminate 2 cm left adrenal gland nodule also noted.  Per EDP, general surgery reviewed CT abdomen images and did not see any acutely concerning findings regarding abdominal wall hernias.  Neurology were consulted and patient was loaded with IV Keppra 2000 mg once.  Medical admission for seizure work-up was recommended.  EEG was performed and interpretation pending.  The hospitalist service was consulted to admit for further evaluation and management.  Review of Systems: All systems reviewed and are negative except as documented in history of present illness above.  Past Medical History:  Diagnosis Date   Anemia    Anginal pain (HCC)    Anxiety    Arthritis    Asthma    Bilateral lower extremity edema    Bipolar disorder ([place])    CAD (coronary artery disease) unk   CHF (congestive heart failure) (HCC)    Chronic pain syndrome    COPD (chronic  obstructive pulmonary disease) ([place])    emphysema   DDD (degenerative disc disease), lumbar    Depression unk   Diabetes mellitus without complication ([place])    Diabetes mellitus, type II ([place])    Drug overdose 04/23/13   after dad died   Dysrhythmia    history of tachy arrythmias   Fibromyalgia    GERD (gastroesophageal reflux disease)    Headache    chronic migraines   Hyperlipidemia    Hypertension    Hypothyroidism    Left leg pain 04/29/2014   MI (myocardial infarction) ([place]) Apr 23, 2005   Muscle ache 09/16/2014   Osteoporosis    Overactive bladder    Pancreatitis unk   Panic attacks    PTSD (post-traumatic stress disorder)    Reflex sympathetic dystrophy    Renal cyst, left    Renal insufficiency    ckd stage iii   Restless legs syndrome    Sleep apnea 06/2019   uses cpap   Sleep apnea    Stroke ([place]) 04-23-08   TIA x 2. no residual deficits   Suicide attempt [facility])    after father died.   Thyroid disease    thyroid nodule   TIA (transient ischemic attack) unk   TIA (transient ischemic attack)     Past Surgical History:  Procedure Laterality Date   ABDOMINAL HYSTERECTOMY     CARDIAC CATHETERIZATION     HERNIA REPAIR  07/15/2017   [facility]   lumbar facet     several   MELANOMA EXCISION  2020/04/23   OUTSIDE OF RECTUM   prolapse rectum surgery N/A 08/2014   THORACIC LAMINECTOMY FOR SPINAL CORD STIMULATOR N/A 07/27/2019   Procedure: THORACIC SPINAL CORD STIMULATOR PLACEMENT WITH RIGHT FLANK PULSE GENERATOR;  Surgeon: [name], MD;  Location: [facility] ORS;  Service: Neurosurgery;  Laterality: N/A;   TONSILLECTOMY      Social History:  reports that she quit smoking about 2 years ago. Her smoking use included cigarettes. She smoked an average of .5 packs per day. She has never used smokeless tobacco. She reports that she does not drink alcohol and does not use drugs.  Allergies  Allergen Reactions   Diazepam Hives and Nausea And Vomiting   Ziprasidone Hcl Other (See Comments)  and Itching    CONFUSED CONFUSED Other reaction(s): Other (See Comments) CONFUSED CONFUSED    Azithromycin Hives   Divalproex Sodium Hives   Levofloxacin Hives and Rash    1/31: would like to retry since not taking valium   Lisinopril     Medicine cause kidney failure   Metronidazole Hives    1/31: would like to retry since she is no longer taking valium   Sulfa Antibiotics Hives   Valproic Acid Itching and Rash   Cephalexin Hives   Ciprofloxacin Hives   Doxycycline Rash   Penicillins Hives    Has patient had a PCN reaction causing immediate rash, facial/tongue/throat swelling, SOB or lightheadedness with hypotension: No Has patient had a PCN reaction causing severe rash involving mucus membranes or skin necrosis: No Has patient had a PCN reaction that required  hospitalization: No Has patient had a PCN reaction occurring within the last 10 years: No If all of the above answers are "NO", then may proceed with Cephalosporin use.     Family History  Problem Relation Age of Onset   Diabetes Mellitus II Mother    CAD Mother    Sleep apnea Mother    Osteoarthritis Mother    Osteoporosis Mother    Anxiety disorder Mother    Depression Mother    Bipolar disorder Mother    Bipolar disorder Father    Hypertension Father    Depression Father    Anxiety disorder Father    Post-traumatic stress disorder Sister      Prior to Admission medications   Medication Sig Start Date End Date Taking? Authorizing Provider  Albuterol Sulfate (PROAIR RESPICLICK) 063 (90 Base) MCG/ACT AEPB Inhale 2 puffs into the lungs every 6 (six) hours as needed. 10/26/19   [provider]  aspirin EC 81 MG tablet Take 81 mg by mouth daily. Swallow whole.    [provider]  atorvastatin (LIPITOR) 40 MG tablet Take 40 mg by mouth daily. 04/05/20   [provider]  benztropine (COGENTIN) 0.5 MG tablet Take 1 tablet (0.5 mg total) by mouth 2 (two) times daily as needed for  tremors. Patient not taking: Reported on 09/20/2020 01/18/20   Ursula Alert, MD  busPIRone (BUSPAR) 15 MG tablet Take 1 tablet (15 mg total) by mouth 2 (two) times daily. Patient not taking: Reported on 09/23/2020 12/28/19   Ursula Alert, MD  CAPLYTA 42 MG capsule Take 42 mg by mouth at bedtime. 09/20/20   [provider]  cholecalciferol (VITAMIN D3) 25 MCG (1000 UNIT) tablet Take 1,000 Units by mouth daily.    [provider]  clonazepam (KLONOPIN) 0.125 MG disintegrating tablet Take 0.125 mg by mouth 3 (three) times daily. 08/25/20   [provider]  COVID-19 mRNA Vac-TriS, Pfizer, (PFIZER-BIONT COVID-19 VAC-TRIS) SUSP injection Inject into the muscle. 09/16/20   Carlyle Basques, MD  cyanocobalamin 1000 MCG tablet Take 1,000 mcg by mouth daily.    [provider]  desvenlafaxine (PRISTIQ) 100 MG 24 hr tablet TAKE 1 TABLET BY MOUTH ONCE A DAY Patient not taking: Reported on 09/20/2020 04/08/20   Ursula Alert, MD  dicyclomine (BENTYL) 10 MG capsule Take 10 mg by mouth 3 (three) times daily with meals. 10/16/19   [provider]  Dulaglutide (TRULICITY) 1.5 KZ/6.0FU SOPN once a week.    [provider]  esomeprazole (NEXIUM) 40 MG capsule Take by mouth. 12/23/19 03/29/20  [provider]  famotidine (PEPCID) 20 MG tablet Take 20 mg by mouth at bedtime. PT TAKES 2 TABLETS AT NIGHT    [provider]  furosemide (LASIX) 80 MG tablet Take 80 mg by mouth 2 (two) times daily. 09/08/20   [provider]  gabapentin (NEURONTIN) 300 MG capsule Take 300-600 mg by mouth See admin instructions. Take 300 mg in the morning and noon and 600 mg at bedtime 08/25/20   [provider]  ibuprofen (ADVIL) 800 MG tablet Take 800 mg by mouth 2 (two) times daily. 09/08/20   [provider]  lactulose (CHRONULAC) 10 GM/15ML solution Take 30 mLs (20 g total) by mouth daily as needed for mild constipation. Patient not taking: Reported  on 09/20/2020 04/28/20   Paulette Blanch, MD  levocetirizine (XYZAL) 5 MG tablet Take 5 mg by mouth at bedtime. 01/08/20   [provider]  levothyroxine (SYNTHROID)  112 MCG tablet Take 224 mcg by mouth daily before breakfast. 11/10/19 11/09/20  [provider]  LORazepam (ATIVAN) 0.5 MG tablet Take 1 tablet (0.5 mg total) by mouth as directed. Take 1 tablet 3-4 times a week only for severe panic attacks Patient not taking: Reported on 09/20/2020 01/01/20   Ursula Alert, MD  metoprolol succinate (TOPROL-XL) 50 MG 24 hr tablet Take 50 mg by mouth 2 (two) times daily. 04/29/20 04/29/21  [provider]  metoprolol tartrate (LOPRESSOR) 100 MG tablet Take 1 tablet (122m) by mouth 2 hours prior to cardiac CT. Patient not taking: Reported on 09/20/2020 05/03/20   [place], DKarma GreaserD, MD  montelukast (SINGULAIR) 10 MG tablet Take 10 mg by mouth at bedtime.    [provider]  Multiple Vitamin (MULTIVITAMIN) tablet Take 1 tablet by mouth daily.    [provider]  nitroGLYCERIN (NITROSTAT) 0.4 MG SL tablet Place 0.4 mg under the tongue every 5 (five) minutes as needed for chest pain. 04/21/20   [provider]  omeprazole (PRILOSEC) 40 MG capsule Take 40 mg by mouth 2 (two) times daily.    [provider]  oxyCODONE-acetaminophen (PERCOCET/ROXICET) 5-325 MG tablet Take 1 tablet by mouth every 6 (six) hours as needed for severe pain. Patient not taking: Reported on 09/20/2020 05/07/20   QDelman Kitten MD  potassium chloride SA (KLOR-CON) 20 MEQ tablet Take 40 mEq by mouth 2 (two) times daily. 12/25/19   [provider]  pregabalin (LYRICA) 150 MG capsule Take 1 capsule (150 mg total) by mouth 3 (three) times daily. Do not take gabapentin (Neurontin) while taking this medication. Patient not taking: Reported on 09/20/2020 03/04/20 06/02/20  NMilinda Pointer MD  promethazine (PHENERGAN) 25 MG tablet Take 12.5 mg by mouth every 6 (six) hours as needed for  nausea or vomiting (reflux). 03/26/20   [provider]  QUEtiapine (SEROQUEL) 100 MG tablet Take 100 mg by mouth at bedtime. Patient not taking: Reported on 09/23/2020 11/25/19   [provider]  QUEtiapine (SEROQUEL) 25 MG tablet Take 25 mg by mouth See admin instructions. Take in morning and lunch Patient not taking: Reported on 09/23/2020    [provider]  ramelteon (ROZEREM) 8 MG tablet Take 8 mg by mouth at bedtime. 09/17/20   [provider]  rizatriptan (MAXALT) 10 MG tablet Take 10 mg by mouth daily as needed for migraine. 09/08/20   [provider]  rOPINIRole (REQUIP) 1 MG tablet Take 1 mg by mouth 3 (three) times daily. 12/10/19   [provider]  traMADol (ULTRAM) 50 MG tablet Take 50 mg by mouth every 6 (six) hours as needed. 08/19/20   [provider]  traZODone (DESYREL) 50 MG tablet Take 50-150 mg by mouth at bedtime as needed for sleep. 09/27/20   [provider]  vitamin C (ASCORBIC ACID) 500 MG tablet Take 500 mg by mouth daily.    [provider]  vitamin E 180 MG (400 UNITS) capsule Take 400 Units by mouth daily.    [provider]  zolpidem (AMBIEN) 10 MG tablet Take 10 mg by mouth at bedtime as needed for sleep. 09/29/20   [provider]    Physical Exam: Vitals:   09/30/20 1608 09/30/20 1730 09/30/20 1800  BP: 1_0  Pulse: (!) 115 (!) 106 (!) 101  Resp: _1 SpO2: 100% 100% 97%   Constitutional: Obese woman sitting up in bed, NAD, calm, comfortable Eyes: PERRL, lids  and conjunctivae normal ENMT: Mucous membranes are moist. Posterior pharynx clear of any exudate or lesions.Normal dentition.  Neck: normal, supple, no masses. Respiratory: clear to auscultation bilaterally, no wheezing, no crackles. Normal respiratory effort. No accessory muscle use.  Cardiovascular: Tachycardic with regular rhythm, no murmurs / rubs / gallops.  Trace bilateral lower  extremity edema. 2+ pedal pulses. Abdomen: Well-healed midline surgical scar.  No tenderness, no masses palpated. No hepatosplenomegaly. Musculoskeletal: no clubbing / cyanosis. No joint deformity upper and lower extremities. Good ROM, no contractures. Normal muscle tone.  Skin: no rashes, lesions, ulcers. No induration Neurologic: CN 2-12 grossly intact. Sensation intact. Strength 5/5 in all 4.  Psychiatric: Normal judgment and insight. Alert and oriented x 3. Normal mood.   Labs on Admission: I have personally reviewed following labs and imaging studies  CBC: Recent Labs  Lab 09/30/20 1716  WBC 8.7  NEUTROABS 6.1  HGB 11.5*  HCT 34.7*  MCV 88.5  PLT 063   Basic Metabolic Panel: Recent Labs  Lab 09/30/20 1716  NA 135  K 3.0*  CL 97*  CO2 29  GLUCOSE 97  BUN 14  CREATININE 0.89  CALCIUM 8.6*   GFR: Estimated Creatinine Clearance: 89.6 mL/min (by C-G formula based on SCr of 0.89 mg/dL). Liver Function Tests: Recent Labs  Lab 09/30/20 1716  AST 17  ALT 17  ALKPHOS 127*  BILITOT 0.6  PROT 7.8  ALBUMIN 3.8   No results for input(s): LIPASE, AMYLASE in the last 168 hours. No results for input(s): AMMONIA in the last 168 hours. Coagulation Profile: Recent Labs  Lab 09/30/20 1716  INR 1.1   Cardiac Enzymes: No results for input(s): CKTOTAL, CKMB, CKMBINDEX, TROPONINI in the last 168 hours. BNP (last 3 results) No results for input(s): PROBNP in the last 8760 hours. HbA1C: No results for input(s): HGBA1C in the last 72 hours. CBG: No results for input(s): GLUCAP in the last 168 hours. Lipid Profile: No results for input(s): CHOL, HDL, LDLCALC, TRIG, CHOLHDL, LDLDIRECT in the last 72 hours. Thyroid Function Tests: No results for input(s): TSH, T4TOTAL, FREET4, T3FREE, THYROIDAB in the last 72 hours. Anemia Panel: No results for input(s): VITAMINB12, FOLATE, FERRITIN, TIBC, IRON, RETICCTPCT in the last 72 hours. Urine analysis:    Component Value Date/Time    COLORURINE YELLOW (A) 04/27/2020 2349   APPEARANCEUR CLEAR (A) 04/27/2020 2349   LABSPEC >1.046 (H) 04/27/2020 2349   PHURINE 5.0 04/27/2020 [place] 04/27/2020 2349   HGBUR NEGATIVE 04/27/2020 2349   BILIRUBINUR NEGATIVE 04/27/2020 2349   KETONESUR NEGATIVE 04/27/2020 2349   PROTEINUR NEGATIVE 04/27/2020 2349   NITRITE NEGATIVE 04/27/2020 2349   LEUKOCYTESUR NEGATIVE 04/27/2020 2349    Radiological Exams on Admission: CT Angio Chest/Abd/Pel for Dissection W and/or W/WO  Result Date: 09/30/2020 CLINICAL DATA:  Abdominal pain. Code stroke, had ABD surgery in April when pt started to have a R sided facial droop and slurred speech. EXAM: CT ANGIOGRAPHY CHEST, ABDOMEN AND PELVIS TECHNIQUE: Non-contrast CT of the chest was initially obtained. Multidetector CT imaging through the chest, abdomen and pelvis was performed using the standard protocol during bolus administration of intravenous contrast. Multiplanar reconstructed images and MIPs were obtained and reviewed to evaluate the vascular anatomy. CONTRAST:  154m OMNIPAQUE IOHEXOL 350 MG/ML SOLN COMPARISON:  CT abdomen pelvis 04/27/2020, CT abdomen pelvis 12/02/2019, CT abdomen pelvis 02/07/2019, CT abdomen pelvis 08/01/2017, CT abdomen pelvis 04/23/2015 FINDINGS: CTA CHEST FINDINGS Cardiovascular: Preferential opacification of the thoracic aorta. No evidence of  thoracic aortic aneurysm or dissection. No atherosclerotic plaque of the thoracic aorta. No coronary artery calcifications.Normal heart size. No significant pericardial effusion. The main pulmonary artery is normal in caliber. No central pulmonary embolus. Mediastinum/Nodes: No enlarged mediastinal, hilar, or axillary lymph nodes. Thyroid gland, trachea, and esophagus demonstrate no significant findings. Lungs/Pleura: Similar-appearing foci of linear atelectasis versus scarring within the lingula with a slightly more rounded nodular-like region measuring 7 mm. No focal  consolidation. No pulmonary nodule. No pulmonary mass. No pleural effusion. No pneumothorax. Musculoskeletal: No chest wall abnormality. No suspicious lytic or blastic osseous lesions. No acute displaced fracture. Multilevel degenerative changes of the spine. Review of the MIP images confirms the above findings. CTA ABDOMEN AND PELVIS FINDINGS VASCULAR Aorta: Normal caliber aorta without aneurysm, dissection, vasculitis or significant stenosis. Celiac: Patent without evidence of aneurysm, dissection, vasculitis or significant stenosis. SMA: Patent without evidence of aneurysm, dissection, vasculitis or significant stenosis. Renals: Both renal arteries are patent without evidence of aneurysm, dissection, vasculitis, fibromuscular dysplasia or significant stenosis. IMA: Patent without evidence of aneurysm, dissection, vasculitis or significant stenosis. Inflow: Patent without evidence of aneurysm, dissection, vasculitis or significant stenosis. Veins: No obvious venous abnormality within the limitations of this arterial phase study. Review of the MIP images confirms the above findings. NON-VASCULAR Hepatobiliary: No focal liver abnormality. No gallstones, gallbladder wall thickening, or pericholecystic fluid. No biliary dilatation. Pancreas: No focal lesion. Normal pancreatic contour. No surrounding inflammatory changes. No main pancreatic ductal dilatation. Spleen: Normal in size without focal abnormality. Adrenals/Urinary Tract: Slight interval increase in size of a 2 cm (from 1.7 cm) left adrenal gland nodule with a density of 85 Hounsfield units. No right adrenal gland nodule. Bilateral kidneys enhance symmetrically. Bilateral renal excretion of previously administered intravenous contrast is symmetrical (recent CT angiography of the head and neck). No hydronephrosis. No hydroureter. The urinary bladder is unremarkable. There is no urothelial wall thickening and there are no filling defects in the opacified  portions of the bilateral collecting systems or ureters nor of the urinary bladder. Stomach/Bowel: Query sigmoid surgical changes. Stomach is within normal limits. No evidence of bowel wall thickening or dilatation. The appendix is not definitely identified. Lymphatic: No lymphadenopathy. Reproductive: Status post hysterectomy. No adnexal masses. Other: No intraperitoneal free fluid. No intraperitoneal free gas. No organized fluid collection. Musculoskeletal: Small fat containing supraumbilical ventral wall hernia (9:101) with an abdominal defect of 1.1 cm. And a supraumbilical ventral wall hernia containing a short loop of small bowel (9:99) with an abdominal defect of 2.5 cm. Neural stimulator within the subcutaneus soft tissues of back with leads entering the central canal at the L1-L2 level and terminating along the posterior central canal at the T7 through T10 levels. No suspicious lytic or blastic osseous lesions. No acute displaced fracture. Multilevel degenerative changes of the spine. Left hip degenerative changes. Review of the MIP images confirms the above findings. IMPRESSION: 1. No acute vascular abnormality. 2. Lingular atelectasis versus scarring with an associated 7 mm nodule. Initial follow-up with CT at 6-12 months is recommended to confirm persistence. If persistent, repeat CT is recommended every 2 years until 5 years of stability has been established. This recommendation follows the consensus statement: Guidelines for Management of Incidental Pulmonary Nodules Detected on CT Images: From the Fleischner Society 2017; Radiology 2017; 284:228-243. 3. Two supraumbilical ventral wall hernias: One containing fat and other containing a short loop of small bowel with no findings of associated ischemia or bowel obstruction. 4. Slight interval increase in size of  an indeterminate 2 cm left adrenal gland nodule. Recommend CT adrenal gland protocol further evaluation. Electronically Signed   By: Iven Finn M.D.   On: 09/30/2020 17:27   CT HEAD CODE STROKE WO CONTRAST  Result Date: 09/30/2020 CLINICAL DATA:  Code stroke. EXAM: CT HEAD WITHOUT CONTRAST TECHNIQUE: Contiguous axial images were obtained from the base of the skull through the vertex without intravenous contrast. COMPARISON:  09/29/2018. FINDINGS: Brain: Mild hypodensity in the right temporal lobe (series 4, image 9), which is favored to be artifactual. No definite acute infarct, hemorrhage, hydrocephalus, extra-axial collection, mass, or mass effect. Vascular: No hyperdense vessel or unexpected calcification. Skull: Normal. Negative for fracture or focal lesion. Sinuses/Orbits: No acute finding. Other: None ASPECTS ([place] Stroke Program Early CT Score) - Ganglionic level infarction (caudate, lentiform nuclei, internal capsule, insula, M1-M3 cortex): 7 - Supraganglionic infarction (M4-M6 cortex): 3 Total score (0-10 with 10 being normal): 10 IMPRESSION: 1. Mild hypodensity in the right temporal lobe, which is favored to be artifactual. No definite acute infarct. No hemorrhage. 2. ASPECTS is 10 Code stroke imaging results were communicated on 09/30/2020 at 4:26 pm to provider Dr. Vladimir Crofts via telephone, who verbally acknowledged these results. Electronically Signed   By: Merilyn Baba M.D.   On: 09/30/2020 16:29   CT ANGIO HEAD NECK W WO CM W PERF (CODE STROKE)  Result Date: 09/30/2020 CLINICAL DATA:  Right-sided facial droop and slurred speech EXAM: CT ANGIOGRAPHY HEAD AND NECK CT PERFUSION BRAIN TECHNIQUE: Multidetector CT imaging of the head and neck was performed using the standard protocol during bolus administration of intravenous contrast. Multiplanar CT image reconstructions and MIPs were obtained to evaluate the vascular anatomy. Carotid stenosis measurements (when applicable) are obtained utilizing NASCET criteria, using the distal internal carotid diameter as the denominator. Multiphase CT imaging of the brain was performed  following IV bolus contrast injection. Subsequent parametric perfusion maps were calculated using RAPID software. CONTRAST:  168m OMNIPAQUE IOHEXOL 350 MG/ML SOLN COMPARISON:  None. FINDINGS: Evaluation limited by poor contrast bolus timing. CTA NECK Aortic arch: Arch and great vessel origins are unremarkable. Right carotid system: Patent.  No stenosis. Left carotid system: Patent.  No stenosis. Vertebral arteries: Patent. Proximal right vertebral artery is poorly visualized due to artifact. No definite stenosis. Skeleton: Degenerative changes at C5-C6. Other neck: Unremarkable. Upper chest: Included upper lungs are clear. Review of the MIP images confirms the above findings CTA HEAD Anterior circulation: Intracranial internal carotid arteries are patent. Proximal anterior and middle cerebral arteries appear patent at the circle-of-Willis. No definite M2 or A2 occlusion. Posterior circulation: Intracranial vertebral arteries, basilar artery, and proximal posterior cerebral arteries are patent. Venous sinuses: Patent as allowed by contrast bolus timing. Review of the MIP images confirms the above findings CT Brain Perfusion Findings: Degraded by motion. CBF (<30%) Volume: 019mPerfusion (Tmax>6.0s) volume: 1255mismatch Volume: 24m59mfarction Location: Calculated penumbra within both inferior frontal lobes. IMPRESSION: Suboptimal evaluation due to contrast bolus timing. No large vessel occlusion. No hemodynamically significant stenosis in the neck. Perfusion imaging demonstrates a calculated penumbra within both inferior frontal lobes of 12 mL. This is favored to be artifactual. These results were called by telephone at the time of interpretation on 09/30/2020 at 4:49 pm to [facility] verb[facility] these results. Electronically Signed   By: PranMacy Mis.   On: 09/30/2020 16:56    EKG: Personally reviewed. Sinus tachycardia, rate 109, nonspecific IVCD, motion artifact.  Rate is faster  when compared to prior.  Assessment/Plan Principal Problem:   Seizure-like activity ([place]) Active Problems:   Essential (primary) hypertension   Adult hypothyroidism   Apnea, sleep   Type 2 diabetes mellitus ([place])   Incidental lung nodule   Ventral hernia without obstruction or gangrene   Chronic pain syndrome   Presence of neurostimulator (SCS) (June 2021)   Hypokalemia   Adrenal nodule (HCC)   Hyperlipidemia   COPD (chronic obstructive pulmonary disease) (HCC)   Chronic diastolic CHF (congestive heart failure) (HCC)   Marie Clarke is a 50 y.o. female with medical history significant for CAD, chronic diastolic CHF (EF 00-34%), T2DM, HTN, HLD, hypothyroidism, COPD, bipolar disorder/depression/anxiety, chronic pain syndrome s/p thoracic spinal cord stimulator placement, OSA on CPAP, and incarcerated ventral hernia s/p repair 05/30/2020 who is admitted for seizure work-up.  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  Had witnessed seizure activity by neurology followed by postictal state with Todd's paralysis.  Loaded with IV Keppra and now back to baseline. -Neurology following, appreciate assistance -Continue IV Keppra 500 mg twice daily -EEG completed, read pending -MRI brain without contrast (discussed with radiology, spinal cord stimulator is compatible) -Seizure precautions  S/p repair of incarcerated ventral hernia 05/30/2020: CT shows 2 supraumbilical ventral wall hernias without findings of associated strangulation or incarceration.  Patient's abdominal pain, nausea, vomiting have resolved.  Chronic diastolic CHF: EF 91-79%.  Mild lower extremity edema but otherwise compensated.  Resume home Lasix tomorrow with potassium supplementation.  Type 2 diabetes: A1c 6.3% on 08/24/2020.  Hold home Trulicity and place on sensitive SSI.  Hypertension: Continue Toprol-XL and  Lasix.  Hypothyroidism: Continue Synthroid.  COPD: Stable, continue Singulair and albuterol as needed.  CAD: Denies any chest pain.  Continue atorvastatin. Aspirin on hold pending planned spinal cord stimulator removal on 10/03/20.  Bipolar disorder/depression/anxiety: Continue home Klonopin 0.125 mg TID.  Holding Caplyta for now as it may lower seizure threshold.  Hypokalemia: Continue supplementation.  Hyperlipidemia: Continue atorvastatin.  Chronic pain syndrome: S/p thoracic spinal cord stimulator placement, records show plans for removal on 10/03/2020.  Continue gabapentin.  OSA: Continue CPAP nightly.  Lung nodule: 7 mm lingular lung nodule noted on CT imaging.  Follow-up CT at 6 712 months recommended.  Left adrenal gland nodule: CT imaging noted slight interval increase in size of an indeterminate 2 cm left adrenal nodule.  Follow-up CT adrenal gland protocol recommended for further evaluation.   DVT prophylaxis: Lovenox Code Status: DNI, confirmed with patient on admission Family Communication: Discussed with patient, she has discussed with family Disposition Plan: From home and likely discharge to home pending clinical progress Consults called: Neurology Level of care: Med-Surg Admission status:  Status is: Observation  The patient remains OBS appropriate and will d/c before 2 midnights.  Dispo: The patient is from: Home              Anticipated d/c is to: Home              Patient currently is not medically stable to d/c.   Difficult to place patient No   Zada Finders MD [facility]  If 7PM-7AM, please contact night-coverage [email or web address]  09/30/2020, 6:31 PM

## 2020-09-30 NOTE — Progress Notes (Signed)
Chaplain Maggie responded to Code Stroke page to ED 4. Pt was at Ct. Chaplain available for support per on call chaplain at [phone].

## 2020-09-30 NOTE — Progress Notes (Signed)
Neurology Brief Note  50 yo woman with hx strangulated hernia s/p surgery at [facility] in April who is BIB EMS as code stroke for R sided jerking f/b R sided weakness. EMS was initially called because patient reported nausea, vomiting, and abdominal pain.  There was concern that she had recurrent strangulated hernia so they were in route to [facility] where she received her surgery before.  While in route she developed rhythmic jerking of her right upper extremity followed by weakness of her right face arm and leg.  This was resolving when she reached the hospital although she still had a right-sided facial droop and some right upper extremity weakness.  She was alert and interactive.  She was taken to CT where she had a right focal seizure witnessed by me involving jerking that was rhythmic of the right upper extremity followed by tonic flexion of the right upper extremity.  During that time she was aphasic and then afterwards she was not making sense when she was talking.  I asked her to remember the words purple umbrella and she was unable to after the event.  CT head showed no acute intracranial process.  CTA head and neck had a poor bolus but did not show any definitive LVO.  Due to concern that she might also have an abdominal process that may preclude tPA a CTA chest abdomen and pelvis was performed to rule out any vascular or GI pathology that may require immediate intervention and preclude tPA.  The ED physician and GI reviewed the images and did not see anything concerning.  However by this point patient's symptoms were rapidly resolving and therefore tPA was not administered due to to the fact that her symptoms were felt to be secondary to postictal state with Todd's paralysis and not stroke.  Upon further questioning patient states that she has had 4 total episodes of this in the past that were previously diagnosed as TIA but based on today's events are actually consistent with  seizure.  Recommendations: - Admit to hospitalist service for seizure workup - rEEG orderred and will be performed in ED - After EEG completed please order MRI brain if patient's spinal cord stimulator is compatible - S/p keppra 20 mg/kg load in ED - Continue keppra '500mg'$  bid - Seizure precautions  Full consult note to follow.  Su Monks, MD [facility] [phone]  If 7pm- 7am, please page neurology on call as listed in [place].

## 2020-09-30 NOTE — Progress Notes (Signed)
Pt yellow mews from time she was admitted to the unit. Pt has been having elevated HR and seizure activity. MD aware of seizure activity. IV Keppra dose given. Pt has also received multiple doses of Ativan. Pt now resting comfortably in bed.      09/30/20 2242  Assess: MEWS Score  Temp 97.7 F (36.5 C)  BP 98/75  Pulse Rate (!) 108  Resp 20  SpO2 97 %  O2 Device Room Air  Assess: MEWS Score  MEWS Temp 0  MEWS Systolic 1  MEWS Pulse 1  MEWS RR 0  MEWS LOC 0  MEWS Score 2  MEWS Score Color Yellow  Assess: if the MEWS score is Yellow or Red  Were vital signs taken at a resting state? Yes  Focused Assessment No change from prior assessment  Does the patient meet 2 or more of the SIRS criteria? No  MEWS guidelines implemented *See Row Information* No, previously yellow, continue vital signs every 4 hours  Notify: Charge Nurse/RN  Name of Charge Nurse/RN Notified Freja, RN  Date Charge Nurse/RN Notified 09/30/20  Time Charge Nurse/RN Notified 2200  Assess: SIRS CRITERIA  SIRS Temperature  0  SIRS Pulse 1  SIRS Respirations  0  SIRS WBC 0  SIRS Score Sum  1

## 2020-09-30 NOTE — Progress Notes (Signed)
PHARMACIST - PHYSICIAN COMMUNICATION  CONCERNING:  Enoxaparin (Lovenox) for DVT Prophylaxis    RECOMMENDATION: Patient was prescribed enoxaprin '40mg'$  q24 hours for VTE prophylaxis.   There were no vitals filed for this visit.  There is no height or weight on file to calculate BMI.  Estimated Creatinine Clearance: 89.6 mL/min (by C-G formula based on SCr of 0.89 mg/dL).   Based on [place] patient is candidate for enoxaparin 0.'5mg'$ /kg TBW SQ every 24 hours based on BMI being >30.  DESCRIPTION: Pharmacy has adjusted enoxaparin dose per [facility] policy.  Patient is now receiving enoxaparin 50 mg every 24 hours    Berta Minor, PharmD Clinical Pharmacist  09/30/2020 7:07 PM

## 2020-10-01 ENCOUNTER — Observation Stay: Payer: Medicare Other

## 2020-10-01 ENCOUNTER — Inpatient Hospital Stay (HOSPITAL_COMMUNITY)
Admission: AD | Admit: 2020-10-01 | Discharge: 2020-10-04 | DRG: 101 | Disposition: A | Discharge status: Home / Self Care | Payer: Medicare Other | Source: Other Acute Inpatient Hospital | Attending: Internal Medicine | Admitting: Internal Medicine

## 2020-10-01 DIAGNOSIS — N183 Chronic kidney disease, stage 3 unspecified: Secondary | ICD-10-CM | POA: Diagnosis present

## 2020-10-01 DIAGNOSIS — Z880 Allergy status to penicillin: Secondary | ICD-10-CM

## 2020-10-01 DIAGNOSIS — G894 Chronic pain syndrome: Secondary | ICD-10-CM

## 2020-10-01 DIAGNOSIS — Z8262 Family history of osteoporosis: Secondary | ICD-10-CM

## 2020-10-01 DIAGNOSIS — M5136 Other intervertebral disc degeneration, lumbar region: Secondary | ICD-10-CM | POA: Diagnosis present

## 2020-10-01 DIAGNOSIS — F319 Bipolar disorder, unspecified: Secondary | ICD-10-CM | POA: Diagnosis present

## 2020-10-01 DIAGNOSIS — G4733 Obstructive sleep apnea (adult) (pediatric): Secondary | ICD-10-CM | POA: Diagnosis present

## 2020-10-01 DIAGNOSIS — J439 Emphysema, unspecified: Secondary | ICD-10-CM | POA: Diagnosis present

## 2020-10-01 DIAGNOSIS — I5032 Chronic diastolic (congestive) heart failure: Secondary | ICD-10-CM

## 2020-10-01 DIAGNOSIS — E1169 Type 2 diabetes mellitus with other specified complication: Secondary | ICD-10-CM | POA: Diagnosis present

## 2020-10-01 DIAGNOSIS — R4701 Aphasia: Secondary | ICD-10-CM | POA: Diagnosis present

## 2020-10-01 DIAGNOSIS — K439 Ventral hernia without obstruction or gangrene: Secondary | ICD-10-CM | POA: Diagnosis present

## 2020-10-01 DIAGNOSIS — I130 Hypertensive heart and chronic kidney disease with heart failure and stage 1 through stage 4 chronic kidney disease, or unspecified chronic kidney disease: Secondary | ICD-10-CM | POA: Diagnosis present

## 2020-10-01 DIAGNOSIS — E785 Hyperlipidemia, unspecified: Secondary | ICD-10-CM | POA: Diagnosis present

## 2020-10-01 DIAGNOSIS — Z79899 Other long term (current) drug therapy: Secondary | ICD-10-CM

## 2020-10-01 DIAGNOSIS — M797 Fibromyalgia: Secondary | ICD-10-CM | POA: Diagnosis present

## 2020-10-01 DIAGNOSIS — Z6838 Body mass index (BMI) 38.0-38.9, adult: Secondary | ICD-10-CM | POA: Diagnosis not present

## 2020-10-01 DIAGNOSIS — R531 Weakness: Secondary | ICD-10-CM | POA: Diagnosis present

## 2020-10-01 DIAGNOSIS — I252 Old myocardial infarction: Secondary | ICD-10-CM

## 2020-10-01 DIAGNOSIS — Z881 Allergy status to other antibiotic agents status: Secondary | ICD-10-CM

## 2020-10-01 DIAGNOSIS — R569 Unspecified convulsions: Principal | ICD-10-CM

## 2020-10-01 DIAGNOSIS — Z8249 Family history of ischemic heart disease and other diseases of the circulatory system: Secondary | ICD-10-CM

## 2020-10-01 DIAGNOSIS — G8384 Todd's paralysis (postepileptic): Secondary | ICD-10-CM | POA: Diagnosis present

## 2020-10-01 DIAGNOSIS — G4730 Sleep apnea, unspecified: Secondary | ICD-10-CM

## 2020-10-01 DIAGNOSIS — E876 Hypokalemia: Secondary | ICD-10-CM | POA: Diagnosis present

## 2020-10-01 DIAGNOSIS — I10 Essential (primary) hypertension: Secondary | ICD-10-CM

## 2020-10-01 DIAGNOSIS — Z818 Family history of other mental and behavioral disorders: Secondary | ICD-10-CM

## 2020-10-01 DIAGNOSIS — F410 Panic disorder [episodic paroxysmal anxiety] without agoraphobia: Secondary | ICD-10-CM | POA: Diagnosis present

## 2020-10-01 DIAGNOSIS — Z87891 Personal history of nicotine dependence: Secondary | ICD-10-CM

## 2020-10-01 DIAGNOSIS — Z882 Allergy status to sulfonamides status: Secondary | ICD-10-CM

## 2020-10-01 DIAGNOSIS — E278 Other specified disorders of adrenal gland: Secondary | ICD-10-CM | POA: Diagnosis not present

## 2020-10-01 DIAGNOSIS — Z85820 Personal history of malignant melanoma of skin: Secondary | ICD-10-CM

## 2020-10-01 DIAGNOSIS — G4700 Insomnia, unspecified: Secondary | ICD-10-CM | POA: Diagnosis not present

## 2020-10-01 DIAGNOSIS — E1122 Type 2 diabetes mellitus with diabetic chronic kidney disease: Secondary | ICD-10-CM | POA: Diagnosis present

## 2020-10-01 DIAGNOSIS — Z20822 Contact with and (suspected) exposure to covid-19: Secondary | ICD-10-CM | POA: Diagnosis present

## 2020-10-01 DIAGNOSIS — F79 Unspecified intellectual disabilities: Secondary | ICD-10-CM | POA: Diagnosis present

## 2020-10-01 DIAGNOSIS — F5102 Adjustment insomnia: Secondary | ICD-10-CM

## 2020-10-01 DIAGNOSIS — K219 Gastro-esophageal reflux disease without esophagitis: Secondary | ICD-10-CM | POA: Diagnosis present

## 2020-10-01 DIAGNOSIS — Z8659 Personal history of other mental and behavioral disorders: Secondary | ICD-10-CM

## 2020-10-01 DIAGNOSIS — M8580 Other specified disorders of bone density and structure, unspecified site: Secondary | ICD-10-CM | POA: Diagnosis present

## 2020-10-01 DIAGNOSIS — R911 Solitary pulmonary nodule: Secondary | ICD-10-CM | POA: Diagnosis present

## 2020-10-01 DIAGNOSIS — R29810 Facial weakness: Secondary | ICD-10-CM | POA: Diagnosis present

## 2020-10-01 DIAGNOSIS — R130 Aphagia: Secondary | ICD-10-CM | POA: Diagnosis present

## 2020-10-01 DIAGNOSIS — F4310 Post-traumatic stress disorder, unspecified: Secondary | ICD-10-CM | POA: Diagnosis present

## 2020-10-01 DIAGNOSIS — I2510 Atherosclerotic heart disease of native coronary artery without angina pectoris: Secondary | ICD-10-CM | POA: Diagnosis present

## 2020-10-01 DIAGNOSIS — Z79890 Hormone replacement therapy (postmenopausal): Secondary | ICD-10-CM

## 2020-10-01 DIAGNOSIS — M1990 Unspecified osteoarthritis, unspecified site: Secondary | ICD-10-CM | POA: Diagnosis present

## 2020-10-01 DIAGNOSIS — M810 Age-related osteoporosis without current pathological fracture: Secondary | ICD-10-CM | POA: Diagnosis present

## 2020-10-01 DIAGNOSIS — Z833 Family history of diabetes mellitus: Secondary | ICD-10-CM

## 2020-10-01 DIAGNOSIS — Z9151 Personal history of suicidal behavior: Secondary | ICD-10-CM

## 2020-10-01 DIAGNOSIS — Z9682 Presence of neurostimulator: Secondary | ICD-10-CM | POA: Diagnosis not present

## 2020-10-01 DIAGNOSIS — G2581 Restless legs syndrome: Secondary | ICD-10-CM | POA: Diagnosis present

## 2020-10-01 DIAGNOSIS — Z90710 Acquired absence of both cervix and uterus: Secondary | ICD-10-CM

## 2020-10-01 DIAGNOSIS — E039 Hypothyroidism, unspecified: Secondary | ICD-10-CM | POA: Diagnosis present

## 2020-10-01 DIAGNOSIS — R1030 Lower abdominal pain, unspecified: Secondary | ICD-10-CM | POA: Diagnosis present

## 2020-10-01 DIAGNOSIS — Z820 Family history of epilepsy and other diseases of the nervous system: Secondary | ICD-10-CM

## 2020-10-01 DIAGNOSIS — Z8673 Personal history of transient ischemic attack (TIA), and cerebral infarction without residual deficits: Secondary | ICD-10-CM

## 2020-10-01 DIAGNOSIS — Z888 Allergy status to other drugs, medicaments and biological substances status: Secondary | ICD-10-CM

## 2020-10-01 LAB — GLUCOSE, CAPILLARY
Glucose-Capillary: 145 mg/dL — ABNORMAL HIGH (ref 70–99)
Glucose-Capillary: 140 mg/dL — ABNORMAL HIGH (ref 70–99)
Glucose-Capillary: 136 mg/dL — ABNORMAL HIGH (ref 70–99)
Glucose-Capillary: 116 mg/dL — ABNORMAL HIGH (ref 70–99)

## 2020-10-01 LAB — BASIC METABOLIC PANEL
Anion gap: 12 (ref 5–15)
BUN: 15 mg/dL (ref 6–20)
CO2: 25 mmol/L (ref 22–32)
Calcium: 9.2 mg/dL (ref 8.9–10.3)
Chloride: 100 mmol/L (ref 98–111)
Creatinine, Ser: 0.6 mg/dL (ref 0.44–1.00)
GFR, Estimated: >60 mL/min (ref >60)
Glucose, Bld: 127 mg/dL — ABNORMAL HIGH (ref 70–99)
Potassium: 3.9 mmol/L (ref 3.5–5.1)
Sodium: 137 mmol/L (ref 135–145)

## 2020-10-01 LAB — MRSA NEXT GEN BY PCR, NASAL: MRSA by PCR Next Gen: NOT DETECTED

## 2020-10-01 LAB — HEMOGLOBIN A1C
Hgb A1c MFr Bld: 6.5 % — ABNORMAL HIGH (ref 4.8–5.6)
Mean Plasma Glucose: 139.85 mg/dL

## 2020-10-01 LAB — MAGNESIUM: Magnesium: 2.1 mg/dL (ref 1.7–2.4)

## 2020-10-01 MED ORDER — GABAPENTIN 300 MG PO CAPS
300.0000 mg | ORAL_CAPSULE | Freq: Two times a day (BID) | ORAL | Status: DC
  Administered 2020-10-02 – 2020-10-04 (×5): 300 mg via ORAL
  Filled 2020-10-01 (×5): qty 1

## 2020-10-01 MED ORDER — FUROSEMIDE 40 MG PO TABS
80.0000 mg | ORAL_TABLET | Freq: Two times a day (BID) | ORAL | Status: DC
  Administered 2020-10-02 – 2020-10-04 (×3): 80 mg via ORAL
  Filled 2020-10-01 (×3): qty 2

## 2020-10-01 MED ORDER — LUMATEPERONE TOSYLATE 42 MG PO CAPS: 42.0000 mg | ORAL_CAPSULE | Freq: Every day | ORAL | Status: DC

## 2020-10-01 MED ORDER — LEVOCETIRIZINE DIHYDROCHLORIDE 5 MG PO TABS: 5.0000 mg | ORAL_TABLET | Freq: Every day | ORAL | Status: DC

## 2020-10-01 MED ORDER — ALBUTEROL SULFATE (2.5 MG/3ML) 0.083% IN NEBU: 2.5000 mg | INHALATION_SOLUTION | Freq: Four times a day (QID) | RESPIRATORY_TRACT | Status: DC | PRN

## 2020-10-01 MED ORDER — INSULIN ASPART 100 UNIT/ML IJ SOLN
0.0000 [IU] | Freq: Three times a day (TID) | INTRAMUSCULAR | Status: DC
  Administered 2020-10-02 – 2020-10-03 (×3): 2 [IU] via SUBCUTANEOUS
  Administered 2020-10-03: 3 [IU] via SUBCUTANEOUS

## 2020-10-01 MED ORDER — BUTALBITAL-APAP-CAFFEINE 50-325-40 MG PO TABS
1.0000 | ORAL_TABLET | Freq: Four times a day (QID) | ORAL | Status: DC | PRN
  Administered 2020-10-01: 1 via ORAL
  Filled 2020-10-01: qty 1

## 2020-10-01 MED ORDER — LEVETIRACETAM IN NACL 500 MG/100ML IV SOLN
500.0000 mg | Freq: Two times a day (BID) | INTRAVENOUS | Status: DC
  Administered 2020-10-01: 500 mg via INTRAVENOUS
  Filled 2020-10-01: qty 100

## 2020-10-01 MED ORDER — ATORVASTATIN CALCIUM 40 MG PO TABS
40.0000 mg | ORAL_TABLET | Freq: Every day | ORAL | Status: DC
  Administered 2020-10-01 – 2020-10-03 (×3): 40 mg via ORAL
  Filled 2020-10-01 (×2): qty 1

## 2020-10-01 MED ORDER — LEVOTHYROXINE SODIUM 112 MCG PO TABS
224.0000 ug | ORAL_TABLET | Freq: Every day | ORAL | Status: DC
  Administered 2020-10-02 – 2020-10-04 (×3): 224 ug via ORAL
  Filled 2020-10-01 (×3): qty 2

## 2020-10-01 MED ORDER — ACETAMINOPHEN 325 MG PO TABS
650.0000 mg | ORAL_TABLET | Freq: Four times a day (QID) | ORAL | Status: DC | PRN
  Administered 2020-10-02: 650 mg via ORAL
  Filled 2020-10-01: qty 2

## 2020-10-01 MED ORDER — ONDANSETRON HCL 4 MG PO TABS: 4.0000 mg | ORAL_TABLET | Freq: Four times a day (QID) | ORAL | Status: DC | PRN

## 2020-10-01 MED ORDER — GADOBUTROL 1 MMOL/ML IV SOLN
10.0000 mL | Freq: Once | INTRAVENOUS | Status: AC | PRN
  Administered 2020-10-01: 02:00:00 10 mL via INTRAVENOUS

## 2020-10-01 MED ORDER — ACETAMINOPHEN 650 MG RE SUPP: 650.0000 mg | Freq: Four times a day (QID) | RECTAL | Status: DC | PRN

## 2020-10-01 MED ORDER — LORATADINE 10 MG PO TABS
10.0000 mg | ORAL_TABLET | Freq: Every day | ORAL | Status: DC
  Administered 2020-10-01 – 2020-10-03 (×3): 10 mg via ORAL
  Filled 2020-10-01 (×3): qty 1

## 2020-10-01 MED ORDER — ONDANSETRON HCL 4 MG/2ML IJ SOLN
4.0000 mg | Freq: Four times a day (QID) | INTRAMUSCULAR | Status: DC | PRN
  Administered 2020-10-02 – 2020-10-03 (×4): 4 mg via INTRAVENOUS
  Filled 2020-10-01 (×4): qty 2

## 2020-10-01 MED ORDER — MONTELUKAST SODIUM 10 MG PO TABS
10.0000 mg | ORAL_TABLET | Freq: Every day | ORAL | Status: DC
  Administered 2020-10-01 – 2020-10-03 (×3): 10 mg via ORAL
  Filled 2020-10-01 (×3): qty 1

## 2020-10-01 MED ORDER — MAGNESIUM SULFATE 2 GM/50ML IV SOLN
2.0000 g | Freq: Once | INTRAVENOUS | Status: AC
  Administered 2020-10-01: 2 g via INTRAVENOUS
  Filled 2020-10-01 (×3): qty 50

## 2020-10-01 MED ORDER — CHLORHEXIDINE GLUCONATE CLOTH 2 % EX PADS: 6.0000 | MEDICATED_PAD | Freq: Every day | CUTANEOUS | Status: DC

## 2020-10-01 MED ORDER — ROPINIROLE HCL 1 MG PO TABS
1.0000 mg | ORAL_TABLET | Freq: Three times a day (TID) | ORAL | Status: DC
  Administered 2020-10-01 – 2020-10-04 (×8): 1 mg via ORAL
  Filled 2020-10-01 (×8): qty 1

## 2020-10-01 MED ORDER — ENOXAPARIN SODIUM 40 MG/0.4ML IJ SOSY
40.0000 mg | PREFILLED_SYRINGE | INTRAMUSCULAR | Status: DC
  Administered 2020-10-01 – 2020-10-03 (×3): 40 mg via SUBCUTANEOUS
  Filled 2020-10-01 (×3): qty 0.4

## 2020-10-01 MED ORDER — CLONAZEPAM 0.125 MG PO TBDP
0.1250 mg | ORAL_TABLET | Freq: Three times a day (TID) | ORAL | Status: DC
  Administered 2020-10-01 – 2020-10-04 (×8): 0.125 mg via ORAL
  Filled 2020-10-01 (×8): qty 1

## 2020-10-01 MED ORDER — POTASSIUM CHLORIDE CRYS ER 20 MEQ PO TBCR
40.0000 meq | EXTENDED_RELEASE_TABLET | Freq: Two times a day (BID) | ORAL | Status: DC
  Administered 2020-10-01 – 2020-10-04 (×6): 40 meq via ORAL
  Filled 2020-10-01 (×6): qty 2

## 2020-10-01 MED ORDER — LEVETIRACETAM IN NACL 500 MG/100ML IV SOLN: 500.0000 mg | Freq: Two times a day (BID) | INTRAVENOUS | Status: DC

## 2020-10-01 MED ORDER — ADULT MULTIVITAMIN W/MINERALS CH
1.0000 | ORAL_TABLET | Freq: Every day | ORAL | Status: DC
  Administered 2020-10-02 – 2020-10-04 (×3): 1 via ORAL
  Filled 2020-10-01 (×3): qty 1

## 2020-10-01 MED ORDER — GABAPENTIN 300 MG PO CAPS
300.0000 mg | ORAL_CAPSULE | ORAL | Status: DC
Start: 2020-10-01 — End: 2020-10-01

## 2020-10-01 MED ORDER — METOPROLOL SUCCINATE ER 50 MG PO TB24
50.0000 mg | ORAL_TABLET | Freq: Two times a day (BID) | ORAL | Status: DC
  Administered 2020-10-01 – 2020-10-04 (×4): 50 mg via ORAL
  Filled 2020-10-01 (×6): qty 1

## 2020-10-01 MED ORDER — GABAPENTIN 300 MG PO CAPS
600.0000 mg | ORAL_CAPSULE | Freq: Every day | ORAL | Status: DC
  Administered 2020-10-01 – 2020-10-03 (×3): 600 mg via ORAL
  Filled 2020-10-01 (×3): qty 2

## 2020-10-01 NOTE — Progress Notes (Signed)
Neurology Progress Note  S: Patient had multiple (4+) events overnight involving unresponsiveness, shaking all over, or R sided jerking. One event was aborted by ativan. She tells me today the ativan really helped her. Another event was aborted by sternal rub, after which patient was alert and oriented. Per ICU RN, one event was aborted by provider exiting the room.  MRI brain wwo - no significant findings, but motion-degraded exam rEEG - normal  S:  Vitals:   10/01/20 1217 10/01/20 1255  BP: 113/74 (!) 155/64  Pulse: 99 96  Resp: 18 14  Temp: 98.3 F (36.8 C) 97.8 F (36.6 C)  SpO2: 99% 100%    Physical Exam Gen: A&Ox4, NAD HEENT: Atraumatic, normocephalic; oropharynx clear, tongue without atrophy or fasciculations. Resp: CTAB, normal work of breathing CV: RRR, extremities appear well-perfused. Abd: soft/NT/ND Extrem: Nml bulk; no cyanosis, clubbing, or edema.  Neuro: *MS: A&O x4. Follows multi-step commands.  *Speech: no dysarthria or aphasia, able to name and repeat. *CN:    I: Deferred   II,III: PERRLA, VFF by confrontation, optic discs not visualized 2/2 pupillary constriction   III,IV,VI: EOMI w/o nystagmus, no ptosis   V: Sensation intact from V1 to V3 to LT   VII: Eyelid closure was full.  Smile symmetric.   VIII: Hearing intact to voice   IX,X: Voice normal, palate elevates symmetrically    XI: SCM/trap 5/5 bilat   XII: Tongue protrudes midline, no atrophy or fasciculations  *Motor:   Normal bulk.  No tremor, rigidity or bradykinesia. No pronator drift.   Strength: Dlt Bic Tri WE WrF FgS Gr HF KnF KnE PlF DoF    Left '5 5 5 5 5 5 5 5 5 5 5 5    '$ Right '5 5 5 5 5 5 5 5 5 5 5 5   '$ *Sensory: Intact to light touch, pinprick, temperature vibration throughout. Symmetric. Propioception intact bilat.  No double-simultaneous extinction.  *Coordination:  Finger-to-nose, heel-to-shin, rapid alternating motions were intact. *Reflexes:  2+ and symmetric throughout without  clonus; toes down-going bilat *Gait: deferred  A/P: 50 yo woman w/ hx chronic pain syndrome with thoracic spinal cord stimulator in place (batteries dead), OSA on CPAP, CAD, chronic diastolic HF (EF 99991111), DM2, HTN, HL, incarcerated hernia s/p repair 05/30/20 at [facility] who is BIB EMS as code stroke for R sided jerking f/b R sided weakness. Initially events were felt to be c/f focal seizure however since admission she has had multiple events described above concerning for psychogenic etiology.   - Transfer to hospitalist service at [facility] for cEEG for spell characterization. Notify neurology upon patient arrival. - Continue keppra '500mg'$  bid pending spell characterization - Do not treat with IV ativan unless seizure >5 min (in which case MD should also be paged) or vital sign instability.  Su Monks, MD [facility] [phone]  If 7pm- 7am, please page neurology on call as listed in [place].

## 2020-10-01 NOTE — Progress Notes (Signed)
Report given to Denice Paradise RN at [facility] room 108

## 2020-10-01 NOTE — Progress Notes (Signed)
Patient called out using call bell and states "Help".  Upon entry into patients room to assist her with her needs, she was found shaking with "seizure like" activity.  Pt reassured that staff is present to help her and patient asks for ice chips while still shaking.  Patient brought ice water.  Vital signs stable with no changes throughout event.  Will continue to monitor.

## 2020-10-01 NOTE — Discharge Summary (Signed)
[facility] NAME: Marie Clarke    MR#:  [id number]  DATE OF BIRTH:  Aug 26, 1970  DATE OF ADMISSION:  09/30/2020 ADMITTING PHYSICIAN: Loletha Grayer, MD  DATE OF DISCHARGE to [facility]: 10/01/2020  PRIMARY CARE PHYSICIAN: Sharyne Peach, MD    ADMISSION DIAGNOSIS:  Seizure-like activity ([place]) [R56.9] Abdominal pain, unspecified abdominal location [R10.9] Seizure ([place]) [R56.9]  DISCHARGE DIAGNOSIS:  Principal Problem:   Seizure-like activity ([place]) Active Problems:   Essential (primary) hypertension   Adult hypothyroidism   Apnea, sleep   Type 2 diabetes mellitus ([place])   Incidental lung nodule   Ventral hernia without obstruction or gangrene   Chronic pain syndrome   Presence of neurostimulator (SCS) (June 2021)   Hypokalemia   Adrenal nodule (HCC)   Hyperlipidemia   COPD (chronic obstructive pulmonary disease) (HCC)   Chronic diastolic CHF (congestive heart failure) ([place])   Seizure ([place])   SECONDARY DIAGNOSIS:   Past Medical History:  Diagnosis Date  . Anemia   . Anginal pain ([place])   . Anxiety   . Arthritis   . Asthma   . Bilateral lower extremity edema   . Bipolar disorder ([place])   . CAD (coronary artery disease) unk  . CHF (congestive heart failure) ([place])   . Chronic pain syndrome   . COPD (chronic obstructive pulmonary disease) (HCC)    emphysema  . DDD (degenerative disc disease), lumbar   . Depression unk  . Diabetes mellitus without complication ([place])   . Diabetes mellitus, type II ([place])   . Drug overdose 05/23/2013   after dad died  . Dysrhythmia    history of tachy arrythmias  . Fibromyalgia   . GERD (gastroesophageal reflux disease)   . Headache    chronic migraines  . Hyperlipidemia   . Hypertension   . Hypothyroidism   . Left leg pain 04/29/2014  . MI (myocardial infarction) ([place]) 2005/05/23  . Muscle ache 09/16/2014  . Osteoporosis   . Overactive bladder   . Pancreatitis unk  . Panic attacks    . PTSD (post-traumatic stress disorder)   . Reflex sympathetic dystrophy   . Renal cyst, left   . Renal insufficiency    ckd stage iii  . Restless legs syndrome   . Sleep apnea 06/2019   uses cpap  . Sleep apnea   . Stroke ([place]) 05/23/08   TIA x 2. no residual deficits  . Suicide attempt [facility])    after father died.  . Thyroid disease    thyroid nodule  . TIA (transient ischemic attack) unk  . TIA (transient ischemic attack)     HOSPITAL COURSE:   Seizure activity with Todd's paralysis.  The patient was seen by neurology and given IV Keppra.  No tPA was given secondary to improving right-sided weakness.  The patient had 4 more episodes of seizure activity overnight.  Neurology Dr. Quinn Axe recommended transfer to [facility] for continuous EEG monitoring with hospital to hospital transfer.  MRI showed motion degradation.  EEG was negative for seizure. Chronic diastolic congestive heart failure.  Continue Toprol, Lasix and potassium supplementation Type 2 diabetes mellitus with hyperlipidemia can use sliding scale insulin and hold oral medications.  Continue atorvastatin Essential hypertension on Toprol and Lasix COPD.  Continue Singulair and albuterol as needed History of CAD.  Holding aspirin just in case spinal cord stimulator needs to be removed Bipolar depression anxiety continue Klonopin.  Hopefully can restart Calypta Hypokalemia  continue supplementation Chronic pain syndrome has spinal cord stimulator.  Continue gabapentin Obstructive sleep apnea on CPAP nightly Lung nodule follow-up CT scan in 6 to 12 months Adrenal gland nodule Headache.  We will give IV magnesium  DISCHARGE CONDITIONS:   Satisfactory  CONSULTS OBTAINED:    Neurology Dr. Quinn Axe  DRUG ALLERGIES:   Allergies  Allergen Reactions  . Diazepam Hives and Nausea And Vomiting  . Ziprasidone Hcl Other (See Comments) and Itching    CONFUSED CONFUSED Other reaction(s): Other (See  Comments) CONFUSED CONFUSED   . Azithromycin Hives  . Divalproex Sodium Hives  . Levofloxacin Hives and Rash    1/31: would like to retry since not taking valium  . Lisinopril     Medicine cause kidney failure  . Metronidazole Hives    1/31: would like to retry since she is no longer taking valium  . Sulfa Antibiotics Hives  . Valproic Acid Itching and Rash  . Cephalexin Hives  . Ciprofloxacin Hives  . Doxycycline Rash  . Penicillins Hives    Has patient had a PCN reaction causing immediate rash, facial/tongue/throat swelling, SOB or lightheadedness with hypotension: No Has patient had a PCN reaction causing severe rash involving mucus membranes or skin necrosis: No Has patient had a PCN reaction that required hospitalization: No Has patient had a PCN reaction occurring within the last 10 years: No If all of the above answers are "NO", then may proceed with Cephalosporin use.     DISCHARGE MEDICATIONS:   Allergies as of 10/01/2020       Reactions   Diazepam Hives, Nausea And Vomiting   Ziprasidone Hcl Other (See Comments), Itching   CONFUSED CONFUSED Other reaction(s): Other (See Comments) CONFUSED CONFUSED   Azithromycin Hives   Divalproex Sodium Hives   Levofloxacin Hives, Rash   1/31: would like to retry since not taking valium   Lisinopril    Medicine cause kidney failure   Metronidazole Hives   1/31: would like to retry since she is no longer taking valium   Sulfa Antibiotics Hives   Valproic Acid Itching, Rash   Cephalexin Hives   Ciprofloxacin Hives   Doxycycline Rash   Penicillins Hives   Has patient had a PCN reaction causing immediate rash, facial/tongue/throat swelling, SOB or lightheadedness with hypotension: No Has patient had a PCN reaction causing severe rash involving mucus membranes or skin necrosis: No Has patient had a PCN reaction that required hospitalization: No Has patient had a PCN reaction occurring within the last 10 years: No If all of  the above answers are "NO", then may proceed with Cephalosporin use.        Medication List     STOP taking these medications    aspirin EC 81 MG tablet   dicyclomine 10 MG capsule Commonly known as: BENTYL   ibuprofen 800 MG tablet Commonly known as: ADVIL   Pfizer-BioNT COVID-19 Vac-TriS Susp injection Generic drug: COVID-19 mRNA Vac-TriS (Pfizer)   pregabalin 150 MG capsule Commonly known as: Lyrica   promethazine 25 MG tablet Commonly known as: PHENERGAN   rizatriptan 10 MG tablet Commonly known as: MAXALT   Trulicity 1.5 0000000 Sopn Generic drug: Dulaglutide   zolpidem 10 MG tablet Commonly known as: AMBIEN       TAKE these medications    atorvastatin 40 MG tablet Commonly known as: LIPITOR Take 40 mg by mouth daily.   Caplyta 42 MG capsule Generic drug: lumateperone tosylate Take 42 mg by mouth at bedtime.  cholecalciferol 25 MCG (1000 UNIT) tablet Commonly known as: VITAMIN D3 Take 1,000 Units by mouth daily.   clonazepam 0.125 MG disintegrating tablet Commonly known as: KLONOPIN Take 0.125 mg by mouth 3 (three) times daily.   cyanocobalamin 1000 MCG tablet Take 1,000 mcg by mouth daily.   esomeprazole 40 MG capsule Commonly known as: NEXIUM Take by mouth.   famotidine 20 MG tablet Commonly known as: PEPCID Take 20 mg by mouth at bedtime. PT TAKES 2 TABLETS AT NIGHT   furosemide 80 MG tablet Commonly known as: LASIX Take 80 mg by mouth 2 (two) times daily.   gabapentin 300 MG capsule Commonly known as: NEURONTIN Take 300-600 mg by mouth See admin instructions. Take 300 mg in the morning and noon and 600 mg at bedtime   levETIRAcetam 500 MG/100ML Soln Commonly known as: KEPRRA Inject 100 mLs (500 mg total) into the vein every 12 (twelve) hours.   levocetirizine 5 MG tablet Commonly known as: XYZAL Take 5 mg by mouth at bedtime.   levothyroxine 112 MCG tablet Commonly known as: SYNTHROID Take 224 mcg by mouth daily before  breakfast.   metoprolol succinate 50 MG 24 hr tablet Commonly known as: TOPROL-XL Take 50 mg by mouth 2 (two) times daily.   montelukast 10 MG tablet Commonly known as: SINGULAIR Take 10 mg by mouth at bedtime.   multivitamin tablet Take 1 tablet by mouth daily.   nitroGLYCERIN 0.4 MG SL tablet Commonly known as: NITROSTAT Place 0.4 mg under the tongue every 5 (five) minutes as needed for chest pain.   potassium chloride SA 20 MEQ tablet Commonly known as: KLOR-CON Take 40 mEq by mouth 2 (two) times daily.   ProAir RespiClick [id number] (90 Base) MCG/ACT Aepb Generic drug: Albuterol Sulfate Inhale 2 puffs into the lungs every 6 (six) hours as needed.   rOPINIRole 1 MG tablet Commonly known as: REQUIP Take 1 mg by mouth 3 (three) times daily.   vitamin C 500 MG tablet Commonly known as: ASCORBIC ACID Take 500 mg by mouth daily.   vitamin E 180 MG (400 UNITS) capsule Take 400 Units by mouth daily.         DISCHARGE INSTRUCTIONS:   Follow-up with [facility] day  If you experience worsening of your admission symptoms, develop shortness of breath, life threatening emergency, suicidal or homicidal thoughts you must seek medical attention immediately by calling 911 or calling your MD immediately  if symptoms less severe.  You Must read complete instructions/literature along with all the possible adverse reactions/side effects for all the Medicines you take and that have been prescribed to you. Take any new Medicines after you have completely understood and accept all the possible adverse reactions/side effects.   Please note  You were cared for by a hospitalist during your hospital stay. If you have any questions about your discharge medications or the care you received while you were in the hospital after you are discharged, you can call the unit and asked to speak with the hospitalist on call if the hospitalist that took care of you is not available. Once you are discharged,  your primary care physician will handle any further medical issues. Please note that NO REFILLS for any discharge medications will be authorized once you are discharged, as it is imperative that you return to your primary care physician (or establish a relationship with a primary care physician if you do not have one) for your aftercare needs so that they can reassess  your need for medications and monitor your lab values.    Today   CHIEF COMPLAINT:   Chief Complaint  Patient presents with  . Code Stroke    HISTORY OF PRESENT ILLNESS:  Marie Clarke  is a 50 y.o. female presented with code stroke and seen by neurology and tPA was not given secondary to improving right-sided weakness thought more to be secondary to seizure.   VITAL SIGNS:  Blood pressure (!) 83/54, pulse 98, temperature 98.3 F (36.8 C), temperature source Oral, resp. rate 18, height '5\' 4"'$  (1.626 m), weight 102.3 kg, SpO2 97 %. Last blood pressure 106/83, pulse 109  PHYSICAL EXAMINATION:  GENERAL:  50 y.o.-year-old patient lying in the bed with no acute distress.  EYES: Pupils equal, round, reactive to light and accommodation. No scleral icterus. Extraocular muscles intact.  When looking to the right the patient closes her eyes. HEENT: Head atraumatic, normocephalic. Oropharynx and nasopharynx clear.  LUNGS: Normal breath sounds bilaterally, no wheezing, rales,rhonchi or crepitation. No use of accessory muscles of respiration.  CARDIOVASCULAR: S1, S2 normal. No murmurs, rubs, or gallops.  ABDOMEN: Soft, non-tender, non-distended.  EXTREMITIES: No pedal edema, cyanosis, or clubbing.  NEUROLOGIC: Cranial nerves II through XII are intact.  Power 4 out of 5 on the right side and 5 out of 5 on the left side PSYCHIATRIC: The patient is alert and oriented x 3.  SKIN: No obvious rash, lesion, or ulcer.   DATA REVIEW:   CBC Recent Labs  Lab 09/30/20 1716  WBC 8.7  HGB 11.5*  HCT 34.7*  PLT 263    Chemistries  Recent  Labs  Lab 09/30/20 1716 10/01/20 0902  NA 135 137  K 3.0* 3.9  CL 97* 100  CO2 29 25  GLUCOSE 97 127*  BUN 14 15  CREATININE 0.89 0.60  CALCIUM 8.6* 9.2  MG 1.8 2.1  AST 17  --   ALT 17  --   ALKPHOS 127*  --   BILITOT 0.6  --      Microbiology Results  Results for orders placed or performed during the hospital encounter of 09/30/20  Resp Panel by RT-PCR (Flu A&B, Covid) Nasopharyngeal Swab     Status: None   Collection Time: 09/30/20  6:38 PM   Specimen: Nasopharyngeal Swab; Nasopharyngeal(NP) swabs in vial transport medium  Result Value Ref Range Status   SARS Coronavirus 2 by RT PCR NEGATIVE NEGATIVE Final    Comment: (NOTE) SARS-CoV-2 target nucleic acids are NOT DETECTED.  The SARS-CoV-2 RNA is generally detectable in upper respiratory specimens during the acute phase of infection. The lowest concentration of SARS-CoV-2 viral copies this assay can detect is 138 copies/mL. A negative result does not preclude SARS-Cov-2 infection and should not be used as the sole basis for treatment or other patient management decisions. A negative result may occur with  improper specimen collection/handling, submission of specimen other than nasopharyngeal swab, presence of viral mutation(s) within the areas targeted by this assay, and inadequate number of viral copies(<138 copies/mL). A negative result must be combined with clinical observations, patient history, and epidemiological information. The expected result is Negative.  Fact Sheet for Patients:  EntrepreneurPulse.com.au  Fact Sheet for Healthcare Providers:  IncredibleEmployment.be  This test is no t yet approved or cleared by the [place] FDA and  has been authorized for detection and/or diagnosis of SARS-CoV-2 by FDA under an Emergency Use Authorization (EUA). This EUA will remain  in effect (meaning this test can be used)  for the duration of the COVID-19 declaration under  Section 564(b)(1) of the Act, 21 U.S.C.section 360bbb-3(b)(1), unless the authorization is terminated  or revoked sooner.       Influenza A by PCR NEGATIVE NEGATIVE Final   Influenza B by PCR NEGATIVE NEGATIVE Final    Comment: (NOTE) The Xpert Xpress SARS-CoV-2/FLU/RSV plus assay is intended as an aid in the diagnosis of influenza from Nasopharyngeal swab specimens and should not be used as a sole basis for treatment. Nasal washings and aspirates are unacceptable for Xpert Xpress SARS-CoV-2/FLU/RSV testing.  Fact Sheet for Patients: EntrepreneurPulse.com.au  Fact Sheet for Healthcare Providers: IncredibleEmployment.be  This test is not yet approved or cleared by the [place] FDA and has been authorized for detection and/or diagnosis of SARS-CoV-2 by FDA under an Emergency Use Authorization (EUA). This EUA will remain in effect (meaning this test can be used) for the duration of the COVID-19 declaration under Section 564(b)(1) of the Act, 21 U.S.C. section 360bbb-3(b)(1), unless the authorization is terminated or revoked.  Performed at [facility], [address] [place], [place] [address]   MRSA Next Gen by PCR, Nasal     Status: None   Collection Time: 10/01/20  4:54 AM   Specimen: Nasal Mucosa; Nasal Swab  Result Value Ref Range Status   MRSA by PCR Next Gen NOT DETECTED NOT DETECTED Final    Comment: (NOTE) The GeneXpert MRSA Assay (FDA approved for NASAL specimens only), is one component of a comprehensive MRSA colonization surveillance program. It is not intended to diagnose MRSA infection nor to guide or monitor treatment for MRSA infections. Test performance is not FDA approved in patients less than 29 years old. Performed at [facility], [address] [place], [place] [address]     RADIOLOGY:  MR BRAIN W WO CONTRAST  Result Date: 10/01/2020 CLINICAL DATA:  Transient ischemic attack EXAM: MRI  HEAD WITHOUT AND WITH CONTRAST TECHNIQUE: Multiplanar, multiecho pulse sequences of the brain and surrounding structures were obtained without and with intravenous contrast. CONTRAST:  52m GADAVIST GADOBUTROL 1 MMOL/ML IV SOLN COMPARISON:  None. FINDINGS: Markedly motion degraded examination. Brain: No acute infarct, mass effect or extra-axial collection. No acute or chronic hemorrhage. Normal white matter signal, parenchymal volume and CSF spaces. The midline structures are normal. There is no abnormal contrast enhancement. Vascular: Major flow voids are preserved. Skull and upper cervical spine: Normal calvarium and skull base. Visualized upper cervical spine and soft tissues are normal. Sinuses/Orbits:No paranasal sinus fluid levels or advanced mucosal thickening. No mastoid or middle ear effusion. Normal orbits. IMPRESSION: Markedly motion degraded examination without acute intracranial abnormality. Electronically Signed   By: KUlyses JarredM.D.   On: 10/01/2020 02:46   EEG adult  Result Date: 09/30/2020 SDerek Jack MD     09/30/2020  7:48 PM Routine EEG Report MMalta Doersamis a 50y.o. female with a history of spells who is undergoing an EEG to evaluate for seizures. Report: This EEG was acquired with electrodes placed according to the International 10-20 electrode system (including Fp1, Fp2, F3, F4, C3, C4, P3, P4, O1, O2, T3, T4, T5, T6, A1, A2, Fz, Cz, Pz). The following electrodes were missing or displaced: none. The occipital dominant rhythm was 8.5 Hz. This activity is reactive to stimulation. Drowsiness was manifested by background fragmentation; deeper stages of sleep were manifested by K complexes and sleep spindles. There was no focal slowing. There were no interictal epileptiform discharges. There were no electrographic seizures  identified. There was no abnormal response to photic stimulation or hyperventilation. Impression: This EEG was obtained while awake and asleep and is normal.    Clinical Correlation: Normal EEGs, however, do not rule out epilepsy. Su Monks, MD [facility] [phone] If 7pm- 7am, please page neurology on call as listed in [place].   CT Angio Chest/Abd/Pel for Dissection W and/or W/WO  Result Date: 09/30/2020 CLINICAL DATA:  Abdominal pain. Code stroke, had ABD surgery in April when pt started to have a R sided facial droop and slurred speech. EXAM: CT ANGIOGRAPHY CHEST, ABDOMEN AND PELVIS TECHNIQUE: Non-contrast CT of the chest was initially obtained. Multidetector CT imaging through the chest, abdomen and pelvis was performed using the standard protocol during bolus administration of intravenous contrast. Multiplanar reconstructed images and MIPs were obtained and reviewed to evaluate the vascular anatomy. CONTRAST:  139m OMNIPAQUE IOHEXOL 350 MG/ML SOLN COMPARISON:  CT abdomen pelvis 04/27/2020, CT abdomen pelvis 12/02/2019, CT abdomen pelvis 02/07/2019, CT abdomen pelvis 08/01/2017, CT abdomen pelvis 04/23/2015 FINDINGS: CTA CHEST FINDINGS Cardiovascular: Preferential opacification of the thoracic aorta. No evidence of thoracic aortic aneurysm or dissection. No atherosclerotic plaque of the thoracic aorta. No coronary artery calcifications.Normal heart size. No significant pericardial effusion. The main pulmonary artery is normal in caliber. No central pulmonary embolus. Mediastinum/Nodes: No enlarged mediastinal, hilar, or axillary lymph nodes. Thyroid gland, trachea, and esophagus demonstrate no significant findings. Lungs/Pleura: Similar-appearing foci of linear atelectasis versus scarring within the lingula with a slightly more rounded nodular-like region measuring 7 mm. No focal consolidation. No pulmonary nodule. No pulmonary mass. No pleural effusion. No pneumothorax. Musculoskeletal: No chest wall abnormality. No suspicious lytic or blastic osseous lesions. No acute displaced fracture. Multilevel degenerative changes of the spine. Review of  the MIP images confirms the above findings. CTA ABDOMEN AND PELVIS FINDINGS VASCULAR Aorta: Normal caliber aorta without aneurysm, dissection, vasculitis or significant stenosis. Celiac: Patent without evidence of aneurysm, dissection, vasculitis or significant stenosis. SMA: Patent without evidence of aneurysm, dissection, vasculitis or significant stenosis. Renals: Both renal arteries are patent without evidence of aneurysm, dissection, vasculitis, fibromuscular dysplasia or significant stenosis. IMA: Patent without evidence of aneurysm, dissection, vasculitis or significant stenosis. Inflow: Patent without evidence of aneurysm, dissection, vasculitis or significant stenosis. Veins: No obvious venous abnormality within the limitations of this arterial phase study. Review of the MIP images confirms the above findings. NON-VASCULAR Hepatobiliary: No focal liver abnormality. No gallstones, gallbladder wall thickening, or pericholecystic fluid. No biliary dilatation. Pancreas: No focal lesion. Normal pancreatic contour. No surrounding inflammatory changes. No main pancreatic ductal dilatation. Spleen: Normal in size without focal abnormality. Adrenals/Urinary Tract: Slight interval increase in size of a 2 cm (from 1.7 cm) left adrenal gland nodule with a density of 85 Hounsfield units. No right adrenal gland nodule. Bilateral kidneys enhance symmetrically. Bilateral renal excretion of previously administered intravenous contrast is symmetrical (recent CT angiography of the head and neck). No hydronephrosis. No hydroureter. The urinary bladder is unremarkable. There is no urothelial wall thickening and there are no filling defects in the opacified portions of the bilateral collecting systems or ureters nor of the urinary bladder. Stomach/Bowel: Query sigmoid surgical changes. Stomach is within normal limits. No evidence of bowel wall thickening or dilatation. The appendix is not definitely identified. Lymphatic: No  lymphadenopathy. Reproductive: Status post hysterectomy. No adnexal masses. Other: No intraperitoneal free fluid. No intraperitoneal free gas. No organized fluid collection. Musculoskeletal: Small fat containing supraumbilical ventral wall hernia (9:101) with an abdominal defect of 1.1 cm.  And a supraumbilical ventral wall hernia containing a short loop of small bowel (9:99) with an abdominal defect of 2.5 cm. Neural stimulator within the subcutaneus soft tissues of back with leads entering the central canal at the L1-L2 level and terminating along the posterior central canal at the T7 through T10 levels. No suspicious lytic or blastic osseous lesions. No acute displaced fracture. Multilevel degenerative changes of the spine. Left hip degenerative changes. Review of the MIP images confirms the above findings. IMPRESSION: 1. No acute vascular abnormality. 2. Lingular atelectasis versus scarring with an associated 7 mm nodule. Initial follow-up with CT at 6-12 months is recommended to confirm persistence. If persistent, repeat CT is recommended every 2 years until 5 years of stability has been established. This recommendation follows the consensus statement: Guidelines for Management of Incidental Pulmonary Nodules Detected on CT Images: From the Fleischner Society 2017; Radiology 2017; 284:228-243. 3. Two supraumbilical ventral wall hernias: One containing fat and other containing a short loop of small bowel with no findings of associated ischemia or bowel obstruction. 4. Slight interval increase in size of an indeterminate 2 cm left adrenal gland nodule. Recommend CT adrenal gland protocol further evaluation. Electronically Signed   By: Iven Finn M.D.   On: 09/30/2020 17:27   CT HEAD CODE STROKE WO CONTRAST  Result Date: 09/30/2020 CLINICAL DATA:  Code stroke. EXAM: CT HEAD WITHOUT CONTRAST TECHNIQUE: Contiguous axial images were obtained from the base of the skull through the vertex without intravenous  contrast. COMPARISON:  09/29/2018. FINDINGS: Brain: Mild hypodensity in the right temporal lobe (series 4, image 9), which is favored to be artifactual. No definite acute infarct, hemorrhage, hydrocephalus, extra-axial collection, mass, or mass effect. Vascular: No hyperdense vessel or unexpected calcification. Skull: Normal. Negative for fracture or focal lesion. Sinuses/Orbits: No acute finding. Other: None ASPECTS ([place] Stroke Program Early CT Score) - Ganglionic level infarction (caudate, lentiform nuclei, internal capsule, insula, M1-M3 cortex): 7 - Supraganglionic infarction (M4-M6 cortex): 3 Total score (0-10 with 10 being normal): 10 IMPRESSION: 1. Mild hypodensity in the right temporal lobe, which is favored to be artifactual. No definite acute infarct. No hemorrhage. 2. ASPECTS is 10 Code stroke imaging results were communicated on 09/30/2020 at 4:26 pm to provider Dr. Vladimir Crofts via telephone, who verbally acknowledged these results. Electronically Signed   By: Merilyn Baba M.D.   On: 09/30/2020 16:29   CT ANGIO HEAD NECK W WO CM W PERF (CODE STROKE)  Result Date: 09/30/2020 CLINICAL DATA:  Right-sided facial droop and slurred speech EXAM: CT ANGIOGRAPHY HEAD AND NECK CT PERFUSION BRAIN TECHNIQUE: Multidetector CT imaging of the head and neck was performed using the standard protocol during bolus administration of intravenous contrast. Multiplanar CT image reconstructions and MIPs were obtained to evaluate the vascular anatomy. Carotid stenosis measurements (when applicable) are obtained utilizing NASCET criteria, using the distal internal carotid diameter as the denominator. Multiphase CT imaging of the brain was performed following IV bolus contrast injection. Subsequent parametric perfusion maps were calculated using RAPID software. CONTRAST:  132m OMNIPAQUE IOHEXOL 350 MG/ML SOLN COMPARISON:  None. FINDINGS: Evaluation limited by poor contrast bolus timing. CTA NECK Aortic arch: Arch and  great vessel origins are unremarkable. Right carotid system: Patent.  No stenosis. Left carotid system: Patent.  No stenosis. Vertebral arteries: Patent. Proximal right vertebral artery is poorly visualized due to artifact. No definite stenosis. Skeleton: Degenerative changes at C5-C6. Other neck: Unremarkable. Upper chest: Included upper lungs are clear. Review of the MIP images  confirms the above findings CTA HEAD Anterior circulation: Intracranial internal carotid arteries are patent. Proximal anterior and middle cerebral arteries appear patent at the circle-of-Willis. No definite M2 or A2 occlusion. Posterior circulation: Intracranial vertebral arteries, basilar artery, and proximal posterior cerebral arteries are patent. Venous sinuses: Patent as allowed by contrast bolus timing. Review of the MIP images confirms the above findings CT Brain Perfusion Findings: Degraded by motion. CBF (<30%) Volume: 79m Perfusion (Tmax>6.0s) volume: 135mMismatch Volume: 1249mnfarction Location: Calculated penumbra within both inferior frontal lobes. IMPRESSION: Suboptimal evaluation due to contrast bolus timing. No large vessel occlusion. No hemodynamically significant stenosis in the neck. Perfusion imaging demonstrates a calculated penumbra within both inferior frontal lobes of 12 mL. This is favored to be artifactual. These results were called by telephone at the time of interpretation on 09/30/2020 at 4:49 pm to provider [facility] verbally acknowledged these results. Electronically Signed   By: PraMacy MisD.   On: 09/30/2020 16:56     Management plans discussed with the patient, family and they are in agreement.  CODE STATUS:     Code Status Orders  (From admission, onward)           Start     Ordered   09/30/20 1859  Limited resuscitation (code)  Continuous       Question Answer Comment  In the event of cardiac or respiratory ARREST: Initiate Code Blue, Call Rapid Response Yes   In the  event of cardiac or respiratory ARREST: Perform CPR Yes   In the event of cardiac or respiratory ARREST: Perform Intubation/Mechanical Ventilation No   In the event of cardiac or respiratory ARREST: Use NIPPV/BiPAp only if indicated Yes   In the event of cardiac or respiratory ARREST: Administer ACLS medications if indicated Yes   In the event of cardiac or respiratory ARREST: Perform Defibrillation or Cardioversion if indicated Yes      09/30/20 1901           Code Status History     Date Active Date Inactive Code Status Order ID Comments User Context   03/29/2020 2147 03/31/2020 1726 Full Code [id number] MormonD ED   12/26/2019 1525 12/28/2019 0425 DNR [id number] SpragueO Inpatient   12/02/2019 1858 12/09/2019 1851 Full Code [id number] MasseD ED   11/17/2018 0225 11/18/2018 1453 Full Code [id number] ForemanD ED   09/29/2018 1511 10/01/2018 1508 Full Code [id number] SnowP ED   04/15/2017 0057 04/16/2017 1905 DNR [id number] JoD ED   09/17/2016 1615 09/18/2016 1940 Full Code [id number] LeschesD ED   08/10/2014 0100 08/11/2014 1707 DNR [id number] CoonD Inpatient       TOTAL TIME TAKING CARE OF THIS PATIENT: 32 minutes.    RicLoletha GrayerD on 10/01/2020 at 10:23 AM   [facility]  CC: Primary care physician; GeoSharyne PeachD

## 2020-10-01 NOTE — Progress Notes (Addendum)
Patient arrived to ICU bed 9 from 1C at 04:15. GCS15, VSS, room air, pupils equal and reactive. At 04:35 pt developed bilateral upper arm shaking, was able to follow commands and move eyes appropriately to command (during arm movements). Episode lasted approximately 1 minute. Grip strength equal and tongue midline. Seizure precautions including bed pads in place, patient education not to get out of bed, bed alarm on. CHG wipe given.    Erling Conte, RN

## 2020-10-01 NOTE — Progress Notes (Signed)
Patient verbally aggressive and swatted at my hand when attempting to replace SpO2 probe back on ear. Patient states she "doesn't care what time it is you need to let me eat and drink I am diabetic". Ms. Zentgraf also upset nursing staff would not let her sit on side of bed. Educated patient due to her frequent seizure-like activity for her safety, seizure precautions have been implemented including NPO status, bed padding, and not sitting on side of bed. Erling Conte

## 2020-10-01 NOTE — Progress Notes (Signed)
Report given to [facility] for transport and report called to [facility] [place] for transfer.  All questions answered.  Patient will be going to [place] room 10.  Patient informed of bed assigned so she can let her family know where she is going.  [facility] en route to transport patient.

## 2020-10-01 NOTE — Progress Notes (Signed)
Walking by pts room, noticed "seizure like" activity. Myself, the RT and the NT went in to check. Pt stopped shaking when sternal rub was performed. Immediately started talking and complaining of headache, tylenol given.

## 2020-10-01 NOTE — Progress Notes (Addendum)
Pt is upset that her hair was not cleaned and upset at me for not washing it out immediately.... Went into room to bring her a basin and some wash cloths so she could do it.... pt took a look at me then went to into a seizure like activity and would not respond but as soon as I left to page provider she stopped and let lab come in and stick her  Seizure like activity/jerking movement lasted for about 15 seconds.   Awaiting call from MD.   Martin Majestic back into pt's room to see how she was doing and was on her mobile phone...Marland Kitchen pt is currently washing her face and hair with no acute distress.

## 2020-10-01 NOTE — H&P (Signed)
History and Physical    Marie Clarke RDE:[id number] DOB: 1970-04-04 DOA: 10/01/2020  PCP: Sharyne Peach, MD  Patient coming from: Home, [facility]  I have personally briefly reviewed patient's old medical records in [place]  Chief Complaint: Seizure like activity  HPI: Marie Clarke is a 50 y.o. female with medical history significant of CAD, chronic diastolic CHF (EF 63-14%), T2DM, HTN, HLD, hypothyroidism, COPD, bipolar disorder/depression/anxiety, chronic pain syndrome s/p thoracic spinal cord stimulator placement, OSA on CPAP, and incarcerated ventral hernia s/p repair 05/30/2020 who presented to the ED at [facility] on 09/30/20 for evaluation of right facial droop and right-sided weakness.  Patient reported new onset of nausea and vomiting with mild lower abdominal pain beginning earlier yesterday.  She had hernia surgery at [facility] in April 2022 and thought this was related to her hernia.  She called EMS and was being transported to [facility] when she developed rhythmic jerking of her right upper extremity followed by weakness of her right face, arm, and leg.  She was rerouted to [facility] ED as a code stroke.   She was seen by neurology on arrival in which time she was noted to have continued right-sided facial droop with some right upper extremity weakness.  She was alert and interactive.  She was taken to CT where she had a right focal seizure witnessed by neurology.  She was aphasic and then afterwards did not make sense when speaking.  She underwent imaging with CT head, CTA head/neck, and CTA chest/abdomen/pelvis as below.  Patient was given IV Keppra load.  On initial hospitalist evaluation she was back to baseline, alert and fully oriented.  She was giving good history with clear speech.  She denied any further nausea, vomiting, abdominal pain.  She informed admitting hospitalist that she is scheduled to have her spinal cord stimulator removed on 10/03/2020.  She otherwise denied any chest pain or dyspnea.    ED Course:  Initial vitals showed BP 128/75, pulse 115, RR 13, temp not recorded, SPO2 100% on room air.   Labs showed WBC 8.7, hemoglobin 11.5, platelets 263,000, sodium 135, potassium 3.0, bicarb 29, BUN 14, creatinine 0.89, serum glucose 97, AST 17, ALT 17, alk phos 127, total bilirubin 0.6.   SARS-CoV-2 PCR panel ordered and pending collection.   Patient arrived as a code stroke.  CT head without contrast showed a mild hypodensity in the right temporal lobe which was favored to be artifactual.  No definite acute infarct or hemorrhage seen.   CTA head/neck was a suboptimal evaluation due to contrast bolus timing.  No LVO or hemodynamically significant stenosis in the neck seen.  Perfusion imaging demonstrated a calculated penumbra within both inferior lobes which was also favored to be artifactual.   CTA chest/abdomen/pelvis was negative for acute vascular abnormality.  2 supraumbilical ventral wall hernias were seen, 1 containing fat and other containing short loop of small bowel with no findings of associated ischemia or bowel obstruction.  Incidental 7 mm lingular nodule and increased interval size of an indeterminate 2 cm left adrenal gland nodule also noted.   Per EDP, general surgery reviewed CT abdomen images and did not see any acutely concerning findings regarding abdominal wall hernias.  Neurology were consulted and patient was loaded with IV Keppra 2000 mg once.  Medical admission for seizure work-up was recommended.  EEG was performed and interpretation pending.  The hospitalist service was consulted to admit for further evaluation and management.   [facility] hospital course: Pt started  on IV Keppra, Pt had 4 additional seizure like events overnight; however, some of these events were questionable for psychogenic non-epileptic seizures.  Additionally EEG was negative for seizure activity (see Dr. Livia Snellen note).  Neurology felt pt should be transferred to [facility] for continuous EEG monitoring  and pt was transferred.  At this time, pt states she is anxious / unable to sleep.  Is asking for Ativan by name to help her sleep.   Review of Systems: As per HPI, otherwise all review of systems negative.  Past Medical History:  Diagnosis Date   Anemia    Anginal pain (HCC)    Anxiety    Arthritis    Asthma    Bilateral lower extremity edema    Bipolar disorder ([place])    CAD (coronary artery disease) unk   CHF (congestive heart failure) (HCC)    Chronic pain syndrome    COPD (chronic obstructive pulmonary disease) ([place])    emphysema   DDD (degenerative disc disease), lumbar    Depression unk   Diabetes mellitus without complication ([place])    Diabetes mellitus, type II ([place])    Drug overdose 24-Apr-2013   after dad died   Dysrhythmia    history of tachy arrythmias   Fibromyalgia    GERD (gastroesophageal reflux disease)    Headache    chronic migraines   Hyperlipidemia    Hypertension    Hypothyroidism    Left leg pain 04/29/2014   MI (myocardial infarction) ([place]) 04/24/2005   Muscle ache 09/16/2014   Osteoporosis    Overactive bladder    Pancreatitis unk   Panic attacks    PTSD (post-traumatic stress disorder)    Reflex sympathetic dystrophy    Renal cyst, left    Renal insufficiency    ckd stage iii   Restless legs syndrome    Sleep apnea 06/2019   uses cpap   Sleep apnea    Stroke ([place]) 04-24-2008   TIA x 2. no residual deficits   Suicide attempt [facility])    after father died.   Thyroid disease    thyroid nodule   TIA (transient ischemic attack) unk   TIA (transient ischemic attack)     Past Surgical History:  Procedure Laterality Date   ABDOMINAL HYSTERECTOMY     CARDIAC CATHETERIZATION     HERNIA REPAIR  07/15/2017   [facility]   lumbar facet     several   MELANOMA EXCISION  03/2020   OUTSIDE OF RECTUM   prolapse rectum surgery N/A 08/2014   THORACIC LAMINECTOMY FOR SPINAL CORD STIMULATOR N/A 07/27/2019   Procedure: THORACIC SPINAL CORD STIMULATOR PLACEMENT WITH RIGHT  FLANK PULSE GENERATOR;  Surgeon: [name], MD;  Location: [facility] ORS;  Service: Neurosurgery;  Laterality: N/A;   TONSILLECTOMY       reports that she quit smoking about 2 years ago. Her smoking use included cigarettes. She smoked an average of .5 packs per day. She has never used smokeless tobacco. She reports that she does not drink alcohol and does not use drugs.  Allergies  Allergen Reactions   Diazepam Hives and Nausea And Vomiting   Ziprasidone Hcl Other (See Comments) and Itching    CONFUSED CONFUSED Other reaction(s): Other (See Comments) CONFUSED CONFUSED    Azithromycin Hives   Divalproex Sodium Hives   Levofloxacin Hives and Rash    1/31: would like to retry since not taking valium   Lisinopril     Medicine cause kidney failure   Metronidazole  Hives    1/31: would like to retry since she is no longer taking valium   Sulfa Antibiotics Hives   Valproic Acid Itching and Rash   Cephalexin Hives   Ciprofloxacin Hives   Doxycycline Rash   Penicillins Hives    Has patient had a PCN reaction causing immediate rash, facial/tongue/throat swelling, SOB or lightheadedness with hypotension: No Has patient had a PCN reaction causing severe rash involving mucus membranes or skin necrosis: No Has patient had a PCN reaction that required hospitalization: No Has patient had a PCN reaction occurring within the last 10 years: No If all of the above answers are "NO", then may proceed with Cephalosporin use.     Family History  Problem Relation Age of Onset   Diabetes Mellitus II Mother    CAD Mother    Sleep apnea Mother    Osteoarthritis Mother    Osteoporosis Mother    Anxiety disorder Mother    Depression Mother    Bipolar disorder Mother    Bipolar disorder Father    Hypertension Father    Depression Father    Anxiety disorder Father    Post-traumatic stress disorder Sister      Prior to Admission medications   Medication Sig Start Date End Date Taking?  Authorizing Provider  Albuterol Sulfate (PROAIR RESPICLICK) 989 (90 Base) MCG/ACT AEPB Inhale 2 puffs into the lungs every 6 (six) hours as needed. 10/26/19   [provider]  atorvastatin (LIPITOR) 40 MG tablet Take 40 mg by mouth daily. 04/05/20   [provider]  CAPLYTA 42 MG capsule Take 42 mg by mouth at bedtime. 09/20/20   [provider]  cholecalciferol (VITAMIN D3) 25 MCG (1000 UNIT) tablet Take 1,000 Units by mouth daily.    [provider]  clonazepam (KLONOPIN) 0.125 MG disintegrating tablet Take 0.125 mg by mouth 3 (three) times daily. 08/25/20   [provider]  cyanocobalamin 1000 MCG tablet Take 1,000 mcg by mouth daily.    [provider]  esomeprazole (NEXIUM) 40 MG capsule Take by mouth. 12/23/19 03/29/20  [provider]  famotidine (PEPCID) 20 MG tablet Take 20 mg by mouth at bedtime. PT TAKES 2 TABLETS AT NIGHT    [provider]  furosemide (LASIX) 80 MG tablet Take 80 mg by mouth 2 (two) times daily. 09/08/20   [provider]  gabapentin (NEURONTIN) 300 MG capsule Take 300-600 mg by mouth See admin instructions. Take 300 mg in the morning and noon and 600 mg at bedtime 08/25/20   [provider]  levETIRAcetam (KEPRRA) 500 MG/100ML SOLN Inject 100 mLs (500 mg total) into the vein every 12 (twelve) hours. 10/01/20   Loletha Grayer, MD  levocetirizine (XYZAL) 5 MG tablet Take 5 mg by mouth at bedtime. 01/08/20   [provider]  levothyroxine (SYNTHROID) 112 MCG tablet Take 224 mcg by mouth daily before breakfast. 11/10/19 11/09/20  [provider]  metoprolol succinate (TOPROL-XL) 50 MG 24 hr tablet Take 50 mg by mouth 2 (two) times daily. 04/29/20 04/29/21  [provider]  montelukast (SINGULAIR) 10 MG tablet Take 10 mg by mouth at bedtime.    [provider]  Multiple Vitamin (MULTIVITAMIN) tablet Take 1 tablet by mouth daily.    [provider]   nitroGLYCERIN (NITROSTAT) 0.4 MG SL tablet Place 0.4 mg under the tongue every 5 (five) minutes as needed for chest pain. 04/21/20   [provider]  potassium chloride SA (KLOR-CON) 20  MEQ tablet Take 40 mEq by mouth 2 (two) times daily. 12/25/19   [provider]  rOPINIRole (REQUIP) 1 MG tablet Take 1 mg by mouth 3 (three) times daily. 12/10/19   [provider]  vitamin C (ASCORBIC ACID) 500 MG tablet Take 500 mg by mouth daily.    [provider]  vitamin E 180 MG (400 UNITS) capsule Take 400 Units by mouth daily.    [provider]    Physical Exam: There were no vitals filed for this visit.  Constitutional: NAD, calm, comfortable Eyes: PERRL, lids and conjunctivae normal ENMT: Mucous membranes are moist. Posterior pharynx clear of any exudate or lesions.Normal dentition.  Neck: normal, supple, no masses, no thyromegaly Respiratory: clear to auscultation bilaterally, no wheezing, no crackles. Normal respiratory effort. No accessory muscle use.  Cardiovascular: Regular rate and rhythm, no murmurs / rubs / gallops. No extremity edema. 2+ pedal pulses. No carotid bruits.  Abdomen: no tenderness, no masses palpated. No hepatosplenomegaly. Bowel sounds positive.  Musculoskeletal: no clubbing / cyanosis. No joint deformity upper and lower extremities. Good ROM, no contractures. Normal muscle tone.  Skin: no rashes, lesions, ulcers. No induration Neurologic: CN 2-12 grossly intact. Sensation intact, DTR normal. Strength 5/5 in all 4.  Psychiatric: Normal judgment and insight. Alert and oriented x 3. Thought process is goal oriented.   Labs on Admission: I have personally reviewed following labs and imaging studies  CBC: Recent Labs  Lab 09/30/20 1716  WBC 8.7  NEUTROABS 6.1  HGB 11.5*  HCT 34.7*  MCV 88.5  PLT 888   Basic Metabolic Panel: Recent Labs  Lab 09/30/20 1716 10/01/20 0902  NA 135 137  K 3.0* 3.9  CL 97* 100  CO2 29 25   GLUCOSE 97 127*  BUN 14 15  CREATININE 0.89 0.60  CALCIUM 8.6* 9.2  MG 1.8 2.1   GFR: Estimated Creatinine Clearance: 100.1 mL/min (by C-G formula based on SCr of 0.6 mg/dL). Liver Function Tests: Recent Labs  Lab 09/30/20 1716  AST 17  ALT 17  ALKPHOS 127*  BILITOT 0.6  PROT 7.8  ALBUMIN 3.8   No results for input(s): LIPASE, AMYLASE in the last 168 hours. No results for input(s): AMMONIA in the last 168 hours. Coagulation Profile: Recent Labs  Lab 09/30/20 1716  INR 1.1   Cardiac Enzymes: No results for input(s): CKTOTAL, CKMB, CKMBINDEX, TROPONINI in the last 168 hours. BNP (last 3 results) No results for input(s): PROBNP in the last 8760 hours. HbA1C: No results for input(s): HGBA1C in the last 72 hours. CBG: Recent Labs  Lab 09/30/20 2310 10/01/20 0759 10/01/20 1116 10/01/20 1659  GLUCAP 122* 145* 140* 136*   Lipid Profile: No results for input(s): CHOL, HDL, LDLCALC, TRIG, CHOLHDL, LDLDIRECT in the last 72 hours. Thyroid Function Tests: No results for input(s): TSH, T4TOTAL, FREET4, T3FREE, THYROIDAB in the last 72 hours. Anemia Panel: No results for input(s): VITAMINB12, FOLATE, FERRITIN, TIBC, IRON, RETICCTPCT in the last 72 hours. Urine analysis:    Component Value Date/Time   COLORURINE YELLOW (A) 04/27/2020 2349   APPEARANCEUR CLEAR (A) 04/27/2020 2349   LABSPEC >1.046 (H) 04/27/2020 2349   PHURINE 5.0 04/27/2020 2349   GLUCOSEU NEGATIVE 04/27/2020 2349   HGBUR NEGATIVE 04/27/2020 2349   BILIRUBINUR NEGATIVE 04/27/2020 2349   KETONESUR NEGATIVE 04/27/2020 2349   PROTEINUR NEGATIVE 04/27/2020 2349   NITRITE NEGATIVE 04/27/2020 2349   LEUKOCYTESUR NEGATIVE 04/27/2020 2349    Radiological Exams on Admission: MR BRAIN W  WO CONTRAST  Result Date: 10/01/2020 CLINICAL DATA:  Transient ischemic attack EXAM: MRI HEAD WITHOUT AND WITH CONTRAST TECHNIQUE: Multiplanar, multiecho pulse sequences of the brain and surrounding structures were obtained  without and with intravenous contrast. CONTRAST:  35mL GADAVIST GADOBUTROL 1 MMOL/ML IV SOLN COMPARISON:  None. FINDINGS: Markedly motion degraded examination. Brain: No acute infarct, mass effect or extra-axial collection. No acute or chronic hemorrhage. Normal white matter signal, parenchymal volume and CSF spaces. The midline structures are normal. There is no abnormal contrast enhancement. Vascular: Major flow voids are preserved. Skull and upper cervical spine: Normal calvarium and skull base. Visualized upper cervical spine and soft tissues are normal. Sinuses/Orbits:No paranasal sinus fluid levels or advanced mucosal thickening. No mastoid or middle ear effusion. Normal orbits. IMPRESSION: Markedly motion degraded examination without acute intracranial abnormality. Electronically Signed   By: Ulyses Jarred M.D.   On: 10/01/2020 02:46   EEG adult  Result Date: 09/30/2020 Derek Jack, MD     09/30/2020  7:48 PM Routine EEG Report Veera Stapleton is a 50 y.o. female with a history of spells who is undergoing an EEG to evaluate for seizures. Report: This EEG was acquired with electrodes placed according to the International 10-20 electrode system (including Fp1, Fp2, F3, F4, C3, C4, P3, P4, O1, O2, T3, T4, T5, T6, A1, A2, Fz, Cz, Pz). The following electrodes were missing or displaced: none. The occipital dominant rhythm was 8.5 Hz. This activity is reactive to stimulation. Drowsiness was manifested by background fragmentation; deeper stages of sleep were manifested by K complexes and sleep spindles. There was no focal slowing. There were no interictal epileptiform discharges. There were no electrographic seizures identified. There was no abnormal response to photic stimulation or hyperventilation. Impression: This EEG was obtained while awake and asleep and is normal.   Clinical Correlation: Normal EEGs, however, do not rule out epilepsy. Su Monks, MD [facility] [phone] If 7pm- 7am,  please page neurology on call as listed in [place].   CT Angio Chest/Abd/Pel for Dissection W and/or W/WO  Result Date: 09/30/2020 CLINICAL DATA:  Abdominal pain. Code stroke, had ABD surgery in April when pt started to have a R sided facial droop and slurred speech. EXAM: CT ANGIOGRAPHY CHEST, ABDOMEN AND PELVIS TECHNIQUE: Non-contrast CT of the chest was initially obtained. Multidetector CT imaging through the chest, abdomen and pelvis was performed using the standard protocol during bolus administration of intravenous contrast. Multiplanar reconstructed images and MIPs were obtained and reviewed to evaluate the vascular anatomy. CONTRAST:  139mL OMNIPAQUE IOHEXOL 350 MG/ML SOLN COMPARISON:  CT abdomen pelvis 04/27/2020, CT abdomen pelvis 12/02/2019, CT abdomen pelvis 02/07/2019, CT abdomen pelvis 08/01/2017, CT abdomen pelvis 04/23/2015 FINDINGS: CTA CHEST FINDINGS Cardiovascular: Preferential opacification of the thoracic aorta. No evidence of thoracic aortic aneurysm or dissection. No atherosclerotic plaque of the thoracic aorta. No coronary artery calcifications.Normal heart size. No significant pericardial effusion. The main pulmonary artery is normal in caliber. No central pulmonary embolus. Mediastinum/Nodes: No enlarged mediastinal, hilar, or axillary lymph nodes. Thyroid gland, trachea, and esophagus demonstrate no significant findings. Lungs/Pleura: Similar-appearing foci of linear atelectasis versus scarring within the lingula with a slightly more rounded nodular-like region measuring 7 mm. No focal consolidation. No pulmonary nodule. No pulmonary mass. No pleural effusion. No pneumothorax. Musculoskeletal: No chest wall abnormality. No suspicious lytic or blastic osseous lesions. No acute displaced fracture. Multilevel degenerative changes of the spine. Review of the MIP images confirms the above findings. CTA ABDOMEN AND PELVIS FINDINGS VASCULAR  Aorta: Normal caliber aorta without aneurysm,  dissection, vasculitis or significant stenosis. Celiac: Patent without evidence of aneurysm, dissection, vasculitis or significant stenosis. SMA: Patent without evidence of aneurysm, dissection, vasculitis or significant stenosis. Renals: Both renal arteries are patent without evidence of aneurysm, dissection, vasculitis, fibromuscular dysplasia or significant stenosis. IMA: Patent without evidence of aneurysm, dissection, vasculitis or significant stenosis. Inflow: Patent without evidence of aneurysm, dissection, vasculitis or significant stenosis. Veins: No obvious venous abnormality within the limitations of this arterial phase study. Review of the MIP images confirms the above findings. NON-VASCULAR Hepatobiliary: No focal liver abnormality. No gallstones, gallbladder wall thickening, or pericholecystic fluid. No biliary dilatation. Pancreas: No focal lesion. Normal pancreatic contour. No surrounding inflammatory changes. No main pancreatic ductal dilatation. Spleen: Normal in size without focal abnormality. Adrenals/Urinary Tract: Slight interval increase in size of a 2 cm (from 1.7 cm) left adrenal gland nodule with a density of 85 Hounsfield units. No right adrenal gland nodule. Bilateral kidneys enhance symmetrically. Bilateral renal excretion of previously administered intravenous contrast is symmetrical (recent CT angiography of the head and neck). No hydronephrosis. No hydroureter. The urinary bladder is unremarkable. There is no urothelial wall thickening and there are no filling defects in the opacified portions of the bilateral collecting systems or ureters nor of the urinary bladder. Stomach/Bowel: Query sigmoid surgical changes. Stomach is within normal limits. No evidence of bowel wall thickening or dilatation. The appendix is not definitely identified. Lymphatic: No lymphadenopathy. Reproductive: Status post hysterectomy. No adnexal masses. Other: No intraperitoneal free fluid. No intraperitoneal  free gas. No organized fluid collection. Musculoskeletal: Small fat containing supraumbilical ventral wall hernia (9:101) with an abdominal defect of 1.1 cm. And a supraumbilical ventral wall hernia containing a short loop of small bowel (9:99) with an abdominal defect of 2.5 cm. Neural stimulator within the subcutaneus soft tissues of back with leads entering the central canal at the L1-L2 level and terminating along the posterior central canal at the T7 through T10 levels. No suspicious lytic or blastic osseous lesions. No acute displaced fracture. Multilevel degenerative changes of the spine. Left hip degenerative changes. Review of the MIP images confirms the above findings. IMPRESSION: 1. No acute vascular abnormality. 2. Lingular atelectasis versus scarring with an associated 7 mm nodule. Initial follow-up with CT at 6-12 months is recommended to confirm persistence. If persistent, repeat CT is recommended every 2 years until 5 years of stability has been established. This recommendation follows the consensus statement: Guidelines for Management of Incidental Pulmonary Nodules Detected on CT Images: From the Fleischner Society 2017; Radiology 2017; 284:228-243. 3. Two supraumbilical ventral wall hernias: One containing fat and other containing a short loop of small bowel with no findings of associated ischemia or bowel obstruction. 4. Slight interval increase in size of an indeterminate 2 cm left adrenal gland nodule. Recommend CT adrenal gland protocol further evaluation. Electronically Signed   By: Iven Finn M.D.   On: 09/30/2020 17:27   CT HEAD CODE STROKE WO CONTRAST  Result Date: 09/30/2020 CLINICAL DATA:  Code stroke. EXAM: CT HEAD WITHOUT CONTRAST TECHNIQUE: Contiguous axial images were obtained from the base of the skull through the vertex without intravenous contrast. COMPARISON:  09/29/2018. FINDINGS: Brain: Mild hypodensity in the right temporal lobe (series 4, image 9), which is favored  to be artifactual. No definite acute infarct, hemorrhage, hydrocephalus, extra-axial collection, mass, or mass effect. Vascular: No hyperdense vessel or unexpected calcification. Skull: Normal. Negative for fracture or focal lesion. Sinuses/Orbits: No acute finding. Other:  None ASPECTS ([place] Stroke Program Early CT Score) - Ganglionic level infarction (caudate, lentiform nuclei, internal capsule, insula, M1-M3 cortex): 7 - Supraganglionic infarction (M4-M6 cortex): 3 Total score (0-10 with 10 being normal): 10 IMPRESSION: 1. Mild hypodensity in the right temporal lobe, which is favored to be artifactual. No definite acute infarct. No hemorrhage. 2. ASPECTS is 10 Code stroke imaging results were communicated on 09/30/2020 at 4:26 pm to provider Dr. Vladimir Crofts via telephone, who verbally acknowledged these results. Electronically Signed   By: Merilyn Baba M.D.   On: 09/30/2020 16:29   CT ANGIO HEAD NECK W WO CM W PERF (CODE STROKE)  Result Date: 09/30/2020 CLINICAL DATA:  Right-sided facial droop and slurred speech EXAM: CT ANGIOGRAPHY HEAD AND NECK CT PERFUSION BRAIN TECHNIQUE: Multidetector CT imaging of the head and neck was performed using the standard protocol during bolus administration of intravenous contrast. Multiplanar CT image reconstructions and MIPs were obtained to evaluate the vascular anatomy. Carotid stenosis measurements (when applicable) are obtained utilizing NASCET criteria, using the distal internal carotid diameter as the denominator. Multiphase CT imaging of the brain was performed following IV bolus contrast injection. Subsequent parametric perfusion maps were calculated using RAPID software. CONTRAST:  130mL OMNIPAQUE IOHEXOL 350 MG/ML SOLN COMPARISON:  None. FINDINGS: Evaluation limited by poor contrast bolus timing. CTA NECK Aortic arch: Arch and great vessel origins are unremarkable. Right carotid system: Patent.  No stenosis. Left carotid system: Patent.  No stenosis. Vertebral  arteries: Patent. Proximal right vertebral artery is poorly visualized due to artifact. No definite stenosis. Skeleton: Degenerative changes at C5-C6. Other neck: Unremarkable. Upper chest: Included upper lungs are clear. Review of the MIP images confirms the above findings CTA HEAD Anterior circulation: Intracranial internal carotid arteries are patent. Proximal anterior and middle cerebral arteries appear patent at the circle-of-Willis. No definite M2 or A2 occlusion. Posterior circulation: Intracranial vertebral arteries, basilar artery, and proximal posterior cerebral arteries are patent. Venous sinuses: Patent as allowed by contrast bolus timing. Review of the MIP images confirms the above findings CT Brain Perfusion Findings: Degraded by motion. CBF (<30%) Volume: 40mL Perfusion (Tmax>6.0s) volume: 78mL Mismatch Volume: 54mL Infarction Location: Calculated penumbra within both inferior frontal lobes. IMPRESSION: Suboptimal evaluation due to contrast bolus timing. No large vessel occlusion. No hemodynamically significant stenosis in the neck. Perfusion imaging demonstrates a calculated penumbra within both inferior frontal lobes of 12 mL. This is favored to be artifactual. These results were called by telephone at the time of interpretation on 09/30/2020 at 4:49 pm to provider [facility] , who verbally acknowledged these results. Electronically Signed   By: Macy Mis M.D.   On: 09/30/2020 16:56    EKG: Independently reviewed.  Assessment/Plan Principal Problem:   Seizure-like activity (HCC) Active Problems:   Essential (primary) hypertension   Type 2 diabetes mellitus with hyperlipidemia (HCC)   Chronic pain syndrome   History of psychiatric symptoms    Seizure activity with Todd's paralysis - The patient was seen by neurology and given IV Keppra. No tPA was given secondary to improving right-sided weakness. The patient had 4 more episodes of seizure-like activity overnight. Though some  of these concerning for psychogenic, non-epileptic seizures, see Dr. Livia Snellen note. Also suspicious that pt immediately asking me for ativan by name to "calm her down" and "help her sleep". Neurology Dr. Quinn Axe recommended transfer to [facility] for continuous EEG monitoring with hospital to hospital transfer. MRI showed motion degradation. EEG at [facility] was negative for seizure. Have  notified neurology of PT arrival to [facility] Have ordered seizure precautions and tele monitor Have ordered overnight EEG with video Per Dr. Artemio Aly note: avoid ativan unless seizure lasting more than 5 mins, call neurology in this case. Chronic diastolic congestive heart failure- Continue Toprol, Lasix and potassium supplementation Type 2 diabetes mellitus with hyperlipidemia- use sliding scale insulin and hold oral medications. Continue atorvastatin Essential hypertension - Continue home Toprol and Lasix COPD - Continue Singulair and albuterol as needed History of CAD - Holding aspirin just in case spinal cord stimulator needs to be removed Bipolar depression anxiety- continue Klonopin. Hopefully can restart Calypta, on hold currently due to seizure like activity Hypokalemia- Resolved as of this AM Repeat BMP in AM Recheck MAG in AM Chronic pain syndrome- has spinal cord stimulator but batteries dead. Continue gabapentin Avoid opiates defer candidacy for these to pain clinic (see also PCP note from 09/08/20) Obstructive sleep apnea- on CPAP nightly Lung nodule- follow-up CT scan in 6 to 12 months Adrenal gland nodule  DVT prophylaxis: Lovenox Code Status: Full Family Communication: No family in room Disposition Plan: home after cleared by neurology Consults called: Dr. Lorrin Goodell Admission status: Place in obs     Mataio Mele, [place] [facility]  How to contact the [facility] Attending or Consulting provider [place] or covering provider during after hours [place], for this patient?   Check the care team in [facility] and look for a) attending/consulting TRH provider listed and b) the [facility], The team listed Log into [email or web address]  Amion Physician Scheduling and messaging for groups and whole hospitals  On call and physician scheduling software for group practices, residents, hospitalists and other medical providers for call, clinic, rotation and shift schedules. OnCall Enterprise is a hospital-wide system for scheduling doctors and paging doctors on call. EasyPlot is for scientific plotting and data analysis.  [email or web address]  and use [place]'s universal password to access. If you do not have the password, please contact the hospital operator.  Locate the [facility] provider you are looking for under [facility] and page to a number that you can be directly reached. If you still have difficulty reaching the provider, please page the [facility] (Director on Call) for the Hospitalists listed on amion for assistance.  10/01/2020, 8:24 PM

## 2020-10-01 NOTE — Progress Notes (Signed)
Patient called out using the call bell and states that she "feels funny".  When staff entered the room, patient began shaking and having "seizure like" activity.  Patient would open her eyes periodically and ask if she was having a seizure and the start shaking again.  Patient's HR and O2 sats remained stable throughout event and vitals taken after event were WNL.  MD notified with no new orders.  Will continue to monitor.

## 2020-10-01 NOTE — Progress Notes (Signed)
Pt shaking, with "seizure like" activity again, went in, and gently rubbed pts arm and asked if she was ok, to which pt responded " I don't know why this keeps happening".

## 2020-10-01 NOTE — Progress Notes (Signed)
Patient had generalized seizure in MRI and became unresponsive afterward. A code blue was called and after team responded patient became responsive and was oriented. While waiting for transport back to room after MRI was delayed patient had another 2 minute generalized seizure. Ativan was given before patient was transported back to room 108 (see MAR).

## 2020-10-02 ENCOUNTER — Observation Stay (HOSPITAL_COMMUNITY): Payer: Medicare Other

## 2020-10-02 ENCOUNTER — Encounter (HOSPITAL_COMMUNITY): Payer: Self-pay | Admitting: Internal Medicine

## 2020-10-02 DIAGNOSIS — Z8659 Personal history of other mental and behavioral disorders: Secondary | ICD-10-CM | POA: Diagnosis not present

## 2020-10-02 DIAGNOSIS — I10 Essential (primary) hypertension: Secondary | ICD-10-CM | POA: Diagnosis not present

## 2020-10-02 DIAGNOSIS — G894 Chronic pain syndrome: Secondary | ICD-10-CM | POA: Diagnosis not present

## 2020-10-02 DIAGNOSIS — R569 Unspecified convulsions: Secondary | ICD-10-CM | POA: Diagnosis not present

## 2020-10-02 LAB — BASIC METABOLIC PANEL
Anion gap: 11 (ref 5–15)
BUN: 11 mg/dL (ref 6–20)
CO2: 28 mmol/L (ref 22–32)
Calcium: 9.6 mg/dL (ref 8.9–10.3)
Chloride: 100 mmol/L (ref 98–111)
Creatinine, Ser: 0.69 mg/dL (ref 0.44–1.00)
GFR, Estimated: >60 mL/min (ref >60)
Glucose, Bld: 106 mg/dL — ABNORMAL HIGH (ref 70–99)
Potassium: 3.5 mmol/L (ref 3.5–5.1)
Sodium: 139 mmol/L (ref 135–145)

## 2020-10-02 LAB — CBC
HCT: 35.6 % — ABNORMAL LOW (ref 36.0–46.0)
Hemoglobin: 11.6 g/dL — ABNORMAL LOW (ref 12.0–15.0)
MCH: 28.6 pg (ref 26.0–34.0)
MCHC: 32.6 g/dL (ref 30.0–36.0)
MCV: 87.9 fL (ref 80.0–100.0)
Platelets: 263 10*3/uL (ref 150–400)
RBC: 4.05 MIL/uL (ref 3.87–5.11)
RDW: 13.9 % (ref 11.5–15.5)
WBC: 8.4 10*3/uL (ref 4.0–10.5)
nRBC: 0 % (ref 0.0–0.2)

## 2020-10-02 LAB — GLUCOSE, CAPILLARY
Glucose-Capillary: 121 mg/dL — ABNORMAL HIGH (ref 70–99)
Glucose-Capillary: 101 mg/dL — ABNORMAL HIGH (ref 70–99)
Glucose-Capillary: 138 mg/dL — ABNORMAL HIGH (ref 70–99)
Glucose-Capillary: 114 mg/dL — ABNORMAL HIGH (ref 70–99)

## 2020-10-02 LAB — MAGNESIUM: Magnesium: 1.8 mg/dL (ref 1.7–2.4)

## 2020-10-02 MED ORDER — MELATONIN 5 MG PO TABS
5.0000 mg | ORAL_TABLET | Freq: Every day | ORAL | Status: DC
  Administered 2020-10-02 – 2020-10-03 (×2): 5 mg via ORAL
  Filled 2020-10-02 (×2): qty 1

## 2020-10-02 MED ORDER — BUTALBITAL-APAP-CAFFEINE 50-325-40 MG PO TABS
1.0000 | ORAL_TABLET | Freq: Four times a day (QID) | ORAL | Status: DC | PRN
  Administered 2020-10-02 – 2020-10-03 (×6): 1 via ORAL
  Filled 2020-10-02 (×6): qty 1

## 2020-10-02 MED ORDER — ZOLPIDEM TARTRATE 5 MG PO TABS
5.0000 mg | ORAL_TABLET | Freq: Every evening | ORAL | Status: DC | PRN
  Administered 2020-10-02 (×2): 5 mg via ORAL
  Filled 2020-10-02 (×2): qty 1

## 2020-10-02 MED ORDER — LORAZEPAM 2 MG/ML IJ SOLN
INTRAMUSCULAR | Status: AC
  Filled 2020-10-02: qty 1

## 2020-10-02 MED ORDER — LUMATEPERONE TOSYLATE 42 MG PO CAPS
42.0000 mg | ORAL_CAPSULE | Freq: Every day | ORAL | Status: DC
  Filled 2020-10-02: qty 1

## 2020-10-02 NOTE — Progress Notes (Signed)
Patient is on continuous EEG at this time. Declined CPAP use HS.

## 2020-10-02 NOTE — Consult Note (Signed)
NEUROLOGY CONSULTATION NOTE   Date of service: October 02, 2020 Patient Name: Marie Clarke MRN:  [id number] DOB:  02-Mar-1970 Reason for consult: "events concerning for seizure vs pseudoseizures" Requesting Provider: Etta Quill, DO _ _ _   _ __   _ __ _ _  __ __   _ __   __ _  History of Present Illness  Marie Clarke is a 50 y.o. female with PMH significant for chronic pain, Osa on CPAP, CAD, chronic dCHF, DM2, HTN, hernia repair at [facility] who was seen at [facility] for R sided jerking and R sided weakness. Symptoms improved by the time she was brought in. She had a witnessed episode concerning for focal seizure in the ED with R sided twitching and aphasia. CTH with no acute intracranial abnormality, CTA with no definitive LVO(poor contrast timing thou).  Routine EEG was negative for seizures. MRI brain was motion degraded but no significant findings. While at [facility], she had 4 episodes overnight with unresponsiveness. Per notes, one event was aborted by ativan. She tells me today the ativan really helped her. Another event was aborted by sternal rub, after which patient was alert and oriented. Per ICU RN, one event was aborted by provider exiting the room.  She was transferred to [facility] for further characterization of these events. No prior hx of seizures. Reports family hx of seizures on her dad's side. No prior Cns surgery, no CNS infection, no significant head injury with LOC.    ROS   Constitutional Denies weight loss, fever and chills.   HEENT Denies changes in vision and hearing.   Respiratory Denies SOB and cough.   CV Denies palpitations and CP   GI Denies abdominal pain, nausea, vomiting and diarrhea.   GU Denies dysuria and urinary frequency.   MSK Denies myalgia and joint pain.   Skin Denies rash and pruritus.   Neurological Endorses  headache but no syncope.   Psychiatric Denies recent changes in mood. Denies anxiety and depression.    Past History   Past Medical History:  Diagnosis Date   . Anemia   . Anginal pain ([place])   . Anxiety   . Arthritis   . Asthma   . Bilateral lower extremity edema   . Bipolar disorder ([place])   . CAD (coronary artery disease) unk  . CHF (congestive heart failure) ([place])   . Chronic pain syndrome   . COPD (chronic obstructive pulmonary disease) (HCC)    emphysema  . DDD (degenerative disc disease), lumbar   . Depression unk  . Diabetes mellitus without complication ([place])   . Diabetes mellitus, type II ([place])   . Drug overdose May 22, 2013   after dad died  . Dysrhythmia    history of tachy arrythmias  . Fibromyalgia   . GERD (gastroesophageal reflux disease)   . Headache    chronic migraines  . Hyperlipidemia   . Hypertension   . Hypothyroidism   . Left leg pain 04/29/2014  . MI (myocardial infarction) ([place]) May 22, 2005  . Muscle ache 09/16/2014  . Osteoporosis   . Overactive bladder   . Pancreatitis unk  . Panic attacks   . PTSD (post-traumatic stress disorder)   . Reflex sympathetic dystrophy   . Renal cyst, left   . Renal insufficiency    ckd stage iii  . Restless legs syndrome   . Sleep apnea 06/2019   uses cpap  . Sleep apnea   . Stroke ([place]) 2008/05/22   TIA x 2. no  residual deficits  . Suicide attempt [facility])    after father died.  . Thyroid disease    thyroid nodule  . TIA (transient ischemic attack) unk  . TIA (transient ischemic attack)    Past Surgical History:  Procedure Laterality Date  . ABDOMINAL HYSTERECTOMY    . CARDIAC CATHETERIZATION    . HERNIA REPAIR  07/15/2017   [facility]  . lumbar facet     several  . MELANOMA EXCISION  03/2020   OUTSIDE OF RECTUM  . prolapse rectum surgery N/A 08/2014  . THORACIC LAMINECTOMY FOR SPINAL CORD STIMULATOR N/A 07/27/2019   Procedure: THORACIC SPINAL CORD STIMULATOR PLACEMENT WITH RIGHT FLANK PULSE GENERATOR;  Surgeon: [name], MD;  Location: [facility] ORS;  Service: Neurosurgery;  Laterality: N/A;  . TONSILLECTOMY     Family History  Problem Relation Age of Onset  . Diabetes Mellitus  II Mother   . CAD Mother   . Sleep apnea Mother   . Osteoarthritis Mother   . Osteoporosis Mother   . Anxiety disorder Mother   . Depression Mother   . Bipolar disorder Mother   . Bipolar disorder Father   . Hypertension Father   . Depression Father   . Anxiety disorder Father   . Post-traumatic stress disorder Sister    Social History   Socioeconomic History  . Marital status: Single    Spouse name: Not on file  . Number of children: 0  . Years of education: Not on file  . Highest education level: High school graduate  Occupational History    Comment: not employed  Tobacco Use  . Smoking status: Former    Packs/day: 0.50    Types: Cigarettes    Quit date: 09/01/2018    Years since quitting: 2.0  . Smokeless tobacco: Never  . Tobacco comments:    Patient quit smoking 09/01/2018  Vaping Use  . Vaping Use: Never used  Substance and Sexual Activity  . Alcohol use: No    Alcohol/week: 0.0 standard drinks  . Drug use: No  . Sexual activity: Not Currently  Other Topics Concern  . Not on file  Social History Narrative   Patient lives with aunt and uncle.   Social Determinants of Health   Financial Resource Strain: Not on file  Food Insecurity: Not on file  Transportation Needs: Not on file  Physical Activity: Not on file  Stress: Not on file  Social Connections: Not on file   Allergies  Allergen Reactions  . Diazepam Hives and Nausea And Vomiting  . Ziprasidone Hcl Other (See Comments) and Itching    CONFUSED CONFUSED Other reaction(s): Other (See Comments) CONFUSED CONFUSED   . Azithromycin Hives  . Divalproex Sodium Hives  . Levofloxacin Hives and Rash    1/31: would like to retry since not taking valium  . Lisinopril     Medicine cause kidney failure  . Metronidazole Hives    1/31: would like to retry since she is no longer taking valium  . Sulfa Antibiotics Hives  . Valproic Acid Itching and Rash  . Cephalexin Hives  . Ciprofloxacin Hives  .  Doxycycline Rash  . Penicillins Hives    Has patient had a PCN reaction causing immediate rash, facial/tongue/throat swelling, SOB or lightheadedness with hypotension: No Has patient had a PCN reaction causing severe rash involving mucus membranes or skin necrosis: No Has patient had a PCN reaction that required hospitalization: No Has patient had a PCN reaction occurring within the last 10  years: No If all of the above answers are "NO", then may proceed with Cephalosporin use.     Medications   Medications Prior to Admission  Medication Sig Dispense Refill Last Dose  . aspirin EC 81 MG tablet Take 81 mg by mouth daily. Swallow whole.   09/25/2020 at 1200  . atorvastatin (LIPITOR) 40 MG tablet Take 40 mg by mouth at bedtime.   09/29/2020  . PROAIR HFA 108 (90 Base) MCG/ACT inhaler Inhale 2 puffs into the lungs every 6 (six) hours as needed for wheezing or shortness of breath.   unk  . SYMBICORT 160-4.5 MCG/ACT inhaler Inhale 2 puffs into the lungs 2 (two) times daily.   09/29/2020  . albuterol (VENTOLIN HFA) 108 (90 Base) MCG/ACT inhaler Inhale into the lungs every 6 (six) hours as needed for wheezing or shortness of breath.     . Albuterol Sulfate (PROAIR RESPICLICK) 123XX123 (90 Base) MCG/ACT AEPB Inhale 2 puffs into the lungs every 6 (six) hours as needed. (Patient not taking: Reported on 10/01/2020)   Not Taking  . CAPLYTA 42 MG capsule Take 42 mg by mouth at bedtime.     . cholecalciferol (VITAMIN D3) 25 MCG (1000 UNIT) tablet Take 1,000 Units by mouth daily.     . clonazepam (KLONOPIN) 0.125 MG disintegrating tablet Take 0.125 mg by mouth 3 (three) times daily.     . cyanocobalamin 1000 MCG tablet Take 1,000 mcg by mouth daily.     Marland Kitchen esomeprazole (NEXIUM) 40 MG capsule Take by mouth.     . famotidine (PEPCID) 20 MG tablet Take 20 mg by mouth at bedtime. PT TAKES 2 TABLETS AT NIGHT     . furosemide (LASIX) 80 MG tablet Take 80 mg by mouth 2 (two) times daily.     Marland Kitchen gabapentin (NEURONTIN) 300  MG capsule Take 300-600 mg by mouth See admin instructions. Take 300 mg in the morning and noon and 600 mg at bedtime     . levETIRAcetam (KEPRRA) 500 MG/100ML SOLN Inject 100 mLs (500 mg total) into the vein every 12 (twelve) hours. 4000 mL    . levocetirizine (XYZAL) 5 MG tablet Take 5 mg by mouth at bedtime.     Marland Kitchen levothyroxine (SYNTHROID) 112 MCG tablet Take 224 mcg by mouth daily before breakfast.     . metoprolol succinate (TOPROL-XL) 50 MG 24 hr tablet Take 50 mg by mouth 2 (two) times daily.     . montelukast (SINGULAIR) 10 MG tablet Take 10 mg by mouth at bedtime.     . Multiple Vitamin (MULTIVITAMIN) tablet Take 1 tablet by mouth daily.     . nitroGLYCERIN (NITROSTAT) 0.4 MG SL tablet Place 0.4 mg under the tongue every 5 (five) minutes as needed for chest pain.     . potassium chloride SA (KLOR-CON) 20 MEQ tablet Take 40 mEq by mouth 2 (two) times daily.     Marland Kitchen rOPINIRole (REQUIP) 1 MG tablet Take 1 mg by mouth 3 (three) times daily.     . vitamin C (ASCORBIC ACID) 500 MG tablet Take 500 mg by mouth daily.     . vitamin E 180 MG (400 UNITS) capsule Take 400 Units by mouth daily.        Vitals   Vitals:   10/01/20 1840 10/01/20 2240 10/01/20 2352 10/02/20 0333  BP:   103/87 101/71  Pulse:  99 95 96  Resp:  '16 16 15  '$ Temp:   (!) 97.5 F (36.4 C) 97.8  F (36.6 C)  TempSrc:   Axillary Oral  SpO2:  98% 97% 94%  Weight: 102 kg     Height: '5\' 4"'$  (1.626 m)        Body mass index is 38.6 kg/m.  Physical Exam   General: Laying comfortably in bed; in no acute distress.  HENT: Normal oropharynx and mucosa. Normal external appearance of ears and nose.  Neck: Supple, no pain or tenderness  CV: No JVD. No peripheral edema.  Pulmonary: Symmetric Chest rise. Normal respiratory effort.  Abdomen: Soft to touch, non-tender.  Ext: No cyanosis, edema, or deformity  Skin: No rash. Normal palpation of skin.   Musculoskeletal: Normal digits and nails by inspection. No clubbing.    Neurologic Examination  Mental status/Cognition: Alert, oriented to self, place, month and year, good attention.  Speech/language: Fluent, comprehension intact, object naming intact, repetition intact.  Cranial nerves:   CN II Pupils equal and reactive to light, no VF deficits    CN III,IV,VI EOM intact, no gaze preference or deviation, no nystagmus   CN V normal sensation in V1, V2, and V3 segments bilaterally    CN VII no asymmetry, no nasolabial fold flattening    CN VIII normal hearing to speech    CN IX & X normal palatal elevation, no uvular deviation    CN XI 5/5 head turn and 5/5 shoulder shrug bilaterally    CN XII midline tongue protrusion    Motor:  Muscle bulk: normal, tone normal, pronator drift none tremor none Mvmt Root Nerve  Muscle Right Left Comments  SA C5/6 Ax Deltoid 5 5   EF C5/6 Mc Biceps 4+ 5   EE C6/7/8 Rad Triceps 4+ 5   WF C6/7 Med FCR     WE C7/8 PIN ECU     F Ab C8/T1 U ADM/FDI 4+ 5   HF L1/2/3 Fem Illopsoas 5 5   KE L2/3/4 Fem Quad 5 5   DF L4/5 D Peron Tib Ant 5 5   PF S1/2 Tibial Grc/Sol 5 5    Reflexes:  Right Left Comments  Pectoralis      Biceps (C5/6) 2 2   Brachioradialis (C5/6) 2 2    Triceps (C6/7) 2 2    Patellar (L3/4) 2 2    Achilles (S1)      Hoffman      Plantar     Jaw jerk    Sensation:  Light touch intact   Pin prick    Temperature    Vibration   Proprioception    Coordination/Complex Motor:  - Finger to Nose intact BL - Heel to shin intact BL - Rapid alternating movement are normal - Gait: Deferred.  Labs   CBC:  Recent Labs  Lab 09/30/20 1716  WBC 8.7  NEUTROABS 6.1  HGB 11.5*  HCT 34.7*  MCV 88.5  PLT 99991111    Basic Metabolic Panel:  Lab Results  Component Value Date   NA 137 10/01/2020   K 3.9 10/01/2020   CO2 25 10/01/2020   GLUCOSE 127 (H) 10/01/2020   BUN 15 10/01/2020   CREATININE 0.60 10/01/2020   CALCIUM 9.2 10/01/2020   GFRNONAA >60 10/01/2020   GFRAA >60 10/25/2019   Lipid  Panel: No results found for: LDLCALC HgbA1c:  Lab Results  Component Value Date   HGBA1C 6.5 (H) 10/01/2020   Urine Drug Screen:     Component Value Date/Time   LABOPIA NONE DETECTED 04/02/2020 2330   COCAINSCRNUR NONE  DETECTED 04/02/2020 2330   LABBENZ NONE DETECTED 04/02/2020 2330   AMPHETMU NONE DETECTED 04/02/2020 2330   THCU NONE DETECTED 04/02/2020 2330   LABBARB NONE DETECTED 04/02/2020 2330    Alcohol Level     Component Value Date/Time   [facility] <10 09/29/2018 0826    MRI Brain: Personally reviewed and motion degraded examination without acute intracranial abnormality.  rEEG:  No seizures or epileptiform discharges.  Impression   Marie Clarke is a 50 y.o. female with PMH significant for significant for chronic pain, Osa on CPAP, CAD, chronic dCHF, DM2, HTN, hernia repair at [facility] who was seen at [facility] for R sided jerking and R sided weakness and is sent here of characterization of these episodes. Her neurologic examination is notable for mild RUE weakness which appears effort dependent. 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  Recommendations  - cEEG ordered. Will be hooked up in AM - Seizure precautions - I discontinued her Keppra. Further AEDs based on cEEG. ____________________________________________________________________   Thank you for the opportunity to take part in the care of this patient. If you have any further questions, please contact the neurology consultation attending.  Signed,  [place] Pager Number [phone] _ _ _   _ __   _ __ _ _  __ __   _ __   __ _

## 2020-10-02 NOTE — Progress Notes (Signed)
PROGRESS NOTE        PATIENT DETAILS Name: Marie Clarke Age: 50 y.o. Sex: female Date of Birth: January 10, 1971 Admit Date: 10/01/2020 Admitting Physician Etta Quill, DO [id number], Rubbie Battiest, MD  Brief Narrative: Patient is a 50 y.o. 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  While at 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  See below for further details.  Significant events: 8/19>> admit to [facility]-possible seizure-like activity with Todd's paresis/right facial droop. 8/20>> transfer to [facility] for continuous EEG monitoring  Significant studies: 819>> CT angio head/neck: No LVO-no significant stenosis in neck. 8/19>> CT angio chest/abdomen/pelvis: No acute vascular abnormality, 2 supraumbilical ventral wall hernias, 7 mm lung nodule, 2 cm left adrenal nodule 8/19>> spot EEG: no seizure-like activity 8/20>> MRI brain: No acute intracranial abnormality.  Antimicrobial therapy: None  Microbiology data: 8/19>> influenza/COVID PCR: Negative  Procedures : None  Consults: Neurology  DVT Prophylaxis : enoxaparin (LOVENOX) injection 40 mg Start: 10/01/20 2115   Subjective: Pretty awake and alert this morning.  Per nursing staff-she had another seizure-like activity this morning but she was completely awake and alert and was able to talk while having seizure-like activity!  Per patient-she is under a lot of stress-as she is unable to sleep.  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  No longer on Keppra-await neurology opinion-await continuous EEG report.    Chronic  diastolic heart failure: Euvolemic-continue Lasix/Toprol  HTN: Stable-continue Toprol/Lasix  CAD: No anginal symptoms-suspect aspirin can be resumed.  COPD: Lungs clear-not in exacerbation-continue bronchodilators  DM-2 (A1c 6.5 on 8/20): CBG stable on SSI  Recent Labs    10/01/20 1659 10/01/20 2104 10/02/20 0743  GLUCAP 136* 116* 121*    Hypothyroidism: Continue Synthroid  Bipolar disorder/PTSD: Appears anxious-continue Klonopin-resume [place]  Chronic pain syndrome: Spinal cord stimulator in place-on Neurontin  OSA: Continue CPAP nightly  7 mm lingular nodule: Seen incidentally on CT scan-radiology recommending repeat CT in 6-12 months.  2 cm left adrenal gland nodule: Stable for outpatient work-up-including CT adrenal gland protocol at the discretion of primary care practitioner.  Morbid Obesity: Estimated body mass index is 38.6 kg/m as calculated from the following:   Height as of this encounter: '5\' 4"'$  (1.626 m).   Weight as of this encounter: 102 kg.    Diet: Diet Order             Diet Carb Modified Fluid consistency: Thin; Room service appropriate? Yes  Diet effective now                    Code Status: Full code   Family Communication: None at bedside   Disposition Plan: Status is: Observation  The patient will require care spanning > 2 midnights and should be moved to inpatient because: Inpatient level of care appropriate due to severity of illness  Dispo: The patient is from: Home              Anticipated d/c is to: Home              Patient currently  is not medically stable to d/c.   Difficult to place patient No    Barriers to Discharge: Seizures versus pseudoseizures-for LTM EEG today.  Antimicrobial agents: Anti-infectives (From admission, onward)    None        Time spent: 35 minutes-Greater than 50% of this time was spent in counseling, explanation of diagnosis, planning of further management, and coordination of  care.  MEDICATIONS: Scheduled Meds:  atorvastatin  40 mg Oral q1800   clonazepam  0.125 mg Oral TID   enoxaparin (LOVENOX) injection  40 mg Subcutaneous Q24H   furosemide  80 mg Oral BID   gabapentin  300 mg Oral BID   And   gabapentin  600 mg Oral QHS   insulin aspart  0-15 Units Subcutaneous TID WC   levothyroxine  224 mcg Oral QAC breakfast   loratadine  10 mg Oral QHS   metoprolol succinate  50 mg Oral BID   montelukast  10 mg Oral QHS   multivitamin with minerals  1 tablet Oral Daily   potassium chloride SA  40 mEq Oral BID   rOPINIRole  1 mg Oral TID   Continuous Infusions: PRN Meds:.acetaminophen **OR** acetaminophen, albuterol, butalbital-acetaminophen-caffeine, ondansetron **OR** ondansetron (ZOFRAN) IV, zolpidem   PHYSICAL EXAM: Vital signs: Vitals:   10/01/20 2240 10/01/20 2352 10/02/20 0333 10/02/20 0744  BP:  103/87 101/71 110/82  Pulse: 99 95 96 99  Resp: '16 16 15 16  '$ Temp:  (!) 97.5 F (36.4 C) 97.8 F (36.6 C) 98 F (36.7 C)  TempSrc:  Axillary Oral Oral  SpO2: 98% 97% 94% 95%  Weight:      Height:       Filed Weights   10/01/20 1840  Weight: 102 kg   Body mass index is 38.6 kg/m.   Gen Exam:Alert awake-not in any distress HEENT:atraumatic, normocephalic Chest: B/L clear to auscultation anteriorly CVS:S1S2 regular Abdomen:soft non tender, non distended Extremities:no edema Neurology: Non focal Skin: no rash  I have personally reviewed following labs and imaging studies  LABORATORY DATA: CBC: Recent Labs  Lab 09/30/20 1716 10/02/20 0622  WBC 8.7 8.4  NEUTROABS 6.1  --   HGB 11.5* 11.6*  HCT 34.7* 35.6*  MCV 88.5 87.9  PLT 263 [id number]    Basic Metabolic Panel: Recent Labs  Lab 09/30/20 1716 10/01/20 0902 10/02/20 0622  NA 135 137 139  K 3.0* 3.9 3.5  CL 97* 100 100  CO2 '29 25 28  '$ GLUCOSE 97 127* 106*  BUN '14 15 11  '$ CREATININE 0.89 0.60 0.69  CALCIUM 8.6* 9.2 9.6  MG 1.8 2.1 1.8    GFR: Estimated Creatinine Clearance:  99.9 mL/min (by C-G formula based on SCr of 0.69 mg/dL).  Liver Function Tests: Recent Labs  Lab 09/30/20 1716  AST 17  ALT 17  ALKPHOS 127*  BILITOT 0.6  PROT 7.8  ALBUMIN 3.8   No results for input(s): LIPASE, AMYLASE in the last 168 hours. No results for input(s): AMMONIA in the last 168 hours.  Coagulation Profile: Recent Labs  Lab 09/30/20 1716  INR 1.1    Cardiac Enzymes: No results for input(s): CKTOTAL, CKMB, CKMBINDEX, TROPONINI in the last 168 hours.  BNP (last 3 results) No results for input(s): PROBNP in the last 8760 hours.  Lipid Profile: No results for input(s): CHOL, HDL, LDLCALC, TRIG, CHOLHDL, LDLDIRECT in the last 72 hours.  Thyroid Function Tests: No results for input(s): TSH, T4TOTAL, FREET4, T3FREE, THYROIDAB in the last 72 hours.  Anemia  Panel: No results for input(s): VITAMINB12, FOLATE, FERRITIN, TIBC, IRON, RETICCTPCT in the last 72 hours.  Urine analysis:    Component Value Date/Time   COLORURINE YELLOW (A) 04/27/2020 2349   APPEARANCEUR CLEAR (A) 04/27/2020 2349   LABSPEC >1.046 (H) 04/27/2020 2349   PHURINE 5.0 04/27/2020 2349   GLUCOSEU NEGATIVE 04/27/2020 2349   HGBUR NEGATIVE 04/27/2020 2349   BILIRUBINUR NEGATIVE 04/27/2020 2349   KETONESUR NEGATIVE 04/27/2020 2349   PROTEINUR NEGATIVE 04/27/2020 2349   NITRITE NEGATIVE 04/27/2020 2349   LEUKOCYTESUR NEGATIVE 04/27/2020 2349    Sepsis Labs: Lactic Acid, Venous    Component Value Date/Time   LATICACIDVEN 0.7 03/29/2020 1950    MICROBIOLOGY: Recent Results (from the past 240 hour(s))  Surgical pcr screen     Status: Abnormal   Collection Time: 09/23/20 11:45 AM   Specimen: Nasal Mucosa; Nasal Swab  Result Value Ref Range Status   MRSA, PCR NEGATIVE NEGATIVE Final   Staphylococcus aureus POSITIVE (A) NEGATIVE Final    Comment: (NOTE) The Xpert SA Assay (FDA approved for NASAL specimens in patients 60 years of age and older), is one component of a  comprehensive surveillance program. It is not intended to diagnose infection nor to guide or monitor treatment. Performed at [facility], [place], [place] [address]   Resp Panel by RT-PCR (Flu A&B, Covid) Nasopharyngeal Swab     Status: None   Collection Time: 09/30/20  6:38 PM   Specimen: Nasopharyngeal Swab; Nasopharyngeal(NP) swabs in vial transport medium  Result Value Ref Range Status   SARS Coronavirus 2 by RT PCR NEGATIVE NEGATIVE Final    Comment: (NOTE) SARS-CoV-2 target nucleic acids are NOT DETECTED.  The SARS-CoV-2 RNA is generally detectable in upper respiratory specimens during the acute phase of infection. The lowest concentration of SARS-CoV-2 viral copies this assay can detect is 138 copies/mL. A negative result does not preclude SARS-Cov-2 infection and should not be used as the sole basis for treatment or other patient management decisions. A negative result may occur with  improper specimen collection/handling, submission of specimen other than nasopharyngeal swab, presence of viral mutation(s) within the areas targeted by this assay, and inadequate number of viral copies(<138 copies/mL). A negative result must be combined with clinical observations, patient history, and epidemiological information. The expected result is Negative.  Fact Sheet for Patients:  EntrepreneurPulse.com.au  Fact Sheet for Healthcare Providers:  IncredibleEmployment.be  This test is no t yet approved or cleared by the [place] FDA and  has been authorized for detection and/or diagnosis of SARS-CoV-2 by FDA under an Emergency Use Authorization (EUA). This EUA will remain  in effect (meaning this test can be used) for the duration of the COVID-19 declaration under Section 564(b)(1) of the Act, 21 U.S.C.section 360bbb-3(b)(1), unless the authorization is terminated  or revoked sooner.       Influenza A by PCR  NEGATIVE NEGATIVE Final   Influenza B by PCR NEGATIVE NEGATIVE Final    Comment: (NOTE) The Xpert Xpress SARS-CoV-2/FLU/RSV plus assay is intended as an aid in the diagnosis of influenza from Nasopharyngeal swab specimens and should not be used as a sole basis for treatment. Nasal washings and aspirates are unacceptable for Xpert Xpress SARS-CoV-2/FLU/RSV testing.  Fact Sheet for Patients: EntrepreneurPulse.com.au  Fact Sheet for Healthcare Providers: IncredibleEmployment.be  This test is not yet approved or cleared by the [place] FDA and has been authorized for detection and/or diagnosis of SARS-CoV-2 by FDA under an Emergency Use  Authorization (EUA). This EUA will remain in effect (meaning this test can be used) for the duration of the COVID-19 declaration under Section 564(b)(1) of the Act, 21 U.S.C. section 360bbb-3(b)(1), unless the authorization is terminated or revoked.  Performed at [facility], [address] [place], [place] [address]   MRSA Next Gen by PCR, Nasal     Status: None   Collection Time: 10/01/20  4:54 AM   Specimen: Nasal Mucosa; Nasal Swab  Result Value Ref Range Status   MRSA by PCR Next Gen NOT DETECTED NOT DETECTED Final    Comment: (NOTE) The GeneXpert MRSA Assay (FDA approved for NASAL specimens only), is one component of a comprehensive MRSA colonization surveillance program. It is not intended to diagnose MRSA infection nor to guide or monitor treatment for MRSA infections. Test performance is not FDA approved in patients less than 71 years old. Performed at [facility], [address] [place], [place] [address]     RADIOLOGY STUDIES/RESULTS: MR BRAIN W WO CONTRAST  Result Date: 10/01/2020 CLINICAL DATA:  Transient ischemic attack EXAM: MRI HEAD WITHOUT AND WITH CONTRAST TECHNIQUE: Multiplanar, multiecho pulse sequences of the brain and surrounding structures were obtained  without and with intravenous contrast. CONTRAST:  32m GADAVIST GADOBUTROL 1 MMOL/ML IV SOLN COMPARISON:  None. FINDINGS: Markedly motion degraded examination. Brain: No acute infarct, mass effect or extra-axial collection. No acute or chronic hemorrhage. Normal white matter signal, parenchymal volume and CSF spaces. The midline structures are normal. There is no abnormal contrast enhancement. Vascular: Major flow voids are preserved. Skull and upper cervical spine: Normal calvarium and skull base. Visualized upper cervical spine and soft tissues are normal. Sinuses/Orbits:No paranasal sinus fluid levels or advanced mucosal thickening. No mastoid or middle ear effusion. Normal orbits. IMPRESSION: Markedly motion degraded examination without acute intracranial abnormality. Electronically Signed   By: KUlyses JarredM.D.   On: 10/01/2020 02:46   EEG adult  Result Date: 09/30/2020 SDerek Jack MD     09/30/2020  7:48 PM Routine EEG Report MZykeria Gamelis a 50y.o. female with a history of spells who is undergoing an EEG to evaluate for seizures. Report: This EEG was acquired with electrodes placed according to the International 10-20 electrode system (including Fp1, Fp2, F3, F4, C3, C4, P3, P4, O1, O2, T3, T4, T5, T6, A1, A2, Fz, Cz, Pz). The following electrodes were missing or displaced: none. The occipital dominant rhythm was 8.5 Hz. This activity is reactive to stimulation. Drowsiness was manifested by background fragmentation; deeper stages of sleep were manifested by K complexes and sleep spindles. There was no focal slowing. There were no interictal epileptiform discharges. There were no electrographic seizures identified. There was no abnormal response to photic stimulation or hyperventilation. Impression: This EEG was obtained while awake and asleep and is normal.   Clinical Correlation: Normal EEGs, however, do not rule out epilepsy. CSu Monks MD [facility] [phone] 7pm- 7am,  please page neurology on call as listed in ABonny Doon   CT Angio Chest/Abd/Pel for Dissection W and/or W/WO  Result Date: 09/30/2020 CLINICAL DATA:  Abdominal pain. Code stroke, had ABD surgery in April when pt started to have a R sided facial droop and slurred speech. EXAM: CT ANGIOGRAPHY CHEST, ABDOMEN AND PELVIS TECHNIQUE: Non-contrast CT of the chest was initially obtained. Multidetector CT imaging through the chest, abdomen and pelvis was performed using the standard protocol during bolus administration of intravenous contrast. Multiplanar reconstructed images and MIPs were obtained and  reviewed to evaluate the vascular anatomy. CONTRAST:  135m OMNIPAQUE IOHEXOL 350 MG/ML SOLN COMPARISON:  CT abdomen pelvis 04/27/2020, CT abdomen pelvis 12/02/2019, CT abdomen pelvis 02/07/2019, CT abdomen pelvis 08/01/2017, CT abdomen pelvis 04/23/2015 FINDINGS: CTA CHEST FINDINGS Cardiovascular: Preferential opacification of the thoracic aorta. No evidence of thoracic aortic aneurysm or dissection. No atherosclerotic plaque of the thoracic aorta. No coronary artery calcifications.Normal heart size. No significant pericardial effusion. The main pulmonary artery is normal in caliber. No central pulmonary embolus. Mediastinum/Nodes: No enlarged mediastinal, hilar, or axillary lymph nodes. Thyroid gland, trachea, and esophagus demonstrate no significant findings. Lungs/Pleura: Similar-appearing foci of linear atelectasis versus scarring within the lingula with a slightly more rounded nodular-like region measuring 7 mm. No focal consolidation. No pulmonary nodule. No pulmonary mass. No pleural effusion. No pneumothorax. Musculoskeletal: No chest wall abnormality. No suspicious lytic or blastic osseous lesions. No acute displaced fracture. Multilevel degenerative changes of the spine. Review of the MIP images confirms the above findings. CTA ABDOMEN AND PELVIS FINDINGS VASCULAR Aorta: Normal caliber aorta without aneurysm,  dissection, vasculitis or significant stenosis. Celiac: Patent without evidence of aneurysm, dissection, vasculitis or significant stenosis. SMA: Patent without evidence of aneurysm, dissection, vasculitis or significant stenosis. Renals: Both renal arteries are patent without evidence of aneurysm, dissection, vasculitis, fibromuscular dysplasia or significant stenosis. IMA: Patent without evidence of aneurysm, dissection, vasculitis or significant stenosis. Inflow: Patent without evidence of aneurysm, dissection, vasculitis or significant stenosis. Veins: No obvious venous abnormality within the limitations of this arterial phase study. Review of the MIP images confirms the above findings. NON-VASCULAR Hepatobiliary: No focal liver abnormality. No gallstones, gallbladder wall thickening, or pericholecystic fluid. No biliary dilatation. Pancreas: No focal lesion. Normal pancreatic contour. No surrounding inflammatory changes. No main pancreatic ductal dilatation. Spleen: Normal in size without focal abnormality. Adrenals/Urinary Tract: Slight interval increase in size of a 2 cm (from 1.7 cm) left adrenal gland nodule with a density of 85 Hounsfield units. No right adrenal gland nodule. Bilateral kidneys enhance symmetrically. Bilateral renal excretion of previously administered intravenous contrast is symmetrical (recent CT angiography of the head and neck). No hydronephrosis. No hydroureter. The urinary bladder is unremarkable. There is no urothelial wall thickening and there are no filling defects in the opacified portions of the bilateral collecting systems or ureters nor of the urinary bladder. Stomach/Bowel: Query sigmoid surgical changes. Stomach is within normal limits. No evidence of bowel wall thickening or dilatation. The appendix is not definitely identified. Lymphatic: No lymphadenopathy. Reproductive: Status post hysterectomy. No adnexal masses. Other: No intraperitoneal free fluid. No intraperitoneal  free gas. No organized fluid collection. Musculoskeletal: Small fat containing supraumbilical ventral wall hernia (9:101) with an abdominal defect of 1.1 cm. And a supraumbilical ventral wall hernia containing a short loop of small bowel (9:99) with an abdominal defect of 2.5 cm. Neural stimulator within the subcutaneus soft tissues of back with leads entering the central canal at the L1-L2 level and terminating along the posterior central canal at the T7 through T10 levels. No suspicious lytic or blastic osseous lesions. No acute displaced fracture. Multilevel degenerative changes of the spine. Left hip degenerative changes. Review of the MIP images confirms the above findings. IMPRESSION: 1. No acute vascular abnormality. 2. Lingular atelectasis versus scarring with an associated 7 mm nodule. Initial follow-up with CT at 6-12 months is recommended to confirm persistence. If persistent, repeat CT is recommended every 2 years until 5 years of stability has been established. This recommendation follows the consensus statement: Guidelines for Management  of Incidental Pulmonary Nodules Detected on CT Images: From the Fleischner Society 2017; Radiology 2017; [phone]. 3. Two supraumbilical ventral wall hernias: One containing fat and other containing a short loop of small bowel with no findings of associated ischemia or bowel obstruction. 4. Slight interval increase in size of an indeterminate 2 cm left adrenal gland nodule. Recommend CT adrenal gland protocol further evaluation. Electronically Signed   By: Iven Finn M.D.   On: 09/30/2020 17:27   CT HEAD CODE STROKE WO CONTRAST  Result Date: 09/30/2020 CLINICAL DATA:  Code stroke. EXAM: CT HEAD WITHOUT CONTRAST TECHNIQUE: Contiguous axial images were obtained from the base of the skull through the vertex without intravenous contrast. COMPARISON:  09/29/2018. FINDINGS: Brain: Mild hypodensity in the right temporal lobe (series 4, image 9), which is favored  to be artifactual. No definite acute infarct, hemorrhage, hydrocephalus, extra-axial collection, mass, or mass effect. Vascular: No hyperdense vessel or unexpected calcification. Skull: Normal. Negative for fracture or focal lesion. Sinuses/Orbits: No acute finding. Other: None ASPECTS ([place] Stroke Program Early CT Score) - Ganglionic level infarction (caudate, lentiform nuclei, internal capsule, insula, M1-M3 cortex): 7 - Supraganglionic infarction (M4-M6 cortex): 3 Total score (0-10 with 10 being normal): 10 IMPRESSION: 1. Mild hypodensity in the right temporal lobe, which is favored to be artifactual. No definite acute infarct. No hemorrhage. 2. ASPECTS is 10 Code stroke imaging results were communicated on 09/30/2020 at 4:26 pm to provider Dr. Vladimir Crofts via telephone, who verbally acknowledged these results. Electronically Signed   By: Merilyn Baba M.D.   On: 09/30/2020 16:29   CT ANGIO HEAD NECK W WO CM W PERF (CODE STROKE)  Result Date: 09/30/2020 CLINICAL DATA:  Right-sided facial droop and slurred speech EXAM: CT ANGIOGRAPHY HEAD AND NECK CT PERFUSION BRAIN TECHNIQUE: Multidetector CT imaging of the head and neck was performed using the standard protocol during bolus administration of intravenous contrast. Multiplanar CT image reconstructions and MIPs were obtained to evaluate the vascular anatomy. Carotid stenosis measurements (when applicable) are obtained utilizing NASCET criteria, using the distal internal carotid diameter as the denominator. Multiphase CT imaging of the brain was performed following IV bolus contrast injection. Subsequent parametric perfusion maps were calculated using RAPID software. CONTRAST:  127m OMNIPAQUE IOHEXOL 350 MG/ML SOLN COMPARISON:  None. FINDINGS: Evaluation limited by poor contrast bolus timing. CTA NECK Aortic arch: Arch and great vessel origins are unremarkable. Right carotid system: Patent.  No stenosis. Left carotid system: Patent.  No stenosis. Vertebral  arteries: Patent. Proximal right vertebral artery is poorly visualized due to artifact. No definite stenosis. Skeleton: Degenerative changes at C5-C6. Other neck: Unremarkable. Upper chest: Included upper lungs are clear. Review of the MIP images confirms the above findings CTA HEAD Anterior circulation: Intracranial internal carotid arteries are patent. Proximal anterior and middle cerebral arteries appear patent at the circle-of-Willis. No definite M2 or A2 occlusion. Posterior circulation: Intracranial vertebral arteries, basilar artery, and proximal posterior cerebral arteries are patent. Venous sinuses: Patent as allowed by contrast bolus timing. Review of the MIP images confirms the above findings CT Brain Perfusion Findings: Degraded by motion. CBF (<30%) Volume: 023mPerfusion (Tmax>6.0s) volume: 1283mismatch Volume: 29m16mfarction Location: Calculated penumbra within both inferior frontal lobes. IMPRESSION: Suboptimal evaluation due to contrast bolus timing. No large vessel occlusion. No hemodynamically significant stenosis in the neck. Perfusion imaging demonstrates a calculated penumbra within both inferior frontal lobes of 12 mL. This is favored to be artifactual. These results were called by telephone at the time  of interpretation on 09/30/2020 at 4:49 pm to provider [facility] , who verbally acknowledged these results. Electronically Signed   By: Macy Mis M.D.   On: 09/30/2020 16:56     LOS: 1 day   Oren Binet, MD  [facility]    To contact the attending provider between 7A-7P or the covering provider during after hours 7P-7A, please log into the web site [email or web address] and access using universal [place] password for that web site. If you do not have the password, please call the hospital operator.  10/02/2020, 10:13 AM

## 2020-10-02 NOTE — Progress Notes (Signed)
EEG complete - results pending 

## 2020-10-02 NOTE — Procedures (Signed)
Routine EEG Report   Marie Clarke is a 50 y.o. female with a history of spells who is undergoing an EEG to evaluate for seizures.   Report: This EEG was acquired with electrodes placed according to the International 10-20 electrode system (including Fp1, Fp2, F3, F4, C3, C4, P3, P4, O1, O2, T3, T4, T5, T6, A1, A2, Fz, Cz, Pz). The following electrodes were missing or displaced: none.   The occipital dominant rhythm was 8.5 Hz. This activity is reactive to stimulation. Drowsiness was manifested by background fragmentation; deeper stages of sleep were not identified. There was no focal slowing. There were no interictal epileptiform discharges. There were no electrographic seizures identified. Photic stimulation and hyperventilation were not performed.   Impression: This EEG was obtained while awake and drowsy and is normal.    Clinical Correlation: Normal EEGs, however, do not rule out epilepsy.   Su Monks, MD [facility] [phone]   If 7pm- 7am, please page neurology on call as listed in [place].

## 2020-10-03 ENCOUNTER — Encounter: Admission: RE | Payer: Self-pay | Source: Home / Self Care

## 2020-10-03 ENCOUNTER — Ambulatory Visit: Admission: RE | Admit: 2020-10-03 | Payer: Medicare Other | Source: Home / Self Care | Admitting: Neurosurgery

## 2020-10-03 DIAGNOSIS — G4700 Insomnia, unspecified: Secondary | ICD-10-CM | POA: Diagnosis not present

## 2020-10-03 DIAGNOSIS — G4730 Sleep apnea, unspecified: Secondary | ICD-10-CM

## 2020-10-03 DIAGNOSIS — R569 Unspecified convulsions: Secondary | ICD-10-CM | POA: Diagnosis not present

## 2020-10-03 DIAGNOSIS — G894 Chronic pain syndrome: Secondary | ICD-10-CM | POA: Diagnosis not present

## 2020-10-03 DIAGNOSIS — F5102 Adjustment insomnia: Secondary | ICD-10-CM | POA: Diagnosis not present

## 2020-10-03 LAB — GLUCOSE, CAPILLARY
Glucose-Capillary: 129 mg/dL — ABNORMAL HIGH (ref 70–99)
Glucose-Capillary: 163 mg/dL — ABNORMAL HIGH (ref 70–99)
Glucose-Capillary: 147 mg/dL — ABNORMAL HIGH (ref 70–99)
Glucose-Capillary: 95 mg/dL (ref 70–99)
Glucose-Capillary: 111 mg/dL — ABNORMAL HIGH (ref 70–99)

## 2020-10-03 SURGERY — LUMBAR SPINAL CORD STIMULATOR REMOVAL
Anesthesia: General

## 2020-10-03 MED ORDER — CHLORHEXIDINE GLUCONATE 0.12 % MT SOLN: 15.0000 mL | Freq: Once | OROMUCOSAL | Status: DC

## 2020-10-03 MED ORDER — ORAL CARE MOUTH RINSE: 15.0000 mL | Freq: Once | OROMUCOSAL | Status: DC

## 2020-10-03 MED ORDER — HYDROXYZINE HCL 25 MG PO TABS
50.0000 mg | ORAL_TABLET | Freq: Every evening | ORAL | Status: DC | PRN
  Administered 2020-10-03: 50 mg via ORAL
  Filled 2020-10-03: qty 2

## 2020-10-03 MED ORDER — HYDROCODONE-ACETAMINOPHEN 5-325 MG PO TABS
1.0000 | ORAL_TABLET | Freq: Four times a day (QID) | ORAL | Status: DC | PRN
  Administered 2020-10-03 – 2020-10-04 (×3): 1 via ORAL
  Filled 2020-10-03 (×3): qty 1

## 2020-10-03 MED ORDER — LUMATEPERONE TOSYLATE 42 MG PO CAPS
42.0000 mg | ORAL_CAPSULE | Freq: Every day | ORAL | Status: DC
  Administered 2020-10-04: 42 mg via ORAL
  Filled 2020-10-03 (×2): qty 1

## 2020-10-03 MED ORDER — SODIUM CHLORIDE 0.9 % IV SOLN: INTRAVENOUS | Status: DC
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
## 2020-10-03 NOTE — Procedures (Addendum)
Patient Name: Marie Clarke  MRN: [id number]  Epilepsy Attending: Lora Havens  Referring Physician/Provider: Dr Jennette Kettle Duration: 10/02/2020 1924 to 10/03/2020 1124  Patient history: 50 y.o. female with PMH significant for significant for chronic pain, [place] on CPAP, CAD, chronic dCHF, DM2, HTN, hernia repair at [facility] who was seen at [facility] for R sided jerking and R sided weakness and is sent here of characterization of these episodes.  EEG to evaluate for seizures.  Level of alertness: Awake, asleep  AEDs during EEG study: Gabapentin  Technical aspects: This EEG study was done with scalp electrodes positioned according to the 10-20 International system of electrode placement. Electrical activity was acquired at a sampling rate of '500Hz'$  and reviewed with a high frequency filter of '70Hz'$  and a low frequency filter of '1Hz'$ . EEG data were recorded continuously and digitally stored.   Description: The posterior dominant rhythm consists of 8-'9Hz'$  activity of moderate voltage (25-35 uV) seen predominantly in posterior head regions, symmetric and reactive to eye opening and eye closing. Sleep was characterized by vertex waves, sleep spindles (12 to 14 Hz), maximal frontocentral region.  There is an excessive amount of 15 to 18 Hz beta activity with irregular morphology distributed symmetrically and diffusely.   Multiple patient events were recorded during which patient was noted to have side to side head movement, turning around in bed, intermittent nonrhythmic left and right upper extremity jerking.  Patient was able to answer questions during the episodes.  Concomitant EEG before, during and after the event showed normal posterior dominant rhythm.  Hyperventilation and photic stimulation were not performed.     ABNORMALITY - Excessive beta, generalized  IMPRESSION: This study is within normal limits. The excessive beta activity seen in the background is most likely due to the effect of benzodiazepine  and is a benign EEG pattern. No seizures or epileptiform discharges were seen throughout the recording.  Multiple patient events recorded during the study as described above without concomitant EEG change.  These episodes are NON epileptic.  Kaydan Wilhoite Barbra Sarks

## 2020-10-03 NOTE — Progress Notes (Signed)
Neurology Progress Note  Brief HPI: 50 y.o. female with PMHx of chronic pain, OSA on CPAP, CAD, chronic dCHF, DM2, and HTN who presented to [facility] on 8/19 for evaluation of R-sided jerking and R-sided weakness with a witnessed episode in the ED of R-sided twitching with aphasia. Head imaging without acute intracranial abnormality, routine EEG negative for seizures. She had 4 more witnessed episodes of unresponsiveness at [facility] with events aborted by Ativan, sternal rub, or provider exiting the room and was transferred to [facility] for spell characterization.   Subjective: No acute overnight events Patient reports "I think I had another mini stroke" and "I had a few episodes overnight" She also states "I don't know if the shaking is a nervous thing or not" and asks NP if the shaking can be related to her history of PTSD and bipolar disorder as she has been having flashbacks recently. RN at bedside who witnessed one of her shaking episodes and notified NP that she is alert and conscious throughout and is able to remember the events well despite patient initially reporting that she is unable to remember the episodes.  She becomes upset when she is told that she is no longer on Keppra because she states that "it really has helped my shaking". NP discussed with patient the risks of side effects with potentially unnecessary medications and the pending EEG results before further medication would be added.   Exam: Vitals:   10/03/20 0350 10/03/20 0801  BP: 111/76 102/63  Pulse: 82 98  Resp: 16 14  Temp: 98 F (36.7 C) 97.8 F (36.6 C)  SpO2: 100% 98%   Gen: Awake, laying comfortably in bed, in no acute distress Resp: non-labored breathing, no respiratory distress, on 1L [place] with SpO2 of 95% Abd: soft, non-tender, non-distended  Neuro: Mental Status: Awake, alert, and oriented to person, place, and situation. She incorrectly states that the month is June or July.  She is able to provide a clear and  coherent history of present illness.  Naming, repetition, comprehension, and fluency are intact.  There is no evidence of neglect noted.  Cranial Nerves: PERRL, visual fields are full, EOMI without ptosis or diplopia, hearing is intact to voice, phonation is normal, palate elevates symmetrically, shoulders shrug symmetrically. V: Patient reports decreased sensation to light touch on the right cheek VII: With smiling, patient elevates the right mouth more significantly than the left mouth seemingly functionally as cheeks puff symmetrically. She states "my right mouth feels tight" and states that it is "pulling" and that this has happened before with her mini strokes.  XII: Tongue protrudes rightward, also with functional appearing protrusion Motor: Left upper and lower extremities 5/5 strength without vertical drift.  Right lower extremity 5/5 strength without vertical drift.  Right upper extremity strength is variable throughout, initially with significant, functional-appearing vertical drift though on reassessment her strength is 5/5 without vertical drift.  Tone and bulk are normal Sensory: Reports decreased sensation to light touch with tingling on the right upper extremity with normal sensation to light touch on the left upper extremity. BLE with equal sensation to light touch bilaterally  DTR: 2+ and symmetric patellae, biceps, and brachioradialis Gait: Deferred  Pertinent Labs: CBC    Component Value Date/Time   WBC 8.4 10/02/2020 0622   RBC 4.05 10/02/2020 0622   HGB 11.6 (L) 10/02/2020 0622   HGB 12.3 02/22/2014 0049   HCT 35.6 (L) 10/02/2020 0622   HCT 38.1 02/22/2014 0049   PLT 263 10/02/2020  0622   PLT 223 02/22/2014 0049   MCV 87.9 10/02/2020 0622   MCV 90 02/22/2014 0049   MCH 28.6 10/02/2020 0622   MCHC 32.6 10/02/2020 0622   RDW 13.9 10/02/2020 0622   RDW 14.9 (H) 02/22/2014 0049   LYMPHSABS 1.9 09/30/2020 1716   MONOABS 0.7 09/30/2020 1716   EOSABS 0.0 09/30/2020  1716   BASOSABS 0.0 09/30/2020 1716   CMP     Component Value Date/Time   NA 139 10/02/2020 0622   NA 142 09/09/2017 1032   NA 141 02/22/2014 0049   K 3.5 10/02/2020 0622   K 2.9 (L) 02/22/2014 0049   CL 100 10/02/2020 0622   CL 111 (H) 02/22/2014 0049   CO2 28 10/02/2020 0622   CO2 19 (L) 02/22/2014 0049   GLUCOSE 106 (H) 10/02/2020 0622   GLUCOSE 101 (H) 02/22/2014 0049   BUN 11 10/02/2020 0622   BUN 13 09/09/2017 1032   BUN 25 (H) 02/22/2014 0049   CREATININE 0.69 10/02/2020 0622   CREATININE 1.13 02/22/2014 0049   CALCIUM 9.6 10/02/2020 0622   CALCIUM 8.7 02/22/2014 0049   PROT 7.8 09/30/2020 1716   PROT 7.3 09/09/2017 1032   ALBUMIN 3.8 09/30/2020 1716   ALBUMIN 4.6 09/09/2017 1032   AST 17 09/30/2020 1716   ALT 17 09/30/2020 1716   ALKPHOS 127 (H) 09/30/2020 1716   BILITOT 0.6 09/30/2020 1716   BILITOT 0.2 09/09/2017 1032   GFRNONAA >60 10/02/2020 0622   GFRNONAA 56 (L) 02/22/2014 0049   GFRAA >60 10/25/2019 1604   GFRAA >60 02/22/2014 0049   Urinalysis    Component Value Date/Time   COLORURINE YELLOW (A) 04/27/2020 2349   APPEARANCEUR CLEAR (A) 04/27/2020 2349   LABSPEC >1.046 (H) 04/27/2020 2349   PHURINE 5.0 04/27/2020 2349   GLUCOSEU NEGATIVE 04/27/2020 2349   HGBUR NEGATIVE 04/27/2020 2349   BILIRUBINUR NEGATIVE 04/27/2020 2349   KETONESUR NEGATIVE 04/27/2020 2349   PROTEINUR NEGATIVE 04/27/2020 2349   NITRITE NEGATIVE 04/27/2020 2349   LEUKOCYTESUR NEGATIVE 04/27/2020 2349   Drugs of Abuse     Component Value Date/Time   LABOPIA NONE DETECTED 04/02/2020 2330   COCAINSCRNUR NONE DETECTED 04/02/2020 2330   LABBENZ NONE DETECTED 04/02/2020 2330   AMPHETMU NONE DETECTED 04/02/2020 2330   THCU NONE DETECTED 04/02/2020 2330   LABBARB NONE DETECTED 04/02/2020 2330    Lab Results  Component Value Date   VITAMINB12 366 11/17/2018   Lab Results  Component Value Date   TSH 7.677 (H) 03/29/2020   Lab Results  Component Value Date   FOLATE 11.1  11/17/2018   Imaging Reviewed:  rEEG 09/30/2020:  No seizures or epileptiform discharges.  Overnight EEG 10/02/2020 - 10/03/2020: "This study is within normal limits. The excessive beta activity seen in the background is most likely due to the effect of benzodiazepine and is a benign EEG pattern. No seizures or epileptiform discharges were seen throughout the recording.   Multiple patient events recorded during the study as described above without concomitant EEG change.  These episodes are NON epileptic."  MRI brain WWO contrast 10/01/2020: Markedly motion degraded examination without acute intracranial abnormality.  Assessment: Marie Clarke is a 50 y.o. female with PMHx of chronic pain, OSA on CPAP, CAD, chronic dCHF, DM2, HTN, hernia repair at [facility] who was seen at [facility] for R sided jerking and R sided weakness and is sent here of characterization of these episodes. Her neurologic examination is notable for mild RUE weakness which appears  effort dependent with reports of right facial and RUE sensory deficits on examination 10/03/2020. 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 Curly Shores, and they are in agreement - No further neurology recommendations at this time. We will be available on an as needed basis for further questions or concerns  Anibal Henderson, AGACNP-BC [facility] [phone]

## 2020-10-03 NOTE — Progress Notes (Signed)
This chaplain responded to the consult requesting Pt. prayer.  The medical team is at the bedside during the chaplain visit.    The chaplain offered reflective listening and prayer.  The chaplain brought a Bible to the Pt. per the Pt. request.  The chaplain understands God is the Pt. constant companion.  This chaplain is available for F/U spiritual care as needed.

## 2020-10-03 NOTE — Progress Notes (Signed)
Nutrition Brief Note  Patient identified on the Malnutrition Screening Tool (MST) Report  Wt Readings from Last 15 Encounters:  10/01/20 102 kg  10/01/20 102.3 kg  09/23/20 101.5 kg  05/10/20 107.5 kg  04/27/20 102.1 kg  04/02/20 102.1 kg  03/26/20 102 kg  12/26/19 102 kg  12/02/19 102.1 kg  10/25/19 106.6 kg  10/22/19 106.6 kg  10/06/19 104.3 kg  09/12/19 (!) 108.9 kg  09/06/19 (!) 102.1 kg  07/22/19 102.1 kg   Pt admitted with seizure-like activity with pseudo-seizures.   Reviewed I/O's: -1.5 L x 24 hours  UOP: 2.3 L x 24 hours  Spoke with pt, who was frustrated that no one has come to help her after her bath. RD assisted pt in cleaning and dressing her in a new gown. Pt shares that she has a good appetite; she consumed all of her breakfast and 75% of her lunch. Per pt, she usually consumes 2 meals per day (Lunch of sandwich, Dinner of meat, starch, and vegetable). Pt also snacks on crackers and jello.   Pt denies any weight loss. Noted wt has been stable over the past 6 months.   RD discussed importance of good meal completion to promote healing. Pt expressed appreciation for visit.   Medications reviewed and include lasix and melatonin.   Nutrition-Focused physical exam completed. Findings are no fat depletion, no muscle depletion, and moderate edema.    Labs reviewed: CBGS: 101-163 (inpatient orders for glycemic control are 0-15 units insulin asapart TID with meals).    Body mass index is 38.6 kg/m. Patient meets criteria for obesity, class II based on current BMI. Obesity is a complex, chronic medical condition that is optimally managed by a multidisciplinary care team. Weight loss is not an ideal goal for an acute inpatient hospitalization. However, if further work-up for obesity is warranted, consider outpatient referral to outpatient bariatric service and/or [facility].    Current diet order is carb modified, patient is  consuming approximately 75-100% of meals at this time. Labs and medications reviewed.   No nutrition interventions warranted at this time. If nutrition issues arise, please consult RD.   Loistine Chance, RD, LDN, [place] Registered Dietitian II Certified Diabetes Care and Education Specialist Please refer to [facility] for RD and/or RD on-call/weekend/after hours pager

## 2020-10-03 NOTE — Progress Notes (Signed)
[facility] night shift telemetry coverage note.  The nursing staff requested the nocturnal hydroxyzine and was recommended to the patient by psychiatry earlier today.  Her most recent EKG has a normal QT/QTc.  Hydroxyzine 50 mg p.o. at bedtime as needed ordered.  Tennis Must, MD.

## 2020-10-03 NOTE — Progress Notes (Signed)
RN spoke with patient's aunt regarding he medication [place]. Her aunt will bring the medication on 8/23 in the morning to the nurses station on [address] so we can restart this.

## 2020-10-03 NOTE — Progress Notes (Signed)
RT NOTE:  Pt refuses CPAP tonight. Pt stated she wore oxygen via [place] last night and that was more comfortable than the CPAP. CPAP is setup and available at bedside. Pt understands RT is available if she changes her mind.

## 2020-10-03 NOTE — Progress Notes (Signed)
PROGRESS NOTE        PATIENT DETAILS Name: Marie Clarke Age: 50 y.o. Sex: female Date of Birth: [birth year] Admit Date: 10/01/2020 Admitting Physician Etta Quill, DO [id number], Rubbie Battiest, MD  Brief Narrative: Patient is a 50 y.o. 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  While at [facility] continued to have seizure-like episodes (with concern for possible psychogenic seizures)-she was transferred to [facility] for continuous EEG.  See below for further details.  Significant events: 8/19>> admit to [facility]-possible seizure-like activity with Todd's paresis/right facial droop. 8/20>> transfer to [facility] for continuous EEG monitoring  Significant studies: 819>> CT angio head/neck: No LVO-no significant stenosis in neck. 8/19>> CT angio chest/abdomen/pelvis: No acute vascular abnormality, 2 supraumbilical ventral wall hernias, 7 mm lung nodule, 2 cm left adrenal nodule 8/19>> spot EEG: no seizure-like activity 8/20>> MRI brain: No acute intracranial abnormality. 8/21-09/2020 >>LTM EEG: Multiple events recorded-without concomitant EEG changes-nonepileptic episodes.  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  Inquiring about changes to her psychiatric medications.  Assessment/Plan: Seizure-like activity-consistent with pseudoseizures: LTM EEG negative for seizure when she had seizure-like activity.  Per neurology-antiepileptics not indicated-no further work-up planned.  Patient has  longstanding history of depression/PTSD/bipolar disorder-patient inquiring about changes to her psychotropic medications-we will place inpatient consult to psychiatry.  Mobilize with nursing staff/rehab services and see how she does.  Chronic diastolic heart failure: Euvolemic-continue Lasix/Toprol  HTN: Stable-continue Toprol/Lasix  CAD: No anginal symptoms-suspect aspirin can be resumed.  COPD: Lungs clear-not in exacerbation-continue bronchodilators  DM-2 (A1c 6.5 on 8/20): CBG stable on SSI  Recent Labs    10/02/20 2047 10/03/20 0804 10/03/20 1243  GLUCAP 114* 163* 147*     Hypothyroidism: Continue Synthroid  Bipolar disorder/PTSD: Appears anxious-inquiring about changes to her medications-for now continue Klonopin-and Caplyta.  Have consulted psychiatry-we will await formal recommendations.  Chronic pain syndrome: Spinal cord stimulator in place-on Neurontin  OSA: Continue CPAP nightly  7 mm lingular nodule: Seen incidentally on CT scan-radiology recommending repeat CT in 6-12 months.  2 cm left adrenal gland nodule: Stable for outpatient work-up-including CT adrenal gland protocol at the discretion of primary care practitioner.  Morbid Obesity: Estimated body mass index is 38.6 kg/m as calculated from the following:   Height as of this encounter: '5\' 4"'$  (1.626 m).   Weight as of this encounter: 102 kg.    Diet: Diet Order             Diet Carb Modified Fluid consistency: Thin; Room service appropriate? Yes  Diet effective now                    Code Status: Full code   Family Communication: None at bedside   Disposition Plan: Status is: Observation  The patient will require care spanning > 2 midnights and should be moved to inpatient because: Inpatient level of care appropriate due to severity of illness  Dispo: The patient is from: Home  Anticipated d/c is to: Home              Patient currently is not medically stable to d/c.    Difficult to place patient No    Barriers to Discharge: Seizures versus pseudoseizures-for LTM EEG today.  Antimicrobial agents: Anti-infectives (From admission, onward)    None        Time spent: 25 minutes-Greater than 50% of this time was spent in counseling, explanation of diagnosis, planning of further management, and coordination of care.  MEDICATIONS: Scheduled Meds:  atorvastatin  40 mg Oral q1800   clonazepam  0.125 mg Oral TID   enoxaparin (LOVENOX) injection  40 mg Subcutaneous Q24H   furosemide  80 mg Oral BID   gabapentin  300 mg Oral BID   And   gabapentin  600 mg Oral QHS   insulin aspart  0-15 Units Subcutaneous TID WC   levothyroxine  224 mcg Oral QAC breakfast   loratadine  10 mg Oral QHS   lumateperone tosylate  42 mg Oral Daily   melatonin  5 mg Oral QHS   metoprolol succinate  50 mg Oral BID   montelukast  10 mg Oral QHS   multivitamin with minerals  1 tablet Oral Daily   potassium chloride SA  40 mEq Oral BID   rOPINIRole  1 mg Oral TID   Continuous Infusions: PRN Meds:.acetaminophen **OR** acetaminophen, albuterol, butalbital-acetaminophen-caffeine, ondansetron **OR** ondansetron (ZOFRAN) IV, zolpidem   PHYSICAL EXAM: Vital signs: Vitals:   10/03/20 0349 10/03/20 0350 10/03/20 0801 10/03/20 1245  BP:  111/76 102/63 107/75  Pulse:  82 98 99  Resp:  16 14   Temp:  98 F (36.7 C) 97.8 F (36.6 C) 98.4 F (36.9 C)  TempSrc:  Oral Oral Oral  SpO2: (!) 86% 100% 98% 98%  Weight:      Height:       Filed Weights   10/01/20 1840  Weight: 102 kg   Body mass index is 38.6 kg/m.   Gen Exam:Alert awake-not in any distress HEENT:atraumatic, normocephalic Chest: B/L clear to auscultation anteriorly CVS:S1S2 regular Abdomen:soft non tender, non distended Extremities:no edema Neurology: Non focal Skin: no rash   I have personally reviewed following labs and imaging studies  LABORATORY DATA: CBC: Recent Labs  Lab 09/30/20 1716  10/02/20 0622  WBC 8.7 8.4  NEUTROABS 6.1  --   HGB 11.5* 11.6*  HCT 34.7* 35.6*  MCV 88.5 87.9  PLT 263 263     Basic Metabolic Panel: Recent Labs  Lab 09/30/20 1716 10/01/20 0902 10/02/20 0622  NA 135 137 139  K 3.0* 3.9 3.5  CL 97* 100 100  CO2 '29 25 28  '$ GLUCOSE 97 127* 106*  BUN '14 15 11  '$ CREATININE 0.89 0.60 0.69  CALCIUM 8.6* 9.2 9.6  MG 1.8 2.1 1.8     GFR: Estimated Creatinine Clearance: 99.9 mL/min (by C-G formula based on SCr of 0.69 mg/dL).  Liver Function Tests: Recent Labs  Lab 09/30/20 1716  AST 17  ALT 17  ALKPHOS 127*  BILITOT 0.6  PROT 7.8  ALBUMIN 3.8    No results for input(s): LIPASE, AMYLASE in the last 168 hours. No results for input(s): AMMONIA in the last 168 hours.  Coagulation Profile: Recent Labs  Lab 09/30/20 1716  INR 1.1     Cardiac Enzymes: No results for input(s): CKTOTAL, CKMB, CKMBINDEX, TROPONINI in the last 168 hours.  BNP (last 3 results) No results for input(s): PROBNP in  the last 8760 hours.  Lipid Profile: No results for input(s): CHOL, HDL, LDLCALC, TRIG, CHOLHDL, LDLDIRECT in the last 72 hours.  Thyroid Function Tests: No results for input(s): TSH, T4TOTAL, FREET4, T3FREE, THYROIDAB in the last 72 hours.  Anemia Panel: No results for input(s): VITAMINB12, FOLATE, FERRITIN, TIBC, IRON, RETICCTPCT in the last 72 hours.  Urine analysis:    Component Value Date/Time   COLORURINE YELLOW (A) 04/27/2020 2349   APPEARANCEUR CLEAR (A) 04/27/2020 2349   LABSPEC >1.046 (H) 04/27/2020 2349   PHURINE 5.0 04/27/2020 2349   GLUCOSEU NEGATIVE 04/27/2020 2349   HGBUR NEGATIVE 04/27/2020 2349   BILIRUBINUR NEGATIVE 04/27/2020 2349   KETONESUR NEGATIVE 04/27/2020 2349   PROTEINUR NEGATIVE 04/27/2020 2349   NITRITE NEGATIVE 04/27/2020 2349   LEUKOCYTESUR NEGATIVE 04/27/2020 2349    Sepsis Labs: Lactic Acid, Venous    Component Value Date/Time   LATICACIDVEN 0.7 03/29/2020 1950    MICROBIOLOGY: Recent  Results (from the past 240 hour(s))  Resp Panel by RT-PCR (Flu A&B, Covid) Nasopharyngeal Swab     Status: None   Collection Time: 09/30/20  6:38 PM   Specimen: Nasopharyngeal Swab; Nasopharyngeal(NP) swabs in vial transport medium  Result Value Ref Range Status   SARS Coronavirus 2 by RT PCR NEGATIVE NEGATIVE Final    Comment: (NOTE) SARS-CoV-2 target nucleic acids are NOT DETECTED.  The SARS-CoV-2 RNA is generally detectable in upper respiratory specimens during the acute phase of infection. The lowest concentration of SARS-CoV-2 viral copies this assay can detect is 138 copies/mL. A negative result does not preclude SARS-Cov-2 infection and should not be used as the sole basis for treatment or other patient management decisions. A negative result may occur with  improper specimen collection/handling, submission of specimen other than nasopharyngeal swab, presence of viral mutation(s) within the areas targeted by this assay, and inadequate number of viral copies(<138 copies/mL). A negative result must be combined with clinical observations, patient history, and epidemiological information. The expected result is Negative.  Fact Sheet for Patients:  EntrepreneurPulse.com.au  Fact Sheet for Healthcare Providers:  IncredibleEmployment.be  This test is no t yet approved or cleared by the [place] FDA and  has been authorized for detection and/or diagnosis of SARS-CoV-2 by FDA under an Emergency Use Authorization (EUA). This EUA will remain  in effect (meaning this test can be used) for the duration of the COVID-19 declaration under Section 564(b)(1) of the Act, 21 U.S.C.section 360bbb-3(b)(1), unless the authorization is terminated  or revoked sooner.       Influenza A by PCR NEGATIVE NEGATIVE Final   Influenza B by PCR NEGATIVE NEGATIVE Final    Comment: (NOTE) The Xpert Xpress SARS-CoV-2/FLU/RSV plus assay is intended as an aid in the  diagnosis of influenza from Nasopharyngeal swab specimens and should not be used as a sole basis for treatment. Nasal washings and aspirates are unacceptable for Xpert Xpress SARS-CoV-2/FLU/RSV testing.  Fact Sheet for Patients: EntrepreneurPulse.com.au  Fact Sheet for Healthcare Providers: IncredibleEmployment.be  This test is not yet approved or cleared by the [place] FDA and has been authorized for detection and/or diagnosis of SARS-CoV-2 by FDA under an Emergency Use Authorization (EUA). This EUA will remain in effect (meaning this test can be used) for the duration of the COVID-19 declaration under Section 564(b)(1) of the Act, 21 U.S.C. section 360bbb-3(b)(1), unless the authorization is terminated or revoked.  Performed at [facility], [address], [place], [place] [address]   MRSA Next Gen by PCR, Nasal  Status: None   Collection Time: 10/01/20  4:54 AM   Specimen: Nasal Mucosa; Nasal Swab  Result Value Ref Range Status   MRSA by PCR Next Gen NOT DETECTED NOT DETECTED Final    Comment: (NOTE) The GeneXpert MRSA Assay (FDA approved for NASAL specimens only), is one component of a comprehensive MRSA colonization surveillance program. It is not intended to diagnose MRSA infection nor to guide or monitor treatment for MRSA infections. Test performance is not FDA approved in patients less than 39 years old. Performed at [facility], [address] [place], [place] [address]     RADIOLOGY STUDIES/RESULTS: EEG adult  Result Date: 10/02/2020 Derek Jack, MD     10/02/2020  7:50 PM Routine EEG Report  Raegin Haines is a 50 y.o. female with a history of spells who is undergoing an EEG to evaluate for seizures.  Report: This EEG was acquired with electrodes placed according to the International 10-20 electrode system (including Fp1, Fp2, F3, F4, C3, C4, P3, P4, O1, O2, T3, T4, T5, T6, A1, A2, Fz, Cz, Pz). The  following electrodes were missing or displaced: none.  The occipital dominant rhythm was 8.5 Hz. This activity is reactive to stimulation. Drowsiness was manifested by background fragmentation; deeper stages of sleep were not identified. There was no focal slowing. There were no interictal epileptiform discharges. There were no electrographic seizures identified. Photic stimulation and hyperventilation were not performed.  Impression: This EEG was obtained while awake and drowsy and is normal.   Clinical Correlation: Normal EEGs, however, do not rule out epilepsy.  Su Monks, MD [facility] [phone]  If 7pm- 7am, please page neurology on call as listed in [place].  Overnight EEG with video  Result Date: 10/03/2020 Lora Havens, MD     10/03/2020  1:13 PM Patient Name: Marie Clarke MRN: [id number] Epilepsy Attending: Lora Havens Referring Physician/Provider: Dr Jennette Kettle Duration: 10/02/2020 1924 to 10/03/2020 1124 Patient history: 50 y.o. female with PMH significant for significant for chronic pain, [place] on CPAP, CAD, chronic dCHF, DM2, HTN, hernia repair at [facility] who was seen at [facility] for R sided jerking and R sided weakness and is sent here of characterization of these episodes.  EEG to evaluate for seizures. Level of alertness: Awake, asleep AEDs during EEG study: Gabapentin Technical aspects: This EEG study was done with scalp electrodes positioned according to the 10-20 International system of electrode placement. Electrical activity was acquired at a sampling rate of '500Hz'$  and reviewed with a high frequency filter of '70Hz'$  and a low frequency filter of '1Hz'$ . EEG data were recorded continuously and digitally stored. Description: The posterior dominant rhythm consists of 8-'9Hz'$  activity of moderate voltage (25-35 uV) seen predominantly in posterior head regions, symmetric and reactive to eye opening and eye closing. Sleep was characterized by vertex waves, sleep spindles (12 to 14 Hz),  maximal frontocentral region.  There is an excessive amount of 15 to 18 Hz beta activity with irregular morphology distributed symmetrically and diffusely. Multiple patient events were recorded during which patient was noted to have side to side head movement, turning around in bed, intermittent nonrhythmic left and right upper extremity jerking.  Patient was able to answer questions during the episodes.  Concomitant EEG before, during and after the event showed normal posterior dominant rhythm. Hyperventilation and photic stimulation were not performed.   ABNORMALITY - Excessive beta, generalized IMPRESSION: This study is within normal limits. The excessive beta activity seen in the background is  most likely due to the effect of benzodiazepine and is a benign EEG pattern. No seizures or epileptiform discharges were seen throughout the recording. Multiple patient events recorded during the study as described above without concomitant EEG change.  These episodes are NON epileptic. Priyanka Barbra Sarks     LOS: 0 days   Oren Binet, MD  [facility]    To contact the attending provider between 7A-7P or the covering provider during after hours 7P-7A, please log into the web site [email or web address] and access using universal [place] password for that web site. If you do not have the password, please call the hospital operator.  10/03/2020, 1:34 PM

## 2020-10-03 NOTE — Progress Notes (Signed)
LTM EEG discontinued - no skin breakdown at unhook.   

## 2020-10-04 DIAGNOSIS — I10 Essential (primary) hypertension: Secondary | ICD-10-CM | POA: Diagnosis not present

## 2020-10-04 DIAGNOSIS — E039 Hypothyroidism, unspecified: Secondary | ICD-10-CM | POA: Diagnosis present

## 2020-10-04 DIAGNOSIS — I130 Hypertensive heart and chronic kidney disease with heart failure and stage 1 through stage 4 chronic kidney disease, or unspecified chronic kidney disease: Secondary | ICD-10-CM | POA: Diagnosis present

## 2020-10-04 DIAGNOSIS — J439 Emphysema, unspecified: Secondary | ICD-10-CM | POA: Diagnosis present

## 2020-10-04 DIAGNOSIS — E785 Hyperlipidemia, unspecified: Secondary | ICD-10-CM | POA: Diagnosis present

## 2020-10-04 DIAGNOSIS — E278 Other specified disorders of adrenal gland: Secondary | ICD-10-CM | POA: Diagnosis present

## 2020-10-04 DIAGNOSIS — G2581 Restless legs syndrome: Secondary | ICD-10-CM | POA: Diagnosis present

## 2020-10-04 DIAGNOSIS — F4310 Post-traumatic stress disorder, unspecified: Secondary | ICD-10-CM | POA: Diagnosis present

## 2020-10-04 DIAGNOSIS — E1122 Type 2 diabetes mellitus with diabetic chronic kidney disease: Secondary | ICD-10-CM | POA: Diagnosis present

## 2020-10-04 DIAGNOSIS — Z881 Allergy status to other antibiotic agents status: Secondary | ICD-10-CM | POA: Diagnosis not present

## 2020-10-04 DIAGNOSIS — G894 Chronic pain syndrome: Secondary | ICD-10-CM | POA: Diagnosis present

## 2020-10-04 DIAGNOSIS — Z6838 Body mass index (BMI) 38.0-38.9, adult: Secondary | ICD-10-CM | POA: Diagnosis not present

## 2020-10-04 DIAGNOSIS — F5102 Adjustment insomnia: Secondary | ICD-10-CM | POA: Diagnosis not present

## 2020-10-04 DIAGNOSIS — G8384 Todd's paralysis (postepileptic): Secondary | ICD-10-CM | POA: Diagnosis present

## 2020-10-04 DIAGNOSIS — G4700 Insomnia, unspecified: Secondary | ICD-10-CM | POA: Diagnosis present

## 2020-10-04 DIAGNOSIS — R569 Unspecified convulsions: Secondary | ICD-10-CM

## 2020-10-04 DIAGNOSIS — Z880 Allergy status to penicillin: Secondary | ICD-10-CM | POA: Diagnosis not present

## 2020-10-04 DIAGNOSIS — F319 Bipolar disorder, unspecified: Secondary | ICD-10-CM | POA: Diagnosis present

## 2020-10-04 DIAGNOSIS — Z9682 Presence of neurostimulator: Secondary | ICD-10-CM | POA: Diagnosis not present

## 2020-10-04 DIAGNOSIS — E1169 Type 2 diabetes mellitus with other specified complication: Secondary | ICD-10-CM | POA: Diagnosis present

## 2020-10-04 DIAGNOSIS — N183 Chronic kidney disease, stage 3 unspecified: Secondary | ICD-10-CM | POA: Diagnosis present

## 2020-10-04 DIAGNOSIS — I5032 Chronic diastolic (congestive) heart failure: Secondary | ICD-10-CM | POA: Diagnosis present

## 2020-10-04 DIAGNOSIS — G4733 Obstructive sleep apnea (adult) (pediatric): Secondary | ICD-10-CM | POA: Diagnosis present

## 2020-10-04 DIAGNOSIS — I2510 Atherosclerotic heart disease of native coronary artery without angina pectoris: Secondary | ICD-10-CM | POA: Diagnosis present

## 2020-10-04 DIAGNOSIS — R4701 Aphasia: Secondary | ICD-10-CM | POA: Diagnosis present

## 2020-10-04 LAB — GLUCOSE, CAPILLARY: Glucose-Capillary: 99 mg/dL (ref 70–99)

## 2020-10-04 MED ORDER — HYDROXYZINE HCL 25 MG PO TABS: 25.0000 mg | ORAL_TABLET | Freq: Two times a day (BID) | ORAL | Status: DC | PRN

## 2020-10-04 MED ORDER — HYDROXYZINE HCL 50 MG PO TABS: 50.0000 mg | ORAL_TABLET | Freq: Every day | ORAL | 0 refills | Status: DC

## 2020-10-04 MED ORDER — HYDROXYZINE HCL 25 MG PO TABS: 25.0000 mg | ORAL_TABLET | Freq: Two times a day (BID) | ORAL | 0 refills | Status: DC | PRN

## 2020-10-04 MED ORDER — HYDROXYZINE HCL 25 MG PO TABS: 50.0000 mg | ORAL_TABLET | Freq: Every day | ORAL | Status: DC

## 2020-10-04 NOTE — Discharge Summary (Signed)
PATIENT DETAILS Name: Marie Clarke Age: 50 y.o. Sex: female Date of Birth: 1970/03/04 MRN: [id number]. Admitting Physician: Marie Osgood, MD [id number], Marie Battiest, MD  Admit Date: 10/01/2020 Discharge date: 10/04/2020  Recommendations for Outpatient Follow-up:  Follow up with PCP in 1-2 weeks Please obtain CMP/CBC in one week Please ensure follow up with psychiatry Has lung/adrenal nodule-see below  Admitted From:  Home  Disposition: [name]: No  Equipment/Devices: None  Discharge Condition: Stable  CODE STATUS: FULL CODE  Diet recommendation:  Diet Order             Diet - low sodium heart healthy           Diet Carb Modified Fluid consistency: Thin; Room service appropriate? Yes  Diet effective now                    Brief Narrative: Patient is a 50 y.o. 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  While at [facility] continued to have seizure-like episodes (with concern for possible psychogenic seizures)-she was transferred to [facility] for continuous EEG.  See below for further details.   Significant events: 8/19>> admit to [facility]-possible seizure-like activity with Todd's paresis/right facial droop. 8/20>> transfer to [facility] for continuous EEG monitoring   Significant studies: 819>> CT angio head/neck: No LVO-no significant stenosis in neck. 8/19>> CT angio chest/abdomen/pelvis: No acute vascular abnormality, 2 supraumbilical ventral wall hernias, 7 mm lung nodule, 2 cm left adrenal nodule 8/19>> spot EEG: no seizure-like activity 8/20>> MRI brain: No acute intracranial abnormality. 8/21-09/2020 >>LTM EEG: Multiple events recorded-without concomitant EEG changes-nonepileptic episodes.   Antimicrobial  therapy: None   Microbiology data: 8/19>> influenza/COVID PCR: Negative   Procedures : None   Consults: Neurology,psychiatry  Brief Hospital Course: Seizure-like activity-consistent with pseudoseizures: LTM EEG negative for seizure when she had seizure-like activity.  Per neurology-antiepileptics not indicated-no further work-up planned.  Patient has longstanding history of depression/PTSD/bipolar disorder-subsequently evaluated by psychiatry-added Hydroxyzine prn for anxiety and scheduled for insomnia. Her mood seems stable-spoke with Psych resident this am-ok for discharge   Chronic diastolic heart failure: Euvolemic-continue Lasix/Toprol   HTN: Stable-continue Toprol/Lasix   CAD: No anginal symptoms-suspect aspirin can be resumed.   COPD: Lungs clear-not in exacerbation-continue bronchodilators   DM-2 (A1c 6.5 on 8/20): CBG stable on SSI  Hypothyroidism: Continue Synthroid   Bipolar disorder/PTSD: Appears anxious-inquiring about changes to her medications-for now continue Klonopin-and Caplyta.  Have consulted psychiatry-we will await formal recommendations.   Chronic pain syndrome: Spinal cord stimulator in place-on Neurontin   OSA: Continue CPAP nightly   7 mm lingular nodule: Seen incidentally on CT scan-radiology recommending repeat CT in 6-12 months.This finding and recommendation was discussed with patient   2 cm left adrenal gland nodule: Stable for outpatient work-up-including CT adrenal gland protocol at the discretion of primary care practitioner.This finding and recommendation was discussed with patient   Morbid Obesity: Estimated body mass index is 38.6 kg/m as calculated from the following:   Height as of this encounter: '5\' 4"'$  (1.626 m).   Weight as of this encounter: 102 kg.      Procedures None  Discharge Diagnoses:  Principal Problem:   Seizure-like activity ([name]) Active Problems:   Essential (primary) hypertension   Type 2 diabetes mellitus  with  hyperlipidemia (HCC)   Chronic pain syndrome   History of psychiatric symptoms   Insomnia with sleep apnea   Insomnia due to psychological stress   Seizure [name]   Discharge Instructions:  Activity:  As tolerated    Discharge Instructions     Diet - low sodium heart healthy   Complete by: As directed    Discharge instructions   Complete by: As directed    Follow with Primary MD  Marie Peach, MD in 1-2 weeks  Please get a complete blood count and chemistry panel checked by your Primary MD at your next visit, and again as instructed by your Primary MD.  Get Medicines reviewed and adjusted: Please take all your medications with you for your next visit with your Primary MD  Laboratory/radiological data: Please request your Primary MD to go over all hospital tests and procedure/radiological results at the follow up, please ask your Primary MD to get all Hospital records sent to his/her office.  In some cases, they will be blood work, cultures and biopsy results pending at the time of your discharge. Please request that your primary care M.D. follows up on these results.  Also Note the following: If you experience worsening of your admission symptoms, develop shortness of breath, life threatening emergency, suicidal or homicidal thoughts you must seek medical attention immediately by calling 911 or calling your MD immediately  if symptoms less severe.  You must read complete instructions/literature along with all the possible adverse reactions/side effects for all the Medicines you take and that have been prescribed to you. Take any new Medicines after you have completely understood and accpet all the possible adverse reactions/side effects.   Do not drive when taking Pain medications or sleeping medications (Benzodaizepines)  Do not take more than prescribed Pain, Sleep and Anxiety Medications. It is not advisable to combine anxiety,sleep and pain medications without talking  with your primary care practitioner  Special Instructions: If you have smoked or chewed Tobacco  in the last 2 yrs please stop smoking, stop any regular Alcohol  and or any Recreational drug use.  Wear Seat belts while driving.  Please note: You were cared for by a hospitalist during your hospital stay. Once you are discharged, your primary care physician will handle any further medical issues. Please note that NO REFILLS for any discharge medications will be authorized once you are discharged, as it is imperative that you return to your primary care physician (or establish a relationship with a primary care physician if you do not have one) for your post hospital discharge needs so that they can reassess your need for medications and monitor your lab values.   1.) No driving until cleared by your outpatient physicians  You were found to have 7 mm lingular nodule (lung nodule): Seen incidentally on CT scan-radiology recommending repeat CT in 6-12 months.Please talk with your Primary Care practitioner about this finding at your next visit. In some cases these nodules can turn cancerous.   You were found to have 2 cm left adrenal gland nodule. Please talk with your Primary Care practitioner about this finding at your next visit. In some cases these nodules can turn cancerous.   Increase activity slowly   Complete by: As directed       Allergies as of 10/04/2020       Reactions   Diazepam Hives, Nausea And Vomiting   Ziprasidone Hcl Itching, Other (See Comments)   Confusion   Azithromycin Hives  Divalproex Sodium Hives   Levofloxacin Hives, Rash   1/31: would like to retry since not taking valium   Lisinopril Other (See Comments)   Medicine cause kidney failure   Metronidazole Hives   1/31: would like to retry since she is no longer taking valium   Sulfa Antibiotics Hives   Valproic Acid Itching, Rash   Cephalexin Hives   Ciprofloxacin Hives   Doxycycline Rash   Penicillins Hives    Has patient had a PCN reaction causing immediate rash, facial/tongue/throat swelling, SOB or lightheadedness with hypotension: No Has patient had a PCN reaction causing severe rash involving mucus membranes or skin necrosis: No Has patient had a PCN reaction that required hospitalization: No Has patient had a PCN reaction occurring within the last 10 years: No If all of the above answers are "NO", then may proceed with Cephalosporin use.        Medication List     STOP taking these medications    levETIRAcetam 500 MG/100ML Soln Commonly known as: KEPRRA   levocetirizine 5 MG tablet Commonly known as: XYZAL       TAKE these medications    aspirin EC 81 MG tablet Take 81 mg by mouth daily. Swallow whole.   atorvastatin 40 MG tablet Commonly known as: LIPITOR Take 40 mg by mouth at bedtime.   Caplyta 42 MG capsule Generic drug: lumateperone tosylate Take 42 mg by mouth at bedtime.   cholecalciferol 25 MCG (1000 UNIT) tablet Commonly known as: VITAMIN D3 Take 1,000 Units by mouth daily.   clonazepam 0.125 MG disintegrating tablet Commonly known as: KLONOPIN Take 0.125 mg by mouth 3 (three) times daily.   cyanocobalamin 1000 MCG tablet Take 1,000 mcg by mouth daily.   famotidine 20 MG tablet Commonly known as: PEPCID Take 40 mg by mouth at bedtime.   furosemide 80 MG tablet Commonly known as: LASIX Take 80 mg by mouth 2 (two) times daily.   gabapentin 300 MG capsule Commonly known as: NEURONTIN Take 300-600 mg by mouth See admin instructions. Take 300 mg in the morning and noon and 600 mg at bedtime   hydrOXYzine 50 MG tablet Commonly known as: ATARAX/VISTARIL Take 1 tablet (50 mg total) by mouth at bedtime.   hydrOXYzine 25 MG tablet Commonly known as: ATARAX/VISTARIL Take 1 tablet (25 mg total) by mouth 2 (two) times daily as needed for anxiety.   levothyroxine 112 MCG tablet Commonly known as: SYNTHROID Take 224 mcg by mouth daily before breakfast.    metoprolol succinate 50 MG 24 hr tablet Commonly known as: TOPROL-XL Take 50 mg by mouth 2 (two) times daily.   montelukast 10 MG tablet Commonly known as: SINGULAIR Take 10 mg by mouth at bedtime.   multivitamin tablet Take 1 tablet by mouth daily.   nitroGLYCERIN 0.4 MG SL tablet Commonly known as: NITROSTAT Place 0.4 mg under the tongue every 5 (five) minutes as needed for chest pain.   omeprazole 40 MG capsule Commonly known as: PRILOSEC Take 40 mg by mouth 2 (two) times daily.   potassium chloride SA 20 MEQ tablet Commonly known as: KLOR-CON Take 40 mEq by mouth 2 (two) times daily.   ProAir HFA 108 (90 Base) MCG/ACT inhaler Generic drug: albuterol Inhale 2 puffs into the lungs every 6 (six) hours as needed for wheezing or shortness of breath.   rOPINIRole 1 MG tablet Commonly known as: REQUIP Take 1 mg by mouth 3 (three) times daily.   Symbicort 160-4.5 MCG/ACT inhaler Generic  drug: budesonide-formoterol Inhale 2 puffs into the lungs 2 (two) times daily.   vitamin C 500 MG tablet Commonly known as: ASCORBIC ACID Take 500 mg by mouth daily.   vitamin E 180 MG (400 UNITS) capsule Take 400 Units by mouth daily.        Follow-up Information     Marie Peach, MD. Schedule an appointment as soon as possible for a visit in 1 week(s).   Specialty: Family Medicine Contact information: [place] [address] [phone]         Ander Slade, PA-C Follow up in 1 week(s).   Specialty: Physician Assistant Contact information: [place] [id number] [phone]                Allergies  Allergen Reactions   Diazepam Hives and Nausea And Vomiting   Ziprasidone Hcl Itching and Other (See Comments)    Confusion    Azithromycin Hives   Divalproex Sodium Hives   Levofloxacin Hives and Rash    1/31: would like to retry since not taking valium   Lisinopril Other (See Comments)    Medicine cause kidney  failure   Metronidazole Hives    1/31: would like to retry since she is no longer taking valium   Sulfa Antibiotics Hives   Valproic Acid Itching and Rash   Cephalexin Hives   Ciprofloxacin Hives   Doxycycline Rash   Penicillins Hives    Has patient had a PCN reaction causing immediate rash, facial/tongue/throat swelling, SOB or lightheadedness with hypotension: No Has patient had a PCN reaction causing severe rash involving mucus membranes or skin necrosis: No Has patient had a PCN reaction that required hospitalization: No Has patient had a PCN reaction occurring within the last 10 years: No If all of the above answers are "NO", then may proceed with Cephalosporin use.       Consultations:  neurology and psychiatry    Other Procedures/Studies: MR BRAIN W WO CONTRAST  Result Date: 10/01/2020 CLINICAL DATA:  Transient ischemic attack EXAM: MRI HEAD WITHOUT AND WITH CONTRAST TECHNIQUE: Multiplanar, multiecho pulse sequences of the brain and surrounding structures were obtained without and with intravenous contrast. CONTRAST:  71m GADAVIST GADOBUTROL 1 MMOL/ML IV SOLN COMPARISON:  None. FINDINGS: Markedly motion degraded examination. Brain: No acute infarct, mass effect or extra-axial collection. No acute or chronic hemorrhage. Normal white matter signal, parenchymal volume and CSF spaces. The midline structures are normal. There is no abnormal contrast enhancement. Vascular: Major flow voids are preserved. Skull and upper cervical spine: Normal calvarium and skull base. Visualized upper cervical spine and soft tissues are normal. Sinuses/Orbits:No paranasal sinus fluid levels or advanced mucosal thickening. No mastoid or middle ear effusion. Normal orbits. IMPRESSION: Markedly motion degraded examination without acute intracranial abnormality. Electronically Signed   By: KUlyses JarredM.D.   On: 10/01/2020 02:46   EEG adult  Result Date: 10/02/2020 SDerek Jack MD     10/02/2020   7:50 PM Routine EEG Report  MKimberlynn Blandonis a 50y.o. female with a history of spells who is undergoing an EEG to evaluate for seizures.  Report: This EEG was acquired with electrodes placed according to the International 10-20 electrode system (including Fp1, Fp2, F3, F4, C3, C4, P3, P4, O1, O2, T3, T4, T5, T6, A1, A2, Fz, Cz, Pz). The following electrodes were missing or displaced: none.  The occipital dominant rhythm was 8.5 Hz. This activity is reactive to stimulation. Drowsiness was  manifested by background fragmentation; deeper stages of sleep were not identified. There was no focal slowing. There were no interictal epileptiform discharges. There were no electrographic seizures identified. Photic stimulation and hyperventilation were not performed.  Impression: This EEG was obtained while awake and drowsy and is normal.   Clinical Correlation: Normal EEGs, however, do not rule out epilepsy.  Su Monks, MD [facility] [phone]  If 7pm- 7am, please page neurology on call as listed in [place].  EEG adult  Result Date: 09/30/2020 Derek Jack, MD     09/30/2020  7:48 PM Routine EEG Report Marie Clarke is a 50 y.o. female with a history of spells who is undergoing an EEG to evaluate for seizures. Report: This EEG was acquired with electrodes placed according to the International 10-20 electrode system (including Fp1, Fp2, F3, F4, C3, C4, P3, P4, O1, O2, T3, T4, T5, T6, A1, A2, Fz, Cz, Pz). The following electrodes were missing or displaced: none. The occipital dominant rhythm was 8.5 Hz. This activity is reactive to stimulation. Drowsiness was manifested by background fragmentation; deeper stages of sleep were manifested by K complexes and sleep spindles. There was no focal slowing. There were no interictal epileptiform discharges. There were no electrographic seizures identified. There was no abnormal response to photic stimulation or hyperventilation. Impression: This EEG was obtained while  awake and asleep and is normal.   Clinical Correlation: Normal EEGs, however, do not rule out epilepsy. Su Monks, MD [facility] [phone] If 7pm- 7am, please page neurology on call as listed in [place].   Overnight EEG with video  Result Date: 10/03/2020 Lora Havens, MD     10/03/2020  1:13 PM Patient Name: Marie Clarke MRN: [id number] Epilepsy Attending: Lora Havens Referring Physician/Provider: Dr Jennette Kettle Duration: 10/02/2020 1924 to 10/03/2020 1124 Patient history: 50 y.o. female with PMH significant for significant for chronic pain, [place] on CPAP, CAD, chronic dCHF, DM2, HTN, hernia repair at [facility] who was seen at [facility] for R sided jerking and R sided weakness and is sent here of characterization of these episodes.  EEG to evaluate for seizures. Level of alertness: Awake, asleep AEDs during EEG study: Gabapentin Technical aspects: This EEG study was done with scalp electrodes positioned according to the 10-20 International system of electrode placement. Electrical activity was acquired at a sampling rate of '500Hz'$  and reviewed with a high frequency filter of '70Hz'$  and a low frequency filter of '1Hz'$ . EEG data were recorded continuously and digitally stored. Description: The posterior dominant rhythm consists of 8-'9Hz'$  activity of moderate voltage (25-35 uV) seen predominantly in posterior head regions, symmetric and reactive to eye opening and eye closing. Sleep was characterized by vertex waves, sleep spindles (12 to 14 Hz), maximal frontocentral region.  There is an excessive amount of 15 to 18 Hz beta activity with irregular morphology distributed symmetrically and diffusely. Multiple patient events were recorded during which patient was noted to have side to side head movement, turning around in bed, intermittent nonrhythmic left and right upper extremity jerking.  Patient was able to answer questions during the episodes.  Concomitant EEG before, during and after the event showed  normal posterior dominant rhythm. Hyperventilation and photic stimulation were not performed.   ABNORMALITY - Excessive beta, generalized IMPRESSION: This study is within normal limits. The excessive beta activity seen in the background is most likely due to the effect of benzodiazepine and is a benign EEG pattern. No seizures or epileptiform discharges were seen throughout the recording.  Multiple patient events recorded during the study as described above without concomitant EEG change.  These episodes are NON epileptic. Lora Havens   CT Angio Chest/Abd/Pel for Dissection W and/or W/WO  Result Date: 09/30/2020 CLINICAL DATA:  Abdominal pain. Code stroke, had ABD surgery in April when pt started to have a R sided facial droop and slurred speech. EXAM: CT ANGIOGRAPHY CHEST, ABDOMEN AND PELVIS TECHNIQUE: Non-contrast CT of the chest was initially obtained. Multidetector CT imaging through the chest, abdomen and pelvis was performed using the standard protocol during bolus administration of intravenous contrast. Multiplanar reconstructed images and MIPs were obtained and reviewed to evaluate the vascular anatomy. CONTRAST:  112m OMNIPAQUE IOHEXOL 350 MG/ML SOLN COMPARISON:  CT abdomen pelvis 04/27/2020, CT abdomen pelvis 12/02/2019, CT abdomen pelvis 02/07/2019, CT abdomen pelvis 08/01/2017, CT abdomen pelvis 04/23/2015 FINDINGS: CTA CHEST FINDINGS Cardiovascular: Preferential opacification of the thoracic aorta. No evidence of thoracic aortic aneurysm or dissection. No atherosclerotic plaque of the thoracic aorta. No coronary artery calcifications.Normal heart size. No significant pericardial effusion. The main pulmonary artery is normal in caliber. No central pulmonary embolus. Mediastinum/Nodes: No enlarged mediastinal, hilar, or axillary lymph nodes. Thyroid gland, trachea, and esophagus demonstrate no significant findings. Lungs/Pleura: Similar-appearing foci of linear atelectasis versus scarring within  the lingula with a slightly more rounded nodular-like region measuring 7 mm. No focal consolidation. No pulmonary nodule. No pulmonary mass. No pleural effusion. No pneumothorax. Musculoskeletal: No chest wall abnormality. No suspicious lytic or blastic osseous lesions. No acute displaced fracture. Multilevel degenerative changes of the spine. Review of the MIP images confirms the above findings. CTA ABDOMEN AND PELVIS FINDINGS VASCULAR Aorta: Normal caliber aorta without aneurysm, dissection, vasculitis or significant stenosis. Celiac: Patent without evidence of aneurysm, dissection, vasculitis or significant stenosis. SMA: Patent without evidence of aneurysm, dissection, vasculitis or significant stenosis. Renals: Both renal arteries are patent without evidence of aneurysm, dissection, vasculitis, fibromuscular dysplasia or significant stenosis. IMA: Patent without evidence of aneurysm, dissection, vasculitis or significant stenosis. Inflow: Patent without evidence of aneurysm, dissection, vasculitis or significant stenosis. Veins: No obvious venous abnormality within the limitations of this arterial phase study. Review of the MIP images confirms the above findings. NON-VASCULAR Hepatobiliary: No focal liver abnormality. No gallstones, gallbladder wall thickening, or pericholecystic fluid. No biliary dilatation. Pancreas: No focal lesion. Normal pancreatic contour. No surrounding inflammatory changes. No main pancreatic ductal dilatation. Spleen: Normal in size without focal abnormality. Adrenals/Urinary Tract: Slight interval increase in size of a 2 cm (from 1.7 cm) left adrenal gland nodule with a density of 85 Hounsfield units. No right adrenal gland nodule. Bilateral kidneys enhance symmetrically. Bilateral renal excretion of previously administered intravenous contrast is symmetrical (recent CT angiography of the head and neck). No hydronephrosis. No hydroureter. The urinary bladder is unremarkable. There is  no urothelial wall thickening and there are no filling defects in the opacified portions of the bilateral collecting systems or ureters nor of the urinary bladder. Stomach/Bowel: Query sigmoid surgical changes. Stomach is within normal limits. No evidence of bowel wall thickening or dilatation. The appendix is not definitely identified. Lymphatic: No lymphadenopathy. Reproductive: Status post hysterectomy. No adnexal masses. Other: No intraperitoneal free fluid. No intraperitoneal free gas. No organized fluid collection. Musculoskeletal: Small fat containing supraumbilical ventral wall hernia (9:101) with an abdominal defect of 1.1 cm. And a supraumbilical ventral wall hernia containing a short loop of small bowel (9:99) with an abdominal defect of 2.5 cm. Neural stimulator within the subcutaneus soft tissues of back with  leads entering the central canal at the L1-L2 level and terminating along the posterior central canal at the T7 through T10 levels. No suspicious lytic or blastic osseous lesions. No acute displaced fracture. Multilevel degenerative changes of the spine. Left hip degenerative changes. Review of the MIP images confirms the above findings. IMPRESSION: 1. No acute vascular abnormality. 2. Lingular atelectasis versus scarring with an associated 7 mm nodule. Initial follow-up with CT at 6-12 months is recommended to confirm persistence. If persistent, repeat CT is recommended every 2 years until 5 years of stability has been established. This recommendation follows the consensus statement: Guidelines for Management of Incidental Pulmonary Nodules Detected on CT Images: From the Fleischner Society 2017; Radiology 2017; 284:228-243. 3. Two supraumbilical ventral wall hernias: One containing fat and other containing a short loop of small bowel with no findings of associated ischemia or bowel obstruction. 4. Slight interval increase in size of an indeterminate 2 cm left adrenal gland nodule. Recommend CT  adrenal gland protocol further evaluation. Electronically Signed   By: Iven Finn M.D.   On: 09/30/2020 17:27   CT HEAD CODE STROKE WO CONTRAST  Result Date: 09/30/2020 CLINICAL DATA:  Code stroke. EXAM: CT HEAD WITHOUT CONTRAST TECHNIQUE: Contiguous axial images were obtained from the base of the skull through the vertex without intravenous contrast. COMPARISON:  09/29/2018. FINDINGS: Brain: Mild hypodensity in the right temporal lobe (series 4, image 9), which is favored to be artifactual. No definite acute infarct, hemorrhage, hydrocephalus, extra-axial collection, mass, or mass effect. Vascular: No hyperdense vessel or unexpected calcification. Skull: Normal. Negative for fracture or focal lesion. Sinuses/Orbits: No acute finding. Other: None ASPECTS ([place] Stroke Program Early CT Score) - Ganglionic level infarction (caudate, lentiform nuclei, internal capsule, insula, M1-M3 cortex): 7 - Supraganglionic infarction (M4-M6 cortex): 3 Total score (0-10 with 10 being normal): 10 IMPRESSION: 1. Mild hypodensity in the right temporal lobe, which is favored to be artifactual. No definite acute infarct. No hemorrhage. 2. ASPECTS is 10 Code stroke imaging results were communicated on 09/30/2020 at 4:26 pm to provider Dr. Vladimir Crofts via telephone, who verbally acknowledged these results. Electronically Signed   By: Merilyn Baba M.D.   On: 09/30/2020 16:29   CT ANGIO HEAD NECK W WO CM W PERF (CODE STROKE)  Result Date: 09/30/2020 CLINICAL DATA:  Right-sided facial droop and slurred speech EXAM: CT ANGIOGRAPHY HEAD AND NECK CT PERFUSION BRAIN TECHNIQUE: Multidetector CT imaging of the head and neck was performed using the standard protocol during bolus administration of intravenous contrast. Multiplanar CT image reconstructions and MIPs were obtained to evaluate the vascular anatomy. Carotid stenosis measurements (when applicable) are obtained utilizing NASCET criteria, using the distal internal carotid  diameter as the denominator. Multiphase CT imaging of the brain was performed following IV bolus contrast injection. Subsequent parametric perfusion maps were calculated using RAPID software. CONTRAST:  153m OMNIPAQUE IOHEXOL 350 MG/ML SOLN COMPARISON:  None. FINDINGS: Evaluation limited by poor contrast bolus timing. CTA NECK Aortic arch: Arch and great vessel origins are unremarkable. Right carotid system: Patent.  No stenosis. Left carotid system: Patent.  No stenosis. Vertebral arteries: Patent. Proximal right vertebral artery is poorly visualized due to artifact. No definite stenosis. Skeleton: Degenerative changes at C5-C6. Other neck: Unremarkable. Upper chest: Included upper lungs are clear. Review of the MIP images confirms the above findings CTA HEAD Anterior circulation: Intracranial internal carotid arteries are patent. Proximal anterior and middle cerebral arteries appear patent at the circle-of-Willis. No definite M2 or A2 occlusion.  Posterior circulation: Intracranial vertebral arteries, basilar artery, and proximal posterior cerebral arteries are patent. Venous sinuses: Patent as allowed by contrast bolus timing. Review of the MIP images confirms the above findings CT Brain Perfusion Findings: Degraded by motion. CBF (<30%) Volume: 36m Perfusion (Tmax>6.0s) volume: 172mMismatch Volume: 1210mnfarction Location: Calculated penumbra within both inferior frontal lobes. IMPRESSION: Suboptimal evaluation due to contrast bolus timing. No large vessel occlusion. No hemodynamically significant stenosis in the neck. Perfusion imaging demonstrates a calculated penumbra within both inferior frontal lobes of 12 mL. This is favored to be artifactual. These results were called by telephone at the time of interpretation on 09/30/2020 at 4:49 pm to provider [facility] Centerwho verbally acknowledged these results. Electronically Signed   By: PraMacy MisD.   On: 09/30/2020 16:56     TODAY-DAY OF  DISCHARGE:  Subjective:   [facility],no chest abdominal pain,no new weakness tingling or numbness, feels much better wants to go home today.  Objective:   Blood pressure 107/67, pulse (!) 108, temperature 98 F (36.7 C), temperature source Oral, resp. rate 19, height '5\' 4"'$  (1.626 m), weight 102 kg, SpO2 99 %.  Intake/Output Summary (Last 24 hours) at 10/04/2020 0926 Last data filed at 10/04/2020 0900 Gross per 24 hour  Intake 1330 ml  Output 2050 ml  Net -720 ml   Filed Weights   10/01/20 1840  Weight: 102 kg    Exam: Awake Alert, Oriented *3, No new F.N deficits, Normal affect [place].AT,PERRAL Supple Neck,No JVD, No cervical lymphadenopathy appriciated.  Symmetrical Chest wall movement, Good air movement bilaterally, CTAB RRR,No Gallops,Rubs or new Murmurs, No Parasternal Heave +ve B.Sounds, Abd Soft, Non tender, No organomegaly appriciated, No rebound -guarding or rigidity. No Cyanosis, Clubbing or edema, No new Rash or bruise   PERTINENT RADIOLOGIC STUDIES: EEG adult  Result Date: 8/208/23/22aDerek JackD     8/208/23/22:50 PM Routine EEG Report  Marie Clarke a [age]o. female with a history of spells who is undergoing an EEG to evaluate for seizures.  Report: This EEG was acquired with electrodes placed according to the International 10-20 electrode system (including Fp1, Fp2, F3, F4, C3, C4, P3, P4, O1, O2, T3, T4, T5, T6, A1, A2, Fz, Cz, Pz). The following electrodes were missing or displaced: none.  The occipital dominant rhythm was 8.5 Hz. This activity is reactive to stimulation. Drowsiness was manifested by background fragmentation; deeper stages of sleep were not identified. There was no focal slowing. There were no interictal epileptiform discharges. There were no electrographic seizures identified. Photic stimulation and hyperventilation were not performed.  Impression: This EEG was obtained while awake and drowsy and is normal.   Clinical  Correlation: Normal EEGs, however, do not rule out epilepsy.  ColSu MonksD [facility] [phone] 7pm- 7am, please page neurology on call as listed in AMIHelena Besser SideOvernight EEG with video  Result Date: 10/03/2020 YadLora HavensD     10/03/2020  1:13 PM Patient Name: MarDarnice OsakiN: [id number] Attending: PriLora Havensferring Physician/Provider: Dr JarJennette Kettleration: 8/208/23/202224 to 10/03/2020 1124 Patient history: [age]o. female with PMH significant for significant for chronic pain, [place] on CPAP, CAD, chronic dCHF, DM2, HTN[facility] R sided jerking and R sided weakness and is sent here of characterization of these episodes.  EEG to evaluate for seizures. Level of alertness: Awake,  asleep AEDs during EEG study: Gabapentin Technical aspects: This EEG study was done with scalp electrodes positioned according to the 10-20 International system of electrode placement. Electrical activity was acquired at a sampling rate of '500Hz'$  and reviewed with a high frequency filter of '70Hz'$  and a low frequency filter of '1Hz'$ . EEG data were recorded continuously and digitally stored. Description: The posterior dominant rhythm consists of 8-'9Hz'$  activity of moderate voltage (25-35 uV) seen predominantly in posterior head regions, symmetric and reactive to eye opening and eye closing. Sleep was characterized by vertex waves, sleep spindles (12 to 14 Hz), maximal frontocentral region.  There is an excessive amount of 15 to 18 Hz beta activity with irregular morphology distributed symmetrically and diffusely. Multiple patient events were recorded during which patient was noted to have side to side head movement, turning around in bed, intermittent nonrhythmic left and right upper extremity jerking.  Patient was able to answer questions during the episodes.  Concomitant EEG before, during and after the event showed normal posterior dominant rhythm.  Hyperventilation and photic stimulation were not performed.   ABNORMALITY - Excessive beta, generalized IMPRESSION: This study is within normal limits. The excessive beta activity seen in the background is most likely due to the effect of benzodiazepine and is a benign EEG pattern. No seizures or epileptiform discharges were seen throughout the recording. Multiple patient events recorded during the study as described above without concomitant EEG change.  These episodes are NON epileptic. Priyanka Barbra Sarks     PERTINENT LAB RESULTS: CBC: Recent Labs    10/02/20 0622  WBC 8.4  HGB 11.6*  HCT 35.6*  PLT 263   CMET CMP     Component Value Date/Time   NA 139 10/02/2020 0622   NA 142 09/09/2017 1032   NA 141 02/22/2014 0049   K 3.5 10/02/2020 0622   K 2.9 (L) 02/22/2014 0049   CL 100 10/02/2020 0622   CL 111 (H) 02/22/2014 0049   CO2 28 10/02/2020 0622   CO2 19 (L) 02/22/2014 0049   GLUCOSE 106 (H) 10/02/2020 0622   GLUCOSE 101 (H) 02/22/2014 0049   BUN 11 10/02/2020 0622   BUN 13 09/09/2017 1032   BUN 25 (H) 02/22/2014 0049   CREATININE 0.69 10/02/2020 0622   CREATININE 1.13 02/22/2014 0049   CALCIUM 9.6 10/02/2020 0622   CALCIUM 8.7 02/22/2014 0049   PROT 7.8 09/30/2020 1716   PROT 7.3 09/09/2017 1032   ALBUMIN 3.8 09/30/2020 1716   ALBUMIN 4.6 09/09/2017 1032   AST 17 09/30/2020 1716   ALT 17 09/30/2020 1716   ALKPHOS 127 (H) 09/30/2020 1716   BILITOT 0.6 09/30/2020 1716   BILITOT 0.2 09/09/2017 1032   GFRNONAA >60 10/02/2020 0622   GFRNONAA 56 (L) 02/22/2014 0049   GFRAA >60 10/25/2019 1604   GFRAA >60 02/22/2014 0049    GFR Estimated Creatinine Clearance: 99.9 mL/min (by C-G formula based on SCr of 0.69 mg/dL). No results for input(s): LIPASE, AMYLASE in the last 72 hours. No results for input(s): CKTOTAL, CKMB, CKMBINDEX, TROPONINI in the last 72 hours. Invalid input(s): POCBNP No results for input(s): DDIMER in the last 72 hours. Recent Labs    10/01/20 2053   HGBA1C 6.5*   No results for input(s): CHOL, HDL, LDLCALC, TRIG, CHOLHDL, LDLDIRECT in the last 72 hours. No results for input(s): TSH, T4TOTAL, T3FREE, THYROIDAB in the last 72 hours.  Invalid input(s): FREET3 No results for input(s): VITAMINB12, FOLATE, FERRITIN, TIBC, IRON, RETICCTPCT in the last  72 hours. Coags: No results for input(s): INR in the last 72 hours.  Invalid input(s): PT Microbiology: Recent Results (from the past 240 hour(s))  Resp Panel by RT-PCR (Flu A&B, Covid) Nasopharyngeal Swab     Status: None   Collection Time: 09/30/20  6:38 PM   Specimen: Nasopharyngeal Swab; Nasopharyngeal(NP) swabs in vial transport medium  Result Value Ref Range Status   SARS Coronavirus 2 by RT PCR NEGATIVE NEGATIVE Final    Comment: (NOTE) SARS-CoV-2 target nucleic acids are NOT DETECTED.  The SARS-CoV-2 RNA is generally detectable in upper respiratory specimens during the acute phase of infection. The lowest concentration of SARS-CoV-2 viral copies this assay can detect is 138 copies/mL. A negative result does not preclude SARS-Cov-2 infection and should not be used as the sole basis for treatment or other patient management decisions. A negative result may occur with  improper specimen collection/handling, submission of specimen other than nasopharyngeal swab, presence of viral mutation(s) within the areas targeted by this assay, and inadequate number of viral copies(<138 copies/mL). A negative result must be combined with clinical observations, patient history, and epidemiological information. The expected result is Negative.  Fact Sheet for Patients:  EntrepreneurPulse.com.au  Fact Sheet for Healthcare Providers:  IncredibleEmployment.be  This test is no t yet approved or cleared by the [place] FDA and  has been authorized for detection and/or diagnosis of SARS-CoV-2 by FDA under an Emergency Use Authorization (EUA). This EUA  will remain  in effect (meaning this test can be used) for the duration of the COVID-19 declaration under Section 564(b)(1) of the Act, 21 U.S.C.section 360bbb-3(b)(1), unless the authorization is terminated  or revoked sooner.       Influenza A by PCR NEGATIVE NEGATIVE Final   Influenza B by PCR NEGATIVE NEGATIVE Final    Comment: (NOTE) The Xpert Xpress SARS-CoV-2/FLU/RSV plus assay is intended as an aid in the diagnosis of influenza from Nasopharyngeal swab specimens and should not be used as a sole basis for treatment. Nasal washings and aspirates are unacceptable for Xpert Xpress SARS-CoV-2/FLU/RSV testing.  Fact Sheet for Patients: EntrepreneurPulse.com.au  Fact Sheet for Healthcare Providers: IncredibleEmployment.be  This test is not yet approved or cleared by the [place] FDA and has been authorized for detection and/or diagnosis of SARS-CoV-2 by FDA under an Emergency Use Authorization (EUA). This EUA will remain in effect (meaning this test can be used) for the duration of the COVID-19 declaration under Section 564(b)(1) of the Act, 21 U.S.C. section 360bbb-3(b)(1), unless the authorization is terminated or revoked.  Performed at [facility], [address] [place], [place] [address]   MRSA Next Gen by PCR, Nasal     Status: None   Collection Time: 10/01/20  4:54 AM   Specimen: Nasal Mucosa; Nasal Swab  Result Value Ref Range Status   MRSA by PCR Next Gen NOT DETECTED NOT DETECTED Final    Comment: (NOTE) The GeneXpert MRSA Assay (FDA approved for NASAL specimens only), is one component of a comprehensive MRSA colonization surveillance program. It is not intended to diagnose MRSA infection nor to guide or monitor treatment for MRSA infections. Test performance is not FDA approved in patients less than 67 years old. Performed at [facility], [address] [place], [place] [address]     FURTHER  DISCHARGE INSTRUCTIONS:  Get Medicines reviewed and adjusted: Please take all your medications with you for your next visit with your Primary MD  Laboratory/radiological data: Please request your Primary MD  to go over all hospital tests and procedure/radiological results at the follow up, please ask your Primary MD to get all Hospital records sent to his/her office.  In some cases, they will be blood work, cultures and biopsy results pending at the time of your discharge. Please request that your primary care M.D. goes through all the records of your hospital data and follows up on these results.  Also Note the following: If you experience worsening of your admission symptoms, develop shortness of breath, life threatening emergency, suicidal or homicidal thoughts you must seek medical attention immediately by calling 911 or calling your MD immediately  if symptoms less severe.  You must read complete instructions/literature along with all the possible adverse reactions/side effects for all the Medicines you take and that have been prescribed to you. Take any new Medicines after you have completely understood and accpet all the possible adverse reactions/side effects.   Do not drive when taking Pain medications or sleeping medications (Benzodaizepines)  Do not take more than prescribed Pain, Sleep and Anxiety Medications. It is not advisable to combine anxiety,sleep and pain medications without talking with your primary care practitioner  Special Instructions: If you have smoked or chewed Tobacco  in the last 2 yrs please stop smoking, stop any regular Alcohol  and or any Recreational drug use.  Wear Seat belts while driving.  Please note: You were cared for by a hospitalist during your hospital stay. Once you are discharged, your primary care physician will handle any further medical issues. Please note that NO REFILLS for any discharge medications will be authorized once you are discharged,  as it is imperative that you return to your primary care physician (or establish a relationship with a primary care physician if you do not have one) for your post hospital discharge needs so that they can reassess your need for medications and monitor your lab values.  Total Time spent coordinating discharge including counseling, education and face to face time equals 35 minutes.  SignedOren Binet 10/04/2020 9:26 AM

## 2020-10-04 NOTE — Evaluation (Signed)
Physical Therapy Evaluation and Discharge Patient Details Name: Marie Clarke MRN: [id number] DOB: [birth year] Today's Date: 10/04/2020   History of Present Illness  50 y.o. female presented to the ED at [facility] on 09/30/20 for evaluation of right facial droop and right-sided weakness. 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 All education has been completed and the patient has no further questions.  See below for any follow-up Physical Therapy or equipment needs. PT is signing off. Thank you for this referral.     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 During PT evaluation used ace wrap)      Mobility  Bed Mobility               General bed mobility comments: up in chair on arrival; denies difficulty    Transfers Overall transfer level: Needs assistance Equipment used: Rolling walker (2 wheeled) Transfers: Sit to/from Stand Sit to Stand: Supervision         General transfer comment: vc for hand placement with RW for incr safety  Ambulation/Gait Ambulation/Gait assistance: Modified independent (Device/Increase time) Gait Distance (Feet): 30 Feet Assistive device: Rolling walker (2 wheeled) Gait  Pattern/deviations: Step-to pattern;Decreased stride length;Antalgic     General Gait Details: slightly antalgic due to left foot/ankle pain; step-to pattern leading with left foot  Stairs            Wheelchair Mobility    Modified Rankin (Stroke Patients Only)       Balance Overall balance assessment: Modified Independent                                           Pertinent Vitals/Pain Pain Assessment: Faces Faces Pain Scale: Hurts little more Pain Location: left ankle Pain Descriptors / Indicators: Aching Pain Intervention(s): Limited activity within patient's tolerance;Monitored during session;Other (comment) (applied ace wrap)    Home Living Family/patient expects to be discharged to:: Private residence Living Arrangements: Other relatives Available Help at Discharge: Family Type of Home: House Home Access: Stairs to enter   [employer] of Steps: [place]: One level Home Equipment: Environmental consultant - 2 wheels;Shower seat      Prior Function Level of Independence: Independent               Hand Dominance   Dominant Hand: Right    Extremity/Trunk Assessment   Upper Extremity Assessment Upper Extremity Assessment: Overall WFL for tasks assessed    Lower Extremity Assessment Lower Extremity Assessment: Overall WFL for tasks assessed    Cervical / Trunk Assessment Cervical / Trunk Assessment: Other exceptions Cervical / Trunk Exceptions: overweight  Communication   Communication: No difficulties  Cognition Arousal/Alertness: Awake/alert Behavior During Therapy: WFL for tasks assessed/performed Overall Cognitive  Status: Within Functional Limits for tasks assessed                                        General Comments      Exercises     Assessment/Plan    PT Assessment Patent does not need any further PT services  PT Problem List         PT Treatment Interventions      PT Goals (Current  goals can be found in the Care Plan section)  Acute Rehab PT Goals Patient Stated Goal: go home today PT Goal Formulation: All assessment and education complete, DC therapy    Frequency     Barriers to discharge        Co-evaluation               AM-PAC PT "6 Clicks" Mobility  Outcome Measure Help needed turning from your back to your side while in a flat bed without using bedrails?: None Help needed moving from lying on your back to sitting on the side of a flat bed without using bedrails?: None Help needed moving to and from a bed to a chair (including a wheelchair)?: A Little Help needed standing up from a chair using your arms (e.g., wheelchair or bedside chair)?: A Little Help needed to walk in hospital room?: None Help needed climbing 3-5 steps with a railing? : A Little 6 Click Score: 21    End of Session   Activity Tolerance: Patient tolerated treatment well Patient left: in chair;with call bell/phone within reach   PT Visit Diagnosis: Pain Pain - Right/Left: Left Pain - part of body: Ankle and joints of foot    Time: [id number] PT Time Calculation (min) (ACUTE ONLY): 15 min   Charges:   PT Evaluation $PT Eval Low Complexity: 1 Low           Arby Barrette, PT Pager [phone]   Rexanne Mano 10/04/2020, 9:42 AM

## 2020-10-04 NOTE — Progress Notes (Signed)
Orthopedic Tech Progress Note Patient Details:  Marie Clarke 1970/06/01 [id number]  Ortho Devices Type of Ortho Device: ASO Ortho Device/Splint Location: LLE Ortho Device/Splint Interventions: Ordered, Application, Adjustment   Post Interventions Patient Tolerated: Well Instructions Provided: [name] 10/04/2020, 10:46 AM

## 2020-10-04 NOTE — Progress Notes (Signed)
Pt was discharged with ankle brace. Taken out to the car via w/c accompanied by volunteer.

## 2020-10-04 NOTE — Final Consult Note (Signed)
Psychiatry C/L Final Sign-Off Note    Assessment/Final recommendations  Marie Clarke is a 50 y.o. female followed by me for medication management.    Wound care (if applicable):    Diet at discharge: per primary team   Activity at discharge: per primary team   Follow-up appointment:  Continue to follow with Ander Slade at [facility] for medication management. Will include resources for therapy in discharge summary; please call to schedule a video appointment. Therapy not available at [facility] currently.   Pending results:  Unresulted Labs (From admission, onward)    None        Medication recommendations: Continue Caplyta 42 mg, Hydroxyzine 25 mg up to twice daily as needed for anxiety and Hydroxyzine 50 mg at bedtime for insomnia, and Ambien 5 mg as needed at bedtime for insomnia. Decrease Klonopin 0.125 mg to twice daily for 1 week, then once daily for 1 week before discontinuing use.    Other recommendations:    Thank you for allowing [place] to participate in the care of your patient!  Please consult [place] again if you have further needs for your patient.  Rosezetta Schlatter 10/04/2020 9:17 AM    Subjective  Marie Clarke is a 50 year old female with a psychiatric history of bipolar 1 disorder, PTSD, intellectual disability, psychogenic seizures, and a suicide attempt via OD (2015 after parents' deaths), and a medical history of chronic pain, CKD, T2DM, CHF, and COPD admitted for continuous EEG and consulted to the psychiatric service for medication management. This morning, she is in a much better mood, as she was able to sleep through the night without any flashbacks nor seizure-like activity. She says that 50 mg Hydroxyzine worked so well for her that she did not need Zolpidem. She denies SI/HI/AVH as well as medication side effects. Patient is determined to be psychiatrically stable at this time.    Objective  Vital signs in last 24 hours: Temp:  [97.7 F (36.5 C)-98.6 F  (37 C)] 98 F (36.7 C) (08/23 0753) Pulse Rate:  [87-108] 108 (08/23 0840) Resp:  [19-20] 19 (08/23 0753) BP: (99-107)/(61-84) 107/67 (08/23 0840) SpO2:  [94 %-99 %] 99 % (08/23 0753)  General: Psychiatric Specialty Exam:   Presentation  General Appearance: Appropriate for Environment   Eye Contact:Good   Speech:Clear and Coherent   Speech Volume:Normal   Handedness: No data recorded   Mood and Affect  Mood:Euthymic   Affect:Congruent; Appropriate     Thought Process  Thought Processes:Coherent; Goal Directed; Linear   Descriptions of Associations:Intact   Orientation:Full (Time, Place and Person)   Thought Content:WDL   History of Schizophrenia/Schizoaffective disorder:No  Duration of Psychotic Symptoms: >6 months Hallucinations:Hallucinations: None    Ideas of Reference:None   Suicidal Thoughts:Suicidal Thoughts: No   Homicidal Thoughts:Homicidal Thoughts: No     Sensorium  Memory:Immediate Good; Recent Good; Remote Good   Judgment:Good   Insight:Fair     Executive Functions  Concentration:Good   Attention Span:Good   Recall:Good   Fund of Knowledge:Fair   Language:Fair     Psychomotor Activity  Psychomotor Activity:Psychomotor Activity: Normal     Assets  Assets:Desire for Improvement; Armed forces logistics/support/administrative officer; Housing; Resilience; Social Support     Sleep  Sleep:Sleep: Good     Physical Exam: Physical Exam Vitals reviewed.  Constitutional:      Appearance: She is obese.  HENT:     Head: Normocephalic and atraumatic.  Eyes:     Extraocular Movements: Extraocular movements intact.  Cardiovascular:  Rate and Rhythm: Normal rate.  Pulmonary:     Effort: Pulmonary effort is normal.  Musculoskeletal:        General: Normal range of motion.  Neurological:     Mental Status: She is alert and oriented to person, place, and time.  Psychiatric:        Mood and Affect: Mood normal.        Behavior: Behavior normal.     Pertinent labs and Studies: Recent Labs    10/02/20 0622  WBC 8.4  HGB 11.6*  HCT 35.6*   BMET Recent Labs    10/02/20 0622  NA 139  K 3.5  CL 100  CO2 28  GLUCOSE 106*  BUN 11  CREATININE 0.69  CALCIUM 9.6   No results for input(s): LABURIN in the last 72 hours. Results for orders placed or performed during the hospital encounter of 09/30/20  Resp Panel by RT-PCR (Flu A&B, Covid) Nasopharyngeal Swab     Status: None   Collection Time: 09/30/20  6:38 PM   Specimen: Nasopharyngeal Swab; Nasopharyngeal(NP) swabs in vial transport medium  Result Value Ref Range Status   SARS Coronavirus 2 by RT PCR NEGATIVE NEGATIVE Final    Comment: (NOTE) SARS-CoV-2 target nucleic acids are NOT DETECTED.  The SARS-CoV-2 RNA is generally detectable in upper respiratory specimens during the acute phase of infection. The lowest concentration of SARS-CoV-2 viral copies this assay can detect is 138 copies/mL. A negative result does not preclude SARS-Cov-2 infection and should not be used as the sole basis for treatment or other patient management decisions. A negative result may occur with  improper specimen collection/handling, submission of specimen other than nasopharyngeal swab, presence of viral mutation(s) within the areas targeted by this assay, and inadequate number of viral copies(<138 copies/mL). A negative result must be combined with clinical observations, patient history, and epidemiological information. The expected result is Negative.  Fact Sheet for Patients:  EntrepreneurPulse.com.au  Fact Sheet for Healthcare Providers:  IncredibleEmployment.be  This test is no t yet approved or cleared by the [place] FDA and  has been authorized for detection and/or diagnosis of SARS-CoV-2 by FDA under an Emergency Use Authorization (EUA). This EUA will remain  in effect (meaning this test can be used) for the duration of the COVID-19  declaration under Section 564(b)(1) of the Act, 21 U.S.C.section 360bbb-3(b)(1), unless the authorization is terminated  or revoked sooner.       Influenza A by PCR NEGATIVE NEGATIVE Final   Influenza B by PCR NEGATIVE NEGATIVE Final    Comment: (NOTE) The Xpert Xpress SARS-CoV-2/FLU/RSV plus assay is intended as an aid in the diagnosis of influenza from Nasopharyngeal swab specimens and should not be used as a sole basis for treatment. Nasal washings and aspirates are unacceptable for Xpert Xpress SARS-CoV-2/FLU/RSV testing.  Fact Sheet for Patients: EntrepreneurPulse.com.au  Fact Sheet for Healthcare Providers: IncredibleEmployment.be  This test is not yet approved or cleared by the [place] FDA and has been authorized for detection and/or diagnosis of SARS-CoV-2 by FDA under an Emergency Use Authorization (EUA). This EUA will remain in effect (meaning this test can be used) for the duration of the COVID-19 declaration under Section 564(b)(1) of the Act, 21 U.S.C. section 360bbb-3(b)(1), unless the authorization is terminated or revoked.  Performed at [facility], [address], [place], [place] [address]   MRSA Next Gen by PCR, Nasal     Status: None   Collection Time: 10/01/20  4:54 AM  Specimen: Nasal Mucosa; Nasal Swab  Result Value Ref Range Status   MRSA by PCR Next Gen NOT DETECTED NOT DETECTED Final    Comment: (NOTE) The GeneXpert MRSA Assay (FDA approved for NASAL specimens only), is one component of a comprehensive MRSA colonization surveillance program. It is not intended to diagnose MRSA infection nor to guide or monitor treatment for MRSA infections. Test performance is not FDA approved in patients less than 8 years old. Performed at [facility], [address] [place], [place] [address]     Imaging: Overnight EEG with video  Result Date: 10/03/2020 Lora Havens, MD     10/03/2020   1:13 PM Patient Name: Jaelynn Colom MRN: [id number] Epilepsy Attending: Lora Havens Referring Physician/Provider: Dr Jennette Kettle Duration: 10/02/2020 1924 to 10/03/2020 1124 Patient history: 50 y.o. female with PMH significant for significant for chronic pain, [place] on CPAP, CAD, chronic dCHF, DM2, HTN, hernia repair at [facility] who was seen at [facility] for R sided jerking and R sided weakness and is sent here of characterization of these episodes.  EEG to evaluate for seizures. Level of alertness: Awake, asleep AEDs during EEG study: Gabapentin Technical aspects: This EEG study was done with scalp electrodes positioned according to the 10-20 International system of electrode placement. Electrical activity was acquired at a sampling rate of '500Hz'$  and reviewed with a high frequency filter of '70Hz'$  and a low frequency filter of '1Hz'$ . EEG data were recorded continuously and digitally stored. Description: The posterior dominant rhythm consists of 8-'9Hz'$  activity of moderate voltage (25-35 uV) seen predominantly in posterior head regions, symmetric and reactive to eye opening and eye closing. Sleep was characterized by vertex waves, sleep spindles (12 to 14 Hz), maximal frontocentral region.  There is an excessive amount of 15 to 18 Hz beta activity with irregular morphology distributed symmetrically and diffusely. Multiple patient events were recorded during which patient was noted to have side to side head movement, turning around in bed, intermittent nonrhythmic left and right upper extremity jerking.  Patient was able to answer questions during the episodes.  Concomitant EEG before, during and after the event showed normal posterior dominant rhythm. Hyperventilation and photic stimulation were not performed.   ABNORMALITY - Excessive beta, generalized IMPRESSION: This study is within normal limits. The excessive beta activity seen in the background is most likely due to the effect of benzodiazepine and is a benign EEG pattern. No  seizures or epileptiform discharges were seen throughout the recording. Multiple patient events recorded during the study as described above without concomitant EEG change.  These episodes are NON epileptic. Priyanka Gwenlyn Fudge, MD PGY-1 10/04/2020 [place] Department of Psychiatry

## 2020-10-05 ENCOUNTER — Other Ambulatory Visit: Payer: Self-pay | Admitting: Neurosurgery

## 2020-10-13 ENCOUNTER — Ambulatory Visit: Payer: Medicare Other | Admitting: Gastroenterology

## 2020-10-13 ENCOUNTER — Encounter: Payer: Self-pay | Admitting: *Deleted

## 2020-10-28 ENCOUNTER — Inpatient Hospital Stay: Admission: RE | Admit: 2020-10-28 | Payer: Medicare Other | Source: Ambulatory Visit

## 2020-10-28 ENCOUNTER — Other Ambulatory Visit: Payer: Medicare Other

## 2020-10-31 ENCOUNTER — Other Ambulatory Visit: Payer: Self-pay

## 2020-10-31 ENCOUNTER — Other Ambulatory Visit
Admission: RE | Admit: 2020-10-31 | Discharge: 2020-10-31 | Disposition: A | Discharge status: Home / Self Care | Payer: Medicare Other | Source: Ambulatory Visit | Attending: Neurosurgery | Admitting: Neurosurgery

## 2020-10-31 ENCOUNTER — Encounter: Payer: Self-pay | Admitting: Urgent Care

## 2020-10-31 DIAGNOSIS — Z01812 Encounter for preprocedural laboratory examination: Secondary | ICD-10-CM | POA: Insufficient documentation

## 2020-11-02 ENCOUNTER — Encounter: Payer: Self-pay | Admitting: Emergency Medicine

## 2020-11-02 ENCOUNTER — Emergency Department: Payer: Medicare Other

## 2020-11-02 ENCOUNTER — Other Ambulatory Visit: Payer: Self-pay

## 2020-11-02 ENCOUNTER — Emergency Department
Admission: EM | Admit: 2020-11-02 | Discharge: 2020-11-02 | Disposition: A | Discharge status: Home / Self Care | Payer: Medicare Other | Attending: Emergency Medicine | Admitting: Emergency Medicine

## 2020-11-02 DIAGNOSIS — I5032 Chronic diastolic (congestive) heart failure: Secondary | ICD-10-CM | POA: Diagnosis not present

## 2020-11-02 DIAGNOSIS — Z7951 Long term (current) use of inhaled steroids: Secondary | ICD-10-CM | POA: Insufficient documentation

## 2020-11-02 DIAGNOSIS — I2510 Atherosclerotic heart disease of native coronary artery without angina pectoris: Secondary | ICD-10-CM | POA: Diagnosis not present

## 2020-11-02 DIAGNOSIS — Z87891 Personal history of nicotine dependence: Secondary | ICD-10-CM | POA: Insufficient documentation

## 2020-11-02 DIAGNOSIS — J449 Chronic obstructive pulmonary disease, unspecified: Secondary | ICD-10-CM | POA: Diagnosis not present

## 2020-11-02 DIAGNOSIS — Z8673 Personal history of transient ischemic attack (TIA), and cerebral infarction without residual deficits: Secondary | ICD-10-CM | POA: Insufficient documentation

## 2020-11-02 DIAGNOSIS — E039 Hypothyroidism, unspecified: Secondary | ICD-10-CM | POA: Insufficient documentation

## 2020-11-02 DIAGNOSIS — N183 Chronic kidney disease, stage 3 unspecified: Secondary | ICD-10-CM | POA: Diagnosis not present

## 2020-11-02 DIAGNOSIS — R0789 Other chest pain: Secondary | ICD-10-CM | POA: Insufficient documentation

## 2020-11-02 DIAGNOSIS — Z794 Long term (current) use of insulin: Secondary | ICD-10-CM | POA: Insufficient documentation

## 2020-11-02 DIAGNOSIS — R252 Cramp and spasm: Secondary | ICD-10-CM | POA: Diagnosis not present

## 2020-11-02 DIAGNOSIS — I130 Hypertensive heart and chronic kidney disease with heart failure and stage 1 through stage 4 chronic kidney disease, or unspecified chronic kidney disease: Secondary | ICD-10-CM | POA: Insufficient documentation

## 2020-11-02 DIAGNOSIS — R079 Chest pain, unspecified: Secondary | ICD-10-CM | POA: Diagnosis present

## 2020-11-02 DIAGNOSIS — E1122 Type 2 diabetes mellitus with diabetic chronic kidney disease: Secondary | ICD-10-CM | POA: Diagnosis not present

## 2020-11-02 DIAGNOSIS — Z7982 Long term (current) use of aspirin: Secondary | ICD-10-CM | POA: Diagnosis not present

## 2020-11-02 DIAGNOSIS — Z79899 Other long term (current) drug therapy: Secondary | ICD-10-CM | POA: Insufficient documentation

## 2020-11-02 DIAGNOSIS — J45909 Unspecified asthma, uncomplicated: Secondary | ICD-10-CM | POA: Insufficient documentation

## 2020-11-02 LAB — CBC
HCT: 34.3 % — ABNORMAL LOW (ref 36.0–46.0)
Hemoglobin: 11.5 g/dL — ABNORMAL LOW (ref 12.0–15.0)
MCH: 29.6 pg (ref 26.0–34.0)
MCHC: 33.5 g/dL (ref 30.0–36.0)
MCV: 88.2 fL (ref 80.0–100.0)
Platelets: 231 10*3/uL (ref 150–400)
RBC: 3.89 MIL/uL (ref 3.87–5.11)
RDW: 14.9 % (ref 11.5–15.5)
WBC: 6.1 10*3/uL (ref 4.0–10.5)
nRBC: 0 % (ref 0.0–0.2)

## 2020-11-02 LAB — BASIC METABOLIC PANEL
Anion gap: 12 (ref 5–15)
BUN: 11 mg/dL (ref 6–20)
CO2: 26 mmol/L (ref 22–32)
Calcium: 9.3 mg/dL (ref 8.9–10.3)
Chloride: 98 mmol/L (ref 98–111)
Creatinine, Ser: 0.75 mg/dL (ref 0.44–1.00)
GFR, Estimated: >60 mL/min (ref >60)
Glucose, Bld: 132 mg/dL — ABNORMAL HIGH (ref 70–99)
Potassium: 3.6 mmol/L (ref 3.5–5.1)
Sodium: 136 mmol/L (ref 135–145)

## 2020-11-02 LAB — TYPE AND SCREEN
ABO/RH(D): A NEG
Antibody Screen: NEGATIVE

## 2020-11-02 LAB — TROPONIN I (HIGH SENSITIVITY)
Troponin I (High Sensitivity): 3 ng/L (ref <18)
Troponin I (High Sensitivity): 3 ng/L (ref <18)

## 2020-11-02 MED ORDER — HYDROCODONE-ACETAMINOPHEN 5-325 MG PO TABS
1.0000 | ORAL_TABLET | Freq: Once | ORAL | Status: AC
  Administered 2020-11-02: 1 via ORAL
  Filled 2020-11-02: qty 1

## 2020-11-02 MED ORDER — NITROGLYCERIN 0.4 MG SL SUBL
0.4000 mg | SUBLINGUAL_TABLET | SUBLINGUAL | Status: DC | PRN
  Administered 2020-11-02: 0.4 mg via SUBLINGUAL
  Filled 2020-11-02: qty 1

## 2020-11-02 NOTE — ED Notes (Signed)
AAOx3.  Skin warm and dry. No SOB/ DOE.  Talking on phone.  Patient in no acute distress.

## 2020-11-02 NOTE — ED Provider Notes (Signed)
[facility] Emergency Department Provider Note    Event Date/Time   First MD Initiated Contact with Patient 11/02/20 1237     (approximate)  I have reviewed the triage vital signs and the nursing notes.   HISTORY  Chief Complaint Chest Pain    HPI Marie Clarke is a 50 y.o. female with extensive past medical history listed below presents to the ER for recurrent left-sided chest pain occurred while she was at cardiology clinic.  States it was brief in nature radiating to her left arm.  No associated diaphoresis.  Was given aspirin as well as nitro.  States that she has nitro at home is currently pain-free.  No fevers no abdominal pain.  States the Percocet also helped.  Past Medical History:  Diagnosis Date   Anemia    Anginal pain (HCC)    Anxiety    Arthritis    Asthma    Bilateral lower extremity edema    Bipolar disorder ([place])    CAD (coronary artery disease) unk   CHF (congestive heart failure) (HCC)    Chronic pain syndrome    COPD (chronic obstructive pulmonary disease) ([place])    emphysema   DDD (degenerative disc disease), lumbar    Depression unk   Diabetes mellitus without complication ([place])    Diabetes mellitus, type II ([place])    Drug overdose 05/06/2013   after dad died   Dysrhythmia    history of tachy arrythmias   Fibromyalgia    GERD (gastroesophageal reflux disease)    Headache    chronic migraines   Hyperlipidemia    Hypertension    Hypothyroidism    Left leg pain 05-07-14   MI (myocardial infarction) ([place]) 2005-05-06   Muscle ache 09/16/2014   Osteoporosis    Overactive bladder    Pancreatitis unk   Panic attacks    PTSD (post-traumatic stress disorder)    Reflex sympathetic dystrophy    Renal cyst, left    Renal insufficiency    ckd stage iii   Restless legs syndrome    Sleep apnea 06/2019   uses cpap   Sleep apnea    Stroke ([place]) May 06, 2008   TIA x 2. no residual deficits   Suicide attempt [facility])    after father died.    Thyroid disease    thyroid nodule   TIA (transient ischemic attack) unk   TIA (transient ischemic attack)    Family History  Problem Relation Age of Onset   Diabetes Mellitus II Mother    CAD Mother    Sleep apnea Mother    Osteoarthritis Mother    Osteoporosis Mother    Anxiety disorder Mother    Depression Mother    Bipolar disorder Mother    Bipolar disorder Father    Hypertension Father    Depression Father    Anxiety disorder Father    Post-traumatic stress disorder Sister    Past Surgical History:  Procedure Laterality Date   ABDOMINAL HYSTERECTOMY     CARDIAC CATHETERIZATION     HERNIA REPAIR  07/15/2017   [facility]   lumbar facet     several   MELANOMA EXCISION  03/2020   OUTSIDE OF RECTUM   prolapse rectum surgery N/A 08/2014   THORACIC LAMINECTOMY FOR SPINAL CORD STIMULATOR N/A 07/27/2019   Procedure: THORACIC SPINAL CORD STIMULATOR PLACEMENT WITH RIGHT FLANK PULSE GENERATOR;  Surgeon: [name], MD;  Location: [facility] ORS;  Service: Neurosurgery;  Laterality: N/A;   TONSILLECTOMY  Patient Active Problem List   Diagnosis Date Noted   Seizure ([place]) 10/04/2020   Insomnia with sleep apnea    Insomnia due to psychological stress    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   Seizure-like activity ([place]) 09/30/2020   Hyperlipidemia    COPD (chronic obstructive pulmonary disease) (HCC)    Chronic diastolic CHF (congestive heart failure) (HCC)    Pain in thoracic spine 05/02/2020   Rectal pain 04/07/2020   Partial small bowel obstruction (HCC) 03/29/2020   Adrenal nodule ([place]) 03/19/2020   Hypokalemia    SBO (small bowel obstruction) ([place]) 12/02/2019   Chronic pain 12/02/2019   Presence of neurostimulator (SCS) (June 2021) 10/06/2019   Pain due to any device, implant or graft 09/21/2019   Anxiety 09/10/2019   Involuntary movements 09/10/2019   Abnormal involuntary movement 08/18/2019   Bipolar disorder, in full remission, most recent episode mixed ([place]) 08/11/2019    Lumbosacral radiculopathy at L5 (Right) 04/09/2019   Chronic migraine 03/12/2019   Gout 01/21/2019   Noncompliance with treatment regimen 12/29/2018   BMI 40.0-44.9, adult ([place]) 11/19/2018   AKI (acute kidney injury) ([place]) 11/17/2018   Postural dizziness with presyncope 09/29/2018   Bipolar I disorder, most recent episode mixed ([place]) 09/10/2018   Panic attacks 09/10/2018   Chronic lumbosacral L5-S1 IVD protrusion (Bilateral) 08/25/2018   Lumbar lateral recess stenosis (L5-S1) (Bilateral) 08/25/2018   Wound disruption 08/05/2018   Osteoarthritis involving multiple joints 07/10/2018   Long term current use of non-steroidal anti-inflammatories (NSAID) 07/10/2018   NSAID induced gastritis 07/10/2018   DDD (degenerative disc disease), lumbosacral 04/22/2018   Disease related peripheral neuropathy 11/13/2017   Gastroesophageal reflux disease without esophagitis 11/13/2017   Occipital headache (Bilateral) 11/13/2017   Cervicogenic headache (Bilateral) (L>R) 11/13/2017   History of postoperative nausea 10/22/2017   Chronic shoulder pain (5th area of Pain) (Bilateral) (L>R) 10/09/2017   Ankle joint instability (Left) 10/09/2017   Ankle sprain, sequela (Left) 10/09/2017   History of psychiatric symptoms 10/09/2017   Chronic pain syndrome 10/02/2017   Spondylosis without myelopathy or radiculopathy, lumbosacral region 10/02/2017   Chronic musculoskeletal pain 10/02/2017   Elevated C-reactive protein (CRP) 09/10/2017   Elevated sed rate 09/10/2017   Chronic neck pain (4th area of Pain) (Bilateral) (L>R) 09/09/2017   Pharmacologic therapy 09/09/2017   Disorder of skeletal system 09/09/2017   Problems influencing health status 09/09/2017   Long term current use of opiate analgesic 09/09/2017   Tobacco use disorder 07/16/2017   Ventral hernia without obstruction or gangrene 06/25/2017   Strain of extensor muscle, fascia and tendon of left index finger at wrist and hand level, initial encounter  05/16/2017   Sepsis ([place]) 04/14/2017   Osteopenia 04/03/2017   Hypotension 09/17/2016   Contusion of knee (Left) 10/12/2015   Strain of knee (Left) 10/12/2015   Incidental lung nodule 04/28/2015   Chronic low back pain (1ry area of Pain) (Bilateral) (L>R) 03/28/2015   Chronic lower extremity pain (Referred) (2ry area of Pain) (Left) 03/28/2015   Abdominal wound dehiscence 03/28/2015   Encounter for pain management planning 03/28/2015   Morbid obesity ([place]) 03/28/2015   Abnormal CT scan, lumbar spine 03/28/2015   Lumbar facet hypertrophy 03/28/2015   Lumbar facet syndrome (Bilateral) (L>R) 03/28/2015   Lumbar foraminal stenosis (Bilateral) (L5-S1) 03/28/2015   Chronic ankle pain (3ry area of Pain) (Left) 03/28/2015   Neurogenic pain 03/28/2015   Neuropathic pain 03/28/2015   Myofascial pain 03/28/2015   History of suicide attempt 03/28/2015   PTSD (post-traumatic  stress disorder) 01/13/2015   Abnormal gait 12/15/2014   Congestive heart failure ([place]) 11/15/2014   Abdominal wall abscess 09/20/2014   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   Diabetes mellitus, type 2 ([place]) 08/13/2014   Bipolar affective disorder ([place]) 08/13/2014   Type 2 diabetes mellitus with hyperlipidemia ([place]) 08/13/2014   Rectal prolapse 08/09/2014   Rectal bleeding 08/09/2014   Rectal bleed 08/09/2014   Affective bipolar disorder ([place]) 08/05/2014   Arteriosclerosis of coronary artery 08/05/2014   CCF (congestive cardiac failure) ([place]) 08/05/2014   Chronic kidney disease 08/05/2014   Detrusor muscle hypertonia 08/05/2014   Apnea, sleep 08/05/2014   Temporary cerebral vascular dysfunction 08/05/2014   Polypharmacy 04/29/2014   Other long term (current) drug therapy 04/29/2014   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 Authorizing Provider  aspirin EC 81 MG tablet Take 81 mg by mouth daily. Swallow whole.    [provider]  atorvastatin (LIPITOR) 40 MG tablet Take 40 mg by mouth at bedtime. 04/05/20   [provider]  CAPLYTA 42 MG capsule Take 42 mg by mouth at bedtime. 09/20/20   [provider]  cholecalciferol (VITAMIN D3) 25 MCG (1000 UNIT) tablet Take 1,000 Units by mouth daily.    [provider]  clonazepam (KLONOPIN) 0.125 MG disintegrating tablet Take 0.125 mg by mouth 3 (three) times daily. 08/25/20   [provider]  cyanocobalamin 1000 MCG tablet Take 1,000 mcg by mouth daily.    [provider]  DALIRESP 500 MCG TABS tablet Take 500 mcg by mouth daily. 10/20/20   [provider]  Dulaglutide (TRULICITY) 3.57 SV/7.7LT SOPN Inject 0.75 mg into the skin every Thursday. 08/03/20   [provider]  famotidine (PEPCID) 20 MG tablet Take 40 mg by mouth at bedtime.    [provider]  furosemide (LASIX) 80 MG tablet Take 80 mg by mouth 2 (two) times daily. 09/08/20   [provider]  gabapentin (NEURONTIN) 300 MG capsule Take 300-600 mg by mouth See admin instructions. Take 300 mg in the morning and noon and 600 mg at bedtime 08/25/20   [provider]  hydrOXYzine (ATARAX/VISTARIL) 25 MG tablet Take 1 tablet (25 mg total) by mouth 2 (two) times daily as needed for anxiety. 10/04/20   Ghimire, Henreitta Leber, MD  hydrOXYzine (ATARAX/VISTARIL) 50 MG tablet Take 1 tablet (50 mg total) by mouth at bedtime. 10/04/20   Ghimire, Henreitta Leber, MD  levocetirizine (XYZAL) 5 MG tablet Take 5 mg by mouth daily. 10/18/20   [provider]  levothyroxine (SYNTHROID) 112 MCG tablet Take 224 mcg by mouth daily before breakfast. 11/10/19 11/09/20   [provider]  methocarbamol (ROBAXIN) 750 MG tablet Take 750 mg by mouth 3 (three) times daily. 10/18/20   [provider]  metoprolol succinate (TOPROL-XL) 50 MG 24 hr tablet Take 50 mg by mouth 2 (two) times daily. 04/29/20 04/29/21  [provider]  montelukast (SINGULAIR) 10 MG tablet Take 10 mg by mouth at bedtime.    [provider]  Multiple Vitamin (MULTIVITAMIN) tablet Take 1 tablet by mouth daily.    [provider]  naproxen (NAPROSYN) 500 MG tablet  Take 500 mg by mouth 2 (two) times daily. 10/06/20   [provider]  nitroGLYCERIN (NITROSTAT) 0.4 MG SL tablet Place 0.4 mg under the tongue every 5 (five) minutes as needed for chest pain. 04/21/20   [provider]  omeprazole (PRILOSEC) 40 MG capsule Take 40 mg by mouth 2 (two) times daily.    [provider]  potassium chloride SA (KLOR-CON) 20 MEQ tablet Take 40 mEq by mouth 2 (two) times daily. 12/25/19   [provider]  PROAIR HFA 108 (90 Base) MCG/ACT inhaler Inhale 2 puffs into the lungs every 6 (six) hours as needed for wheezing or shortness of breath.    [provider]  promethazine (PHENERGAN) 25 MG tablet Take 25 mg by mouth every 6 (six) hours as needed for nausea/vomiting. 10/19/20   [provider]  rizatriptan (MAXALT) 10 MG tablet Take 10 mg by mouth as needed for migraine. 10/19/20   [provider]  rOPINIRole (REQUIP) 1 MG tablet Take 1 mg by mouth 3 (three) times daily. 12/10/19   [provider]  SYMBICORT 160-4.5 MCG/ACT inhaler Inhale 2 puffs into the lungs 2 (two) times daily.    [provider]  vitamin C (ASCORBIC ACID) 500 MG tablet Take 500 mg by mouth daily.    [provider]  vitamin E 180 MG (400 UNITS) capsule Take 400 Units by mouth daily.    [provider]    Allergies Diazepam, Ziprasidone hcl, Azithromycin, Divalproex sodium, Levofloxacin, Lisinopril,  Metronidazole, Sulfa antibiotics, Valproic acid, Cephalexin, Ciprofloxacin, Doxycycline, and Penicillins    Social History Social History   Tobacco Use   Smoking status: Former    Packs/day: 0.50    Types: Cigarettes    Quit date: 09/01/2018    Years since quitting: 2.1   Smokeless tobacco: Never   Tobacco comments:    Patient quit smoking 09/01/2018  Vaping Use   Vaping Use: Never used  Substance Use Topics   Alcohol use: No    Alcohol/week: 0.0 standard drinks   Drug use: No    Review of Systems Patient denies headaches, rhinorrhea, blurry vision, numbness, shortness of breath, chest pain, edema, cough, abdominal pain, nausea, vomiting, diarrhea, dysuria, fevers, rashes or hallucinations unless otherwise stated above in HPI. ____________________________________________   PHYSICAL EXAM:  VITAL SIGNS: Vitals:   11/02/20 0950 11/02/20 1257  BP: 119/73 107/76  Pulse: (!) 101 (!) 108  Resp: 18 (!) 25  Temp: 98.1 F (36.7 C)   SpO2: 99% 96%    Constitutional: Alert and oriented.  Eyes: Conjunctivae are normal.  Head: Atraumatic. Nose: No congestion/rhinnorhea. Mouth/Throat: Mucous membranes are moist.   Neck: No stridor. Painless ROM.  Cardiovascular: Normal rate, regular rhythm. Grossly normal heart sounds.  Good peripheral circulation. Respiratory: Normal respiratory effort.  No retractions. Lungs CTAB. Gastrointestinal: Soft and nontender. No distention. No abdominal bruits. No CVA tenderness. Genitourinary:  Musculoskeletal: No lower extremity tenderness nor edema.  No joint effusions. Neurologic:  Normal speech and language. No gross focal neurologic deficits are appreciated. No facial droop Skin:  Skin is warm, dry and intact. No rash noted. Psychiatric: Mood and affect are normal. Speech and behavior are normal.  ____________________________________________   LABS (all labs ordered are listed, but only abnormal results are displayed)  Results for orders  placed or performed during the hospital encounter of 11/02/20 (from the past 24 hour(s))  Basic metabolic panel     Status: Abnormal   Collection Time: 11/02/20 11:28 AM  Result Value Ref Range   Sodium 136 135 - 145 mmol/L   Potassium 3.6 3.5 - 5.1 mmol/L   Chloride 98 98 - 111 mmol/L   CO2 26 22 - 32 mmol/L   Glucose, Bld 132 (H) 70 - 99 mg/dL   BUN 11 6 - 20 mg/dL   Creatinine, Ser 0.75 0.44 - 1.00 mg/dL   Calcium 9.3 8.9 - 10.3 mg/dL   GFR, Estimated >60 >60 mL/min   Anion gap 12 5 - 15  CBC     Status: Abnormal   Collection Time: 11/02/20 11:28 AM  Result Value Ref Range   WBC 6.1 4.0 - 10.5 K/uL   RBC 3.89 3.87 - 5.11 MIL/uL   Hemoglobin 11.5 (L) 12.0 - 15.0 g/dL   HCT 34.3 (L) 36.0 - 46.0 %   MCV 88.2 80.0 - 100.0 fL   MCH 29.6 26.0 - 34.0 pg   MCHC 33.5 30.0 - 36.0 g/dL   RDW 14.9 11.5 - 15.5 %   Platelets 231 150 - 400 K/uL   nRBC 0.0 0.0 - 0.2 %  Troponin I (High Sensitivity)     Status: None   Collection Time: 11/02/20 11:28 AM  Result Value Ref Range   Troponin I (High Sensitivity) 3 <18 ng/L  Troponin I (High Sensitivity)     Status: None   Collection Time: 11/02/20  1:31 PM  Result Value Ref Range   Troponin I (High Sensitivity) 3 <18 ng/L   ____________________________________________  EKG My review and personal interpretation at Time: 9:50   Indication: chest pain  Rate: 95  Rhythm: sinus Axis: normal Other: nonspecific st abn, no stemi or depresion ____________________________________________  RADIOLOGY  I personally reviewed all radiographic images ordered to evaluate for the above acute complaints and reviewed radiology reports and findings.  These findings were personally discussed with the patient.  Please see medical record for radiology report.  ____________________________________________   PROCEDURES  Procedure(s) performed:  Procedures    Critical Care performed: no ____________________________________________   INITIAL  IMPRESSION / ASSESSMENT AND PLAN / ED COURSE  Pertinent labs & imaging results that were available during my care of the patient were reviewed by me and considered in my medical decision making (see chart for details).   DDX: ACS, pericarditis, esophagitis, boerhaaves, pe, dissection, pna, bronchitis, costochondritis   Marie Clarke is a 50 y.o. who presents to the ED with presentation as described above.  Patient well-appearing in no acute distress currently pain-free.  EKG stable nonischemic.  Initial troponin negative.  Remainder blood work is reassuring not consistent infection.  Abdominal exam is soft benign.  Chest x-ray is reassuring.  Is not consistent with dissection or PE.  Case discussed with cardiology.  Will observe for repeat troponin and if negative patient will be appropriate for close outpatient follow-up.  Clinical Course as of 11/02/20 1407  Wed Nov 02, 2020  1402 Cardiac enzyme is negative.  Patient nontoxic-appearing and stable and appropriate for outpatient follow-up. [PR]    Clinical Course User Index [PR] Merlyn Lot, MD    The patient was evaluated in Emergency Department today for the symptoms described in the history of present illness. He/she was evaluated in the context of the global COVID-19 pandemic, which necessitated consideration that the patient might be at risk for infection with the SARS-CoV-2 virus that causes COVID-19. Institutional protocols and algorithms that pertain to the evaluation of patients at risk for COVID-19 are in a state of rapid change based on  information released by regulatory bodies including the CDC and federal and state organizations. These policies and algorithms were followed during the patient's care in the ED.  As part of my medical decision making, I reviewed the following data within the [facility] notes reviewed and incorporated, Labs reviewed, notes from prior ED visits and [place] Controlled Substance  Database   ____________________________________________   FINAL CLINICAL IMPRESSION(S) / ED DIAGNOSES  Final diagnoses:  Atypical chest pain      NEW MEDICATIONS STARTED DURING THIS VISIT:  New Prescriptions   No medications on file     Note:  This document was prepared using Dragon voice recognition software and may include unintentional dictation errors.    Merlyn Lot, MD 11/02/20 1407

## 2020-11-02 NOTE — ED Provider Notes (Signed)
Emergency Medicine Provider Triage Evaluation Note  Marie Clarke , a 50 y.o. female  was evaluated in triage.  Pt complains of chest pain that started while in cardiology office today. She was sent to the ER for further evaluation. Pain is left side chest, sharp. No nausea or diaphoresis. Previous cardiac history. No relief with ASA and NTG prior to arrival.  Review of Systems  Positive: Chest pain Negative: Shortness of breath  Physical Exam  LMP  (LMP Unknown)  Gen:   Awake, no distress   Resp:  Normal effort  MSK:   Moves extremities without difficulty  Other:   Medical Decision Making  Medically screening exam initiated at 9:48 AM.  Appropriate orders placed.  Delaila Nand was informed that the remainder of the evaluation will be completed by another provider, this initial triage assessment does not replace that evaluation, and the importance of remaining in the ED until their evaluation is complete.    Victorino Dike, FNP 11/02/20 1122    Lucrezia Starch, MD 11/02/20 [phone]

## 2020-11-02 NOTE — ED Triage Notes (Signed)
Pt brought over from [facility], pt states she was there for results about her test, that's she states where fine, pt states she started having chest pain while there, pt was given 3 nitro SL and 324mg  ASA. Pt is in NAD on arrival.

## 2020-11-02 NOTE — ED Notes (Signed)
Pateitn c/o continued CP.  Asking for NTG.  Patient aggitated stating "Look, you need to do something for my pain or transfer me somewhere else".  Skin warm and dry  No SOB/ DOE.  Dr. Ellender Hose alerted and out to waiting room to assess patient.

## 2020-11-04 ENCOUNTER — Emergency Department
Admission: EM | Admit: 2020-11-04 | Discharge: 2020-11-04 | Disposition: A | Discharge status: Home / Self Care | Payer: Medicare Other | Attending: Emergency Medicine | Admitting: Emergency Medicine

## 2020-11-04 ENCOUNTER — Other Ambulatory Visit: Payer: Self-pay

## 2020-11-04 DIAGNOSIS — Z7984 Long term (current) use of oral hypoglycemic drugs: Secondary | ICD-10-CM | POA: Diagnosis not present

## 2020-11-04 DIAGNOSIS — I5032 Chronic diastolic (congestive) heart failure: Secondary | ICD-10-CM | POA: Insufficient documentation

## 2020-11-04 DIAGNOSIS — E039 Hypothyroidism, unspecified: Secondary | ICD-10-CM | POA: Diagnosis not present

## 2020-11-04 DIAGNOSIS — J449 Chronic obstructive pulmonary disease, unspecified: Secondary | ICD-10-CM | POA: Diagnosis not present

## 2020-11-04 DIAGNOSIS — N183 Chronic kidney disease, stage 3 unspecified: Secondary | ICD-10-CM | POA: Insufficient documentation

## 2020-11-04 DIAGNOSIS — R252 Cramp and spasm: Secondary | ICD-10-CM | POA: Diagnosis present

## 2020-11-04 DIAGNOSIS — I2510 Atherosclerotic heart disease of native coronary artery without angina pectoris: Secondary | ICD-10-CM | POA: Insufficient documentation

## 2020-11-04 DIAGNOSIS — Z7982 Long term (current) use of aspirin: Secondary | ICD-10-CM | POA: Diagnosis not present

## 2020-11-04 DIAGNOSIS — Z79899 Other long term (current) drug therapy: Secondary | ICD-10-CM | POA: Diagnosis not present

## 2020-11-04 DIAGNOSIS — J45909 Unspecified asthma, uncomplicated: Secondary | ICD-10-CM | POA: Diagnosis not present

## 2020-11-04 DIAGNOSIS — I130 Hypertensive heart and chronic kidney disease with heart failure and stage 1 through stage 4 chronic kidney disease, or unspecified chronic kidney disease: Secondary | ICD-10-CM | POA: Diagnosis not present

## 2020-11-04 DIAGNOSIS — Z87891 Personal history of nicotine dependence: Secondary | ICD-10-CM | POA: Diagnosis not present

## 2020-11-04 DIAGNOSIS — E1122 Type 2 diabetes mellitus with diabetic chronic kidney disease: Secondary | ICD-10-CM | POA: Diagnosis not present

## 2020-11-04 DIAGNOSIS — R0789 Other chest pain: Secondary | ICD-10-CM | POA: Diagnosis not present

## 2020-11-04 DIAGNOSIS — Z7951 Long term (current) use of inhaled steroids: Secondary | ICD-10-CM | POA: Insufficient documentation

## 2020-11-04 LAB — CBC
HCT: 34.8 % — ABNORMAL LOW (ref 36.0–46.0)
Hemoglobin: 11.6 g/dL — ABNORMAL LOW (ref 12.0–15.0)
MCH: 29.7 pg (ref 26.0–34.0)
MCHC: 33.3 g/dL (ref 30.0–36.0)
MCV: 89 fL (ref 80.0–100.0)
Platelets: 236 10*3/uL (ref 150–400)
RBC: 3.91 MIL/uL (ref 3.87–5.11)
RDW: 15.2 % (ref 11.5–15.5)
WBC: 6 10*3/uL (ref 4.0–10.5)
nRBC: 0 % (ref 0.0–0.2)

## 2020-11-04 LAB — BASIC METABOLIC PANEL
Anion gap: 12 (ref 5–15)
BUN: 15 mg/dL (ref 6–20)
CO2: 24 mmol/L (ref 22–32)
Calcium: 9 mg/dL (ref 8.9–10.3)
Chloride: 101 mmol/L (ref 98–111)
Creatinine, Ser: 0.83 mg/dL (ref 0.44–1.00)
GFR, Estimated: >60 mL/min (ref >60)
Glucose, Bld: 147 mg/dL — ABNORMAL HIGH (ref 70–99)
Potassium: 5 mmol/L (ref 3.5–5.1)
Sodium: 137 mmol/L (ref 135–145)

## 2020-11-04 LAB — CK: Total CK: 97 U/L (ref 38–234)

## 2020-11-04 LAB — MAGNESIUM: Magnesium: 1.8 mg/dL (ref 1.7–2.4)

## 2020-11-04 MED ORDER — ACETAMINOPHEN 325 MG PO TABS
650.0000 mg | ORAL_TABLET | Freq: Once | ORAL | Status: AC
  Administered 2020-11-04: 650 mg via ORAL
  Filled 2020-11-04: qty 2

## 2020-11-04 MED ORDER — MORPHINE SULFATE (PF) 4 MG/ML IV SOLN
4.0000 mg | Freq: Once | INTRAVENOUS | Status: AC
  Administered 2020-11-04: 4 mg via INTRAVENOUS
  Filled 2020-11-04: qty 1

## 2020-11-04 MED ORDER — SODIUM CHLORIDE 0.9 % IV BOLUS
1000.0000 mL | Freq: Once | INTRAVENOUS | Status: AC
  Administered 2020-11-04: 1000 mL via INTRAVENOUS

## 2020-11-04 MED ORDER — ORPHENADRINE CITRATE 30 MG/ML IJ SOLN
60.0000 mg | Freq: Two times a day (BID) | INTRAMUSCULAR | Status: DC
  Administered 2020-11-04: 60 mg via INTRAMUSCULAR
  Filled 2020-11-04 (×3): qty 2

## 2020-11-04 MED ORDER — CLONAZEPAM 0.5 MG PO TABS
0.5000 mg | ORAL_TABLET | Freq: Once | ORAL | Status: AC
  Administered 2020-11-04: 0.5 mg via ORAL
  Filled 2020-11-04: qty 1

## 2020-11-04 MED ORDER — ORPHENADRINE CITRATE ER 100 MG PO TB12: 100.0000 mg | ORAL_TABLET | Freq: Two times a day (BID) | ORAL | 0 refills | Status: DC

## 2020-11-04 MED ORDER — ONDANSETRON HCL 4 MG/2ML IJ SOLN
4.0000 mg | Freq: Once | INTRAMUSCULAR | Status: AC
  Administered 2020-11-04: 4 mg via INTRAVENOUS
  Filled 2020-11-04: qty 2

## 2020-11-04 MED ORDER — ONDANSETRON 4 MG PO TBDP
4.0000 mg | ORAL_TABLET | Freq: Once | ORAL | Status: AC
  Administered 2020-11-04: 4 mg via ORAL
  Filled 2020-11-04: qty 1

## 2020-11-04 NOTE — ED Notes (Signed)
Pt tearful, states "that dr isn't listening to me, I want my potassium rechecked." Dr Corky Downs at bedside. This rn and [place] explained we had just drawn the blood and that it is accurate, no need to redraw. Pt states we aren't fixing the issue, this RN explained we are doing everything we can right now and are treated her accordingly. Pt appreciated explanation at this time.

## 2020-11-04 NOTE — ED Notes (Signed)
Pt resting in bed, call light in reach, meds given per order. Will continue to monitor.

## 2020-11-04 NOTE — ED Notes (Signed)
Pt is alert and oriented x4, resting in bed, bed locked and low, call light in reach. Tylenol given for feet "drawing up."

## 2020-11-04 NOTE — ED Triage Notes (Signed)
Pt presents to ER via ems c/o BIL leg cramping that started last night.  Pt states she was started on HCTZ 2 days ago and her lasix and metoprolol doses were increased.  Pt noted to have visible leg cramping on arrival.  Pt states she thinks her K+ may be low as this has happened to her before.  Pt A&O x4 at this time.

## 2020-11-04 NOTE — ED Provider Notes (Signed)
[facility] Emergency Department Provider Note   ____________________________________________    I have reviewed the triage vital signs and the nursing notes.   HISTORY  Chief Complaint Leg Pain (BIL leg cramping )     HPI Kaedence Connelly is a 50 y.o. female with extensive past medical history as detailed below who presents with cramping in her feet bilaterally.  Patient reports intermittent cramping in the feet which she says is painful.  She is convinced this is related to hypokalemia.  Review of medical record demonstrates the patient was admitted for evaluation seizure-like activity recently, had continuous EEG which demonstrated movements did not correspond with changes on EEG.  Patient is afebrile, she describes some mild nausea no abdominal pain.  No other myalgias.  Past Medical History:  Diagnosis Date   Anemia    Anginal pain (HCC)    Anxiety    Arthritis    Asthma    Bilateral lower extremity edema    Bipolar disorder ([place])    CAD (coronary artery disease) unk   CHF (congestive heart failure) (HCC)    Chronic pain syndrome    COPD (chronic obstructive pulmonary disease) ([place])    emphysema   DDD (degenerative disc disease), lumbar    Depression unk   Diabetes mellitus without complication ([place])    Diabetes mellitus, type II ([place])    Drug overdose May 05, 2013   after dad died   Dysrhythmia    history of tachy arrythmias   Fibromyalgia    GERD (gastroesophageal reflux disease)    Headache    chronic migraines   Hyperlipidemia    Hypertension    Hypothyroidism    Left leg pain 2014/05/06   MI (myocardial infarction) ([place]) 2005-05-05   Muscle ache 09/16/2014   Osteoporosis    Overactive bladder    Pancreatitis unk   Panic attacks    PTSD (post-traumatic stress disorder)    Reflex sympathetic dystrophy    Renal cyst, left    Renal insufficiency    ckd stage iii   Restless legs syndrome    Sleep apnea 06/2019   uses cpap   Sleep apnea     Stroke ([place]) 05/05/08   TIA x 2. no residual deficits   Suicide attempt [facility])    after father died.   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   Seizure-like activity ([place]) 09/30/2020   Hyperlipidemia    COPD (chronic obstructive pulmonary disease) (HCC)    Chronic diastolic CHF (congestive heart failure) (HCC)    Pain in thoracic spine 05/02/2020   Rectal pain 04/07/2020   Partial small bowel obstruction ([place]) 03/29/2020   Adrenal nodule ([place]) 03/19/2020   Hypokalemia    SBO (small bowel obstruction) ([place]) 12/02/2019   Chronic pain 12/02/2019   Presence of neurostimulator (SCS) (June 2021) 10/06/2019   Pain due to any device, implant or graft 09/21/2019   Anxiety 09/10/2019   Involuntary movements 09/10/2019   Abnormal involuntary movement 08/18/2019   Bipolar disorder, in full remission, most recent episode mixed ([place]) 08/11/2019   Lumbosacral radiculopathy at L5 (Right) 04/09/2019   Chronic migraine 03/12/2019   Gout 01/21/2019   Noncompliance with treatment  regimen 12/29/2018   BMI 40.0-44.9, adult ([place]) 11/19/2018   AKI (acute kidney injury) ([place]) 11/17/2018   Postural dizziness with presyncope 09/29/2018   Bipolar I disorder, most recent episode mixed ([place]) 09/10/2018   Panic attacks 09/10/2018   Chronic lumbosacral L5-S1 IVD protrusion (Bilateral) 08/25/2018   Lumbar lateral recess stenosis (L5-S1) (Bilateral) 08/25/2018   Wound disruption 08/05/2018   Osteoarthritis involving multiple joints 07/10/2018   Long term current use of non-steroidal anti-inflammatories (NSAID) 07/10/2018   NSAID induced gastritis 07/10/2018   DDD (degenerative disc disease), lumbosacral 04/22/2018   Disease related peripheral neuropathy  11/13/2017   Gastroesophageal reflux disease without esophagitis 11/13/2017   Occipital headache (Bilateral) 11/13/2017   Cervicogenic headache (Bilateral) (L>R) 11/13/2017   History of postoperative nausea 10/22/2017   Chronic shoulder pain (5th area of Pain) (Bilateral) (L>R) 10/09/2017   Ankle joint instability (Left) 10/09/2017   Ankle sprain, sequela (Left) 10/09/2017   History of psychiatric symptoms 10/09/2017   Chronic pain syndrome 10/02/2017   Spondylosis without myelopathy or radiculopathy, lumbosacral region 10/02/2017   Chronic musculoskeletal pain 10/02/2017   Elevated C-reactive protein (CRP) 09/10/2017   Elevated sed rate 09/10/2017   Chronic neck pain (4th area of Pain) (Bilateral) (L>R) 09/09/2017   Pharmacologic therapy 09/09/2017   Disorder of skeletal system 09/09/2017   Problems influencing health status 09/09/2017   Long term current use of opiate analgesic 09/09/2017   Tobacco use disorder 07/16/2017   Ventral hernia without obstruction or gangrene 06/25/2017   Strain of extensor muscle, fascia and tendon of left index finger at wrist and hand level, initial encounter 05/16/2017   Sepsis ([place]) 04/14/2017   Osteopenia 04/03/2017   Hypotension 09/17/2016   Contusion of knee (Left) 10/12/2015   Strain of knee (Left) 10/12/2015   Incidental lung nodule 04/28/2015   Chronic low back pain (1ry area of Pain) (Bilateral) (L>R) 03/28/2015   Chronic lower extremity pain (Referred) (2ry area of Pain) (Left) 03/28/2015   Abdominal wound dehiscence 03/28/2015   Encounter for pain management planning 03/28/2015   Morbid obesity ([place]) 03/28/2015   Abnormal CT scan, lumbar spine 03/28/2015   Lumbar facet hypertrophy 03/28/2015   Lumbar facet syndrome (Bilateral) (L>R) 03/28/2015   Lumbar foraminal stenosis (Bilateral) (L5-S1) 03/28/2015   Chronic ankle pain (3ry area of Pain) (Left) 03/28/2015   Neurogenic pain 03/28/2015   Neuropathic pain 03/28/2015   Myofascial pain  03/28/2015   History of suicide attempt 03/28/2015   PTSD (post-traumatic stress disorder) 01/13/2015   Abnormal gait 12/15/2014   Congestive heart failure ([place]) 11/15/2014   Abdominal wall abscess 09/20/2014   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   Diabetes mellitus, type 2 ([place]) 08/13/2014   Bipolar affective disorder ([place]) 08/13/2014   Type 2 diabetes mellitus with hyperlipidemia ([place]) 08/13/2014   Rectal prolapse 08/09/2014   Rectal bleeding 08/09/2014   Rectal bleed 08/09/2014   Affective bipolar disorder ([place]) 08/05/2014   Arteriosclerosis of coronary artery 08/05/2014   CCF (congestive cardiac failure) ([place]) 08/05/2014   Chronic kidney disease 08/05/2014   Detrusor muscle hypertonia 08/05/2014   Apnea, sleep 08/05/2014   Temporary cerebral vascular dysfunction 08/05/2014   Polypharmacy 04/29/2014   Other long term (current) drug therapy 04/29/2014   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   Chronic kidney disease, stage III (moderate) ([place]) 12/14/2013   Controlled diabetes mellitus type II without complication ([place]) 24/58/0998   Essential (primary) hypertension 12/03/2013   Adult hypothyroidism 12/03/2013   Controlled type 2 diabetes mellitus without complication ([place]) 33/82/5053    Past  Surgical History:  Procedure Laterality Date   ABDOMINAL HYSTERECTOMY     CARDIAC CATHETERIZATION     HERNIA REPAIR  07/15/2017   [facility]   lumbar facet     several   MELANOMA EXCISION  03/2020   OUTSIDE OF RECTUM   prolapse rectum surgery N/A 08/2014   THORACIC LAMINECTOMY FOR SPINAL CORD STIMULATOR N/A 07/27/2019   Procedure: THORACIC SPINAL CORD STIMULATOR PLACEMENT WITH RIGHT FLANK PULSE GENERATOR;  Surgeon: [name], MD;  Location: [facility] ORS;  Service: Neurosurgery;  Laterality: N/A;   TONSILLECTOMY      Prior to Admission medications   Medication Sig Start Date End Date Taking? Authorizing Provider  nitroGLYCERIN (NITROLINGUAL) 0.4 MG/SPRAY spray Place 1 spray under the tongue as  needed for chest pain. 11/02/20 11/02/21 Yes [provider]  aspirin EC 81 MG tablet Take 81 mg by mouth daily. Swallow whole.    [provider]  atorvastatin (LIPITOR) 40 MG tablet Take 40 mg by mouth at bedtime. 04/05/20   [provider]  CAPLYTA 42 MG capsule Take 42 mg by mouth at bedtime. 09/20/20   [provider]  cholecalciferol (VITAMIN D3) 25 MCG (1000 UNIT) tablet Take 1,000 Units by mouth daily.    [provider]  clonazepam (KLONOPIN) 0.125 MG disintegrating tablet Take 0.125 mg by mouth 3 (three) times daily. 08/25/20   [provider]  cyanocobalamin 1000 MCG tablet Take 1,000 mcg by mouth daily.    [provider]  DALIRESP 500 MCG TABS tablet Take 500 mcg by mouth daily. 10/20/20   [provider]  Dulaglutide (TRULICITY) 0.62 BJ/6.2GB SOPN Inject 0.75 mg into the skin every Thursday. 08/03/20   [provider]  famotidine (PEPCID) 20 MG tablet Take 40 mg by mouth at bedtime.    [provider]  furosemide (LASIX) 80 MG tablet Take 80 mg by mouth 2 (two) times daily. 09/08/20   [provider]  gabapentin (NEURONTIN) 300 MG capsule Take 300-600 mg by mouth See admin instructions. Take 300 mg in the morning and noon and 600 mg at bedtime 08/25/20   [provider]  hydrochlorothiazide (MICROZIDE) 12.5 MG capsule Take 12.5 mg by mouth daily. 11/02/20   [provider]  hydrOXYzine (ATARAX/VISTARIL) 25 MG tablet Take 1 tablet (25 mg total) by mouth 2 (two) times daily as needed for anxiety. 10/04/20   Ghimire, Henreitta Leber, MD  hydrOXYzine (ATARAX/VISTARIL) 50 MG tablet Take 1 tablet (50 mg total) by mouth at bedtime. 10/04/20   Ghimire, Henreitta Leber, MD  levocetirizine (XYZAL) 5 MG tablet Take 5 mg by mouth daily. 10/18/20   [provider]  levothyroxine (SYNTHROID) 112 MCG tablet Take 224 mcg by mouth daily before breakfast. 11/10/19 11/09/20  [provider]   methocarbamol (ROBAXIN) 750 MG tablet Take 750 mg by mouth 3 (three) times daily. 10/18/20   [provider]  metoprolol succinate (TOPROL-XL) 50 MG 24 hr tablet Take 50 mg by mouth 2 (two) times daily. 04/29/20 04/29/21  [provider]  montelukast (SINGULAIR) 10 MG tablet Take 10 mg by mouth at bedtime.    [provider]  Multiple Vitamin (MULTIVITAMIN) tablet Take 1 tablet by mouth daily.    [provider]  naproxen (NAPROSYN) 500 MG tablet Take 500 mg by mouth 2 (two) times daily. 10/06/20   [provider]  nitroGLYCERIN (NITROSTAT) 0.4 MG SL tablet Place 0.4 mg under the tongue every 5 (five) minutes as needed for chest pain.  04/21/20   [provider]  omeprazole (PRILOSEC) 40 MG capsule Take 40 mg by mouth 2 (two) times daily.    [provider]  potassium chloride SA (KLOR-CON) 20 MEQ tablet Take 40 mEq by mouth 2 (two) times daily. 12/25/19   [provider]  PROAIR HFA 108 (90 Base) MCG/ACT inhaler Inhale 2 puffs into the lungs every 6 (six) hours as needed for wheezing or shortness of breath.    [provider]  promethazine (PHENERGAN) 25 MG tablet Take 25 mg by mouth every 6 (six) hours as needed for nausea/vomiting. 10/19/20   [provider]  rizatriptan (MAXALT) 10 MG tablet Take 10 mg by mouth as needed for migraine. 10/19/20   [provider]  rOPINIRole (REQUIP) 1 MG tablet Take 1 mg by mouth 3 (three) times daily. 12/10/19   [provider]  SYMBICORT 160-4.5 MCG/ACT inhaler Inhale 2 puffs into the lungs 2 (two) times daily.    [provider]  traZODone (DESYREL) 50 MG tablet Take 50-150 mg by mouth at bedtime as needed for sleep. 10/24/20   [provider]  vitamin C (ASCORBIC ACID) 500 MG tablet Take 500 mg by mouth daily.    [provider]  vitamin E 180 MG (400 UNITS) capsule Take 400 Units by mouth daily.    [provider]      Allergies Diazepam, Ziprasidone hcl, Azithromycin, Divalproex sodium, Levofloxacin, Lisinopril, Metronidazole, Sulfa antibiotics, Valproic acid, Cephalexin, Ciprofloxacin, Doxycycline, and Penicillins  Family History  Problem Relation Age of Onset   Diabetes Mellitus II Mother    CAD Mother    Sleep apnea Mother    Osteoarthritis Mother    Osteoporosis Mother    Anxiety disorder Mother    Depression Mother    Bipolar disorder Mother    Bipolar disorder Father    Hypertension Father    Depression Father    Anxiety disorder Father    Post-traumatic stress disorder Sister     Social History Social History   Tobacco Use   Smoking status: Former    Packs/day: 0.50    Types: Cigarettes    Quit date: 09/01/2018    Years since quitting: 2.1   Smokeless tobacco: Never   Tobacco comments:    Patient quit smoking 09/01/2018  Vaping Use   Vaping Use: Never used  Substance Use Topics   Alcohol use: No    Alcohol/week: 0.0 standard drinks   Drug use: No    Review of Systems  Constitutional: No fever/chills Eyes: No visual changes.  ENT: No sore throat. Cardiovascular: Denies chest pain. Respiratory: Denies shortness of breath. Gastrointestinal: No abdominal pain.   Genitourinary: Negative for dysuria. Musculoskeletal: Cramping as above. Skin: Negative for rash. Neurological: Negative for headaches   ____________________________________________   PHYSICAL EXAM:  VITAL SIGNS: ED Triage Vitals  Enc Vitals Group     BP 11/04/20 0636 136/89     Pulse Rate 11/04/20 0636 100     Resp 11/04/20 0636 18     Temp 11/04/20 0636 98 F (36.7 C)     Temp Source 11/04/20 0636 Oral     SpO2 11/04/20 0636 98 %     Weight --      Height --      Head Circumference --      Peak Flow --      Pain Score 11/04/20 0634 10     Pain Loc --      Pain Edu? --  Excl. in [place]? --     Constitutional: Alert and oriented.  Anxious Eyes: Conjunctivae are normal.  Head:  Atraumatic. Nose: No congestion/rhinnorhea. Mouth/Throat: Mucous membranes are moist.   Neck:  Painless ROM Cardiovascular: Normal rate, regular rhythm. Grossly normal heart sounds.  Good peripheral circulation. Respiratory: Normal respiratory effort.  No retractions. Lungs CTAB. Gastrointestinal: Soft and nontender. No distention.  No CVA tenderness. Genitourinary: deferred Musculoskeletal: Cramping in the feet bilaterally, warm and well perfused, no erythema or cyanosis, no swelling Neurologic:  Normal speech and language. No gross focal neurologic deficits are appreciated.  Skin:  Skin is warm, dry and intact.  Psychiatric: Mood and affect are normal. Speech and behavior are normal.  ____________________________________________   LABS (all labs ordered are listed, but only abnormal results are displayed)  Labs Reviewed  CBC - Abnormal; Notable for the following components:      Result Value   Hemoglobin 11.6 (*)    HCT 34.8 (*)    All other components within normal limits  BASIC METABOLIC PANEL - Abnormal; Notable for the following components:   Glucose, Bld 147 (*)    All other components within normal limits  MAGNESIUM  CK   ____________________________________________  EKG  ED ECG REPORT I, Lavonia Drafts, the attending physician, personally viewed and interpreted this ECG.  Date: 11/04/2020  Rhythm: Sinus tachycardia QRS Axis: normal Intervals: normal ST/T Wave abnormalities: normal Narrative Interpretation: no evidence of acute ischemia  ____________________________________________  RADIOLOGY   ____________________________________________   PROCEDURES  Procedure(s) performed: No  Procedures   Critical Care performed: No ____________________________________________   INITIAL IMPRESSION / ASSESSMENT AND PLAN / ED COURSE  Pertinent labs & imaging results that were available during my care of the patient were reviewed by me and considered in my  medical decision making (see chart for details).   Patient overall well appearing in no acute distress, lower extremity exam appears normal.  Differential for cramping includes electrolyte abnormalities, dehydration, will add on CK as well treat with IV fluids, will give a dose of her Klonopin  Reassured patient that her potassium is actually top normal and that no indication for potassium supplementation at this time.  She is convinced that I am wrong and is demanding someone "higher than me "to "fix this ".  Assured her that work-up continues and medications take time to improve her cramping.  In review of medical records patient was here 2 days ago for a separate complaint. ----------------------------------------- 8:34 AM on 11/04/2020 -----------------------------------------  CK is normal.  We will give a dose of Norflex.  Remains well-appearing and in no acute distress.  Has told the nurse several times that she "must be admitted ", I have informed her at this time that she does not have a clear indication for admission but that work-up continues, she is very upset with myself and nurse.  ----------------------------------------- 10:25 AM on 11/04/2020 ----------------------------------------- Patient's cramping has resolved.  On reexam she is well-appearing and in no acute distress.  She remains slightly anxious but is appropriate for discharge with outpatient follow-up.  Again discussed that no indication for admission at this time outpatient follow-up recommended she can return as needed at any time.  She remains upset with [place]    ____________________________________________   FINAL CLINICAL IMPRESSION(S) / ED DIAGNOSES  Final diagnoses:  Muscle cramping        Note:  This document was prepared using Dragon voice recognition software and may include unintentional dictation errors.    Lory Galan,  Herbie Baltimore, MD 11/04/20 1026

## 2020-11-04 NOTE — ED Notes (Signed)
This rn brought pt warm blanket, pt appreciative.

## 2020-11-06 NOTE — Anesthesia Preprocedure Evaluation (Addendum)
Anesthesia Evaluation  Patient identified by MRN, date of birth, ID band Patient awake    Reviewed: Allergy & Precautions, NPO status , Patient's Chart, lab work & pertinent test results  History of Anesthesia Complications Negative for: history of anesthetic complications  Airway Mallampati: II  TM Distance: >3 FB Neck ROM: Full    Dental  (+) Edentulous Upper, Edentulous Lower   Pulmonary asthma , sleep apnea (inability to tolerate multiple CPAP mask fits) , COPD,  COPD inhaler, former smoker,    breath sounds clear to auscultation- rhonchi (-) wheezing      Cardiovascular Exercise Tolerance: Poor hypertension, Pt. on medications (-) angina+ CAD, + Past MI and +CHF (HFpEF)  (-) Cardiac Stents and (-) CABG + dysrhythmias (history of tachy arrythmias)  Rhythm:Regular Rate:Normal + Peripheral Edema- Systolic murmurs and - Diastolic murmurs Pharmacological stress test ( 12/2018: Moderately abnormal : function evidence of anterior defect mixed possibly with ischemia versus artifact. Normal overall left ventricular function of 55%. Recommend medical therapy and consider cardiac cath if symptoms persist or worsen)  ECHO 03/2020: Summary  1. The left ventricular systolic function is normal, LVEF is visually  estimated at 55-60%.  2. The right ventricle is mildly dilated in size, with normal systolic  function.  3. The right atrium is mildly dilated in size.  4. IVC size and inspiratory change suggest mildly elevated right atrial  pressure. (5-10 mmHg).     Neuro/Psych  Headaches, PSYCHIATRIC DISORDERS Anxiety Depression Bipolar Disorder pseudoseizuresChronic Pain Syndrome  S/P THORACIC LAMINECTOMY FOR SPINAL CORD STIMULATOR 2021 RSD - chronic pain in ankle TIACVA, No Residual Symptoms    GI/Hepatic Neg liver ROS, GERD  ,  Endo/Other  diabetes (A1c 6.5 on 8/20), Well Controlled, Type 2, Oral Hypoglycemic  AgentsHypothyroidism   Renal/GU negative Renal ROS     Musculoskeletal  (+) Arthritis , Fibromyalgia -  Abdominal (+) + obese,   Peds  Hematology  (+) anemia ,   Anesthesia Other Findings 50 y.o. female with extensive past medical history presented to ED 9/23 with cramping in her feet bilaterally and was discharged home after being treated with muscle relaxer and klonopin. Pt was evaluated in the ED 9/21 for chest pain for which workup of troponin,CXR, EKG were negative and patient was discharged with cardiology follow up. She was admitted 09/2020 for evaluation seizure-like activity and right facial droop, had continuous EEG which demonstrated movements did not correspond with changes on EEG. Psychiatry added Hydroxyzine prn for anxiety and scheduled for insomnia  819>>CT angio head/neck: No LVO-no significant stenosis in neck. 8/19>>CT angio chest/abdomen/pelvis: No acute vascular abnormality, 2 supraumbilical ventral wall hernias, 7 mm lung nodule, 2 cm left adrenal nodule 8/19>>spot EEG: no seizure-like activity 8/20>>MRI brain: No acute intracranial abnormality. 8/21-09/2020>>LTM YPP:[id number] events recorded-without concomitant EEG changes-nonepileptic episodes.  EKG 10/2020: Date: 11/04/2020 Rhythm: Sinus tachycardia QRS Axis: normal Intervals: normal ST/T Wave abnormalities: normal Narrative Interpretation: no evidence of acute ischemia   Pt tolerated emergent incarcerated hernia repair in 05/2020.   Past Medical History: No date: Anemia No date: Anginal pain ([place]) No date: Anxiety No date: Arthritis No date: Asthma No date: Bilateral lower extremity edema No date: Bipolar disorder (HCC) unk: CAD (coronary artery disease) No date: CHF (congestive heart failure) (HCC) No date: Chronic pain syndrome No date: COPD (chronic obstructive pulmonary disease) (HCC)     Comment:  emphysema No date: DDD (degenerative disc disease), lumbar unk: Depression No date:  Diabetes mellitus without complication ([place]) No date: Diabetes  mellitus, type II ([place]) 2015: Drug overdose     Comment:  after dad died No date: Dysrhythmia     Comment:  history of tachy arrythmias No date: Fibromyalgia No date: GERD (gastroesophageal reflux disease) No date: Headache     Comment:  chronic migraines No date: Hyperlipidemia No date: Hypertension No date: Hypothyroidism 04/29/2014: Left leg pain 2007: MI (myocardial infarction) ([place]) 09/16/2014: Muscle ache No date: Osteoporosis No date: Overactive bladder unk: Pancreatitis No date: Panic attacks No date: PTSD (post-traumatic stress disorder) No date: Reflex sympathetic dystrophy No date: Renal cyst, left No date: Renal insufficiency     Comment:  ckd stage iii No date: Restless legs syndrome 06/2019: Sleep apnea     Comment:  uses cpap No date: Sleep apnea 2010: Stroke [facility])     Comment:  TIA x 2. no residual deficits No date: Suicide attempt [facility])     Comment:  after father died. No date: Thyroid disease     Comment:  thyroid nodule unk: TIA (transient ischemic attack) No date: TIA (transient ischemic attack)   Reproductive/Obstetrics                           Anesthesia Physical  Anesthesia Plan  ASA: III  Anesthesia Plan: General   Post-op Pain Management:    Induction: Intravenous  PONV Risk Score and Plan: 2 and Ondansetron and Dexamethasone  Airway Management Planned: Oral ETT  Additional Equipment:   Intra-op Plan:   Post-operative Plan: Extubation in OR  Informed Consent: I have reviewed the patients History and Physical, chart, labs and discussed the procedure including the risks, benefits and alternatives for the proposed anesthesia with the patient or authorized representative who has indicated his/her understanding and acceptance.     Dental advisory given  Plan Discussed with: CRNA and Anesthesiologist  Anesthesia Plan Comments: (Allergy to  diazepam, pt denies problems with other benzodiazepines)      Anesthesia Quick Evaluation

## 2020-11-07 ENCOUNTER — Encounter: Payer: Self-pay | Admitting: Neurosurgery

## 2020-11-07 ENCOUNTER — Other Ambulatory Visit: Payer: Self-pay

## 2020-11-07 ENCOUNTER — Encounter
Admission: RE | Disposition: A | Discharge status: Home / Self Care | Payer: Self-pay | Source: Home / Self Care | Attending: Neurosurgery

## 2020-11-07 ENCOUNTER — Ambulatory Visit
Admission: RE | Admit: 2020-11-07 | Discharge: 2020-11-07 | Disposition: A | Discharge status: Home / Self Care | Payer: Medicare Other | Attending: Neurosurgery | Admitting: Neurosurgery

## 2020-11-07 ENCOUNTER — Ambulatory Visit: Payer: Medicare Other | Admitting: Anesthesiology

## 2020-11-07 DIAGNOSIS — Z881 Allergy status to other antibiotic agents status: Secondary | ICD-10-CM | POA: Diagnosis not present

## 2020-11-07 DIAGNOSIS — Z888 Allergy status to other drugs, medicaments and biological substances status: Secondary | ICD-10-CM | POA: Insufficient documentation

## 2020-11-07 DIAGNOSIS — Z85820 Personal history of malignant melanoma of skin: Secondary | ICD-10-CM | POA: Insufficient documentation

## 2020-11-07 DIAGNOSIS — Z7951 Long term (current) use of inhaled steroids: Secondary | ICD-10-CM | POA: Diagnosis not present

## 2020-11-07 DIAGNOSIS — T85840A Pain due to nervous system prosthetic devices, implants and grafts, initial encounter: Secondary | ICD-10-CM | POA: Insufficient documentation

## 2020-11-07 DIAGNOSIS — Z87891 Personal history of nicotine dependence: Secondary | ICD-10-CM | POA: Insufficient documentation

## 2020-11-07 DIAGNOSIS — G894 Chronic pain syndrome: Secondary | ICD-10-CM | POA: Insufficient documentation

## 2020-11-07 DIAGNOSIS — Y831 Surgical operation with implant of artificial internal device as the cause of abnormal reaction of the patient, or of later complication, without mention of misadventure at the time of the procedure: Secondary | ICD-10-CM | POA: Insufficient documentation

## 2020-11-07 DIAGNOSIS — Z79890 Hormone replacement therapy (postmenopausal): Secondary | ICD-10-CM | POA: Diagnosis not present

## 2020-11-07 DIAGNOSIS — Z882 Allergy status to sulfonamides status: Secondary | ICD-10-CM | POA: Insufficient documentation

## 2020-11-07 DIAGNOSIS — Z880 Allergy status to penicillin: Secondary | ICD-10-CM | POA: Diagnosis not present

## 2020-11-07 DIAGNOSIS — Z79899 Other long term (current) drug therapy: Secondary | ICD-10-CM | POA: Insufficient documentation

## 2020-11-07 DIAGNOSIS — Z8673 Personal history of transient ischemic attack (TIA), and cerebral infarction without residual deficits: Secondary | ICD-10-CM | POA: Insufficient documentation

## 2020-11-07 HISTORY — PX: SPINAL CORD STIMULATOR REMOVAL: SHX5379

## 2020-11-07 LAB — POCT I-STAT, CHEM 8
BUN: 14 mg/dL (ref 6–20)
Calcium, Ion: 1.22 mmol/L (ref 1.15–1.40)
Chloride: 101 mmol/L (ref 98–111)
Creatinine, Ser: 0.8 mg/dL (ref 0.44–1.00)
Glucose, Bld: 171 mg/dL — ABNORMAL HIGH (ref 70–99)
HCT: 32 % — ABNORMAL LOW (ref 36.0–46.0)
Hemoglobin: 10.9 g/dL — ABNORMAL LOW (ref 12.0–15.0)
Potassium: 4.1 mmol/L (ref 3.5–5.1)
Sodium: 139 mmol/L (ref 135–145)
TCO2: 28 mmol/L (ref 22–32)

## 2020-11-07 LAB — GLUCOSE, CAPILLARY
Glucose-Capillary: 155 mg/dL — ABNORMAL HIGH (ref 70–99)
Glucose-Capillary: 154 mg/dL — ABNORMAL HIGH (ref 70–99)

## 2020-11-07 SURGERY — LUMBAR SPINAL CORD STIMULATOR REMOVAL
Anesthesia: General

## 2020-11-07 MED ORDER — FENTANYL CITRATE (PF) 100 MCG/2ML IJ SOLN
INTRAMUSCULAR | Status: DC | PRN
  Administered 2020-11-07 (×2): 50 ug via INTRAVENOUS

## 2020-11-07 MED ORDER — FENTANYL CITRATE (PF) 100 MCG/2ML IJ SOLN
INTRAMUSCULAR | Status: AC
  Filled 2020-11-07: qty 2

## 2020-11-07 MED ORDER — ROCURONIUM BROMIDE 100 MG/10ML IV SOLN
INTRAVENOUS | Status: DC | PRN
  Administered 2020-11-07: 25 mg via INTRAVENOUS

## 2020-11-07 MED ORDER — SODIUM CHLORIDE 0.9 % IV SOLN: INTRAVENOUS | Status: DC

## 2020-11-07 MED ORDER — BUPIVACAINE-EPINEPHRINE (PF) 0.5% -1:200000 IJ SOLN
INTRAMUSCULAR | Status: DC | PRN
  Administered 2020-11-07: 20 mL

## 2020-11-07 MED ORDER — OXYCODONE HCL 5 MG PO TABS
ORAL_TABLET | ORAL | Status: AC
  Filled 2020-11-07: qty 1

## 2020-11-07 MED ORDER — ORAL CARE MOUTH RINSE: 15.0000 mL | Freq: Once | OROMUCOSAL | Status: AC

## 2020-11-07 MED ORDER — ACETAMINOPHEN 10 MG/ML IV SOLN
INTRAVENOUS | Status: DC | PRN
  Administered 2020-11-07: 1000 mg via INTRAVENOUS

## 2020-11-07 MED ORDER — DEXAMETHASONE SODIUM PHOSPHATE 10 MG/ML IJ SOLN
INTRAMUSCULAR | Status: AC
  Filled 2020-11-07: qty 1

## 2020-11-07 MED ORDER — OXYCODONE HCL 5 MG/5ML PO SOLN: 5.0000 mg | Freq: Once | ORAL | Status: AC | PRN

## 2020-11-07 MED ORDER — CHLORHEXIDINE GLUCONATE 0.12 % MT SOLN: 15.0000 mL | Freq: Once | OROMUCOSAL | Status: AC

## 2020-11-07 MED ORDER — PROPOFOL 10 MG/ML IV BOLUS
INTRAVENOUS | Status: AC
  Filled 2020-11-07: qty 20

## 2020-11-07 MED ORDER — FENTANYL CITRATE (PF) 100 MCG/2ML IJ SOLN: 25.0000 ug | INTRAMUSCULAR | Status: DC | PRN

## 2020-11-07 MED ORDER — LIDOCAINE HCL (CARDIAC) PF 100 MG/5ML IV SOSY
PREFILLED_SYRINGE | INTRAVENOUS | Status: DC | PRN
  Administered 2020-11-07: 100 mg via INTRAVENOUS

## 2020-11-07 MED ORDER — ONDANSETRON HCL 4 MG/2ML IJ SOLN
INTRAMUSCULAR | Status: AC
  Filled 2020-11-07: qty 2

## 2020-11-07 MED ORDER — SUCCINYLCHOLINE CHLORIDE 200 MG/10ML IV SOSY
PREFILLED_SYRINGE | INTRAVENOUS | Status: AC
  Filled 2020-11-07: qty 10

## 2020-11-07 MED ORDER — CEFAZOLIN SODIUM-DEXTROSE 2-4 GM/100ML-% IV SOLN
2.0000 g | Freq: Once | INTRAVENOUS | Status: AC
  Administered 2020-11-07: 2 g via INTRAVENOUS

## 2020-11-07 MED ORDER — SUGAMMADEX SODIUM 200 MG/2ML IV SOLN
INTRAVENOUS | Status: DC | PRN
  Administered 2020-11-07: 200 mg via INTRAVENOUS

## 2020-11-07 MED ORDER — PROMETHAZINE HCL 25 MG/ML IJ SOLN
6.2500 mg | INTRAMUSCULAR | Status: DC | PRN
Start: 2020-11-07 — End: 2020-11-07

## 2020-11-07 MED ORDER — ACETAMINOPHEN 10 MG/ML IV SOLN
INTRAVENOUS | Status: AC
  Filled 2020-11-07: qty 100

## 2020-11-07 MED ORDER — ROCURONIUM BROMIDE 10 MG/ML (PF) SYRINGE
PREFILLED_SYRINGE | INTRAVENOUS | Status: AC
  Filled 2020-11-07: qty 10

## 2020-11-07 MED ORDER — LACTATED RINGERS IV SOLN: INTRAVENOUS | Status: DC | PRN

## 2020-11-07 MED ORDER — HYDROCODONE-ACETAMINOPHEN 5-325 MG PO TABS: 1.0000 | ORAL_TABLET | Freq: Four times a day (QID) | ORAL | 0 refills | Status: AC | PRN

## 2020-11-07 MED ORDER — DEXAMETHASONE SODIUM PHOSPHATE 10 MG/ML IJ SOLN
INTRAMUSCULAR | Status: DC | PRN
  Administered 2020-11-07: 5 mg via INTRAVENOUS

## 2020-11-07 MED ORDER — OXYCODONE HCL 5 MG PO TABS
5.0000 mg | ORAL_TABLET | Freq: Once | ORAL | Status: AC | PRN
  Administered 2020-11-07: 5 mg via ORAL

## 2020-11-07 MED ORDER — MIDAZOLAM HCL 2 MG/2ML IJ SOLN
INTRAMUSCULAR | Status: AC
  Filled 2020-11-07: qty 2

## 2020-11-07 MED ORDER — PROPOFOL 10 MG/ML IV BOLUS
INTRAVENOUS | Status: DC | PRN
  Administered 2020-11-07: 160 mg via INTRAVENOUS

## 2020-11-07 MED ORDER — CHLORHEXIDINE GLUCONATE 0.12 % MT SOLN
OROMUCOSAL | Status: AC
  Administered 2020-11-07: 15 mL via OROMUCOSAL
  Filled 2020-11-07: qty 15

## 2020-11-07 MED ORDER — PHENYLEPHRINE HCL (PRESSORS) 10 MG/ML IV SOLN
INTRAVENOUS | Status: AC
  Filled 2020-11-07: qty 1

## 2020-11-07 MED ORDER — SUCCINYLCHOLINE CHLORIDE 200 MG/10ML IV SOSY
PREFILLED_SYRINGE | INTRAVENOUS | Status: DC | PRN
  Administered 2020-11-07: 100 mg via INTRAVENOUS

## 2020-11-07 MED ORDER — PHENYLEPHRINE HCL (PRESSORS) 10 MG/ML IV SOLN
INTRAVENOUS | Status: DC | PRN
  Administered 2020-11-07 (×2): 100 ug via INTRAVENOUS

## 2020-11-07 MED ORDER — MIDAZOLAM HCL 2 MG/2ML IJ SOLN
INTRAMUSCULAR | Status: DC | PRN
  Administered 2020-11-07: 2 mg via INTRAVENOUS

## 2020-11-07 MED ORDER — CEFAZOLIN SODIUM-DEXTROSE 2-4 GM/100ML-% IV SOLN
INTRAVENOUS | Status: AC
  Filled 2020-11-07: qty 100

## 2020-11-07 MED ORDER — ONDANSETRON HCL 4 MG/2ML IJ SOLN
INTRAMUSCULAR | Status: DC | PRN
  Administered 2020-11-07: 4 mg via INTRAVENOUS

## 2020-11-07 MED ORDER — ACETAMINOPHEN 10 MG/ML IV SOLN: 1000.0000 mg | Freq: Once | INTRAVENOUS | Status: DC | PRN

## 2020-11-07 MED ORDER — 0.9 % SODIUM CHLORIDE (POUR BTL) OPTIME
TOPICAL | Status: DC | PRN
  Administered 2020-11-07: 1000 mL

## 2020-11-07 SURGICAL SUPPLY — 69 items
ADH SKN CLS APL DERMABOND .7 (GAUZE/BANDAGES/DRESSINGS) ×1
AGENT HMST KT MTR STRL THRMB (HEMOSTASIS)
APL PRP STRL LF DISP 70% ISPRP (MISCELLANEOUS) ×1
APL SRG 60D 8 XTD TIP BNDBL (TIP)
BLADE BOVIE TIP EXT 4 (BLADE) IMPLANT
BUR NEURO DRILL SOFT 3.0X3.8M (BURR) IMPLANT
CHLORAPREP W/TINT 26 (MISCELLANEOUS) ×2 IMPLANT
CNTNR SPEC 2.5X3XGRAD LEK (MISCELLANEOUS) ×1
CONT SPEC 4OZ STER OR WHT (MISCELLANEOUS) ×1
CONT SPEC 4OZ STRL OR WHT (MISCELLANEOUS) ×1
CONTAINER SPEC 2.5X3XGRAD LEK (MISCELLANEOUS) ×1 IMPLANT
COUNTER NEEDLE 20/40 LG (NEEDLE) ×2 IMPLANT
COVER LIGHT HANDLE STERIS (MISCELLANEOUS) ×4 IMPLANT
CUP MEDICINE 2OZ PLAST GRAD ST (MISCELLANEOUS) ×2 IMPLANT
DERMABOND ADVANCED (GAUZE/BANDAGES/DRESSINGS) ×1
DERMABOND ADVANCED .7 DNX12 (GAUZE/BANDAGES/DRESSINGS) ×1 IMPLANT
DEVICE DSSCT PLSMBLD 3.0S LGHT (MISCELLANEOUS) IMPLANT
DRAPE C-ARM XRAY 36X54 (DRAPES) IMPLANT
DRAPE LAPAROTOMY 100X77 ABD (DRAPES) ×2 IMPLANT
DRAPE MICROSCOPE SPINE 48X150 (DRAPES) IMPLANT
DRAPE SURG 17X11 SM STRL (DRAPES) ×2 IMPLANT
DRSG TEGADERM 4X4.75 (GAUZE/BANDAGES/DRESSINGS) ×2 IMPLANT
DRSG TELFA 4X3 1S NADH ST (GAUZE/BANDAGES/DRESSINGS) ×2 IMPLANT
DURASEAL APPLICATOR TIP (TIP) IMPLANT
DURASEAL SPINE SEALANT 3ML (MISCELLANEOUS) IMPLANT
ELECT CAUTERY BLADE TIP 2.5 (TIP)
ELECT EZSTD 165MM 6.5IN (MISCELLANEOUS) ×2
ELECT REM PT RETURN 9FT ADLT (ELECTROSURGICAL) ×2
ELECTRODE CAUTERY BLDE TIP 2.5 (TIP) IMPLANT
ELECTRODE EZSTD 165MM 6.5IN (MISCELLANEOUS) ×1 IMPLANT
ELECTRODE REM PT RTRN 9FT ADLT (ELECTROSURGICAL) ×1 IMPLANT
GAUZE 4X4 16PLY ~~LOC~~+RFID DBL (SPONGE) ×4 IMPLANT
GAUZE SPONGE 4X4 12PLY STRL (GAUZE/BANDAGES/DRESSINGS) IMPLANT
GAUZE XEROFORM 1X8 LF (GAUZE/BANDAGES/DRESSINGS) ×2 IMPLANT
GLOVE SRG 8 PF TXTR STRL LF DI (GLOVE) ×1 IMPLANT
GLOVE SURG SYN 6.5 ES PF (GLOVE) ×4 IMPLANT
GLOVE SURG SYN 8.0 (GLOVE) ×2 IMPLANT
GLOVE SURG UNDER POLY LF SZ6.5 (GLOVE) ×4 IMPLANT
GLOVE SURG UNDER POLY LF SZ8 (GLOVE) ×2
GOWN SRG LRG LVL 4 IMPRV REINF (GOWNS) ×2 IMPLANT
GOWN STRL REIN LRG LVL4 (GOWNS) ×4
GOWN STRL REUS W/ TWL XL LVL3 (GOWN DISPOSABLE) ×2 IMPLANT
GOWN STRL REUS W/TWL XL LVL3 (GOWN DISPOSABLE) ×4
GRADUATE 1200CC STRL 31836 (MISCELLANEOUS) ×2 IMPLANT
KIT TURNOVER KIT A (KITS) ×2 IMPLANT
KIT WILSON FRAME (KITS) ×2 IMPLANT
MANIFOLD NEPTUNE II (INSTRUMENTS) ×2 IMPLANT
MARKER SKIN DUAL TIP RULER LAB (MISCELLANEOUS) ×2 IMPLANT
NDL SAFETY ECLIPSE 18X1.5 (NEEDLE) ×1 IMPLANT
NEEDLE HYPO 18GX1.5 SHARP (NEEDLE) ×2
NEEDLE HYPO 22GX1.5 SAFETY (NEEDLE) ×2 IMPLANT
NS IRRIG 1000ML POUR BTL (IV SOLUTION) ×2 IMPLANT
PACK LAMINECTOMY NEURO (CUSTOM PROCEDURE TRAY) ×2 IMPLANT
PAD ARMBOARD 7.5X6 YLW CONV (MISCELLANEOUS) ×2 IMPLANT
PLASMABLADE 3.0S W/LIGHT (MISCELLANEOUS)
STAPLER SKIN PROX 35W (STAPLE) IMPLANT
SURGIFLO W/THROMBIN 8M KIT (HEMOSTASIS) IMPLANT
SUT ETHILON 3-0 FS-10 30 BLK (SUTURE) ×2
SUT POLYSORB 2-0 5X18 GS-10 (SUTURE) ×10 IMPLANT
SUT VIC AB 0 CT1 18XCR BRD 8 (SUTURE) ×2 IMPLANT
SUT VIC AB 0 CT1 8-18 (SUTURE) ×4
SUTURE EHLN 3-0 FS-10 30 BLK (SUTURE) ×1 IMPLANT
SYR 10ML LL (SYRINGE) ×4 IMPLANT
SYR 20ML LL LF (SYRINGE) ×2 IMPLANT
SYR 30ML LL (SYRINGE) ×4 IMPLANT
SYR 3ML LL SCALE MARK (SYRINGE) ×2 IMPLANT
TOWEL OR 17X26 4PK STRL BLUE (TOWEL DISPOSABLE) ×4 IMPLANT
TUBING CONNECTING 10 (TUBING) ×2 IMPLANT
WATER STERILE IRR 500ML POUR (IV SOLUTION) ×2 IMPLANT

## 2020-11-07 NOTE — Transfer of Care (Signed)
Immediate Anesthesia Transfer of Care Note  Patient: Marie Clarke  Procedure(s) Performed: REMOVAL OF THORACIC SPINAL CORD STIMULATOR & PULSE GENERATOR  Patient Location: PACU  Anesthesia Type:General  Level of Consciousness: awake, alert  and oriented  Airway & Oxygen Therapy: Patient Spontanous Breathing and Patient connected to nasal cannula oxygen  Post-op Assessment: Report given to RN and Post -op Vital signs reviewed and stable  Post vital signs: Reviewed and stable  Last Vitals:  Vitals Value Taken Time  BP 107/75 11/07/20 0832  Temp    Pulse 77 11/07/20 0835  Resp 19 11/07/20 0835  SpO2 100 % 11/07/20 0835  Vitals shown include unvalidated device data.  Last Pain:  Vitals:   11/07/20 0639  TempSrc: Temporal  PainSc: 6          Complications: No notable events documented.

## 2020-11-07 NOTE — Discharge Instructions (Addendum)
NEUROSURGERY DISCHARGE INSTRUCTIONS  Admission diagnosis: chronic pain syndrome g89.4  Operative procedure: Removal of Spinal cord stimulator  What to do after you leave the hospital:  Recommended diet: diabetic diet. Increase protein intake to promote wound healing.  Recommended activity: no lifting, driving, or strenuous exercise for 4 weeks . You should walk multiple times per day  Special Instructions  No straining, no heavy lifting > 10lbs x 4 weeks.  Keep incision area clean and dry. May shower in 2 days. No baths or pools for 6 weeks.  Please remove dressing tomorrow, no need to apply a bandage afterwards  You have sutures on your midline incision that will be removed in clinic about 2 weeks. The right flank incision was closed with skin glue. This will come off on its own in 10-14 days. Do not soak or scrub incisions. Pat dry after showing.    Please take pain medications as directed. Take a stool softener if on pain medications  You may resume your Aspirin in 2 weeks. Hold all NSAID medication for 3 days post-op.  Please Report any of the following: Nausea or Vomiting, Temperature is greater than 101.61F (38.1C) degrees, Dizziness, Abdominal Pain, Difficulty Breathing or Shortness of Breath, Inability to Eat, drink Fluids, or Take medications, Bleeding, swelling, or drainage from surgical incision sites, New numbness or weakness, and Bowel or bladder dysfunction to the neurosurgeon on call at [phone]  Additional Follow up appointments Please follow up with Cooper Render in [place] at [place] [facility] as scheduled in 2-3 weeks   Please see below for scheduled appointments:  Future Appointments  Date Time Provider [place]  11/08/2020 10:00 AM Kris Hartmann, NP [facility] None     AMBULATORY SURGERY  DISCHARGE INSTRUCTIONS   The drugs that you were given will stay in your system until tomorrow so for the next 24 hours you should not:  Drive an automobile Make any  legal decisions Drink any alcoholic beverage   You may resume regular meals tomorrow.  Today it is better to start with liquids and gradually work up to solid foods.  You may eat anything you prefer, but it is better to start with liquids, then soup and crackers, and gradually work up to solid foods.   Please notify your doctor immediately if you have any unusual bleeding, trouble breathing, redness and pain at the surgery site, drainage, fever, or pain not relieved by medication.    Additional Instructions:        Please contact your physician with any problems or Same Day Surgery at [phone], Monday through Friday 6 am to 4 pm, or [place] at [facility] number at [phone].

## 2020-11-07 NOTE — Op Note (Signed)
Operative Note   SURGERY DATE:  11/07/2020   PRE-OP DIAGNOSIS: Chronic Pain Syndrome (G89.4)   POST-OP DIAGNOSIS:  Chronic Pain Syndrome (G89.4)   Procedure(s) with comments: Removal Spinal Cord Stimulator and Right Flank Pulse Generator     SURGEON:     * Malen Gauze, MD       Cooper Render, PA, Assistant   ANESTHESIA: IV anesthetic, local   OPERATIVE FINDINGS: Failed SCS with painful implant   Indications: [name] underwent a SCS placement earlier but has had difficulty with pain relief and at this point verbalizes no significant benefit and the device is painful in its location. Given this, she wished to proceed with removal. The risks including hematoma, infection, damage to spinal cord were discussed. The patient elected to proceed with the surgery   Procedure:  The patient was brought to the operating room and placed prone on a Wilson frame after intuabtion. The anesthesia service had vascular access and provided general anesthesia to the patient.Antibiotic was given.  The Right flank and lumbar incisions were prepped and draped in a sterile fashion.  A hard timeout was performed.  Local anesthetic was instilled into the incisions.    The lumbar incision was opened sharply and the wires identified. These were followed until the anchors going into fascia. The anchors were freed and then the wires gently pulled and removed intact. Next, the wires were cut distally.    The right flank incision was opened sharply to expose the wires and the pulse generator and this was removed. The leads were seen to be intact and connected.    Hemostasis was obtained and the incisions irrigated with saline.     The deep tissue was closed with 0 Vicryl and subcutaneous with 2-0 Vicryl.  The skin was closed with 3-0 Nylon in lumbar area and Dermabond on flank.  A dressing was applied.  Patient was returned to the supine position and sedation medications were stopped and the patient was awoken  from anesthesia.  Patient was taken to the PACU for further recovery and seen to be at neurologic baseline.  I discussed the results with the family            ESTIMATED BLOOD LOSS:   20 cc         I performed the case in its entirety with assistance of Cooper Render, [place]   [name], [place]

## 2020-11-07 NOTE — Discharge Summary (Signed)
Physician Discharge Summary  Patient ID: Marie Clarke MRN: [id number] DOB/AGE: April 10, 1970 50 y.o.  Admit date: 11/07/2020 Discharge date: 11/07/2020  Admission Diagnoses: Chronic Pain  Discharge Diagnoses:  Active Problems:   * No active hospital problems. *   Discharged Condition: good  Hospital Course: Marie Clarke is a 50 y.o presenting for removal of SCS. Her intraoperative course was uncomplicated. She was monitored in PACU and discharged home after ambulating, urinating, and tolerating PO intake. She was given medications as needed for pain. She was scheduled for a follow up in 2 weeks for suture removal.  Consults: None  Significant Diagnostic Studies: None  Treatments: surgery: See above  Discharge Exam: Blood pressure 106/67, pulse 81, temperature (!) 97.2 F (36.2 C), temperature source Temporal, SpO2 98 %. CN II-XII grossly intact Incision c/d/I with post-op bandage in place Good and equal strength throughout  Disposition: Discharge disposition: 01-Home or Self Care        Allergies as of 11/07/2020       Reactions   Diazepam Hives, Nausea And Vomiting   Ziprasidone Hcl Itching, Other (See Comments)   Confusion   Azithromycin Hives   Divalproex Sodium Hives   Levofloxacin Hives, Rash   1/31: would like to retry since not taking valium   Lisinopril Other (See Comments)   Medicine cause kidney failure   Metronidazole Hives   1/31: would like to retry since she is no longer taking valium   Sulfa Antibiotics Hives   Valproic Acid Itching, Rash   Cephalexin Hives   Ciprofloxacin Hives   Doxycycline Rash   Penicillins Hives   Has patient had a PCN reaction causing immediate rash, facial/tongue/throat swelling, SOB or lightheadedness with hypotension: No Has patient had a PCN reaction causing severe rash involving mucus membranes or skin necrosis: No Has patient had a PCN reaction that required hospitalization: No Has patient had a PCN reaction occurring  within the last 10 years: No If all of the above answers are "NO", then may proceed with Cephalosporin use.        Medication List     STOP taking these medications    aspirin EC 81 MG tablet   hydrochlorothiazide 12.5 MG capsule Commonly known as: MICROZIDE   naproxen 500 MG tablet Commonly known as: NAPROSYN       TAKE these medications    atorvastatin 40 MG tablet Commonly known as: LIPITOR Take 40 mg by mouth at bedtime.   Caplyta 42 MG capsule Generic drug: lumateperone tosylate Take 42 mg by mouth at bedtime.   cholecalciferol 25 MCG (1000 UNIT) tablet Commonly known as: VITAMIN D3 Take 1,000 Units by mouth daily.   clonazepam 0.125 MG disintegrating tablet Commonly known as: KLONOPIN Take 0.125 mg by mouth 3 (three) times daily.   cyanocobalamin 1000 MCG tablet Take 1,000 mcg by mouth daily.   Daliresp 500 MCG Tabs tablet Generic drug: roflumilast Take 500 mcg by mouth daily.   famotidine 20 MG tablet Commonly known as: PEPCID Take 40 mg by mouth at bedtime.   furosemide 80 MG tablet Commonly known as: LASIX Take 80 mg by mouth 2 (two) times daily.   gabapentin 300 MG capsule Commonly known as: NEURONTIN Take 300-600 mg by mouth See admin instructions. Take 300 mg in the morning and noon and 600 mg at bedtime   HYDROcodone-acetaminophen 5-325 MG tablet Commonly known as: Norco Take 1 tablet by mouth every 6 (six) hours as needed for up to 5 days (pain).  hydrOXYzine 50 MG tablet Commonly known as: ATARAX/VISTARIL Take 1 tablet (50 mg total) by mouth at bedtime.   hydrOXYzine 25 MG tablet Commonly known as: ATARAX/VISTARIL Take 1 tablet (25 mg total) by mouth 2 (two) times daily as needed for anxiety.   levocetirizine 5 MG tablet Commonly known as: XYZAL Take 5 mg by mouth daily.   levothyroxine 112 MCG tablet Commonly known as: SYNTHROID Take 224 mcg by mouth daily before breakfast.   methocarbamol 750 MG tablet Commonly known  as: ROBAXIN Take 750 mg by mouth 3 (three) times daily.   metoprolol succinate 50 MG 24 hr tablet Commonly known as: TOPROL-XL Take 50 mg by mouth 2 (two) times daily.   montelukast 10 MG tablet Commonly known as: SINGULAIR Take 10 mg by mouth at bedtime.   multivitamin tablet Take 1 tablet by mouth daily.   nitroGLYCERIN 0.4 MG SL tablet Commonly known as: NITROSTAT Place 0.4 mg under the tongue every 5 (five) minutes as needed for chest pain.   nitroGLYCERIN 0.4 MG/SPRAY spray Commonly known as: NITROLINGUAL Place 1 spray under the tongue as needed for chest pain.   omeprazole 40 MG capsule Commonly known as: PRILOSEC Take 40 mg by mouth 2 (two) times daily.   orphenadrine 100 MG tablet Commonly known as: NORFLEX Take 1 tablet (100 mg total) by mouth 2 (two) times daily.   potassium chloride SA 20 MEQ tablet Commonly known as: KLOR-CON Take 40 mEq by mouth 2 (two) times daily.   ProAir HFA 108 (90 Base) MCG/ACT inhaler Generic drug: albuterol Inhale 2 puffs into the lungs every 6 (six) hours as needed for wheezing or shortness of breath.   promethazine 25 MG tablet Commonly known as: PHENERGAN Take 25 mg by mouth every 6 (six) hours as needed for nausea/vomiting.   rizatriptan 10 MG tablet Commonly known as: MAXALT Take 10 mg by mouth as needed for migraine.   rOPINIRole 1 MG tablet Commonly known as: REQUIP Take 1 mg by mouth 3 (three) times daily.   Symbicort 160-4.5 MCG/ACT inhaler Generic drug: budesonide-formoterol Inhale 2 puffs into the lungs 2 (two) times daily.   traZODone 50 MG tablet Commonly known as: DESYREL Take 50-150 mg by mouth at bedtime as needed for sleep.   Trulicity 2.68 TM/1.9QQ Sopn Generic drug: Dulaglutide Inject 0.75 mg into the skin every Thursday.   vitamin C 500 MG tablet Commonly known as: ASCORBIC ACID Take 500 mg by mouth daily.   vitamin E 180 MG (400 UNITS) capsule Take 400 Units by mouth daily.         Follow-up Information     Loleta Dicker, PA Follow up in 2 week(s).   Why: For suture removal and wound check. Appointment should already be scheduled through the office. Please call [facility] with any questions or concerns regarding appointment date and time. Contact information: [place] [address] [phone]                 Signed: Loleta Dicker 11/07/2020, 8:33 AM

## 2020-11-07 NOTE — Anesthesia Postprocedure Evaluation (Signed)
Anesthesia Post Note  Patient: Marie Clarke  Procedure(s) Performed: REMOVAL OF THORACIC SPINAL CORD STIMULATOR & PULSE GENERATOR  Patient location during evaluation: PACU Anesthesia Type: General Level of consciousness: awake and alert Pain management: pain level controlled Vital Signs Assessment: post-procedure vital signs reviewed and stable Respiratory status: spontaneous breathing, nonlabored ventilation and respiratory function stable Cardiovascular status: blood pressure returned to baseline and stable Postop Assessment: no apparent nausea or vomiting Anesthetic complications: no   No notable events documented.   Last Vitals:  Vitals:   11/07/20 0900 11/07/20 0911  BP: 103/68 99/72  Pulse: 78 70  Resp: 14 18  Temp: 36.4 C (!) 36 C  SpO2: 97% 99%    Last Pain:  Vitals:   11/07/20 0911  TempSrc: Temporal  PainSc: [place]

## 2020-11-07 NOTE — Anesthesia Procedure Notes (Signed)
Procedure Name: Intubation Date/Time: 11/07/2020 7:33 AM Performed by: Fredderick Phenix, CRNA Pre-anesthesia Checklist: Patient identified, Emergency Drugs available, Suction available and Patient being monitored Patient Re-evaluated:Patient Re-evaluated prior to induction Oxygen Delivery Method: Circle system utilized Preoxygenation: Pre-oxygenation with 100% oxygen Induction Type: IV induction Ventilation: Mask ventilation without difficulty Laryngoscope Size: Mac and 4 Grade View: Grade I Tube type: Oral Tube size: 7.0 mm Number of attempts: 1 Airway Equipment and Method: Stylet Placement Confirmation: ETT inserted through vocal cords under direct vision, positive ETCO2 and breath sounds checked- equal and bilateral Secured at: 21 cm Tube secured with: Tape Dental Injury: Teeth and Oropharynx as per pre-operative assessment

## 2020-11-07 NOTE — H&P (Signed)
Marie Clarke is an 50 y.o. female.   Chief Complaint: Failed SCS HPI: Marie Clarke underwent a thoracic stimulator placement in 202 and unfortunately has not gotten significant relief from this of her chronic pain. The device has also become painful and she requests removal.   Past Medical History:  Diagnosis Date   Anemia    Anginal pain (HCC)    Anxiety    Arthritis    Asthma    Bilateral lower extremity edema    Bipolar disorder (HCC)    CAD (coronary artery disease) unk   CHF (congestive heart failure) (HCC)    Chronic pain syndrome    COPD (chronic obstructive pulmonary disease) ([place])    emphysema   DDD (degenerative disc disease), lumbar    Depression unk   Diabetes mellitus without complication ([place])    Diabetes mellitus, type II ([place])    Drug overdose 2013-05-18   after dad died   Dysrhythmia    history of tachy arrythmias   Fibromyalgia    GERD (gastroesophageal reflux disease)    Headache    chronic migraines   Hyperlipidemia    Hypertension    Hypothyroidism    Left leg pain 2014/05/19   MI (myocardial infarction) ([place]) May 18, 2005   Muscle ache 09/16/2014   Osteoporosis    Overactive bladder    Pancreatitis unk   Panic attacks    PTSD (post-traumatic stress disorder)    Reflex sympathetic dystrophy    Renal cyst, left    Renal insufficiency    ckd stage iii   Restless legs syndrome    Sleep apnea 06/2019   uses cpap   Sleep apnea    Stroke ([place]) 18-May-2008   TIA x 2. no residual deficits   Suicide attempt [facility])    after father died.   Thyroid disease    thyroid nodule   TIA (transient ischemic attack) unk   TIA (transient ischemic attack)     Past Surgical History:  Procedure Laterality Date   ABDOMINAL HYSTERECTOMY     CARDIAC CATHETERIZATION     HERNIA REPAIR  07/15/2017   [facility]   lumbar facet     several   MELANOMA EXCISION  03/2020   OUTSIDE OF RECTUM   prolapse rectum surgery N/A 08/2014   THORACIC LAMINECTOMY FOR SPINAL CORD STIMULATOR N/A 07/27/2019    Procedure: THORACIC SPINAL CORD STIMULATOR PLACEMENT WITH RIGHT FLANK PULSE GENERATOR;  Surgeon: [name], MD;  Location: [facility] ORS;  Service: Neurosurgery;  Laterality: N/A;   TONSILLECTOMY      Family History  Problem Relation Age of Onset   Diabetes Mellitus II Mother    CAD Mother    Sleep apnea Mother    Osteoarthritis Mother    Osteoporosis Mother    Anxiety disorder Mother    Depression Mother    Bipolar disorder Mother    Bipolar disorder Father    Hypertension Father    Depression Father    Anxiety disorder Father    Post-traumatic stress disorder Sister    Social History:  reports that she quit smoking about 2 years ago. Her smoking use included cigarettes. She smoked an average of .5 packs per day. She has never used smokeless tobacco. She reports that she does not drink alcohol and does not use drugs.  Allergies:  Allergies  Allergen Reactions   Diazepam Hives and Nausea And Vomiting   Ziprasidone Hcl Itching and Other (See Comments)    Confusion    Azithromycin Hives  Divalproex Sodium Hives   Levofloxacin Hives and Rash    1/31: would like to retry since not taking valium   Lisinopril Other (See Comments)    Medicine cause kidney failure   Metronidazole Hives    1/31: would like to retry since she is no longer taking valium   Sulfa Antibiotics Hives   Valproic Acid Itching and Rash   Cephalexin Hives   Ciprofloxacin Hives   Doxycycline Rash   Penicillins Hives    Has patient had a PCN reaction causing immediate rash, facial/tongue/throat swelling, SOB or lightheadedness with hypotension: No Has patient had a PCN reaction causing severe rash involving mucus membranes or skin necrosis: No Has patient had a PCN reaction that required hospitalization: No Has patient had a PCN reaction occurring within the last 10 years: No If all of the above answers are "NO", then may proceed with Cephalosporin use.     Medications Prior to Admission  Medication Sig  Dispense Refill   aspirin EC 81 MG tablet Take 81 mg by mouth daily. Swallow whole.     atorvastatin (LIPITOR) 40 MG tablet Take 40 mg by mouth at bedtime.     CAPLYTA 42 MG capsule Take 42 mg by mouth at bedtime.     cholecalciferol (VITAMIN D3) 25 MCG (1000 UNIT) tablet Take 1,000 Units by mouth daily.     clonazepam (KLONOPIN) 0.125 MG disintegrating tablet Take 0.125 mg by mouth 3 (three) times daily.     cyanocobalamin 1000 MCG tablet Take 1,000 mcg by mouth daily.     DALIRESP 500 MCG TABS tablet Take 500 mcg by mouth daily.     Dulaglutide (TRULICITY) 0.93 AT/5.5DD SOPN Inject 0.75 mg into the skin every Thursday.     famotidine (PEPCID) 20 MG tablet Take 40 mg by mouth at bedtime.     furosemide (LASIX) 80 MG tablet Take 80 mg by mouth 2 (two) times daily.     gabapentin (NEURONTIN) 300 MG capsule Take 300-600 mg by mouth See admin instructions. Take 300 mg in the morning and noon and 600 mg at bedtime     hydrOXYzine (ATARAX/VISTARIL) 25 MG tablet Take 1 tablet (25 mg total) by mouth 2 (two) times daily as needed for anxiety. 30 tablet 0   hydrOXYzine (ATARAX/VISTARIL) 50 MG tablet Take 1 tablet (50 mg total) by mouth at bedtime. 30 tablet 0   levocetirizine (XYZAL) 5 MG tablet Take 5 mg by mouth daily.     levothyroxine (SYNTHROID) 112 MCG tablet Take 224 mcg by mouth daily before breakfast.     methocarbamol (ROBAXIN) 750 MG tablet Take 750 mg by mouth 3 (three) times daily.     metoprolol succinate (TOPROL-XL) 50 MG 24 hr tablet Take 50 mg by mouth 2 (two) times daily.     montelukast (SINGULAIR) 10 MG tablet Take 10 mg by mouth at bedtime.     Multiple Vitamin (MULTIVITAMIN) tablet Take 1 tablet by mouth daily.     naproxen (NAPROSYN) 500 MG tablet Take 500 mg by mouth 2 (two) times daily.     nitroGLYCERIN (NITROLINGUAL) 0.4 MG/SPRAY spray Place 1 spray under the tongue as needed for chest pain.     nitroGLYCERIN (NITROSTAT) 0.4 MG SL tablet Place 0.4 mg under the tongue every 5  (five) minutes as needed for chest pain.     omeprazole (PRILOSEC) 40 MG capsule Take 40 mg by mouth 2 (two) times daily.     orphenadrine (NORFLEX) 100 MG tablet Take  1 tablet (100 mg total) by mouth 2 (two) times daily. 60 tablet 0   potassium chloride SA (KLOR-CON) 20 MEQ tablet Take 40 mEq by mouth 2 (two) times daily.     PROAIR HFA 108 (90 Base) MCG/ACT inhaler Inhale 2 puffs into the lungs every 6 (six) hours as needed for wheezing or shortness of breath.     promethazine (PHENERGAN) 25 MG tablet Take 25 mg by mouth every 6 (six) hours as needed for nausea/vomiting.     rizatriptan (MAXALT) 10 MG tablet Take 10 mg by mouth as needed for migraine.     rOPINIRole (REQUIP) 1 MG tablet Take 1 mg by mouth 3 (three) times daily.     SYMBICORT 160-4.5 MCG/ACT inhaler Inhale 2 puffs into the lungs 2 (two) times daily.     traZODone (DESYREL) 50 MG tablet Take 50-150 mg by mouth at bedtime as needed for sleep.     vitamin C (ASCORBIC ACID) 500 MG tablet Take 500 mg by mouth daily.     vitamin E 180 MG (400 UNITS) capsule Take 400 Units by mouth daily.     hydrochlorothiazide (MICROZIDE) 12.5 MG capsule Take 12.5 mg by mouth daily. (Patient not taking: Reported on 11/07/2020)      Results for orders placed or performed during the hospital encounter of 11/07/20 (from the past 48 hour(s))  Glucose, capillary     Status: Abnormal   Collection Time: 11/07/20  6:20 AM  Result Value Ref Range   Glucose-Capillary 155 (H) 70 - 99 mg/dL    Comment: Glucose reference range applies only to samples taken after fasting for at least 8 hours.   No results found.  Review of Systems General ROS: Negative Respiratory ROS: Negative Cardiovascular ROS: Negative Gastrointestinal ROS: Negative Genito-Urinary ROS: Negative Musculoskeletal ROS: Positive for back pain Neurological ROS: Positive for leg pain Dermatological ROS: Negative    There were no vitals taken for this visit. Physical Exam  General  appearance: Alert, cooperative, in no acute distress Back: Well-healed puncture sites in the lumbar region  Neurologic exam:  Mental status: alertness: alert, affect: normal Speech: fluent and clear Motor:strength symmetric 5/5 in bilateral lower extremities Sensory: intact to light touch in bilateral lower extremities Gait: normal     Assessment/Plan Planned removal of thoracic SCS and pulse generator  [name], MD 11/07/2020, 6:38 AM

## 2020-11-07 NOTE — Interval H&P Note (Signed)
History and Physical Interval Note:  11/07/2020 6:43 AM  Marie Clarke  has presented today for surgery, with the diagnosis of chronic pain syndrome g89.4.  The various methods of treatment have been discussed with the patient and family. After consideration of risks, benefits and other options for treatment, the patient has consented to  Procedure(s): REMOVAL OF [place] (N/A) as a surgical intervention.  The patient's history has been reviewed, patient examined, no change in status, stable for surgery.  I have reviewed the patient's chart and labs.  Questions were answered to the patient's satisfaction.     [name]

## 2020-11-08 ENCOUNTER — Encounter: Payer: Self-pay | Admitting: Neurosurgery

## 2020-11-08 ENCOUNTER — Ambulatory Visit (INDEPENDENT_AMBULATORY_CARE_PROVIDER_SITE_OTHER): Payer: Medicare Other | Admitting: Nurse Practitioner

## 2020-11-11 NOTE — Progress Notes (Signed)
NO SHOW

## 2020-11-14 ENCOUNTER — Encounter (HOSPITAL_COMMUNITY): Payer: Self-pay | Admitting: Radiology

## 2020-11-15 ENCOUNTER — Encounter: Payer: Self-pay | Admitting: Pain Medicine

## 2020-12-02 IMAGING — DX DG ABD PORTABLE 1V
2 series · 2 of 2 positions shown · non-contrast
Comparison: None.

CLINICAL DATA: Delay for small-bowel obstruction

EXAM:
PORTABLE ABDOMEN - 1 VIEW

[abdomen supine (1 of 2)]
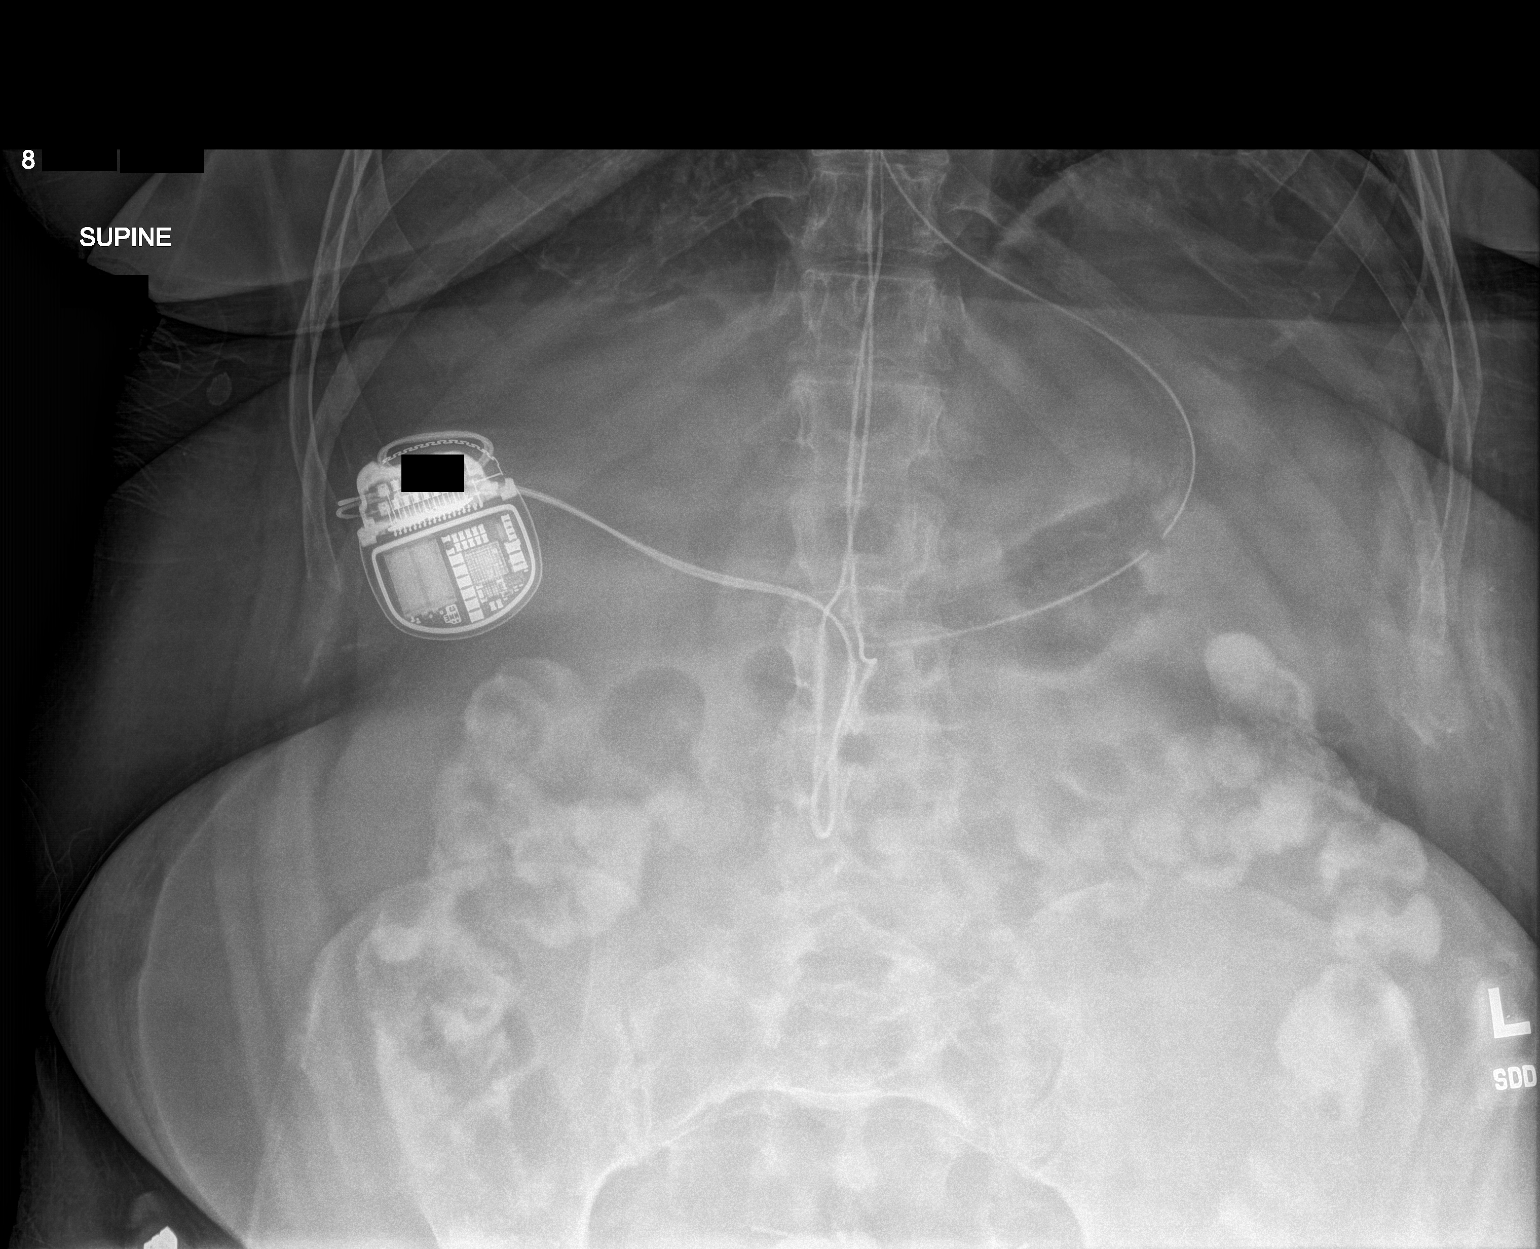

[abdomen supine (2 of 2)]
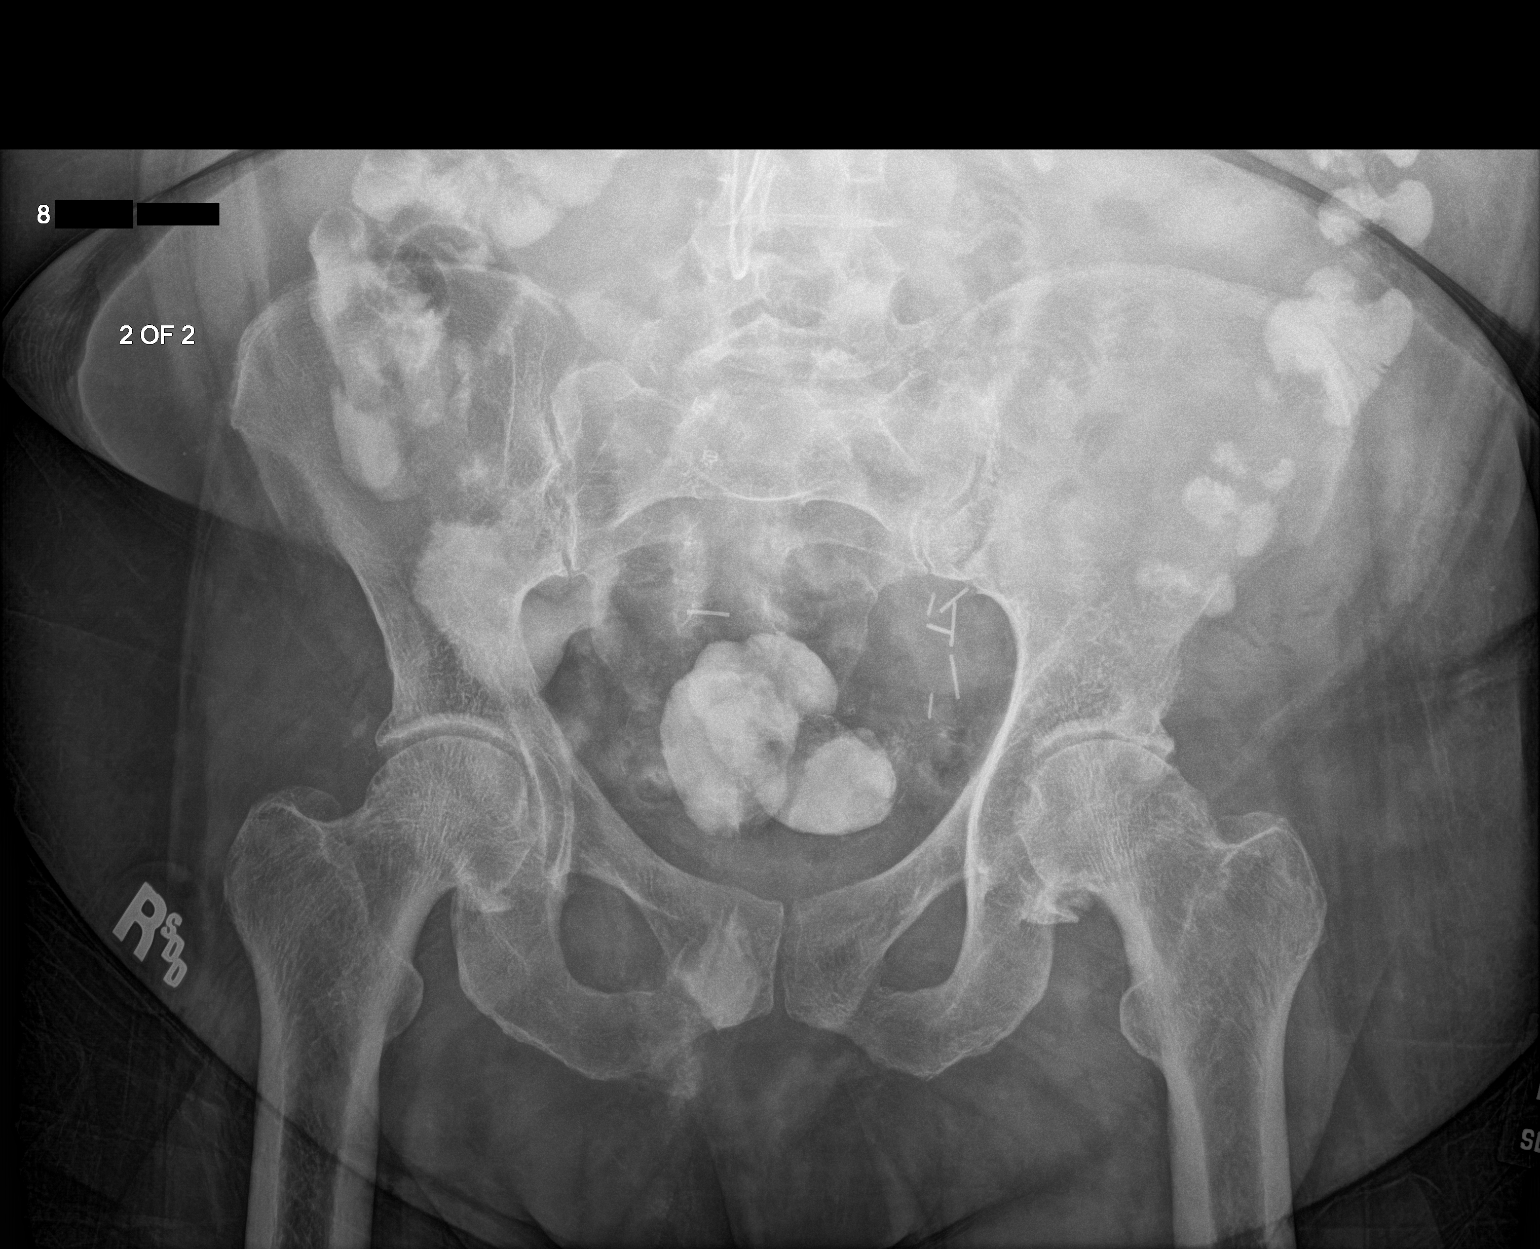

[2 of 2 positions shown; findings below may reference images not displayed]

FINDINGS: The bowel gas pattern is normal. Contrast is seen throughout the
colon to the level of the sigmoid rectal junction. NG tube is seen
within the mid body of the stomach. No radio-opaque calculi or other
significant radiographic abnormality are seen.
IMPRESSION: Contrast to level of the sigmoid rectal junction. No dilated loops
of bowel.

## 2020-12-16 ENCOUNTER — Other Ambulatory Visit: Payer: Self-pay

## 2020-12-16 ENCOUNTER — Emergency Department: Payer: Medicare Other

## 2020-12-16 DIAGNOSIS — I5032 Chronic diastolic (congestive) heart failure: Secondary | ICD-10-CM | POA: Insufficient documentation

## 2020-12-16 DIAGNOSIS — M542 Cervicalgia: Secondary | ICD-10-CM | POA: Diagnosis present

## 2020-12-16 DIAGNOSIS — Z7984 Long term (current) use of oral hypoglycemic drugs: Secondary | ICD-10-CM | POA: Diagnosis not present

## 2020-12-16 DIAGNOSIS — N183 Chronic kidney disease, stage 3 unspecified: Secondary | ICD-10-CM | POA: Insufficient documentation

## 2020-12-16 DIAGNOSIS — E039 Hypothyroidism, unspecified: Secondary | ICD-10-CM | POA: Insufficient documentation

## 2020-12-16 DIAGNOSIS — J45909 Unspecified asthma, uncomplicated: Secondary | ICD-10-CM | POA: Diagnosis not present

## 2020-12-16 DIAGNOSIS — N9489 Other specified conditions associated with female genital organs and menstrual cycle: Secondary | ICD-10-CM | POA: Insufficient documentation

## 2020-12-16 DIAGNOSIS — E1122 Type 2 diabetes mellitus with diabetic chronic kidney disease: Secondary | ICD-10-CM | POA: Diagnosis not present

## 2020-12-16 DIAGNOSIS — Z79899 Other long term (current) drug therapy: Secondary | ICD-10-CM | POA: Insufficient documentation

## 2020-12-16 DIAGNOSIS — Z87891 Personal history of nicotine dependence: Secondary | ICD-10-CM | POA: Diagnosis not present

## 2020-12-16 DIAGNOSIS — I130 Hypertensive heart and chronic kidney disease with heart failure and stage 1 through stage 4 chronic kidney disease, or unspecified chronic kidney disease: Secondary | ICD-10-CM | POA: Insufficient documentation

## 2020-12-16 DIAGNOSIS — I2510 Atherosclerotic heart disease of native coronary artery without angina pectoris: Secondary | ICD-10-CM | POA: Diagnosis not present

## 2020-12-16 DIAGNOSIS — J449 Chronic obstructive pulmonary disease, unspecified: Secondary | ICD-10-CM | POA: Diagnosis not present

## 2020-12-16 DIAGNOSIS — E1165 Type 2 diabetes mellitus with hyperglycemia: Secondary | ICD-10-CM | POA: Diagnosis not present

## 2020-12-16 LAB — CBC WITH DIFFERENTIAL/PLATELET
Abs Immature Granulocytes: 0.03 10*3/uL (ref 0.00–0.07)
Basophils Absolute: 0 10*3/uL (ref 0.0–0.1)
Basophils Relative: 0 %
Eosinophils Absolute: 0.1 10*3/uL (ref 0.0–0.5)
Eosinophils Relative: 1 %
HCT: 34.3 % — ABNORMAL LOW (ref 36.0–46.0)
Hemoglobin: 11.2 g/dL — ABNORMAL LOW (ref 12.0–15.0)
Immature Granulocytes: 0 %
Lymphocytes Relative: 24 %
Lymphs Abs: 1.9 10*3/uL (ref 0.7–4.0)
MCH: 29.2 pg (ref 26.0–34.0)
MCHC: 32.7 g/dL (ref 30.0–36.0)
MCV: 89.6 fL (ref 80.0–100.0)
Monocytes Absolute: 0.5 10*3/uL (ref 0.1–1.0)
Monocytes Relative: 6 %
Neutro Abs: 5.6 10*3/uL (ref 1.7–7.7)
Neutrophils Relative %: 69 %
Platelets: 241 10*3/uL (ref 150–400)
RBC: 3.83 MIL/uL — ABNORMAL LOW (ref 3.87–5.11)
RDW: 14.7 % (ref 11.5–15.5)
WBC: 8.1 10*3/uL (ref 4.0–10.5)
nRBC: 0 % (ref 0.0–0.2)

## 2020-12-16 LAB — COMPREHENSIVE METABOLIC PANEL
ALT: 17 U/L (ref 0–44)
AST: 14 U/L — ABNORMAL LOW (ref 15–41)
Albumin: 3.5 g/dL (ref 3.5–5.0)
Alkaline Phosphatase: 122 U/L (ref 38–126)
Anion gap: 10 (ref 5–15)
BUN: 17 mg/dL (ref 6–20)
CO2: 31 mmol/L (ref 22–32)
Calcium: 8.8 mg/dL — ABNORMAL LOW (ref 8.9–10.3)
Chloride: 94 mmol/L — ABNORMAL LOW (ref 98–111)
Creatinine, Ser: 0.89 mg/dL (ref 0.44–1.00)
GFR, Estimated: >60 mL/min (ref >60)
Glucose, Bld: 223 mg/dL — ABNORMAL HIGH (ref 70–99)
Potassium: 3.5 mmol/L (ref 3.5–5.1)
Sodium: 135 mmol/L (ref 135–145)
Total Bilirubin: 0.5 mg/dL (ref 0.3–1.2)
Total Protein: 6.9 g/dL (ref 6.5–8.1)

## 2020-12-16 LAB — LACTIC ACID, PLASMA: Lactic Acid, Venous: 1.4 mmol/L (ref 0.5–1.9)

## 2020-12-16 NOTE — ED Triage Notes (Signed)
Pt presents to ER via pov c/o dizziness and light headed.  Pt states she had lingual tonsillectomy on Tuesday this week and was released from hospital today around 1000.  Pt having pain and tenderness around incision site at this time.  Surgical site does not appear red.  Pt states she does feel sob at this time.  Pt speaking in full sentences.  No fever at this time but pt states she has had some chills.

## 2020-12-17 ENCOUNTER — Emergency Department
Admission: EM | Admit: 2020-12-17 | Discharge: 2020-12-17 | Disposition: A | Discharge status: Home / Self Care | Payer: Medicare Other | Attending: Emergency Medicine | Admitting: Emergency Medicine

## 2020-12-17 ENCOUNTER — Encounter: Payer: Self-pay | Admitting: Radiology

## 2020-12-17 ENCOUNTER — Emergency Department: Payer: Medicare Other

## 2020-12-17 DIAGNOSIS — M542 Cervicalgia: Secondary | ICD-10-CM

## 2020-12-17 DIAGNOSIS — R739 Hyperglycemia, unspecified: Secondary | ICD-10-CM

## 2020-12-17 LAB — TROPONIN I (HIGH SENSITIVITY): Troponin I (High Sensitivity): <2 ng/L (ref <18)

## 2020-12-17 LAB — HCG, QUANTITATIVE, PREGNANCY: hCG, Beta Chain, Quant, S: 1 m[IU]/mL (ref <5)

## 2020-12-17 MED ORDER — FENTANYL CITRATE PF 50 MCG/ML IJ SOSY
50.0000 ug | PREFILLED_SYRINGE | Freq: Once | INTRAMUSCULAR | Status: AC
  Administered 2020-12-17: 50 ug via INTRAVENOUS
  Filled 2020-12-17: qty 1

## 2020-12-17 MED ORDER — CLINDAMYCIN HCL 300 MG PO CAPS: 300.0000 mg | ORAL_CAPSULE | Freq: Three times a day (TID) | ORAL | 0 refills | Status: AC

## 2020-12-17 MED ORDER — IOHEXOL 350 MG/ML SOLN
75.0000 mL | Freq: Once | INTRAVENOUS | Status: AC | PRN
  Administered 2020-12-17: 75 mL via INTRAVENOUS
  Filled 2020-12-17: qty 75

## 2020-12-17 MED ORDER — LACTATED RINGERS IV BOLUS
1000.0000 mL | Freq: Once | INTRAVENOUS | Status: AC
  Administered 2020-12-17: 1000 mL via INTRAVENOUS

## 2020-12-17 MED ORDER — ONDANSETRON 4 MG PO TBDP
4.0000 mg | ORAL_TABLET | Freq: Once | ORAL | Status: AC | PRN
  Administered 2020-12-17: 4 mg via ORAL
  Filled 2020-12-17: qty 1

## 2020-12-17 NOTE — ED Notes (Signed)
Pt given zofran per MAR. Advised if she was nauseous that she should remain NPO at this time. Pt argumentative, insists that she will continue to chew ice & drink sprite. Pt advised of risks. No evidence of learning noted.

## 2020-12-17 NOTE — ED Notes (Signed)
See triage note  presents with pain and subjective fever   states had surgery on Tuesday  incision line intact  no drainage noted  afebrile on arrival   area to throat/neck swollen and red  tender to touch

## 2020-12-17 NOTE — ED Notes (Signed)
Pt up to desk again regarding wait time. Pt upset that other pts are being called back before her. RN attempted to explain ED process, pt became irritated and walked away without willingness to discuss situation with this RN. Pt requesting to speak to charge nurse for a second time since arriving.

## 2020-12-17 NOTE — ED Provider Notes (Signed)
[facility] Emergency Department Provider Note  ____________________________________________   Event Date/Time   First MD Initiated Contact with Patient 12/17/20 [phone]     (approximate)  I have reviewed the triage vital signs and the nursing notes.   HISTORY  Chief Complaint Post-op Problem and Dizziness   HPI Marie Clarke is a 50 y.o. female with past medical history of COPD, CHF on 80 mg of Lasix daily, arthritis, asthma, anxiety, OSA, DDD, HTN, HDL, hypothyroidism, osteoporosis, CKD, TIA and recent lingual tonsillectomy, epiglottopexy and hyoid suspension discharged from [facility] yesterday who presents with several concerns primarily worsening pain redness and swelling on the front of her neck associate with some shortness of breath.  Patient states she has not been able to eat or drink much and thinks he may be getting a little dehydrated as well.  She denies any fevers, chest pain, back pain, abdominal pain, vomiting, diarrhea, burning with urination, earache, headache or other acute sick symptoms.  She was told to come to emergency room by on-call nurse to be checked out.  She notes she has been taking her liquid Dilaudid as recommended but still is having significant pain.  No other acute concerns at this time.          Past Medical History:  Diagnosis Date   Anemia    Anginal pain (HCC)    Anxiety    Arthritis    Asthma    Bilateral lower extremity edema    Bipolar disorder ([place])    CAD (coronary artery disease) unk   CHF (congestive heart failure) (HCC)    Chronic pain syndrome    COPD (chronic obstructive pulmonary disease) ([place])    emphysema   DDD (degenerative disc disease), lumbar    Depression unk   Diabetes mellitus without complication ([place])    Diabetes mellitus, type II ([place])    Drug overdose 2013-05-17   after dad died   Dysrhythmia    history of tachy arrythmias   Fibromyalgia    GERD (gastroesophageal reflux  disease)    Headache    chronic migraines   Hyperlipidemia    Hypertension    Hypothyroidism    Left leg pain May 18, 2014   MI (myocardial infarction) ([place]) May 17, 2005   Muscle ache 09/16/2014   Osteoporosis    Overactive bladder    Pancreatitis unk   Panic attacks    PTSD (post-traumatic stress disorder)    Reflex sympathetic dystrophy    Renal cyst, left    Renal insufficiency    ckd stage iii   Restless legs syndrome    Sleep apnea 06/2019   uses cpap   Sleep apnea    Stroke ([place]) 05-17-2008   TIA x 2. no residual deficits   Suicide attempt [facility])    after father died.   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   Seizure-like activity ([place]) 09/30/2020   Hyperlipidemia    COPD (chronic obstructive pulmonary disease) (HCC)    Chronic diastolic CHF (congestive heart failure) (HCC)    Pain in thoracic spine 05/02/2020   Rectal pain 04/07/2020   Partial small bowel obstruction ([place]) 03/29/2020   Adrenal nodule ([place])  03/19/2020   Hypokalemia    SBO (small bowel obstruction) ([place]) 12/02/2019   Chronic pain 12/02/2019   Presence of neurostimulator (SCS) (June 2021) 10/06/2019   Pain due to any device, implant or graft 09/21/2019   Anxiety 09/10/2019   Involuntary movements 09/10/2019   Abnormal involuntary movement 08/18/2019   Bipolar disorder, in full remission, most recent episode mixed ([place]) 08/11/2019   Lumbosacral radiculopathy at L5 (Right) 04/09/2019   Chronic migraine 03/12/2019   Gout 01/21/2019   Noncompliance with treatment regimen 12/29/2018   BMI 40.0-44.9, adult ([place]) 11/19/2018   AKI (acute kidney injury) ([place]) 11/17/2018   Postural dizziness with presyncope 09/29/2018   Bipolar I disorder, most recent episode  mixed ([place]) 09/10/2018   Panic attacks 09/10/2018   Chronic lumbosacral L5-S1 IVD protrusion (Bilateral) 08/25/2018   Lumbar lateral recess stenosis (L5-S1) (Bilateral) 08/25/2018   Wound disruption 08/05/2018   Osteoarthritis involving multiple joints 07/10/2018   Long term current use of non-steroidal anti-inflammatories (NSAID) 07/10/2018   NSAID induced gastritis 07/10/2018   DDD (degenerative disc disease), lumbosacral 04/22/2018   Disease related peripheral neuropathy 11/13/2017   Gastroesophageal reflux disease without esophagitis 11/13/2017   Occipital headache (Bilateral) 11/13/2017   Cervicogenic headache (Bilateral) (L>R) 11/13/2017   History of postoperative nausea 10/22/2017   Chronic shoulder pain (5th area of Pain) (Bilateral) (L>R) 10/09/2017   Ankle joint instability (Left) 10/09/2017   Ankle sprain, sequela (Left) 10/09/2017   History of psychiatric symptoms 10/09/2017   Chronic pain syndrome 10/02/2017   Spondylosis without myelopathy or radiculopathy, lumbosacral region 10/02/2017   Chronic musculoskeletal pain 10/02/2017   Elevated C-reactive protein (CRP) 09/10/2017   Elevated sed rate 09/10/2017   Chronic neck pain (4th area of Pain) (Bilateral) (L>R) 09/09/2017   Pharmacologic therapy 09/09/2017   Disorder of skeletal system 09/09/2017   Problems influencing health status 09/09/2017   Long term current use of opiate analgesic 09/09/2017   Tobacco use disorder 07/16/2017   Ventral hernia without obstruction or gangrene 06/25/2017   Strain of extensor muscle, fascia and tendon of left index finger at wrist and hand level, initial encounter 05/16/2017   Sepsis ([place]) 04/14/2017   Osteopenia 04/03/2017   Hypotension 09/17/2016   Contusion of knee (Left) 10/12/2015   Strain of knee (Left) 10/12/2015   Incidental lung nodule 04/28/2015   Chronic low back pain (1ry area of Pain) (Bilateral) (L>R) 03/28/2015   Chronic lower extremity pain (Referred) (2ry area of  Pain) (Left) 03/28/2015   Abdominal wound dehiscence 03/28/2015   Encounter for pain management planning 03/28/2015   Morbid obesity ([place]) 03/28/2015   Abnormal CT scan, lumbar spine 03/28/2015   Lumbar facet hypertrophy 03/28/2015   Lumbar facet syndrome (Bilateral) (L>R) 03/28/2015   Lumbar foraminal stenosis (Bilateral) (L5-S1) 03/28/2015   Chronic ankle pain (3ry area of Pain) (Left) 03/28/2015   Neurogenic pain 03/28/2015   Neuropathic pain 03/28/2015   Myofascial pain 03/28/2015   History of suicide attempt 03/28/2015   PTSD (post-traumatic stress disorder) 01/13/2015   Abnormal gait 12/15/2014   Congestive heart failure ([place]) 11/15/2014   Abdominal wall abscess 09/20/2014   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   Diabetes mellitus, type 2 ([place]) 08/13/2014   Bipolar affective disorder ([place]) 08/13/2014   Type 2 diabetes mellitus with hyperlipidemia ([place]) 08/13/2014   Rectal prolapse 08/09/2014   Rectal bleeding 08/09/2014   Rectal bleed 08/09/2014   Affective bipolar disorder ([place]) 08/05/2014   Arteriosclerosis of coronary artery 08/05/2014   CCF (congestive cardiac failure) ([place]) 08/05/2014  Chronic kidney disease 08/05/2014   Detrusor muscle hypertonia 08/05/2014   Apnea, sleep 08/05/2014   Temporary cerebral vascular dysfunction 08/05/2014   Polypharmacy 04/29/2014   Other long term (current) drug therapy 04/29/2014   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   Chronic kidney disease, stage III (moderate) ([place]) 12/14/2013   Controlled diabetes mellitus type II without complication ([place]) 28/31/5176   Essential (primary) hypertension 12/03/2013   Adult hypothyroidism 12/03/2013   Controlled type 2 diabetes mellitus without complication ([place]) 16/08/3708    Past Surgical History:  Procedure Laterality Date   ABDOMINAL HYSTERECTOMY     CARDIAC CATHETERIZATION     HERNIA REPAIR  07/15/2017   [facility]   lumbar facet     several   MELANOMA EXCISION  03/2020   OUTSIDE OF RECTUM    prolapse rectum surgery N/A 08/2014   SPINAL CORD STIMULATOR REMOVAL N/A 11/07/2020   Procedure: REMOVAL OF THORACIC SPINAL CORD STIMULATOR & PULSE GENERATOR;  Surgeon: [name], MD;  Location: [facility] ORS;  Service: Neurosurgery;  Laterality: N/A;   THORACIC LAMINECTOMY FOR SPINAL CORD STIMULATOR N/A 07/27/2019   Procedure: THORACIC SPINAL CORD STIMULATOR PLACEMENT WITH RIGHT FLANK PULSE GENERATOR;  Surgeon: [name], MD;  Location: [facility] ORS;  Service: Neurosurgery;  Laterality: N/A;   TONSILLECTOMY      Prior to Admission medications   Medication Sig Start Date End Date Taking? Authorizing Provider  clindamycin (CLEOCIN) 300 MG capsule Take 1 capsule (300 mg total) by mouth 3 (three) times daily for 7 days. 12/17/20 12/24/20 Yes Lucrezia Starch, MD  atorvastatin (LIPITOR) 40 MG tablet Take 40 mg by mouth at bedtime. 04/05/20   [provider]  CAPLYTA 42 MG capsule Take 42 mg by mouth at bedtime. 09/20/20   [provider]  cholecalciferol (VITAMIN D3) 25 MCG (1000 UNIT) tablet Take 1,000 Units by mouth daily.    [provider]  clonazepam (KLONOPIN) 0.125 MG disintegrating tablet Take 0.125 mg by mouth 3 (three) times daily. 08/25/20   [provider]  cyanocobalamin 1000 MCG tablet Take 1,000 mcg by mouth daily.    [provider]  DALIRESP 500 MCG TABS tablet Take 500 mcg by mouth daily. 10/20/20   [provider]  Dulaglutide (TRULICITY) 6.26 RS/8.5IO SOPN Inject 0.75 mg into the skin every Thursday. 08/03/20   [provider]  famotidine (PEPCID) 20 MG tablet Take 40 mg by mouth at bedtime.    [provider]  furosemide (LASIX) 80 MG tablet Take 80 mg by mouth 2 (two) times daily. 09/08/20   [provider]  gabapentin (NEURONTIN) 300 MG capsule Take 300-600 mg by mouth See admin instructions. Take 300 mg in the morning and noon and 600 mg at bedtime 08/25/20   [provider]  hydrOXYzine  (ATARAX/VISTARIL) 25 MG tablet Take 1 tablet (25 mg total) by mouth 2 (two) times daily as needed for anxiety. 10/04/20   Ghimire, Henreitta Leber, MD  hydrOXYzine (ATARAX/VISTARIL) 50 MG tablet Take 1 tablet (50 mg total) by mouth at bedtime. 10/04/20   Ghimire, Henreitta Leber, MD  levocetirizine (XYZAL) 5 MG tablet Take 5 mg by mouth daily. 10/18/20   [provider]  methocarbamol (ROBAXIN) 750 MG tablet Take 750 mg by mouth 3 (three) times daily. 10/18/20   [provider]  metoprolol succinate (TOPROL-XL) 50 MG 24 hr tablet Take 50 mg by mouth 2 (two) times daily. 04/29/20 04/29/21  [provider]  montelukast (SINGULAIR) 10 MG tablet Take 10 mg by  mouth at bedtime.    [provider]  Multiple Vitamin (MULTIVITAMIN) tablet Take 1 tablet by mouth daily.    [provider]  nitroGLYCERIN (NITROLINGUAL) 0.4 MG/SPRAY spray Place 1 spray under the tongue as needed for chest pain. 11/02/20 11/02/21  [provider]  nitroGLYCERIN (NITROSTAT) 0.4 MG SL tablet Place 0.4 mg under the tongue every 5 (five) minutes as needed for chest pain. 04/21/20   [provider]  omeprazole (PRILOSEC) 40 MG capsule Take 40 mg by mouth 2 (two) times daily.    [provider]  orphenadrine (NORFLEX) 100 MG tablet Take 1 tablet (100 mg total) by mouth 2 (two) times daily. 11/04/20 11/04/21  Lavonia Drafts, MD  potassium chloride SA (KLOR-CON) 20 MEQ tablet Take 40 mEq by mouth 2 (two) times daily. 12/25/19   [provider]  PROAIR HFA 108 (90 Base) MCG/ACT inhaler Inhale 2 puffs into the lungs every 6 (six) hours as needed for wheezing or shortness of breath.    [provider]  promethazine (PHENERGAN) 25 MG tablet Take 25 mg by mouth every 6 (six) hours as needed for nausea/vomiting. 10/19/20   [provider]  rizatriptan (MAXALT) 10 MG tablet Take 10 mg by mouth as needed for migraine. 10/19/20   [provider]  rOPINIRole (REQUIP) 1  MG tablet Take 1 mg by mouth 3 (three) times daily. 12/10/19   [provider]  SYMBICORT 160-4.5 MCG/ACT inhaler Inhale 2 puffs into the lungs 2 (two) times daily.    [provider]  traZODone (DESYREL) 50 MG tablet Take 50-150 mg by mouth at bedtime as needed for sleep. 10/24/20   [provider]  vitamin C (ASCORBIC ACID) 500 MG tablet Take 500 mg by mouth daily.    [provider]  vitamin E 180 MG (400 UNITS) capsule Take 400 Units by mouth daily.    [provider]    Allergies Diazepam, Ziprasidone hcl, Azithromycin, Divalproex sodium, Levofloxacin, Lisinopril, Metronidazole, Sulfa antibiotics, Valproic acid, Cephalexin, Ciprofloxacin, Doxycycline, and Penicillins  Family History  Problem Relation Age of Onset   Diabetes Mellitus II Mother    CAD Mother    Sleep apnea Mother    Osteoarthritis Mother    Osteoporosis Mother    Anxiety disorder Mother    Depression Mother    Bipolar disorder Mother    Bipolar disorder Father    Hypertension Father    Depression Father    Anxiety disorder Father    Post-traumatic stress disorder Sister     Social History Social History   Tobacco Use   Smoking status: Former    Packs/day: 0.50    Types: Cigarettes    Quit date: 09/01/2018    Years since quitting: 2.2   Smokeless tobacco: Never   Tobacco comments:    Patient quit smoking 09/01/2018  Vaping Use   Vaping Use: Never used  Substance Use Topics   Alcohol use: No    Alcohol/week: 0.0 standard drinks   Drug use: No    Review of Systems  Review of Systems  Constitutional:  Negative for chills and fever.  HENT:  Positive for sore throat.   Eyes:  Negative for pain.  Respiratory:  Positive for shortness of breath. Negative for cough and stridor.   Cardiovascular:  Negative for chest pain.  Gastrointestinal:  Negative for vomiting.  Genitourinary:  Negative for dysuria.  Musculoskeletal:  Positive for neck pain.  Skin:   Negative for rash.  Neurological:  Negative for seizures, loss of consciousness and headaches.  Psychiatric/Behavioral:  Negative for suicidal ideas.   All other systems reviewed and are negative.   Patient has a relatively clean appearing linear incision site over highway.  However there is some erythema tenderness and edema over the anterior neck. ____________________________________________   PHYSICAL EXAM:  VITAL SIGNS: ED Triage Vitals  Enc Vitals Group     BP 12/16/20 2256 112/67     Pulse Rate 12/16/20 2256 (!) 118     Resp 12/16/20 2256 18     Temp 12/16/20 2256 98.4 F (36.9 C)     Temp Source 12/16/20 2256 Oral     SpO2 12/16/20 2256 93 %     Weight 12/16/20 2257 225 lb (102.1 kg)     Height 12/16/20 2257 5\' 4"  (1.626 m)     Head Circumference --      Peak Flow --      Pain Score 12/16/20 2256 9     Pain Loc --      Pain Edu? --      Excl. in [place]? --    Vitals:   12/17/20 0901 12/17/20 1020  BP: 109/65 108/66  Pulse: 97 95  Resp: 20 16  Temp:    SpO2: 94% 93%   Physical Exam Vitals and nursing note reviewed.  Constitutional:      General: She is not in acute distress.    Appearance: She is well-developed.  HENT:     Head: Normocephalic and atraumatic.     Right Ear: External ear normal.     Left Ear: External ear normal.     Nose: Nose normal.  Eyes:     Conjunctiva/sclera: Conjunctivae normal.  Cardiovascular:     Rate and Rhythm: Normal rate and regular rhythm.     Heart sounds: No murmur heard. Pulmonary:     Effort: Pulmonary effort is normal. No respiratory distress.     Breath sounds: Normal breath sounds.  Abdominal:     Palpations: Abdomen is soft.     Tenderness: There is no abdominal tenderness.  Musculoskeletal:     Cervical back: Neck supple.     Right lower leg: No edema.     Left lower leg: No edema.  Skin:    General: Skin is warm and dry.  Neurological:     Mental Status: She is alert and oriented to person, place, and time.   Psychiatric:        Mood and Affect: Mood normal.    Anterior neck incision site appears clean dry and intact although there is some swelling and tenderness throughout the anterior neck.  There is no overlying induration, erythema or active drainage or bleeding.  No stridor.  Oropharynx shows no bleeding or exudates.  Cranial nerves II through XII are grossly intact. ____________________________________________   LABS (all labs ordered are listed, but only abnormal results are displayed)  Labs Reviewed  COMPREHENSIVE METABOLIC PANEL - Abnormal; Notable for the following components:      Result Value   Chloride 94 (*)    Glucose, Bld 223 (*)    Calcium 8.8 (*)    AST 14 (*)    All other components within normal limits  CBC WITH DIFFERENTIAL/PLATELET - Abnormal; Notable for the following components:   RBC 3.83 (*)    Hemoglobin 11.2 (*)    HCT 34.3 (*)    All other components within normal limits  LACTIC ACID, PLASMA  HCG, QUANTITATIVE, PREGNANCY  LACTIC ACID, PLASMA  URINALYSIS, ROUTINE W REFLEX MICROSCOPIC  TROPONIN I (HIGH SENSITIVITY)   ____________________________________________  EKG  ECG remarkable for sinus tachycardia with ventricular rate of 107, normal axis, unremarkable intervals with nonspecific change in lead III without other clearance of acute ischemia or significant arrhythmia. ____________________________________________  RADIOLOGY  ED MD interpretation:  Chest x-ray shows no focal consolidations, effusion, edema, pneumothorax or other clear acute thoracic process.  CT soft tissue neck shows a fluid collection and some gas possibly postsurgical in the midline submental space along the right mylohyoid muscle.  There are also some postop changes along the strap muscles and anterior thyroid.  Patient is noted to be status post lingual tonsillectomy some mild thickening of the epiglottis but no airway compromise.  CTA chest shows no evidence of PE, pneumonia,  pneumothorax, effusion, edema or other acute thoracic process.  Official radiology report(s): DG Chest 2 View  Result Date: 12/16/2020 CLINICAL DATA:  Dizziness and lightheadedness EXAM: CHEST - 2 VIEW COMPARISON:  AP and lateral chest 11/02/2020 FINDINGS: The heart size and mediastinal contours are within normal limits. There is a low inspiration with perihilar and left lower zonal atelectatic bands. No focal pneumonia is seen. There is no pleural effusion. The visualized skeletal structures are unremarkable. IMPRESSION: Low inspiration, with limited view of the bases. No evidence of acute chest disease. Electronically Signed   By: Telford Nab M.D.   On: 12/16/2020 23:54   CT Soft Tissue Neck W Contrast  Result Date: 12/17/2020 CLINICAL DATA:  50 year old female with dizziness. Status post lingual tonsillectomy this week, discharged yesterday. Pain and tenderness. Cellulitis. EXAM: CT NECK WITH CONTRAST TECHNIQUE: Multidetector CT imaging of the neck was performed using the standard protocol following the bolus administration of intravenous contrast. CONTRAST:  42mL OMNIPAQUE IOHEXOL 350 MG/ML SOLN COMPARISON:  CTA head and neck 09/30/2020. Chest CTA today reported separately. FINDINGS: Pharynx and larynx: Postoperative defect at the lingual tonsil with regional mucosal irregularity. Mild thickening of the epiglottis (sagittal image 46), and mild retained secretions in the region. Elsewhere the larynx is within normal limits. No significant airway narrowing. Parapharyngeal and retropharyngeal spaces remain normal. Salivary glands: Sublingual space remains within normal limits, but deep to the floor of the mouth there is inflammation along the right mylohyoid muscle, and extending superficially to the platysma with an indistinct area of fluid and small volume gas encompassing 29 x 18 x 20 mm (AP by transverse by CC). There is no rim enhancement. There is a small surgical clip in the region, and nearby a  bone anchor along the bottom of the mandible symphysis. The left mylohyoid muscle is relatively spared. The submandibular spaces and submandibular glands are relatively spared. Parotid glands remain normal. Thyroid: Surgical clips along the right strap muscle with trace regional soft tissue gas including along both anterior thyroid lobes (series 1, image 64 on the right). Underlying mild thyroid heterogeneity appears stable. Lymph nodes: Mild reactive appearing bilateral level 2 lymph nodes. Level 1 nodes remain diminutive. The largest nodes are 8-9 mm short axis at the level 2A stations. No cystic or necrotic nodes. Left level 3 nodes also appear larger since August, reactive. Vascular: Major vascular structures in the neck and at the skull base remain patent. Partially retropharyngeal course of the right ICA. Limited intracranial: Negative. Visualized orbits: Negative. Mastoids and visualized paranasal sinuses: Clear. Skeleton: Bone anchor along the bottom of the mandible symphysis, with chronically absent dentition. No mandible fracture or bone erosion. No significant cervical  spine degeneration. No other No acute osseous abnormality identified. Upper chest: See CTA chest reported separately. IMPRESSION: 1. Indistinct collection of fluid and gas along with regional soft tissue inflammation in the midline submental space and along the right mylohyoid muscle, 2.9 x 1.8 x 2.0 cm. Given recent surgery, this might be developing Abscess Or Postoperative Seroma. No rim enhancement at this time. Surgical clip in this region, and nearby bone anchor along the bottom of the mandible symphysis. The sublingual and submandibular spaces are relatively spared, although there are postoperative changes also along the strap muscles and anterior to the thyroid. 2. Superimposed lingual tonsillectomy changes with mild thickening of the epiglottis, but no airway compromise or other adverse features. 3. Chest CTA today reported  separately. Electronically Signed   By: Genevie Ann M.D.   On: 12/17/2020 11:32   CT Angio Chest PE W and/or Wo Contrast  Result Date: 12/17/2020 CLINICAL DATA:  50 year old female with dizziness. Status post lingual tonsillectomy this week, discharged yesterday. Pain and tenderness. Cellulitis. EXAM: CT ANGIOGRAPHY CHEST WITH CONTRAST TECHNIQUE: Multidetector CT imaging of the chest was performed using the standard protocol during bolus administration of intravenous contrast. Multiplanar CT image reconstructions and MIPs were obtained to evaluate the vascular anatomy. CONTRAST:  43mL OMNIPAQUE IOHEXOL 350 MG/ML SOLN COMPARISON:  Neck CT today.  CTA chest 04/23/2015. CT Abdomen and Pelvis 04/26/2016. FINDINGS: Cardiovascular: Suboptimal but adequate contrast bolus timing in the pulmonary arterial tree. No focal filling defect identified in the pulmonary arteries to suggest acute pulmonary embolism. Cardiac size at the upper limits of normal. No pericardial effusion. Negative visible aorta. Mediastinum/Nodes: Negative. No mediastinal inflammation or lymphadenopathy. Lungs/Pleura: Lower lung volumes. Major airways remain patent. Confluent crowding of bilateral perihilar markings with superimposed bilateral lung mosaic attenuation which is probably superimposed gas trapping. There is platelike opacity in the left lingula and lower lobe which is enhancing, favoring atelectasis. No pleural effusion. Upper Abdomen: Possible Hepatic steatosis, but otherwise negative visible liver, gallbladder, spleen, pancreas, right adrenal gland, kidneys, and bowel in the upper abdomen. Stable benign left adrenal gland adenoma since 2018. Musculoskeletal: No acute osseous abnormality identified. Review of the MIP images confirms the above findings. IMPRESSION: 1. Negative for acute pulmonary embolus. 2. Lower lung volumes with widespread pulmonary atelectasis and gas trapping. No pleural effusion. 3. Chronic hepatic steatosis, benign  left adrenal gland adenoma. Electronically Signed   By: Genevie Ann M.D.   On: 12/17/2020 11:36    ____________________________________________   PROCEDURES  Procedure(s) performed (including Critical Care):  Procedures   ____________________________________________   INITIAL IMPRESSION / ASSESSMENT AND PLAN / ED COURSE      Patient presents with above-stated history and exam for assessment of some anterior neck pain redness and swelling as well as some shortness of breath and sore throat after being discharged yesterday following above-noted ENT procedure.  On arrival patient is tachycardic at 118 with otherwise stable vital signs on room air.  She does appear mildly dehydrated with dry mucous membranes and prolonged capillary refill and has significant erythema tenderness and swelling on anterior neck.  Differential includes acute infection, hematoma, viral pharyngitis or bronchitis, PE and possible postoperative seroma or hematoma.  Chest x-ray and CTA head no evidence of pneumonia, acute heart failure exacerbation with pulmonary edema, PE, pneumothorax or other clear acute thoracic process.  CT neck shows likely seroma versus hematoma with a lower suspicion for an abscess.  Airway appears widely open and patent.  CBC shows no leukocytosis or significant  acute anemia.  CMP shows mild hyperglycemia with a glucose of 223 without evidence of acidosis and no other significant electrolyte metabolic derangements. ECG and undetectable troponin are not suggestive of atypical presentation for ACS or arrhythmia.  Pregnancy test is negative.  Patient given some IV fluids analgesia on arrival she appears slightly dehydrated.  I discussed above presentation and work-up with on-call ENT physician Dr. Posey Pronto at [facility] who reviewed patient's CT and noted that given absence of fever or leukocytosis get a lower suspicion for abscess or acute infection but the patient was at risk for  possible developing infection given likely postoperative seroma or small hematoma.  He recommended starting patient on clindamycin.  My assessment her heart rate is 95 and she states she is feeling much better.  She is able to maintain SPO2 greater than 93% which I think is reasonable given her history of obstructive sleep apnea.  Do not believe she requires emergent hospitalization or in person surgical consult at this time I think she is stable after discussion with ENT for close outpatient follow-up.  I discussed with patient at length and she is amenable this plan.  She will return immediately for any acute worsening of symptoms.  Discharged stable condition.  Strict return precautions advised and discussed.  Advised patient of elevated sugars today and to have these rechecked by her PCP in next couple days.      ____________________________________________   FINAL CLINICAL IMPRESSION(S) / ED DIAGNOSES  Final diagnoses:  Neck pain  Hyperglycemia    Medications  ondansetron (ZOFRAN-ODT) disintegrating tablet 4 mg (4 mg Oral Given 12/17/20 0040)  lactated ringers bolus 1,000 mL (1,000 mLs Intravenous New Bag/Given 12/17/20 1021)  fentaNYL (SUBLIMAZE) injection 50 mcg (50 mcg Intravenous Given 12/17/20 1021)  iohexol (OMNIPAQUE) 350 MG/ML injection 75 mL (75 mLs Intravenous Contrast Given 12/17/20 1045)     ED Discharge Orders          Ordered    clindamycin (CLEOCIN) 300 MG capsule  3 times daily        12/17/20 1328             Note:  This document was prepared using Dragon voice recognition software and may include unintentional dictation errors.    Lucrezia Starch, MD 12/17/20 [phone]

## 2020-12-22 ENCOUNTER — Other Ambulatory Visit: Payer: Self-pay

## 2020-12-22 ENCOUNTER — Emergency Department: Payer: Medicare Other

## 2020-12-22 ENCOUNTER — Emergency Department
Admission: EM | Admit: 2020-12-22 | Discharge: 2020-12-22 | Disposition: A | Discharge status: Home / Self Care | Payer: Medicare Other | Attending: Emergency Medicine | Admitting: Emergency Medicine

## 2020-12-22 DIAGNOSIS — E119 Type 2 diabetes mellitus without complications: Secondary | ICD-10-CM | POA: Diagnosis not present

## 2020-12-22 DIAGNOSIS — Z79899 Other long term (current) drug therapy: Secondary | ICD-10-CM | POA: Insufficient documentation

## 2020-12-22 DIAGNOSIS — J45909 Unspecified asthma, uncomplicated: Secondary | ICD-10-CM | POA: Insufficient documentation

## 2020-12-22 DIAGNOSIS — Z87891 Personal history of nicotine dependence: Secondary | ICD-10-CM | POA: Insufficient documentation

## 2020-12-22 DIAGNOSIS — I130 Hypertensive heart and chronic kidney disease with heart failure and stage 1 through stage 4 chronic kidney disease, or unspecified chronic kidney disease: Secondary | ICD-10-CM | POA: Insufficient documentation

## 2020-12-22 DIAGNOSIS — Z794 Long term (current) use of insulin: Secondary | ICD-10-CM | POA: Insufficient documentation

## 2020-12-22 DIAGNOSIS — N183 Chronic kidney disease, stage 3 unspecified: Secondary | ICD-10-CM | POA: Diagnosis not present

## 2020-12-22 DIAGNOSIS — I5032 Chronic diastolic (congestive) heart failure: Secondary | ICD-10-CM | POA: Diagnosis not present

## 2020-12-22 DIAGNOSIS — J449 Chronic obstructive pulmonary disease, unspecified: Secondary | ICD-10-CM | POA: Diagnosis not present

## 2020-12-22 DIAGNOSIS — Z7951 Long term (current) use of inhaled steroids: Secondary | ICD-10-CM | POA: Diagnosis not present

## 2020-12-22 DIAGNOSIS — M542 Cervicalgia: Secondary | ICD-10-CM

## 2020-12-22 DIAGNOSIS — I2510 Atherosclerotic heart disease of native coronary artery without angina pectoris: Secondary | ICD-10-CM | POA: Diagnosis not present

## 2020-12-22 DIAGNOSIS — E039 Hypothyroidism, unspecified: Secondary | ICD-10-CM | POA: Diagnosis not present

## 2020-12-22 DIAGNOSIS — G8918 Other acute postprocedural pain: Secondary | ICD-10-CM | POA: Insufficient documentation

## 2020-12-22 DIAGNOSIS — L7634 Postprocedural seroma of skin and subcutaneous tissue following other procedure: Secondary | ICD-10-CM

## 2020-12-22 LAB — CBC WITH DIFFERENTIAL/PLATELET
Abs Immature Granulocytes: 0.07 10*3/uL (ref 0.00–0.07)
Basophils Absolute: 0 10*3/uL (ref 0.0–0.1)
Basophils Relative: 0 %
Eosinophils Absolute: 0.1 10*3/uL (ref 0.0–0.5)
Eosinophils Relative: 1 %
HCT: 34 % — ABNORMAL LOW (ref 36.0–46.0)
Hemoglobin: 11.1 g/dL — ABNORMAL LOW (ref 12.0–15.0)
Immature Granulocytes: 1 %
Lymphocytes Relative: 20 %
Lymphs Abs: 1.8 10*3/uL (ref 0.7–4.0)
MCH: 29.1 pg (ref 26.0–34.0)
MCHC: 32.6 g/dL (ref 30.0–36.0)
MCV: 89 fL (ref 80.0–100.0)
Monocytes Absolute: 0.6 10*3/uL (ref 0.1–1.0)
Monocytes Relative: 6 %
Neutro Abs: 6.8 10*3/uL (ref 1.7–7.7)
Neutrophils Relative %: 72 %
Platelets: 307 10*3/uL (ref 150–400)
RBC: 3.82 MIL/uL — ABNORMAL LOW (ref 3.87–5.11)
RDW: 14.6 % (ref 11.5–15.5)
WBC: 9.4 10*3/uL (ref 4.0–10.5)
nRBC: 0 % (ref 0.0–0.2)

## 2020-12-22 LAB — COMPREHENSIVE METABOLIC PANEL
ALT: 16 U/L (ref 0–44)
AST: 15 U/L (ref 15–41)
Albumin: 3.4 g/dL — ABNORMAL LOW (ref 3.5–5.0)
Alkaline Phosphatase: 130 U/L — ABNORMAL HIGH (ref 38–126)
Anion gap: 9 (ref 5–15)
BUN: 8 mg/dL (ref 6–20)
CO2: 29 mmol/L (ref 22–32)
Calcium: 8.6 mg/dL — ABNORMAL LOW (ref 8.9–10.3)
Chloride: 100 mmol/L (ref 98–111)
Creatinine, Ser: 0.71 mg/dL (ref 0.44–1.00)
GFR, Estimated: >60 mL/min (ref >60)
Glucose, Bld: 136 mg/dL — ABNORMAL HIGH (ref 70–99)
Potassium: 3.5 mmol/L (ref 3.5–5.1)
Sodium: 138 mmol/L (ref 135–145)
Total Bilirubin: 0.8 mg/dL (ref 0.3–1.2)
Total Protein: 7.7 g/dL (ref 6.5–8.1)

## 2020-12-22 MED ORDER — SODIUM CHLORIDE 0.9 % IV SOLN
12.5000 mg | Freq: Once | INTRAVENOUS | Status: AC
  Administered 2020-12-22: 12.5 mg via INTRAVENOUS
  Filled 2020-12-22: qty 12.5

## 2020-12-22 MED ORDER — MORPHINE SULFATE (PF) 4 MG/ML IV SOLN
4.0000 mg | Freq: Once | INTRAVENOUS | Status: AC
  Administered 2020-12-22: 4 mg via INTRAVENOUS
  Filled 2020-12-22: qty 1

## 2020-12-22 MED ORDER — ONDANSETRON HCL 4 MG/2ML IJ SOLN
4.0000 mg | Freq: Once | INTRAMUSCULAR | Status: AC
  Administered 2020-12-22: 4 mg via INTRAVENOUS
  Filled 2020-12-22: qty 2

## 2020-12-22 MED ORDER — SODIUM CHLORIDE 0.9 % IV BOLUS
1000.0000 mL | Freq: Once | INTRAVENOUS | Status: AC
  Administered 2020-12-22: 1000 mL via INTRAVENOUS

## 2020-12-22 MED ORDER — IOHEXOL 300 MG/ML  SOLN
75.0000 mL | Freq: Once | INTRAMUSCULAR | Status: AC | PRN
  Administered 2020-12-22: 75 mL via INTRAVENOUS

## 2020-12-22 NOTE — ED Provider Notes (Signed)
CT scan that shows larger seroma than previous CT scan.  At this time I doubt infection given lack of fever or leukocytosis.  Did discuss with Dr. Patrice Paradise with [facility].  He did suggest patient call the clinic tomorrow morning to try to arrange follow-up.   Nance Pear, MD 12/22/20 [phone]

## 2020-12-22 NOTE — ED Notes (Signed)
CT to powershare images to [facility],  ENT consulted at [facility] for post op instructions and follow care.

## 2020-12-22 NOTE — ED Provider Notes (Signed)
[facility] Emergency Department Provider Note ____________________________________________   Event Date/Time   First MD Initiated Contact with Patient 12/22/20 1625     (approximate)  I have reviewed the triage vital signs and the nursing notes.   HISTORY  Chief Complaint Post-op Problem  HPI Marie Clarke is a 50 y.o. female with history of diabetes, hypertension, MI, CHF presents to the emergency department for treatment and evaluation of vomiting and pain after having lingual tonsils removed and hyoid myotomy on 12/13/2020.  Procedure was performed at [facility].  Patient states that she has been unable to tolerate food or fluids and pain and nausea medicine that was prescribed is not helping.      Past Medical History:  Diagnosis Date   Anemia    Anginal pain (HCC)    Anxiety    Arthritis    Asthma    Bilateral lower extremity edema    Bipolar disorder ([place])    CAD (coronary artery disease) unk   CHF (congestive heart failure) (HCC)    Chronic pain syndrome    COPD (chronic obstructive pulmonary disease) ([place])    emphysema   DDD (degenerative disc disease), lumbar    Depression unk   Diabetes mellitus without complication ([place])    Diabetes mellitus, type II ([place])    Drug overdose 05-05-13   after dad died   Dysrhythmia    history of tachy arrythmias   Fibromyalgia    GERD (gastroesophageal reflux disease)    Headache    chronic migraines   Hyperlipidemia    Hypertension    Hypothyroidism    Left leg pain 2014-05-06   MI (myocardial infarction) ([place]) 05/05/05   Muscle ache 09/16/2014   Osteoporosis    Overactive bladder    Pancreatitis unk   Panic attacks    PTSD (post-traumatic stress disorder)    Reflex sympathetic dystrophy    Renal cyst, left    Renal insufficiency    ckd stage iii   Restless legs syndrome    Sleep apnea 06/2019   uses cpap   Sleep apnea    Stroke ([place]) 2008/05/05   TIA x 2. no residual deficits   Suicide attempt [facility])     after father died.   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   Seizure-like activity ([place]) 09/30/2020   Hyperlipidemia    COPD (chronic obstructive pulmonary disease) (HCC)    Chronic diastolic CHF (congestive heart failure) (HCC)    Pain in thoracic spine 05/02/2020   Rectal pain 04/07/2020   Partial small bowel obstruction ([place]) 03/29/2020   Adrenal nodule ([place]) 03/19/2020   Hypokalemia    SBO (small bowel obstruction) ([place]) 12/02/2019   Chronic pain 12/02/2019   Presence of neurostimulator (SCS) (June 2021) 10/06/2019   Pain due to any device, implant or graft 09/21/2019   Anxiety 09/10/2019   Involuntary movements 09/10/2019   Abnormal involuntary movement 08/18/2019   Bipolar disorder, in full remission, most recent episode mixed ([place]) 08/11/2019   Lumbosacral radiculopathy at L5 (Right) 04/09/2019   Chronic migraine 03/12/2019   Gout 01/21/2019   Noncompliance with treatment regimen 12/29/2018   BMI 40.0-44.9, adult ([place]) 11/19/2018  AKI (acute kidney injury) ([place]) 11/17/2018   Postural dizziness with presyncope 09/29/2018   Bipolar I disorder, most recent episode mixed ([place]) 09/10/2018   Panic attacks 09/10/2018   Chronic lumbosacral L5-S1 IVD protrusion (Bilateral) 08/25/2018   Lumbar lateral recess stenosis (L5-S1) (Bilateral) 08/25/2018   Wound disruption 08/05/2018   Osteoarthritis involving multiple joints 07/10/2018   Long term current use of non-steroidal anti-inflammatories (NSAID) 07/10/2018   NSAID induced gastritis 07/10/2018   DDD (degenerative disc disease), lumbosacral 04/22/2018   Disease related peripheral neuropathy 11/13/2017   Gastroesophageal reflux disease without esophagitis 11/13/2017    Occipital headache (Bilateral) 11/13/2017   Cervicogenic headache (Bilateral) (L>R) 11/13/2017   History of postoperative nausea 10/22/2017   Chronic shoulder pain (5th area of Pain) (Bilateral) (L>R) 10/09/2017   Ankle joint instability (Left) 10/09/2017   Ankle sprain, sequela (Left) 10/09/2017   History of psychiatric symptoms 10/09/2017   Chronic pain syndrome 10/02/2017   Spondylosis without myelopathy or radiculopathy, lumbosacral region 10/02/2017   Chronic musculoskeletal pain 10/02/2017   Elevated C-reactive protein (CRP) 09/10/2017   Elevated sed rate 09/10/2017   Chronic neck pain (4th area of Pain) (Bilateral) (L>R) 09/09/2017   Pharmacologic therapy 09/09/2017   Disorder of skeletal system 09/09/2017   Problems influencing health status 09/09/2017   Long term current use of opiate analgesic 09/09/2017   Tobacco use disorder 07/16/2017   Ventral hernia without obstruction or gangrene 06/25/2017   Strain of extensor muscle, fascia and tendon of left index finger at wrist and hand level, initial encounter 05/16/2017   Sepsis ([place]) 04/14/2017   Osteopenia 04/03/2017   Hypotension 09/17/2016   Contusion of knee (Left) 10/12/2015   Strain of knee (Left) 10/12/2015   Incidental lung nodule 04/28/2015   Chronic low back pain (1ry area of Pain) (Bilateral) (L>R) 03/28/2015   Chronic lower extremity pain (Referred) (2ry area of Pain) (Left) 03/28/2015   Abdominal wound dehiscence 03/28/2015   Encounter for pain management planning 03/28/2015   Morbid obesity ([place]) 03/28/2015   Abnormal CT scan, lumbar spine 03/28/2015   Lumbar facet hypertrophy 03/28/2015   Lumbar facet syndrome (Bilateral) (L>R) 03/28/2015   Lumbar foraminal stenosis (Bilateral) (L5-S1) 03/28/2015   Chronic ankle pain (3ry area of Pain) (Left) 03/28/2015   Neurogenic pain 03/28/2015   Neuropathic pain 03/28/2015   Myofascial pain 03/28/2015   History of suicide attempt 03/28/2015   PTSD (post-traumatic  stress disorder) 01/13/2015   Abnormal gait 12/15/2014   Congestive heart failure ([place]) 11/15/2014   Abdominal wall abscess 09/20/2014   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   Diabetes mellitus, type 2 ([place]) 08/13/2014   Bipolar affective disorder ([place]) 08/13/2014   Type 2 diabetes mellitus with hyperlipidemia ([place]) 08/13/2014   Rectal prolapse 08/09/2014   Rectal bleeding 08/09/2014   Rectal bleed 08/09/2014   Affective bipolar disorder ([place]) 08/05/2014   Arteriosclerosis of coronary artery 08/05/2014   CCF (congestive cardiac failure) ([place]) 08/05/2014   Chronic kidney disease 08/05/2014   Detrusor muscle hypertonia 08/05/2014   Apnea, sleep 08/05/2014   Temporary cerebral vascular dysfunction 08/05/2014   Polypharmacy 04/29/2014   Other long term (current) drug therapy 04/29/2014   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   Chronic kidney disease, stage III (moderate) ([place]) 12/14/2013   Controlled diabetes mellitus type II without complication ([place]) 35/46/5681   Essential (primary) hypertension 12/03/2013   Adult hypothyroidism 12/03/2013   Controlled type 2 diabetes mellitus without complication ([place]) 27/51/7001    Past Surgical History:  Procedure Laterality Date   ABDOMINAL HYSTERECTOMY  CARDIAC CATHETERIZATION     HERNIA REPAIR  07/15/2017   [facility]   lumbar facet     several   MELANOMA EXCISION  03/2020   OUTSIDE OF RECTUM   prolapse rectum surgery N/A 08/2014   SPINAL CORD STIMULATOR REMOVAL N/A 11/07/2020   Procedure: REMOVAL OF THORACIC SPINAL CORD STIMULATOR & PULSE GENERATOR;  Surgeon: [name], MD;  Location: [facility] ORS;  Service: Neurosurgery;  Laterality: N/A;   THORACIC LAMINECTOMY FOR SPINAL CORD STIMULATOR N/A 07/27/2019   Procedure: THORACIC SPINAL CORD STIMULATOR PLACEMENT WITH RIGHT FLANK PULSE GENERATOR;  Surgeon: [name], MD;  Location: [facility] ORS;  Service: Neurosurgery;  Laterality: N/A;   TONSILLECTOMY      Prior to Admission medications   Medication  Sig Start Date End Date Taking? Authorizing Provider  atorvastatin (LIPITOR) 40 MG tablet Take 40 mg by mouth at bedtime. 04/05/20   [provider]  CAPLYTA 42 MG capsule Take 42 mg by mouth at bedtime. 09/20/20   [provider]  cholecalciferol (VITAMIN D3) 25 MCG (1000 UNIT) tablet Take 1,000 Units by mouth daily.    [provider]  clindamycin (CLEOCIN) 300 MG capsule Take 1 capsule (300 mg total) by mouth 3 (three) times daily for 7 days. 12/17/20 12/24/20  Lucrezia Starch, MD  clonazepam (KLONOPIN) 0.125 MG disintegrating tablet Take 0.125 mg by mouth 3 (three) times daily. 08/25/20   [provider]  cyanocobalamin 1000 MCG tablet Take 1,000 mcg by mouth daily.    [provider]  DALIRESP 500 MCG TABS tablet Take 500 mcg by mouth daily. 10/20/20   [provider]  Dulaglutide (TRULICITY) 5.10 CH/8.5ID SOPN Inject 0.75 mg into the skin every Thursday. 08/03/20   [provider]  famotidine (PEPCID) 20 MG tablet Take 40 mg by mouth at bedtime.    [provider]  furosemide (LASIX) 80 MG tablet Take 80 mg by mouth 2 (two) times daily. 09/08/20   [provider]  gabapentin (NEURONTIN) 300 MG capsule Take 300-600 mg by mouth See admin instructions. Take 300 mg in the morning and noon and 600 mg at bedtime 08/25/20   [provider]  hydrOXYzine (ATARAX/VISTARIL) 25 MG tablet Take 1 tablet (25 mg total) by mouth 2 (two) times daily as needed for anxiety. 10/04/20   Ghimire, Henreitta Leber, MD  hydrOXYzine (ATARAX/VISTARIL) 50 MG tablet Take 1 tablet (50 mg total) by mouth at bedtime. 10/04/20   Ghimire, Henreitta Leber, MD  levocetirizine (XYZAL) 5 MG tablet Take 5 mg by mouth daily. 10/18/20   [provider]  methocarbamol (ROBAXIN) 750 MG tablet Take 750 mg by mouth 3 (three) times daily. 10/18/20   [provider]  metoprolol succinate (TOPROL-XL) 50 MG 24 hr tablet Take 50 mg by mouth 2 (two) times daily.  04/29/20 04/29/21  [provider]  montelukast (SINGULAIR) 10 MG tablet Take 10 mg by mouth at bedtime.    [provider]  Multiple Vitamin (MULTIVITAMIN) tablet Take 1 tablet by mouth daily.    [provider]  nitroGLYCERIN (NITROLINGUAL) 0.4 MG/SPRAY spray Place 1 spray under the tongue as needed for chest pain. 11/02/20 11/02/21  [provider]  nitroGLYCERIN (NITROSTAT) 0.4 MG SL tablet Place 0.4 mg under the tongue every 5 (five) minutes as needed for chest pain. 04/21/20   [provider]  omeprazole (PRILOSEC) 40 MG capsule Take 40 mg by mouth 2 (two) times daily.    [provider]  orphenadrine (NORFLEX)  100 MG tablet Take 1 tablet (100 mg total) by mouth 2 (two) times daily. 11/04/20 11/04/21  Lavonia Drafts, MD  potassium chloride SA (KLOR-CON) 20 MEQ tablet Take 40 mEq by mouth 2 (two) times daily. 12/25/19   [provider]  PROAIR HFA 108 (90 Base) MCG/ACT inhaler Inhale 2 puffs into the lungs every 6 (six) hours as needed for wheezing or shortness of breath.    [provider]  promethazine (PHENERGAN) 25 MG tablet Take 25 mg by mouth every 6 (six) hours as needed for nausea/vomiting. 10/19/20   [provider]  rizatriptan (MAXALT) 10 MG tablet Take 10 mg by mouth as needed for migraine. 10/19/20   [provider]  rOPINIRole (REQUIP) 1 MG tablet Take 1 mg by mouth 3 (three) times daily. 12/10/19   [provider]  SYMBICORT 160-4.5 MCG/ACT inhaler Inhale 2 puffs into the lungs 2 (two) times daily.    [provider]  traZODone (DESYREL) 50 MG tablet Take 50-150 mg by mouth at bedtime as needed for sleep. 10/24/20   [provider]  vitamin C (ASCORBIC ACID) 500 MG tablet Take 500 mg by mouth daily.    [provider]  vitamin E 180 MG (400 UNITS) capsule Take 400 Units by mouth daily.    [provider]    Allergies Diazepam, Ziprasidone hcl, Azithromycin,  Divalproex sodium, Levofloxacin, Lisinopril, Metronidazole, Sulfa antibiotics, Valproic acid, Cephalexin, Ciprofloxacin, Doxycycline, and Penicillins  Family History  Problem Relation Age of Onset   Diabetes Mellitus II Mother    CAD Mother    Sleep apnea Mother    Osteoarthritis Mother    Osteoporosis Mother    Anxiety disorder Mother    Depression Mother    Bipolar disorder Mother    Bipolar disorder Father    Hypertension Father    Depression Father    Anxiety disorder Father    Post-traumatic stress disorder Sister     Social History Social History   Tobacco Use   Smoking status: Former    Packs/day: 0.50    Types: Cigarettes    Quit date: 09/01/2018    Years since quitting: 2.3   Smokeless tobacco: Never   Tobacco comments:    Patient quit smoking 09/01/2018  Vaping Use   Vaping Use: Never used  Substance Use Topics   Alcohol use: No    Alcohol/week: 0.0 standard drinks   Drug use: No    Review of Systems  Constitutional: Positive for fever/chills Eyes: No visual changes. ENT: Positive for sore throat.  Cardiovascular: Denies chest pain. Respiratory: Denies shortness of breath. Gastrointestinal: No abdominal pain.  Positive for nausea and vomiting. No diarrhea.  No constipation. Genitourinary: Negative for dysuria. Musculoskeletal: Negative for back pain. Skin: Negative for rash. Neurological: Negative for headaches, focal weakness or numbness ___________________________________________   PHYSICAL EXAM:  VITAL SIGNS: ED Triage Vitals  Enc Vitals Group     BP      Pulse      Resp      Temp      Temp src      SpO2      Weight      Height      Head Circumference      Peak Flow      Pain Score      Pain Loc      Pain Edu?      Excl. in [address]?     Constitutional: Alert and oriented. Uncomfortable appearing  and in no acute distress. Eyes: Conjunctivae are normal. Head: Atraumatic. Nose: No congestion/rhinnorhea. Mouth/Throat: Mucous membranes  are moist.  Oropharynx non-erythematous. Neck: No stridor.   Well healing surgical site.  Hematological/Lymphatic/Immunilogical: No cervical lymphadenopathy. Cardiovascular: Normal rate, regular rhythm. Grossly normal heart sounds.  Good peripheral circulation. Respiratory: Normal respiratory effort.  No retractions. Lungs CTAB. Gastrointestinal: Soft and nontender.  Genitourinary:  Musculoskeletal: No lower extremity tenderness nor edema.  No joint effusions. Neurologic:  Normal speech and language. No gross focal neurologic deficits are appreciated. No gait instability. Skin:  Skin is warm, dry and intact. No rash noted. Psychiatric: Mood and affect are normal. Speech and behavior are normal.  ____________________________________________   LABS (all labs ordered are listed, but only abnormal results are displayed)  Labs Reviewed  COMPREHENSIVE METABOLIC PANEL  CBC WITH DIFFERENTIAL/PLATELET   ____________________________________________  EKG  Not indicated. ____________________________________________  RADIOLOGY  ED MD interpretation:    Pending  I, Sherrie George, personally viewed and evaluated these images (plain radiographs) as part of my medical decision making, as well as reviewing the written report by the radiologist.  Official radiology report(s): No results found.  ____________________________________________   PROCEDURES  Procedure(s) performed (including Critical Care):  Procedures  ____________________________________________   INITIAL IMPRESSION / ASSESSMENT AND PLAN     50 year old female presenting to the emergency department 10 days after lingual tonsillectomy and hyoid myotomy.  See HPI for further details.  Patient states that the pain is continuous and she is unable to tolerate food or fluids.  We will get labs and review ER note from visit here on 12/17/2020.  DIFFERENTIAL DIAGNOSIS  Postop pain, abscess  ED COURSE  CT soft tissue  neck pending. Patient continues to feel nauseated, phenergan ordered. Care handed off to Dr. Archie Balboa who will follow up on results and determine disposition.     As part of my medical decision making, I reviewed the following data within the Agenda chart reviewed  ___________________________________________   FINAL CLINICAL IMPRESSION(S) / ED DIAGNOSES  Final diagnoses:  None  Post op pain.    ED Discharge Orders     None        Marie Clarke was evaluated in Emergency Department on 12/22/2020 for the symptoms described in the history of present illness. She was evaluated in the context of the global COVID-19 pandemic, which necessitated consideration that the patient might be at risk for infection with the SARS-CoV-2 virus that causes COVID-19. Institutional protocols and algorithms that pertain to the evaluation of patients at risk for COVID-19 are in a state of rapid change based on information released by regulatory bodies including the CDC and federal and state organizations. These policies and algorithms were followed during the patient's care in the ED.   Note:  This document was prepared using Dragon voice recognition software and may include unintentional dictation errors.    Victorino Dike, FNP 12/23/20 1429    Nance Pear, MD 12/23/20 [phone]

## 2020-12-22 NOTE — ED Notes (Addendum)
To radiology for ct at this time  Provider made aware pt c/o nausea. Requesting additional med this time

## 2020-12-22 NOTE — Discharge Instructions (Signed)
Please call [facility] clinic tomorrow. Please seek medical attention for any high fevers, chest pain, shortness of breath, change in behavior, persistent vomiting, bloody stool or any other new or concerning symptoms.

## 2020-12-22 NOTE — ED Triage Notes (Signed)
Pt arrives via EMS. Pt sts that she has had a tonsillectomy on 12/13/2020 and she has been in a lot of pain since than and is unable to keep any food down. Pt airway is patent at this time and is A/Ox4 NAD.

## 2021-02-14 ENCOUNTER — Telehealth: Payer: Self-pay | Admitting: Psychiatry

## 2021-02-14 NOTE — Telephone Encounter (Signed)
Patient states that she received a $50 bill for no show fee 02/25/20. She wants it removed immediately

## 2021-02-20 ENCOUNTER — Emergency Department
Admission: EM | Admit: 2021-02-20 | Discharge: 2021-02-20 | Disposition: A | Discharge status: Home / Self Care | Payer: Medicare (Managed Care) | Attending: Emergency Medicine | Admitting: Emergency Medicine

## 2021-02-20 ENCOUNTER — Other Ambulatory Visit: Payer: Self-pay

## 2021-02-20 DIAGNOSIS — H1089 Other conjunctivitis: Secondary | ICD-10-CM | POA: Diagnosis not present

## 2021-02-20 DIAGNOSIS — E119 Type 2 diabetes mellitus without complications: Secondary | ICD-10-CM | POA: Diagnosis not present

## 2021-02-20 DIAGNOSIS — I509 Heart failure, unspecified: Secondary | ICD-10-CM | POA: Diagnosis not present

## 2021-02-20 DIAGNOSIS — H1031 Unspecified acute conjunctivitis, right eye: Secondary | ICD-10-CM

## 2021-02-20 DIAGNOSIS — I2510 Atherosclerotic heart disease of native coronary artery without angina pectoris: Secondary | ICD-10-CM | POA: Insufficient documentation

## 2021-02-20 DIAGNOSIS — H5711 Ocular pain, right eye: Secondary | ICD-10-CM | POA: Diagnosis present

## 2021-02-20 DIAGNOSIS — J449 Chronic obstructive pulmonary disease, unspecified: Secondary | ICD-10-CM | POA: Diagnosis not present

## 2021-02-20 DIAGNOSIS — I110 Hypertensive heart disease with heart failure: Secondary | ICD-10-CM | POA: Diagnosis not present

## 2021-02-20 MED ORDER — EYE WASH OPHTH SOLN
1.0000 [drp] | OPHTHALMIC | Status: DC | PRN
  Administered 2021-02-20: 1 [drp] via OPHTHALMIC
  Filled 2021-02-20: qty 118

## 2021-02-20 MED ORDER — TOBRAMYCIN 0.3 % OP SOLN: 2.0000 [drp] | OPHTHALMIC | 0 refills | Status: DC

## 2021-02-20 MED ORDER — TETRACAINE HCL 0.5 % OP SOLN
1.0000 [drp] | Freq: Once | OPHTHALMIC | Status: AC
  Administered 2021-02-20: 1 [drp] via OPHTHALMIC
  Filled 2021-02-20: qty 4

## 2021-02-20 MED ORDER — FLUORESCEIN SODIUM 1 MG OP STRP
1.0000 | ORAL_STRIP | Freq: Once | OPHTHALMIC | Status: AC
  Administered 2021-02-20: 1 via OPHTHALMIC
  Filled 2021-02-20: qty 1

## 2021-02-20 NOTE — ED Notes (Signed)
See triage note  presents with pain and swelling to right eye  denies nay injury  states she woke up like that this am

## 2021-02-20 NOTE — ED Provider Notes (Signed)
[facility] Provider Note    Event Date/Time   First MD Initiated Contact with Patient 02/20/21 1258     (approximate)   History   Eye Pain   HPI  Marie Clarke is a 51 y.o. female presents to the ED with complaint of right eye pain beginning this morning.  Patient states that this began when she woke this morning.  She denies any trauma or injury to her eye.  She states that the light makes it feel worse.  She reports redness to her eye but no blurriness or decreased vision.  She was seen earlier at [facility] acute care and was brought to the emergency department as she states they were unable to get her eye numbed enough for her to be examined.  Patient has a history of coronary artery disease, CHF, chronic pain syndrome, COPD, depression, diabetes, CVA, drug overdose and MI 2007.  Patient is also bipolar, sleep apnea, anxiety, PTSD, hypertension, osteoporosis, TIA and suicidal ideation.  Information was noted in her past medical records.      Physical Exam   Triage Vital Signs: ED Triage Vitals  Enc Vitals Group     BP 02/20/21 1140 123/82     Pulse Rate 02/20/21 1140 (!) 102     Resp 02/20/21 1140 18     Temp 02/20/21 1140 98 F (36.7 C)     Temp src --      SpO2 02/20/21 1140 94 %     Weight 02/20/21 1237 235 lb 0.2 oz (106.6 kg)     Height 02/20/21 1237 5\' 3"  (1.6 m)     Head Circumference --      Peak Flow --      Pain Score 02/20/21 1139 2     Pain Loc --      Pain Edu? --      Excl. in [place]? --     Most recent vital signs: Vitals:   02/20/21 1140 02/20/21 1426  BP: 123/82 126/78  Pulse: (!) 102 94  Resp: 18 20  Temp: 98 F (36.7 C)   SpO2: 94% 95%     General: Awake, no distress.  CV:  Good peripheral perfusion.  Resp:  Normal effort.  Lungs are clear bilaterally. Abd:  No distention. Other:  On examination of the right eye there is some minimal injection noted.   ED Results / Procedures / Treatments   Labs (all labs  ordered are listed, but only abnormal results are displayed) Labs Reviewed - No data to display   PROCEDURES:  Critical Care performed: No  Procedures Right eye exam. Tetracaine was applied to the right eye and eye was examined without any difficulties.  Upper lid was everted and no foreign body was noted.  There was yellow exudate and crusting in the lashes.  There was moderate amount of yellow thick exudate noted in the corner of the right eye.  Fluorescein stain did not show any corneal abrasions.  Eye was irrigated with eyewash to remove foreseen and again checked.  Patient did not experience any continued pain or discomfort.  MEDICATIONS ORDERED IN ED: Medications  eye wash ((SODIUM/POTASSIUM/SOD CHLORIDE)) ophthalmic solution 1 drop (1 drop Right Eye Given 02/20/21 1358)  tetracaine (PONTOCAINE) 0.5 % ophthalmic solution 1 drop (1 drop Right Eye Given 02/20/21 1357)  fluorescein ophthalmic strip 1 strip (1 strip Left Eye Given 02/20/21 1357)     IMPRESSION / MDM / ASSESSMENT AND PLAN / ED COURSE  I reviewed the triage vital signs and the nursing notes.                              Differential diagnosis includes, but is not limited to, foreign body, corneal abrasion, viral or bacterial conjunctivitis.  51 year old female presents to the ED with complaint of right eye redness that she noticed this morning when she woke up.  She denies any foreign body sensation.  Apparently she was over at [facility] earlier today and they were unable to examine her eye due to her tolerance for examination.  Tetracaine was applied to the eye and there was no continued difficulty in examining her eye.  No foreign body was noted.  Lid was everted and no foreign body present.  No corneal abrasion seen.  Eye was irrigated without complications.  There was yellow exudate present and crusting of the lashes that was noted on exam.  Patient was started on tobramycin ophthalmic solution for the right eye.  The  prescription was sent to the pharmacy.  Patient states that she is unable to do the visual acuity because she does not want to take the gauze off of her right eye.  She was to follow-up with the ophthalmologist, Dr. Edison Pace who is on-call for ophthalmology today if she continues to have any problems.   FINAL CLINICAL IMPRESSION(S) / ED DIAGNOSES   Final diagnoses:  Acute bacterial conjunctivitis of right eye     Rx / DC Orders   ED Discharge Orders          Ordered    tobramycin (TOBREX) 0.3 % ophthalmic solution  Every 4 hours        02/20/21 1407             Note:  This document was prepared using Dragon voice recognition software and may include unintentional dictation errors.   [name], PA-C 02/20/21 1525    Blake Divine, MD 02/21/21 1000

## 2021-02-20 NOTE — ED Triage Notes (Signed)
Pt comes with c/o right eye pain since  this am. Pt states light makes it worse. Pt does have some redness noted.

## 2021-02-20 NOTE — Discharge Instructions (Addendum)
Call make an appoint with Dr. Edison Pace who is the ophthalmologist on-call if you continue to have problems with your eye.  He has not specialist.  Let the office know that you have already been seen in the emergency department and [facility].  Began using eyedrops to your right eye every 4 hours while you are awake.  Continue taking your regular medication as prescribed by your primary care provider.

## 2021-03-18 ENCOUNTER — Emergency Department
Admission: EM | Admit: 2021-03-18 | Discharge: 2021-03-18 | Disposition: A | Discharge status: Home / Self Care | Payer: Medicare (Managed Care) | Attending: Emergency Medicine | Admitting: Emergency Medicine

## 2021-03-18 ENCOUNTER — Telehealth: Payer: Self-pay | Admitting: Student in an Organized Health Care Education/Training Program

## 2021-03-18 ENCOUNTER — Emergency Department: Payer: Medicare (Managed Care)

## 2021-03-18 DIAGNOSIS — R059 Cough, unspecified: Secondary | ICD-10-CM | POA: Insufficient documentation

## 2021-03-18 DIAGNOSIS — I129 Hypertensive chronic kidney disease with stage 1 through stage 4 chronic kidney disease, or unspecified chronic kidney disease: Secondary | ICD-10-CM | POA: Diagnosis not present

## 2021-03-18 DIAGNOSIS — R052 Subacute cough: Secondary | ICD-10-CM

## 2021-03-18 DIAGNOSIS — E1122 Type 2 diabetes mellitus with diabetic chronic kidney disease: Secondary | ICD-10-CM | POA: Diagnosis not present

## 2021-03-18 DIAGNOSIS — N189 Chronic kidney disease, unspecified: Secondary | ICD-10-CM | POA: Diagnosis not present

## 2021-03-18 DIAGNOSIS — R0602 Shortness of breath: Secondary | ICD-10-CM | POA: Insufficient documentation

## 2021-03-18 DIAGNOSIS — Z8616 Personal history of COVID-19: Secondary | ICD-10-CM | POA: Insufficient documentation

## 2021-03-18 DIAGNOSIS — R0600 Dyspnea, unspecified: Secondary | ICD-10-CM

## 2021-03-18 LAB — COMPREHENSIVE METABOLIC PANEL
ALT: 22 U/L (ref 0–44)
AST: 21 U/L (ref 15–41)
Albumin: 4 g/dL (ref 3.5–5.0)
Alkaline Phosphatase: 134 U/L — ABNORMAL HIGH (ref 38–126)
Anion gap: 9 (ref 5–15)
BUN: 14 mg/dL (ref 6–20)
CO2: 29 mmol/L (ref 22–32)
Calcium: 9.4 mg/dL (ref 8.9–10.3)
Chloride: 101 mmol/L (ref 98–111)
Creatinine, Ser: 0.89 mg/dL (ref 0.44–1.00)
GFR, Estimated: >60 mL/min (ref >60)
Glucose, Bld: 119 mg/dL — ABNORMAL HIGH (ref 70–99)
Potassium: 3 mmol/L — ABNORMAL LOW (ref 3.5–5.1)
Sodium: 139 mmol/L (ref 135–145)
Total Bilirubin: 0.6 mg/dL (ref 0.3–1.2)
Total Protein: 8.4 g/dL — ABNORMAL HIGH (ref 6.5–8.1)

## 2021-03-18 LAB — CBC WITH DIFFERENTIAL/PLATELET
Abs Immature Granulocytes: 0.01 10*3/uL (ref 0.00–0.07)
Basophils Absolute: 0 10*3/uL (ref 0.0–0.1)
Basophils Relative: 0 %
Eosinophils Absolute: 0.1 10*3/uL (ref 0.0–0.5)
Eosinophils Relative: 1 %
HCT: 37.5 % (ref 36.0–46.0)
Hemoglobin: 12.1 g/dL (ref 12.0–15.0)
Immature Granulocytes: 0 %
Lymphocytes Relative: 34 %
Lymphs Abs: 2.5 10*3/uL (ref 0.7–4.0)
MCH: 29.4 pg (ref 26.0–34.0)
MCHC: 32.3 g/dL (ref 30.0–36.0)
MCV: 91.2 fL (ref 80.0–100.0)
Monocytes Absolute: 0.4 10*3/uL (ref 0.1–1.0)
Monocytes Relative: 6 %
Neutro Abs: 4.3 10*3/uL (ref 1.7–7.7)
Neutrophils Relative %: 59 %
Platelets: 271 10*3/uL (ref 150–400)
RBC: 4.11 MIL/uL (ref 3.87–5.11)
RDW: 13.7 % (ref 11.5–15.5)
WBC: 7.3 10*3/uL (ref 4.0–10.5)
nRBC: 0 % (ref 0.0–0.2)

## 2021-03-18 LAB — LACTIC ACID, PLASMA
Lactic Acid, Venous: 2.8 mmol/L (ref 0.5–1.9)
Lactic Acid, Venous: 0.9 mmol/L (ref 0.5–1.9)

## 2021-03-18 LAB — TROPONIN I (HIGH SENSITIVITY)
Troponin I (High Sensitivity): 3 ng/L (ref <18)
Troponin I (High Sensitivity): 2 ng/L (ref <18)

## 2021-03-18 LAB — D-DIMER, QUANTITATIVE: D-Dimer, Quant: 0.41 ug/mL-FEU (ref 0.00–0.50)

## 2021-03-18 LAB — PROCALCITONIN: Procalcitonin: <0.1 ng/mL

## 2021-03-18 MED ORDER — AZITHROMYCIN 250 MG PO TABS: ORAL_TABLET | ORAL | 0 refills | Status: DC

## 2021-03-18 MED ORDER — PREDNISONE 20 MG PO TABS: 40.0000 mg | ORAL_TABLET | Freq: Every day | ORAL | 0 refills | Status: DC

## 2021-03-18 MED ORDER — PREDNISONE 20 MG PO TABS
40.0000 mg | ORAL_TABLET | Freq: Once | ORAL | Status: AC
Start: 2021-03-18 — End: 2021-03-18
  Administered 2021-03-18: 40 mg via ORAL
  Filled 2021-03-18: qty 2

## 2021-03-18 MED ORDER — IPRATROPIUM-ALBUTEROL 0.5-2.5 (3) MG/3ML IN SOLN
3.0000 mL | Freq: Once | RESPIRATORY_TRACT | Status: AC
  Administered 2021-03-18: 3 mL via RESPIRATORY_TRACT

## 2021-03-18 MED ORDER — PREDNISONE 20 MG PO TABS: 40.0000 mg | ORAL_TABLET | Freq: Every day | ORAL | 0 refills | Status: AC

## 2021-03-18 MED ORDER — AZITHROMYCIN 500 MG PO TABS: 500.0000 mg | ORAL_TABLET | Freq: Once | ORAL | Status: DC

## 2021-03-18 MED ORDER — POTASSIUM CHLORIDE CRYS ER 20 MEQ PO TBCR
40.0000 meq | EXTENDED_RELEASE_TABLET | Freq: Once | ORAL | Status: AC
  Administered 2021-03-18: 40 meq via ORAL

## 2021-03-18 MED ORDER — SODIUM CHLORIDE 0.9 % IV BOLUS
1000.0000 mL | Freq: Once | INTRAVENOUS | Status: AC
  Administered 2021-03-18: 1000 mL via INTRAVENOUS

## 2021-03-18 MED ORDER — SODIUM CHLORIDE 0.9 % IV BOLUS: 1000.0000 mL | Freq: Once | INTRAVENOUS | Status: DC

## 2021-03-18 MED ORDER — SODIUM CHLORIDE 0.9 % IV BOLUS
500.0000 mL | Freq: Once | INTRAVENOUS | Status: AC
  Administered 2021-03-18: 500 mL via INTRAVENOUS

## 2021-03-18 NOTE — ED Notes (Signed)
Discharge instructions, script instructions and follow-up information provided to patient. Patient verbalized understanding. Patient able to get dressed  without c/o shob. Patient was also reviewing her chart as she was getting dressed and walking out the door. Offered to take her out but she stated she would be fine.

## 2021-03-18 NOTE — ED Triage Notes (Signed)
At [facility], sob, pneumonia , sats 98% sugar 134,

## 2021-03-18 NOTE — Telephone Encounter (Signed)
Pred sent to [facility]

## 2021-03-18 NOTE — ED Notes (Signed)
Upon arrival to the patient's room, patient was sound asleep. This nurse noted that she had a room air sat of 98% and no shob noted. The patient awoke after I called her name a couple of times, she awoke. I introduced myself and told her that I had to draw a repeat lab and start a liter of fluids. I asked if she was having any difficulty breathing and she stated no. The provider came in after that and she then told him that she was having shob. He reassessed her. Her sats continued to be 97-98% on room air. He told her that her lungs sounded fine. When he left the room, the patient asked that the head of the bed be let down flat and I told her that might increase her shob and she stated she was fine. I lowered her head and she closed her eyes.

## 2021-03-18 NOTE — ED Notes (Signed)
Patient ambulated on room air and sats were 97-98%. Patient had shob  until I mentioned that if all of her test results were normal she might go home. Patient states she is afraid to go home because the [facility] doc told her she had pneumonia and we told her she does not. Patient has been able to sleep flat while here. This nurse had a long discussion regarding her concerns. Patient seemed very defensive and even cut me off several times when asking questions.

## 2021-03-18 NOTE — ED Triage Notes (Signed)
[facility], sob

## 2021-03-18 NOTE — ED Provider Notes (Addendum)
[facility] Provider Note    Event Date/Time   First MD Initiated Contact with Patient 03/18/21 1711     (approximate)   History   Chief Complaint Shortness of Breath (At [facility], sob, pneumonia , sats 98% sugar 134, )   HPI Marie Clarke is a 51 y.o. female, history of hypertension, CKD, diabetes, hyperlipidemia, presents to the emergency department for evaluation of shortness of breath.  Patient states that she has been experiencing cough, congestion, shortness of breath, and centralized chest pain for the past 4 days.  Additionally endorses chills.  She states that she was evaluated at urgent care earlier today where they performed a chest x-ray which showed findings suggestive of pneumonia.  Given her extensive allergy list to most antibiotics, they referred her to the emergency department as she is needed supervision during antibiotic administration.  She states that her reactions to antibiotics include rash, tongue swelling, and shortness of breath.   History Limitations: No limitations.      Physical Exam  Triage Vital Signs: ED Triage Vitals  Enc Vitals Group     BP      Pulse      Resp      Temp      Temp src      SpO2      Weight      Height      Head Circumference      Peak Flow      Pain Score      Pain Loc      Pain Edu?      Excl. in [place]?     Most recent vital signs: Vitals:   03/18/21 1711  BP: 119/77  Pulse: 100  Resp: 18  Temp: 98.7 F (37.1 C)  SpO2: 99%    General: Awake, NAD.  CV: Good peripheral perfusion.  S1 and S2 present.  No murmurs rubs or gallops. Resp: Normal effort.  Lung sounds clear bilaterally. Abd: Soft, non-tender. No distention.  Neuro: At baseline. No gross neurological deficits. Other: No chest wall tenderness  Physical Exam    ED Results / Procedures / Treatments  Labs (all labs ordered are listed, but only abnormal results are displayed) Labs Reviewed  COMPREHENSIVE METABOLIC PANEL -  Abnormal; Notable for the following components:      Result Value   Potassium 3.0 (*)    Glucose, Bld 119 (*)    Total Protein 8.4 (*)    Alkaline Phosphatase 134 (*)    All other components within normal limits  LACTIC ACID, PLASMA - Abnormal; Notable for the following components:   Lactic Acid, Venous 2.8 (*)    All other components within normal limits  CULTURE, BLOOD (ROUTINE X 2)  CULTURE, BLOOD (ROUTINE X 2)  CBC WITH DIFFERENTIAL/PLATELET  LACTIC ACID, PLASMA  PROCALCITONIN  TROPONIN I (HIGH SENSITIVITY)  TROPONIN I (HIGH SENSITIVITY)     EKG Sinus tachycardia, rate of 104, no ST segment elevations, no axis deviations, no AV blocks.   RADIOLOGY  ED Provider Interpretation: I personally reviewed these images.  Chest x-ray shows no evidence of acute cardiopulmonary disease  DG Chest 2 View  Result Date: 03/18/2021 CLINICAL DATA:  Shortness of breath EXAM: CHEST - 2 VIEW COMPARISON:  12/16/2020 FINDINGS: The cardiomediastinal silhouette is unremarkable. Mild chronic peribronchial thickening again noted. There is no evidence of focal airspace disease, pulmonary edema, suspicious pulmonary nodule/mass, pleural effusion, or pneumothorax. No acute bony abnormalities are identified. IMPRESSION:  No active cardiopulmonary disease. Mild chronic peribronchial thickening. Electronically Signed   By: Margarette Canada M.D.   On: 03/18/2021 18:45    PROCEDURES:  Critical Care performed: None.  Procedures    MEDICATIONS ORDERED IN ED: Medications  ipratropium-albuterol (DUONEB) 0.5-2.5 (3) MG/3ML nebulizer solution 3 mL (3 mLs Nebulization Given 03/18/21 1750)  sodium chloride 0.9 % bolus 500 mL (0 mLs Intravenous Stopped 03/18/21 1838)  potassium chloride SA (KLOR-CON M) CR tablet 40 mEq (40 mEq Oral Given 03/18/21 1838)     IMPRESSION / MDM / ASSESSMENT AND PLAN / ED COURSE  I reviewed the triage vital signs and the nursing notes.                              Marie Clarke is a 51 y.o.  female, history of bipolar type 1, hypertension, CKD, diabetes, hyperlipidemia, presents to the emergency department for evaluation of shortness of breath.  Patient states that she has been experiencing cough, congestion, shortness of breath, and centralized chest pain for the past 4 days.  Additionally endorses chills.  She states that she was evaluated at urgent care earlier today where they performed a chest x-ray which showed findings suggestive of pneumonia.  Given her extensive allergy list to most antibiotics, they referred her to the emergency department as she is needed supervision during antibiotic administration.  Differential diagnosis includes, but is not limited to, ACS, pneumonia, bronchitis, COPD exacerbation, COVID-19/influenza.  ED Course Patient appears well.  Vital signs are within normal limits, though notably tachycardic at 101.  She appears comfortable. NAD.  Given her tachycardia and also shortness of breath, we will go ahead and initiate moderate fluid bolus and DuoNeb treatment.  Respiratory panel at [facility] negative for COVID-19 or influenza.  Strep PCR negative as well  CBC unremarkable for leukocytosis or anemia.   Initial lactate elevated at 2.8.  We will give more IV fluids and reevaluate.   Initial troponin 3.  Second troponin 2. EKG unremarkable.  Unlikely ACS or myocarditis/pericarditis  Chest x-ray shows no acute cardiopulmonary findings.   Assessment/Plan Patient is stable and comfortable.  Although chest x-ray of [facility] shows findings suspicious for pneumonia, chest x-ray here is reassuring.  Her presentation and work-up thus far not suggestive of serious or life-threatening pathology.  I suspect the patient's symptoms are likely due to a viral respiratory infection.    Pending second lactate and Sp02 check while ambulating. Patient care transferred over to Dr. Merlyn Lot at 2010.    FINAL CLINICAL IMPRESSION(S) / ED DIAGNOSES   Final  diagnoses:  None     Rx / DC Orders   ED Discharge Orders     None        Note:  This document was prepared using Dragon voice recognition software and may include unintentional dictation errors.   Teodoro Spray, PA 03/18/21 2009    Mardee Postin [place], [place] 03/18/21 2010    Merlyn Lot, MD 03/18/21 2021    Merlyn Lot, MD 03/18/21 2118

## 2021-03-22 ENCOUNTER — Other Ambulatory Visit
Admission: RE | Admit: 2021-03-22 | Discharge: 2021-03-22 | Disposition: A | Discharge status: Home / Self Care | Payer: Medicare (Managed Care) | Source: Ambulatory Visit | Attending: Internal Medicine | Admitting: Internal Medicine

## 2021-03-22 DIAGNOSIS — R051 Acute cough: Secondary | ICD-10-CM | POA: Insufficient documentation

## 2021-03-22 DIAGNOSIS — R0602 Shortness of breath: Secondary | ICD-10-CM | POA: Insufficient documentation

## 2021-03-22 LAB — BRAIN NATRIURETIC PEPTIDE: B Natriuretic Peptide: 16.5 pg/mL (ref 0.0–100.0)

## 2021-03-23 LAB — CULTURE, BLOOD (ROUTINE X 2)
Culture: NO GROWTH
Culture: NO GROWTH
Special Requests: ADEQUATE
Special Requests: ADEQUATE

## 2021-03-25 ENCOUNTER — Other Ambulatory Visit: Payer: Self-pay

## 2021-03-25 ENCOUNTER — Emergency Department: Payer: Medicare (Managed Care)

## 2021-03-25 ENCOUNTER — Emergency Department
Admission: EM | Admit: 2021-03-25 | Discharge: 2021-03-26 | Disposition: A | Discharge status: Home / Self Care | Payer: Medicare (Managed Care) | Attending: Emergency Medicine | Admitting: Emergency Medicine

## 2021-03-25 DIAGNOSIS — E039 Hypothyroidism, unspecified: Secondary | ICD-10-CM | POA: Insufficient documentation

## 2021-03-25 DIAGNOSIS — I110 Hypertensive heart disease with heart failure: Secondary | ICD-10-CM | POA: Insufficient documentation

## 2021-03-25 DIAGNOSIS — K439 Ventral hernia without obstruction or gangrene: Secondary | ICD-10-CM | POA: Insufficient documentation

## 2021-03-25 DIAGNOSIS — I2510 Atherosclerotic heart disease of native coronary artery without angina pectoris: Secondary | ICD-10-CM | POA: Insufficient documentation

## 2021-03-25 DIAGNOSIS — R1031 Right lower quadrant pain: Secondary | ICD-10-CM | POA: Diagnosis present

## 2021-03-25 DIAGNOSIS — Z85820 Personal history of malignant melanoma of skin: Secondary | ICD-10-CM | POA: Diagnosis not present

## 2021-03-25 DIAGNOSIS — I509 Heart failure, unspecified: Secondary | ICD-10-CM | POA: Diagnosis not present

## 2021-03-25 DIAGNOSIS — D72829 Elevated white blood cell count, unspecified: Secondary | ICD-10-CM | POA: Diagnosis not present

## 2021-03-25 DIAGNOSIS — J449 Chronic obstructive pulmonary disease, unspecified: Secondary | ICD-10-CM | POA: Insufficient documentation

## 2021-03-25 DIAGNOSIS — E119 Type 2 diabetes mellitus without complications: Secondary | ICD-10-CM | POA: Insufficient documentation

## 2021-03-25 DIAGNOSIS — J45909 Unspecified asthma, uncomplicated: Secondary | ICD-10-CM | POA: Diagnosis not present

## 2021-03-25 DIAGNOSIS — R197 Diarrhea, unspecified: Secondary | ICD-10-CM | POA: Insufficient documentation

## 2021-03-25 LAB — CBC
HCT: 36.8 % (ref 36.0–46.0)
Hemoglobin: 11.9 g/dL — ABNORMAL LOW (ref 12.0–15.0)
MCH: 29 pg (ref 26.0–34.0)
MCHC: 32.3 g/dL (ref 30.0–36.0)
MCV: 89.8 fL (ref 80.0–100.0)
Platelets: 349 10*3/uL (ref 150–400)
RBC: 4.1 MIL/uL (ref 3.87–5.11)
RDW: 13.8 % (ref 11.5–15.5)
WBC: 11.8 10*3/uL — ABNORMAL HIGH (ref 4.0–10.5)
nRBC: 0 % (ref 0.0–0.2)

## 2021-03-25 LAB — COMPREHENSIVE METABOLIC PANEL
ALT: 20 U/L (ref 0–44)
AST: 16 U/L (ref 15–41)
Albumin: 4 g/dL (ref 3.5–5.0)
Alkaline Phosphatase: 121 U/L (ref 38–126)
Anion gap: 12 (ref 5–15)
BUN: 25 mg/dL — ABNORMAL HIGH (ref 6–20)
CO2: 27 mmol/L (ref 22–32)
Calcium: 8.8 mg/dL — ABNORMAL LOW (ref 8.9–10.3)
Chloride: 97 mmol/L — ABNORMAL LOW (ref 98–111)
Creatinine, Ser: 1.17 mg/dL — ABNORMAL HIGH (ref 0.44–1.00)
GFR, Estimated: 57 mL/min — ABNORMAL LOW (ref >60)
Glucose, Bld: 126 mg/dL — ABNORMAL HIGH (ref 70–99)
Potassium: 3.2 mmol/L — ABNORMAL LOW (ref 3.5–5.1)
Sodium: 136 mmol/L (ref 135–145)
Total Bilirubin: 0.6 mg/dL (ref 0.3–1.2)
Total Protein: 7.4 g/dL (ref 6.5–8.1)

## 2021-03-25 LAB — URINALYSIS, ROUTINE W REFLEX MICROSCOPIC
Bilirubin Urine: NEGATIVE
Glucose, UA: NEGATIVE mg/dL
Hgb urine dipstick: NEGATIVE
Ketones, ur: NEGATIVE mg/dL
Leukocytes,Ua: NEGATIVE
Nitrite: NEGATIVE
Protein, ur: NEGATIVE mg/dL
Specific Gravity, Urine: 1.018 (ref 1.005–1.030)
pH: 5 (ref 5.0–8.0)

## 2021-03-25 LAB — LIPASE, BLOOD: Lipase: 38 U/L (ref 11–51)

## 2021-03-25 MED ORDER — ONDANSETRON 4 MG PO TBDP
4.0000 mg | ORAL_TABLET | Freq: Once | ORAL | Status: AC | PRN
  Administered 2021-03-25: 4 mg via ORAL
  Filled 2021-03-25: qty 1

## 2021-03-25 MED ORDER — LACTATED RINGERS IV BOLUS
1000.0000 mL | Freq: Once | INTRAVENOUS | Status: AC
  Administered 2021-03-26: 1000 mL via INTRAVENOUS

## 2021-03-25 MED ORDER — IOHEXOL 300 MG/ML  SOLN
100.0000 mL | Freq: Once | INTRAMUSCULAR | Status: AC | PRN
  Administered 2021-03-26: 100 mL via INTRAVENOUS
  Filled 2021-03-25: qty 100

## 2021-03-25 MED ORDER — ONDANSETRON HCL 4 MG/2ML IJ SOLN
4.0000 mg | Freq: Once | INTRAMUSCULAR | Status: AC
  Administered 2021-03-26: 4 mg via INTRAVENOUS
  Filled 2021-03-25: qty 2

## 2021-03-25 MED ORDER — FENTANYL CITRATE PF 50 MCG/ML IJ SOSY
50.0000 ug | PREFILLED_SYRINGE | Freq: Once | INTRAMUSCULAR | Status: AC
  Administered 2021-03-26: 50 ug via INTRAVENOUS
  Filled 2021-03-25: qty 1

## 2021-03-25 NOTE — ED Triage Notes (Signed)
Pt states she has been having abd pain on the RLQ starting today. She states she has been having diarrhea and then having to disimpact herself for the last 2 weeks. Pt has Hx of a bowel blockage and reports similar pain as then. Pt reports nausea.

## 2021-03-26 DIAGNOSIS — K439 Ventral hernia without obstruction or gangrene: Secondary | ICD-10-CM | POA: Diagnosis not present

## 2021-03-26 NOTE — ED Notes (Signed)
Patient transported to CT 

## 2021-03-26 NOTE — ED Provider Notes (Signed)
[facility] Provider Note    Event Date/Time   First MD Initiated Contact with Patient 03/25/21 2344/05/19     (approximate)   History   Abdominal Pain   HPI  Marie Clarke is a 51 y.o. female with a history of chronic pain, diabetes, obesity, CHF, anemia, GERD, pancreatitis who presents for evaluation of abdominal pain.  Patient reports that she has had this abdominal pain on a weekly basis for over a year.  This time around the pain started 2 days ago.  Initially was diffuse crampy abdominal pain with diarrhea.  Over the last 24 hours the pain has moved to the right lower quadrant.  The pain is sharp and constant.  She reports that the diarrhea turned into mild constipation and she had 1 bowel movement that was stuck in her rectum earlier today that she had to pull it out.  She is passing flatus, denies vomiting but has had nausea.  No dysuria or hematuria, no chest pain or shortness of breath     Past Medical History:  Diagnosis Date   Anemia    Anginal pain (HCC)    Anxiety    Arthritis    Asthma    Bilateral lower extremity edema    Bipolar disorder (HCC)    CAD (coronary artery disease) unk   CHF (congestive heart failure) (HCC)    Chronic pain syndrome    COPD (chronic obstructive pulmonary disease) ([place])    emphysema   DDD (degenerative disc disease), lumbar    Depression unk   Diabetes mellitus without complication ([place])    Diabetes mellitus, type II ([place])    Drug overdose 05/19/2013   after dad died   Dysrhythmia    history of tachy arrythmias   Fibromyalgia    GERD (gastroesophageal reflux disease)    Headache    chronic migraines   Hyperlipidemia    Hypertension    Hypothyroidism    Left leg pain 04/29/2014   MI (myocardial infarction) ([place]) 2005-05-19   Muscle ache 09/16/2014   Osteoporosis    Overactive bladder    Pancreatitis unk   Panic attacks    PTSD (post-traumatic stress disorder)    Reflex sympathetic dystrophy    Renal cyst, left     Renal insufficiency    ckd stage iii   Restless legs syndrome    Sleep apnea 06/2019   uses cpap   Sleep apnea    Stroke ([place]) 05-19-2008   TIA x 2. no residual deficits   Suicide attempt [facility])    after father died.   Thyroid disease    thyroid nodule   TIA (transient ischemic attack) unk   TIA (transient ischemic attack)     Past Surgical History:  Procedure Laterality Date   ABDOMINAL HYSTERECTOMY     CARDIAC CATHETERIZATION     HERNIA REPAIR  07/15/2017   [facility]   lumbar facet     several   MELANOMA EXCISION  03/2020   OUTSIDE OF RECTUM   prolapse rectum surgery N/A 08/2014   SPINAL CORD STIMULATOR REMOVAL N/A 11/07/2020   Procedure: REMOVAL OF THORACIC SPINAL CORD STIMULATOR & PULSE GENERATOR;  Surgeon: [name], MD;  Location: [facility] ORS;  Service: Neurosurgery;  Laterality: N/A;   THORACIC LAMINECTOMY FOR SPINAL CORD STIMULATOR N/A 07/27/2019   Procedure: THORACIC SPINAL CORD STIMULATOR PLACEMENT WITH RIGHT FLANK PULSE GENERATOR;  Surgeon: [name], MD;  Location: [facility] ORS;  Service: Neurosurgery;  Laterality: N/A;  TONSILLECTOMY       Physical Exam   Triage Vital Signs: ED Triage Vitals [03/25/21 2132]  Enc Vitals Group     BP 108/71     Pulse Rate 95     Resp 18     Temp 98.3 F (36.8 C)     Temp Source Oral     SpO2 96 %     Weight 235 lb 0.2 oz (106.6 kg)     Height      Head Circumference      Peak Flow      Pain Score 10     Pain Loc      Pain Edu?      Excl. in [place]?     Most recent vital signs: Vitals:   03/26/21 0100 03/26/21 0230  BP: 111/76 114/77  Pulse: 89 88  Resp: 18 17  Temp:    SpO2: 95% 96%     Constitutional: Alert and oriented. Well appearing and in no apparent distress. HEENT:      Head: Normocephalic and atraumatic.         Eyes: Conjunctivae are normal. Sclera is non-icteric.       Mouth/Throat: Mucous membranes are moist.       Neck: Supple with no signs of meningismus. Cardiovascular: Regular rate and rhythm. No  murmurs, gallops, or rubs. 2+ symmetrical distal pulses are present in all extremities.  Respiratory: Normal respiratory effort. Lungs are clear to auscultation bilaterally.  Gastrointestinal: Limited exam due to body habitus, patient is tender to palpation on the right lower quadrant, non distended with positive bowel sounds. No rebound or guarding. Genitourinary: No CVA tenderness. Musculoskeletal:  No edema, cyanosis, or erythema of extremities. Neurologic: Normal speech and language. Face is symmetric. Moving all extremities. No gross focal neurologic deficits are appreciated. Skin: Skin is warm, dry and intact. No rash noted. Psychiatric: Mood and affect are normal. Speech and behavior are normal.  ED Results / Procedures / Treatments   Labs (all labs ordered are listed, but only abnormal results are displayed) Labs Reviewed  COMPREHENSIVE METABOLIC PANEL - Abnormal; Notable for the following components:      Result Value   Potassium 3.2 (*)    Chloride 97 (*)    Glucose, Bld 126 (*)    BUN 25 (*)    Creatinine, Ser 1.17 (*)    Calcium 8.8 (*)    GFR, Estimated 57 (*)    All other components within normal limits  CBC - Abnormal; Notable for the following components:   WBC 11.8 (*)    Hemoglobin 11.9 (*)    All other components within normal limits  URINALYSIS, ROUTINE W REFLEX MICROSCOPIC - Abnormal; Notable for the following components:   Color, Urine YELLOW (*)    APPearance CLEAR (*)    All other components within normal limits  LIPASE, BLOOD  POC URINE PREG, ED     EKG  ED ECG REPORT I, Rudene Re, the attending physician, personally viewed and interpreted this ECG.  Normal sinus rhythm, no ST elevations or depressions.   RADIOLOGY I, Rudene Re, attending MD, have personally viewed and interpreted the images obtained during this visit as below:  CT showing a unchanged ventral hernia with no signs of  SBO   ___________________________________________________ Interpretation by Radiologist:  CT ABDOMEN PELVIS W CONTRAST  Result Date: 03/26/2021 CLINICAL DATA:  Abdominal pain. EXAM: CT ABDOMEN AND PELVIS WITH CONTRAST TECHNIQUE: Multidetector CT imaging of the abdomen and pelvis  was performed using the standard protocol following bolus administration of intravenous contrast. RADIATION DOSE REDUCTION: This exam was performed according to the departmental dose-optimization program which includes automated exposure control, adjustment of the mA and/or kV according to patient size and/or use of iterative reconstruction technique. CONTRAST:  114mL OMNIPAQUE IOHEXOL 300 MG/ML  SOLN COMPARISON:  CT dated 09/30/2020. FINDINGS: Lower chest: Focal area of scarring in the lingula along the fissure. The visualized lung bases are otherwise clear. No intra-abdominal free air or free fluid. Hepatobiliary: Mild fatty liver. No intrahepatic biliary dilatation. The gallbladder is unremarkable. Pancreas: Unremarkable. No pancreatic ductal dilatation or surrounding inflammatory changes. Spleen: Normal in size without focal abnormality. Adrenals/Urinary Tract: The right adrenal gland is unremarkable. Indeterminate 2 cm left adrenal nodule. Bilateral renal parenchymal lobulation and cortical scarring. There is no hydronephrosis on either side. There is symmetric enhancement of the renal parenchyma. The visualized ureters and urinary bladder appear unremarkable. Stomach/Bowel: There is moderate stool throughout the colon. There is a right paraincisional ventral hernia containing loop of small bowel. No evidence of obstruction. There is abutment of several loops of small bowel to the anterior peritoneal wall consistent with adhesions. The appendix is unremarkable. Postsurgical changes of the rectosigmoid with anastomotic suture. Several small scattered colonic diverticula without active inflammatory changes. Vascular/Lymphatic: The  abdominal aorta and IVC are unremarkable. No portal venous gas. There is no adenopathy. Reproductive: Hysterectomy.  No adnexal masses. Other: Midline vertical anterior abdominal wall incisional scar. Musculoskeletal: Osteopenia with degenerative changes of the spine. Moderate arthritic changes of the left hip. No acute osseous pathology. IMPRESSION: 1. Right paraincisional ventral hernia containing loop of small bowel. No evidence of obstruction. 2. Colonic diverticulosis. 3. Mild fatty liver. 4. Indeterminate 2 cm left adrenal nodule. Electronically Signed   By: Anner Crete M.D.   On: 03/26/2021 00:37      PROCEDURES:  Critical Care performed: No  Procedures    IMPRESSION / MDM / ASSESSMENT AND PLAN / ED COURSE  I reviewed the triage vital signs and the nursing notes.  51 y.o. female with a history of chronic pain, diabetes, obesity, CHF, anemia, GERD, pancreatitis who presents for evaluation of abdominal pain.  Patient presents with acute on chronic abdominal pain.  She is well-appearing in no distress with normal vital signs.  Abdomen is soft with right lower quadrant tenderness.  No hernias are palpable on exam although exam is limited due to body habitus.  Ddx: Appendicitis versus colitis versus diverticulitis versus SBO versus ventral hernia versus kidney stone versus UTI versus pyelonephritis   Plan: CBC, CMP, lipase, urinalysis, CT abdomen pelvis.  We will give a dose of IV fentanyl, IV fluids and Zofran for symptom relief   MEDICATIONS GIVEN IN ED: Medications  ondansetron (ZOFRAN-ODT) disintegrating tablet 4 mg (4 mg Oral Given 03/25/21 2225)  lactated ringers bolus 1,000 mL (0 mLs Intravenous Stopped 03/26/21 0250)  fentaNYL (SUBLIMAZE) injection 50 mcg (50 mcg Intravenous Given 03/26/21 0000)  ondansetron (ZOFRAN) injection 4 mg (4 mg Intravenous Given 03/26/21 0000)  iohexol (OMNIPAQUE) 300 MG/ML solution 100 mL (100 mLs Intravenous Contrast Given 03/26/21 0009)     ED  COURSE: UA negative for UTI.  No significant electrolyte derangements, mild leukocytosis, no anemia, normal LFTs and lipase.  CT showing stable unchanged right peri-incisional ventral hernia containing loop of small bowel with no signs of strangulation or SBO.  I did review patient's prior CTs from August, March, and February 2022 and the hernia is unchanged. Patient does  have a surgeon who follows her for the hernia.  Admission was considered but felt unnecessary with unchanged imaging, symptoms well controlled after IV medications, no signs of incarceration or SBO.  Recommended close follow-up with her surgeon and discussed my standard return precautions.   Consults: None   EMR reviewed including last visit with PCP from 4 days ago for bacterial sinusitis    FINAL CLINICAL IMPRESSION(S) / ED DIAGNOSES   Final diagnoses:  Ventral hernia without obstruction or gangrene     Rx / DC Orders   ED Discharge Orders     None        Note:  This document was prepared using Dragon voice recognition software and may include unintentional dictation errors.   Please note:  Patient was evaluated in Emergency Department today for the symptoms described in the history of present illness. Patient was evaluated in the context of the global COVID-19 pandemic, which necessitated consideration that the patient might be at risk for infection with the SARS-CoV-2 virus that causes COVID-19. Institutional protocols and algorithms that pertain to the evaluation of patients at risk for COVID-19 are in a state of rapid change based on information released by regulatory bodies including the CDC and federal and state organizations. These policies and algorithms were followed during the patient's care in the ED.  Some ED evaluations and interventions may be delayed as a result of limited staffing during the pandemic.       Alfred Levins, [place], MD 03/26/21 [phone]

## 2021-06-30 ENCOUNTER — Other Ambulatory Visit: Payer: Self-pay | Admitting: Surgery

## 2021-06-30 ENCOUNTER — Other Ambulatory Visit (HOSPITAL_COMMUNITY): Payer: Self-pay | Admitting: Surgery

## 2021-06-30 DIAGNOSIS — R109 Unspecified abdominal pain: Secondary | ICD-10-CM

## 2021-06-30 DIAGNOSIS — Z8719 Personal history of other diseases of the digestive system: Secondary | ICD-10-CM

## 2021-07-04 ENCOUNTER — Emergency Department: Payer: Medicare (Managed Care)

## 2021-07-04 ENCOUNTER — Other Ambulatory Visit: Payer: Self-pay

## 2021-07-04 ENCOUNTER — Emergency Department
Admission: EM | Admit: 2021-07-04 | Discharge: 2021-07-04 | Disposition: A | Discharge status: Home / Self Care | Payer: Medicare (Managed Care) | Attending: Emergency Medicine | Admitting: Emergency Medicine

## 2021-07-04 DIAGNOSIS — R1084 Generalized abdominal pain: Secondary | ICD-10-CM | POA: Diagnosis not present

## 2021-07-04 DIAGNOSIS — R0602 Shortness of breath: Secondary | ICD-10-CM | POA: Diagnosis present

## 2021-07-04 DIAGNOSIS — N189 Chronic kidney disease, unspecified: Secondary | ICD-10-CM | POA: Insufficient documentation

## 2021-07-04 DIAGNOSIS — I130 Hypertensive heart and chronic kidney disease with heart failure and stage 1 through stage 4 chronic kidney disease, or unspecified chronic kidney disease: Secondary | ICD-10-CM | POA: Insufficient documentation

## 2021-07-04 DIAGNOSIS — I509 Heart failure, unspecified: Secondary | ICD-10-CM | POA: Insufficient documentation

## 2021-07-04 DIAGNOSIS — R059 Cough, unspecified: Secondary | ICD-10-CM | POA: Insufficient documentation

## 2021-07-04 DIAGNOSIS — Z20822 Contact with and (suspected) exposure to covid-19: Secondary | ICD-10-CM | POA: Diagnosis not present

## 2021-07-04 DIAGNOSIS — R42 Dizziness and giddiness: Secondary | ICD-10-CM | POA: Diagnosis not present

## 2021-07-04 DIAGNOSIS — J449 Chronic obstructive pulmonary disease, unspecified: Secondary | ICD-10-CM | POA: Insufficient documentation

## 2021-07-04 DIAGNOSIS — E1122 Type 2 diabetes mellitus with diabetic chronic kidney disease: Secondary | ICD-10-CM | POA: Diagnosis not present

## 2021-07-04 DIAGNOSIS — E1165 Type 2 diabetes mellitus with hyperglycemia: Secondary | ICD-10-CM | POA: Insufficient documentation

## 2021-07-04 DIAGNOSIS — R0981 Nasal congestion: Secondary | ICD-10-CM | POA: Diagnosis not present

## 2021-07-04 LAB — URINALYSIS, ROUTINE W REFLEX MICROSCOPIC
Bilirubin Urine: NEGATIVE
Glucose, UA: >=500 mg/dL — AB
Hgb urine dipstick: NEGATIVE
Ketones, ur: NEGATIVE mg/dL
Leukocytes,Ua: NEGATIVE
Nitrite: NEGATIVE
Protein, ur: NEGATIVE mg/dL
Specific Gravity, Urine: 1.012 (ref 1.005–1.030)
pH: 5 (ref 5.0–8.0)

## 2021-07-04 LAB — HEPATIC FUNCTION PANEL
ALT: 16 U/L (ref 0–44)
AST: 18 U/L (ref 15–41)
Albumin: 4.4 g/dL (ref 3.5–5.0)
Alkaline Phosphatase: 113 U/L (ref 38–126)
Bilirubin, Direct: <0.1 mg/dL (ref 0.0–0.2)
Total Bilirubin: 0.4 mg/dL (ref 0.3–1.2)
Total Protein: 7.8 g/dL (ref 6.5–8.1)

## 2021-07-04 LAB — RESP PANEL BY RT-PCR (FLU A&B, COVID) ARPGX2
Influenza A by PCR: NEGATIVE
Influenza B by PCR: NEGATIVE
SARS Coronavirus 2 by RT PCR: NEGATIVE

## 2021-07-04 LAB — CBC
HCT: 37.4 % (ref 36.0–46.0)
Hemoglobin: 11.4 g/dL — ABNORMAL LOW (ref 12.0–15.0)
MCH: 27 pg (ref 26.0–34.0)
MCHC: 30.5 g/dL (ref 30.0–36.0)
MCV: 88.4 fL (ref 80.0–100.0)
Platelets: 335 10*3/uL (ref 150–400)
RBC: 4.23 MIL/uL (ref 3.87–5.11)
RDW: 17.5 % — ABNORMAL HIGH (ref 11.5–15.5)
WBC: 10.1 10*3/uL (ref 4.0–10.5)
nRBC: 0 % (ref 0.0–0.2)

## 2021-07-04 LAB — TROPONIN I (HIGH SENSITIVITY)
Troponin I (High Sensitivity): 3 ng/L (ref <18)
Troponin I (High Sensitivity): 3 ng/L (ref <18)

## 2021-07-04 LAB — BASIC METABOLIC PANEL
Anion gap: 12 (ref 5–15)
BUN: 31 mg/dL — ABNORMAL HIGH (ref 6–20)
CO2: 24 mmol/L (ref 22–32)
Calcium: 9.2 mg/dL (ref 8.9–10.3)
Chloride: 104 mmol/L (ref 98–111)
Creatinine, Ser: 0.96 mg/dL (ref 0.44–1.00)
GFR, Estimated: >60 mL/min (ref >60)
Glucose, Bld: 183 mg/dL — ABNORMAL HIGH (ref 70–99)
Potassium: 4 mmol/L (ref 3.5–5.1)
Sodium: 140 mmol/L (ref 135–145)

## 2021-07-04 LAB — CBG MONITORING, ED: Glucose-Capillary: 134 mg/dL — ABNORMAL HIGH (ref 70–99)

## 2021-07-04 LAB — BRAIN NATRIURETIC PEPTIDE: B Natriuretic Peptide: 39.4 pg/mL (ref 0.0–100.0)

## 2021-07-04 LAB — LIPASE, BLOOD: Lipase: 40 U/L (ref 11–51)

## 2021-07-04 MED ORDER — SODIUM CHLORIDE 0.9 % IV BOLUS
1000.0000 mL | Freq: Once | INTRAVENOUS | Status: AC
  Administered 2021-07-04: 1000 mL via INTRAVENOUS

## 2021-07-04 MED ORDER — PREDNISONE 50 MG PO TABS: 50.0000 mg | ORAL_TABLET | Freq: Every day | ORAL | 0 refills | Status: AC

## 2021-07-04 MED ORDER — AMOXICILLIN 400 MG/5ML PO SUSR: 400.0000 mg | Freq: Three times a day (TID) | ORAL | 0 refills | Status: AC

## 2021-07-04 MED ORDER — IOHEXOL 300 MG/ML  SOLN
100.0000 mL | Freq: Once | INTRAMUSCULAR | Status: AC | PRN
  Administered 2021-07-04: 100 mL via INTRAVENOUS

## 2021-07-04 MED ORDER — HYDROCODONE-ACETAMINOPHEN 5-325 MG PO TABS
1.0000 | ORAL_TABLET | Freq: Once | ORAL | Status: AC
  Administered 2021-07-04: 1 via ORAL
  Filled 2021-07-04: qty 1

## 2021-07-04 MED ORDER — ONDANSETRON HCL 4 MG/2ML IJ SOLN
4.0000 mg | Freq: Once | INTRAMUSCULAR | Status: AC
  Administered 2021-07-04: 4 mg via INTRAVENOUS
  Filled 2021-07-04: qty 2

## 2021-07-04 NOTE — ED Notes (Addendum)
Pt assisted to toilet min assist. [place] removed at this time. Pt denying SHOB.

## 2021-07-04 NOTE — ED Provider Notes (Signed)
[facility] Provider Note    Event Date/Time   First MD Initiated Contact with Patient 07/04/21 1254     (approximate)   History   Chief Complaint Shortness of Breath   HPI Marie Clarke is a 51 y.o. female, history of CKD, hypertension, type 2 diabetes, chronic musculoskeletal pain, GERD, bipolar affective disorder, CHF, COPD, morbid obesity, anxiety, seizures, presents to the emergency department for evaluation of shortness of breath.  Patient reports cough and congestion for the past few days.  When she was at [employer] earlier today, she felt short of breath and lightheaded/dizzy.  EMS was called, where initial evaluation revealed oxygen saturation 88% on room air.  Denies fever/chills, chest pain, abdominal pain, flank pain, nausea/vomiting, diarrhea, urinary symptoms, headache, rash/lesions, or headache.  Patient additionally endorses some generalized abdominal pain, predominantly on the right side that she says is worsened over the past few days.  Reports recent surgery for hernia 2 months prior.  She has a abdominal CT scan scheduled for 07/07/2021.  History Limitations: No limitations        Physical Exam  Triage Vital Signs: ED Triage Vitals [07/04/21 1123]  Enc Vitals Group     BP 113/78     Pulse Rate 82     Resp 16     Temp 98.3 F (36.8 C)     Temp Source Oral     SpO2 99 %     Weight 235 lb 0.2 oz (106.6 kg)     Height '5\' 3"'$  (1.6 m)     Head Circumference      Peak Flow      Pain Score 9     Pain Loc      Pain Edu?      Excl. in [place]?     Most recent vital signs: Vitals:   07/04/21 1600 07/04/21 1630  BP: 105/86 111/78  Pulse: 97 (!) 103  Resp: 19 13  Temp:    SpO2: 100% 99%    General: Awake, NAD.  Skin: Warm, dry. No rashes or lesions.  Eyes: PERRL. Conjunctivae normal.  CV: Good peripheral perfusion.  Resp: Normal effort.  Mild rhonchi appreciated in the lower lobes bilaterally. Abd: Soft, no distention.  No hernias  palpated.  Mild-moderate tenderness on the right side of the patient's abdomen, between upper and lower quadrants. Neuro: At baseline. No gross neurological deficits.   Focused Exam: N/A.  Physical Exam    ED Results / Procedures / Treatments  Labs (all labs ordered are listed, but only abnormal results are displayed) Labs Reviewed  CBC - Abnormal; Notable for the following components:      Result Value   Hemoglobin 11.4 (*)    RDW 17.5 (*)    All other components within normal limits  BASIC METABOLIC PANEL - Abnormal; Notable for the following components:   Glucose, Bld 183 (*)    BUN 31 (*)    All other components within normal limits  URINALYSIS, ROUTINE W REFLEX MICROSCOPIC - Abnormal; Notable for the following components:   Color, Urine YELLOW (*)    APPearance CLEAR (*)    Glucose, UA >=500 (*)    Bacteria, UA RARE (*)    All other components within normal limits  CBG MONITORING, ED - Abnormal; Notable for the following components:   Glucose-Capillary 134 (*)    All other components within normal limits  RESP PANEL BY RT-PCR (FLU A&B, COVID) ARPGX2  LIPASE, BLOOD  BRAIN NATRIURETIC PEPTIDE  HEPATIC FUNCTION PANEL  TROPONIN I (HIGH SENSITIVITY)  TROPONIN I (HIGH SENSITIVITY)     EKG Sinus rhythm, rate of 80, no T-segment changes, no axis deviations, no AV blocks, normal QRS interval   RADIOLOGY  ED Provider Interpretation: I personally viewed and interpreted these images.  Chest x-ray shows evidence suggestive of bronchitis versus interstitial pneumonia.  CT abdomen does not show any acute findings.  DG Chest 2 View  Result Date: 07/04/2021 CLINICAL DATA:  Shortness of breath EXAM: CHEST - 2 VIEW COMPARISON:  Previous studies including the examination of 03/18/2021 FINDINGS: Cardiac size is within normal limits. There is peribronchial thickening. There is poor inspiration. Interstitial markings in the parahilar regions and lower lung fields are prominent. There  is no focal consolidation. There is no pleural effusion or pneumothorax. IMPRESSION: Bronchitis. Prominence of interstitial markings in the parahilar regions and lower lung fields may suggest interstitial pneumonia. There is no focal pulmonary consolidation. There is no pleural effusion. Electronically Signed   By: Elmer Picker M.D.   On: 07/04/2021 12:17   CT Abdomen Pelvis W Contrast  Result Date: 07/04/2021 CLINICAL DATA:  Abdominal pain postop.  Dizziness EXAM: CT ABDOMEN AND PELVIS WITH CONTRAST TECHNIQUE: Multidetector CT imaging of the abdomen and pelvis was performed using the standard protocol following bolus administration of intravenous contrast. RADIATION DOSE REDUCTION: This exam was performed according to the departmental dose-optimization program which includes automated exposure control, adjustment of the mA and/or kV according to patient size and/or use of iterative reconstruction technique. CONTRAST:  162m OMNIPAQUE IOHEXOL 300 MG/ML  SOLN COMPARISON:  None Available. FINDINGS: Lower chest: Lung bases are clear. Hepatobiliary: No focal hepatic lesion. No biliary duct dilatation. Common bile duct is normal. Pancreas: Pancreas is normal. No ductal dilatation. No pancreatic inflammation. Spleen: Normal spleen Adrenals/urinary tract: Nodule of the lateral limb LEFT adrenal gland measures 19 mm x 19 mm. The lesion demonstrates 42% washout from portal venous phase to delayed phase consistent with benign adenoma. RIGHT adrenal gland normal. No renal mass lesion. No ureteral obstruction. Bladder normal. Stomach/Bowel: Stomach, duodenum and small bowel normal. Appendix normal. Ascending transverse descending colon normal. Anastomosis in the mid sigmoid colon without complication Vascular/Lymphatic: Abdominal aorta is normal caliber. No periportal or retroperitoneal adenopathy. No pelvic adenopathy. Reproductive: Post hysterectomy.  Adnexa unremarkable Other: There is a midline ventral wound  which appears well-healed. There is subcutaneous thickening midline superficial to the rectus muscles (image 74/2). There is no clear fluid collection associated with this subcutaneous thickening. Musculoskeletal: No aggressive osseous lesion. IMPRESSION: 1. Subcutaneous thickening along the surgical wound midline superficial to the rectus muscle. No organized collections. Findings appear to represent benign sterile postsurgical change. 2. Sigmoid colon anastomosis without complication. 3. Benign adenoma of the LEFT renal. Electronically Signed   By: SSuzy BouchardM.D.   On: 07/04/2021 14:37    PROCEDURES:  Critical Care performed: None.  Procedures    MEDICATIONS ORDERED IN ED: Medications  sodium chloride 0.9 % bolus 1,000 mL (0 mLs Intravenous Stopped 07/04/21 1700)  HYDROcodone-acetaminophen (NORCO/VICODIN) 5-325 MG per tablet 1 tablet (1 tablet Oral Given 07/04/21 1344)  iohexol (OMNIPAQUE) 300 MG/ML solution 100 mL (100 mLs Intravenous Contrast Given 07/04/21 1405)  ondansetron (ZOFRAN) injection 4 mg (4 mg Intravenous Given 07/04/21 1520)     IMPRESSION / MDM / ASSESSMENT AND PLAN / ED COURSE  I reviewed the triage vital signs and the nursing notes.  Differential diagnosis includes, but is not limited to, COPD exacerbation, bronchitis, pneumonia, CHF exacerbation, hernia, diverticulitis,   ED Course Patient appears well, vitals within normal limits.  NAD.  Currently 100% on 3 L nasal cannula.  At rest, patient's SPO2 is 97%.  Ambulatory SPO2 is 95% on room air.  CBC shows no leukocytosis or clinically significant anemia.  BMP shows hyperglycemia at 183, consistent with her history, otherwise no evidence of electrolyte abnormalities or kidney injury.  Urinalysis notable for hyperglycemia, otherwise no evidence of infection.  Lipase unremarkable at 40.  Unlikely pancreatitis.  BMP unremarkable at 39.4, unlikely CHF  exacerbation.   Assessment/Plan Patient presents with shortness of breath.  X-ray shows findings suggestive of bronchitis versus interstitial pneumonia.  She clinically appears well.  Ambulatory SPO2 is 95% on room air.  Work-up has been unremarkable.  Unlikely CHF given low BNP.  We will provide her with amoxicillin and short course of prednisone.  Advised her to follow-up with her primary care provider to ensure improvement in symptoms.  In regards to her abdominal pain, her CT scan did not show any significant acute findings.  Incidental findings were communicated with the patient, advised her to follow-up with her primary care provider for follow-up of these findings.  No significant pain at this time.  We will plan to discharge this patient.  Considered admission for this patient, but given her stable presentation, unremarkable work-up, she is unlikely to benefit.  Provided the patient with anticipatory guidance, return precautions, and educational material. Encouraged the patient to return to the emergency department at any time if they begin to experience any new or worsening symptoms. Patient expressed understanding and agreed with the plan.       FINAL CLINICAL IMPRESSION(S) / ED DIAGNOSES   Final diagnoses:  Shortness of breath     Rx / DC Orders   ED Discharge Orders          Ordered    amoxicillin (AMOXIL) 400 MG/5ML suspension  3 times daily        07/04/21 1638    predniSONE (DELTASONE) 50 MG tablet  Daily with breakfast        07/04/21 1638             Note:  This document was prepared using Dragon voice recognition software and may include unintentional dictation errors.   Teodoro Spray, [place] 07/04/21 Darci Current    Duffy Bruce, MD 07/15/21 [phone]

## 2021-07-04 NOTE — ED Notes (Addendum)
PT endorsing nausea and abdominal pain at this time. PT sitting comfortably in bed,asking for a phone charger.

## 2021-07-04 NOTE — ED Notes (Signed)
Ambulatory walking trial, pt walked 100 yards with spo2 staying at or above 99% on RA

## 2021-07-04 NOTE — Discharge Instructions (Addendum)
-  Take all of antibiotics as prescribed.  Take all the prednisone as prescribed.  -Follow-up with your primary care provider as discussed.  -Return to the emergency department anytime if you begin to experience any new or worsening symptoms.

## 2021-07-04 NOTE — ED Notes (Signed)
Pt asking for zofran, pt playing on Ipad at this time.

## 2021-07-04 NOTE — ED Triage Notes (Signed)
Pt here with SOB that started today at [employer]. Pt also states she felt lightheaded and dizzy. Pt oxygen sat was 88% on RA on ems arrival, sats increased to 96% on 2L BNC. Pt has a hx of DM, CHF, and COPD.   200-cbg

## 2021-07-04 NOTE — ED Notes (Signed)
Messaged PA Erlene Quan about pt questions. Pt asking for gingerale, update on imaging and zofran at this time

## 2021-07-04 NOTE — ED Notes (Signed)
Dc ppw provided. RX information and follow up reviewed. Pt provides verbal consent for dc at this time. Pt to lobby alert and oriented via wheelchair.

## 2021-07-07 ENCOUNTER — Ambulatory Visit: Payer: Medicare (Managed Care)

## 2021-08-03 ENCOUNTER — Encounter: Payer: Self-pay | Admitting: Orthopedic Surgery

## 2021-08-03 ENCOUNTER — Other Ambulatory Visit: Payer: Self-pay | Admitting: Orthopedic Surgery

## 2021-08-03 DIAGNOSIS — Z01818 Encounter for other preprocedural examination: Secondary | ICD-10-CM

## 2021-08-03 NOTE — H&P (Cosign Needed)
NAME: Marie Clarke MRN:   [id number] DOB:   09/23/1970     HISTORY AND PHYSICAL  CHIEF COMPLAINT:  left hip pain  HISTORY:   Marie Clarke a 51 y.o. female  with left  Hip Pain Patient complains of left hip pain. Onset of the symptoms was several years ago. Inciting event: known DJD. The patient reports the hip pain is worse with weight bearing. Associated symptoms: none. Aggravating symptoms include: any weight bearing. Patient has had prior hip problems. Previous visits for this problem: yes, last seen several weeks ago by me. Evaluation to date: plain films, which were abnormal  osteoarthritis . Treatment to date: OTC analgesics, which have been somewhat effective, prescription analgesics, which have been somewhat effective, and home exercise program, which has been somewhat effective.    Plan for left total hip replacement  PAST MEDICAL HISTORY:   Past Medical History:  Diagnosis Date   Anemia    Anginal pain (HCC)    Anxiety    Arthritis    Asthma    Bilateral lower extremity edema    Bipolar disorder (HCC)    CAD (coronary artery disease) unk   CHF (congestive heart failure) (HCC)    Chronic pain syndrome    COPD (chronic obstructive pulmonary disease) ([place])    emphysema   DDD (degenerative disc disease), lumbar    Depression unk   Diabetes mellitus without complication ([place])    Diabetes mellitus, type II ([place])    Drug overdose 05-24-13   after dad died   Dysrhythmia    history of tachy arrythmias   Fibromyalgia    GERD (gastroesophageal reflux disease)    Headache    chronic migraines   Hyperlipidemia    Hypertension    Hypothyroidism    Left leg pain 04/29/2014   MI (myocardial infarction) ([place]) May 24, 2005   Muscle ache 09/16/2014   Osteoporosis    Overactive bladder    Pancreatitis unk   Panic attacks    PTSD (post-traumatic stress disorder)    Reflex sympathetic dystrophy    Renal cyst, left    Renal insufficiency    ckd stage iii   Restless legs syndrome    Sleep  apnea 06/2019   uses cpap   Sleep apnea    Stroke ([place]) 24-May-2008   TIA x 2. no residual deficits   [facility]    after father died.   Thyroid disease    thyroid nodule   TIA (transient ischemic attack) unk   TIA (transient ischemic attack)     PAST SURGICAL HISTORY:   Past Surgical History:  Procedure Laterality Date   ABDOMINAL HYSTERECTOMY     CARDIAC CATHETERIZATION     HERNIA REPAIR  07/15/2017   [facility]   lumbar facet     several   MELANOMA EXCISION  03/2020   OUTSIDE OF RECTUM   prolapse rectum surgery N/A 08/2014   SPINAL CORD STIMULATOR REMOVAL N/A 11/07/2020   Procedure: REMOVAL OF THORACIC SPINAL CORD STIMULATOR & PULSE GENERATOR;  Surgeon: [name], MD;  Location: [facility] ORS;  Service: Neurosurgery;  Laterality: N/A;   THORACIC LAMINECTOMY FOR SPINAL CORD STIMULATOR N/A 07/27/2019   Procedure: THORACIC SPINAL CORD STIMULATOR PLACEMENT WITH RIGHT FLANK PULSE GENERATOR;  Surgeon: [name], MD;  Location: [facility] ORS;  Service: Neurosurgery;  Laterality: N/A;   TONSILLECTOMY      MEDICATIONS:  (Not in a hospital admission)   ALLERGIES:   Allergies  Allergen Reactions   Diazepam Hives  and Nausea And Vomiting   Ziprasidone Hcl Itching and Other (See Comments)    Confusion    Azithromycin Hives   Divalproex Sodium Hives   Levofloxacin Hives and Rash    1/31: would like to retry since not taking valium   Lisinopril Other (See Comments)    Medicine cause kidney failure   Metronidazole Hives    1/31: would like to retry since she is no longer taking valium   Sulfa Antibiotics Hives   Valproic Acid Itching and Rash   Cephalexin Hives   Ciprofloxacin Hives   Doxycycline Rash   Penicillins Hives    Has patient had a PCN reaction causing immediate rash, facial/tongue/throat swelling, SOB or lightheadedness with hypotension: No Has patient had a PCN reaction causing severe rash involving mucus membranes or skin necrosis: No Has patient had a PCN reaction  that required hospitalization: No Has patient had a PCN reaction occurring within the last 10 years: No If all of the above answers are "NO", then may proceed with Cephalosporin use.     REVIEW OF SYSTEMS:   Negative except HPI  FAMILY HISTORY:   Family History  Problem Relation Age of Onset   Diabetes Mellitus II Mother    CAD Mother    Sleep apnea Mother    Osteoarthritis Mother    Osteoporosis Mother    Anxiety disorder Mother    Depression Mother    Bipolar disorder Mother    Bipolar disorder Father    Hypertension Father    Depression Father    Anxiety disorder Father    Post-traumatic stress disorder Sister     SOCIAL HISTORY:   reports that she quit smoking about 2 years ago. Her smoking use included cigarettes. She smoked an average of .5 packs per day. She has never used smokeless tobacco. She reports that she does not drink alcohol and does not use drugs.  PHYSICAL EXAM:  General appearance: alert, cooperative, and no distress Resp: clear to auscultation bilaterally GI: soft, non-tender; bowel sounds normal; no masses,  no organomegaly Extremities: extremities normal, atraumatic, no cyanosis or edema and Homans sign is negative, no sign of DVT Pulses: 2+ and symmetric Skin: Skin color, texture, turgor normal. No rashes or lesions Neurologic: Alert and oriented X 3, normal strength and tone. Normal symmetric reflexes. Normal coordination and gait    LABORATORY STUDIES: No results for input(s): "WBC", "HGB", "HCT", "PLT" in the last 72 hours.  No results for input(s): "NA", "K", "CL", "CO2", "GLUCOSE", "BUN", "CREATININE", "CALCIUM" in the last 72 hours.  STUDIES/RESULTS:  CT Abdomen Pelvis W Contrast  Result Date: 07/04/2021 CLINICAL DATA:  Abdominal pain postop.  Dizziness EXAM: CT ABDOMEN AND PELVIS WITH CONTRAST TECHNIQUE: Multidetector CT imaging of the abdomen and pelvis was performed using the standard protocol following bolus administration of intravenous  contrast. RADIATION DOSE REDUCTION: This exam was performed according to the departmental dose-optimization program which includes automated exposure control, adjustment of the mA and/or kV according to patient size and/or use of iterative reconstruction technique. CONTRAST:  16m OMNIPAQUE IOHEXOL 300 MG/ML  SOLN COMPARISON:  None Available. FINDINGS: Lower chest: Lung bases are clear. Hepatobiliary: No focal hepatic lesion. No biliary duct dilatation. Common bile duct is normal. Pancreas: Pancreas is normal. No ductal dilatation. No pancreatic inflammation. Spleen: Normal spleen Adrenals/urinary tract: Nodule of the lateral limb LEFT adrenal gland measures 19 mm x 19 mm. The lesion demonstrates 42% washout from portal venous phase to delayed phase consistent with benign adenoma.  RIGHT adrenal gland normal. No renal mass lesion. No ureteral obstruction. Bladder normal. Stomach/Bowel: Stomach, duodenum and small bowel normal. Appendix normal. Ascending transverse descending colon normal. Anastomosis in the mid sigmoid colon without complication Vascular/Lymphatic: Abdominal aorta is normal caliber. No periportal or retroperitoneal adenopathy. No pelvic adenopathy. Reproductive: Post hysterectomy.  Adnexa unremarkable Other: There is a midline ventral wound which appears well-healed. There is subcutaneous thickening midline superficial to the rectus muscles (image 74/2). There is no clear fluid collection associated with this subcutaneous thickening. Musculoskeletal: No aggressive osseous lesion. IMPRESSION: 1. Subcutaneous thickening along the surgical wound midline superficial to the rectus muscle. No organized collections. Findings appear to represent benign sterile postsurgical change. 2. Sigmoid colon anastomosis without complication. 3. Benign adenoma of the LEFT renal. Electronically Signed   By: Suzy Bouchard M.D.   On: 07/04/2021 14:37   DG Chest 2 View  Result Date: 07/04/2021 CLINICAL DATA:   Shortness of breath EXAM: CHEST - 2 VIEW COMPARISON:  Previous studies including the examination of 03/18/2021 FINDINGS: Cardiac size is within normal limits. There is peribronchial thickening. There is poor inspiration. Interstitial markings in the parahilar regions and lower lung fields are prominent. There is no focal consolidation. There is no pleural effusion or pneumothorax. IMPRESSION: Bronchitis. Prominence of interstitial markings in the parahilar regions and lower lung fields may suggest interstitial pneumonia. There is no focal pulmonary consolidation. There is no pleural effusion. Electronically Signed   By: Elmer Picker M.D.   On: 07/04/2021 12:17    ASSESSMENT:  End stage osteoarthritis left hip        Active Problems:   * No active hospital problems. *    PLAN:  Left Primary Total Hip   Carlynn Spry 08/03/2021. 6:56 AM

## 2021-08-04 ENCOUNTER — Encounter
Admission: RE | Admit: 2021-08-04 | Discharge: 2021-08-04 | Disposition: A | Discharge status: Home / Self Care | Payer: Medicare (Managed Care) | Source: Ambulatory Visit | Attending: Orthopedic Surgery | Admitting: Orthopedic Surgery

## 2021-08-04 VITALS — BP 110/72 | HR 98 | Temp 98.0°F | Resp 16 | Ht 64.0 in | Wt 209.0 lb

## 2021-08-04 DIAGNOSIS — Z01812 Encounter for preprocedural laboratory examination: Secondary | ICD-10-CM | POA: Diagnosis present

## 2021-08-04 DIAGNOSIS — Z01818 Encounter for other preprocedural examination: Secondary | ICD-10-CM

## 2021-08-04 HISTORY — DX: Chronic kidney disease, stage 3a: N18.31

## 2021-08-04 HISTORY — DX: Family history of other specified conditions: Z84.89

## 2021-08-04 LAB — URINALYSIS, ROUTINE W REFLEX MICROSCOPIC
Bilirubin Urine: NEGATIVE
Glucose, UA: >=500 mg/dL — AB
Hgb urine dipstick: NEGATIVE
Ketones, ur: NEGATIVE mg/dL
Leukocytes,Ua: NEGATIVE
Nitrite: NEGATIVE
Protein, ur: NEGATIVE mg/dL
Specific Gravity, Urine: 1.006 (ref 1.005–1.030)
pH: 7 (ref 5.0–8.0)

## 2021-08-04 LAB — BASIC METABOLIC PANEL
Anion gap: 8 (ref 5–15)
BUN: 18 mg/dL (ref 6–20)
CO2: 29 mmol/L (ref 22–32)
Calcium: 9.1 mg/dL (ref 8.9–10.3)
Chloride: 100 mmol/L (ref 98–111)
Creatinine, Ser: 0.79 mg/dL (ref 0.44–1.00)
GFR, Estimated: >60 mL/min (ref >60)
Glucose, Bld: 191 mg/dL — ABNORMAL HIGH (ref 70–99)
Potassium: 3.4 mmol/L — ABNORMAL LOW (ref 3.5–5.1)
Sodium: 137 mmol/L (ref 135–145)

## 2021-08-04 LAB — CBC
HCT: 33.8 % — ABNORMAL LOW (ref 36.0–46.0)
Hemoglobin: 10.7 g/dL — ABNORMAL LOW (ref 12.0–15.0)
MCH: 27.4 pg (ref 26.0–34.0)
MCHC: 31.7 g/dL (ref 30.0–36.0)
MCV: 86.4 fL (ref 80.0–100.0)
Platelets: 268 10*3/uL (ref 150–400)
RBC: 3.91 MIL/uL (ref 3.87–5.11)
RDW: 17 % — ABNORMAL HIGH (ref 11.5–15.5)
WBC: 5 10*3/uL (ref 4.0–10.5)
nRBC: 0 % (ref 0.0–0.2)

## 2021-08-04 LAB — SURGICAL PCR SCREEN
MRSA, PCR: NEGATIVE
Staphylococcus aureus: POSITIVE — AB

## 2021-08-06 MED ORDER — SODIUM CHLORIDE 0.9 % IV SOLN: INTRAVENOUS | Status: DC

## 2021-08-06 MED ORDER — ORAL CARE MOUTH RINSE: 15.0000 mL | Freq: Once | OROMUCOSAL | Status: AC

## 2021-08-06 MED ORDER — TRANEXAMIC ACID-NACL 1000-0.7 MG/100ML-% IV SOLN
1000.0000 mg | INTRAVENOUS | Status: AC
  Administered 2021-08-07: 1000 mg via INTRAVENOUS

## 2021-08-06 MED ORDER — CEFAZOLIN SODIUM-DEXTROSE 2-4 GM/100ML-% IV SOLN
2.0000 g | INTRAVENOUS | Status: AC
  Administered 2021-08-07: 2 g via INTRAVENOUS

## 2021-08-06 MED ORDER — POVIDONE-IODINE 10 % EX SWAB: 2.0000 | Freq: Once | CUTANEOUS | Status: DC

## 2021-08-06 MED ORDER — CHLORHEXIDINE GLUCONATE 0.12 % MT SOLN: 15.0000 mL | Freq: Once | OROMUCOSAL | Status: AC

## 2021-08-06 MED ORDER — LACTATED RINGERS IV SOLN: INTRAVENOUS | Status: DC

## 2021-08-07 ENCOUNTER — Ambulatory Visit: Payer: Medicare (Managed Care) | Admitting: Urgent Care

## 2021-08-07 ENCOUNTER — Other Ambulatory Visit: Payer: Self-pay

## 2021-08-07 ENCOUNTER — Observation Stay
Admission: RE | Admit: 2021-08-07 | Discharge: 2021-08-09 | Disposition: A | Discharge status: Home Health | Payer: Medicare (Managed Care) | Attending: Orthopedic Surgery | Admitting: Orthopedic Surgery

## 2021-08-07 ENCOUNTER — Encounter: Payer: Self-pay | Admitting: Orthopedic Surgery

## 2021-08-07 ENCOUNTER — Ambulatory Visit: Payer: Medicare (Managed Care) | Admitting: Certified Registered"

## 2021-08-07 ENCOUNTER — Ambulatory Visit: Payer: Medicare (Managed Care)

## 2021-08-07 ENCOUNTER — Encounter
Admission: RE | Disposition: A | Discharge status: Home Health | Payer: Self-pay | Source: Home / Self Care | Attending: Orthopedic Surgery

## 2021-08-07 DIAGNOSIS — Z96642 Presence of left artificial hip joint: Principal | ICD-10-CM

## 2021-08-07 DIAGNOSIS — M1612 Unilateral primary osteoarthritis, left hip: Secondary | ICD-10-CM | POA: Diagnosis present

## 2021-08-07 DIAGNOSIS — J449 Chronic obstructive pulmonary disease, unspecified: Secondary | ICD-10-CM | POA: Insufficient documentation

## 2021-08-07 DIAGNOSIS — E1122 Type 2 diabetes mellitus with diabetic chronic kidney disease: Secondary | ICD-10-CM | POA: Diagnosis not present

## 2021-08-07 DIAGNOSIS — N1831 Chronic kidney disease, stage 3a: Secondary | ICD-10-CM | POA: Diagnosis not present

## 2021-08-07 DIAGNOSIS — Z01812 Encounter for preprocedural laboratory examination: Secondary | ICD-10-CM

## 2021-08-07 DIAGNOSIS — I2510 Atherosclerotic heart disease of native coronary artery without angina pectoris: Secondary | ICD-10-CM | POA: Insufficient documentation

## 2021-08-07 DIAGNOSIS — J45909 Unspecified asthma, uncomplicated: Secondary | ICD-10-CM | POA: Insufficient documentation

## 2021-08-07 DIAGNOSIS — E039 Hypothyroidism, unspecified: Secondary | ICD-10-CM | POA: Insufficient documentation

## 2021-08-07 DIAGNOSIS — Z8673 Personal history of transient ischemic attack (TIA), and cerebral infarction without residual deficits: Secondary | ICD-10-CM | POA: Insufficient documentation

## 2021-08-07 DIAGNOSIS — I130 Hypertensive heart and chronic kidney disease with heart failure and stage 1 through stage 4 chronic kidney disease, or unspecified chronic kidney disease: Secondary | ICD-10-CM | POA: Insufficient documentation

## 2021-08-07 DIAGNOSIS — I252 Old myocardial infarction: Secondary | ICD-10-CM | POA: Insufficient documentation

## 2021-08-07 DIAGNOSIS — I509 Heart failure, unspecified: Secondary | ICD-10-CM | POA: Diagnosis not present

## 2021-08-07 HISTORY — PX: TOTAL HIP ARTHROPLASTY: SHX124

## 2021-08-07 LAB — GLUCOSE, CAPILLARY
Glucose-Capillary: 129 mg/dL — ABNORMAL HIGH (ref 70–99)
Glucose-Capillary: 166 mg/dL — ABNORMAL HIGH (ref 70–99)
Glucose-Capillary: 169 mg/dL — ABNORMAL HIGH (ref 70–99)
Glucose-Capillary: 232 mg/dL — ABNORMAL HIGH (ref 70–99)
Glucose-Capillary: 234 mg/dL — ABNORMAL HIGH (ref 70–99)

## 2021-08-07 LAB — TYPE AND SCREEN
ABO/RH(D): A NEG
Antibody Screen: NEGATIVE

## 2021-08-07 LAB — HEMOGLOBIN A1C
Hgb A1c MFr Bld: 7.2 % — ABNORMAL HIGH (ref 4.8–5.6)
Mean Plasma Glucose: 160 mg/dL

## 2021-08-07 SURGERY — ARTHROPLASTY, HIP, TOTAL, ANTERIOR APPROACH
Anesthesia: Spinal | Site: Hip | Laterality: Left

## 2021-08-07 MED ORDER — MOMETASONE FURO-FORMOTEROL FUM 200-5 MCG/ACT IN AERO
2.0000 | INHALATION_SPRAY | Freq: Two times a day (BID) | RESPIRATORY_TRACT | Status: DC
  Administered 2021-08-08 – 2021-08-09 (×3): 2 via RESPIRATORY_TRACT
  Filled 2021-08-07: qty 8.8

## 2021-08-07 MED ORDER — MENTHOL 3 MG MT LOZG: 1.0000 | LOZENGE | OROMUCOSAL | Status: DC | PRN

## 2021-08-07 MED ORDER — BUPIVACAINE-EPINEPHRINE (PF) 0.25% -1:200000 IJ SOLN
INTRAMUSCULAR | Status: DC | PRN
  Administered 2021-08-07: 30 mL via PERINEURAL

## 2021-08-07 MED ORDER — EMPAGLIFLOZIN 10 MG PO TABS
10.0000 mg | ORAL_TABLET | Freq: Every day | ORAL | Status: DC
  Administered 2021-08-08 – 2021-08-09 (×2): 10 mg via ORAL
  Filled 2021-08-07 (×3): qty 1

## 2021-08-07 MED ORDER — DEXAMETHASONE SODIUM PHOSPHATE 10 MG/ML IJ SOLN
INTRAMUSCULAR | Status: AC
Start: 2021-08-07 — End: ?
  Filled 2021-08-07: qty 1

## 2021-08-07 MED ORDER — PROPOFOL 10 MG/ML IV BOLUS
INTRAVENOUS | Status: AC
  Filled 2021-08-07: qty 40

## 2021-08-07 MED ORDER — MIDAZOLAM HCL 2 MG/2ML IJ SOLN
1.0000 mg | Freq: Once | INTRAMUSCULAR | Status: AC
Start: 2021-08-07 — End: 2021-08-07

## 2021-08-07 MED ORDER — ONDANSETRON HCL 4 MG/2ML IJ SOLN
INTRAMUSCULAR | Status: DC | PRN
  Administered 2021-08-07: 4 mg via INTRAVENOUS

## 2021-08-07 MED ORDER — ASCORBIC ACID 500 MG PO TABS
500.0000 mg | ORAL_TABLET | Freq: Every day | ORAL | Status: DC
Start: 2021-08-07 — End: 2021-08-09
  Administered 2021-08-08 – 2021-08-09 (×2): 500 mg via ORAL
  Filled 2021-08-07 (×2): qty 1

## 2021-08-07 MED ORDER — ONDANSETRON HCL 4 MG/2ML IJ SOLN: 4.0000 mg | Freq: Once | INTRAMUSCULAR | Status: DC | PRN

## 2021-08-07 MED ORDER — FEBUXOSTAT 40 MG PO TABS
40.0000 mg | ORAL_TABLET | Freq: Every day | ORAL | Status: DC
Start: 2021-08-07 — End: 2021-08-09
  Administered 2021-08-08 – 2021-08-09 (×2): 40 mg via ORAL
  Filled 2021-08-07 (×3): qty 1

## 2021-08-07 MED ORDER — LIDOCAINE HCL (PF) 2 % IJ SOLN
INTRAMUSCULAR | Status: AC
  Filled 2021-08-07: qty 5

## 2021-08-07 MED ORDER — BUPIVACAINE-EPINEPHRINE (PF) 0.25% -1:200000 IJ SOLN
INTRAMUSCULAR | Status: AC
  Filled 2021-08-07: qty 30

## 2021-08-07 MED ORDER — LEVOTHYROXINE SODIUM 200 MCG PO TABS
200.0000 ug | ORAL_TABLET | ORAL | Status: DC
  Administered 2021-08-08 – 2021-08-09 (×2): 200 ug via ORAL
  Filled 2021-08-07 (×3): qty 1

## 2021-08-07 MED ORDER — VANCOMYCIN HCL IN DEXTROSE 1-5 GM/200ML-% IV SOLN
1000.0000 mg | Freq: Once | INTRAVENOUS | Status: AC
  Administered 2021-08-07: 1000 mg via INTRAVENOUS
  Filled 2021-08-07: qty 200

## 2021-08-07 MED ORDER — ACETAMINOPHEN 10 MG/ML IV SOLN
INTRAVENOUS | Status: DC | PRN
  Administered 2021-08-07: 1000 mg via INTRAVENOUS

## 2021-08-07 MED ORDER — PHENOL 1.4 % MT LIQD
1.0000 | OROMUCOSAL | Status: DC | PRN
Start: 2021-08-07 — End: 2021-08-09

## 2021-08-07 MED ORDER — METOCLOPRAMIDE HCL 5 MG/ML IJ SOLN: 5.0000 mg | Freq: Three times a day (TID) | INTRAMUSCULAR | Status: DC | PRN

## 2021-08-07 MED ORDER — LEVOTHYROXINE SODIUM 50 MCG PO TABS: 100.0000 ug | ORAL_TABLET | ORAL | Status: DC

## 2021-08-07 MED ORDER — ALBUTEROL SULFATE HFA 108 (90 BASE) MCG/ACT IN AERS: 2.0000 | INHALATION_SPRAY | Freq: Four times a day (QID) | RESPIRATORY_TRACT | Status: DC | PRN

## 2021-08-07 MED ORDER — LACTATED RINGERS IV SOLN: INTRAVENOUS | Status: DC

## 2021-08-07 MED ORDER — MIDAZOLAM HCL 2 MG/2ML IJ SOLN
INTRAMUSCULAR | Status: AC
  Filled 2021-08-07: qty 2

## 2021-08-07 MED ORDER — DOCUSATE SODIUM 100 MG PO CAPS
100.0000 mg | ORAL_CAPSULE | Freq: Two times a day (BID) | ORAL | Status: DC
  Administered 2021-08-07 – 2021-08-09 (×4): 100 mg via ORAL
  Filled 2021-08-07 (×4): qty 1

## 2021-08-07 MED ORDER — PHENYLEPHRINE HCL (PRESSORS) 10 MG/ML IV SOLN
INTRAVENOUS | Status: DC | PRN
  Administered 2021-08-07 (×6): 80 ug via INTRAVENOUS

## 2021-08-07 MED ORDER — ONDANSETRON 4 MG PO TBDP: 4.0000 mg | ORAL_TABLET | Freq: Three times a day (TID) | ORAL | Status: DC | PRN

## 2021-08-07 MED ORDER — HYDROCODONE-ACETAMINOPHEN 7.5-325 MG PO TABS
1.0000 | ORAL_TABLET | ORAL | Status: DC | PRN
  Administered 2021-08-07 – 2021-08-09 (×5): 2 via ORAL
  Filled 2021-08-07 (×2): qty 2
  Filled 2021-08-07: qty 1
  Filled 2021-08-07 (×2): qty 2
  Filled 2021-08-07: qty 1

## 2021-08-07 MED ORDER — PROPOFOL 10 MG/ML IV BOLUS
INTRAVENOUS | Status: AC
  Filled 2021-08-07: qty 20

## 2021-08-07 MED ORDER — GABAPENTIN 600 MG PO TABS
600.0000 mg | ORAL_TABLET | Freq: Three times a day (TID) | ORAL | Status: DC
  Administered 2021-08-07 – 2021-08-09 (×6): 600 mg via ORAL
  Filled 2021-08-07 (×6): qty 1

## 2021-08-07 MED ORDER — PHENYLEPHRINE 80 MCG/ML (10ML) SYRINGE FOR IV PUSH (FOR BLOOD PRESSURE SUPPORT)
PREFILLED_SYRINGE | INTRAVENOUS | Status: AC
  Filled 2021-08-07: qty 10

## 2021-08-07 MED ORDER — METOCLOPRAMIDE HCL 5 MG PO TABS: 5.0000 mg | ORAL_TABLET | Freq: Three times a day (TID) | ORAL | Status: DC | PRN

## 2021-08-07 MED ORDER — FENTANYL CITRATE (PF) 100 MCG/2ML IJ SOLN: 25.0000 ug | INTRAMUSCULAR | Status: DC | PRN

## 2021-08-07 MED ORDER — BISACODYL 10 MG RE SUPP: 10.0000 mg | Freq: Every day | RECTAL | Status: DC | PRN

## 2021-08-07 MED ORDER — DICYCLOMINE HCL 10 MG PO CAPS
10.0000 mg | ORAL_CAPSULE | Freq: Three times a day (TID) | ORAL | Status: DC | PRN
Start: 2021-08-07 — End: 2021-08-09

## 2021-08-07 MED ORDER — ACETAMINOPHEN 325 MG PO TABS: 325.0000 mg | ORAL_TABLET | Freq: Four times a day (QID) | ORAL | Status: DC | PRN

## 2021-08-07 MED ORDER — PANTOPRAZOLE SODIUM 40 MG PO TBEC
80.0000 mg | DELAYED_RELEASE_TABLET | Freq: Every day | ORAL | Status: DC
  Administered 2021-08-07 – 2021-08-08 (×2): 80 mg via ORAL
  Filled 2021-08-07 (×2): qty 2

## 2021-08-07 MED ORDER — HYDROCODONE-ACETAMINOPHEN 5-325 MG PO TABS
1.0000 | ORAL_TABLET | ORAL | Status: DC | PRN
  Administered 2021-08-07 (×2): 2 via ORAL
  Administered 2021-08-08: 1 via ORAL
  Filled 2021-08-07: qty 1
  Filled 2021-08-07 (×2): qty 2

## 2021-08-07 MED ORDER — EPHEDRINE 5 MG/ML INJ
INTRAVENOUS | Status: AC
  Filled 2021-08-07: qty 5

## 2021-08-07 MED ORDER — TRANEXAMIC ACID-NACL 1000-0.7 MG/100ML-% IV SOLN
INTRAVENOUS | Status: AC
  Filled 2021-08-07: qty 100

## 2021-08-07 MED ORDER — CARIPRAZINE HCL 1.5 MG PO CAPS
3.0000 mg | ORAL_CAPSULE | Freq: Every day | ORAL | Status: DC
  Administered 2021-08-07 – 2021-08-08 (×2): 3 mg via ORAL
  Filled 2021-08-07: qty 2
  Filled 2021-08-07: qty 1

## 2021-08-07 MED ORDER — FUROSEMIDE 40 MG PO TABS
80.0000 mg | ORAL_TABLET | Freq: Two times a day (BID) | ORAL | Status: DC
  Administered 2021-08-08 – 2021-08-09 (×3): 80 mg via ORAL
  Filled 2021-08-07 (×4): qty 2

## 2021-08-07 MED ORDER — SODIUM CHLORIDE 0.9 % IR SOLN
Status: DC | PRN
  Administered 2021-08-07: 3000 mL

## 2021-08-07 MED ORDER — ALUM & MAG HYDROXIDE-SIMETH 200-200-20 MG/5ML PO SUSP: 30.0000 mL | ORAL | Status: DC | PRN

## 2021-08-07 MED ORDER — ALBUTEROL SULFATE (2.5 MG/3ML) 0.083% IN NEBU: 2.5000 mg | INHALATION_SOLUTION | Freq: Four times a day (QID) | RESPIRATORY_TRACT | Status: DC | PRN

## 2021-08-07 MED ORDER — ADULT MULTIVITAMIN W/MINERALS CH
1.0000 | ORAL_TABLET | Freq: Every day | ORAL | Status: DC
  Administered 2021-08-08 – 2021-08-09 (×2): 1 via ORAL
  Filled 2021-08-07 (×2): qty 1

## 2021-08-07 MED ORDER — VITAMIN B-12 1000 MCG PO TABS
1000.0000 ug | ORAL_TABLET | Freq: Every day | ORAL | Status: DC
  Administered 2021-08-07 – 2021-08-09 (×3): 1000 ug via ORAL
  Filled 2021-08-07 (×3): qty 1

## 2021-08-07 MED ORDER — LIDOCAINE HCL (CARDIAC) PF 100 MG/5ML IV SOSY
PREFILLED_SYRINGE | INTRAVENOUS | Status: DC | PRN
  Administered 2021-08-07: 40 mg via INTRAVENOUS

## 2021-08-07 MED ORDER — CHLORHEXIDINE GLUCONATE 0.12 % MT SOLN
OROMUCOSAL | Status: AC
  Administered 2021-08-07: 15 mL via OROMUCOSAL
  Filled 2021-08-07: qty 15

## 2021-08-07 MED ORDER — EPHEDRINE SULFATE (PRESSORS) 50 MG/ML IJ SOLN
INTRAMUSCULAR | Status: DC | PRN
  Administered 2021-08-07: 5 mg via INTRAVENOUS

## 2021-08-07 MED ORDER — MAGNESIUM HYDROXIDE 400 MG/5ML PO SUSP: 30.0000 mL | Freq: Every day | ORAL | Status: DC | PRN

## 2021-08-07 MED ORDER — CETIRIZINE HCL 10 MG PO TABS
10.0000 mg | ORAL_TABLET | Freq: Every evening | ORAL | Status: DC
  Administered 2021-08-07 – 2021-08-08 (×2): 10 mg via ORAL
  Filled 2021-08-07 (×2): qty 1

## 2021-08-07 MED ORDER — INSULIN ASPART 100 UNIT/ML IJ SOLN
0.0000 [IU] | Freq: Three times a day (TID) | INTRAMUSCULAR | Status: DC
  Administered 2021-08-07: 3 [IU] via SUBCUTANEOUS
  Administered 2021-08-07: 5 [IU] via SUBCUTANEOUS
  Administered 2021-08-08 – 2021-08-09 (×4): 3 [IU] via SUBCUTANEOUS
  Filled 2021-08-07 (×6): qty 1

## 2021-08-07 MED ORDER — ACETAMINOPHEN 10 MG/ML IV SOLN
INTRAVENOUS | Status: AC
  Filled 2021-08-07: qty 100

## 2021-08-07 MED ORDER — ATORVASTATIN CALCIUM 20 MG PO TABS
40.0000 mg | ORAL_TABLET | Freq: Every day | ORAL | Status: DC
  Administered 2021-08-07 – 2021-08-08 (×2): 40 mg via ORAL
  Filled 2021-08-07 (×2): qty 2

## 2021-08-07 MED ORDER — DARIFENACIN HYDROBROMIDE ER 7.5 MG PO TB24
7.5000 mg | ORAL_TABLET | Freq: Every day | ORAL | Status: DC
  Administered 2021-08-08 – 2021-08-09 (×2): 7.5 mg via ORAL
  Filled 2021-08-07 (×3): qty 1

## 2021-08-07 MED ORDER — ONDANSETRON HCL 4 MG PO TABS: 4.0000 mg | ORAL_TABLET | Freq: Four times a day (QID) | ORAL | Status: DC | PRN

## 2021-08-07 MED ORDER — ONDANSETRON HCL 4 MG/2ML IJ SOLN
INTRAMUSCULAR | Status: AC
  Filled 2021-08-07: qty 2

## 2021-08-07 MED ORDER — METOPROLOL SUCCINATE ER 25 MG PO TB24
75.0000 mg | ORAL_TABLET | Freq: Two times a day (BID) | ORAL | Status: DC
  Administered 2021-08-07 – 2021-08-09 (×5): 75 mg via ORAL
  Filled 2021-08-07 (×5): qty 3

## 2021-08-07 MED ORDER — VITAMIN D 25 MCG (1000 UNIT) PO TABS
1000.0000 [IU] | ORAL_TABLET | Freq: Every day | ORAL | Status: DC
  Administered 2021-08-08 – 2021-08-09 (×2): 1000 [IU] via ORAL
  Filled 2021-08-07 (×2): qty 1

## 2021-08-07 MED ORDER — NITROGLYCERIN 0.4 MG/SPRAY TL SOLN: 1.0000 | Status: DC | PRN

## 2021-08-07 MED ORDER — BUPIVACAINE HCL (PF) 0.5 % IJ SOLN
INTRAMUSCULAR | Status: AC
  Filled 2021-08-07: qty 10

## 2021-08-07 MED ORDER — TOPIRAMATE 100 MG PO TABS
50.0000 mg | ORAL_TABLET | Freq: Two times a day (BID) | ORAL | Status: DC
  Administered 2021-08-07 – 2021-08-09 (×4): 50 mg via ORAL
  Filled 2021-08-07 (×4): qty 2
  Filled 2021-08-07: qty 0.5
  Filled 2021-08-07: qty 2

## 2021-08-07 MED ORDER — PROPOFOL 500 MG/50ML IV EMUL
INTRAVENOUS | Status: DC | PRN
  Administered 2021-08-07: 120 ug/kg/min via INTRAVENOUS

## 2021-08-07 MED ORDER — KETOROLAC TROMETHAMINE 15 MG/ML IJ SOLN
15.0000 mg | Freq: Four times a day (QID) | INTRAMUSCULAR | Status: AC
  Administered 2021-08-07 – 2021-08-08 (×4): 15 mg via INTRAVENOUS
  Filled 2021-08-07 (×4): qty 1

## 2021-08-07 MED ORDER — ONDANSETRON HCL 4 MG/2ML IJ SOLN: 4.0000 mg | Freq: Four times a day (QID) | INTRAMUSCULAR | Status: DC | PRN

## 2021-08-07 MED ORDER — MIDAZOLAM HCL 2 MG/2ML IJ SOLN
INTRAMUSCULAR | Status: AC
  Administered 2021-08-07: 1 mg via INTRAVENOUS
  Filled 2021-08-07: qty 2

## 2021-08-07 MED ORDER — VITAMIN E 45 MG (100 UNIT) PO CAPS
400.0000 [IU] | ORAL_CAPSULE | Freq: Every day | ORAL | Status: DC
  Administered 2021-08-08 – 2021-08-09 (×2): 400 [IU] via ORAL
  Filled 2021-08-07 (×3): qty 4

## 2021-08-07 MED ORDER — SUMATRIPTAN SUCCINATE 50 MG PO TABS: 50.0000 mg | ORAL_TABLET | ORAL | Status: DC | PRN

## 2021-08-07 MED ORDER — 0.9 % SODIUM CHLORIDE (POUR BTL) OPTIME
TOPICAL | Status: DC | PRN
  Administered 2021-08-07: 500 mL

## 2021-08-07 MED ORDER — DEXAMETHASONE SODIUM PHOSPHATE 4 MG/ML IJ SOLN
INTRAMUSCULAR | Status: DC | PRN
  Administered 2021-08-07: 5 mg via INTRAVENOUS

## 2021-08-07 MED ORDER — CEFAZOLIN SODIUM-DEXTROSE 2-4 GM/100ML-% IV SOLN
INTRAVENOUS | Status: AC
  Filled 2021-08-07: qty 100

## 2021-08-07 MED ORDER — POTASSIUM CHLORIDE CRYS ER 20 MEQ PO TBCR
40.0000 meq | EXTENDED_RELEASE_TABLET | Freq: Two times a day (BID) | ORAL | Status: DC
  Administered 2021-08-07 – 2021-08-09 (×5): 40 meq via ORAL
  Filled 2021-08-07 (×5): qty 2

## 2021-08-07 MED ORDER — SURGIRINSE WOUND IRRIGATION SYSTEM - OPTIME
TOPICAL | Status: DC | PRN
  Administered 2021-08-07: 900 mL

## 2021-08-07 MED ORDER — TEMAZEPAM 7.5 MG PO CAPS: 7.5000 mg | ORAL_CAPSULE | Freq: Every evening | ORAL | Status: DC | PRN

## 2021-08-07 MED ORDER — MORPHINE SULFATE (PF) 4 MG/ML IV SOLN
0.5000 mg | INTRAVENOUS | Status: DC | PRN
  Administered 2021-08-07 – 2021-08-08 (×4): 1 mg via INTRAVENOUS
  Filled 2021-08-07 (×5): qty 1

## 2021-08-07 MED ORDER — CLONAZEPAM 0.25 MG PO TBDP
0.1250 mg | ORAL_TABLET | Freq: Three times a day (TID) | ORAL | Status: DC
  Administered 2021-08-07 – 2021-08-09 (×6): 0.125 mg via ORAL
  Filled 2021-08-07 (×7): qty 1

## 2021-08-07 MED ORDER — MONTELUKAST SODIUM 10 MG PO TABS
10.0000 mg | ORAL_TABLET | Freq: Every day | ORAL | Status: DC
  Administered 2021-08-07 – 2021-08-08 (×2): 10 mg via ORAL
  Filled 2021-08-07 (×2): qty 1

## 2021-08-07 MED ORDER — ASPIRIN 81 MG PO CHEW
81.0000 mg | CHEWABLE_TABLET | Freq: Two times a day (BID) | ORAL | Status: DC
  Administered 2021-08-07 – 2021-08-09 (×4): 81 mg via ORAL
  Filled 2021-08-07 (×4): qty 1

## 2021-08-07 MED ORDER — MIDAZOLAM HCL 5 MG/5ML IJ SOLN
INTRAMUSCULAR | Status: DC | PRN
  Administered 2021-08-07: 2 mg via INTRAVENOUS

## 2021-08-07 MED ORDER — BUPIVACAINE HCL (PF) 0.5 % IJ SOLN
INTRAMUSCULAR | Status: DC | PRN
  Administered 2021-08-07: 2.5 mL

## 2021-08-07 MED ORDER — BUPIVACAINE-EPINEPHRINE (PF) 0.5% -1:200000 IJ SOLN
INTRAMUSCULAR | Status: AC
  Filled 2021-08-07: qty 30

## 2021-08-07 MED ORDER — ROPINIROLE HCL 1 MG PO TABS
1.0000 mg | ORAL_TABLET | Freq: Three times a day (TID) | ORAL | Status: DC
  Administered 2021-08-07 – 2021-08-09 (×7): 1 mg via ORAL
  Filled 2021-08-07 (×7): qty 1

## 2021-08-07 SURGICAL SUPPLY — 59 items
BIT DRILL STRL 25MM (BIT) IMPLANT
BLADE SAGITTAL WIDE XTHICK NO (BLADE) ×2 IMPLANT
BNDG COHESIVE 4X5 TAN ST LF (GAUZE/BANDAGES/DRESSINGS) ×4 IMPLANT
BRUSH SCRUB EZ  4% CHG (MISCELLANEOUS) ×1
BRUSH SCRUB EZ 4% CHG (MISCELLANEOUS) ×1 IMPLANT
CHLORAPREP W/TINT 26 (MISCELLANEOUS) ×2 IMPLANT
COVER HOLE (Hips) ×1 IMPLANT
DRAPE 3/4 80X56 (DRAPES) ×2 IMPLANT
DRAPE C-ARM 42X72 X-RAY (DRAPES) ×2 IMPLANT
DRAPE U-SHAPE 47X51 STRL (DRAPES) ×2 IMPLANT
DRILL BIT STERILE 25MM (BIT) ×2
DRSG AQUACEL AG ADV 3.5X10 (GAUZE/BANDAGES/DRESSINGS) IMPLANT
DRSG AQUACEL AG ADV 3.5X14 (GAUZE/BANDAGES/DRESSINGS) IMPLANT
ELECT REM PT RETURN 9FT ADLT (ELECTROSURGICAL) ×2
ELECTRODE REM PT RTRN 9FT ADLT (ELECTROSURGICAL) ×1 IMPLANT
GAUZE 4X4 16PLY ~~LOC~~+RFID DBL (SPONGE) ×2 IMPLANT
GAUZE XEROFORM 1X8 LF (GAUZE/BANDAGES/DRESSINGS) ×1 IMPLANT
GLOVE BIO SURGEON STRL SZ8 (GLOVE) ×2 IMPLANT
GLOVE BIOGEL PI IND STRL 8.5 (GLOVE) ×2 IMPLANT
GLOVE BIOGEL PI INDICATOR 8.5 (GLOVE) ×2
GLOVE SURG ORTHO 8.5 STRL (GLOVE) ×2 IMPLANT
GOWN STRL REUS W/ TWL XL LVL3 (GOWN DISPOSABLE) ×2 IMPLANT
GOWN STRL REUS W/TWL XL LVL3 (GOWN DISPOSABLE) ×2
HEAD OXINIUM PLUS 0 32MM (Hips) ×1 IMPLANT
HOLSTER ELECTROSUGICAL PENCIL (MISCELLANEOUS) ×2 IMPLANT
HOOD PEEL AWAY FLYTE STAYCOOL (MISCELLANEOUS) ×6 IMPLANT
IV NS 250ML (IV SOLUTION)
IV NS 250ML BAXH (IV SOLUTION) IMPLANT
IV NS IRRIG 3000ML ARTHROMATIC (IV SOLUTION) ×2 IMPLANT
KIT PATIENT CARE HANA TABLE (KITS) ×2 IMPLANT
KIT TURNOVER CYSTO (KITS) ×2 IMPLANT
LINER 3H HEMI SHELL 48MM (Liner) ×1 IMPLANT
LINER ACETABULAR 32X48 (Liner) ×1 IMPLANT
MANIFOLD NEPTUNE II (INSTRUMENTS) ×2 IMPLANT
MAT ABSORB  FLUID 56X50 GRAY (MISCELLANEOUS) ×1
MAT ABSORB FLUID 56X50 GRAY (MISCELLANEOUS) ×1 IMPLANT
NDL SAFETY ECLIPSE 18X1.5 (NEEDLE) IMPLANT
NDL SPNL 20GX3.5 QUINCKE YW (NEEDLE) ×1 IMPLANT
NEEDLE HYPO 18GX1.5 SHARP (NEEDLE)
NEEDLE HYPO 22GX1.5 SAFETY (NEEDLE) ×2 IMPLANT
NEEDLE SPNL 20GX3.5 QUINCKE YW (NEEDLE) ×2 IMPLANT
PACK HIP PROSTHESIS (MISCELLANEOUS) ×2 IMPLANT
PADDING CAST BLEND 4X4 NS (MISCELLANEOUS) ×4 IMPLANT
PILLOW ABDUCTION MEDIUM (MISCELLANEOUS) ×2 IMPLANT
PULSAVAC PLUS IRRIG FAN TIP (DISPOSABLE) ×2
SCREW 6.5X25MM (Screw) ×1 IMPLANT
SOLUTION IRRIG SURGIPHOR (IV SOLUTION) ×2 IMPLANT
SPONGE T-LAP 18X18 ~~LOC~~+RFID (SPONGE) ×8 IMPLANT
STAPLER SKIN PROX 35W (STAPLE) ×2 IMPLANT
STEM LATERAL COLLAR LAT1 12/14 (Stem) ×1 IMPLANT
SUT BONE WAX W31G (SUTURE) ×2 IMPLANT
SUT DVC 2 QUILL PDO  T11 36X36 (SUTURE) ×1
SUT DVC 2 QUILL PDO T11 36X36 (SUTURE) ×1 IMPLANT
SUT VIC AB 2-0 CT1 18 (SUTURE) ×2 IMPLANT
SYR 20ML LL LF (SYRINGE) ×2 IMPLANT
SYR 30ML LL (SYRINGE) ×1 IMPLANT
TIP FAN IRRIG PULSAVAC PLUS (DISPOSABLE) ×1 IMPLANT
WAND WEREWOLF FASTSEAL 6.0 (MISCELLANEOUS) ×2 IMPLANT
WATER STERILE IRR 500ML POUR (IV SOLUTION) ×2 IMPLANT

## 2021-08-08 ENCOUNTER — Encounter: Payer: Self-pay | Admitting: Orthopedic Surgery

## 2021-08-08 DIAGNOSIS — M1612 Unilateral primary osteoarthritis, left hip: Secondary | ICD-10-CM | POA: Diagnosis not present

## 2021-08-08 LAB — GLUCOSE, CAPILLARY
Glucose-Capillary: 178 mg/dL — ABNORMAL HIGH (ref 70–99)
Glucose-Capillary: 187 mg/dL — ABNORMAL HIGH (ref 70–99)
Glucose-Capillary: 188 mg/dL — ABNORMAL HIGH (ref 70–99)
Glucose-Capillary: 217 mg/dL — ABNORMAL HIGH (ref 70–99)

## 2021-08-08 LAB — SURGICAL PATHOLOGY

## 2021-08-08 NOTE — Progress Notes (Signed)
PT Cancellation Note  Patient Details Name: Marie Clarke MRN: [id number] DOB: 29-Dec-1970   Cancelled Treatment:    Reason Eval/Treat Not Completed: Other (comment) PT attempted to see pt two times this afternoon with pt reporting pain in L hip at 9/10 on both attempts. Pt had already received pain meds prior to first attempt, but is requesting morphine (communicated with nursing). Pt not agreeable to PT this afternoon and requested we come back tomorrow morning. Will re-attempt next date.  Jearlean Demauro, SPT Monie Shere 08/08/2021, 2:36 PM

## 2021-08-08 NOTE — Care Management Obs Status (Signed)
MEDICARE OBSERVATION STATUS NOTIFICATION   Patient Details  Name: Marie Clarke MRN: [id number] Date of Birth: 10-22-1970   Medicare Observation Status Notification Given:  Yes    Marlowe Sax, RN 08/08/2021, 2:22 PM

## 2021-08-08 NOTE — TOC Progression Note (Signed)
Transition of Care [facility]) - Progression Note    Patient Details  Name: Marie Clarke MRN: [id number] Date of Birth: 1970-08-23  [facility]) CM/SW Contact  Marlowe Sax, RN Phone Number: 08/08/2021, 10:34 AM  Clinical Narrative:     The patient requested that I call eldercare and check on a tri walker that they are assisting her to get, I explained that I would be able to get a regular 2 wheel walker for her here at the hospital, she stated that would not work for her in the space that she has, I explained it is not recommended that she use a walker with wheels on all legs for safety after her surgery and explained for balance would be better to use the RW with only 2 wheels in the front, She declined and said the 2 wheel walker would not fit in her space I called eldercare at [phone] and left a voice mail asking for a return call.        Expected Discharge Plan and Services                                                 Social Determinants of Health (SDOH) Interventions    Readmission Risk Interventions    03/31/2020   10:43 AM  Readmission Risk Prevention Plan  Transportation Screening Complete  PCP or Specialist Appt within 3-5 Days Complete  HRI or Home Care Consult Complete  Social Work Consult for Recovery Care Planning/Counseling Complete  Palliative Care Screening Not Applicable  Medication Review Oceanographer) Complete

## 2021-08-08 NOTE — Anesthesia Postprocedure Evaluation (Signed)
Anesthesia Post Note  Patient: Marie Clarke  Procedure(s) Performed: TOTAL HIP ARTHROPLASTY ANTERIOR APPROACH (Left: Hip)  Patient location during evaluation: Nursing Unit Anesthesia Type: Spinal Level of consciousness: oriented and awake and alert Pain management: pain level controlled Vital Signs Assessment: post-procedure vital signs reviewed and stable Respiratory status: spontaneous breathing and respiratory function stable Cardiovascular status: blood pressure returned to baseline and stable Postop Assessment: no headache, no backache, no apparent nausea or vomiting and patient able to bend at knees Anesthetic complications: no   No notable events documented.   Last Vitals:  Vitals:   08/08/21 0629 08/08/21 0756  BP: 108/69 104/64  Pulse: (!) 107 (!) 101  Resp: 18 17  Temp: 36.8 C   SpO2: 98% 99%    Last Pain:  Vitals:   08/08/21 0756  TempSrc:   PainSc: 3                  Lynden Oxford

## 2021-08-09 DIAGNOSIS — M1612 Unilateral primary osteoarthritis, left hip: Secondary | ICD-10-CM | POA: Diagnosis not present

## 2021-08-09 LAB — GLUCOSE, CAPILLARY: Glucose-Capillary: 151 mg/dL — ABNORMAL HIGH (ref 70–99)

## 2021-08-09 MED ORDER — DOCUSATE SODIUM 100 MG PO CAPS: 100.0000 mg | ORAL_CAPSULE | Freq: Two times a day (BID) | ORAL | 0 refills | Status: AC

## 2021-08-09 MED ORDER — ASPIRIN 81 MG PO CHEW
81.0000 mg | CHEWABLE_TABLET | Freq: Two times a day (BID) | ORAL | 0 refills | Status: AC
Start: 2021-08-09 — End: ?

## 2021-08-09 MED ORDER — HYDROCODONE-ACETAMINOPHEN 7.5-325 MG PO TABS: 1.0000 | ORAL_TABLET | ORAL | 0 refills | Status: AC | PRN

## 2021-08-09 NOTE — Progress Notes (Signed)
Physical Therapy Treatment Patient Details Name: Marie Clarke MRN: [id number] DOB: 12/26/1970 Today's Date: 08/09/2021   History of Present Illness Pt admitted for L THR. Pt was POD 0 at time of evaluation.    PT Comments    PT attempted to see pt 2 times this morning with pt refusing without L knee immobilizer donned. Messaged MD and received orders for L KI when walking. L KI donned for pt comfort during ambulation. Pt ambulated 200 feet with RW and CGA. Pt met all PT goals. Continue to recommend HH PT at discharge.   Recommendations for follow up therapy are one component of a multi-disciplinary discharge planning process, led by the attending physician.  Recommendations may be updated based on patient status, additional functional criteria and insurance authorization.  Follow Up Recommendations  Home health PT     Assistance Recommended at Discharge Set up Supervision/Assistance  Patient can return home with the following A little help with walking and/or transfers;A little help with bathing/dressing/bathroom;Help with stairs or ramp for entrance   Equipment Recommendations  BSC/3in1    Recommendations for Other Services       Precautions / Restrictions Precautions Precautions: Fall;Anterior Hip Precaution Booklet Issued: Yes (comment) Required Braces or Orthoses: Knee Immobilizer - Left Knee Immobilizer - Left: Other (comment) (when walking) Restrictions Weight Bearing Restrictions: Yes LLE Weight Bearing: Weight bearing as tolerated     Mobility  Bed Mobility               General bed mobility comments: NT due to pt received and left in chair    Transfers Overall transfer level: Needs assistance Equipment used: Rolling walker (2 wheels) Transfers: Sit to/from Stand Sit to Stand: Modified independent (Device/Increase time)           General transfer comment: pt began standing prior to instruction; pt required verbal cues for sitting with knee immobilizer  donned and the need to kick out L LE since knee will not flex    Ambulation/Gait Ambulation/Gait assistance: Min guard Gait Distance (Feet): 200 Feet Assistive device: Rolling walker (2 wheels) Gait Pattern/deviations: Step-through pattern (L knee immobilizer donned preventing L knee flex during gait)       General Gait Details: RW utilized during gait with pt using continuous step-through gait pattern with good WB tolerance through L LE. However, knee immobilizer donned on L LE prevented normal gait pattern.   Stairs             Wheelchair Mobility    Modified Rankin (Stroke Patients Only)       Balance Overall balance assessment: Needs assistance Sitting-balance support: Feet supported Sitting balance-Leahy Scale: Good Sitting balance - Comments: pt sitting in chair with back unsupported and no UE support. no LOB   Standing balance support: Bilateral upper extremity supported Standing balance-Leahy Scale: Good Standing balance comment: pt able to squat at toilet to complete hygiene without UE support with supervision and no LOB                            Cognition Arousal/Alertness: Awake/alert Behavior During Therapy: Anxious, Impulsive Overall Cognitive Status: Within Functional Limits for tasks assessed                                 General Comments: continues to be impulsive with mobilizing prior to instruction; pt anxious to complete PT in order   to go home today        Exercises Other Exercises Other Exercises: ambulated to bathroom with cga. Able to void and perform hygiene with supervision.    General Comments        Pertinent Vitals/Pain Pain Assessment Pain Assessment: Faces Faces Pain Scale: Hurts a little bit Pain Location: L hip Pain Descriptors / Indicators: Operative site guarding Pain Intervention(s): Limited activity within patient's tolerance, Monitored during session, Premedicated before session,  Repositioned    Home Living                          Prior Function            PT Goals (current goals can now be found in the care plan section) Acute Rehab PT Goals Patient Stated Goal: to go home today PT Goal Formulation: With patient Time For Goal Achievement: 08/21/21 Potential to Achieve Goals: Good Progress towards PT goals: Progressing toward goals    Frequency    BID      PT Plan Current plan remains appropriate    Co-evaluation              AM-PAC PT "6 Clicks" Mobility   Outcome Measure  Help needed turning from your back to your side while in a flat bed without using bedrails?: None Help needed moving from lying on your back to sitting on the side of a flat bed without using bedrails?: None Help needed moving to and from a bed to a chair (including a wheelchair)?: A Little Help needed standing up from a chair using your arms (e.g., wheelchair or bedside chair)?: A Little Help needed to walk in hospital room?: A Little Help needed climbing 3-5 steps with a railing? : A Little 6 Click Score: 20    End of Session Equipment Utilized During Treatment: Gait belt Activity Tolerance: Patient tolerated treatment well Patient left: in chair;with call bell/phone within reach Nurse Communication: Mobility status PT Visit Diagnosis: Unsteadiness on feet (R26.81);Muscle weakness (generalized) (M62.81);Difficulty in walking, not elsewhere classified (R26.2);Pain Pain - Right/Left: Left Pain - part of body: Hip     Time: 1003-1017 PT Time Calculation (min) (ACUTE ONLY): 14 min  Charges:                        Rella Larve, SPT  Jezlyn Westerfield 08/09/2021, 11:36 AM

## 2021-08-09 NOTE — Discharge Instructions (Signed)

## 2021-08-09 NOTE — Plan of Care (Signed)
Patient discharged per MD orders at this time.All discharge instructions,education and medications reviewed with the patient.Pt expressed understanding and will comply with dc instructions.follow up appointments was also communicated to the patient.no verbal c/o or any ssx of distress.Pt was discharged home with HH/PT services per order.Pt was transported home by brother in a privately owned vehicle.

## 2021-08-09 NOTE — Progress Notes (Signed)
  Subjective:  Patient reports pain as mild to moderate.    Objective:   VITALS:   Vitals:   08/08/21 1119 08/08/21 1514 08/08/21 1956 08/09/21 0803  BP: 128/86 111/66 108/64 134/80  Pulse: (!) 102 (!) 102 (!) 106 (!) 104  Resp: '17 17 18 18  '$ Temp:  (!) 97.3 F (36.3 C) 97.9 F (36.6 C) 99.6 F (37.6 C)  TempSrc:      SpO2: 100% 100% 100% 99%  Weight:      Height:        PHYSICAL EXAM:  Neurologically intact ABD soft Neurovascular intact Sensation intact distally Intact pulses distally Dorsiflexion/Plantar flexion intact Incision: dressing C/D/I No cellulitis present Compartment soft  LABS  Results for orders placed or performed during the hospital encounter of 08/07/21 (from the past 24 hour(s))  Glucose, capillary     Status: Abnormal   Collection Time: 08/08/21 11:23 AM  Result Value Ref Range   Glucose-Capillary 187 (H) 70 - 99 mg/dL  Glucose, capillary     Status: Abnormal   Collection Time: 08/08/21  4:50 PM  Result Value Ref Range   Glucose-Capillary 188 (H) 70 - 99 mg/dL  Glucose, capillary     Status: Abnormal   Collection Time: 08/08/21 10:24 PM  Result Value Ref Range   Glucose-Capillary 217 (H) 70 - 99 mg/dL  Glucose, capillary     Status: Abnormal   Collection Time: 08/09/21  7:37 AM  Result Value Ref Range   Glucose-Capillary 151 (H) 70 - 99 mg/dL    No results found.  Assessment/Plan: 2 Days Post-Op   Principal Problem:   History of total hip replacement, left   Advance diet Up with therapy Apply knee immobilizer to left knee when walking May discharge after PT   Carlynn Spry , PA-C 08/09/2021, 9:34 AM

## 2021-08-09 NOTE — TOC Progression Note (Signed)
Transition of Care [facility]) - Progression Note    Patient Details  Name: Cozetta Seif MRN: [id number] Date of Birth: [birth year]  Transition of Care [facility]) CM/SW [place], RN Phone Number: 08/09/2021, 8:41 AM  Clinical Narrative:    Patient called and requested a walker like the one that belongs to the hospital, I explained that if her insurance has provided one within the last 5 years then they will not pay for another one, She stated understanding and requested to get one anyway.  I notified [employer] of the need for a walker, it will be delivered to her room   Expected Discharge Plan: [place] Barriers to Discharge: Barriers Resolved  Expected Discharge Plan and Services Expected Discharge Plan: [place]   Discharge Planning Services: CM Consult   Living arrangements for the past 2 months: Single Family Home                 DME Arranged: N/A DME Agency: NA       HH Arranged: PT HH Agency: [facility] ([place]) Date HH Agency Contacted: 08/08/21 Time [place]: 1110 Representative spoke with at [place]: [place] ([place]) Interventions    Readmission Risk Interventions    03/31/2020   10:43 AM  Readmission Risk Prevention Plan  Transportation Screening Complete  PCP or Specialist Appt within 3-5 Days Complete  HRI or [place] Complete  Social Work Consult for [place] Planning/Counseling Complete  Palliative Care Screening Not Applicable  Medication Review Press photographer) Complete

## 2021-08-09 NOTE — Discharge Summary (Signed)
Physician Discharge Summary  Patient ID: Marie Clarke MRN: [id number] DOB/AGE: December 07, 1970 51 y.o.  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, left hip Principal Problem:   History of total hip replacement, left   Past Medical History:  Diagnosis Date   Anemia    Anginal pain (HCC)    Anxiety    Arthritis    Asthma    Bilateral lower extremity edema    Bipolar disorder (HCC)    CAD (coronary artery disease) unk   CHF (congestive heart failure) (HCC)    Chronic pain syndrome    COPD (chronic obstructive pulmonary disease) ([place])    emphysema   DDD (degenerative disc disease), lumbar    Depression unk   Diabetes mellitus without complication ([place])    Diabetes mellitus, type II ([place])    Drug overdose May 06, 2013   after dad died   Dysrhythmia    history of tachy arrythmias   Family history of adverse reaction to anesthesia    sister had shortness of breath after surgery   Fibromyalgia    GERD (gastroesophageal reflux disease)    Headache    chronic migraines   Hyperlipidemia    Hypertension    Hypothyroidism    Left leg pain 04/29/2014   MI (myocardial infarction) ([place]) 05/06/05   Muscle ache 09/16/2014   Osteoporosis    Overactive bladder    Pancreatitis unk   Panic attacks    PTSD (post-traumatic stress disorder)    Reflex sympathetic dystrophy    Renal cyst, left    Renal insufficiency    ckd stage iii   Restless legs syndrome    Sleep apnea 06/2019   uses cpap   Stage 3a chronic kidney disease ([place])    Stroke ([place]) May 06, 2008   TIA x 2. no residual deficits   Suicide attempt [facility])    after father died.   Thyroid disease    thyroid nodule   TIA (transient ischemic attack) unk   TIA (transient ischemic attack)     Surgeries: Procedure(s): TOTAL HIP ARTHROPLASTY ANTERIOR APPROACH on  08/07/2021   Consultants (if any):   Discharged Condition: Improved  Hospital Course: Marie Clarke is an 51 y.o. 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    She was given perioperative antibiotics:  Anti-infectives (From admission, onward)    Start     Dose/Rate Route Frequency Ordered Stop   08/07/21 1200  vancomycin (VANCOCIN) IVPB 1000 mg/200 mL premix        1,000 mg 200 mL/hr over 60 Minutes Intravenous  Once 08/07/21 1033 08/07/21 1410   08/07/21 0636  ceFAZolin (ANCEF) 2-4 GM/100ML-% IVPB       Note to Pharmacy: Jeanene Erb E: cabinet override      08/07/21 0636 08/07/21 0750   08/07/21 0600  ceFAZolin (ANCEF) IVPB 2g/100 mL premix        2 g 200 mL/hr over 30 Minutes Intravenous On call to O.R. 08/06/21 05-07-54 08/07/21 0800     .  She was given sequential compression devices, early ambulation, and aspirin 81 mg twice daily for 30 days for DVT prophylaxis.  She benefited maximally from the hospital stay and there were no complications.  Recent vital signs:  Vitals:   08/08/21 1956 08/09/21 0803  BP: 108/64 134/80  Pulse: (!) 106 (!) 104  Resp: 18 18  Temp: 97.9 F (36.6 C) 99.6 F (37.6 C)  SpO2: 100% 99%    Recent laboratory studies:  Lab Results  Component Value Date   HGB 10.7 (L) 08/04/2021   HGB 11.4 (L) 07/04/2021   HGB 11.9 (L) 03/25/2021   Lab Results  Component Value Date   WBC 5.0 08/04/2021   PLT 268 08/04/2021   Lab Results  Component Value Date   INR 1.1 09/30/2020   Lab Results  Component Value Date   NA 137 08/04/2021   K 3.4 (L) 08/04/2021   CL 100 08/04/2021   CO2 29 08/04/2021   BUN 18 08/04/2021   CREATININE 0.79 08/04/2021   GLUCOSE 191 (H) 08/04/2021    Discharge Medications:   Allergies as of 08/09/2021       Reactions   Diazepam  Hives, Nausea And Vomiting   Ziprasidone Hcl Itching, Other (See Comments)   Confusion   Azithromycin Hives   Divalproex Sodium Hives   Levofloxacin Hives, Rash   1/31: would like to retry since not taking valium   Lisinopril Other (See Comments)   Medicine cause kidney failure   Metronidazole Hives   1/31: would like to retry since she is no longer taking valium   Sulfa Antibiotics Hives   Valproic Acid Itching, Rash   Cephalexin Hives   Ciprofloxacin Hives   Doxycycline Rash   Penicillins Hives   Has patient had a PCN reaction causing immediate rash, facial/tongue/throat swelling, SOB or lightheadedness with hypotension: No Has patient had a PCN reaction causing severe rash involving mucus membranes or skin necrosis: No Has patient had a PCN reaction that required hospitalization: No Has patient had a PCN reaction occurring within the last 10 years: No If all of the above answers are "NO", then may proceed with Cephalosporin use.        Medication List     STOP taking these medications    aspirin EC 81 MG tablet Replaced by: aspirin 81 MG chewable tablet   HYDROcodone-acetaminophen 5-325 MG tablet Commonly known as: NORCO/VICODIN Replaced by: HYDROcodone-acetaminophen 7.5-325 MG tablet       TAKE these medications    aspirin 81 MG chewable tablet Chew 1 tablet (81 mg total) by mouth 2 (two) times daily. Replaces: aspirin EC 81 MG tablet   atorvastatin 40 MG tablet Commonly known as: LIPITOR Take 40 mg by mouth at bedtime.   cholecalciferol 25 MCG (1000 UNIT) tablet Commonly known as: VITAMIN D3 Take 1,000 Units by mouth daily.   clonazepam 0.125 MG disintegrating tablet Commonly known as: KLONOPIN Take 0.125 mg by mouth 3 (three) times daily.   cyanocobalamin 1000 MCG tablet Take 1,000 mcg by mouth daily.   diclofenac 75 MG EC tablet Commonly known as: VOLTAREN Take 75 mg by mouth 2 (two) times daily.   dicyclomine 10 MG capsule Commonly known as:  BENTYL Take 10 mg by mouth 3 (three) times daily as needed for spasms.   docusate sodium 100 MG capsule Commonly known as: COLACE Take 1 capsule (100 mg total) by mouth 2 (two) times daily.   esomeprazole 40 MG capsule Commonly known as: NEXIUM Take 40 mg by mouth at bedtime.   febuxostat 40 MG tablet Commonly known as: ULORIC Take 40 mg by mouth daily.   Fish Oil 1000 MG Caps Take 1 capsule by mouth  daily.   furosemide 80 MG tablet Commonly known as: LASIX Take 80 mg by mouth 2 (two) times daily.   gabapentin 600 MG tablet Commonly known as: NEURONTIN Take 600 mg by mouth 3 (three) times daily.   HYDROcodone-acetaminophen 7.5-325 MG tablet Commonly known as: NORCO Take 1-2 tablets by mouth every 4 (four) hours as needed for severe pain or moderate pain (pain score 7-10). Replaces: HYDROcodone-acetaminophen 5-325 MG tablet   Jardiance 10 MG Tabs tablet Generic drug: empagliflozin Take 10 mg by mouth daily.   levocetirizine 5 MG tablet Commonly known as: XYZAL Take 5 mg by mouth every evening.   levothyroxine 200 MCG tablet Commonly known as: SYNTHROID Take 200 mcg by mouth daily before breakfast. 1 tablet everyday except for 'Sunday; only take 1/2 tablet (100 mcg) on Sunday.   metoprolol succinate 50 MG 24 hr tablet Commonly known as: TOPROL-XL Take 75 mg by mouth 2 (two) times daily.   montelukast 10 MG tablet Commonly known as: SINGULAIR Take 10 mg by mouth at bedtime.   MULTIPLE VITAMIN/IRON PO Take 1 tablet by mouth daily.   multivitamin tablet Take 1 tablet by mouth daily.   nitroGLYCERIN 0.4 MG/SPRAY spray Commonly known as: NITROLINGUAL Place 1 spray under the tongue as needed for chest pain.   ondansetron 4 MG disintegrating tablet Commonly known as: ZOFRAN-ODT Take 4 mg by mouth every 8 (eight) hours as needed for nausea or vomiting.   potassium chloride SA 20 MEQ tablet Commonly known as: KLOR-CON M Take 40 mEq by mouth 2 (two) times  daily.   ProAir HFA 108 (90 Base) MCG/ACT inhaler Generic drug: albuterol Inhale 2 puffs into the lungs every 6 (six) hours as needed for wheezing or shortness of breath.   albuterol (2.5 MG/3ML) 0.083% nebulizer solution Commonly known as: PROVENTIL Take 2.5 mg by nebulization every 6 (six) hours as needed for wheezing or shortness of breath.   rizatriptan 10 MG tablet Commonly known as: MAXALT Take 10 mg by mouth as needed for migraine.   rOPINIRole 1 MG tablet Commonly known as: REQUIP Take 1 mg by mouth 3 (three) times daily.   solifenacin 5 MG tablet Commonly known as: VESICARE Take 5 mg by mouth daily.   Symbicort 160-4.5 MCG/ACT inhaler Generic drug: budesonide-formoterol Inhale 2 puffs into the lungs 2 (two) times daily.   temazepam 7.5 MG capsule Commonly known as: RESTORIL Take 7.5 mg by mouth at bedtime as needed for sleep.   topiramate 50 MG tablet Commonly known as: TOPAMAX Take 50 mg by mouth 2 (two) times daily.   vitamin C 500 MG tablet Commonly known as: ASCORBIC ACID Take 500 mg by mouth daily.   vitamin E 180 MG (400 UNITS) capsule Take 400 Units by mouth daily.   Vraylar 3 MG capsule Generic drug: cariprazine Take 3 mg by mouth at bedtime.               Durable Medical Equipment  (From admission, onward)           Start     Ordered   08/07/21 1034  DME Walker rolling  Once       Question:  Patient needs a walker to treat with the following condition  Answer:  History of total hip replacement, left   08/07/21 1033   08/07/21 1034  DME 3 n 1  Once        06'$ /26/23 1033   08/07/21 1034  DME Bedside commode  Once  Question:  Patient needs a bedside commode to treat with the following condition  Answer:  History of total hip replacement, left   08/07/21 1033            Diagnostic Studies: DG HIP UNILAT WITH PELVIS 1V LEFT  Result Date: 08/07/2021 CLINICAL DATA:  Left total hip replacement EXAM: DG HIP (WITH OR WITHOUT  PELVIS) 1V*L* COMPARISON:  None Available. FINDINGS: Two images depicting frontal projections of the left lower pelvis and left hip demonstrate a left total hip prosthesis in place with satisfactory positioning and with no visible early complicating features such as periprosthetic fracture. Clips noted along the left lower pelvis and in the vicinity of the sigmoid colon anastomosis. There is some mild to moderate spurring of the right femoral head. IMPRESSION: 1. Left total hip prosthesis in place without visible early complicating feature. Electronically Signed   By: Van Clines M.D.   On: 08/07/2021 09:36   DG C-Arm 1-60 Min-No Report  Result Date: 08/07/2021 Fluoroscopy was utilized by the requesting physician.  No radiographic interpretation.   DG C-Arm 1-60 Min-No Report  Result Date: 08/07/2021 Fluoroscopy was utilized by the requesting physician.  No radiographic interpretation.    Disposition:        Signed: Carlynn Spry ,PA-C 08/09/2021, 9:39 AM

## 2021-08-14 ENCOUNTER — Ambulatory Visit: Payer: Medicare (Managed Care) | Admitting: Cardiovascular Disease

## 2021-08-30 NOTE — H&P (Signed)
NAME: Marie Clarke MRN:   [id number] DOB:   1970/12/18     HISTORY AND PHYSICAL    CHIEF COMPLAINT:  left hip pain   HISTORY:   Marie Clarke a 51 y.o. female  with left  Hip Pain Patient complains of left hip pain. Onset of the symptoms was several years ago. Inciting event: known DJD. The patient reports the hip pain is worse with weight bearing. Associated symptoms: none. Aggravating symptoms include: any weight bearing. Patient has had prior hip problems. Previous visits for this problem: yes, last seen several weeks ago by me. Evaluation to date: plain films, which were abnormal  osteoarthritis . Treatment to date: OTC analgesics, which have been somewhat effective, prescription analgesics, which have been somewhat effective, and home exercise program, which has been somewhat effective.    Plan for left total hip replacement   PAST MEDICAL HISTORY:       Past Medical History:  Diagnosis Date   Anemia     Anginal pain (HCC)     Anxiety     Arthritis     Asthma     Bilateral lower extremity edema     Bipolar disorder (HCC)     CAD (coronary artery disease) unk   CHF (congestive heart failure) (HCC)     Chronic pain syndrome     COPD (chronic obstructive pulmonary disease) ([place])      emphysema   DDD (degenerative disc disease), lumbar     Depression unk   Diabetes mellitus without complication ([place])     Diabetes mellitus, type II ([place])     Drug overdose May 17, 2013    after dad died   Dysrhythmia      history of tachy arrythmias   Fibromyalgia     GERD (gastroesophageal reflux disease)     Headache      chronic migraines   Hyperlipidemia     Hypertension     Hypothyroidism     Left leg pain 04/29/2014   MI (myocardial infarction) ([place]) 05-17-2005   Muscle ache 09/16/2014   Osteoporosis     Overactive bladder     Pancreatitis unk   Panic attacks     PTSD (post-traumatic stress disorder)     Reflex sympathetic dystrophy     Renal cyst, left     Renal insufficiency      ckd stage  iii   Restless legs syndrome     Sleep apnea 06/2019    uses cpap   Sleep apnea     Stroke ([place]) May 17, 2008    TIA x 2. no residual deficits   Suicide attempt [facility])      after father died.   Thyroid disease      thyroid nodule   TIA (transient ischemic attack) unk   TIA (transient ischemic attack)        PAST SURGICAL HISTORY:        Past Surgical History:  Procedure Laterality Date   ABDOMINAL HYSTERECTOMY       CARDIAC CATHETERIZATION       HERNIA REPAIR   07/15/2017    [facility]   lumbar facet        several   MELANOMA EXCISION   03/2020    OUTSIDE OF RECTUM   prolapse rectum surgery N/A 08/2014   SPINAL CORD STIMULATOR REMOVAL N/A 11/07/2020    Procedure: REMOVAL OF THORACIC SPINAL CORD STIMULATOR & PULSE GENERATOR;  Surgeon: [name], MD;  Location: [facility] ORS;  Service: Neurosurgery;  Laterality: N/A;   THORACIC LAMINECTOMY FOR SPINAL CORD STIMULATOR N/A 07/27/2019    Procedure: THORACIC SPINAL CORD STIMULATOR PLACEMENT WITH RIGHT FLANK PULSE GENERATOR;  Surgeon: [name], MD;  Location: [facility] ORS;  Service: Neurosurgery;  Laterality: N/A;   TONSILLECTOMY          MEDICATIONS:  (Not in a hospital admission)     ALLERGIES:        Allergies  Allergen Reactions   Diazepam Hives and Nausea And Vomiting   Ziprasidone Hcl Itching and Other (See Comments)      Confusion     Azithromycin Hives   Divalproex Sodium Hives   Levofloxacin Hives and Rash      1/31: would like to retry since not taking valium   Lisinopril Other (See Comments)      Medicine cause kidney failure   Metronidazole Hives      1/31: would like to retry since she is no longer taking valium   Sulfa Antibiotics Hives   Valproic Acid Itching and Rash   Cephalexin Hives   Ciprofloxacin Hives   Doxycycline Rash   Penicillins Hives      Has patient had a PCN reaction causing immediate rash, facial/tongue/throat swelling, SOB or lightheadedness with hypotension: No Has patient had a PCN reaction causing  severe rash involving mucus membranes or skin necrosis: No Has patient had a PCN reaction that required hospitalization: No Has patient had a PCN reaction occurring within the last 10 years: No If all of the above answers are "NO", then may proceed with Cephalosporin use.        REVIEW OF SYSTEMS:   Negative except HPI   FAMILY HISTORY:        Family History  Problem Relation Age of Onset   Diabetes Mellitus II Mother     CAD Mother     Sleep apnea Mother     Osteoarthritis Mother     Osteoporosis Mother     Anxiety disorder Mother     Depression Mother     Bipolar disorder Mother     Bipolar disorder Father     Hypertension Father     Depression Father     Anxiety disorder Father     Post-traumatic stress disorder Sister        SOCIAL HISTORY:   reports that she quit smoking about 2 years ago. Her smoking use included cigarettes. She smoked an average of .5 packs per day. She has never used smokeless tobacco. She reports that she does not drink alcohol and does not use drugs.   PHYSICAL EXAM:  General appearance: alert, cooperative, and no distress Resp: clear to auscultation bilaterally GI: soft, non-tender; bowel sounds normal; no masses,  no organomegaly Extremities: extremities normal, atraumatic, no cyanosis or edema and Homans sign is negative, no sign of DVT Pulses: 2+ and symmetric Skin: Skin color, texture, turgor normal. No rashes or lesions Neurologic: Alert and oriented X 3, normal strength and tone. Normal symmetric reflexes. Normal coordination and gait      LABORATORY STUDIES: Recent Labs (last 2 labs)   No results for input(s): "WBC", "HGB", "HCT", "PLT" in the last 72 hours.                Recent Labs (last 2 labs)   No results for input(s): "NA", "K", "CL", "CO2", "GLUCOSE", "BUN", "CREATININE", "CALCIUM" in the last 72 hours.     STUDIES/RESULTS:  Imaging Results  CT Abdomen Pelvis W Contrast   Result Date: 07/04/2021 CLINICAL DATA:   Abdominal pain postop.  Dizziness EXAM: CT ABDOMEN AND PELVIS WITH CONTRAST TECHNIQUE: Multidetector CT imaging of the abdomen and pelvis was performed using the standard protocol following bolus administration of intravenous contrast. RADIATION DOSE REDUCTION: This exam was performed according to the departmental dose-optimization program which includes automated exposure control, adjustment of the mA and/or kV according to patient size and/or use of iterative reconstruction technique. CONTRAST:  171m OMNIPAQUE IOHEXOL 300 MG/ML  SOLN COMPARISON:  None Available. FINDINGS: Lower chest: Lung bases are clear. Hepatobiliary: No focal hepatic lesion. No biliary duct dilatation. Common bile duct is normal. Pancreas: Pancreas is normal. No ductal dilatation. No pancreatic inflammation. Spleen: Normal spleen Adrenals/urinary tract: Nodule of the lateral limb LEFT adrenal gland measures 19 mm x 19 mm. The lesion demonstrates 42% washout from portal venous phase to delayed phase consistent with benign adenoma. RIGHT adrenal gland normal. No renal mass lesion. No ureteral obstruction. Bladder normal. Stomach/Bowel: Stomach, duodenum and small bowel normal. Appendix normal. Ascending transverse descending colon normal. Anastomosis in the mid sigmoid colon without complication Vascular/Lymphatic: Abdominal aorta is normal caliber. No periportal or retroperitoneal adenopathy. No pelvic adenopathy. Reproductive: Post hysterectomy.  Adnexa unremarkable Other: There is a midline ventral wound which appears well-healed. There is subcutaneous thickening midline superficial to the rectus muscles (image 74/2). There is no clear fluid collection associated with this subcutaneous thickening. Musculoskeletal: No aggressive osseous lesion. IMPRESSION: 1. Subcutaneous thickening along the surgical wound midline superficial to the rectus muscle. No organized collections. Findings appear to represent benign sterile postsurgical change. 2.  Sigmoid colon anastomosis without complication. 3. Benign adenoma of the LEFT renal. Electronically Signed   By: SSuzy BouchardM.D.   On: 07/04/2021 14:37    DG Chest 2 View   Result Date: 07/04/2021 CLINICAL DATA:  Shortness of breath EXAM: CHEST - 2 VIEW COMPARISON:  Previous studies including the examination of 03/18/2021 FINDINGS: Cardiac size is within normal limits. There is peribronchial thickening. There is poor inspiration. Interstitial markings in the parahilar regions and lower lung fields are prominent. There is no focal consolidation. There is no pleural effusion or pneumothorax. IMPRESSION: Bronchitis. Prominence of interstitial markings in the parahilar regions and lower lung fields may suggest interstitial pneumonia. There is no focal pulmonary consolidation. There is no pleural effusion. Electronically Signed   By: PElmer PickerM.D.   On: 07/04/2021 12:17      ASSESSMENT:  End stage osteoarthritis left hip                              Active Problems:   * No active hospital problems. *     PLAN:  Left Primary Total Hip     MCarlynn Spry7/19/2023. 11:23 AM

## 2021-09-27 ENCOUNTER — Ambulatory Visit
Admission: EM | Admit: 2021-09-27 | Discharge: 2021-09-27 | Disposition: A | Discharge status: Home / Self Care | Payer: Medicare Other

## 2021-09-27 DIAGNOSIS — J302 Other seasonal allergic rhinitis: Secondary | ICD-10-CM | POA: Diagnosis not present

## 2021-09-27 DIAGNOSIS — J069 Acute upper respiratory infection, unspecified: Secondary | ICD-10-CM | POA: Diagnosis not present

## 2021-09-27 NOTE — ED Provider Notes (Signed)
[name]    CSN: [id number] Arrival date & time: 09/27/21  9371      History   Chief Complaint Chief Complaint  Patient presents with   Nasal Congestion   Otalgia    HPI Marie Clarke is a 51 y.o. female.  Patient presents with 3-4 day history of ear pressure, scratchy throat, nasal congestion, postnasal drip, mild nonproductive cough.  No fever, chills, chest pain, shortness of breath, vomiting, diarrhea, or other symptoms.  Negative COVID test at home yesterday.  Treating symptoms at home with Xyzal which she takes daily for allergies.  Her medical history includes hypertension, heart failure, diabetes, CKD, COPD, seizures, morbid obesity, chronic pain.  The history is provided by the patient and medical records.    Past Medical History:  Diagnosis Date   Anemia    Anginal pain (HCC)    Anxiety    Arthritis    Asthma    Bilateral lower extremity edema    Bipolar disorder ([place])    CAD (coronary artery disease) unk   CHF (congestive heart failure) (HCC)    Chronic pain syndrome    COPD (chronic obstructive pulmonary disease) ([place])    emphysema   DDD (degenerative disc disease), lumbar    Depression unk   Diabetes mellitus without complication ([place])    Diabetes mellitus, type II ([place])    Drug overdose 05-14-2013   after dad died   Dysrhythmia    history of tachy arrythmias   Family history of adverse reaction to anesthesia    sister had shortness of breath after surgery   Fibromyalgia    GERD (gastroesophageal reflux disease)    Headache    chronic migraines   Hyperlipidemia    Hypertension    Hypothyroidism    Left leg pain 04/29/2014   MI (myocardial infarction) ([place]) 2005/05/14   Muscle ache 09/16/2014   Osteoporosis    Overactive bladder    Pancreatitis unk   Panic attacks    PTSD (post-traumatic stress disorder)    Reflex sympathetic dystrophy    Renal cyst, left    Renal insufficiency    ckd stage iii   Restless legs syndrome    Sleep apnea 06/2019    uses cpap   Stage 3a chronic kidney disease ([place])    Stroke ([place]) May 14, 2008   TIA x 2. no residual deficits   Suicide attempt [facility])    after father died.   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   Seizure-like activity ([place]) 09/30/2020   Hyperlipidemia    COPD (chronic obstructive pulmonary disease) (HCC)    Chronic diastolic CHF (congestive heart failure) (HCC)    Pain in thoracic spine 05/02/2020   Rectal pain 04/07/2020   Partial small bowel obstruction ([place]) 03/29/2020   Adrenal nodule ([place]) 03/19/2020   Hypokalemia    SBO (small bowel obstruction) ([name]) 12/02/2019   Chronic pain 12/02/2019   Presence of neurostimulator (SCS) (June 2021) 10/06/2019   Pain due to any device, implant or graft 09/21/2019   Anxiety 09/10/2019   Involuntary movements 09/10/2019   Abnormal involuntary movement 08/18/2019   Bipolar disorder, in  full remission, most recent episode mixed ([place]) 08/11/2019   Lumbosacral radiculopathy at L5 (Right) 04/09/2019   Chronic migraine 03/12/2019   Gout 01/21/2019   Noncompliance with treatment regimen 12/29/2018   BMI 40.0-44.9, adult ([place]) 11/19/2018   AKI (acute kidney injury) ([place]) 11/17/2018   Postural dizziness with presyncope 09/29/2018   Bipolar I disorder, most recent episode mixed ([place]) 09/10/2018   Panic attacks 09/10/2018   Chronic lumbosacral L5-S1 IVD protrusion (Bilateral) 08/25/2018   Lumbar lateral recess stenosis (L5-S1) (Bilateral) 08/25/2018   Wound disruption 08/05/2018   Osteoarthritis involving multiple joints 07/10/2018   Long term current use of non-steroidal anti-inflammatories (NSAID) 07/10/2018   NSAID induced gastritis  07/10/2018   DDD (degenerative disc disease), lumbosacral 04/22/2018   Disease related peripheral neuropathy 11/13/2017   Gastroesophageal reflux disease without esophagitis 11/13/2017   Occipital headache (Bilateral) 11/13/2017   Cervicogenic headache (Bilateral) (L>R) 11/13/2017   History of postoperative nausea 10/22/2017   Chronic shoulder pain (5th area of Pain) (Bilateral) (L>R) 10/09/2017   Ankle joint instability (Left) 10/09/2017   Ankle sprain, sequela (Left) 10/09/2017   History of psychiatric symptoms 10/09/2017   Chronic pain syndrome 10/02/2017   Spondylosis without myelopathy or radiculopathy, lumbosacral region 10/02/2017   Chronic musculoskeletal pain 10/02/2017   Elevated C-reactive protein (CRP) 09/10/2017   Elevated sed rate 09/10/2017   Chronic neck pain (4th area of Pain) (Bilateral) (L>R) 09/09/2017   Pharmacologic therapy 09/09/2017   Disorder of skeletal system 09/09/2017   Problems influencing health status 09/09/2017   Long term current use of opiate analgesic 09/09/2017   Tobacco use disorder 07/16/2017   Ventral hernia without obstruction or gangrene 06/25/2017   Strain of extensor muscle, fascia and tendon of left index finger at wrist and hand level, initial encounter 05/16/2017   Sepsis ([place]) 04/14/2017   Osteopenia 04/03/2017   Hypotension 09/17/2016   Contusion of knee (Left) 10/12/2015   Strain of knee (Left) 10/12/2015   Incidental lung nodule 04/28/2015   Chronic low back pain (1ry area of Pain) (Bilateral) (L>R) 03/28/2015   Chronic lower extremity pain (Referred) (2ry area of Pain) (Left) 03/28/2015   Abdominal wound dehiscence 03/28/2015   Encounter for pain management planning 03/28/2015   Morbid obesity ([place]) 03/28/2015   Abnormal CT scan, lumbar spine 03/28/2015   Lumbar facet hypertrophy 03/28/2015   Lumbar facet syndrome (Bilateral) (L>R) 03/28/2015   Lumbar foraminal stenosis (Bilateral) (L5-S1) 03/28/2015   Chronic ankle pain (3ry  area of Pain) (Left) 03/28/2015   Neurogenic pain 03/28/2015   Neuropathic pain 03/28/2015   Myofascial pain 03/28/2015   History of suicide attempt 03/28/2015   PTSD (post-traumatic stress disorder) 01/13/2015   Abnormal gait 12/15/2014   Congestive heart failure ([place]) 11/15/2014   Abdominal wall abscess 09/20/2014   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   Diabetes mellitus, type 2 ([place]) 08/13/2014   Bipolar affective disorder ([place]) 08/13/2014   Type 2 diabetes mellitus with hyperlipidemia ([place]) 08/13/2014   Rectal prolapse 08/09/2014   Rectal bleeding 08/09/2014   Rectal bleed 08/09/2014   Affective bipolar disorder ([place]) 08/05/2014   Arteriosclerosis of coronary artery 08/05/2014   CCF (congestive cardiac failure) ([place]) 08/05/2014   Chronic kidney disease 08/05/2014   Detrusor muscle hypertonia 08/05/2014   Apnea, sleep 08/05/2014   Temporary cerebral vascular dysfunction 08/05/2014   Polypharmacy 04/29/2014   Other long term (current) drug therapy 04/29/2014   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   Chronic kidney disease, stage III (moderate) ([place]) 12/14/2013   Controlled diabetes mellitus type II  without complication ([place]) 62/70/3500   Essential (primary) hypertension 12/03/2013   Adult hypothyroidism 12/03/2013   Controlled type 2 diabetes mellitus without complication ([place]) 93/81/8299    Past Surgical History:  Procedure Laterality Date   CARDIAC CATHETERIZATION  2015   ELBOW FRACTURE SURGERY Left    pin placed   HERNIA REPAIR  07/15/2017   [facility]   HERNIA REPAIR  04/10/2021   [facility]   hyoid surgery & excision lingual tonsil  12/2020   lumbar facet     several   MELANOMA EXCISION  03/2020   OUTSIDE OF RECTUM   prolapse rectum surgery N/A 08/2014   SPINAL CORD STIMULATOR REMOVAL N/A 11/07/2020   Procedure: REMOVAL OF THORACIC SPINAL CORD STIMULATOR & PULSE GENERATOR;  Surgeon: [name], MD;  Location: [facility] ORS;  Service: Neurosurgery;  Laterality: N/A;   THORACIC  LAMINECTOMY FOR SPINAL CORD STIMULATOR N/A 07/27/2019   Procedure: THORACIC SPINAL CORD STIMULATOR PLACEMENT WITH RIGHT FLANK PULSE GENERATOR;  Surgeon: [name], MD;  Location: [facility] ORS;  Service: Neurosurgery;  Laterality: N/A;   TONSILLECTOMY     TOTAL ABDOMINAL HYSTERECTOMY W/ BILATERAL SALPINGOOPHORECTOMY  2005   TOTAL HIP ARTHROPLASTY Left 08/07/2021   Procedure: TOTAL HIP ARTHROPLASTY ANTERIOR APPROACH;  Surgeon: Lovell Sheehan, MD;  Location: [facility] ORS;  Service: Orthopedics;  Laterality: Left;    OB History     Gravida  0   Para  0   Term  0   Preterm  0   AB  0   Living  0      SAB  0   IAB  0   Ectopic  0   Multiple  0   Live Births  0        Obstetric Comments  Menstrual age: 72  Age 1st Pregnancy:           Home Medications    Prior to Admission medications   Medication Sig Start Date End Date Taking? Authorizing Provider  albuterol (PROVENTIL) (2.5 MG/3ML) 0.083% nebulizer solution Take 2.5 mg by nebulization every 6 (six) hours as needed for wheezing or shortness of breath.    [provider]  aspirin 81 MG chewable tablet Chew 1 tablet (81 mg total) by mouth 2 (two) times daily. 08/09/21   Carlynn Spry, PA-C  atorvastatin (LIPITOR) 40 MG tablet Take 40 mg by mouth at bedtime. 04/05/20   [provider]  cariprazine (VRAYLAR) 3 MG capsule Take 3 mg by mouth at bedtime.    [provider]  cholecalciferol (VITAMIN D3) 25 MCG (1000 UNIT) tablet Take 1,000 Units by mouth daily.    [provider]  clonazepam (KLONOPIN) 0.125 MG disintegrating tablet Take 0.125 mg by mouth 3 (three) times daily. 08/25/20   [provider]  cyanocobalamin 1000 MCG tablet Take 1,000 mcg by mouth daily.    [provider]  diclofenac (VOLTAREN) 75 MG EC tablet Take 75 mg by mouth 2 (two) times daily.    [provider]  dicyclomine (BENTYL) 10 MG capsule Take 10 mg by mouth 3 (three) times daily as  needed for spasms.    [provider]  docusate sodium (COLACE) 100 MG capsule Take 1 capsule (100 mg total) by mouth 2 (two) times daily. 08/09/21   Carlynn Spry, PA-C  empagliflozin (JARDIANCE) 10 MG TABS tablet Take 10 mg by mouth daily.    [provider]  esomeprazole (NEXIUM) 40 MG capsule Take 40 mg by mouth at bedtime.  [provider]  febuxostat (ULORIC) 40 MG tablet Take 40 mg by mouth daily.    [provider]  furosemide (LASIX) 80 MG tablet Take 80 mg by mouth 2 (two) times daily. 09/08/20   [provider]  gabapentin (NEURONTIN) 600 MG tablet Take 600 mg by mouth 3 (three) times daily.    [provider]  HYDROcodone-acetaminophen (NORCO) 7.5-325 MG tablet Take 1-2 tablets by mouth every 4 (four) hours as needed for severe pain or moderate pain (pain score 7-10). 08/09/21   Carlynn Spry, PA-C  levocetirizine (XYZAL) 5 MG tablet Take 5 mg by mouth every evening. 10/18/20   [provider]  levothyroxine (SYNTHROID) 200 MCG tablet Take 200 mcg by mouth daily before breakfast. 1 tablet everyday except for Sunday; only take 1/2 tablet (100 mcg) on Sunday.    [provider]  metoprolol succinate (TOPROL-XL) 50 MG 24 hr tablet Take 75 mg by mouth 2 (two) times daily.    [provider]  montelukast (SINGULAIR) 10 MG tablet Take 10 mg by mouth at bedtime.    [provider]  Multiple Vitamin (MULTIVITAMIN) tablet Take 1 tablet by mouth daily.    [provider]  Multiple Vitamins-Iron (MULTIPLE VITAMIN/IRON PO) Take 1 tablet by mouth daily.    [provider]  nitroGLYCERIN (NITROLINGUAL) 0.4 MG/SPRAY spray Place 1 spray under the tongue as needed for chest pain. 11/02/20 11/02/21  [provider]  Omega-3 Fatty Acids (FISH OIL) 1000 MG CAPS Take 1 capsule by mouth daily.    [provider]  ondansetron (ZOFRAN-ODT) 4 MG disintegrating tablet Take 4 mg by mouth every  8 (eight) hours as needed for nausea or vomiting.    [provider]  potassium chloride SA (KLOR-CON) 20 MEQ tablet Take 40 mEq by mouth 2 (two) times daily. 12/25/19   [provider]  PROAIR HFA 108 (90 Base) MCG/ACT inhaler Inhale 2 puffs into the lungs every 6 (six) hours as needed for wheezing or shortness of breath.    [provider]  rizatriptan (MAXALT) 10 MG tablet Take 10 mg by mouth as needed for migraine. 10/19/20   [provider]  rOPINIRole (REQUIP) 1 MG tablet Take 1 mg by mouth 3 (three) times daily. 12/10/19   [provider]  solifenacin (VESICARE) 5 MG tablet Take 5 mg by mouth daily.    [provider]  SYMBICORT 160-4.5 MCG/ACT inhaler Inhale 2 puffs into the lungs 2 (two) times daily.    [provider]  temazepam (RESTORIL) 7.5 MG capsule Take 7.5 mg by mouth at bedtime as needed for sleep.    [provider]  topiramate (TOPAMAX) 50 MG tablet Take 50 mg by mouth 2 (two) times daily.    [provider]  vitamin C (ASCORBIC ACID) 500 MG tablet Take 500 mg by mouth daily.    [provider]  vitamin E 180 MG (400 UNITS) capsule Take 400 Units by mouth daily.    [provider]    Family History Family History  Problem Relation Age of Onset   Diabetes Mellitus II Mother    CAD Mother    Sleep apnea Mother    Osteoarthritis Mother    Osteoporosis Mother    Anxiety disorder Mother    Depression Mother    Bipolar disorder Mother    Bipolar disorder Father    Hypertension Father    Depression Father    Anxiety disorder Father  Post-traumatic stress disorder Sister     Social History Social History   Tobacco Use   Smoking status: Former    Packs/day: 0.50    Types: Cigarettes    Quit date: 09/01/2018    Years since quitting: 3.0   Smokeless tobacco: Never   Tobacco comments:    Patient quit smoking 09/01/2018  Vaping Use   Vaping Use: Never used  Substance  Use Topics   Alcohol use: No    Alcohol/week: 0.0 standard drinks of alcohol   Drug use: No     Allergies   Diazepam, Ziprasidone hcl, Azithromycin, Divalproex sodium, Levofloxacin, Lisinopril, Metronidazole, Sulfa antibiotics, Valproic acid, Cephalexin, Ciprofloxacin, Doxycycline, and Penicillins   Review of Systems Review of Systems  Constitutional:  Negative for chills and fever.  HENT:  Positive for congestion, ear pain, postnasal drip, rhinorrhea and sore throat.   Respiratory:  Positive for cough. Negative for shortness of breath.   Cardiovascular:  Negative for chest pain and palpitations.  Gastrointestinal:  Negative for diarrhea and vomiting.  Skin:  Negative for color change and rash.  All other systems reviewed and are negative.    Physical Exam Triage Vital Signs ED Triage Vitals [09/27/21 0831]  Enc Vitals Group     BP 114/80     Pulse Rate (!) 111     Resp 18     Temp 98.2 F (36.8 C)     Temp src      SpO2      Weight      Height      Head Circumference      Peak Flow      Pain Score      Pain Loc      Pain Edu?      Excl. in [place]?    No data found.  Updated Vital Signs BP 114/80   Pulse (!) 111   Temp 98.2 F (36.8 C)   Resp 18   LMP  (LMP Unknown)   Visual Acuity Right Eye Distance:   Left Eye Distance:   Bilateral Distance:    Right Eye Near:   Left Eye Near:    Bilateral Near:     Physical Exam Vitals and nursing note reviewed.  Constitutional:      General: She is not in acute distress.    Appearance: She is well-developed. She is not ill-appearing.  HENT:     Right Ear: Tympanic membrane normal.     Left Ear: Tympanic membrane normal.     Nose: Nose normal.     Mouth/Throat:     Mouth: Mucous membranes are moist.     Pharynx: Oropharynx is clear.  Cardiovascular:     Rate and Rhythm: Normal rate and regular rhythm.     Heart sounds: Normal heart sounds.  Pulmonary:     Effort: Pulmonary effort is normal. No respiratory  distress.     Breath sounds: Normal breath sounds.  Musculoskeletal:     Cervical back: Neck supple.  Skin:    General: Skin is warm and dry.  Neurological:     Mental Status: She is alert.  Psychiatric:        Mood and Affect: Mood normal.        Behavior: Behavior normal.      UC Treatments / Results  Labs (all labs ordered are listed, but only abnormal results are displayed) Labs Reviewed - No data to display  EKG   Radiology No results found.  Procedures Procedures (including critical care time)  Medications Ordered in UC Medications - No data to display  Initial Impression / Assessment and Plan / UC Course  I have reviewed the triage vital signs and the nursing notes.  Pertinent labs & imaging results that were available during my care of the patient were reviewed by me and considered in my medical decision making (see chart for details).   Viral URI, seasonal allergies.  Patient declines COVID test.  She declines Tessalon for cough.  Instructed her to continue symptomatic treatment including allergy medication.  Education provided on viral URI and seasonal allergies.  Instructed patient to follow up with her PCP if her symptoms are not improving.  She agrees to plan of care.    Final Clinical Impressions(s) / UC Diagnoses   Final diagnoses:  Viral URI  Seasonal allergies     Discharge Instructions      Continue symptomatic care with allergy medication.  Follow up with your primary care provider if your symptoms are not improving.        ED Prescriptions   None    PDMP not reviewed this encounter.   Sharion Balloon, NP 09/27/21 [phone]

## 2021-09-27 NOTE — Discharge Instructions (Addendum)
Continue symptomatic care with allergy medication.  Follow up with your primary care provider if your symptoms are not improving.

## 2021-09-27 NOTE — ED Triage Notes (Signed)
Pt presents with nasal congestion and ear pain for past 4 days, covid test negative

## 2021-10-19 ENCOUNTER — Ambulatory Visit: Payer: Medicare Other | Attending: Specialist

## 2021-10-19 DIAGNOSIS — G4733 Obstructive sleep apnea (adult) (pediatric): Secondary | ICD-10-CM | POA: Diagnosis present

## 2021-10-27 ENCOUNTER — Emergency Department: Payer: Medicare Other

## 2021-10-27 ENCOUNTER — Emergency Department
Admission: EM | Admit: 2021-10-27 | Discharge: 2021-10-27 | Disposition: A | Discharge status: Home / Self Care | Payer: Medicare Other | Attending: Emergency Medicine | Admitting: Emergency Medicine

## 2021-10-27 ENCOUNTER — Other Ambulatory Visit: Payer: Self-pay

## 2021-10-27 DIAGNOSIS — R42 Dizziness and giddiness: Secondary | ICD-10-CM | POA: Diagnosis not present

## 2021-10-27 DIAGNOSIS — R0602 Shortness of breath: Secondary | ICD-10-CM | POA: Diagnosis present

## 2021-10-27 DIAGNOSIS — Z20822 Contact with and (suspected) exposure to covid-19: Secondary | ICD-10-CM | POA: Diagnosis not present

## 2021-10-27 DIAGNOSIS — J441 Chronic obstructive pulmonary disease with (acute) exacerbation: Secondary | ICD-10-CM | POA: Diagnosis not present

## 2021-10-27 DIAGNOSIS — R110 Nausea: Secondary | ICD-10-CM | POA: Insufficient documentation

## 2021-10-27 LAB — URINALYSIS, ROUTINE W REFLEX MICROSCOPIC
Bilirubin Urine: NEGATIVE
Glucose, UA: >=500 mg/dL — AB
Hgb urine dipstick: NEGATIVE
Ketones, ur: NEGATIVE mg/dL
Nitrite: NEGATIVE
Protein, ur: NEGATIVE mg/dL
Specific Gravity, Urine: 1.006 (ref 1.005–1.030)
pH: 5 (ref 5.0–8.0)

## 2021-10-27 LAB — CBC WITH DIFFERENTIAL/PLATELET
Abs Immature Granulocytes: 0.06 10*3/uL (ref 0.00–0.07)
Basophils Absolute: 0 10*3/uL (ref 0.0–0.1)
Basophils Relative: 0 %
Eosinophils Absolute: 0 10*3/uL (ref 0.0–0.5)
Eosinophils Relative: 0 %
HCT: 38 % (ref 36.0–46.0)
Hemoglobin: 11.9 g/dL — ABNORMAL LOW (ref 12.0–15.0)
Immature Granulocytes: 1 %
Lymphocytes Relative: 20 %
Lymphs Abs: 1.7 10*3/uL (ref 0.7–4.0)
MCH: 27.5 pg (ref 26.0–34.0)
MCHC: 31.3 g/dL (ref 30.0–36.0)
MCV: 88 fL (ref 80.0–100.0)
Monocytes Absolute: 0.5 10*3/uL (ref 0.1–1.0)
Monocytes Relative: 6 %
Neutro Abs: 6.3 10*3/uL (ref 1.7–7.7)
Neutrophils Relative %: 73 %
Platelets: 301 10*3/uL (ref 150–400)
RBC: 4.32 MIL/uL (ref 3.87–5.11)
RDW: 14.9 % (ref 11.5–15.5)
WBC: 8.4 10*3/uL (ref 4.0–10.5)
nRBC: 0 % (ref 0.0–0.2)

## 2021-10-27 LAB — COMPREHENSIVE METABOLIC PANEL
ALT: 17 U/L (ref 0–44)
AST: 19 U/L (ref 15–41)
Albumin: 4.4 g/dL (ref 3.5–5.0)
Alkaline Phosphatase: 142 U/L — ABNORMAL HIGH (ref 38–126)
Anion gap: 11 (ref 5–15)
BUN: 27 mg/dL — ABNORMAL HIGH (ref 6–20)
CO2: 23 mmol/L (ref 22–32)
Calcium: 8.9 mg/dL (ref 8.9–10.3)
Chloride: 104 mmol/L (ref 98–111)
Creatinine, Ser: 0.96 mg/dL (ref 0.44–1.00)
GFR, Estimated: >60 mL/min (ref >60)
Glucose, Bld: 162 mg/dL — ABNORMAL HIGH (ref 70–99)
Potassium: 3.6 mmol/L (ref 3.5–5.1)
Sodium: 138 mmol/L (ref 135–145)
Total Bilirubin: 0.4 mg/dL (ref 0.3–1.2)
Total Protein: 7.7 g/dL (ref 6.5–8.1)

## 2021-10-27 LAB — TROPONIN I (HIGH SENSITIVITY)
Troponin I (High Sensitivity): 3 ng/L (ref <18)
Troponin I (High Sensitivity): 4 ng/L (ref <18)

## 2021-10-27 LAB — SARS CORONAVIRUS 2 BY RT PCR: SARS Coronavirus 2 by RT PCR: NEGATIVE

## 2021-10-27 MED ORDER — MECLIZINE HCL 25 MG PO TABS: 25.0000 mg | ORAL_TABLET | Freq: Three times a day (TID) | ORAL | 0 refills | Status: AC | PRN

## 2021-10-27 MED ORDER — MECLIZINE HCL 25 MG PO TABS
25.0000 mg | ORAL_TABLET | Freq: Once | ORAL | Status: AC
  Administered 2021-10-27: 25 mg via ORAL
  Filled 2021-10-27: qty 1

## 2021-10-27 MED ORDER — IPRATROPIUM-ALBUTEROL 0.5-2.5 (3) MG/3ML IN SOLN
3.0000 mL | Freq: Once | RESPIRATORY_TRACT | Status: AC
  Administered 2021-10-27: 3 mL via RESPIRATORY_TRACT
  Filled 2021-10-27: qty 3

## 2021-10-27 NOTE — ED Triage Notes (Addendum)
Pt sent by [facility] for near syncope- pt c/o SHOB, dizziness, vertigo, headache, nausea, shob, and generalized pain x3 days. Pt states she is eating and drinking. Pt denies CP. Pt is AOX4, in NAD. Respirations even and unlabored.

## 2021-10-27 NOTE — Discharge Instructions (Signed)
Please seek medical attention for any high fevers, chest pain, shortness of breath, change in behavior, persistent vomiting, bloody stool or any other new or concerning symptoms.  

## 2021-10-27 NOTE — ED Triage Notes (Signed)
Patient brought over from [facility] for near syncope.   HX Stage 3 kidney disease.

## 2021-10-27 NOTE — ED Provider Notes (Signed)
[facility] Provider Note    Event Date/Time   First MD Initiated Contact with Patient 10/27/21 1252     (approximate)   History   Shortness of Breath   HPI  Marie Clarke is a 51 y.o. female who complains of vertigo starting about 3 days ago.  It is worse with movement or sitting up or standing.  She also reports some shortness of breath.  She says this is fairly mild and these like her COPD.  She told the triage nurse she had a headache and some nausea but did not tell me this at all.  The vertigo is her main problem.       Physical Exam   Triage Vital Signs: ED Triage Vitals  Enc Vitals Group     BP 10/27/21 1121 113/87     Pulse Rate 10/27/21 1121 96     Resp 10/27/21 1121 18     Temp 10/27/21 1121 98.1 F (36.7 C)     Temp src --      SpO2 10/27/21 1121 96 %     Weight 10/27/21 1123 208 lb (94.3 kg)     Height 10/27/21 1123 '5\' 4"'$  (1.626 m)     Head Circumference --      Peak Flow --      Pain Score 10/27/21 1123 10     Pain Loc --      Pain Edu? --      Excl. in [place]? --     Most recent vital signs: Vitals:   10/27/21 1430 10/27/21 1500  BP: 100/67 111/62  Pulse: 88 89  Resp:  16  Temp:  98.6 F (37 C)  SpO2: 100% 100%     General: Awake, no distress.  Head normocephalic atraumatic Eyes pupils equal round reactive extraocular movements intact fundi are difficult to see because patient complains of light sensitivity but appear normal I do not see any nystagmus Mouth no erythema or exudate CV:  Good peripheral perfusion.  Heart regular rate and rhythm no audible murmurs Resp:  Normal effort.  Lungs are clear Abd:  No distention.  Soft and nontender Extremities: No edema Neuro exam cranial nerves II through XII are intact although visual fields were not checked cerebellar finger-nose and rapid alternating movements and hands are normal motor strength is 5/5 throughout patient does not have any numbness.   ED Results / Procedures /  Treatments   Labs (all labs ordered are listed, but only abnormal results are displayed) Labs Reviewed  URINALYSIS, ROUTINE W REFLEX MICROSCOPIC - Abnormal; Notable for the following components:      Result Value   Color, Urine STRAW (*)    APPearance HAZY (*)    Glucose, UA >=500 (*)    Leukocytes,Ua MODERATE (*)    Bacteria, UA RARE (*)    All other components within normal limits  CBC WITH DIFFERENTIAL/PLATELET - Abnormal; Notable for the following components:   Hemoglobin 11.9 (*)    All other components within normal limits  COMPREHENSIVE METABOLIC PANEL - Abnormal; Notable for the following components:   Glucose, Bld 162 (*)    BUN 27 (*)    Alkaline Phosphatase 142 (*)    All other components within normal limits  TROPONIN I (HIGH SENSITIVITY)  TROPONIN I (HIGH SENSITIVITY)     EKG  EKG read and interpreted by me shows sinus tachycardia rate of 101 slight left axis decreased R wave progression in the precordial leads otherwise  no acute changes there is a Q in lead III and inverted T in lead III but no S in lead I.   RADIOLOGY Chest x-ray read by radiology reviewed by me and interpreted by me shows no acute disease  PROCEDURES:  Critical Care performed:   Procedures   MEDICATIONS ORDERED IN ED: Medications  ipratropium-albuterol (DUONEB) 0.5-2.5 (3) MG/3ML nebulizer solution 3 mL (3 mLs Nebulization Given 10/27/21 1330)  meclizine (ANTIVERT) tablet 25 mg (25 mg Oral Given 10/27/21 1330)     IMPRESSION / MDM / ASSESSMENT AND PLAN / ED COURSE  I reviewed the triage vital signs and the nursing notes. ----------------------------------------- 3:15 PM on 10/27/2021 ----------------------------------------- Patient shortness of breath is gone per her report after the breathing treatment.  Patient is waiting for the MRI.  She has had her Antivert.  We will have to see what the MRI looks like.  I have signed the patient out to oncoming physician.  Differential  diagnosis includes labyrinthitis or other inner ear problem or TIA/CVA in the posterior circulation or tumor.  Patient does have some latency to her vertigo.  I cannot repeat the testing often enough to get it to fatigue however.  Patient will not let me.  Thrust testing is indeterminant.  Patient's presentation is most consistent with acute presentation with potential threat to life or bodily function.  The patient is on the cardiac monitor to evaluate for evidence of arrhythmia and/or significant heart rate changes.  None have been seen      FINAL CLINICAL IMPRESSION(S) / ED DIAGNOSES   Final diagnoses:  COPD exacerbation ([place])  Vertigo     Rx / DC Orders   ED Discharge Orders     None        Note:  This document was prepared using Dragon voice recognition software and may include unintentional dictation errors.   Nena Polio, MD 10/27/21 [phone]

## 2021-12-11 ENCOUNTER — Encounter (INDEPENDENT_AMBULATORY_CARE_PROVIDER_SITE_OTHER): Payer: Self-pay

## 2022-01-26 ENCOUNTER — Ambulatory Visit
Admission: EM | Admit: 2022-01-26 | Discharge: 2022-01-26 | Disposition: A | Discharge status: Home / Self Care | Payer: Medicare Other | Attending: Emergency Medicine | Admitting: Emergency Medicine

## 2022-01-26 DIAGNOSIS — Z87891 Personal history of nicotine dependence: Secondary | ICD-10-CM | POA: Diagnosis not present

## 2022-01-26 DIAGNOSIS — E1122 Type 2 diabetes mellitus with diabetic chronic kidney disease: Secondary | ICD-10-CM | POA: Insufficient documentation

## 2022-01-26 DIAGNOSIS — B349 Viral infection, unspecified: Secondary | ICD-10-CM | POA: Diagnosis not present

## 2022-01-26 DIAGNOSIS — N1831 Chronic kidney disease, stage 3a: Secondary | ICD-10-CM | POA: Diagnosis not present

## 2022-01-26 DIAGNOSIS — U071 COVID-19: Secondary | ICD-10-CM | POA: Diagnosis not present

## 2022-01-26 DIAGNOSIS — I130 Hypertensive heart and chronic kidney disease with heart failure and stage 1 through stage 4 chronic kidney disease, or unspecified chronic kidney disease: Secondary | ICD-10-CM | POA: Insufficient documentation

## 2022-01-26 DIAGNOSIS — I5032 Chronic diastolic (congestive) heart failure: Secondary | ICD-10-CM | POA: Diagnosis not present

## 2022-01-26 DIAGNOSIS — Z7984 Long term (current) use of oral hypoglycemic drugs: Secondary | ICD-10-CM | POA: Diagnosis not present

## 2022-01-26 DIAGNOSIS — J029 Acute pharyngitis, unspecified: Secondary | ICD-10-CM | POA: Diagnosis present

## 2022-01-26 DIAGNOSIS — Z8249 Family history of ischemic heart disease and other diseases of the circulatory system: Secondary | ICD-10-CM | POA: Diagnosis not present

## 2022-01-26 DIAGNOSIS — R6883 Chills (without fever): Secondary | ICD-10-CM | POA: Insufficient documentation

## 2022-01-26 DIAGNOSIS — G8929 Other chronic pain: Secondary | ICD-10-CM | POA: Diagnosis not present

## 2022-01-26 DIAGNOSIS — J449 Chronic obstructive pulmonary disease, unspecified: Secondary | ICD-10-CM | POA: Diagnosis not present

## 2022-01-26 DIAGNOSIS — Z8673 Personal history of transient ischemic attack (TIA), and cerebral infarction without residual deficits: Secondary | ICD-10-CM | POA: Insufficient documentation

## 2022-01-26 LAB — RESP PANEL BY RT-PCR (FLU A&B, COVID) ARPGX2
Influenza A by PCR: NEGATIVE
Influenza B by PCR: NEGATIVE
SARS Coronavirus 2 by RT PCR: POSITIVE — AB

## 2022-01-26 NOTE — ED Triage Notes (Signed)
Patient to Urgent Care with complaints of sore throat, ear ache, and generalized body aches and chills. Temp yesterday 99.8  Symptoms started yesterday.  Has been rotating ibuprofen and tylenol. Last dose 6am this morning.

## 2022-01-26 NOTE — ED Provider Notes (Signed)
[name]    CSN: [id number] Arrival date & time: 01/26/22  0810      History   Chief Complaint Chief Complaint  Patient presents with   Sore Throat    HPI Marie Clarke is a 51 y.o. female.  Patient presents with chills, body aches, earache, sore throat, mild nonproductive cough since yesterday.  Treatment at home with Tylenol and ibuprofen; last dose of Tylenol taken at 0600 this morning.  She denies rash, chest pain, shortness of breath, vomiting, diarrhea, or other symptoms.  Her medical history includes COPD, hypertension, diabetes, CKD, seizures, stroke, bipolar disorder, chronic pain.    The history is provided by the patient and medical records.    Past Medical History:  Diagnosis Date   Anemia    Anginal pain (HCC)    Anxiety    Arthritis    Asthma    Bilateral lower extremity edema    Bipolar disorder ([place])    CAD (coronary artery disease) unk   CHF (congestive heart failure) (HCC)    Chronic pain syndrome    COPD (chronic obstructive pulmonary disease) ([place])    emphysema   DDD (degenerative disc disease), lumbar    Depression unk   Diabetes mellitus without complication ([place])    Diabetes mellitus, type II ([place])    Drug overdose 05-07-2013   after dad died   Dysrhythmia    history of tachy arrythmias   Family history of adverse reaction to anesthesia    sister had shortness of breath after surgery   Fibromyalgia    GERD (gastroesophageal reflux disease)    Headache    chronic migraines   Hyperlipidemia    Hypertension    Hypothyroidism    Left leg pain 04/29/2014   MI (myocardial infarction) ([place]) 05/07/2005   Muscle ache 09/16/2014   Osteoporosis    Overactive bladder    Pancreatitis unk   Panic attacks    PTSD (post-traumatic stress disorder)    Reflex sympathetic dystrophy    Renal cyst, left    Renal insufficiency    ckd stage iii   Restless legs syndrome    Sleep apnea 06/2019   uses cpap   Stage 3a chronic kidney disease ([place])     Stroke ([place]) 2008/05/07   TIA x 2. no residual deficits   Suicide attempt [facility])    after father died.   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   Seizure-like activity ([place]) 09/30/2020   Hyperlipidemia    COPD (chronic obstructive pulmonary disease) (HCC)    Chronic diastolic CHF (congestive heart failure) ([place])    Pain in thoracic spine 05/02/2020   Rectal pain 04/07/2020   Partial small bowel obstruction ([place]) 03/29/2020   Adrenal nodule ([place]) 03/19/2020   Hypokalemia    SBO (small bowel obstruction) ([place]) 12/02/2019   Chronic pain 12/02/2019   Presence of neurostimulator (SCS) (June 2021) 10/06/2019   Pain due to any device, implant or graft 09/21/2019   Anxiety 09/10/2019   Involuntary movements 09/10/2019   Abnormal involuntary movement 08/18/2019   Bipolar disorder, in full remission, most recent episode  mixed ([place]) 08/11/2019   Lumbosacral radiculopathy at L5 (Right) 04/09/2019   Chronic migraine 03/12/2019   Gout 01/21/2019   Noncompliance with treatment regimen 12/29/2018   BMI 40.0-44.9, adult ([place]) 11/19/2018   AKI (acute kidney injury) ([place]) 11/17/2018   Postural dizziness with presyncope 09/29/2018   Bipolar I disorder, most recent episode mixed ([place]) 09/10/2018   Panic attacks 09/10/2018   Chronic lumbosacral L5-S1 IVD protrusion (Bilateral) 08/25/2018   Lumbar lateral recess stenosis (L5-S1) (Bilateral) 08/25/2018   Wound disruption 08/05/2018   Osteoarthritis involving multiple joints 07/10/2018   Long term current use of non-steroidal anti-inflammatories (NSAID) 07/10/2018   NSAID induced gastritis 07/10/2018   DDD (degenerative disc  disease), lumbosacral 04/22/2018   Disease related peripheral neuropathy 11/13/2017   Gastroesophageal reflux disease without esophagitis 11/13/2017   Occipital headache (Bilateral) 11/13/2017   Cervicogenic headache (Bilateral) (L>R) 11/13/2017   History of postoperative nausea 10/22/2017   Chronic shoulder pain (5th area of Pain) (Bilateral) (L>R) 10/09/2017   Ankle joint instability (Left) 10/09/2017   Ankle sprain, sequela (Left) 10/09/2017   History of psychiatric symptoms 10/09/2017   Chronic pain syndrome 10/02/2017   Spondylosis without myelopathy or radiculopathy, lumbosacral region 10/02/2017   Chronic musculoskeletal pain 10/02/2017   Elevated C-reactive protein (CRP) 09/10/2017   Elevated sed rate 09/10/2017   Chronic neck pain (4th area of Pain) (Bilateral) (L>R) 09/09/2017   Pharmacologic therapy 09/09/2017   Disorder of skeletal system 09/09/2017   Problems influencing health status 09/09/2017   Long term current use of opiate analgesic 09/09/2017   Tobacco use disorder 07/16/2017   Ventral hernia without obstruction or gangrene 06/25/2017   Strain of extensor muscle, fascia and tendon of left index finger at wrist and hand level, initial encounter 05/16/2017   Sepsis ([place]) 04/14/2017   Osteopenia 04/03/2017   Hypotension 09/17/2016   Contusion of knee (Left) 10/12/2015   Strain of knee (Left) 10/12/2015   Incidental lung nodule 04/28/2015   Chronic low back pain (1ry area of Pain) (Bilateral) (L>R) 03/28/2015   Chronic lower extremity pain (Referred) (2ry area of Pain) (Left) 03/28/2015   Abdominal wound dehiscence 03/28/2015   Encounter for pain management planning 03/28/2015   Morbid obesity ([place]) 03/28/2015   Abnormal CT scan, lumbar spine 03/28/2015   Lumbar facet hypertrophy 03/28/2015   Lumbar facet syndrome (Bilateral) (L>R) 03/28/2015   Lumbar foraminal stenosis (Bilateral) (L5-S1) 03/28/2015   Chronic ankle pain (3ry area of Pain) (Left) 03/28/2015    Neurogenic pain 03/28/2015   Neuropathic pain 03/28/2015   Myofascial pain 03/28/2015   History of suicide attempt 03/28/2015   PTSD (post-traumatic stress disorder) 01/13/2015   Abnormal gait 12/15/2014   Congestive heart failure ([place]) 11/15/2014   Abdominal wall abscess 09/20/2014   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   Diabetes mellitus, type 2 ([place]) 08/13/2014   Bipolar affective disorder ([place]) 08/13/2014   Type 2 diabetes mellitus with hyperlipidemia ([place]) 08/13/2014   Rectal prolapse 08/09/2014   Rectal bleeding 08/09/2014   Rectal bleed 08/09/2014   Affective bipolar disorder ([place]) 08/05/2014   Arteriosclerosis of coronary artery 08/05/2014   CCF (congestive cardiac failure) ([place]) 08/05/2014   Chronic kidney disease 08/05/2014   Detrusor muscle hypertonia 08/05/2014   Apnea, sleep 08/05/2014   Temporary cerebral vascular dysfunction 08/05/2014   Polypharmacy 04/29/2014   Other long term (current) drug therapy 04/29/2014   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   Chronic kidney disease, stage III (moderate) ([place]) 12/14/2013   Controlled diabetes mellitus type II without complication ([place]) 81/27/5170  Essential (primary) hypertension 12/03/2013   Adult hypothyroidism 12/03/2013   Controlled type 2 diabetes mellitus without complication ([place]) 93/26/7124    Past Surgical History:  Procedure Laterality Date   CARDIAC CATHETERIZATION  2015   ELBOW FRACTURE SURGERY Left    pin placed   HERNIA REPAIR  07/15/2017   [facility]   HERNIA REPAIR  04/10/2021   [facility]   hyoid surgery & excision lingual tonsil  12/2020   lumbar facet     several   MELANOMA EXCISION  03/2020   OUTSIDE OF RECTUM   prolapse rectum surgery N/A 08/2014   SPINAL CORD STIMULATOR REMOVAL N/A 11/07/2020   Procedure: REMOVAL OF THORACIC SPINAL CORD STIMULATOR & PULSE GENERATOR;  Surgeon: [name], MD;  Location: [facility] ORS;  Service: Neurosurgery;  Laterality: N/A;   THORACIC LAMINECTOMY FOR SPINAL CORD STIMULATOR N/A  07/27/2019   Procedure: THORACIC SPINAL CORD STIMULATOR PLACEMENT WITH RIGHT FLANK PULSE GENERATOR;  Surgeon: [name], MD;  Location: [facility] ORS;  Service: Neurosurgery;  Laterality: N/A;   TONSILLECTOMY     TOTAL ABDOMINAL HYSTERECTOMY W/ BILATERAL SALPINGOOPHORECTOMY  2005   TOTAL HIP ARTHROPLASTY Left 08/07/2021   Procedure: TOTAL HIP ARTHROPLASTY ANTERIOR APPROACH;  Surgeon: Lovell Sheehan, MD;  Location: [facility] ORS;  Service: Orthopedics;  Laterality: Left;    OB History     Gravida  0   Para  0   Term  0   Preterm  0   AB  0   Living  0      SAB  0   IAB  0   Ectopic  0   Multiple  0   Live Births  0        Obstetric Comments  Menstrual age: 30  Age 1st Pregnancy:           Home Medications    Prior to Admission medications   Medication Sig Start Date End Date Taking? Authorizing Provider  albuterol (PROVENTIL) (2.5 MG/3ML) 0.083% nebulizer solution Take 2.5 mg by nebulization every 6 (six) hours as needed for wheezing or shortness of breath.    [provider]  aspirin 81 MG chewable tablet Chew 1 tablet (81 mg total) by mouth 2 (two) times daily. 08/09/21   Carlynn Spry, PA-C  atorvastatin (LIPITOR) 40 MG tablet Take 40 mg by mouth at bedtime. 04/05/20   [provider]  cariprazine (VRAYLAR) 3 MG capsule Take 3 mg by mouth at bedtime.    [provider]  cholecalciferol (VITAMIN D3) 25 MCG (1000 UNIT) tablet Take 1,000 Units by mouth daily.    [provider]  clonazepam (KLONOPIN) 0.125 MG disintegrating tablet Take 0.125 mg by mouth 3 (three) times daily. 08/25/20   [provider]  cyanocobalamin 1000 MCG tablet Take 1,000 mcg by mouth daily.    [provider]  diclofenac (VOLTAREN) 75 MG EC tablet Take 75 mg by mouth 2 (two) times daily.    [provider]  dicyclomine (BENTYL) 10 MG capsule Take 10 mg by mouth 3 (three) times daily as needed for spasms.    [provider]   docusate sodium (COLACE) 100 MG capsule Take 1 capsule (100 mg total) by mouth 2 (two) times daily. 08/09/21   Carlynn Spry, PA-C  empagliflozin (JARDIANCE) 10 MG TABS tablet Take 10 mg by mouth daily.    [provider]  esomeprazole (NEXIUM) 40 MG capsule Take 40 mg by mouth at bedtime.    [provider]  febuxostat (ULORIC) 40 MG tablet Take 40 mg by mouth daily.    [provider]  furosemide (LASIX) 80 MG tablet Take 80 mg by mouth 2 (two) times daily. 09/08/20   [provider]  gabapentin (NEURONTIN) 600 MG tablet Take 600 mg by mouth 3 (three) times daily.    [provider]  HYDROcodone-acetaminophen (NORCO) 7.5-325 MG tablet Take 1-2 tablets by mouth every 4 (four) hours as needed for severe pain or moderate pain (pain score 7-10). 08/09/21   Carlynn Spry, PA-C  levocetirizine (XYZAL) 5 MG tablet Take 5 mg by mouth every evening. 10/18/20   [provider]  levothyroxine (SYNTHROID) 200 MCG tablet Take 200 mcg by mouth daily before breakfast. 1 tablet everyday except for Sunday; only take 1/2 tablet (100 mcg) on Sunday.    [provider]  meclizine (ANTIVERT) 25 MG tablet Take 1 tablet (25 mg total) by mouth 3 (three) times daily as needed for dizziness. 10/27/21   Nance Pear, MD  metoprolol succinate (TOPROL-XL) 50 MG 24 hr tablet Take 75 mg by mouth 2 (two) times daily.    [provider]  montelukast (SINGULAIR) 10 MG tablet Take 10 mg by mouth at bedtime.    [provider]  Multiple Vitamin (MULTIVITAMIN) tablet Take 1 tablet by mouth daily.    [provider]  Multiple Vitamins-Iron (MULTIPLE VITAMIN/IRON PO) Take 1 tablet by mouth daily.    [provider]  Omega-3 Fatty Acids (FISH OIL) 1000 MG CAPS Take 1 capsule by mouth daily.    [provider]  ondansetron (ZOFRAN-ODT) 4 MG disintegrating tablet Take 4 mg by mouth every 8 (eight) hours as needed for nausea or  vomiting.    [provider]  potassium chloride SA (KLOR-CON) 20 MEQ tablet Take 40 mEq by mouth 2 (two) times daily. 12/25/19   [provider]  PROAIR HFA 108 (90 Base) MCG/ACT inhaler Inhale 2 puffs into the lungs every 6 (six) hours as needed for wheezing or shortness of breath.    [provider]  rizatriptan (MAXALT) 10 MG tablet Take 10 mg by mouth as needed for migraine. 10/19/20   [provider]  rOPINIRole (REQUIP) 1 MG tablet Take 1 mg by mouth 3 (three) times daily. 12/10/19   [provider]  solifenacin (VESICARE) 5 MG tablet Take 5 mg by mouth daily.    [provider]  SYMBICORT 160-4.5 MCG/ACT inhaler Inhale 2 puffs into the lungs 2 (two) times daily.    [provider]  temazepam (RESTORIL) 7.5 MG capsule Take 7.5 mg by mouth at bedtime as needed for sleep.    [provider]  topiramate (TOPAMAX) 50 MG tablet Take 50 mg by mouth 2 (two) times daily.    [provider]  vitamin C (ASCORBIC ACID) 500 MG tablet Take 500 mg by mouth daily.    [provider]  vitamin E 180 MG (400 UNITS) capsule Take 400 Units by mouth daily.    [provider]    Family History Family History  Problem Relation Age of Onset   Diabetes Mellitus II Mother    CAD Mother    Sleep apnea Mother    Osteoarthritis Mother    Osteoporosis Mother    Anxiety disorder Mother    Depression Mother    Bipolar disorder Mother    Bipolar disorder Father    Hypertension Father    Depression Father    Anxiety disorder Father  Post-traumatic stress disorder Sister     Social History Social History   Tobacco Use   Smoking status: Former    Packs/day: 0.50    Types: Cigarettes    Quit date: 09/01/2018    Years since quitting: 3.4   Smokeless tobacco: Never   Tobacco comments:    Patient quit smoking 09/01/2018  Vaping Use   Vaping Use: Never used  Substance Use Topics   Alcohol use: No     Alcohol/week: 0.0 standard drinks of alcohol   Drug use: No     Allergies   Diazepam, Ziprasidone hcl, Azithromycin, Divalproex sodium, Levofloxacin, Lisinopril, Metronidazole, Sulfa antibiotics, Valproic acid, Cephalexin, Ciprofloxacin, Doxycycline, and Penicillins   Review of Systems Review of Systems  Constitutional:  Positive for chills. Negative for fever.  HENT:  Positive for ear pain and sore throat.   Respiratory:  Positive for cough. Negative for shortness of breath.   Cardiovascular:  Negative for chest pain and palpitations.  Gastrointestinal:  Negative for diarrhea and vomiting.  Skin:  Negative for color change and rash.  All other systems reviewed and are negative.    Physical Exam Triage Vital Signs ED Triage Vitals  Enc Vitals Group     BP      Pulse      Resp      Temp      Temp src      SpO2      Weight      Height      Head Circumference      Peak Flow      Pain Score      Pain Loc      Pain Edu?      Excl. in [place]?    No data found.  Updated Vital Signs BP 110/70   Pulse 98   Temp 98.7 F (37.1 C)   Resp 18   Ht '5\' 4"'$  (1.626 m)   Wt 205 lb (93 kg)   LMP  (LMP Unknown)   SpO2 98%   BMI 35.19 kg/m   Visual Acuity Right Eye Distance:   Left Eye Distance:   Bilateral Distance:    Right Eye Near:   Left Eye Near:    Bilateral Near:     Physical Exam Vitals and nursing note reviewed.  Constitutional:      General: She is not in acute distress.    Appearance: She is well-developed. She is obese. She is not ill-appearing.  HENT:     Right Ear: Tympanic membrane normal.     Left Ear: Tympanic membrane normal.     Nose: Nose normal.     Mouth/Throat:     Mouth: Mucous membranes are moist.     Pharynx: Oropharynx is clear.  Cardiovascular:     Rate and Rhythm: Normal rate and regular rhythm.     Heart sounds: Normal heart sounds.  Pulmonary:     Effort: Pulmonary effort is normal. No respiratory distress.     Breath sounds:  Normal breath sounds.  Musculoskeletal:     Cervical back: Neck supple.  Skin:    General: Skin is warm and dry.  Neurological:     Mental Status: She is alert.  Psychiatric:        Mood and Affect: Mood normal.        Behavior: Behavior normal.      UC Treatments / Results  Labs (all labs ordered are listed, but only abnormal results are displayed) Labs  Reviewed  RESP PANEL BY RT-PCR (FLU A&B, COVID) [id number]    EKG   Radiology No results found.  Procedures Procedures (including critical care time)  Medications Ordered in UC Medications - No data to display  Initial Impression / Assessment and Plan / UC Course  I have reviewed the triage vital signs and the nursing notes.  Pertinent labs & imaging results that were available during my care of the patient were reviewed by me and considered in my medical decision making (see chart for details).    Viral illness.  COVID and flu pending.  If patient is COVID positive, would recommend molnupiravir.  If patient is flu positive, would recommend Tamiflu.  Discussed symptomatic treatment including Tylenol or ibuprofen, rest, hydration.  Instructed patient to follow up with her PCP if symptoms are not improving.  She agrees to plan of care.   Final Clinical Impressions(s) / UC Diagnoses   Final diagnoses:  Chills  Viral illness     Discharge Instructions      Your COVID and Flu tests are pending.    Take Tylenol or ibuprofen as needed for fever or discomfort.  Rest and keep yourself hydrated.    Follow-up with your primary care provider if your symptoms are not improving.         ED Prescriptions   None    PDMP not reviewed this encounter.   Sharion Balloon, NP 01/26/22 [phone]

## 2022-01-26 NOTE — Discharge Instructions (Addendum)
Your COVID and Flu tests are pending.    Take Tylenol or ibuprofen as needed for fever or discomfort.  Rest and keep yourself hydrated.    Follow-up with your primary care provider if your symptoms are not improving.

## 2022-01-27 ENCOUNTER — Telehealth: Payer: Self-pay | Admitting: Emergency Medicine

## 2022-01-27 MED ORDER — MOLNUPIRAVIR EUA 200MG CAPSULE: 4.0000 | ORAL_CAPSULE | Freq: Two times a day (BID) | ORAL | 0 refills | Status: AC

## 2022-01-27 NOTE — Telephone Encounter (Signed)
Molnupiravir sent to pharmacy.  Patient notified via telephone.  Instructed patient to follow up with her PCP if her symptoms are not improving.  She agrees to plan of care.

## 2022-02-18 ENCOUNTER — Emergency Department
Admission: EM | Admit: 2022-02-18 | Discharge: 2022-02-18 | Disposition: A | Discharge status: Home / Self Care | Payer: Medicare HMO | Attending: Emergency Medicine | Admitting: Emergency Medicine

## 2022-02-18 ENCOUNTER — Other Ambulatory Visit: Payer: Self-pay

## 2022-02-18 DIAGNOSIS — K649 Unspecified hemorrhoids: Secondary | ICD-10-CM | POA: Diagnosis not present

## 2022-02-18 DIAGNOSIS — K625 Hemorrhage of anus and rectum: Secondary | ICD-10-CM | POA: Diagnosis present

## 2022-02-18 LAB — COMPREHENSIVE METABOLIC PANEL
ALT: 17 U/L (ref 0–44)
AST: 18 U/L (ref 15–41)
Albumin: 4.1 g/dL (ref 3.5–5.0)
Alkaline Phosphatase: 109 U/L (ref 38–126)
Anion gap: 12 (ref 5–15)
BUN: 20 mg/dL (ref 6–20)
CO2: 26 mmol/L (ref 22–32)
Calcium: 8.8 mg/dL — ABNORMAL LOW (ref 8.9–10.3)
Chloride: 102 mmol/L (ref 98–111)
Creatinine, Ser: 0.9 mg/dL (ref 0.44–1.00)
GFR, Estimated: >60 mL/min (ref >60)
Glucose, Bld: 99 mg/dL (ref 70–99)
Potassium: 3.2 mmol/L — ABNORMAL LOW (ref 3.5–5.1)
Sodium: 140 mmol/L (ref 135–145)
Total Bilirubin: 0.6 mg/dL (ref 0.3–1.2)
Total Protein: 7.6 g/dL (ref 6.5–8.1)

## 2022-02-18 LAB — CBC
HCT: 37.4 % (ref 36.0–46.0)
Hemoglobin: 12 g/dL (ref 12.0–15.0)
MCH: 29.1 pg (ref 26.0–34.0)
MCHC: 32.1 g/dL (ref 30.0–36.0)
MCV: 90.6 fL (ref 80.0–100.0)
Platelets: 255 10*3/uL (ref 150–400)
RBC: 4.13 MIL/uL (ref 3.87–5.11)
RDW: 14.9 % (ref 11.5–15.5)
WBC: 6.9 10*3/uL (ref 4.0–10.5)
nRBC: 0 % (ref 0.0–0.2)

## 2022-02-18 LAB — TYPE AND SCREEN
ABO/RH(D): A NEG
Antibody Screen: NEGATIVE

## 2022-02-18 NOTE — ED Notes (Signed)
This RN collected ble top as well and sent down with others.

## 2022-02-18 NOTE — ED Provider Notes (Signed)
   [facility] Provider Note    Event Date/Time   First MD Initiated Contact with Patient 02/18/22 1927     (approximate)   History   Rectal bleeding   HPI  Marie Clarke is a 52 y.o. female with history of a surgically repaired prolapsed rectum who presents with complaints of rectal bleeding for about 1 to 2 weeks.  Patient notes small mount of bright red blood in the toilet and more on toilet paper after wiping.     Physical Exam   Triage Vital Signs: ED Triage Vitals [02/18/22 1734]  Enc Vitals Group     BP 117/78     Pulse Rate 96     Resp 16     Temp 98 F (36.7 C)     Temp Source Oral     SpO2 98 %     Weight 93.9 kg (207 lb)     Height 1.626 m ('5\' 4"'$ )     Head Circumference      Peak Flow      Pain Score 5     Pain Loc      Pain Edu?      Excl. in [place]?     Most recent vital signs: Vitals:   02/18/22 1734  BP: 117/78  Pulse: 96  Resp: 16  Temp: 98 F (36.7 C)  SpO2: 98%     General: Awake, no distress.  CV:  Good peripheral perfusion.  Resp:  Normal effort.  Abd:  No distention.  Rectal exam, no evidence of prolapse, large hemorrhoid noted at approximately 8:00, evidence of recent bleeding ther:     ED Results / Procedures / Treatments   Labs (all labs ordered are listed, but only abnormal results are displayed) Labs Reviewed  COMPREHENSIVE METABOLIC PANEL - Abnormal; Notable for the following components:      Result Value   Potassium 3.2 (*)    Calcium 8.8 (*)    All other components within normal limits  CBC  POC OCCULT BLOOD, ED  POC URINE PREG, ED  TYPE AND SCREEN     EKG     RADIOLOGY     PROCEDURES:  Critical Care performed:   Procedures   MEDICATIONS ORDERED IN ED: Medications - No data to display   IMPRESSION / MDM / [place] / ED COURSE  I reviewed the triage vital signs and the nursing notes. Patient's presentation is most consistent with acute presentation with potential  threat to life or bodily function.  Patient presents with rectal bleeding as above, differential includes rectal prolapse, diverticulosis, hemorrhoids  Lab work reviewed and is quite reassuring, hemoglobin is stable.  No evidence of rectal prolapse on exam, sizable hemorrhoid with evidence of recent bleeding, I suspect this is likely the cause of her bleeding over the last 2 weeks, will refer to general surgery as she reports that she has used Preparation H at home and her stools are soft        FINAL CLINICAL IMPRESSION(S) / ED DIAGNOSES   Final diagnoses:  Hemorrhoids, unspecified hemorrhoid type     Rx / DC Orders   ED Discharge Orders     None        Note:  This document was prepared using Dragon voice recognition software and may include unintentional dictation errors.   Lavonia Drafts, MD 02/18/22 [phone]

## 2022-02-18 NOTE — ED Triage Notes (Signed)
Pt to ED from home for rectal bleeding and prolapsed rectum x2 weeks. Pt has HX of same that required surgery. This RN viewed her rectum in the bathroom and it appears to be prolapsed and she pushed it back in. There was no blood. But she has pictures on her phone of bright red blood in the toilet. Pt is CAOx4 and in no acute distress.

## 2022-03-13 ENCOUNTER — Other Ambulatory Visit: Payer: Self-pay

## 2022-03-13 ENCOUNTER — Emergency Department
Admission: EM | Admit: 2022-03-13 | Discharge: 2022-03-13 | Disposition: A | Discharge status: Home / Self Care | Payer: Medicare HMO | Attending: Emergency Medicine | Admitting: Emergency Medicine

## 2022-03-13 ENCOUNTER — Emergency Department: Payer: Medicare HMO

## 2022-03-13 ENCOUNTER — Encounter: Payer: Self-pay | Admitting: Emergency Medicine

## 2022-03-13 DIAGNOSIS — N189 Chronic kidney disease, unspecified: Secondary | ICD-10-CM | POA: Diagnosis not present

## 2022-03-13 DIAGNOSIS — E1122 Type 2 diabetes mellitus with diabetic chronic kidney disease: Secondary | ICD-10-CM | POA: Diagnosis not present

## 2022-03-13 DIAGNOSIS — K529 Noninfective gastroenteritis and colitis, unspecified: Secondary | ICD-10-CM | POA: Diagnosis not present

## 2022-03-13 DIAGNOSIS — R1011 Right upper quadrant pain: Secondary | ICD-10-CM | POA: Diagnosis present

## 2022-03-13 LAB — URINALYSIS, ROUTINE W REFLEX MICROSCOPIC
Bacteria, UA: NONE SEEN
Bilirubin Urine: NEGATIVE
Glucose, UA: NEGATIVE mg/dL
Hgb urine dipstick: NEGATIVE
Ketones, ur: NEGATIVE mg/dL
Nitrite: NEGATIVE
Protein, ur: NEGATIVE mg/dL
Specific Gravity, Urine: 1.004 — ABNORMAL LOW (ref 1.005–1.030)
pH: 6 (ref 5.0–8.0)

## 2022-03-13 LAB — CBC WITH DIFFERENTIAL/PLATELET
Abs Immature Granulocytes: 0.01 10*3/uL (ref 0.00–0.07)
Basophils Absolute: 0 10*3/uL (ref 0.0–0.1)
Basophils Relative: 0 %
Eosinophils Absolute: 0.1 10*3/uL (ref 0.0–0.5)
Eosinophils Relative: 2 %
HCT: 36.5 % (ref 36.0–46.0)
Hemoglobin: 11.6 g/dL — ABNORMAL LOW (ref 12.0–15.0)
Immature Granulocytes: 0 %
Lymphocytes Relative: 31 %
Lymphs Abs: 1.9 10*3/uL (ref 0.7–4.0)
MCH: 29.1 pg (ref 26.0–34.0)
MCHC: 31.8 g/dL (ref 30.0–36.0)
MCV: 91.7 fL (ref 80.0–100.0)
Monocytes Absolute: 0.4 10*3/uL (ref 0.1–1.0)
Monocytes Relative: 7 %
Neutro Abs: 3.6 10*3/uL (ref 1.7–7.7)
Neutrophils Relative %: 60 %
Platelets: 239 10*3/uL (ref 150–400)
RBC: 3.98 MIL/uL (ref 3.87–5.11)
RDW: 14.6 % (ref 11.5–15.5)
WBC: 6.1 10*3/uL (ref 4.0–10.5)
nRBC: 0 % (ref 0.0–0.2)

## 2022-03-13 LAB — COMPREHENSIVE METABOLIC PANEL
ALT: 17 U/L (ref 0–44)
AST: 20 U/L (ref 15–41)
Albumin: 3.8 g/dL (ref 3.5–5.0)
Alkaline Phosphatase: 113 U/L (ref 38–126)
Anion gap: 5 (ref 5–15)
BUN: 21 mg/dL — ABNORMAL HIGH (ref 6–20)
CO2: 28 mmol/L (ref 22–32)
Calcium: 8.5 mg/dL — ABNORMAL LOW (ref 8.9–10.3)
Chloride: 107 mmol/L (ref 98–111)
Creatinine, Ser: 0.96 mg/dL (ref 0.44–1.00)
GFR, Estimated: >60 mL/min (ref >60)
Glucose, Bld: 120 mg/dL — ABNORMAL HIGH (ref 70–99)
Potassium: 3.5 mmol/L (ref 3.5–5.1)
Sodium: 140 mmol/L (ref 135–145)
Total Bilirubin: 0.6 mg/dL (ref 0.3–1.2)
Total Protein: 7.1 g/dL (ref 6.5–8.1)

## 2022-03-13 LAB — LIPASE, BLOOD: Lipase: 42 U/L (ref 11–51)

## 2022-03-13 MED ORDER — KETOROLAC TROMETHAMINE 30 MG/ML IJ SOLN
30.0000 mg | Freq: Once | INTRAMUSCULAR | Status: AC
  Administered 2022-03-13: 30 mg via INTRAMUSCULAR
  Filled 2022-03-13: qty 1

## 2022-03-13 NOTE — ED Provider Notes (Signed)
[facility] Provider Note    Event Date/Time   First MD Initiated Contact with Patient 03/13/22 [phone]     (approximate)   History   Abdominal Pain   HPI  Marie Clarke is a 52 y.o. female with a history of Polar disorder, CKD, diabetes, chronic pain, anxiety who presents with complaints of right upper quadrant abdominal pain.  Patient reports she has been having intermittent pain in her right upper quadrant for about 2 weeks with radiation to her right shoulder blade.  No vomiting reported.  No fevers.     Physical Exam   Triage Vital Signs: ED Triage Vitals  Enc Vitals Group     BP 03/13/22 0940 107/72     Pulse Rate 03/13/22 0940 84     Resp 03/13/22 0940 16     Temp 03/13/22 0940 98.2 F (36.8 C)     Temp Source 03/13/22 0940 Oral     SpO2 03/13/22 0940 97 %     Weight 03/13/22 0941 97.5 kg (215 lb)     Height 03/13/22 0941 1.626 m ('5\' 4"'$ )     Head Circumference --      Peak Flow --      Pain Score 03/13/22 0940 10     Pain Loc --      Pain Edu? --      Excl. in [place]? --     Most recent vital signs: Vitals:   03/13/22 0940 03/13/22 1130  BP: 107/72 110/70  Pulse: 84 80  Resp: 16 16  Temp: 98.2 F (36.8 C)   SpO2: 97% 98%     General: Awake, no distress.  CV:  Good peripheral perfusion.  Resp:  Normal effort.  Abd:  No distention.  Mild tenderness right upper quadrant, overall reassuring exam Other:     ED Results / Procedures / Treatments   Labs (all labs ordered are listed, but only abnormal results are displayed) Labs Reviewed  COMPREHENSIVE METABOLIC PANEL - Abnormal; Notable for the following components:      Result Value   Glucose, Bld 120 (*)    BUN 21 (*)    Calcium 8.5 (*)    All other components within normal limits  CBC WITH DIFFERENTIAL/PLATELET - Abnormal; Notable for the following components:   Hemoglobin 11.6 (*)    All other components within normal limits  URINALYSIS, ROUTINE W REFLEX MICROSCOPIC -  Abnormal; Notable for the following components:   Color, Urine STRAW (*)    APPearance CLEAR (*)    Specific Gravity, Urine 1.004 (*)    Leukocytes,Ua TRACE (*)    All other components within normal limits  LIPASE, BLOOD     EKG     RADIOLOGY Ultrasound viewed interpreted by me, not consistent with cholecystitis    PROCEDURES:  Critical Care performed:   Procedures   MEDICATIONS ORDERED IN ED: Medications  ketorolac (TORADOL) 30 MG/ML injection 30 mg (30 mg Intramuscular Given 03/13/22 1032)     IMPRESSION / MDM / [place] / ED COURSE  I reviewed the triage vital signs and the nursing notes. Patient's presentation is most consistent with acute presentation with potential threat to life or bodily function.  Patient with mild abdominal pain as described above.  Differential includes gastritis, colitis, cholecystitis, cholelithiasis.  Overall her exam is reassuring  Lab work reviewed, normal lipase, normal LFTs  Ultrasound pending.  Patient treated with IM Toradol  Patient is requesting discharge she has an  appointment that she is to get to, ultrasound result is reassuring, no evidence of cholecystitis.  She is also having diarrhea suspect mild colitis, outpatient follow-up recommended, return precautions discussed      FINAL CLINICAL IMPRESSION(S) / ED DIAGNOSES   Final diagnoses:  Colitis     Rx / DC Orders   ED Discharge Orders     None        Note:  This document was prepared using Dragon voice recognition software and may include unintentional dictation errors.   Lavonia Drafts, MD 03/13/22 1153

## 2022-03-13 NOTE — ED Triage Notes (Signed)
Pt states that she is having rlq pain for the past 3 weeks and is radiating up into her right shoulder, states the pain is getting worse rather than better, pt sent from [facility]

## 2022-03-14 IMAGING — CR DG CHEST 2V
1 series · 2 of 2 positions shown · non-contrast
Comparison: 12/16/2020

CLINICAL DATA: Shortness of breath

EXAM:
CHEST - 2 VIEW

[Series 1: dg chest 2 view · 0.14mm/px · 2 of 2 slices shown]
[im 1/2]
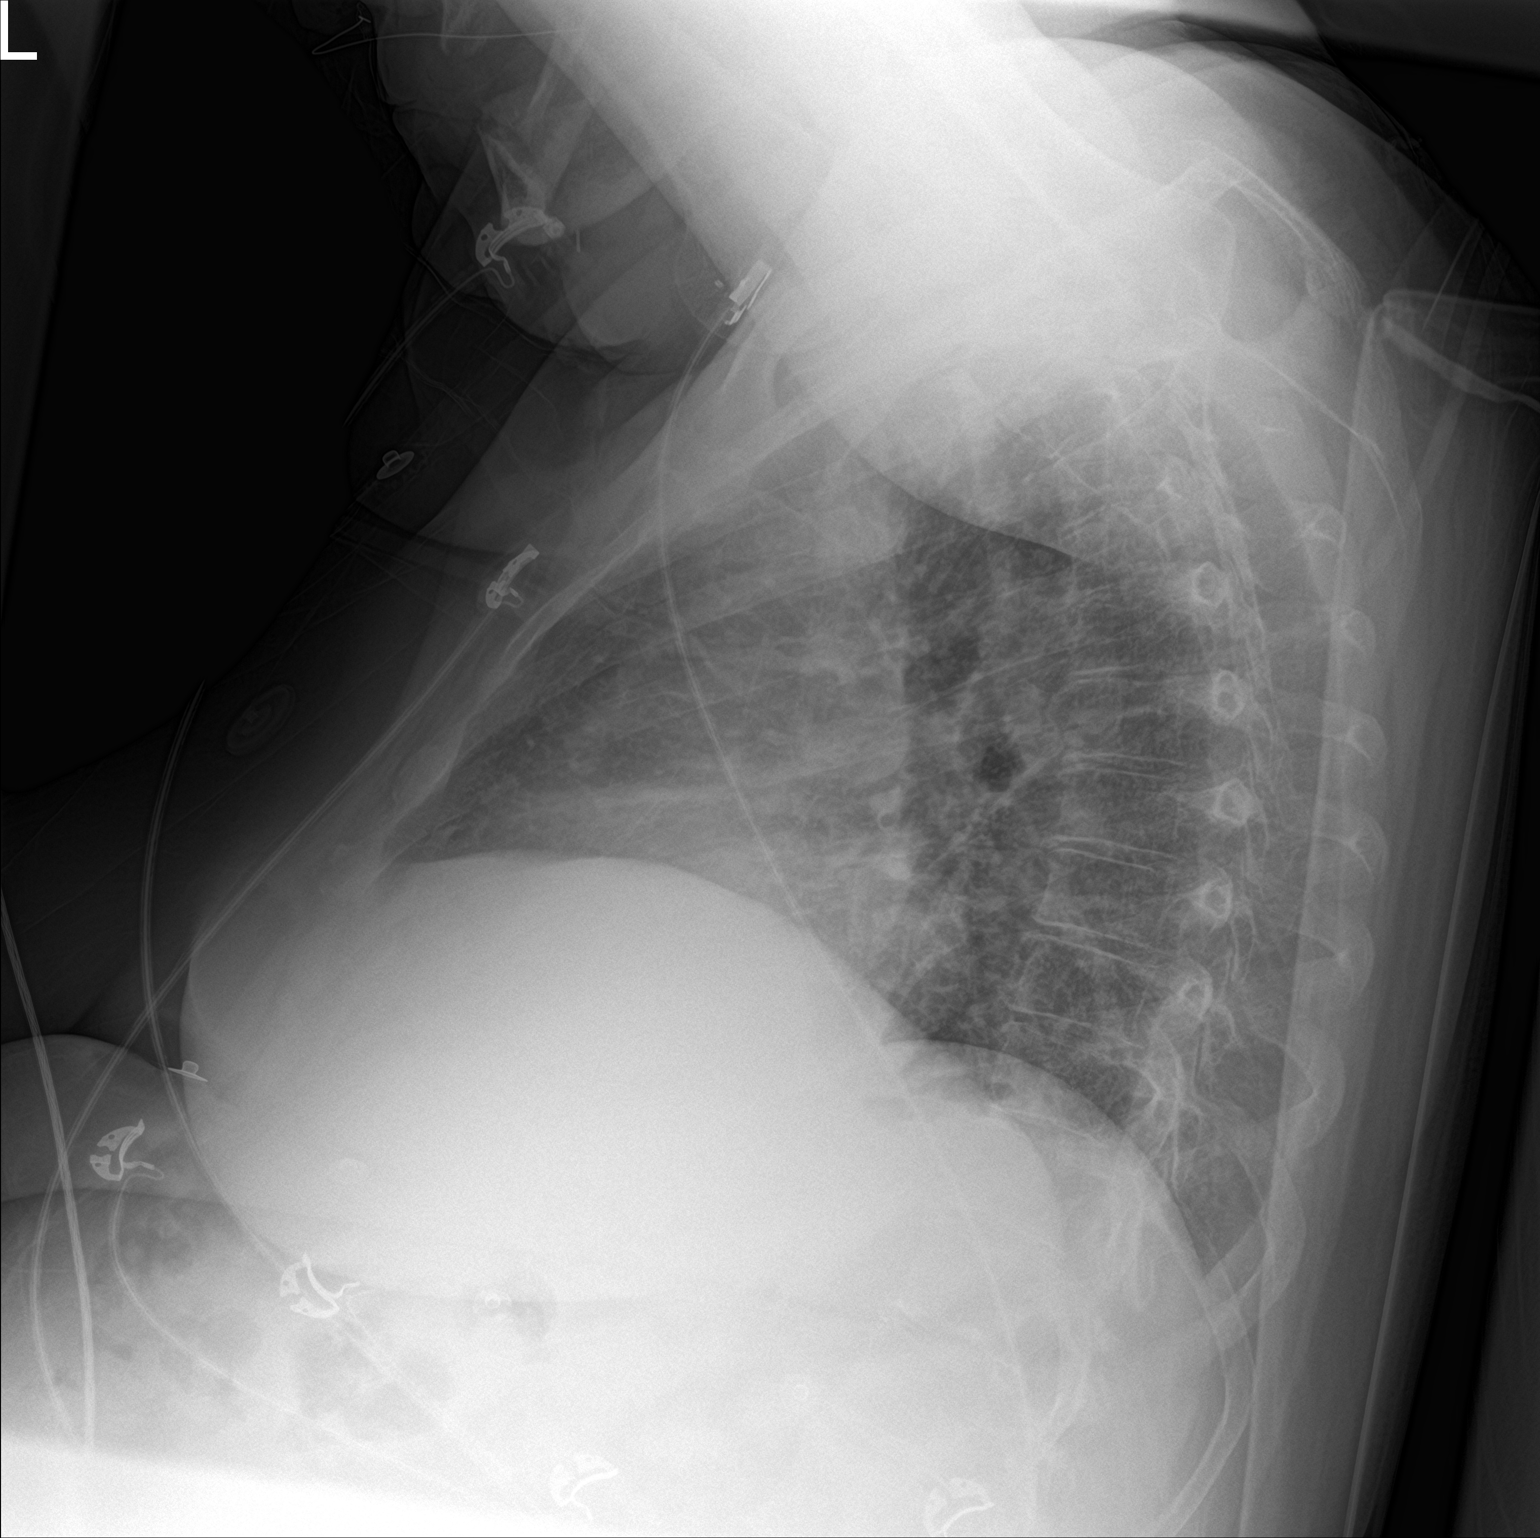
[im 2/2]
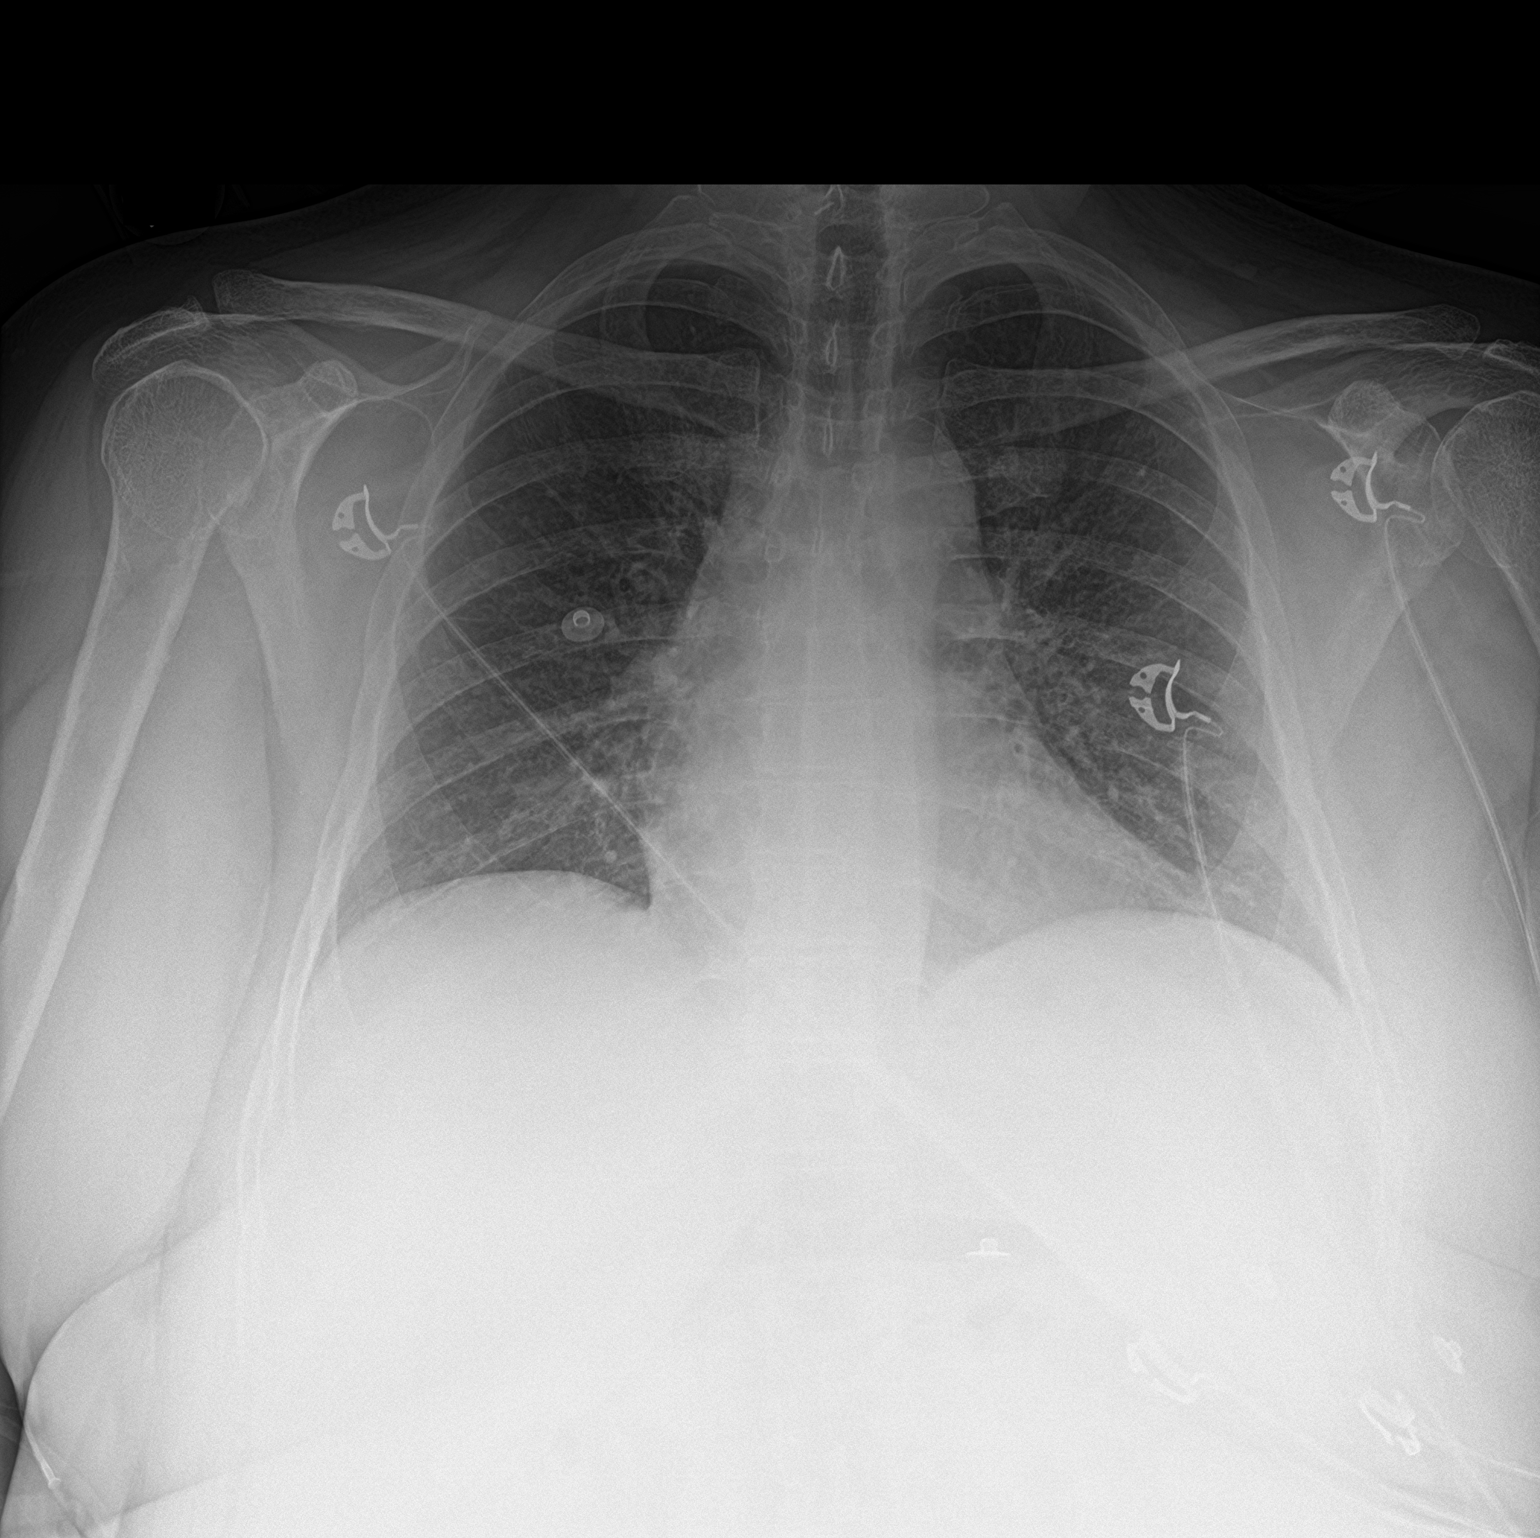

[2 of 2 positions shown; findings below may reference images not displayed]

FINDINGS: The cardiomediastinal silhouette is unremarkable.

Mild chronic peribronchial thickening again noted.

There is no evidence of focal airspace disease, pulmonary edema,
suspicious pulmonary nodule/mass, pleural effusion, or pneumothorax.

No acute bony abnormalities are identified.
IMPRESSION: No active cardiopulmonary disease.

Mild chronic peribronchial thickening.

## 2022-03-26 ENCOUNTER — Other Ambulatory Visit: Payer: Self-pay

## 2022-03-26 ENCOUNTER — Emergency Department: Payer: Medicare HMO

## 2022-03-26 ENCOUNTER — Encounter: Payer: Self-pay | Admitting: Emergency Medicine

## 2022-03-26 ENCOUNTER — Emergency Department
Admission: EM | Admit: 2022-03-26 | Discharge: 2022-03-27 | Disposition: A | Discharge status: Home / Self Care | Payer: Medicare HMO | Attending: Emergency Medicine | Admitting: Emergency Medicine

## 2022-03-26 DIAGNOSIS — R000 Tachycardia, unspecified: Secondary | ICD-10-CM | POA: Diagnosis not present

## 2022-03-26 DIAGNOSIS — R1030 Lower abdominal pain, unspecified: Secondary | ICD-10-CM | POA: Diagnosis present

## 2022-03-26 DIAGNOSIS — E876 Hypokalemia: Secondary | ICD-10-CM | POA: Diagnosis not present

## 2022-03-26 DIAGNOSIS — R109 Unspecified abdominal pain: Secondary | ICD-10-CM

## 2022-03-26 DIAGNOSIS — Z20822 Contact with and (suspected) exposure to covid-19: Secondary | ICD-10-CM | POA: Diagnosis not present

## 2022-03-26 DIAGNOSIS — R197 Diarrhea, unspecified: Secondary | ICD-10-CM | POA: Diagnosis not present

## 2022-03-26 LAB — CBC
HCT: 39 % (ref 36.0–46.0)
Hemoglobin: 12.9 g/dL (ref 12.0–15.0)
MCH: 29.9 pg (ref 26.0–34.0)
MCHC: 33.1 g/dL (ref 30.0–36.0)
MCV: 90.5 fL (ref 80.0–100.0)
Platelets: 284 10*3/uL (ref 150–400)
RBC: 4.31 MIL/uL (ref 3.87–5.11)
RDW: 14.2 % (ref 11.5–15.5)
WBC: 8.8 10*3/uL (ref 4.0–10.5)
nRBC: 0 % (ref 0.0–0.2)

## 2022-03-26 LAB — RESP PANEL BY RT-PCR (RSV, FLU A&B, COVID)  RVPGX2
Influenza A by PCR: NEGATIVE
Influenza B by PCR: NEGATIVE
Resp Syncytial Virus by PCR: NEGATIVE
SARS Coronavirus 2 by RT PCR: NEGATIVE

## 2022-03-26 LAB — COMPREHENSIVE METABOLIC PANEL
ALT: 17 U/L (ref 0–44)
AST: 19 U/L (ref 15–41)
Albumin: 4.2 g/dL (ref 3.5–5.0)
Alkaline Phosphatase: 111 U/L (ref 38–126)
Anion gap: 12 (ref 5–15)
BUN: 18 mg/dL (ref 6–20)
CO2: 25 mmol/L (ref 22–32)
Calcium: 8.9 mg/dL (ref 8.9–10.3)
Chloride: 104 mmol/L (ref 98–111)
Creatinine, Ser: 1.04 mg/dL — ABNORMAL HIGH (ref 0.44–1.00)
GFR, Estimated: >60 mL/min (ref >60)
Glucose, Bld: 117 mg/dL — ABNORMAL HIGH (ref 70–99)
Potassium: 3.1 mmol/L — ABNORMAL LOW (ref 3.5–5.1)
Sodium: 141 mmol/L (ref 135–145)
Total Bilirubin: 0.6 mg/dL (ref 0.3–1.2)
Total Protein: 7.3 g/dL (ref 6.5–8.1)

## 2022-03-26 LAB — URINALYSIS, ROUTINE W REFLEX MICROSCOPIC
Bacteria, UA: NONE SEEN
Bilirubin Urine: NEGATIVE
Glucose, UA: NEGATIVE mg/dL
Hgb urine dipstick: NEGATIVE
Ketones, ur: NEGATIVE mg/dL
Nitrite: NEGATIVE
Protein, ur: NEGATIVE mg/dL
Specific Gravity, Urine: 1.017 (ref 1.005–1.030)
pH: 5 (ref 5.0–8.0)

## 2022-03-26 LAB — LIPASE, BLOOD: Lipase: 55 U/L — ABNORMAL HIGH (ref 11–51)

## 2022-03-26 LAB — TROPONIN I (HIGH SENSITIVITY): Troponin I (High Sensitivity): 4 ng/L (ref <18)

## 2022-03-26 MED ORDER — ONDANSETRON HCL 4 MG/2ML IJ SOLN
4.0000 mg | Freq: Once | INTRAMUSCULAR | Status: AC
  Administered 2022-03-26: 4 mg via INTRAVENOUS
  Filled 2022-03-26: qty 2

## 2022-03-26 MED ORDER — IOHEXOL 300 MG/ML  SOLN
100.0000 mL | Freq: Once | INTRAMUSCULAR | Status: AC | PRN
  Administered 2022-03-26: 100 mL via INTRAVENOUS

## 2022-03-26 MED ORDER — HYDROMORPHONE HCL 1 MG/ML IJ SOLN
0.5000 mg | Freq: Once | INTRAMUSCULAR | Status: AC
  Administered 2022-03-26: 0.5 mg via INTRAVENOUS
  Filled 2022-03-26: qty 0.5

## 2022-03-26 MED ORDER — POTASSIUM CHLORIDE 10 MEQ/100ML IV SOLN
10.0000 meq | INTRAVENOUS | Status: AC
  Administered 2022-03-26 – 2022-03-27 (×2): 10 meq via INTRAVENOUS
  Filled 2022-03-26 (×2): qty 100

## 2022-03-26 MED ORDER — SODIUM CHLORIDE 0.9 % IV BOLUS
1000.0000 mL | Freq: Once | INTRAVENOUS | Status: AC
  Administered 2022-03-26: 1000 mL via INTRAVENOUS

## 2022-03-26 MED ORDER — HYDROMORPHONE HCL 1 MG/ML IJ SOLN
1.0000 mg | Freq: Once | INTRAMUSCULAR | Status: AC
  Administered 2022-03-26: 1 mg via INTRAVENOUS
  Filled 2022-03-26: qty 1

## 2022-03-26 NOTE — ED Notes (Signed)
Patient transported to CT 

## 2022-03-26 NOTE — ED Triage Notes (Signed)
Pt to ED via EMS from home c/o gen. abd pain radiating to the right and diarrhea more than 10 times for 2 days.  States feels like insides being ripped out when she goes to the bathroom.  Denies n/v.  States hx of stomach surgery and SBO.  States new diabetic medication, Rybelsus, 1 week ago.  Pt A&Ox4, chest rise even and unlabored, skin WNL and in NAD at this time.

## 2022-03-26 NOTE — ED Triage Notes (Signed)
EMS brings pt in from home for c/o abd painand diarrhea

## 2022-03-26 NOTE — ED Provider Notes (Signed)
[facility] Provider Note    Event Date/Time   First MD Initiated Contact with Patient 03/26/22 2200     (approximate)   History   Diarrhea and Abdominal Pain   HPI  Marie Clarke is a 52 y.o. female with history of prior SBO with hernia repairs in the past who comes in with concerns for 2 days of abdominal pain.  Patient reports more pain in the lower abdomen associate with multiple episodes of diarrhea.  Reports having a history of an SBO needing an NG tube previously and this feels similar to that.  Does report a new medication for diabetes 1 week ago not sure if that could be related.  Denies any falls hitting her head chest pain shortness of breath or any other concerns   Physical Exam   Triage Vital Signs: ED Triage Vitals  Enc Vitals Group     BP 03/26/22 2041 115/81     Pulse Rate 03/26/22 2041 (!) 117     Resp 03/26/22 2041 18     Temp 03/26/22 2041 98.5 F (36.9 C)     Temp Source 03/26/22 2041 Oral     SpO2 03/26/22 2041 97 %     Weight 03/26/22 2043 205 lb (93 kg)     Height 03/26/22 2043 5' 4"$  (1.626 m)     Head Circumference --      Peak Flow --      Pain Score 03/26/22 2042 10     Pain Loc --      Pain Edu? --      Excl. in [place]? --     Most recent vital signs: Vitals:   03/26/22 2041  BP: 115/81  Pulse: (!) 117  Resp: 18  Temp: 98.5 F (36.9 C)  SpO2: 97%     General: Awake, no distress.  CV:  Good peripheral perfusion.  Resp:  Normal effort.  Abd:  No distention.  Tender in the lower abdomen Other:     ED Results / Procedures / Treatments   Labs (all labs ordered are listed, but only abnormal results are displayed) Labs Reviewed  LIPASE, BLOOD - Abnormal; Notable for the following components:      Result Value   Lipase 55 (*)    All other components within normal limits  COMPREHENSIVE METABOLIC PANEL - Abnormal; Notable for the following components:   Potassium 3.1 (*)    Glucose, Bld 117 (*)    Creatinine,  Ser 1.04 (*)    All other components within normal limits  URINALYSIS, ROUTINE W REFLEX MICROSCOPIC - Abnormal; Notable for the following components:   Color, Urine YELLOW (*)    APPearance CLEAR (*)    Leukocytes,Ua TRACE (*)    All other components within normal limits  CBC     EKG  My interpretation of EKG:  Sinus tachycardia rate of 102 without any ST elevation or T wave inversions, normal intervals  RADIOLOGY pending  PROCEDURES:  Critical Care performed: No  .1-3 Lead EKG Interpretation  Performed by: Vanessa [place], MD Authorized by: Vanessa [place], MD     Interpretation: abnormal     ECG rate:  120   ECG rate assessment: tachycardic     Rhythm: sinus tachycardia     Ectopy: none     Conduction: normal      MEDICATIONS ORDERED IN ED: Medications  HYDROmorphone (DILAUDID) injection 0.5 mg (has no administration in time range)  ondansetron (ZOFRAN) injection  4 mg (has no administration in time range)  sodium chloride 0.9 % bolus 1,000 mL (has no administration in time range)     IMPRESSION / MDM / ASSESSMENT AND PLAN / ED COURSE  I reviewed the triage vital signs and the nursing notes.   Patient's presentation is most consistent with acute presentation with potential threat to life or bodily function.   Patient comes in pain tachycardic we will give some Dilaudid Zofran fluids will get CT scan evaluate for obstruction, perforation.  UA negative for UTI lipase slightly elevated CMP slightly low potassium we will give some IV repletion.  Creatinine slightly elevated CBC normal  Patient had off to oncoming team pending CT imaging and reevaluation  The patient is on the cardiac monitor to evaluate for evidence of arrhythmia and/or significant heart rate changes.      FINAL CLINICAL IMPRESSION(S) / ED DIAGNOSES   Final diagnoses:  Abdominal pain, unspecified abdominal location  Tachycardia     Rx / DC Orders   ED Discharge Orders     None         Note:  This document was prepared using Dragon voice recognition software and may include unintentional dictation errors.   Vanessa Scotland, MD 03/26/22 2227

## 2022-03-26 NOTE — ED Notes (Signed)
Patient transported back from CT 

## 2022-03-26 NOTE — ED Provider Notes (Signed)
-----------------------------------------   11:15 PM on 03/26/2022 -----------------------------------------  Assuming care from Dr. Jari Pigg.  In short, Marie Clarke is a 52 y.o. female with a chief complaint of abdominal pain with diarrhea.  Refer to the original H&P for additional details.  The current plan of care is to follow-up on lab work and CT scan.  Anticipate discharge if imaging and lab work are reassuring.   Clinical Course as of 03/27/22 0110  Tue Mar 27, 2022  0004 CT ABDOMEN PELVIS W CONTRAST I viewed and interpreted the patient's CT of the abdomen and pelvis.  I see no evidence of SBO or ileus.  Radiologist report concurs that there does not seem to be any acute or emergent medical condition at this time.  Patient just received analgesia.  I will reassess shortly and I anticipate discharge and outpatient follow-up. [CF]  0105 I reassessed the patient.  She seems much more comfortable at this time.  She still has a little less than half of the IV fluids and potassium to be infused.  I palpated her abdomen she has no tenderness to palpation and no guarding.  She was asking me questions about whether or not the diarrhea could be the result of a new diabetes medicine she is taking and that is certainly possible although it is possible that she had a foodborne pathogen or viral GI illness.  Regardless, I considered hospitalization, but given that her symptoms have improved substantially and her workup has been reassuring, there is no indication she would benefit from staying in the hospital.  She is happy with going home and in fact asked me how soon we could get her out of here.  I encouraged her to let us finish the infusions and she can be discharged afterwards.  I gave my usual and customary follow-up recommendations and return precautions. [CF]    Clinical Course User Index [CF] Hinda Kehr, MD     Medications  potassium chloride SA (KLOR-CON M) CR tablet 40 mEq (has no  administration in time range)  HYDROmorphone (DILAUDID) injection 0.5 mg (0.5 mg Intravenous Given 03/26/22 2241)  ondansetron (ZOFRAN) injection 4 mg (4 mg Intravenous Given 03/26/22 2241)  sodium chloride 0.9 % bolus 1,000 mL (0 mLs Intravenous Stopped 03/27/22 0100)  potassium chloride 10 mEq in 100 mL IVPB (0 mEq Intravenous Stopped 03/27/22 0001)  iohexol (OMNIPAQUE) 300 MG/ML solution 100 mL (100 mLs Intravenous Contrast Given 03/26/22 2334)  HYDROmorphone (DILAUDID) injection 1 mg (1 mg Intravenous Given 03/26/22 2357)     ED Discharge Orders     None      Final diagnoses:  Abdominal pain, unspecified abdominal location  Hypokalemia  Diarrhea, unspecified type     Hinda Kehr, MD 03/27/22 0110

## 2022-03-26 NOTE — ED Notes (Signed)
Report received, this RN now assuming care.

## 2022-03-27 DIAGNOSIS — R1030 Lower abdominal pain, unspecified: Secondary | ICD-10-CM | POA: Diagnosis not present

## 2022-03-27 MED ORDER — POTASSIUM CHLORIDE CRYS ER 20 MEQ PO TBCR: 40.0000 meq | EXTENDED_RELEASE_TABLET | Freq: Once | ORAL | Status: DC

## 2022-03-27 NOTE — Discharge Instructions (Addendum)
We believe your symptoms are caused by either a viral infection or possibly a bad food exposure.  It is also possible that you are having stomach upset and diarrhea as a result of your new diabetes medication.  Either way, since your symptoms have improved, we feel it is safe for you to go home and follow up with your regular doctor.  Please read the included information and stick to a bland diet for the next two days.  Drink plenty of clear fluids, and if you were provided with a prescription, please take it according to the label instructions.    Continue to take your potassium supplements, because diarrhea can make patient's potassium go down and yours was a little bit low tonight.  If you develop any new or worsening symptoms, including persistent vomiting not controlled with medication, fever greater than 101, severe or worsening abdominal pain, or other symptoms that concern you, please return immediately to the Emergency Department.

## 2022-03-27 NOTE — ED Notes (Signed)
Pt verbalized understanding of DC instructions. Signing pad did not work.

## 2022-03-27 NOTE — ED Notes (Signed)
ED Provider at bedside. 

## 2022-03-27 NOTE — ED Notes (Signed)
Pt has called this RN in multiple times asking to speak with the doctor. This Rn informed Dr. Jari Pigg and later Dr. Karma Greaser and they stated they would speak with her as soon as they are able. Pt called recently yelling that she wanted the "head nurse" Harrington Challenger was notified. This RN entered the room and informed the pt that she would be coming to speak with her. Pt stated "this is bullshit, you  told me an hour ago the doctor would come in" this RN reminded pt that I stated as soon as the doctor was able and he has not been able to yet. Pt again stated "this is bullshit" and stated that "you're copping an attitude with me" this RN apologized if the pt was unhappy with her care.

## 2022-03-27 NOTE — ED Notes (Signed)
Charge RN Lorriane Shire now at bedside.

## 2022-03-27 NOTE — ED Notes (Signed)
Pt c/o increasing abd pain. Dr. Jari Pigg notified and new orders placed and medication administered. Pt sts she is upset that a doctor hasn't been in to speak with her and Dr. Jari Pigg was notified.

## 2022-03-27 NOTE — ED Notes (Addendum)
Writer called to speak with pt on request by pt. Pt requesting to have staff recognized for the care provided to her. Pt informed that writer would send out recognition to staff reported.

## 2022-06-19 ENCOUNTER — Other Ambulatory Visit: Payer: Self-pay | Admitting: Nurse Practitioner

## 2022-06-19 DIAGNOSIS — Z1231 Encounter for screening mammogram for malignant neoplasm of breast: Secondary | ICD-10-CM

## 2022-07-03 ENCOUNTER — Encounter: Payer: Self-pay | Admitting: Emergency Medicine

## 2022-07-03 ENCOUNTER — Emergency Department: Payer: Medicare HMO

## 2022-07-03 DIAGNOSIS — E1122 Type 2 diabetes mellitus with diabetic chronic kidney disease: Secondary | ICD-10-CM | POA: Insufficient documentation

## 2022-07-03 DIAGNOSIS — I5032 Chronic diastolic (congestive) heart failure: Secondary | ICD-10-CM | POA: Diagnosis not present

## 2022-07-03 DIAGNOSIS — I130 Hypertensive heart and chronic kidney disease with heart failure and stage 1 through stage 4 chronic kidney disease, or unspecified chronic kidney disease: Secondary | ICD-10-CM | POA: Diagnosis not present

## 2022-07-03 DIAGNOSIS — J45909 Unspecified asthma, uncomplicated: Secondary | ICD-10-CM | POA: Insufficient documentation

## 2022-07-03 DIAGNOSIS — I2510 Atherosclerotic heart disease of native coronary artery without angina pectoris: Secondary | ICD-10-CM | POA: Insufficient documentation

## 2022-07-03 DIAGNOSIS — N1831 Chronic kidney disease, stage 3a: Secondary | ICD-10-CM | POA: Insufficient documentation

## 2022-07-03 DIAGNOSIS — R252 Cramp and spasm: Secondary | ICD-10-CM | POA: Insufficient documentation

## 2022-07-03 DIAGNOSIS — J449 Chronic obstructive pulmonary disease, unspecified: Secondary | ICD-10-CM | POA: Diagnosis not present

## 2022-07-03 LAB — CBC WITH DIFFERENTIAL/PLATELET
Abs Immature Granulocytes: 0.01 10*3/uL (ref 0.00–0.07)
Basophils Absolute: 0 10*3/uL (ref 0.0–0.1)
Basophils Relative: 0 %
Eosinophils Absolute: 0.1 10*3/uL (ref 0.0–0.5)
Eosinophils Relative: 2 %
HCT: 37.6 % (ref 36.0–46.0)
Hemoglobin: 12.2 g/dL (ref 12.0–15.0)
Immature Granulocytes: 0 %
Lymphocytes Relative: 32 %
Lymphs Abs: 2.4 10*3/uL (ref 0.7–4.0)
MCH: 28.9 pg (ref 26.0–34.0)
MCHC: 32.4 g/dL (ref 30.0–36.0)
MCV: 89.1 fL (ref 80.0–100.0)
Monocytes Absolute: 0.4 10*3/uL (ref 0.1–1.0)
Monocytes Relative: 6 %
Neutro Abs: 4.5 10*3/uL (ref 1.7–7.7)
Neutrophils Relative %: 60 %
Platelets: 261 10*3/uL (ref 150–400)
RBC: 4.22 MIL/uL (ref 3.87–5.11)
RDW: 14.2 % (ref 11.5–15.5)
WBC: 7.4 10*3/uL (ref 4.0–10.5)
nRBC: 0 % (ref 0.0–0.2)

## 2022-07-03 LAB — CBG MONITORING, ED: Glucose-Capillary: 95 mg/dL (ref 70–99)

## 2022-07-03 NOTE — ED Triage Notes (Signed)
Pt presents ambulatory to triage via POV with complaints of bilateral leg cramping that started around 1700 today. Pt notes that when her K+ is low she has theses sx of dizziness, leg cramping, and chest discomfort which she is experiencing now. Rates the leg pain 10/10 - no meds taken PTA. A&Ox4 at this time. Denies SOB.

## 2022-07-03 NOTE — ED Notes (Signed)
CBG 95 in triage 

## 2022-07-04 ENCOUNTER — Emergency Department
Admission: EM | Admit: 2022-07-04 | Discharge: 2022-07-04 | Disposition: A | Discharge status: Home / Self Care | Payer: Medicare HMO | Attending: Emergency Medicine | Admitting: Emergency Medicine

## 2022-07-04 ENCOUNTER — Other Ambulatory Visit: Payer: Self-pay

## 2022-07-04 DIAGNOSIS — R252 Cramp and spasm: Secondary | ICD-10-CM | POA: Diagnosis not present

## 2022-07-04 LAB — COMPREHENSIVE METABOLIC PANEL
ALT: 20 U/L (ref 0–44)
AST: 21 U/L (ref 15–41)
Albumin: 4.1 g/dL (ref 3.5–5.0)
Alkaline Phosphatase: 101 U/L (ref 38–126)
Anion gap: 9 (ref 5–15)
BUN: 18 mg/dL (ref 6–20)
CO2: 25 mmol/L (ref 22–32)
Calcium: 8.6 mg/dL — ABNORMAL LOW (ref 8.9–10.3)
Chloride: 105 mmol/L (ref 98–111)
Creatinine, Ser: 0.86 mg/dL (ref 0.44–1.00)
GFR, Estimated: >60 mL/min (ref >60)
Glucose, Bld: 116 mg/dL — ABNORMAL HIGH (ref 70–99)
Potassium: 3.5 mmol/L (ref 3.5–5.1)
Sodium: 139 mmol/L (ref 135–145)
Total Bilirubin: 0.6 mg/dL (ref 0.3–1.2)
Total Protein: 7.3 g/dL (ref 6.5–8.1)

## 2022-07-04 LAB — URINALYSIS, ROUTINE W REFLEX MICROSCOPIC
Bilirubin Urine: NEGATIVE
Glucose, UA: NEGATIVE mg/dL
Hgb urine dipstick: NEGATIVE
Ketones, ur: NEGATIVE mg/dL
Nitrite: NEGATIVE
Protein, ur: NEGATIVE mg/dL
Specific Gravity, Urine: 1.011 (ref 1.005–1.030)
pH: 5 (ref 5.0–8.0)

## 2022-07-04 LAB — TROPONIN I (HIGH SENSITIVITY): Troponin I (High Sensitivity): 3 ng/L (ref <18)

## 2022-07-04 LAB — CK: Total CK: 141 U/L (ref 38–234)

## 2022-07-04 MED ORDER — METHOCARBAMOL 500 MG PO TABS
750.0000 mg | ORAL_TABLET | Freq: Once | ORAL | Status: AC
  Administered 2022-07-04: 750 mg via ORAL
  Filled 2022-07-04: qty 2

## 2022-07-04 MED ORDER — ACETAMINOPHEN 500 MG PO TABS
1000.0000 mg | ORAL_TABLET | Freq: Once | ORAL | Status: AC
  Administered 2022-07-04: 1000 mg via ORAL
  Filled 2022-07-04: qty 2

## 2022-07-04 NOTE — ED Notes (Signed)
Pt continues to c/o bilateral lower leg pain describe as " a big charlie horse in the back of my leg." Pt states she hasn't taken her muscle relaxer this afternoon. Dosed x3 daily. Also missed her night dose yesterday. Sts she didn't take it because she was unable to reach it d/t increasing pain. Pt has cane at bedside. VS updated. Call bell within reach

## 2022-07-04 NOTE — Discharge Instructions (Signed)
Your potassium was normal.  Continue to take your Robaxin as prescribed.  If your pain is worsening or starts to become worse or you notice that 1 leg is swollen then please return to the emergency department.

## 2022-07-04 NOTE — ED Provider Notes (Signed)
[facility] Provider Note    Event Date/Time   First MD Initiated Contact with Patient 07/04/22 [phone]     (approximate)   History   Muscle Pain   HPI  Marie Clarke is a 52 y.o. female past medical history of chronic pain, diabetic neuropathy, CAD, CHF who presents because of calf pain.  Since this evening patient has had bilateral lower leg cramping.  Describes it as muscle pain primarily in the cast.  Denies any other upper extremity or upper thigh pain.  Denies fevers or chills.  No numbness or tingling or weakness that is new.  Patient has neuropathy in her legs but this is unchanged today.  Denies weakness or back pain.  She did has not taken her Robaxin since yesterday which she thinks could be the cause of the pain     Past Medical History:  Diagnosis Date   Anemia    Anginal pain (HCC)    Anxiety    Arthritis    Asthma    Bilateral lower extremity edema    Bipolar disorder (HCC)    CAD (coronary artery disease) unk   CHF (congestive heart failure) (HCC)    Chronic pain syndrome    COPD (chronic obstructive pulmonary disease) (HCC)    emphysema   DDD (degenerative disc disease), lumbar    Depression unk   Diabetes mellitus without complication (HCC)    Diabetes mellitus, type II (HCC)    Drug overdose 2015   after dad died   Dysrhythmia    history of tachy arrythmias   Family history of adverse reaction to anesthesia    sister had shortness of breath after surgery   Fibromyalgia    GERD (gastroesophageal reflux disease)    Headache    chronic migraines   Hyperlipidemia    Hypertension    Hypothyroidism    Left leg pain 04/29/2014   MI (myocardial infarction) (HCC) 2007   Muscle ache 09/16/2014   Osteoporosis    Overactive bladder    Pancreatitis unk   Panic attacks    PTSD (post-traumatic stress disorder)    Reflex sympathetic dystrophy    Renal cyst, left    Renal insufficiency    ckd stage iii   Restless legs syndrome     Sleep apnea 06/2019   uses cpap   Stage 3a chronic kidney disease (HCC)    Stroke (HCC) 2010   TIA x 2. no residual deficits   Suicide attempt [facility])    after father died.   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   Seizure-like activity (HCC) 09/30/2020   Hyperlipidemia    COPD (chronic obstructive pulmonary disease) (HCC)    Chronic diastolic CHF (congestive heart failure) (HCC)    Pain in thoracic spine 05/02/2020   Rectal pain 04/07/2020   Partial small bowel obstruction (HCC) 03/29/2020   Adrenal nodule (HCC) 03/19/2020   Hypokalemia    SBO (small bowel obstruction) (HCC) 12/02/2019   Chronic pain 12/02/2019   Presence of neurostimulator (SCS) (June 2021) 10/06/2019   Pain due to any device, implant or graft  09/21/2019   Anxiety 09/10/2019   Involuntary movements 09/10/2019   Abnormal involuntary movement 08/18/2019   Bipolar disorder, in full remission, most recent episode mixed (HCC) 08/11/2019   Lumbosacral radiculopathy at L5 (Right) 04/09/2019   Chronic migraine 03/12/2019   Gout 01/21/2019   Noncompliance with treatment regimen 12/29/2018   BMI 40.0-44.9, adult (HCC) 11/19/2018   AKI (acute kidney injury) (HCC) 11/17/2018   Postural dizziness with presyncope 09/29/2018   Bipolar I disorder, most recent episode mixed (HCC) 09/10/2018   Panic attacks 09/10/2018   Chronic lumbosacral L5-S1 IVD protrusion (Bilateral) 08/25/2018   Lumbar lateral recess stenosis (L5-S1) (Bilateral) 08/25/2018   Wound disruption 08/05/2018   Osteoarthritis involving multiple joints 07/10/2018   Long term current use of non-steroidal anti-inflammatories (NSAID)  07/10/2018   NSAID induced gastritis 07/10/2018   DDD (degenerative disc disease), lumbosacral 04/22/2018   Disease related peripheral neuropathy 11/13/2017   Gastroesophageal reflux disease without esophagitis 11/13/2017   Occipital headache (Bilateral) 11/13/2017   Cervicogenic headache (Bilateral) (L>R) 11/13/2017   History of postoperative nausea 10/22/2017   Chronic shoulder pain (5th area of Pain) (Bilateral) (L>R) 10/09/2017   Ankle joint instability (Left) 10/09/2017   Ankle sprain, sequela (Left) 10/09/2017   History of psychiatric symptoms 10/09/2017   Chronic pain syndrome 10/02/2017   Spondylosis without myelopathy or radiculopathy, lumbosacral region 10/02/2017   Chronic musculoskeletal pain 10/02/2017   Elevated C-reactive protein (CRP) 09/10/2017   Elevated sed rate 09/10/2017   Chronic neck pain (4th area of Pain) (Bilateral) (L>R) 09/09/2017   Pharmacologic therapy 09/09/2017   Disorder of skeletal system 09/09/2017   Problems influencing health status 09/09/2017   Long term current use of opiate analgesic 09/09/2017   Tobacco use disorder 07/16/2017   Ventral hernia without obstruction or gangrene 06/25/2017   Strain of extensor muscle, fascia and tendon of left index finger at wrist and hand level, initial encounter 05/16/2017   Sepsis (HCC) 04/14/2017   Osteopenia 04/03/2017   Hypotension 09/17/2016   Contusion of knee (Left) 10/12/2015   Strain of knee (Left) 10/12/2015   Incidental lung nodule 04/28/2015   Chronic low back pain (1ry area of Pain) (Bilateral) (L>R) 03/28/2015   Chronic lower extremity pain (Referred) (2ry area of Pain) (Left) 03/28/2015   Abdominal wound dehiscence 03/28/2015   Encounter for pain management planning 03/28/2015   Morbid obesity (HCC) 03/28/2015   Abnormal CT scan, lumbar spine 03/28/2015   Lumbar facet hypertrophy 03/28/2015   Lumbar facet syndrome (Bilateral) (L>R) 03/28/2015   Lumbar foraminal stenosis (Bilateral) (L5-S1)  03/28/2015   Chronic ankle pain (3ry area of Pain) (Left) 03/28/2015   Neurogenic pain 03/28/2015   Neuropathic pain 03/28/2015   Myofascial pain 03/28/2015   History of suicide attempt 03/28/2015   PTSD (post-traumatic stress disorder) 01/13/2015   Abnormal gait 12/15/2014   Congestive heart failure (HCC) 11/15/2014   Abdominal wall abscess 09/20/2014   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   Diabetes mellitus, type 2 (HCC) 08/13/2014   Bipolar affective disorder (HCC) 08/13/2014   Type 2 diabetes mellitus with hyperlipidemia (HCC) 08/13/2014   Rectal prolapse 08/09/2014   Rectal bleeding 08/09/2014   Rectal bleed 08/09/2014   Affective bipolar disorder (HCC) 08/05/2014   Arteriosclerosis of coronary artery 08/05/2014   CCF (congestive cardiac failure) (HCC) 08/05/2014   Chronic kidney disease 08/05/2014   Detrusor muscle hypertonia 08/05/2014   Apnea, sleep 08/05/2014   Temporary cerebral vascular dysfunction 08/05/2014   Polypharmacy 04/29/2014   Other long term (current) drug therapy 04/29/2014  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   Chronic kidney disease, stage III (moderate) (HCC) 12/14/2013   Controlled diabetes mellitus type II without complication (HCC) 12/03/2013   Essential (primary) hypertension 12/03/2013   Adult hypothyroidism 12/03/2013   Controlled type 2 diabetes mellitus without complication (HCC) 12/03/2013     Physical Exam  Triage Vital Signs: ED Triage Vitals  Enc Vitals Group     BP 07/03/22 2332 122/63     Pulse Rate 07/03/22 2332 90     Resp 07/03/22 2332 16     Temp 07/03/22 2332 97.8 F (36.6 C)     Temp Source 07/03/22 2332 Oral     SpO2 07/03/22 2332 96 %     Weight 07/03/22 2331 215 lb (97.5 kg)     Height 07/03/22 2331 5\' 4"  (1.626 m)     Head Circumference --      Peak Flow --      Pain Score 07/03/22 2333 10     Pain Loc --      Pain Edu? --      Excl. in [place]? --     Most recent vital signs: Vitals:   07/03/22 2332 07/04/22 0237   BP: 122/63 102/78  Pulse: 90 87  Resp: 16 17  Temp: 97.8 F (36.6 C) 98.1 F (36.7 C)  SpO2: 96% 100%     General: Awake, no distress.  CV:  Good peripheral perfusion.  Resp:  Normal effort.  Abd:  No distention.  Neuro:             Awake, Alert, Oriented x 3  Other:  2+ DP pulses bilateral lower extremities, cap refill is good, no skin changes, compartments are soft in the calves, there is tenderness in the posterior calves but no focal abscess or fluctuance, intact strength with plantarflexion dorsiflexion, sensation intact to light touch bilateral lower extremitie   ED Results / Procedures / Treatments  Labs (all labs ordered are listed, but only abnormal results are displayed) Labs Reviewed  COMPREHENSIVE METABOLIC PANEL - Abnormal; Notable for the following components:      Result Value   Glucose, Bld 116 (*)    Calcium 8.6 (*)    All other components within normal limits  URINALYSIS, ROUTINE W REFLEX MICROSCOPIC - Abnormal; Notable for the following components:   Color, Urine YELLOW (*)    APPearance CLEAR (*)    Leukocytes,Ua TRACE (*)    Bacteria, UA RARE (*)    All other components within normal limits  CBC WITH DIFFERENTIAL/PLATELET  CK  CBG MONITORING, ED  TROPONIN I (HIGH SENSITIVITY)     EKG  EKG interpretation performed by myself: NSR, nml axis, nml intervals, no acute ischemic changes, inferior q waves    RADIOLOGY    PROCEDURES:  Critical Care performed: No  Procedures  The patient is on the cardiac monitor to evaluate for evidence of arrhythmia and/or significant heart rate changes.   MEDICATIONS ORDERED IN ED: Medications  methocarbamol (ROBAXIN) tablet 750 mg (750 mg Oral Given 07/04/22 0349)  acetaminophen (TYLENOL) tablet 1,000 mg (1,000 mg Oral Given 07/04/22 0349)     IMPRESSION / MDM / ASSESSMENT AND PLAN / ED COURSE  I reviewed the triage vital signs and the nursing notes.                              Patient's  presentation is most consistent with acute complicated illness / injury requiring  diagnostic workup.  Differential diagnosis includes, but is not limited to, muscle spasm, medication side effect, neuropathic pain, rhabdo, low suspicion for DVT, limb ischemia  Patient is a 52 year old female with multiple medical comorbidities who presents with bilateral calf pain since yesterday.  Denies any provoking activity or increase in activity from her baseline.  She does have history of neuropathy but says that this is not that pain.  It is cramping feels like muscle pain and is the bilateral calves.  On exam she has good perfusion and is neurovascularly intact.  There is tenderness in the bilateral calfs but it is diffuse nonfocal there is no evidence of compartment syndrome or skin or soft tissue infection.  Labs are reassuring.  A troponin was checked from triage patient is not having any chest pain or dyspnea and her EKG is nonischemic.  I did check a CK and this is negative.  She tells me that she has not taken her Robaxin today typically takes it 3 times a day so I did give her dose of Robaxin in the ED.  My suspicion for serious underlying cause of her symptoms is low.  She is appropriate for discharge at this time.     FINAL CLINICAL IMPRESSION(S) / ED DIAGNOSES   Final diagnoses:  Muscle cramps     Rx / DC Orders   ED Discharge Orders     None        Note:  This document was prepared using Dragon voice recognition software and may include unintentional dictation errors.   Georga Hacking, MD 07/04/22 [phone]

## 2022-09-05 ENCOUNTER — Ambulatory Visit: Admit: 2022-09-05 | Payer: Medicaid Other | Admitting: Internal Medicine

## 2022-09-05 SURGERY — COLONOSCOPY WITH PROPOFOL
Anesthesia: General

## 2022-09-12 ENCOUNTER — Encounter: Payer: Self-pay | Admitting: Internal Medicine

## 2022-09-14 ENCOUNTER — Encounter: Payer: Self-pay | Admitting: *Deleted

## 2022-09-19 ENCOUNTER — Ambulatory Visit: Payer: Medicare HMO | Admitting: Anesthesiology

## 2022-09-19 ENCOUNTER — Encounter: Payer: Self-pay | Admitting: Internal Medicine

## 2022-09-19 ENCOUNTER — Other Ambulatory Visit: Payer: Self-pay

## 2022-09-19 ENCOUNTER — Encounter
Admission: RE | Disposition: A | Discharge status: Home / Self Care | Payer: Self-pay | Source: Home / Self Care | Attending: Internal Medicine

## 2022-09-19 ENCOUNTER — Ambulatory Visit
Admission: RE | Admit: 2022-09-19 | Discharge: 2022-09-19 | Disposition: A | Discharge status: Home / Self Care | Payer: Medicare HMO | Attending: Internal Medicine | Admitting: Internal Medicine

## 2022-09-19 DIAGNOSIS — K219 Gastro-esophageal reflux disease without esophagitis: Secondary | ICD-10-CM | POA: Insufficient documentation

## 2022-09-19 DIAGNOSIS — F319 Bipolar disorder, unspecified: Secondary | ICD-10-CM | POA: Insufficient documentation

## 2022-09-19 DIAGNOSIS — I509 Heart failure, unspecified: Secondary | ICD-10-CM | POA: Insufficient documentation

## 2022-09-19 DIAGNOSIS — J4489 Other specified chronic obstructive pulmonary disease: Secondary | ICD-10-CM | POA: Diagnosis not present

## 2022-09-19 DIAGNOSIS — E039 Hypothyroidism, unspecified: Secondary | ICD-10-CM | POA: Insufficient documentation

## 2022-09-19 DIAGNOSIS — F419 Anxiety disorder, unspecified: Secondary | ICD-10-CM | POA: Insufficient documentation

## 2022-09-19 DIAGNOSIS — I252 Old myocardial infarction: Secondary | ICD-10-CM | POA: Diagnosis not present

## 2022-09-19 DIAGNOSIS — Z7985 Long-term (current) use of injectable non-insulin antidiabetic drugs: Secondary | ICD-10-CM | POA: Diagnosis not present

## 2022-09-19 DIAGNOSIS — N1831 Chronic kidney disease, stage 3a: Secondary | ICD-10-CM | POA: Insufficient documentation

## 2022-09-19 DIAGNOSIS — I25119 Atherosclerotic heart disease of native coronary artery with unspecified angina pectoris: Secondary | ICD-10-CM | POA: Diagnosis not present

## 2022-09-19 DIAGNOSIS — E1122 Type 2 diabetes mellitus with diabetic chronic kidney disease: Secondary | ICD-10-CM | POA: Insufficient documentation

## 2022-09-19 DIAGNOSIS — M797 Fibromyalgia: Secondary | ICD-10-CM | POA: Diagnosis not present

## 2022-09-19 DIAGNOSIS — Z8673 Personal history of transient ischemic attack (TIA), and cerebral infarction without residual deficits: Secondary | ICD-10-CM | POA: Insufficient documentation

## 2022-09-19 DIAGNOSIS — G4730 Sleep apnea, unspecified: Secondary | ICD-10-CM | POA: Diagnosis not present

## 2022-09-19 DIAGNOSIS — I130 Hypertensive heart and chronic kidney disease with heart failure and stage 1 through stage 4 chronic kidney disease, or unspecified chronic kidney disease: Secondary | ICD-10-CM | POA: Diagnosis not present

## 2022-09-19 DIAGNOSIS — R569 Unspecified convulsions: Secondary | ICD-10-CM | POA: Diagnosis not present

## 2022-09-19 HISTORY — PX: BIOPSY: SHX5522

## 2022-09-19 HISTORY — PX: ESOPHAGOGASTRODUODENOSCOPY (EGD) WITH PROPOFOL: SHX5813

## 2022-09-19 LAB — GLUCOSE, CAPILLARY: Glucose-Capillary: 115 mg/dL — ABNORMAL HIGH (ref 70–99)

## 2022-09-19 SURGERY — ESOPHAGOGASTRODUODENOSCOPY (EGD) WITH PROPOFOL
Anesthesia: General

## 2022-09-19 SURGERY — EGD (ESOPHAGOGASTRODUODENOSCOPY)
Anesthesia: General

## 2022-09-19 MED ORDER — SODIUM CHLORIDE 0.9 % IV SOLN: INTRAVENOUS | Status: DC

## 2022-09-19 MED ORDER — LIDOCAINE HCL (CARDIAC) PF 100 MG/5ML IV SOSY
PREFILLED_SYRINGE | INTRAVENOUS | Status: DC | PRN
  Administered 2022-09-19: 50 mg via INTRAVENOUS

## 2022-09-19 MED ORDER — LIDOCAINE HCL (PF) 2 % IJ SOLN
INTRAMUSCULAR | Status: AC
  Filled 2022-09-19: qty 5

## 2022-09-19 MED ORDER — PROPOFOL 10 MG/ML IV BOLUS
INTRAVENOUS | Status: DC | PRN
Start: 2022-09-19 — End: 2022-09-19
  Administered 2022-09-19: 70 mg via INTRAVENOUS

## 2022-09-19 MED ORDER — PROPOFOL 1000 MG/100ML IV EMUL
INTRAVENOUS | Status: AC
  Filled 2022-09-19: qty 100

## 2022-09-19 MED ORDER — DEXMEDETOMIDINE HCL IN NACL 80 MCG/20ML IV SOLN
INTRAVENOUS | Status: DC | PRN
  Administered 2022-09-19: 20 ug via INTRAVENOUS

## 2022-09-19 MED ORDER — PROPOFOL 500 MG/50ML IV EMUL
INTRAVENOUS | Status: DC | PRN
  Administered 2022-09-19: 125 ug/kg/min via INTRAVENOUS

## 2022-09-19 NOTE — Transfer of Care (Signed)
Immediate Anesthesia Transfer of Care Note  Patient: Marie Clarke  Procedure(s) Performed: ESOPHAGOGASTRODUODENOSCOPY (EGD) WITH PROPOFOL BIOPSY  Patient Location: PACU  Anesthesia Type:General  Level of Consciousness: drowsy, patient cooperative, and responds to stimulation  Airway & Oxygen Therapy: Patient Spontanous Breathing  Post-op Assessment: Report given to RN and Post -op Vital signs reviewed and stable  Post vital signs: Reviewed and stable  Last Vitals:  Vitals Value Taken Time  BP    Temp 35.9 C 09/19/22 0939  Pulse 84 09/19/22 0939  Resp 16 09/19/22 0939  SpO2 99 % 09/19/22 0939  Vitals shown include unfiled device data.  Last Pain:  Vitals:   09/19/22 0939  TempSrc: Temporal  PainSc: Asleep         Complications: No notable events documented.

## 2022-09-19 NOTE — Anesthesia Preprocedure Evaluation (Signed)
Anesthesia Evaluation  Patient identified by MRN, date of birth, ID band Patient awake    Reviewed: Allergy & Precautions, NPO status , Patient's Chart, lab work & pertinent test results  History of Anesthesia Complications Negative for: history of anesthetic complications  Airway Mallampati: II  TM Distance: >3 FB Neck ROM: Full    Dental  (+) Edentulous Upper, Edentulous Lower   Pulmonary asthma , sleep apnea , COPD,  COPD inhaler, Patient abstained from smoking.Not current smoker, former smoker   Pulmonary exam normal breath sounds clear to auscultation       Cardiovascular Exercise Tolerance: Poor METShypertension, + angina  + CAD, + Past MI and +CHF  (-) dysrhythmias  Rhythm:Regular Rate:Normal - Systolic murmurs    Neuro/Psych  Headaches, Seizures -,  PSYCHIATRIC DISORDERS Anxiety Depression Bipolar Disorder   CVA, No Residual Symptoms    GI/Hepatic ,neg GERD  ,,(+)     (-) substance abuse    Endo/Other  diabetesHypothyroidism  Off her ozempic for 7 days. Denies GI symptoms today  Renal/GU negative Renal ROS     Musculoskeletal  (+)  Fibromyalgia -  Abdominal   Peds  Hematology   Anesthesia Other Findings Past Medical History: No date: Anemia No date: Anginal pain (HCC) No date: Anxiety No date: Arthritis No date: Asthma No date: Bilateral lower extremity edema No date: Bipolar disorder (HCC) unk: CAD (coronary artery disease) No date: CHF (congestive heart failure) (HCC) No date: Chronic pain syndrome No date: COPD (chronic obstructive pulmonary disease) (HCC)     Comment:  emphysema No date: DDD (degenerative disc disease), lumbar unk: Depression No date: Diabetes mellitus without complication (HCC) No date: Diabetes mellitus, type II (HCC) 2015: Drug overdose     Comment:  after dad died No date: Dysrhythmia     Comment:  history of tachy arrythmias No date: Family history of adverse  reaction to anesthesia     Comment:  sister had shortness of breath after surgery No date: Fibromyalgia No date: GERD (gastroesophageal reflux disease) No date: Headache     Comment:  chronic migraines No date: Hyperlipidemia No date: Hypertension No date: Hypothyroidism 04/29/2014: Left leg pain 2007: MI (myocardial infarction) (HCC) 09/16/2014: Muscle ache No date: Osteoporosis No date: Overactive bladder unk: Pancreatitis No date: Panic attacks No date: PTSD (post-traumatic stress disorder) No date: Reflex sympathetic dystrophy No date: Renal cyst, left No date: Renal insufficiency     Comment:  ckd stage iii No date: Restless legs syndrome 06/2019: Sleep apnea     Comment:  uses cpap No date: Stage 3a chronic kidney disease (HCC) 2010: Stroke (HCC)     Comment:  TIA x 2. no residual deficits No date: Suicide attempt [facility])     Comment:  after father died. No date: Thyroid disease     Comment:  thyroid nodule unk: TIA (transient ischemic attack) No date: TIA (transient ischemic attack)  Reproductive/Obstetrics                             Anesthesia Physical Anesthesia Plan  ASA: 3  Anesthesia Plan: General   Post-op Pain Management: Minimal or no pain anticipated   Induction: Intravenous  PONV Risk Score and Plan: 3 and Propofol infusion, TIVA and Ondansetron  Airway Management Planned: Nasal Cannula  Additional Equipment: None  Intra-op Plan:   Post-operative Plan:   Informed Consent: I have reviewed the patients History and Physical, chart, labs and discussed the  procedure including the risks, benefits and alternatives for the proposed anesthesia with the patient or authorized representative who has indicated his/her understanding and acceptance.     Dental advisory given  Plan Discussed with: CRNA and Surgeon  Anesthesia Plan Comments: (Discussed risks of anesthesia with patient, including possibility of difficulty with  spontaneous ventilation under anesthesia necessitating airway intervention, PONV, and rare risks such as cardiac or respiratory or neurological events, and allergic reactions. Discussed the role of CRNA in patient's perioperative care. Patient understands.)       Anesthesia Quick Evaluation

## 2022-09-19 NOTE — H&P (Signed)
Outpatient short stay form Pre-procedure 09/19/2022 9:19 AM  K. Norma Fredrickson, M.D.  Primary Physician: Olena Leatherwood, FNP  Reason for visit:  GERD, failed medical therapy, NSAID use  History of present illness:  52 y/o patient with hx of GERD, excessive NSAID use, resistant to PPI therapy. No overt dysphagia, hemetemesis, weight loss.Had colonoscopy at [facility] in 2020 and given 5 year interval recommendation.    Current Facility-Administered Medications:    0.9 %  sodium chloride infusion, , Intravenous, Continuous, [address], Boykin Nearing, MD, Last Rate: 20 mL/hr at 09/19/22 0900, New Bag at 09/19/22 0900  Medications Prior to Admission  Medication Sig Dispense Refill Last Dose   aspirin 81 MG chewable tablet Chew 1 tablet (81 mg total) by mouth 2 (two) times daily. 60 tablet 0 09/18/2022   atorvastatin (LIPITOR) 40 MG tablet Take 40 mg by mouth at bedtime.   09/18/2022   clonazepam (KLONOPIN) 0.125 MG disintegrating tablet Take 0.125 mg by mouth 3 (three) times daily.   09/18/2022   levothyroxine (SYNTHROID) 200 MCG tablet Take 200 mcg by mouth daily before breakfast. 1 tablet everyday except for Sunday; only take 1/2 tablet (100 mcg) on Sunday.   09/18/2022   metoprolol succinate (TOPROL-XL) 50 MG 24 hr tablet Take 75 mg by mouth 2 (two) times daily.   09/19/2022   montelukast (SINGULAIR) 10 MG tablet Take 10 mg by mouth at bedtime.   09/18/2022   oxycodone-acetaminophen (LYNOX) 5-300 MG tablet Take 1 tablet by mouth in the morning and at bedtime.   09/18/2022   rOPINIRole (REQUIP) 1 MG tablet Take 1 mg by mouth 3 (three) times daily.   09/18/2022   Semaglutide,0.25 or 0.5MG /DOS, (OZEMPIC, 0.25 OR 0.5 MG/DOSE,) 2 MG/1.5ML SOPN Inject into the skin once a week.   09/11/2022   stavudine (ZERIT) 20 MG capsule Take 20 mg by mouth 2 (two) times daily.      albuterol (PROVENTIL) (2.5 MG/3ML) 0.083% nebulizer solution Take 2.5 mg by nebulization every 6 (six) hours as needed for wheezing or shortness of breath.       cariprazine (VRAYLAR) 3 MG capsule Take 3 mg by mouth at bedtime.      cholecalciferol (VITAMIN D3) 25 MCG (1000 UNIT) tablet Take 1,000 Units by mouth daily.      cyanocobalamin 1000 MCG tablet Take 1,000 mcg by mouth daily.      diclofenac (VOLTAREN) 75 MG EC tablet Take 75 mg by mouth 2 (two) times daily.      dicyclomine (BENTYL) 10 MG capsule Take 10 mg by mouth 3 (three) times daily as needed for spasms.      docusate sodium (COLACE) 100 MG capsule Take 1 capsule (100 mg total) by mouth 2 (two) times daily. 30 capsule 0    empagliflozin (JARDIANCE) 10 MG TABS tablet Take 10 mg by mouth daily.      esomeprazole (NEXIUM) 40 MG capsule Take 40 mg by mouth at bedtime.      febuxostat (ULORIC) 40 MG tablet Take 40 mg by mouth daily.      furosemide (LASIX) 80 MG tablet Take 80 mg by mouth 2 (two) times daily.      gabapentin (NEURONTIN) 600 MG tablet Take 800 mg by mouth 3 (three) times daily.      HYDROcodone-acetaminophen (NORCO) 7.5-325 MG tablet Take 1-2 tablets by mouth every 4 (four) hours as needed for severe pain or moderate pain (pain score 7-10). 60 tablet 0    levocetirizine (XYZAL) 5 MG tablet  Take 5 mg by mouth every evening.      meclizine (ANTIVERT) 25 MG tablet Take 1 tablet (25 mg total) by mouth 3 (three) times daily as needed for dizziness. 20 tablet 0    Multiple Vitamin (MULTIVITAMIN) tablet Take 1 tablet by mouth daily.      Multiple Vitamins-Iron (MULTIPLE VITAMIN/IRON PO) Take 1 tablet by mouth daily.      Omega-3 Fatty Acids (FISH OIL) 1000 MG CAPS Take 1 capsule by mouth daily.      ondansetron (ZOFRAN-ODT) 4 MG disintegrating tablet Take 4 mg by mouth every 8 (eight) hours as needed for nausea or vomiting.      potassium chloride SA (KLOR-CON) 20 MEQ tablet Take 40 mEq by mouth 2 (two) times daily.      PROAIR HFA 108 (90 Base) MCG/ACT inhaler Inhale 2 puffs into the lungs every 6 (six) hours as needed for wheezing or shortness of breath.      rizatriptan (MAXALT) 10  MG tablet Take 10 mg by mouth as needed for migraine.      solifenacin (VESICARE) 5 MG tablet Take 5 mg by mouth daily.      SYMBICORT 160-4.5 MCG/ACT inhaler Inhale 2 puffs into the lungs 2 (two) times daily.      temazepam (RESTORIL) 7.5 MG capsule Take 7.5 mg by mouth at bedtime as needed for sleep.      topiramate (TOPAMAX) 50 MG tablet Take 50 mg by mouth 2 (two) times daily.      vitamin C (ASCORBIC ACID) 500 MG tablet Take 500 mg by mouth daily.      vitamin E 180 MG (400 UNITS) capsule Take 400 Units by mouth daily.        Allergies  Allergen Reactions   Diazepam Hives and Nausea And Vomiting   Ziprasidone Hcl Itching and Other (See Comments)    Confusion    Azithromycin Hives   Divalproex Sodium Hives   Levofloxacin Hives and Rash    1/31: would like to retry since not taking valium   Lisinopril Other (See Comments)    Medicine cause kidney failure   Metronidazole Hives    1/31: would like to retry since she is no longer taking valium   Sulfa Antibiotics Hives   Valproic Acid Itching and Rash   Cephalexin Hives   Ciprofloxacin Hives   Doxycycline Rash   Penicillins Hives    Has patient had a PCN reaction causing immediate rash, facial/tongue/throat swelling, SOB or lightheadedness with hypotension: No Has patient had a PCN reaction causing severe rash involving mucus membranes or skin necrosis: No Has patient had a PCN reaction that required hospitalization: No Has patient had a PCN reaction occurring within the last 10 years: No If all of the above answers are "NO", then may proceed with Cephalosporin use.      Past Medical History:  Diagnosis Date   Anemia    Anginal pain (HCC)    Anxiety    Arthritis    Asthma    Bilateral lower extremity edema    Bipolar disorder (HCC)    CAD (coronary artery disease) unk   CHF (congestive heart failure) (HCC)    Chronic pain syndrome    COPD (chronic obstructive pulmonary disease) (HCC)    emphysema   DDD  (degenerative disc disease), lumbar    Depression unk   Diabetes mellitus without complication (HCC)    Diabetes mellitus, type II (HCC)    Drug overdose 2015  after dad died   Dysrhythmia    history of tachy arrythmias   Family history of adverse reaction to anesthesia    sister had shortness of breath after surgery   Fibromyalgia    GERD (gastroesophageal reflux disease)    Headache    chronic migraines   Hyperlipidemia    Hypertension    Hypothyroidism    Left leg pain 04/29/2014   MI (myocardial infarction) (HCC) 2007   Muscle ache 09/16/2014   Osteoporosis    Overactive bladder    Pancreatitis unk   Panic attacks    PTSD (post-traumatic stress disorder)    Reflex sympathetic dystrophy    Renal cyst, left    Renal insufficiency    ckd stage iii   Restless legs syndrome    Sleep apnea 06/2019   uses cpap   Stage 3a chronic kidney disease (HCC)    Stroke (HCC) 2010   TIA x 2. no residual deficits   Suicide attempt [facility])    after father died.   Thyroid disease    thyroid nodule   TIA (transient ischemic attack) unk   TIA (transient ischemic attack)     Review of systems:  Otherwise negative.    Physical Exam  Gen: Alert, oriented. Appears stated age.  HEENT: [place]/AT. PERRLA. Lungs: CTA, no wheezes. CV: RR nl S1, S2. Abd: soft, benign, no masses. BS+ Ext: No edema. Pulses 2+    Planned procedures: Proceed with Esophagogastroduodenoscopy. The patient understands the nature of the planned procedure, indications, risks, alternatives and potential complications including but not limited to bleeding, infection, perforation, damage to internal organs and possible oversedation/side effects from anesthesia. The patient agrees and gives consent to proceed.  Please refer to procedure notes for findings, recommendations and patient disposition/instructions.      K. Norma Fredrickson, M.D. Gastroenterology 09/19/2022  9:19 AM

## 2022-09-19 NOTE — Anesthesia Postprocedure Evaluation (Signed)
Anesthesia Post Note  Patient: [facility]  Procedure(s) Performed: ESOPHAGOGASTRODUODENOSCOPY (EGD) WITH PROPOFOL BIOPSY  Patient location during evaluation: Endoscopy Anesthesia Type: General Level of consciousness: awake and alert Pain management: pain level controlled Vital Signs Assessment: post-procedure vital signs reviewed and stable Respiratory status: spontaneous breathing, nonlabored ventilation, respiratory function stable and patient connected to nasal cannula oxygen Cardiovascular status: blood pressure returned to baseline and stable Postop Assessment: no apparent nausea or vomiting Anesthetic complications: no   No notable events documented.   Last Vitals:  Vitals:   09/19/22 0939 09/19/22 0949  BP: 91/68 99/70  Pulse:    Resp: 16   Temp: (!) 35.9 C   SpO2:      Last Pain:  Vitals:   09/19/22 0959  TempSrc:   PainSc: 0-No pain                 Corinda Gubler

## 2022-09-19 NOTE — Op Note (Signed)
[facility] Gastroenterology Patient Name: Marie Clarke Procedure Date: 09/19/2022 9:19 AM MRN: [id number] Account #: [id number] Date of Birth: 1971/02/07 Admit Type: Outpatient Age: 52 Room: [facility] ENDO ROOM 2 Gender: Female Note Status: Finalized Instrument Name: Patton Salles Endoscope [id number] Procedure:             Upper GI endoscopy Indications:           Gastro-esophageal reflux disease, Failure to respond                         to medical treatment Providers:             Boykin Nearing. Norma Fredrickson MD, MD Referring MD:          Latrelle Dodrill. Nile Dear (Referring MD) Medicines:             Propofol per Anesthesia Complications:         No immediate complications. Estimated blood loss:                         Minimal. Procedure:             Pre-Anesthesia Assessment:                        - The risks and benefits of the procedure and the                         sedation options and risks were discussed with the                         patient. All questions were answered and informed                         consent was obtained.                        - Patient identification and proposed procedure were                         verified prior to the procedure by the nurse. The                         procedure was verified in the procedure room.                        - ASA Grade Assessment: II - A patient with mild                         systemic disease.                        - After reviewing the risks and benefits, the patient                         was deemed in satisfactory condition to undergo the                         procedure.                        After obtaining informed consent,  the endoscope was                         passed under direct vision. Throughout the procedure,                         the patient's blood pressure, pulse, and oxygen                         saturations were monitored continuously. The Endoscope                         was introduced through  the mouth, and advanced to the                         third part of duodenum. The upper GI endoscopy was                         accomplished without difficulty. The patient tolerated                         the procedure well. Findings:      The examined esophagus was normal. Biopsies were obtained from the       proximal and distal esophagus with cold forceps for histology of       suspected eosinophilic esophagitis.      The stomach was normal.      The examined duodenum was normal. Impression:            - Normal esophagus.                        - Normal stomach.                        - Normal examined duodenum.                        - Biopsies were taken with a cold forceps for                         evaluation of eosinophilic esophagitis. Recommendation:        - Patient has a contact number available for                         emergencies. The signs and symptoms of potential                         delayed complications were discussed with the patient.                         Return to normal activities tomorrow. Written                         discharge instructions were provided to the patient.                        - Resume previous diet.                        - Continue present medications.                        -  No aspirin, ibuprofen, naproxen, or other                         non-steroidal anti-inflammatory drugs.                        - Await pathology results.                        - Follow up with Tawni Pummel, PA-C at [facility]astroenter[facility]. [phone].                        - Telephone GI office to schedule appointment in 3                         months.                        - The findings and recommendations were discussed with                         the patient. Procedure Code(s):     --- Professional ---                        [phone], Esophagogastroduodenoscopy, flexible,                         transoral;  with biopsy, single or multiple Diagnosis Code(s):     --- Professional ---                        K21.9, Gastro-esophageal reflux disease without                         esophagitis CPT copyright 2022 American Medical Association. All rights reserved. The codes documented in this report are preliminary and upon coder review may  be revised to meet current compliance requirements. Stanton Kidney MD, MD 09/19/2022 9:41:00 AM This report has been signed electronically. Number of Addenda: 0 Note Initiated On: 09/19/2022 9:19 AM Estimated Blood Loss:  Estimated blood loss was minimal. Estimated blood loss                         was minimal.      [facility]

## 2022-09-20 ENCOUNTER — Encounter: Payer: Self-pay | Admitting: Internal Medicine

## 2023-03-20 ENCOUNTER — Encounter: Payer: Self-pay | Admitting: Dermatology

## 2023-03-20 ENCOUNTER — Ambulatory Visit: Payer: 59 | Admitting: Dermatology

## 2023-03-20 DIAGNOSIS — D229 Melanocytic nevi, unspecified: Secondary | ICD-10-CM | POA: Diagnosis not present

## 2023-03-20 DIAGNOSIS — D2239 Melanocytic nevi of other parts of face: Secondary | ICD-10-CM

## 2023-03-20 DIAGNOSIS — D2339 Other benign neoplasm of skin of other parts of face: Secondary | ICD-10-CM

## 2023-03-20 DIAGNOSIS — Z808 Family history of malignant neoplasm of other organs or systems: Secondary | ICD-10-CM

## 2023-03-20 NOTE — Patient Instructions (Signed)

## 2023-03-20 NOTE — Progress Notes (Signed)
   New Patient Visit   Subjective  Marie Clarke is a 53 y.o. female who presents for the following: a few spots at face that itch and get irritated, patient would like removed.   Patient had a cousin that passed away with melanoma.   The patient has spots, moles and lesions to be evaluated, some may be new or changing and the patient may have concern these could be cancer.   The following portions of the chart were reviewed this encounter and updated as appropriate: medications, allergies, medical history  Review of Systems:  No other skin or systemic complaints except as noted in HPI or Assessment and Plan.  Objective  Well appearing patient in no apparent distress; mood and affect are within normal limits.   A focused examination was performed of the following areas: face  Relevant exam findings are noted in the Assessment and Plan.  Right Cheek 0.6 cm flesh colored pigmented papule Right Ala 0.5 cm flesh colored pigmented papule   Assessment & Plan     MELANOCYTIC NEVI - compound nevi Exam: Tan-brown and/or pink-flesh-colored symmetric macules and papules  Treatment Plan: Benign appearing on exam today. Recommend observation. Call clinic for new or changing moles. Recommend daily use of broad spectrum spf 30+ sunscreen to sun-exposed areas.   Patient defers referral to plastic surgery.   MULTIPLE BENIGN NEVI (2) Right Ala, Right Cheek Patient advised we can not remove entirely, we can only remove the raised portion. Nevi will be present in the base and may become raised again. For complete removal, we would refer to plastic surgery for excision and repair with stitches. Shave removal will leave a scar. Scar may become atrophic or contract and cause indentation of skin. Patient wants to proceed knowing this. Epidermal / dermal shaving - Right Cheek  Lesion diameter (cm):  0.6 Informed consent: discussed and consent obtained   Timeout: patient name, date of birth,  surgical site, and procedure verified   Procedure prep:  Patient was prepped and draped in usual sterile fashion Prep type:  Povidone-iodine  and isopropyl alcohol Anesthesia: the lesion was anesthetized in a standard fashion   Anesthetic:  1% lidocaine  w/ epinephrine  1-100,000 buffered w/ 8.4% NaHCO3 Instrument used: DermaBlade   Hemostasis achieved with: pressure and aluminum chloride   Outcome: patient tolerated procedure well   Post-procedure details: wound care instructions given    Epidermal / dermal shaving - Right Ala  Lesion diameter (cm):  0.5 Informed consent: discussed and consent obtained   Timeout: patient name, date of birth, surgical site, and procedure verified   Procedure prep:  Patient was prepped and draped in usual sterile fashion Prep type:  Povidone-iodine  and isopropyl alcohol Anesthesia: the lesion was anesthetized in a standard fashion   Anesthetic:  1% lidocaine  w/ epinephrine  1-100,000 buffered w/ 8.4% NaHCO3 Instrument used: DermaBlade   Hemostasis achieved with: pressure and aluminum chloride   Outcome: patient tolerated procedure well   Post-procedure details: wound care instructions given   Specimen 1 - Surgical pathology Differential Diagnosis: Nevus  Check Margins: No 0.6 cm flesh colored pigmented papule  Specimen 2 - Surgical pathology Differential Diagnosis: Nevus  Check Margins: No 0.5 cm flesh colored pigmented papule  Return if symptoms worsen or fail to improve.  LILLETTE Lonell Drones, RMA, am acting as scribe for Boneta Sharps, MD .   Documentation: I have reviewed the above documentation for accuracy and completeness, and I agree with the above.  Boneta Sharps, MD

## 2023-03-25 ENCOUNTER — Encounter: Payer: Self-pay | Admitting: Dermatology

## 2023-03-25 LAB — SURGICAL PATHOLOGY

## 2023-04-30 ENCOUNTER — Other Ambulatory Visit: Payer: Self-pay | Admitting: *Deleted

## 2023-04-30 ENCOUNTER — Telehealth: Payer: Self-pay | Admitting: *Deleted

## 2023-04-30 DIAGNOSIS — Z122 Encounter for screening for malignant neoplasm of respiratory organs: Secondary | ICD-10-CM

## 2023-04-30 DIAGNOSIS — Z87891 Personal history of nicotine dependence: Secondary | ICD-10-CM

## 2023-04-30 NOTE — Telephone Encounter (Signed)
 Lung Cancer Screening Narrative/Criteria Questionnaire (Cigarette Smokers Only- No Cigars/Pipes/vapes)   Marie Clarke   SDMV:05/23/23 9:45- [place]                                           Jan 29, 1971              LDCT: 05/24/23 9:30- [facility]    52 y.o.   Phone: [phone]  Lung Screening Narrative (confirm age 65-77 yrs Medicare / 50-80 yrs Private pay insurance)   Insurance information:[employer] MCR/ MCD   Referring Provider:[place]   This screening involves an initial phone call with a team member from our program. It is called a shared decision making visit. The initial meeting is required by insurance and Medicare to make sure you understand the program. This appointment takes about 15-20 minutes to complete. The CT scan will completed at a separate date/time. This scan takes about 5-10 minutes to complete and you may eat and drink before and after the scan.  Criteria questions for Lung Cancer Screening:   Are you a current or former smoker? Former Age began smoking: 15   If you are a former smoker, what year did you quit smoking? 2020(within 15 yrs)   To calculate your smoking history, I need an accurate estimate of how many packs of cigarettes you smoked per day and for how many years. (Not just the number of PPD you are now smoking)   Years smoking 32  x Packs per day 2.5 = Pack years 80   (at least 20 pack yrs)   (Make sure they understand that we need to know how much they have smoked in the past, not just the number of PPD they are smoking now)  Do you have a personal history of cancer?  No    Do you have a family history of cancer? Yes  (cancer type and and relative) GM (Breast) Great Aunt (Breast) Uncle (Colon)  Are you coughing up blood?  No  Have you had unexplained weight loss of 15 lbs or more in the last 6 months? No  It looks like you meet all criteria.     Additional information: N/A

## 2023-05-07 ENCOUNTER — Ambulatory Visit (INDEPENDENT_AMBULATORY_CARE_PROVIDER_SITE_OTHER): Payer: 59 | Admitting: Dermatology

## 2023-05-07 ENCOUNTER — Encounter: Payer: Self-pay | Admitting: Dermatology

## 2023-05-07 DIAGNOSIS — L820 Inflamed seborrheic keratosis: Secondary | ICD-10-CM | POA: Diagnosis not present

## 2023-05-07 DIAGNOSIS — D492 Neoplasm of unspecified behavior of bone, soft tissue, and skin: Secondary | ICD-10-CM | POA: Diagnosis not present

## 2023-05-07 DIAGNOSIS — D485 Neoplasm of uncertain behavior of skin: Secondary | ICD-10-CM

## 2023-05-07 DIAGNOSIS — D224 Melanocytic nevi of scalp and neck: Secondary | ICD-10-CM

## 2023-05-07 NOTE — Progress Notes (Unsigned)
 Follow-Up Visit   Subjective  Marie Clarke is a 53 y.o. female who presents for the following: would like to have a spots removed, 1 at chin, 2 at left shoulder.  The patient has spots, moles and lesions to be evaluated, some may be new or changing and the patient may have concern these could be cancer.   The following portions of the chart were reviewed this encounter and updated as appropriate: medications, allergies, medical history  Review of Systems:  No other skin or systemic complaints except as noted in HPI or Assessment and Plan.  Objective  Well appearing patient in no apparent distress; mood and affect are within normal limits.   A focused examination was performed of the following areas: Chin, neck, shoulder  Relevant exam findings are noted in the Assessment and Plan.  right submental 3 mm pigmented papule Nevus  Left Supraclavicular lateral 8 mm keratotic papule SK  Left Supraclavicular medial 5 mm keratotic papule SK  Assessment & Plan     NEOPLASM OF UNCERTAIN BEHAVIOR OF SKIN (3) right submental Skin / nail biopsy Type of biopsy: tangential   Informed consent: discussed and consent obtained   Timeout: patient name, date of birth, surgical site, and procedure verified   Procedure prep:  Patient was prepped and draped in usual sterile fashion Prep type:  Isopropyl alcohol Anesthesia: the lesion was anesthetized in a standard fashion   Anesthetic:  1% lidocaine w/ epinephrine 1-100,000 buffered w/ 8.4% NaHCO3 Instrument used: DermaBlade   Hemostasis achieved with: pressure and aluminum chloride   Outcome: patient tolerated procedure well   Post-procedure details: sterile dressing applied and wound care instructions given   Dressing type: bandage and petrolatum   Specimen 1 - Surgical pathology Differential Diagnosis: Nevus  Check Margins: No 3 mm pigmented papule  Left Supraclavicular lateral Skin / nail biopsy Type of biopsy: tangential    Informed consent: discussed and consent obtained   Timeout: patient name, date of birth, surgical site, and procedure verified   Procedure prep:  Patient was prepped and draped in usual sterile fashion Prep type:  Isopropyl alcohol Anesthesia: the lesion was anesthetized in a standard fashion   Anesthetic:  1% lidocaine w/ epinephrine 1-100,000 buffered w/ 8.4% NaHCO3 Instrument used: DermaBlade   Hemostasis achieved with: pressure and aluminum chloride   Outcome: patient tolerated procedure well   Post-procedure details: sterile dressing applied and wound care instructions given   Dressing type: bandage and petrolatum   Specimen 2 - Surgical pathology Differential Diagnosis: SK  Check Margins: No 8 mm keratotic papule  Left Supraclavicular medial Skin / nail biopsy Type of biopsy: tangential   Informed consent: discussed and consent obtained   Timeout: patient name, date of birth, surgical site, and procedure verified   Procedure prep:  Patient was prepped and draped in usual sterile fashion Prep type:  Isopropyl alcohol Anesthesia: the lesion was anesthetized in a standard fashion   Anesthetic:  1% lidocaine w/ epinephrine 1-100,000 buffered w/ 8.4% NaHCO3 Instrument used: DermaBlade   Hemostasis achieved with: pressure and aluminum chloride   Outcome: patient tolerated procedure well   Post-procedure details: sterile dressing applied and wound care instructions given   Dressing type: bandage and petrolatum   Specimen 3 - Surgical pathology Differential Diagnosis: SK  Check Margins: No 5 mm keratotic papule   Return if symptoms worsen or fail to improve.  Anise Salvo, RMA, am acting as scribe for Elie Goody, MD .   Documentation: I have  reviewed the above documentation for accuracy and completeness, and I agree with the above.  Elie Goody, MD

## 2023-05-07 NOTE — Patient Instructions (Signed)

## 2023-05-08 ENCOUNTER — Encounter: Payer: Self-pay | Admitting: Dermatology

## 2023-05-09 ENCOUNTER — Encounter: Payer: Self-pay | Admitting: Dermatology

## 2023-05-09 LAB — SURGICAL PATHOLOGY

## 2023-05-23 ENCOUNTER — Encounter: Payer: Self-pay | Admitting: Adult Health

## 2023-05-23 ENCOUNTER — Ambulatory Visit: Admitting: Adult Health

## 2023-05-23 DIAGNOSIS — Z87891 Personal history of nicotine dependence: Secondary | ICD-10-CM | POA: Diagnosis not present

## 2023-05-23 NOTE — Progress Notes (Signed)
  Virtual Visit via Telephone Note  I connected with Marie Clarke , 05/23/23 9:49 AM by a telemedicine application and verified that I am speaking with the correct person using two identifiers.  Location: Patient: home Provider: home   I discussed the limitations of evaluation and management by telemedicine and the availability of in person appointments. The patient expressed understanding and agreed to proceed.   Shared Decision Making Visit Lung Cancer Screening Program [id number])   Eligibility: 53 y.o. Pack Years Smoking History Calculation = 80 pack years  (# packs/per year x # years smoked) Recent History of coughing up blood  no Unexplained weight loss? no ( >Than 15 pounds within the last 6 months ) Prior History Lung / other cancer no (Diagnosis within the last 5 years already requiring surveillance chest CT Scans). Smoking Status Former Smoker Former Smokers: Years since quit: 5 years  Quit Date: 2020  Visit Components: Discussion included one or more decision making aids. YES Discussion included risk/benefits of screening. YES Discussion included potential follow up diagnostic testing for abnormal scans. YES Discussion included meaning and risk of over diagnosis. YES Discussion included meaning and risk of False Positives. YES Discussion included meaning of total radiation exposure. YES  Counseling Included: Importance of adherence to annual lung cancer LDCT screening. YES Impact of comorbidities on ability to participate in the program. YES Ability and willingness to under diagnostic treatment. YES  Smoking Cessation Counseling: Former Smokers:  Discussed the importance of maintaining cigarette abstinence. yes Diagnosis Code: Personal History of Nicotine Dependence. N56.213 Information about tobacco cessation classes and interventions provided to patient. Yes Patient provided with "ticket" for LDCT Scan. yes Written Order for Lung Cancer Screening with LDCT placed  in Epic. Yes (CT Chest Lung Cancer Screening Low Dose W/O CM) [id number]  Z12.2-Screening of respiratory organs Z87.891-Personal history of nicotine dependence   [name] 05/23/23

## 2023-05-23 NOTE — Patient Instructions (Signed)

## 2023-05-24 ENCOUNTER — Ambulatory Visit
Admission: RE | Admit: 2023-05-24 | Discharge: 2023-05-24 | Disposition: A | Discharge status: Home / Self Care | Source: Ambulatory Visit | Attending: Acute Care | Admitting: Acute Care

## 2023-05-24 DIAGNOSIS — Z87891 Personal history of nicotine dependence: Secondary | ICD-10-CM | POA: Diagnosis present

## 2023-05-24 DIAGNOSIS — Z122 Encounter for screening for malignant neoplasm of respiratory organs: Secondary | ICD-10-CM | POA: Diagnosis present

## 2023-06-24 ENCOUNTER — Other Ambulatory Visit: Payer: Self-pay

## 2023-06-24 DIAGNOSIS — Z122 Encounter for screening for malignant neoplasm of respiratory organs: Secondary | ICD-10-CM

## 2023-06-24 DIAGNOSIS — Z87891 Personal history of nicotine dependence: Secondary | ICD-10-CM

## 2023-07-22 ENCOUNTER — Emergency Department

## 2023-07-22 ENCOUNTER — Emergency Department: Admission: EM | Admit: 2023-07-22 | Discharge: 2023-07-22 | Disposition: A | Discharge status: Home / Self Care

## 2023-07-22 ENCOUNTER — Other Ambulatory Visit: Payer: Self-pay

## 2023-07-22 DIAGNOSIS — T782XXA Anaphylactic shock, unspecified, initial encounter: Secondary | ICD-10-CM | POA: Diagnosis not present

## 2023-07-22 DIAGNOSIS — E1122 Type 2 diabetes mellitus with diabetic chronic kidney disease: Secondary | ICD-10-CM | POA: Insufficient documentation

## 2023-07-22 DIAGNOSIS — T7840XA Allergy, unspecified, initial encounter: Secondary | ICD-10-CM | POA: Diagnosis present

## 2023-07-22 DIAGNOSIS — J449 Chronic obstructive pulmonary disease, unspecified: Secondary | ICD-10-CM | POA: Insufficient documentation

## 2023-07-22 DIAGNOSIS — N189 Chronic kidney disease, unspecified: Secondary | ICD-10-CM | POA: Insufficient documentation

## 2023-07-22 DIAGNOSIS — I130 Hypertensive heart and chronic kidney disease with heart failure and stage 1 through stage 4 chronic kidney disease, or unspecified chronic kidney disease: Secondary | ICD-10-CM | POA: Diagnosis not present

## 2023-07-22 DIAGNOSIS — I509 Heart failure, unspecified: Secondary | ICD-10-CM | POA: Insufficient documentation

## 2023-07-22 LAB — BASIC METABOLIC PANEL WITH GFR
Anion gap: 14 (ref 5–15)
BUN: 31 mg/dL — ABNORMAL HIGH (ref 6–20)
CO2: 20 mmol/L — ABNORMAL LOW (ref 22–32)
Calcium: 9 mg/dL (ref 8.9–10.3)
Chloride: 105 mmol/L (ref 98–111)
Creatinine, Ser: 1.11 mg/dL — ABNORMAL HIGH (ref 0.44–1.00)
GFR, Estimated: >60 mL/min (ref >60)
Glucose, Bld: 285 mg/dL — ABNORMAL HIGH (ref 70–99)
Potassium: 3.7 mmol/L (ref 3.5–5.1)
Sodium: 139 mmol/L (ref 135–145)

## 2023-07-22 LAB — CBC WITH DIFFERENTIAL/PLATELET
Abs Immature Granulocytes: 0.1 10*3/uL — ABNORMAL HIGH (ref 0.00–0.07)
Basophils Absolute: 0 10*3/uL (ref 0.0–0.1)
Basophils Relative: 0 %
Eosinophils Absolute: 0.1 10*3/uL (ref 0.0–0.5)
Eosinophils Relative: 1 %
HCT: 47.4 % — ABNORMAL HIGH (ref 36.0–46.0)
Hemoglobin: 14.9 g/dL (ref 12.0–15.0)
Immature Granulocytes: 1 %
Lymphocytes Relative: 8 %
Lymphs Abs: 1.3 10*3/uL (ref 0.7–4.0)
MCH: 28.3 pg (ref 26.0–34.0)
MCHC: 31.4 g/dL (ref 30.0–36.0)
MCV: 90.1 fL (ref 80.0–100.0)
Monocytes Absolute: 0.7 10*3/uL (ref 0.1–1.0)
Monocytes Relative: 5 %
Neutro Abs: 13.2 10*3/uL — ABNORMAL HIGH (ref 1.7–7.7)
Neutrophils Relative %: 85 %
Platelets: 291 10*3/uL (ref 150–400)
RBC: 5.26 MIL/uL — ABNORMAL HIGH (ref 3.87–5.11)
RDW: 14.5 % (ref 11.5–15.5)
WBC: 15.4 10*3/uL — ABNORMAL HIGH (ref 4.0–10.5)
nRBC: 0 % (ref 0.0–0.2)

## 2023-07-22 MED ORDER — FAMOTIDINE IN NACL 20-0.9 MG/50ML-% IV SOLN: 20.0000 mg | Freq: Once | INTRAVENOUS | Status: AC

## 2023-07-22 MED ORDER — EPINEPHRINE 0.3 MG/0.3ML IJ SOAJ
0.3000 mg | Freq: Once | INTRAMUSCULAR | Status: AC
  Administered 2023-07-22: 0.3 mg via INTRAMUSCULAR
  Filled 2023-07-22: qty 0.3

## 2023-07-22 MED ORDER — FAMOTIDINE IN NACL 20-0.9 MG/50ML-% IV SOLN
INTRAVENOUS | Status: AC
  Administered 2023-07-22: 20 mg via INTRAVENOUS
  Filled 2023-07-22: qty 50

## 2023-07-22 MED ORDER — ACETAMINOPHEN 500 MG PO TABS
1000.0000 mg | ORAL_TABLET | Freq: Once | ORAL | Status: AC
  Administered 2023-07-22: 1000 mg via ORAL
  Filled 2023-07-22: qty 2

## 2023-07-22 MED ORDER — METHYLPREDNISOLONE SODIUM SUCC 125 MG IJ SOLR: 125.0000 mg | Freq: Once | INTRAMUSCULAR | Status: AC

## 2023-07-22 MED ORDER — SODIUM CHLORIDE 0.9 % IV BOLUS
500.0000 mL | Freq: Once | INTRAVENOUS | Status: AC
  Administered 2023-07-22: 500 mL via INTRAVENOUS

## 2023-07-22 MED ORDER — EPINEPHRINE 0.3 MG/0.3ML IJ SOAJ: 0.3000 mg | INTRAMUSCULAR | 1 refills | Status: AC | PRN

## 2023-07-22 MED ORDER — SODIUM CHLORIDE 0.9 % IV BOLUS: 1000.0000 mL | Freq: Once | INTRAVENOUS | Status: DC

## 2023-07-22 MED ORDER — SODIUM CHLORIDE 0.9 % IV BOLUS
250.0000 mL | Freq: Once | INTRAVENOUS | Status: AC
  Administered 2023-07-22: 250 mL via INTRAVENOUS

## 2023-07-22 MED ORDER — METHYLPREDNISOLONE SODIUM SUCC 125 MG IJ SOLR
INTRAMUSCULAR | Status: AC
  Administered 2023-07-22: 125 mg via INTRAVENOUS
  Filled 2023-07-22: qty 2

## 2023-07-22 NOTE — ED Notes (Signed)
 Pt in bed, pt denies pain, pt states that she is feeling better, pt states that she is ready to go home, pt skin color is wdl, denies throat tightness, pt verbalized understanding d/c and follow up, advised to return for any concerns or worsening symptoms. Pt ambulatory from department.

## 2023-07-22 NOTE — ED Notes (Signed)
 Pt has decreased redness and hives, reports decreased itchiness.

## 2023-07-22 NOTE — ED Provider Notes (Addendum)
 [facility] Provider Note    Event Date/Time   First MD Initiated Contact with Patient 07/22/23 1220     (approximate)   History   Allergic Reaction  Pt to er room number one via ems, per ems pt had some solumedrol and Levaquin this am and is now having itchiness, and hives from head to toe, pt states that she feels like her throat is closing, md at bedside, IM epi given.    HPI Marie Clarke is a 53 y.o. female PMH CKD, hypertension, T2DM, COPD, CHF multiple allergies presents for evaluation of likely allergic reaction -Patient has a history of rash in response to levofloxacin, appears was seen in clinic earlier today and prescribed levofloxacin and prednisone  for bronchitis after telling provider that she had recently tolerated Levaquin -Patient states she took the Levaquin at about 9:40 AM, but around 10 AM she developed diffuse onset hives.  Also tells me she had a fainting episode at home and is feeling that her throat is closing while she is here in the emergency department and that she feels short of breath.  Tells me she has never received epinephrine  in the past. - No vomiting  Review of clinic results this morning with negative viral panel, negative strep test.  Received 50 mg IM Benadryl  from EMS en route.     Physical Exam   Triage Vital Signs: BP 114/66 (BP Location: Right Arm)   Pulse (!) 115   Temp 99.3 F (37.4 C) (Oral)   Resp 18   Ht 5\' 4"  (1.626 m)   Wt 93 kg   LMP  (LMP Unknown)   SpO2 100%   BMI 35.19 kg/m     Most recent vital signs: Vitals:   07/22/23 1300 07/22/23 1439  BP: 116/80 114/66  Pulse: (!) 105 (!) 115  Resp: 19 18  Temp:  99.3 F (37.4 C)  SpO2: 100% 100%     General: Awake, appears uncomfortable, + diffuse urticaria CV:  Good peripheral perfusion.  Mild tachycardia, regular rhythm HEENT: , Atraumatic, no intraoral swelling appreciated, no uvular edema, no stridor Resp:  Somewhat tachypneic,  CTAB Abd:  No distention. Nontender to deep palpation throughout   ED Results / Procedures / Treatments   Labs (all labs ordered are listed, but only abnormal results are displayed) Labs Reviewed  CBC WITH DIFFERENTIAL/PLATELET - Abnormal; Notable for the following components:      Result Value   WBC 15.4 (*)    RBC 5.26 (*)    HCT 47.4 (*)    Neutro Abs 13.2 (*)    Abs Immature Granulocytes 0.10 (*)    All other components within normal limits  BASIC METABOLIC PANEL WITH GFR - Abnormal; Notable for the following components:   CO2 20 (*)    Glucose, Bld 285 (*)    BUN 31 (*)    Creatinine, Ser 1.11 (*)    All other components within normal limits     EKG  See ED course.    RADIOLOGY Chest x-ray interpreted by myself and radiology report reviewed.  No acute pathology appreciated.    PROCEDURES:  Critical Care performed: Yes, see critical care procedure note(s)  .Critical Care  Performed by: Collis Deaner, MD Authorized by: Collis Deaner, MD   Critical care provider statement:    Critical care time (minutes):  30   Critical care time was exclusive of:  Separately billable procedures and treating other patients   Critical  care was necessary to treat or prevent imminent or life-threatening deterioration of the following conditions: anaphylaxis.   Critical care was time spent personally by me on the following activities:  Development of treatment plan with patient or surrogate, discussions with consultants, evaluation of patient's response to treatment, examination of patient, ordering and review of laboratory studies, ordering and review of radiographic studies, ordering and performing treatments and interventions, pulse oximetry, re-evaluation of patient's condition and review of old charts   I assumed direction of critical care for this patient from another provider in my specialty: no      MEDICATIONS ORDERED IN ED: Medications  EPINEPHrine  (EPI-PEN)  injection 0.3 mg (0.3 mg Intramuscular Given 07/22/23 1222)  methylPREDNISolone sodium succinate (SOLU-MEDROL) 125 mg/2 mL injection 125 mg (125 mg Intravenous Given 07/22/23 1232)  famotidine  (PEPCID ) IVPB 20 mg premix (0 mg Intravenous Stopped 07/22/23 1333)  sodium chloride  0.9 % bolus 500 mL (0 mLs Intravenous Stopped 07/22/23 1422)  sodium chloride  0.9 % bolus 250 mL (250 mLs Intravenous New Bag/Given 07/22/23 1439)  acetaminophen  (TYLENOL ) tablet 1,000 mg (1,000 mg Oral Given 07/22/23 1443)     IMPRESSION / MDM / ASSESSMENT AND PLAN / ED COURSE  I reviewed the triage vital signs and the nursing notes.                              DDX/MDM/AP: Differential diagnosis includes, but is not limited to, anaphylaxis.  With regard to recent cough and shortness of breath, may have underlying COPD exacerbation.  Suspect reported syncopal episode likely related to anaphylaxis, no evidence of any associated traumatic injuries.  Plan: - EpiPen  - Small bolus IV fluid - IV famotidine , methylprednisolone - Cardiac monitoring - Will continue to monitor closely  Patient's presentation is most consistent with acute presentation with potential threat to life or bodily function.  The patient is on the cardiac monitor to evaluate for evidence of arrhythmia and/or significant heart rate changes.  ED course below.  Workup unremarkable, patient responded very well to initial treatment of anaphylaxis and as of about 3 hours into her ED course has not had any recurrence of symptoms.  Signed out to oncoming ED provider pending further observation timeframe.  Does have some mild tachycardia here, treated with IV fluid.  Will plan to stop Levaquin and follow-up closely with primary care  provider, no indication for alternative antibiotic at this time.  Clinical Course as of 07/22/23 1522  Mon Jul 22, 2023  1241 Initial ecg = unclear rhythm, narrow complex tachycardia.  Does appear to be sinus in places, rate 104, no  gross ST elevation or depression, no significant repolarization abnormality.  Significant baseline artifact, will repeat. [MM]  1419 Patient reevaluated, feeling much better.  All throat tightness has resolved.  All rashes resolved.  No ongoing shortness of breath.  Family provides collateral.  Was with patient when she had this episode.  Notes she is complaining of shortness of breath and then attempted to walk to the bathroom, did lose consciousness, fell into a doorway and slumped to ground.  Unconscious for less than a minute.  Fell to carpeted ground.  Patient with no headache.  No external evidence of head trauma.  Will defer any head imaging at this time.  Remains somewhat tachycardic but tells me she has baseline tachycardia [MM]  1431 Chest x-ray interpreted by myself no acute pathology  Radiology report below: IMPRESSION: Low lung volumes without  evidence of acute or active cardiopulmonary disease.   [MM]    Clinical Course User Index [MM] Collis Deaner, MD     FINAL CLINICAL IMPRESSION(S) / ED DIAGNOSES   Final diagnoses:  Anaphylaxis, initial encounter     Rx / DC Orders   ED Discharge Orders          Ordered    EPINEPHrine  0.3 mg/0.3 mL IJ SOAJ injection  As needed        07/22/23 1522             Note:  This document was prepared using Dragon voice recognition software and may include unintentional dictation errors.   Collis Deaner, MD 07/22/23 1518    Collis Deaner, MD 07/22/23 [phone]

## 2023-07-22 NOTE — ED Notes (Signed)
 Pt ambulatory to and from the bathroom, pt skin color is normal, denies itchiness or feelings like her throat is closing. Resps even and unlabored

## 2023-07-22 NOTE — ED Provider Notes (Signed)
-----------------------------------------   3:07 PM on 07/22/2023 -----------------------------------------  Blood pressure 114/66, pulse (!) 115, temperature 99.3 F (37.4 C), temperature source Oral, resp. rate 18, height 5\' 4"  (1.626 m), weight 93 kg, SpO2 100%.  Assuming care from Dr. Cam Cava.  In short, Marie Clarke is a 53 y.o. female with a chief complaint of Allergic Reaction .  Refer to the original H&P for additional details.  The current plan of care is to observe for recurrence of anaphylaxis, plan for d/c if patient continues to do well.  ----------------------------------------- 4:57 PM on 07/22/2023 ----------------------------------------- Patient feels well on reassessment with no evidence of recurrent anaphylaxis, heart rate has improved.  She is appropriate for discharge home with outpatient follow-up, prescription provided for EpiPen  and she was counseled to stop Levaquin.  She was counseled to return to the ED for new or worsening symptoms, patient agrees with plan.      Twilla Galea, MD 07/22/23 [phone]

## 2023-07-22 NOTE — ED Triage Notes (Signed)
 Pt to er room number one via ems, per ems pt had some solumedrol and Levaquin this am and is now having itchiness, and hives from head to toe, pt states that she feels like her throat is closing, md at bedside, IM epi given.

## 2023-07-22 NOTE — Discharge Instructions (Addendum)
 You are treated in the emergency department for severe allergic reaction to the antibiotic that you started today. Please STOP the levofloxacin and do not take it again in the future.  You do not require further antibiotics at this time.  Please follow-up with your primary care provider for reevaluation, and return to the emergency department with any new or worsening symptoms.

## 2023-07-22 NOTE — ED Notes (Signed)
 Pt in bed, pt denies any lump or discomfort in her throat, skin color normal. Pt denies pain. Family at bedside.

## 2023-07-22 NOTE — ED Notes (Signed)
 Pt in bed, pt states that her throat feels better, but still feels like she has a big knot in her thoat, no swelling noted in lips or tongue, resps even and unlabored.

## 2023-08-29 DIAGNOSIS — E039 Hypothyroidism, unspecified: Secondary | ICD-10-CM | POA: Diagnosis not present

## 2023-08-29 DIAGNOSIS — E1121 Type 2 diabetes mellitus with diabetic nephropathy: Secondary | ICD-10-CM | POA: Diagnosis not present

## 2023-09-05 DIAGNOSIS — E039 Hypothyroidism, unspecified: Secondary | ICD-10-CM | POA: Diagnosis not present

## 2023-09-05 DIAGNOSIS — Z794 Long term (current) use of insulin: Secondary | ICD-10-CM | POA: Diagnosis not present

## 2023-09-05 DIAGNOSIS — E1121 Type 2 diabetes mellitus with diabetic nephropathy: Secondary | ICD-10-CM | POA: Diagnosis not present

## 2023-09-05 DIAGNOSIS — E1169 Type 2 diabetes mellitus with other specified complication: Secondary | ICD-10-CM | POA: Diagnosis not present

## 2023-09-05 DIAGNOSIS — E1159 Type 2 diabetes mellitus with other circulatory complications: Secondary | ICD-10-CM | POA: Diagnosis not present

## 2023-09-05 DIAGNOSIS — E785 Hyperlipidemia, unspecified: Secondary | ICD-10-CM | POA: Diagnosis not present

## 2023-09-05 DIAGNOSIS — I152 Hypertension secondary to endocrine disorders: Secondary | ICD-10-CM | POA: Diagnosis not present

## 2023-09-18 DIAGNOSIS — I5020 Unspecified systolic (congestive) heart failure: Secondary | ICD-10-CM | POA: Diagnosis not present

## 2023-09-18 DIAGNOSIS — E669 Obesity, unspecified: Secondary | ICD-10-CM | POA: Diagnosis not present

## 2023-09-18 DIAGNOSIS — G4733 Obstructive sleep apnea (adult) (pediatric): Secondary | ICD-10-CM | POA: Diagnosis not present

## 2023-09-26 DIAGNOSIS — M51369 Other intervertebral disc degeneration, lumbar region without mention of lumbar back pain or lower extremity pain: Secondary | ICD-10-CM | POA: Diagnosis not present

## 2023-09-26 DIAGNOSIS — M791 Myalgia, unspecified site: Secondary | ICD-10-CM | POA: Diagnosis not present

## 2023-09-26 DIAGNOSIS — G8929 Other chronic pain: Secondary | ICD-10-CM | POA: Diagnosis not present

## 2023-09-26 DIAGNOSIS — M25551 Pain in right hip: Secondary | ICD-10-CM | POA: Diagnosis not present

## 2023-09-26 DIAGNOSIS — M5416 Radiculopathy, lumbar region: Secondary | ICD-10-CM | POA: Diagnosis not present

## 2023-09-26 DIAGNOSIS — Z79899 Other long term (current) drug therapy: Secondary | ICD-10-CM | POA: Diagnosis not present

## 2023-10-03 DIAGNOSIS — H04123 Dry eye syndrome of bilateral lacrimal glands: Secondary | ICD-10-CM | POA: Diagnosis not present

## 2023-10-03 DIAGNOSIS — E113293 Type 2 diabetes mellitus with mild nonproliferative diabetic retinopathy without macular edema, bilateral: Secondary | ICD-10-CM | POA: Diagnosis not present

## 2023-10-09 DIAGNOSIS — M5416 Radiculopathy, lumbar region: Secondary | ICD-10-CM | POA: Diagnosis not present

## 2023-10-11 DIAGNOSIS — S91102A Unspecified open wound of left great toe without damage to nail, initial encounter: Secondary | ICD-10-CM | POA: Diagnosis not present

## 2023-10-11 DIAGNOSIS — G4733 Obstructive sleep apnea (adult) (pediatric): Secondary | ICD-10-CM | POA: Diagnosis not present

## 2023-10-23 DIAGNOSIS — Z008 Encounter for other general examination: Secondary | ICD-10-CM | POA: Diagnosis not present

## 2023-10-23 DIAGNOSIS — I2510 Atherosclerotic heart disease of native coronary artery without angina pectoris: Secondary | ICD-10-CM | POA: Diagnosis not present

## 2023-10-23 DIAGNOSIS — G43919 Migraine, unspecified, intractable, without status migrainosus: Secondary | ICD-10-CM | POA: Diagnosis not present

## 2023-10-23 DIAGNOSIS — Z6835 Body mass index (BMI) 35.0-35.9, adult: Secondary | ICD-10-CM | POA: Diagnosis not present

## 2023-10-23 DIAGNOSIS — J449 Chronic obstructive pulmonary disease, unspecified: Secondary | ICD-10-CM | POA: Diagnosis not present

## 2023-10-23 DIAGNOSIS — F411 Generalized anxiety disorder: Secondary | ICD-10-CM | POA: Diagnosis not present

## 2023-10-23 DIAGNOSIS — F319 Bipolar disorder, unspecified: Secondary | ICD-10-CM | POA: Diagnosis not present

## 2023-10-23 DIAGNOSIS — E279 Disorder of adrenal gland, unspecified: Secondary | ICD-10-CM | POA: Diagnosis not present

## 2023-10-23 DIAGNOSIS — F17211 Nicotine dependence, cigarettes, in remission: Secondary | ICD-10-CM | POA: Diagnosis not present

## 2023-11-01 ENCOUNTER — Ambulatory Visit
Admission: EM | Admit: 2023-11-01 | Discharge: 2023-11-01 | Disposition: A | Discharge status: Home / Self Care | Attending: Emergency Medicine | Admitting: Emergency Medicine

## 2023-11-01 ENCOUNTER — Encounter: Payer: Self-pay | Admitting: Emergency Medicine

## 2023-11-01 DIAGNOSIS — S91102D Unspecified open wound of left great toe without damage to nail, subsequent encounter: Secondary | ICD-10-CM

## 2023-11-01 DIAGNOSIS — Z7985 Long-term (current) use of injectable non-insulin antidiabetic drugs: Secondary | ICD-10-CM

## 2023-11-01 DIAGNOSIS — E11628 Type 2 diabetes mellitus with other skin complications: Secondary | ICD-10-CM

## 2023-11-01 MED ORDER — MUPIROCIN 2 % EX OINT: 1.0000 | TOPICAL_OINTMENT | Freq: Two times a day (BID) | CUTANEOUS | 0 refills | Status: AC

## 2023-11-01 MED ORDER — DOXYCYCLINE HYCLATE 100 MG PO CAPS: 100.0000 mg | ORAL_CAPSULE | Freq: Two times a day (BID) | ORAL | 0 refills | Status: AC

## 2023-11-01 NOTE — ED Triage Notes (Addendum)
 Patient in office today complaint of wound to left great toe x 3wks was seen at [facility] for this issue in August. Complain of still not healing well. Patient in diabetic.  OTC: peroxide  Denies: pain, swelling

## 2023-11-01 NOTE — Discharge Instructions (Addendum)
 Follow up with your primary care provider or a podiatrist.    Use the mupirocin  ointment and a nonstick bandage as directed.  Take the doxycycline  as directed.

## 2023-11-01 NOTE — ED Provider Notes (Signed)
 CAY RALPH PELT    CSN: [id number] Arrival date & time: 11/01/23  9161      History   Chief Complaint Chief Complaint  Patient presents with   Wound Check    HPI Marie Clarke is a 53 y.o. female.  Patient presents with 1 month history of open sore on her left great toe.  She states the sore is continuing to bleed and is not healing well.  No fever or purulent drainage.  Patient was seen at [facility] clinic on 10/11/2023; diagnosed with open wound of left great toe; treated with doxycycline  x 7 days.  Patient is diabetic; last A1c 6.1 on 08/29/2023  The history is provided by the patient and medical records.    Past Medical History:  Diagnosis Date   Anemia    Anginal pain (HCC)    Anxiety    Arthritis    Asthma    Bilateral lower extremity edema    Bipolar disorder (HCC)    CAD (coronary artery disease) unk   CHF (congestive heart failure) (HCC)    Chronic pain syndrome    COPD (chronic obstructive pulmonary disease) (HCC)    emphysema   DDD (degenerative disc disease), lumbar    Depression unk   Diabetes mellitus without complication (HCC)    Diabetes mellitus, type II (HCC)    Drug overdose 2015   after dad died   Dysrhythmia    history of tachy arrythmias   Family history of adverse reaction to anesthesia    sister had shortness of breath after surgery   Fibromyalgia    GERD (gastroesophageal reflux disease)    Headache    chronic migraines   Hyperlipidemia    Hypertension    Hypothyroidism    Left leg pain 04/29/2014   MI (myocardial infarction) (HCC) 2007   Muscle ache 09/16/2014   Osteoporosis    Overactive bladder    Pancreatitis unk   Panic attacks    PTSD (post-traumatic stress disorder)    Reflex sympathetic dystrophy    Renal cyst, left    Renal insufficiency    ckd stage iii   Restless legs syndrome    Sleep apnea 06/2019   uses cpap   Stage 3a chronic kidney disease (HCC)    Stroke (HCC) 2010   TIA x 2. no residual deficits   Suicide  attempt [facility])    after father died.   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   Seizure-like activity (HCC) 09/30/2020   Hyperlipidemia    COPD (chronic obstructive pulmonary disease) (HCC)    Chronic diastolic CHF (congestive heart failure) (HCC)    Pain in thoracic spine 05/02/2020   Rectal pain 04/07/2020   Partial small bowel obstruction (HCC) 03/29/2020   Adrenal nodule (HCC) 03/19/2020   Hypokalemia    SBO (small bowel obstruction) (HCC) 12/02/2019   Chronic pain 12/02/2019   Presence of neurostimulator (SCS) (June 2021) 10/06/2019   Pain due to any device, implant or graft 09/21/2019   Anxiety 09/10/2019   Involuntary movements 09/10/2019   Abnormal involuntary movement 08/18/2019   Bipolar disorder, in full  remission, most recent episode mixed (HCC) 08/11/2019   Lumbosacral radiculopathy at L5 (Right) 04/09/2019   Chronic migraine 03/12/2019   Gout 01/21/2019   Noncompliance with treatment regimen 12/29/2018   BMI 40.0-44.9, adult (HCC) 11/19/2018   AKI (acute kidney injury) (HCC) 11/17/2018   Postural dizziness with presyncope 09/29/2018   Bipolar I disorder, most recent episode mixed (HCC) 09/10/2018   Panic attacks 09/10/2018   Chronic lumbosacral L5-S1 IVD protrusion (Bilateral) 08/25/2018   Lumbar lateral recess stenosis (L5-S1) (Bilateral) 08/25/2018   Wound disruption 08/05/2018   Osteoarthritis involving multiple joints 07/10/2018   Long term current use of non-steroidal anti-inflammatories (NSAID) 07/10/2018   NSAID induced gastritis 07/10/2018   DDD (degenerative disc disease), lumbosacral 04/22/2018   Disease related peripheral  neuropathy 11/13/2017   Gastroesophageal reflux disease without esophagitis 11/13/2017   Occipital headache (Bilateral) 11/13/2017   Cervicogenic headache (Bilateral) (L>R) 11/13/2017   History of postoperative nausea 10/22/2017   Chronic shoulder pain (5th area of Pain) (Bilateral) (L>R) 10/09/2017   Ankle joint instability (Left) 10/09/2017   Ankle sprain, sequela (Left) 10/09/2017   History of psychiatric symptoms 10/09/2017   Chronic pain syndrome 10/02/2017   Spondylosis without myelopathy or radiculopathy, lumbosacral region 10/02/2017   Chronic musculoskeletal pain 10/02/2017   Elevated C-reactive protein (CRP) 09/10/2017   Elevated sed rate 09/10/2017   Chronic neck pain (4th area of Pain) (Bilateral) (L>R) 09/09/2017   Pharmacologic therapy 09/09/2017   Disorder of skeletal system 09/09/2017   Problems influencing health status 09/09/2017   Long term current use of opiate analgesic 09/09/2017   Tobacco use disorder 07/16/2017   Ventral hernia without obstruction or gangrene 06/25/2017   Strain of extensor muscle, fascia and tendon of left index finger at wrist and hand level, initial encounter 05/16/2017   Sepsis (HCC) 04/14/2017   Osteopenia 04/03/2017   Hypotension 09/17/2016   Contusion of knee (Left) 10/12/2015   Strain of knee (Left) 10/12/2015   Incidental lung nodule 04/28/2015   Chronic low back pain (1ry area of Pain) (Bilateral) (L>R) 03/28/2015   Chronic lower extremity pain (Referred) (2ry area of Pain) (Left) 03/28/2015   Abdominal wound dehiscence 03/28/2015   Encounter for pain management planning 03/28/2015   Morbid obesity (HCC) 03/28/2015   Abnormal CT scan, lumbar spine 03/28/2015   Lumbar facet hypertrophy 03/28/2015   Lumbar facet syndrome (Bilateral) (L>R) 03/28/2015   Lumbar foraminal stenosis (Bilateral) (L5-S1) 03/28/2015   Chronic ankle pain (3ry area of Pain) (Left) 03/28/2015   Neurogenic pain 03/28/2015   Neuropathic pain 03/28/2015    Myofascial pain 03/28/2015   History of suicide attempt 03/28/2015   PTSD (post-traumatic stress disorder) 01/13/2015   Abnormal gait 12/15/2014   Congestive heart failure (HCC) 11/15/2014   Abdominal wall abscess 09/20/2014   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   Diabetes mellitus, type 2 (HCC) 08/13/2014   Bipolar affective disorder (HCC) 08/13/2014   Type 2 diabetes mellitus with hyperlipidemia (HCC) 08/13/2014   Rectal prolapse 08/09/2014   Rectal bleeding 08/09/2014   Rectal bleed 08/09/2014   Affective bipolar disorder (HCC) 08/05/2014   Arteriosclerosis of coronary artery 08/05/2014   CCF (congestive cardiac failure) (HCC) 08/05/2014   Chronic kidney disease 08/05/2014   Detrusor muscle hypertonia 08/05/2014   Apnea, sleep 08/05/2014   Temporary cerebral vascular dysfunction 08/05/2014   Polypharmacy 04/29/2014   Other long term (current) drug therapy 04/29/2014   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   Chronic kidney disease, stage III (moderate) (HCC) 12/14/2013   Controlled diabetes mellitus type II without  complication (HCC) 12/03/2013   Essential (primary) hypertension 12/03/2013   Adult hypothyroidism 12/03/2013   Controlled type 2 diabetes mellitus without complication (HCC) 12/03/2013    Past Surgical History:  Procedure Laterality Date   ABDOMINAL HYSTERECTOMY     BIOPSY  09/19/2022   Procedure: BIOPSY;  Surgeon: Toledo, Ladell POUR, MD;  Location: [facility];  Service: Gastroenterology;;   CARDIAC CATHETERIZATION  2015   COLONOSCOPY     ELBOW FRACTURE SURGERY Left    pin placed   ESOPHAGOGASTRODUODENOSCOPY     ESOPHAGOGASTRODUODENOSCOPY (EGD) WITH PROPOFOL  N/A 09/19/2022   Procedure: ESOPHAGOGASTRODUODENOSCOPY (EGD) WITH PROPOFOL ;  Surgeon: Toledo, Ladell POUR, MD;  Location: [facility] ENDOS[facility];  Service: Gastroenterology;  Laterality: N/A;   HERNIA REPAIR  07/15/2017   [facility]   HERNIA REPAIR  04/10/2021   [facility]   hyoid surgery & excision lingual tonsil  12/2020   lumbar  facet     several   MELANOMA EXCISION  03/2020   OUTSIDE OF RECTUM   prolapse rectum surgery N/A 08/2014   SPINAL CORD STIMULATOR REMOVAL N/A 11/07/2020   Procedure: REMOVAL OF THORACIC SPINAL CORD STIMULATOR & PULSE GENERATOR;  Surgeon: Bluford Standing, MD;  Location: [facility] ORS;  Service: Neurosurgery;  Laterality: N/A;   THORACIC LAMINECTOMY FOR SPINAL CORD STIMULATOR N/A 07/27/2019   Procedure: THORACIC SPINAL CORD STIMULATOR PLACEMENT WITH RIGHT FLANK PULSE GENERATOR;  Surgeon: Bluford Standing, MD;  Location: [facility] ORS;  Service: Neurosurgery;  Laterality: N/A;   TONSILLECTOMY     TOTAL ABDOMINAL HYSTERECTOMY W/ BILATERAL SALPINGOOPHORECTOMY  2005   TOTAL HIP ARTHROPLASTY Left 08/07/2021   Procedure: TOTAL HIP ARTHROPLASTY ANTERIOR APPROACH;  Surgeon: Leora Lynwood SAUNDERS, MD;  Location: [facility] ORS;  Service: Orthopedics;  Laterality: Left;    OB History     Gravida  0   Para  0   Term  0   Preterm  0   AB  0   Living  0      SAB  0   IAB  0   Ectopic  0   Multiple  0   Live Births  0        Obstetric Comments  Menstrual age: 7  Age 1st Pregnancy:           Home Medications    Prior to Admission medications   Medication Sig Start Date End Date Taking? Authorizing Provider  doxycycline  (VIBRAMYCIN ) 100 MG capsule Take 1 capsule (100 mg total) by mouth 2 (two) times daily for 7 days. 11/01/23 11/08/23 Yes Corlis Burnard DEL, NP  mupirocin  ointment (BACTROBAN ) 2 % Apply 1 Application topically 2 (two) times daily. 11/01/23  Yes Corlis Burnard DEL, NP  albuterol  (PROVENTIL ) (2.5 MG/3ML) 0.083% nebulizer solution Take 2.5 mg by nebulization every 6 (six) hours as needed for wheezing or shortness of breath.    [provider]  aspirin  81 MG chewable tablet Chew 1 tablet (81 mg total) by mouth 2 (two) times daily. 08/09/21   Joshua Lin, PA-C  atorvastatin  (LIPITOR) 40 MG tablet Take 40 mg by mouth at bedtime. 04/05/20   [provider]  cariprazine  (VRAYLAR ) 3 MG  capsule Take 3 mg by mouth at bedtime.    [provider]  cholecalciferol  (VITAMIN D3) 25 MCG (1000 UNIT) tablet Take 1,000 Units by mouth daily.    [provider]  clonazepam  (KLONOPIN ) 0.125 MG disintegrating tablet Take 0.125 mg by mouth 3 (three) times daily. 08/25/20   [provider]  cyanocobalamin  1000 MCG tablet  Take 1,000 mcg by mouth daily.    [provider]  diclofenac  (VOLTAREN ) 75 MG EC tablet Take 75 mg by mouth 2 (two) times daily.    [provider]  dicyclomine  (BENTYL ) 10 MG capsule Take 10 mg by mouth 3 (three) times daily as needed for spasms.    [provider]  docusate sodium  (COLACE) 100 MG capsule Take 1 capsule (100 mg total) by mouth 2 (two) times daily. 08/09/21   Joshua Lin, PA-C  empagliflozin  (JARDIANCE ) 10 MG TABS tablet Take 10 mg by mouth daily.    [provider]  EPINEPHrine  0.3 mg/0.3 mL IJ SOAJ injection Inject 0.3 mg into the muscle as needed for anaphylaxis. 07/22/23   Clarine Ozell LABOR, MD  esomeprazole (NEXIUM) 40 MG capsule Take 40 mg by mouth at bedtime.    [provider]  febuxostat  (ULORIC ) 40 MG tablet Take 40 mg by mouth daily.    [provider]  furosemide  (LASIX ) 80 MG tablet Take 80 mg by mouth 2 (two) times daily. 09/08/20   [provider]  gabapentin  (NEURONTIN ) 600 MG tablet Take 800 mg by mouth 3 (three) times daily.    [provider]  HYDROcodone -acetaminophen  (NORCO) 7.5-325 MG tablet Take 1-2 tablets by mouth every 4 (four) hours as needed for severe pain or moderate pain (pain score 7-10). 08/09/21   Joshua Lin, PA-C  levocetirizine (XYZAL ) 5 MG tablet Take 5 mg by mouth every evening. 10/18/20   [provider]  levothyroxine  (SYNTHROID ) 200 MCG tablet Take 200 mcg by mouth daily before breakfast. 1 tablet everyday except for Sunday; only take 1/2 tablet (100 mcg) on Sunday.    [provider]  meclizine  (ANTIVERT ) 25 MG  tablet Take 1 tablet (25 mg total) by mouth 3 (three) times daily as needed for dizziness. 10/27/21   Goodman, Graydon, MD  metoprolol  succinate (TOPROL -XL) 50 MG 24 hr tablet Take 75 mg by mouth 2 (two) times daily.    [provider]  montelukast  (SINGULAIR ) 10 MG tablet Take 10 mg by mouth at bedtime.    [provider]  Multiple Vitamin (MULTIVITAMIN) tablet Take 1 tablet by mouth daily.    [provider]  Multiple Vitamins-Iron (MULTIPLE VITAMIN/IRON PO) Take 1 tablet by mouth daily.    [provider]  Omega-3 Fatty Acids (FISH OIL) 1000 MG CAPS Take 1 capsule by mouth daily.    [provider]  ondansetron  (ZOFRAN -ODT) 4 MG disintegrating tablet Take 4 mg by mouth every 8 (eight) hours as needed for nausea or vomiting.    [provider]  oxycodone -acetaminophen  (LYNOX) 5-300 MG tablet Take 1 tablet by mouth in the morning and at bedtime.    [provider]  potassium chloride  SA (KLOR-CON ) 20 MEQ tablet Take 40 mEq by mouth 2 (two) times daily. 12/25/19   [provider]  PROAIR  HFA 108 (90 Base) MCG/ACT inhaler Inhale 2 puffs into the lungs every 6 (six) hours as needed for wheezing or shortness of breath.    [provider]  rizatriptan  (MAXALT ) 10 MG tablet Take 10 mg by mouth as needed for migraine. 10/19/20   [provider]  rOPINIRole  (REQUIP ) 1 MG tablet Take 1 mg by mouth 3 (three) times daily. 12/10/19   [provider]  Semaglutide,0.25 or 0.5MG /DOS, (OZEMPIC, 0.25 OR 0.5 MG/DOSE,) 2 MG/1.5ML SOPN Inject into the skin once a week.    [provider]  solifenacin (VESICARE) 5 MG tablet Take  5 mg by mouth daily.    [provider]  stavudine (ZERIT) 20 MG capsule Take 20 mg by mouth 2 (two) times daily.    [provider]  SYMBICORT 160-4.5 MCG/ACT inhaler Inhale 2 puffs into the lungs 2 (two) times daily.    [provider]  temazepam  (RESTORIL ) 7.5 MG  capsule Take 7.5 mg by mouth at bedtime as needed for sleep.    [provider]  topiramate  (TOPAMAX ) 50 MG tablet Take 50 mg by mouth 2 (two) times daily.    [provider]  vitamin C (ASCORBIC ACID ) 500 MG tablet Take 500 mg by mouth daily.    [provider]  vitamin E  180 MG (400 UNITS) capsule Take 400 Units by mouth daily.    [provider]    Family History Family History  Problem Relation Age of Onset   Diabetes Mellitus II Mother    CAD Mother    Sleep apnea Mother    Osteoarthritis Mother    Osteoporosis Mother    Anxiety disorder Mother    Depression Mother    Bipolar disorder Mother    Bipolar disorder Father    Hypertension Father    Depression Father    Anxiety disorder Father    Post-traumatic stress disorder Sister     Social History Social History   Tobacco Use   Smoking status: Former    Current packs/day: 0.00    Types: Cigarettes    Quit date: 09/01/2018    Years since quitting: 5.1   Smokeless tobacco: Never   Tobacco comments:    Patient quit smoking 09/01/2018  Vaping Use   Vaping status: Never Used  Substance Use Topics   Alcohol use: No    Alcohol/week: 0.0 standard drinks of alcohol   Drug use: No     Allergies   Diazepam, Ziprasidone hcl, Azithromycin , Divalproex  sodium, Levofloxacin, Lisinopril, Metronidazole, Sulfa antibiotics, Valproic acid , Cephalexin , Ciprofloxacin, Doxycycline , and Penicillins   Review of Systems Review of Systems  Constitutional:  Negative for chills and fever.  Musculoskeletal:  Negative for arthralgias and joint swelling.  Skin:  Positive for color change and wound.     Physical Exam Triage Vital Signs ED Triage Vitals  Encounter Vitals Group     BP 11/01/23 0853 104/73     Girls Systolic BP Percentile --      Girls Diastolic BP Percentile --      Boys Systolic BP Percentile --      Boys Diastolic BP Percentile --      Pulse Rate 11/01/23 0853 80     Resp  11/01/23 0853 18     Temp 11/01/23 0853 98.3 F (36.8 C)     Temp Source 11/01/23 0853 Oral     SpO2 11/01/23 0853 95 %     Weight 11/01/23 0851 205 lb (93 kg)     Height 11/01/23 0851 5' 4 (1.626 m)     Head Circumference --      Peak Flow --      Pain Score 11/01/23 0851 0     Pain Loc --      Pain Education --      Exclude from Growth Chart --    No data found.  Updated Vital Signs BP 104/73   Pulse 80   Temp 98.3 F (36.8 C) (Oral)   Resp 18   Ht 5' 4 (1.626 m)   Wt 205 lb (93 kg)   LMP  (  LMP Unknown)   SpO2 95%   BMI 35.19 kg/m   Visual Acuity Right Eye Distance:   Left Eye Distance:   Bilateral Distance:    Right Eye Near:   Left Eye Near:    Bilateral Near:     Physical Exam Constitutional:      General: She is not in acute distress. HENT:     Mouth/Throat:     Mouth: Mucous membranes are moist.  Cardiovascular:     Rate and Rhythm: Normal rate and regular rhythm.  Pulmonary:     Effort: Pulmonary effort is normal. No respiratory distress.  Musculoskeletal:        General: No deformity. Normal range of motion.  Skin:    General: Skin is warm and dry.     Findings: Lesion present.  Neurological:     Mental Status: She is alert.     Motor: No weakness.     Gait: Gait normal.      UC Treatments / Results  Labs (all labs ordered are listed, but only abnormal results are displayed) Labs Reviewed - No data to display  EKG   Radiology No results found.  Procedures Procedures (including critical care time)  Medications Ordered in UC Medications - No data to display  Initial Impression / Assessment and Plan / UC Course  I have reviewed the triage vital signs and the nursing notes.  Pertinent labs & imaging results that were available during my care of the patient were reviewed by me and considered in my medical decision making (see chart for details).    Open wound of left great toe, type 2 diabetes with skin complication.  Most  recent A1c 6.1.  Patient has a nonhealing wound on her left great toe x 1 month.  She recently completed a 7-day course of doxycycline  without improvement.  She states she has an appointment at [facility] 12/02/2023.  Treating today with another 7-day course of doxycycline  (patient is allergic to multiple antibiotic classes but states she is able to tolerate doxycycline ) and mupirocin  ointment.  Wound care instructions given.  Instructed patient to follow-up with her PCP or a podiatrist.  Contact information given for on-call podiatrist at [facility].  She agrees to plan of care.  Final Clinical Impressions(s) / UC Diagnoses   Final diagnoses:  Open wound of left great toe, subsequent encounter  Type 2 diabetes mellitus with other skin complication, without long-term current use of insulin  [facility])     Discharge Instructions      Follow up with your primary care provider or a podiatrist.    Use the mupirocin  ointment and a nonstick bandage as directed.  Take the doxycycline  as directed.      ED Prescriptions     Medication Sig Dispense Auth. Provider   mupirocin  ointment (BACTROBAN ) 2 % Apply 1 Application topically 2 (two) times daily. 22 g Corlis Burnard DEL, NP   doxycycline  (VIBRAMYCIN ) 100 MG capsule Take 1 capsule (100 mg total) by mouth 2 (two) times daily for 7 days. 14 capsule Corlis Burnard DEL, NP      PDMP not reviewed this encounter.   Corlis Burnard DEL, NP 11/01/23 [phone]

## 2023-12-02 ENCOUNTER — Ambulatory Visit (INDEPENDENT_AMBULATORY_CARE_PROVIDER_SITE_OTHER): Admitting: Podiatry

## 2023-12-02 DIAGNOSIS — M2011 Hallux valgus (acquired), right foot: Secondary | ICD-10-CM

## 2023-12-02 DIAGNOSIS — M79675 Pain in left toe(s): Secondary | ICD-10-CM | POA: Diagnosis not present

## 2023-12-02 DIAGNOSIS — E1142 Type 2 diabetes mellitus with diabetic polyneuropathy: Secondary | ICD-10-CM | POA: Diagnosis not present

## 2023-12-02 DIAGNOSIS — Z87828 Personal history of other (healed) physical injury and trauma: Secondary | ICD-10-CM

## 2023-12-02 DIAGNOSIS — Z0189 Encounter for other specified special examinations: Secondary | ICD-10-CM | POA: Diagnosis not present

## 2023-12-02 DIAGNOSIS — B351 Tinea unguium: Secondary | ICD-10-CM

## 2023-12-02 DIAGNOSIS — M79674 Pain in right toe(s): Secondary | ICD-10-CM | POA: Diagnosis not present

## 2023-12-02 DIAGNOSIS — M2012 Hallux valgus (acquired), left foot: Secondary | ICD-10-CM

## 2023-12-02 DIAGNOSIS — E119 Type 2 diabetes mellitus without complications: Secondary | ICD-10-CM

## 2023-12-02 NOTE — Progress Notes (Unsigned)
 Subjective: Marie Clarke presents today {jgcomplaint:23593}.  Patient relates {Numbers; 0-100:15068} year h/o diabetes.  Patient denies any h/o foot wounds.  Patient has h/o foot ulcer of {jgPodToeLocator:23637}, which healed via help of ***.  Patient endorses symptoms of foot numbness.   Patient endorses symptoms of foot tingling.  Patient endorses symptoms of burning in feet.  Patient endorses symptoms of pins/needles sensation in feet.  Patient denies any numbness, tingling, burning, or pins/needle sensation in feet.  Patient has been diagnosed with neuropathy.  Risk factors: {jgriskfactors:24044}.  PCP is Sadie Manna, MD , and last visit was {Time; dates multiple:304500300}.  Past Medical History:  Diagnosis Date   Anemia    Anginal pain    Anxiety    Arthritis    Asthma    Bilateral lower extremity edema    Bipolar disorder (HCC)    CAD (coronary artery disease) unk   CHF (congestive heart failure) (HCC)    Chronic pain syndrome    COPD (chronic obstructive pulmonary disease) (HCC)    emphysema   DDD (degenerative disc disease), lumbar    Depression unk   Diabetes mellitus without complication (HCC)    Diabetes mellitus, type II (HCC)    Drug overdose 2015   after dad died   Dysrhythmia    history of tachy arrythmias   Family history of adverse reaction to anesthesia    sister had shortness of breath after surgery   Fibromyalgia    GERD (gastroesophageal reflux disease)    Headache    chronic migraines   Hyperlipidemia    Hypertension    Hypothyroidism    Left leg pain 04/29/2014   MI (myocardial infarction) (HCC) 2007   Muscle ache 09/16/2014   Osteoporosis    Overactive bladder    Pancreatitis unk   Panic attacks    PTSD (post-traumatic stress disorder)    Reflex sympathetic dystrophy    Renal cyst, left    Renal insufficiency    ckd stage iii   Restless legs syndrome    Sleep apnea 06/2019   uses cpap   Stage 3a chronic kidney  disease (HCC)    Stroke (HCC) 2010   TIA x 2. no residual deficits   Suicide attempt [facility])    after father died.   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   Seizure-like activity (HCC) 09/30/2020   Hyperlipidemia    COPD (chronic obstructive pulmonary disease) (HCC)    Chronic diastolic CHF (congestive heart failure) (HCC)    Pain in thoracic spine 05/02/2020   Rectal pain 04/07/2020   Partial small bowel obstruction (HCC) 03/29/2020   Adrenal nodule 03/19/2020   Hypokalemia    SBO (small bowel obstruction) (HCC) 12/02/2019   Chronic pain 12/02/2019   Presence of neurostimulator (SCS) (June 2021) 10/06/2019   Pain due to any device, implant or graft 09/21/2019   Anxiety 09/10/2019   Involuntary movements 09/10/2019   Abnormal involuntary movement 08/18/2019   Bipolar disorder, in full remission, most recent episode mixed 08/11/2019   Lumbosacral radiculopathy at L5 (Right) 04/09/2019   Chronic migraine 03/12/2019   Gout 01/21/2019  Noncompliance with treatment regimen 12/29/2018   BMI 40.0-44.9, adult (HCC) 11/19/2018   AKI (acute kidney injury) 11/17/2018   Postural dizziness with presyncope 09/29/2018   Bipolar I disorder, most recent episode mixed (HCC) 09/10/2018   Panic attacks 09/10/2018   Chronic lumbosacral L5-S1 IVD protrusion (Bilateral) 08/25/2018   Lumbar lateral recess stenosis (L5-S1) (Bilateral) 08/25/2018   Wound disruption 08/05/2018   Osteoarthritis involving multiple joints 07/10/2018   Long term current use of non-steroidal anti-inflammatories (NSAID) 07/10/2018   NSAID induced gastritis 07/10/2018   DDD (degenerative disc  disease), lumbosacral 04/22/2018   Disease related peripheral neuropathy 11/13/2017   Gastroesophageal reflux disease without esophagitis 11/13/2017   Occipital headache (Bilateral) 11/13/2017   Cervicogenic headache (Bilateral) (L>R) 11/13/2017   History of postoperative nausea 10/22/2017   Chronic shoulder pain (5th area of Pain) (Bilateral) (L>R) 10/09/2017   Ankle joint instability (Left) 10/09/2017   Ankle sprain, sequela (Left) 10/09/2017   History of psychiatric symptoms 10/09/2017   Chronic pain syndrome 10/02/2017   Spondylosis without myelopathy or radiculopathy, lumbosacral region 10/02/2017   Chronic musculoskeletal pain 10/02/2017   Elevated C-reactive protein (CRP) 09/10/2017   Elevated sed rate 09/10/2017   Chronic neck pain (4th area of Pain) (Bilateral) (L>R) 09/09/2017   Pharmacologic therapy 09/09/2017   Disorder of skeletal system 09/09/2017   Problems influencing health status 09/09/2017   Long term current use of opiate analgesic 09/09/2017   Tobacco use disorder 07/16/2017   Ventral hernia without obstruction or gangrene 06/25/2017   Strain of extensor muscle, fascia and tendon of left index finger at wrist and hand level, initial encounter 05/16/2017   Sepsis (HCC) 04/14/2017   Osteopenia 04/03/2017   Hypotension 09/17/2016   Contusion of knee (Left) 10/12/2015   Strain of knee (Left) 10/12/2015   Incidental lung nodule 04/28/2015   Chronic low back pain (1ry area of Pain) (Bilateral) (L>R) 03/28/2015   Chronic lower extremity pain (Referred) (2ry area of Pain) (Left) 03/28/2015   Abdominal wound dehiscence 03/28/2015   Encounter for pain management planning 03/28/2015   Morbid obesity (HCC) 03/28/2015   Abnormal CT scan, lumbar spine 03/28/2015   Lumbar facet hypertrophy 03/28/2015   Lumbar facet syndrome (Bilateral) (L>R) 03/28/2015   Lumbar foraminal stenosis (Bilateral) (L5-S1) 03/28/2015   Chronic ankle pain (3ry area of Pain) (Left) 03/28/2015    Neurogenic pain 03/28/2015   Neuropathic pain 03/28/2015   Myofascial pain 03/28/2015   History of suicide attempt 03/28/2015   PTSD (post-traumatic stress disorder) 01/13/2015   Abnormal gait 12/15/2014   Congestive heart failure (HCC) 11/15/2014   Abdominal wall abscess 09/20/2014   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   Diabetes mellitus, type 2 (HCC) 08/13/2014   Bipolar affective disorder (HCC) 08/13/2014   Type 2 diabetes mellitus with hyperlipidemia (HCC) 08/13/2014   Rectal prolapse 08/09/2014   Rectal bleeding 08/09/2014   Rectal bleed 08/09/2014   Affective bipolar disorder (HCC) 08/05/2014   Arteriosclerosis of coronary artery 08/05/2014   CCF (congestive cardiac failure) (HCC) 08/05/2014   Chronic kidney disease 08/05/2014   Detrusor muscle hypertonia 08/05/2014   Apnea, sleep 08/05/2014   Temporary cerebral vascular dysfunction 08/05/2014   Polypharmacy 04/29/2014   Other long term (current) drug therapy 04/29/2014   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   Chronic kidney disease, stage III (moderate) (HCC) 12/14/2013   Controlled diabetes mellitus type II without complication (HCC) 12/03/2013   Essential (primary) hypertension 12/03/2013   Adult hypothyroidism 12/03/2013   Controlled type 2 diabetes mellitus without complication (HCC) 12/03/2013  Past Surgical History:  Procedure Laterality Date   ABDOMINAL HYSTERECTOMY     BIOPSY  09/19/2022   Procedure: BIOPSY;  Surgeon: Toledo, Ladell POUR, MD;  Location: [facility];  Service: Gastroenterology;;   CARDIAC CATHETERIZATION  2015   COLONOSCOPY     ELBOW FRACTURE SURGERY Left    pin placed   ESOPHAGOGASTRODUODENOSCOPY     ESOPHAGOGASTRODUODENOSCOPY (EGD) WITH PROPOFOL  N/A 09/19/2022   Procedure: ESOPHAGOGASTRODUODENOSCOPY (EGD) WITH PROPOFOL ;  Surgeon: Toledo, Ladell POUR, MD;  Location: [facility];  Service: Gastroenterology;  Laterality: N/A;   HERNIA REPAIR  07/15/2017   [facility]   HERNIA REPAIR  04/10/2021   [facility]    hyoid surgery & excision lingual tonsil  12/2020   lumbar facet     several   MELANOMA EXCISION  03/2020   OUTSIDE OF RECTUM   prolapse rectum surgery N/A 08/2014   SPINAL CORD STIMULATOR REMOVAL N/A 11/07/2020   Procedure: REMOVAL OF THORACIC SPINAL CORD STIMULATOR & PULSE GENERATOR;  Surgeon: Bluford Standing, MD;  Location: [facility];  Service: Neurosurgery;  Laterality: N/A;   THORACIC LAMINECTOMY FOR SPINAL CORD STIMULATOR N/A 07/27/2019   Procedure: THORACIC SPINAL CORD STIMULATOR PLACEMENT WITH RIGHT FLANK PULSE GENERATOR;  Surgeon: Bluford Standing, MD;  Location: [facility];  Service: Neurosurgery;  Laterality: N/A;   TONSILLECTOMY     TOTAL ABDOMINAL HYSTERECTOMY W/ BILATERAL SALPINGOOPHORECTOMY  2005   TOTAL HIP ARTHROPLASTY Left 08/07/2021   Procedure: TOTAL HIP ARTHROPLASTY ANTERIOR APPROACH;  Surgeon: Leora Lynwood SAUNDERS, MD;  Location: [facility];  Service: Orthopedics;  Laterality: Left;    Current Outpatient Medications on File Prior to Visit  Medication Sig Dispense Refill   albuterol  (PROVENTIL ) (2.5 MG/3ML) 0.083% nebulizer solution Take 2.5 mg by nebulization every 6 (six) hours as needed for wheezing or shortness of breath.     aspirin  81 MG chewable tablet Chew 1 tablet (81 mg total) by mouth 2 (two) times daily. 60 tablet 0   atorvastatin  (LIPITOR) 40 MG tablet Take 40 mg by mouth at bedtime.     cariprazine  (VRAYLAR ) 3 MG capsule Take 3 mg by mouth at bedtime.     cholecalciferol  (VITAMIN D3) 25 MCG (1000 UNIT) tablet Take 1,000 Units by mouth daily.     clonazepam  (KLONOPIN ) 0.125 MG disintegrating tablet Take 0.125 mg by mouth 3 (three) times daily.     cyanocobalamin  1000 MCG tablet Take 1,000 mcg by mouth daily.     diclofenac  (VOLTAREN ) 75 MG EC tablet Take 75 mg by mouth 2 (two) times daily.     dicyclomine  (BENTYL ) 10 MG capsule Take 10 mg by mouth 3 (three) times daily as needed for spasms.     docusate sodium  (COLACE) 100 MG capsule Take 1 capsule (100 mg total) by mouth 2  (two) times daily. 30 capsule 0   empagliflozin  (JARDIANCE ) 10 MG TABS tablet Take 10 mg by mouth daily.     EPINEPHrine  0.3 mg/0.3 mL IJ SOAJ injection Inject 0.3 mg into the muscle as needed for anaphylaxis. 1 each 1   esomeprazole (NEXIUM) 40 MG capsule Take 40 mg by mouth at bedtime.     febuxostat  (ULORIC ) 40 MG tablet Take 40 mg by mouth daily.     furosemide  (LASIX ) 80 MG tablet Take 80 mg by mouth 2 (two) times daily.     gabapentin  (NEURONTIN ) 600 MG tablet Take 800 mg by mouth 3 (three) times daily.     HYDROcodone -acetaminophen  (NORCO) 7.5-325 MG tablet Take 1-2 tablets by mouth every  4 (four) hours as needed for severe pain or moderate pain (pain score 7-10). 60 tablet 0   levocetirizine (XYZAL ) 5 MG tablet Take 5 mg by mouth every evening.     levothyroxine  (SYNTHROID ) 200 MCG tablet Take 200 mcg by mouth daily before breakfast. 1 tablet everyday except for Sunday; only take 1/2 tablet (100 mcg) on Sunday.     meclizine  (ANTIVERT ) 25 MG tablet Take 1 tablet (25 mg total) by mouth 3 (three) times daily as needed for dizziness. 20 tablet 0   metoprolol  succinate (TOPROL -XL) 50 MG 24 hr tablet Take 75 mg by mouth 2 (two) times daily.     montelukast  (SINGULAIR ) 10 MG tablet Take 10 mg by mouth at bedtime.     Multiple Vitamin (MULTIVITAMIN) tablet Take 1 tablet by mouth daily.     Multiple Vitamins-Iron (MULTIPLE VITAMIN/IRON PO) Take 1 tablet by mouth daily.     mupirocin  ointment (BACTROBAN ) 2 % Apply 1 Application topically 2 (two) times daily. 22 g 0   Omega-3 Fatty Acids (FISH OIL) 1000 MG CAPS Take 1 capsule by mouth daily.     ondansetron  (ZOFRAN -ODT) 4 MG disintegrating tablet Take 4 mg by mouth every 8 (eight) hours as needed for nausea or vomiting.     oxycodone -acetaminophen  (LYNOX) 5-300 MG tablet Take 1 tablet by mouth in the morning and at bedtime.     potassium chloride  SA (KLOR-CON ) 20 MEQ tablet Take 40 mEq by mouth 2 (two) times daily.     PROAIR  HFA 108 (90 Base)  MCG/ACT inhaler Inhale 2 puffs into the lungs every 6 (six) hours as needed for wheezing or shortness of breath.     rizatriptan  (MAXALT ) 10 MG tablet Take 10 mg by mouth as needed for migraine.     rOPINIRole  (REQUIP ) 1 MG tablet Take 1 mg by mouth 3 (three) times daily.     Semaglutide,0.25 or 0.5MG /DOS, (OZEMPIC, 0.25 OR 0.5 MG/DOSE,) 2 MG/1.5ML SOPN Inject into the skin once a week.     solifenacin (VESICARE) 5 MG tablet Take 5 mg by mouth daily.     stavudine (ZERIT) 20 MG capsule Take 20 mg by mouth 2 (two) times daily.     SYMBICORT 160-4.5 MCG/ACT inhaler Inhale 2 puffs into the lungs 2 (two) times daily.     temazepam  (RESTORIL ) 7.5 MG capsule Take 7.5 mg by mouth at bedtime as needed for sleep.     topiramate  (TOPAMAX ) 50 MG tablet Take 50 mg by mouth 2 (two) times daily.     vitamin C (ASCORBIC ACID ) 500 MG tablet Take 500 mg by mouth daily.     vitamin E  180 MG (400 UNITS) capsule Take 400 Units by mouth daily.     No current facility-administered medications on file prior to visit.     Allergies  Allergen Reactions   Diazepam Hives and Nausea And Vomiting   Ziprasidone Hcl Itching and Other (See Comments)    Confusion    Azithromycin  Hives   Divalproex  Sodium Hives   Levofloxacin Hives and Rash    1/31: would like to retry since not taking valium   Lisinopril Other (See Comments)    Medicine cause kidney failure   Metronidazole Hives    1/31: would like to retry since she is no longer taking valium   Sulfa Antibiotics Hives   Valproic Acid  Itching and Rash   Cephalexin  Hives   Ciprofloxacin Hives   Doxycycline  Diarrhea   Penicillins Hives    Has  patient had a PCN reaction causing immediate rash, facial/tongue/throat swelling, SOB or lightheadedness with hypotension: No Has patient had a PCN reaction causing severe rash involving mucus membranes or skin necrosis: No Has patient had a PCN reaction that required hospitalization: No Has patient had a PCN reaction  occurring within the last 10 years: No If all of the above answers are NO, then may proceed with Cephalosporin use.     Social History   Occupational History    Comment: not employed  Tobacco Use   Smoking status: Former    Current packs/day: 0.00    Types: Cigarettes    Quit date: 09/01/2018    Years since quitting: 5.2   Smokeless tobacco: Never   Tobacco comments:    Patient quit smoking 09/01/2018  Vaping Use   Vaping status: Never Used  Substance and Sexual Activity   Alcohol use: No    Alcohol/week: 0.0 standard drinks of alcohol   Drug use: No   Sexual activity: Not Currently    Family History  Problem Relation Age of Onset   Diabetes Mellitus II Mother    CAD Mother    Sleep apnea Mother    Osteoarthritis Mother    Osteoporosis Mother    Anxiety disorder Mother    Depression Mother    Bipolar disorder Mother    Bipolar disorder Father    Hypertension Father    Depression Father    Anxiety disorder Father    Post-traumatic stress disorder Sister     Immunization History  Administered Date(s) Administered   PFIZER Comirnaty(Gray Top)Covid-19 Tri-Sucrose Vaccine 09/16/2020   PFIZER(Purple Top)SARS-COV-2 Vaccination 04/15/2019, 05/06/2019, 11/30/2019    Objective: There were no vitals filed for this visit.  Marie Clarke is a pleasant 53 y.o. female {jgbodyhabitus:24098} AAO X 3.  {Perform Simple Foot Exam  Perform Detailed exam:1} {Insert foot Exam (Optional):30965}   Lab Results  Component Value Date   HGBA1C 7.2 (H) 08/07/2021   No results found. Assessment: No diagnosis found.   ADA Risk Categorization: Low Risk:  Patient has all of the following: Intact protective sensation No prior foot ulcer  No severe deformity Pedal pulses present  High Risk:  Patient has one or more of the following: Loss of protective sensation Absent pedal pulses Severe Foot deformity History of foot ulcer  Plan: {jgplan:23602::-Patient/POA to call should  there be question/concern in the interim.}  No follow-ups on file.  Marie Clarke, DPM      [facility] LOCATION: [address] [place] [address]                   Office [phone]   [facility] LOCATION: [address] [place], [place] [address] Office [phone]

## 2023-12-06 ENCOUNTER — Encounter: Payer: Self-pay | Admitting: Podiatry

## 2024-03-09 ENCOUNTER — Ambulatory Visit: Admitting: Podiatry

## 2024-03-18 ENCOUNTER — Ambulatory Visit

## 2024-03-18 DIAGNOSIS — E1142 Type 2 diabetes mellitus with diabetic polyneuropathy: Secondary | ICD-10-CM

## 2024-03-18 DIAGNOSIS — B351 Tinea unguium: Secondary | ICD-10-CM

## 2024-03-18 NOTE — Progress Notes (Signed)
 "  Subjective: Chief Complaint  Patient presents with   Diabetes    DFC. A1c 6.7. She states that her 4th toe on her right foot is turning black.. Swelling is present  Ronal Mose presents today for painful mycotic toenails of both feet that are difficult to trim. Pain interferes with daily activities and wearing enclosed shoe gear comfortably. She is a diabetic.   Patient relates several year h/o diabetes.  Patient has h/o wound left great toe which has healed.  Patient has been diagnosed with neuropathy.  PCP is Sadie Manna, MD.  Past Medical History:  Diagnosis Date   Anemia    Anginal pain    Anxiety    Arthritis    Asthma    Bilateral lower extremity edema    Bipolar disorder (HCC)    CAD (coronary artery disease) unk   CHF (congestive heart failure) (HCC)    Chronic pain syndrome    COPD (chronic obstructive pulmonary disease) (HCC)    emphysema   DDD (degenerative disc disease), lumbar    Depression unk   Diabetes mellitus without complication (HCC)    Diabetes mellitus, type II (HCC)    Drug overdose 2015   after dad died   Dysrhythmia    history of tachy arrythmias   Family history of adverse reaction to anesthesia    sister had shortness of breath after surgery   Fibromyalgia    GERD (gastroesophageal reflux disease)    Headache    chronic migraines   Hyperlipidemia    Hypertension    Hypothyroidism    Left leg pain 04/29/2014   MI (myocardial infarction) (HCC) 2007   Muscle ache 09/16/2014   Osteoporosis    Overactive bladder    Pancreatitis unk   Panic attacks    PTSD (post-traumatic stress disorder)    Reflex sympathetic dystrophy    Renal cyst, left    Renal insufficiency    ckd stage iii   Restless legs syndrome    Sleep apnea 06/2019   uses cpap   Stage 3a chronic kidney disease (HCC)    Stroke (HCC) 2010   TIA x 2. no residual deficits   Suicide attempt [facility])    after father died.   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   Seizure-like activity (HCC) 09/30/2020   Hyperlipidemia    COPD (chronic obstructive pulmonary disease) (HCC)    Chronic diastolic CHF (congestive heart failure) (HCC)    Pain in thoracic spine 05/02/2020   Rectal pain 04/07/2020   Partial small bowel obstruction (HCC) 03/29/2020   Adrenal nodule 03/19/2020   Hypokalemia    SBO (small bowel obstruction) (HCC) 12/02/2019   Chronic pain 12/02/2019   Presence of neurostimulator (SCS) (June 2021) 10/06/2019   Pain due to any device, implant or graft 09/21/2019   Anxiety 09/10/2019   Involuntary movements 09/10/2019   Abnormal involuntary movement 08/18/2019   Bipolar disorder, in full remission, most recent episode mixed 08/11/2019   Lumbosacral radiculopathy at L5 (Right) 04/09/2019   Chronic migraine 03/12/2019   Gout 01/21/2019   Noncompliance with treatment regimen 12/29/2018  BMI 40.0-44.9, adult (HCC) 11/19/2018   AKI (acute kidney injury) 11/17/2018   Postural dizziness with presyncope 09/29/2018   Bipolar I disorder, most recent episode mixed (HCC) 09/10/2018   Panic attacks 09/10/2018   Chronic lumbosacral L5-S1 IVD protrusion (Bilateral) 08/25/2018   Lumbar lateral recess stenosis (L5-S1) (Bilateral) 08/25/2018   Wound disruption 08/05/2018   Osteoarthritis involving multiple joints 07/10/2018   Long term current use of non-steroidal anti-inflammatories (NSAID) 07/10/2018   NSAID induced gastritis 07/10/2018   DDD (degenerative disc disease), lumbosacral 04/22/2018   Disease related peripheral neuropathy 11/13/2017   Gastroesophageal reflux disease without esophagitis 11/13/2017   Occipital  headache (Bilateral) 11/13/2017   Cervicogenic headache (Bilateral) (L>R) 11/13/2017   History of postoperative nausea 10/22/2017   Chronic shoulder pain (5th area of Pain) (Bilateral) (L>R) 10/09/2017   Ankle joint instability (Left) 10/09/2017   Ankle sprain, sequela (Left) 10/09/2017   History of psychiatric symptoms 10/09/2017   Chronic pain syndrome 10/02/2017   Spondylosis without myelopathy or radiculopathy, lumbosacral region 10/02/2017   Chronic musculoskeletal pain 10/02/2017   Elevated C-reactive protein (CRP) 09/10/2017   Elevated sed rate 09/10/2017   Chronic neck pain (4th area of Pain) (Bilateral) (L>R) 09/09/2017   Pharmacologic therapy 09/09/2017   Disorder of skeletal system 09/09/2017   Problems influencing health status 09/09/2017   Long term current use of opiate analgesic 09/09/2017   Tobacco use disorder 07/16/2017   Ventral hernia without obstruction or gangrene 06/25/2017   Strain of extensor muscle, fascia and tendon of left index finger at wrist and hand level, initial encounter 05/16/2017   Sepsis (HCC) 04/14/2017   Osteopenia 04/03/2017   Hypotension 09/17/2016   Contusion of knee (Left) 10/12/2015   Strain of knee (Left) 10/12/2015   Incidental lung nodule 04/28/2015   Chronic low back pain (1ry area of Pain) (Bilateral) (L>R) 03/28/2015   Chronic lower extremity pain (Referred) (2ry area of Pain) (Left) 03/28/2015   Abdominal wound dehiscence 03/28/2015   Encounter for pain management planning 03/28/2015   Morbid obesity (HCC) 03/28/2015   Abnormal CT scan, lumbar spine 03/28/2015   Lumbar facet hypertrophy 03/28/2015   Lumbar facet syndrome (Bilateral) (L>R) 03/28/2015   Lumbar foraminal stenosis (Bilateral) (L5-S1) 03/28/2015   Chronic ankle pain (3ry area of Pain) (Left) 03/28/2015   Neurogenic pain 03/28/2015   Neuropathic pain 03/28/2015   Myofascial pain 03/28/2015   History of suicide attempt 03/28/2015   PTSD (post-traumatic stress  disorder) 01/13/2015   Abnormal gait 12/15/2014   Congestive heart failure (HCC) 11/15/2014   Abdominal wall abscess 09/20/2014   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   Diabetes mellitus, type 2 (HCC) 08/13/2014   Bipolar affective disorder (HCC) 08/13/2014   Type 2 diabetes mellitus with hyperlipidemia (HCC) 08/13/2014   Rectal prolapse 08/09/2014   Rectal bleeding 08/09/2014   Rectal bleed 08/09/2014   Affective bipolar disorder (HCC) 08/05/2014   Arteriosclerosis of coronary artery 08/05/2014   CCF (congestive cardiac failure) (HCC) 08/05/2014   Chronic kidney disease 08/05/2014   Detrusor muscle hypertonia 08/05/2014   Apnea, sleep 08/05/2014   Temporary cerebral vascular dysfunction 08/05/2014   Polypharmacy 04/29/2014   Other long term (current) drug therapy 04/29/2014   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   Chronic kidney disease, stage III (moderate) (HCC) 12/14/2013   Controlled diabetes mellitus type II without complication (HCC) 12/03/2013   Essential (primary) hypertension 12/03/2013   Adult hypothyroidism 12/03/2013   Controlled type 2 diabetes mellitus without complication (HCC) 12/03/2013    Past Surgical History:  Procedure Laterality  Date   ABDOMINAL HYSTERECTOMY     BIOPSY  09/19/2022   Procedure: BIOPSY;  Surgeon: Toledo, Ladell POUR, MD;  Location: [facility];  Service: Gastroenterology;;   CARDIAC CATHETERIZATION  2015   COLONOSCOPY     ELBOW FRACTURE SURGERY Left    pin placed   ESOPHAGOGASTRODUODENOSCOPY     ESOPHAGOGASTRODUODENOSCOPY (EGD) WITH PROPOFOL  N/A 09/19/2022   Procedure: ESOPHAGOGASTRODUODENOSCOPY (EGD) WITH PROPOFOL ;  Surgeon: Toledo, Ladell POUR, MD;  Location: [facility];  Service: Gastroenterology;  Laterality: N/A;   HERNIA REPAIR  07/15/2017   [facility]   HERNIA REPAIR  04/10/2021   [facility]   hyoid surgery & excision lingual tonsil  12/2020   lumbar facet     several   MELANOMA EXCISION  03/2020   OUTSIDE OF RECTUM   prolapse rectum surgery N/A  08/2014   SPINAL CORD STIMULATOR REMOVAL N/A 11/07/2020   Procedure: REMOVAL OF THORACIC SPINAL CORD STIMULATOR & PULSE GENERATOR;  Surgeon: Bluford Standing, MD;  Location: [facility];  Service: Neurosurgery;  Laterality: N/A;   THORACIC LAMINECTOMY FOR SPINAL CORD STIMULATOR N/A 07/27/2019   Procedure: THORACIC SPINAL CORD STIMULATOR PLACEMENT WITH RIGHT FLANK PULSE GENERATOR;  Surgeon: Bluford Standing, MD;  Location: [facility];  Service: Neurosurgery;  Laterality: N/A;   TONSILLECTOMY     TOTAL ABDOMINAL HYSTERECTOMY W/ BILATERAL SALPINGOOPHORECTOMY  2005   TOTAL HIP ARTHROPLASTY Left 08/07/2021   Procedure: TOTAL HIP ARTHROPLASTY ANTERIOR APPROACH;  Surgeon: Leora Lynwood SAUNDERS, MD;  Location: [facility];  Service: Orthopedics;  Laterality: Left;    Current Outpatient Medications on File Prior to Visit  Medication Sig Dispense Refill   albuterol  (PROVENTIL ) (2.5 MG/3ML) 0.083% nebulizer solution Take 2.5 mg by nebulization every 6 (six) hours as needed for wheezing or shortness of breath.     aspirin  81 MG chewable tablet Chew 1 tablet (81 mg total) by mouth 2 (two) times daily. 60 tablet 0   atorvastatin  (LIPITOR) 40 MG tablet Take 40 mg by mouth at bedtime.     cholecalciferol  (VITAMIN D3) 25 MCG (1000 UNIT) tablet Take 1,000 Units by mouth daily.     clonazepam  (KLONOPIN ) 0.125 MG disintegrating tablet Take 0.125 mg by mouth 3 (three) times daily.     cyanocobalamin  1000 MCG tablet Take 1,000 mcg by mouth daily.     diclofenac  (VOLTAREN ) 75 MG EC tablet Take 75 mg by mouth 2 (two) times daily.     dicyclomine  (BENTYL ) 10 MG capsule Take 10 mg by mouth 3 (three) times daily as needed for spasms.     EPINEPHrine  0.3 mg/0.3 mL IJ SOAJ injection Inject 0.3 mg into the muscle as needed for anaphylaxis. 1 each 1   furosemide  (LASIX ) 80 MG tablet Take 80 mg by mouth 2 (two) times daily.     gabapentin  (NEURONTIN ) 600 MG tablet Take 800 mg by mouth 3 (three) times daily.     levocetirizine (XYZAL ) 5 MG tablet  Take 5 mg by mouth every evening.     levothyroxine  (SYNTHROID ) 200 MCG tablet Take 200 mcg by mouth daily before breakfast. 1 tablet everyday except for Sunday; only take 1/2 tablet (100 mcg) on Sunday.     meclizine  (ANTIVERT ) 25 MG tablet Take 1 tablet (25 mg total) by mouth 3 (three) times daily as needed for dizziness. 20 tablet 0   metoprolol  succinate (TOPROL -XL) 50 MG 24 hr tablet Take 75 mg by mouth 2 (two) times daily.     montelukast  (SINGULAIR ) 10 MG tablet Take 10 mg by  mouth at bedtime.     Multiple Vitamin (MULTIVITAMIN) tablet Take 1 tablet by mouth daily.     Multiple Vitamins-Iron (MULTIPLE VITAMIN/IRON PO) Take 1 tablet by mouth daily.     mupirocin  ointment (BACTROBAN ) 2 % Apply 1 Application topically 2 (two) times daily. 22 g 0   Omega-3 Fatty Acids (FISH OIL) 1000 MG CAPS Take 1 capsule by mouth daily.     ondansetron  (ZOFRAN -ODT) 4 MG disintegrating tablet Take 4 mg by mouth every 8 (eight) hours as needed for nausea or vomiting.     oxycodone -acetaminophen  (LYNOX) 5-300 MG tablet Take 1 tablet by mouth in the morning and at bedtime.     potassium chloride  SA (KLOR-CON ) 20 MEQ tablet Take 40 mEq by mouth 2 (two) times daily.     PROAIR  HFA 108 (90 Base) MCG/ACT inhaler Inhale 2 puffs into the lungs every 6 (six) hours as needed for wheezing or shortness of breath.     rOPINIRole  (REQUIP ) 1 MG tablet Take 1 mg by mouth 3 (three) times daily.     Semaglutide,0.25 or 0.5MG /DOS, (OZEMPIC, 0.25 OR 0.5 MG/DOSE,) 2 MG/1.5ML SOPN Inject into the skin once a week.     solifenacin (VESICARE) 5 MG tablet Take 5 mg by mouth daily.     SYMBICORT 160-4.5 MCG/ACT inhaler Inhale 2 puffs into the lungs 2 (two) times daily.     temazepam  (RESTORIL ) 7.5 MG capsule Take 7.5 mg by mouth at bedtime as needed for sleep.     topiramate  (TOPAMAX ) 50 MG tablet Take 50 mg by mouth 2 (two) times daily.     torsemide  (DEMADEX ) 20 MG tablet Take 20 mg by mouth daily.     vitamin C (ASCORBIC ACID ) 500  MG tablet Take 500 mg by mouth daily.     vitamin E  180 MG (400 UNITS) capsule Take 400 Units by mouth daily.     cariprazine  (VRAYLAR ) 3 MG capsule Take 3 mg by mouth at bedtime.     docusate sodium  (COLACE) 100 MG capsule Take 1 capsule (100 mg total) by mouth 2 (two) times daily. 30 capsule 0   empagliflozin  (JARDIANCE ) 10 MG TABS tablet Take 10 mg by mouth daily.     esomeprazole (NEXIUM) 40 MG capsule Take 40 mg by mouth at bedtime.     febuxostat  (ULORIC ) 40 MG tablet Take 40 mg by mouth daily.     HYDROcodone -acetaminophen  (NORCO) 7.5-325 MG tablet Take 1-2 tablets by mouth every 4 (four) hours as needed for severe pain or moderate pain (pain score 7-10). 60 tablet 0   rizatriptan  (MAXALT ) 10 MG tablet Take 10 mg by mouth as needed for migraine.     stavudine (ZERIT) 20 MG capsule Take 20 mg by mouth 2 (two) times daily.     No current facility-administered medications on file prior to visit.     Allergies  Allergen Reactions   Amoxicillin -Pot Clavulanate Diarrhea and Nausea Only   Diazepam Hives and Nausea And Vomiting   Ziprasidone Hcl Itching and Other (See Comments)    Confusion    Azithromycin  Hives   Divalproex  Sodium Hives   Levofloxacin Hives and Rash    1/31: would like to retry since not taking valium   Lisinopril Other (See Comments)    Medicine cause kidney failure   Metronidazole Hives    1/31: would like to retry since she is no longer taking valium   Sulfa Antibiotics Hives   Valproic Acid  Itching and Rash   Cephalexin   Hives   Ciprofloxacin Hives   Doxycycline  Diarrhea   Penicillins Hives    Has patient had a PCN reaction causing immediate rash, facial/tongue/throat swelling, SOB or lightheadedness with hypotension: No Has patient had a PCN reaction causing severe rash involving mucus membranes or skin necrosis: No Has patient had a PCN reaction that required hospitalization: No Has patient had a PCN reaction occurring within the last 10 years: No If all  of the above answers are NO, then may proceed with Cephalosporin use.     Social History   Occupational History    Comment: not employed  Tobacco Use   Smoking status: Former    Current packs/day: 0.00    Average packs/day: 0.5 packs/day    Types: Cigarettes    Quit date: 09/01/2018    Years since quitting: 5.5   Smokeless tobacco: Never   Tobacco comments:    Patient quit smoking 09/01/2018  Vaping Use   Vaping status: Never Used  Substance and Sexual Activity   Alcohol use: No    Alcohol/week: 0.0 standard drinks of alcohol   Drug use: No   Sexual activity: Not Currently    Family History  Problem Relation Age of Onset   Diabetes Mellitus II Mother    CAD Mother    Sleep apnea Mother    Osteoarthritis Mother    Osteoporosis Mother    Anxiety disorder Mother    Depression Mother    Bipolar disorder Mother    Bipolar disorder Father    Hypertension Father    Depression Father    Anxiety disorder Father    Post-traumatic stress disorder Sister     Immunization History  Administered Date(s) Administered   PFIZER Comirnaty(Gray Top)Covid-19 Tri-Sucrose Vaccine 09/16/2020   PFIZER(Purple Top)SARS-COV-2 Vaccination 04/15/2019, 05/06/2019, 11/30/2019   Pfizer(Comirnaty)Fall Seasonal Vaccine 12 years and older 02/05/2023    Objective:   Objective: There were no vitals filed for this visit.   Marie Clarke is a pleasant 54 y.o. female in NAD. AAO X 3.     Diabetic foot exam was performed with the following findings:   Intact posterior tibialis and dorsalis pedis pulses Vascular Examination: Capillary refill time immediate b/l. Vascular status intact b/l with palpable pedal pulses. Pedal hair present b/l. No pain with calf compression b/l. Skin temperature gradient WNL b/l. No cyanosis or clubbing b/l. No ischemia or gangrene noted b/l. No edema noted b/l LE.   Neurological Examination: Pt has subjective symptoms of neuropathy. Protective sensation decreased with  10 gram monofilament b/l.   Dermatological Examination: Pedal skin with normal turgor, texture and tone b/l.  No open wounds. No interdigital macerations.    Toenails 2-5 b/l thick, discolored, elongated with subungual debris and pain on dorsal palpation.    No hyperkeratotic nor porokeratotic lesions.   Anonychia noted bilateral great toes. Nailbed(s) epithelialized.    Musculoskeletal Examination: Muscle strength 5/5 to all lower extremity muscle groups bilaterally. HAV with bunion deformity noted b/l LE.   Radiographs: None  Assessment: 1. Pain due to onychomycosis of toenails of both feet   2. Diabetic peripheral neuropathy associated with type 2 diabetes mellitus (HCC)      ADA Risk Categorization: High Risk:  Patient has one or more of the following: Loss of protective sensation Absent pedal pulses Severe Foot deformity History of foot ulcer  Plan: All patient's and/or POA's questions/concerns addressed on today's visit. Toenails 2-5 bilaterally debrided in length and girth without incident. Monitor blood glucose per PCP/Endocrinologist's recommendations.Continue soft,  supportive shoe gear daily. Report any pedal injuries to medical professional. Call office if there are any questions/concerns. -Patient/POA to call should there be question/concern in the interim. - RTC 3 months for routine care  No follow-ups on file.  Prentice JINNY Ovens, DPM      [place] LOCATION: [address], [place] [address]                   Office [phone]   [facility] LOCATION: [address] [place], [place] [address] Office [phone] "

## 2024-06-15 ENCOUNTER — Ambulatory Visit: Admitting: Podiatry
